# Patient Record
Sex: Female | Born: 1960
Health system: Southern US, Community
[De-identification: ages and names within clinical notes are randomized; demographics above are authoritative.]

## PROBLEM LIST (undated history)

## (undated) ENCOUNTER — Emergency Department (HOSPITAL_COMMUNITY): Payer: Medicaid Other | Source: Home / Self Care

## (undated) DIAGNOSIS — E0590 Thyrotoxicosis, unspecified without thyrotoxic crisis or storm: Secondary | ICD-10-CM

## (undated) DIAGNOSIS — D219 Benign neoplasm of connective and other soft tissue, unspecified: Secondary | ICD-10-CM

## (undated) DIAGNOSIS — E7800 Pure hypercholesterolemia, unspecified: Secondary | ICD-10-CM

## (undated) DIAGNOSIS — I10 Essential (primary) hypertension: Secondary | ICD-10-CM

## (undated) DIAGNOSIS — Z9289 Personal history of other medical treatment: Secondary | ICD-10-CM

## (undated) DIAGNOSIS — Z9109 Other allergy status, other than to drugs and biological substances: Secondary | ICD-10-CM

## (undated) HISTORY — DX: Personal history of other medical treatment: Z92.89

---

## 1999-08-25 ENCOUNTER — Encounter: Payer: Self-pay | Admitting: Obstetrics & Gynecology

## 1999-08-25 ENCOUNTER — Inpatient Hospital Stay (HOSPITAL_COMMUNITY): Admission: AD | Admit: 1999-08-25 | Discharge: 1999-08-25 | Payer: Self-pay | Admitting: *Deleted

## 1999-08-30 ENCOUNTER — Inpatient Hospital Stay (HOSPITAL_COMMUNITY): Admission: AD | Admit: 1999-08-30 | Discharge: 1999-08-30 | Payer: Self-pay | Admitting: *Deleted

## 1999-09-25 ENCOUNTER — Ambulatory Visit (HOSPITAL_COMMUNITY): Admission: RE | Admit: 1999-09-25 | Discharge: 1999-09-25 | Payer: Self-pay | Admitting: *Deleted

## 1999-12-06 ENCOUNTER — Inpatient Hospital Stay (HOSPITAL_COMMUNITY): Admission: AD | Admit: 1999-12-06 | Discharge: 1999-12-06 | Payer: Self-pay | Admitting: *Deleted

## 2000-01-16 ENCOUNTER — Inpatient Hospital Stay (HOSPITAL_COMMUNITY): Admission: AD | Admit: 2000-01-16 | Discharge: 2000-01-16 | Payer: Self-pay | Admitting: *Deleted

## 2000-02-20 ENCOUNTER — Inpatient Hospital Stay (HOSPITAL_COMMUNITY): Admission: AD | Admit: 2000-02-20 | Discharge: 2000-02-23 | Payer: Self-pay | Admitting: *Deleted

## 2000-07-21 ENCOUNTER — Inpatient Hospital Stay (HOSPITAL_COMMUNITY): Admission: AD | Admit: 2000-07-21 | Discharge: 2000-07-21 | Payer: Self-pay | Admitting: *Deleted

## 2001-11-26 ENCOUNTER — Observation Stay (HOSPITAL_COMMUNITY): Admission: AD | Admit: 2001-11-26 | Discharge: 2001-11-26 | Payer: Self-pay | Admitting: Obstetrics

## 2001-11-26 ENCOUNTER — Encounter (INDEPENDENT_AMBULATORY_CARE_PROVIDER_SITE_OTHER): Payer: Self-pay

## 2002-09-07 ENCOUNTER — Encounter: Payer: Self-pay | Admitting: General Practice

## 2002-09-07 ENCOUNTER — Encounter: Admission: RE | Admit: 2002-09-07 | Discharge: 2002-09-07 | Payer: Self-pay | Admitting: General Practice

## 2003-01-10 ENCOUNTER — Emergency Department (HOSPITAL_COMMUNITY): Admission: EM | Admit: 2003-01-10 | Discharge: 2003-01-10 | Payer: Self-pay | Admitting: Emergency Medicine

## 2003-01-10 ENCOUNTER — Encounter: Payer: Self-pay | Admitting: Emergency Medicine

## 2003-04-07 ENCOUNTER — Emergency Department (HOSPITAL_COMMUNITY): Admission: EM | Admit: 2003-04-07 | Discharge: 2003-04-07 | Payer: Self-pay | Admitting: Emergency Medicine

## 2003-04-19 ENCOUNTER — Encounter: Payer: Self-pay | Admitting: Obstetrics and Gynecology

## 2003-04-19 ENCOUNTER — Ambulatory Visit (HOSPITAL_COMMUNITY): Admission: RE | Admit: 2003-04-19 | Discharge: 2003-04-19 | Payer: Self-pay | Admitting: Obstetrics and Gynecology

## 2003-05-04 ENCOUNTER — Other Ambulatory Visit: Admission: RE | Admit: 2003-05-04 | Discharge: 2003-05-04 | Payer: Self-pay | Admitting: Obstetrics and Gynecology

## 2004-05-04 ENCOUNTER — Other Ambulatory Visit: Admission: RE | Admit: 2004-05-04 | Discharge: 2004-05-04 | Payer: Self-pay | Admitting: Obstetrics and Gynecology

## 2004-05-16 ENCOUNTER — Ambulatory Visit (HOSPITAL_COMMUNITY): Admission: RE | Admit: 2004-05-16 | Discharge: 2004-05-16 | Payer: Self-pay | Admitting: Obstetrics and Gynecology

## 2004-08-05 ENCOUNTER — Emergency Department (HOSPITAL_COMMUNITY): Admission: EM | Admit: 2004-08-05 | Discharge: 2004-08-05 | Payer: Self-pay | Admitting: Emergency Medicine

## 2005-03-15 ENCOUNTER — Emergency Department (HOSPITAL_COMMUNITY): Admission: EM | Admit: 2005-03-15 | Discharge: 2005-03-15 | Payer: Self-pay | Admitting: Family Medicine

## 2005-05-25 ENCOUNTER — Ambulatory Visit (HOSPITAL_COMMUNITY): Admission: RE | Admit: 2005-05-25 | Discharge: 2005-05-25 | Payer: Self-pay | Admitting: Internal Medicine

## 2005-07-04 ENCOUNTER — Emergency Department (HOSPITAL_COMMUNITY): Admission: EM | Admit: 2005-07-04 | Discharge: 2005-07-04 | Payer: Self-pay | Admitting: Emergency Medicine

## 2006-03-02 ENCOUNTER — Inpatient Hospital Stay (HOSPITAL_COMMUNITY): Admission: AD | Admit: 2006-03-02 | Discharge: 2006-03-03 | Payer: Self-pay | Admitting: Obstetrics and Gynecology

## 2006-06-25 ENCOUNTER — Ambulatory Visit: Payer: Self-pay | Admitting: Family Medicine

## 2006-06-26 ENCOUNTER — Ambulatory Visit: Payer: Self-pay | Admitting: *Deleted

## 2006-07-26 ENCOUNTER — Ambulatory Visit: Payer: Self-pay | Admitting: Family Medicine

## 2006-09-13 ENCOUNTER — Ambulatory Visit: Payer: Self-pay | Admitting: Family Medicine

## 2007-04-01 ENCOUNTER — Ambulatory Visit: Payer: Self-pay | Admitting: Family Medicine

## 2007-04-04 ENCOUNTER — Ambulatory Visit (HOSPITAL_COMMUNITY): Admission: RE | Admit: 2007-04-04 | Discharge: 2007-04-04 | Payer: Self-pay | Admitting: Family Medicine

## 2007-04-08 ENCOUNTER — Encounter (INDEPENDENT_AMBULATORY_CARE_PROVIDER_SITE_OTHER): Payer: Self-pay | Admitting: Family Medicine

## 2007-07-23 ENCOUNTER — Encounter (INDEPENDENT_AMBULATORY_CARE_PROVIDER_SITE_OTHER): Payer: Self-pay | Admitting: *Deleted

## 2007-09-18 DIAGNOSIS — E0590 Thyrotoxicosis, unspecified without thyrotoxic crisis or storm: Secondary | ICD-10-CM | POA: Insufficient documentation

## 2007-09-18 DIAGNOSIS — E785 Hyperlipidemia, unspecified: Secondary | ICD-10-CM | POA: Insufficient documentation

## 2007-09-18 DIAGNOSIS — Z794 Long term (current) use of insulin: Secondary | ICD-10-CM

## 2007-09-18 DIAGNOSIS — I10 Essential (primary) hypertension: Secondary | ICD-10-CM

## 2007-09-18 DIAGNOSIS — E119 Type 2 diabetes mellitus without complications: Secondary | ICD-10-CM

## 2008-12-29 ENCOUNTER — Ambulatory Visit: Payer: Self-pay | Admitting: Family Medicine

## 2008-12-29 DIAGNOSIS — I10 Essential (primary) hypertension: Secondary | ICD-10-CM | POA: Insufficient documentation

## 2008-12-29 LAB — CONVERTED CEMR LAB
Blood Glucose, Fingerstick: 147
Hgb A1c MFr Bld: 7.7 %

## 2009-01-03 ENCOUNTER — Encounter (INDEPENDENT_AMBULATORY_CARE_PROVIDER_SITE_OTHER): Payer: Self-pay | Admitting: Family Medicine

## 2009-01-03 ENCOUNTER — Ambulatory Visit: Payer: Self-pay | Admitting: Internal Medicine

## 2009-01-03 LAB — CONVERTED CEMR LAB
Basophils Absolute: 0 10*3/uL (ref 0.0–0.1)
Basophils Relative: 0 % (ref 0–1)
CO2: 22 meq/L (ref 19–32)
Calcium: 10.2 mg/dL (ref 8.4–10.5)
Chloride: 105 meq/L (ref 96–112)
Eosinophils Absolute: 0.1 10*3/uL (ref 0.0–0.7)
Eosinophils Relative: 1 % (ref 0–5)
Glucose, Bld: 139 mg/dL — ABNORMAL HIGH (ref 70–99)
HCT: 41.4 % (ref 36.0–46.0)
Lymphocytes Relative: 47 % — ABNORMAL HIGH (ref 12–46)
MCV: 77.5 fL — ABNORMAL LOW (ref 78.0–100.0)
Monocytes Absolute: 0.5 10*3/uL (ref 0.1–1.0)
Monocytes Relative: 8 % (ref 3–12)
Neutro Abs: 2.8 10*3/uL (ref 1.7–7.7)
Sodium: 141 meq/L (ref 135–145)
TSH: <0.004 microintl units/mL — ABNORMAL LOW (ref 0.350–4.50)
WBC: 6.4 10*3/uL (ref 4.0–10.5)

## 2009-01-06 ENCOUNTER — Ambulatory Visit: Payer: Self-pay | Admitting: Family Medicine

## 2009-01-20 ENCOUNTER — Ambulatory Visit: Payer: Self-pay | Admitting: Nurse Practitioner

## 2009-02-01 ENCOUNTER — Encounter (INDEPENDENT_AMBULATORY_CARE_PROVIDER_SITE_OTHER): Payer: Self-pay | Admitting: Family Medicine

## 2009-02-11 ENCOUNTER — Encounter (INDEPENDENT_AMBULATORY_CARE_PROVIDER_SITE_OTHER): Payer: Self-pay | Admitting: Family Medicine

## 2009-03-08 ENCOUNTER — Emergency Department (HOSPITAL_COMMUNITY): Admission: EM | Admit: 2009-03-08 | Discharge: 2009-03-08 | Payer: Self-pay | Admitting: Emergency Medicine

## 2009-03-08 ENCOUNTER — Telehealth (INDEPENDENT_AMBULATORY_CARE_PROVIDER_SITE_OTHER): Payer: Self-pay | Admitting: Family Medicine

## 2009-12-29 ENCOUNTER — Telehealth: Payer: Self-pay | Admitting: Physician Assistant

## 2010-01-16 ENCOUNTER — Ambulatory Visit: Payer: Self-pay | Admitting: Physician Assistant

## 2010-01-16 ENCOUNTER — Ambulatory Visit (HOSPITAL_COMMUNITY): Admission: RE | Admit: 2010-01-16 | Discharge: 2010-01-16 | Payer: Self-pay | Admitting: Physician Assistant

## 2010-01-16 DIAGNOSIS — F329 Major depressive disorder, single episode, unspecified: Secondary | ICD-10-CM

## 2010-01-16 DIAGNOSIS — E049 Nontoxic goiter, unspecified: Secondary | ICD-10-CM | POA: Insufficient documentation

## 2010-01-16 LAB — CONVERTED CEMR LAB: Blood Glucose, Fingerstick: 159

## 2010-01-17 ENCOUNTER — Encounter: Payer: Self-pay | Admitting: Physician Assistant

## 2010-01-17 DIAGNOSIS — E041 Nontoxic single thyroid nodule: Secondary | ICD-10-CM | POA: Insufficient documentation

## 2010-01-17 LAB — CONVERTED CEMR LAB
Albumin: 4.2 g/dL (ref 3.5–5.2)
Alkaline Phosphatase: 96 units/L (ref 39–117)
BUN: 13 mg/dL (ref 6–23)
Basophils Absolute: 0 10*3/uL (ref 0.0–0.1)
Basophils Relative: 0 % (ref 0–1)
CO2: 22 meq/L (ref 19–32)
Calcium: 10.4 mg/dL (ref 8.4–10.5)
Chloride: 104 meq/L (ref 96–112)
Eosinophils Absolute: 0.1 10*3/uL (ref 0.0–0.7)
Hemoglobin: 13.8 g/dL (ref 12.0–15.0)
Hgb A1c MFr Bld: 8.1 % — ABNORMAL HIGH (ref 4.6–6.1)
Lymphocytes Relative: 38 % (ref 12–46)
Lymphs Abs: 2.7 10*3/uL (ref 0.7–4.0)
MCHC: 31.3 g/dL (ref 30.0–36.0)
MCV: 78.5 fL (ref 78.0–100.0)
Microalb, Ur: 7.02 mg/dL — ABNORMAL HIGH (ref 0.00–1.89)
Monocytes Relative: 8 % (ref 3–12)
Neutro Abs: 3.7 10*3/uL (ref 1.7–7.7)
Potassium: 4.6 meq/L (ref 3.5–5.3)
RBC: 5.62 M/uL — ABNORMAL HIGH (ref 3.87–5.11)
T3, Free: 7.6 pg/mL — ABNORMAL HIGH (ref 2.3–4.2)
WBC: 7.1 10*3/uL (ref 4.0–10.5)

## 2010-01-23 ENCOUNTER — Ambulatory Visit: Payer: Self-pay | Admitting: Physician Assistant

## 2010-01-26 ENCOUNTER — Encounter (INDEPENDENT_AMBULATORY_CARE_PROVIDER_SITE_OTHER): Payer: Self-pay | Admitting: *Deleted

## 2010-01-26 ENCOUNTER — Ambulatory Visit: Payer: Self-pay | Admitting: Physician Assistant

## 2010-01-30 ENCOUNTER — Ambulatory Visit: Payer: Self-pay | Admitting: Physician Assistant

## 2010-01-30 DIAGNOSIS — K219 Gastro-esophageal reflux disease without esophagitis: Secondary | ICD-10-CM | POA: Insufficient documentation

## 2010-01-30 LAB — CONVERTED CEMR LAB: Blood Glucose, Fingerstick: 127

## 2010-01-31 ENCOUNTER — Encounter (HOSPITAL_COMMUNITY): Admission: RE | Admit: 2010-01-31 | Discharge: 2010-04-05 | Payer: Self-pay | Admitting: Internal Medicine

## 2010-02-09 ENCOUNTER — Encounter: Payer: Self-pay | Admitting: Physician Assistant

## 2010-02-20 ENCOUNTER — Ambulatory Visit: Payer: Self-pay | Admitting: Physician Assistant

## 2010-02-20 DIAGNOSIS — F411 Generalized anxiety disorder: Secondary | ICD-10-CM | POA: Insufficient documentation

## 2010-03-17 ENCOUNTER — Encounter: Payer: Self-pay | Admitting: Physician Assistant

## 2010-03-27 ENCOUNTER — Encounter: Payer: Self-pay | Admitting: Physician Assistant

## 2010-03-27 ENCOUNTER — Telehealth: Payer: Self-pay | Admitting: Physician Assistant

## 2010-04-06 ENCOUNTER — Encounter (INDEPENDENT_AMBULATORY_CARE_PROVIDER_SITE_OTHER): Payer: Self-pay | Admitting: *Deleted

## 2010-04-10 ENCOUNTER — Ambulatory Visit: Payer: Self-pay | Admitting: Physician Assistant

## 2010-04-10 LAB — CONVERTED CEMR LAB: Blood Glucose, Fingerstick: 161

## 2010-04-12 LAB — CONVERTED CEMR LAB: Free T4: 1.94 ng/dL — ABNORMAL HIGH (ref 0.80–1.80)

## 2010-04-26 ENCOUNTER — Telehealth: Payer: Self-pay | Admitting: Physician Assistant

## 2010-04-28 ENCOUNTER — Encounter: Payer: Self-pay | Admitting: Physician Assistant

## 2010-05-10 ENCOUNTER — Telehealth (INDEPENDENT_AMBULATORY_CARE_PROVIDER_SITE_OTHER): Payer: Self-pay | Admitting: *Deleted

## 2010-05-10 ENCOUNTER — Ambulatory Visit: Payer: Self-pay | Admitting: Physician Assistant

## 2010-05-10 DIAGNOSIS — R002 Palpitations: Secondary | ICD-10-CM

## 2010-05-11 LAB — CONVERTED CEMR LAB
CO2: 22 meq/L (ref 19–32)
Calcium: 10.6 mg/dL — ABNORMAL HIGH (ref 8.4–10.5)
Cholesterol: 229 mg/dL — ABNORMAL HIGH (ref 0–200)
Free T4: 1.83 ng/dL — ABNORMAL HIGH (ref 0.80–1.80)
LDL Cholesterol: 161 mg/dL — ABNORMAL HIGH (ref 0–99)
Potassium: 4.1 meq/L (ref 3.5–5.3)
TSH: <0.004 microintl units/mL — ABNORMAL LOW (ref 0.350–4.500)
VLDL: 20 mg/dL (ref 0–40)

## 2010-05-15 ENCOUNTER — Telehealth: Payer: Self-pay | Admitting: Physician Assistant

## 2010-05-15 ENCOUNTER — Ambulatory Visit: Payer: Self-pay | Admitting: Physician Assistant

## 2010-05-15 DIAGNOSIS — R079 Chest pain, unspecified: Secondary | ICD-10-CM

## 2010-05-15 LAB — CONVERTED CEMR LAB: Blood Glucose, Fingerstick: 170

## 2010-05-17 ENCOUNTER — Telehealth: Payer: Self-pay | Admitting: Physician Assistant

## 2010-05-27 ENCOUNTER — Encounter: Payer: Self-pay | Admitting: Physician Assistant

## 2010-05-29 ENCOUNTER — Ambulatory Visit: Payer: Self-pay | Admitting: Physician Assistant

## 2010-06-09 ENCOUNTER — Encounter: Payer: Self-pay | Admitting: Physician Assistant

## 2010-06-09 LAB — CONVERTED CEMR LAB
ALT: 17 units/L
Alkaline Phosphatase: 87 units/L
BUN: 8 mg/dL
CO2: 26 meq/L
Chloride: 106 meq/L
Free T4: 1.4 ng/dL
GFR calc Af Amer: >60 mL/min
GFR calc non Af Amer: >60 mL/min
Glucose, Bld: 157 mg/dL
Potassium: 4.3 meq/L
Sodium: 141 meq/L
Total Bilirubin: 0.7 mg/dL

## 2010-06-14 ENCOUNTER — Ambulatory Visit: Payer: Self-pay | Admitting: Physician Assistant

## 2010-06-15 ENCOUNTER — Telehealth: Payer: Self-pay | Admitting: Physician Assistant

## 2010-06-15 LAB — CONVERTED CEMR LAB
Free T4: 1.82 ng/dL — ABNORMAL HIGH (ref 0.80–1.80)
Hemoglobin: 13.3 g/dL (ref 12.0–15.0)
Lymphocytes Relative: 44 % (ref 12–46)
Monocytes Relative: 9 % (ref 3–12)
Neutro Abs: 3.5 10*3/uL (ref 1.7–7.7)
Platelets: 333 10*3/uL (ref 150–400)
RDW: 14.3 % (ref 11.5–15.5)

## 2010-06-28 ENCOUNTER — Telehealth: Payer: Self-pay | Admitting: Physician Assistant

## 2010-07-04 ENCOUNTER — Encounter (INDEPENDENT_AMBULATORY_CARE_PROVIDER_SITE_OTHER): Payer: Self-pay | Admitting: *Deleted

## 2010-07-05 ENCOUNTER — Ambulatory Visit: Payer: Self-pay | Admitting: Physician Assistant

## 2010-07-05 ENCOUNTER — Telehealth: Payer: Self-pay | Admitting: Physician Assistant

## 2010-07-05 LAB — CONVERTED CEMR LAB: Blood Glucose, Fingerstick: 152

## 2010-07-07 ENCOUNTER — Encounter: Payer: Self-pay | Admitting: Physician Assistant

## 2010-10-18 ENCOUNTER — Telehealth (INDEPENDENT_AMBULATORY_CARE_PROVIDER_SITE_OTHER): Payer: Self-pay | Admitting: Internal Medicine

## 2010-10-31 ENCOUNTER — Encounter (INDEPENDENT_AMBULATORY_CARE_PROVIDER_SITE_OTHER): Payer: Self-pay | Admitting: Internal Medicine

## 2010-12-01 ENCOUNTER — Encounter: Payer: Self-pay | Admitting: Physician Assistant

## 2010-12-05 ENCOUNTER — Encounter (INDEPENDENT_AMBULATORY_CARE_PROVIDER_SITE_OTHER): Payer: Self-pay | Admitting: Internal Medicine

## 2010-12-05 ENCOUNTER — Ambulatory Visit: Admit: 2010-12-05 | Payer: Self-pay | Admitting: Internal Medicine

## 2010-12-05 ENCOUNTER — Encounter: Payer: Self-pay | Admitting: Internal Medicine

## 2010-12-05 ENCOUNTER — Telehealth (INDEPENDENT_AMBULATORY_CARE_PROVIDER_SITE_OTHER): Payer: Self-pay | Admitting: Internal Medicine

## 2010-12-05 DIAGNOSIS — E0520 Thyrotoxicosis with toxic multinodular goiter without thyrotoxic crisis or storm: Secondary | ICD-10-CM | POA: Insufficient documentation

## 2010-12-05 DIAGNOSIS — K089 Disorder of teeth and supporting structures, unspecified: Secondary | ICD-10-CM | POA: Insufficient documentation

## 2010-12-05 LAB — CONVERTED CEMR LAB: T3, Free: 8.8 pg/mL — ABNORMAL HIGH (ref 2.3–4.2)

## 2010-12-05 NOTE — Progress Notes (Signed)
Summary: Office Visit//DEPRESSION SCREENNING  Office Visit//DEPRESSION SCREENNING   Imported By: Arta Bruce 03/20/2010 16:28:46  _____________________________________________________________________  External Attachment:    Type:   Image     Comment:   External Document

## 2010-12-05 NOTE — Miscellaneous (Signed)
Summary: [facility]:  L eye partially assessable  Clinical Lists Changes  Observations: Added new observation of DIAB EYE EX: Left Eye Partially Assessable on [facility] (01/23/2010 23:02)

## 2010-12-05 NOTE — Letter (Signed)
Summary: REFERRAL//PSYCHOLOGY//APPT DATE & TIME  REFERRAL//PSYCHOLOGY//APPT DATE & TIME   Imported By: Arta Bruce 03/15/2010 14:47:08  _____________________________________________________________________  External Attachment:    Type:   Image     Comment:   External Document

## 2010-12-05 NOTE — Miscellaneous (Signed)
Summary: labs from [facility]  Clinical Lists Changes  Observations: Added new observation of TSH: 0.016 microintl units/mL (06/09/2010 15:31) Added new observation of T4, FREE: 1.4 ng/dL (47/82/9562 13:08) Added new observation of SGPT (ALT): 17 units/L (06/09/2010 15:31) Added new observation of SGOT (AST): 18 units/L (06/09/2010 15:31) Added new observation of ALK PHOS: 87 units/L (06/09/2010 15:31) Added new observation of BILI TOTAL: 0.7 mg/dL (65/78/4696 29:52) Added new observation of GFRAA: >60 mL/min (06/09/2010 15:31) Added new observation of GFR: >60 mL/min (06/09/2010 15:31) Added new observation of CREATININE: 0.6 mg/dL (84/13/2440 10:27) Added new observation of BUN: 8 mg/dL (25/36/6440 34:74) Added new observation of BG RANDOM: 157 mg/dL (25/95/6387 56:43) Added new observation of ANION GAP: 9  (06/09/2010 15:31) Added new observation of CO2 PLSM/SER: 26 meq/L (06/09/2010 15:31) Added new observation of CL SERUM: 106 meq/L (06/09/2010 15:31) Added new observation of K SERUM: 4.3 meq/L (06/09/2010 15:31) Added new observation of NA: 141 meq/L (06/09/2010 15:31)

## 2010-12-05 NOTE — Progress Notes (Signed)
  Phone Note Call from Patient   Summary of Call: pt says she needs a glucose meter, strips and lacets sent to [facility] pharmacy  Initial call taken by: [name],  July 05, 2010 3:21 PM  Follow-up for Phone Call        pt is aware Follow-up by: [name],  July 06, 2010 4:16 PM    New/Updated Medications: * GLUCOSE METER check sugars once daily * GLUCOSE METER STRIPS check sugar once daily * LANCETS check sugar once daily Prescriptions: LANCETS check sugar once daily  #30 x 11   Entered and Authorized by:   Tereso Newcomer PA-C   Signed by:   Tereso Newcomer PA-C on 07/05/2010   Method used:   Faxed to ...       [facility] (retail)       [address] [place], [place]  [address]       Ph: [phone]       Fax: [phone]   RxID:   [id number] GLUCOSE METER STRIPS check sugar once daily  #30 x 11   Entered and Authorized by:   Tereso Newcomer PA-C   Signed by:   Tereso Newcomer PA-C on 07/05/2010   Method used:   Faxed to ...       [facility] - [facility] (retail)       [address] [place], [place]  [address]       Ph: [phone]       Fax: [phone]   RxID:   [id number] GLUCOSE METER check sugars once daily  #1 x 0   Entered and Authorized by:   Tereso Newcomer PA-C   Signed by:   Tereso Newcomer PA-C on 07/05/2010   Method used:   Faxed to ...       [facility] - [facility] (retail)       [address] [place], [place]  [address]       Ph: [phone]       Fax: [phone]   RxID:   [id number]

## 2010-12-05 NOTE — Letter (Signed)
[facility]   Imported By: Arta Bruce 06/29/2010 11:42:17  _____________________________________________________________________  External Attachment:    Type:   Image     Comment:   External Document

## 2010-12-05 NOTE — Assessment & Plan Note (Signed)
Summary: Chest Wall Pain  Nurse Visit   Vital Signs:  Patient profile:   50 year old female Pulse rate:   66 / minute Pulse rhythm:   regular Resp:     24 per minute BP sitting:   154 / 96  (left arm)  Review of Systems       States has edema when she is on her feet a lot. General:  Complains of fatigue, malaise, sleep disorder, and sweats; Feels "like I've been running hot.". CV:  Complains of chest pain or discomfort and fatigue.  CC: Recurring chest pain Is Patient Diabetic? Yes Did you bring your meter with you today? No, is broken Pain Assessment Patient in pain? yes     Location: chest Intensity: 5 Type: aching Onset of pain  when she takes in a deep breath, started Friday night CBG Result 170 CBG Device ID Presto  Does patient need assistance? Functional Status Self care Ambulation Normal   Primary Care Provider:  Tereso Newcomer PA-C  CC:  Recurring chest pain.  History of Present Illness: Started Friday night, was in bed and felt an aching across right chest to left to mid-chest, did not radiate further.  Denies dizziness, can't remember if she was SOB.  Pain eased up, then she went to sleep.  Later that night, pain started again, same type of pain, same place.  Saturday morning, it "was a doozy", same type of pain, but stronger and in the same area.  Unsure of SOB due to intensity of pain -- 9 1/2 on pain scale.  Moving right arm increased pain. Did not seek treatment, thinking she might have strained a muscle.  As above per RN. She dev. right chest pain Sat night at rest.  Exertion actually makes better.  Notes pleuritic symptoms.  No syncope.  No dyspnea.  No assoc nausea or diaph.  NO radiating pain.  No cough.  Pain worse with arm movements and changes in positioning.  Better today.  Has not taken anything.  NO palps.  No indigestion.  No dysphagia.   Physical Exam  General:  alert, well-developed, well-nourished, and overweight-appearing.   Head:   normocephalic.   Mouth:  pharynx pink and moist.   Neck:  thyromegaly.  no jvd Lungs:  normal breath sounds, no crackles, and no wheezes.   Heart:  normal rate, regular rhythm, and no murmur.   Abdomen:  soft, non-tender, and no hepatomegaly.   Extremities:  no edema Neurologic:  alert & oriented X3 and cranial nerves II-XII intact.   Psych:  normally interactive.     Impression & Recommendations:  Problem # 1:  CHEST PAIN UNSPECIFIED (ICD-786.50) Assessment New  she seems to be describing chest wall pain however, with her risk factors, will check some labs today ECG without change check Trop I and DDimer if DDimer abnl:  send to ED for chest CT if Trop abnormal:  send to ED for serial markers and admxn if warranted if above normal; treast chest wall pain and follow up if no better or go to ED if worse  Orders: EKG w/ Interpretation (93000) T-Troponin I ([id number]) D-Dimer- [facility] ([id number])  Complete Medication List: 1)  Allegra 180 Mg Tabs (Fexofenadine hcl) .... Take one (1) tablet daily as needed for allergies 2)  Aspir-low 81 Mg Tbec (Aspirin) .... Take one (1) tablet each day 3)  Crestor 10 Mg Tabs (Rosuvastatin calcium) .... Take one (1) tablet by mouth every day 4)  Diovan 320  Mg Tabs (Valsartan) .... Take one (1) tablet each day 5)  Hydrochlorothiazide 25 Mg Tabs (Hydrochlorothiazide) .... Take one (1) tablet every morning 6)  Metformin Hcl 500 Mg Tb24 (Metformin hcl) .... Take 1 tablet by mouth two times a day 7)  Nasacort Aq 55 Mcg/act Aers (Triamcinolone acetonide(nasal)) .... Use one spray in each nostril once daily 8)  Norvasc 10 Mg Tabs (Amlodipine besylate) .... Take 1 tablet by mouth every morning 9)  Lopressor 50 Mg Tabs (Metoprolol tartrate) .... Take 2  tabs by mouth two times a day  for blood pressure (pharmacy note dose change) 10)  Flonase 50 Mcg/act Susp (Fluticasone propionate) .Marland Kitchen.. 1-2 sprays each nostril once daily (use for 3 weeks, then stop for  one week) 11)  Pepcid 20 Mg Tabs (Famotidine) .... Take 1 tablet by mouth two times a day 12)  Methimazole 10 Mg Tabs (Methimazole) .... Take 2 tablets by mouth three times a day 13)  Alprazolam 0.25 Mg Tabs (Alprazolam) .... Take 1 by mouth once daily as needed for anxiety 14)  Clonidine Hcl 0.2 Mg Tabs (Clonidine hcl) .... 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) 5)  Gerd  (ICD-530.81) 6)  Thyroid Nodule  (ICD-241.0) 7)  Depression  (ICD-311) 8)  Thyromegaly  (ICD-240.9) 9)  Family History Diabetes 1st Degree Relative  (ICD-V18.0) 10)  Hypertension  (ICD-401.9) 11)  Hyperthyroidism  (ICD-242.90) 12)  Allergic Rhinitis, Seasonal  (ICD-477.0) 13)  Hyperlipidemia  (ICD-272.4) 14)  Essential Hypertension, Benign  (ICD-401.1) 15)  Diabetes Mellitus, Type II  (ICD-250.00)  Allergies: No Known Drug Allergies  Past History:  Past Medical History: Last updated: 01/16/2010 Diabetes mellitus, type II Hypertension Hyperlipidemia left rotator cuff syndrome   Patient Instructions: 1)  We will call you about test results. 2)  Put heat on your chest two times a day for the next 2-3 days. 3)  Take Tylenol 500 mg 1-2 tabs every 6 hours as needed for pain. 4)  You may take Ibuprofen 200 mg 1-2 tabs every 12 hours as needed for the next 2-3 days if needed. 5)  Schedule follow up if pain not gone in the next 7-10 days. 6)  Go to the ED if worse.   Family History: Reviewed history from 01/16/2010 and no changes required. Family History Diabetes 1st degree relative - mom Heart rhythm issue (dad) Family History Hypertension - dad  Social History: Reviewed history from 01/16/2010 and no changes required. 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 Patient Level III [[address]] 4)  T-Troponin I [[id number]] 5)  D-Dimer- [facility] [[id number]]   EKG  Procedure date:  05/15/2010  Findings:      Normal sinus rhythm with rate of:  62 normal axis nonspecific STTW changes no change since prior tracing 05/10/2010   Appended Document: Chest Wall Pain Patient: Andrea [name] Note: All result statuses are Final unless otherwise noted.  Tests: (  1) Fibrin Derivatives D-Dimer (14782)  Fibrin Derivatives D-Dimer                        [H]  0.51 ug/mL-FEU              0.00-0.48     At the inhouse established cutoff value of 0.48 ug/mL FEU, this     methology has been documented in the literature to have a sensitivity     and negative predictive value of at least 98-99%.  The test result     should be correlated with an assessment of the clinical probability of     DVT/VTE.  Note: An exclamation mark (!) indicates a result that was not dispersed into the flowsheet. Document Creation Date: 05/16/2010 8:08 AM _______________________________________________________________________  (1) Order result status: Final Collection or observation date-time: 05/15/2010 21:54 Requested date-time: 05/15/2010 16:13 Receipt date-time: 05/15/2010 21:54 Reported date-time: 05/16/2010 08:10 Referring Physician:   Ordering Physician:  Alben Spittle [phone]) Specimen Source:  Source: Lajean Silvius Order Number: [id number] Lab site: [facility]     [address], [address]     [place]  [place]  [address]   -----------------  The following lab values were dispersed to the flowsheet with no units conversion:    Fibrin  Derivatives D-Dimer, 0.51 UG/ML-FEU, (F)  expected units: ug/mL FEU   Signed by Tereso Newcomer PA-C on 05/16/2010 at 9:10 AM  ________________________________________________________________________ low clinical suspicion for PE feel like this is essentially a negative result no further w/u necessary at this time Trop neg discussed lab results with patient Tereso Newcomer PA-C  May 16, 2010 9:10 AM    Clinical Lists Changes  Problems: Assessed CHEST PAIN UNSPECIFIED as comment only - DDimer 0.51 (cut off 0.48) low clinical suspicion feel this is essentially neg.  Trop neg  cont tx for chest wall pain         Impression & Recommendations:  Problem # 1:  CHEST PAIN UNSPECIFIED (ICD-786.50) DDimer 0.51 (cut off 0.48) low clinical suspicion feel this is essentially neg.  Trop neg  cont tx for chest wall pain  Complete Medication List: 1)  Allegra 180 Mg Tabs (Fexofenadine hcl) .... Take one (1) tablet daily as needed for allergies 2)  Aspir-low 81 Mg Tbec (Aspirin) .... Take one (1) tablet each day 3)  Crestor 10 Mg Tabs (Rosuvastatin calcium) .... Take one (1) tablet by mouth every day 4)  Diovan 320 Mg Tabs (Valsartan) .... Take one (1) tablet each day 5)  Hydrochlorothiazide 25 Mg Tabs (Hydrochlorothiazide) .... Take one (1) tablet every morning 6)  Metformin Hcl 500 Mg Tb24 (Metformin hcl) .... Take 1 tablet by mouth two times a day 7)  Nasacort Aq 55 Mcg/act Aers (Triamcinolone acetonide(nasal)) .... Use one spray in each nostril once daily 8)  Norvasc 10 Mg Tabs (Amlodipine besylate) .... Take 1 tablet by mouth every morning 9)  Lopressor 50 Mg Tabs (Metoprolol tartrate) .... Take 2  tabs by mouth two times a day  for blood pressure (pharmacy note dose change) 10)  Flonase 50 Mcg/act Susp (Fluticasone propionate) .Marland Kitchen.. 1-2 sprays each nostril once daily (use for 3 weeks, then stop for one week) 11)  Pepcid 20 Mg Tabs (Famotidine) .... Take 1 tablet by mouth two  times a day 12)  Methimazole 10 Mg Tabs (Methimazole) .... Take 2 tablets by mouth three times a day 13)  Alprazolam 0.25 Mg Tabs (Alprazolam) .... Take 1 by  mouth once daily as needed for anxiety 14)  Clonidine Hcl 0.2 Mg Tabs (Clonidine hcl) .... Take 1 tablet by mouth two times a day for blood pressure (pharmacy note dose increase)    Signed by Tereso Newcomer PA-C on 05/16/2010 at 9:13 AM

## 2010-12-05 NOTE — Letter (Signed)
Summary: *HSN Results Follow up  [facility]  [address] [place], [place] [address]   Phone: [phone]  Fax: [phone]      07/04/2010   AUDYN DIMERCURIO [address] [place], [place]  [address]   Dear  Ms. [name],                            ____S.Drinkard,FNP   ____D. Gore,FNP       ____B. McPherson,MD   ____V. Rankins,MD    ____E. Mulberry,MD    ____N. Daphine Deutscher, FNP  ____D. Reche Dixon, MD    ____K. Philipp Deputy, MD    ____Other     This letter is to inform you that your recent test(s):  _______Pap Smear    _______Lab Test     _______X-ray    _______ is within acceptable limits  _______ requires a medication change  _______ requires a follow-up lab visit  _______ requires a follow-up visit with your provider   Comments:  We have been trying to reach you.  Please give [place] a call at your earliest convenience.       _________________________________________________________ If you have any questions, please contact our office                     Sincerely,  [name] [facility]

## 2010-12-05 NOTE — Letter (Signed)
Summary: Work Excuse  [facility]  [address] [place], [place] [address]   Phone: [phone]  Fax: [phone]    Today's Date: January 26, 2010  Name of Patient: [name]  The above named patient had a medical visit today at: 10  am / pm.  Please take this into consideration when reviewing the time away from work/school.    Special Instructions:  [  ] None  [  ] To be off the remainder of today, returning to the normal work / school schedule tomorrow.  [  ] To be off until the next scheduled appointment on ______________________.  [ X ] Other __This patient is able to perform her job as usual and has no physcial limiations to her job____________________________________________________________ ________________________________________________________________________   Sincerely yours,   [name]

## 2010-12-05 NOTE — Assessment & Plan Note (Signed)
Summary: 6 WEEK FU ON DM/FASTING//KT   Vital Signs:  Patient profile:   50 year old female Height:      65 inches Weight:      284 pounds BMI:     47.43 Temp:     97.6 degrees F oral Pulse rate:   74 / minute Pulse rhythm:   regular Resp:     18 per minute BP sitting:   160 / 102  (left arm) Cuff size:   large  Vitals Entered By: [name] (April 10, 2010 10:23 AM)  Serial Vital Signs/Assessments:  Time      Position  BP       Pulse  Resp  Temp     By 11:11 AM            160/90                         Tereso Newcomer PA-C  CC: six week f/u..., Hypertension Management Is Patient Diabetic? Yes Pain Assessment Patient in pain? no      CBG Result 161  Does patient need assistance? Functional Status Self care Ambulation Normal   Primary Care Provider:  Tereso Newcomer PA-C  CC:  six week f/u... and Hypertension Management.  History of Present Illness: Here for f/u.  Unfortunately, daughter was murdered recently.  She had funeral yest.  Patient is doing ok.  Taking xanax still as needed for anxiety.  Still does not want to take Zoloft.  She has seen A. Vaughan in the past.  She would like to see her back.  She has missed some BP meds and other meds due to grieving.  Sugars have been running high and states BP today looks much better than it did last week.    DM:  A1C is now 7.5.  Looks much better.  Admits compliance with metformin.  Hyperthyroidism:  Sees endo. at [facility] 04/27/2010.  Now taking methimazole.  Denies palps, diarrhea, etc.  HTN:  As above.  Taking clonidine now.    Hypertension History:      She denies headache, chest pain, syncope, and side effects from treatment.        Positive major cardiovascular risk factors include diabetes, hyperlipidemia, and hypertension.  Negative major cardiovascular risk factors include female age less than 59 years old and non-tobacco-user status.     Current Medications (verified): 1)  Allegra 180 Mg Tabs (Fexofenadine Hcl)  .... Take One (1) Tablet Daily As Needed For Allergies 2)  Aspir-Low 81 Mg Tbec (Aspirin) .... Take One (1) Tablet Each Day 3)  Crestor 10 Mg Tabs (Rosuvastatin Calcium) .... Take One (1) Tablet By Mouth Every Day 4)  Diovan 320 Mg Tabs (Valsartan) .... Take One (1) Tablet Each Day 5)  Hydrochlorothiazide 25 Mg Tabs (Hydrochlorothiazide) .... Take One (1) Tablet Every Morning 6)  Metformin Hcl 500 Mg Tb24 (Metformin Hcl) .... Take Two (2) Tablets Each Morning 7)  Nasacort Aq 55 Mcg/act Aers (Triamcinolone Acetonide(Nasal)) .... Use One Spray in Each Nostril Once Daily 8)  Norvasc 10 Mg Tabs (Amlodipine Besylate) .... Take 1 Tablet By Mouth Every Morning 9)  Zoloft 50 Mg Tabs (Sertraline Hcl) .... Take 1 Tablet By Mouth Once A Day 10)  Lopressor 50 Mg Tabs (Metoprolol Tartrate) .... Take 2  Tabs By Mouth Two Times A Day  For Blood Pressure (Pharmacy Note Dose Change) 11)  Flonase 50 Mcg/act Susp (Fluticasone Propionate) .Marland Kitchen.. 1-2 Sprays Each  Nostril Once Daily (Use For 3 Weeks, Then Stop For One Week) 12)  Pepcid 20 Mg Tabs (Famotidine) .... Take 1 Tablet By Mouth Two Times A Day 13)  Methimazole 5 Mg Tabs (Methimazole) .... Take 1 Tablet By Mouth Once A Day For 3 Days, Then Increae To Take 1 Tablet By Mouth Two Times A Day 14)  Alprazolam 0.25 Mg Tabs (Alprazolam) .... Take 1 By Mouth Once Daily As Needed For Anxiety 15)  Clonidine Hcl 0.1 Mg Tabs (Clonidine Hcl) .... Take 1 Tablet By Mouth Two Times A Day For Blood Pressure  Allergies (verified): No Known Drug Allergies  Physical Exam  General:  alert, well-developed, and well-nourished.   Head:  normocephalic and atraumatic.   Lungs:  normal breath sounds.   Heart:  normal rate and regular rhythm.   Neurologic:  alert & oriented X3 and cranial nerves II-XII intact.   Psych:  normally interactive and good eye contact.     Impression & Recommendations:  Problem # 1:  HYPERTENSION (ICD-401.9) advised her to watch salt in diet missed  meds recently with death of daughter advised her to get back on track increase clonidine to 0.2 two times a day f/u in 1 month for blood pressure  Her updated medication list for this problem includes:    Diovan 320 Mg Tabs (Valsartan) .Marland Kitchen... Take one (1) tablet each day    Hydrochlorothiazide 25 Mg Tabs (Hydrochlorothiazide) .Marland Kitchen... Take one (1) tablet every morning    Norvasc 10 Mg Tabs (Amlodipine besylate) .Marland Kitchen... Take 1 tablet by mouth every morning    Lopressor 50 Mg Tabs (Metoprolol tartrate) .Marland Kitchen... Take 2  tabs by mouth two times a day  for blood pressure (pharmacy note dose change)    Clonidine Hcl 0.2 Mg Tabs (Clonidine hcl) .Marland Kitchen... Take 1 tablet by mouth two times a day for blood pressure (pharmacy note dose increase)  Problem # 2:  DIABETES MELLITUS, TYPE II (ICD-250.00)  A1C better try to change metformin to two times a day first could increase dose if stays above 7 f/u on DM in 3 mos  Her updated medication list for this problem includes:    Aspir-low 81 Mg Tbec (Aspirin) .Marland Kitchen... Take one (1) tablet each day    Diovan 320 Mg Tabs (Valsartan) .Marland Kitchen... Take one (1) tablet each day    Metformin Hcl 500 Mg Tb24 (Metformin hcl) .Marland Kitchen... 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 Take 2  tabs by mouth two times a day  for blood pressure (pharmacy note dose change)    Methimazole 5 Mg Tabs (Methimazole) .Marland Kitchen... Take 1 tablet by mouth once a day for 3 days, then increae to take 1 tablet by mouth two times a day  Orders: T-TSH (29528-41324) T-T4, Free [phone])  Problem # 4:  DEPRESSION (ICD-311) still not taking zoloft schedule f/u with LCSW with recent death of daughter if she  decides to take zoloft, she knows to schedule earlier f/u with me  Her updated medication list for this problem includes:    Zoloft 50 Mg Tabs (Sertraline hcl) .Marland Kitchen... Take 1 tablet by mouth once a day    Alprazolam 0.25 Mg Tabs (Alprazolam) .Marland Kitchen... Take  1 by mouth once daily as needed for anxiety  Complete Medication List: 1)  Allegra 180 Mg Tabs (Fexofenadine hcl) .... Take one (1) tablet daily as needed for allergies 2)  Aspir-low 81 Mg Tbec (Aspirin) .... Take one (1) tablet each day 3)  Crestor 10 Mg Tabs (Rosuvastatin calcium) .... Take one (1) tablet by mouth every day 4)  Diovan 320 Mg Tabs (Valsartan) .... Take one (1) tablet each day 5)  Hydrochlorothiazide 25 Mg Tabs (Hydrochlorothiazide) .... Take one (1) tablet every morning 6)  Metformin Hcl 500 Mg Tb24 (Metformin hcl) .... Take 1 tablet by mouth two times a day 7)  Nasacort Aq 55 Mcg/act Aers (Triamcinolone acetonide(nasal)) .... Use one spray in each nostril once daily 8)  Norvasc 10 Mg Tabs (Amlodipine besylate) .... Take 1 tablet by mouth every morning 9)  Zoloft 50 Mg Tabs (Sertraline hcl) .... Take 1 tablet by mouth once a day 10)  Lopressor 50 Mg Tabs (Metoprolol tartrate) .... Take 2  tabs by mouth two times a day  for blood pressure (pharmacy note dose change) 11)  Flonase 50 Mcg/act Susp (Fluticasone propionate) .Marland Kitchen.. 1-2 sprays each nostril once daily (use for 3 weeks, then stop for one week) 12)  Pepcid 20 Mg Tabs (Famotidine) .... Take 1 tablet by mouth two times a day 13)  Methimazole 5 Mg Tabs (Methimazole) .... Take 1 tablet by mouth once a day for 3 days, then increae to take 1 tablet by mouth two times a day 14)  Alprazolam 0.25 Mg Tabs (Alprazolam) .... Take 1 by mouth once daily as needed for anxiety 15)  Clonidine Hcl 0.2 Mg Tabs (Clonidine hcl) .... Take 1 tablet by mouth two times a day for blood pressure (pharmacy note dose increase)  Hypertension Assessment/Plan:      The patient's hypertensive risk group is  category C: Target organ damage and/or diabetes.  Today's blood pressure is 160/102.  Her blood pressure goal is < 130/80.  Patient Instructions: 1)  Schedule follow up with Ethelene Browns. 2)  Please schedule a follow-up appointment in 1 month with Shanna Un for blood pressure.  Come fasting so we can check cholesterol.  Do not eat or drink after midnight except water.  Go ahead and take your blood pressure medicines with water before coming to the appointment. 3)  Make a follow up appointment with me sooner if you decide to take the Zoloft. 4)  Make sure you go to the appointment at [facility] 04/27/2010. 5)  Change Metformin (Glucophage) to Take 1 tablet by mouth two times a day for diabetes. 6)  Increase Clonidine to 0.1 mg Take 2 tabs two times a day.  Your new prescription is for a higher dose . . . take that one as directed. 7)    Prescriptions: CLONIDINE HCL 0.2 MG TABS (CLONIDINE HCL) Take 1 tablet by mouth two times a day for blood pressure (pharmacy note dose increase)  #60 x 5   Entered and Authorized by:   Tereso Newcomer PA-C   Signed by:   Tereso Newcomer PA-C on 04/10/2010   Method used:   Faxed to ...       [facility] - Pharmac (retail)       [address] [place], [place]  [address]       Ph: [phone]       Fax: [phone]   RxID:   [id number]   Laboratory Results  Blood Tests     HGBA1C: 7.5%   (Normal Range: Non-Diabetic - 3-6%   Control Diabetic - 6-8%) CBG Random:: 161mg /dL

## 2010-12-05 NOTE — Letter (Signed)
Summary: TEST ORDER FORM/RADIOLOGY//APPT DATE & TIME  TEST ORDER FORM/RADIOLOGY//APPT DATE & TIME   Imported By: Arta Bruce 03/21/2010 11:01:14  _____________________________________________________________________  External Attachment:    Type:   Image     Comment:   External Document

## 2010-12-05 NOTE — Letter (Signed)
Summary: Out of Work  [facility]  [address] [place], [place] [address]   Phone: [phone]  Fax: [phone]    January 16, 2010   Employee:  DEVYNNE STURDIVANT    To Whom It May Concern:   For Medical reasons, please excuse the above named employee from work for the following dates:  Start:   01/16/2010  End:   01/18/2010  If you need additional information, please feel free to contact our office.         Sincerely,    Tereso Newcomer PA-C

## 2010-12-05 NOTE — Letter (Signed)
Summary: MAILED REQUESR=ED RECORDS TO [facility]//03/20/10  MAILED REQUESR=ED RECORDS TO [facility]//03/20/10   Imported By: Silvio Pate Stanislawscyk 03/17/2010 11:43:39  _____________________________________________________________________  External Attachment:    Type:   Image     Comment:   External Document

## 2010-12-05 NOTE — Progress Notes (Signed)
Summary: REFILLS  Phone Note Call from Patient Call back at [phone]   Reason for Call: Refill Medication Summary of Call: [facility] SAYS THAT SHE NEEDS HER HCTZ, NORVASC, METFORMIN,ALLEGRA, DIOVAN REFILLED. SHE IS IN THE PROCESS OF GETTING HER CARD RENEWED AT [facility]  Initial call taken by: Leodis Rains,  December 29, 2009 4:49 PM  Follow-up for Phone Call        forward to provider....  Follow-up by: [name],  December 29, 2009 5:05 PM  Additional Follow-up for Phone Call Additional follow up Details #1::        on your desk to fax needs f/u appt Additional Follow-up by: Brynda Rim,  January 03, 2010 5:31 PM    Additional Follow-up for Phone Call Additional follow up Details #2::    faxed...Marland KitchenMarland Kitchen pt is aware.Marland KitchenMarland KitchenMarland KitchenArmenia Shannon  January 04, 2010 10:57 AM   Prescriptions: NORVASC 10 MG TABS (AMLODIPINE BESYLATE) Take 1 tablet by mouth every morning  #30 x 0   Entered and Authorized by:   Tereso Newcomer PA-C   Signed by:   Tereso Newcomer PA-C on 01/03/2010   Method used:   Printed then faxed to ...       [facility] - Pharmac (retail)       [address] [place], [place]  [address]       Ph: [phone]       Fax: [phone]   RxID:   [id number] METFORMIN HCL 500 MG TB24 (METFORMIN HCL) TAKE TWO (2) TABLETS EACH MORNING  #60 x 0   Entered and Authorized by:   Tereso Newcomer PA-C   Signed by:   Tereso Newcomer PA-C on 01/03/2010   Method used:   Printed then faxed to ...       [facility] - Pharmac (retail)       [address] [place], [place]  [address]       Ph: [phone]       Fax: [phone]   RxID:   [id number] HYDROCHLOROTHIAZIDE 25 MG TABS (HYDROCHLOROTHIAZIDE) TAKE ONE (1) TABLET EVERY MORNING  #30 x 0   Entered and Authorized by:   Tereso Newcomer PA-C   Signed by:   Tereso Newcomer PA-C on 01/03/2010   Method used:   Printed then faxed to ...       [facility] - Pharmac (retail)       [address] [place], [place]  [address]       Ph: [phone]       Fax: [phone]   RxID:   [id number] DIOVAN 320 MG TABS (VALSARTAN) TAKE ONE (1) TABLET EACH DAY  #30 x 0   Entered and Authorized by:   Tereso Newcomer PA-C   Signed by:   Tereso Newcomer PA-C on 01/03/2010   Method used:   Printed then faxed to ...       [facility] - Pharmac (retail)       [address] [place], [place]  [address]       Ph: [phone]       Fax: [phone]   RxID:   [id number] ALLEGRA 180 MG TABS (FEXOFENADINE HCL) TAKE ONE (1) TABLET DAILY AS NEEDED FOR ALLERGIES  #30 x 0   Entered and Authorized by:  Tereso Newcomer PA-C   Signed by:   Tereso Newcomer PA-C on 01/03/2010   Method used:   Printed then faxed to ...       [facility] - Pharmac (retail)       [address] [place], [place]  [address]       Ph: [phone]       Fax: [phone]   RxID:   [id number]

## 2010-12-05 NOTE — Letter (Signed)
Summary: Handout Printed  Printed Handout:  - Diet - Sodium-Controlled 

## 2010-12-05 NOTE — Assessment & Plan Note (Signed)
Summary: Andrea [name] PT/ ESTAB W/Malaina Mortellaro///KT   Vital Signs:  Patient profile:   50 year old female Height:      65 inches Weight:      290 pounds BMI:     48.43 Temp:     97.9 degrees F oral Pulse rate:   105 / minute Pulse rhythm:   regular Resp:     18 per minute BP sitting:   209 / 112  (left arm) Cuff size:   large  Vitals Entered By: [name] (January 16, 2010 9:41 AM)  Serial Vital Signs/Assessments:  Time      Position  BP       Pulse  Resp  Temp     By 10:39 AM            200/102                        Tereso Newcomer PA-C  CC: establish with provider......, Hypertension Management Is Patient Diabetic? Yes Pain Assessment Patient in pain? no      CBG Result 159  Does patient need assistance? Functional Status Self care Ambulation Normal   Primary Care Provider:  Tereso Newcomer PA-C  CC:  establish with provider...... and Hypertension Management.  History of Present Illness: Here to est with me.  Previously saw Dr. Barbaraann [name] (Dr. Hal Hope before that).  BP:  Just started back on meds last week.  No meds in 2 mos.  She rushed in to get here today and just took meds before I walked into the room.    DM:  Takes metformin.  Sugars at home 130-140; highest 237.  Just started back on metformin last week.    High chol:  Out of crestor.  Depression:  Never on meds.  Feels depressed.  Also, feels angry.  Feels like crying a lot.  No suicidal ideations.  Does have a sister with depression.  Has a hard time sleeping.   Hypertension History:      She complains of headache, but denies chest pain, dyspnea with exertion, neurologic problems, and syncope.        Positive major cardiovascular risk factors include diabetes, hyperlipidemia, and hypertension.  Negative major cardiovascular risk factors include female age less than 59 years old and non-tobacco-user status.     Habits & Providers  Alcohol-Tobacco-Diet     Alcohol drinks/day: <1     Tobacco Status: quit     Pack  years: < 1  Exercise-Depression-Behavior     Drug Use: no  Problems Prior to Update: 1)  Depression  (ICD-311) 2)  Thyromegaly  (ICD-240.9) 3)  Family History Diabetes 1st Degree Relative  (ICD-V18.0) 4)  Hypertension  (ICD-401.9) 5)  Hyperthyroidism  (ICD-242.90) 6)  Allergic Rhinitis, Seasonal  (ICD-477.0) 7)  Hyperlipidemia  (ICD-272.4) 8)  Essential Hypertension, Benign  (ICD-401.1) 9)  Diabetes Mellitus, Type II  (ICD-250.00)  Current Medications (verified): 1)  Allegra 180 Mg Tabs (Fexofenadine Hcl) .... Take One (1) Tablet Daily As Needed For Allergies 2)  Aspir-Low 81 Mg Tbec (Aspirin) .... Take One (1) Tablet Each Day 3)  Crestor 10 Mg Tabs (Rosuvastatin Calcium) .... Take One (1) Tablet By Mouth Every Day 4)  Diovan 320 Mg Tabs (Valsartan) .... Take One (1) Tablet Each Day 5)  Hydrochlorothiazide 25 Mg Tabs (Hydrochlorothiazide) .... Take One (1) Tablet Every Morning 6)  Metformin Hcl 500 Mg Tb24 (Metformin Hcl) .... Take Two (2) Tablets Each Morning 7)  Nasacort  Aq 55 Mcg/act Aers (Triamcinolone Acetonide(Nasal)) .... Use One Spray in Each Nostril Once Daily 8)  Norvasc 10 Mg Tabs (Amlodipine Besylate) .... 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 GU:  Denies hematuria. Endo:  Denies cold intolerance and  heat intolerance.  Physical Exam  General:  alert, well-developed, and well-nourished.   Head:  normocephalic and atraumatic.   Eyes:  pupils equal, pupils round, and pupils reactive to light.   Neck:  supple, no carotid bruits, and thyromegaly.   carotid pulses bounding Lungs:  normal breath sounds, no crackles, and no wheezes.   Heart:  normal rate and regular rhythm.   Abdomen:  soft, non-tender, and normal bowel sounds.   Msk:  feet without callus or ulcers  Pulses:  DP/PT 2+ bilat Neurologic:  alert & oriented X3 and cranial nerves II-XII intact.   Psych:  good eye contact and slightly anxious.    Diabetes Management Exam:    Foot Exam (with socks and/or shoes not present):       Sensory-Monofilament:          Left foot: normal          Right foot: normal   Impression & Recommendations:  Problem # 1:  HYPERTENSION (ICD-401.9) out of control just started meds back last week but needs better control start lopressor 50 mg two times a day  Her updated medication list for this problem includes:    Diovan 320 Mg Tabs (Valsartan) .Marland Kitchen... Take one (1) tablet each day    Hydrochlorothiazide 25 Mg Tabs (Hydrochlorothiazide) .Marland Kitchen... Take one (1) tablet every morning    Norvasc 10 Mg Tabs (Amlodipine besylate) .Marland Kitchen... Take 1 tablet by mouth every morning    Lopressor 50 Mg Tabs (Metoprolol tartrate) .Marland Kitchen... Take 1 tablet by mouth two times a day for blood pressure  Problem # 2:  DIABETES MELLITUS, TYPE II (ICD-250.00)  check labs doubt A1C will be controlled as just started meds last week  Her updated medication list for this problem includes:    Aspir-low 81 Mg Tbec (Aspirin) .Marland Kitchen... Take one (1) tablet each day    Diovan 320 Mg Tabs (Valsartan) .Marland Kitchen... Take one (1) tablet each day    Metformin Hcl 500 Mg Tb24 (Metformin hcl) .Marland Kitchen... Take two (2) tablets each morning  Orders: T-Comprehensive Metabolic Panel ([address]) T-Urine Microalbumin w/creat. ratio ([phone]) T-  Hemoglobin A1C (21308-65784)  Problem # 3:  HYPERLIPIDEMIA (ICD-272.4) refill crestor check FLP in 3 mos  Her updated medication list for this problem includes:    Crestor 10 Mg Tabs (Rosuvastatin calcium) .Marland Kitchen... Take one (1) tablet by mouth every day  Problem # 4:  THYROMEGALY (ICD-240.9)  check ultrasound check TFTs  Orders: T-TSH ([phone]) T-T4, Free (69629-52841) T-T3 by RIA ([id number]) Ultrasound (Ultrasound)  Problem # 5:  DEPRESSION (ICD-311)  with anxiety component open  to seeing LCSW also would like to try med start zoloft  Orders: Psychology Referral (Psychology) T-CBC w/Diff [phone])  Her updated medication list for this problem includes:    Zoloft 50 Mg Tabs (Sertraline hcl) .Marland Kitchen... Take 1 tablet by mouth once a day  Complete Medication List: 1)  Allegra 180 Mg Tabs (Fexofenadine hcl) .... Take one (1) tablet daily as needed for allergies 2)  Aspir-low 81 Mg Tbec (Aspirin) .... Take one (1) tablet each day 3)  Crestor 10 Mg Tabs (Rosuvastatin calcium) .... Take one (1) tablet by mouth every day 4)  Diovan 320 Mg Tabs (Valsartan) .... Take one (1) tablet each day 5)  Hydrochlorothiazide 25 Mg Tabs (Hydrochlorothiazide) .... Take one (1) tablet every morning 6)  Metformin Hcl 500 Mg Tb24 (Metformin hcl) .... Take two (2) tablets each morning 7)  Nasacort Aq 55 Mcg/act Aers (Triamcinolone acetonide(nasal)) .... Use one spray in each nostril once daily 8)  Norvasc 10 Mg Tabs (Amlodipine besylate) .... Take 1 tablet by mouth every morning 9)  Zoloft 50 Mg Tabs (Sertraline hcl) .... Take 1 tablet by mouth once a day 10)  Lopressor 50 Mg Tabs (Metoprolol tartrate) .... Take 1 tablet by mouth two times a day for blood pressure  Other Orders: EKG w/ Interpretation (93000)  Hypertension Assessment/Plan:      The patient's hypertensive risk group is category C: Target organ damage and/or diabetes.  Today's blood pressure is 209/112.  Her blood pressure goal  is < 130/80.  Patient Instructions: 1)  Schedule retasure at [facility]. 2)  Return in one week for blood pressure and pulse check with the nurse. 3)  Follow up with Shantell Belongia in 2 weeks for 311 and blood pressure. 4)  Schedule appt with Ethelene Browns. 5)  Zoloft, metoprolol and Crestor have been faxed to the [facility] pharmacy. Prescriptions: LOPRESSOR 50 MG TABS (METOPROLOL TARTRATE) Take 1 tablet by mouth two times a day for blood pressure  #60 x 5   Entered and Authorized by:   Tereso Newcomer PA-C   Signed by:   Tereso Newcomer PA-C on 01/16/2010   Method used:   Faxed to ...       [facility] (retail)       [address] [place], [place]  [address]       Ph: [phone]       Fax: [phone]   RxID:   [id number] ZOLOFT 50 MG TABS (SERTRALINE HCL) Take 1 tablet by mouth once a day  #30 x 3   Entered and Authorized by:   Tereso Newcomer PA-C   Signed by:   Tereso Newcomer PA-C on 01/16/2010   Method used:   Faxed to ...       [facility] - [facility] (retail)       [address] [place], [place]  [address]       Ph: [phone]       Fax: [phone]   RxID:   [id number] CRESTOR 10 MG TABS (ROSUVASTATIN CALCIUM) TAKE ONE (1) TABLET BY MOUTH EVERY DAY  #30 x 5   Entered and Authorized by:   Tereso Newcomer PA-C   Signed by:   Tereso Newcomer PA-C on 01/16/2010   Method used:   Faxed to ...       [facility] - [facility] (retail)  [address] [place], [place]  [address]       Ph: [phone]       Fax: [phone]   RxID:   [id number]          Diabetic Foot Exam Last Podiatry Exam Date: 01/16/2010  Foot Inspection Is there a history of a foot ulcer?              No Is there a foot ulcer now?              No Are the shoes appropriate in style and fit?          Yes Is there swelling or an abnormal foot shape?          No Are the toenails  long?                No Are the toenails thick?                No Are the toenails ingrown?              No Is there heavy callous build-up?              No Is there a claw toe deformity?                          No Is there elevated skin temperature?            No Is there limited ankle dorsiflexion?            No Is there foot or ankle muscle weakness?            No Do you have pain in calf while walking?           No         10-g (5.07) Semmes-Weinstein Monofilament Test Performed by: [name]          Right Foot          Left Foot Visual Inspection               Test Control      normal         normal Site 1         normal         normal Site 2         normal         normal Site 3         normal         normal Site 4         normal         normal Site 5         normal         normal Site 6         normal         normal Site 7         normal         normal Site 8         normal         normal Site 9         normal         normal Site 10         normal         normal  Impression      normal  normal    EKG  Procedure date:  01/16/2010  Findings:      Normal sinus rhythm with rate of:  96 normal axis LVH ? LAE

## 2010-12-05 NOTE — Progress Notes (Signed)
Summary: Ophthalmology F/u  Phone Note Outgoing Call   Summary of Call: Patient was automatically referred to Ophthalmology after she had retasure. Please make sure she is getting an appointment. Initial call taken by: Brynda Rim,  Mar 27, 2010 11:04 PM  Follow-up for Phone Call        Left message on answering machine for pt to call back..     [name]  Mar 29, 2010 10:25 AM  Left message on answering machine for pt to call back.Marland KitchenMarland KitchenMarland KitchenArmenia Shannon   Mar 30, 2010 9:23 AM   Additional Follow-up for Phone Call Additional follow up Details #1::        Left message on answering machine for pt to call back.... will mail letter..[name]  April 06, 2010 9:23 AM

## 2010-12-05 NOTE — Assessment & Plan Note (Signed)
Summary: 2-3 WEEK FU ON BP///KT   Vital Signs:  Patient profile:   50 year old female Height:      65 inches Weight:      282 pounds BMI:     47.10 Temp:     97.6 degrees F oral Pulse rate:   67 / minute Pulse rhythm:   regular Resp:     20 per minute BP sitting:   172 / 107  (left arm) Cuff size:   large  Vitals Entered By: [name] (February 20, 2010 10:07 AM)  Serial Vital Signs/Assessments:  Time      Position  BP       Pulse  Resp  Temp     By 10:17 AM            160/100                        [name]  CC: Hypertension Management Is Patient Diabetic? No Pain Assessment Patient in pain? no       Does patient need assistance? Functional Status Self care Ambulation Normal   Primary Care Provider:  Tereso Newcomer PA-C  CC:  Hypertension Management.  History of Present Illness: Here for f/u.  HTN:  Thinks she missed her lopressor last night.  Walks to the clinic.  Was in an argument before leaving her house.  Thinks that caused it to be up.  Has had BP checked at work.  She is seeing readings of 140/96 to 160/90.    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  She picks up rx today.  No palps, irritability or diarrhea.  No throat pain.  Anxiety:  Under a lot of stress at work.  Never started zoloft.  Saw Marchelle Folks and was told she did not need to come back.  Had to use her sister's valium once and this helped.  GERD/Allergic Rhinitis:  Meds helped.  Cough much improved.  Hypertension History:      She denies headache, chest pain, and dyspnea with exertion.        Positive major cardiovascular risk factors include diabetes, hyperlipidemia, and hypertension.  Negative major cardiovascular risk factors include female age less than 76 years old and non-tobacco-user status.     Current Medications (verified): 1)  Allegra 180 Mg Tabs (Fexofenadine Hcl) .... Take One (1) Tablet Daily As Needed For Allergies 2)  Aspir-Low 81 Mg Tbec (Aspirin) ....  Take One (1) Tablet Each Day 3)  Crestor 10 Mg Tabs (Rosuvastatin Calcium) .... Take One (1) Tablet By Mouth Every Day 4)  Diovan 320 Mg Tabs (Valsartan) .... Take One (1) Tablet Each Day 5)  Hydrochlorothiazide 25 Mg Tabs (Hydrochlorothiazide) .... Take One (1) Tablet Every Morning 6)  Metformin Hcl 500 Mg Tb24 (Metformin Hcl) .... Take Two (2) Tablets Each Morning 7)  Nasacort Aq 55 Mcg/act Aers (Triamcinolone Acetonide(Nasal)) .... Use One Spray in Each Nostril Once Daily 8)  Norvasc 10 Mg Tabs (Amlodipine Besylate) .... Take 1 Tablet By Mouth Every Morning 9)  Zoloft 50 Mg Tabs (Sertraline Hcl) .... Take 1 Tablet By Mouth Once A Day 10)  Lopressor 50 Mg Tabs (Metoprolol Tartrate) .... Take 2  Tabs By Mouth Two Times A Day  For Blood Pressure (Pharmacy Note Dose Change) 11)  Flonase 50 Mcg/act Susp (Fluticasone Propionate) .Marland Kitchen.. 1-2 Sprays Each Nostril Once Daily (Use For 3 Weeks, Then Stop For One Week) 12)  Pepcid 20 Mg  Tabs (Famotidine) .... Take 1 Tablet By Mouth Two Times A Day 13)  Methimazole 5 Mg Tabs (Methimazole) .... Take 1 Tablet By Mouth Once A Day For 3 Days, Then Increae To Take 1 Tablet By Mouth Two Times A Day  Allergies (verified): No Known Drug Allergies  Physical Exam  General:  alert, well-developed, and well-nourished.   Head:  normocephalic and atraumatic.   Neck:  thyromegaly.   Lungs:  normal breath sounds, no crackles, and no wheezes.   Heart:  normal rate and regular rhythm.   Extremities:  no edema Neurologic:  alert & oriented X3 and cranial nerves II-XII intact.   Psych:  normally interactive, good eye contact, and not depressed appearing.     Impression & Recommendations:  Problem # 1:  ANXIETY (ICD-300.00) never took zoloft d/w patient how to take xanax and she understands encouraged her to take zoloft but notify me if she does.  Her updated medication list for this problem includes:    Zoloft 50 Mg Tabs (Sertraline hcl) .Marland Kitchen... Take 1 tablet by  mouth once a day    Alprazolam 0.25 Mg Tabs (Alprazolam) .Marland Kitchen... Take 1 by mouth once daily as needed for anxiety  Problem # 2:  HYPERTENSION (ICD-401.9) still uncontrolled add clonidine  Her updated medication list for this problem includes:    Diovan 320 Mg Tabs (Valsartan) .Marland Kitchen... Take one (1) tablet each day    Hydrochlorothiazide 25 Mg Tabs (Hydrochlorothiazide) .Marland Kitchen... Take one (1) tablet every morning    Norvasc 10 Mg Tabs (Amlodipine besylate) .Marland Kitchen... Take 1 tablet by mouth every morning    Lopressor 50 Mg Tabs (Metoprolol tartrate) .Marland Kitchen... Take 2  tabs by mouth two times a day  for blood pressure (pharmacy note dose change)    Clonidine Hcl 0.1 Mg Tabs (Clonidine hcl) .Marland Kitchen... Take 1 tablet by mouth two times a day for blood pressure  Problem # 3:  Preventive Health Care (ICD-V70.0) pneumovax and Td today  Problem # 4:  HYPERTHYROIDISM (ICD-242.90) to start on methimazole today repeat labs in 2 weeks  Her updated medication list for this problem includes:    Lopressor 50 Mg Tabs (Metoprolol tartrate) .Marland Kitchen... Take 2  tabs by mouth two times a day  for blood pressure (pharmacy note dose change)    Methimazole 5 Mg Tabs (Methimazole) .Marland Kitchen... Take 1 tablet by mouth once a day for 3 days, then increae to take 1 tablet by mouth two times a day  Complete Medication List: 1)  Allegra 180 Mg Tabs (Fexofenadine hcl) .... Take one (1) tablet daily as needed for allergies 2)  Aspir-low 81 Mg Tbec (Aspirin) .... Take one (1) tablet each day 3)  Crestor 10 Mg Tabs (Rosuvastatin calcium) .... Take one (1) tablet by mouth every day 4)  Diovan 320 Mg Tabs (Valsartan) .... Take one (1) tablet each day 5)  Hydrochlorothiazide 25 Mg Tabs (Hydrochlorothiazide) .... Take one (1) tablet every morning 6)  Metformin Hcl 500 Mg Tb24 (Metformin hcl) .... Take two (2) tablets each morning 7)  Nasacort Aq 55 Mcg/act Aers (Triamcinolone acetonide(nasal)) .... Use one spray in each nostril once daily 8)  Norvasc 10 Mg  Tabs (Amlodipine besylate) .... Take 1 tablet by mouth every morning 9)  Zoloft 50 Mg Tabs (Sertraline hcl) .... Take 1 tablet by mouth once a day 10)  Lopressor 50 Mg Tabs (Metoprolol tartrate) .... Take 2  tabs by mouth two times a day  for blood pressure (pharmacy note dose change)  11)  Flonase 50 Mcg/act Susp (Fluticasone propionate) .Marland Kitchen.. 1-2 sprays each nostril once daily (use for 3 weeks, then stop for one week) 12)  Pepcid 20 Mg Tabs (Famotidine) .... Take 1 tablet by mouth two times a day 13)  Methimazole 5 Mg Tabs (Methimazole) .... Take 1 tablet by mouth once a day for 3 days, then increae to take 1 tablet by mouth two times a day 14)  Alprazolam 0.25 Mg Tabs (Alprazolam) .... Take 1 by mouth once daily as needed for anxiety 15)  Clonidine Hcl 0.1 Mg Tabs (Clonidine hcl) .... Take 1 tablet by mouth two times a day for blood pressure  Other Orders: Tdap => 52yrs IM (66440) Pneumococcal Vaccine (34742) Admin 1st Vaccine (59563) Admin of Any Addtl Vaccine (87564) Admin 1st Vaccine (State) [phone]) Admin of Any Addtl Vaccine (State) (88416S)  Hypertension Assessment/Plan:      The patient's hypertensive risk group is category C: Target organ damage and/or diabetes.  Today's blood pressure is 172/107.  Her blood pressure goal is < 130/80.  Patient Instructions: 1)  Pneumovax and Td shot today. 2)  Take xanax only as needed. 3)  Let me know if you decide to start the Zoloft. 4)  Start the Methimazole today. 5)  Lab visit in 2 weeks: TSH and Free T4 (242.9) 6)  Schedule follow up with Suhailah Kwan in 6 weeks for blood pressure and diabetes (mid-June).  Come fasting for labs (nothing to eat or drink after midnight the night before except water). 7)  Call me if you have not heard about an appointment with Endocrinology in 2 weeks. Prescriptions: CLONIDINE HCL 0.1 MG TABS (CLONIDINE HCL) Take 1 tablet by mouth two times a day for blood pressure  #60 x 5   Entered and Authorized by:   Tereso Newcomer PA-C   Signed by:   Tereso Newcomer PA-C on 02/20/2010   Method used:   Print then Give to Patient   RxID:   [id number] ALPRAZOLAM 0.25 MG TABS (ALPRAZOLAM) Take 1 by mouth once daily as needed for anxiety  #30 x 0   Entered and Authorized by:   Tereso Newcomer PA-C   Signed by:   Tereso Newcomer PA-C on 02/20/2010   Method used:   Print then Give to Patient   RxID:   [id number]    Tetanus/Td Vaccine    Vaccine Type: Tdap    Site: left deltoid    Mfr: Sanofi Pasteur    Dose: 0.5 ml    Route: IM    Given by: [name]    Exp. Date: 06/02/2012    Lot #: [id number]    VIS given: 09/23/07 version given February 20, 2010.  Pneumovax Vaccine    Vaccine Type: Pneumovax    Site: right deltoid    Mfr: Merck    Dose: 0.5 ml    Route: IM    Given by: [name]    Exp. Date: 09/04/2011    Lot #: [id number]    VIS given: 06/02/96 version given February 20, 2010.   Orders Added: 1)  Est. Patient Level III [10626] 2)  Tdap => 54yrs IM [90715] 3)  Pneumococcal Vaccine [90732] 4)  Admin 1st Vaccine [90471] 5)  Admin of Any Addtl Vaccine [90472] 6)  Admin 1st Vaccine [facility]) [94854O] 7)  Admin of Any Addtl Vaccine [facility]) [phone]

## 2010-12-05 NOTE — Letter (Signed)
Summary: PSYCHIATRIC ELVALUATION  PSYCHIATRIC ELVALUATION   Imported By: Arta Bruce 07/03/2010 10:15:06  _____________________________________________________________________  External Attachment:    Type:   Image     Comment:   External Document

## 2010-12-05 NOTE — Progress Notes (Signed)
Summary: Possible D/C  Phone Note Outgoing Call   Summary of Call: [place], Please contact patient regarding no show policy. Any additional no shows pt will be discharged from the practice. Initial call taken by: Hassell Halim CMA,  June 28, 2010 9:55 AM  Follow-up for Phone Call        Left message on answering machine for pt to call back.Marland KitchenMarland KitchenMarland KitchenArmenia Shannon  June 29, 2010 2:29 PM   Left message on answering machine for pt to call back.Marland KitchenMarland KitchenMarland KitchenArmenia Shannon  June 30, 2010 2:39 PM   Left message on answering machine for pt to call back.Marland Kitchen...will mail letter.... [name]  July 04, 2010 2:39 PM

## 2010-12-05 NOTE — Letter (Signed)
Summary: *HSN Results Follow up  [facility]  [address] [place], [place] [address]   Phone: [phone]  Fax: [phone]      04/06/2010   Andrea [name] [address] [place], [place]  [address]   Dear  Ms. [name],                            ____S.Drinkard,FNP   ____D. Gore,FNP       ____B. McPherson,MD   ____V. Rankins,MD    ____E. Mulberry,MD    ____N. Daphine Deutscher, FNP  ____D. Reche Dixon, MD    ____K. Philipp Deputy, MD    ____Other     This letter is to inform you that your recent test(s):  _______Pap Smear    _______Lab Test     _______X-ray    _______ is within acceptable limits  _______ requires a medication change  _______ requires a follow-up lab visit  _______ requires a follow-up visit with your provider   Comments:  We have been trying to reach you.  Please give the office a call at your earliest convenience.       _________________________________________________________ If you have any questions, please contact our office                     Sincerely,  [name] [facility]

## 2010-12-05 NOTE — Letter (Signed)
Summary: *Referral Letter  [facility]  [address] [place], [place] [address]   Phone: [phone]  Fax: [phone]    02/09/2010  Thank you in advance for agreeing to see my patient:  Andrea [name] [address] [place], [place]  [address]  Phone: [phone]  Reason for Referral: 50 yo female with suppressed TSH and elevated Free T4 and T3.  She had multiple large nodules noted on ultrasound.  Thyroid scan consistent with multiple hyperfunctioning nodules.  She has been placed on a beta blocker and I am starting methimazole.  Please evaluate.  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) 2)  GERD (ICD-530.81) 3)  THYROID NODULE (ICD-241.0) 4)  DEPRESSION (ICD-311) 5)  THYROMEGALY (ICD-240.9) 6)  FAMILY HISTORY DIABETES 1ST DEGREE RELATIVE (ICD-V18.0) 7)  HYPERTENSION (ICD-401.9) 8)  HYPERTHYROIDISM (ICD-242.90) 9)  ALLERGIC RHINITIS, SEASONAL (ICD-477.0) 10)  HYPERLIPIDEMIA (ICD-272.4) 11)  ESSENTIAL HYPERTENSION, BENIGN (ICD-401.1) 12)  DIABETES MELLITUS, TYPE II (ICD-250.00)   Current Medications: 1)  ALLEGRA 180 MG TABS (FEXOFENADINE HCL) TAKE ONE (1) TABLET DAILY AS NEEDED FOR ALLERGIES 2)  ASPIR-LOW 81 MG TBEC (ASPIRIN) TAKE ONE (1) TABLET EACH DAY 3)  CRESTOR 10 MG TABS (ROSUVASTATIN CALCIUM) TAKE ONE (1) TABLET BY MOUTH EVERY DAY 4)  DIOVAN 320 MG TABS (VALSARTAN) TAKE ONE (1) TABLET EACH DAY 5)  HYDROCHLOROTHIAZIDE 25 MG TABS (HYDROCHLOROTHIAZIDE) TAKE ONE (1) TABLET EVERY MORNING 6)  METFORMIN HCL 500 MG TB24 (METFORMIN HCL) TAKE TWO (2) TABLETS EACH MORNING 7)  NASACORT AQ 55 MCG/ACT AERS (TRIAMCINOLONE ACETONIDE(NASAL)) USE ONE SPRAY IN EACH NOSTRIL ONCE DAILY 8)  NORVASC 10 MG TABS (AMLODIPINE BESYLATE) Take 1 tablet by mouth every morning 9)  ZOLOFT 50 MG TABS (SERTRALINE HCL) Take 1 tablet by mouth once a day 10)  LOPRESSOR 50 MG TABS (METOPROLOL TARTRATE) Take 2  tabs by mouth two  times a day  for blood pressure (pharmacy note dose change) 11)  FLONASE 50 MCG/ACT SUSP (FLUTICASONE PROPIONATE) 1-2 sprays each nostril once daily (use for 3 weeks, then stop for one week) 12)  PEPCID 20 MG TABS (FAMOTIDINE) Take 1 tablet by mouth two times a day 13)  METHIMAZOLE 5 MG TABS (METHIMAZOLE) Take 1 tablet by mouth once a day for 3 days, then increae to Take 1 tablet by mouth two times a day   Past Medical History: 1)  Diabetes mellitus, type II 2)  Hypertension 3)  Hyperlipidemia 4)  left rotator cuff syndrome   Prior History of Blood Transfusions:   Pertinent Labs:    Thank you again for agreeing to see our patient; please contact us if you have any further questions or need additional information.  Sincerely,  Tereso Newcomer PA-C

## 2010-12-05 NOTE — Progress Notes (Signed)
Summary: DROPPED OFF FMLA PAPERS  Phone Note Call from Patient Call back at Home Phone [phone]   Summary of Call: Andrea [name] PT. Andrea [name] DROPPED OFF HER FAMILY MEDICAL LEAVE FORM FROM HER JOB TO BE FILLED OUT AND THEY TOLD HER THAT SHE WILL NEED IT BACK IN  ABOUT 4 DAYS. Initial call taken by: Andrea [name],  April 26, 2010 2:40 PM  Follow-up for Phone Call        Left message on answering machine for pt to call back.Marland KitchenMarland KitchenMarland KitchenArmenia [name]  April 28, 2010 12:02 PM   pt aware that papers are ready..... Andrea [name]  April 28, 2010 2:41 PM

## 2010-12-05 NOTE — Assessment & Plan Note (Signed)
Summary: 1 MONTH FU/////KT   Vital Signs:  Patient profile:   50 year old female Height:      65 inches Weight:      281 pounds BMI:     46.93 Temp:     97.4 degrees F oral Pulse rate:   76 / minute Pulse rhythm:   regular BP sitting:   187 / 120  (left arm) Cuff size:   large  Vitals Entered By: [name] (May 10, 2010 10:43 AM)  Serial Vital Signs/Assessments:  Time      Position  BP       Pulse  Resp  Temp     By 12:01 PM            132/78                         Tereso Newcomer PA-C 12:01 PM            138/80                         Tereso Newcomer PA-C  Comments: 12:01 PM left arm  By: Tereso Newcomer PA-C  12:01 PM right arm  By: Tereso Newcomer PA-C   CC: one month f/u..., Hypertension Management Is Patient Diabetic? No Pain Assessment Patient in pain? no      CBG Result 151  Does patient need assistance? Functional Status Self care Ambulation Normal   Primary Care Provider:  Tereso Newcomer PA-C  CC:  one month f/u... and Hypertension Management.  History of Present Illness: Here for followup.  Anxiety:  Has rough time now with recent murder of daughter.  Now in custody of her 2 children (ages 77 and 5).  Takes xanax infrequently.  Still does not want to take Zoloft.  Has appt with Marchelle Folks next week.  Encouraged her to go as I think she needs counseling with the current situation she is in.  If she decides to take Zoloft, I need to see her back closely.  Hyperthyroidism:  Missed appt at [facility].  Has appt rescheduled for next month.  Notes palps for about 1 week.  HR seems fast and lasts just a few seconds.  Makes her feel a little weak.  No syncope.  Feels lightheadedness.  Question if she has near syncope.  She does note some chest tightness.  Does feel shorth of breath.  No chest pain with exertion.  Does note DOE.  Maybe worse over 1 month.  NYHA class 2 (has to stop due to knee problems).  Sleeps on 2 pillows and has done so her whole life.  No PND.  No swelling  in legs noticed.  No diarrhea.  Is upset over her current social situation.  Hypertension History:      She complains of chest pain, palpitations, and dyspnea with exertion, but denies syncope.  Further comments include: see above.        Positive major cardiovascular risk factors include diabetes, hyperlipidemia, and hypertension.  Negative major cardiovascular risk factors include female age less than 64 years old and non-tobacco-user status.     Problems Prior to Update: 1)  Chest Pain Unspecified  (ICD-786.50) 2)  Palpitations  (ICD-785.1) 3)  Anxiety  (ICD-300.00) 4)  Goiter, Toxic, Multinodular  (ICD-242.20) 5)  Gerd  (ICD-530.81) 6)  Thyroid Nodule  (ICD-241.0) 7)  Depression  (ICD-311) 8)  Thyromegaly  (ICD-240.9) 9)  Family History  Diabetes 1st Degree Relative  (ICD-V18.0) 10)  Hypertension  (ICD-401.9) 11)  Hyperthyroidism  (ICD-242.90) 12)  Allergic Rhinitis, Seasonal  (ICD-477.0) 13)  Hyperlipidemia  (ICD-272.4) 14)  Essential Hypertension, Benign  (ICD-401.1) 15)  Diabetes Mellitus, Type II  (ICD-250.00)  Current Medications (verified): 1)  Allegra 180 Mg Tabs (Fexofenadine Hcl) .... Take One (1) Tablet Daily As Needed For Allergies 2)  Aspir-Low 81 Mg Tbec (Aspirin) .... Take One (1) Tablet Each Day 3)  Crestor 10 Mg Tabs (Rosuvastatin Calcium) .... Take One (1) Tablet By Mouth Every Day 4)  Diovan 320 Mg Tabs (Valsartan) .... Take One (1) Tablet Each Day 5)  Hydrochlorothiazide 25 Mg Tabs (Hydrochlorothiazide) .... Take One (1) Tablet Every Morning 6)  Metformin Hcl 500 Mg Tb24 (Metformin Hcl) .... Take 1 Tablet By Mouth Two Times A Day 7)  Nasacort Aq 55 Mcg/act Aers (Triamcinolone Acetonide(Nasal)) .... Use One Spray in Each Nostril Once Daily 8)  Norvasc 10 Mg Tabs (Amlodipine Besylate) .... Take 1 Tablet By Mouth Every Morning 9)  Zoloft 50 Mg Tabs (Sertraline Hcl) .... Take 1 Tablet By Mouth Once A Day 10)  Lopressor 50 Mg Tabs (Metoprolol Tartrate) .... Take 2   Tabs By Mouth Two Times A Day  For Blood Pressure (Pharmacy Note Dose Change) 11)  Flonase 50 Mcg/act Susp (Fluticasone Propionate) .Marland Kitchen.. 1-2 Sprays Each Nostril Once Daily (Use For 3 Weeks, Then Stop For One Week) 12)  Pepcid 20 Mg Tabs (Famotidine) .... Take 1 Tablet By Mouth Two Times A Day 13)  Methimazole 10 Mg Tabs (Methimazole) .... Take 1 Tablet By Mouth Two Times A Day 14)  Alprazolam 0.25 Mg Tabs (Alprazolam) .... Take 1 By Mouth Once Daily As Needed For Anxiety 15)  Clonidine Hcl 0.2 Mg Tabs (Clonidine Hcl) .... 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  Physical Exam  General:  alert, well-developed, and well-nourished.   Head:  normocephalic and atraumatic.   Neck:  thyromegaly.  no JVD.   Lungs:  normal breath sounds, no crackles, and no wheezes.   Heart:  normal rate and regular rhythm.   Abdomen:  soft.   Extremities:  no edema  Neurologic:  alert & oriented X3 and cranial nerves II-XII intact.   Skin:  warm and dry  Psych:  Oriented X3 and normally interactive.     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 Take 2  tabs by mouth two times a day  for blood pressure  (pharmacy note dose change)    Methimazole 10 Mg Tabs (Methimazole) .Marland Kitchen... Take 1 tablet by mouth two times a day  Orders: T-TSH (60454-09811) T-T4, Free [phone]) T-T3, Free [phone])  Problem # 2:  PALPITATIONS (ICD-785.1)  could be related to anxiety but, at  risk for AFib with hyperthyroidism contine beta blocker check ECG check Echo check 48 hour holter close f/u with me if continues to have symptoms with neg. workup by me, will need to see cardiology will make sure she continues ASA 81 mg once daily   Her updated medication list for this problem includes:    Lopressor 50 Mg Tabs (Metoprolol tartrate) .Marland Kitchen... Take 2  tabs by mouth two times a day  for blood pressure (pharmacy note dose change)  Orders: EKG w/ Interpretation (93000) 24 Hr Holter (24 Hr Holter) T-Basic Metabolic Panel (16109-60454) 2 D Echo (2 D Echo)  Problem # 3:  HYPERTHYROIDISM (ICD-242.90)  as above  Her updated medication list for this problem includes:    Lopressor 50 Mg Tabs (Metoprolol tartrate) .Marland Kitchen... Take 2  tabs by mouth two times a day  for blood pressure (pharmacy note dose change)    Methimazole 10 Mg Tabs (Methimazole) .Marland Kitchen... Take 1 tablet by mouth two times a day  Orders: T-TSH (09811-91478) T-T4, Free [phone]) T-T3, Free [phone])  Problem # 4:  HYPERLIPIDEMIA (ICD-272.4)  Her updated medication list for this problem includes:    Crestor 10 Mg Tabs (Rosuvastatin calcium) .Marland Kitchen... Take one (1) tablet by mouth every day  Orders: T-Lipid Profile (28413-24401)  Problem # 5:  ESSENTIAL HYPERTENSION, BENIGN (ICD-401.1) Assessment: Improved repeat by me close to goal does admit to some salt indiscretion   Her updated medication list for this problem includes:    Diovan 320 Mg Tabs (Valsartan) .Marland Kitchen... Take one (1) tablet each day    Hydrochlorothiazide 25 Mg Tabs (Hydrochlorothiazide) .Marland Kitchen... Take one (1) tablet every morning    Norvasc 10 Mg Tabs (Amlodipine besylate) .Marland Kitchen...  Take 1 tablet by mouth every morning    Lopressor 50 Mg Tabs (Metoprolol tartrate) .Marland Kitchen... Take 2  tabs by mouth two times a day  for blood pressure (pharmacy note dose change)    Clonidine Hcl 0.2 Mg Tabs (Clonidine hcl) .Marland Kitchen... Take 1 tablet by mouth two times a day for blood pressure (pharmacy note dose increase)  Problem # 6:  DIABETES MELLITUS, TYPE II (ICD-250.00) retasure was incomplete f/u on referral to eye doctor at follow up appt  Her updated medication list for this problem includes:    Aspir-low 81 Mg Tbec (Aspirin) .Marland Kitchen... Take one (1) tablet each day    Diovan 320 Mg Tabs (Valsartan) .Marland Kitchen... Take one (1) tablet each day    Metformin Hcl 500 Mg Tb24 (Metformin hcl) .Marland Kitchen... Take 1 tablet by mouth two times a day  Problem # 7:  Preventive Health Care (ICD-V70.0) eventually get CPP done  Problem # 8:  ANXIETY (ICD-300.00) encouraged her to see AAnanias Pilgrim  The following medications were removed from the medication list:    Zoloft 50 Mg Tabs (Sertraline hcl) .Marland Kitchen... Take 1 tablet by mouth once a day Her updated medication list for this problem includes:    Alprazolam 0.25 Mg Tabs (Alprazolam) .Marland Kitchen... Take 1 by mouth once daily as needed for anxiety  Complete Medication List: 1)  Allegra 180 Mg Tabs (Fexofenadine hcl) .... Take one (1) tablet daily as needed for allergies 2)  Aspir-low 81 Mg Tbec (Aspirin) .... Take one (1) tablet each day 3)  Crestor 10 Mg Tabs (Rosuvastatin calcium) .... Take one (1) tablet by mouth every day 4)  Diovan 320 Mg Tabs (Valsartan) .... Take one (1) tablet each day 5)  Hydrochlorothiazide 25 Mg Tabs (Hydrochlorothiazide) .... Take one (1) tablet every morning 6)  Metformin Hcl 500 Mg Tb24 (Metformin hcl) .... Take 1 tablet by mouth two times a day 7)  Nasacort Aq 55 Mcg/act Aers (Triamcinolone acetonide(nasal)) .... Use one spray in each nostril once daily 8)  Norvasc 10 Mg Tabs (Amlodipine besylate) .... Take 1 tablet by mouth every morning 9)  Lopressor 50  Mg Tabs (Metoprolol tartrate) .... Take 2  tabs by mouth two times a day  for blood pressure (pharmacy note dose change) 10)  Flonase 50 Mcg/act Susp (Fluticasone propionate) .Marland Kitchen.. 1-2 sprays each nostril once daily (use for 3 weeks, then stop for one week) 11)  Pepcid 20 Mg Tabs (Famotidine) .... Take 1 tablet by mouth two times a day 12)  Methimazole 10 Mg Tabs (Methimazole) .... Take 1 tablet by mouth two times a day 13)  Alprazolam 0.25 Mg Tabs (Alprazolam) .... Take 1 by mouth once daily as needed for anxiety 14)  Clonidine Hcl 0.2 Mg Tabs (Clonidine hcl) .... Take 1 tablet by mouth two times a day for blood pressure (pharmacy note dose increase)  Hypertension Assessment/Plan:      The patient's hypertensive risk group is category C: Target organ damage and/or diabetes.  Today's blood pressure is 187/120.  Her blood pressure goal is < 130/80.  Patient Instructions: 1)  Keep taking Aspirin once daily  2)  Please schedule a follow-up appointment in 1 month with Scott for palpitations and blood pressure. 3)  Make sure you take your medicines every day.  Make sure you take your medicines when you come in to see me next. 4)  Return sooner if palpitations get worse or you feel worse.   EKG  Procedure date:  05/10/2010  Findings:      Normal sinus rhythm with rate of:  67 normal axis no ischemic changes

## 2010-12-05 NOTE — Letter (Signed)
Summary: [facility]/CERTIFICATION OF HEALTH CARE PROVIDER  [facility]/CERTIFICATION OF HEALTH CARE PROVIDER   Imported By: Arta Bruce 04/28/2010 09:43:44  _____________________________________________________________________  External Attachment:    Type:   Image     Comment:   External Document

## 2010-12-05 NOTE — Assessment & Plan Note (Signed)
Summary: 1 MONTH FU////KT   Vital Signs:  Patient profile:   50 year old [name] Height:      65 inches Weight:      281 pounds BMI:     46.93 Temp:     99.2 degrees F oral Pulse rate:   74 / minute Pulse rhythm:   regular Resp:     22 per minute BP sitting:   178 / 102  (left arm)  Vitals Entered By: CMA Student Linzie Collin CC: Follow-up visit, BP and DM, Hypertension Management Is Patient Diabetic? Yes Pain Assessment Patient in pain? no      CBG Result 152  Does patient need assistance? Functional Status Self care Ambulation Normal   Primary Care Provider:  Tereso Newcomer PA-C  CC:  Follow-up visit, BP and DM, and Hypertension Management.  History of Present Illness: Here for f/u. To get RAI at [facility] for hyperthyroidism some time in next 2 weeks.  Off methimazole now.  [name] to schedule appt.  Lost all meds.  No BP meds in 3 days.    Palps:  Denies having any further.  Never got echo or holter.  WIll hold off for now.  She has a lot of things she is taking care of and having a hard enough time taking her meds like she is supposed to.  Anxiety:  DOing ok.  Remember daugher murdered recently.  Taking xanax occ.   Hypertension History:      She denies chest pain, dyspnea with exertion, and syncope.  She notes no problems with any antihypertensive medication side effects.        Positive major cardiovascular risk factors include diabetes, hyperlipidemia, and hypertension.  Negative major cardiovascular risk factors include [name] age less than 36 years old and non-tobacco-user status.    Current Medications (verified): 1)  Allegra 180 Mg Tabs (Fexofenadine Hcl) .... Take One (1) Tablet Daily As Needed For Allergies 2)  Aspir-Low 81 Mg Tbec (Aspirin) .... Take One (1) Tablet Each Day 3)  Diovan 320 Mg Tabs (Valsartan) .... Take One (1) Tablet Each Day 4)  Hydrochlorothiazide 25 Mg Tabs (Hydrochlorothiazide) .... Take One (1) Tablet Every Morning 5)   Metformin Hcl 500 Mg Tb24 (Metformin Hcl) .... Take 1 Tablet By Mouth Two Times A Day 6)  Nasacort Aq 55 Mcg/act Aers (Triamcinolone Acetonide(Nasal)) .... Use One Spray in Each Nostril Once Daily 7)  Norvasc 10 Mg Tabs (Amlodipine Besylate) .... Take 1 Tablet By Mouth Every Morning 8)  Lopressor 50 Mg Tabs (Metoprolol Tartrate) .... Take 2  Tabs By Mouth Two Times A Day  For Blood Pressure (Pharmacy Note Dose Change) 9)  Flonase 50 Mcg/act Susp (Fluticasone Propionate) .Marland Kitchen.. 1-2 Sprays Each Nostril Once Daily (Use For 3 Weeks, Then Stop For One Week) 10)  Pepcid 20 Mg Tabs (Famotidine) .... Take 1 Tablet By Mouth Two Times A Day 11)  Methimazole 10 Mg Tabs (Methimazole) .... Take 2 Tablets By Mouth Three Times A Day 12)  Alprazolam 0.25 Mg Tabs (Alprazolam) .... Take 1 By Mouth Once Daily As Needed For Anxiety 13)  Clonidine Hcl 0.2 Mg Tabs (Clonidine Hcl) .... Take 1 Tablet By Mouth Two Times A Day For Blood Pressure (Pharmacy Note Dose Increase) 14)  Pravastatin Sodium 40 Mg Tabs (Pravastatin Sodium) .... Take 1 Tab By Mouth At Bedtime  Allergies (verified): No Known Drug Allergies  Physical Exam  General:  alert, well-developed, well-nourished, and overweight-appearing.   Head:  normocephalic.  Neck:  thyromegaly.  no jvd Lungs:  normal breath sounds, no crackles, and no wheezes.   Heart:  normal rate, regular rhythm, and no murmur.   Neurologic:  alert & oriented X3 and cranial nerves II-XII intact.    Diabetes Management Exam:    Foot Exam (with socks and/or shoes not present):       Sensory-Monofilament:          Left foot: normal          Right foot: normal       Inspection:          Left foot: normal          Right foot: normal       Nails:          Left foot: normal          Right foot: normal   Impression & Recommendations:  Problem # 1:  HYPERTENSION (ICD-401.9) restart meds f/u in one month Her updated medication list for this problem includes:    Diovan 320 Mg  Tabs (Valsartan) .Marland Kitchen... Take one (1) tablet each day    Hydrochlorothiazide 25 Mg Tabs (Hydrochlorothiazide) .Marland Kitchen... Take one (1) tablet every morning    Norvasc 10 Mg Tabs (Amlodipine besylate) .Marland Kitchen... Take 1 tablet by mouth every morning    Lopressor 50 Mg Tabs (Metoprolol tartrate) .Marland Kitchen... Take 2  tabs by mouth two times a day  for blood pressure (pharmacy note dose change)    Clonidine Hcl 0.2 Mg Tabs (Clonidine hcl) .Marland Kitchen... Take 1 tablet by mouth two times a day for blood pressure (pharmacy note dose increase)  Problem # 2:  PALPITATIONS (ICD-785.1) Assessment: Improved no further palps never got echo or holter hold off for now  Her updated medication list for this problem includes:    Lopressor 50 Mg Tabs (Metoprolol tartrate) .Marland Kitchen... Take 2  tabs by mouth two times a day  for blood pressure (pharmacy note dose change)  Problem # 3:  HYPERTHYROIDISM (ICD-242.90) RAI at [facility]  Her updated medication list for this problem includes:    Lopressor 50 Mg Tabs (Metoprolol tartrate) .Marland Kitchen... Take 2  tabs by mouth two times a day  for blood pressure (pharmacy note dose change)    Methimazole 10 Mg Tabs (Methimazole) ..... Stop taking - per [facility] endocrinologist  Problem # 4:  ANXIETY (ICD-300.00) stable  Her updated medication list for this problem includes:    Alprazolam 0.25 Mg Tabs (Alprazolam) .Marland Kitchen... Take 1 by mouth once daily as needed for anxiety  Problem # 5:  DIABETES MELLITUS, TYPE II (ICD-250.00)  Her updated medication list for this problem includes:    Aspir-low 81 Mg Tbec (Aspirin) .Marland Kitchen... Take one (1) tablet each day    Diovan 320 Mg Tabs (Valsartan) .Marland Kitchen... Take one (1) tablet each day    Metformin Hcl 500 Mg Tb24 (Metformin hcl) .Marland Kitchen... Take 1 tablet by mouth two times a day  Orders: Capillary Blood Glucose/CBG (16109) T- Hemoglobin A1C (60454-09811)  Complete Medication List: 1)  Allegra 180 Mg Tabs (Fexofenadine hcl) .... Take one (1) tablet daily as needed for allergies 2)  Aspir-low  81 Mg Tbec (Aspirin) .... Take one (1) tablet each day 3)  Diovan 320 Mg Tabs (Valsartan) .... Take one (1) tablet each day 4)  Hydrochlorothiazide 25 Mg Tabs (Hydrochlorothiazide) .... Take one (1) tablet every morning 5)  Metformin Hcl 500 Mg Tb24 (Metformin hcl) .... Take 1 tablet by mouth two times a day 6)  Nasacort Aq 55 Mcg/act Aers (Triamcinolone acetonide(nasal)) .... Use one spray in each nostril once daily 7)  Norvasc 10 Mg Tabs (Amlodipine besylate) .... Take 1 tablet by mouth every morning 8)  Lopressor 50 Mg Tabs (Metoprolol tartrate) .... Take 2  tabs by mouth two times a day  for blood pressure (pharmacy note dose change) 9)  Flonase 50 Mcg/act Susp (Fluticasone propionate) .Marland Kitchen.. 1-2 sprays each nostril once daily (use for 3 weeks, then stop for one week) 10)  Methimazole 10 Mg Tabs (Methimazole) .... Stop taking - per [facility] endocrinologist 11)  Alprazolam 0.25 Mg Tabs (Alprazolam) .... Take 1 by mouth once daily as needed for anxiety 12)  Clonidine Hcl 0.2 Mg Tabs (Clonidine hcl) .... Take 1 tablet by mouth two times a day for blood pressure (pharmacy note dose increase) 13)  Pravastatin Sodium 40 Mg Tabs (Pravastatin sodium) .... Take 1 tab by mouth at bedtime  Hypertension Assessment/Plan:      The patient's hypertensive risk group is category C: Target organ damage and/or diabetes.  Her calculated 10 year risk of coronary heart disease is 20 %.  Today's blood pressure is 178/102.  Her blood pressure goal is < 130/80.   Patient Instructions: 1)  Get medicines tomorrow at [facility]. pharmacy and start taking. 2)  Return in 1 month with Cleone Hulick for blood pressure. 3)  Take medicines every day.  Do not miss any. 4)  Take blood pressure medicines on the day you return to see [facility]. 5)  Follow up with the doctor at [facility] for your thyroid. Prescriptions: FLONASE 50 MCG/ACT SUSP (FLUTICASONE PROPIONATE) 1-2 sprays each nostril once daily (use for 3 weeks, then stop for one week)   #1 x 5   Entered and Authorized by:   Tereso Newcomer PA-C   Signed by:   Tereso Newcomer PA-C on 07/05/2010   Method used:   Faxed to ...       [facility] - [facility] (retail)       [address] [place], [place]  [address]       Ph: [phone]       Fax: [phone]   RxID:   [id number] PRAVASTATIN SODIUM 40 MG TABS (PRAVASTATIN SODIUM) Take 1 tab by mouth at bedtime  #30 x 5   Entered and Authorized by:   Tereso Newcomer PA-C   Signed by:   Tereso Newcomer PA-C on 07/05/2010   Method used:   Faxed to ...       [facility] - [facility] (retail)       [address] [place], [place]  [address]       Ph: [phone]       Fax: [phone]   RxID:   [id number] CLONIDINE HCL 0.2 MG TABS (CLONIDINE HCL) Take 1 tablet by mouth two times a day for blood pressure (pharmacy note dose increase)  #60 x 5   Entered and Authorized by:   Tereso Newcomer PA-C   Signed by:   Tereso Newcomer PA-C on 07/05/2010   Method used:   Faxed to ...       [facility] - [facility] (retail)       [address] [place], [place]  [address]       Ph: [phone]       Fax: [phone]   RxID:  [id number] LOPRESSOR 50 MG TABS (METOPROLOL TARTRATE) Take 2  tabs by mouth two times a day  for blood pressure (pharmacy note dose change)  #120 x 5   Entered and Authorized by:   Tereso Newcomer PA-C   Signed by:   Tereso Newcomer PA-C on 07/05/2010   Method used:   Faxed to ...       [facility] - Pharmac (retail)       [address] [place], [place]  [address]       Ph: [phone]       Fax: [phone]   RxID:   [id number] NORVASC 10 MG TABS (AMLODIPINE BESYLATE) Take 1 tablet by mouth every morning  #30 x 5   Entered and Authorized by:   Tereso Newcomer PA-C   Signed by:   Tereso Newcomer PA-C on 07/05/2010   Method used:   Faxed to ...       [facility] - [facility] (retail)       [address] [place], [place]  [address]       Ph: [phone]       Fax: [phone]   RxID:   [id number] HYDROCHLOROTHIAZIDE 25 MG TABS (HYDROCHLOROTHIAZIDE) TAKE ONE (1) TABLET EVERY MORNING  #30 x 5   Entered and Authorized by:   Tereso Newcomer PA-C   Signed by:   Tereso Newcomer PA-C on 07/05/2010   Method used:   Faxed to ...       [facility] - [facility] (retail)       [address] [place], [place]  [address]       Ph: [phone]       Fax: [phone]   RxID:   [id number] DIOVAN 320 MG TABS (VALSARTAN) TAKE ONE (1) TABLET EACH DAY  #30 x 5   Entered and Authorized by:   Tereso Newcomer PA-C   Signed by:   Tereso Newcomer PA-C on 07/05/2010   Method used:   Faxed to ...       [facility] - [facility] (retail)       [address] [place], [place]  [address]       Ph: [phone]       Fax: [phone]   RxID:   [id number]   Last LDL:                                                 161 (05/10/2010 10:00:00 PM)        Diabetic Foot Exam Last Podiatry Exam Date: 01/16/2010    10-g (5.07) Semmes-Weinstein Monofilament Test Performed by: CMA Student Kenyatta Jeffries          Right Foot          Left Foot Visual Inspection               Test Control      normal         normal Site 1         normal         normal Site 2         normal  normal Site 3         normal         normal Site 4         normal         normal Site 5         normal         normal Site 6         normal         normal Site 7         normal         normal Site 8         normal         normal Site 9         normal         normal Site 10         normal         normal  Impression      normal         normal

## 2010-12-05 NOTE — Assessment & Plan Note (Signed)
Summary: 2 week fu on 311/bp///kt   Vital Signs:  Patient profile:   50 year old female Height:      65 inches Weight:      291 pounds BMI:     48.60 Temp:     97.5 degrees F oral Pulse rate:   83 / minute Pulse rhythm:   regular Resp:     18 per minute BP sitting:   169 / 103  (left arm) Cuff size:   large  Vitals Entered By: [name] (January 30, 2010 11:06 AM)  Serial Vital Signs/Assessments:  Time      Position  BP       Pulse  Resp  Temp     By 11:38 AM            160/98                         Tereso Newcomer PA-C  Comments: 11:38 AM right arm By: Tereso Newcomer PA-C   CC: two week f/u... pt says she has either a cold or sinus infection and would like something for it..., Hypertension Management Is Patient Diabetic? Yes CBG Result 127  Does patient need assistance? Functional Status Self care Ambulation Normal   Primary Care Provider:  Tereso Newcomer PA-C  CC:  two week f/u... pt says she has either a cold or sinus infection and would like something for it... and Hypertension Management.  History of Present Illness: Here for f/u.  Depression:  Met with LCSW.  Feels better.  Never took zoloft.  Does not want to take now.  She thinks she may take in the future. Wants to meet with Marchelle Folks again.  Was told that she did not need counseling.  HTN:  Forgets to take meds sometimes.  Did take meds about 15 mins before coming in today.    Hyperthyroidism:  Gets scan on thyroid tomorrow.  Does have a h/o low TSH in paper chart.  Patient lost to folloow up after that.  Has had some palps.  Does have a h/o heat intolerance but this is not unusual.  She does feel tired.  Hair does feel somewhat thin.  Cold symptoms: Started last week. + sinus pressure + drainage + cough; clear sputum no fevers had URI symptoms 3 weeks before this episode no dental pain + lightheadedness no taste disturbance no chest pain or sob no wheezing + sore throat + otalgia Does note water  brash and indigestion and symptoms worse with lying down.   Hypertension History:      She denies headache, chest pain, dyspnea with exertion, and syncope.        Positive major cardiovascular risk factors include diabetes, hyperlipidemia, and hypertension.  Negative major cardiovascular risk factors include female age less than 42 years old and non-tobacco-user status.     Current Medications (verified): 1)  Allegra 180 Mg Tabs (Fexofenadine Hcl) .... Take One (1) Tablet Daily As Needed For Allergies 2)  Aspir-Low 81 Mg Tbec (Aspirin) .... Take One (1) Tablet Each Day 3)  Crestor 10 Mg Tabs (Rosuvastatin Calcium) .... Take One (1) Tablet By Mouth Every Day 4)  Diovan 320 Mg Tabs (Valsartan) .... Take One (1) Tablet Each Day 5)  Hydrochlorothiazide 25 Mg Tabs (Hydrochlorothiazide) .... Take One (1) Tablet Every Morning 6)  Metformin Hcl 500 Mg Tb24 (Metformin Hcl) .... Take Two (2) Tablets Each Morning 7)  Nasacort Aq 55 Mcg/act Aers (Triamcinolone Acetonide(Nasal)) .Marland KitchenMarland KitchenMarland Kitchen  Use One Spray in Each Nostril Once Daily 8)  Norvasc 10 Mg Tabs (Amlodipine Besylate) .... Take 1 Tablet By Mouth Every Morning 9)  Zoloft 50 Mg Tabs (Sertraline Hcl) .... Take 1 Tablet By Mouth Once A Day 10)  Lopressor 50 Mg Tabs (Metoprolol Tartrate) .... Take 1 Tablet By Mouth Two Times A Day For Blood Pressure  Allergies (verified): No Known Drug Allergies  Physical Exam  General:  alert, well-developed, and well-nourished.   Head:  normocephalic and atraumatic.   Neck:  thyromegaly.   Lungs:  normal breath sounds, no crackles, and no wheezes.   Heart:  normal rate and regular rhythm.   Abdomen:  soft.   Neurologic:  alert & oriented X3 and cranial nerves II-XII intact.   Psych:  normally interactive and good eye contact.     Impression & Recommendations:  Problem # 1:  HYPERTENSION (ICD-401.9) uncontrolled change lopressor to 100 mg two times a day if no better at f/u, add clonidine  Her updated  medication list for this problem includes:    Diovan 320 Mg Tabs (Valsartan) .Marland Kitchen... Take one (1) tablet each day    Hydrochlorothiazide 25 Mg Tabs (Hydrochlorothiazide) .Marland Kitchen... Take one (1) tablet every morning    Norvasc 10 Mg Tabs (Amlodipine besylate) .Marland Kitchen... Take 1 tablet by mouth every morning    Lopressor 50 Mg Tabs (Metoprolol tartrate) .Marland Kitchen... Take 2  tabs by mouth two times a day  for blood pressure (pharmacy note dose change)  Problem # 2:  ALLERGIC RHINITIS, SEASONAL (ICD-477.0) ? cause of cough symptoms cont allegra add flonase  Problem # 3:  GERD (ICD-530.81)  suspect she has some GERD causing her cough will add pepcid to her allegra and flonase  Her updated medication list for this problem includes:    Pepcid 20 Mg Tabs (Famotidine) .Marland Kitchen... Take 1 tablet by mouth two times a day  Problem # 4:  THYROID NODULE (ICD-241.0) hyperthyroid and 2 large nodules scan pending will bring her back for close f/u  Complete Medication List: 1)  Allegra 180 Mg Tabs (Fexofenadine hcl) .... Take one (1) tablet daily as needed for allergies 2)  Aspir-low 81 Mg Tbec (Aspirin) .... Take one (1) tablet each day 3)  Crestor 10 Mg Tabs (Rosuvastatin calcium) .... Take one (1) tablet by mouth every day 4)  Diovan 320 Mg Tabs (Valsartan) .... Take one (1) tablet each day 5)  Hydrochlorothiazide 25 Mg Tabs (Hydrochlorothiazide) .... Take one (1) tablet every morning 6)  Metformin Hcl 500 Mg Tb24 (Metformin hcl) .... Take two (2) tablets each morning 7)  Nasacort Aq 55 Mcg/act Aers (Triamcinolone acetonide(nasal)) .... Use one spray in each nostril once daily 8)  Norvasc 10 Mg Tabs (Amlodipine besylate) .... Take 1 tablet by mouth every morning 9)  Zoloft 50 Mg Tabs (Sertraline hcl) .... Take 1 tablet by mouth once a day 10)  Lopressor 50 Mg Tabs (Metoprolol tartrate) .... Take 2  tabs by mouth two times a day  for blood pressure (pharmacy note dose change) 11)  Flonase 50 Mcg/act Susp (Fluticasone  propionate) .Marland Kitchen.. 1-2 sprays each nostril once daily (use for 3 weeks, then stop for one week) 12)  Pepcid 20 Mg Tabs (Famotidine) .... Take 1 tablet by mouth two times a day  Hypertension Assessment/Plan:      The patient's hypertensive risk group is category C: Target organ damage and/or diabetes.  Today's blood pressure is 169/103.  Her blood pressure goal is < 130/80.  Patient Instructions: 1)  Change Lopressor (metoprolol) to 50 mg take 2 tablets two times a day.  I gave you a new prescription. 2)  Try to cut back on salt.  See handout. 3)  Please schedule a follow-up appointment in 2-3  weeks with Kimmberly Wisser for thyroid and blood pressure.  Prescriptions: PEPCID 20 MG TABS (FAMOTIDINE) Take 1 tablet by mouth two times a day  #60 x 5   Entered and Authorized by:   Tereso Newcomer PA-C   Signed by:   Tereso Newcomer PA-C on 01/30/2010   Method used:   Print then Give to Patient   RxID:   [id number] LOPRESSOR 50 MG TABS (METOPROLOL TARTRATE) Take 2  tabs by mouth two times a day  for blood pressure (pharmacy note dose change)  #120 x 5   Entered and Authorized by:   Tereso Newcomer PA-C   Signed by:   Tereso Newcomer PA-C on 01/30/2010   Method used:   Print then Give to Patient   RxID:   [id number] FLONASE 50 MCG/ACT SUSP (FLUTICASONE PROPIONATE) 1-2 sprays each nostril once daily (use for 3 weeks, then stop for one week)  #1 x 5   Entered and Authorized by:   Tereso Newcomer PA-C   Signed by:   Tereso Newcomer PA-C on 01/30/2010   Method used:   Print then Give to Patient   RxID:   [id number]

## 2010-12-05 NOTE — Letter (Signed)
Summary: RETASURE  RETASURE   Imported By: Arta Bruce 03/28/2010 10:14:59  _____________________________________________________________________  External Attachment:    Type:   Image     Comment:   External Document

## 2010-12-05 NOTE — Progress Notes (Signed)
Summary: Needs CBC with next blood draw  Phone Note Outgoing Call   Summary of Call: Please have her do a CBC with her next blood draw in a few weeks.  Initial call taken by: Tereso Newcomer PA-C,  May 17, 2010 1:35 PM  Follow-up for Phone Call        Lab added to next appt. 06/14/10.  Dutch Quint RN  May 18, 2010 12:46 PM

## 2010-12-05 NOTE — Progress Notes (Signed)
Summary: Chest Pain . . . Needs to see Triage RN today  Phone Note Call from Patient Call back at [facility] Phone [phone]   Summary of Call: Patient called with chest pain.  Started over weekend.  Over right breast.  Pain is constant.   Worse with position changes or movement of her right arm.  Exertion makes better.  No assoc dyspnea or diaph or nausea.  No arm or jaw pain.  Pain better today than yesterday or day before.   No syncope or near syncope.  No palps.  Did not know if she should take tylenol, etc.    Will have her come in to see the triage RN.  Needs ECG.   Initial call taken by: Brynda Rim,  May 15, 2010 9:38 AM  Follow-up for Phone Call        In for triage visit this morning.  Follow-up by: Dutch Quint RN,  May 15, 2010 11:19 AM

## 2010-12-05 NOTE — Progress Notes (Signed)
Summary: Thyroid f/u  Phone Note Outgoing Call   Summary of Call: CBC ok.  Thyroid levels minimally better.  Ask if she is taking methimazole 10 mg 2 tabs three times a day. Has she seen endocrinology at [facility] yet?  If so, ask [facility] to fax me an office note from her visit.   Initial call taken by: Brynda Rim,  June 15, 2010 2:26 PM  Follow-up for Phone Call        Left message on answering machine for pt to call back.Marland KitchenMarland KitchenMarland KitchenArmenia [name]  June 16, 2010 4:33 PM   Left message on answering machine for pt to call back.Marland KitchenMarland KitchenMarland KitchenArmenia [name]  June 19, 2010 9:21 AM   Additional Follow-up for Phone Call Additional follow up Details #1::        spoke with pt and she said she is taking med three times a day and she has seen [facility] Rudell [name] at [facility] on the 5th of this month and goes back on 22nd.... Andrea [name]  June 19, 2010 9:32 AM        Past History:  Past Medical History: Diabetes mellitus, type II Hypertension Hyperlipidemia left rotator cuff syndrome Toxic Multinodular Goiter   a.  placed on Methimazole in April 2011   b.  eval by Dr. Frances [name] (attending) at [facility] 06/2010 . . . RAI treatment planned (f/u 06/26/2010)   c.  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 Take 2  tabs by mouth two times a day  for blood pressure (pharmacy note dose change)    Methimazole 10 Mg Tabs (Methimazole) .Marland Kitchen... Take 2 tablets by mouth three times a day  Complete Medication List: 1)  Allegra 180 Mg Tabs (Fexofenadine hcl) .... Take one (1) tablet daily as needed for allergies 2)  Aspir-low 81 Mg Tbec (Aspirin) .... Take one (1) tablet each day 3)  Diovan 320 Mg Tabs (Valsartan) .... Take one (1) tablet each day 4)  Hydrochlorothiazide 25 Mg Tabs (Hydrochlorothiazide) .... Take one (1)  tablet every morning 5)  Metformin Hcl 500 Mg Tb24 (Metformin hcl) .... Take 1 tablet by mouth two times a day 6)  Nasacort Aq 55 Mcg/act Aers (Triamcinolone acetonide(nasal)) .... Use one spray in each nostril once daily 7)  Norvasc 10 Mg Tabs (Amlodipine besylate) .... Take 1 tablet by mouth every morning 8)  Lopressor 50 Mg Tabs (Metoprolol tartrate) .... Take 2  tabs by mouth two times a day  for blood pressure (pharmacy note dose change) 9)  Flonase 50 Mcg/act Susp (Fluticasone propionate) .Marland Kitchen.. 1-2 sprays each nostril once daily (use for 3 weeks, then stop for one week) 10)  Pepcid 20 Mg Tabs (Famotidine) .... Take 1 tablet by mouth two times a day 11)  Methimazole 10 Mg Tabs (Methimazole) .... Take 2 tablets by mouth three times a day 12)  Alprazolam 0.25 Mg Tabs (Alprazolam) .... Take 1 by mouth once daily as needed for anxiety 13)  Clonidine Hcl 0.2 Mg Tabs (Clonidine hcl) .... Take 1 tablet by mouth two times a day for blood pressure (pharmacy note dose increase) 14)  Pravastatin Sodium 40 Mg Tabs (Pravastatin sodium) .... Take 1 tab by mouth at bedtime

## 2010-12-05 NOTE — Progress Notes (Signed)
  Phone Note Other Incoming   Request: Send information Summary of Call: Request for records received from [facility]. Request forwarded to [facility].     

## 2010-12-05 NOTE — Assessment & Plan Note (Signed)
Summary: BP check///rjp  Nurse Visit   Impression & Recommendations:  Problem # 1:  HYPERTENSION (ICD-401.9) Just started back on meds 2/26 or so.  Missed all of meds yesterday.  Systolic definitely improved, but still high. Pt. to return back in 2 weeks after taking meds daily without missing for bp recheck. Her updated medication list for this problem includes:    Diovan 320 Mg Tabs (Valsartan) .Marland Kitchen... Take one (1) tablet each day    Hydrochlorothiazide 25 Mg Tabs (Hydrochlorothiazide) .Marland Kitchen... Take one (1) tablet every morning    Norvasc 5 Mg Tabs (Amlodipine besylate) .Marland Kitchen... Take one (1) tablet each day for blood pressure   Complete Medication List: 1)  Allegra 180 Mg Tabs (Fexofenadine hcl) .... Take one (1) tablet daily as needed for allergies 2)  Aspir-low 81 Mg Tbec (Aspirin) .... Take one (1) tablet each day 3)  Crestor 10 Mg Tabs (Rosuvastatin calcium) .... Take one (1) tablet by mouth every day 4)  Diovan 320 Mg Tabs (Valsartan) .... Take one (1) tablet each day 5)  Hydrochlorothiazide 25 Mg Tabs (Hydrochlorothiazide) .... Take one (1) tablet every morning 6)  Metformin Hcl 500 Mg Tb24 (Metformin hcl) .... Take two (2) tablets each morning 7)  Nasacort Aq 55 Mcg/act Aers (Triamcinolone acetonide(nasal)) .... Use one spray in each nostril once daily 8)  Norvasc 5 Mg Tabs (Amlodipine besylate) .... Take one (1) tablet each day for blood pressure   Patient Instructions: 1)  Return for BP check in 2 weeks--nurse visit.   Vital Signs:  Patient Profile:   50 Years Old Female BP sitting:   160 / 100  (left arm) Cuff size:   large                 Prior Medications: ALLEGRA 180 MG TABS (FEXOFENADINE HCL) TAKE ONE (1) TABLET DAILY AS NEEDED FOR ALLERGIES ASPIR-LOW 81 MG TBEC (ASPIRIN) TAKE ONE (1) TABLET EACH DAY CRESTOR 10 MG TABS (ROSUVASTATIN CALCIUM) TAKE ONE (1) TABLET BY MOUTH EVERY DAY DIOVAN 320 MG TABS (VALSARTAN) TAKE ONE (1) TABLET EACH DAY HYDROCHLOROTHIAZIDE 25  MG TABS (HYDROCHLOROTHIAZIDE) TAKE ONE (1) TABLET EVERY MORNING METFORMIN HCL 500 MG TB24 (METFORMIN HCL) TAKE TWO (2) TABLETS EACH MORNING NASACORT AQ 55 MCG/ACT AERS (TRIAMCINOLONE ACETONIDE(NASAL)) USE ONE SPRAY IN EACH NOSTRIL ONCE DAILY NORVASC 5 MG TABS (AMLODIPINE BESYLATE) TAKE ONE (1) TABLET EACH DAY FOR BLOOD PRESSURE       ]

## 2010-12-07 ENCOUNTER — Encounter (INDEPENDENT_AMBULATORY_CARE_PROVIDER_SITE_OTHER): Payer: Self-pay | Admitting: Internal Medicine

## 2010-12-07 NOTE — Progress Notes (Signed)
Summary: [facility] NEEDS NEW REF  Phone Note Call from Patient Call back at Home Phone [phone]   Reason for Call: Referral Summary of Call: [facility] pt. [place] WHO HAS PROBLEMS WITH HER THYROID, WOKE UP WITH A MOUTH FULL OF BLOOD, SHE CALLED [facility] TO SEE IF THEY WOULD SCHEDULE HER TO COME BACK IN, AND THEY TOLD HER THAT SHE WOULD HAVE TO CALL BACK HERE TO GET ANOTHER REFERRAL, BECAUSE SHE WAS ALSO SUPOSE TO HAVE HAD SURGERY BACK IN JULY THIS YEAR BUT SHE HAD TO POSTPONE IT.  SHE IS RUNNING A FEVER AND SHE SAYS HER NECK REALLY HURTS. Initial call taken by: Leodis Rains,  October 18, 2010 11:55 AM  Follow-up for Phone Call        Please call pt. and get more info--?she awakened with a mouthful of blood?  Has this happened before?  Could she see where it was coming from? Did she ever go for the radioactive thyroid ablation?  If not, did she restart her Methimazole? Follow-up by: Julieanne Manson MD,  October 20, 2010 6:01 AM  Additional Follow-up for Phone Call Additional follow up Details #1::        Left message on answering machine for pt to call back.Marland KitchenMarland KitchenMarland KitchenArmenia Shannon  October 20, 2010 11:40 AM   pt says she has blood in her mouth but she doesnt know where its coming from.[name] says she thinks its her gums thats bleeding.... pt says she order the methimazole six days ago..... [facility] told pt to stop med but she was still getting a refill ... pt says she did not get her other xray done so she was not taking anything...Marland KitchenMarland Kitchen pt says she was awakened with a mouth full of blood and this was the first time... pt says she really thinks gums and wants a referral to denist and updated endocrininlolgy referral... Additional Follow-up by: [name],  October 23, 2010 9:10 AM    Additional Follow-up for Phone Call Additional follow up Details #2::    She needs to restart the Tapazole until we can get her back in--will not set that up until she is seen again and can discuss why not  following up. She needs to be seen before can send her to dentist  Julieanne Manson MD  October 25, 2010 1:18 PM    tried calling pt but no answer .Marland KitchenMarland KitchenArmenia Shannon  October 26, 2010 11:14 AM   Left message on answering machine for pt to call back...Marland KitchenMarland KitchenArmenia Shannon  October 27, 2010 8:46 AM   Left message on answering machine for pt to call back..will mail letter.Marland KitchenMarland KitchenMarland KitchenArmenia Shannon  October 31, 2010 11:15 AM

## 2010-12-07 NOTE — Letter (Signed)
Summary: MAILED REQUESTED RECORDS TO [facility]  MAILED REQUESTED RECORDS TO [facility]   Imported By: Arta Bruce 12/01/2010 09:28:43  _____________________________________________________________________  External Attachment:    Type:   Image     Comment:   External Document

## 2010-12-07 NOTE — Letter (Signed)
Summary: *HSN Results Follow up  [facility]  [address] [place], [place] [address]   Phone: [phone]  Fax: [phone]      10/31/2010   CODA FILLER [address] [place], [place]  [address]   Dear  Ms. [name],                            ____S.Drinkard,FNP   ____D. Gore,FNP       ____B. McPherson,MD   ____V. Rankins,MD    ____E. Avantika Shere,MD    ____N. Daphine Deutscher, FNP  ____D. Reche Dixon, MD    ____K. Philipp Deputy, MD    ____Other     This letter is to inform you that your recent test(s):  _______Pap Smear    _______Lab Test     _______X-ray    _______ is within acceptable limits  _______ requires a medication change  _______ requires a follow-up lab visit  ___X____ requires a follow-up visit with your provider   Comments:  We have been trying to reach you.  Please give the office a call.       _________________________________________________________ If you have any questions, please contact our office                     Sincerely,  [name] [facility]

## 2010-12-13 NOTE — Progress Notes (Signed)
Summary: dental and endocrine referral  Phone Note Outgoing Call   Summary of Call: Nora--can you check with Endocrine at [facility] they want to see her without starting the Tapazole?  Can they get her in soon for the radioactive iodine ablation?   Or do they want [place] to get her started back on meds again and schedule her for a later date--after they see her again?  Need to know soon as to whether to tell pt. to restart medication or not. Needs dental referral as well--on abx Initial call taken by: Julieanne Manson MD,  December 05, 2010 1:36 PM  Follow-up for Phone Call        *SEND A REFERRAL TO [facility]  THEY WILL CONTACT THE PT TO MADE AN APPT.Marland KitchenCheryll Dessert  December 05, 2010 4:08 PM  I call [facility] Endocrinologist and they transfer me to Nuclear medicine Radioactive and they want the ptto stay off a week of tsh medicine and I look the notes and she is not taking tapazole since december so is ok to schedule and her appt will be 12-14-10 @ 8:45 am npo after midnight . I  spoke to the pt and she is aware of her appt and the npo and the medicine.  Follow-up by: Cheryll Dessert,  December 06, 2010 10:45 AM

## 2010-12-13 NOTE — Miscellaneous (Signed)
Summary: Office Visit ([facility])    Vital Signs:  Patient profile:   50 year old female Weight:      275.56 pounds Temp:     97.5 degrees F oral Pulse rate:   72 / minute Pulse rhythm:   regular Resp:     26 per minute BP sitting:   158 / 102  (left arm) Cuff size:   large  Vitals Entered By: Hale Drone CMA (December 05, 2010 12:11 PM) CC: Having thyroid concerns. Right side of neck has been swollen x1 month. No pain associated. Just has a sore throat. Also spitting up blood but doesn't know where its coming from. Denies fever, vomiting, naseaus, and diarrhea.  Is Patient Diabetic? Yes Pain Assessment Patient in pain? no      CBG Result 154 CBG Device ID B non fasting  Does patient need assistance? Functional Status Self care Ambulation Normal   Primary Care Provider:  Tereso Newcomer PA-C  CC:  Having thyroid concerns. Right side of neck has been swollen x1 month. No pain associated. Just has a sore throat. Also spitting up blood but doesn't know where its coming from. Denies fever, vomiting, naseaus, and and diarrhea. .  History of Present Illness: 1.  Toxic multinodular goiter:  pt. was referred to [facility] endocrinology.  Pt. was to undergo what sounds like radioactive iodine ablation of her thyroid, but lost her transportation and she states, cancelled the procedure and follow up visit.  She was off her Tapazole when discussed by phone message in December.  Pt. still has not filled.  Pt. states she is having swelling and discomfort in thryoid area.  Swallowing okay- no choking or dysphagia.  No difficulties getting air.   Was on Tapazole 20 mg three times a day prior to stopping in August.   No definite weight loss, jitteriness, palpitations, diarrhea. Does have dependable transportation now.  2.  Intermittent blood mouth:  Only notes when awakens in morning.  No bleeding during day.  Has a mouthful of bad teeth and gum disease--she feels it is coming from that.  Never  coughs up any blood.  No bloody noses.  Current Medications (verified): 1)  Allegra 180 Mg Tabs (Fexofenadine Hcl) .... Take One (1) Tablet Daily As Needed For Allergies 2)  Aspir-Low 81 Mg Tbec (Aspirin) .... Take One (1) Tablet Each Day 3)  Diovan 320 Mg Tabs (Valsartan) .... Take One (1) Tablet Each Day 4)  Hydrochlorothiazide 25 Mg Tabs (Hydrochlorothiazide) .... Take One (1) Tablet Every Morning 5)  Metformin Hcl 1000 Mg Tabs (Metformin Hcl) .... Take 1 Tablet By Mouth Two Times A Day (Dose Increased) 6)  Nasacort Aq 55 Mcg/act Aers (Triamcinolone Acetonide(Nasal)) .... Use One Spray in Each Nostril Once Daily 7)  Norvasc 10 Mg Tabs (Amlodipine Besylate) .... Take 1 Tablet By Mouth Every Morning 8)  Lopressor 50 Mg Tabs (Metoprolol Tartrate) .... Take 2  Tabs By Mouth Two Times A Day  For Blood Pressure (Pharmacy Note Dose Change) 9)  Flonase 50 Mcg/act Susp (Fluticasone Propionate) .Marland Kitchen.. 1-2 Sprays Each Nostril Once Daily (Use For 3 Weeks, Then Stop For One Week) 10)  Methimazole 10 Mg Tabs (Methimazole) .... Stop Taking - Per [facility] Endocrinologist 11)  Alprazolam 0.25 Mg Tabs (Alprazolam) .... Take 1 By Mouth Once Daily As Needed For Anxiety 12)  Clonidine Hcl 0.2 Mg Tabs (Clonidine Hcl) .... Take 1 Tablet By Mouth Two Times A Day For Blood Pressure (Pharmacy Note Dose Increase) 13)  Pravastatin Sodium 40 Mg Tabs (Pravastatin Sodium) .... Take 1 Tab By Mouth At Bedtime 14)  Glucose Meter .... Check Sugars Once Daily 15)  Glucose Meter Strips .... Check Sugar Once Daily 16)  Lancets .... Check Sugar Once Daily  Allergies (verified): No Known Drug Allergies    Physical Exam  Mouth:  Pt. unable to remove partial--one broken tooth show through in upper gum with surrounding gingival inflammation. Neck:  Large, nodular goiter--more prounounce to right.  No bruit Lungs:  Normal respiratory effort, chest expands symmetrically. Lungs are clear to auscultation, no crackles or wheezes. Heart:   Normal rate and regular rhythm. S1 and S2 normal without gallop, murmur, click, rub or other extra sounds.  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 Take 2  tabs by mouth two times a day  for blood pressure (pharmacy note dose change)    Methimazole 10 Mg Tabs (Methimazole) .Marland Kitchen... 2 tabs by mouth three times a day  Orders: Endocrinology Referral (Endocrine) T-TSH [phone]) T-T4, Free [phone]) T- * Misc. Laboratory test [phone])  Complete Medication List: 1)  Allegra 180 Mg Tabs (Fexofenadine hcl) .... Take one (1) tablet daily as needed for allergies 2)  Aspir-low 81 Mg Tbec (Aspirin) .... Take one (1) tablet each day 3)  Diovan 320 Mg Tabs (Valsartan) .... Take one (1) tablet each day 4)  Hydrochlorothiazide 25 Mg Tabs (Hydrochlorothiazide) .... Take one (1) tablet every morning 5)  Metformin Hcl 1000 Mg Tabs (Metformin hcl) .... Take 1 tablet by mouth two times a day (dose increased) 6)  Nasacort Aq 55 Mcg/act Aers (Triamcinolone acetonide(nasal)) .... Use one spray in each nostril once daily 7)  Norvasc 10 Mg Tabs (Amlodipine besylate) .... Take 1 tablet by mouth every morning 8)  Lopressor 50 Mg Tabs (Metoprolol tartrate) .... Take 2  tabs by mouth two times a day  for blood pressure (pharmacy note dose change) 9)  Flonase 50 Mcg/act Susp (Fluticasone propionate) .Marland Kitchen.. 1-2 sprays each nostril once daily (use for 3 weeks, then stop for one week) 10)  Methimazole 10 Mg Tabs (Methimazole) .... 2 tabs by mouth three times  a day 11)  Alprazolam 0.25 Mg Tabs (Alprazolam) .... Take 1 by mouth once daily as needed for anxiety 12)  Clonidine Hcl 0.2 Mg Tabs (Clonidine hcl) .... Take 1 tablet by mouth two times a day for blood pressure (pharmacy note dose increase) 13)  Pravastatin Sodium 40 Mg Tabs (Pravastatin sodium) .... Take 1 tab by mouth at bedtime 14)  Glucose Meter  .... Check sugars once daily 15)  Glucose Meter Strips  .... Check sugar once daily 16)  Lancets  .... Check sugar once daily 17)  Penicillin V Potassium 250 Mg Tabs (Penicillin v potassium) .Marland Kitchen.. 1 tab by mouth 4 times daily for 10 days  Other Orders: Capillary Blood Glucose/CBG (82948) Hgb A1C (13086VH)   Patient Instructions: 1)  Lab visit in 1 month for TSH,  Free T4 and Free T3 2)  Follow up with Dr. Delrae Alfred in 2 months --thyroid Prescriptions: PENICILLIN V POTASSIUM 250 MG TABS (PENICILLIN V POTASSIUM) 1 tab by mouth 4 times daily for 10 days  #40 x 0   Entered and Authorized by:   Julieanne Manson MD   Signed by:   Julieanne Manson MD on 12/05/2010   Method used:   Faxed to ...       [facility] (retail)       [address] [place], [place]  [address]       Ph: [phone]       Fax: [phone]   RxID:   [id number] METHIMAZOLE 10 MG TABS (METHIMAZOLE) 2 tabs by mouth three times a day  #180 x 2   Entered and Authorized by:   Julieanne Manson MD   Signed by:   Julieanne Manson MD on 12/05/2010   Method used:   Faxed to ...       [facility] (retail)       [address] [place], [place]  [address]       Ph: [phone]       Fax: [phone]   RxID:   [id number]  ]  Not Administered:    Influenza Vaccine not given due to: declined    Diabetic Foot Exam Last Podiatry Exam Date: 01/16/2010    10-g (5.07) Semmes-Weinstein Monofilament Test Performed by: Hale Drone CMA          Right Foot          Left  Foot Visual Inspection     normal         normal Test Control      normal         normal Site 1         normal         normal Site 2         normal         normal Site 3         normal         normal Site 4         normal         normal Site 5         normal         normal Site 6         normal         normal Site 7         normal         normal Site 8         normal         normal Site 9         normal         normal Site 10         normal         normal  Impression      normal         normal  Laboratory Results   Blood Tests   Date/Time Received: December 05, 2010 12:39 PM   HGBA1C: 7.6%   (Normal Range: Non-Diabetic - 3-6%   Control Diabetic - 6-8%) CBG Random:: 154mg /dL

## 2010-12-13 NOTE — Letter (Signed)
Summary: IMMUNIZATION RECORD  IMMUNIZATION RECORD   Imported By: Arta Bruce 12/07/2010 10:38:00  _____________________________________________________________________  External Attachment:    Type:   Image     Comment:   External Document

## 2010-12-18 ENCOUNTER — Telehealth (INDEPENDENT_AMBULATORY_CARE_PROVIDER_SITE_OTHER): Payer: Self-pay | Admitting: Internal Medicine

## 2010-12-23 ENCOUNTER — Emergency Department (HOSPITAL_COMMUNITY)
Admission: EM | Admit: 2010-12-23 | Discharge: 2010-12-24 | Disposition: A | Discharge status: Home / Self Care | Payer: Medicaid Other | Attending: Emergency Medicine | Admitting: Emergency Medicine

## 2010-12-23 DIAGNOSIS — E119 Type 2 diabetes mellitus without complications: Secondary | ICD-10-CM | POA: Insufficient documentation

## 2010-12-23 DIAGNOSIS — J4489 Other specified chronic obstructive pulmonary disease: Secondary | ICD-10-CM | POA: Insufficient documentation

## 2010-12-23 DIAGNOSIS — Z79899 Other long term (current) drug therapy: Secondary | ICD-10-CM | POA: Insufficient documentation

## 2010-12-23 DIAGNOSIS — R109 Unspecified abdominal pain: Secondary | ICD-10-CM | POA: Insufficient documentation

## 2010-12-23 DIAGNOSIS — I10 Essential (primary) hypertension: Secondary | ICD-10-CM | POA: Insufficient documentation

## 2010-12-23 DIAGNOSIS — R000 Tachycardia, unspecified: Secondary | ICD-10-CM | POA: Insufficient documentation

## 2010-12-23 DIAGNOSIS — J449 Chronic obstructive pulmonary disease, unspecified: Secondary | ICD-10-CM | POA: Insufficient documentation

## 2010-12-24 LAB — URINALYSIS, ROUTINE W REFLEX MICROSCOPIC
Bilirubin Urine: NEGATIVE
Ketones, ur: NEGATIVE mg/dL
Specific Gravity, Urine: 1.016 (ref 1.005–1.030)
Urine Glucose, Fasting: NEGATIVE mg/dL
Urobilinogen, UA: 1 mg/dL (ref 0.0–1.0)

## 2010-12-24 LAB — CBC
HCT: 39.1 % (ref 36.0–46.0)
MCH: 26.5 pg (ref 26.0–34.0)
MCHC: 34.5 g/dL (ref 30.0–36.0)
MCV: 76.8 fL — ABNORMAL LOW (ref 78.0–100.0)
Platelets: 289 10*3/uL (ref 150–400)
RDW: 13.2 % (ref 11.5–15.5)

## 2010-12-24 LAB — DIFFERENTIAL
Lymphs Abs: 3.3 10*3/uL (ref 0.7–4.0)
Neutrophils Relative %: 54 % (ref 43–77)

## 2010-12-24 LAB — URINE MICROSCOPIC-ADD ON

## 2010-12-24 LAB — POCT I-STAT, CHEM 8
BUN: 9 mg/dL (ref 6–23)
Chloride: 108 mEq/L (ref 96–112)
Glucose, Bld: 192 mg/dL — ABNORMAL HIGH (ref 70–99)

## 2010-12-27 NOTE — Progress Notes (Signed)
Summary: radioactive iodine rx  Phone Note Outgoing Call   Summary of Call: Ramon--please call pt. and see if she received the radioactive iodine treatment on the 9th and if she has follow up with endocrinology at [facility] in near future. Initial call taken by: Julieanne Manson MD,  December 18, 2010 2:46 PM  Follow-up for Phone Call        [phone] Left message on answer machine for pt. to return call. [phone] no longer works there. Gaylyn Cheers RN  December 19, 2010 1:52 PM   Additional Follow-up for Phone Call Additional follow up Details #1::        pt says she did radioactive treatment today and goes back tomorrow for follow up and more test  Additional Follow-up by: [name],  December 19, 2010 4:54 PM

## 2011-01-16 NOTE — Letter (Signed)
Summary: RECORDS FROM OCCUMED WALK IN CLINIC  RECORDS FROM OCCUMED WALK IN CLINIC   Imported By: Arta Bruce 01/08/2011 12:54:45  _____________________________________________________________________  External Attachment:    Type:   Image     Comment:   External Document

## 2011-02-13 LAB — POCT I-STAT, CHEM 8
Chloride: 106 mEq/L (ref 96–112)
Creatinine, Ser: 0.7 mg/dL (ref 0.4–1.2)
Potassium: 3.7 mEq/L (ref 3.5–5.1)
Sodium: 141 mEq/L (ref 135–145)

## 2011-02-13 LAB — GLUCOSE, CAPILLARY: Glucose-Capillary: 131 mg/dL — ABNORMAL HIGH (ref 70–99)

## 2011-03-23 NOTE — Op Note (Signed)
[facility]  Patient:    [name], Andrea [name]                      MRN: [id number] Proc. Date: 02/20/00 Adm. Date:  [id number] Attending:  Deniece Ree                           Operative Report  PREOPERATIVE DIAGNOSIS:       Intrauterine pregnancy at term.  Previous cesarean section.  Rupture of membranes.  In active labor.  POSTOPERATIVE DIAGNOSIS:      Intrauterine pregnancy at term.  Previous cesarean section.  Rupture of membranes.  In active labor.  Plus a viable female infant with Apgars of 9 and 9.  OPERATION:                    Repeat cesarean section.  SURGEON:                      Deniece Ree, M.D.  ASSISTANT:  ANESTHESIA:                   Spinal.  PEDIATRICS:                   Teaching Service.  ESTIMATED BLOOD LOSS:         300 cc.  DRAINS:                       Foley was left to straight drainage.  CONDITION:                    The patient tolerated the procedure well and returned to the recovery room in satisfactory condition.  DESCRIPTION OF PROCEDURE:     The patient was taken to the operating room and prepped and draped in the usual fashion for a repeat cesarean section.  A low Pfannenstiel incision was made following the course of the previous incision. his was carried down to the fascia at which time the fascia was incised the extent f the incision.  The midline was identified and the rectus muscles separated. The abdominal peritoneum was then entered in a vertical fashion using Metzenbaum scissors.  The visceroperitoneum was then excised bilaterally toward the round ligaments following which the lower uterine segment was scored, entered in the midline, and bluntly dissected open.  Due to some difficulty in delivering the infants head, the Mity-Vac was then utilized for assistance which the infants head was delivered without any problems.  There was a nuchal cord present and a  cord around the upper part of the  body.  The nasal and pharynx was then sucked ut. However, prior to that, a DeLee was utilized removing approximately 3 to 4 cc of fluid.  Then the nasal passages were sucked out with the suction bulb. Complete delivery was then carried out without any problems.  The cord was clamped and the infant turned over to the pediatrician, J. Alphonsa Gin, M.D., who was in attendance.  Cord pH was obtained which returned 7.27.  The placenta as well as all products of conception were then manually removed from the uterine cavity.  IV antibiotics and Pitocin was begun.  The myometrium was then closed using #1 chromic in a running locking stitch followed by an imbricating stitch again using #1 chromic.  Reperitonealization was then carried out using 2-0 chromic in  a running stitch.  Sponge, needle, and instrument counts were correct x 2.  Hemostasis was present.  Tubes and ovaries bilaterally appeared to be within normal limits. The abdominal peritoneum was then closed using 2-0 chromic in a running stitch followed by closure of the fascia using #1 Dexon in a running stitch.  The skin was closed with skin staples.  The procedure was terminated.  The patient tolerated the procedure well and returned to the recovery room in satisfactory condition. DD:  02/20/00 TD:  02/20/00 Job: [id number]

## 2011-03-23 NOTE — Discharge Summary (Signed)
[facility]  Patient:    NAVIKA, HOOPES                      MRN: [id number] Adm. Date:  [id number] Disc. Date: [id number] Attending:  Deniece Ree                           Discharge Summary  DISCHARGE DIAGNOSES:          1. Intrauterine pregnancy at term.  PROCEDURE:                    Repeat cesarean section.  HISTORY OF PRESENT ILLNESS:   The patient is a 50 year old, gravida 3, para 2, ho had an estimated date of confinement of February 17, 2000.  The patient was admitted in adequate labor at which time she demanded a repeat cesarean section.  HOSPITAL COURSE:              This was carried out on the day of admission. She had a female infant with Apgars of 9 and 9, cord pH of 7.27.  Postoperatively, the patient did very well without any complications and was discharged on the third  postoperative day.  She was instructed again on the possible complications and are following this type of surgery.  She was told to return to my office in four weeks for follow-up evaluation or to call me prior to that time should any problems arise. DD:  03/13/00 TD:  03/14/00 Job: [id number]

## 2011-03-23 NOTE — H&P (Signed)
[facility]  Patient:    Andrea [name], Andrea [name]                      MRN: [id number] Adm. Date:  [id number] Attending:  Deniece Ree                         History and Physical  HISTORY OF PRESENT ILLNESS:   The patient is a 50 year old gravida 3, para 2, with an estimated date of confinement of February 17, 2000.  The patient came to triage  complaining of leaking fluid and having contractions.  The patient has had a previous cesarean section and, at this time, is desirous of a repeat cesarean section.  On evaluation it was noted that the patient was having uterine contractions every three to four minutes of adequate quality.  Moderate amount f meconium was present, and the patient was very adamant about having a repeat cesarean section.  PHYSICAL EXAMINATION:  GENERAL:                      Well-developed, well-nourished gravid female in labor-type distress.  HEENT:                        Within normal limits.  NECK:                         Supple.  LUNGS:                        Clear to percussion and auscultation.  HEART:                        Normal sinus rhythm, without murmur, rub, or gallop.  ABDOMEN:                      Term, gravid.  Fetal heartbeat is 136 ______ in the right lower quadrant.  EXTREMITIES:                  Within normal limits.  NEUROLOGIC:                   Within normal limits.  PELVIC:                       External genitalia, BUS to be within normal limits. The vaginal had meconium-stained fluid present.  The cervix was approximately 5 cm dilated, vertex presentation, at a -1 station.  DIAGNOSES:                    1. Intrauterine pregnancy at term.                               2. Previous cesarean section, requesting a repeat                                  cesarean section.  PLAN:                         Repeat cesarean section.  ADDENDUM:  The patient is taking Zestoretic 1 tablet  q.d. for hypertension. DD:  02/21/00 TD:  02/21/00 Job: [id number]

## 2011-09-24 ENCOUNTER — Emergency Department (INDEPENDENT_AMBULATORY_CARE_PROVIDER_SITE_OTHER)
Admission: EM | Admit: 2011-09-24 | Discharge: 2011-09-24 | Disposition: A | Discharge status: Home / Self Care | Payer: 59 | Source: Home / Self Care

## 2011-09-24 DIAGNOSIS — I10 Essential (primary) hypertension: Secondary | ICD-10-CM

## 2011-09-24 DIAGNOSIS — J019 Acute sinusitis, unspecified: Secondary | ICD-10-CM

## 2011-09-24 DIAGNOSIS — E7800 Pure hypercholesterolemia, unspecified: Secondary | ICD-10-CM | POA: Insufficient documentation

## 2011-09-24 DIAGNOSIS — E119 Type 2 diabetes mellitus without complications: Secondary | ICD-10-CM

## 2011-09-24 HISTORY — DX: Pure hypercholesterolemia, unspecified: E78.00

## 2011-09-24 HISTORY — DX: Essential (primary) hypertension: I10

## 2011-09-24 LAB — POCT I-STAT, CHEM 8
BUN: 13 mg/dL (ref 6–23)
Calcium, Ion: 1.31 mmol/L (ref 1.12–1.32)
Creatinine, Ser: 0.9 mg/dL (ref 0.50–1.10)
Glucose, Bld: 173 mg/dL — ABNORMAL HIGH (ref 70–99)
Hemoglobin: 15.3 g/dL — ABNORMAL HIGH (ref 12.0–15.0)
Sodium: 138 mEq/L (ref 135–145)
TCO2: 28 mmol/L (ref 0–100)

## 2011-09-24 MED ORDER — VALSARTAN 320 MG PO TABS: 320.0000 mg | ORAL_TABLET | Freq: Every day | ORAL | Status: DC

## 2011-09-24 MED ORDER — AMLODIPINE BESYLATE 10 MG PO TABS: 10.0000 mg | ORAL_TABLET | Freq: Every day | ORAL | Status: DC

## 2011-09-24 MED ORDER — AMOXICILLIN 875 MG PO TABS: 875.0000 mg | ORAL_TABLET | Freq: Two times a day (BID) | ORAL | Status: AC

## 2011-09-24 MED ORDER — METOPROLOL TARTRATE 100 MG PO TABS: 50.0000 mg | ORAL_TABLET | Freq: Two times a day (BID) | ORAL | Status: DC

## 2011-09-24 MED ORDER — CLONIDINE HCL 0.2 MG PO TABS: 0.1000 mg | ORAL_TABLET | Freq: Two times a day (BID) | ORAL | Status: DC

## 2011-09-24 MED ORDER — METFORMIN HCL 1000 MG PO TABS: 1000.0000 mg | ORAL_TABLET | Freq: Two times a day (BID) | ORAL | Status: DC

## 2011-09-24 NOTE — ED Notes (Signed)
Patient given discharge instructions by Esperanza Sheets PA, expressed understanding and had all questions answered.  Pt given RX x 1 and 5 were e-prescribed

## 2011-09-24 NOTE — ED Notes (Signed)
C/o 2 week duration of URI type syx; minimal relief w OTC medications; also c/o she is nearly out of DM medications, and is out of all her BP meds ; cannot be seen in [facility]

## 2011-09-24 NOTE — ED Provider Notes (Signed)
History     CSN: [id number] Arrival date & time: 09/24/2011  1:28 PM   None     Chief Complaint  Patient presents with  . URI    (Consider location/radiation/quality/duration/timing/severity/associated sxs/prior treatment) HPI Comments: Pt has a hx of DM and HTN. She ran out of some of her HTN meds approx 3 mos ago, and the others in the last few days, and is nearly out of her DM meds. She was previously a pt at [facility] but states she was released from the practice when she obtained Medicaid insurance. She has been unable to find a Medicaid PCP.   Patient is a 50 y.o. female presenting with URI. The history is provided by the patient.  URI The primary symptoms include ear pain, sore throat and cough. Primary symptoms do not include fever, headaches, swollen glands or wheezing. Primary symptoms comment: nasal congstion & yellow nasal mucus Episode onset: 2 weeks. This is a new problem. The problem has been gradually worsening.  The ear pain began more than 2 days ago. Ear pain is a new problem. Both ears are affected. The pain is mild.    The sore throat began more than 2 days ago. The sore throat has been unchanged since its onset. The sore throat is mild in intensity. The sore throat is not accompanied by trouble swallowing or hoarse voice.  The cough began more than 1 week ago. The cough is new. The cough is productive. The sputum is yellow and brown.  Symptoms associated with the illness include congestion and rhinorrhea. The illness is not associated with chills or sinus pressure. Risk factors for severe complications from URI include diabetes mellitus.  Pt has tried otc cold and cough medication without improvement.   Past Medical History  Diagnosis Date  . Hypertension   . Diabetes mellitus   . High cholesterol     History reviewed. No pertinent past surgical history.  History reviewed. No pertinent family history.  History  Substance Use Topics  . Smoking status:  Never Smoker   . Smokeless tobacco: Not on file  . Alcohol Use: No    OB History    Grav Para Term Preterm Abortions TAB SAB Ect Mult Living                  Review of Systems  Constitutional: Negative for fever and chills.  HENT: Positive for ear pain, congestion, sore throat and rhinorrhea. Negative for hoarse voice, trouble swallowing, sinus pressure and ear discharge.   Respiratory: Positive for cough. Negative for wheezing.   Cardiovascular: Negative for chest pain.  Neurological: Negative for headaches.    Allergies  Review of patient's allergies indicates no known allergies.  Home Medications   Current Outpatient Rx  Name Route Sig Dispense Refill  . AMLODIPINE BESYLATE 10 MG PO TABS Oral Take 10 mg by mouth daily.      . ATORVASTATIN CALCIUM 10 MG PO TABS Oral Take 10 mg by mouth daily.      Marland Kitchen CLONIDINE HCL 0.1 MG PO TABS Oral Take 0.1 mg by mouth 2 (two) times daily.      Marland Kitchen METFORMIN HCL ER (OSM) 1000 MG PO TB24 Oral Take 1,000 mg by mouth daily with breakfast.      . METOPROLOL SUCCINATE 50 MG PO TB24 Oral Take 50 mg by mouth daily.      Marland Kitchen VALSARTAN 320 MG PO TABS Oral Take 320 mg by mouth daily. Ran out  BP 187/110  Pulse 70  Temp(Src) 98.8 F (37.1 C) (Oral)  Resp 20  SpO2 97%  Physical Exam  Nursing note and vitals reviewed. Constitutional: She appears well-developed and well-nourished. No distress.  HENT:  Head: Normocephalic and atraumatic.  Right Ear: Tympanic membrane, external ear and ear canal normal.  Left Ear: Tympanic membrane, external ear and ear canal normal.  Nose: Nose normal.  Mouth/Throat: Uvula is midline, oropharynx is clear and moist and mucous membranes are normal. No oropharyngeal exudate, posterior oropharyngeal edema or posterior oropharyngeal erythema.  Neck: Neck supple.  Cardiovascular: Normal rate, regular rhythm and normal heart sounds.   Pulmonary/Chest: Effort normal and breath sounds normal. No respiratory distress.    Lymphadenopathy:    She has no cervical adenopathy.  Neurological: She is alert.  Skin: Skin is warm and dry.  Psychiatric: She has a normal mood and affect.    ED Course  Procedures (including critical care time)   Labs Reviewed  I-STAT, CHEM 8   No results found.   No diagnosis found.    MDM  Labs reviewed.         Melody Comas, [place] 09/24/11 [phone]

## 2011-09-24 NOTE — ED Provider Notes (Signed)
Medical screening examination/treatment/procedure(s) were performed by non-physician practitioner and as supervising physician I was immediately available for consultation/collaboration.   Barkley Bruns MD.    Barkley Bruns, MD 09/24/11 2045

## 2011-12-29 ENCOUNTER — Other Ambulatory Visit: Payer: Self-pay

## 2011-12-29 ENCOUNTER — Inpatient Hospital Stay (HOSPITAL_COMMUNITY)
Admission: EM | Admit: 2011-12-29 | Discharge: 2011-12-31 | DRG: 305 | Disposition: A | Discharge status: Home / Self Care | Payer: Medicaid Other | Source: Ambulatory Visit | Attending: Internal Medicine | Admitting: Internal Medicine

## 2011-12-29 ENCOUNTER — Encounter (HOSPITAL_COMMUNITY): Payer: Self-pay | Admitting: Emergency Medicine

## 2011-12-29 ENCOUNTER — Emergency Department (HOSPITAL_COMMUNITY): Payer: Medicaid Other

## 2011-12-29 DIAGNOSIS — Z9119 Patient's noncompliance with other medical treatment and regimen: Secondary | ICD-10-CM

## 2011-12-29 DIAGNOSIS — I10 Essential (primary) hypertension: Principal | ICD-10-CM | POA: Diagnosis present

## 2011-12-29 DIAGNOSIS — Z6841 Body Mass Index (BMI) 40.0 and over, adult: Secondary | ICD-10-CM

## 2011-12-29 DIAGNOSIS — E871 Hypo-osmolality and hyponatremia: Secondary | ICD-10-CM | POA: Diagnosis present

## 2011-12-29 DIAGNOSIS — Z23 Encounter for immunization: Secondary | ICD-10-CM

## 2011-12-29 DIAGNOSIS — R0602 Shortness of breath: Secondary | ICD-10-CM

## 2011-12-29 DIAGNOSIS — E119 Type 2 diabetes mellitus without complications: Secondary | ICD-10-CM | POA: Diagnosis present

## 2011-12-29 DIAGNOSIS — I5083 High output heart failure: Secondary | ICD-10-CM | POA: Diagnosis present

## 2011-12-29 DIAGNOSIS — Z91199 Patient's noncompliance with other medical treatment and regimen due to unspecified reason: Secondary | ICD-10-CM

## 2011-12-29 DIAGNOSIS — E0590 Thyrotoxicosis, unspecified without thyrotoxic crisis or storm: Secondary | ICD-10-CM | POA: Diagnosis present

## 2011-12-29 DIAGNOSIS — R000 Tachycardia, unspecified: Secondary | ICD-10-CM | POA: Diagnosis present

## 2011-12-29 DIAGNOSIS — R002 Palpitations: Secondary | ICD-10-CM | POA: Diagnosis present

## 2011-12-29 DIAGNOSIS — E876 Hypokalemia: Secondary | ICD-10-CM | POA: Diagnosis present

## 2011-12-29 DIAGNOSIS — I161 Hypertensive emergency: Secondary | ICD-10-CM

## 2011-12-29 DIAGNOSIS — I501 Left ventricular failure: Secondary | ICD-10-CM | POA: Diagnosis present

## 2011-12-29 DIAGNOSIS — R079 Chest pain, unspecified: Secondary | ICD-10-CM

## 2011-12-29 DIAGNOSIS — IMO0001 Reserved for inherently not codable concepts without codable children: Secondary | ICD-10-CM | POA: Diagnosis present

## 2011-12-29 HISTORY — DX: Thyrotoxicosis, unspecified without thyrotoxic crisis or storm: E05.90

## 2011-12-29 LAB — GLUCOSE, CAPILLARY
Glucose-Capillary: 273 mg/dL — ABNORMAL HIGH (ref 70–99)
Glucose-Capillary: 228 mg/dL — ABNORMAL HIGH (ref 70–99)
Glucose-Capillary: 143 mg/dL — ABNORMAL HIGH (ref 70–99)
Glucose-Capillary: 167 mg/dL — ABNORMAL HIGH (ref 70–99)

## 2011-12-29 LAB — BASIC METABOLIC PANEL
BUN: 16 mg/dL (ref 6–23)
Calcium: 10.5 mg/dL (ref 8.4–10.5)
Creatinine, Ser: 0.9 mg/dL (ref 0.50–1.10)
GFR calc Af Amer: 84 mL/min — ABNORMAL LOW (ref >90)
GFR calc non Af Amer: 73 mL/min — ABNORMAL LOW (ref >90)
Glucose, Bld: 264 mg/dL — ABNORMAL HIGH (ref 70–99)
Potassium: 3.3 mEq/L — ABNORMAL LOW (ref 3.5–5.1)

## 2011-12-29 LAB — CREATININE, SERUM
GFR calc Af Amer: 77 mL/min — ABNORMAL LOW (ref >90)
GFR calc non Af Amer: 66 mL/min — ABNORMAL LOW (ref >90)

## 2011-12-29 LAB — TSH: TSH: 1.128 u[IU]/mL (ref 0.350–4.500)

## 2011-12-29 LAB — POCT I-STAT, CHEM 8
Chloride: 103 mEq/L (ref 96–112)
Glucose, Bld: 276 mg/dL — ABNORMAL HIGH (ref 70–99)
TCO2: 26 mmol/L (ref 0–100)

## 2011-12-29 LAB — PROTIME-INR: Prothrombin Time: 13.6 seconds (ref 11.6–15.2)

## 2011-12-29 LAB — MRSA PCR SCREENING: MRSA by PCR: NEGATIVE

## 2011-12-29 LAB — CBC
HCT: 39.9 % (ref 36.0–46.0)
Hemoglobin: 14.1 g/dL (ref 12.0–15.0)
MCH: 28.3 pg (ref 26.0–34.0)
MCHC: 34.1 g/dL (ref 30.0–36.0)
MCV: 83 fL (ref 78.0–100.0)
RBC: 4.81 MIL/uL (ref 3.87–5.11)
RDW: 13.9 % (ref 11.5–15.5)
WBC: 10.1 10*3/uL (ref 4.0–10.5)

## 2011-12-29 LAB — DIFFERENTIAL
Basophils Absolute: 0 10*3/uL (ref 0.0–0.1)
Basophils Relative: 0 % (ref 0–1)
Eosinophils Absolute: 0.1 10*3/uL (ref 0.0–0.7)
Monocytes Absolute: 0.7 10*3/uL (ref 0.1–1.0)
Monocytes Relative: 6 % (ref 3–12)
Neutro Abs: 7.8 10*3/uL — ABNORMAL HIGH (ref 1.7–7.7)
Neutrophils Relative %: 61 % (ref 43–77)

## 2011-12-29 LAB — POCT I-STAT TROPONIN I: Troponin i, poc: 0.03 ng/mL (ref 0.00–0.08)

## 2011-12-29 LAB — HEMOGLOBIN A1C: Mean Plasma Glucose: 200 mg/dL — ABNORMAL HIGH (ref <117)

## 2011-12-29 MED ORDER — PROPRANOLOL HCL 1 MG/ML IV SOLN
1.0000 mg | INTRAVENOUS | Status: DC
  Administered 2011-12-29: 1 mg via INTRAVENOUS
  Filled 2011-12-29 (×7): qty 1

## 2011-12-29 MED ORDER — ZOLPIDEM TARTRATE 5 MG PO TABS: 5.0000 mg | ORAL_TABLET | Freq: Every evening | ORAL | Status: DC | PRN

## 2011-12-29 MED ORDER — ACETAMINOPHEN 325 MG PO TABS: 650.0000 mg | ORAL_TABLET | Freq: Four times a day (QID) | ORAL | Status: DC | PRN

## 2011-12-29 MED ORDER — METOPROLOL TARTRATE 1 MG/ML IV SOLN
5.0000 mg | Freq: Four times a day (QID) | INTRAVENOUS | Status: DC
  Filled 2011-12-29: qty 5

## 2011-12-29 MED ORDER — SODIUM CHLORIDE 0.9 % IV SOLN
INTRAVENOUS | Status: DC
  Administered 2011-12-29: 10 mL via INTRAVENOUS

## 2011-12-29 MED ORDER — PROPYLTHIOURACIL 50 MG PO TABS
100.0000 mg | ORAL_TABLET | Freq: Three times a day (TID) | ORAL | Status: DC
  Administered 2011-12-29 (×2): 100 mg via ORAL
  Filled 2011-12-29 (×4): qty 2

## 2011-12-29 MED ORDER — ONDANSETRON HCL 4 MG/2ML IJ SOLN: 4.0000 mg | Freq: Four times a day (QID) | INTRAMUSCULAR | Status: DC | PRN

## 2011-12-29 MED ORDER — NITROGLYCERIN IN D5W 200-5 MCG/ML-% IV SOLN
5.0000 ug/min | Freq: Once | INTRAVENOUS | Status: AC
  Administered 2011-12-29: 10 ug/min via INTRAVENOUS
  Filled 2011-12-29: qty 250

## 2011-12-29 MED ORDER — NITROGLYCERIN IN D5W 200-5 MCG/ML-% IV SOLN: 2.0000 ug/min | INTRAVENOUS | Status: DC

## 2011-12-29 MED ORDER — INSULIN GLARGINE 100 UNIT/ML ~~LOC~~ SOLN
18.0000 [IU] | Freq: Every day | SUBCUTANEOUS | Status: DC
  Administered 2011-12-29 – 2011-12-30 (×2): 18 [IU] via SUBCUTANEOUS
  Filled 2011-12-29: qty 3

## 2011-12-29 MED ORDER — SODIUM CHLORIDE 0.9 % IV SOLN
Freq: Once | INTRAVENOUS | Status: AC
  Administered 2011-12-29: 04:00:00 via INTRAVENOUS

## 2011-12-29 MED ORDER — INFLUENZA VIRUS VACC SPLIT PF IM SUSP
0.5000 mL | INTRAMUSCULAR | Status: AC
  Filled 2011-12-29 (×2): qty 0.5

## 2011-12-29 MED ORDER — PROPYLTHIOURACIL 50 MG PO TABS
50.0000 mg | ORAL_TABLET | ORAL | Status: AC
  Administered 2011-12-29: 50 mg via ORAL
  Filled 2011-12-29: qty 1

## 2011-12-29 MED ORDER — METOPROLOL TARTRATE 1 MG/ML IV SOLN
5.0000 mg | Freq: Four times a day (QID) | INTRAVENOUS | Status: DC
  Administered 2011-12-29: 5 mg via INTRAVENOUS
  Filled 2011-12-29 (×5): qty 5

## 2011-12-29 MED ORDER — ALUM & MAG HYDROXIDE-SIMETH 200-200-20 MG/5ML PO SUSP: 30.0000 mL | Freq: Four times a day (QID) | ORAL | Status: DC | PRN

## 2011-12-29 MED ORDER — CLONIDINE HCL 0.2 MG PO TABS
0.2000 mg | ORAL_TABLET | Freq: Three times a day (TID) | ORAL | Status: DC
  Administered 2011-12-29 – 2011-12-31 (×7): 0.2 mg via ORAL
  Filled 2011-12-29 (×9): qty 1

## 2011-12-29 MED ORDER — ENOXAPARIN SODIUM 40 MG/0.4ML ~~LOC~~ SOLN
40.0000 mg | Freq: Every day | SUBCUTANEOUS | Status: DC
  Administered 2011-12-29 – 2011-12-31 (×3): 40 mg via SUBCUTANEOUS
  Filled 2011-12-29 (×3): qty 0.4

## 2011-12-29 MED ORDER — ACETAMINOPHEN 650 MG RE SUPP: 650.0000 mg | Freq: Four times a day (QID) | RECTAL | Status: DC | PRN

## 2011-12-29 MED ORDER — ASPIRIN EC 81 MG PO TBEC
81.0000 mg | DELAYED_RELEASE_TABLET | Freq: Every day | ORAL | Status: DC
  Administered 2011-12-29 – 2011-12-31 (×3): 81 mg via ORAL
  Filled 2011-12-29 (×3): qty 1

## 2011-12-29 MED ORDER — ONDANSETRON HCL 4 MG PO TABS: 4.0000 mg | ORAL_TABLET | Freq: Four times a day (QID) | ORAL | Status: DC | PRN

## 2011-12-29 MED ORDER — SODIUM CHLORIDE 0.9 % IJ SOLN
3.0000 mL | Freq: Two times a day (BID) | INTRAMUSCULAR | Status: DC
  Administered 2011-12-29 – 2011-12-30 (×3): 3 mL via INTRAVENOUS

## 2011-12-29 MED ORDER — AMLODIPINE BESYLATE 10 MG PO TABS
10.0000 mg | ORAL_TABLET | Freq: Every day | ORAL | Status: DC
  Administered 2011-12-29 – 2011-12-31 (×3): 10 mg via ORAL
  Filled 2011-12-29 (×3): qty 1

## 2011-12-29 MED ORDER — PNEUMOCOCCAL VAC POLYVALENT 25 MCG/0.5ML IJ INJ
0.5000 mL | INJECTION | INTRAMUSCULAR | Status: AC
  Filled 2011-12-29 (×2): qty 0.5

## 2011-12-29 MED ORDER — IODINE STRONG (LUGOLS) 5 % PO SOLN
0.2000 mL | ORAL | Status: AC
  Administered 2011-12-29: 05:00:00 via ORAL
  Filled 2011-12-29: qty 0.2

## 2011-12-29 MED ORDER — PROPYLTHIOURACIL 50 MG PO TABS
50.0000 mg | ORAL_TABLET | Freq: Three times a day (TID) | ORAL | Status: DC
  Filled 2011-12-29 (×2): qty 1

## 2011-12-29 MED ORDER — OXYCODONE HCL 5 MG PO TABS: 5.0000 mg | ORAL_TABLET | ORAL | Status: DC | PRN

## 2011-12-29 MED ORDER — PROPRANOLOL HCL 1 MG/ML IV SOLN
1.0000 mg | INTRAVENOUS | Status: AC
  Administered 2011-12-29: 1 mg via INTRAVENOUS
  Filled 2011-12-29: qty 1

## 2011-12-29 MED ORDER — INSULIN ASPART 100 UNIT/ML ~~LOC~~ SOLN
0.0000 [IU] | Freq: Three times a day (TID) | SUBCUTANEOUS | Status: DC
  Administered 2011-12-29: 7 [IU] via SUBCUTANEOUS
  Administered 2011-12-29: 11 [IU] via SUBCUTANEOUS
  Administered 2011-12-29: 3 [IU] via SUBCUTANEOUS
  Administered 2011-12-30 (×3): 4 [IU] via SUBCUTANEOUS
  Administered 2011-12-31: 3 [IU] via SUBCUTANEOUS
  Administered 2011-12-31: 4 [IU] via SUBCUTANEOUS
  Filled 2011-12-29: qty 3

## 2011-12-29 MED ORDER — HYDROCORTISONE SOD SUCCINATE 100 MG IJ SOLR
100.0000 mg | Freq: Once | INTRAMUSCULAR | Status: AC
  Administered 2011-12-29: 100 mg via INTRAVENOUS
  Filled 2011-12-29: qty 2

## 2011-12-29 MED ORDER — HYDRALAZINE HCL 20 MG/ML IJ SOLN
10.0000 mg | INTRAMUSCULAR | Status: DC | PRN
  Filled 2011-12-29: qty 0.5

## 2011-12-29 MED ORDER — INSULIN ASPART 100 UNIT/ML ~~LOC~~ SOLN: 0.0000 [IU] | Freq: Every day | SUBCUTANEOUS | Status: DC

## 2011-12-29 MED ORDER — HYDROMORPHONE HCL PF 1 MG/ML IJ SOLN: 0.5000 mg | INTRAMUSCULAR | Status: DC | PRN

## 2011-12-29 MED ORDER — POTASSIUM CHLORIDE CRYS ER 20 MEQ PO TBCR
40.0000 meq | EXTENDED_RELEASE_TABLET | ORAL | Status: AC
Start: 2011-12-29 — End: 2011-12-29
  Administered 2011-12-29 (×2): 40 meq via ORAL
  Filled 2011-12-29 (×2): qty 2

## 2011-12-29 MED ORDER — MORPHINE SULFATE 4 MG/ML IJ SOLN
2.0000 mg | Freq: Once | INTRAMUSCULAR | Status: AC
  Administered 2011-12-29: 2 mg via INTRAVENOUS
  Filled 2011-12-29: qty 1

## 2011-12-29 MED ORDER — FUROSEMIDE 10 MG/ML IJ SOLN
40.0000 mg | INTRAMUSCULAR | Status: AC
Start: 2011-12-29 — End: 2011-12-29
  Administered 2011-12-29: 40 mg via INTRAVENOUS
  Filled 2011-12-29: qty 4

## 2011-12-29 MED ORDER — IRBESARTAN 300 MG PO TABS
300.0000 mg | ORAL_TABLET | Freq: Every day | ORAL | Status: DC
  Administered 2011-12-29 – 2011-12-31 (×3): 300 mg via ORAL
  Filled 2011-12-29 (×3): qty 1

## 2011-12-29 NOTE — Progress Notes (Signed)
[facility] [place] TEAM 8  Subjective: Andrea [name] is an 51 y.o. female who presents to the ED with complaints of worsening SOB, and chest discomfort, a tightness in her chest and wheezing off and on. She reports having these symptoms for at least 1 month but the symptoms worsened overnight.  In the ED, she was found to have a blood pressure of 250/150 and was tachycardic.  At present she is resting comfortably, with c/o mild substernal and L sided chest pressure occurring at rest intermittently.    Objective: Weight change:   Intake/Output Summary (Last 24 hours) at 12/29/11 0853 Last data filed at 12/29/11 0800  Gross per 24 hour  Intake  50.15 ml  Output    300 ml  Net -249.85 ml   Blood pressure 159/96, pulse 83, temperature 97.5 F (36.4 C), temperature source Oral, resp. rate 27, height 5\' 5"  (1.651 m), weight 134.7 kg (296 lb 15.4 oz), SpO2 95.00%.  Physical Exam: F/u exam is completed  Lab Results:  [facility] 12/29/11 0340  NA 133*140  K 3.3*3.5  CL 96103  CO2 25  GLUCOSE 264*276*  BUN 1617  CREATININE 0.900.90  CALCIUM 10.5  MG --  PHOS --    Basename 12/29/11 0340  WBC 12.8*  NEUTROABS 7.8*  HGB 14.115.3*  HCT 41.445.0  MCV 83.0  PLT 317   Micro Results: Recent Results (from the past 240 hour(s))  MRSA PCR SCREENING     Status: Normal   Collection Time   12/29/11  6:38 AM      Component Value Range Status Comment   MRSA by PCR NEGATIVE  NEGATIVE  Final     Studies/Results: All recent x-ray/radiology reports have been reviewed in detail.   Medications: I have reviewed the patient's complete medication list.  Assessment/Plan:  HTN emergency BP has now been reduced to a reasonable goal, but the pt is still requiring a nitro gtt at - will titrate oral/IV meds and attempt to wean gtt to off  Pulmonary edema Likely due to extremely high afterload - not hypoxic - follow w/ tx of HTN  Tachycardia Possibly relate to  thyrotoxicosis - cont emperic tx - titrate BB as able - follow on tele - much improved at present  Hyperthyroidism/?thyrotoxicosis Pt had RAI tx at [facility] (Feb-2012) - TSH/FT4 are currently pending - will cont PTU and steroid until results are available, though I ? if her thyroid has been ablated fully and that her sx are simply malignant HTN  DM Uncontrolled w/ recent steroid dose - adjust tx and follow  Hypokalemia Being replaced  Hyponatremia Follow trend   Obesity  Elevated d-Dimer Low clinical suspicion for PE - check B le dopplers - if negative, no further w/u  Lonia Blood, MD [facility] Office  [phone] Pager [phone]  On-Call/Text Page:      [email or web address]      password [facility]

## 2011-12-29 NOTE — H&P (Signed)
DATE OF ADMISSION:  12/29/2011  PCP:  None   Chief Complaint: SOB  HPI: Andrea [name] is an 51 y.o. female who presents to the ED with complaints of worsening SOB, and chest discomfort, a tightness in her chest and wheezing off and on.  She reports having these symptoms for at least 1 month but the symptoms worsened overnight.      In the ED, she was found to have a blood pressure of 250/150 and was tachycardic.  Patient reports having heat intolerance and palpitations for years.  She reports having a diagnosis of Hyperthyroid and Goiter in 2011 and was treated with RAI X 1.    The EDP initiated treatment with IV Propranolol, IV Steroid, PTU, and an IV Nitroglycerin drip for the Hypertensive Urgency.  Her blood pressures improved, and she was referred to the Hospitalist Service for admission.     Past Medical History  Diagnosis Date  . Hypertension   . Diabetes mellitus   . High cholesterol   . Hyperthyroidism     History reviewed. No pertinent past surgical history.  Medications:  HOME MEDS: Prior to Admission medications   Medication Sig Start Date End Date Taking? Authorizing Provider  amLODipine (NORVASC) 10 MG tablet Take 1 tablet (10 mg total) by mouth daily. 09/24/11 09/23/12 Yes Dawn Vidal Schwalbe, PA  aspirin EC 81 MG tablet Take 81 mg by mouth daily.   Yes Historical Provider, MD  cloNIDine (CATAPRES) 0.2 MG tablet Take 0.5 tablets (0.1 mg total) by mouth 2 (two) times daily. 09/24/11 09/23/12 Yes Dawn Vidal Schwalbe, PA  fexofenadine (ALLEGRA) 180 MG tablet Take 180 mg by mouth daily as needed. For seasonal allergies   Yes Historical Provider, MD  metFORMIN (GLUCOPHAGE) 1000 MG tablet Take 1,000 mg by mouth 2 (two) times daily with a meal.     Yes Historical Provider, MD  metoprolol (LOPRESSOR) 100 MG tablet Take 50 mg by mouth 2 (two) times daily.   Yes Historical Provider, MD  sertraline (ZOLOFT) 50 MG tablet Take 50 mg by mouth daily.   Yes Historical Provider, MD    triamcinolone (NASACORT AQ) 55 MCG/ACT nasal inhaler Place 1-2 sprays into the nose daily.   Yes Historical Provider, MD  valsartan (DIOVAN) 320 MG tablet Take 320 mg by mouth daily.    Yes Historical Provider, MD    Allergies:  No Known Allergies  Social History:   reports that she has never smoked. She does not have any smokeless tobacco history on file. She reports that she does not drink alcohol or use illicit drugs.  Family History: History reviewed. No pertinent family history.  Review of Systems:  The patient denies anorexia, fever, weight loss, vision loss, decreased hearing, hoarseness, syncope, dyspnea on exertion, peripheral edema, balance deficits, hemoptysis, abdominal pain, melena, hematochezia, severe indigestion/heartburn, hematuria, incontinence, genital sores, muscle weakness, suspicious skin lesions, transient blindness, difficulty walking, depression, unusual weight change, abnormal bleeding, enlarged lymph nodes, angioedema, and breast masses.   Physical Exam:  GEN:  Pleasant  Morbidly Obese African American female examined  and in no acute distress currently;  cooperative with exam Filed Vitals:   12/29/11 0308 12/29/11 0330 12/29/11 0430 12/29/11 0515  BP: 257/150 230/145 188/110 156/96  Pulse: 114 114 105 86  Temp: 97.5 F (36.4 C)     TempSrc: Oral     Resp: 18 31 25 28   SpO2: 92% 96% 97% 98%   Blood pressure 156/96, pulse 86, temperature 97.5 F (36.4 C),  temperature source Oral, resp. rate 28, SpO2 98.00%. PSYCH: She is alert and oriented x4; does not appear anxious does not appear depressed; affect is normal HEENT: Normocephalic and Atraumatic, Mucous membranes pink; PERRLA; EOM intact; Fundi:  Benign;  No scleral icterus, Nares: Patent, Oropharynx: Clear, Edentulous or Fair Dentition, Neck:  FROM, no cervical lymphadenopathy nor thyromegaly or carotid bruit; no JVD; Breasts:: Not examined CHEST WALL: No tenderness CHEST: Normal respiration, clear to  auscultation bilaterally HEART:  Mild tachycardia,  Regular rate and rhythm; no murmurs rubs or gallops BACK: No kyphosis or scoliosis; no CVA tenderness ABDOMEN: Positive Bowel Sounds,  Obese, soft non-tender; no masses, no organomegaly, no pannus; no intertriginous candida. Rectal Exam: Not done EXTREMITIES: No cyanosis, clubbing or edema; no ulcerations. Genitalia: not examined PULSES: 2+ and symmetric SKIN: Normal hydration no rash or ulceration CNS: Cranial nerves 2-12 grossly intact no focal neurologic deficit   Labs & Imaging Results for orders placed during the hospital encounter of 12/29/11 (from the past 48 hour(s))  POCT I-STAT TROPONIN I     Status: Normal   Collection Time   12/29/11  3:38 AM      Component Value Range Comment   Troponin i, poc 0.03  0.00 - 0.08 (ng/mL)    Comment 3            BASIC METABOLIC PANEL     Status: Abnormal   Collection Time   12/29/11  3:40 AM      Component Value Range Comment   Sodium 133 (*) 135 - 145 (mEq/L) DELTA CHECK NOTED   Potassium 3.3 (*) 3.5 - 5.1 (mEq/L)    Chloride 96  96 - 112 (mEq/L)    CO2 25  19 - 32 (mEq/L)    Glucose, Bld 264 (*) 70 - 99 (mg/dL)    BUN 16  6 - 23 (mg/dL)    Creatinine, Ser 4.09  0.50 - 1.10 (mg/dL)    Calcium 81.1  8.4 - 10.5 (mg/dL)    GFR calc non Af Amer 73 (*) >90 (mL/min)    GFR calc Af Amer 84 (*) >90 (mL/min)   CBC     Status: Abnormal   Collection Time   12/29/11  3:40 AM      Component Value Range Comment   WBC 12.8 (*) 4.0 - 10.5 (K/uL)    RBC 4.99  3.87 - 5.11 (MIL/uL)    Hemoglobin 14.1  12.0 - 15.0 (g/dL)    HCT 91.4  78.2 - 95.6 (%)    MCV 83.0  78.0 - 100.0 (fL)    MCH 28.3  26.0 - 34.0 (pg)    MCHC 34.1  30.0 - 36.0 (g/dL)    RDW 21.3  08.6 - 57.8 (%)    Platelets 317  150 - 400 (K/uL)   DIFFERENTIAL     Status: Abnormal   Collection Time   12/29/11  3:40 AM      Component Value Range Comment   Neutrophils Relative 61  43 - 77 (%)    Neutro Abs 7.8 (*) 1.7 - 7.7 (K/uL)     Lymphocytes Relative 33  12 - 46 (%)    Lymphs Abs 4.2 (*) 0.7 - 4.0 (K/uL)    Monocytes Relative 6  3 - 12 (%)    Monocytes Absolute 0.7  0.1 - 1.0 (K/uL)    Eosinophils Relative 1  0 - 5 (%)    Eosinophils Absolute 0.1  0.0 - 0.7 (K/uL)  Basophils Relative 0  0 - 1 (%)    Basophils Absolute 0.0  0.0 - 0.1 (K/uL)   APTT     Status: Normal   Collection Time   12/29/11  3:40 AM      Component Value Range Comment   aPTT 27  24 - 37 (seconds)   PROTIME-INR     Status: Normal   Collection Time   12/29/11  3:40 AM      Component Value Range Comment   Prothrombin Time 13.6  11.6 - 15.2 (seconds)    INR 1.02  0.00 - 1.49    D-DIMER, QUANTITATIVE     Status: Abnormal   Collection Time   12/29/11  3:40 AM      Component Value Range Comment   D-Dimer, Quant 1.41 (*) 0.00 - 0.48 (ug/mL-FEU)   PRO B NATRIURETIC PEPTIDE     Status: Abnormal   Collection Time   12/29/11  3:40 AM      Component Value Range Comment   Pro B Natriuretic peptide (BNP) 181.6 (*) 0 - 125 (pg/mL)   POCT I-STAT, CHEM 8     Status: Abnormal   Collection Time   12/29/11  3:40 AM      Component Value Range Comment   Sodium 140  135 - 145 (mEq/L)    Potassium 3.5  3.5 - 5.1 (mEq/L)    Chloride 103  96 - 112 (mEq/L)    BUN 17  6 - 23 (mg/dL)    Creatinine, Ser 1.61  0.50 - 1.10 (mg/dL)    Glucose, Bld 096 (*) 70 - 99 (mg/dL)    Calcium, Ion 0.45  1.12 - 1.32 (mmol/L)    TCO2 26  0 - 100 (mmol/L)    Hemoglobin 15.3 (*) 12.0 - 15.0 (g/dL)    HCT 40.9  81.1 - 91.4 (%)    Dg Chest Port 1 View  12/29/2011  *RADIOLOGY REPORT*  Clinical Data: Shortness of breath, chest pain.  PORTABLE CHEST - 1 VIEW  Comparison: None  Findings: There is cardiomegaly with vascular congestion and diffuse interstitial prominence.  Bilateral airspace opacities. Findings most compatible with CHF.  No definite effusions.  No acute bony abnormality.  IMPRESSION: Mild to moderate CHF.  Original Report Authenticated By: Cyndie Chime, M.D.   EKG:   Sinus Tachycardia rate 113, No acute ST segment changes   Assessment/Plan: Present on Admission:  .Hypertensive emergency .Pulmonary edema with congestive heart failure .Tachycardia .HYPERTHYROIDISM .DIABETES MELLITUS, TYPE II .Palpitations .Morbid obesity  Plan:    Admitted to Stepdown Bed Team 8 Her Symptoms and Conditions are  consistent with Thyrotoxicosis IV NTG drip for Hypertensive Crisis IV Propranol q 4 hours for tachycardic symptoms PTU Rx to continue Thyroid Function Panel Pending sent by EDP Regular Medications reconciled SSI coverage PRN DVT prophylaxis Other plans as per orders.    CODE STATUS:      FULL CODE       Critical care time: 60 minutes.   Laiklynn Raczynski C 12/29/2011, 6:33 AM

## 2011-12-29 NOTE — ED Notes (Signed)
Patient states she has been having chest tightness on and off for one week.  The shortness of breath has been increasing the last few days.

## 2011-12-29 NOTE — ED Provider Notes (Signed)
History     CSN: [id number]  Arrival date & time 12/29/11  0303   First MD Initiated Contact with Patient 12/29/11 0315      Chief Complaint  Patient presents with  . Shortness of Breath  . Chest Pain    (Consider location/radiation/quality/duration/timing/severity/associated sxs/prior treatment) HPI Comments: 51 year old female with a history of diabetes, hyperthyroidism, hypertension, who presents with a complaint of chest pain and shortness of breath. She was diagnosed with sinusitis several months ago, but feels that she never fully gopher symptoms and has had a persistent mild cough. Over the last week she has developed a substernal heaviness that is intermittent, comes and goes, is not related to exertion. She does have associated shortness of breath. Her symptoms are severe this evening, persistent, not associated with fevers chills nausea vomiting swelling. She denies any radiation of the chest pain, denies any swelling of the legs, travel, trauma, recent surgery, hormone use, history of cancer or prior DVT.  Primary care Dr.:  [facility]  Patient denies having cardiologist. She states that she does not take her anti-hypertensive medication regularly. She states that her blood sugars have been in the 150 range  The history is provided by the patient and a friend.    Past Medical History  Diagnosis Date  . Hypertension   . Diabetes mellitus   . High cholesterol     History reviewed. No pertinent past surgical history.  History reviewed. No pertinent family history.  History  Substance Use Topics  . Smoking status: Never Smoker   . Smokeless tobacco: Not on file  . Alcohol Use: No    OB History    Grav Para Term Preterm Abortions TAB SAB Ect Mult Living                  Review of Systems  All other systems reviewed and are negative.    Allergies  Review of patient's allergies indicates no known allergies.  Home Medications   Current Outpatient Rx    Name Route Sig Dispense Refill  . AMLODIPINE BESYLATE 10 MG PO TABS Oral Take 1 tablet (10 mg total) by mouth daily. 30 tablet 0  . ASPIRIN EC 81 MG PO TBEC Oral Take 81 mg by mouth daily.    Marland Kitchen CLONIDINE HCL 0.2 MG PO TABS Oral Take 0.5 tablets (0.1 mg total) by mouth 2 (two) times daily. 60 tablet 0  . FEXOFENADINE HCL 180 MG PO TABS Oral Take 180 mg by mouth daily as needed. For seasonal allergies    . METFORMIN HCL 1000 MG PO TABS Oral Take 1,000 mg by mouth 2 (two) times daily with a meal.      . METOPROLOL TARTRATE 100 MG PO TABS Oral Take 50 mg by mouth 2 (two) times daily.    . SERTRALINE HCL 50 MG PO TABS Oral Take 50 mg by mouth daily.    . TRIAMCINOLONE ACETONIDE 55 MCG/ACT NA INHA Nasal Place 1-2 sprays into the nose daily.    Marland Kitchen VALSARTAN 320 MG PO TABS Oral Take 320 mg by mouth daily.       BP 257/150  Pulse 114  Temp(Src) 97.5 F (36.4 C) (Oral)  Resp 18  SpO2 92%  Physical Exam  Nursing note and vitals reviewed. Constitutional: She appears well-developed and well-nourished. She appears distressed.  HENT:  Head: Normocephalic and atraumatic.  Mouth/Throat: Oropharynx is clear and moist. No oropharyngeal exudate.  Eyes: Conjunctivae and EOM are normal. Pupils are equal,  round, and reactive to light. Right eye exhibits no discharge. Left eye exhibits no discharge. No scleral icterus.  Neck: Normal range of motion. Neck supple. No JVD present. No thyromegaly present.  Cardiovascular: Regular rhythm, normal heart sounds and intact distal pulses.  Exam reveals no gallop and no friction rub.   No murmur heard.      Tachycardia  Pulmonary/Chest: She is in respiratory distress. She has wheezes. She has rales.       Increased respiratory rate of 26 breaths per minute, increased work of breathing, mild accessory muscle use, expiratory wheezes present, inspiratory rales present  Abdominal: Soft. Bowel sounds are normal. She exhibits no distension and no mass. There is no  tenderness.       Obese  Musculoskeletal: Normal range of motion. She exhibits no edema and no tenderness.  Lymphadenopathy:    She has no cervical adenopathy.  Neurological: She is alert. Coordination normal.  Skin: Skin is warm. No rash noted. She is diaphoretic. No erythema.  Psychiatric: She has a normal mood and affect. Her behavior is normal.    ED Course  Procedures (including critical care time)  Labs Reviewed - No data to display No results found.   No diagnosis found.    MDM  Patient appears very uncomfortable with respiratory distress, hypoxia to 92% though she does have a normal mental status. Her blood pressure is 257/150 on arrival. This severe hypertension in addition to her poor compliance with medications and history of hyperthyroidism is likely multifactorial and causing respiratory decompensation from either congestive heart failure, pulmonary embolism or other source. We'll proceed with chest x-ray, laboratory evaluation, nitroglycerin drip for severe hypertension.  Initial EKG does not show any signs of ischemia other than mild tachycardia   ED ECG REPORT   Date: 12/29/2011   Rate: 113  Rhythm: sinus tachycardia  QRS Axis: left  Intervals: normal  ST/T Wave abnormalities: nonspecific ST/T changes  Conduction Disutrbances:none  Narrative Interpretation:   Old EKG Reviewed: none available  ED Course:  Patient was placed on nitroglycerin drip with improvement of the blood pressure to 180 systolic. Oxygen levels improved with nasal cannula supportive oxygen. Pulse rate decreased to the low 100s, Lasix given for pulmonary edema. Suspected that patient likely had high output cardiac failure related to severe hypertension and possible hyperthyroidism or thyroid storm.  Nitroglycerin titrated for improved blood pressure   Labarotory results reviewed:  BNP 181, a sick metabolic panel with mild hypokalemia and hyperglycemia, white blood cell count 12.8, troponin  normal,     Radiology imaging reviewed:  I have personally reviewed the imaging - moderate CHF per my read, agree with radiologist   Previous Records reviewed: According to the medical records, the patient received radioactive iodine therapy [facility] in February of 2012, after that time she had been on some of them is all but no longer takes that medication.     Medications given in ED:   #1 propanolol IV #2 Solu-Cortef IV #3 lugol's solution by mouth #4 propylthiouracil #5 Lasix #6 nitroglycerin intravenous drip titrated to pain relief and blood pressure control  The pt and (or) family members were informed of results and need for close follow up  Consultation: [facility] hospitalist Dr. Lovell Sheehan  CRITICAL CARE Performed by: Vida Roller   Total critical care time: 35  Critical care time was exclusive of separately billable procedures and treating other patients.  Critical care was necessary to treat or prevent imminent or  life-threatening deterioration.  Critical care was time spent personally by me on the following activities: development of treatment plan with patient and/or surrogate as well as nursing, discussions with consultants, evaluation of patient's response to treatment, examination of patient, obtaining history from patient or surrogate, ordering and performing treatments and interventions, ordering and review of laboratory studies, ordering and review of radiographic studies, pulse oximetry and re-evaluation of patient's condition.   Disposition:  Admit to step down          Vida Roller, MD 12/29/11 [phone]

## 2011-12-29 NOTE — ED Notes (Signed)
C/o sob & chest tightness, sent to EKG room for EKG.

## 2011-12-29 NOTE — Progress Notes (Signed)
VASCULAR LAB PRELIMINARY  PRELIMINARY  PRELIMINARY  PRELIMINARY  Bilateral lower extremity venous duplex  completed.    Preliminary report:  Bilateral:  No evidence of DVT, superficial thrombosis, or Baker's Cyst.    Terance Hart, RVT 12/29/2011, 1:47 PM

## 2011-12-29 NOTE — ED Notes (Signed)
ecg shown to edp [employer]

## 2011-12-30 LAB — BASIC METABOLIC PANEL
BUN: 19 mg/dL (ref 6–23)
Creatinine, Ser: 1.02 mg/dL (ref 0.50–1.10)
GFR calc Af Amer: 73 mL/min — ABNORMAL LOW (ref >90)
GFR calc non Af Amer: 63 mL/min — ABNORMAL LOW (ref >90)
Potassium: 4 mEq/L (ref 3.5–5.1)

## 2011-12-30 LAB — GLUCOSE, CAPILLARY: Glucose-Capillary: 172 mg/dL — ABNORMAL HIGH (ref 70–99)

## 2011-12-30 LAB — CBC
Hemoglobin: 11.6 g/dL — ABNORMAL LOW (ref 12.0–15.0)
MCHC: 32.1 g/dL (ref 30.0–36.0)
RDW: 14.2 % (ref 11.5–15.5)

## 2011-12-30 MED ORDER — METOPROLOL TARTRATE 50 MG PO TABS
50.0000 mg | ORAL_TABLET | Freq: Two times a day (BID) | ORAL | Status: DC
  Filled 2011-12-30 (×2): qty 1

## 2011-12-30 MED ORDER — METFORMIN HCL 500 MG PO TABS
1000.0000 mg | ORAL_TABLET | Freq: Two times a day (BID) | ORAL | Status: DC
  Administered 2011-12-30 – 2011-12-31 (×2): 1000 mg via ORAL
  Filled 2011-12-30 (×4): qty 2

## 2011-12-30 MED ORDER — METOPROLOL TARTRATE 50 MG PO TABS
50.0000 mg | ORAL_TABLET | Freq: Two times a day (BID) | ORAL | Status: DC
  Administered 2011-12-30 – 2011-12-31 (×2): 50 mg via ORAL
  Filled 2011-12-30 (×3): qty 1

## 2011-12-30 NOTE — Progress Notes (Signed)
[facility] [place] TEAM 8  Subjective: Andrea [name] is an 51 y.o. female who presents to the ED with complaints of worsening SOB, and chest discomfort, a tightness in her chest and wheezing off and on. She reports having these symptoms for at least 1 month but the symptoms worsened overnight.  In the ED, she was found to have a blood pressure of 250/150 and was tachycardic.  Today the patient is resting comfortably in the bedside chair.  She denies fevers chills nausea vomiting shortness of breath or chest pain.  Objective: Weight change:   Intake/Output Summary (Last 24 hours) at 12/30/11 1600 Last data filed at 12/30/11 0800  Gross per 24 hour  Intake    410 ml  Output    300 ml  Net    110 ml   Blood pressure 149/80, pulse 73, temperature 97.6 F (36.4 C), temperature source Oral, resp. rate 18, height 5\' 5"  (1.651 m), weight 134.7 kg (296 lb 15.4 oz), SpO2 92.00%.  Physical Exam: General: No acute respiratory distress Lungs: Clear to auscultation bilaterally without wheezes or crackles Cardiovascular: Regular rate and rhythm without murmur gallop or rub normal S1 and S2 Abdomen: Nontender, nondistended, soft, bowel sounds positive, no rebound, no ascites, no appreciable mass - overweight Extremities: No significant cyanosis, clubbing or edema bilateral lower extremities   Lab Results:  Basename 12/30/11 0620 12/29/11 0821 12/29/11 0340  NA 139 -- 133*140  K 4.0 -- 3.3*3.5  CL 103 -- 96103  CO2 25 -- 25  GLUCOSE 175* -- 264*276*  BUN 19 -- 1617  CREATININE 1.02 0.97 0.900.90  CALCIUM 10.1 -- 10.5  MG -- -- --  PHOS -- -- --    Basename 12/30/11 0620 12/29/11 0821 12/29/11 0340  WBC 8.4 10.1 12.8*  NEUTROABS -- -- 7.8*  HGB 11.6* 13.5 14.115.3*  HCT 36.1 39.9 41.445.0  MCV 84.1 83.0 83.0  PLT 263 300 317   Micro Results: Recent Results (from the past 240 hour(s))  MRSA PCR SCREENING     Status: Normal   Collection Time   12/29/11  6:38 AM       Component Value Range Status Comment   MRSA by PCR NEGATIVE  NEGATIVE  Final     Studies/Results: All recent x-ray/radiology reports have been reviewed in detail.   Medications: I have reviewed the patient's complete medication list.  Assessment/Plan:  HTN emergency BP is now much better controlled-the patient's current oral regimen is doing a very good job-we will monitor 24 hours additional with plan to discharge home on her current regimen  Pulmonary edema Likely due to extremely high afterload - not hypoxic - clinically resolved with treatment of her malignant hypertension  Tachycardia Likely related to malignant hypertension and pulmonary edema-resolved  Hyperthyroidism/?thyrotoxicosis Pt had RAI tx at [facility] (Feb-2012) - TSH/FT4 are not consistent with thyrotoxicosis - no further evaluation needed  DM Uncontrolled w/ recent steroid dose - resuming her oral medications and will also continue insulin  Hypokalemia Corrected  Hyponatremia Resolved  Obesity  Elevated d-Dimer Low clinical suspicion for PE - B le dopplers negative - no indication for further evaluation  Disposition Transfer to telemetry-monitor 24 hours additionally-probable discharge home tomorrow-will need social work to assist in connecting to a local physician  Lonia Blood, MD [facility] Office  [phone] Pager [phone]  On-Call/Text Page:      [email or web address]      password [facility]

## 2011-12-31 LAB — CBC
MCH: 27.4 pg (ref 26.0–34.0)
MCV: 83.8 fL (ref 78.0–100.0)
Platelets: 287 10*3/uL (ref 150–400)
RDW: 14 % (ref 11.5–15.5)

## 2011-12-31 LAB — GLUCOSE, CAPILLARY

## 2011-12-31 MED ORDER — METFORMIN HCL 1000 MG PO TABS: 1000.0000 mg | ORAL_TABLET | Freq: Two times a day (BID) | ORAL | Status: DC

## 2011-12-31 MED ORDER — CLONIDINE HCL 0.2 MG PO TABS: 0.2000 mg | ORAL_TABLET | Freq: Three times a day (TID) | ORAL | Status: DC

## 2011-12-31 MED ORDER — METOPROLOL TARTRATE 100 MG PO TABS: 50.0000 mg | ORAL_TABLET | Freq: Two times a day (BID) | ORAL | Status: DC

## 2011-12-31 MED ORDER — GLIPIZIDE 10 MG PO TABS: 5.0000 mg | ORAL_TABLET | Freq: Two times a day (BID) | ORAL | Status: DC

## 2011-12-31 MED ORDER — AMLODIPINE BESYLATE 10 MG PO TABS: 10.0000 mg | ORAL_TABLET | Freq: Every day | ORAL | Status: DC

## 2011-12-31 MED ORDER — IRBESARTAN 300 MG PO TABS: 300.0000 mg | ORAL_TABLET | Freq: Every day | ORAL | Status: DC

## 2011-12-31 NOTE — Progress Notes (Signed)
   CARE MANAGEMENT NOTE 12/31/2011  Patient:  Andrea [name], Andrea [name]   Account Number:  [id number]  Date Initiated:  12/31/2011  Documentation initiated by:  Onnie Boer  Subjective/Objective Assessment:   PT WAS ADMITTED WITH [facility]RGENC[facility]     Action/Plan:   PROGRESSION OF CARE AND DISCHARGE PLANNING   Anticipated DC Date:  12/31/2011   Anticipated DC Plan:  HOME/SELF CARE      DC Planning Services  CM consult      Choice offered to / List presented to:             Status of service:  In process, will continue to follow Medicare Important Message given?   (If response is "NO", the following Medicare IM given date fields will be blank) Date Medicare IM given:   Date Additional Medicare IM given:    Discharge Disposition:    Per UR Regulation:  Reviewed for med. necessity/level of care/duration of stay  Comments:  UR COMPLETED.  Zakaree Mcclenahan, RN,BSN 12/31/11 1559 PT WAS ADMITTED WITH [facility].  PT NOW HAS MEDICAID AND WILL BE DC'D TO HOME TODAY.  PT STATES THAT SHE CAN HANDLE GETTING ALL OF HER MEDS AND THAT SHE WAS SEEN AT [facility] UNTIL SHE GOT MEDICAID.  I MADE A F/U APPT WITH [facility] FOR APRIL 8TH AT 215 PM WITH DR. VOLLMER.

## 2011-12-31 NOTE — Progress Notes (Signed)
Inpatient Diabetes Program Recommendations  AACE/ADA: New Consensus Statement on Inpatient Glycemic Control (2009)  Target Ranges:  Prepandial:   less than 140 mg/dL      Peak postprandial:   less than 180 mg/dL (1-2 hours)      Critically ill patients:  140 - 180 mg/dL   Reason for Visit: Elevated HgBA1C  Inpatient Diabetes Program Recommendations Insulin - Basal: HgBA1C at 8.6%.  Pt would benefit from Lantus at home.  If to be discharged on insulin, please order insulin starter kit and education per bedside RN  Diet: Please change to carbohydrate modified diet as it includes heart healthy; however, the heart healthy diet does not include provisions for carbohydrate modified.  Note: Thank you, Lenor Coffin, RN, CNS, Diabetes Coordinator [phone])

## 2011-12-31 NOTE — Discharge Summary (Signed)
DISCHARGE SUMMARY  [name]  MR#: [id number]  DOB:[birth year]  Date of Admission: 12/29/2011 Date of Discharge: 12/31/2011  Attending Physician:Kehaulani Fruin T  Patient's PCP:No Pcp Per Pt  Consults: None necessary  Discharge Diagnoses: Present on Admission:  .Hypertensive emergency .Pulmonary edema with congestive heart failure .Tachycardia .hx of HYPERTHYROIDISM s/p ablation .DIABETES MELLITUS, TYPE II .Palpitations .Morbid obesity .High output heart failure  Medication List  As of 12/31/2011  1:25 PM   STOP taking these medications         valsartan 320 MG tablet         TAKE these medications         amLODipine 10 MG tablet   Commonly known as: NORVASC   Take 1 tablet (10 mg total) by mouth daily.      aspirin EC 81 MG tablet   Take 81 mg by mouth daily.      cloNIDine 0.2 MG tablet   Commonly known as: CATAPRES   Take 1 tablet (0.2 mg total) by mouth 3 (three) times daily.      fexofenadine 180 MG tablet   Commonly known as: ALLEGRA   Take 180 mg by mouth daily as needed. For seasonal allergies      glipiZIDE 10 MG tablet   Commonly known as: GLUCOTROL   Take 0.5 tablets (5 mg total) by mouth 2 (two) times daily before a meal.      irbesartan 300 MG tablet   Commonly known as: AVAPRO   Take 1 tablet (300 mg total) by mouth daily.      metFORMIN 1000 MG tablet   Commonly known as: GLUCOPHAGE   Take 1 tablet (1,000 mg total) by mouth 2 (two) times daily with a meal.      metoprolol 100 MG tablet   Commonly known as: LOPRESSOR   Take 0.5 tablets (50 mg total) by mouth 2 (two) times daily.      NASACORT AQ 55 MCG/ACT nasal inhaler   Generic drug: triamcinolone   Place 1-2 sprays into the nose daily.      sertraline 50 MG tablet   Commonly known as: ZOLOFT   Take 50 mg by mouth daily.           Hospital Course: Present on Admission:  CAROLINE [name] is an 51 y.o. female who presents to the ED with complaints of worsening SOB, and  chest discomfort, a tightness in her chest and wheezing off and on. She reports having these symptoms for at least 1 month but the symptoms worsened overnight. In the ED, she was found to have a blood pressure of 250/150 and was tachycardic.  HTN emergency  BP is now much better controlled-the patient's current oral regimen is doing a very good job-it is suspected the patient's hypertensive emergency was mediated primarily by medication noncompliance-the patient was educated extensively as to the absolute importance of compliance with her medication regimen as well as importance of routine outpatient followup to assure that her blood pressure remains well controlled  Pulmonary edema  Likely due to extremely high afterload - not hypoxic - clinically resolved with treatment of her malignant hypertension   Tachycardia  Likely related to malignant hypertension and pulmonary edema-resolved   Hyperthyroidism/?thyrotoxicosis  Pt had RAI tx at [facility] (Feb-2012) - TSH/FT4 are not consistent with thyrotoxicosis - no further evaluation needed   DM  The patient's diabetes proved difficult to control initially due to a high dose of steroids she was given in  the emergency room-prior to her admission she was on Glucophage only-the patient was educated on the fact that she would likely require insulin-she was very resistant to initiating insulin therapy-at her request for now we have decided to add a second oral agent-I have advised the patient that she will need to follow her CBGs very closely and at persistent elevation in the 180-200 range will need to severe medical consequences-I. have advised her that close followup with her outpatient physician for further regulation will be required- I have also advised her to follow a strict diabetic diet  Hypokalemia  Corrected   Hyponatremia  Resolved   Obesity  The patient has been educated on the health benefits of weight loss and advised by her physician  to accomplish significant weight loss  Elevated d-Dimer  Low clinical suspicion for PE - B le dopplers negative - no indication for further evaluation   Day of Discharge BP 151/109  Pulse 77  Temp(Src) 97.6 F (36.4 C) (Oral)  Resp 19  Ht 5\' 5"  (1.651 m)  Wt 134.7 kg (296 lb 15.4 oz)  BMI 49.42 kg/m2  SpO2 97%  Physical Exam: General: No acute respiratory distress Lungs: Clear to auscultation bilaterally without wheezes or crackles Cardiovascular: Regular rate and rhythm without murmur gallop or rub normal S1 and S2 Abdomen: Nontender, nondistended, soft, bowel sounds positive, no rebound, no ascites, no appreciable mass-obese Extremities: No significant cyanosis, clubbing, or edema bilateral lower extremities   Results for orders placed during the hospital encounter of 12/29/11 (from the past 24 hour(s))  GLUCOSE, CAPILLARY     Status: Abnormal   Collection Time   12/30/11  4:07 PM      Component Value Range   Glucose-Capillary 200 (*) 70 - 99 (mg/dL)  GLUCOSE, CAPILLARY     Status: Abnormal   Collection Time   12/30/11  9:17 PM      Component Value Range   Glucose-Capillary 194 (*) 70 - 99 (mg/dL)  GLUCOSE, CAPILLARY     Status: Abnormal   Collection Time   12/31/11  6:13 AM      Component Value Range   Glucose-Capillary 185 (*) 70 - 99 (mg/dL)  CBC     Status: Normal   Collection Time   12/31/11  6:20 AM      Component Value Range   WBC 8.7  4.0 - 10.5 (K/uL)   RBC 4.38  3.87 - 5.11 (MIL/uL)   Hemoglobin 12.0  12.0 - 15.0 (g/dL)   HCT 10.9  60.4 - 54.0 (%)   MCV 83.8  78.0 - 100.0 (fL)   MCH 27.4  26.0 - 34.0 (pg)   MCHC 32.7  30.0 - 36.0 (g/dL)   RDW 98.1  19.1 - 47.8 (%)   Platelets 287  150 - 400 (K/uL)  GLUCOSE, CAPILLARY     Status: Abnormal   Collection Time   12/31/11 11:28 AM      Component Value Range   Glucose-Capillary 121 (*) 70 - 99 (mg/dL)   Comment 1 Notify RN     Comment 2 Documented in Chart     Disposition: Discharge home  Follow-up  Appts: Discharge Orders    Future Orders Please Complete By Expires   Diet - low sodium heart healthy      Diet Carb Modified      Increase activity slowly         Follow-up Information    Follow up with as directed by Case Manager. (schedule  an appointment as soon as possible - within 7-10 days if able )         Tests Needing Follow-up: The patient will need close followup of her blood pressure and CBGs  Time spent in discharge (includes decision making & examination of pt): >30 minutes  Signed: Bobie Kistler T 12/31/2011, 1:25 PM

## 2011-12-31 NOTE — Discharge Instructions (Signed)
Arterial Hypertension Arterial hypertension (high blood pressure) is a condition of elevated pressure in your blood vessels. Hypertension over a long period of time is a risk factor for strokes, heart attacks, and heart failure. It is also the leading cause of kidney (renal) failure.  CAUSES   In Adults -- Over 90% of all hypertension has no known cause. This is called essential or primary hypertension. In the other 10% of people with hypertension, the increase in blood pressure is caused by another disorder. This is called secondary hypertension. Important causes of secondary hypertension are:   Heavy alcohol use.   Obstructive sleep apnea.   Hyperaldosterosim (Conn's syndrome).   Steroid use.   Chronic kidney failure.   Hyperparathyroidism.   Medications.   Renal artery stenosis.   Pheochromocytoma.   Cushing's disease.   Coarctation of the aorta.   Scleroderma renal crisis.   Licorice (in excessive amounts).   Drugs (cocaine, methamphetamine).  Your caregiver can explain any items above that apply to you.  In Children -- Secondary hypertension is more common and should always be considered.   Pregnancy -- Few women of childbearing age have high blood pressure. However, up to 10% of them develop hypertension of pregnancy. Generally, this will not harm the woman. It Even be a sign of 3 complications of pregnancy: preeclampsia, HELLP syndrome, and eclampsia. Follow up and control with medication is necessary.  SYMPTOMS   This condition normally does not produce any noticeable symptoms. It is usually found during a routine exam.   Malignant hypertension is a late problem of high blood pressure. It Monger have the following symptoms:   Headaches.   Blurred vision.   End-organ damage (this means your kidneys, heart, lungs, and other organs are being damaged).   Stressful situations can increase the blood pressure. If a person with normal blood pressure has their blood  pressure go up while being seen by their caregiver, this is often termed "white coat hypertension." Its importance is not known. It Alkire be related with eventually developing hypertension or complications of hypertension.   Hypertension is often confused with mental tension, stress, and anxiety.  DIAGNOSIS  The diagnosis is made by 3 separate blood pressure measurements. They are taken at least 1 week apart from each other. If there is organ damage from hypertension, the diagnosis Lemire be made without repeat measurements. Hypertension is usually identified by having blood pressure readings:  Above 140/90 mmHg measured in both arms, at 3 separate times, over a couple weeks.   Over 130/80 mmHg should be considered a risk factor and Grisanti require treatment in patients with diabetes.  Blood pressure readings over 120/80 mmHg are called "pre-hypertension" even in non-diabetic patients. To get a true blood pressure measurement, use the following guidelines. Be aware of the factors that can alter blood pressure readings.  Take measurements at least 1 hour after caffeine.   Take measurements 30 minutes after smoking and without any stress. This is another reason to quit smoking - it raises your blood pressure.   Use a proper cuff size. Ask your caregiver if you are not sure about your cuff size.   Most home blood pressure cuffs are automatic. They will measure systolic and diastolic pressures. The systolic pressure is the pressure reading at the start of sounds. Diastolic pressure is the pressure at which the sounds disappear. If you are elderly, measure pressures in multiple postures. Try sitting, lying or standing.   Sit at rest for a minimum of   5 minutes before taking measurements.   You should not be on any medications like decongestants. These are found in many cold medications.   Record your blood pressure readings and review them with your caregiver.  If you have hypertension:  Your caregiver  may do tests to be sure you do not have secondary hypertension (see "causes" above).   Your caregiver may also look for signs of metabolic syndrome. This is also called Syndrome X or Insulin Resistance Syndrome. You may have this syndrome if you have type 2 diabetes, abdominal obesity, and abnormal blood lipids in addition to hypertension.   Your caregiver will take your medical and family history and perform a physical exam.   Diagnostic tests may include blood tests (for glucose, cholesterol, potassium, and kidney function), a urinalysis, or an EKG. Other tests may also be necessary depending on your condition.  PREVENTION  There are important lifestyle issues that you can adopt to reduce your chance of developing hypertension:  Maintain a normal weight.   Limit the amount of salt (sodium) in your diet.   Exercise often.   Limit alcohol intake.   Get enough potassium in your diet. Discuss specific advice with your caregiver.   Follow a DASH diet (dietary approaches to stop hypertension). This diet is rich in fruits, vegetables, and low-fat dairy products, and avoids certain fats.  PROGNOSIS  Essential hypertension cannot be cured. Lifestyle changes and medical treatment can lower blood pressure and reduce complications. The prognosis of secondary hypertension depends on the underlying cause. Many people whose hypertension is controlled with medicine or lifestyle changes can live a normal, healthy life.  RISKS AND COMPLICATIONS  While high blood pressure alone is not an illness, it often requires treatment due to its short- and long-term effects on many organs. Hypertension increases your risk for:  CVAs or strokes (cerebrovascular accident).   Heart failure due to chronically high blood pressure (hypertensive cardiomyopathy).   Heart attack (myocardial infarction).   Damage to the retina (hypertensive retinopathy).   Kidney failure (hypertensive nephropathy).  Your caregiver can  explain list items above that apply to you. Treatment of hypertension can significantly reduce the risk of complications. TREATMENT   For overweight patients, weight loss and regular exercise are recommended. Physical fitness lowers blood pressure.   Mild hypertension is usually treated with diet and exercise. A diet rich in fruits and vegetables, fat-free dairy products, and foods low in fat and salt (sodium) can help lower blood pressure. Decreasing salt intake decreases blood pressure in a 1/3 of people.   Stop smoking if you are a smoker.  The steps above are highly effective in reducing blood pressure. While these actions are easy to suggest, they are difficult to achieve. Most patients with moderate or severe hypertension end up requiring medications to bring their blood pressure down to a normal level. There are several classes of medications for treatment. Blood pressure pills (antihypertensives) will lower blood pressure by their different actions. Lowering the blood pressure by 10 mmHg may decrease the risk of complications by as much as 25%. The goal of treatment is effective blood pressure control. This will reduce your risk for complications. Your caregiver will help you determine the best treatment for you according to your lifestyle. What is excellent treatment for one person, may not be for you. HOME CARE INSTRUCTIONS   Do not smoke.   Follow the lifestyle changes outlined in the "Prevention" section.   If you are on medications, follow the directions   carefully. Blood pressure medications must be taken as prescribed. Skipping doses reduces their benefit. It also puts you at risk for problems.   Follow up with your caregiver, as directed.   If you are asked to monitor your blood pressure at home, follow the guidelines in the "Diagnosis" section above.  SEEK MEDICAL CARE IF:   You think you are having medication side effects.   You have recurrent headaches or lightheadedness.     You have swelling in your ankles.   You have trouble with your vision.  SEEK IMMEDIATE MEDICAL CARE IF:   You have sudden onset of chest pain or pressure, difficulty breathing, or other symptoms of a heart attack.   You have a severe headache.   You have symptoms of a stroke (such as sudden weakness, difficulty speaking, difficulty walking).  MAKE SURE YOU:   Understand these instructions.   Will watch your condition.   Will get help right away if you are not doing well or get worse.  Document Released: 10/22/2005 Document Revised: 07/04/2011 Document Reviewed: 05/22/2007 [facility] Patient Information 2012 [place], [place].  Diabetes and Exercise Regular exercise is important and can help:   Control blood glucose (sugar).   Decrease blood pressure.    Control blood lipids (cholesterol, triglycerides).   Improve overall health.  BENEFITS FROM EXERCISE  Improved fitness.   Improved flexibility.   Improved endurance.   Increased bone density.   Weight control.   Increased muscle strength.   Decreased body fat.   Improvement of the body's use of insulin, a hormone.   Increased insulin sensitivity.   Reduction of insulin needs.   Reduced stress and tension.   Helps you feel better.  People with diabetes who add exercise to their lifestyle gain additional benefits, including:  Weight loss.   Reduced appetite.   Improvement of the body's use of blood glucose.   Decreased risk factors for heart disease:   Lowering of cholesterol and triglycerides.   Raising the level of good cholesterol (high-density lipoproteins, HDL).   Lowering blood sugar.   Decreased blood pressure.  TYPE 1 DIABETES AND EXERCISE  Exercise will usually lower your blood glucose.   If blood glucose is greater than 240 mg/dl, check urine ketones. If ketones are present, do not exercise.   Location of the insulin injection sites may need to be adjusted with exercise. Avoid  injecting insulin into areas of the body that will be exercised. For example, avoid injecting insulin into:   The arms when playing tennis.   The legs when jogging. For more information, discuss this with your caregiver.   Keep a record of:   Food intake.   Type and amount of exercise.   Expected peak times of insulin action.   Blood glucose levels.  Do this before, during, and after exercise. Review your records with your caregiver. This will help you to develop guidelines for adjusting food intake and insulin amounts.  TYPE 2 DIABETES AND EXERCISE  Regular physical activity can help control blood glucose.   Exercise is important because it may:   Increase the body's sensitivity to insulin.   Improve blood glucose control.   Exercise reduces the risk of heart disease. It decreases serum cholesterol and triglycerides. It also lowers blood pressure.   Those who take insulin or oral hypoglycemic agents should watch for signs of hypoglycemia. These signs include dizziness, shaking, sweating, chills, and confusion.   Body water is lost during exercise. It must be replaced.  This will help to avoid loss of body fluids (dehydration) or heat stroke.  Be sure to talk to your caregiver before starting an exercise program to make sure it is safe for you. Remember, any activity is better than none.  Document Released: 01/12/2004 Document Revised: 07/04/2011 Document Reviewed: 04/28/2009 [facility] Patient Information 2012 [place], [place].

## 2011-12-31 NOTE — Progress Notes (Signed)
PT NOW HAS MEDICAID AND WILL BE SEEN AT [facility] FOR A F/U APPT ON April 8TH AT 215 PM WITH DR. VOLLMER. Willa Rough [phone] OR [phone] 12/31/2011

## 2012-01-01 NOTE — Progress Notes (Signed)
Received a call from [facility] in ref to an appt date change for Friday March 1st at 1130am with Dr. Philipp Deputy. Tried to call all numbers for the pt and all are non working.  Will inform [facility]. Willa Rough 01/01/2012 [phone] OR [phone]

## 2012-01-19 ENCOUNTER — Encounter (HOSPITAL_COMMUNITY): Payer: Self-pay | Admitting: Emergency Medicine

## 2012-01-19 ENCOUNTER — Emergency Department (HOSPITAL_COMMUNITY)
Admission: EM | Admit: 2012-01-19 | Discharge: 2012-01-20 | Disposition: A | Discharge status: Home / Self Care | Payer: Medicaid Other | Attending: Emergency Medicine | Admitting: Emergency Medicine

## 2012-01-19 DIAGNOSIS — I10 Essential (primary) hypertension: Secondary | ICD-10-CM | POA: Insufficient documentation

## 2012-01-19 DIAGNOSIS — I2510 Atherosclerotic heart disease of native coronary artery without angina pectoris: Secondary | ICD-10-CM | POA: Insufficient documentation

## 2012-01-19 DIAGNOSIS — L02412 Cutaneous abscess of left axilla: Secondary | ICD-10-CM

## 2012-01-19 DIAGNOSIS — IMO0002 Reserved for concepts with insufficient information to code with codable children: Secondary | ICD-10-CM | POA: Insufficient documentation

## 2012-01-19 DIAGNOSIS — E119 Type 2 diabetes mellitus without complications: Secondary | ICD-10-CM | POA: Insufficient documentation

## 2012-01-19 DIAGNOSIS — Z79899 Other long term (current) drug therapy: Secondary | ICD-10-CM | POA: Insufficient documentation

## 2012-01-19 MED ORDER — LIDOCAINE-EPINEPHRINE (PF) 2 %-1:200000 IJ SOLN
5.0000 mL | Freq: Once | INTRAMUSCULAR | Status: AC
  Administered 2012-01-20: 5 mL via INTRADERMAL

## 2012-01-19 NOTE — ED Notes (Addendum)
EDP made aware that I/D at bedside. Will continue to monitor.

## 2012-01-19 NOTE — ED Notes (Signed)
Pt reports painful abscess Left axcillary area onset x 4 days no drainage noted

## 2012-01-19 NOTE — ED Notes (Signed)
Pt stated that she has had a boil L underarm x 5 days. No redness or odor. Pain and clear white drainage present. Pt states that underarm boils are a chronic issue for the pt. Her last I/D was in 1980s. Will continue to monitor.

## 2012-01-19 NOTE — ED Provider Notes (Signed)
History     CSN: [id number]  Arrival date & time 01/19/12  2122   First MD Initiated Contact with Patient 01/19/12 2256      Chief Complaint  Patient presents with  . Abscess     Left axcillary    (Consider location/radiation/quality/duration/timing/severity/associated sxs/prior treatment) HPI Comments: Patient with a history of diabetes, hypertension, and CAD presents emergency Department with a chief complaint of left axilla abscess.  Patient states she thinks there may be more than 1 and that they are currently draining.  Patient rates pain as a 10 of 10 in severity.  Patient denies fevers, night sweats, chills.   Patient is a 51 y.o. female presenting with abscess. The history is provided by the patient.  Abscess  This is a new problem. Episode onset: 4 days ago. The onset was sudden. The problem occurs occasionally. The problem has been gradually worsening. Affected Location: left axilla. The problem is moderate. The abscess is characterized by draining. It is unknown what she was exposed to. The abscess first occurred at home. Pertinent negatives include no anorexia, no decrease in physical activity, not sleeping less, not drinking less, no fever, no fussiness, not sleeping more, no diarrhea, no vomiting, no congestion, no rhinorrhea, no sore throat, no decreased responsiveness and no cough. Her past medical history is significant for skin abscesses in family. There were no sick contacts.    Past Medical History  Diagnosis Date  . Hypertension   . Diabetes mellitus   . High cholesterol   . Hyperthyroidism   . Coronary artery disease     History reviewed. No pertinent past surgical history.  History reviewed. No pertinent family history.  History  Substance Use Topics  . Smoking status: Never Smoker   . Smokeless tobacco: Not on file  . Alcohol Use: No    OB History    Grav Para Term Preterm Abortions TAB SAB Ect Mult Living                  Review of Systems    Constitutional: Negative for fever, chills, appetite change and decreased responsiveness.  HENT: Negative for congestion, sore throat and rhinorrhea.   Eyes: Negative for visual disturbance.  Respiratory: Negative for cough and shortness of breath.   Cardiovascular: Negative for chest pain and leg swelling.  Gastrointestinal: Negative for vomiting, abdominal pain, diarrhea and anorexia.  Genitourinary: Negative for dysuria, urgency and frequency.  Skin:       Abscess   Neurological: Negative for dizziness, syncope, weakness, light-headedness, numbness and headaches.  Psychiatric/Behavioral: Negative for confusion.  All other systems reviewed and are negative.    Allergies  Review of patient's allergies indicates no known allergies.  Home Medications   Current Outpatient Rx  Name Route Sig Dispense Refill  . AMLODIPINE BESYLATE 10 MG PO TABS Oral Take 10 mg by mouth daily.    . ASPIRIN EC 81 MG PO TBEC Oral Take 81 mg by mouth daily.    Marland Kitchen CLONIDINE HCL 0.2 MG PO TABS Oral Take 0.2 mg by mouth 3 (three) times daily.    Marland Kitchen FEXOFENADINE HCL 180 MG PO TABS Oral Take 180 mg by mouth daily as needed. For seasonal allergies    . GLIPIZIDE 10 MG PO TABS Oral Take 5 mg by mouth 2 (two) times daily before a meal.    . IRBESARTAN 300 MG PO TABS Oral Take 300 mg by mouth daily.    Marland Kitchen METFORMIN HCL 1000 MG PO TABS  Oral Take 1,000 mg by mouth 2 (two) times daily with a meal.    . METOPROLOL TARTRATE 100 MG PO TABS Oral Take 50 mg by mouth 2 (two) times daily.    . TRIAMCINOLONE ACETONIDE 55 MCG/ACT NA INHA Nasal Place 1-2 sprays into the nose daily.      BP 153/84  Pulse 76  Temp(Src) 99.5 F (37.5 C) (Oral)  Resp 18  SpO2 95%  Physical Exam  Nursing note and vitals reviewed. Constitutional: She is oriented to person, place, and time. She appears well-developed and well-nourished. She does not have a sickly appearance. She does not appear ill. No distress.  HENT:  Head: Normocephalic  and atraumatic.  Eyes: Conjunctivae and EOM are normal.  Neck: Normal range of motion. Neck supple.  Cardiovascular: Normal rate and regular rhythm.   Pulmonary/Chest: Effort normal and breath sounds normal.  Musculoskeletal: She exhibits no edema.  Lymphadenopathy:       Head (right side): No submental, no preauricular and no posterior auricular adenopathy present.       Head (left side): No submental, no submandibular, no preauricular and no posterior auricular adenopathy present.    She has no axillary adenopathy.  Neurological: She is alert and oriented to person, place, and time.  Skin: Skin is warm and dry. No rash noted. She is not diaphoretic.       4 abscesses located in left axilla. The largest being 3cm x 2 cm. Extreme tenderness to palpation. 2 abscesses currently draining. Abscesses are fluctuant without warmth, surrounding erythema, or induration.    ED Course  Procedures (including critical care time)  Labs Reviewed - No data to display No results found.   No diagnosis found.  INCISION AND DRAINAGE x3 (all in same location) Performed by: Jaci Carrel Consent: Verbal consent obtained. Risks and benefits: risks, benefits and alternatives were discussed Type: abscess  Body area: Left axilla  Anesthesia: local infiltration  Local anesthetic: lidocaine 2% w epinephrine  Anesthetic total: 3 ml  Complexity: complex Blunt dissection to break up loculations  Drainage: purulent  Drainage amount: large   Packing material: 1/4 in iodoform gauze (only 2 of the 3 abscesses drained were packed)   Patient tolerance: Patient tolerated the procedure well with no immediate complications.     MDM  Abscess drainage  Patient with skin abscess amenable to incision and drainage. 2 of the Abscesses were large enough to warrant packing with removal and wound recheck in 2 days. No signs of cellulitis is surrounding skin.  Will d/c to home. Antibiotic therapy given d/t amt  of abscesses and pt hx of DM. Pt advised to follow up with general surgery as well for likely Hidradenitis suppurativa.     Medical screening examination/treatment/procedure(s) were performed by non-physician practitioner and as supervising physician I was immediately available for consultation/collaboration. Osvaldo Human, M.D.    Jaci Carrel, PA-C 01/20/12 0014  Carleene Cooper III, MD 01/20/12 [phone]

## 2012-01-19 NOTE — ED Notes (Signed)
EDP at bedside  

## 2012-01-20 MED ORDER — SULFAMETHOXAZOLE-TRIMETHOPRIM 800-160 MG PO TABS: 2.0000 | ORAL_TABLET | Freq: Two times a day (BID) | ORAL | Status: AC

## 2012-01-20 MED ORDER — HYDROCODONE-ACETAMINOPHEN 5-325 MG PO TABS: 1.0000 | ORAL_TABLET | Freq: Four times a day (QID) | ORAL | Status: AC | PRN

## 2012-01-20 NOTE — Discharge Instructions (Signed)
Followup with your doctor or an urgent care in order to remove your packing in 48-72 hours. Call Dr. Magnus Ivan to discuss possible surgery. Take your full coarse of antibiotics as prescribed. You may return to the emergency department if you have  a fever that persists greater than 101 or your abscess appears to become infected (growing surrounding redness and warmth). Do not operate any heavy machinery while on pain medications. Do not consume alcohol on these medications either.  Abscess An abscess (boil or furuncle) is an infected area that contains a collection of pus.  SYMPTOMS Signs and symptoms of an abscess include pain, tenderness, redness, or hardness. You may feel a moveable soft area under your skin. An abscess can occur anywhere in the body.  TREATMENT  A surgical cut (incision) may be made over your abscess to drain the pus. Gauze may be packed into the space or a drain may be looped through the abscess cavity (pocket). This provides a drain that will allow the cavity to heal from the inside outwards. The abscess may be painful for a few days, but should feel much better if it was drained.  Your abscess, if seen early, may not have localized and may not have been drained. If not, another appointment may be required if it does not get better on its own or with medications. HOME CARE INSTRUCTIONS   Only take over-the-counter or prescription medicines for pain, discomfort, or fever as directed by your caregiver.   Take your antibiotics as directed if they were prescribed. Finish them even if you start to feel better.   Keep the skin and clothes clean around your abscess.   If the abscess was drained, you will need to use gauze dressing to collect any draining pus. Dressings will typically need to be changed 3 or more times a day.   The infection may spread by skin contact with others. Avoid skin contact as much as possible.   Practice good hygiene. This includes regular hand washing,  cover any draining skin lesions, and do not share personal care items.   If you participate in sports, do not share athletic equipment, towels, whirlpools, or personal care items. Shower after every practice or tournament.   If a draining area cannot be adequately covered:   Do not participate in sports.   Children should not participate in day care until the wound has healed or drainage stops.   If your caregiver has given you a follow-up appointment, it is very important to keep that appointment. Not keeping the appointment could result in a much worse infection, chronic or permanent injury, pain, and disability. If there is any problem keeping the appointment, you must call back to this facility for assistance.  SEEK MEDICAL CARE IF:   You develop increased pain, swelling, redness, drainage, or bleeding in the wound site.   You develop signs of generalized infection including muscle aches, chills, fever, or a general ill feeling.   You have an oral temperature above 102 F (38.9 C).  MAKE SURE YOU:   Understand these instructions.   Will watch your condition.   Will get help right away if you are not doing well or get worse.  Document Released: 08/01/2005 Document Revised: 07/04/2011 Document Reviewed: 05/25/2008 [facility] Patient Information 2012 [place], [place].  RESOURCE GUIDE  Dental Problems  Patients with Medicaid: [facility]                     [place]  [facility] [address]                                           [phone] Phone:  [phone]                                                  Phone:  [phone]  If unable to pay or uninsured, contact:  [facility] or [facility] to become qualified for the adult dental clinic.  Chronic Pain Problems Contact [facility]  [phone] Patients need to be referred by their primary care doctor.  Insufficient Money for Medicine Contact [employer] Way:  call  "211" or [facility] 2525645083.  No Primary Care Doctor Call [facility]  [phone] Other agencies that provide inexpensive medical care    [facility]  [phone]    [facility] Internal Medicine  [phone]    [facility]  [phone]    [facility]  [phone]    [facility]  234-136-3022    [facility]  [phone]  Psychological Services [facility] Behavioral Health  [phone] [facility] Services  [phone] [facility] Mental Health   [phone] (emergency services (763)[phone])  Substance Abuse Resources [facility]  4063029381 [facility] [phone] [place] 501-300-7963 Floydene Flock (484) [phone] Residential & Outpatient Substance Abuse Program  774-118-1572  Abuse/Neglect [facility] Child Abuse Hotline 901-620-8572 [facility] Child Abuse Hotline 917-118-9654 (After Hours)  Emergency Shelter [facility] 773-689-8916  Maternity Homes [facility] [place] [facility] [phone] [facility] Services [phone]  MRSA Hotline #:   (440) 838-2780    [facility] Resources  [facility]     [employer]                          [facility] Dept. [address] [place]                       [address]      [address]  Blondell Reveal Phone:  [phone]                                   Phone:  [phone]                 Phone:  [phone]  [facility] Mental Health Phone:  [phone]  [facility] Child Abuse Hotline (313)042-3450 (234)[phone] (After Hours)

## 2012-01-21 ENCOUNTER — Emergency Department (HOSPITAL_COMMUNITY)
Admission: EM | Admit: 2012-01-21 | Discharge: 2012-01-21 | Disposition: A | Discharge status: Home / Self Care | Payer: Medicaid Other | Source: Home / Self Care | Attending: Emergency Medicine | Admitting: Emergency Medicine

## 2012-01-21 ENCOUNTER — Encounter (HOSPITAL_COMMUNITY): Payer: Self-pay | Admitting: *Deleted

## 2012-01-21 DIAGNOSIS — L0390 Cellulitis, unspecified: Secondary | ICD-10-CM

## 2012-01-21 DIAGNOSIS — L0291 Cutaneous abscess, unspecified: Secondary | ICD-10-CM

## 2012-01-21 HISTORY — DX: Other allergy status, other than to drugs and biological substances: Z91.09

## 2012-01-21 NOTE — ED Provider Notes (Signed)
Chief Complaint  Patient presents with  . Wound Check    History of Present Illness:   Mrs. Gatt is a 51 year old female who last week developed a large abscess in her left axilla. She went to the emergency room on March 16, 2 days ago and had multiple abscesses I&D. She was placed on Septra and hydrocodone for pain. The abscesses were packed. She returns today for removal of packing. The abscesses feel better, but they're still draining. She has had no fever or chills. She was referred to a surgeon for possible treatment of hidradenitis suppurativa.  Review of Systems:  Other than noted above, the patient denies any of the following symptoms: Systemic:  No fever, chills, sweats, weight loss, or fatigue. ENT:  No nasal congestion, rhinorrhea, sore throat, swelling of lips, tongue or throat. Resp:  No cough, wheezing, or shortness of breath. Skin:  No rash, itching, nodules, or suspicious lesions.  PMFSH:  Past medical history, family history, social history, meds, and allergies were reviewed.  Physical Exam:   Vital signs:  BP 161/94  Pulse 66  Temp(Src) 97.9 F (36.6 C) (Oral)  Resp 20  SpO2 97%  LMP 01/14/2012 Gen:  Alert, oriented, in no distress. Skin:  The bandage was removed and she has 2 I&D sites. The more inferior of these 2 was still very large and draining yellow pus. The superior one was not draining a pus and did not appear to be inflamed.  Course in Urgent Care Center:   The packing was removed from both of these lesions. A large amount of pus was expressed from the lower abscess which was very large. This was then repacked and further dressing was applied. She was instructed to leave this dressing in place, continue with her antibiotics, and return in 48 hours for recheck. The packing was left out over the more superior of the 2 lesions.  Assessment:   Diagnoses that have been ruled out:  None  Diagnoses that are still under consideration:  None  Final diagnoses:    Abscess    Plan:   1.  The following meds were prescribed:   New Prescriptions   No medications on file   2.  The patient was instructed in symptomatic care and handouts were given. 3.  The patient was told to return if becoming worse in any way, if no better in 3 or 4 days, and given some red flag symptoms that would indicate earlier return.     Reuben Likes, MD 01/21/12 2123

## 2012-01-21 NOTE — Discharge Instructions (Signed)

## 2012-01-21 NOTE — ED Notes (Signed)
Had I&D left axillary abscess 3/16 with packing.  Moist, purulent drainage noted around area - states area is feeling a little better, but she did not start the Bactrim DS until today "because I forgot about it".  Denies any fevers.

## 2012-01-22 ENCOUNTER — Encounter (HOSPITAL_COMMUNITY): Payer: Self-pay | Admitting: Emergency Medicine

## 2012-01-22 ENCOUNTER — Emergency Department (INDEPENDENT_AMBULATORY_CARE_PROVIDER_SITE_OTHER)
Admission: EM | Admit: 2012-01-22 | Discharge: 2012-01-22 | Disposition: A | Discharge status: Home / Self Care | Payer: Medicaid Other | Source: Home / Self Care | Attending: Family Medicine | Admitting: Family Medicine

## 2012-01-22 DIAGNOSIS — L02412 Cutaneous abscess of left axilla: Secondary | ICD-10-CM

## 2012-01-22 DIAGNOSIS — IMO0002 Reserved for concepts with insufficient information to code with codable children: Secondary | ICD-10-CM

## 2012-01-22 NOTE — Discharge Instructions (Signed)
Your infection looks to be improving. Continue taking your antibiotic as prescribed until it is gone. Change your dressings at least twice a day until the incision sites have healed. Schedule an appt with the surgeon for follow up as planned.

## 2012-01-22 NOTE — ED Provider Notes (Signed)
Medical screening examination/treatment/procedure(s) were performed by non-physician practitioner and as supervising physician I was immediately available for consultation/collaboration.  LANEY,RONNIE   Deeric Cruise B Laney, MD 01/22/12 2245 

## 2012-01-22 NOTE — ED Notes (Signed)
PT HERE FOR WOUND RECHECK LEFT AXILLA S/P I/D WITH PACKING Saturday AND REPACKING YESTERDAY.PT STATES THIS AM PACKING FELL OUT.SORENESS NOTED WITH MIN BROWNISH DRAINAGE.PT TAKING PRESCRIBED ATB'S.USING WARM COMPRESS AND TENDERNESS SEEN

## 2012-01-22 NOTE — ED Notes (Signed)
4X4 DRESSING APPLIED AND COVERED WITH MEDIPORE TAPE TO LEFT AXILLA

## 2012-01-22 NOTE — ED Provider Notes (Signed)
History     CSN: [id number]  Arrival date & time 01/22/12  4098   First MD Initiated Contact with Patient 01/22/12 2000      Chief Complaint  Patient presents with  . Wound Check    (Consider location/radiation/quality/duration/timing/severity/associated sxs/prior treatment) HPI Comments: Patient presents today for recheck of her abscesses in her left axilla. She was initially seen in the ED and had I&D is performed. She was then seen here in urgent care 2 days ago for recheck and had one of the area is repacked. She reports a significant decrease in the amount of drainage that she was having. She also states that the discomfort and swelling that she had is also improved. She is taking her antibiotics as prescribed without side effects. She was referred to a surgeon and admits that she has not call for an appointment yet but plans to do so tomorrow.   Past Medical History  Diagnosis Date  . Hypertension   . Diabetes mellitus   . High cholesterol   . Hyperthyroidism   . Coronary artery disease   . Environmental allergies     History reviewed. No pertinent past surgical history.  History reviewed. No pertinent family history.  History  Substance Use Topics  . Smoking status: Never Smoker   . Smokeless tobacco: Not on file  . Alcohol Use: No    OB History    Grav Para Term Preterm Abortions TAB SAB Ect Mult Living                  Review of Systems  Constitutional: Negative for fever and chills.  Gastrointestinal: Negative for vomiting, abdominal pain and diarrhea.    Allergies  Review of patient's allergies indicates no known allergies.  Home Medications   Current Outpatient Rx  Name Route Sig Dispense Refill  . SULFAMETHOXAZOLE-TRIMETHOPRIM 800-160 MG PO TABS Oral Take 2 tablets by mouth 2 (two) times daily. 40 tablet 0  . AMLODIPINE BESYLATE 10 MG PO TABS Oral Take 10 mg by mouth daily.    . ASPIRIN EC 81 MG PO TBEC Oral Take 81 mg by mouth daily.    Marland Kitchen  CLONIDINE HCL 0.2 MG PO TABS Oral Take 0.2 mg by mouth 2 (two) times daily.     Marland Kitchen FEXOFENADINE HCL 180 MG PO TABS Oral Take 180 mg by mouth daily as needed. For seasonal allergies    . GLIPIZIDE 10 MG PO TABS Oral Take 10 mg by mouth 2 (two) times daily before a meal.     . HYDROCODONE-ACETAMINOPHEN 5-325 MG PO TABS Oral Take 1 tablet by mouth every 6 (six) hours as needed for pain. 15 tablet 0  . IRBESARTAN 300 MG PO TABS Oral Take 300 mg by mouth daily.    Marland Kitchen METFORMIN HCL 1000 MG PO TABS Oral Take 1,000 mg by mouth 2 (two) times daily with a meal.    . METOPROLOL TARTRATE 100 MG PO TABS Oral Take 100 mg by mouth 2 (two) times daily.     . TRIAMCINOLONE ACETONIDE 55 MCG/ACT NA INHA Nasal Place 1-2 sprays into the nose daily.      BP 153/88  Pulse 70  Temp(Src) 98.4 F (36.9 C) (Oral)  Resp 20  SpO2 98%  LMP 01/14/2012  Physical Exam  Nursing note and vitals reviewed. Constitutional: She appears well-developed and well-nourished. No distress.  HENT:  Head: Normocephalic and atraumatic.  Neurological: She is alert.  Skin: Skin is warm and dry.  Lt axilla - incision sites noted without drainage, unable to express any pus. Induration and scarring without erythema in axilla. No palpable abscess and is nontender.   Psychiatric: She has a normal mood and affect.    ED Course  Procedures (including critical care time)  Labs Reviewed - No data to display No results found.   1. Abscess of axilla, left       MDM  Significantly improved abscesses Lt axilla.         Melody Comas, [place] 01/22/12 2157

## 2012-04-22 ENCOUNTER — Encounter: Payer: Self-pay | Admitting: Cardiovascular Disease

## 2012-04-22 ENCOUNTER — Ambulatory Visit (INDEPENDENT_AMBULATORY_CARE_PROVIDER_SITE_OTHER): Payer: Medicaid Other | Admitting: Cardiovascular Disease

## 2012-04-22 VITALS — BP 177/91 | HR 62 | Ht 66.0 in | Wt 304.0 lb

## 2012-04-22 DIAGNOSIS — E7800 Pure hypercholesterolemia, unspecified: Secondary | ICD-10-CM

## 2012-04-22 DIAGNOSIS — I10 Essential (primary) hypertension: Secondary | ICD-10-CM

## 2012-04-22 DIAGNOSIS — E119 Type 2 diabetes mellitus without complications: Secondary | ICD-10-CM

## 2012-04-22 MED ORDER — ATORVASTATIN CALCIUM 10 MG PO TABS: 10.0000 mg | ORAL_TABLET | Freq: Every day | ORAL | Status: DC

## 2012-04-22 MED ORDER — GLIPIZIDE 10 MG PO TABS: 5.0000 mg | ORAL_TABLET | Freq: Two times a day (BID) | ORAL | Status: DC

## 2012-04-22 MED ORDER — METOPROLOL TARTRATE 100 MG PO TABS: 50.0000 mg | ORAL_TABLET | Freq: Two times a day (BID) | ORAL | Status: DC

## 2012-04-22 MED ORDER — METFORMIN HCL 1000 MG PO TABS: 1000.0000 mg | ORAL_TABLET | Freq: Two times a day (BID) | ORAL | Status: DC

## 2012-04-22 MED ORDER — LOSARTAN POTASSIUM 100 MG PO TABS: 100.0000 mg | ORAL_TABLET | Freq: Every day | ORAL | Status: DC

## 2012-04-22 MED ORDER — HYDROCHLOROTHIAZIDE 25 MG PO TABS: 25.0000 mg | ORAL_TABLET | Freq: Every day | ORAL | Status: DC

## 2012-04-22 MED ORDER — AMLODIPINE BESYLATE 10 MG PO TABS: 10.0000 mg | ORAL_TABLET | Freq: Every day | ORAL | Status: DC

## 2012-04-22 MED ORDER — CLONIDINE HCL 0.2 MG PO TABS: 0.1000 mg | ORAL_TABLET | Freq: Two times a day (BID) | ORAL | Status: DC

## 2012-04-22 NOTE — Patient Instructions (Signed)
Your physician recommends that you schedule a follow-up appointment as needed with Dr. Lisette Grinder have been referred to the [facility] at [facility]. Their phone number is [phone]

## 2012-04-22 NOTE — Progress Notes (Signed)
History of Present Illness: 51 yo AAF with history of HTN, DM, HLD, hyperthyroidism admitted to [facility] 12/29/11 with SOB, chest discomfort and found to have hypertensive emergency (250/150 in ED). She developed pulmonary edema that was felt to be due to her HTN crisis. Her BP was treated and pulmonary edema resolved. EKG in the hospital showed non-specific changes. No echo was performed. LE dopplers were negative.   She tells me that she has been feeling well. She denies any chest pain or  SOB, LE edema, Headaches, orthopnea. She has been lost to f/u in primary care. She came in to see [place] today to get meds refilled. She has not been seen in our office before. She has no cardiac issues. She has been out of her Cozaar. She has been taking other BP meds. She has been out of Lipitor for months. She has been out of methimazole for 6 months. Last TSH 2/13 was 1.1.   Primary Care Physician: None  Past Medical History  Diagnosis Date  . Hypertension   . Diabetes mellitus   . High cholesterol   . Hyperthyroidism   .    Marland Kitchen Environmental allergies     Past Surgical History  Procedure Date  . Cesarean section     x2    Current Outpatient Prescriptions  Medication Sig Dispense Refill  . amLODipine (NORVASC) 10 MG tablet Take 10 mg by mouth daily.      Marland Kitchen aspirin EC 81 MG tablet Take 81 mg by mouth daily.      . cloNIDine (CATAPRES) 0.2 MG tablet Take 1/2 tab twice a day      . fexofenadine (ALLEGRA) 180 MG tablet Take 180 mg by mouth daily as needed. For seasonal allergies      . glipiZIDE (GLUCOTROL) 10 MG tablet Take 10 mg by mouth 2 (two) times daily before a meal.       . hydrochlorothiazide (HYDRODIURIL) 25 MG tablet Take 25 mg by mouth daily.      . metFORMIN (GLUCOPHAGE) 1000 MG tablet Take 1,000 mg by mouth 2 (two) times daily with a meal.      . triamcinolone (NASACORT AQ) 55 MCG/ACT nasal inhaler Place 1-2 sprays into the nose daily.      Marland Kitchen DISCONTD: sertraline (ZOLOFT) 50  MG tablet Take 50 mg by mouth daily.        No Known Allergies  History   Social History  . Marital Status: Single    Spouse Name: N/A    Number of Children: 3  . Years of Education: N/A   Occupational History  . unemployed    Social History Main Topics  . Smoking status: Never Smoker   . Smokeless tobacco: Not on file  . Alcohol Use: No  . Drug Use: No  . Sexually Active:    Other Topics Concern  . Not on file   Social History Narrative  . No narrative on file    Family History  Problem Relation Age of Onset  . Pneumonia Mother     Review of Systems:  As stated in the HPI and otherwise negative.   BP 177/91  Pulse 62  Ht 5\' 6"  (1.676 m)  Wt 304 lb (137.893 kg)  BMI 49.07 kg/m2  Physical Examination: General: Well developed, well nourished, NAD HEENT: OP clear, mucus membranes moist SKIN: warm, dry. No rashes. Neuro: No focal deficits Musculoskeletal: Muscle strength 5/5 all ext Psychiatric: Mood and affect normal Neck: No  JVD, no carotid bruits, no thyromegaly, no lymphadenopathy. Lungs:Clear bilaterally, no wheezes, rhonci, crackles Cardiovascular: Regular rate and rhythm. No murmurs, gallops or rubs. Abdomen:Soft. Bowel sounds present. Non-tender.  Extremities: No lower extremity edema. Pulses are 2 + in the bilateral DP/PT.

## 2012-04-22 NOTE — Assessment & Plan Note (Signed)
Will restart Lipitor. Follow up lipids and LFTs in 12 weeks in primary care.

## 2012-04-22 NOTE — Assessment & Plan Note (Signed)
She needs to establish in primary care for management of all of her chronic conditions including DM. Will refill glipizide and metformin for her. We have given her the name and number of the [facility] Teaching [facility] at [facility].

## 2012-04-22 NOTE — Assessment & Plan Note (Signed)
BP is elevated but she is out of her Cozaar. Will refill all anti-hypertensives today and restart Cozaar. She will follow up in primary care for titration of meds.

## 2012-05-30 ENCOUNTER — Encounter: Payer: Self-pay | Admitting: Emergency Medicine

## 2012-05-30 ENCOUNTER — Ambulatory Visit (INDEPENDENT_AMBULATORY_CARE_PROVIDER_SITE_OTHER): Payer: Medicaid Other | Admitting: Emergency Medicine

## 2012-05-30 VITALS — BP 191/117 | HR 61 | Ht 65.5 in | Wt 304.0 lb

## 2012-05-30 DIAGNOSIS — E119 Type 2 diabetes mellitus without complications: Secondary | ICD-10-CM

## 2012-05-30 DIAGNOSIS — I10 Essential (primary) hypertension: Secondary | ICD-10-CM

## 2012-05-30 DIAGNOSIS — E049 Nontoxic goiter, unspecified: Secondary | ICD-10-CM

## 2012-05-30 DIAGNOSIS — E7800 Pure hypercholesterolemia, unspecified: Secondary | ICD-10-CM

## 2012-05-30 MED ORDER — TERBINAFINE HCL 1 % EX CREA: TOPICAL_CREAM | Freq: Two times a day (BID) | CUTANEOUS | Status: DC

## 2012-05-30 NOTE — Assessment & Plan Note (Signed)
Quite elevated in clinic today.  Has not been taking Cozaar.  Patient to restart cozaar and return in 2 weeks for RN BP check.

## 2012-05-30 NOTE — Progress Notes (Signed)
  Subjective:    Patient ID: Andrea [name], female    DOB: 02/20/1961, 51 y.o.   MRN: [id number]  HPI Andrea [name] is here for new patient appointment.  I have reviewed and updated the following as appropriate: allergies, current medications, past family history, past medical history, past social history, past surgical history and problem list SHx: never smoker  Hypertension Well controlled: no Compliant with medication: no - has not been taking Cozaar Side effects from medication: no Check BP at home: no; does report feeling "off" when her BP is high  Chest pain: no Palpitations: no Vision changes: no Leg edema: no Dizziness: no  Diabetes Well controlled: no Compliant with medications: yes Side effects from medications: no Check sugars at home: yes - once a week  Sugar ranges: 140s  Polyuria: no Polydipsia: no Vision changes: no Hypoglycemic symptoms: no  Foot exam: done today Eye exam: due for eye exam Microalbumin: n/a  Hyperlipidemia Well controlled: unknown Compliant with medications: just restarted taking Lipitor last month Side effects from medications: no   Review of Systems See HPI    Objective:   Physical Exam BP 191/117  Pulse 61  Ht 5' 5.5" (1.664 m)  Wt 304 lb (137.893 kg)  BMI 49.82 kg/m2  LMP 03/30/2012 Gen: alert, cooperative, NAD HEENT: AT/[place], PERRL, EOMI, sclera white, MMM Neck: no LAD, mildly enlarged thyroid CV: RRR, no murmurs Pulm: CTAB, no wheezes or rales Abd: +BS, obese Ext: no edema Foot: 5/5 monofilament sensation bilaterally, no lesions, does have dry flaky skin over both soles     Assessment & Plan:

## 2012-05-30 NOTE — Assessment & Plan Note (Addendum)
LDL of 161 in 2011.  Restarted Lipitor last month.  Will recheck lipids at follow up in 3 months.

## 2012-05-30 NOTE — Assessment & Plan Note (Signed)
Thyroid studies normal in 12/2011.

## 2012-05-30 NOTE — Assessment & Plan Note (Signed)
A1c 7.7 today.  Reports only rare missed doses.  Discussed increasing exercise to 20 minutes brisk walking daily, she was agreeable to this.  Will recheck in 3 months.  May need to consider adding Januvia in still not controlled.

## 2012-05-30 NOTE — Patient Instructions (Addendum)
It was nice to meet you!  Please start taking the Cozaar and return to clinic in 2 weeks for a blood pressure check.  We will try an anti-fungal cream for your feet.  You should see some improvement in the next week, but you MUST use it for 2 whole weeks.  Please work towards a BRISK 20 minute walk every day.  Your heart rate has to go up!!  Follow up in 3 months to check on your diabetes.

## 2012-06-16 ENCOUNTER — Ambulatory Visit (INDEPENDENT_AMBULATORY_CARE_PROVIDER_SITE_OTHER): Payer: Medicaid Other | Admitting: *Deleted

## 2012-06-16 VITALS — BP 172/96 | HR 58

## 2012-06-16 DIAGNOSIS — I10 Essential (primary) hypertension: Secondary | ICD-10-CM

## 2012-06-16 NOTE — Progress Notes (Signed)
Patient in for BP check today. Reviewed meds and she reports she started cozaar as directed and then she felt it caused nausea so  after a few days she has been taking 1/2 tab in the AM and when she remembers she takes the other half at night.  Taking other meds as directed . However sometimes she forgets nightime meds  Last night  she did not take it or other night time meds .   BP today checked manually using large adult cuff. BP RA 170/98 and LA 172/96 pulse 58.  Will send message to MD to please advise.

## 2012-06-17 ENCOUNTER — Telehealth: Payer: Self-pay | Admitting: Emergency Medicine

## 2012-06-17 DIAGNOSIS — I10 Essential (primary) hypertension: Secondary | ICD-10-CM

## 2012-06-17 MED ORDER — CLONIDINE HCL 0.2 MG PO TABS: 0.2000 mg | ORAL_TABLET | Freq: Two times a day (BID) | ORAL | Status: DC

## 2012-06-17 NOTE — Progress Notes (Signed)
See note from Dr. Elwyn Reach from today 08/13. Increase clonidine to 0.2 mg twice daily. Patient notified. Marland Kitchen

## 2012-06-17 NOTE — Telephone Encounter (Signed)
Message from MD given.  Appointment scheduled for 08/26 for BP follow up.

## 2012-06-17 NOTE — Telephone Encounter (Signed)
Reviewed medications for hypertension.   Okay for patient to take cozaar 1/2 tab BID, but it is important for her to take both doses.  At max dose of HCTZ and amlodipine.  Unable to increase metoprolol given pulse in the 50s to 60s.  Will increase clonidine to 0.2mg  BID and have her come in for an appointment in 2 weeks. May need to consider work up for secondary causes of hypertension such as hyperaldosteronism, renal artery disease and OSA.

## 2012-06-30 ENCOUNTER — Ambulatory Visit: Payer: Medicaid Other | Admitting: Emergency Medicine

## 2012-12-13 ENCOUNTER — Encounter (HOSPITAL_COMMUNITY): Payer: Self-pay | Admitting: Adult Health

## 2012-12-13 ENCOUNTER — Emergency Department (HOSPITAL_COMMUNITY): Payer: Medicaid Other

## 2012-12-13 ENCOUNTER — Observation Stay (HOSPITAL_COMMUNITY)
Admission: EM | Admit: 2012-12-13 | Discharge: 2012-12-15 | Disposition: A | Discharge status: Home / Self Care | Payer: Medicaid Other | Attending: Family Medicine | Admitting: Family Medicine

## 2012-12-13 DIAGNOSIS — J301 Allergic rhinitis due to pollen: Secondary | ICD-10-CM

## 2012-12-13 DIAGNOSIS — E041 Nontoxic single thyroid nodule: Secondary | ICD-10-CM

## 2012-12-13 DIAGNOSIS — I10 Essential (primary) hypertension: Secondary | ICD-10-CM | POA: Insufficient documentation

## 2012-12-13 DIAGNOSIS — F329 Major depressive disorder, single episode, unspecified: Secondary | ICD-10-CM

## 2012-12-13 DIAGNOSIS — Z7902 Long term (current) use of antithrombotics/antiplatelets: Secondary | ICD-10-CM | POA: Insufficient documentation

## 2012-12-13 DIAGNOSIS — K219 Gastro-esophageal reflux disease without esophagitis: Secondary | ICD-10-CM

## 2012-12-13 DIAGNOSIS — N939 Abnormal uterine and vaginal bleeding, unspecified: Secondary | ICD-10-CM

## 2012-12-13 DIAGNOSIS — N949 Unspecified condition associated with female genital organs and menstrual cycle: Secondary | ICD-10-CM | POA: Insufficient documentation

## 2012-12-13 DIAGNOSIS — F411 Generalized anxiety disorder: Secondary | ICD-10-CM

## 2012-12-13 DIAGNOSIS — R42 Dizziness and giddiness: Secondary | ICD-10-CM | POA: Insufficient documentation

## 2012-12-13 DIAGNOSIS — E7800 Pure hypercholesterolemia, unspecified: Secondary | ICD-10-CM | POA: Insufficient documentation

## 2012-12-13 DIAGNOSIS — R079 Chest pain, unspecified: Principal | ICD-10-CM | POA: Insufficient documentation

## 2012-12-13 DIAGNOSIS — R110 Nausea: Secondary | ICD-10-CM | POA: Insufficient documentation

## 2012-12-13 DIAGNOSIS — K089 Disorder of teeth and supporting structures, unspecified: Secondary | ICD-10-CM

## 2012-12-13 DIAGNOSIS — E0590 Thyrotoxicosis, unspecified without thyrotoxic crisis or storm: Secondary | ICD-10-CM | POA: Insufficient documentation

## 2012-12-13 DIAGNOSIS — I161 Hypertensive emergency: Secondary | ICD-10-CM | POA: Diagnosis present

## 2012-12-13 DIAGNOSIS — Z79899 Other long term (current) drug therapy: Secondary | ICD-10-CM | POA: Insufficient documentation

## 2012-12-13 DIAGNOSIS — E119 Type 2 diabetes mellitus without complications: Secondary | ICD-10-CM | POA: Insufficient documentation

## 2012-12-13 DIAGNOSIS — N938 Other specified abnormal uterine and vaginal bleeding: Secondary | ICD-10-CM | POA: Insufficient documentation

## 2012-12-13 DIAGNOSIS — E785 Hyperlipidemia, unspecified: Secondary | ICD-10-CM | POA: Diagnosis present

## 2012-12-13 DIAGNOSIS — E0520 Thyrotoxicosis with toxic multinodular goiter without thyrotoxic crisis or storm: Secondary | ICD-10-CM

## 2012-12-13 DIAGNOSIS — E049 Nontoxic goiter, unspecified: Secondary | ICD-10-CM

## 2012-12-13 LAB — CBC
MCH: 28.6 pg (ref 26.0–34.0)
MCHC: 35.1 g/dL (ref 30.0–36.0)
Platelets: 346 10*3/uL (ref 150–400)
RBC: 4.86 MIL/uL (ref 3.87–5.11)

## 2012-12-13 LAB — BASIC METABOLIC PANEL
Calcium: 10.5 mg/dL (ref 8.4–10.5)
GFR calc non Af Amer: >90 mL/min (ref >90)
Sodium: 135 mEq/L (ref 135–145)

## 2012-12-13 LAB — POCT I-STAT TROPONIN I

## 2012-12-13 MED ORDER — ASPIRIN 325 MG PO TABS
325.0000 mg | ORAL_TABLET | Freq: Every day | ORAL | Status: DC
  Administered 2012-12-14 – 2012-12-15 (×3): 325 mg via ORAL
  Filled 2012-12-13 (×3): qty 1

## 2012-12-13 NOTE — ED Provider Notes (Signed)
History     CSN: [id number]  Arrival date & time 12/13/12  2049   First MD Initiated Contact with Patient 12/13/12 2215      Chief Complaint  Patient presents with  . Chest Pain  . Vaginal Bleeding    (Consider location/radiation/quality/duration/timing/severity/associated sxs/prior treatment) Patient is a 52 y.o. female presenting with chest pain. The history is provided by the patient.  Chest Pain Pain location:  Substernal area Pain quality: pressure   Pain radiates to:  L shoulder Pain radiates to the back: no   Pain severity:  Moderate Onset quality:  Gradual Timing:  Intermittent Progression:  Waxing and waning Chronicity:  Recurrent Relieved by:  Nothing Worsened by:  Nothing tried Ineffective treatments:  None tried Associated symptoms: dizziness and nausea   Associated symptoms: no abdominal pain, no back pain, no fatigue, no fever, no headache, no palpitations, no shortness of breath, not vomiting and no weakness   Risk factors: diabetes mellitus, high cholesterol and hypertension     Past Medical History  Diagnosis Date  . Hypertension   . Diabetes mellitus   . High cholesterol   . Hyperthyroidism     Radioactive iodine ablation  . Environmental allergies     Past Surgical History  Procedure Laterality Date  . Cesarean section      x2    Family History  Problem Relation Age of Onset  . Pneumonia Mother   . Diabetes Mother   . Hypertension Father   . Hypertension Sister   . Hyperlipidemia Sister   . Hypertension Brother   . Hypertension Maternal Aunt   . Hypertension Paternal Aunt   . Cancer Paternal Aunt     throat cancer  . Diabetes Maternal Grandmother   . Diabetes Paternal Grandmother   . Cancer Paternal Grandmother     pancreatic cancer    History  Substance Use Topics  . Smoking status: Never Smoker   . Smokeless tobacco: Never Used  . Alcohol Use: No     Comment: occasional for special occasions    OB History   Grav Para  Term Preterm Abortions TAB SAB Ect Mult Living                  Review of Systems  Constitutional: Negative for fever and fatigue.  HENT: Negative for congestion, rhinorrhea and postnasal drip.   Eyes: Negative for photophobia and visual disturbance.  Respiratory: Negative for chest tightness, shortness of breath and wheezing.   Cardiovascular: Positive for chest pain. Negative for palpitations and leg swelling.  Gastrointestinal: Positive for nausea. Negative for vomiting, abdominal pain and diarrhea.  Genitourinary: Negative for urgency, frequency and difficulty urinating.  Musculoskeletal: Negative for back pain and arthralgias.  Skin: Negative for rash and wound.  Neurological: Positive for dizziness. Negative for weakness and headaches.  Psychiatric/Behavioral: Negative for confusion and agitation.    Allergies  Review of patient's allergies indicates no known allergies.  Home Medications   Current Outpatient Rx  Name  Route  Sig  Dispense  Refill  . amLODipine (NORVASC) 10 MG tablet   Oral   Take 1 tablet (10 mg total) by mouth daily.   30 tablet   3   . aspirin EC 81 MG tablet   Oral   Take 81 mg by mouth daily.         Marland Kitchen atorvastatin (LIPITOR) 10 MG tablet   Oral   Take 1 tablet (10 mg total) by mouth daily.   30  tablet   3   . cloNIDine (CATAPRES) 0.2 MG tablet   Oral   Take 1 tablet (0.2 mg total) by mouth 2 (two) times daily.   60 tablet   3   . glipiZIDE (GLUCOTROL) 10 MG tablet   Oral   Take 0.5 tablets (5 mg total) by mouth 2 (two) times daily before a meal.   30 tablet   3   . hydrochlorothiazide (HYDRODIURIL) 25 MG tablet   Oral   Take 1 tablet (25 mg total) by mouth daily.   30 tablet   3   . losartan (COZAAR) 100 MG tablet   Oral   Take 50 mg by mouth 2 (two) times daily.         . metFORMIN (GLUCOPHAGE) 1000 MG tablet   Oral   Take 1 tablet (1,000 mg total) by mouth 2 (two) times daily with a meal.   60 tablet   3   .  metoprolol (LOPRESSOR) 100 MG tablet   Oral   Take 0.5 tablets (50 mg total) by mouth 2 (two) times daily.   30 tablet   3   . fexofenadine (ALLEGRA) 180 MG tablet   Oral   Take 180 mg by mouth daily as needed. For seasonal allergies         . triamcinolone (NASACORT AQ) 55 MCG/ACT nasal inhaler   Nasal   Place 1-2 sprays into the nose daily as needed (nasal congestion).            BP 148/84  Pulse 90  Temp(Src) 97.9 F (36.6 C) (Oral)  Resp 20  SpO2 100%  LMP 11/12/2012  Physical Exam  Nursing note and vitals reviewed. Constitutional: She is oriented to person, place, and time. She appears well-developed and well-nourished. No distress.  HENT:  Head: Normocephalic and atraumatic.  Mouth/Throat: Oropharynx is clear and moist.  Eyes: EOM are normal. Pupils are equal, round, and reactive to light.  Neck: Normal range of motion. Neck supple.  Cardiovascular: Normal rate, regular rhythm, normal heart sounds and intact distal pulses.   Pulmonary/Chest: Effort normal and breath sounds normal. She has no wheezes. She has no rales.  Abdominal: Soft. Bowel sounds are normal. She exhibits no distension. There is no tenderness. There is no rebound and no guarding.  Musculoskeletal: Normal range of motion. She exhibits no edema and no tenderness.  Lymphadenopathy:    She has no cervical adenopathy.  Neurological: She is alert and oriented to person, place, and time. She displays normal reflexes. No cranial nerve deficit. She exhibits normal muscle tone. Coordination normal.  Skin: Skin is warm and dry. No rash noted.  Psychiatric: She has a normal mood and affect. Her behavior is normal.    ED Course  Procedures (including critical care time)   Date: 12/14/2012  Rate: 83  Rhythm: normal sinus rhythm  QRS Axis: normal  Intervals: normal  ST/T Wave abnormalities: nonspecific ST/T changes  Conduction Disutrbances:none  Narrative Interpretation:   Old EKG Reviewed: changes  noted TWI inferolateral leads which is new compared to 12/29/11    Labs Reviewed  BASIC METABOLIC PANEL - Abnormal; Notable for the following:    Glucose, Bld 342 (*)    All other components within normal limits  CBC  POCT I-STAT TROPONIN I   Dg Chest 2 View  12/13/2012  *RADIOLOGY REPORT*  Clinical Data: Chest pain and shortness of breath for 3 days. Vaginal bleeding.  History of hypertension.  CHEST - 2  VIEW  Comparison: 12/29/2011  Findings: Borderline heart size with normal pulmonary vascularity. This is likely due to shallow inspiration.  No focal consolidation or airspace disease.  No blunting of costophrenic angles.  No pneumothorax.  Mediastinal contours appear intact.  Tortuous aorta. Degenerative changes in the spine.  IMPRESSION: No evidence of active pulmonary disease.  Mild cardiac enlargement.   Original Report Authenticated By: Burman Nieves, M.D.      1. Hypertension, uncontrolled   2. Chest pain   3. Vaginal bleeding       MDM  72F with pmhx of HTN, DM, HLD, and obesity here with uncontrolled hypertension and intermittent chest pain for the last couple days. She also has some intermittent dizziness and presyncope within the last 2 days. No syncope, seizures, or focal neuro deficits. She also reports 3 weeks of vaginal bleeding. She has had irregular menses for the last year but never with bleeding this long. Exam as noted above. Initial BP 219/110 and improved some to 170s without intervention. EKG with some new TWI. Initial troponin negative. Consulted cardiology due to hypertensive urgency vs ACS.   Cardiology felt EKG changes and symptoms are related to uncontrolled HTN and does not think cardiac cath or stress test is currently warranted. Will admit to family medicine for ACS r/o and BP control.   Pelvic exam revealed dark vaginal bleeding without CMT or uterine/adnexal tenderness. Likely related to perimenopause. Hgb stable.        Johnnette Gourd, MD 12/14/12  [phone]

## 2012-12-13 NOTE — ED Notes (Addendum)
Presents with hypertension, chest pain and 2 weeks of heavy vaginal bleeding. Chest pain started 2-3 days ago rated 7/10 located in epigastric and radiates up into chest and left arm pain is described as heavy and is intermittent, denies pain at this time, denies SOB with pain, denies nausea with pain, C/o Dizziness and feeling "Like my body wants to go limp" reports heavy vaginal bleeding for 2 weeks going through 1/2 pack of pads and tampons per day associated with lower abdominal pain described as cramping, denies large clots, reports small clots.  C/o headache and blurred vision BP 219/110. "My bp usually runs in the 170s, not this high. She has missed three days of her bP medication, but restarted them 2 days ago. Taking clonidine, amlodipine, metoprolol, HCTZ.

## 2012-12-14 ENCOUNTER — Encounter (HOSPITAL_COMMUNITY): Payer: Self-pay | Admitting: *Deleted

## 2012-12-14 DIAGNOSIS — E7800 Pure hypercholesterolemia, unspecified: Secondary | ICD-10-CM

## 2012-12-14 DIAGNOSIS — I10 Essential (primary) hypertension: Secondary | ICD-10-CM

## 2012-12-14 DIAGNOSIS — R079 Chest pain, unspecified: Principal | ICD-10-CM

## 2012-12-14 DIAGNOSIS — E119 Type 2 diabetes mellitus without complications: Secondary | ICD-10-CM

## 2012-12-14 LAB — BASIC METABOLIC PANEL
CO2: 24 mEq/L (ref 19–32)
Calcium: 9.8 mg/dL (ref 8.4–10.5)
Chloride: 100 mEq/L (ref 96–112)
Creatinine, Ser: 0.64 mg/dL (ref 0.50–1.10)
GFR calc non Af Amer: >90 mL/min (ref >90)
Potassium: 3.3 mEq/L — ABNORMAL LOW (ref 3.5–5.1)
Sodium: 135 mEq/L (ref 135–145)

## 2012-12-14 LAB — CBC
MCH: 27.2 pg (ref 26.0–34.0)
MCHC: 33.5 g/dL (ref 30.0–36.0)
Platelets: 300 10*3/uL (ref 150–400)
RDW: 13.8 % (ref 11.5–15.5)

## 2012-12-14 LAB — GLUCOSE, CAPILLARY
Glucose-Capillary: 232 mg/dL — ABNORMAL HIGH (ref 70–99)
Glucose-Capillary: 184 mg/dL — ABNORMAL HIGH (ref 70–99)

## 2012-12-14 MED ORDER — POTASSIUM CHLORIDE CRYS ER 20 MEQ PO TBCR
40.0000 meq | EXTENDED_RELEASE_TABLET | Freq: Once | ORAL | Status: AC
  Administered 2012-12-14: 40 meq via ORAL
  Filled 2012-12-14: qty 2

## 2012-12-14 MED ORDER — HYDROMORPHONE HCL PF 1 MG/ML IJ SOLN: 0.5000 mg | INTRAMUSCULAR | Status: DC | PRN

## 2012-12-14 MED ORDER — CLONIDINE HCL 0.2 MG PO TABS
0.2000 mg | ORAL_TABLET | Freq: Two times a day (BID) | ORAL | Status: DC
  Administered 2012-12-14: 0.2 mg via ORAL
  Filled 2012-12-14 (×2): qty 1

## 2012-12-14 MED ORDER — SODIUM CHLORIDE 0.9 % IJ SOLN
3.0000 mL | Freq: Two times a day (BID) | INTRAMUSCULAR | Status: DC
  Administered 2012-12-14 (×2): 3 mL via INTRAVENOUS

## 2012-12-14 MED ORDER — LOSARTAN POTASSIUM 50 MG PO TABS
100.0000 mg | ORAL_TABLET | Freq: Every day | ORAL | Status: DC
  Administered 2012-12-15: 100 mg via ORAL
  Filled 2012-12-14: qty 2

## 2012-12-14 MED ORDER — ACETAMINOPHEN 325 MG PO TABS: 650.0000 mg | ORAL_TABLET | Freq: Four times a day (QID) | ORAL | Status: DC | PRN

## 2012-12-14 MED ORDER — GLIPIZIDE 5 MG PO TABS
5.0000 mg | ORAL_TABLET | Freq: Two times a day (BID) | ORAL | Status: DC
  Administered 2012-12-14 – 2012-12-15 (×3): 5 mg via ORAL
  Filled 2012-12-14 (×5): qty 1

## 2012-12-14 MED ORDER — AMLODIPINE BESYLATE 10 MG PO TABS
10.0000 mg | ORAL_TABLET | Freq: Every day | ORAL | Status: DC
  Administered 2012-12-14 – 2012-12-15 (×2): 10 mg via ORAL
  Filled 2012-12-14 (×2): qty 1

## 2012-12-14 MED ORDER — CLONIDINE HCL 0.3 MG PO TABS
0.3000 mg | ORAL_TABLET | Freq: Two times a day (BID) | ORAL | Status: DC
  Administered 2012-12-14 – 2012-12-15 (×2): 0.3 mg via ORAL
  Filled 2012-12-14 (×4): qty 1

## 2012-12-14 MED ORDER — LOSARTAN POTASSIUM 50 MG PO TABS
50.0000 mg | ORAL_TABLET | Freq: Two times a day (BID) | ORAL | Status: DC
  Administered 2012-12-14: 50 mg via ORAL
  Filled 2012-12-14 (×2): qty 1

## 2012-12-14 MED ORDER — ZOLPIDEM TARTRATE 5 MG PO TABS: 5.0000 mg | ORAL_TABLET | Freq: Every evening | ORAL | Status: DC | PRN

## 2012-12-14 MED ORDER — ACETAMINOPHEN 650 MG RE SUPP: 650.0000 mg | Freq: Four times a day (QID) | RECTAL | Status: DC | PRN

## 2012-12-14 MED ORDER — OXYCODONE HCL 5 MG PO TABS: 5.0000 mg | ORAL_TABLET | ORAL | Status: DC | PRN

## 2012-12-14 MED ORDER — SODIUM CHLORIDE 0.9 % IV SOLN: INTRAVENOUS | Status: DC

## 2012-12-14 MED ORDER — METOPROLOL TARTRATE 50 MG PO TABS
50.0000 mg | ORAL_TABLET | Freq: Two times a day (BID) | ORAL | Status: DC
  Administered 2012-12-14 – 2012-12-15 (×3): 50 mg via ORAL
  Filled 2012-12-14 (×4): qty 1

## 2012-12-14 MED ORDER — INSULIN ASPART 100 UNIT/ML ~~LOC~~ SOLN
0.0000 [IU] | Freq: Three times a day (TID) | SUBCUTANEOUS | Status: DC
  Administered 2012-12-14: 3 [IU] via SUBCUTANEOUS
  Administered 2012-12-14: 11 [IU] via SUBCUTANEOUS
  Administered 2012-12-15: 8 [IU] via SUBCUTANEOUS
  Administered 2012-12-15: 11 [IU] via SUBCUTANEOUS

## 2012-12-14 MED ORDER — ONDANSETRON HCL 4 MG PO TABS: 4.0000 mg | ORAL_TABLET | Freq: Four times a day (QID) | ORAL | Status: DC | PRN

## 2012-12-14 MED ORDER — ALUM & MAG HYDROXIDE-SIMETH 200-200-20 MG/5ML PO SUSP: 30.0000 mL | Freq: Four times a day (QID) | ORAL | Status: DC | PRN

## 2012-12-14 MED ORDER — SODIUM CHLORIDE 0.9 % IJ SOLN: 3.0000 mL | INTRAMUSCULAR | Status: DC | PRN

## 2012-12-14 MED ORDER — ENOXAPARIN SODIUM 40 MG/0.4ML ~~LOC~~ SOLN
40.0000 mg | SUBCUTANEOUS | Status: DC
  Administered 2012-12-14 – 2012-12-15 (×2): 40 mg via SUBCUTANEOUS
  Filled 2012-12-14 (×2): qty 0.4

## 2012-12-14 MED ORDER — SODIUM CHLORIDE 0.9 % IJ SOLN: 3.0000 mL | Freq: Two times a day (BID) | INTRAMUSCULAR | Status: DC

## 2012-12-14 MED ORDER — SODIUM CHLORIDE 0.9 % IV SOLN: 250.0000 mL | INTRAVENOUS | Status: DC | PRN

## 2012-12-14 MED ORDER — ATORVASTATIN CALCIUM 10 MG PO TABS
10.0000 mg | ORAL_TABLET | Freq: Every day | ORAL | Status: DC
  Administered 2012-12-14 – 2012-12-15 (×2): 10 mg via ORAL
  Filled 2012-12-14 (×2): qty 1

## 2012-12-14 MED ORDER — ONDANSETRON HCL 4 MG/2ML IJ SOLN: 4.0000 mg | Freq: Four times a day (QID) | INTRAMUSCULAR | Status: DC | PRN

## 2012-12-14 NOTE — H&P (Signed)
[facility] History and Physical  Andrea [name] WRU:[id number] DOB: 16-May-1961 DOA: 12/13/2012  Referring physician: EDP PCP:  None Specialists:   Chief Complaint: Chest Pain  HPI: Andrea [name] is a 52 y.o. female with a history of HTN, and DM2, who presents to the ED with complaints of intermittent substernal chest pain radiating into her left arm X 2 days.  She describes the pain as heaviness, and rates the pain at a 6/10, and states that the pain last for 20 minutes at a time.  The pain resolves on its own.  She denies SOB, nausea, vomiting, diaphoresis.  She reports being evaluated once before for similar symptoms and was seen by [facility].   In the ED her initial workup has been negative.     Review of Systems: The patient denies anorexia, fever, weight loss, vision loss, decreased hearing, hoarseness,  syncope, dyspnea on exertion, peripheral edema, balance deficits, hemoptysis, abdominal pain, melena, hematochezia, severe indigestion/heartburn, hematuria, incontinence, muscle weakness, suspicious skin lesions, transient blindness, difficulty walking, depression, unusual weight change, abnormal bleeding, enlarged lymph nodes, angioedema, and breast masses.    Past Medical History  Diagnosis Date  . Hypertension   . Diabetes mellitus   . High cholesterol   . Hyperthyroidism     Radioactive iodine ablation  . Environmental allergies    Past Surgical History  Procedure Laterality Date  . Cesarean section      x2    Medications:  HOME MEDS: Prior to Admission medications   Medication Sig Start Date End Date Taking? Authorizing Provider  amLODipine (NORVASC) 10 MG tablet Take 1 tablet (10 mg total) by mouth daily. 04/22/12  Yes Kathleene Hazel, MD  aspirin EC 81 MG tablet Take 81 mg by mouth daily.   Yes Historical Provider, MD  atorvastatin (LIPITOR) 10 MG tablet Take 1 tablet (10 mg total) by mouth daily. 04/22/12 04/22/13 Yes Kathleene Hazel, MD   cloNIDine (CATAPRES) 0.2 MG tablet Take 1 tablet (0.2 mg total) by mouth 2 (two) times daily. 06/17/12  Yes Phebe Colla, MD  glipiZIDE (GLUCOTROL) 10 MG tablet Take 0.5 tablets (5 mg total) by mouth 2 (two) times daily before a meal. 04/22/12  Yes Kathleene Hazel, MD  hydrochlorothiazide (HYDRODIURIL) 25 MG tablet Take 1 tablet (25 mg total) by mouth daily. 04/22/12  Yes Kathleene Hazel, MD  losartan (COZAAR) 100 MG tablet Take 50 mg by mouth 2 (two) times daily.   Yes Historical Provider, MD  metFORMIN (GLUCOPHAGE) 1000 MG tablet Take 1 tablet (1,000 mg total) by mouth 2 (two) times daily with a meal. 04/22/12  Yes Kathleene Hazel, MD  metoprolol (LOPRESSOR) 100 MG tablet Take 0.5 tablets (50 mg total) by mouth 2 (two) times daily. 04/22/12 04/22/13 Yes Kathleene Hazel, MD  fexofenadine (ALLEGRA) 180 MG tablet Take 180 mg by mouth daily as needed. For seasonal allergies    Historical Provider, MD  triamcinolone (NASACORT AQ) 55 MCG/ACT nasal inhaler Place 1-2 sprays into the nose daily as needed (nasal congestion).     Historical Provider, MD    Allergies:  No Known Allergies  Social History:   reports that she has never smoked. She has never used smokeless tobacco. She reports that she does not drink alcohol or use illicit drugs.  Family History: Family History  Problem Relation Age of Onset  . Pneumonia Mother   . Diabetes Mother   . Hypertension Father   . Hypertension Sister   .  Hyperlipidemia Sister   . Hypertension Brother   . Hypertension Maternal Aunt   . Hypertension Paternal Aunt   . Cancer Paternal Aunt     throat cancer  . Diabetes Maternal Grandmother   . Diabetes Paternal Grandmother   . Cancer Paternal Grandmother     pancreatic cancer     Physical Exam:  GEN:  Pleasant 52 year old Obese African American Female examined  and in no acute distress; cooperative with exam Filed Vitals:   12/13/12 2213 12/13/12 2324 12/14/12 0023 12/14/12  0108  BP: 178/99  148/84 135/76  Pulse:      Temp:      TempSrc:      Resp: 16 20 20 20   SpO2: 97% 100% 100% 98%   Blood pressure 135/76, pulse 90, temperature 97.9 F (36.6 C), temperature source Oral, resp. rate 20, last menstrual period 11/12/2012, SpO2 98.00%. PSYCH: SHe is alert and oriented x4; does not appear anxious does not appear depressed; affect is normal HEENT: Normocephalic and Atraumatic, Mucous membranes pink; PERRLA; EOM intact; Fundi:  Benign;  No scleral icterus, Nares: Patent, Oropharynx: Clear, Fair Dentition, Neck:  FROM, no cervical lymphadenopathy nor thyromegaly or carotid bruit; no JVD; Breasts:: Not examined CHEST WALL: No tenderness CHEST: Normal respiration, clear to auscultation bilaterally HEART: Regular rate and rhythm; no murmurs rubs or gallops BACK: No kyphosis or scoliosis; no CVA tenderness ABDOMEN: Positive Bowel Sounds, Obese, soft non-tender; no masses, no organomegaly, no pannus; no intertriginous candida. Rectal Exam: Not done EXTREMITIES: No bone or joint deformity; age-appropriate arthropathy of the hands and knees; no cyanosis, clubbing or edema; no ulcerations. Genitalia: not examined PULSES: 2+ and symmetric SKIN: Normal hydration no rash or ulceration CNS: Cranial nerves 2-12 grossly intact no focal neurologic deficit    Labs on Admission:  Basic Metabolic Panel:  Recent Labs Lab 12/13/12 2121  NA 135  K 3.5  CL 99  CO2 25  GLUCOSE 342*  BUN 9  CREATININE 0.67  CALCIUM 10.5   Liver Function Tests: No results found for this basename: AST, ALT, ALKPHOS, BILITOT, PROT, ALBUMIN,  in the last 168 hours No results found for this basename: LIPASE, AMYLASE,  in the last 168 hours No results found for this basename: AMMONIA,  in the last 168 hours CBC:  Recent Labs Lab 12/13/12 2121  WBC 7.6  HGB 13.9  HCT 39.6  MCV 81.5  PLT 346   Cardiac Enzymes: No results found for this basename: CKTOTAL, CKMB, CKMBINDEX, TROPONINI,   in the last 168 hours  BNP (last 3 results)  Recent Labs  12/29/11 0340  PROBNP 181.6*   CBG: No results found for this basename: GLUCAP,  in the last 168 hours  Radiological Exams on Admission: Dg Chest 2 View  12/13/2012  *RADIOLOGY REPORT*  Clinical Data: Chest pain and shortness of breath for 3 days. Vaginal bleeding.  History of hypertension.  CHEST - 2 VIEW  Comparison: 12/29/2011  Findings: Borderline heart size with normal pulmonary vascularity. This is likely due to shallow inspiration.  No focal consolidation or airspace disease.  No blunting of costophrenic angles.  No pneumothorax.  Mediastinal contours appear intact.  Tortuous aorta. Degenerative changes in the spine.  IMPRESSION: No evidence of active pulmonary disease.  Mild cardiac enlargement.   Original Report Authenticated By: Burman Nieves, M.D.     EKG: Independently reviewed.  NSR, with Nonspecific changes   Assessment/Plan Principal Problem:  Chest Pain     Active Problems:  DIABETES MELLITUS, TYPE II   Morbid obesity   Essential hypertension, benign   Chest pain   Hypertensive Urgency   Hyperlidemia   1.   Chest Pain-  Admitted to Telemetry Bed for Monitoring, Cycle troponinis q 6 hours X 3.  Nitropaste, O2, and ASA started.  Check Fasting Lipids.    2.  Hypertensive Urgency- transient , resolved in Ed without Medication, initially 220/110 then decreased to 135/76.   PRN Iv Hydralazine.    3.  DM2- SSI coverage fro Elevated Glucose levels, check HbA1c.     4.  Hyperlipdemia- Cehck fasting lipids in AM.    5.  Thyroid disease-  Cehck TSH level  6.  Morbid Obesity- Stable  7.  Other-DVT prophylaxis with Lovenox.      Code Status:   FULL CODE Family Communication:    Husband at Bedside  Disposition Plan:   Return to Home on discharge  Time spent: 62 Minutes  Ron Parker [facility] Pager [phone]  If 7PM-7AM, please contact night-coverage [email or web address] Password  [email or web address] 12/14/2012, 2:10 AM

## 2012-12-14 NOTE — Progress Notes (Signed)
Seen and examined.  We will be picking her up as primary from [facility].  She feels better and has no chest pain.  Admits to poor compliance with diet and meds.  Agree with inpatient stress test as suggested by [facility] cards.  Ruled out for acute MI.  BP control improved.  Patient understands plan.

## 2012-12-14 NOTE — ED Provider Notes (Signed)
I saw and evaluated the patient, reviewed the resident's note and I agree with the findings and plan. I have reviewed EKG and agree with the resident interpretation.  you Patient with 2 issues. She complains of intermittent chest pain concerning for possible ACS however she also has uncontrolled hypertension. Cardiology came and saw the patient and felt that her chest pain was related to uncontrolled hypertension and been told her blood pressure is controlled they would not seek to do intervention. Secondly patient was complaining of vaginal bleeding. There is some dark vaginal blood and some mild adnexal tenderness on exam however no other acute findings. Patient was admitted for cardiac rule out and blood pressure  Gwyneth Sprout, MD 12/14/12 1906

## 2012-12-14 NOTE — Progress Notes (Signed)
Utilization Review Completed.Andrea [name] T2/07/2013  

## 2012-12-14 NOTE — Progress Notes (Addendum)
Patient seen and examined this morning. Admission H&P reviewed. She denies any chest pain at this time. One set of troponin negative on admission. For the troponin has been sent so far. We'll cycle her cardiac enzymes. EKG on admission shows T wave inversion in III, aVF V5 and V6. Noted for hypertensive urgency on presentation patient informs missing out on her blood pressure medications. She does inform seeing her PCP every few months. -She was admitted one year ago with uncontrolled hypertension with some pulmonary edema. She saw [facility] cardiology in June 2013. -Continue telemetry monitoring. We will cycle cardiac enzymes. She is on 4 different blood pressure medications at home. I have started her back on most of her blood pressure medications. -Patient is on metformin and glipizide at home. Holding metformin at this time. Check hemoglobin A1c and lipid panel. -Discussed with [place] cardiology who agree on getting inpatient stress test likely tomorrow if she is ruled out for ACS. -KCl ordered for her hypokalemia. -Counseled on medication compliance, diet and weight control.

## 2012-12-14 NOTE — Progress Notes (Addendum)
Patient ID: TREVIA NOP, female   DOB: 1961/01/14, 52 y.o.   MRN: [id number]   CARDIOLOGY CONSULT NOTE  Patient ID: BRALEE FELDT MRN: [id number], DOB/AGE: Apr 20, 1961   Admit date: 12/13/2012 Date of Consult: 12/14/2012   Primary Physician: Despina Hick, MD Primary Cardiologist: Doylene Bode MD  Pt. Profile  52 year old black female admitted last evening with uncontrolled blood pressure and chest discomfort. We've been asked by Dr. Ermalinda Memos and Dr. Leveda Anna to evaluate for possible underlying coronary disease. Problem List  Past Medical History  Diagnosis Date  . Hypertension   . Diabetes mellitus   . High cholesterol   . Hyperthyroidism     Radioactive iodine ablation  . Environmental allergies     Past Surgical History  Procedure Laterality Date  . Cesarean section      x2     Allergies  No Known Allergies  HPI  Patient is a delightful 52 year old black female admits to repeated noncompliance with medications. She was admitted last year with a hypertensive crisis and pulmonary edema. Since then, she is established in the family medicine clinic with the help of cardiology. She has Medicaid and eventually $3 apiece. She states she gets her meds but forgets to take them. She has 5 members in the household 3 which are children which she is responsible for. 2 of them are grandchildren under the age of 43.  She had chest tightness and pressure for 3 days off and on prior to come the ED last evening. EKG showed new T-wave inversion inferior laterally. Troponin has been negative. Repeat EKG has not been done.  This discomfort occurs in the mid sternum usually at rest. She has not had exertional chest discomfort. She's been very emotionally upset over sister who's been sick and this may have triggered it. She says that when she is upset is when she most likely will have the chest discomfort. She did notice a little radiating into the shoulder and arm.  She had up to 30 minutes  of consistent continuous pain before coming to the ED.  She says her blood pressures never under good control. By this I mean 140/90 despite taking her medications.  She has other risk factors including type 2 diabetes which is not well controlled as well as obesity and hyperlipidemia.    Inpatient Medications  . amLODipine  10 mg Oral Daily  . aspirin  325 mg Oral Daily  . atorvastatin  10 mg Oral Daily  . cloNIDine  0.2 mg Oral BID  . enoxaparin (LOVENOX) injection  40 mg Subcutaneous Q24H  . glipiZIDE  5 mg Oral BID AC  . insulin aspart  0-15 Units Subcutaneous TID WC  . losartan  50 mg Oral BID  . metoprolol  50 mg Oral BID  . sodium chloride  3 mL Intravenous Q12H  . sodium chloride  3 mL Intravenous Q12H    Family History Family History  Problem Relation Age of Onset  . Pneumonia Mother   . Diabetes Mother   . Hypertension Father   . Hypertension Sister   . Hyperlipidemia Sister   . Hypertension Brother   . Hypertension Maternal Aunt   . Hypertension Paternal Aunt   . Cancer Paternal Aunt     throat cancer  . Diabetes Maternal Grandmother   . Diabetes Paternal Grandmother   . Cancer Paternal Grandmother     pancreatic cancer     Social History History   Social History  . Marital Status: Single  Spouse Name: N/A    Number of Children: 3  . Years of Education: N/A   Occupational History  . unemployed    Social History Main Topics  . Smoking status: Never Smoker   . Smokeless tobacco: Never Used  . Alcohol Use: No     Comment: occasional for special occasions  . Drug Use: No  . Sexually Active: Yes    Birth Control/ Protection: Condom   Other Topics Concern  . Not on file   Social History Narrative  . No narrative on file     Review of Systems  General:  No chills, fever, night sweats or weight changes.  Cardiovascular:  dyspnea on exertion, edema, orthopnea, palpitations, paroxysmal nocturnal dyspnea. Dermatological: No rash,  lesions/masses Respiratory: No cough, dyspnea Urologic: No hematuria, dysuria Abdominal:   No nausea, vomiting, diarrhea, bright red blood per rectum, melena, or hematemesis Neurologic:  No visual changes, wkns, changes in mental status. All other systems reviewed and are otherwise negative except as noted above.  Physical Exam  Blood pressure 170/95, pulse 71, temperature 98.1 F (36.7 C), temperature source Oral, resp. rate 18, height 5\' 5"  (1.651 m), weight 283 lb 4.8 oz (128.504 kg), last menstrual period 11/12/2012, SpO2 97.00%.  General: Pleasant, NAD, markedly overweight Psych: Normal affect. Neuro: Alert and oriented X 3. Moves all extremities spontaneously. HEENT: Normal  Neck: Supple without bruits or JVD. Lungs:  Resp regular and unlabored, CTA. Heart: RRR no s3, s4, or murmurs. Abdomen: Soft, non-tender, non-distended, BS + x 4.  Extremities: No clubbing, cyanosis or edema. DP/PT/Radials 2+ and equal bilaterally.  Labs   Recent Labs  12/14/12 0915  TROPONINI <0.30   Lab Results  Component Value Date   WBC 6.6 12/14/2012   HGB 11.9* 12/14/2012   HCT 35.5* 12/14/2012   MCV 81.2 12/14/2012   PLT 300 12/14/2012    Recent Labs Lab 12/14/12 0510  NA 135  K 3.3*  CL 100  CO2 24  BUN 10  CREATININE 0.64  CALCIUM 9.8  GLUCOSE 284*   Lab Results  Component Value Date   CHOL 229* 05/10/2010   HDL 48 05/10/2010   LDLCALC 161* 05/10/2010   TRIG 102 05/10/2010   Lab Results  Component Value Date   DDIMER 1.41* 12/29/2011    Radiology/Studies  Dg Chest 2 View  12/13/2012  *RADIOLOGY REPORT*  Clinical Data: Chest pain and shortness of breath for 3 days. Vaginal bleeding.  History of hypertension.  CHEST - 2 VIEW  Comparison: 12/29/2011  Findings: Borderline heart size with normal pulmonary vascularity. This is likely due to shallow inspiration.  No focal consolidation or airspace disease.  No blunting of costophrenic angles.  No pneumothorax.  Mediastinal contours appear  intact.  Tortuous aorta. Degenerative changes in the spine.  IMPRESSION: No evidence of active pulmonary disease.  Mild cardiac enlargement.   Original Report Authenticated By: Burman Nieves, M.D.     ECG Normal sinus rhythm with ST-T wave changes inferiorly and laterally during her hypertensive crisis.  ASSESSMENT AND PLAN  Mrs. Wickham most likely has some underlying coronary disease considering her age, poorly controlled diabetes and hypertension, not to mention hyperlipidemia as well as obesity. I suspect her chest discomfort on this admission was to call some demand ischemia from poorly controlled blood pressure and hypertensive crisis.   Before proceeding with any further evaluation of her heart, I would recommend full compliance with medications and diet. I've had a long talk with her about this  today. I've also made some adjustments in her blood pressure meds increasing her clonidine to 0.3 twice a day and also increasing losartan to 100 mg daily. She is to follow a strict low-sodium diet. As the long time talking to her about stress management. It would  be good she began a walking program. She can be discharged once her blood pressures under good control. Would not perform a stress test at this point. We'll repeat EKG in the morning once blood pressure under better control to see if her EKG changes have improved. I'll arrange a followup in the cardiology office post discharge.   Signed, Valera Castle, MD 12/14/2012, 11:54 AM

## 2012-12-14 NOTE — Progress Notes (Signed)
Patient's PCP is Dr Elwyn Reach with family medicine residency program and follows with her . i have spoken to the resident Dr Ermalinda Memos and patient will be transferred to their service.

## 2012-12-14 NOTE — Progress Notes (Signed)
Family Medicine Teaching [facility] Transfer Note Service Pager: [phone]  Patient name: Andrea [name] Medical record number: [id number] Date of birth: [birth year] Age: 52 y.o. Gender: female  Primary Care Provider: BOOTH, Denny Peon, MD  Chief Complaint: Chest pain, hypertension  History of Present Illness: Andrea [name] is a 52 y.o. year old female presenting with chest pain for 2 days. Describes chest pain as intermittent substernal heaviness coming on while sitting with no apparent exacerbating factors and spontaneously resolving after 20 minutes. She states that since admitted she has not had anymore chest pain and still denies dyspnea. She has not been compliant with her meds because she is busy taking care of everyone else and wants to follow closer with her PCP. On presentation to the ED, her BP was 219/110 and improved on recheck without meds. She denies nausea, vomiting, diaphoresis, and SOB.   She has been evaluated once before by [facility] cardiology for similar symptoms.   She was admitted by [facility] hospitalists after midnight today 12/14/12, and is being transferred to our care as she has been seen in our clinic. To this point she has had a negative work up including troponin and CXR, and is now being followed also by cardiology.   Patient Active Problem List  Diagnosis  . THYROMEGALY  . THYROID NODULE  . DIABETES MELLITUS, TYPE II  . ANXIETY  . DEPRESSION  . Essential hypertension, benign  . ALLERGIC RHINITIS, SEASONAL  . GERD  . CHEST PAIN UNSPECIFIED  . GOITER, TOXIC, MULTINODULAR  . DENTAL PAIN  . High cholesterol  . Morbid obesity  . Chest pain   Past Medical History: Past Medical History  Diagnosis Date  . Hypertension   . Diabetes mellitus   . High cholesterol   . Hyperthyroidism     Radioactive iodine ablation  . Environmental allergies    Past Surgical History: Past Surgical History  Procedure Laterality Date  . Cesarean section      x2   Social  History: History  Substance Use Topics  . Smoking status: Never Smoker   . Smokeless tobacco: Never Used  . Alcohol Use: No     Comment: occasional for special occasions   For any additional social history documentation, please refer to relevant sections of EMR.  Family History: Family History  Problem Relation Age of Onset  . Pneumonia Mother   . Diabetes Mother   . Hypertension Father   . Hypertension Sister   . Hyperlipidemia Sister   . Hypertension Brother   . Hypertension Maternal Aunt   . Hypertension Paternal Aunt   . Cancer Paternal Aunt     throat cancer  . Diabetes Maternal Grandmother   . Diabetes Paternal Grandmother   . Cancer Paternal Grandmother     pancreatic cancer   Allergies: No Known Allergies No current facility-administered medications on file prior to encounter.   Current Outpatient Prescriptions on File Prior to Encounter  Medication Sig Dispense Refill  . amLODipine (NORVASC) 10 MG tablet Take 1 tablet (10 mg total) by mouth daily.  30 tablet  3  . aspirin EC 81 MG tablet Take 81 mg by mouth daily.      Marland Kitchen atorvastatin (LIPITOR) 10 MG tablet Take 1 tablet (10 mg total) by mouth daily.  30 tablet  3  . cloNIDine (CATAPRES) 0.2 MG tablet Take 1 tablet (0.2 mg total) by mouth 2 (two) times daily.  60 tablet  3  . glipiZIDE (GLUCOTROL) 10 MG tablet Take  0.5 tablets (5 mg total) by mouth 2 (two) times daily before a meal.  30 tablet  3  . hydrochlorothiazide (HYDRODIURIL) 25 MG tablet Take 1 tablet (25 mg total) by mouth daily.  30 tablet  3  . metFORMIN (GLUCOPHAGE) 1000 MG tablet Take 1 tablet (1,000 mg total) by mouth 2 (two) times daily with a meal.  60 tablet  3  . metoprolol (LOPRESSOR) 100 MG tablet Take 0.5 tablets (50 mg total) by mouth 2 (two) times daily.  30 tablet  3  . fexofenadine (ALLEGRA) 180 MG tablet Take 180 mg by mouth daily as needed. For seasonal allergies      . triamcinolone (NASACORT AQ) 55 MCG/ACT nasal inhaler Place 1-2 sprays  into the nose daily as needed (nasal congestion).       . [DISCONTINUED] sertraline (ZOLOFT) 50 MG tablet Take 50 mg by mouth daily.       Review Of Systems: Per HPI, otherwise 12 point review of systems was performed and was unremarkable.  Physical Exam: BP 170/95  Pulse 71  Temp(Src) 98.1 F (36.7 C) (Oral)  Resp 18  Ht 5\' 5"  (1.651 m)  Wt 283 lb 4.8 oz (128.504 kg)  BMI 47.14 kg/m2  SpO2 97%  LMP 11/12/2012 Exam: Gen: NAD, alert, cooperative with exam HEENT: NCAT, EOMI, PERRL CV: RRR, good S1/S2, no murmur Resp: very soft crackles at BL bases otherwise clear and unlabored, no wheezes Abd: SNTND, BS present, no guarding or organomegaly Ext: No edema, warm Neuro: Alert and oriented, No gross deficits   Labs and Imaging: CBC BMET   Recent Labs Lab 12/14/12 0510  WBC 6.6  HGB 11.9*  HCT 35.5*  PLT 300    Recent Labs Lab 12/14/12 0510  NA 135  K 3.3*  CL 100  CO2 24  BUN 10  CREATININE 0.64  GLUCOSE 284*  CALCIUM 9.8       Recent Labs Lab 12/14/12 0915  TROPONINI <0.30   EKG: Regular rate, rhythm, and axis. Incomplete ventricular conduction delay, inverted T wave in inferior leads.   CXR 12/13/2012 IMPRESSION:  No evidence of active pulmonary disease. Mild cardiac enlargement   Assessment and Plan: Andrea [name] is a 52 y.o. year old female with PMH of HTN, HLD, and DM2 presenting with chest pain for 2 days, and hypertensive urgency. The pain has  typical and atypical features, she has risk factors for ACS and she has new changes on her EKG. She was initially admitted by [facility] and is being transferred to our care as she has been seen in our clinic before.   Chest pain-currently pain free - Typical and atypical features, Hemodynamically stable, T wave inversions on EKG, Troponin negative X 1.  - [place] cards contacted by [facility] prior to transfer  - Appreciate their reccs- optimize HTN meds, f/u EKG in the am and OP f/u. - Risk  stratification labs  - Lipids and TSH tomorrow at 0500  - A1C - pending - Nitropaste, O2, ASA, PRN hydrocodone  HTN - 170/95 now, prior 219/110 - Continue home meds: Amlodipine, clonidine, HCTZ, losartan, metoprolol - will monitor closely and consider increasing dosage/adding meds if continued. Looks like all of her meds were given at 10 am this morning.   DM2 - CBGs 232-303 - A1C - pending - Continue glipizide 5 PO BID and SSI  HLD - Lipids tomorrow am - Continue lipitor 10 mg daily  FEN/GI: carb modified diet Prophylaxis: Lovenox Disposition: Tele, home  when ruled out and has a cardiac evaluation Code Status: Full    Kevin Fenton, MD 12/14/2012, 11:22 AM  I have seen and examined patient with PGY-1 Dr. Ermalinda Memos and I agree with above, changes made as noted.  Lloyd Huger, MD [facility] Resident - PGY-3 12/14/2012 8:10 PM

## 2012-12-15 LAB — BASIC METABOLIC PANEL
Chloride: 100 mEq/L (ref 96–112)
GFR calc Af Amer: >90 mL/min (ref >90)
Potassium: 3.8 mEq/L (ref 3.5–5.1)
Sodium: 134 mEq/L — ABNORMAL LOW (ref 135–145)

## 2012-12-15 LAB — GLUCOSE, CAPILLARY: Glucose-Capillary: 306 mg/dL — ABNORMAL HIGH (ref 70–99)

## 2012-12-15 LAB — LIPID PANEL
Cholesterol: 173 mg/dL (ref 0–200)
HDL: 49 mg/dL (ref >39)
Total CHOL/HDL Ratio: 3.5 RATIO
Triglycerides: 163 mg/dL — ABNORMAL HIGH (ref <150)

## 2012-12-15 LAB — CBC
HCT: 35.1 % — ABNORMAL LOW (ref 36.0–46.0)
Hemoglobin: 11.9 g/dL — ABNORMAL LOW (ref 12.0–15.0)
RBC: 4.33 MIL/uL (ref 3.87–5.11)
WBC: 5.5 10*3/uL (ref 4.0–10.5)

## 2012-12-15 MED ORDER — ATORVASTATIN CALCIUM 40 MG PO TABS: 40.0000 mg | ORAL_TABLET | Freq: Every day | ORAL | Status: DC

## 2012-12-15 MED ORDER — CLONIDINE HCL 0.3 MG PO TABS: 0.3000 mg | ORAL_TABLET | Freq: Two times a day (BID) | ORAL | Status: DC

## 2012-12-15 MED ORDER — GLIPIZIDE 10 MG PO TABS
10.0000 mg | ORAL_TABLET | Freq: Two times a day (BID) | ORAL | Status: DC
  Filled 2012-12-15 (×2): qty 1

## 2012-12-15 MED ORDER — GLIPIZIDE 10 MG PO TABS: 10.0000 mg | ORAL_TABLET | Freq: Two times a day (BID) | ORAL | Status: DC

## 2012-12-15 NOTE — Progress Notes (Signed)
Patient Name: Andrea [name] Date of Encounter: 12/15/2012  Active Problems:   DIABETES MELLITUS, TYPE II   Essential hypertension, benign   Morbid obesity   Chest pain    SUBJECTIVE: No chest pain, no SOB. Aware of need to control DM/HTN. Agrees to f/u in office.  OBJECTIVE Filed Vitals:   12/14/12 1013 12/14/12 1019 12/14/12 1650 12/15/12 0600  BP: 170/95 170/95 137/78 145/90  Pulse: 71 71 58 61  Temp:  98.1 F (36.7 C) 97.3 F (36.3 C) 97.9 F (36.6 C)  TempSrc:  Oral Oral   Resp:  18 20 16   Height:      Weight:      SpO2:  97% 100% 97%   No intake or output data in the 24 hours ending 12/15/12 0815 Filed Weights   12/14/12 0330  Weight: 283 lb 4.8 oz (128.504 kg)     PHYSICAL EXAM General: Well developed, well nourished, female in no acute distress. Head: Normocephalic, atraumatic.  Neck: Supple without bruits, JVD not elevated. Lungs:  Resp regular and unlabored, CTA bilaterally.  Heart: RRR, S1, S2, no S3, S4, or murmur. Abdomen: Soft, non-tender, non-distended, BS + x 4.  Extremities: No clubbing, cyanosis, no edema.  Neuro: Alert and oriented X 3. Moves all extremities spontaneously. Psych: Normal affect.  LABS: CBC: Recent Labs  12/13/12 2121 12/14/12 0510  WBC 7.6 6.6  HGB 13.9 11.9*  HCT 39.6 35.5*  MCV 81.5 81.2  PLT 346 300   Basic Metabolic Panel: Recent Labs  12/13/12 2121 12/14/12 0510  NA 135 135  K 3.5 3.3*  CL 99 100  CO2 25 24  GLUCOSE 342* 284*  BUN 9 10  CREATININE 0.67 0.64  CALCIUM 10.5 9.8   Cardiac Enzymes: Recent Labs  12/14/12 0915 12/14/12 1410 12/14/12 1949  TROPONINI <0.30 <0.30 <0.30    Recent Labs  12/13/12 2131  TROPIPOC 0.01    Hemoglobin A1C: Recent Labs  12/14/12 0915  HGBA1C 15.1*   Fasting Lipid Panel: Recent Labs  12/15/12 0420  CHOL 173  HDL 49  LDLCALC 91  TRIG 163*  CHOLHDL 3.5   Thyroid Function Tests: TSH pending Lab Results  Component Value Date   TSH 1.128  12/29/2011   Lab Results  Component Value Date   HGBA1C 15.1* 12/14/2012   TELE:  Mena Pauls mostly, SR, rare PVCs    ECG: Repeat ECG pending 13-Dec-2012 20:53:00  Normal sinus rhythm Left ventricular hypertrophy with repolarization abnormality Cannot rule out Inferior infarct , age undetermined Abnormal ECG Vent. rate 83 BPM PR interval 150 ms QRS duration 106 ms QT/QTc 382/448 ms P-R-T axes 58 34 -6  Radiology/Studies: Dg Chest 2 View 12/13/2012  *RADIOLOGY REPORT*  Clinical Data: Chest pain and shortness of breath for 3 days. Vaginal bleeding.  History of hypertension.  CHEST - 2 VIEW  Comparison: 12/29/2011  Findings: Borderline heart size with normal pulmonary vascularity. This is likely due to shallow inspiration.  No focal consolidation or airspace disease.  No blunting of costophrenic angles.  No pneumothorax.  Mediastinal contours appear intact.  Tortuous aorta. Degenerative changes in the spine.  IMPRESSION: No evidence of active pulmonary disease.  Mild cardiac enlargement.   Original Report Authenticated By: Burman Nieves, M.D.    Current Medications:  . amLODipine  10 mg Oral Daily  . aspirin  325 mg Oral Daily  . atorvastatin  10 mg Oral Daily  . cloNIDine  0.3 mg Oral BID  . enoxaparin (LOVENOX)  injection  40 mg Subcutaneous Q24H  . glipiZIDE  5 mg Oral BID AC  . insulin aspart  0-15 Units Subcutaneous TID WC  . losartan  100 mg Oral Daily  . metoprolol  50 mg Oral BID  . sodium chloride  3 mL Intravenous Q12H  . sodium chloride  3 mL Intravenous Q12H   . sodium chloride      ASSESSMENT AND PLAN:   Chest pain -  per TW note 2-09: Mrs. Haegele most likely has some underlying coronary disease considering her age, poorly controlled diabetes and hypertension, not to mention hyperlipidemia as well as obesity. I suspect her chest discomfort on this admission was due to some demand ischemia from poorly controlled blood pressure and hypertensive crisis.   Before  proceeding with any further evaluation of her heart, I would recommend full compliance with medications and diet. I've had a long talk with her about this today. I've also made some adjustments in her blood pressure meds, increasing her clonidine to 0.3 twice a day and also increasing losartan to 100 mg daily. She is to follow a strict low-sodium diabetic diet. I spent a long time talking to her about stress management. It would be good if she began a walking program. She can be discharged once her blood pressure is under better control. Would not perform a stress test at this point. We'll repeat EKG in the morning once blood pressure under better control to see if her EKG changes have improved. I'll arrange a followup in the cardiology office post discharge. _________________________________________________  ECG was initially abnl but all enzymes were negative and symptoms improved with better BP/blood sugar control. No echo ordered or in system, MD advise on echo as in-pt or as out-pt. Have made f/u appt per TW note. Repeat ECG pending.  Otherwise, per primary MD. Active Problems:   DIABETES MELLITUS, TYPE II   Essential hypertension, benign   Morbid obesity    Signed, Theodore Demark , PA-C 8:15 AM 12/15/2012

## 2012-12-15 NOTE — Care Management Note (Signed)
    Page 1 of 1   12/15/2012     3:16:54 PM   CARE MANAGEMENT NOTE 12/15/2012  Patient:  Andrea [name], Andrea [name]   Account Number:  [id number]  Date Initiated:  12/15/2012  Documentation initiated by:  GRAVES-BIGELOW,Elloise Roark  Subjective/Objective Assessment:   Pt admitted with cp. Plan for d/c today.     Action/Plan:   CM did discuss with pt about medications and noncompliance. CM did call Pt's pharmacy to see if they can waive the cost and they are unable to do so. Pt has a f/u appointment at the clinic on Fri. to get set up with insulin.   Anticipated DC Date:  12/15/2012   Anticipated DC Plan:  HOME/SELF CARE      DC Planning Services  CM consult      Choice offered to / List presented to:             Status of service:  Completed, signed off Medicare Important Message given?   (If response is "NO", the following Medicare IM given date fields will be blank) Date Medicare IM given:   Date Additional Medicare IM given:    Discharge Disposition:    Per UR Regulation:  Reviewed for med. necessity/level of care/duration of stay  If discussed at Long Length of Stay Meetings, dates discussed:    Comments:  No further needs from CM at this time.

## 2012-12-15 NOTE — Progress Notes (Signed)
Family Medicine Teaching Service Daily Progress Note Service Page: [phone]  Subjective:  Feels well this am, did not sleep well in the hospital. Denies chest pain and dyspnea. Confirms that she has been non-compliant and that she understands she needs to start taking her meds.   Objective: Temp:  [97.3 F (36.3 C)-98.1 F (36.7 C)] 97.9 F (36.6 C) (02/10 0600) Pulse Rate:  [58-71] 61 (02/10 0600) Resp:  [16-20] 16 (02/10 0600) BP: (137-170)/(78-95) 145/90 mmHg (02/10 0600) SpO2:  [97 %-100 %] 97 % (02/10 0600) Exam: Gen: NAD, alert, cooperative with exam , lying in bed HEENT: NCAT  CV: RRR, good S1/S2, no murmur  Resp: CTAB, no wheezes, non labored Abd: SNTND, BS present, no guarding or organomegaly  Ext: No edema, warm  Neuro: Alert and oriented, No gross deficits   I have reviewed the patient's medications, labs, imaging, and diagnostic testing.  Notable results are summarized below.  CBC BMET   Recent Labs Lab 12/13/12 2121 12/14/12 0510  WBC 7.6 6.6  HGB 13.9 11.9*  HCT 39.6 35.5*  PLT 346 300    Recent Labs Lab 12/13/12 2121 12/14/12 0510  NA 135 135  K 3.5 3.3*  CL 99 100  CO2 25 24  BUN 9 10  CREATININE 0.67 0.64  GLUCOSE 342* 284*  CALCIUM 10.5 9.8       Recent Labs Lab 12/14/12 0915 12/14/12 1410 12/14/12 1949  TROPONINI <0.30 <0.30 <0.30   Scheduled Meds: . amLODipine  10 mg Oral Daily  . aspirin  325 mg Oral Daily  . atorvastatin  10 mg Oral Daily  . cloNIDine  0.3 mg Oral BID  . enoxaparin (LOVENOX) injection  40 mg Subcutaneous Q24H  . glipiZIDE  5 mg Oral BID AC  . insulin aspart  0-15 Units Subcutaneous TID WC  . losartan  100 mg Oral Daily  . metoprolol  50 mg Oral BID  . sodium chloride  3 mL Intravenous Q12H  . sodium chloride  3 mL Intravenous Q12H   Continuous Infusions: . sodium chloride     PRN Meds:.sodium chloride, acetaminophen, acetaminophen, alum & mag hydroxide-simeth, HYDROmorphone (DILAUDID) injection,  ondansetron (ZOFRAN) IV, ondansetron, oxyCODONE, sodium chloride, zolpidem  Imaging/Diagnostic Tests:  EKG: Regular rate, rhythm, and axis. Incomplete ventricular conduction delay, inverted T wave in inferior leads.   CXR 12/13/2012  IMPRESSION:  No evidence of active pulmonary disease. Mild cardiac enlargement   Plan: Andrea [name] is a 52 y.o. year old female with PMH of HTN, HLD, and DM2 presenting with chest pain for 2 days, and hypertensive urgency.    Chest pain - Resolved  - Typical and atypical features, Hemodynamically stable, T wave inversions on EKG, Troponin negative X 1.  - [place] cards consulting- appreciate reccs  - optimize HTN meds,   - f/u EKG this am - Risk stratification labs   - Lipids : TChol 173, Trg 163, HDL 49, LDL 91  - TSH pending  - A1C 15.1 - Nitropaste, O2, ASA, PRN hydrocodone  - Repeat EKG pending  Hypertensive urgency - 145/90 now,  219/110  At pres to ED - Meds optimized per cardiology: Amlodipine, clonidine increased to 0.3 BID, HCTZ, losartan increased to 100 daily, metoprolol  - Non compliant at home  DM2  - CBGs 184-303 - A1C 15.1 - increase  Glipizide to 10 PO BID and increase metformin to 1000 BID - SSI and glipizide currently  HLD  - LDL 91, TChol 173, Trg 163 -  Consider increasing statin to 40 daily based on risk factors for CVD and stroke  FEN/GI: carb modified diet  Prophylaxis: Lovenox  Disposition: Tele, home when ruled out and has a cardiac evaluation  Code Status: Full    Kevin Fenton, MD 12/15/2012, 7:11 AM

## 2012-12-15 NOTE — Progress Notes (Signed)
[facility] Attending Daily Note: Andrea Kiraly MD [phone] pager office [phone] I have discussed this patient with the resident and reviewed the assessment and plan as documented above. I agree wit the resident's findings and plan.  

## 2012-12-15 NOTE — Progress Notes (Signed)
Inpatient Diabetes Program Recommendations  AACE/ADA: New Consensus Statement on Inpatient Glycemic Control (2013)  Target Ranges:  Prepandial:   less than 140 mg/dL      Peak postprandial:   less than 180 mg/dL (1-2 hours)      Critically ill patients:  140 - 180 mg/dL   Reason for Visit: Sub-optimal glycemic control and elevated Hgb A1C  Inpatient Diabetes Program Recommendations Insulin - Basal: Request MD consider starting basal insulin, Lantus 30 units either daily or at HS. (Approx. 0.25 units/kg) Oral Agents: Glucotrol 10 mg bid-- Increased today HgbA1C: 15.1-- indicating a mean glucose of 387 mg/dl Outpatient Referral: Followed in the [facility].  Recommend outpatient diabetes education follow-up in the clinic   Note:  Results for Andrea [name], Andrea [name] (MRN [id number]) as of 12/15/2012 11:07  Ref. Range 12/14/2012 04:02 12/14/2012 11:31 12/14/2012 16:58 12/14/2012 21:18 12/15/2012 07:17  Glucose-Capillary Latest Range: 70-99 mg/dL 272 (H) 536 (H) 644 (H) 281 (H) 306 (H)   Patient has been non-compliant with medication regimen per MD notes.  However, with Hgb A1C so high, doubt patient can be controlled adequately with oral agents.  Thank you. Illyana Schorsch S. Elsie Lincoln, RN, CNS, CDE Inpatient Diabetes Program, team pager [phone]

## 2012-12-15 NOTE — Progress Notes (Signed)
Family Medicine Teaching [facility] Discharge Summary  Patient name: FLOELLA ENSZ Medical record number: [id number] Date of birth: 12-Jan-1961 Age: 52 y.o. Gender: female Date of Admission: 12/13/2012  Date of Discharge: 12/15/2012 Admitting Physician: Ron Parker, MD  Primary Care Provider: BOOTH, Denny Peon, MD  Indication for Hospitalization: Chest pain, hypertensive urgency Discharge Diagnoses:  1. Chest pain 2. Hypertensive Urgency 3. DM2 4. HLD  Consultations: [facility] (Dr. Daleen Squibb)  Significant Labs and Imaging:    Recent Labs Lab 12/14/12 0915 12/14/12 1410 12/14/12 1949  TROPONINI <0.30 <0.30 <0.30    Recent Labs Lab 12/13/12 2121 12/14/12 0510 12/15/12 1025  HGB 13.9 11.9* 11.9*  HCT 39.6 35.5* 35.1*  WBC 7.6 6.6 5.5  PLT 346 300 282    Recent Labs Lab 12/13/12 2121 12/14/12 0510 12/15/12 1025  NA 135 135 134*  K 3.5 3.3* 3.8  CL 99 100 100  CO2 25 24 24   GLUCOSE 342* 284* 324*  BUN 9 10 11   CREATININE 0.67 0.64 0.66  CALCIUM 10.5 9.8 10.1     12/15/2012 04:20  Cholesterol 173  Triglycerides 163 (H)  HDL 49  LDL (calc) 91  VLDL 33  Total CHOL/HDL Ratio 3.5   TSH: 0.776 A1C 15.1  CXR 12/13/2012  IMPRESSION:  No evidence of active pulmonary disease. Mild cardiac enlargement  Procedures: None  Brief Hospital Course:  ANNETA ROUNDS is a 52 y.o. year old female with PMH of HTN, HLD, and DM2 presenting with chest pain for 2 days, and hypertensive urgency. She described her pain as intermittent substernal heaviness coming on while sitting with no apparent exacerbating factors and spontaneously resolving after 20 minutes.  Chest pain  Her chest pain had typical and atypical features and resolved after admission with better control of her blood pressure. [facility] was consulted and optimized her BP meds as described below and requested OP follow up. Her follow up EKG showed some improvement in her T wave inversions and  her troponins were negative times three. Risk stratification labs were collected and are listed above.    Hypertensive urgency  Her initial BP upon presentation to the ED was 219/110 at presentation to the ED. Her BP was 145/90 at discharge. She was by history very non-compliant. Her home BP meds were restarted and adjusted per cardiology. Increased Clonidine and losartan and continued home dose of amlodipine, HCTZ, and metoprolol.   DM2  Her A1C ws checked and found to be 15.1. She was counseled on diet, exercise, and medication comliance. We felt she'd benefit the most by starting lantus with the assistance of our family medicine pharmacist, Paulino Rily, and scheduled her a close follow up with him. Her glipizide was increased to 10 BID and metformin continued at dc. We held metformin whil IP and addeda a SSI.   HLD  Her fasting lipid panel was collected and we increased her to atorvastatin 40 mg daily just according to her risk factors of uncontrolled DM2 and HTN.   Dispo: Home with f/u with Cardiology, her PCP and Family med pharmacy clinic  Discharge Medications:    Medication List    TAKE these medications       amLODipine 10 MG tablet  Commonly known as:  NORVASC  Take 1 tablet (10 mg total) by mouth daily.     aspirin EC 81 MG tablet  Take 81 mg by mouth daily.     atorvastatin 40 MG tablet  Commonly known as:  LIPITOR  Take 1 tablet (40 mg total) by mouth daily.  Start taking on:  12/16/2012     cloNIDine 0.3 MG tablet  Commonly known as:  CATAPRES  Take 1 tablet (0.3 mg total) by mouth 2 (two) times daily.     fexofenadine 180 MG tablet  Commonly known as:  ALLEGRA  Take 180 mg by mouth daily as needed. For seasonal allergies     glipiZIDE 10 MG tablet  Commonly known as:  GLUCOTROL  Take 1 tablet (10 mg total) by mouth 2 (two) times daily before a meal.     hydrochlorothiazide 25 MG tablet  Commonly known as:  HYDRODIURIL  Take 1 tablet (25 mg total) by mouth  daily.     losartan 100 MG tablet  Commonly known as:  COZAAR  Take 50 mg by mouth 2 (two) times daily.     metFORMIN 1000 MG tablet  Commonly known as:  GLUCOPHAGE  Take 1 tablet (1,000 mg total) by mouth 2 (two) times daily with a meal.     metoprolol 100 MG tablet  Commonly known as:  LOPRESSOR  Take 0.5 tablets (50 mg total) by mouth 2 (two) times daily.     NASACORT AQ 55 MCG/ACT nasal inhaler  Generic drug:  triamcinolone  Place 1-2 sprays into the nose daily as needed (nasal congestion).       Issues for Follow Up:  - Compliance- patient was counseled extensively on the importance of taking her meds, we suggested a pill box to help her keep track of her meds - Pharmacy clinic- She is scheduled for counseling to start Lantus as her A1C is 15 - Monitor HTN- if she is compliant thia may be well managed with current meds  Outstanding Results: None  Discharge Instructions: Please refer to Patient Instructions section of EMR for full details.  Patient was counseled important signs and symptoms that should prompt return to medical care, changes in medications, dietary instructions, activity restrictions, and follow up appointments.       Follow-up Information   Follow up with Tereso Newcomer, PA On 12/23/2012. (See for Dr Clifton James at 10:10 am.)    Contact information:   [address] [place] [address] [phone]       Follow up with Despina Hick, MD On 01/01/2013. (at 2:15)    Contact information:   [address] [place] [address] [phone]       Follow up with Kathrin Ruddy, PHARMD On 12/19/2012. (11:15)    Contact information:   [address] [place] [address] [phone]       Discharge Condition: Carlena Sax, MD 12/15/2012, 1:54 PM

## 2012-12-15 NOTE — Progress Notes (Signed)
The patient was not seen by me as she was discharged.

## 2012-12-16 NOTE — Progress Notes (Signed)
Family Medicine Teaching Service  Discharge Note : Attending Danial Hlavac MD Pager [phone] Office [phone] I have seen and examined this patient, reviewed their chart and discussed discharge planning wit the resident at the time of discharge. I agree with the discharge plan as above.  

## 2012-12-17 ENCOUNTER — Ambulatory Visit (INDEPENDENT_AMBULATORY_CARE_PROVIDER_SITE_OTHER): Payer: Self-pay | Admitting: Family Medicine

## 2012-12-17 ENCOUNTER — Encounter: Payer: Self-pay | Admitting: Family Medicine

## 2012-12-17 VITALS — BP 172/92 | HR 68 | Temp 97.8°F | Ht 65.0 in | Wt 285.0 lb

## 2012-12-17 DIAGNOSIS — R42 Dizziness and giddiness: Secondary | ICD-10-CM

## 2012-12-17 DIAGNOSIS — E119 Type 2 diabetes mellitus without complications: Secondary | ICD-10-CM

## 2012-12-17 LAB — GLUCOSE, CAPILLARY: Glucose-Capillary: 238 mg/dL — ABNORMAL HIGH (ref 70–99)

## 2012-12-17 NOTE — Assessment & Plan Note (Signed)
Likely secondary to uncontrolled HTN. Improved in clinic from home measurement, and improved from highest in hospital was >200/110. No sign of urgency or end organ damage (neuro or cardiac) currently. Discussed importance of med compliance again, especially with clonidine, which she endorses perfect compliance. She missed HCTZ, but I have advised her to take this in the am to avoid nocturia, and to take it immediately upon returning home. She has all her medications at home, and no other barriers are identified. She will f/u in pharmacy clinic in 2 days for recheck. Discussed red flags such as stroke symptoms, chest pain or visual loss to seek emergency care if develops. Hospital f/u with PCP next week scheduled. Would consider increase in lopressor or addition of hydralazine if still above goal. May also be a candidate for sleep apnea testing in the future.

## 2012-12-17 NOTE — Patient Instructions (Addendum)
NIce to see you. I think your symptoms are from high blood pressure. Take your Hydrochlorothiazide when you get home. See Dr. Raymondo Band on Friday for diabetes. If your blood pressure remains over 190/100, then call the doctor because you may need another medication. Possible you have sleep apnea which could make blood pressure worse. If you have one sided weakness, chest pain, lose vision, severe headache then go to the ER.  Hypertension Hypertension is another name for high blood pressure. High blood pressure may mean that your heart needs to work harder to pump blood. Blood pressure consists of two numbers, which includes a higher number over a lower number (example: 110/72). HOME CARE   Make lifestyle changes as told by your doctor. This may include weight loss and exercise.  Take your blood pressure medicine every day.  Limit how much salt you use.  Stop smoking if you smoke.  Do not use drugs.  Talk to your doctor if you are using decongestants or birth control pills. These medicines might make blood pressure higher.  Females should not drink more than 1 alcoholic drink per day. Males should not drink more than 2 alcoholic drinks per day.  See your doctor as told. GET HELP RIGHT AWAY IF:   You have a blood pressure reading with a top number of 180 or higher.  You get a very bad headache.  You get blurred or changing vision.  You feel confused.  You feel weak, numb, or faint.  You get chest or belly (abdominal) pain.  You throw up (vomit).  You cannot breathe very well. MAKE SURE YOU:   Understand these instructions.  Will watch your condition.  Will get help right away if you are not doing well or get worse. Document Released: 04/09/2008 Document Revised: 01/14/2012 Document Reviewed: 04/09/2008 [facility] Patient Information 2013 [place], [place].

## 2012-12-17 NOTE — Progress Notes (Signed)
  Subjective:    Patient ID: Andrea [name], female    DOB: [birth year], 52 y.o.   MRN: [id number]  Hypertension    1. Lightheaded. 52 yo female with uncontrolled DM2, uncontrolled HTN who was just discharged from [facility] on 2/10 for hypertension urgency and chest pain. She ruled out with enzymes and had 2 agents increased doses: cozaar and clonidine. Patient felt well yesterday, but states she woke up early this morning with general feeling of weakness, lightheadedness and some blurred/tunnel vision. She has felt this way most of the morning. She checked BP at home upon awakening and was 204/109 per her home cuff. She has taken all meds as prescribed, except she did skip the HCTZ dose last pm, because she didn't want to urinate at night. Before her admission she was noncompliant and somewhat in denial, but currently she is very concerned about keeping her BP controlled, and states she felt nervous so wanted to get checked out.   Review of Systems She denies any chest pain since DC, dyspnea, severe headache, loss of vision, eye pain, one-sided weakness, difficulty with speech or swallowing, abdomina pain, nausea, emesis, neck stiffness, fever.     Objective:   Physical Exam  Vitals reviewed. Constitutional: She is oriented to person, place, and time. She appears well-developed and well-nourished. No distress.  HENT:  Head: Normocephalic and atraumatic.  Mouth/Throat: Oropharynx is clear and moist. No oropharyngeal exudate.  Eyes: EOM are normal. Pupils are equal, round, and reactive to light.  Fundic exam-no abnormalities observed.  Neck: Normal range of motion. Neck supple. No thyromegaly present.  Cardiovascular: Normal rate, regular rhythm and normal heart sounds.   No murmur heard. Pulmonary/Chest: Effort normal and breath sounds normal. No respiratory distress. She has no wheezes. She has no rales.  Musculoskeletal: She exhibits no edema and no tenderness.  Neurological: She is alert and  oriented to person, place, and time. No cranial nerve deficit. She exhibits normal muscle tone.  Normal romberg, normal coordination-finger/nose testing. Normal strength in B grip and sensation intact. Normal gait and speech.  Skin: No rash noted.  Psychiatric: She has a normal mood and affect.          Assessment & Plan:

## 2012-12-17 NOTE — Assessment & Plan Note (Signed)
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

## 2012-12-17 NOTE — Assessment & Plan Note (Signed)
CBG 238 today, improved from hospital. Not likely cause of her symptoms today. She will f/u in clinic Friday to initiate lantus at low dose.

## 2012-12-19 ENCOUNTER — Encounter (HOSPITAL_COMMUNITY): Payer: Self-pay | Admitting: *Deleted

## 2012-12-19 ENCOUNTER — Emergency Department (HOSPITAL_COMMUNITY)
Admission: EM | Admit: 2012-12-19 | Discharge: 2012-12-20 | Disposition: A | Discharge status: Home / Self Care | Payer: Medicaid Other | Attending: Emergency Medicine | Admitting: Emergency Medicine

## 2012-12-19 ENCOUNTER — Encounter (HOSPITAL_COMMUNITY): Payer: Self-pay

## 2012-12-19 ENCOUNTER — Emergency Department (HOSPITAL_COMMUNITY)
Admission: EM | Admit: 2012-12-19 | Discharge: 2012-12-19 | Disposition: A | Discharge status: Nursing Home (Includes ICF) | Payer: Medicaid Other | Source: Home / Self Care | Attending: Family Medicine | Admitting: Family Medicine

## 2012-12-19 ENCOUNTER — Ambulatory Visit: Payer: Medicaid Other | Admitting: Pharmacist

## 2012-12-19 DIAGNOSIS — R51 Headache: Secondary | ICD-10-CM

## 2012-12-19 DIAGNOSIS — E119 Type 2 diabetes mellitus without complications: Secondary | ICD-10-CM | POA: Insufficient documentation

## 2012-12-19 DIAGNOSIS — Z862 Personal history of diseases of the blood and blood-forming organs and certain disorders involving the immune mechanism: Secondary | ICD-10-CM | POA: Insufficient documentation

## 2012-12-19 DIAGNOSIS — F430 Acute stress reaction: Secondary | ICD-10-CM | POA: Insufficient documentation

## 2012-12-19 DIAGNOSIS — F39 Unspecified mood [affective] disorder: Secondary | ICD-10-CM | POA: Insufficient documentation

## 2012-12-19 DIAGNOSIS — I169 Hypertensive crisis, unspecified: Secondary | ICD-10-CM

## 2012-12-19 DIAGNOSIS — Z79899 Other long term (current) drug therapy: Secondary | ICD-10-CM | POA: Insufficient documentation

## 2012-12-19 DIAGNOSIS — Z8639 Personal history of other endocrine, nutritional and metabolic disease: Secondary | ICD-10-CM | POA: Insufficient documentation

## 2012-12-19 DIAGNOSIS — Z7982 Long term (current) use of aspirin: Secondary | ICD-10-CM | POA: Insufficient documentation

## 2012-12-19 DIAGNOSIS — I10 Essential (primary) hypertension: Secondary | ICD-10-CM

## 2012-12-19 DIAGNOSIS — E7800 Pure hypercholesterolemia, unspecified: Secondary | ICD-10-CM | POA: Insufficient documentation

## 2012-12-19 DIAGNOSIS — G479 Sleep disorder, unspecified: Secondary | ICD-10-CM | POA: Insufficient documentation

## 2012-12-19 DIAGNOSIS — F411 Generalized anxiety disorder: Secondary | ICD-10-CM | POA: Insufficient documentation

## 2012-12-19 LAB — BASIC METABOLIC PANEL
BUN: 13 mg/dL (ref 6–23)
Chloride: 98 mEq/L (ref 96–112)
GFR calc Af Amer: >90 mL/min (ref >90)
Potassium: 4 mEq/L (ref 3.5–5.1)
Sodium: 137 mEq/L (ref 135–145)

## 2012-12-19 LAB — CBC WITH DIFFERENTIAL/PLATELET
Basophils Absolute: 0 K/uL (ref 0.0–0.1)
Basophils Relative: 0 % (ref 0–1)
Eosinophils Absolute: 0.1 K/uL (ref 0.0–0.7)
Eosinophils Relative: 1 % (ref 0–5)
HCT: 41.1 % (ref 36.0–46.0)
Hemoglobin: 14.2 g/dL (ref 12.0–15.0)
Lymphocytes Relative: 35 % (ref 12–46)
Lymphs Abs: 2.5 10*3/uL (ref 0.7–4.0)
MCH: 28.2 pg (ref 26.0–34.0)
MCHC: 34.5 g/dL (ref 30.0–36.0)
MCV: 81.7 fL (ref 78.0–100.0)
Monocytes Absolute: 0.5 K/uL (ref 0.1–1.0)
Monocytes Relative: 7 % (ref 3–12)
Neutro Abs: 4.1 10*3/uL (ref 1.7–7.7)
Neutrophils Relative %: 57 % (ref 43–77)
Platelets: 414 K/uL — ABNORMAL HIGH (ref 150–400)
RBC: 5.03 MIL/uL (ref 3.87–5.11)
RDW: 13.7 % (ref 11.5–15.5)
WBC: 7.2 K/uL (ref 4.0–10.5)

## 2012-12-19 LAB — BASIC METABOLIC PANEL WITH GFR
CO2: 26 meq/L (ref 19–32)
Calcium: 11.4 mg/dL — ABNORMAL HIGH (ref 8.4–10.5)
Creatinine, Ser: 0.78 mg/dL (ref 0.50–1.10)
GFR calc non Af Amer: >90 mL/min (ref >90)
Glucose, Bld: 153 mg/dL — ABNORMAL HIGH (ref 70–99)

## 2012-12-19 MED ORDER — ACETAMINOPHEN-CODEINE #3 300-30 MG PO TABS
2.0000 | ORAL_TABLET | Freq: Once | ORAL | Status: AC
  Administered 2012-12-19: 2 via ORAL
  Filled 2012-12-19: qty 2

## 2012-12-19 NOTE — ED Notes (Signed)
States she was seen in ED on 2/10 and was started on BP medicine.  F/u at [facility] on 2/12 and no change in meds.  Was supposed to be seen today but appt. Cancelled due to weather.  She has to schedule another appointment.  She took her BP medicine @ 0900.  C/o blurred vision, dizziness,  occasional arm and leg pain and headache.

## 2012-12-19 NOTE — ED Notes (Signed)
Patient states she has not been feeling well lately.  Noticed her sugars have been elevated (283-315) and her head has been hurting due to her BP being elevated.  Patient in no distress now.

## 2012-12-19 NOTE — Discharge Instructions (Signed)
Arterial Hypertension °Arterial hypertension (high blood pressure) is a condition of elevated pressure in your blood vessels. Hypertension over a long period of time is a risk factor for strokes, heart attacks, and heart failure. It is also the leading cause of kidney (renal) failure.  °CAUSES  °· In Adults -- Over 90% of all hypertension has no known cause. This is called essential or primary hypertension. In the other 10% of people with hypertension, the increase in blood pressure is caused by another disorder. This is called secondary hypertension. Important causes of secondary hypertension are: °· Heavy alcohol use. °· Obstructive sleep apnea. °· Hyperaldosterosim (Conn's syndrome). °· Steroid use. °· Chronic kidney failure. °· Hyperparathyroidism. °· Medications. °· Renal artery stenosis. °· Pheochromocytoma. °· Cushing's disease. °· Coarctation of the aorta. °· Scleroderma renal crisis. °· Licorice (in excessive amounts). °· Drugs (cocaine, methamphetamine). °Your caregiver can explain any items above that apply to you. °· In Children -- Secondary hypertension is more common and should always be considered. °· Pregnancy -- Few women of childbearing age have high blood pressure. However, up to 10% of them develop hypertension of pregnancy. Generally, this will not harm the woman. It may be a sign of 3 complications of pregnancy: preeclampsia, HELLP syndrome, and eclampsia. Follow up and control with medication is necessary. °SYMPTOMS  °· This condition normally does not produce any noticeable symptoms. It is usually found during a routine exam. °· Malignant hypertension is a late problem of high blood pressure. It may have the following symptoms: °· Headaches. °· Blurred vision. °· End-organ damage (this means your kidneys, heart, lungs, and other organs are being damaged). °· Stressful situations can increase the blood pressure. If a person with normal blood pressure has their blood pressure go up while being  seen by their caregiver, this is often termed "white coat hypertension." Its importance is not known. It may be related with eventually developing hypertension or complications of hypertension. °· Hypertension is often confused with mental tension, stress, and anxiety. °DIAGNOSIS  °The diagnosis is made by 3 separate blood pressure measurements. They are taken at least 1 week apart from each other. If there is organ damage from hypertension, the diagnosis may be made without repeat measurements. °Hypertension is usually identified by having blood pressure readings: °· Above 140/90 mmHg measured in both arms, at 3 separate times, over a couple weeks. °· Over 130/80 mmHg should be considered a risk factor and may require treatment in patients with diabetes. °Blood pressure readings over 120/80 mmHg are called "pre-hypertension" even in non-diabetic patients. °To get a true blood pressure measurement, use the following guidelines. Be aware of the factors that can alter blood pressure readings. °· Take measurements at least 1 hour after caffeine. °· Take measurements 30 minutes after smoking and without any stress. This is another reason to quit smoking  it raises your blood pressure. °· Use a proper cuff size. Ask your caregiver if you are not sure about your cuff size. °· Most home blood pressure cuffs are automatic. They will measure systolic and diastolic pressures. The systolic pressure is the pressure reading at the start of sounds. Diastolic pressure is the pressure at which the sounds disappear. If you are elderly, measure pressures in multiple postures. Try sitting, lying or standing. °· Sit at rest for a minimum of 5 minutes before taking measurements. °· You should not be on any medications like decongestants. These are found in many cold medications. °· Record your blood pressure readings and review   them with your caregiver. °If you have hypertension: °· Your caregiver may do tests to be sure you do not have  secondary hypertension (see "causes" above). °· Your caregiver may also look for signs of metabolic syndrome. This is also called Syndrome X or Insulin Resistance Syndrome. You may have this syndrome if you have type 2 diabetes, abdominal obesity, and abnormal blood lipids in addition to hypertension. °· Your caregiver will take your medical and family history and perform a physical exam. °· Diagnostic tests may include blood tests (for glucose, cholesterol, potassium, and kidney function), a urinalysis, or an EKG. Other tests may also be necessary depending on your condition. °PREVENTION  °There are important lifestyle issues that you can adopt to reduce your chance of developing hypertension: °· Maintain a normal weight. °· Limit the amount of salt (sodium) in your diet. °· Exercise often. °· Limit alcohol intake. °· Get enough potassium in your diet. Discuss specific advice with your caregiver. °· Follow a DASH diet (dietary approaches to stop hypertension). This diet is rich in fruits, vegetables, and low-fat dairy products, and avoids certain fats. °PROGNOSIS  °Essential hypertension cannot be cured. Lifestyle changes and medical treatment can lower blood pressure and reduce complications. The prognosis of secondary hypertension depends on the underlying cause. Many people whose hypertension is controlled with medicine or lifestyle changes can live a normal, healthy life.  °RISKS AND COMPLICATIONS  °While high blood pressure alone is not an illness, it often requires treatment due to its short- and long-term effects on many organs. Hypertension increases your risk for: °· CVAs or strokes (cerebrovascular accident). °· Heart failure due to chronically high blood pressure (hypertensive cardiomyopathy). °· Heart attack (myocardial infarction). °· Damage to the retina (hypertensive retinopathy). °· Kidney failure (hypertensive nephropathy). °Your caregiver can explain list items above that apply to you. Treatment  of hypertension can significantly reduce the risk of complications. °TREATMENT  °· For overweight patients, weight loss and regular exercise are recommended. Physical fitness lowers blood pressure. °· Mild hypertension is usually treated with diet and exercise. A diet rich in fruits and vegetables, fat-free dairy products, and foods low in fat and salt (sodium) can help lower blood pressure. Decreasing salt intake decreases blood pressure in a 1/3 of people. °· Stop smoking if you are a smoker. °The steps above are highly effective in reducing blood pressure. While these actions are easy to suggest, they are difficult to achieve. Most patients with moderate or severe hypertension end up requiring medications to bring their blood pressure down to a normal level. There are several classes of medications for treatment. Blood pressure pills (antihypertensives) will lower blood pressure by their different actions. Lowering the blood pressure by 10 mmHg may decrease the risk of complications by as much as 25%. °The goal of treatment is effective blood pressure control. This will reduce your risk for complications. Your caregiver will help you determine the best treatment for you according to your lifestyle. What is excellent treatment for one person, may not be for you. °HOME CARE INSTRUCTIONS  °· Do not smoke. °· Follow the lifestyle changes outlined in the "Prevention" section. °· If you are on medications, follow the directions carefully. Blood pressure medications must be taken as prescribed. Skipping doses reduces their benefit. It also puts you at risk for problems. °· Follow up with your caregiver, as directed. °· If you are asked to monitor your blood pressure at home, follow the guidelines in the "Diagnosis" section above. °SEEK MEDICAL CARE   HOME CARE INSTRUCTIONS     Do not smoke.   Follow the lifestyle changes outlined in the "Prevention" section.   If you are on medications, follow the directions carefully. Blood pressure medications must be taken as prescribed. Skipping doses reduces their benefit. It also puts you at risk for problems.   Follow up with your caregiver, as directed.   If you are asked to monitor your blood pressure at home, follow the guidelines in the "Diagnosis" section above.  SEEK MEDICAL CARE IF:     You think you are having medication side effects.   You have recurrent headaches or lightheadedness.   You have swelling in your ankles.   You  have trouble with your vision.  SEEK IMMEDIATE MEDICAL CARE IF:     You have sudden onset of chest pain or pressure, difficulty breathing, or other symptoms of a heart attack.   You have a severe headache.   You have symptoms of a stroke (such as sudden weakness, difficulty speaking, difficulty walking).  MAKE SURE YOU:     Understand these instructions.   Will watch your condition.   Will get help right away if you are not doing well or get worse.  Document Released: 10/22/2005 Document Revised: 01/14/2012 Document Reviewed: 05/22/2007  [employer] Patient Information 2013 [employer].

## 2012-12-19 NOTE — ED Provider Notes (Signed)
History     CSN: [id number]  Arrival date & time 12/19/12  2440   First MD Initiated Contact with Patient 12/19/12 2048      Chief Complaint  Patient presents with  . Headache  . Hypertension    (Consider location/radiation/quality/duration/timing/severity/associated sxs/prior treatment) HPI Comments: Patient with history of poorly controlled hypertension, was recently admitted for difficulties with blood pressure and diabetes approximately one month ago reports that her sister passed away recently and earlier today was at the funeral home try and make arrangements. She reports that she has been under a lot of stress due to this, has been complaining of an intermittent headache across the frontal portion of her head that lasts about 10 minutes over the last 2 days. She reports that she has not been able to sleep well due to the increased stress. She reports she has been compliant with her medications. She thinks that her blood pressure spiked and she had a bad headache after she got very emotionally upset today and presented to the [facility] urgent care. She was seen there and had very elevated blood pressure and the patient was referred down to the emergency department for possible hypertensive crisis. Patient admits that while here she did calm down quite a bit and subsequently her blood pressures here in emergency Department has steadily decreased and her most recent blood pressure was 160/85. She endorses a mild headache. She denies any focal numbness or weakness, slurred speech, facial droop. She denies chest pain or shortness of breath, sweats or nausea. She also reports that she was supposed to be seen by her primary care office today to help check on her medications to ensure that they were properly dosed. D2 the recent weather, her clinic was closed.  Patient is a 52 y.o. female presenting with headaches and hypertension. The history is provided by the patient and medical records.   Headache Hypertension Associated symptoms include headaches. Pertinent negatives include no chest pain.    Past Medical History  Diagnosis Date  . Hypertension   . Diabetes mellitus   . High cholesterol   . Hyperthyroidism     Radioactive iodine ablation  . Environmental allergies     Past Surgical History  Procedure Laterality Date  . Cesarean section      x2    Family History  Problem Relation Age of Onset  . Pneumonia Mother   . Diabetes Mother   . Hypertension Father   . Hypertension Sister   . Hyperlipidemia Sister   . Hypertension Brother   . Hypertension Maternal Aunt   . Hypertension Paternal Aunt   . Cancer Paternal Aunt     throat cancer  . Diabetes Maternal Grandmother   . Diabetes Paternal Grandmother   . Cancer Paternal Grandmother     pancreatic cancer    History  Substance Use Topics  . Smoking status: Never Smoker   . Smokeless tobacco: Never Used  . Alcohol Use: No     Comment: occasional for special occasions    OB History   Grav Para Term Preterm Abortions TAB SAB Ect Mult Living                  Review of Systems  Respiratory: Negative for chest tightness.   Cardiovascular: Negative for chest pain.  Neurological: Positive for headaches. Negative for syncope, speech difficulty, weakness and light-headedness.  Psychiatric/Behavioral: Positive for sleep disturbance and dysphoric mood. The patient is nervous/anxious.   All other systems reviewed  and are negative.    Allergies  Review of patient's allergies indicates no known allergies.  Home Medications   Current Outpatient Rx  Name  Route  Sig  Dispense  Refill  . amLODipine (NORVASC) 10 MG tablet   Oral   Take 1 tablet (10 mg total) by mouth daily.   30 tablet   3   . aspirin EC 81 MG tablet   Oral   Take 81 mg by mouth daily.         Marland Kitchen atorvastatin (LIPITOR) 40 MG tablet   Oral   Take 1 tablet (40 mg total) by mouth daily.   30 tablet   5   . cloNIDine  (CATAPRES) 0.3 MG tablet   Oral   Take 1 tablet (0.3 mg total) by mouth 2 (two) times daily.   60 tablet   1   . fexofenadine (ALLEGRA) 180 MG tablet   Oral   Take 180 mg by mouth daily as needed. For seasonal allergies         . glipiZIDE (GLUCOTROL) 10 MG tablet   Oral   Take 1 tablet (10 mg total) by mouth 2 (two) times daily before a meal.   60 tablet   1   . hydrochlorothiazide (HYDRODIURIL) 25 MG tablet   Oral   Take 1 tablet (25 mg total) by mouth daily.   30 tablet   3   . losartan (COZAAR) 100 MG tablet   Oral   Take 50 mg by mouth 2 (two) times daily.         . metFORMIN (GLUCOPHAGE) 1000 MG tablet   Oral   Take 1 tablet (1,000 mg total) by mouth 2 (two) times daily with a meal.   60 tablet   3   . metoprolol (LOPRESSOR) 100 MG tablet   Oral   Take 0.5 tablets (50 mg total) by mouth 2 (two) times daily.   30 tablet   3   . triamcinolone (NASACORT AQ) 55 MCG/ACT nasal inhaler   Nasal   Place 1-2 sprays into the nose daily as needed (nasal congestion).            BP 161/90  Pulse 60  Temp(Src) 98.1 F (36.7 C) (Oral)  Resp 20  SpO2 97%  LMP 11/12/2012  Physical Exam  Nursing note and vitals reviewed. Constitutional: She is oriented to person, place, and time. She appears well-developed and well-nourished.  HENT:  Head: Normocephalic and atraumatic.  Eyes: EOM are normal. Pupils are equal, round, and reactive to light.  Neck: Normal range of motion. Neck supple. No JVD present.  Cardiovascular: Normal rate and regular rhythm.   Pulmonary/Chest: Effort normal. No respiratory distress. She has no wheezes.  Abdominal: Soft. She exhibits no distension. There is no tenderness.  Musculoskeletal: She exhibits no edema.  Neurological: She is alert and oriented to person, place, and time. No cranial nerve deficit. She exhibits normal muscle tone. Coordination normal.  Skin: Skin is warm and dry.  Psychiatric: Her speech is normal and behavior is  normal. Judgment and thought content normal. Cognition and memory are normal. She exhibits a depressed mood.  Patient with minimal depression secondary to loss of patient's sister.    ED Course  Procedures (including critical care time)  Labs Reviewed  CBC WITH DIFFERENTIAL - Abnormal; Notable for the following:    Platelets 414 (*)    All other components within normal limits  BASIC METABOLIC PANEL - Abnormal; Notable for  the following:    Glucose, Bld 153 (*)    Calcium 11.4 (*)    All other components within normal limits   No results found.   1. Hypertension   2. Headache     Room air saturation is 97% I interpret this to be normal.   10:21 PM Pt feels somewhat improved, no focal deficits, no HTN crisis evident.    MDM   Patient's blood pressure has improved after she was calmed down from her emotional upset earlier today. I do not think the patient is having a hypertensive crisis. I think her episode of hypotension was related to her emotional state. I also think that her headache is mild and generalized and secondary to increased stress, decrease in sleeping coupled with her difficulty controlling her health issues. I spoke to family practice who has instructed me to inform the patient to call the clinic on Monday to obtain a quick followup on Monday. Patient has been given some pain medication here which I suspect will improve her headache and she will feel improved afterwards. Patient is reassured. No focal deficits were found on examination.        Gavin Pound. Oletta Lamas, MD 12/19/12 2221

## 2012-12-19 NOTE — ED Provider Notes (Signed)
History     CSN: [id number]  Arrival date & time 12/19/12  1642   First MD Initiated Contact with Patient 12/19/12 1723      Chief Complaint  Patient presents with  . Hypertension    (Consider location/radiation/quality/duration/timing/severity/associated sxs/prior treatment) HPI Comments: 52 year old female with history of poorly controlled diabetes and poorly controlled hypertension. Here complaining of intermittent headache and blurred vision. Patient was seen in the emergency department 5 days ago for chest pain and hypertensive crisis. She had been off her medications for over a month. She had her medications restarted and patient states that she has taken all her medications today in the morning (amlodipine 10 mg, clonidine 0.2, hydrochlorothiazide 25 mg and Lopressor 50 mg) despite taking her blood pressure medications he has continued to feel intermittent headaches flushing sensation in her face and blurred vision since her last hospital discharge. Symptoms worse today reason she decided to come here. Patient denies current chest pain or shortness of breath. Denies leg swelling or PND. Her hemoglobin A1c was 15 earlier this month.   Past Medical History  Diagnosis Date  . Hypertension   . Diabetes mellitus   . High cholesterol   . Hyperthyroidism     Radioactive iodine ablation  . Environmental allergies     Past Surgical History  Procedure Laterality Date  . Cesarean section      x2    Family History  Problem Relation Age of Onset  . Pneumonia Mother   . Diabetes Mother   . Hypertension Father   . Hypertension Sister   . Hyperlipidemia Sister   . Hypertension Brother   . Hypertension Maternal Aunt   . Hypertension Paternal Aunt   . Cancer Paternal Aunt     throat cancer  . Diabetes Maternal Grandmother   . Diabetes Paternal Grandmother   . Cancer Paternal Grandmother     pancreatic cancer    History  Substance Use Topics  . Smoking status: Never Smoker    . Smokeless tobacco: Never Used  . Alcohol Use: No     Comment: occasional for special occasions    OB History   Grav Para Term Preterm Abortions TAB SAB Ect Mult Living                  Review of Systems  Constitutional: Negative for diaphoresis.  Eyes: Positive for visual disturbance.  Respiratory: Negative for shortness of breath.   Cardiovascular: Negative for chest pain and leg swelling.  Gastrointestinal: Negative for nausea, vomiting and abdominal pain.  Neurological: Positive for dizziness and headaches. Negative for seizures, syncope, speech difficulty, weakness and numbness.  All other systems reviewed and are negative.    Allergies  Review of patient's allergies indicates no known allergies.  Home Medications   Current Outpatient Rx  Name  Route  Sig  Dispense  Refill  . amLODipine (NORVASC) 10 MG tablet   Oral   Take 1 tablet (10 mg total) by mouth daily.   30 tablet   3   . aspirin EC 81 MG tablet   Oral   Take 81 mg by mouth daily.         Marland Kitchen atorvastatin (LIPITOR) 40 MG tablet   Oral   Take 1 tablet (40 mg total) by mouth daily.   30 tablet   5   . cloNIDine (CATAPRES) 0.3 MG tablet   Oral   Take 1 tablet (0.3 mg total) by mouth 2 (two) times daily.  60 tablet   1   . glipiZIDE (GLUCOTROL) 10 MG tablet   Oral   Take 1 tablet (10 mg total) by mouth 2 (two) times daily before a meal.   60 tablet   1   . hydrochlorothiazide (HYDRODIURIL) 25 MG tablet   Oral   Take 1 tablet (25 mg total) by mouth daily.   30 tablet   3   . losartan (COZAAR) 100 MG tablet   Oral   Take 50 mg by mouth 2 (two) times daily.         . metFORMIN (GLUCOPHAGE) 1000 MG tablet   Oral   Take 1 tablet (1,000 mg total) by mouth 2 (two) times daily with a meal.   60 tablet   3   . metoprolol (LOPRESSOR) 100 MG tablet   Oral   Take 0.5 tablets (50 mg total) by mouth 2 (two) times daily.   30 tablet   3   . fexofenadine (ALLEGRA) 180 MG tablet   Oral    Take 180 mg by mouth daily as needed. For seasonal allergies         . triamcinolone (NASACORT AQ) 55 MCG/ACT nasal inhaler   Nasal   Place 1-2 sprays into the nose daily as needed (nasal congestion).            BP 236/122  Pulse 76  Temp(Src) 98.2 F (36.8 C) (Oral)  Resp 20  SpO2 97%  LMP 11/12/2012  Physical Exam  Nursing note and vitals reviewed. Constitutional: She is oriented to person, place, and time. No distress.  Obese  HENT:  Head: Normocephalic and atraumatic.  Mouth/Throat: Oropharynx is clear and moist.  Eyes: EOM are normal. Pupils are equal, round, and reactive to light. Right eye exhibits no discharge. Left eye exhibits no discharge. No scleral icterus.  Bilateral conjunctival injection  Neck: Normal range of motion. Neck supple. No JVD present.  Cardiovascular: Normal rate and regular rhythm.  Exam reveals no gallop and no friction rub.   Trace bilateral low extremity edema  Pulmonary/Chest: Effort normal and breath sounds normal. No respiratory distress. She has no wheezes. She has no rales. She exhibits no tenderness.  Neurological: She is alert and oriented to person, place, and time.  Skin: She is not diaphoretic.  Psychiatric: She has a normal mood and affect. Her behavior is normal. Judgment and thought content normal.    ED Course  Procedures (including critical care time)  Labs Reviewed - No data to display No results found.   1. Hypertensive crisis     EKG:  sinus rhythm with a ventricular rate at 70 beats per minutes flattening of the T waves and depressed ST in inferior and lateral leads similar/unchanged from EKG on 12/15/2012.   MDM  52 year old female with history of poorly controlled diabetes and poorly controlled hypertension. Here complaining of intermittent headache and blurred vision. On exam: Patient looks comfortable. Blood pressure 230's/120's, pulse in the 70s. No focal neurologic findings on exam. Denies chest pain. Patient  was transferred to the emergency department for further evaluation and management.         Sharin Grave, MD 12/19/12 1759

## 2012-12-19 NOTE — ED Notes (Signed)
Pt was seen here on Monday for HTN and then at [facility] today. Had an appt with PCP but was cancelled d/t weather. Continues to report headaches, blurry vision, odd sensations in bilateral legs and left arm. Also reports some nausea at times. No facial droop or slurred speech.

## 2012-12-23 ENCOUNTER — Encounter: Payer: Self-pay | Admitting: Physician Assistant

## 2012-12-23 ENCOUNTER — Ambulatory Visit (INDEPENDENT_AMBULATORY_CARE_PROVIDER_SITE_OTHER): Payer: Medicaid Other | Admitting: Physician Assistant

## 2012-12-23 ENCOUNTER — Ambulatory Visit (INDEPENDENT_AMBULATORY_CARE_PROVIDER_SITE_OTHER): Payer: Medicaid Other | Admitting: Pharmacist

## 2012-12-23 VITALS — BP 113/69 | HR 65 | Ht 65.25 in | Wt 277.0 lb

## 2012-12-23 VITALS — BP 130/88 | HR 63 | Ht 65.0 in | Wt 276.0 lb

## 2012-12-23 DIAGNOSIS — E119 Type 2 diabetes mellitus without complications: Secondary | ICD-10-CM

## 2012-12-23 DIAGNOSIS — R9431 Abnormal electrocardiogram [ECG] [EKG]: Secondary | ICD-10-CM

## 2012-12-23 DIAGNOSIS — R079 Chest pain, unspecified: Secondary | ICD-10-CM

## 2012-12-23 DIAGNOSIS — I10 Essential (primary) hypertension: Secondary | ICD-10-CM

## 2012-12-23 NOTE — Assessment & Plan Note (Addendum)
Diabetes of ~5 yr duration currently under poor control of blood glucose based on  . Lab Results  Component Value Date   HGBA1C 15.1* 12/14/2012  ,though past A1c in July was 7.7. However, since her recent restarting meds and diet home AM fasting CBG readings are 170-190 and CBG readings before bed  Of 160-170. Control was suboptimal when A1c was drawn due to non-compliance and poor diet. Denies hypoglycemic events.  Able to verbalize appropriate hypoglycemia management plan. Will continue metformin and glipizide with a focus on improved diet and exercise. Spoke with her about good diet options and provided Transport planner. Written patient instructions provided.  Follow up in  Pharmacist Clinic Visit in 1 month.   Total time in face to face counseling 30 minutes.  Patient seen with Drue Stager PharmD, pharmacy resident.

## 2012-12-23 NOTE — Patient Instructions (Addendum)
It was nice seeing you today, work on these goals and we'll see you again in a month to see how you're doing  1. Work on [employer] and exercise and try to lose 10 lbs by the next visit 2. Continue your current diabetes medications 3. Continue checking your blood sugar and pressure at home

## 2012-12-23 NOTE — Progress Notes (Signed)
[address], [address] [place], [place]  [address] Phone: [phone], Fax:  [phone]  Date:  12/23/2012   ID:  CLARYSSA [name], DOB 1961-01-26, MRN [id number]  PCP:  Despina Hick, MD  Primary Cardiologist:  Dr. Verne Carrow     History of Present Illness: Andrea [name] is a 52 y.o. female who returns for follow up after a recent admission to the hospital for chest pain in the setting of uncontrolled BP (HTN urgency).    She is a prior patient of mine at [facility]. She has a history of DM2, HTN, HL, hyperthyroidism, s/p RAI Rx, anxiety.  Established with Dr. Verne Carrow in 6/13 after admission to the hospital with pulmonary edema in the setting of HTN emergency.  She was admitted 2/8-2/10 with chest pain in the setting of hypertensive urgency. Chest pain improved with control of her blood pressure. She was noted to be noncompliant with medications at home. A1c was significantly elevated. Medications were resumed and she was set up with follow up at [facility] outpatient [facility] clinic. She was seen by Dr. Daleen Squibb in consultation. It was felt that her chest pain was likely related to demand ischemia from poorly controlled blood pressure and hypertensive crisis. Initially, good blood pressure control was recommended prior to pursuing any further cardiac evaluation.  Since d/c, she is doing well. No further chest pain or dyspnea.  She is NYHA class II.  No orthopnea, PND, edema.  No syncope.  Biggest complaint is blurry vision and headaches.    Labs (2/14):  K 4, creatinine 0.78, LDL 91, Hgb 14.2, A1c 15.1, TSH 0.776  Wt Readings from Last 3 Encounters:  12/23/12 276 lb (125.193 kg)  12/17/12 285 lb (129.275 kg)  12/14/12 283 lb 4.8 oz (128.504 kg)     Past Medical History  Diagnosis Date  . Hypertension   . Diabetes mellitus   . High cholesterol   . Hyperthyroidism     Radioactive iodine ablation  . Environmental allergies     Current  Outpatient Prescriptions  Medication Sig Dispense Refill  . amLODipine (NORVASC) 10 MG tablet Take 1 tablet (10 mg total) by mouth daily.  30 tablet  3  . aspirin EC 81 MG tablet Take 81 mg by mouth daily.      Marland Kitchen atorvastatin (LIPITOR) 40 MG tablet Take 1 tablet (40 mg total) by mouth daily.  30 tablet  5  . cloNIDine (CATAPRES) 0.3 MG tablet Take 1 tablet (0.3 mg total) by mouth 2 (two) times daily.  60 tablet  1  . fexofenadine (ALLEGRA) 180 MG tablet Take 180 mg by mouth daily as needed. For seasonal allergies      . glipiZIDE (GLUCOTROL) 10 MG tablet Take 1 tablet (10 mg total) by mouth 2 (two) times daily before a meal.  60 tablet  1  . hydrochlorothiazide (HYDRODIURIL) 25 MG tablet Take 1 tablet (25 mg total) by mouth daily.  30 tablet  3  . losartan (COZAAR) 100 MG tablet Take 50 mg by mouth 2 (two) times daily.      . metoprolol (LOPRESSOR) 100 MG tablet Take 0.5 tablets (50 mg total) by mouth 2 (two) times daily.  30 tablet  3  . triamcinolone (NASACORT AQ) 55 MCG/ACT nasal inhaler Place 1-2 sprays into the nose daily as needed (nasal congestion).       . metFORMIN (GLUCOPHAGE) 1000 MG tablet Take 1 tablet (1,000 mg total) by mouth 2 (two) times  daily with a meal.  60 tablet  3  . [DISCONTINUED] sertraline (ZOLOFT) 50 MG tablet Take 50 mg by mouth daily.       No current facility-administered medications for this visit.    Allergies:   No Known Allergies  Social History:  The patient  reports that she has never smoked. She has never used smokeless tobacco. She reports that she does not drink alcohol or use illicit drugs.   ROS:  Please see the history of present illness.   She has a chronic cough.   All other systems reviewed and negative.   PHYSICAL EXAM: VS:  BP 130/88  Pulse 63  Ht 5\' 5"  (1.651 m)  Wt 276 lb (125.193 kg)  BMI 45.93 kg/m2  LMP 11/12/2012 Well nourished, well developed, in no acute distress HEENT: normal Neck: no JVD Cardiac:  normal S1, S2; RRR; no  murmur Lungs:  clear to auscultation bilaterally, no wheezing, rhonchi or rales Abd: soft, nontender, no hepatomegaly Ext: no edema Skin: warm and dry Neuro:  CNs 2-12 intact, no focal abnormalities noted  EKG:  NSR, HR 63, normal axis, possible inf Q waves, inf-lat TW inversions     ASSESSMENT AND PLAN:  1. Chest Pain:  Agree that her CP was most likely related to uncontrolled BP.  However, she has an abnormal ECG and is at risk for CAD with CAD risk equivalent (DM2).  I compared her current ECG to one done in 2011.  These changes are definitely new.  BP is much better now.  I will arrange an ETT-Myoview for risk stratification and an Echo to assess LV wall thickness and LV wall motion.  If these are largely unremarkable, she will not require further cardiac testing.  Continue ASA. 2. Hypertension:  Better controlled.  Continue current Rx. 3. Diabetes Mellitus:  Suspect blurry vision and headaches related to uncontrolled glucose.  She has follow up with PCP arranged. 4. Hyperlipidemia:  Continue statin. 5. Hyperthyroidism:  S/p RAI treatment.  Recent TSH normal. 6. Disposition:  Follow up with me or Dr. Verne Carrow in 6 mos or sooner if [facility] or echo abnormal.    Signed, Tereso Newcomer, PA-C  11:06 AM 12/23/2012

## 2012-12-23 NOTE — Patient Instructions (Addendum)
Your physician recommends that you continue on your current medications as directed. Please refer to the Current Medication list given to you today.   Your physician has requested that you have en exercise stress myoview DX 786.50, 401.1, . For further information please visit [email or web address] Please follow instruction sheet, as given.  Your physician has requested that you have an echocardiogram DX ABNORMAL EKG, CHEST PAIN. Echocardiography is a painless test that uses sound waves to create images of your heart. It provides your doctor with information about the size and shape of your heart and how well your heart's chambers and valves are working. This procedure takes approximately one hour. There are no restrictions for this procedure.  Your physician wants you to follow-up in: 6 MONTHS WITH DR. Clifton James. You will receive a reminder letter in the mail two months in advance. If you don't receive a letter, please call our office to schedule the follow-up appointment.

## 2012-12-23 NOTE — Progress Notes (Signed)
HPI: Patient presents in good spirits with her mother. She presented for insulin initation with her recently concerning A1c of 15.1. Pt had been decently controlled previously on only metformin and glipizide, but said she had stopped taking all of her medications and stopped dieting prior to last visit. At that time she was experiencing nocturia 10+ times/night. Her vision was blurry and Patient has since restarted her medications and improved her diet. She has been checking her BG 2x a day without any BG >200 and her nocturia has improved to only needing to go to the bathroom 1-2x per evening.  A/P: Diabetes of ~5 yr duration currently under poor control of blood glucose based on  . Lab Results  Component Value Date   HGBA1C 15.1* 12/14/2012  ,though past A1c in July was 7.7. However, since her recent restarting meds and diet home AM fasting CBG readings are 170-190 and CBG readings before bed  Of 160-170. Control was suboptimal when A1c was drawn due to non-compliance and poor diet. Denies hypoglycemic events.  Able to verbalize appropriate hypoglycemia management plan. Will continue metformin and glipizide with a focus on improved diet and exercise. Spoke with her about good diet options and provided Transport planner. Written patient instructions provided.  Follow up in  Pharmacist Clinic Visit in 1 month.   Total time in face to face counseling 30 minutes.  Patient seen with Drue Stager PharmD, pharmacy resident.

## 2012-12-24 ENCOUNTER — Encounter: Payer: Self-pay | Admitting: Pharmacist

## 2012-12-24 NOTE — Progress Notes (Signed)
Patient ID: [name], female   DOB: December 11, 1960, 52 y.o.   MRN: [id number] Reviewed: Agree with Dr. Macky Lower documentation and management.

## 2012-12-30 ENCOUNTER — Ambulatory Visit (HOSPITAL_COMMUNITY): Payer: Medicaid Other | Attending: Cardiology | Admitting: Radiology

## 2012-12-30 ENCOUNTER — Encounter: Payer: Self-pay | Admitting: Physician Assistant

## 2012-12-30 DIAGNOSIS — I10 Essential (primary) hypertension: Secondary | ICD-10-CM | POA: Insufficient documentation

## 2012-12-30 DIAGNOSIS — Z6841 Body Mass Index (BMI) 40.0 and over, adult: Secondary | ICD-10-CM | POA: Insufficient documentation

## 2012-12-30 DIAGNOSIS — R072 Precordial pain: Secondary | ICD-10-CM | POA: Insufficient documentation

## 2012-12-30 DIAGNOSIS — E785 Hyperlipidemia, unspecified: Secondary | ICD-10-CM | POA: Insufficient documentation

## 2012-12-30 DIAGNOSIS — R9431 Abnormal electrocardiogram [ECG] [EKG]: Secondary | ICD-10-CM | POA: Insufficient documentation

## 2012-12-30 DIAGNOSIS — R079 Chest pain, unspecified: Secondary | ICD-10-CM

## 2012-12-30 DIAGNOSIS — E119 Type 2 diabetes mellitus without complications: Secondary | ICD-10-CM | POA: Insufficient documentation

## 2012-12-30 NOTE — Progress Notes (Signed)
Echocardiogram performed.  

## 2012-12-31 ENCOUNTER — Ambulatory Visit (HOSPITAL_COMMUNITY): Payer: Medicaid Other | Attending: Cardiology | Admitting: Radiology

## 2012-12-31 VITALS — BP 129/84 | Ht 65.0 in | Wt 273.0 lb

## 2012-12-31 DIAGNOSIS — E785 Hyperlipidemia, unspecified: Secondary | ICD-10-CM | POA: Insufficient documentation

## 2012-12-31 DIAGNOSIS — R0609 Other forms of dyspnea: Secondary | ICD-10-CM | POA: Insufficient documentation

## 2012-12-31 DIAGNOSIS — R0789 Other chest pain: Secondary | ICD-10-CM | POA: Insufficient documentation

## 2012-12-31 DIAGNOSIS — E669 Obesity, unspecified: Secondary | ICD-10-CM | POA: Insufficient documentation

## 2012-12-31 DIAGNOSIS — E119 Type 2 diabetes mellitus without complications: Secondary | ICD-10-CM | POA: Insufficient documentation

## 2012-12-31 DIAGNOSIS — R002 Palpitations: Secondary | ICD-10-CM | POA: Insufficient documentation

## 2012-12-31 DIAGNOSIS — R42 Dizziness and giddiness: Secondary | ICD-10-CM | POA: Insufficient documentation

## 2012-12-31 DIAGNOSIS — R000 Tachycardia, unspecified: Secondary | ICD-10-CM | POA: Insufficient documentation

## 2012-12-31 DIAGNOSIS — I10 Essential (primary) hypertension: Secondary | ICD-10-CM | POA: Insufficient documentation

## 2012-12-31 DIAGNOSIS — R9431 Abnormal electrocardiogram [ECG] [EKG]: Secondary | ICD-10-CM | POA: Insufficient documentation

## 2012-12-31 DIAGNOSIS — R0989 Other specified symptoms and signs involving the circulatory and respiratory systems: Secondary | ICD-10-CM | POA: Insufficient documentation

## 2012-12-31 MED ORDER — TECHNETIUM TC 99M SESTAMIBI GENERIC - CARDIOLITE
33.0000 | Freq: Once | INTRAVENOUS | Status: AC | PRN
  Administered 2012-12-31: 33 via INTRAVENOUS

## 2012-12-31 NOTE — Progress Notes (Signed)
[facility] SITE 3 NUCLEAR MED [address] [place], [place] [address] [phone]    Cardiology Nuclear Med Study  Andrea [name] is a 52 y.o. female     MRN : [id number]     DOB: 1961-01-22  Procedure Date: 12/31/2012  Nuclear Med Background Indication for Stress Test:  Evaluation for Ischemia, Post Hospital- 12/15/12 CP with HTN urgency and Abnormal EKG History:  No prior Cardiac History Cardiac Risk Factors: Hypertension, IDDM Type 2, Lipids and Obesity  Symptoms:  Chest Pain, Dizziness, Palpitations and Rapid HR   Nuclear Pre-Procedure Caffeine/Decaff Intake:  None NPO After: 10:30am   Lungs:  clear O2 Sat: 98% on room air. IV 0.9% NS with Angio Cath:  22g  IV Site: R Hand  IV Started by:  Doyne Keel, CNMT  Chest Size (in):  44 Cup Size: DD  Height: 5\' 5"  (1.651 m)  Weight:  273 lb (123.832 kg)  BMI:  Body mass index is 45.43 kg/(m^2). Tech Comments:  Held metoprolol/HCTZ; CBG 148 at 9:00am    Nuclear Med Study 1 or 2 day study: 2 day  Stress Test Type:  Stress  Reading MD: Cassell Clement, MD  Order Authorizing Provider:  C. Clifton James, MD; Wende Mott, PA-C  Resting Radionuclide: Technetium 74m Sestamibi  Resting Radionuclide Dose: 33.0 mCi on 01/01/13   Stress Radionuclide:  Technetium 12m Sestamibi  Stress Radionuclide Dose: 33.0 mCi on 12/31/12           Stress Protocol Rest HR: 62 Stress HR: 153  Rest BP: 129/84 Stress BP: 148/110  Exercise Time (min): 4:03 METS: 5.4   Predicted Max HR: 168 bpm % Max HR: 91.07 bpm Rate Pressure Product: 21308   Dose of Adenosine (mg):  n/a Dose of Lexiscan: n/a mg  Dose of Atropine (mg): n/a Dose of Dobutamine: n/a mcg/kg/min (at max HR)  Stress Test Technologist: Bonnita Levan, RN  Nuclear Technologist:  Domenic Polite, CNMT     Rest Procedure:  Myocardial perfusion imaging was performed at rest 45 minutes following the intravenous administration of Technetium 85m Sestamibi. Rest ECG: NSR, IVCD, baseline ST  depression inferiorly and anterolaterally.   Stress Procedure:  The patient exercised on the treadmill utilizing the Bruce Protocol for 4:03 minutes. The patient stopped due to dyspnea and fatigue and denied any chest pain.  Technetium 58m Sestamibi was injected at peak exercise and myocardial perfusion imaging was performed after a brief delay. Stress ECG: No significant change from baseline ECG  QPS Raw Data Images:  Normal; no motion artifact; normal heart/lung ratio. Stress Images:  Normal homogeneous uptake in all areas of the myocardium. Rest Images:  Normal homogeneous uptake in all areas of the myocardium. Subtraction (SDS):  There is no evidence of scar or ischemia. Transient Ischemic Dilatation (Normal <1.22):  0.97 Lung/Heart Ratio (Normal <0.45):  0.21  Quantitative Gated Spect Images QGS EDV:  156 ml QGS ESV:  84 ml  Impression Exercise Capacity:  Poor exercise capacity. BP Response:  Hypertensive blood pressure response. Clinical Symptoms:  Dyspnea.  ECG Impression:  NSR with IVCD at baseline.  Patient had baseline anterolateral and inferior ST depression, probably no significant changes with exercise.  Comparison with Prior Nuclear Study: No images to compare  Overall Impression:  Low risk stress nuclear study.  Prominent breast shadow.  No significant perfusion defect so no evidence for ischemia or infarction.  Mild global hypokinesis with EF 46%.    LV Ejection Fraction: 46%.  LV Wall Motion:  Mild global hypokinesis.   Marca Ancona 01/01/2013

## 2013-01-01 ENCOUNTER — Telehealth: Payer: Self-pay | Admitting: Cardiovascular Disease

## 2013-01-01 ENCOUNTER — Ambulatory Visit (INDEPENDENT_AMBULATORY_CARE_PROVIDER_SITE_OTHER): Payer: Medicaid Other | Admitting: Emergency Medicine

## 2013-01-01 ENCOUNTER — Ambulatory Visit (HOSPITAL_COMMUNITY): Payer: Medicaid Other | Attending: Cardiology | Admitting: Radiology

## 2013-01-01 ENCOUNTER — Telehealth: Payer: Self-pay | Admitting: *Deleted

## 2013-01-01 ENCOUNTER — Encounter: Payer: Self-pay | Admitting: Emergency Medicine

## 2013-01-01 VITALS — BP 124/76 | HR 66 | Ht 65.0 in | Wt 272.0 lb

## 2013-01-01 DIAGNOSIS — R42 Dizziness and giddiness: Secondary | ICD-10-CM

## 2013-01-01 DIAGNOSIS — I10 Essential (primary) hypertension: Secondary | ICD-10-CM

## 2013-01-01 DIAGNOSIS — E119 Type 2 diabetes mellitus without complications: Secondary | ICD-10-CM

## 2013-01-01 MED ORDER — FLUTICASONE PROPIONATE 50 MCG/ACT NA SUSP: 2.0000 | Freq: Every day | NASAL | Status: DC

## 2013-01-01 MED ORDER — TECHNETIUM TC 99M SESTAMIBI GENERIC - CARDIOLITE
33.0000 | Freq: Once | INTRAVENOUS | Status: AC | PRN
  Administered 2013-01-01: 33 via INTRAVENOUS

## 2013-01-01 MED ORDER — TRIAMCINOLONE ACETONIDE(NASAL) 55 MCG/ACT NA INHA: 1.0000 | Freq: Every day | NASAL | Status: DC | PRN

## 2013-01-01 NOTE — Assessment & Plan Note (Signed)
Likely related to BP.  Will have her check BP during episode.  Parameters given to call clinic.  Will return if worsening or changing.

## 2013-01-01 NOTE — Assessment & Plan Note (Signed)
Well controlled today.  Continue current medications.  Will have her check her BP during a dizzy spell.  Parameters given to call the office.

## 2013-01-01 NOTE — Patient Instructions (Addendum)
It was nice to see you! Your blood pressure is great today! Please keep taking your medicines - we are not making any changes today. The next time you get dizzy, take your blood pressure.  If it is less than 100/60, please call our office. Follow up in 2 months.

## 2013-01-01 NOTE — Telephone Encounter (Signed)
Will change prescription to fluticasone nasal spray as it is on the preferred list and I do not see where she has been tried on it before.

## 2013-01-01 NOTE — Telephone Encounter (Signed)
Spoke with pt and reviewed echo results with her.  

## 2013-01-01 NOTE — Telephone Encounter (Signed)
PA required for triamcinolone   (Nasacort ) nasal spray. Form given to Dr. Elwyn Reach.

## 2013-01-01 NOTE — Assessment & Plan Note (Signed)
Sugars from home indicate better control than A1c of 15.  Continue current medications.  Follow up in 2 months for A1c and follow up.

## 2013-01-01 NOTE — Telephone Encounter (Signed)
Pt rtn call re test results °

## 2013-01-01 NOTE — Progress Notes (Signed)
  Subjective:    Patient ID: Andrea [name], female    DOB: May 02, 1961, 52 y.o.   MRN: [id number]  HPI Andrea [name] is here for hospital follow up.  Andrea [name] was hospitalized 2/8 to 2/10 for hypertensive urgency.  Since discharge she has been compliant with all her medications.    Hypertension Well controlled: yes  Compliant with medication: yes Side effects from medication: no Check BP at home: yes  BP ranges: some as high as 170s/90s  Chest pain: no Palpitations: no Vision changes: yes - has some trouble reading small print. This is better since discharge Leg edema: no Dizziness: yes  Dizziness Report random episodes of feeling dizzy.  Occur 1-2x per day, but not every day.  Last 5 minutes.  Not associated with standing or head movements.  Has not check BP during an episode.    Diabetes Doing well with metformin and glipizide.  Sugars at home 130-170s.    I have reviewed and updated the following as appropriate: allergies and current medications SHx: never smokers  Review of Systems See hPI    Objective:   Physical Exam BP 124/76  Pulse 66  Ht 5\' 5"  (1.651 m)  Wt 272 lb (123.378 kg)  BMI 45.26 kg/m2  LMP 12/11/2012 Gen: alert, cooperative, NAD HEENT: AT/, sclera white, MMM Neck: supple, no LAD CV: RRR, no murmurs Pulm: CTAB, no wheezes or rales Abd: +BS, soft, NTND Ext: trace edema bilaterally     Assessment & Plan:

## 2013-01-02 ENCOUNTER — Encounter: Payer: Self-pay | Admitting: Physician Assistant

## 2013-01-02 ENCOUNTER — Telehealth: Payer: Self-pay | Admitting: Cardiovascular Disease

## 2013-01-02 ENCOUNTER — Telehealth: Payer: Self-pay | Admitting: *Deleted

## 2013-01-02 NOTE — Telephone Encounter (Signed)
pt notified about stress test results and also EF low but normal, pt verbalized understanding today

## 2013-01-02 NOTE — Telephone Encounter (Signed)
Message copied by Tarri Fuller on Fri Jan 02, 2013 12:51 PM ------      Message from: [place], [place] T      Created: Fri Jan 02, 2013  8:41 AM       Stress test is ok.      EF low but normal by echo, so EF is ok.      Continue with current treatment plan.      Tereso Newcomer, PA-C  8:41 AM 01/02/2013 ------

## 2013-01-02 NOTE — Telephone Encounter (Signed)
Message left on voicemail that medication has been switched.

## 2013-01-02 NOTE — Telephone Encounter (Signed)
pt notified about stress test results and also EF low but normal, pt verbalized understanding today 

## 2013-01-02 NOTE — Telephone Encounter (Signed)
lmom stress ok, EF low but normal per echo per [employer]. PA

## 2013-01-02 NOTE — Telephone Encounter (Signed)
Follow Up    Returning phone call from earlier from [facility].

## 2013-01-22 ENCOUNTER — Ambulatory Visit: Payer: Medicaid Other | Admitting: Pharmacist

## 2013-01-26 ENCOUNTER — Ambulatory Visit (INDEPENDENT_AMBULATORY_CARE_PROVIDER_SITE_OTHER): Payer: Medicaid Other | Admitting: Pharmacist

## 2013-01-26 ENCOUNTER — Encounter: Payer: Self-pay | Admitting: Pharmacist

## 2013-01-26 VITALS — BP 122/79 | HR 59 | Ht 65.5 in | Wt 271.0 lb

## 2013-01-26 DIAGNOSIS — I10 Essential (primary) hypertension: Secondary | ICD-10-CM

## 2013-01-26 NOTE — Progress Notes (Signed)
  Subjective:    Patient ID: Andrea [name], female    DOB: April 22, 1961, 52 y.o.   MRN: [id number]  HPI Patient presents in good spirits for a hypertension follow-up. Was admitted to the ED for hypertensive urgency in Feb 2014. Reports noncompliance with antihypertensive regimen prior to hospital admission, but endorses adherence to current regimen of metoprolol, losartan, HCTZ, amlodipine, and clonidine. Has had dizzy spells in the past, but is stable at presentation.  Walks 30 minutes, 2-3 times a week and maintains a healthy diet. She has lost a considerable amount of weight and is motivated to keep going (ideal weight: 176 lb).   Review of Systems     Objective:   Physical Exam        Assessment & Plan:  Hypertension is currently controlled on therapy based on:  Filed Vitals:   01/26/13 1049  BP: 122/79  Pulse: 59    Continue taking metoprolol 50 mg BID, losartan 50 mg BID, HCTZ 25 mg daily, clonidine 0.3 mg BID, and amlodipine 10 mg daily. Advised patient to keep using home BP monitoring device and to record readings.   Weight loss goal for next visit is 5 pounds. Discussed with patient that if she continues to lose weight and eat right, there is potential to drop one (or more) of her HTN medications.   Written patient instructions provided. Follow-up in the pharmacy clinic in 4-6 weeks. Time seen face-to-face: 30 minutes. Patient seen with Tomi Bamberger PharmD, Pharmacy Resident and Beatrix Shipper, PharmD candidate.

## 2013-01-26 NOTE — Assessment & Plan Note (Signed)
Hypertension is currently controlled on therapy based on:  Filed Vitals:   01/26/13 1049  BP: 122/79  Pulse: 59    Continue taking metoprolol 50 mg BID, losartan 50 mg BID, HCTZ 25 mg daily, clonidine 0.3 mg BID, and amlodipine 10 mg daily. Advised patient to keep using home BP monitoring device and to record readings.   Weight loss goal for next visit is 5 pounds. Discussed with patient that if she continues to lose weight and eat right, there is potential to drop one (or more) of her HTN medications.   Written patient instructions provided. Follow-up in the pharmacy clinic in 4-6 weeks. Time seen face-to-face: 30 minutes. Patient seen with Tomi Bamberger PharmD, Pharmacy Resident and Beatrix Shipper, PharmD candidate.

## 2013-01-26 NOTE — Patient Instructions (Addendum)
It was good to see you today. Thank you for bringing your medication list in with you to clinic.  You have done a great job with your weight loss! Continue to walk 30 minutes 2 to 3 times per week and try and increase the frequency as the weather becomes nicer. Your weight loss goal is 5 pounds for the next visit.   Your blood sugars look great! Continue to check your blood sugars regularly and take all of your medications. If you see blood sugar readings that are less than 100 at any time, please contact the clinic.   Continue to check your blood pressure regularly with your home monitor and record those values. Take all of your medications as directed.  Maintain a healthy diet and limit your intake of candy, chocolate chip cookies, brownies and sweet tea. Make sure to stay well hydrated.   Call next week to set up an appointment with Dr Raymondo Band in May  so we can see how your blood pressure and weight loss has progressed.      Follow-up in the pharmacy clinic in 4-6 weeks.

## 2013-01-27 NOTE — Progress Notes (Signed)
Patient ID: Andrea [name], female   DOB: 1961/09/17, 52 y.o.   MRN: [id number] Reviewed:  Agree with Dr. Macky Lower documentation and management.

## 2013-02-02 ENCOUNTER — Ambulatory Visit: Payer: Medicaid Other | Admitting: Pharmacist

## 2013-03-10 ENCOUNTER — Other Ambulatory Visit: Payer: Self-pay | Admitting: Cardiovascular Disease

## 2013-03-16 ENCOUNTER — Ambulatory Visit: Payer: Self-pay | Admitting: Emergency Medicine

## 2013-03-23 ENCOUNTER — Other Ambulatory Visit: Payer: Self-pay | Admitting: Family Medicine

## 2013-03-23 ENCOUNTER — Other Ambulatory Visit: Payer: Self-pay | Admitting: Cardiovascular Disease

## 2013-03-23 ENCOUNTER — Other Ambulatory Visit: Payer: Self-pay | Admitting: Emergency Medicine

## 2013-04-15 ENCOUNTER — Ambulatory Visit (INDEPENDENT_AMBULATORY_CARE_PROVIDER_SITE_OTHER): Payer: Medicaid Other | Admitting: Family Medicine

## 2013-04-15 VITALS — BP 182/117 | HR 73 | Temp 97.4°F | Wt 262.0 lb

## 2013-04-15 DIAGNOSIS — M25559 Pain in unspecified hip: Secondary | ICD-10-CM

## 2013-04-15 DIAGNOSIS — E119 Type 2 diabetes mellitus without complications: Secondary | ICD-10-CM

## 2013-04-15 DIAGNOSIS — M2170 Unequal limb length (acquired), unspecified site: Secondary | ICD-10-CM

## 2013-04-15 DIAGNOSIS — R079 Chest pain, unspecified: Secondary | ICD-10-CM

## 2013-04-15 DIAGNOSIS — M25552 Pain in left hip: Secondary | ICD-10-CM

## 2013-04-15 MED ORDER — METFORMIN HCL 1000 MG PO TABS: 1000.0000 mg | ORAL_TABLET | Freq: Two times a day (BID) | ORAL | Status: DC

## 2013-04-15 MED ORDER — ATORVASTATIN CALCIUM 40 MG PO TABS: 40.0000 mg | ORAL_TABLET | Freq: Every day | ORAL | Status: DC

## 2013-04-15 NOTE — Progress Notes (Signed)
Andrea [name] is a 52 y.o. female who presents to [facility] today for SD appt for CP and leg pain   CP: started 3 days ago. Stabbing w/ movement from R shoulder to R chest. Improves w/ laying down or sitting in certain positions. Distant h/o heart burn.  Recent hospitalization for CP w/ negative workup in February. Similar occurrence abt 2 years ago. Denies SOB, palpitations, syncope, diaphoresis. Takes Lipitor 40. Walks w/o CP.   L Leg pain: onset last month. Comes and goes. starte in deep anterior thigh and radiates to groin. Makes it difficult to walk. Tylenol w/o relief. Worse w/ ambulation. Denies any loss of sensation or strength in Leg. Denies any trauma to hip. Wt down 40lb since last year. Started walking daily about 2 months ago.   The following portions of the patient's history were reviewed and updated as appropriate: allergies, current medications, past medical history, family and social history, and problem list.  Patient is a nonsmoker.   Past Medical History  Diagnosis Date  . Hypertension   . Diabetes mellitus   . High cholesterol   . Hyperthyroidism     Radioactive iodine ablation  . Environmental allergies   . Hx of echocardiogram     Echo 2/14: mild LVH, EF 55-60%, Gr 1 diast dysfn, mild LAE  . Hx of cardiovascular stress test     a. ETT-MV 2/14:  EF 46% (normal by echo), low risk, no ischemia or scar    ROS as above otherwise neg.    Medications reviewed. Current Outpatient Prescriptions  Medication Sig Dispense Refill  . amLODipine (NORVASC) 10 MG tablet TAKE ONE (1) TABLET EACH DAY  30 tablet  3  . aspirin EC 81 MG tablet Take 81 mg by mouth daily.      Marland Kitchen atorvastatin (LIPITOR) 40 MG tablet Take 1 tablet (40 mg total) by mouth daily.  30 tablet  5  . cloNIDine (CATAPRES) 0.3 MG tablet Take 1 tablet (0.3 mg total) by mouth 2 (two) times daily.  60 tablet  1  . fexofenadine (ALLEGRA) 180 MG tablet Take 180 mg by mouth daily as needed. For seasonal  allergies      . fluticasone (FLONASE) 50 MCG/ACT nasal spray Place 2 sprays into the nose daily.  16 g  6  . glipiZIDE (GLUCOTROL) 10 MG tablet TAKE ONE TABLET TWICE DAILY BEFORE A MEAL.  60 tablet  1  . hydrochlorothiazide (HYDRODIURIL) 25 MG tablet TAKE ONE (1) TABLET EACH DAY  30 tablet  3  . losartan (COZAAR) 100 MG tablet TAKE ONE (1) TABLET EACH DAY  30 tablet  3  . metFORMIN (GLUCOPHAGE) 1000 MG tablet Take 1 tablet (1,000 mg total) by mouth 2 (two) times daily with a meal.  60 tablet  3  . metoprolol (LOPRESSOR) 100 MG tablet Take 0.5 tablets (50 mg total) by mouth 2 (two) times daily.  30 tablet  3  . [DISCONTINUED] sertraline (ZOLOFT) 50 MG tablet Take 50 mg by mouth daily.       No current facility-administered medications for this visit.    Exam:  BP 182/117  Pulse 73  Temp(Src) 97.4 F (36.3 C) (Oral)  Wt 262 lb (118.842 kg)  BMI 42.92 kg/m2 Gen: Well NAD HEENT: EOMI,  MMM MSK: ROM of L leg limited secondary to pain, especially w/ internal and external rotation and flexion at the hip. ttp w/ deep palpation a tthe hip. L leg longer by >1cm than R  leg.   No results found for this or any previous visit (from the past 72 hour(s)).

## 2013-04-15 NOTE — Assessment & Plan Note (Signed)
Likely osteo arthritis vs hip flexor strain Will order xrays NSAIDs Reduce walking Try non wt bearing Try ice though not likely to help due to body habitus Hip exercises given for flexor strain

## 2013-04-15 NOTE — Patient Instructions (Addendum)
Thank you for comingi n today Your chest pain is what we call musculoskeletal and not related to your heart.  I would still like for you to be seen by a cardiologist at some point in the near future Please start taking Motrin 600mg  every 6 hours for the next 2 weeks I think your hip pain is from osteoarthritis or from a muscle strain Please decrease your walking and try to incorporate more non-weight bearing exercise Pelase start doing your exercises as outlined Please come back in 4 weeks if not improving.   Degenerative Arthritis You have osteoarthritis. This is the wear and tear arthritis that comes with aging. It is also called degenerative arthritis. This is common in people past middle age. It is caused by stress on the joints. The large weight bearing joints of the lower extremities are most often affected. The knees, hips, back, neck, and hands can become painful, swollen, and stiff. This is the most common type of arthritis. It comes on with age, carrying too much weight, or from an injury. Treatment includes resting the sore joint until the pain and swelling improve. Crutches or a walker may be needed for severe flares. Only take over-the-counter or prescription medicines for pain, discomfort, or fever as directed by your caregiver. Local heat therapy may improve motion. Cortisone shots into the joint are sometimes used to reduce pain and swelling during flares. Osteoarthritis is usually not crippling and progresses slowly. There are things you can do to decrease pain:  Avoid high impact activities.  Exercise regularly.  Low impact exercises such as walking, biking and swimming help to keep the muscles strong and keep normal joint function.  Stretching helps to keep your range of motion.  Lose weight if you are overweight. This reduces joint stress. In severe cases when you have pain at rest or increasing disability, joint surgery may be helpful. See your caregiver for follow-up  treatment as recommended.  SEEK IMMEDIATE MEDICAL CARE IF:   You have severe joint pain.  Marked swelling and redness in your joint develops.  You develop a high fever. Document Released: 10/22/2005 Document Revised: 01/14/2012 Document Reviewed: 03/24/2007 [facility] [employer] Patient Information 2014 [place], [place].

## 2013-04-15 NOTE — Assessment & Plan Note (Addendum)
Likely MSK pain but w/ risk factors (DM, age, HTN, HLD) Significant recent workup that was negative in February (including Echo, EKG, Trop).  Pt on statin Pt already seeing [facility]

## 2013-04-16 DIAGNOSIS — M2170 Unequal limb length (acquired), unspecified site: Secondary | ICD-10-CM | POA: Insufficient documentation

## 2013-04-16 NOTE — Assessment & Plan Note (Signed)
Wt improving significantly w/ diet and exercise Wt is down >40lbs in past year Cont current regimen

## 2013-04-16 NOTE — Assessment & Plan Note (Signed)
Unequal leg length w/ L >1cm than R Recommended heal lift for R Pt to purchase

## 2013-05-21 ENCOUNTER — Other Ambulatory Visit: Payer: Self-pay | Admitting: Cardiovascular Disease

## 2013-05-27 ENCOUNTER — Ambulatory Visit (INDEPENDENT_AMBULATORY_CARE_PROVIDER_SITE_OTHER): Payer: Medicaid Other | Admitting: Emergency Medicine

## 2013-05-27 ENCOUNTER — Encounter: Payer: Self-pay | Admitting: Emergency Medicine

## 2013-05-27 VITALS — BP 170/98 | HR 71 | Ht 65.5 in | Wt 268.0 lb

## 2013-05-27 DIAGNOSIS — R300 Dysuria: Secondary | ICD-10-CM

## 2013-05-27 DIAGNOSIS — E119 Type 2 diabetes mellitus without complications: Secondary | ICD-10-CM

## 2013-05-27 DIAGNOSIS — I10 Essential (primary) hypertension: Secondary | ICD-10-CM

## 2013-05-27 DIAGNOSIS — R319 Hematuria, unspecified: Secondary | ICD-10-CM | POA: Insufficient documentation

## 2013-05-27 DIAGNOSIS — M25559 Pain in unspecified hip: Secondary | ICD-10-CM

## 2013-05-27 DIAGNOSIS — M25552 Pain in left hip: Secondary | ICD-10-CM

## 2013-05-27 LAB — POCT URINALYSIS DIPSTICK
Bilirubin, UA: NEGATIVE
Ketones, UA: NEGATIVE
Protein, UA: NEGATIVE
Spec Grav, UA: 1.02

## 2013-05-27 LAB — POCT UA - MICROSCOPIC ONLY

## 2013-05-27 MED ORDER — GLIPIZIDE 10 MG PO TABS: 5.0000 mg | ORAL_TABLET | Freq: Two times a day (BID) | ORAL | Status: DC

## 2013-05-27 MED ORDER — CEPHALEXIN 500 MG PO CAPS: 500.0000 mg | ORAL_CAPSULE | Freq: Three times a day (TID) | ORAL | Status: DC

## 2013-05-27 NOTE — Assessment & Plan Note (Signed)
History concerning for infection. UA has blood only. Will send for culture. Treat presumptively with keflex 500mg  TID x3 days. Will repeat UA at f/u to make sure hematuria resolves.

## 2013-05-27 NOTE — Progress Notes (Signed)
  Subjective:    Patient ID: Andrea [name], female    DOB: 08-16-1961, 52 y.o.   MRN: [id number]  HPI Andrea [name] is here for f/u of chronic issues and dysuria.  Diabetes Well controlled: yes Compliant with medications: yes Side effects from medications: no Check sugars at home: yes  Sugar ranges: 100-120 with rare in 200s  Polyuria: no Polydipsia: no Vision changes: yes - due to see her eye doctor Hypoglycemic symptoms: yes shaky and feeling off  Foot exam: done today Eye exam: due; will call her eye doctor for an appt Microalbumin: n/a on ARB  Hypertension Well controlled: no Compliant with medication: yes - typically takes her medication, but does miss a few doses; did not take meds this morning due to rushing. Side effects from medication: no Check BP at home: yes  BP ranges: 130s-190s; she has noticed that it is better when she takes her medications  Chest pain: no Palpitations: no Vision changes: yes - see above Leg edema: no Dizziness: no  Obesity She has been working hard on diet and exercise and has lost a lot of weight over the last year.  Did gain some back in the last month.  This is due to decreased activity secondary to left hip pain.  She was seen by another doctor who ordered x-rays.  She has not gotten these done yet, but will get them in the next week.  Dysuria Reports some burning in her pelvis with urination.  Also foul odor to urine.  Present for about 1 week.  No fevers or chills.  No vaginal bleeding or discharge.  No new sexual partners.  I have reviewed and updated the following as appropriate: allergies and current medications SHx: never smoker  Review of Systems See HPI    Objective:   Physical Exam BP 170/98  Pulse 71  Ht 5' 5.5" (1.664 m)  Wt 268 lb (121.564 kg)  BMI 43.9 kg/m2 Gen: alert, cooperative, NAD HEENT: AT/[place], sclera white, MMM Neck: supple CV: RRR, no murmurs Pulm: CTAB, no wheezes or rales Ext: no edema; 2+ DP  pulses Foot: hyperpigment macules on the bottoms of her feet, no changes; no rashes or lesions; 5/5 bilaterally on monofilament     Assessment & Plan:

## 2013-05-27 NOTE — Patient Instructions (Addendum)
It was nice to see you!  Your blood pressure is elevated today, but it sounds like it is better at home when you take your medications.  We will not make any changes today.  Your diabetes is excellent!  Your A1c is 6.0 today.  We are going to decrease the Glipizide to 1/2 tablet twice a day.  You may have a bladder infection.  Take Keflex 1 tablet 3 times a day for 3 days.   Get the x-rays of your hip and follow up with me next month so we can get you more active again.  We will do a pap smear at this appointment.  Follow up in 3 months for your diabetes and blood pressure.

## 2013-05-27 NOTE — Assessment & Plan Note (Signed)
Overall doing very well with weight loss. Weight is up today due to decreased activity from hip pain. Will work on hip pain to get her active again.

## 2013-05-27 NOTE — Assessment & Plan Note (Signed)
Will get x-rays and f/u with me in August.

## 2013-05-27 NOTE — Assessment & Plan Note (Signed)
A1c excellent at 6.0 today. Given hypoglycemic symptoms will decrease glipizide to 5mg  BID. Continue metformin. Follow up in 3 months. May be able to d/c glipizide if continues to work on diet and exercise.

## 2013-05-27 NOTE — Assessment & Plan Note (Signed)
Elevated today. Home numbers are better per pt report. Missed meds this morning. Continue current medications and will reassess at f/u in 3 months.

## 2013-05-28 ENCOUNTER — Other Ambulatory Visit: Payer: Self-pay

## 2013-05-28 DIAGNOSIS — Z1231 Encounter for screening mammogram for malignant neoplasm of breast: Secondary | ICD-10-CM

## 2013-05-28 LAB — URINE CULTURE
Colony Count: NO GROWTH
Organism ID, Bacteria: NO GROWTH

## 2013-06-01 ENCOUNTER — Telehealth: Payer: Self-pay | Admitting: Emergency Medicine

## 2013-06-01 NOTE — Telephone Encounter (Signed)
The urine culture was negative.  We will repeat the UA at her f/u appt on 8/11 for hematuria.

## 2013-06-01 NOTE — Telephone Encounter (Signed)
Patient is calling for the result from her urinalysis.

## 2013-06-09 ENCOUNTER — Ambulatory Visit
Admission: RE | Admit: 2013-06-09 | Discharge: 2013-06-09 | Disposition: A | Discharge status: Home / Self Care | Payer: Medicaid Other | Source: Ambulatory Visit

## 2013-06-09 DIAGNOSIS — Z1231 Encounter for screening mammogram for malignant neoplasm of breast: Secondary | ICD-10-CM

## 2013-06-15 ENCOUNTER — Ambulatory Visit: Payer: Medicaid Other | Admitting: Emergency Medicine

## 2013-07-06 ENCOUNTER — Other Ambulatory Visit: Payer: Self-pay | Admitting: Family Medicine

## 2013-07-22 ENCOUNTER — Encounter (HOSPITAL_COMMUNITY): Payer: Self-pay | Admitting: Emergency Medicine

## 2013-07-22 ENCOUNTER — Emergency Department (HOSPITAL_COMMUNITY)
Admission: EM | Admit: 2013-07-22 | Discharge: 2013-07-22 | Disposition: A | Discharge status: Home / Self Care | Payer: Medicaid Other | Source: Home / Self Care | Attending: Family Medicine | Admitting: Family Medicine

## 2013-07-22 ENCOUNTER — Other Ambulatory Visit (HOSPITAL_COMMUNITY)
Admission: RE | Admit: 2013-07-22 | Discharge: 2013-07-22 | Disposition: A | Discharge status: Home / Self Care | Payer: Medicaid Other | Source: Ambulatory Visit | Attending: Family Medicine | Admitting: Family Medicine

## 2013-07-22 DIAGNOSIS — Z113 Encounter for screening for infections with a predominantly sexual mode of transmission: Secondary | ICD-10-CM | POA: Insufficient documentation

## 2013-07-22 DIAGNOSIS — R300 Dysuria: Secondary | ICD-10-CM

## 2013-07-22 DIAGNOSIS — R109 Unspecified abdominal pain: Secondary | ICD-10-CM

## 2013-07-22 DIAGNOSIS — N760 Acute vaginitis: Secondary | ICD-10-CM | POA: Insufficient documentation

## 2013-07-22 LAB — POCT URINALYSIS DIP (DEVICE)
Bilirubin Urine: NEGATIVE
Ketones, ur: NEGATIVE mg/dL
pH: 5.5 (ref 5.0–8.0)

## 2013-07-22 MED ORDER — TRAMADOL HCL 50 MG PO TABS: 50.0000 mg | ORAL_TABLET | Freq: Four times a day (QID) | ORAL | Status: DC | PRN

## 2013-07-22 MED ORDER — CEPHALEXIN 500 MG PO CAPS: 500.0000 mg | ORAL_CAPSULE | Freq: Three times a day (TID) | ORAL | Status: DC

## 2013-07-22 NOTE — ED Notes (Signed)
Reports onset of symptoms last night, no itching, no burning, does complain of right lower abdominal pain and pain into right back.

## 2013-07-22 NOTE — ED Provider Notes (Signed)
Andrea [name] is a 52 y.o. female who presents to Urgent Care today for right lower corner and abdominal pain with urinary frequency and urgency. The symptoms are consistent with prior episode of urinary tract infection. Patient denies any vaginal itching or discharge nausea vomiting diarrhea fevers or chills. Her symptoms started yesterday. She feels well otherwise. She's tried Tylenol which helped only a little.   Past Medical History  Diagnosis Date  . Hypertension   . Diabetes mellitus   . High cholesterol   . Hyperthyroidism     Radioactive iodine ablation  . Environmental allergies   . Hx of echocardiogram     Echo 2/14: mild LVH, EF 55-60%, Gr 1 diast dysfn, mild LAE  . Hx of cardiovascular stress test     a. ETT-MV 2/14:  EF 46% (normal by echo), low risk, no ischemia or scar   History  Substance Use Topics  . Smoking status: Never Smoker   . Smokeless tobacco: Never Used  . Alcohol Use: No     Comment: occasional for special occasions   ROS as above Medications reviewed. No current facility-administered medications for this encounter.   Current Outpatient Prescriptions  Medication Sig Dispense Refill  . amLODipine (NORVASC) 10 MG tablet TAKE ONE (1) TABLET EACH DAY  30 tablet  3  . aspirin EC 81 MG tablet Take 81 mg by mouth daily.      Marland Kitchen atorvastatin (LIPITOR) 40 MG tablet Take 1 tablet (40 mg total) by mouth daily.  30 tablet  5  . cephALEXin (KEFLEX) 500 MG capsule Take 1 capsule (500 mg total) by mouth 3 (three) times daily.  21 capsule  0  . cloNIDine (CATAPRES) 0.3 MG tablet TAKE ONE TABLET TWICE DAILY  60 tablet  1  . fexofenadine (ALLEGRA) 180 MG tablet Take 180 mg by mouth daily as needed. For seasonal allergies      . fluticasone (FLONASE) 50 MCG/ACT nasal spray Place 2 sprays into the nose daily.  16 g  6  . glipiZIDE (GLUCOTROL) 10 MG tablet Take 0.5 tablets (5 mg total) by mouth 2 (two) times daily before a meal.  30 tablet  3  . hydrochlorothiazide  (HYDRODIURIL) 25 MG tablet TAKE ONE (1) TABLET EACH DAY  30 tablet  3  . losartan (COZAAR) 100 MG tablet TAKE ONE (1) TABLET EACH DAY  30 tablet  3  . metFORMIN (GLUCOPHAGE) 1000 MG tablet Take 1 tablet (1,000 mg total) by mouth 2 (two) times daily with a meal.  60 tablet  3  . metoprolol (LOPRESSOR) 100 MG tablet TAKE 1/2 TABLET TWICE DAILY  30 tablet  6  . traMADol (ULTRAM) 50 MG tablet Take 1 tablet (50 mg total) by mouth every 6 (six) hours as needed for pain.  15 tablet  0  . [DISCONTINUED] sertraline (ZOLOFT) 50 MG tablet Take 50 mg by mouth daily.        Exam:  BP 137/91  Pulse 61  Temp(Src) 98.2 F (36.8 C) (Oral)  Resp 16  SpO2 100% Gen: Well NAD, obese HEENT: EOMI,  MMM Lungs: CTABL Nl WOB Heart: RRR no MRG Abd: NABS, nondistended. Mildly tender right lower quadrant no rebound or guarding. No CV angle tenderness to percussion Exts: Non edematous BL  LE, warm and well perfused.  GYN: Normal external genitalia. Vaginal canal with thin white discharge. Cervix difficult to visualize due to body habitus.  No cervical motion tenderness no significant adnexal masses  Results for orders  placed during the hospital encounter of 07/22/13 (from the past 24 hour(s))  POCT URINALYSIS DIP (DEVICE)     Status: Abnormal   Collection Time    07/22/13 12:32 PM      Result Value Range   Glucose, UA NEGATIVE  NEGATIVE mg/dL   Bilirubin Urine NEGATIVE  NEGATIVE   Ketones, ur NEGATIVE  NEGATIVE mg/dL   Specific Gravity, Urine 1.020  1.005 - 1.030   Hgb urine dipstick MODERATE (*) NEGATIVE   pH 5.5  5.0 - 8.0   Protein, ur NEGATIVE  NEGATIVE mg/dL   Urobilinogen, UA 0.2  0.0 - 1.0 mg/dL   Nitrite NEGATIVE  NEGATIVE   Leukocytes, UA NEGATIVE  NEGATIVE   No results found.  Assessment and Plan: 52 y.o. female with right lower corner and abdominal pain with hematuria. Possible kidney stone. Possible urinary tract infection with negative dipstick urine. Plan for urine culture. Plan the shoe  with Keflex and tramadol. Followup with primary care provider if not improving Discussed warning signs or symptoms. Please see discharge instructions. Patient expresses understanding.      Rodolph Bong, MD 07/22/13 1300

## 2013-07-23 ENCOUNTER — Telehealth (HOSPITAL_COMMUNITY): Payer: Self-pay | Admitting: Family Medicine

## 2013-07-23 LAB — URINE CULTURE: Culture: NO GROWTH

## 2013-07-23 MED ORDER — METRONIDAZOLE 500 MG PO TABS: 500.0000 mg | ORAL_TABLET | Freq: Two times a day (BID) | ORAL | Status: DC

## 2013-07-23 NOTE — ED Notes (Signed)
Called about BV results.  Called in flagyl  Rodolph Bong, MD 07/23/13 [phone]

## 2013-07-23 NOTE — ED Notes (Addendum)
Affirm: Candida and Trich neg., Gardnerella pos., Urine culture: No growth.  Dr. Denyse Amass has already e-prescribed Flagyl and notified pt.  Vassie Moselle 07/23/2013 GC/Chlamydia neg.

## 2013-07-29 NOTE — ED Notes (Signed)
Lab review

## 2013-08-06 ENCOUNTER — Other Ambulatory Visit: Payer: Self-pay | Admitting: Cardiovascular Disease

## 2013-08-06 ENCOUNTER — Other Ambulatory Visit: Payer: Self-pay | Admitting: Emergency Medicine

## 2013-09-16 ENCOUNTER — Other Ambulatory Visit: Payer: Self-pay | Admitting: Cardiovascular Disease

## 2013-10-20 ENCOUNTER — Other Ambulatory Visit: Payer: Self-pay | Admitting: Cardiovascular Disease

## 2013-11-09 ENCOUNTER — Ambulatory Visit: Payer: Medicaid Other | Admitting: Emergency Medicine

## 2013-11-25 ENCOUNTER — Other Ambulatory Visit: Payer: Self-pay | Admitting: Cardiovascular Disease

## 2013-11-25 ENCOUNTER — Other Ambulatory Visit: Payer: Self-pay | Admitting: Emergency Medicine

## 2013-12-09 ENCOUNTER — Ambulatory Visit: Payer: Medicaid Other | Admitting: Family Medicine

## 2013-12-15 ENCOUNTER — Other Ambulatory Visit: Payer: Self-pay | Admitting: Cardiovascular Disease

## 2013-12-24 ENCOUNTER — Other Ambulatory Visit: Payer: Self-pay | Admitting: Emergency Medicine

## 2013-12-24 ENCOUNTER — Other Ambulatory Visit: Payer: Self-pay | Admitting: Cardiology

## 2014-01-12 ENCOUNTER — Other Ambulatory Visit: Payer: Self-pay | Admitting: Cardiovascular Disease

## 2014-01-26 ENCOUNTER — Other Ambulatory Visit: Payer: Self-pay | Admitting: Emergency Medicine

## 2014-01-28 ENCOUNTER — Telehealth: Payer: Self-pay | Admitting: Emergency Medicine

## 2014-01-28 DIAGNOSIS — E119 Type 2 diabetes mellitus without complications: Secondary | ICD-10-CM

## 2014-01-28 MED ORDER — LOSARTAN POTASSIUM 100 MG PO TABS: 100.0000 mg | ORAL_TABLET | Freq: Every day | ORAL | Status: DC

## 2014-01-28 MED ORDER — METFORMIN HCL 1000 MG PO TABS: 1000.0000 mg | ORAL_TABLET | Freq: Two times a day (BID) | ORAL | Status: DC

## 2014-01-28 MED ORDER — ATORVASTATIN CALCIUM 40 MG PO TABS: 40.0000 mg | ORAL_TABLET | Freq: Every day | ORAL | Status: DC

## 2014-01-28 MED ORDER — AMLODIPINE BESYLATE 10 MG PO TABS: 10.0000 mg | ORAL_TABLET | Freq: Every day | ORAL | Status: DC

## 2014-01-28 MED ORDER — CLONIDINE HCL 0.3 MG PO TABS: 0.3000 mg | ORAL_TABLET | Freq: Two times a day (BID) | ORAL | Status: DC

## 2014-01-28 MED ORDER — GLIPIZIDE 10 MG PO TABS: 10.0000 mg | ORAL_TABLET | Freq: Every day | ORAL | Status: DC

## 2014-01-28 MED ORDER — METOPROLOL TARTRATE 100 MG PO TABS: 50.0000 mg | ORAL_TABLET | Freq: Two times a day (BID) | ORAL | Status: DC

## 2014-01-28 NOTE — Telephone Encounter (Signed)
I have sent in 1 month supplies of her medications.  She needs to schedule an appointment for additional refills.

## 2014-01-28 NOTE — Telephone Encounter (Signed)
Pt notified.  Andrea [name] L, CMA  

## 2014-01-28 NOTE — Telephone Encounter (Signed)
Need refills on all her meds.  Aware she may have to be seen before this can take place.

## 2014-01-28 NOTE — Telephone Encounter (Signed)
Called pt. LMVM to call back. Please see message below. Thanks. Javier Glazier, Gerrit Heck

## 2014-02-10 ENCOUNTER — Ambulatory Visit (INDEPENDENT_AMBULATORY_CARE_PROVIDER_SITE_OTHER): Payer: Medicaid Other | Admitting: Emergency Medicine

## 2014-02-10 ENCOUNTER — Encounter: Payer: Self-pay | Admitting: Emergency Medicine

## 2014-02-10 VITALS — BP 159/90 | HR 62 | Ht 65.5 in | Wt 288.0 lb

## 2014-02-10 DIAGNOSIS — E119 Type 2 diabetes mellitus without complications: Secondary | ICD-10-CM

## 2014-02-10 DIAGNOSIS — R319 Hematuria, unspecified: Secondary | ICD-10-CM

## 2014-02-10 DIAGNOSIS — I10 Essential (primary) hypertension: Secondary | ICD-10-CM

## 2014-02-10 LAB — POCT URINALYSIS DIPSTICK
BILIRUBIN UA: NEGATIVE
GLUCOSE UA: NEGATIVE
KETONES UA: NEGATIVE
Nitrite, UA: POSITIVE
PROTEIN UA: NEGATIVE
SPEC GRAV UA: 1.02
Urobilinogen, UA: 1
pH, UA: 6.5

## 2014-02-10 LAB — POCT UA - MICROSCOPIC ONLY

## 2014-02-10 LAB — COMPLETE METABOLIC PANEL WITH GFR
ALBUMIN: 4.3 g/dL (ref 3.5–5.2)
ALK PHOS: 60 U/L (ref 39–117)
ALT: 15 U/L (ref 0–35)
AST: 13 U/L (ref 0–37)
BILIRUBIN TOTAL: 0.5 mg/dL (ref 0.2–1.2)
BUN: 18 mg/dL (ref 6–23)
CO2: 28 meq/L (ref 19–32)
Calcium: 10.3 mg/dL (ref 8.4–10.5)
Chloride: 103 mEq/L (ref 96–112)
Creat: 1.16 mg/dL — ABNORMAL HIGH (ref 0.50–1.10)
GFR, EST AFRICAN AMERICAN: 62 mL/min
GFR, EST NON AFRICAN AMERICAN: 54 mL/min — AB
GLUCOSE: 159 mg/dL — AB (ref 70–99)
Potassium: 4 mEq/L (ref 3.5–5.3)
SODIUM: 139 meq/L (ref 135–145)
TOTAL PROTEIN: 7.5 g/dL (ref 6.0–8.3)

## 2014-02-10 LAB — POCT GLYCOSYLATED HEMOGLOBIN (HGB A1C): Hemoglobin A1C: 6.8

## 2014-02-10 MED ORDER — CLONIDINE HCL 0.3 MG PO TABS: 0.3000 mg | ORAL_TABLET | Freq: Two times a day (BID) | ORAL | Status: DC

## 2014-02-10 MED ORDER — HYDROCHLOROTHIAZIDE 25 MG PO TABS: 25.0000 mg | ORAL_TABLET | Freq: Every day | ORAL | Status: DC

## 2014-02-10 MED ORDER — SENNOSIDES-DOCUSATE SODIUM 8.6-50 MG PO TABS: 2.0000 | ORAL_TABLET | Freq: Every evening | ORAL | Status: DC | PRN

## 2014-02-10 MED ORDER — METFORMIN HCL 1000 MG PO TABS: 1000.0000 mg | ORAL_TABLET | Freq: Two times a day (BID) | ORAL | Status: DC

## 2014-02-10 MED ORDER — FLUTICASONE PROPIONATE 50 MCG/ACT NA SUSP: 2.0000 | Freq: Every day | NASAL | Status: DC

## 2014-02-10 MED ORDER — MONTELUKAST SODIUM 10 MG PO TABS: 10.0000 mg | ORAL_TABLET | Freq: Every day | ORAL | Status: DC

## 2014-02-10 MED ORDER — AMLODIPINE BESYLATE 10 MG PO TABS: 10.0000 mg | ORAL_TABLET | Freq: Every day | ORAL | Status: DC

## 2014-02-10 MED ORDER — GLIPIZIDE 10 MG PO TABS: 10.0000 mg | ORAL_TABLET | Freq: Every day | ORAL | Status: DC

## 2014-02-10 MED ORDER — METOPROLOL TARTRATE 100 MG PO TABS: 50.0000 mg | ORAL_TABLET | Freq: Two times a day (BID) | ORAL | Status: DC

## 2014-02-10 MED ORDER — ATORVASTATIN CALCIUM 40 MG PO TABS: 40.0000 mg | ORAL_TABLET | Freq: Every day | ORAL | Status: DC

## 2014-02-10 MED ORDER — LOSARTAN POTASSIUM 100 MG PO TABS: 100.0000 mg | ORAL_TABLET | Freq: Every day | ORAL | Status: DC

## 2014-02-10 MED ORDER — FEXOFENADINE HCL 180 MG PO TABS: 180.0000 mg | ORAL_TABLET | Freq: Every day | ORAL | Status: DC | PRN

## 2014-02-10 NOTE — Assessment & Plan Note (Signed)
A1c 6.8%. Continue glipizide and metformin. Eye appt scheduled for next week. Discussed weight loss via diet and exercise.

## 2014-02-10 NOTE — Assessment & Plan Note (Addendum)
Elevated today.  Home numbers are up and down. Will continue current medications and recheck in 2 weeks.

## 2014-02-10 NOTE — Patient Instructions (Signed)
It was nice to see you!  Your A1c is good at 6.8%.  Your blood pressure is a little high today, but your home numbers are better. We are not going to change any medicines today.  Your urine continues to have some blood in it.   I am going to culture your urine.  Depending on the results, I will call you to let you know the next step.  Follow up in 2 weeks to recheck your blood pressure.

## 2014-02-10 NOTE — Progress Notes (Signed)
   Subjective:    Patient ID: Andrea [name], female    DOB: [birth year], 53 y.o.   MRN: [id number]  HPI Andrea [name] is here for f/u dm and htn.  Diabetes Compliant with medications: yes Side effects from medications: no Check sugars at home: yes  Sugar ranges: 110 - 220  Polyuria: no Polydipsia: no Vision changes: yes - reports blurry vision and floaters intermittently Hypoglycemic symptoms: no  Eye exam: appt next week Microalbumin: n/a on arb  Hypertension Compliant with medication: yes Side effects from medication: no Check BP at home: yes  BP ranges: 110s/60s - 170s/100s  Low salt diet: yes Exercise: no - was doing very well with walking, but her knees are starting to bother her  Chest pain: no Palpitations: no Vision changes: no Leg edema: no Dizziness: no  Hematuria Patient denies any urinary complaints today, including hematuria, dysuria, suprapubic pain, urgency, frequency.   Current Outpatient Prescriptions on File Prior to Visit  Medication Sig Dispense Refill  . aspirin EC 81 MG tablet Take 81 mg by mouth daily.      . [DISCONTINUED] sertraline (ZOLOFT) 50 MG tablet Take 50 mg by mouth daily.       No current facility-administered medications on file prior to visit.    I have reviewed and updated the following as appropriate: allergies and current medications SHx: non smoker   Review of Systems See HPI    Objective:   Physical Exam BP 159/90  Pulse 62  Ht 5' 5.5" (1.664 m)  Wt 288 lb (130.636 kg)  BMI 47.18 kg/m2 Gen: alert, cooperative, NAD HEENT: AT/[place], sclera white, MMM Neck: supple, no LAD CV: RRR, no murmurs Pulm: CTAB, no wheezes or rales Ext: no edema     Assessment & Plan:

## 2014-02-10 NOTE — Assessment & Plan Note (Signed)
Multiple UAs in last year positive for blood.  She has been treated for infection twice, once culture was negative. Urine today with blood, nitrites and LE but asymptomatic. Will send for culture.  If culture positive, will treat and recheck urine.  If culture negative, will refer to Urology for evaluation of hematuria.

## 2014-02-12 ENCOUNTER — Telehealth: Payer: Self-pay | Admitting: Emergency Medicine

## 2014-02-12 DIAGNOSIS — N390 Urinary tract infection, site not specified: Secondary | ICD-10-CM

## 2014-02-12 LAB — URINE CULTURE: Colony Count: >=100000

## 2014-02-12 MED ORDER — CEPHALEXIN 500 MG PO CAPS: 500.0000 mg | ORAL_CAPSULE | Freq: Four times a day (QID) | ORAL | Status: DC

## 2014-02-12 NOTE — Telephone Encounter (Signed)
Called and informed patient of positive culture.  Keflex QID x3 days sent to pharmacy.  Patient has f/u in 2 weeks.  Will need to recheck urine to make sure hematuria has resolved.

## 2014-02-22 ENCOUNTER — Inpatient Hospital Stay (HOSPITAL_COMMUNITY)
Admission: AD | Admit: 2014-02-22 | Discharge: 2014-02-23 | Disposition: A | Discharge status: Home / Self Care | Payer: Medicaid Other | Source: Ambulatory Visit | Attending: Obstetrics & Gynecology | Admitting: Obstetrics & Gynecology

## 2014-02-22 ENCOUNTER — Inpatient Hospital Stay (HOSPITAL_COMMUNITY): Payer: Medicaid Other

## 2014-02-22 ENCOUNTER — Encounter (HOSPITAL_COMMUNITY): Payer: Self-pay | Admitting: *Deleted

## 2014-02-22 DIAGNOSIS — E7800 Pure hypercholesterolemia, unspecified: Secondary | ICD-10-CM | POA: Insufficient documentation

## 2014-02-22 DIAGNOSIS — E119 Type 2 diabetes mellitus without complications: Secondary | ICD-10-CM | POA: Insufficient documentation

## 2014-02-22 DIAGNOSIS — N920 Excessive and frequent menstruation with regular cycle: Secondary | ICD-10-CM | POA: Insufficient documentation

## 2014-02-22 DIAGNOSIS — N938 Other specified abnormal uterine and vaginal bleeding: Secondary | ICD-10-CM | POA: Insufficient documentation

## 2014-02-22 DIAGNOSIS — N949 Unspecified condition associated with female genital organs and menstrual cycle: Secondary | ICD-10-CM | POA: Insufficient documentation

## 2014-02-22 DIAGNOSIS — D259 Leiomyoma of uterus, unspecified: Secondary | ICD-10-CM | POA: Insufficient documentation

## 2014-02-22 DIAGNOSIS — I10 Essential (primary) hypertension: Secondary | ICD-10-CM | POA: Insufficient documentation

## 2014-02-22 DIAGNOSIS — E0590 Thyrotoxicosis, unspecified without thyrotoxic crisis or storm: Secondary | ICD-10-CM | POA: Insufficient documentation

## 2014-02-22 DIAGNOSIS — D219 Benign neoplasm of connective and other soft tissue, unspecified: Secondary | ICD-10-CM

## 2014-02-22 LAB — CBC
HEMATOCRIT: 34.9 % — AB (ref 36.0–46.0)
HEMOGLOBIN: 11.9 g/dL — AB (ref 12.0–15.0)
MCH: 28.3 pg (ref 26.0–34.0)
MCHC: 34.1 g/dL (ref 30.0–36.0)
MCV: 82.9 fL (ref 78.0–100.0)
Platelets: 311 10*3/uL (ref 150–400)
RBC: 4.21 MIL/uL (ref 3.87–5.11)
RDW: 14.1 % (ref 11.5–15.5)
WBC: 8.5 10*3/uL (ref 4.0–10.5)

## 2014-02-22 LAB — WET PREP, GENITAL
Clue Cells Wet Prep HPF POC: NONE SEEN
Trich, Wet Prep: NONE SEEN
Yeast Wet Prep HPF POC: NONE SEEN

## 2014-02-22 NOTE — MAU Note (Addendum)
PT SAYS SHE STARTED VAG BLEEDING ON 4-11  AND IS STILL BLEEDING-  LIGHT  THEN HEAVY-  ALSO    BLOOD CLOTS-  GOLF BALL SIZE   . NO CYCLE IN [facility].    IN FEB- BLEEDING LAST ED 14 DAYS.    DR NEWELL DEL [facility]IES.   SO NO DR.    IN TRIAGE - PAD-   NONE.Marland Kitchen    FEW CRAMPS- STARTED    4-6-   SOMETIMES TAKES MORTIN.Marland Kitchen  LAST SEX-   MARCH.  BIRTH CONTROL-   CONDOMS    WAS TOLD  SHE HAS 3 FIBROIDS-   2002.

## 2014-02-22 NOTE — MAU Provider Note (Signed)
History     CSN: [id number]  Arrival date and time: 02/22/14 2230   First Provider Initiated Contact with Patient 02/22/14 2321      No chief complaint on file.  HPI  Andrea [name] is a 53 y.o. [phone] who presents today with heavy bleeding. She states that her period started nine days ago, and has continued. She has been having normal periods up until now. She denies any pain or vaginal discharge. She states that she does not have a GYN, but goes to the [facility] clinic for her care. She states that she last had a pap in 2010, and as far as she knows they have all been normal.   Past Medical History  Diagnosis Date  . Hypertension   . Diabetes mellitus   . High cholesterol   . Hyperthyroidism     Radioactive iodine ablation  . Environmental allergies   . Hx of echocardiogram     Echo 2/14: mild LVH, EF 55-60%, Gr 1 diast dysfn, mild LAE  . Hx of cardiovascular stress test     a. ETT-MV 2/14:  EF 46% (normal by echo), low risk, no ischemia or scar    Past Surgical History  Procedure Laterality Date  . Cesarean section      x2    Family History  Problem Relation Age of Onset  . Pneumonia Mother   . Diabetes Mother   . Hypertension Father   . Hypertension Sister   . Hyperlipidemia Sister   . Hypertension Brother   . Hypertension Maternal Aunt   . Hypertension Paternal Aunt   . Cancer Paternal Aunt     throat cancer  . Diabetes Maternal Grandmother   . Diabetes Paternal Grandmother   . Cancer Paternal Grandmother     pancreatic cancer    History  Substance Use Topics  . Smoking status: Never Smoker   . Smokeless tobacco: Never Used  . Alcohol Use: No     Comment: occasional for special occasions    Allergies: No Known Allergies  Prescriptions prior to admission  Medication Sig Dispense Refill  . amLODipine (NORVASC) 10 MG tablet Take 1 tablet (10 mg total) by mouth daily.  90 tablet  3  . aspirin EC 81 MG tablet Take 81 mg by mouth daily.      Marland Kitchen  atorvastatin (LIPITOR) 40 MG tablet Take 1 tablet (40 mg total) by mouth daily.  90 tablet  3  . cephALEXin (KEFLEX) 500 MG capsule Take 1 capsule (500 mg total) by mouth 4 (four) times daily.  12 capsule  0  . cloNIDine (CATAPRES) 0.3 MG tablet Take 1 tablet (0.3 mg total) by mouth 2 (two) times daily.  180 tablet  3  . fexofenadine (ALLEGRA) 180 MG tablet Take 1 tablet (180 mg total) by mouth daily as needed. For seasonal allergies  90 tablet  3  . fluticasone (FLONASE) 50 MCG/ACT nasal spray Place 2 sprays into both nostrils daily.  16 g  6  . glipiZIDE (GLUCOTROL) 10 MG tablet Take 1 tablet (10 mg total) by mouth daily before breakfast.  90 tablet  3  . hydrochlorothiazide (HYDRODIURIL) 25 MG tablet Take 1 tablet (25 mg total) by mouth daily.  90 tablet  3  . losartan (COZAAR) 100 MG tablet Take 1 tablet (100 mg total) by mouth daily.  90 tablet  3  . metFORMIN (GLUCOPHAGE) 1000 MG tablet Take 1 tablet (1,000 mg total) by mouth 2 (two) times daily with  a meal.  180 tablet  3  . metoprolol (LOPRESSOR) 100 MG tablet Take 0.5 tablets (50 mg total) by mouth 2 (two) times daily.  90 tablet  3  . montelukast (SINGULAIR) 10 MG tablet Take 1 tablet (10 mg total) by mouth at bedtime.  30 tablet  6  . senna-docusate (SENOKOT-S) 8.6-50 MG per tablet Take 2 tablets by mouth at bedtime as needed for mild constipation.  60 tablet  1    ROS Physical Exam   Blood pressure 152/88, pulse 77, temperature 97.5 F (36.4 C), temperature source Oral, resp. rate 20, height 5\' 4"  (1.626 m), weight 134.038 kg (295 lb 8 oz).  Physical Exam  Nursing note and vitals reviewed. Constitutional: She is oriented to person, place, and time. She appears well-developed and well-nourished. No distress.  Cardiovascular: Normal rate.   Respiratory: Effort normal.  GI: Soft. There is no tenderness. There is no rebound.  Genitourinary:   External: no lesion Vagina: small amount of blood seen  Cervix: pink, smooth, no  CMT Uterus: NSSC Adnexa: difficult to assess 2/2 body habitus    Neurological: She is alert and oriented to person, place, and time.  Skin: Skin is warm and dry.  Psychiatric: She has a normal mood and affect.    MAU Course  Procedures  Results for orders placed during the hospital encounter of 02/22/14 (from the past 24 hour(s))  CBC     Status: Abnormal   Collection Time    02/22/14 11:02 PM      Result Value Ref Range   WBC 8.5  4.0 - 10.5 K/uL   RBC 4.21  3.87 - 5.11 MIL/uL   Hemoglobin 11.9 (*) 12.0 - 15.0 g/dL   HCT 34.9 (*) 36.0 - 46.0 %   MCV 82.9  78.0 - 100.0 fL   MCH 28.3  26.0 - 34.0 pg   MCHC 34.1  30.0 - 36.0 g/dL   RDW 14.1  11.5 - 15.5 %   Platelets 311  150 - 400 K/uL  WET PREP, GENITAL     Status: Abnormal   Collection Time    02/22/14 11:30 PM      Result Value Ref Range   Yeast Wet Prep HPF POC NONE SEEN  NONE SEEN   Trich, Wet Prep NONE SEEN  NONE SEEN   Clue Cells Wet Prep HPF POC NONE SEEN  NONE SEEN   WBC, Wet Prep HPF POC FEW (*) NONE SEEN   US Transvaginal Non-ob  02/23/2014   CLINICAL DATA:  Abnormal uterine bleeding  EXAM: TRANSABDOMINAL AND TRANSVAGINAL ULTRASOUND OF PELVIS  TECHNIQUE: Both transabdominal and transvaginal ultrasound examinations of the pelvis were performed. Transabdominal technique was performed for global imaging of the pelvis including uterus, ovaries, adnexal regions, and pelvic cul-de-sac. It was necessary to proceed with endovaginal exam following the transabdominal exam to visualize the endometrium.  COMPARISON:  None  FINDINGS: Uterus  Measurements: 10.6 x 7.2 x 7.6 cm. 0.3 x 4.0 x 4.5 cm posterior fundal hyperechoic fibroid. 2.2 x 1.9 x 1.7 cm lower uterine fibroid.  Endometrium  Thickness: 8 mm and uniform.  No focal abnormality visualized.  Right ovary  Measurements: Not visualized.  Left ovary  Measurements: 3.1 x 2.1 x 2.7 cm. Normal appearance/no adnexal mass.  Other findings  No free fluid.  IMPRESSION: Right ovary was  not visualized.  Uterine fibroids.   Electronically Signed   By: Maryclare Bean M.D.   On: 02/23/2014 00:16   US  Pelvis Complete  02/23/2014   CLINICAL DATA:  Abnormal uterine bleeding  EXAM: TRANSABDOMINAL AND TRANSVAGINAL ULTRASOUND OF PELVIS  TECHNIQUE: Both transabdominal and transvaginal ultrasound examinations of the pelvis were performed. Transabdominal technique was performed for global imaging of the pelvis including uterus, ovaries, adnexal regions, and pelvic cul-de-sac. It was necessary to proceed with endovaginal exam following the transabdominal exam to visualize the endometrium.  COMPARISON:  None  FINDINGS: Uterus  Measurements: 10.6 x 7.2 x 7.6 cm. 0.3 x 4.0 x 4.5 cm posterior fundal hyperechoic fibroid. 2.2 x 1.9 x 1.7 cm lower uterine fibroid.  Endometrium  Thickness: 8 mm and uniform.  No focal abnormality visualized.  Right ovary  Measurements: Not visualized.  Left ovary  Measurements: 3.1 x 2.1 x 2.7 cm. Normal appearance/no adnexal mass.  Other findings  No free fluid.  IMPRESSION: Right ovary was not visualized.  Uterine fibroids.   Electronically Signed   By: Maryclare Bean M.D.   On: 02/23/2014 00:16     Assessment and Plan   1. Fibroids   2. Menorrhagia    Likely perimenopausal FU in GYN clinic for routine care   Mathis Bud 02/22/2014, 11:29 PM

## 2014-02-23 DIAGNOSIS — D259 Leiomyoma of uterus, unspecified: Secondary | ICD-10-CM

## 2014-02-23 LAB — GC/CHLAMYDIA PROBE AMP
CT Probe RNA: NEGATIVE
GC Probe RNA: NEGATIVE

## 2014-02-23 NOTE — Discharge Instructions (Signed)
Fibroids Fibroids are lumps (tumors) that can occur any place in a woman's body. These lumps are not cancerous. Fibroids vary in size, weight, and where they grow. HOME CARE  Do not take aspirin.  Write down the number of pads or tampons you use during your period. Tell your doctor. This can help determine the best treatment for you. GET HELP RIGHT AWAY IF:  You have pain in your lower belly (abdomen) that is not helped with medicine.  You have cramps that are not helped with medicine.  You have more bleeding between or during your period.  You feel lightheaded or pass out (faint).  Your lower belly pain gets worse. MAKE SURE YOU:  Understand these instructions.  Will watch your condition.  Will get help right away if you are not doing well or get worse. Document Released: 11/24/2010 Document Revised: 01/14/2012 Document Reviewed: 11/24/2010 [facility] Patient Information 2014 [place], [place].  Perimenopause Perimenopause is the time when your body begins to move into the menopause (no menstrual period for 12 straight months). It is a natural process. Perimenopause can begin 2 8 years before the menopause and usually lasts for 1 year after the menopause. During this time, your ovaries may or may not produce an egg. The ovaries vary in their production of estrogen and progesterone hormones each month. This can cause irregular menstrual periods, difficulty getting pregnant, vaginal bleeding between periods, and uncomfortable symptoms. CAUSES  Irregular production of the ovarian hormones, estrogen and progesterone, and not ovulating every month.  Other causes include:  Tumor of the pituitary gland in the brain.  Medical disease that affects the ovaries.  Radiation treatment.  Chemotherapy.  Unknown causes.  Heavy smoking and excessive alcohol intake can bring on perimenopause sooner. SIGNS AND SYMPTOMS   Hot flashes.  Night sweats.  Irregular menstrual  periods.  Decreased sex drive.  Vaginal dryness.  Headaches.  Mood swings.  Depression.  Memory problems.  Irritability.  Tiredness.  Weight gain.  Trouble getting pregnant.  The beginning of losing bone cells (osteoporosis).  The beginning of hardening of the arteries (atherosclerosis). DIAGNOSIS  Your health care provider will make a diagnosis by analyzing your age, menstrual history, and symptoms. He or she will do a physical exam and note any changes in your body, especially your female organs. Female hormone tests may or may not be helpful depending on the amount of female hormones you produce and when you produce them. However, other hormone tests may be helpful to rule out other problems. TREATMENT  In some cases, no treatment is needed. The decision on whether treatment is necessary during the perimenopause should be made by you and your health care provider based on how the symptoms are affecting you and your lifestyle. Various treatments are available, such as:  Treating individual symptoms with a specific medicine for that symptom.  Herbal medicines that can help specific symptoms.  Counseling.  Group therapy. HOME CARE INSTRUCTIONS   Keep track of your menstrual periods (when they occur, how heavy they are, how long between periods, and how long they last) as well as your symptoms and when they started.  Only take over-the-counter or prescription medicines as directed by your health care provider.  Sleep and rest.  Exercise.  Eat a diet that contains calcium (good for your bones) and soy (acts like the estrogen hormone).  Do not smoke.  Avoid alcoholic beverages.  Take vitamin supplements as recommended by your health care provider. Taking vitamin E may help  in certain cases.  Take calcium and vitamin D supplements to help prevent bone loss.  Group therapy is sometimes helpful.  Acupuncture may help in some cases. SEEK MEDICAL CARE IF:   You  have questions about any symptoms you are having.  You need a referral to a specialist (gynecologist, psychiatrist, or psychologist). SEEK IMMEDIATE MEDICAL CARE IF:   You have vaginal bleeding.  Your period lasts longer than 8 days.  Your periods are recurring sooner than 21 days.  You have bleeding after intercourse.  You have severe depression.  You have pain when you urinate.  You have severe headaches.  You have vision problems. Document Released: 11/29/2004 Document Revised: 08/12/2013 Document Reviewed: 05/21/2013 [facility] Patient Information 2014 [place], [place].

## 2014-02-23 NOTE — MAU Provider Note (Signed)
   Attestation of Attending Supervision of Advanced Practitioner (CNM/NP): Evaluation and management procedures were performed by the Advanced Practitioner under my supervision and collaboration. I have reviewed the Advanced Practitioner's note and chart, and I agree with the management and plan.  Fredderick Phenix Wandy Bossler 8:03 AM

## 2014-02-25 ENCOUNTER — Ambulatory Visit: Payer: Medicaid Other | Admitting: Emergency Medicine

## 2014-02-25 ENCOUNTER — Other Ambulatory Visit: Payer: Self-pay | Admitting: Emergency Medicine

## 2014-03-23 ENCOUNTER — Other Ambulatory Visit: Payer: Self-pay | Admitting: Emergency Medicine

## 2014-04-12 ENCOUNTER — Other Ambulatory Visit: Payer: Self-pay | Admitting: *Deleted

## 2014-04-12 MED ORDER — SENNOSIDES-DOCUSATE SODIUM 8.6-50 MG PO TABS: 2.0000 | ORAL_TABLET | Freq: Every evening | ORAL | Status: DC | PRN

## 2014-04-14 ENCOUNTER — Other Ambulatory Visit (HOSPITAL_COMMUNITY)
Admission: RE | Admit: 2014-04-14 | Discharge: 2014-04-14 | Disposition: A | Discharge status: Home / Self Care | Payer: Medicaid Other | Source: Ambulatory Visit | Attending: Obstetrics & Gynecology | Admitting: Obstetrics & Gynecology

## 2014-04-14 ENCOUNTER — Ambulatory Visit (INDEPENDENT_AMBULATORY_CARE_PROVIDER_SITE_OTHER): Payer: Medicaid Other | Admitting: Obstetrics & Gynecology

## 2014-04-14 ENCOUNTER — Encounter: Payer: Self-pay | Admitting: Obstetrics & Gynecology

## 2014-04-14 VITALS — BP 142/83 | HR 62 | Temp 98.1°F | Ht 65.0 in | Wt 292.5 lb

## 2014-04-14 DIAGNOSIS — Z0000 Encounter for general adult medical examination without abnormal findings: Secondary | ICD-10-CM | POA: Diagnosis not present

## 2014-04-14 DIAGNOSIS — D219 Benign neoplasm of connective and other soft tissue, unspecified: Secondary | ICD-10-CM

## 2014-04-14 DIAGNOSIS — D259 Leiomyoma of uterus, unspecified: Secondary | ICD-10-CM

## 2014-04-14 DIAGNOSIS — Z113 Encounter for screening for infections with a predominantly sexual mode of transmission: Secondary | ICD-10-CM

## 2014-04-14 DIAGNOSIS — N939 Abnormal uterine and vaginal bleeding, unspecified: Secondary | ICD-10-CM | POA: Diagnosis not present

## 2014-04-14 DIAGNOSIS — Z01419 Encounter for gynecological examination (general) (routine) without abnormal findings: Secondary | ICD-10-CM | POA: Insufficient documentation

## 2014-04-14 DIAGNOSIS — N926 Irregular menstruation, unspecified: Secondary | ICD-10-CM

## 2014-04-14 DIAGNOSIS — N760 Acute vaginitis: Secondary | ICD-10-CM | POA: Insufficient documentation

## 2014-04-14 DIAGNOSIS — Z1151 Encounter for screening for human papillomavirus (HPV): Secondary | ICD-10-CM | POA: Insufficient documentation

## 2014-04-14 DIAGNOSIS — Z1231 Encounter for screening mammogram for malignant neoplasm of breast: Secondary | ICD-10-CM

## 2014-04-14 DIAGNOSIS — E119 Type 2 diabetes mellitus without complications: Secondary | ICD-10-CM

## 2014-04-14 NOTE — Progress Notes (Signed)
GYNECOLOGY CLINIC ANNUAL PREVENTATIVE CARE ENCOUNTER NOTE  Subjective:     Andrea [name] is a 53 y.o. [phone] female here for a routine annual gynecologic exam.  Current complaints: had heavy bleeding episode in 02/2014 for which she was seen in [facility].  It was thought to be perimenopausal bleeding given the irregular nature of the bleeding reported. She denies any further bleeding since this episode.  Just reports occasional cramping.  She desires STI testing.   02/23/2014  TRANSABDOMINAL AND TRANSVAGINAL ULTRASOUND OF PELVIS CLINICAL DATA: Abnormal uterine bleeding  TECHNIQUE: Both transabdominal and transvaginal ultrasound examinations of the pelvis were performed. Transabdominal technique was performed for global imaging of the pelvis including uterus, ovaries, adnexal regions, and pelvic cul-de-sac. It was necessary to proceed with endovaginal exam following the transabdominal exam to visualize the endometrium. COMPARISON: None FINDINGS: Uterus Measurements: 10.6 x 7.2 x 7.6 cm. 0.3 x 4.0 x 4.5 cm posterior fundal hyperechoic fibroid. 2.2 x 1.9 x 1.7 cm lower uterine fibroid. Endometrium Thickness: 8 mm and uniform. No focal abnormality visualized. Right ovary Measurements: Not visualized. Left ovary Measurements: 3.1 x 2.1 x 2.7 cm. Normal appearance/no adnexal mass. Other findings No free fluid. IMPRESSION: Right ovary was not visualized. Uterine fibroids. Electronically Signed By: Maryclare Bean M.D. On: 02/23/2014 00:16   Gynecologic History No LMP recorded. Patient is not currently having periods (Reason: Perimenopausal). Contraception: none Last Pap: many years ago. Results were: normal Last mammogram: 06/09/2013. Results were: normal  Obstetric History OB History  Gravida Para Term Preterm AB SAB TAB Ectopic Multiple Living  4 3 3  1 1    3     # Outcome Date GA Lbr Len/2nd Weight Sex Delivery Anes PTL Lv  4 SAB           3 TRM     M LTCS   Y  2 TRM     M LTCS   Y  1 TRM     F SVD   Y     The following portions of the patient's history were reviewed and updated as appropriate: allergies, current medications, past family history, past medical history, past social history, past surgical history and problem list.  Review of Systems Pertinent items are noted in HPI.    Objective:   BP 142/83  Pulse 62  Temp(Src) 98.1 F (36.7 C) (Oral)  Ht 5\' 5"  (1.651 m)  Wt 292 lb 8 oz (132.677 kg)  BMI 48.67 kg/m2 GENERAL: Well-developed, obese female in no acute distress.  HEENT: Normocephalic, atraumatic. Sclerae anicteric.  NECK: Supple. Normal thyroid.  LUNGS: Clear to auscultation bilaterally.  HEART: Regular rate and rhythm.  BREASTS: Large, symmetric in size. No masses, skin changes, nipple drainage, or lymphadenopathy.  ABDOMEN: Soft, obese, nontender, nondistended. No organomegaly palpated. PELVIC: Normal external female genitalia. Vagina is pink and rugated. Normal discharge. Normal cervix contour. Pap smear obtained. Unable to palpate uterus or adnexa secondary to habitus  EXTREMITIES: No cyanosis, clubbing, or edema, 2+ distal pulses.  Assessment:   Annual gynecologic examination Fibroids Perimenopausal bleeding   Plan:   Pap, ancillary testing and STI testing done, will follow up results and manage accordingly. Mammogram scheduled No intervention needed for her bleeding or fibroids now; will continue to monitor CMET checked today as her Cr was 1.16 on 02/10/14. If normal, patient may take NSAIDS as needed for cramping. Routine preventative health maintenance measures emphasized    Verita Schneiders, MD, [place] Attending [place],  [place]

## 2014-04-14 NOTE — Patient Instructions (Addendum)
Thank you for enrolling in [facility]. Please follow the instructions below to securely access your online medical record. MyChart allows you to send messages to your doctor, view your test results, manage appointments, and more.   How Do I Sign Up? 1. In your Internet browser, go to [employer] and enter [email or web address]. 2. Click on the Sign Up Now link in the Sign In box. You will see the New Member Sign Up page. 3. Enter your MyChart Access Code exactly as it appears below. You will not need to use this code after you've completed the sign-up process. If you do not sign up before the expiration date, you must request a new code.  MyChart Access Code: [email or web address] Expires: 06/13/2014  1:44 PM  4. Enter your Social Security Number ([id number]) and Date of Birth (mm/dd/yyyy) as indicated and click Submit. You will be taken to the next sign-up page. 5. Create a MyChart ID. This will be your MyChart login ID and cannot be changed, so think of one that is secure and easy to remember. 6. Create a MyChart password. You can change your password at any time. 7. Enter your Password Reset Question and Answer. This can be used at a later time if you forget your password.  8. Enter your e-mail address. You will receive e-mail notification when new information is available in [place]. 9. Click Sign Up. You can now view your medical record.   Additional Information Remember, MyChart is NOT to be used for urgent needs. For medical emergencies, dial 911.    Return to clinic for any scheduled appointments or for any gynecologic concerns as needed.     Preventive Care for Adults, Female A healthy lifestyle and preventive care can promote health and wellness. Preventive health guidelines for women include the following key practices.  A routine yearly physical is a good way to check with your health care provider about your health and preventive screening. It is a chance to share any  concerns and updates on your health and to receive a thorough exam.  Visit your dentist for a routine exam and preventive care every 6 months. Brush your teeth twice a day and floss once a day. Good oral hygiene prevents tooth decay and gum disease.  The frequency of eye exams is based on your age, health, family medical history, use of contact lenses, and other factors. Follow your health care provider's recommendations for frequency of eye exams.  Eat a healthy diet. Foods like vegetables, fruits, whole grains, low-fat dairy products, and lean protein foods contain the nutrients you need without too many calories. Decrease your intake of foods high in solid fats, added sugars, and salt. Eat the right amount of calories for you.Get information about a proper diet from your health care provider, if necessary.  Regular physical exercise is one of the most important things you can do for your health. Most adults should get at least 150 minutes of moderate-intensity exercise (any activity that increases your heart rate and causes you to sweat) each week. In addition, most adults need muscle-strengthening exercises on 2 or more days a week.  Maintain a healthy weight. The body mass index (BMI) is a screening tool to identify possible weight problems. It provides an estimate of body fat based on height and weight. Your health care provider can find your BMI, and can help you achieve or maintain a healthy weight.For adults 20 years and older:  A BMI below 18.5 is considered underweight.  A  BMI of 18.5 to 24.9 is normal.  A BMI of 25 to 29.9 is considered overweight.  A BMI of 30 and above is considered obese.  Maintain normal blood lipids and cholesterol levels by exercising and minimizing your intake of saturated fat. Eat a balanced diet with plenty of fruit and vegetables. Blood tests for lipids and cholesterol should begin at age 33 and be repeated every 5 years. If your lipid or cholesterol  levels are high, you are over 50, or you are at high risk for heart disease, you may need your cholesterol levels checked more frequently.Ongoing high lipid and cholesterol levels should be treated with medicines if diet and exercise are not working.  If you smoke, find out from your health care provider how to quit. If you do not use tobacco, do not start.  Lung cancer screening is recommended for adults aged 40 80 years who are at high risk for developing lung cancer because of a history of smoking. A yearly low-dose CT scan of the lungs is recommended for people who have at least a 30-pack-year history of smoking and are a current smoker or have quit within the past 15 years. A pack year of smoking is smoking an average of 1 pack of cigarettes a day for 1 year (for example: 1 pack a day for 30 years or 2 packs a day for 15 years). Yearly screening should continue until the smoker has stopped smoking for at least 15 years. Yearly screening should be stopped for people who develop a health problem that would prevent them from having lung cancer treatment.  If you are pregnant, do not drink alcohol. If you are breastfeeding, be very cautious about drinking alcohol. If you are not pregnant and choose to drink alcohol, do not have more than 1 drink per day. One drink is considered to be 12 ounces (355 mL) of beer, 5 ounces (148 mL) of wine, or 1.5 ounces (44 mL) of liquor.  Avoid use of street drugs. Do not share needles with anyone. Ask for help if you need support or instructions about stopping the use of drugs.  High blood pressure causes heart disease and increases the risk of stroke. Your blood pressure should be checked at least every 1 to 2 years. Ongoing high blood pressure should be treated with medicines if weight loss and exercise do not work.  If you are 17 54 years old, ask your health care provider if you should take aspirin to prevent strokes.  Diabetes screening involves taking a blood  sample to check your fasting blood sugar level. This should be done once every 3 years, after age 10, if you are within normal weight and without risk factors for diabetes. Testing should be considered at a younger age or be carried out more frequently if you are overweight and have at least 1 risk factor for diabetes.  Breast cancer screening is essential preventive care for women. You should practice "breast self-awareness." This means understanding the normal appearance and feel of your breasts and may include breast self-examination. Any changes detected, no matter how small, should be reported to a health care provider. Women in their 7s and 30s should have a clinical breast exam (CBE) by a health care provider as part of a regular health exam every 1 to 3 years. After age 38, women should have a CBE every year. Starting at age 71, women should consider having a mammogram (breast X-ray test) every year. Women who have a  family history of breast cancer should talk to their health care provider about genetic screening. Women at a high risk of breast cancer should talk to their health care providers about having an MRI and a mammogram every year.  Breast cancer gene (BRCA)-related cancer risk assessment is recommended for women who have family members with BRCA-related cancers. BRCA-related cancers include breast, ovarian, tubal, and peritoneal cancers. Having family members with these cancers may be associated with an increased risk for harmful changes (mutations) in the breast cancer genes BRCA1 and BRCA2. Results of the assessment will determine the need for genetic counseling and BRCA1 and BRCA2 testing.  The Pap test is a screening test for cervical cancer. A Pap test can show cell changes on the cervix that might become cervical cancer if left untreated. A Pap test is a procedure in which cells are obtained and examined from the lower end of the uterus (cervix).  Women should have a Pap test  starting at age 58.  Between ages 14 and 80, Pap tests should be repeated every 2 years.  Beginning at age 64, you should have a Pap test every 3 years as long as the past 3 Pap tests have been normal.  Some women have medical problems that increase the chance of getting cervical cancer. Talk to your health care provider about these problems. It is especially important to talk to your health care provider if a new problem develops soon after your last Pap test. In these cases, your health care provider may recommend more frequent screening and Pap tests.  The above recommendations are the same for women who have or have not gotten the vaccine for human papillomavirus (HPV).  If you had a hysterectomy for a problem that was not cancer or a condition that could lead to cancer, then you no longer need Pap tests. Even if you no longer need a Pap test, a regular exam is a good idea to make sure no other problems are starting.  If you are between ages 8 and 20 years, and you have had normal Pap tests going back 10 years, you no longer need Pap tests. Even if you no longer need a Pap test, a regular exam is a good idea to make sure no other problems are starting.  If you have had past treatment for cervical cancer or a condition that could lead to cancer, you need Pap tests and screening for cancer for at least 20 years after your treatment.  If Pap tests have been discontinued, risk factors (such as a new sexual partner) need to be reassessed to determine if screening should be resumed.  The HPV test is an additional test that may be used for cervical cancer screening. The HPV test looks for the virus that can cause the cell changes on the cervix. The cells collected during the Pap test can be tested for HPV. The HPV test could be used to screen women aged 32 years and older, and should be used in women of any age who have unclear Pap test results. After the age of 9, women should have HPV testing at  the same frequency as a Pap test.  Colorectal cancer can be detected and often prevented. Most routine colorectal cancer screening begins at the age of 1 years and continues through age 24 years. However, your health care provider may recommend screening at an earlier age if you have risk factors for colon cancer. On a yearly basis, your health care provider  may provide home test kits to check for hidden blood in the stool. Use of a small camera at the end of a tube, to directly examine the colon (sigmoidoscopy or colonoscopy), can detect the earliest forms of colorectal cancer. Talk to your health care provider about this at age 69, when routine screening begins. Direct exam of the colon should be repeated every 5 10 years through age 34 years, unless early forms of pre-cancerous polyps or small growths are found.  People who are at an increased risk for hepatitis B should be screened for this virus. You are considered at high risk for hepatitis B if:  You were born in a country where hepatitis B occurs often. Talk with your health care provider about which countries are considered high risk.  Your parents were born in a high-risk country and you have not received a shot to protect against hepatitis B (hepatitis B vaccine).  You have HIV or AIDS.  You use needles to inject street drugs.  You live with, or have sex with, someone who has Hepatitis B.  You get hemodialysis treatment.  You take certain medicines for conditions like cancer, organ transplantation, and autoimmune conditions.  Hepatitis C blood testing is recommended for all people born from [birth year] through 1965 and any individual with known risks for hepatitis C.  Practice safe sex. Use condoms and avoid high-risk sexual practices to reduce the spread of sexually transmitted infections (STIs). STIs include gonorrhea, chlamydia, syphilis, trichomonas, herpes, HPV, and human immunodeficiency virus (HIV). Herpes, HIV, and HPV are viral  illnesses that have no cure. They can result in disability, cancer, and death. Sexually active women aged 42 years and younger should be checked for chlamydia. Older women with new or multiple partners should also be tested for chlamydia. Testing for other STIs is recommended if you are sexually active and at increased risk.  Osteoporosis is a disease in which the bones lose minerals and strength with aging. This can result in serious bone fractures or breaks. The risk of osteoporosis can be identified using a bone density scan. Women ages 29 years and over and women at risk for fractures or osteoporosis should discuss screening with their health care providers. Ask your health care provider whether you should take a calcium supplement or vitamin D to reduce the rate of osteoporosis.  Menopause can be associated with physical symptoms and risks. Hormone replacement therapy is available to decrease symptoms and risks. You should talk to your health care provider about whether hormone replacement therapy is right for you.  Use sunscreen. Apply sunscreen liberally and repeatedly throughout the day. You should seek shade when your shadow is shorter than you. Protect yourself by wearing long sleeves, pants, a wide-brimmed hat, and sunglasses year round, whenever you are outdoors.  Once a month, do a whole body skin exam, using a mirror to look at the skin on your back. Tell your health care provider of new moles, moles that have irregular borders, moles that are larger than a pencil eraser, or moles that have changed in shape or color.  Stay current with required vaccines (immunizations).  Influenza vaccine. All adults should be immunized every year.  Tetanus, diphtheria, and acellular pertussis (Td, Tdap) vaccine. Pregnant women should receive 1 dose of Tdap vaccine during each pregnancy. The dose should be obtained regardless of the length of time since the last dose. Immunization is preferred during the  27th 36th week of gestation. An adult who has not previously received  Tdap or who does not know her vaccine status should receive 1 dose of Tdap. This initial dose should be followed by tetanus and diphtheria toxoids (Td) booster doses every 10 years. Adults with an unknown or incomplete history of completing a 3-dose immunization series with Td-containing vaccines should begin or complete a primary immunization series including a Tdap dose. Adults should receive a Td booster every 10 years.  Varicella vaccine. An adult without evidence of immunity to varicella should receive 2 doses or a second dose if she has previously received 1 dose. Pregnant females who do not have evidence of immunity should receive the first dose after pregnancy. This first dose should be obtained before leaving the health care facility. The second dose should be obtained 4 8 weeks after the first dose.  Human papillomavirus (HPV) vaccine. Females aged 64 26 years who have not received the vaccine previously should obtain the 3-dose series. The vaccine is not recommended for use in pregnant females. However, pregnancy testing is not needed before receiving a dose. If a female is found to be pregnant after receiving a dose, no treatment is needed. In that case, the remaining doses should be delayed until after the pregnancy. Immunization is recommended for any person with an immunocompromised condition through the age of 36 years if she did not get any or all doses earlier. During the 3-dose series, the second dose should be obtained 4 8 weeks after the first dose. The third dose should be obtained 24 weeks after the first dose and 16 weeks after the second dose.  Zoster vaccine. One dose is recommended for adults aged 85 years or older unless certain conditions are present.  Measles, mumps, and rubella (MMR) vaccine. Adults born before [birth year] generally are considered immune to measles and mumps. Adults born in [birth year] or later should have  1 or more doses of MMR vaccine unless there is a contraindication to the vaccine or there is laboratory evidence of immunity to each of the three diseases. A routine second dose of MMR vaccine should be obtained at least 28 days after the first dose for students attending postsecondary schools, health care workers, or international travelers. People who received inactivated measles vaccine or an unknown type of measles vaccine during 1963 1967 should receive 2 doses of MMR vaccine. People who received inactivated mumps vaccine or an unknown type of mumps vaccine before 1979 and are at high risk for mumps infection should consider immunization with 2 doses of MMR vaccine. For females of childbearing age, rubella immunity should be determined. If there is no evidence of immunity, females who are not pregnant should be vaccinated. If there is no evidence of immunity, females who are pregnant should delay immunization until after pregnancy. Unvaccinated health care workers born before [birth year] who lack laboratory evidence of measles, mumps, or rubella immunity or laboratory confirmation of disease should consider measles and mumps immunization with 2 doses of MMR vaccine or rubella immunization with 1 dose of MMR vaccine.  Pneumococcal 13-valent conjugate (PCV13) vaccine. When indicated, a person who is uncertain of her immunization history and has no record of immunization should receive the PCV13 vaccine. An adult aged 61 years or older who has certain medical conditions and has not been previously immunized should receive 1 dose of PCV13 vaccine. This PCV13 should be followed with a dose of pneumococcal polysaccharide (PPSV23) vaccine. The PPSV23 vaccine dose should be obtained at least 8 weeks after the dose of PCV13 vaccine. An adult aged 98  years or older who has certain medical conditions and previously received 1 or more doses of PPSV23 vaccine should receive 1 dose of PCV13. The PCV13 vaccine dose should be  obtained 1 or more years after the last PPSV23 vaccine dose.  Pneumococcal polysaccharide (PPSV23) vaccine. When PCV13 is also indicated, PCV13 should be obtained first. All adults aged 10 years and older should be immunized. An adult younger than age 80 years who has certain medical conditions should be immunized. Any person who resides in a nursing home or long-term care facility should be immunized. An adult smoker should be immunized. People with an immunocompromised condition and certain other conditions should receive both PCV13 and PPSV23 vaccines. People with human immunodeficiency virus (HIV) infection should be immunized as soon as possible after diagnosis. Immunization during chemotherapy or radiation therapy should be avoided. Routine use of PPSV23 vaccine is not recommended for American Indians, [place] Natives, or people younger than 65 years unless there are medical conditions that require PPSV23 vaccine. When indicated, people who have unknown immunization and have no record of immunization should receive PPSV23 vaccine. One-time revaccination 5 years after the first dose of PPSV23 is recommended for people aged 79 64 years who have chronic kidney failure, nephrotic syndrome, asplenia, or immunocompromised conditions. People who received 1 2 doses of PPSV23 before age 56 years should receive another dose of PPSV23 vaccine at age 53 years or later if at least 5 years have passed since the previous dose. Doses of PPSV23 are not needed for people immunized with PPSV23 at or after age 31 years.  Meningococcal vaccine. Adults with asplenia or persistent complement component deficiencies should receive 2 doses of quadrivalent meningococcal conjugate (MenACWY-D) vaccine. The doses should be obtained at least 2 months apart. Microbiologists working with certain meningococcal bacteria, [place] recruits, people at risk during an outbreak, and people who travel to or live in countries with a high rate of  meningitis should be immunized. A first-year college student up through age 64 years who is living in a residence hall should receive a dose if she did not receive a dose on or after her 16th birthday. Adults who have certain high-risk conditions should receive one or more doses of vaccine.  Hepatitis A vaccine. Adults who wish to be protected from this disease, have certain high-risk conditions, work with hepatitis A-infected animals, work in hepatitis A research labs, or travel to or work in countries with a high rate of hepatitis A should be immunized. Adults who were previously unvaccinated and who anticipate close contact with an international adoptee during the first 60 days after arrival in the [place] from a country with a high rate of hepatitis A should be immunized.  Hepatitis B vaccine. Adults who wish to be protected from this disease, have certain high-risk conditions, may be exposed to blood or other infectious body fluids, are household contacts or sex partners of hepatitis B positive people, are clients or workers in certain care facilities, or travel to or work in countries with a high rate of hepatitis B should be immunized.  Haemophilus influenzae type b (Hib) vaccine. A previously unvaccinated person with asplenia or sickle cell disease or having a scheduled splenectomy should receive 1 dose of Hib vaccine. Regardless of previous immunization, a recipient of a hematopoietic stem cell transplant should receive a 3-dose series 6 12 months after her successful transplant. Hib vaccine is not recommended for adults with HIV infection. Preventive Services / Frequency Ages 11 to 5years  Blood pressure check.** / Every 1 to 2 years.  Lipid and cholesterol check.** / Every 5 years beginning at age 59.  Clinical breast exam.** / Every 3 years for women in their 7s and 83s.  BRCA-related cancer risk assessment.** / For women who have family members with a BRCA-related cancer (breast,  ovarian, tubal, or peritoneal cancers).  Pap test.** / Every 2 years from ages 23 through 71. Every 3 years starting at age 60 through age 43 or 85 with a history of 3 consecutive normal Pap tests.  HPV screening.** / Every 3 years from ages 43 through ages 49 to 20 with a history of 3 consecutive normal Pap tests.  Hepatitis C blood test.** / For any individual with known risks for hepatitis C.  Skin self-exam. / Monthly.  Influenza vaccine. / Every year.  Tetanus, diphtheria, and acellular pertussis (Tdap, Td) vaccine.** / Consult your health care provider. Pregnant women should receive 1 dose of Tdap vaccine during each pregnancy. 1 dose of Td every 10 years.  Varicella vaccine.** / Consult your health care provider. Pregnant females who do not have evidence of immunity should receive the first dose after pregnancy.  HPV vaccine. / 3 doses over 6 months, if 14 and younger. The vaccine is not recommended for use in pregnant females. However, pregnancy testing is not needed before receiving a dose.  Measles, mumps, rubella (MMR) vaccine.** / You need at least 1 dose of MMR if you were born in 1957 or later. You may also need a 2nd dose. For females of childbearing age, rubella immunity should be determined. If there is no evidence of immunity, females who are not pregnant should be vaccinated. If there is no evidence of immunity, females who are pregnant should delay immunization until after pregnancy.  Pneumococcal 13-valent conjugate (PCV13) vaccine.** / Consult your health care provider.  Pneumococcal polysaccharide (PPSV23) vaccine.** / 1 to 2 doses if you smoke cigarettes or if you have certain conditions.  Meningococcal vaccine.** / 1 dose if you are age 55 to 75 years and a Market researcher living in a residence hall, or have one of several medical conditions, you need to get vaccinated against meningococcal disease. You may also need additional booster doses.  Hepatitis A  vaccine.** / Consult your health care provider.  Hepatitis B vaccine.** / Consult your health care provider.  Haemophilus influenzae type b (Hib) vaccine.** / Consult your health care provider. Ages 23 to 64years  Blood pressure check.** / Every 1 to 2 years.  Lipid and cholesterol check.** / Every 5 years beginning at age 11 years.  Lung cancer screening. / Every year if you are aged 53 80 years and have a 30-pack-year history of smoking and currently smoke or have quit within the past 15 years. Yearly screening is stopped once you have quit smoking for at least 15 years or develop a health problem that would prevent you from having lung cancer treatment.  Clinical breast exam.** / Every year after age 21 years.  BRCA-related cancer risk assessment.** / For women who have family members with a BRCA-related cancer (breast, ovarian, tubal, or peritoneal cancers).  Mammogram.** / Every year beginning at age 16 years and continuing for as long as you are in good health. Consult with your health care provider.  Pap test.** / Every 3 years starting at age 49 years through age 28 or 30 years with a history of 3 consecutive normal Pap tests.  HPV screening.** / Every 3  years from ages 16 years through ages 31 to 72 years with a history of 3 consecutive normal Pap tests.  Fecal occult blood test (FOBT) of stool. / Every year beginning at age 49 years and continuing until age 43 years. You may not need to do this test if you get a colonoscopy every 10 years.  Flexible sigmoidoscopy or colonoscopy.** / Every 5 years for a flexible sigmoidoscopy or every 10 years for a colonoscopy beginning at age 18 years and continuing until age 76 years.  Hepatitis C blood test.** / For all people born from [birth year] through 1966 and any individual with known risks for hepatitis C.  Skin self-exam. / Monthly.  Influenza vaccine. / Every year.  Tetanus, diphtheria, and acellular pertussis (Tdap/Td) vaccine.** /  Consult your health care provider. Pregnant women should receive 1 dose of Tdap vaccine during each pregnancy. 1 dose of Td every 10 years.  Varicella vaccine.** / Consult your health care provider. Pregnant females who do not have evidence of immunity should receive the first dose after pregnancy.  Zoster vaccine.** / 1 dose for adults aged 17 years or older.  Measles, mumps, rubella (MMR) vaccine.** / You need at least 1 dose of MMR if you were born in 1957 or later. You may also need a 2nd dose. For females of childbearing age, rubella immunity should be determined. If there is no evidence of immunity, females who are not pregnant should be vaccinated. If there is no evidence of immunity, females who are pregnant should delay immunization until after pregnancy.  Pneumococcal 13-valent conjugate (PCV13) vaccine.** / Consult your health care provider.  Pneumococcal polysaccharide (PPSV23) vaccine.** / 1 to 2 doses if you smoke cigarettes or if you have certain conditions.  Meningococcal vaccine.** / Consult your health care provider.  Hepatitis A vaccine.** / Consult your health care provider.  Hepatitis B vaccine.** / Consult your health care provider.  Haemophilus influenzae type b (Hib) vaccine.** / Consult your health care provider. Ages 15 years and over  Blood pressure check.** / Every 1 to 2 years.  Lipid and cholesterol check.** / Every 5 years beginning at age 61 years.  Lung cancer screening. / Every year if you are aged 95 80 years and have a 30-pack-year history of smoking and currently smoke or have quit within the past 15 years. Yearly screening is stopped once you have quit smoking for at least 15 years or develop a health problem that would prevent you from having lung cancer treatment.  Clinical breast exam.** / Every year after age 84 years.  BRCA-related cancer risk assessment.** / For women who have family members with a BRCA-related cancer (breast, ovarian, tubal,  or peritoneal cancers).  Mammogram.** / Every year beginning at age 28 years and continuing for as long as you are in good health. Consult with your health care provider.  Pap test.** / Every 3 years starting at age 19 years through age 25 or 75 years with 3 consecutive normal Pap tests. Testing can be stopped between 65 and 70 years with 3 consecutive normal Pap tests and no abnormal Pap or HPV tests in the past 10 years.  HPV screening.** / Every 3 years from ages 57 years through ages 76 or 21 years with a history of 3 consecutive normal Pap tests. Testing can be stopped between 65 and 70 years with 3 consecutive normal Pap tests and no abnormal Pap or HPV tests in the past 10 years.  Fecal occult blood test (  FOBT) of stool. / Every year beginning at age 59 years and continuing until age 62 years. You may not need to do this test if you get a colonoscopy every 10 years.  Flexible sigmoidoscopy or colonoscopy.** / Every 5 years for a flexible sigmoidoscopy or every 10 years for a colonoscopy beginning at age 60 years and continuing until age 9 years.  Hepatitis C blood test.** / For all people born from [birth year] through 1965 and any individual with known risks for hepatitis C.  Osteoporosis screening.** / A one-time screening for women ages 55 years and over and women at risk for fractures or osteoporosis.  Skin self-exam. / Monthly.  Influenza vaccine. / Every year.  Tetanus, diphtheria, and acellular pertussis (Tdap/Td) vaccine.** / 1 dose of Td every 10 years.  Varicella vaccine.** / Consult your health care provider.  Zoster vaccine.** / 1 dose for adults aged 78 years or older.  Pneumococcal 13-valent conjugate (PCV13) vaccine.** / Consult your health care provider.  Pneumococcal polysaccharide (PPSV23) vaccine.** / 1 dose for all adults aged 65 years and older.  Meningococcal vaccine.** / Consult your health care provider.  Hepatitis A vaccine.** / Consult your health care  provider.  Hepatitis B vaccine.** / Consult your health care provider.  Haemophilus influenzae type b (Hib) vaccine.** / Consult your health care provider. ** Family history and personal history of risk and conditions may change your health care provider's recommendations. Document Released: 12/18/2001 Document Revised: 08/12/2013 Document Reviewed: 03/19/2011 [facility] Patient Information 2014 [place], [place].

## 2014-04-15 LAB — COMPREHENSIVE METABOLIC PANEL
ALBUMIN: 4.3 g/dL (ref 3.5–5.2)
ALT: 13 U/L (ref 0–35)
AST: 12 U/L (ref 0–37)
Alkaline Phosphatase: 59 U/L (ref 39–117)
BUN: 17 mg/dL (ref 6–23)
CALCIUM: 10.3 mg/dL (ref 8.4–10.5)
CO2: 26 mEq/L (ref 19–32)
Chloride: 102 mEq/L (ref 96–112)
Creat: 1.16 mg/dL — ABNORMAL HIGH (ref 0.50–1.10)
Glucose, Bld: 160 mg/dL — ABNORMAL HIGH (ref 70–99)
POTASSIUM: 3.8 meq/L (ref 3.5–5.3)
SODIUM: 139 meq/L (ref 135–145)
TOTAL PROTEIN: 7.5 g/dL (ref 6.0–8.3)
Total Bilirubin: 0.6 mg/dL (ref 0.2–1.2)

## 2014-04-15 LAB — HIV ANTIBODY (ROUTINE TESTING W REFLEX): HIV 1&2 Ab, 4th Generation: NONREACTIVE

## 2014-04-15 LAB — RPR

## 2014-04-15 LAB — CYTOLOGY - PAP

## 2014-04-15 LAB — HEPATITIS C ANTIBODY: HCV Ab: NEGATIVE

## 2014-04-16 ENCOUNTER — Telehealth: Payer: Self-pay

## 2014-04-16 MED ORDER — IBUPROFEN 800 MG PO TABS: 800.0000 mg | ORAL_TABLET | Freq: Three times a day (TID) | ORAL | Status: DC | PRN

## 2014-04-16 NOTE — Telephone Encounter (Signed)
Called patient and informed her of results. Patient verbalized understanding. Patient requested ibuprofen 800mg  prescription. Message sent to Dr. Harolyn Rutherford.

## 2014-04-16 NOTE — Telephone Encounter (Signed)
Ibuprofen prescribed. Patient called and advised to pick up prescription.

## 2014-04-16 NOTE — Telephone Encounter (Signed)
Message copied by Geanie Logan on Fri Apr 16, 2014 11:38 AM ------      Message from: Verita Schneiders A      Created: Fri Apr 16, 2014 10:11 AM       Normal pap, negative HRHPV, Trich, GC and Chlam. Negative HIV, Hep  B and RPR. Continue cervical cancer screening as recommended. Please call to inform patient of results.      Cr is stable, can take NSAIDs as instructed for pain (ibuprofen, naproxen etc).       ------

## 2014-05-11 ENCOUNTER — Encounter: Payer: Self-pay | Admitting: Family Medicine

## 2014-05-11 ENCOUNTER — Ambulatory Visit (INDEPENDENT_AMBULATORY_CARE_PROVIDER_SITE_OTHER): Payer: Medicaid Other | Admitting: Family Medicine

## 2014-05-11 VITALS — BP 159/90 | HR 71 | Temp 98.8°F | Ht 65.5 in | Wt 299.0 lb

## 2014-05-11 DIAGNOSIS — E119 Type 2 diabetes mellitus without complications: Secondary | ICD-10-CM

## 2014-05-11 DIAGNOSIS — I10 Essential (primary) hypertension: Secondary | ICD-10-CM

## 2014-05-11 DIAGNOSIS — R252 Cramp and spasm: Secondary | ICD-10-CM | POA: Insufficient documentation

## 2014-05-11 LAB — CK: CK TOTAL: 165 U/L (ref 7–177)

## 2014-05-11 LAB — POCT GLYCOSYLATED HEMOGLOBIN (HGB A1C): Hemoglobin A1C: 8.1

## 2014-05-11 LAB — MAGNESIUM: MAGNESIUM: 1.4 mg/dL — AB (ref 1.5–2.5)

## 2014-05-11 MED ORDER — ATORVASTATIN CALCIUM 40 MG PO TABS: 40.0000 mg | ORAL_TABLET | Freq: Every day | ORAL | Status: DC

## 2014-05-11 MED ORDER — METFORMIN HCL 1000 MG PO TABS: 1000.0000 mg | ORAL_TABLET | Freq: Two times a day (BID) | ORAL | Status: DC

## 2014-05-11 MED ORDER — LOSARTAN POTASSIUM 100 MG PO TABS: 100.0000 mg | ORAL_TABLET | Freq: Every day | ORAL | Status: DC

## 2014-05-11 MED ORDER — GLIPIZIDE 10 MG PO TABS: 10.0000 mg | ORAL_TABLET | Freq: Every day | ORAL | Status: DC

## 2014-05-11 MED ORDER — HYDROCHLOROTHIAZIDE 25 MG PO TABS: 25.0000 mg | ORAL_TABLET | Freq: Every day | ORAL | Status: DC

## 2014-05-11 MED ORDER — CLONIDINE HCL 0.3 MG PO TABS: 0.3000 mg | ORAL_TABLET | Freq: Two times a day (BID) | ORAL | Status: DC

## 2014-05-11 MED ORDER — AMLODIPINE BESYLATE 10 MG PO TABS: 10.0000 mg | ORAL_TABLET | Freq: Every day | ORAL | Status: DC

## 2014-05-11 MED ORDER — METOPROLOL TARTRATE 100 MG PO TABS: 50.0000 mg | ORAL_TABLET | Freq: Two times a day (BID) | ORAL | Status: DC

## 2014-05-11 NOTE — Patient Instructions (Signed)
It was nice to meet you today.  I refilled all of your blood pressure and diabetes medications.  We are checking your A1C to monitor your diabetes today.  For your leg cramps, I am checking some labs to figure out the cause.  Be sure to drink lots of water, especially after exercising.  Let us know if you develop leg cramping while active.  Best, Dr. B  Leg Cramps Leg cramps that occur during exercise can be caused by poor circulation or dehydration. However, muscle cramps that occur at rest or during the night are usually not due to any serious medical problem. Heat cramps may cause muscle spasms during hot weather.  CAUSES There is no clear cause for muscle cramps. However, dehydration may be a factor for those who do not drink enough fluids and those who exercise in the heat. Imbalances in the level of sodium, potassium, calcium or magnesium in the muscle tissue may also be a factor. Some medications, such as water pills (diuretics), may cause loss of chemicals that the body needs (like sodium and potassium) and cause muscle cramps. TREATMENT   Make sure your diet has enough fluids and essential minerals for the muscle to work normally.  Avoid strenuous exercise for several days if you have been having frequent leg cramps.  Stretch and massage the cramped muscle for several minutes.  Some medicines may be helpful in some patients with night cramps. Only take over-the-counter or prescription medicines as directed by your caregiver. SEEK IMMEDIATE MEDICAL CARE IF:   Your leg cramps become worse.  Your foot becomes cold, numb, or blue. Document Released: 11/29/2004 Document Revised: 01/14/2012 Document Reviewed: 11/16/2008 [facility] Patient Information 2015 [place], [place]. This information is not intended to replace advice given to you by your health care provider. Make sure you discuss any questions you have with your health care provider.

## 2014-05-11 NOTE — Progress Notes (Signed)
Subjective:   CC: establish care with new PCP  HPI:   1. HTN:  Patient only took medications 1 hour before visit today.  She reports home BPs of 130/75-150/97.  She will bring home BP log with her to next appt.  CMET in 04/2014 normal, except Cr 1.16.  She is followed by Dr. Kathlen Mody (Cardiology).  She denies CP or SOB, except with Panic attacks (never exertional).  2. T2DM: A1C 6.8 (4/15).  Home CBGs 140s-180s. She will bring home BP log with her to next appt. She denies any hypoglycemia.  She is trying to eat well and exercise more.  3. Leg cramps: Patient reports intermittent b/l leg cramping x2 months.  Cramping occurs ~30 min after activity when she is sitting down, never during activity. K and Na normal in 6/15.  Review of Systems - Per HPI. Additionally, feet itch intermittently.  Reviewed PMH, FH, and SH Smoking status: never smoker    Objective:  Physical Exam BP 159/90  Pulse 71  Temp(Src) 98.8 F (37.1 C) (Oral)  Ht 5' 5.5" (1.664 m)  Wt 299 lb (135.626 kg)  BMI 48.98 kg/m2 GEN: Pleasant AAF, sitting in NAD Neck: No LAD or JVD CV: RRR, no m/r/g. DP and Posterior tib pulses 2+ bilaterally. Pulm: CTAB, no w/r/c Ext: Trace edema bilaterally Neuro: CN 2-12 grossly intact.       Lavon Paganini, MD [place]

## 2014-05-11 NOTE — Assessment & Plan Note (Signed)
Uncontrolled today with BP 159/90, but pt only took medication 1h before visit.  Encouraged patient to take medication before visits and bring log of home BPs to next appt.  Will review at that time.  Refiled medications.  Patient to make f/u appt with Cardiology.

## 2014-05-11 NOTE — Assessment & Plan Note (Addendum)
Well controlled.  Recheck Hgb A1c today.  Refilled medications, continue current regimen.  Will review home log of CBGs at next appt.  Encouraged patient to continue to improve diet and exercise more.  Future fasting lipid panel ordered.

## 2014-05-11 NOTE — Assessment & Plan Note (Addendum)
History is not consistent with claudication, b/l distal pulses 2+.  Can consider ABIs in future.    Encouraged patient to drink a lot of fluids, especially when active as dehydration could be contributing.     K and Na normal in 6/15 (while pt already having symptoms).  Check Magnesium today.   Must consider statin-induced myopathy, as well, as patient takes Lipitor 40mg  qday.  Check CK and myoglobin today.  Will continue to follow.

## 2014-05-13 ENCOUNTER — Telehealth: Payer: Self-pay | Admitting: Family Medicine

## 2014-05-13 ENCOUNTER — Encounter: Payer: Self-pay | Admitting: Family Medicine

## 2014-05-13 DIAGNOSIS — R252 Cramp and spasm: Secondary | ICD-10-CM

## 2014-05-13 LAB — MYOGLOBIN, SERUM: Myoglobin: 43 mcg/L — ABNORMAL HIGH (ref <=30)

## 2014-05-13 MED ORDER — MAGNESIUM CHLORIDE 64 MG PO TBEC: 1.0000 | DELAYED_RELEASE_TABLET | Freq: Two times a day (BID) | ORAL | Status: DC

## 2014-05-14 NOTE — Telephone Encounter (Signed)
Called Ms. Bucklew this AM to discuss lab results.  Magnesium was 1.4, prescription for Mg Chloride sent to pharmacy and encouraged patient to eat green leafy vegetables.  Will recheck at f/u.  Myoglobin also slightly elevated.  Explained the importance of statin for diabetics.  Encouraged pt to stay well hydrated.  Will recheck in f/u.  HgbA1c increased from 6.8 to 8.1 in last 3 months.  Will not change medications now, encouraged diet and exercise.  Will discuss further in f/u.  RTC in 3 months.   Lavon Paganini , MD

## 2014-05-25 ENCOUNTER — Other Ambulatory Visit: Payer: Medicaid Other

## 2014-05-25 DIAGNOSIS — E785 Hyperlipidemia, unspecified: Secondary | ICD-10-CM

## 2014-05-25 DIAGNOSIS — I10 Essential (primary) hypertension: Secondary | ICD-10-CM

## 2014-05-25 NOTE — Progress Notes (Signed)
FLP DONE TODAY Andrea [name] 

## 2014-05-26 LAB — LIPID PANEL
CHOL/HDL RATIO: 4.2 ratio
Cholesterol: 230 mg/dL — ABNORMAL HIGH (ref 0–200)
HDL: 55 mg/dL (ref >39)
LDL Cholesterol: 143 mg/dL — ABNORMAL HIGH (ref 0–99)
TRIGLYCERIDES: 158 mg/dL — AB (ref <150)
VLDL: 32 mg/dL (ref 0–40)

## 2014-05-27 ENCOUNTER — Encounter: Payer: Self-pay | Admitting: Family Medicine

## 2014-05-27 MED ORDER — ATORVASTATIN CALCIUM 80 MG PO TABS: 80.0000 mg | ORAL_TABLET | Freq: Every day | ORAL | Status: DC

## 2014-05-27 NOTE — Progress Notes (Addendum)
Patient ID: Andrea [name], female   DOB: 25-Oct-1961, 53 y.o.   MRN: [id number]   I reviewed Andrea [name]'s lipid panel from 7/21.  Calculated 10 yr ASCVD risk of 11.9% with patient reported systolic BP of 191.     Lipid Panel     Component Value Date/Time   CHOL 230* 05/25/2014 1105   TRIG 158* 05/25/2014 1105   HDL 55 05/25/2014 1105   CHOLHDL 4.2 05/25/2014 1105   VLDL 32 05/25/2014 1105   LDLCALC 143* 05/25/2014 1105    A/P: Patient currently taking Lipitor 40mg  qday.  Will increase dose to 80mg  daily, as she warrants a high-intensity statin.  Prescription sent to pharmacy.   Lavon Paganini, MD [place]

## 2014-05-27 NOTE — Addendum Note (Signed)
Addended by: Virginia Crews on: 05/27/2014 03:57 PM   Modules accepted: Orders

## 2014-05-28 ENCOUNTER — Telehealth: Payer: Self-pay | Admitting: *Deleted

## 2014-05-28 NOTE — Telephone Encounter (Signed)
LMOVM for pt to call back Andrea [name] CMA

## 2014-05-28 NOTE — Telephone Encounter (Signed)
Message copied by Junious Dresser on Fri May 28, 2014  3:54 PM ------      Message from: Virginia Crews      Created: Thu May 27, 2014  3:58 PM      Regarding: Lab results.       Please call Andrea [name] and let her know about her lipid panel results.  I have sent a prescription for Lipitor 80mg  to her pharmacy.  She should stop taking her Lipitor 40mg  and start taking the 80mg  daily.            Thanks!            Lavon Paganini, MD                  ----- Message -----         From: Lab in [facility] Interface         Sent: 05/26/2014  12:30 AM           To: Lavon Paganini, MD                   ------

## 2014-06-14 ENCOUNTER — Ambulatory Visit (HOSPITAL_COMMUNITY)
Admission: RE | Admit: 2014-06-14 | Discharge: 2014-06-14 | Disposition: A | Discharge status: Home / Self Care | Payer: Medicaid Other | Source: Ambulatory Visit | Attending: Obstetrics & Gynecology | Admitting: Obstetrics & Gynecology

## 2014-06-14 DIAGNOSIS — Z1231 Encounter for screening mammogram for malignant neoplasm of breast: Secondary | ICD-10-CM | POA: Diagnosis present

## 2014-09-06 ENCOUNTER — Encounter: Payer: Self-pay | Admitting: Family Medicine

## 2014-09-20 ENCOUNTER — Other Ambulatory Visit: Payer: Self-pay | Admitting: *Deleted

## 2014-09-20 MED ORDER — SENNOSIDES-DOCUSATE SODIUM 8.6-50 MG PO TABS: 2.0000 | ORAL_TABLET | Freq: Every evening | ORAL | Status: DC | PRN

## 2014-09-21 ENCOUNTER — Other Ambulatory Visit: Payer: Self-pay | Admitting: *Deleted

## 2014-09-21 NOTE — Telephone Encounter (Signed)
Refilled this prescription yesterday.  It should be at the pharmacy.   Lavon Paganini, MD, MPH PGY-1,  [facility] 09/21/2014 12:36 PM

## 2014-10-14 ENCOUNTER — Other Ambulatory Visit: Payer: Self-pay | Admitting: Family Medicine

## 2014-10-16 NOTE — Telephone Encounter (Signed)
Refilled Glipizide.  Patient needs a f/u visit for DM.  Lavon Paganini, MD, MPH PGY-1,  [facility] 10/16/2014 8:55 PM

## 2014-11-17 ENCOUNTER — Other Ambulatory Visit: Payer: Self-pay | Admitting: Family Medicine

## 2014-11-20 ENCOUNTER — Emergency Department (HOSPITAL_COMMUNITY)
Admission: EM | Admit: 2014-11-20 | Discharge: 2014-11-20 | Disposition: A | Discharge status: Home / Self Care | Payer: Medicaid Other | Source: Home / Self Care | Attending: Emergency Medicine | Admitting: Emergency Medicine

## 2014-11-20 ENCOUNTER — Encounter (HOSPITAL_COMMUNITY): Payer: Self-pay | Admitting: *Deleted

## 2014-11-20 DIAGNOSIS — J069 Acute upper respiratory infection, unspecified: Secondary | ICD-10-CM

## 2014-11-20 MED ORDER — DOXYCYCLINE HYCLATE 100 MG PO CAPS: 100.0000 mg | ORAL_CAPSULE | Freq: Two times a day (BID) | ORAL | Status: DC

## 2014-11-20 NOTE — ED Provider Notes (Signed)
CSN: [id number]     Arrival date & time 11/20/14  1214 History   None    Chief Complaint  Patient presents with  . URI   (Consider location/radiation/quality/duration/timing/severity/associated sxs/prior Treatment) HPI            54 year old female presents complaining of being sick for a month. She has cough, congestion, rhinorrhea, sneezing, sinus pressure, ear pain. Symptoms are intermittently present for the past month. She has no chest pain or shortness of breath. She is taking over-the-counter medications with some relief in her symptoms. No recent travel or sick contacts.   Past Medical History  Diagnosis Date  . Hypertension   . Diabetes mellitus   . High cholesterol   . Hyperthyroidism     Radioactive iodine ablation  . Environmental allergies   . Hx of echocardiogram     Echo 2/14: mild LVH, EF 55-60%, Gr 1 diast dysfn, mild LAE  . Hx of cardiovascular stress test     a. ETT-MV 2/14:  EF 46% (normal by echo), low risk, no ischemia or scar   Past Surgical History  Procedure Laterality Date  . Cesarean section      x2   Family History  Problem Relation Age of Onset  . Pneumonia Mother   . Diabetes Mother   . Hypertension Father   . Hypertension Sister   . Hyperlipidemia Sister   . Hypertension Brother   . Hypertension Maternal Aunt   . Hypertension Paternal Aunt   . Cancer Paternal Aunt     throat cancer  . Diabetes Maternal Grandmother   . Diabetes Paternal Grandmother   . Cancer Paternal Grandmother     pancreatic cancer   History  Substance Use Topics  . Smoking status: Never Smoker   . Smokeless tobacco: Never Used  . Alcohol Use: No     Comment: occasional for special occasions   OB History    Gravida Para Term Preterm AB TAB SAB Ectopic Multiple Living   4 3 3  1  1   3      Review of Systems  Constitutional: Positive for chills. Negative for fever and unexpected weight change.  HENT: Positive for congestion, ear pain, postnasal drip,  rhinorrhea, sinus pressure, sneezing and sore throat.   Eyes: Negative for visual disturbance.  Respiratory: Positive for cough and shortness of breath.   Cardiovascular: Negative for chest pain.  Gastrointestinal: Negative for nausea, vomiting and diarrhea.  Musculoskeletal: Positive for myalgias.  Skin: Negative for rash.  All other systems reviewed and are negative.   Allergies  Review of patient's allergies indicates no known allergies.  Home Medications   Prior to Admission medications   Medication Sig Start Date End Date Taking? Authorizing Provider  amLODipine (NORVASC) 10 MG tablet Take 1 tablet (10 mg total) by mouth daily. 05/11/14   Lavon Paganini, MD  aspirin EC 81 MG tablet Take 81 mg by mouth daily.    Historical Provider, MD  atorvastatin (LIPITOR) 80 MG tablet Take 1 tablet (80 mg total) by mouth daily. 05/27/14   Lavon Paganini, MD  cloNIDine (CATAPRES) 0.3 MG tablet Take 1 tablet (0.3 mg total) by mouth 2 (two) times daily. 05/11/14   Lavon Paganini, MD  doxycycline (VIBRAMYCIN) 100 MG capsule Take 1 capsule (100 mg total) by mouth 2 (two) times daily. 11/20/14   Freeman Caldron Manoah Deckard, PA-C  fexofenadine (ALLEGRA) 180 MG tablet Take 1 tablet (180 mg total) by mouth daily as needed. For seasonal allergies 02/10/14  Melony Overly, MD  fluticasone (FLONASE) 50 MCG/ACT nasal spray Place 2 sprays into both nostrils daily. 02/10/14   Melony Overly, MD  glipiZIDE (GLUCOTROL) 10 MG tablet TAKE 1 TABLET (10 MG TOTAL) BY MOUTH DAILY BEFORE BREAKFAST. 10/16/14   Lavon Paganini, MD  hydrochlorothiazide (HYDRODIURIL) 25 MG tablet TAKE 1 TABLET (25 MG TOTAL) BY MOUTH DAILY. 11/17/14   Lavon Paganini, MD  ibuprofen (ADVIL,MOTRIN) 800 MG tablet Take 1 tablet (800 mg total) by mouth every 8 (eight) hours as needed. 04/16/14   Osborne Oman, MD  losartan (COZAAR) 100 MG tablet Take 1 tablet (100 mg total) by mouth daily. 05/11/14   Lavon Paganini, MD  magnesium chloride (SLOW-MAG) 64 MG  TBEC SR tablet Take 1 tablet (64 mg total) by mouth 2 (two) times daily. 05/13/14   Lavon Paganini, MD  metFORMIN (GLUCOPHAGE) 1000 MG tablet Take 1 tablet (1,000 mg total) by mouth 2 (two) times daily with a meal. 05/11/14   Lavon Paganini, MD  metoprolol (LOPRESSOR) 100 MG tablet Take 0.5 tablets (50 mg total) by mouth 2 (two) times daily. 05/11/14   Lavon Paganini, MD  montelukast (SINGULAIR) 10 MG tablet Take 1 tablet (10 mg total) by mouth at bedtime. 02/10/14   Melony Overly, MD  senna-docusate (SENOKOT-S) 8.6-50 MG per tablet Take 2 tablets by mouth at bedtime as needed for mild constipation. 09/20/14   Lavon Paganini, MD   BP 153/99 mmHg  Pulse 98  Temp(Src) 98.6 F (37 C) (Oral)  Resp 12  SpO2 96% Physical Exam  Constitutional: She is oriented to person, place, and time. Vital signs are normal. She appears well-developed and well-nourished. No distress.  HENT:  Head: Normocephalic and atraumatic.  Right Ear: External ear normal.  Left Ear: External ear normal.  Nose: Nose normal.  Mouth/Throat: Oropharynx is clear and moist. No oropharyngeal exudate.  Eyes: Conjunctivae are normal. Right eye exhibits no discharge. Left eye exhibits no discharge.  Neck: Normal range of motion. Neck supple.  Cardiovascular: Normal rate, regular rhythm and normal heart sounds.   Pulmonary/Chest: Effort normal and breath sounds normal. No respiratory distress.  Lymphadenopathy:    She has no cervical adenopathy.  Neurological: She is alert and oriented to person, place, and time. She has normal strength. Coordination normal.  Skin: Skin is warm and dry. No rash noted. She is not diaphoretic.  Psychiatric: She has a normal mood and affect. Judgment normal.  Nursing note and vitals reviewed.   ED Course  Procedures (including critical care time) Labs Review Labs Reviewed - No data to display  Imaging Review No results found.   MDM   1. URI (upper respiratory infection)    Into any  over-the-counter medications, add doxycycline for 1 week. Follow-up when necessary   Meds ordered this encounter  Medications  . doxycycline (VIBRAMYCIN) 100 MG capsule    Sig: Take 1 capsule (100 mg total) by mouth 2 (two) times daily.    Dispense:  14 capsule    Refill:  0    Order Specific Question:  Supervising Provider    Answer:  Jake Michaelis, DAVID C [[id number]]      Liam Graham, PA-C 11/20/14 1336

## 2014-11-20 NOTE — ED Notes (Signed)
Cold symptoms for 1 month.  Chest congestion, headaches, sore throat, sinus pain, chest pain and cough- non-prod.

## 2014-11-20 NOTE — Discharge Instructions (Signed)
Upper Respiratory Infection, Adult An upper respiratory infection (URI) is also sometimes known as the common cold. The upper respiratory tract includes the nose, sinuses, throat, trachea, and bronchi. Bronchi are the airways leading to the lungs. Most people improve within 1 week, but symptoms can last up to 2 weeks. A residual cough may last even longer.  CAUSES Many different viruses can infect the tissues lining the upper respiratory tract. The tissues become irritated and inflamed and often become very moist. Mucus production is also common. A cold is contagious. You can easily spread the virus to others by oral contact. This includes kissing, sharing a glass, coughing, or sneezing. Touching your mouth or nose and then touching a surface, which is then touched by another person, can also spread the virus. SYMPTOMS  Symptoms typically develop 1 to 3 days after you come in contact with a cold virus. Symptoms vary from person to person. They may include:  Runny nose.  Sneezing.  Nasal congestion.  Sinus irritation.  Sore throat.  Loss of voice (laryngitis).  Cough.  Fatigue.  Muscle aches.  Loss of appetite.  Headache.  Low-grade fever. DIAGNOSIS  You might diagnose your own cold based on familiar symptoms, since most people get a cold 2 to 3 times a year. Your caregiver can confirm this based on your exam. Most importantly, your caregiver can check that your symptoms are not due to another disease such as strep throat, sinusitis, pneumonia, asthma, or epiglottitis. Blood tests, throat tests, and X-rays are not necessary to diagnose a common cold, but they may sometimes be helpful in excluding other more serious diseases. Your caregiver will decide if any further tests are required. RISKS AND COMPLICATIONS  You may be at risk for a more severe case of the common cold if you smoke cigarettes, have chronic heart disease (such as heart failure) or lung disease (such as asthma), or if  you have a weakened immune system. The very young and very old are also at risk for more serious infections. Bacterial sinusitis, middle ear infections, and bacterial pneumonia can complicate the common cold. The common cold can worsen asthma and chronic obstructive pulmonary disease (COPD). Sometimes, these complications can require emergency medical care and may be life-threatening. PREVENTION  The best way to protect against getting a cold is to practice good hygiene. Avoid oral or hand contact with people with cold symptoms. Wash your hands often if contact occurs. There is no clear evidence that vitamin C, vitamin E, echinacea, or exercise reduces the chance of developing a cold. However, it is always recommended to get plenty of rest and practice good nutrition. TREATMENT  Treatment is directed at relieving symptoms. There is no cure. Antibiotics are not effective, because the infection is caused by a virus, not by bacteria. Treatment may include:  Increased fluid intake. Sports drinks offer valuable electrolytes, sugars, and fluids.  Breathing heated mist or steam (vaporizer or shower).  Eating chicken soup or other clear broths, and maintaining good nutrition.  Getting plenty of rest.  Using gargles or lozenges for comfort.  Controlling fevers with ibuprofen or acetaminophen as directed by your caregiver.  Increasing usage of your inhaler if you have asthma. Zinc gel and zinc lozenges, taken in the first 24 hours of the common cold, can shorten the duration and lessen the severity of symptoms. Pain medicines may help with fever, muscle aches, and throat pain. A variety of non-prescription medicines are available to treat congestion and runny nose. Your caregiver   can make recommendations and may suggest nasal or lung inhalers for other symptoms.  HOME CARE INSTRUCTIONS   Only take over-the-counter or prescription medicines for pain, discomfort, or fever as directed by your  caregiver.  Use a warm mist humidifier or inhale steam from a shower to increase air moisture. This may keep secretions moist and make it easier to breathe.  Drink enough water and fluids to keep your urine clear or pale yellow.  Rest as needed.  Return to work when your temperature has returned to normal or as your caregiver advises. You may need to stay home longer to avoid infecting others. You can also use a face mask and careful hand washing to prevent spread of the virus. SEEK MEDICAL CARE IF:   After the first few days, you feel you are getting worse rather than better.  You need your caregiver's advice about medicines to control symptoms.  You develop chills, worsening shortness of breath, or brown or red sputum. These may be signs of pneumonia.  You develop yellow or brown nasal discharge or pain in the face, especially when you bend forward. These may be signs of sinusitis.  You develop a fever, swollen neck glands, pain with swallowing, or white areas in the back of your throat. These may be signs of strep throat. SEEK IMMEDIATE MEDICAL CARE IF:   You have a fever.  You develop severe or persistent headache, ear pain, sinus pain, or chest pain.  You develop wheezing, a prolonged cough, cough up blood, or have a change in your usual mucus (if you have chronic lung disease).  You develop sore muscles or a stiff neck. Document Released: 04/17/2001 Document Revised: 01/14/2012 Document Reviewed: 01/27/2014 [employer] Patient Information 2015 [employer]. This information is not intended to replace advice given to you by your health care provider. Make sure you discuss any questions you have with your health care provider.  

## 2014-12-24 ENCOUNTER — Emergency Department (HOSPITAL_COMMUNITY)
Admission: EM | Admit: 2014-12-24 | Discharge: 2014-12-24 | Disposition: A | Discharge status: Home / Self Care | Payer: Medicaid Other | Attending: Emergency Medicine | Admitting: Emergency Medicine

## 2014-12-24 ENCOUNTER — Emergency Department (HOSPITAL_COMMUNITY): Payer: Medicaid Other

## 2014-12-24 ENCOUNTER — Encounter (HOSPITAL_COMMUNITY): Payer: Self-pay | Admitting: *Deleted

## 2014-12-24 DIAGNOSIS — S8991XA Unspecified injury of right lower leg, initial encounter: Secondary | ICD-10-CM | POA: Diagnosis not present

## 2014-12-24 DIAGNOSIS — M25561 Pain in right knee: Secondary | ICD-10-CM

## 2014-12-24 DIAGNOSIS — Z7951 Long term (current) use of inhaled steroids: Secondary | ICD-10-CM | POA: Insufficient documentation

## 2014-12-24 DIAGNOSIS — S8992XA Unspecified injury of left lower leg, initial encounter: Secondary | ICD-10-CM | POA: Insufficient documentation

## 2014-12-24 DIAGNOSIS — E119 Type 2 diabetes mellitus without complications: Secondary | ICD-10-CM | POA: Diagnosis not present

## 2014-12-24 DIAGNOSIS — E780 Pure hypercholesterolemia: Secondary | ICD-10-CM | POA: Insufficient documentation

## 2014-12-24 DIAGNOSIS — Z7982 Long term (current) use of aspirin: Secondary | ICD-10-CM | POA: Diagnosis not present

## 2014-12-24 DIAGNOSIS — Y9389 Activity, other specified: Secondary | ICD-10-CM | POA: Diagnosis not present

## 2014-12-24 DIAGNOSIS — Y998 Other external cause status: Secondary | ICD-10-CM | POA: Insufficient documentation

## 2014-12-24 DIAGNOSIS — W19XXXA Unspecified fall, initial encounter: Secondary | ICD-10-CM

## 2014-12-24 DIAGNOSIS — Y9289 Other specified places as the place of occurrence of the external cause: Secondary | ICD-10-CM | POA: Diagnosis not present

## 2014-12-24 DIAGNOSIS — I10 Essential (primary) hypertension: Secondary | ICD-10-CM | POA: Insufficient documentation

## 2014-12-24 DIAGNOSIS — M25562 Pain in left knee: Secondary | ICD-10-CM

## 2014-12-24 DIAGNOSIS — S79911A Unspecified injury of right hip, initial encounter: Secondary | ICD-10-CM | POA: Insufficient documentation

## 2014-12-24 DIAGNOSIS — S79912A Unspecified injury of left hip, initial encounter: Secondary | ICD-10-CM | POA: Diagnosis not present

## 2014-12-24 DIAGNOSIS — M25552 Pain in left hip: Secondary | ICD-10-CM

## 2014-12-24 DIAGNOSIS — Z79899 Other long term (current) drug therapy: Secondary | ICD-10-CM | POA: Insufficient documentation

## 2014-12-24 DIAGNOSIS — M25551 Pain in right hip: Secondary | ICD-10-CM

## 2014-12-24 MED ORDER — TRAMADOL-ACETAMINOPHEN 37.5-325 MG PO TABS: 1.0000 | ORAL_TABLET | Freq: Four times a day (QID) | ORAL | Status: DC | PRN

## 2014-12-24 MED ORDER — OXYCODONE-ACETAMINOPHEN 5-325 MG PO TABS
1.0000 | ORAL_TABLET | Freq: Once | ORAL | Status: AC
  Administered 2014-12-24: 1 via ORAL
  Filled 2014-12-24: qty 1
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
## 2014-12-24 NOTE — Discharge Instructions (Signed)
Take the prescribed medication as directed. °Follow-up with your primary care physician. °Return to the ED for new or worsening symptoms. ° °

## 2015-01-18 ENCOUNTER — Other Ambulatory Visit: Payer: Self-pay | Admitting: Family Medicine

## 2015-03-16 ENCOUNTER — Other Ambulatory Visit: Payer: Self-pay | Admitting: *Deleted

## 2015-03-16 DIAGNOSIS — I10 Essential (primary) hypertension: Secondary | ICD-10-CM

## 2015-03-16 MED ORDER — AMLODIPINE BESYLATE 10 MG PO TABS: 10.0000 mg | ORAL_TABLET | Freq: Every day | ORAL | Status: DC

## 2015-05-02 ENCOUNTER — Encounter (HOSPITAL_COMMUNITY): Payer: Self-pay | Admitting: Emergency Medicine

## 2015-05-02 DIAGNOSIS — Z79899 Other long term (current) drug therapy: Secondary | ICD-10-CM | POA: Insufficient documentation

## 2015-05-02 DIAGNOSIS — Y929 Unspecified place or not applicable: Secondary | ICD-10-CM | POA: Insufficient documentation

## 2015-05-02 DIAGNOSIS — E1165 Type 2 diabetes mellitus with hyperglycemia: Secondary | ICD-10-CM | POA: Insufficient documentation

## 2015-05-02 DIAGNOSIS — Y939 Activity, unspecified: Secondary | ICD-10-CM | POA: Insufficient documentation

## 2015-05-02 DIAGNOSIS — Y999 Unspecified external cause status: Secondary | ICD-10-CM | POA: Insufficient documentation

## 2015-05-02 DIAGNOSIS — R109 Unspecified abdominal pain: Secondary | ICD-10-CM | POA: Insufficient documentation

## 2015-05-02 DIAGNOSIS — Z7982 Long term (current) use of aspirin: Secondary | ICD-10-CM | POA: Insufficient documentation

## 2015-05-02 DIAGNOSIS — I10 Essential (primary) hypertension: Secondary | ICD-10-CM | POA: Diagnosis not present

## 2015-05-02 DIAGNOSIS — S39012A Strain of muscle, fascia and tendon of lower back, initial encounter: Secondary | ICD-10-CM | POA: Diagnosis not present

## 2015-05-02 DIAGNOSIS — X58XXXA Exposure to other specified factors, initial encounter: Secondary | ICD-10-CM | POA: Diagnosis not present

## 2015-05-02 DIAGNOSIS — E780 Pure hypercholesterolemia: Secondary | ICD-10-CM | POA: Insufficient documentation

## 2015-05-02 DIAGNOSIS — Z3202 Encounter for pregnancy test, result negative: Secondary | ICD-10-CM | POA: Diagnosis not present

## 2015-05-02 DIAGNOSIS — Z7951 Long term (current) use of inhaled steroids: Secondary | ICD-10-CM | POA: Insufficient documentation

## 2015-05-02 DIAGNOSIS — S3992XA Unspecified injury of lower back, initial encounter: Secondary | ICD-10-CM | POA: Diagnosis present

## 2015-05-02 LAB — CBC
HCT: 33.8 % — ABNORMAL LOW (ref 36.0–46.0)
Hemoglobin: 11.1 g/dL — ABNORMAL LOW (ref 12.0–15.0)
MCH: 25.9 pg — ABNORMAL LOW (ref 26.0–34.0)
MCHC: 32.8 g/dL (ref 30.0–36.0)
MCV: 78.8 fL (ref 78.0–100.0)
Platelets: 406 10*3/uL — ABNORMAL HIGH (ref 150–400)
RBC: 4.29 MIL/uL (ref 3.87–5.11)
RDW: 13.9 % (ref 11.5–15.5)
WBC: 8.2 10*3/uL (ref 4.0–10.5)

## 2015-05-02 LAB — BASIC METABOLIC PANEL
Anion gap: 12 (ref 5–15)
BUN: 14 mg/dL (ref 6–20)
CALCIUM: 10.3 mg/dL (ref 8.9–10.3)
CO2: 23 mmol/L (ref 22–32)
CREATININE: 1.14 mg/dL — AB (ref 0.44–1.00)
Chloride: 101 mmol/L (ref 101–111)
GFR calc Af Amer: >60 mL/min (ref >60)
GFR, EST NON AFRICAN AMERICAN: 54 mL/min — AB (ref >60)
GLUCOSE: 447 mg/dL — AB (ref 65–99)
Potassium: 3.6 mmol/L (ref 3.5–5.1)
Sodium: 136 mmol/L (ref 135–145)

## 2015-05-02 LAB — POC URINE PREG, ED: Preg Test, Ur: NEGATIVE

## 2015-05-02 NOTE — ED Notes (Signed)
Pt. reports left flank pain onset last week and right distal index finger pain/swelling onset this week . Denies hematuria or dysuria .

## 2015-05-03 ENCOUNTER — Other Ambulatory Visit: Payer: Self-pay | Admitting: Family Medicine

## 2015-05-03 ENCOUNTER — Emergency Department (HOSPITAL_COMMUNITY)
Admission: EM | Admit: 2015-05-03 | Discharge: 2015-05-03 | Disposition: A | Discharge status: Home / Self Care | Payer: Medicaid Other | Attending: Emergency Medicine | Admitting: Emergency Medicine

## 2015-05-03 DIAGNOSIS — S39012A Strain of muscle, fascia and tendon of lower back, initial encounter: Secondary | ICD-10-CM

## 2015-05-03 DIAGNOSIS — R739 Hyperglycemia, unspecified: Secondary | ICD-10-CM

## 2015-05-03 LAB — URINALYSIS, ROUTINE W REFLEX MICROSCOPIC
Bilirubin Urine: NEGATIVE
Glucose, UA: >1000 mg/dL — AB
KETONES UR: NEGATIVE mg/dL
LEUKOCYTES UA: NEGATIVE
Nitrite: NEGATIVE
PROTEIN: NEGATIVE mg/dL
Specific Gravity, Urine: 1.024 (ref 1.005–1.030)
UROBILINOGEN UA: 1 mg/dL (ref 0.0–1.0)
pH: 5 (ref 5.0–8.0)

## 2015-05-03 LAB — URINE MICROSCOPIC-ADD ON

## 2015-05-03 LAB — CBG MONITORING, ED: Glucose-Capillary: 234 mg/dL — ABNORMAL HIGH (ref 65–99)

## 2015-05-03 MED ORDER — INSULIN ASPART 100 UNIT/ML ~~LOC~~ SOLN
10.0000 [IU] | Freq: Once | SUBCUTANEOUS | Status: AC
  Administered 2015-05-03: 10 [IU] via INTRAVENOUS
  Filled 2015-05-03: qty 1

## 2015-05-03 MED ORDER — METHOCARBAMOL 500 MG PO TABS: 1000.0000 mg | ORAL_TABLET | Freq: Three times a day (TID) | ORAL | Status: DC | PRN

## 2015-05-03 MED ORDER — SODIUM CHLORIDE 0.9 % IV BOLUS (SEPSIS)
1000.0000 mL | Freq: Once | INTRAVENOUS | Status: AC
  Administered 2015-05-03: 1000 mL via INTRAVENOUS

## 2015-05-03 MED ORDER — IBUPROFEN 600 MG PO TABS: 600.0000 mg | ORAL_TABLET | Freq: Four times a day (QID) | ORAL | Status: DC | PRN

## 2015-05-03 MED ORDER — METHOCARBAMOL 500 MG PO TABS
1000.0000 mg | ORAL_TABLET | Freq: Once | ORAL | Status: AC
  Administered 2015-05-03: 1000 mg via ORAL
  Filled 2015-05-03: qty 2

## 2015-05-03 MED ORDER — KETOROLAC TROMETHAMINE 30 MG/ML IJ SOLN
30.0000 mg | Freq: Once | INTRAMUSCULAR | Status: AC
  Administered 2015-05-03: 30 mg via INTRAVENOUS
  Filled 2015-05-03: qty 1

## 2015-05-03 NOTE — ED Provider Notes (Signed)
CSN: [id number]     Arrival date & time 05/02/15  2221 History   None    This chart was scribed for Julianne Rice, MD by Forrestine Him, ED Scribe. This patient was seen in room D34C/D34C and the patient's care was started 3:09 AM.   Chief Complaint  Patient presents with  . Flank Pain   The history is provided by the patient. No language interpreter was used.    HPI Comments: Andrea [name] is a 54 y.o. female with a PMHx of HTN, DM, and hyperlipidemia who presents to the Emergency Department complaining of intermittent, ongoing, unchanged, non-radiating L flank pain x 1 week. No recent injury or trauma. Pain is exacerbated with certain movement and currently rated 8/10. No alleviating factors at this time. No OTC medications or home remedies attempted prior to arrival. She denies any urinary symptoms. No urinary or bowel incontinence. No recent fever, chills, hematuria, or dysuria. No known allergies to medications.  Past Medical History  Diagnosis Date  . Hypertension   . Diabetes mellitus   . High cholesterol   . Hyperthyroidism     Radioactive iodine ablation  . Environmental allergies   . Hx of echocardiogram     Echo 2/14: mild LVH, EF 55-60%, Gr 1 diast dysfn, mild LAE  . Hx of cardiovascular stress test     a. ETT-MV 2/14:  EF 46% (normal by echo), low risk, no ischemia or scar   Past Surgical History  Procedure Laterality Date  . Cesarean section  1983, 2001    x 2   Family History  Problem Relation Age of Onset  . Pneumonia Mother   . Diabetes Mother   . Hypertension Father   . Hypertension Sister   . Hyperlipidemia Sister   . Hypertension Brother   . Hypertension Maternal Aunt   . Hypertension Paternal Aunt   . Cancer Paternal Aunt     throat cancer  . Diabetes Maternal Grandmother   . Diabetes Paternal Grandmother   . Cancer Paternal Grandmother     pancreatic cancer   History  Substance Use Topics  . Smoking status: Never Smoker   . Smokeless  tobacco: Never Used  . Alcohol Use: No     Comment: occasional for special occasions   OB History    Gravida Para Term Preterm AB TAB SAB Ectopic Multiple Living   4 3 3  1  1   3      Review of Systems  Constitutional: Negative for fever and chills.  Respiratory: Negative for cough and shortness of breath.   Cardiovascular: Negative for chest pain.  Gastrointestinal: Negative for nausea, vomiting, abdominal pain, diarrhea and constipation.  Genitourinary: Negative for dysuria, frequency, hematuria, flank pain and difficulty urinating.  Musculoskeletal: Positive for myalgias and back pain. Negative for neck pain.  Skin: Negative for rash and wound.  Neurological: Negative for dizziness, weakness, light-headedness, numbness and headaches.  Psychiatric/Behavioral: Negative for confusion.  All other systems reviewed and are negative.     Allergies  Review of patient's allergies indicates no known allergies.  Home Medications   Prior to Admission medications   Medication Sig Start Date End Date Taking? Authorizing Provider  amLODipine (NORVASC) 10 MG tablet Take 1 tablet (10 mg total) by mouth daily. 03/16/15  Yes Virginia Crews, MD  aspirin EC 81 MG tablet Take 81 mg by mouth daily.   Yes Historical Provider, MD  atorvastatin (LIPITOR) 80 MG tablet Take 1 tablet (80 mg  total) by mouth daily. 05/27/14  Yes Virginia Crews, MD  cloNIDine (CATAPRES) 0.3 MG tablet Take 1 tablet (0.3 mg total) by mouth 2 (two) times daily. 05/11/14  Yes Virginia Crews, MD  cromolyn (OPTICROM) 4 % ophthalmic solution Place 1 drop into both eyes 2 (two) times daily.   Yes Historical Provider, MD  fexofenadine (ALLEGRA) 180 MG tablet Take 1 tablet (180 mg total) by mouth daily as needed. For seasonal allergies 02/10/14  Yes Melony Overly, MD  fluticasone [facility]) 50 MCG/ACT nasal spray Place 2 sprays into both nostrils daily. Patient taking differently: Place 2 sprays into both nostrils daily as  needed for allergies.  02/10/14  Yes Melony Overly, MD  glipiZIDE (GLUCOTROL) 10 MG tablet TAKE 1 TABLET (10 MG TOTAL) BY MOUTH DAILY BEFORE BREAKFAST. Patient taking differently: TAKE 1 TABLET BY MOUTH TWICE DAILY. 01/18/15  Yes Ashly M Gottschalk, DO  hydrochlorothiazide (HYDRODIURIL) 25 MG tablet TAKE 1 TABLET (25 MG TOTAL) BY MOUTH DAILY. 11/17/14  Yes Virginia Crews, MD  losartan (COZAAR) 100 MG tablet Take 1 tablet (100 mg total) by mouth daily. 05/11/14  Yes Virginia Crews, MD  metFORMIN (GLUCOPHAGE) 1000 MG tablet Take 1 tablet (1,000 mg total) by mouth 2 (two) times daily with a meal. 05/11/14  Yes Virginia Crews, MD  metoprolol (LOPRESSOR) 100 MG tablet Take 0.5 tablets (50 mg total) by mouth 2 (two) times daily. 05/11/14  Yes Virginia Crews, MD  montelukast (SINGULAIR) 10 MG tablet Take 1 tablet (10 mg total) by mouth at bedtime. Patient taking differently: Take 10 mg by mouth daily as needed (allergies).  02/10/14  Yes Melony Overly, MD  senna-docusate (SENOKOT-S) 8.6-50 MG per tablet Take 2 tablets by mouth at bedtime as needed for mild constipation. 09/20/14  Yes Virginia Crews, MD  triamcinolone (NASACORT) 55 MCG/ACT AERO nasal inhaler Place 2 sprays into the nose daily as needed (allergies).    Yes Historical Provider, MD  ibuprofen (ADVIL,MOTRIN) 600 MG tablet Take 1 tablet (600 mg total) by mouth every 6 (six) hours as needed for moderate pain. 05/03/15   Julianne Rice, MD  methocarbamol (ROBAXIN) 500 MG tablet Take 2 tablets (1,000 mg total) by mouth every 8 (eight) hours as needed for muscle spasms. 05/03/15   Julianne Rice, MD   Triage Vitals: BP 162/86 mmHg  Pulse 66  Temp(Src) 97.9 F (36.6 C) (Oral)  Resp 18  Ht 5' 5.5" (1.664 m)  Wt 306 lb (138.801 kg)  BMI 50.13 kg/m2  SpO2 99%   Physical Exam  Constitutional: She is oriented to person, place, and time. She appears well-developed and well-nourished. No distress.  HENT:  Head: Normocephalic and  atraumatic.  Mouth/Throat: Oropharynx is clear and moist.  Eyes: EOM are normal. Pupils are equal, round, and reactive to light.  Neck: Normal range of motion. Neck supple.  Cardiovascular: Normal rate and regular rhythm.   Pulmonary/Chest: Effort normal and breath sounds normal. No respiratory distress. She has no wheezes. She has no rales. She exhibits no tenderness.  Abdominal: Soft. Bowel sounds are normal. She exhibits no distension and no mass. There is no tenderness. There is no rebound and no guarding.  Musculoskeletal: Normal range of motion. She exhibits tenderness. She exhibits no edema.  No CVA tenderness to percussion. Patient does have left-sided paraspinal lumbar tenderness. Distal pulses intact. No calf swelling or tenderness.  Neurological: She is alert and oriented to person, place, and time.  Skin: Skin  is warm and dry. No rash noted. No erythema.  Psychiatric: She has a normal mood and affect. Her behavior is normal.  Nursing note and vitals reviewed.   ED Course  Procedures (including critical care time)  DIAGNOSTIC STUDIES: Oxygen Saturation is 97% on RA, adequate by my interpretation.    COORDINATION OF CARE: 3:10 AM- Will order BMP, urine pregnancy, and urinalysis. Discussed treatment plan with pt at bedside and pt agreed to plan.     Labs Review Labs Reviewed  BASIC METABOLIC PANEL - Abnormal; Notable for the following:    Glucose, Bld 447 (*)    Creatinine, Ser 1.14 (*)    GFR calc non Af Amer 54 (*)    All other components within normal limits  CBC - Abnormal; Notable for the following:    Hemoglobin 11.1 (*)    HCT 33.8 (*)    MCH 25.9 (*)    Platelets 406 (*)    All other components within normal limits  URINALYSIS, ROUTINE W REFLEX MICROSCOPIC (NOT AT [facility]) - Abnormal; Notable for the following:    Glucose, UA >1000 (*)    Hgb urine dipstick TRACE (*)    All other components within normal limits  URINE MICROSCOPIC-ADD ON  POC URINE PREG, ED     Imaging Review No results found.   EKG Interpretation None      MDM   Final diagnoses:  Lumbar strain, initial encounter  Hyperglycemia    I personally performed the services described in this documentation, which was scribed in my presence. The recorded information has been reviewed and is accurate.  Patient with hyperglycemia on her workup. Given IV fluids, insulin and pain medication. Patient states her pain is improved. She has no neurologic features with her back pain. No urinary symptoms. Suspect pain is muscular in nature.  Blood sugar is improved. No evidence of diabetic ketoacidosis. Patient is advised to follow-up with her primary physician to have blood sugar rechecked and medications adjusted as needed. Return precautions given.  Julianne Rice, MD 05/03/15 [phone]

## 2015-05-03 NOTE — ED Notes (Signed)
Discharge instructions and prescriptions reviewed - voiced understanding 

## 2015-05-03 NOTE — Discharge Instructions (Signed)
Muscle Strain A muscle strain is an injury that occurs when a muscle is stretched beyond its normal length. Usually a small number of muscle fibers are torn when this happens. Muscle strain is rated in degrees. First-degree strains have the least amount of muscle fiber tearing and pain. Second-degree and third-degree strains have increasingly more tearing and pain.  Usually, recovery from muscle strain takes 1-2 weeks. Complete healing takes 5-6 weeks.  CAUSES  Muscle strain happens when a sudden, violent force placed on a muscle stretches it too far. This may occur with lifting, sports, or a fall.  RISK FACTORS Muscle strain is especially common in athletes.  SIGNS AND SYMPTOMS At the site of the muscle strain, there may be:  Pain.  Bruising.  Swelling.  Difficulty using the muscle due to pain or lack of normal function. DIAGNOSIS  Your health care provider will perform a physical exam and ask about your medical history. TREATMENT  Often, the best treatment for a muscle strain is resting, icing, and applying cold compresses to the injured area.  HOME CARE INSTRUCTIONS   Use the PRICE method of treatment to promote muscle healing during the first 2-3 days after your injury. The PRICE method involves:  Protecting the muscle from being injured again.  Restricting your activity and resting the injured body part.  Icing your injury. To do this, put ice in a plastic bag. Place a towel between your skin and the bag. Then, apply the ice and leave it on from 15-20 minutes each hour. After the third day, switch to moist heat packs.  Apply compression to the injured area with a splint or elastic bandage. Be careful not to wrap it too tightly. This may interfere with blood circulation or increase swelling.  Elevate the injured body part above the level of your heart as often as you can.  Only take over-the-counter or prescription medicines for pain, discomfort, or fever as directed by your  health care provider.  Warming up prior to exercise helps to prevent future muscle strains. SEEK MEDICAL CARE IF:   You have increasing pain or swelling in the injured area.  You have numbness, tingling, or a significant loss of strength in the injured area. MAKE SURE YOU:   Understand these instructions.  Will watch your condition.  Will get help right away if you are not doing well or get worse. Document Released: 10/22/2005 Document Revised: 08/12/2013 Document Reviewed: 05/21/2013 [facility] Patient Information 2015 [place], [place]. This information is not intended to replace advice given to you by your health care provider. Make sure you discuss any questions you have with your health care provider.  Hyperglycemia Hyperglycemia occurs when the glucose (sugar) in your blood is too high. Hyperglycemia can happen for many reasons, but it most often happens to people who do not know they have diabetes or are not managing their diabetes properly.  CAUSES  Whether you have diabetes or not, there are other causes of hyperglycemia. Hyperglycemia can occur when you have diabetes, but it can also occur in other situations that you might not be as aware of, such as: Diabetes  If you have diabetes and are having problems controlling your blood glucose, hyperglycemia could occur because of some of the following reasons:  Not following your meal plan.  Not taking your diabetes medications or not taking it properly.  Exercising less or doing less activity than you normally do.  Being sick. Pre-diabetes  This cannot be ignored. Before people develop Type  2 diabetes, they almost always have "pre-diabetes." This is when your blood glucose levels are higher than normal, but not yet high enough to be diagnosed as diabetes. Research has shown that some long-term damage to the body, especially the heart and circulatory system, may already be occurring during pre-diabetes. If you take action to manage  your blood glucose when you have pre-diabetes, you may delay or prevent Type 2 diabetes from developing. Stress  If you have diabetes, you may be "diet" controlled or on oral medications or insulin to control your diabetes. However, you may find that your blood glucose is higher than usual in the hospital whether you have diabetes or not. This is often referred to as "stress hyperglycemia." Stress can elevate your blood glucose. This happens because of hormones put out by the body during times of stress. If stress has been the cause of your high blood glucose, it can be followed regularly by your caregiver. That way he/she can make sure your hyperglycemia does not continue to get worse or progress to diabetes. Steroids  Steroids are medications that act on the infection fighting system (immune system) to block inflammation or infection. One side effect can be a rise in blood glucose. Most people can produce enough extra insulin to allow for this rise, but for those who cannot, steroids make blood glucose levels go even higher. It is not unusual for steroid treatments to "uncover" diabetes that is developing. It is not always possible to determine if the hyperglycemia will go away after the steroids are stopped. A special blood test called an A1c is sometimes done to determine if your blood glucose was elevated before the steroids were started. SYMPTOMS  Thirsty.  Frequent urination.  Dry mouth.  Blurred vision.  Tired or fatigue.  Weakness.  Sleepy.  Tingling in feet or leg. DIAGNOSIS  Diagnosis is made by monitoring blood glucose in one or all of the following ways:  A1c test. This is a chemical found in your blood.  Fingerstick blood glucose monitoring.  Laboratory results. TREATMENT  First, knowing the cause of the hyperglycemia is important before the hyperglycemia can be treated. Treatment may include, but is not be limited to:  Education.  Change or adjustment in  medications.  Change or adjustment in meal plan.  Treatment for an illness, infection, etc.  More frequent blood glucose monitoring.  Change in exercise plan.  Decreasing or stopping steroids.  Lifestyle changes. HOME CARE INSTRUCTIONS   Test your blood glucose as directed.  Exercise regularly. Your caregiver will give you instructions about exercise. Pre-diabetes or diabetes which comes on with stress is helped by exercising.  Eat wholesome, balanced meals. Eat often and at regular, fixed times. Your caregiver or nutritionist will give you a meal plan to guide your sugar intake.  Being at an ideal weight is important. If needed, losing as little as 10 to 15 pounds may help improve blood glucose levels. SEEK MEDICAL CARE IF:   You have questions about medicine, activity, or diet.  You continue to have symptoms (problems such as increased thirst, urination, or weight gain). SEEK IMMEDIATE MEDICAL CARE IF:   You are vomiting or have diarrhea.  Your breath smells fruity.  You are breathing faster or slower.  You are very sleepy or incoherent.  You have numbness, tingling, or pain in your feet or hands.  You have chest pain.  Your symptoms get worse even though you have been following your caregiver's orders.  If you have  any other questions or concerns. Document Released: 04/17/2001 Document Revised: 01/14/2012 Document Reviewed: 02/18/2012 [facility] Patient Information 2015 [place], [place]. This information is not intended to replace advice given to you by your health care provider. Make sure you discuss any questions you have with your health care provider.

## 2015-07-12 ENCOUNTER — Other Ambulatory Visit: Payer: Self-pay | Admitting: *Deleted

## 2015-07-12 DIAGNOSIS — I10 Essential (primary) hypertension: Secondary | ICD-10-CM

## 2015-07-12 MED ORDER — LOSARTAN POTASSIUM 100 MG PO TABS: 100.0000 mg | ORAL_TABLET | Freq: Every day | ORAL | Status: DC

## 2015-08-04 ENCOUNTER — Other Ambulatory Visit: Payer: Self-pay

## 2015-08-04 DIAGNOSIS — Z1231 Encounter for screening mammogram for malignant neoplasm of breast: Secondary | ICD-10-CM

## 2015-08-12 ENCOUNTER — Ambulatory Visit
Admission: RE | Admit: 2015-08-12 | Discharge: 2015-08-12 | Disposition: A | Discharge status: Home / Self Care | Payer: Medicaid Other | Source: Ambulatory Visit

## 2015-08-12 DIAGNOSIS — Z1231 Encounter for screening mammogram for malignant neoplasm of breast: Secondary | ICD-10-CM

## 2015-09-15 ENCOUNTER — Other Ambulatory Visit: Payer: Self-pay | Admitting: *Deleted

## 2015-09-15 MED ORDER — HYDROCHLOROTHIAZIDE 25 MG PO TABS: ORAL_TABLET | ORAL | Status: DC

## 2015-09-22 ENCOUNTER — Emergency Department (HOSPITAL_COMMUNITY)
Admission: EM | Admit: 2015-09-22 | Discharge: 2015-09-22 | Disposition: A | Discharge status: Home / Self Care | Payer: Medicaid Other | Attending: Emergency Medicine | Admitting: Emergency Medicine

## 2015-09-22 ENCOUNTER — Encounter (HOSPITAL_COMMUNITY): Payer: Self-pay | Admitting: Cardiology

## 2015-09-22 DIAGNOSIS — I10 Essential (primary) hypertension: Secondary | ICD-10-CM | POA: Diagnosis not present

## 2015-09-22 DIAGNOSIS — R1013 Epigastric pain: Secondary | ICD-10-CM | POA: Insufficient documentation

## 2015-09-22 DIAGNOSIS — Z7982 Long term (current) use of aspirin: Secondary | ICD-10-CM | POA: Diagnosis not present

## 2015-09-22 DIAGNOSIS — R51 Headache: Secondary | ICD-10-CM | POA: Insufficient documentation

## 2015-09-22 DIAGNOSIS — E119 Type 2 diabetes mellitus without complications: Secondary | ICD-10-CM | POA: Diagnosis not present

## 2015-09-22 DIAGNOSIS — Z7951 Long term (current) use of inhaled steroids: Secondary | ICD-10-CM | POA: Insufficient documentation

## 2015-09-22 DIAGNOSIS — Z9889 Other specified postprocedural states: Secondary | ICD-10-CM | POA: Diagnosis not present

## 2015-09-22 DIAGNOSIS — E7800 Pure hypercholesterolemia, unspecified: Secondary | ICD-10-CM | POA: Diagnosis not present

## 2015-09-22 DIAGNOSIS — Z79899 Other long term (current) drug therapy: Secondary | ICD-10-CM | POA: Insufficient documentation

## 2015-09-22 DIAGNOSIS — Z7984 Long term (current) use of oral hypoglycemic drugs: Secondary | ICD-10-CM | POA: Insufficient documentation

## 2015-09-22 DIAGNOSIS — R197 Diarrhea, unspecified: Secondary | ICD-10-CM | POA: Diagnosis not present

## 2015-09-22 LAB — CBC
HCT: 37.7 % (ref 36.0–46.0)
HEMOGLOBIN: 12.2 g/dL (ref 12.0–15.0)
MCH: 25.4 pg — ABNORMAL LOW (ref 26.0–34.0)
MCHC: 32.4 g/dL (ref 30.0–36.0)
MCV: 78.4 fL (ref 78.0–100.0)
PLATELETS: 363 10*3/uL (ref 150–400)
RBC: 4.81 MIL/uL (ref 3.87–5.11)
RDW: 15.3 % (ref 11.5–15.5)
WBC: 6.9 10*3/uL (ref 4.0–10.5)

## 2015-09-22 LAB — COMPREHENSIVE METABOLIC PANEL
ALK PHOS: 64 U/L (ref 38–126)
ALT: 18 U/L (ref 14–54)
AST: 17 U/L (ref 15–41)
Albumin: 3.8 g/dL (ref 3.5–5.0)
Anion gap: 9 (ref 5–15)
BUN: 14 mg/dL (ref 6–20)
CALCIUM: 10.4 mg/dL — AB (ref 8.9–10.3)
CO2: 22 mmol/L (ref 22–32)
Chloride: 108 mmol/L (ref 101–111)
Creatinine, Ser: 1.07 mg/dL — ABNORMAL HIGH (ref 0.44–1.00)
GFR, EST NON AFRICAN AMERICAN: 57 mL/min — AB (ref >60)
Glucose, Bld: 122 mg/dL — ABNORMAL HIGH (ref 65–99)
Potassium: 3.3 mmol/L — ABNORMAL LOW (ref 3.5–5.1)
Sodium: 139 mmol/L (ref 135–145)
Total Bilirubin: 0.5 mg/dL (ref 0.3–1.2)
Total Protein: 8.3 g/dL — ABNORMAL HIGH (ref 6.5–8.1)

## 2015-09-22 LAB — URINALYSIS, ROUTINE W REFLEX MICROSCOPIC
Bilirubin Urine: NEGATIVE
Glucose, UA: NEGATIVE mg/dL
Ketones, ur: NEGATIVE mg/dL
LEUKOCYTES UA: NEGATIVE
NITRITE: NEGATIVE
PROTEIN: 100 mg/dL — AB
SPECIFIC GRAVITY, URINE: 1.021 (ref 1.005–1.030)
pH: 5 (ref 5.0–8.0)

## 2015-09-22 LAB — URINE MICROSCOPIC-ADD ON

## 2015-09-22 LAB — LIPASE, BLOOD: Lipase: 44 U/L (ref 11–51)

## 2015-09-22 MED ORDER — POTASSIUM CHLORIDE CRYS ER 20 MEQ PO TBCR
40.0000 meq | EXTENDED_RELEASE_TABLET | Freq: Once | ORAL | Status: AC
  Administered 2015-09-22: 40 meq via ORAL
  Filled 2015-09-22: qty 2

## 2015-09-22 NOTE — ED Provider Notes (Signed)
CSN: [id number]     Arrival date & time 09/22/15  1131 History   First MD Initiated Contact with Patient 09/22/15 1454     Chief Complaint  Patient presents with  . Abdominal Pain  . Headache  . Diarrhea     (Consider location/radiation/quality/duration/timing/severity/associated sxs/prior Treatment) HPI Comments: 54yo F w/ PMH including T2DM, HTN, HLD who p/w diarrhea and abd pain. Pt states that several days ago she began having non-bloody diarrhea associated w/ crampy upper abd pain which is improved after BMs. She states she has an episode of diarrhea any time she eats. She reports mild intermittent headaches but no sudden onset of severe headache. No visual changes or extremity weakness. No fevers, vomiting, cough/cold sx, recent abx use, recent travel, or sick contacts. No unusual foods. No urinary symptoms.  Patient is a 54 y.o. female presenting with abdominal pain, headaches, and diarrhea. The history is provided by the patient.  Abdominal Pain Associated symptoms: diarrhea   Headache Associated symptoms: abdominal pain and diarrhea   Diarrhea Associated symptoms: abdominal pain and headaches     Past Medical History  Diagnosis Date  . Hypertension   . Diabetes mellitus   . High cholesterol   . Hyperthyroidism     Radioactive iodine ablation  . Environmental allergies   . Hx of echocardiogram     Echo 2/14: mild LVH, EF 55-60%, Gr 1 diast dysfn, mild LAE  . Hx of cardiovascular stress test     a. ETT-MV 2/14:  EF 46% (normal by echo), low risk, no ischemia or scar   Past Surgical History  Procedure Laterality Date  . Cesarean section  1983, 2001    x 2   Family History  Problem Relation Age of Onset  . Pneumonia Mother   . Diabetes Mother   . Hypertension Father   . Hypertension Sister   . Hyperlipidemia Sister   . Hypertension Brother   . Hypertension Maternal Aunt   . Hypertension Paternal Aunt   . Cancer Paternal Aunt     throat cancer  . Diabetes  Maternal Grandmother   . Diabetes Paternal Grandmother   . Cancer Paternal Grandmother     pancreatic cancer   Social History  Substance Use Topics  . Smoking status: Never Smoker   . Smokeless tobacco: Never Used  . Alcohol Use: No     Comment: occasional for special occasions   OB History    Gravida Para Term Preterm AB TAB SAB Ectopic Multiple Living   4 3 3  1  1   3      Review of Systems  Gastrointestinal: Positive for abdominal pain and diarrhea.  Neurological: Positive for headaches.    10 Systems reviewed and are negative for acute change except as noted in the HPI.   Allergies  Review of patient's allergies indicates no known allergies.  Home Medications   Prior to Admission medications   Medication Sig Start Date End Date Taking? Authorizing Provider  amLODipine (NORVASC) 10 MG tablet Take 1 tablet (10 mg total) by mouth daily. 03/16/15  Yes Virginia Crews, MD  aspirin EC 81 MG tablet Take 81 mg by mouth daily.   Yes Historical Provider, MD  atorvastatin (LIPITOR) 80 MG tablet Take 1 tablet (80 mg total) by mouth daily. 05/27/14  Yes Virginia Crews, MD  cloNIDine (CATAPRES) 0.3 MG tablet Take 1 tablet (0.3 mg total) by mouth 2 (two) times daily. 05/11/14  Yes Virginia Crews, MD  cromolyn (OPTICROM) 4 % ophthalmic solution Place 1 drop into both eyes 2 (two) times daily as needed. EYE ALLERGIES   Yes Historical Provider, MD  fluticasone (FLONASE) 50 MCG/ACT nasal spray Place 2 sprays into both nostrils daily. Patient taking differently: Place 2 sprays into both nostrils daily as needed for allergies.  02/10/14  Yes Melony Overly, MD  glipiZIDE (GLUCOTROL) 10 MG tablet TAKE 1 TABLET (10 MG TOTAL) BY MOUTH DAILY BEFORE BREAKFAST. 05/04/15  Yes Virginia Crews, MD  hydrochlorothiazide (HYDRODIURIL) 25 MG tablet TAKE 1 TABLET (25 MG TOTAL) BY MOUTH DAILY. 09/15/15  Yes Virginia Crews, MD  losartan (COZAAR) 100 MG tablet Take 1 tablet (100 mg total) by  mouth daily. 07/12/15  Yes Virginia Crews, MD  metFORMIN (GLUCOPHAGE) 1000 MG tablet Take 1 tablet (1,000 mg total) by mouth 2 (two) times daily with a meal. 05/11/14  Yes Virginia Crews, MD  methocarbamol (ROBAXIN) 500 MG tablet Take 2 tablets (1,000 mg total) by mouth every 8 (eight) hours as needed for muscle spasms. 05/03/15  Yes Julianne Rice, MD  metoprolol (LOPRESSOR) 100 MG tablet Take 0.5 tablets (50 mg total) by mouth 2 (two) times daily. 05/11/14  Yes Virginia Crews, MD  senna-docusate (SENOKOT-S) 8.6-50 MG per tablet Take 2 tablets by mouth at bedtime as needed for mild constipation. 09/20/14   Virginia Crews, MD   BP 138/96 mmHg  Pulse 82  Temp(Src) 98.4 F (36.9 C) (Oral)  Resp 18  Ht 5\' 7"  (1.702 m)  Wt 306 lb (138.801 kg)  BMI 47.92 kg/m2  SpO2 95%  LMP 07/25/2015 Physical Exam  Constitutional: She is oriented to person, place, and time. She appears well-developed and well-nourished. No distress.  HENT:  Head: Normocephalic and atraumatic.  Moist mucous membranes  Eyes: Conjunctivae are normal. Pupils are equal, round, and reactive to light.  Neck: Neck supple.  Cardiovascular: Normal rate, regular rhythm and normal heart sounds.   No murmur heard. Pulmonary/Chest: Effort normal and breath sounds normal.  Abdominal: Soft. Bowel sounds are normal. She exhibits no distension.  Mild midepigastric TTP w/ no rebound or guarding  Musculoskeletal: She exhibits no edema.  Neurological: She is alert and oriented to person, place, and time. She exhibits normal muscle tone.  Fluent speech  Skin: Skin is warm and dry.  Psychiatric: She has a normal mood and affect. Judgment normal.  Nursing note and vitals reviewed.   ED Course  Procedures (including critical care time) Labs Review Labs Reviewed  COMPREHENSIVE METABOLIC PANEL - Abnormal; Notable for the following:    Potassium 3.3 (*)    Glucose, Bld 122 (*)    Creatinine, Ser 1.07 (*)    Calcium 10.4  (*)    Total Protein 8.3 (*)    GFR calc non Af Amer 57 (*)    All other components within normal limits  CBC - Abnormal; Notable for the following:    MCH 25.4 (*)    All other components within normal limits  URINALYSIS, ROUTINE W REFLEX MICROSCOPIC (NOT AT [facility]) - Abnormal; Notable for the following:    APPearance CLOUDY (*)    Hgb urine dipstick MODERATE (*)    Protein, ur 100 (*)    All other components within normal limits  URINE MICROSCOPIC-ADD ON - Abnormal; Notable for the following:    Squamous Epithelial / LPF 6-30 (*)    Bacteria, UA FEW (*)    All other components within normal limits  LIPASE,  BLOOD    Imaging Review No results found. I have personally reviewed and evaluated these lab results as part of my medical decision-making.   EKG Interpretation None     Medications  potassium chloride SA (K-DUR,KLOR-CON) CR tablet 40 mEq (40 mEq Oral Given 09/22/15 1553)    MDM   Final diagnoses:  Diarrhea, unspecified type    Pt p/w several days of non-bloody diarrhea associated w/ crampy abd pain. Pt well appearing w/ reassuring VS. No lower abd tenderness on exam. Labs reassuring, mild hypokalemia for which I gave oral replacement. No evidence of dehydration and pt tolerating PO at home. No sudden onset of severe headache or any red flag symptoms with headache to warrant any head imaging at this time. I have instructed on supportive care including hydration, monitoring glucose, and taking probiotics. Pt voiced understanding of plan as well as return precautions and discharged in satisfactory condition.    Sharlett Iles, MD 09/22/15 [phone]

## 2015-09-22 NOTE — Discharge Instructions (Signed)
You may start taking probiotics (lactobacillus) which is an over-the-counter supplement that may help shorten the time that you have diarrhea.

## 2015-09-22 NOTE — ED Notes (Signed)
Reports abd pain, diarrhea and headache for the past couple of days. States any time she eats she has diarrhea soon after.

## 2015-10-03 ENCOUNTER — Other Ambulatory Visit: Payer: Self-pay | Admitting: *Deleted

## 2015-10-03 DIAGNOSIS — I10 Essential (primary) hypertension: Secondary | ICD-10-CM

## 2015-10-03 MED ORDER — CLONIDINE HCL 0.3 MG PO TABS: 0.3000 mg | ORAL_TABLET | Freq: Two times a day (BID) | ORAL | Status: DC

## 2015-10-03 MED ORDER — METOPROLOL TARTRATE 100 MG PO TABS: 50.0000 mg | ORAL_TABLET | Freq: Two times a day (BID) | ORAL | Status: DC

## 2015-10-03 NOTE — Telephone Encounter (Signed)
Again, patient has not been seen in >41yr.  1 refill given, but no further refills until patient has f/u appt for HTN.  Will have staff schedule f/u appt with patient.  Virginia Crews, MD, MPH PGY-2,  [facility] 10/03/2015 1:15 PM

## 2015-10-03 NOTE — Telephone Encounter (Signed)
Patient has not been seen at [facility] in over 1 year to follow-up on hypertension.  1 refill given, but patient needs appt before further refills given.  Virginia Crews, MD, MPH PGY-2,  [facility] 10/03/2015 9:11 AM

## 2015-10-07 NOTE — Telephone Encounter (Signed)
LMOVM for pt to return call.  Please schedule appt when she returns call. Fleeger, Salome Spotted

## 2016-01-02 ENCOUNTER — Other Ambulatory Visit: Payer: Self-pay | Admitting: Family Medicine

## 2016-02-08 ENCOUNTER — Other Ambulatory Visit: Payer: Self-pay | Admitting: *Deleted

## 2016-02-08 DIAGNOSIS — E119 Type 2 diabetes mellitus without complications: Secondary | ICD-10-CM

## 2016-02-08 MED ORDER — METFORMIN HCL 1000 MG PO TABS: 1000.0000 mg | ORAL_TABLET | Freq: Two times a day (BID) | ORAL | Status: DC

## 2016-03-19 ENCOUNTER — Emergency Department (HOSPITAL_COMMUNITY): Payer: Medicaid Other

## 2016-03-19 ENCOUNTER — Encounter (HOSPITAL_COMMUNITY): Payer: Self-pay | Admitting: Emergency Medicine

## 2016-03-19 ENCOUNTER — Emergency Department (HOSPITAL_COMMUNITY)
Admission: EM | Admit: 2016-03-19 | Discharge: 2016-03-19 | Disposition: A | Discharge status: Home / Self Care | Payer: Medicaid Other | Attending: Emergency Medicine | Admitting: Emergency Medicine

## 2016-03-19 DIAGNOSIS — Z7982 Long term (current) use of aspirin: Secondary | ICD-10-CM | POA: Diagnosis not present

## 2016-03-19 DIAGNOSIS — Z79899 Other long term (current) drug therapy: Secondary | ICD-10-CM | POA: Diagnosis not present

## 2016-03-19 DIAGNOSIS — M545 Low back pain, unspecified: Secondary | ICD-10-CM

## 2016-03-19 DIAGNOSIS — E119 Type 2 diabetes mellitus without complications: Secondary | ICD-10-CM | POA: Diagnosis not present

## 2016-03-19 DIAGNOSIS — Z7984 Long term (current) use of oral hypoglycemic drugs: Secondary | ICD-10-CM | POA: Diagnosis not present

## 2016-03-19 DIAGNOSIS — R079 Chest pain, unspecified: Secondary | ICD-10-CM | POA: Diagnosis not present

## 2016-03-19 DIAGNOSIS — E7800 Pure hypercholesterolemia, unspecified: Secondary | ICD-10-CM | POA: Diagnosis not present

## 2016-03-19 DIAGNOSIS — Z7951 Long term (current) use of inhaled steroids: Secondary | ICD-10-CM | POA: Diagnosis not present

## 2016-03-19 DIAGNOSIS — M546 Pain in thoracic spine: Secondary | ICD-10-CM | POA: Diagnosis not present

## 2016-03-19 DIAGNOSIS — I10 Essential (primary) hypertension: Secondary | ICD-10-CM | POA: Diagnosis not present

## 2016-03-19 LAB — URINE MICROSCOPIC-ADD ON

## 2016-03-19 LAB — COMPREHENSIVE METABOLIC PANEL
ALT: 17 U/L (ref 14–54)
AST: 17 U/L (ref 15–41)
Albumin: 4.3 g/dL (ref 3.5–5.0)
Alkaline Phosphatase: 76 U/L (ref 38–126)
Anion gap: 9 (ref 5–15)
BILIRUBIN TOTAL: 0.8 mg/dL (ref 0.3–1.2)
BUN: 16 mg/dL (ref 6–20)
CHLORIDE: 109 mmol/L (ref 101–111)
CO2: 22 mmol/L (ref 22–32)
CREATININE: 1.1 mg/dL — AB (ref 0.44–1.00)
Calcium: 10.8 mg/dL — ABNORMAL HIGH (ref 8.9–10.3)
GFR, EST NON AFRICAN AMERICAN: 55 mL/min — AB (ref >60)
Glucose, Bld: 187 mg/dL — ABNORMAL HIGH (ref 65–99)
POTASSIUM: 3.4 mmol/L — AB (ref 3.5–5.1)
Sodium: 140 mmol/L (ref 135–145)
TOTAL PROTEIN: 8.9 g/dL — AB (ref 6.5–8.1)

## 2016-03-19 LAB — URINALYSIS, ROUTINE W REFLEX MICROSCOPIC
Bilirubin Urine: NEGATIVE
Glucose, UA: >1000 mg/dL — AB
Ketones, ur: NEGATIVE mg/dL
LEUKOCYTES UA: NEGATIVE
NITRITE: NEGATIVE
PROTEIN: NEGATIVE mg/dL
SPECIFIC GRAVITY, URINE: 1.019 (ref 1.005–1.030)
pH: 5 (ref 5.0–8.0)

## 2016-03-19 LAB — CBC WITH DIFFERENTIAL/PLATELET
BASOS ABS: 0 10*3/uL (ref 0.0–0.1)
Basophils Relative: 0 %
EOS ABS: 0.1 10*3/uL (ref 0.0–0.7)
EOS PCT: 1 %
HCT: 38.8 % (ref 36.0–46.0)
HEMOGLOBIN: 12.5 g/dL (ref 12.0–15.0)
LYMPHS ABS: 3.5 10*3/uL (ref 0.7–4.0)
Lymphocytes Relative: 39 %
MCH: 25.4 pg — ABNORMAL LOW (ref 26.0–34.0)
MCHC: 32.2 g/dL (ref 30.0–36.0)
MCV: 78.9 fL (ref 78.0–100.0)
Monocytes Absolute: 0.6 10*3/uL (ref 0.1–1.0)
Monocytes Relative: 7 %
NEUTROS PCT: 53 %
Neutro Abs: 4.9 10*3/uL (ref 1.7–7.7)
PLATELETS: 398 10*3/uL (ref 150–400)
RBC: 4.92 MIL/uL (ref 3.87–5.11)
RDW: 14.9 % (ref 11.5–15.5)
WBC: 9.1 10*3/uL (ref 4.0–10.5)

## 2016-03-19 LAB — LIPASE, BLOOD: LIPASE: 59 U/L — AB (ref 11–51)

## 2016-03-19 LAB — I-STAT TROPONIN, ED
TROPONIN I, POC: 0 ng/mL (ref 0.00–0.08)
Troponin i, poc: 0 ng/mL (ref 0.00–0.08)

## 2016-03-19 MED ORDER — TRAMADOL HCL 50 MG PO TABS: 50.0000 mg | ORAL_TABLET | Freq: Four times a day (QID) | ORAL | Status: DC | PRN

## 2016-03-19 MED ORDER — METHOCARBAMOL 500 MG PO TABS: 1000.0000 mg | ORAL_TABLET | Freq: Four times a day (QID) | ORAL | Status: DC

## 2016-03-19 NOTE — ED Notes (Signed)
Upon rounding pt denies CP reports 7/10 lower back pain.

## 2016-03-19 NOTE — ED Notes (Signed)
Di Kindle assist pt to nearby restroom to void prior to arrival to room. When pt arrives to room from triage with collect EKG and labs as ordered.

## 2016-03-19 NOTE — ED Notes (Signed)
[place], PA at bedside.  

## 2016-03-19 NOTE — ED Notes (Signed)
RN starting IV, drawing labs 

## 2016-03-19 NOTE — Discharge Instructions (Signed)
Please read and follow all provided instructions.  Your diagnoses today include:  1. Bilateral low back pain without sciatica   2. Chest pain, unspecified chest pain type     Tests performed today include:  An EKG of your heart  A chest x-ray  Cardiac enzymes - a blood test for heart muscle damage  Blood counts and electrolytes  Vital signs. See below for your results today.   Medications prescribed:   Tramadol - narcotic-like pain medication  DO NOT drive or perform any activities that require you to be awake and alert because this medicine can make you drowsy.    Robaxin (methocarbamol) - muscle relaxer medication  DO NOT drive or perform any activities that require you to be awake and alert because this medicine can make you drowsy.   Take any prescribed medications only as directed.  Follow-up instructions: Please follow-up with your primary care provider as soon as you can for further evaluation of your symptoms. Follow-up with your cardiologist in the next 1 week for further evaluation of your chest pain.   Return instructions:  SEEK IMMEDIATE MEDICAL ATTENTION IF:  You have severe chest pain, especially if the pain is crushing or pressure-like and spreads to the arms, back, neck, or jaw, or if you have sweating, nausea (feeling sick to your stomach), or shortness of breath. THIS IS AN EMERGENCY. Don't wait to see if the pain will go away. Get medical help at once. Call 911 or [phone] (operator). DO NOT drive yourself to the hospital.   Your chest pain gets worse and does not go away with rest.   You have an attack of chest pain lasting longer than usual, despite rest and treatment with the medications your caregiver has prescribed.   You wake from sleep with chest pain or shortness of breath.  You feel dizzy or faint.  You have chest pain not typical of your usual pain for which you originally saw your caregiver.   You have any other emergent concerns regarding your  health.  Additional Information: Chest pain comes from many different causes. Your caregiver has diagnosed you as having chest pain that is not specific for one problem, but does not require admission.  You are at low risk for an acute heart condition or other serious illness.   Your vital signs today were: BP 131/80 mmHg   Pulse 66   Temp(Src) 97.9 F (36.6 C) (Oral)   Resp 18   SpO2 98% If your blood pressure (BP) was elevated above 135/85 this visit, please have this repeated by your doctor within one month. --------------

## 2016-03-19 NOTE — ED Provider Notes (Signed)
MSE was initiated and I personally evaluated the patient and placed orders (if any) at  3:48 PM on Mar 19, 2016.  Andrea [name] is a 55 y.o. female DM, HTN, Hyperthyroidism s/p iodine ablation, HLD, who presents to the Emergency Department complaining of gradual onset, constant, 8/10, stabbing, lower back pain radiating to bilateral sides x 3 weeks. The pain is exacerbated with movement. No known injury to the back but she does mention doing a lot of bending and twisting. No recent heavy lifting. She has taken Tylenol and Ibuprofen with mild relief. She has not applied ice or heat to the area. Pt also complains of gradual onset, constant, left sided chest pain for the past 3 days, causing her concern. She did not mention the chest pain at time of triage but states she's worried about it. Denies fever, chills, shortness of breath, syncope, dizziness, lightheadedness, diaphoresis, abdominal pain, nausea, vomiting, diarrhea, constipation, urinary or bowel incontinence, saddle anesthesia, weakness, numbness, tingling, or any other associated symptoms. No hx cancer or IVDA. No recent prlonged travel, surgery, or immobilization. No hx DVT/PE.   BP 177/99 mmHg  Pulse 91  Temp(Src) 97.9 F (36.6 C) (Oral)  Resp 18  SpO2 100% Gen: afebrile, VSS although mildly hypertensive, NAD HEENT: EOMI, MMM Resp: CTAB in all lung fields, no w/r/r, no hypoxia or increased WOB, speaking in full sentences, SpO2 100% on RA  CV: RRR, nl s1/s2, no m/r/g, distal pulses intact, no pedal edema; Chest wall with mild L sided TTP without crepitus, deformities, or retractions  Abd: Soft, NTND, +BS throughout, no r/g/r, neg murphy's, neg mcburney's, no CVA TTP  MsK: moving all extremities with ease; low back with diffuse b/l paraspinous muscle TTP and spasm, no midline spinal TTP, no step offs or deformities Neuro: A&O x4  Given the pt's CP complaint, will start work up and move the the back.  The patient appears stable so that  the remainder of the MSE may be completed by another provider.  Dewitt Hoes Camprubi-Soms, PA-C 03/19/16 [place], MD 03/28/16 [phone]

## 2016-03-19 NOTE — ED Notes (Signed)
20 g removed from LAC, catheter intact, site unremarkable, gauze dressing applied.

## 2016-03-19 NOTE — ED Provider Notes (Signed)
CSN: [id number]     Arrival date & time 03/19/16  1441 History   First MD Initiated Contact with Patient 03/19/16 1540     Chief Complaint  Patient presents with  . Back Pain     (Consider location/radiation/quality/duration/timing/severity/associated sxs/prior Treatment) HPI Comments: Patient presents today with chief complaint of back pain in her middle to lower back which has been ongoing for approximately 3 weeks. Pain radiates into her bilateral flanks. This has been constant for the past 3 weeks and started without injury. It is worse with movement in certain positions. Patient presented to the emergency department today because it was causing her to have difficulty caring for her children. Patient has been taking ibuprofen and extra strength Tylenol which helps temporarily. No lower extremity pain. Patient denies warning symptoms of back pain including: fecal incontinence, urinary retention or overflow incontinence, night sweats, waking from sleep with back pain, unexplained fevers or weight loss, h/o cancer, IVDU, recent trauma.   In addition, patient complains of 4 days of middle chest pain. Pain is nonradiating. It occurs even while at rest. Not change with deep breathing or movement. Pain waxes and wanes. Patient states when she has pain occurs approximately 45 minutes at a time. No associated diaphoresis, shortness of breath, palpitations. Patient has not had any nausea or vomiting. No associated abdominal pains patient has a risk factor profile significant for strong family history, high cholesterol, hypertension, diabetes. She does not smoke. No previous stress testing. Patient denies risk factors for pulmonary embolism including: unilateral leg swelling, history of DVT/PE/other blood clots, use of exogenous hormones, recent immobilizations, recent surgery, recent travel (>4hr segment), malignancy, hemoptysis.      Patient is a 55 y.o. female presenting with back pain. The history is  provided by the patient.  Back Pain Associated symptoms: chest pain   Associated symptoms: no abdominal pain, no dysuria, no fever, no numbness, no pelvic pain and no weakness     Past Medical History  Diagnosis Date  . Hypertension   . Diabetes mellitus   . High cholesterol   . Hyperthyroidism     Radioactive iodine ablation  . Environmental allergies   . Hx of echocardiogram     Echo 2/14: mild LVH, EF 55-60%, Gr 1 diast dysfn, mild LAE  . Hx of cardiovascular stress test     a. ETT-MV 2/14:  EF 46% (normal by echo), low risk, no ischemia or scar   Past Surgical History  Procedure Laterality Date  . Cesarean section  1983, 2001    x 2   Family History  Problem Relation Age of Onset  . Pneumonia Mother   . Diabetes Mother   . Hypertension Father   . Hypertension Sister   . Hyperlipidemia Sister   . Hypertension Brother   . Hypertension Maternal Aunt   . Hypertension Paternal Aunt   . Cancer Paternal Aunt     throat cancer  . Diabetes Maternal Grandmother   . Diabetes Paternal Grandmother   . Cancer Paternal Grandmother     pancreatic cancer   Social History  Substance Use Topics  . Smoking status: Never Smoker   . Smokeless tobacco: Never Used  . Alcohol Use: No     Comment: occasional for special occasions   OB History    Gravida Para Term Preterm AB TAB SAB Ectopic Multiple Living   4 3 3  1  1   3      Review of Systems  Constitutional: Negative  for fever, diaphoresis and unexpected weight change.  Eyes: Negative for redness.  Respiratory: Negative for cough and shortness of breath.   Cardiovascular: Positive for chest pain. Negative for palpitations and leg swelling.  Gastrointestinal: Negative for nausea, vomiting, abdominal pain and constipation.       Negative for fecal incontinence.   Genitourinary: Negative for dysuria, hematuria, flank pain, vaginal bleeding, vaginal discharge and pelvic pain.       Negative for urinary incontinence or retention.   Musculoskeletal: Positive for back pain. Negative for neck pain.  Skin: Negative for rash.  Neurological: Negative for syncope, weakness, light-headedness and numbness.       Denies saddle paresthesias.  Psychiatric/Behavioral: The patient is not nervous/anxious.       Allergies  Review of patient's allergies indicates no known allergies.  Home Medications   Prior to Admission medications   Medication Sig Start Date End Date Taking? Authorizing Provider  amLODipine (NORVASC) 10 MG tablet Take 1 tablet (10 mg total) by mouth daily. 03/16/15   Virginia Crews, MD  aspirin EC 81 MG tablet Take 81 mg by mouth daily.    Historical Provider, MD  atorvastatin (LIPITOR) 80 MG tablet Take 1 tablet (80 mg total) by mouth daily. 05/27/14   Virginia Crews, MD  cloNIDine (CATAPRES) 0.3 MG tablet Take 1 tablet (0.3 mg total) by mouth 2 (two) times daily. 10/03/15   Virginia Crews, MD  cromolyn (OPTICROM) 4 % ophthalmic solution Place 1 drop into both eyes 2 (two) times daily as needed. EYE ALLERGIES    Historical Provider, MD  fluticasone (FLONASE) 50 MCG/ACT nasal spray Place 2 sprays into both nostrils daily. Patient taking differently: Place 2 sprays into both nostrils daily as needed for allergies.  02/10/14   Melony Overly, MD  glipiZIDE (GLUCOTROL) 10 MG tablet TAKE 1 TABLET (10 MG TOTAL) BY MOUTH DAILY BEFORE BREAKFAST. 01/02/16   Virginia Crews, MD  hydrochlorothiazide (HYDRODIURIL) 25 MG tablet TAKE 1 TABLET (25 MG TOTAL) BY MOUTH DAILY. 09/15/15   Virginia Crews, MD  losartan (COZAAR) 100 MG tablet Take 1 tablet (100 mg total) by mouth daily. 07/12/15   Virginia Crews, MD  metFORMIN (GLUCOPHAGE) 1000 MG tablet Take 1 tablet (1,000 mg total) by mouth 2 (two) times daily with a meal. 02/08/16   Virginia Crews, MD  methocarbamol (ROBAXIN) 500 MG tablet Take 2 tablets (1,000 mg total) by mouth every 8 (eight) hours as needed for muscle spasms. 05/03/15   Julianne Rice,  MD  metoprolol (LOPRESSOR) 100 MG tablet Take 0.5 tablets (50 mg total) by mouth 2 (two) times daily. 10/03/15   Virginia Crews, MD  senna-docusate (SENOKOT-S) 8.6-50 MG per tablet Take 2 tablets by mouth at bedtime as needed for mild constipation. 09/20/14   Virginia Crews, MD   BP 177/99 mmHg  Pulse 91  Temp(Src) 97.9 F (36.6 C) (Oral)  Resp 18  SpO2 100% Physical Exam  Constitutional: She appears well-developed and well-nourished.  HENT:  Head: Normocephalic and atraumatic.  Mouth/Throat: Oropharynx is clear and moist.  Eyes: Conjunctivae are normal. Right eye exhibits no discharge. Left eye exhibits no discharge.  Neck: Normal range of motion. Neck supple.  Cardiovascular: Normal rate, regular rhythm and normal heart sounds.   No murmur heard. Pulmonary/Chest: Breath sounds normal. No respiratory distress. She has no wheezes. She has no rales.  Abdominal: Soft. Bowel sounds are normal. There is no tenderness. There is no rebound, no guarding  and no CVA tenderness.  Musculoskeletal: Normal range of motion.       Cervical back: She exhibits normal range of motion, no tenderness and no bony tenderness.       Thoracic back: She exhibits tenderness (bilateral). She exhibits normal range of motion and no bony tenderness.       Lumbar back: She exhibits tenderness (bilateral). She exhibits normal range of motion and no bony tenderness.  No step-off noted with palpation of spine.   Neurological: She is alert. She has normal strength and normal reflexes. No sensory deficit.  5/5 strength in entire lower extremities bilaterally. No sensation deficit.   Skin: Skin is warm and dry. No rash noted.  Psychiatric: She has a normal mood and affect.  Nursing note and vitals reviewed.   ED Course  Procedures (including critical care time) Labs Review Labs Reviewed  COMPREHENSIVE METABOLIC PANEL - Abnormal; Notable for the following:    Potassium 3.4 (*)    Glucose, Bld 187 (*)     Creatinine, Ser 1.10 (*)    Calcium 10.8 (*)    Total Protein 8.9 (*)    GFR calc non Af Amer 55 (*)    All other components within normal limits  CBC WITH DIFFERENTIAL/PLATELET - Abnormal; Notable for the following:    MCH 25.4 (*)    All other components within normal limits  URINALYSIS, ROUTINE W REFLEX MICROSCOPIC (NOT AT [facility]) - Abnormal; Notable for the following:    Glucose, UA >1000 (*)    Hgb urine dipstick MODERATE (*)    All other components within normal limits  LIPASE, BLOOD - Abnormal; Notable for the following:    Lipase 59 (*)    All other components within normal limits  URINE MICROSCOPIC-ADD ON - Abnormal; Notable for the following:    Squamous Epithelial / LPF 0-5 (*)    Bacteria, UA RARE (*)    All other components within normal limits  I-STAT TROPOININ, ED  Randolm Idol, ED    Imaging Review Dg Chest 2 View  03/19/2016  CLINICAL DATA:  Left-sided chest pain. EXAM: CHEST  2 VIEW COMPARISON:  12/13/2012 FINDINGS: Stable top-normal heart size. Stable tortuosity of the thoracic aorta. There is no evidence of pulmonary edema, consolidation, pneumothorax, nodule or pleural fluid. The bony thorax is unremarkable. IMPRESSION: No acute findings. Stable top-normal heart size and tortuosity of the thoracic aorta. Electronically Signed   By: Aletta Edouard M.D.   On: 03/19/2016 16:22   I have personally reviewed and evaluated these images and lab results as part of my medical decision-making.   EKG Interpretation   Date/Time:  Monday Mar 19 2016 15:59:28 EDT Ventricular Rate:  86 PR Interval:  157 QRS Duration: 105 QT Interval:  407 QTC Calculation: 487 R Axis:   7 Text Interpretation:  Sinus rhythm Probable left atrial enlargement Left  ventricular hypertrophy Confirmed by Wilson Singer  MD, STEPHEN ([id number]) on  03/19/2016 4:07:28 PM       4:15 PM Patient seen and examined. Work-up initiated. EKG reviewed. Known LVH, grade 1 diastolic dysfunction as of 2014 on ECHO.  EF was normal.   Vital signs reviewed and are as follows: BP 177/99 mmHg  Pulse 91  Temp(Src) 97.9 F (36.6 C) (Oral)  Resp 18  SpO2 100%  7:20 PM Delta trop neg. EKG unchanged. D/c to home with tx for back pain.   Patient to follow-up with her cardiologist in the next 1 week.   Patient was counseled  to return with severe chest pain, especially if the pain is crushing or pressure-like and spreads to the arms, back, neck, or jaw, or if they have sweating, nausea, or shortness of breath with the pain. They were encouraged to call 911 with these symptoms.   They were also told to return if their chest pain gets worse and does not go away with rest, they have an attack of chest pain lasting longer than usual despite rest and treatment with the medications their caregiver has prescribed, if they wake from sleep with chest pain or shortness of breath, if they feel dizzy or faint, if they have chest pain not typical of their usual pain, or if they have any other emergent concerns regarding their health.  The patient verbalized understanding and agreed.   Patient was counseled on back pain precautions and told to do activity as tolerated but do not lift, push, or pull heavy objects more than 10 pounds for the next week.  Patient counseled to use ice or heat on back for no longer than 15 minutes every hour.   Patient prescribed muscle relaxer and counseled on proper use of muscle relaxant medication.    Urged patient not to drink alcohol, drive, or perform any other activities that requires focus while taking either of these medications.  Patient urged to follow-up with PCP if pain does not improve with treatment and rest or if pain becomes recurrent. Urged to return with worsening severe pain, loss of bowel or bladder control, trouble walking.   The patient verbalizes understanding and agrees with the plan.   MDM   Final diagnoses:  Bilateral low back pain without sciatica  Chest pain,  unspecified chest pain type   Back pain: This is patient's primary complaint today. It is reproducible And musculoskeletal in nature. No neurological deficits. Patient is ambulatory. No warning symptoms of back pain including: fecal incontinence, urinary retention or overflow incontinence, night sweats, waking from sleep with back pain, unexplained fevers or weight loss, h/o cancer, IVDU, recent trauma. No concern for cauda equina, epidural abscess, or other serious cause of back pain. Conservative measures such as rest, ice/heat and pain medicine indicated with PCP follow-up if no improvement with conservative management.   Chest pain: Patient with chest pain x 4 days. Feel patient is low risk for ACS given history (poor story for ACS/MI), negative troponin(s), normal/unchanged EKG. No concerning risk factors for DVT/PE. HEART = 3. Patient does have cardiology follow-up and she is encouraged to do so in the next week for evaluation of her chest pain. Return instructions discussed as above and patient seems reliable to return if symptoms persist.      Carlisle Cater, PA-C 03/19/16 [place], MD 03/20/16 [id number]

## 2016-03-19 NOTE — ED Notes (Signed)
Pt c/o lower back pain radiating to the abdomen x3 weeks. Denies urinary sx.  Pt has taken tylenol and ibuprofen with no relief.

## 2016-03-19 NOTE — ED Notes (Signed)
Pt stated "I have 2 more b/p pills to take tonight.  My b/p runs kinda high".

## 2016-04-09 ENCOUNTER — Encounter: Payer: Self-pay | Admitting: Internal Medicine

## 2016-04-09 ENCOUNTER — Ambulatory Visit (INDEPENDENT_AMBULATORY_CARE_PROVIDER_SITE_OTHER): Payer: Medicaid Other | Admitting: Internal Medicine

## 2016-04-09 ENCOUNTER — Ambulatory Visit: Payer: Medicaid Other | Admitting: Family Medicine

## 2016-04-09 VITALS — BP 129/77 | HR 62 | Temp 97.9°F | Ht 67.0 in | Wt 275.8 lb

## 2016-04-09 DIAGNOSIS — E119 Type 2 diabetes mellitus without complications: Secondary | ICD-10-CM

## 2016-04-09 DIAGNOSIS — R634 Abnormal weight loss: Secondary | ICD-10-CM | POA: Insufficient documentation

## 2016-04-09 DIAGNOSIS — G44229 Chronic tension-type headache, not intractable: Secondary | ICD-10-CM

## 2016-04-09 DIAGNOSIS — R51 Headache: Secondary | ICD-10-CM

## 2016-04-09 DIAGNOSIS — R519 Headache, unspecified: Secondary | ICD-10-CM | POA: Insufficient documentation

## 2016-04-09 DIAGNOSIS — I10 Essential (primary) hypertension: Secondary | ICD-10-CM

## 2016-04-09 LAB — CBC
HEMATOCRIT: 35.6 % (ref 35.0–45.0)
HEMOGLOBIN: 11.7 g/dL (ref 11.7–15.5)
MCH: 25.3 pg — ABNORMAL LOW (ref 27.0–33.0)
MCHC: 32.9 g/dL (ref 32.0–36.0)
MCV: 77.1 fL — ABNORMAL LOW (ref 80.0–100.0)
MPV: 9.4 fL (ref 7.5–12.5)
Platelets: 403 10*3/uL — ABNORMAL HIGH (ref 140–400)
RBC: 4.62 MIL/uL (ref 3.80–5.10)
RDW: 15.6 % — ABNORMAL HIGH (ref 11.0–15.0)
WBC: 6.7 10*3/uL (ref 3.8–10.8)

## 2016-04-09 LAB — GLUCOSE, CAPILLARY: Glucose-Capillary: 235 mg/dL — ABNORMAL HIGH (ref 65–99)

## 2016-04-09 LAB — POCT GLYCOSYLATED HEMOGLOBIN (HGB A1C): Hemoglobin A1C: 10.8

## 2016-04-09 MED ORDER — AMLODIPINE BESYLATE 10 MG PO TABS: 10.0000 mg | ORAL_TABLET | Freq: Every day | ORAL | Status: DC

## 2016-04-09 MED ORDER — CLONIDINE HCL 0.3 MG PO TABS: 0.3000 mg | ORAL_TABLET | Freq: Two times a day (BID) | ORAL | Status: DC

## 2016-04-09 MED ORDER — METFORMIN HCL 1000 MG PO TABS: 1000.0000 mg | ORAL_TABLET | Freq: Two times a day (BID) | ORAL | Status: DC

## 2016-04-09 MED ORDER — HYDROCHLOROTHIAZIDE 25 MG PO TABS: ORAL_TABLET | ORAL | Status: DC

## 2016-04-09 MED ORDER — LOSARTAN POTASSIUM 100 MG PO TABS: 50.0000 mg | ORAL_TABLET | Freq: Every day | ORAL | Status: DC

## 2016-04-09 MED ORDER — METOPROLOL SUCCINATE ER 50 MG PO TB24: 50.0000 mg | ORAL_TABLET | Freq: Every day | ORAL | Status: DC

## 2016-04-09 NOTE — Patient Instructions (Addendum)
Please follow up in week. Please keep track of headaches with a headache diary

## 2016-04-09 NOTE — Assessment & Plan Note (Signed)
BP, well controlled at this visit.  - Discussed blood pressure regiment - changes made discussed below  - Educated patient that clonidine needs to be taken as BID, she voiced understanding  - Patient was taking 50 mg metoprolol tartate once daily, however should be BID medication - changed to metoprolol XL  - Patient taking decreased dose of losartan due to side effect of fatigue, therefore decrease losartan to 50 mg once daily from 100 mg daily  - BMET today to check kidney function and potassium  - Discussed that patient likely needed closer follow up

## 2016-04-09 NOTE — Assessment & Plan Note (Addendum)
Subjective hx of weight loss. Per patient usually about 300 lbs, today at this visit 276 lbs. Last visit 8 months ago patient weight about 30 lbs more in the ED. However has not been seen in our clinic for about 2 years.  - Per discussion with Dr. Erin Hearing will continue to follow this for now - Will get TSH, given hx of thyroid dysfunction in the past.

## 2016-04-09 NOTE — Progress Notes (Signed)
[name] Kerrin Mo, MD Phone: [phone]  Reason For Visit: Blood pressure, has not seen in the clinic since 2015.  # HTN  - Compliance indicates compliance with medications - Overall forgets to take blood pressure medication about once a week - Patient was unaware that clonidine and metropolol were BID medications  - Check blood pressure at home, average is 150/90. This month check blood pressure once over the las month.  Max bp 170s - Taking Losartan at 50 mg daily as she feels that it makes her sleep   - Denies SOB, dizziness - Endorses palpation, was seen in the ED for chest pain at [facility] 2 weeks ago and plans on following up with outpatient cardiology    Headache  - 2 years of headache, 15 days out of a month. Has relief from headache after taking Ibuprofen. Headache is sometimes frontal vs occipital, sometimes radiates into the neck. Sometimes wakes patient up out of her sleep. Patient states feeling stressed at times, as her daughter died 6 years ago, and now she has to take care of her grandchildren full time. No progressive worsening of the headache. About the same over the past two years. No hx of headaches in the past. No hx of migraines. Denies every smoking. Aunt throat cancer, pancreatic cancer, breast cancer. No associated trauma   - no nausea/vomitting  - Mammogram about 6 months  - No hx of colonoscopy in the past   Endorses Weight loss  - Going on about six months per patient, decreased appetite, - Denies trying to intentionally loss weight.  Exercise but not significantly increased - About 8 months ago, patient was 304 lbs, now 276 lbs  - Hx of thyroid problems, indicates increased senstivity to cold, but not heat though initially endorsed sensitivity to heat  - Menstrual cycle changes, over the last 6 months or so has stopped having a menstrual cycle which she states is due to "metopause"    Past Medical History Reviewed problem list.    Medications- reviewed and updated No additions to family history Social history- patient is a non smoker  Objective: BP 129/77 mmHg  Pulse 62  Temp(Src) 97.9 F (36.6 C) (Oral)  Ht 5\' 7"  (1.702 m)  Wt 275 lb 12.8 oz (125.102 kg)  BMI 43.19 kg/m2 Gen: NAD, alert, cooperative with exam HEENT: NCAT, EOMI, PERRL, attempted to view optic disc, however was not able to see.  NecK: no thyromegaly noted or thyroid nodule  CV: RRR, good S1/S2, no murmur, radial pulses  Neuro: Cn 2-7 intact Strength equal & normal in upper & lower extremities Able to walk on heels and toes.   Balance normal , finger to nose   Assessment/Plan: See problem based a/p Essential hypertension, benign BP, well controlled at this visit.  - Discussed blood pressure regiment - changes made discussed below  - Educated patient that clonidine needs to be taken as BID, she voiced understanding  - Patient was taking 50 mg metoprolol tartate once daily, however should be BID medication - changed to metoprolol XL  - Patient taking decreased dose of losartan due to side effect of fatigue, therefore decrease losartan to 50 mg once daily from 100 mg daily  - BMET today to check kidney function and potassium  - Discussed that patient likely needed closer follow up  Weight loss Subjective hx of weight loss. Per patient usually about 300 lbs, today at this visit 276 lbs. Last visit 8 months ago  patient weight about 30 lbs more in the ED. However has not been seen in our clinic for about 2 years.  - Per discussion with Dr. Erin Hearing will continue to follow this for now - Will get TSH, given hx of thyroid dysfunction in the past.    Headache 2 year hx of headache, headache almost every other day. Sometimes frontal/occipatal. Radiates to the neck at times. May wake patient up out of there sleep once a month. No previous hx of Migraines. No trauma noted. Normal Neuro exam. Hx of subjective weight loss per patient. Consider  tension type headache vs. Secondary cause of headache  - Red flags from hx include awakening at times from sleep, onset after age 55 years, hx of weight loss (thought timeframe unclear) Vs. Normal neuro exam, 2 years of headache without progression, lack of nausea vomiting  - Will get patient to give complete a headache diary - onset, duration, symptoms  - Follow up in a week

## 2016-04-09 NOTE — Assessment & Plan Note (Addendum)
2 year hx of headache, headache almost every other day. Sometimes frontal/occipatal. Radiates to the neck at times. May wake patient up out of there sleep once a month. No previous hx of Migraines. No trauma noted. Normal Neuro exam. Hx of subjective weight loss per patient. Consider tension type headache vs. Secondary cause of headache  - Red flags from hx include awakening at times from sleep, onset after age 55 years, hx of weight loss (thought timeframe unclear) Vs. Normal neuro exam, 2 years of headache without progression, lack of nausea vomiting  - Will get patient to give complete a headache diary - onset, duration, symptoms  - Follow up in a week

## 2016-04-10 ENCOUNTER — Telehealth: Payer: Self-pay | Admitting: Internal Medicine

## 2016-04-10 LAB — BASIC METABOLIC PANEL WITH GFR
BUN: 12 mg/dL (ref 7–25)
CALCIUM: 10.4 mg/dL (ref 8.6–10.4)
CO2: 26 mmol/L (ref 20–31)
CREATININE: 0.91 mg/dL (ref 0.50–1.05)
Chloride: 103 mmol/L (ref 98–110)
GFR, Est African American: 82 mL/min (ref >=60)
GFR, Est Non African American: 71 mL/min (ref >=60)
GLUCOSE: 215 mg/dL — AB (ref 65–99)
Potassium: 3.9 mmol/L (ref 3.5–5.3)
SODIUM: 139 mmol/L (ref 135–146)

## 2016-04-10 LAB — TSH: TSH: 0.71 mIU/L

## 2016-04-10 NOTE — Telephone Encounter (Signed)
Called patient regarding results. Patient to follow up in week regarding her recent A1C 10.8. Has not been seen for diabetes in over  2 years.

## 2016-04-17 ENCOUNTER — Ambulatory Visit: Payer: Medicaid Other | Admitting: Internal Medicine

## 2016-04-20 ENCOUNTER — Telehealth: Payer: Self-pay | Admitting: *Deleted

## 2016-04-20 NOTE — Telephone Encounter (Signed)
Prior Authorization received from [facility] for Losartan. Formulary and PA form placed in provider box for completion. Derl Barrow, RN

## 2016-04-23 NOTE — Telephone Encounter (Signed)
Filled out  Prior Auth on 04/23/2016. Given to [facility] for review.

## 2016-04-24 ENCOUNTER — Telehealth: Payer: Self-pay | Admitting: Family Medicine

## 2016-04-24 NOTE — Telephone Encounter (Signed)
Called patient to discuss prior-auth for losartan.  Appears patient has been taking losartan for a long time, but insurance will not cover unless she has previously taken an ACE inhibitor like lisinopril or enalapril.  Patient not available.  VM left asking patient to return call to clinic.  Please assess if she thinks she previously took another medicine for BP when she returns call or let me know when to call back.  I will substitute lisinopril if she has not tried this before.  Virginia Crews, MD, MPH PGY-2,  [facility] 04/24/2016 5:12 PM

## 2016-04-25 NOTE — Telephone Encounter (Signed)
Called again.  Left VM asking to call back to clinic.  Virginia Crews, MD, MPH PGY-2,  [facility] 04/25/2016 2:12 PM

## 2016-04-27 ENCOUNTER — Ambulatory Visit: Payer: Medicaid Other | Admitting: Obstetrics & Gynecology

## 2016-04-28 MED ORDER — LISINOPRIL 10 MG PO TABS
10.0000 mg | ORAL_TABLET | Freq: Every day | ORAL | Status: DC
Start: 2016-04-28 — End: 2016-06-01

## 2016-04-28 NOTE — Telephone Encounter (Signed)
Called patient again.  She states she has never taken ACEi.  She has no allergies to medications.  Advised that we will need to switch to Lisinopril as Losartan will not be authorized without trying ACEi first.  Stop Losartan 50mg  daily and start Lisinopril 10mg  daily. Patient voices understanding.  Patient to make f/u appt in 1-2 weeks to f/u on BP.  Virginia Crews, MD, MPH PGY-2,  [facility] 04/28/2016 9:46 AM

## 2016-05-24 ENCOUNTER — Ambulatory Visit: Payer: Medicaid Other | Admitting: Family Medicine

## 2016-06-01 ENCOUNTER — Ambulatory Visit (INDEPENDENT_AMBULATORY_CARE_PROVIDER_SITE_OTHER): Payer: Medicaid Other | Admitting: Family Medicine

## 2016-06-01 ENCOUNTER — Encounter: Payer: Self-pay | Admitting: Family Medicine

## 2016-06-01 VITALS — BP 158/98 | HR 84 | Temp 98.0°F | Ht 67.0 in | Wt 279.0 lb

## 2016-06-01 DIAGNOSIS — I10 Essential (primary) hypertension: Secondary | ICD-10-CM

## 2016-06-01 DIAGNOSIS — M170 Bilateral primary osteoarthritis of knee: Secondary | ICD-10-CM

## 2016-06-01 DIAGNOSIS — E119 Type 2 diabetes mellitus without complications: Secondary | ICD-10-CM

## 2016-06-01 MED ORDER — DICLOFENAC SODIUM 1 % TD GEL: 2.0000 g | Freq: Four times a day (QID) | TRANSDERMAL | Status: DC

## 2016-06-01 MED ORDER — GLIPIZIDE 10 MG PO TABS: 10.0000 mg | ORAL_TABLET | Freq: Two times a day (BID) | ORAL | 2 refills | Status: DC

## 2016-06-01 MED ORDER — DICLOFENAC SODIUM 1 % TD GEL: 2.0000 g | Freq: Four times a day (QID) | TRANSDERMAL | 2 refills | Status: DC

## 2016-06-01 NOTE — Assessment & Plan Note (Signed)
Poorly controlled today Patient only took her medications about one hour ago She thinks that if she took her medications regularly in the morning her blood pressure be well controlled She refuses any increases in doses today Follow-up in nurse clinic in one week for blood pressure check and consider increasing losartan dose at that point Continue current medication regimen for now

## 2016-06-01 NOTE — Progress Notes (Signed)
   Subjective:   Andrea [name] is a 55 y.o. female with a history of HTN, T2 DM, morbid obesity, HLD, anxiety, depression here for follow-up of diabetes and hypertension  T2DM - Checking BG at home: yes - fasting 179-200 - Medications: metformin 1000mg  BID, glipizide 10mg  daily - Compliance: thinks she is 98% at taking it - Diet: eating less junk food and trying to switch to unsweet - Exercise: walking daily - limited by knees and hips - eye exam: needs - foot exam: needs - microalbumin: n/a ARB - denies symptoms of hypoglycemia, polyuria, polydipsia  HTN: - Medications: Toprol XL 50mg  daily, HCTZ 25mg  daily, Amlodipine 10mg  daily, clonidine 0.3mg  BID, losartan 50mg  daily - Compliance: about 6/7 days per week - Checking BP at home: Yes, 169-170/98-99 in the last few days - Denies any SOB, CP, vision changes, LE edema, medication SEs, or symptoms of hypotension   Review of Systems:  Per HPI.   Social History: never smoker  Objective:  BP (!) 158/98   Pulse 84   Temp 98 F (36.7 C) (Oral)   Ht 5\' 7"  (1.702 m)   Wt 279 lb (126.6 kg)   SpO2 98%   BMI 43.70 kg/m   Gen:  55 y.o. female in NAD  HEENT: NCAT, MMM, EOMI, PERRL, anicteric sclerae CV: RRR, no MRG Resp: Non-labored, CTAB, no wheezes noted Ext: WWP, no edema MSK: No obvious deformities, gait intact Neuro: Alert and oriented, speech normal      Chemistry      Component Value Date/Time   NA 139 04/09/2016 1213   K 3.9 04/09/2016 1213   CL 103 04/09/2016 1213   CO2 26 04/09/2016 1213   BUN 12 04/09/2016 1213   CREATININE 0.91 04/09/2016 1213      Component Value Date/Time   CALCIUM 10.4 04/09/2016 1213   ALKPHOS 76 03/19/2016 1606   AST 17 03/19/2016 1606   ALT 17 03/19/2016 1606   BILITOT 0.8 03/19/2016 1606      Lab Results  Component Value Date   WBC 6.7 04/09/2016   HGB 11.7 04/09/2016   HCT 35.6 04/09/2016   MCV 77.1 (L) 04/09/2016   PLT 403 (H) 04/09/2016   Lab Results  Component Value  Date   TSH 0.71 04/09/2016   Lab Results  Component Value Date   HGBA1C 10.8 04/09/2016   Assessment & Plan:     Andrea [name] is a 55 y.o. female here for   Type II diabetes mellitus ([place]) Poorly controlled, last A1c 10.8 Continue metformin 1000 g twice a day Increase glipizide to 10 mg twice a day Follow-up in 2 months for repeat A1c Advised patient to schedule eye exam Foot exam completed today  Essential hypertension, benign Poorly controlled today Patient only took her medications about one hour ago She thinks that if she took her medications regularly in the morning her blood pressure be well controlled She refuses any increases in doses today Follow-up in nurse clinic in one week for blood pressure check and consider increasing losartan dose at that point Continue current medication regimen for now  Morbid obesity Counseled on diet and exercise      Virginia Crews, MD MPH PGY-3,  [facility] 06/01/2016  5:24 PM

## 2016-06-01 NOTE — Assessment & Plan Note (Signed)
-  Counseled on diet and exercise.

## 2016-06-01 NOTE — Patient Instructions (Signed)
Basis you again today. Continue your current medications but do not take your lisinopril. Increase your glipizide to twice daily dosing for your diabetes. Continue to work on [employer] and exercise.  Come back in about 1 week for a nurse visit for blood pressure check.  Come back to see me in about 3 months for diabetes and blood pressure follow-up.  Take care, Dr. Jacinto Reap

## 2016-06-01 NOTE — Assessment & Plan Note (Signed)
Poorly controlled, last A1c 10.8 Continue metformin 1000 g twice a day Increase glipizide to 10 mg twice a day Follow-up in 2 months for repeat A1c Advised patient to schedule eye exam Foot exam completed today

## 2016-06-11 ENCOUNTER — Other Ambulatory Visit: Payer: Self-pay | Admitting: *Deleted

## 2016-06-11 DIAGNOSIS — I10 Essential (primary) hypertension: Secondary | ICD-10-CM

## 2016-06-11 MED ORDER — CLONIDINE HCL 0.3 MG PO TABS
0.3000 mg | ORAL_TABLET | Freq: Two times a day (BID) | ORAL | 0 refills | Status: DC
Start: 2016-06-11 — End: 2016-07-06

## 2016-06-18 ENCOUNTER — Other Ambulatory Visit: Payer: Self-pay | Admitting: *Deleted

## 2016-06-18 DIAGNOSIS — I10 Essential (primary) hypertension: Secondary | ICD-10-CM

## 2016-06-18 MED ORDER — AMLODIPINE BESYLATE 10 MG PO TABS: 10.0000 mg | ORAL_TABLET | Freq: Every day | ORAL | 3 refills | Status: DC

## 2016-06-18 NOTE — Telephone Encounter (Signed)
Medication refilled

## 2016-07-06 ENCOUNTER — Other Ambulatory Visit: Payer: Self-pay | Admitting: Family Medicine

## 2016-07-06 DIAGNOSIS — I10 Essential (primary) hypertension: Secondary | ICD-10-CM

## 2016-07-06 MED ORDER — LISINOPRIL 10 MG PO TABS: 10.0000 mg | ORAL_TABLET | Freq: Every day | ORAL | 1 refills | Status: DC

## 2016-09-13 ENCOUNTER — Other Ambulatory Visit: Payer: Self-pay | Admitting: Internal Medicine

## 2016-09-13 DIAGNOSIS — I10 Essential (primary) hypertension: Secondary | ICD-10-CM

## 2016-10-03 ENCOUNTER — Encounter (HOSPITAL_COMMUNITY): Payer: Self-pay

## 2016-10-03 ENCOUNTER — Inpatient Hospital Stay (HOSPITAL_COMMUNITY)
Admission: AD | Admit: 2016-10-03 | Discharge: 2016-10-03 | Disposition: A | Discharge status: Home / Self Care | Payer: Medicaid Other | Source: Ambulatory Visit | Attending: Obstetrics & Gynecology | Admitting: Obstetrics & Gynecology

## 2016-10-03 DIAGNOSIS — Z8249 Family history of ischemic heart disease and other diseases of the circulatory system: Secondary | ICD-10-CM | POA: Insufficient documentation

## 2016-10-03 DIAGNOSIS — Z7982 Long term (current) use of aspirin: Secondary | ICD-10-CM | POA: Insufficient documentation

## 2016-10-03 DIAGNOSIS — E119 Type 2 diabetes mellitus without complications: Secondary | ICD-10-CM | POA: Insufficient documentation

## 2016-10-03 DIAGNOSIS — Z79899 Other long term (current) drug therapy: Secondary | ICD-10-CM | POA: Insufficient documentation

## 2016-10-03 DIAGNOSIS — E7800 Pure hypercholesterolemia, unspecified: Secondary | ICD-10-CM | POA: Diagnosis not present

## 2016-10-03 DIAGNOSIS — Z7984 Long term (current) use of oral hypoglycemic drugs: Secondary | ICD-10-CM | POA: Insufficient documentation

## 2016-10-03 DIAGNOSIS — Z833 Family history of diabetes mellitus: Secondary | ICD-10-CM | POA: Diagnosis not present

## 2016-10-03 DIAGNOSIS — I10 Essential (primary) hypertension: Secondary | ICD-10-CM | POA: Diagnosis not present

## 2016-10-03 DIAGNOSIS — N939 Abnormal uterine and vaginal bleeding, unspecified: Secondary | ICD-10-CM

## 2016-10-03 HISTORY — DX: Benign neoplasm of connective and other soft tissue, unspecified: D21.9

## 2016-10-03 LAB — WET PREP, GENITAL
CLUE CELLS WET PREP: NONE SEEN
Sperm: NONE SEEN
Trich, Wet Prep: NONE SEEN
YEAST WET PREP: NONE SEEN

## 2016-10-03 LAB — CBC
HCT: 33.9 % — ABNORMAL LOW (ref 36.0–46.0)
HEMOGLOBIN: 11.2 g/dL — AB (ref 12.0–15.0)
MCH: 25.7 pg — ABNORMAL LOW (ref 26.0–34.0)
MCHC: 33 g/dL (ref 30.0–36.0)
MCV: 77.8 fL — ABNORMAL LOW (ref 78.0–100.0)
Platelets: 459 10*3/uL — ABNORMAL HIGH (ref 150–400)
RBC: 4.36 MIL/uL (ref 3.87–5.11)
RDW: 15 % (ref 11.5–15.5)
WBC: 16.5 10*3/uL — AB (ref 4.0–10.5)

## 2016-10-03 LAB — POCT PREGNANCY, URINE: PREG TEST UR: NEGATIVE

## 2016-10-03 MED ORDER — KETOROLAC TROMETHAMINE 60 MG/2ML IM SOLN
60.0000 mg | Freq: Once | INTRAMUSCULAR | Status: AC
  Administered 2016-10-03: 60 mg via INTRAMUSCULAR
  Filled 2016-10-03: qty 2

## 2016-10-03 MED ORDER — MEGESTROL ACETATE 40 MG PO TABS: 80.0000 mg | ORAL_TABLET | Freq: Two times a day (BID) | ORAL | 90 refills | Status: DC

## 2016-10-03 NOTE — MAU Note (Signed)
Pt back to lobby, B/P reported to Rasche,NP and info that pt has not taken her meds today.

## 2016-10-03 NOTE — MAU Note (Signed)
Pt reports lower abd pain x 3 days, started bleeding off/on x 3 weeks ago. Passes clots sometimes. Pt states she has always had abnormal periods but is still having periods.

## 2016-10-03 NOTE — MAU Provider Note (Signed)
History     CSN: [id number]  Arrival date and time: 10/03/16 1933   First Provider Initiated Contact with Patient 10/03/16 2218      Chief Complaint  Patient presents with  . Vaginal Bleeding   Vaginal Bleeding  The patient's primary symptoms include pelvic pain and vaginal bleeding. This is a new problem. Episode onset: about one month ago.  The problem occurs intermittently. The problem has been unchanged. Pain severity now: 8.5/10  The problem affects both sides. She is not pregnant. Associated symptoms include abdominal pain. Pertinent negatives include no chills, constipation, diarrhea, dysuria, fever, frequency, nausea, urgency or vomiting. The vaginal discharge was normal. The vaginal bleeding is typical of menses (will stop and start ). She has been passing clots (about the size of apple ). She has not been passing tissue. Nothing aggravates the symptoms. She has tried NSAIDs for the symptoms. The treatment provided mild relief. She is sexually active. She uses nothing for contraception. Menstrual history: LMP: around the end of October.  (Fibroids )   Past Medical History:  Diagnosis Date  . Diabetes mellitus   . Environmental allergies   . Fibroid   . High cholesterol   . Hx of cardiovascular stress test    a. ETT-MV 2/14:  EF 46% (normal by echo), low risk, no ischemia or scar  . Hx of echocardiogram    Echo 2/14: mild LVH, EF 55-60%, Gr 1 diast dysfn, mild LAE  . Hypertension   . Hyperthyroidism    Radioactive iodine ablation    Past Surgical History:  Procedure Laterality Date  . CESAREAN SECTION  1983, 2001   x 2    Family History  Problem Relation Age of Onset  . Pneumonia Mother   . Diabetes Mother   . Hypertension Father   . Hypertension Sister   . Hyperlipidemia Sister   . Hypertension Brother   . Hypertension Maternal Aunt   . Hypertension Paternal Aunt   . Cancer Paternal Aunt     throat cancer  . Diabetes Maternal Grandmother   . Diabetes  Paternal Grandmother   . Cancer Paternal Grandmother     pancreatic cancer    Social History  Substance Use Topics  . Smoking status: Never Smoker  . Smokeless tobacco: Never Used  . Alcohol use No     Comment: occasional for special occasions    Allergies: No Known Allergies  Prescriptions Prior to Admission  Medication Sig Dispense Refill Last Dose  . amLODipine (NORVASC) 10 MG tablet Take 1 tablet (10 mg total) by mouth daily. 90 tablet 3   . aspirin EC 81 MG tablet Take 81 mg by mouth daily.   03/19/2016 at Unknown time  . cloNIDine (CATAPRES) 0.3 MG tablet TAKE 1 TABLET BY MOUTH TWICE A DAY 60 tablet 0   . cromolyn (OPTICROM) 4 % ophthalmic solution Place 1 drop into both eyes 2 (two) times daily as needed. EYE ALLERGIES   Past Week at Unknown time  . diclofenac sodium (VOLTAREN) 1 % GEL Apply 2 g topically 4 (four) times daily. 100 g 2   . fluticasone (FLONASE) 50 MCG/ACT nasal spray Place 2 sprays into both nostrils daily. (Patient taking differently: Place 2 sprays into both nostrils daily as needed for allergies. ) 16 g 6 2 months  . glipiZIDE (GLUCOTROL) 10 MG tablet Take 1 tablet (10 mg total) by mouth 2 (two) times daily before a meal. 180 tablet 2   . hydrochlorothiazide (HYDRODIURIL) 25 MG  tablet TAKE 1 TABLET (25 MG TOTAL) BY MOUTH DAILY. 90 tablet 2   . lisinopril (PRINIVIL,ZESTRIL) 10 MG tablet Take 1 tablet (10 mg total) by mouth daily. Will need clinic appointment for future refills. 30 tablet 1   . metFORMIN (GLUCOPHAGE) 1000 MG tablet Take 1 tablet (1,000 mg total) by mouth 2 (two) times daily with a meal. 180 tablet 3   . metoprolol succinate (TOPROL XL) 50 MG 24 hr tablet Take 1 tablet (50 mg total) by mouth daily. Take with or immediately following a meal. 30 tablet 6     Review of Systems  Constitutional: Negative for chills and fever.  Gastrointestinal: Positive for abdominal pain. Negative for constipation, diarrhea, nausea and vomiting.  Genitourinary:  Positive for pelvic pain and vaginal bleeding. Negative for dysuria, frequency and urgency.   Physical Exam   Blood pressure 190/90, pulse 80, temperature 98.6 F (37 C), temperature source Oral, resp. rate 18, last menstrual period 09/12/2016, SpO2 99 %.  Physical Exam  Nursing note and vitals reviewed. Constitutional: She is oriented to person, place, and time. She appears well-developed and well-nourished. No distress.  HENT:  Head: Normocephalic.  Cardiovascular: Normal rate.   Respiratory: Effort normal.  GI: Soft. There is no tenderness. There is no rebound.  Genitourinary:  Genitourinary Comments:  External: no lesion Vagina: moderate amount of blood and clots in the vagina.  Cervix: pink, smooth, no CMT Uterus: NSSC Adnexa: NT   Neurological: She is alert and oriented to person, place, and time.  Skin: Skin is warm and dry.  Psychiatric: She has a normal mood and affect.    Results for orders placed or performed during the hospital encounter of 10/03/16 (from the past 24 hour(s))  Pregnancy, urine POC     Status: None   Collection Time: 10/03/16  8:27 PM  Result Value Ref Range   Preg Test, Ur NEGATIVE NEGATIVE  Wet prep, genital     Status: Abnormal   Collection Time: 10/03/16 10:25 PM  Result Value Ref Range   Yeast Wet Prep HPF POC NONE SEEN NONE SEEN   Trich, Wet Prep NONE SEEN NONE SEEN   Clue Cells Wet Prep HPF POC NONE SEEN NONE SEEN   WBC, Wet Prep HPF POC MANY (A) NONE SEEN   Sperm NONE SEEN   CBC     Status: Abnormal   Collection Time: 10/03/16 10:34 PM  Result Value Ref Range   WBC 16.5 (H) 4.0 - 10.5 K/uL   RBC 4.36 3.87 - 5.11 MIL/uL   Hemoglobin 11.2 (L) 12.0 - 15.0 g/dL   HCT 33.9 (L) 36.0 - 46.0 %   MCV 77.8 (L) 78.0 - 100.0 fL   MCH 25.7 (L) 26.0 - 34.0 pg   MCHC 33.0 30.0 - 36.0 g/dL   RDW 15.0 11.5 - 15.5 %   Platelets 459 (H) 150 - 400 K/uL    MAU Course  Procedures  MDM  Assessment and Plan   1. Abnormal uterine bleeding (AUB)     DC home Comfort measures reviewed  Bleeding precautions RX: megace 80mg  BID  Return to MAU as needed FU with OB as planned  [place] for [place] [facility] Follow up.   Specialty:  Obstetrics and Gynecology Contact information: [place], [place] 10/03/2016, 10:19 PM

## 2016-10-03 NOTE — Discharge Instructions (Signed)
Abnormal Uterine Bleeding Abnormal uterine bleeding means bleeding from the vagina that is not your normal menstrual period. This can be:  Bleeding or spotting between periods.  Bleeding after sex (sexual intercourse).  Bleeding that is heavier or more than normal.  Periods that last longer than usual.  Bleeding after menopause. There are many problems that may cause this. Treatment will depend on the cause of the bleeding. Any kind of bleeding that is not normal should be reviewed by your doctor. Follow these instructions at home: Watch your condition for any changes. These actions may lessen any discomfort you are having:  Do not use tampons or douches as told by your doctor.  Change your pads often. You should get regular pelvic exams and Pap tests. Keep all appointments for tests as told by your doctor. Contact a doctor if:  You are bleeding for more than 1 week.  You feel dizzy at times. Get help right away if:  You pass out.  You have to change pads every 15 to 30 minutes.  You have belly pain.  You have a fever.  You become sweaty or weak.  You are passing large blood clots from the vagina.  You feel sick to your stomach (nauseous) and throw up (vomit). This information is not intended to replace advice given to you by your health care provider. Make sure you discuss any questions you have with your health care provider. Document Released: 08/19/2009 Document Revised: 03/29/2016 Document Reviewed: 05/21/2013 Elsevier Interactive Patient Education  2017 [employer].

## 2016-10-04 LAB — GC/CHLAMYDIA PROBE AMP (~~LOC~~) NOT AT ARMC
CHLAMYDIA, DNA PROBE: NEGATIVE
NEISSERIA GONORRHEA: NEGATIVE

## 2016-10-17 ENCOUNTER — Ambulatory Visit (HOSPITAL_COMMUNITY)
Admission: RE | Admit: 2016-10-17 | Discharge: 2016-10-17 | Disposition: A | Discharge status: Home / Self Care | Payer: Medicaid Other | Source: Ambulatory Visit | Attending: Advanced Practice Midwife | Admitting: Advanced Practice Midwife

## 2016-10-17 ENCOUNTER — Telehealth: Payer: Self-pay | Admitting: Family Medicine

## 2016-10-17 DIAGNOSIS — N939 Abnormal uterine and vaginal bleeding, unspecified: Secondary | ICD-10-CM | POA: Diagnosis not present

## 2016-10-17 DIAGNOSIS — D259 Leiomyoma of uterus, unspecified: Secondary | ICD-10-CM | POA: Diagnosis not present

## 2016-10-17 NOTE — Telephone Encounter (Signed)
Called patient to discuss pelvic ultrasound results. This was obtained after MAU visit for AUB. She is feeling better currently. Ultrasound shows 6 cm fibroid in the posterior fundus. She has a follow-up appointment with her gynecologist on 10/22/16.  Advised patient on the fibroid. Recommended that she keep her follow-up appointment with gynecology to discuss this. Patient expresses understanding and has no questions.  Virginia Crews, MD, MPH PGY-3,  [facility] 10/17/2016 12:09 PM

## 2016-10-20 ENCOUNTER — Other Ambulatory Visit: Payer: Self-pay | Admitting: Family Medicine

## 2016-10-20 DIAGNOSIS — I10 Essential (primary) hypertension: Secondary | ICD-10-CM

## 2016-10-22 ENCOUNTER — Other Ambulatory Visit (HOSPITAL_COMMUNITY)
Admission: RE | Admit: 2016-10-22 | Discharge: 2016-10-22 | Disposition: A | Discharge status: Home / Self Care | Payer: Medicaid Other | Source: Ambulatory Visit | Attending: Obstetrics & Gynecology | Admitting: Obstetrics & Gynecology

## 2016-10-22 ENCOUNTER — Ambulatory Visit (INDEPENDENT_AMBULATORY_CARE_PROVIDER_SITE_OTHER): Payer: Medicaid Other | Admitting: Obstetrics & Gynecology

## 2016-10-22 ENCOUNTER — Encounter: Payer: Self-pay | Admitting: Obstetrics & Gynecology

## 2016-10-22 VITALS — Wt 279.8 lb

## 2016-10-22 DIAGNOSIS — Z01419 Encounter for gynecological examination (general) (routine) without abnormal findings: Secondary | ICD-10-CM | POA: Diagnosis present

## 2016-10-22 DIAGNOSIS — N939 Abnormal uterine and vaginal bleeding, unspecified: Secondary | ICD-10-CM | POA: Insufficient documentation

## 2016-10-22 DIAGNOSIS — Z1151 Encounter for screening for human papillomavirus (HPV): Secondary | ICD-10-CM | POA: Diagnosis not present

## 2016-10-22 MED ORDER — IBUPROFEN 800 MG PO TABS: 800.0000 mg | ORAL_TABLET | Freq: Three times a day (TID) | ORAL | 3 refills | Status: DC | PRN

## 2016-10-22 MED ORDER — MEGESTROL ACETATE 40 MG PO TABS: 80.0000 mg | ORAL_TABLET | Freq: Two times a day (BID) | ORAL | 5 refills | Status: DC

## 2016-10-22 NOTE — Progress Notes (Signed)
GYNECOLOGY OFFICE VISIT NOTE  History:  55 y.o. [id number] here today for evaluation of AUB for several months. Was seen in MAU for same complaint on 10/03/16, placed on Megace.  Reports improved bleeding profile on Megace, but restarted spotting yesterday. Has associated lower abdominal cramping.    Past Medical History:  Diagnosis Date  . Diabetes mellitus   . Environmental allergies   . Fibroid   . High cholesterol   . Hx of cardiovascular stress test    a. ETT-MV 2/14:  EF 46% (normal by echo), low risk, no ischemia or scar  . Hx of echocardiogram    Echo 2/14: mild LVH, EF 55-60%, Gr 1 diast dysfn, mild LAE  . Hypertension   . Hyperthyroidism    Radioactive iodine ablation    Past Surgical History:  Procedure Laterality Date  . [place], 2001   x 2    The following portions of the patient's history were reviewed and updated as appropriate: allergies, current medications, past family history, past medical history, past social history, past surgical history and problem list.   Health Maintenance:  Normal pap and negative HRHPV on 2015.  Normal mammogram on 08/2015  Review of Systems:  Pertinent items noted in HPI and remainder of comprehensive ROS otherwise negative.   Objective:  Physical Exam Wt 279 lb 12.8 oz (126.9 kg)   LMP 09/10/2016 (Exact Date)   BMI 43.82 kg/m  CONSTITUTIONAL: Well-developed, well-nourished female in no acute distress.  HENT:  Normocephalic, atraumatic. External right and left ear normal. Oropharynx is clear and moist EYES: Conjunctivae and EOM are normal. Pupils are equal, round, and reactive to light. No scleral icterus.  NECK: Normal range of motion, supple, no masses SKIN: Skin is warm and dry. No rash noted. Not diaphoretic. No erythema. No pallor. NEUROLOGIC: Alert and oriented to person, place, and time. Normal reflexes, muscle tone coordination. No cranial nerve deficit noted. PSYCHIATRIC: Normal mood and affect. Normal  behavior. Normal judgment and thought content. CARDIOVASCULAR: Normal heart rate noted RESPIRATORY: Effort and breath sounds normal, no problems with respiration noted ABDOMEN: Soft, no distention noted.   PELVIC: Normal appearing external genitalia; normal appearing vaginal mucosa and cervix. Scant red bloody discharge noted.  Pap done. MUSCULOSKELETAL: Normal range of motion. No edema noted.  ENDOMETRIAL BIOPSY     The indications for endometrial biopsy were reviewed.   Risks of the biopsy including cramping, bleeding, infection, uterine perforation, inadequate specimen and need for additional procedures  were discussed. The patient states she understands and agrees to undergo procedure today. Consent was signed. Time out was performed. Urine HCG was negative. During the pelvic exam, the cervix was prepped with Betadine. A single-toothed tenaculum was placed on the anterior lip of the cervix to stabilize it. The 3 mm pipelle was introduced into the endometrial cavity without difficulty to a depth of 10 cm, and a moderate amount of tissue was obtained and sent to pathology. The instruments were removed from the patient's vagina. Minimal bleeding from the cervix was noted. The patient tolerated the procedure well. Routine post-procedure instructions were given to the patient.     Labs and Imaging US Transvaginal Non-ob  Result Date: 10/17/2016 CLINICAL DATA:  Abnormal uterine bleeding, pelvic pain EXAM: TRANSABDOMINAL AND TRANSVAGINAL ULTRASOUND OF PELVIS TECHNIQUE: Both transabdominal and transvaginal ultrasound examinations of the pelvis were performed. Transabdominal technique was performed for global imaging of the pelvis including uterus, ovaries, adnexal regions, and pelvic cul-de-sac. It was necessary to  proceed with endovaginal exam following the transabdominal exam to visualize the endometrium. COMPARISON:  02/22/2014 FINDINGS: Uterus Measurements: 12.9 x 7.2 x 8.6 cm. At least 2 fibroids  noted, including a large posterior fundal fibroid measuring up to 6.1 cm. Posterior lower uterine body fibroid measures 2.7 cm. Endometrium Thickness: 7 mm in thickness, difficult to visualize due to fibroids. No focal abnormality visualized. Right ovary Measurements: If 10.0 x 2.3 x 1.9 cm. Normal appearance/no adnexal mass. Left ovary Measurements: 2.4 x 1.5 x 2.2 cm. Normal appearance/no adnexal mass. Other findings No abnormal free fluid. IMPRESSION: Enlarged fibroid uterus, with the largest fibroid measuring up to 6.1 cm in the posterior fundus. Electronically Signed   By: Rolm Baptise M.D.   On: 10/17/2016 10:07   US Pelvis Complete  Result Date: 10/17/2016 CLINICAL DATA:  Abnormal uterine bleeding, pelvic pain EXAM: TRANSABDOMINAL AND TRANSVAGINAL ULTRASOUND OF PELVIS TECHNIQUE: Both transabdominal and transvaginal ultrasound examinations of the pelvis were performed. Transabdominal technique was performed for global imaging of the pelvis including uterus, ovaries, adnexal regions, and pelvic cul-de-sac. It was necessary to proceed with endovaginal exam following the transabdominal exam to visualize the endometrium. COMPARISON:  02/22/2014 FINDINGS: Uterus Measurements: 12.9 x 7.2 x 8.6 cm. At least 2 fibroids noted, including a large posterior fundal fibroid measuring up to 6.1 cm. Posterior lower uterine body fibroid measures 2.7 cm. Endometrium Thickness: 7 mm in thickness, difficult to visualize due to fibroids. No focal abnormality visualized. Right ovary Measurements: If 10.0 x 2.3 x 1.9 cm. Normal appearance/no adnexal mass. Left ovary Measurements: 2.4 x 1.5 x 2.2 cm. Normal appearance/no adnexal mass. Other findings No abnormal free fluid. IMPRESSION: Enlarged fibroid uterus, with the largest fibroid measuring up to 6.1 cm in the posterior fundus. Electronically Signed   By: Rolm Baptise M.D.   On: 10/17/2016 10:07    Assessment & Plan:  1. Abnormal uterine bleeding (AUB) Patient is already  on Megace, will continue for now. Follow up results and manage accordingly.  Patient is not ideal surgical candidate given co-morbidities but will consider this if medical management fails.  - Cytology - PAP - Surgical pathology - megestrol (MEGACE) 40 MG tablet; Take 2 tablets (80 mg total) by mouth 2 (two) times daily. Can increase to 3 tablets twice a day for heavy bleeding  Dispense: 120 tablet; Refill: 5 - ibuprofen (ADVIL,MOTRIN) 800 MG tablet; Take 1 tablet (800 mg total) by mouth 3 (three) times daily with meals as needed for headache or moderate pain.  Dispense: 30 tablet; Refill: 3   Routine preventative health maintenance measures emphasized. Please refer to After Visit Summary for other counseling recommendations.   Return in about 2 weeks (around 11/05/2016) for Discuss results, further management.   Total face-to-face time with patient: 15 minutes. Over 50% of encounter was spent on counseling and coordination of care.   Verita Schneiders, MD, [place] Attending [place], [facility] for [employer], [place]

## 2016-10-22 NOTE — Patient Instructions (Signed)
Dysfunctional Uterine Bleeding Introduction Dysfunctional uterine bleeding is abnormal bleeding from the uterus. Dysfunctional uterine bleeding includes:  A period that comes earlier or later than usual.  A period that is lighter, heavier, or has blood clots.  Bleeding between periods.  Skipping one or more periods.  Bleeding after sexual intercourse.  Bleeding after menopause. Follow these instructions at home: Pay attention to any changes in your symptoms. Follow these instructions to help with your condition: Eating and drinking  Eat well-balanced meals. Include foods that are high in iron, such as liver, meat, shellfish, green leafy vegetables, and eggs.  If you become constipated:  Drink plenty of water.  Eat fruits and vegetables that are high in water and fiber, such as spinach, carrots, raspberries, apples, and mango. Medicines  Take over-the-counter and prescription medicines only as told by your health care provider.  Do not change medicines without talking with your health care provider.  Aspirin or medicines that contain aspirin may make the bleeding worse. Do not take those medicines:  During the week before your period.  During your period.  If you were prescribed iron pills, take them as told by your health care provider. Iron pills help to replace iron that your body loses because of this condition. Activity  If you need to change your sanitary pad or tampon more than one time every 2 hours:  Lie in bed with your feet raised (elevated).  Place a cold pack on your lower abdomen.  Rest as much as possible until the bleeding stops or slows down.  Do not try to lose weight until the bleeding has stopped and your blood iron level is back to normal. Other Instructions  For two months, write down:  When your period starts.  When your period ends.  When any abnormal bleeding occurs.  What problems you notice.  Keep all follow up visits as told by  your health care provider. This is important. Contact a health care provider if:  You get light-headed or weak.  You have nausea and vomiting.  You cannot eat or drink without vomiting.  You feel dizzy or have diarrhea while you are taking medicines.  You are taking birth control pills or hormones, and you want to change them or stop taking them. Get help right away if:  You develop a fever or chills.  You need to change your sanitary pad or tampon more than one time per hour.  Your bleeding becomes heavier, or your flow contains clots more often.  You develop pain in your abdomen.  You lose consciousness.  You develop a rash. This information is not intended to replace advice given to you by your health care provider. Make sure you discuss any questions you have with your health care provider. Document Released: 10/19/2000 Document Revised: 03/29/2016 Document Reviewed: 01/17/2015  2017 Elsevier

## 2016-10-24 LAB — CYTOLOGY - PAP
Diagnosis: NEGATIVE
HPV (WINDOPATH): NOT DETECTED

## 2016-11-02 ENCOUNTER — Inpatient Hospital Stay (HOSPITAL_COMMUNITY)
Admission: AD | Admit: 2016-11-02 | Discharge: 2016-11-02 | Disposition: A | Discharge status: Home / Self Care | Payer: Medicaid Other | Source: Ambulatory Visit | Attending: Obstetrics and Gynecology | Admitting: Obstetrics and Gynecology

## 2016-11-02 DIAGNOSIS — Z79899 Other long term (current) drug therapy: Secondary | ICD-10-CM | POA: Insufficient documentation

## 2016-11-02 DIAGNOSIS — N3001 Acute cystitis with hematuria: Secondary | ICD-10-CM | POA: Insufficient documentation

## 2016-11-02 DIAGNOSIS — E119 Type 2 diabetes mellitus without complications: Secondary | ICD-10-CM | POA: Diagnosis not present

## 2016-11-02 DIAGNOSIS — I10 Essential (primary) hypertension: Secondary | ICD-10-CM | POA: Diagnosis not present

## 2016-11-02 DIAGNOSIS — Z7982 Long term (current) use of aspirin: Secondary | ICD-10-CM | POA: Insufficient documentation

## 2016-11-02 DIAGNOSIS — Z8249 Family history of ischemic heart disease and other diseases of the circulatory system: Secondary | ICD-10-CM | POA: Diagnosis not present

## 2016-11-02 DIAGNOSIS — Z833 Family history of diabetes mellitus: Secondary | ICD-10-CM | POA: Diagnosis not present

## 2016-11-02 DIAGNOSIS — E7800 Pure hypercholesterolemia, unspecified: Secondary | ICD-10-CM | POA: Diagnosis not present

## 2016-11-02 DIAGNOSIS — Z7984 Long term (current) use of oral hypoglycemic drugs: Secondary | ICD-10-CM | POA: Insufficient documentation

## 2016-11-02 DIAGNOSIS — R300 Dysuria: Secondary | ICD-10-CM | POA: Diagnosis present

## 2016-11-02 LAB — CBC
HCT: 33.9 % — ABNORMAL LOW (ref 36.0–46.0)
HEMOGLOBIN: 11.1 g/dL — AB (ref 12.0–15.0)
MCH: 25.1 pg — AB (ref 26.0–34.0)
MCHC: 32.7 g/dL (ref 30.0–36.0)
MCV: 76.5 fL — AB (ref 78.0–100.0)
PLATELETS: 441 10*3/uL — AB (ref 150–400)
RBC: 4.43 MIL/uL (ref 3.87–5.11)
RDW: 14.9 % (ref 11.5–15.5)
WBC: 8.1 10*3/uL (ref 4.0–10.5)

## 2016-11-02 LAB — COMPREHENSIVE METABOLIC PANEL
ALT: 19 U/L (ref 14–54)
ANION GAP: 8 (ref 5–15)
AST: 13 U/L — ABNORMAL LOW (ref 15–41)
Albumin: 3.5 g/dL (ref 3.5–5.0)
Alkaline Phosphatase: 70 U/L (ref 38–126)
BUN: 16 mg/dL (ref 6–20)
CHLORIDE: 106 mmol/L (ref 101–111)
CO2: 23 mmol/L (ref 22–32)
CREATININE: 1.01 mg/dL — AB (ref 0.44–1.00)
Calcium: 10.6 mg/dL — ABNORMAL HIGH (ref 8.9–10.3)
Glucose, Bld: 216 mg/dL — ABNORMAL HIGH (ref 65–99)
Potassium: 3.7 mmol/L (ref 3.5–5.1)
SODIUM: 137 mmol/L (ref 135–145)
Total Bilirubin: 0.4 mg/dL (ref 0.3–1.2)
Total Protein: 8.4 g/dL — ABNORMAL HIGH (ref 6.5–8.1)

## 2016-11-02 LAB — URINALYSIS, ROUTINE W REFLEX MICROSCOPIC
BACTERIA UA: NONE SEEN
Bilirubin Urine: NEGATIVE
Glucose, UA: >=500 mg/dL — AB
Ketones, ur: NEGATIVE mg/dL
LEUKOCYTES UA: NEGATIVE
Nitrite: NEGATIVE
PH: 5 (ref 5.0–8.0)
Protein, ur: 30 mg/dL — AB
SPECIFIC GRAVITY, URINE: 1.02 (ref 1.005–1.030)

## 2016-11-02 LAB — WET PREP, GENITAL
CLUE CELLS WET PREP: NONE SEEN
SPERM: NONE SEEN
TRICH WET PREP: NONE SEEN
YEAST WET PREP: NONE SEEN

## 2016-11-02 MED ORDER — NITROFURANTOIN MONOHYD MACRO 100 MG PO CAPS: 100.0000 mg | ORAL_CAPSULE | Freq: Two times a day (BID) | ORAL | 0 refills | Status: DC

## 2016-11-02 NOTE — Discharge Instructions (Signed)
Dysuria Introduction Dysuria is pain or discomfort while urinating. The pain or discomfort may be felt in the tube that carries urine out of the bladder (urethra) or in the surrounding tissue of the genitals. The pain may also be felt in the groin area, lower abdomen, and lower back. You may have to urinate frequently or have the sudden feeling that you have to urinate (urgency). Dysuria can affect both men and women, but is more common in women. Dysuria can be caused by many different things, including:  Urinary tract infection in women.  Infection of the kidney or bladder.  Kidney stones or bladder stones.  Certain sexually transmitted infections (STIs), such as chlamydia.  Dehydration.  Inflammation of the vagina.  Use of certain medicines.  Use of certain soaps or scented products that cause irritation. Follow these instructions at home: Watch your dysuria for any changes. The following actions may help to reduce any discomfort you are feeling:  Drink enough fluid to keep your urine clear or pale yellow.  Empty your bladder often. Avoid holding urine for long periods of time.  After a bowel movement or urination, women should cleanse from front to back, using each tissue only once.  Empty your bladder after sexual intercourse.  Take medicines only as directed by your health care provider.  If you were prescribed an antibiotic medicine, finish it all even if you start to feel better.  Avoid caffeine, tea, and alcohol. They can irritate the bladder and make dysuria worse. In men, alcohol may irritate the prostate.  Keep all follow-up visits as directed by your health care provider. This is important.  If you had any tests done to find the cause of dysuria, it is your responsibility to obtain your test results. Ask the lab or department performing the test when and how you will get your results. Talk with your health care provider if you have any questions about your  results. Contact a health care provider if:  You develop pain in your back or sides.  You have a fever.  You have nausea or vomiting.  You have blood in your urine.  You are not urinating as often as you usually do. Get help right away if:  You pain is severe and not relieved with medicines.  You are unable to hold down any fluids.  You or someone else notices a change in your mental function.  You have a rapid heartbeat at rest.  You have shaking or chills.  You feel extremely weak. This information is not intended to replace advice given to you by your health care provider. Make sure you discuss any questions you have with your health care provider. Document Released: 07/20/2004 Document Revised: 03/29/2016 Document Reviewed: 06/17/2014  2017 Elsevier    Urinary Tract Infection, Adult Introduction A urinary tract infection (UTI) is an infection of any part of the urinary tract. The urinary tract includes the:  Kidneys.  Ureters.  Bladder.  Urethra. These organs make, store, and get rid of pee (urine) in the body. Follow these instructions at home:  Take over-the-counter and prescription medicines only as told by your doctor.  If you were prescribed an antibiotic medicine, take it as told by your doctor. Do not stop taking the antibiotic even if you start to feel better.  Avoid the following drinks:  Alcohol.  Caffeine.  Tea.  Carbonated drinks.  Drink enough fluid to keep your pee clear or pale yellow.  Keep all follow-up visits as told  by your doctor. This is important.  Make sure to:  Empty your bladder often and completely. Do not to hold pee for long periods of time.  Empty your bladder before and after sex.  Wipe from front to back after a bowel movement if you are female. Use each tissue one time when you wipe. Contact a doctor if:  You have back pain.  You have a fever.  You feel sick to your stomach (nauseous).  You throw up  (vomit).  Your symptoms do not get better after 3 days.  Your symptoms go away and then come back. Get help right away if:  You have very bad back pain.  You have very bad lower belly (abdominal) pain.  You are throwing up and cannot keep down any medicines or water. This information is not intended to replace advice given to you by your health care provider. Make sure you discuss any questions you have with your health care provider. Document Released: 04/09/2008 Document Revised: 03/29/2016 Document Reviewed: 09/12/2015  2017 Elsevier

## 2016-11-02 NOTE — MAU Provider Note (Signed)
History     CSN: [id number]  Arrival date and time: 11/02/16 1057   None     Chief Complaint  Patient presents with  . Dysuria  . Urinary Frequency   HPI PHENICIA BENNETTE is a 55 y.o. female who presents for dysuria & urinary frequency. Symptoms began yesterday. Denies n/v, flank pain, hematuria, or fever/chills.  PMH significant for hypertension & DM. Pt on multiple antihypertensives which she reports taking as prescribed. Denies headache, chest pain, or SOB.  Goes to [facility] for routine care.   Past Medical History:  Diagnosis Date  . Diabetes mellitus   . Environmental allergies   . Fibroid   . High cholesterol   . Hx of cardiovascular stress test    a. ETT-MV 2/14:  EF 46% (normal by echo), low risk, no ischemia or scar  . Hx of echocardiogram    Echo 2/14: mild LVH, EF 55-60%, Gr 1 diast dysfn, mild LAE  . Hypertension   . Hyperthyroidism    Radioactive iodine ablation    Past Surgical History:  Procedure Laterality Date  . CESAREAN SECTION  1983, 2001   x 2    Family History  Problem Relation Age of Onset  . Pneumonia Mother   . Diabetes Mother   . Hypertension Father   . Hypertension Sister   . Hyperlipidemia Sister   . Hypertension Brother   . Hypertension Maternal Aunt   . Hypertension Paternal Aunt   . Cancer Paternal Aunt     throat cancer  . Diabetes Maternal Grandmother   . Diabetes Paternal Grandmother   . Cancer Paternal Grandmother     pancreatic cancer    Social History  Substance Use Topics  . Smoking status: Never Smoker  . Smokeless tobacco: Never Used  . Alcohol use No     Comment: occasional for special occasions    Allergies: No Known Allergies  Prescriptions Prior to Admission  Medication Sig Dispense Refill Last Dose  . amLODipine (NORVASC) 10 MG tablet Take 1 tablet (10 mg total) by mouth daily. 90 tablet 3 11/02/2016 at Unknown time  . aspirin EC 81 MG tablet Take 81 mg by mouth daily.   11/02/2016 at Unknown time  .  cloNIDine (CATAPRES) 0.3 MG tablet TAKE 1 TABLET BY MOUTH TWICE A DAY 60 tablet 0 11/02/2016 at Unknown time  . cromolyn (OPTICROM) 4 % ophthalmic solution Place 1 drop into both eyes 2 (two) times daily as needed. EYE ALLERGIES   11/01/2016 at Unknown time  . diclofenac sodium (VOLTAREN) 1 % GEL Apply 2 g topically 4 (four) times daily. 100 g 2 11/02/2016 at Unknown time  . glipiZIDE (GLUCOTROL) 10 MG tablet Take 1 tablet (10 mg total) by mouth 2 (two) times daily before a meal. 180 tablet 2 11/02/2016 at Unknown time  . hydrochlorothiazide (HYDRODIURIL) 25 MG tablet TAKE 1 TABLET (25 MG TOTAL) BY MOUTH DAILY. 90 tablet 2 11/02/2016 at Unknown time  . ibuprofen (ADVIL,MOTRIN) 800 MG tablet Take 1 tablet (800 mg total) by mouth 3 (three) times daily with meals as needed for headache or moderate pain. 30 tablet 3 Past Week at Unknown time  . lisinopril (PRINIVIL,ZESTRIL) 10 MG tablet Take 1 tablet (10 mg total) by mouth daily. Will need clinic appointment for future refills. 30 tablet 1 11/02/2016 at Unknown time  . megestrol (MEGACE) 40 MG tablet Take 2 tablets (80 mg total) by mouth 2 (two) times daily. Can increase to 3 tablets twice a day  for heavy bleeding 120 tablet 5 Past Week at Unknown time  . metFORMIN (GLUCOPHAGE) 1000 MG tablet Take 1 tablet (1,000 mg total) by mouth 2 (two) times daily with a meal. 180 tablet 3 11/02/2016 at Unknown time  . metoprolol succinate (TOPROL XL) 50 MG 24 hr tablet Take 1 tablet (50 mg total) by mouth daily. Take with or immediately following a meal. 30 tablet 6 11/02/2016 at Unknown time    Review of Systems  Constitutional: Negative for chills and fever.  Respiratory: Negative for shortness of breath.   Cardiovascular: Negative for chest pain.  Gastrointestinal: Negative.   Genitourinary: Positive for dysuria and frequency. Negative for flank pain, hematuria and urgency.  Neurological: Negative for headaches.   Physical Exam   Blood pressure 159/86,  pulse 65, temperature 98.3 F (36.8 C), resp. rate (!) 65, height 5\' 5"  (1.651 m), weight 277 lb (125.6 kg), last menstrual period 09/10/2016.  Physical Exam  Nursing note and vitals reviewed. Constitutional: She is oriented to person, place, and time. She appears well-developed and well-nourished. No distress.  HENT:  Head: Normocephalic and atraumatic.  Eyes: Conjunctivae are normal. Right eye exhibits no discharge. Left eye exhibits no discharge. No scleral icterus.  Neck: Normal range of motion.  Cardiovascular: Normal rate, regular rhythm and normal heart sounds.   No murmur heard. Respiratory: Effort normal and breath sounds normal. No respiratory distress. She has no wheezes.  GI: Soft. There is no tenderness. There is no CVA tenderness.  Neurological: She is alert and oriented to person, place, and time.  Skin: Skin is warm and dry. She is not diaphoretic.  Psychiatric: She has a normal mood and affect. Her behavior is normal. Judgment and thought content normal.    MAU Course  Procedures Results for orders placed or performed during the hospital encounter of 11/02/16 (from the past 24 hour(s))  Urinalysis, Routine w reflex microscopic     Status: Abnormal   Collection Time: 11/02/16 11:50 AM  Result Value Ref Range   Color, Urine YELLOW YELLOW   APPearance CLEAR CLEAR   Specific Gravity, Urine 1.020 1.005 - 1.030   pH 5.0 5.0 - 8.0   Glucose, UA >=500 (A) NEGATIVE mg/dL   Hgb urine dipstick MODERATE (A) NEGATIVE   Bilirubin Urine NEGATIVE NEGATIVE   Ketones, ur NEGATIVE NEGATIVE mg/dL   Protein, ur 30 (A) NEGATIVE mg/dL   Nitrite NEGATIVE NEGATIVE   Leukocytes, UA NEGATIVE NEGATIVE   RBC / HPF 0-5 0 - 5 RBC/hpf   WBC, UA 0-5 0 - 5 WBC/hpf   Bacteria, UA NONE SEEN NONE SEEN   Squamous Epithelial / LPF 0-5 (A) NONE SEEN   Mucous PRESENT   Comprehensive metabolic panel     Status: Abnormal   Collection Time: 11/02/16  1:23 PM  Result Value Ref Range   Sodium 137 135  - 145 mmol/L   Potassium 3.7 3.5 - 5.1 mmol/L   Chloride 106 101 - 111 mmol/L   CO2 23 22 - 32 mmol/L   Glucose, Bld 216 (H) 65 - 99 mg/dL   BUN 16 6 - 20 mg/dL   Creatinine, Ser 1.01 (H) 0.44 - 1.00 mg/dL   Calcium 10.6 (H) 8.9 - 10.3 mg/dL   Total Protein 8.4 (H) 6.5 - 8.1 g/dL   Albumin 3.5 3.5 - 5.0 g/dL   AST 13 (L) 15 - 41 U/L   ALT 19 14 - 54 U/L   Alkaline Phosphatase 70 38 - 126 U/L  Total Bilirubin 0.4 0.3 - 1.2 mg/dL   GFR calc non Af Amer >60 >60 mL/min   GFR calc Af Amer >60 >60 mL/min   Anion gap 8 5 - 15  CBC     Status: Abnormal   Collection Time: 11/02/16  1:23 PM  Result Value Ref Range   WBC 8.1 4.0 - 10.5 K/uL   RBC 4.43 3.87 - 5.11 MIL/uL   Hemoglobin 11.1 (L) 12.0 - 15.0 g/dL   HCT 33.9 (L) 36.0 - 46.0 %   MCV 76.5 (L) 78.0 - 100.0 fL   MCH 25.1 (L) 26.0 - 34.0 pg   MCHC 32.7 30.0 - 36.0 g/dL   RDW 14.9 11.5 - 15.5 %   Platelets 441 (H) 150 - 400 K/uL  Wet prep, genital     Status: Abnormal   Collection Time: 11/02/16  2:15 PM  Result Value Ref Range   Yeast Wet Prep HPF POC NONE SEEN NONE SEEN   Trich, Wet Prep NONE SEEN NONE SEEN   Clue Cells Wet Prep HPF POC NONE SEEN NONE SEEN   WBC, Wet Prep HPF POC MANY (A) NONE SEEN   Sperm NONE SEEN     MDM Initial BP 180s/90s. Pt states this is her norm at home. Took BP meds this morning & gets routine care. Denies headache, chest pain, or SOB.  Urine culture pending. Additional labs ordered by other provider while pt waiting in lobby; elevated creatinine is consistent with previous evaluations.   Assessment and Plan  A: 1. Acute cystitis with hematuria    P; Discharge home Rx macrobid Urine culture pending Stressed improtance of taking meds as prescribed & keeping appt with PCP Go to ED for s/s of worsening HTN, stroke, or MI Discussed reasons to seek further treatment for worsening urinary symptoms  Jorje Guild 11/02/2016, 4:27 PM

## 2016-11-02 NOTE — MAU Note (Signed)
Pt presents to MAU with complaints of vaginal burning with difficulty urinating that started yesterday. Denies any vaginal bleeding or abnormal discharge

## 2016-11-03 LAB — URINE CULTURE: Culture: NO GROWTH

## 2016-11-03 LAB — HIV ANTIBODY (ROUTINE TESTING W REFLEX): HIV SCREEN 4TH GENERATION: NONREACTIVE

## 2016-11-06 LAB — GC/CHLAMYDIA PROBE AMP (~~LOC~~) NOT AT ARMC
Chlamydia: NEGATIVE
NEISSERIA GONORRHEA: NEGATIVE

## 2016-11-16 ENCOUNTER — Ambulatory Visit
Admission: RE | Admit: 2016-11-16 | Discharge: 2016-11-16 | Disposition: A | Discharge status: Home / Self Care | Payer: Medicaid Other | Source: Ambulatory Visit | Attending: Family Medicine | Admitting: Family Medicine

## 2016-11-16 ENCOUNTER — Encounter: Payer: Self-pay | Admitting: Internal Medicine

## 2016-11-16 ENCOUNTER — Ambulatory Visit (INDEPENDENT_AMBULATORY_CARE_PROVIDER_SITE_OTHER): Payer: Medicaid Other | Admitting: Internal Medicine

## 2016-11-16 VITALS — BP 152/82 | HR 66 | Temp 98.0°F | Wt 276.0 lb

## 2016-11-16 DIAGNOSIS — R053 Chronic cough: Secondary | ICD-10-CM

## 2016-11-16 DIAGNOSIS — R05 Cough: Secondary | ICD-10-CM

## 2016-11-16 MED ORDER — LOSARTAN POTASSIUM 25 MG PO TABS: 25.0000 mg | ORAL_TABLET | Freq: Every day | ORAL | 2 refills | Status: DC

## 2016-11-16 MED ORDER — BENZONATATE 100 MG PO CAPS: 100.0000 mg | ORAL_CAPSULE | Freq: Two times a day (BID) | ORAL | 0 refills | Status: DC | PRN

## 2016-11-16 NOTE — Progress Notes (Signed)
   Subjective:   Patient: Andrea [name]       Birthdate: [birth year]       MRN: [id number]      HPI  Andrea [name] is a 56 y.o. female presenting for same day visit for cough.   Cough Reports cough with congestion congestion intermittently for the past three months. The prior two times the patient developed these symptoms, they resolved with OTC medications, including Robitussin and Nyquil. This time her symptoms have not been relieved by these meds. Patient also reports what she calls "wheezing," however she describes this as more of a crackling sound when she coughs. This sound is more prominent when she is laying down but does not happen every time she coughs. Denies any difficulty breathing. Denies rhinorrhea or sore throat. Cough is occasionally productive although she is unsure of the color of the sputum. Denies sick contacts but she does take care of four children whoa re in school. No flu shot this year. Denies fevers, chills. Patient is a never smoker though is exposed to smoke as her partner is a smoker. No history of asthma. Remote history of seasonal allergies. Does not use any inhalers.   Smoking status reviewed. Patient is never smoker. Positive passive smoke exposure.   Review of Systems See HPI.     Objective:  Physical Exam  Constitutional: She is oriented to person, place, and time.  Pleasant obese female in NAD  HENT:  Head: Normocephalic and atraumatic.  Eyes: Conjunctivae and EOM are normal. Right eye exhibits no discharge. Left eye exhibits no discharge.  Cardiovascular: Normal rate, regular rhythm and normal heart sounds.   No murmur heard. Pulmonary/Chest:  CTAB. No wheezes, rales, rhonchi. Normal WOB on RA. Speaking in full sentences with no difficulty. Did not cough at all during encounter.   Neurological: She is alert and oriented to person, place, and time.  Psychiatric: Affect and judgment normal.      Assessment & Plan:  Persistent cough As symptoms  are persistent and worsening over the past three months, concern for etiology other than viral. Although patient says she is wheezing, description of wheeze is more consistent with sound of sputum production when coughing, so doubt asthma as etiology. Lungs CTAB with normal WOB on RA and no coughing throughout encounter. Patient is never smoker, but is exposed to secondhand smoke. Patient's med list initially included lisinopril, however patient reports this was discontinued and she is now on losartan instead (med list updated to reflect this). Will obtain CXR to rule out concerning underlying etiology.  - CXR today at [place] [facility] TID PRN cough - Mucinex - Continue losartan - Not given inhaler as patient not wheezing on exam and description of symptoms not consistent with wheezing    Adin Hector, MD, MPH PGY-2 [facility] Pager [phone]

## 2016-11-16 NOTE — Patient Instructions (Addendum)
It was nice meeting you today Andrea [name]!  For your cough, please go to [place] today at [facility] on [address] in [place] to have your chest xray performed. We will let you know if there are any abnormalities.   You can also take one Tessalon perle up to every 8 hours as needed for cough. Eating a spoonful of honey, either alone or mixed into a warm beverage, 3-4 times a day can also help with cough. Using a humidifier or standing in a bathroom with a warm shower running can also help with cough and congestion.   For your congestion, you can take Mucinex.   Since you already have losartan at home, you do not need to pick this up from the pharmacy.   If you have any questions or concerns, please feel free to call the clinic.   Be well,  Dr. Avon Gully

## 2016-11-16 NOTE — Assessment & Plan Note (Signed)
As symptoms are persistent and worsening over the past three months, concern for etiology other than viral. Although patient says she is wheezing, description of wheeze is more consistent with sound of sputum production when coughing, so doubt asthma as etiology. Lungs CTAB with normal WOB on RA and no coughing throughout encounter. Patient is never smoker, but is exposed to secondhand smoke. Patient's med list initially included lisinopril, however patient reports this was discontinued and she is now on losartan instead (med list updated to reflect this). Will obtain CXR to rule out concerning underlying etiology.  - CXR today at [place] [facility] TID PRN cough - Mucinex - Continue losartan - Not given inhaler as patient not wheezing on exam and description of symptoms not consistent with wheezing

## 2016-11-26 ENCOUNTER — Encounter: Payer: Self-pay | Admitting: Obstetrics & Gynecology

## 2016-11-26 ENCOUNTER — Ambulatory Visit (INDEPENDENT_AMBULATORY_CARE_PROVIDER_SITE_OTHER): Payer: Medicaid Other | Admitting: Obstetrics & Gynecology

## 2016-11-26 VITALS — BP 167/96 | HR 89 | Wt 273.8 lb

## 2016-11-26 DIAGNOSIS — N939 Abnormal uterine and vaginal bleeding, unspecified: Secondary | ICD-10-CM | POA: Diagnosis not present

## 2016-11-26 DIAGNOSIS — Z1231 Encounter for screening mammogram for malignant neoplasm of breast: Secondary | ICD-10-CM

## 2016-11-26 NOTE — Progress Notes (Signed)
GYNECOLOGY OFFICE VISIT NOTE  History:  56 y.o. [id number] here today for follow up for AUB.  Was placed on Megace, this has helped with bleeding. Occasional pain alleviated by Ibuprofen.  Had benign pap and endometrial biopsy.  She denies any abnormal vaginal discharge, bleeding, pelvic pain or other concerns.   Past Medical History:  Diagnosis Date  . Diabetes mellitus   . Environmental allergies   . Fibroid   . High cholesterol   . Hx of cardiovascular stress test    a. ETT-MV 2/14:  EF 46% (normal by echo), low risk, no ischemia or scar  . Hx of echocardiogram    Echo 2/14: mild LVH, EF 55-60%, Gr 1 diast dysfn, mild LAE  . Hypertension   . Hyperthyroidism    Radioactive iodine ablation    Past Surgical History:  Procedure Laterality Date  . [place], 2001   x 2    The following portions of the patient's history were reviewed and updated as appropriate: allergies, current medications, past family history, past medical history, past social history, past surgical history and problem list.   Health Maintenance:  Normal pap and negative HRHPV on 10/22/2016.  Normal mammogram on 08/12/2015.  Review of Systems:  Pertinent items noted in HPI and remainder of comprehensive ROS otherwise negative.   Objective:  Physical Exam BP (!) 167/96   Pulse 89   Wt 273 lb 12.8 oz (124.2 kg)   BMI 45.56 kg/m  CONSTITUTIONAL: Well-developed, well-nourished female in no acute distress.  HENT:  Normocephalic, atraumatic. External right and left ear normal. Oropharynx is clear and moist EYES: Conjunctivae and EOM are normal. Pupils are equal, round, and reactive to light. No scleral icterus.  NECK: Normal range of motion, supple, no masses SKIN: Skin is warm and dry. No rash noted. Not diaphoretic. No erythema. No pallor. NEUROLOGIC: Alert and oriented to person, place, and time. Normal reflexes, muscle tone coordination. No cranial nerve deficit noted. PSYCHIATRIC: Normal mood  and affect. Normal behavior. Normal judgment and thought content. CARDIOVASCULAR: Normal heart rate noted RESPIRATORY: Effort and breath sounds normal, no problems with respiration noted ABDOMEN: Soft, no distention noted.   PELVIC: Deferred MUSCULOSKELETAL: Normal range of motion. No edema noted.  Labs and Imaging Dg Chest 2 View  Result Date: 11/16/2016 CLINICAL DATA:  Cough.  Chest pain . EXAM: CHEST  2 VIEW COMPARISON:  03/19/2016. FINDINGS: Mediastinum hilar structures normal. Cardiomegaly with normal pulmonary vascularity. Low lung volumes. No pleural effusion or pneumothorax. No acute bony abnormality. IMPRESSION: 1. Mild cardiomegaly. No evidence of overt congestive heart failure. 2. Low lung volumes. Electronically Signed   By: Marcello Moores  Register   On: 11/16/2016 16:08    10/17/2016 TRANSABDOMINAL AND TRANSVAGINAL ULTRASOUND OF PELVIS CLINICAL DATA:  Abnormal uterine bleeding, pelvic pain  TECHNIQUE: Both transabdominal and transvaginal ultrasound examinations of the pelvis were performed. Transabdominal technique was performed for global imaging of the pelvis including uterus, ovaries, adnexal regions, and pelvic cul-de-sac. It was necessary to proceed with endovaginal exam following the transabdominal exam to visualize the endometrium.  COMPARISON:  02/22/2014  FINDINGS: Uterus  Measurements: 12.9 x 7.2 x 8.6 cm. At least 2 fibroids noted, including a large posterior fundal fibroid measuring up to 6.1 cm. Posterior lower uterine body fibroid measures 2.7 cm.  Endometrium  Thickness: 7 mm in thickness, difficult to visualize due to fibroids. No focal abnormality visualized.  Right ovary  Measurements: If 10.0 x 2.3 x 1.9 cm. Normal appearance/no adnexal  mass.  Left ovary  Measurements: 2.4 x 1.5 x 2.2 cm. Normal appearance/no adnexal mass.  Other findings  No abnormal free fluid.  IMPRESSION: Enlarged fibroid uterus, with the largest fibroid  measuring up to 6.1 cm in the posterior fundus.   Electronically Signed   By: Rolm Baptise M.D.   On: 10/17/2016 10:07    Assessment & Plan:  1. Abnormal uterine bleeding (AUB) Pelvic ultrasound results reviewed. Also reviewed pathology results. Continue Megace for now. Bleeding precautions reviewed. Routine preventative health maintenance measures emphasized; mammogram scheduled. Please refer to After Visit Summary for other counseling recommendations.  Return if symptoms worsen or fail to improve.   Total face-to-face time with patient: 15 minutes. Over 50% of encounter was spent on counseling and coordination of care.   Verita Schneiders, MD, [place] Attending [place], [facility] for [employer], [place]

## 2016-11-26 NOTE — Progress Notes (Signed)
BP elevated today . States hasn't taken meds yet today because can't find her med bag because packing to move.

## 2016-11-26 NOTE — Patient Instructions (Signed)
Return to clinic for any scheduled appointments or for any gynecologic concerns as needed.   Levonorgestrel intrauterine device (IUD) What is this medicine? LEVONORGESTREL IUD (LEE voe nor jes trel) is a contraceptive (birth control) device. The device is placed inside the uterus by a healthcare professional. It is used to prevent pregnancy. This device can also be used to treat heavy bleeding that occurs during your period. This medicine may be used for other purposes; ask your health care provider or pharmacist if you have questions. COMMON BRAND NAME(S): Minette Headland What should I tell my health care provider before I take this medicine? They need to know if you have any of these conditions: -abnormal Pap smear -cancer of the breast, uterus, or cervix -diabetes -endometritis -genital or pelvic infection now or in the past -have more than one sexual partner or your partner has more than one partner -heart disease -history of an ectopic or tubal pregnancy -immune system problems -IUD in place -liver disease or tumor -problems with blood clots or take blood-thinners -seizures -use intravenous drugs -uterus of unusual shape -vaginal bleeding that has not been explained -an unusual or allergic reaction to levonorgestrel, other hormones, silicone, or polyethylene, medicines, foods, dyes, or preservatives -pregnant or trying to get pregnant -breast-feeding How should I use this medicine? This device is placed inside the uterus by a health care professional. Talk to your pediatrician regarding the use of this medicine in children. Special care may be needed. Overdosage: If you think you have taken too much of this medicine contact a poison control center or emergency room at once. NOTE: This medicine is only for you. Do not share this medicine with others. What if I miss a dose? This does not apply. Depending on the brand of device you have inserted, the device will  need to be replaced every 3 to 5 years if you wish to continue using this type of birth control. What may interact with this medicine? Do not take this medicine with any of the following medications: -amprenavir -bosentan -fosamprenavir This medicine may also interact with the following medications: -aprepitant -barbiturate medicines for inducing sleep or treating seizures -bexarotene -griseofulvin -medicines to treat seizures like carbamazepine, ethotoin, felbamate, oxcarbazepine, phenytoin, topiramate -modafinil -pioglitazone -rifabutin -rifampin -rifapentine -some medicines to treat HIV infection like atazanavir, indinavir, lopinavir, nelfinavir, tipranavir, ritonavir -St. John's wort -warfarin This list may not describe all possible interactions. Give your health care provider a list of all the medicines, herbs, non-prescription drugs, or dietary supplements you use. Also tell them if you smoke, drink alcohol, or use illegal drugs. Some items may interact with your medicine. What should I watch for while using this medicine? Visit your doctor or health care professional for regular check ups. See your doctor if you or your partner has sexual contact with others, becomes HIV positive, or gets a sexual transmitted disease. This product does not protect you against HIV infection (AIDS) or other sexually transmitted diseases. You can check the placement of the IUD yourself by reaching up to the top of your vagina with clean fingers to feel the threads. Do not pull on the threads. It is a good habit to check placement after each menstrual period. Call your doctor right away if you feel more of the IUD than just the threads or if you cannot feel the threads at all. The IUD may come out by itself. You may become pregnant if the device comes out. If you notice that the IUD has  come out use a backup birth control method like condoms and call your health care provider. Using tampons will not  change the position of the IUD and are okay to use during your period. This IUD can be safely scanned with magnetic resonance imaging (MRI) only under specific conditions. Before you have an MRI, tell your healthcare provider that you have an IUD in place, and which type of IUD you have in place. What side effects may I notice from receiving this medicine? Side effects that you should report to your doctor or health care professional as soon as possible: -allergic reactions like skin rash, itching or hives, swelling of the face, lips, or tongue -fever, flu-like symptoms -genital sores -high blood pressure -no menstrual period for 6 weeks during use -pain, swelling, warmth in the leg -pelvic pain or tenderness -severe or sudden headache -signs of pregnancy -stomach cramping -sudden shortness of breath -trouble with balance, talking, or walking -unusual vaginal bleeding, discharge -yellowing of the eyes or skin Side effects that usually do not require medical attention (report to your doctor or health care professional if they continue or are bothersome): -acne -breast pain -change in sex drive or performance -changes in weight -cramping, dizziness, or faintness while the device is being inserted -headache -irregular menstrual bleeding within first 3 to 6 months of use -nausea This list may not describe all possible side effects. Call your doctor for medical advice about side effects. You may report side effects to FDA at 1-800-FDA-1088. Where should I keep my medicine? This does not apply. NOTE: This sheet is a summary. It may not cover all possible information. If you have questions about this medicine, talk to your doctor, pharmacist, or health care provider.  2017 Elsevier/Gold Standard (2016-04-13 13:46:37)  Intrauterine Device Insertion Most often, an intrauterine device (IUD) is inserted into the uterus to prevent pregnancy. There are 2 types of IUDs available:  Copper  IUD-This type of IUD creates an environment that is not favorable to sperm survival. The mechanism of action of the copper IUD is not known for certain. It can stay in place for 10 years.  Hormone IUD-This type of IUD contains the hormone progestin (synthetic progesterone). The progestin thickens the cervical mucus and prevents sperm from entering the uterus, and it also thins the uterine lining. There is no evidence that the hormone IUD prevents implantation. One hormone IUD can stay in place for up to 5 years, and a different hormone IUD can stay in place for up to 3 years. An IUD is the most cost-effective birth control if left in place for the full duration. It may be removed at any time. LET [facility] CARE PROVIDER KNOW ABOUT:  Any allergies you have.  All medicines you are taking, including vitamins, herbs, eye drops, creams, and over-the-counter medicines.  Previous problems you or members of your family have had with the use of anesthetics.  Any blood disorders you have.  Previous surgeries you have had.  Possibility of pregnancy.  Medical conditions you have. RISKS AND COMPLICATIONS  Generally, intrauterine device insertion is a safe procedure. However, as with any procedure, complications can occur. Possible complications include:  Accidental puncture (perforation) of the uterus.  Accidental placement of the IUD either in the muscle layer of the uterus (myometrium) or outside the uterus. If this happens, the IUD can be found essentially floating around the bowels and must be taken out surgically.  The IUD may fall out of the uterus (expulsion). This is  more common in women who have recently had a child.   Pregnancy in the fallopian tube (ectopic).  Pelvic inflammatory disease (PID), which is infection of the uterus and fallopian tubes. The risk of PID is slightly increased in the first 20 days after the IUD is placed, but the overall risk is still very low. BEFORE THE  PROCEDURE  Schedule the IUD insertion for when you will have your menstrual period or right after, to make sure you are not pregnant. Placement of the IUD is better tolerated shortly after a menstrual cycle.  You may need to take tests or be examined to make sure you are not pregnant.  You may be required to take a pregnancy test.  You may be required to get checked for sexually transmitted infections (STIs) prior to placement. Placing an IUD in someone who has an infection can make the infection worse.  You may be given a pain reliever to take 1 or 2 hours before the procedure.  An exam will be performed to determine the size and position of your uterus.  Ask your health care provider about changing or stopping your regular medicines. PROCEDURE   A tool (speculum) is placed in the vagina. This allows your health care provider to see the lower part of the uterus (cervix).  The cervix is prepped with a medicine that lowers the risk of infection.  You may be given a medicine to numb each side of the cervix (intracervical or paracervical block). This is used to block and control any discomfort with insertion.  A tool (uterine sound) is inserted into the uterus to determine the length of the uterine cavity and the direction the uterus may be tilted.  A slim instrument (IUD inserter) is inserted through the cervical canal and into your uterus.  The IUD is placed in the uterine cavity and the insertion device is removed.  The nylon string that is attached to the IUD and used for eventual IUD removal is trimmed. It is trimmed so that it lays high in the vagina, just outside the cervix. AFTER THE PROCEDURE  You may have bleeding after the procedure. This is normal. It varies from light spotting for a few days to menstrual-like bleeding.  You may have mild cramping. This information is not intended to replace advice given to you by your health care provider. Make sure you discuss any  questions you have with your health care provider. Document Released: 06/20/2011 Document Revised: 08/12/2013 Document Reviewed: 04/12/2013 Elsevier Interactive Patient Education  2017 [employer].

## 2016-11-27 ENCOUNTER — Ambulatory Visit
Admission: RE | Admit: 2016-11-27 | Discharge: 2016-11-27 | Disposition: A | Discharge status: Home / Self Care | Payer: Medicaid Other | Source: Ambulatory Visit | Attending: Obstetrics & Gynecology | Admitting: Obstetrics & Gynecology

## 2016-11-27 DIAGNOSIS — Z1231 Encounter for screening mammogram for malignant neoplasm of breast: Secondary | ICD-10-CM

## 2016-12-22 ENCOUNTER — Other Ambulatory Visit: Payer: Self-pay | Admitting: Family Medicine

## 2016-12-22 DIAGNOSIS — I10 Essential (primary) hypertension: Secondary | ICD-10-CM

## 2016-12-26 ENCOUNTER — Encounter (HOSPITAL_COMMUNITY): Payer: Self-pay | Admitting: Emergency Medicine

## 2016-12-26 ENCOUNTER — Ambulatory Visit (HOSPITAL_COMMUNITY)
Admission: EM | Admit: 2016-12-26 | Discharge: 2016-12-26 | Disposition: A | Discharge status: Home / Self Care | Payer: Medicaid Other | Attending: Family Medicine | Admitting: Family Medicine

## 2016-12-26 DIAGNOSIS — E1343 Other specified diabetes mellitus with diabetic autonomic (poly)neuropathy: Secondary | ICD-10-CM

## 2016-12-26 MED ORDER — GABAPENTIN 300 MG PO CAPS: 300.0000 mg | ORAL_CAPSULE | Freq: Three times a day (TID) | ORAL | 0 refills | Status: DC

## 2016-12-26 NOTE — ED Provider Notes (Signed)
CSN: [id number]     Arrival date & time 12/26/16  1003 History   None    Chief Complaint  Patient presents with  . Foot Pain   (Consider location/radiation/quality/duration/timing/severity/associated sxs/prior Treatment) Patient c/o bilateral foot numbness and discomfort for 2 months.   The history is provided by the patient.  Foot Pain  This is a chronic problem. The current episode started more than 1 week ago. The problem occurs constantly. The problem has not changed since onset.Nothing aggravates the symptoms.    Past Medical History:  Diagnosis Date  . Diabetes mellitus   . Environmental allergies   . Fibroid   . High cholesterol   . Hx of cardiovascular stress test    a. ETT-MV 2/14:  EF 46% (normal by echo), low risk, no ischemia or scar  . Hx of echocardiogram    Echo 2/14: mild LVH, EF 55-60%, Gr 1 diast dysfn, mild LAE  . Hypertension   . Hyperthyroidism    Radioactive iodine ablation   Past Surgical History:  Procedure Laterality Date  . CESAREAN SECTION  1983, 2001   x 2   Family History  Problem Relation Age of Onset  . Pneumonia Mother   . Diabetes Mother   . Hypertension Father   . Hypertension Sister   . Hyperlipidemia Sister   . Hypertension Brother   . Hypertension Maternal Aunt   . Hypertension Paternal Aunt   . Cancer Paternal Aunt     throat cancer  . Diabetes Maternal Grandmother   . Diabetes Paternal Grandmother   . Cancer Paternal Grandmother     pancreatic cancer   Social History  Substance Use Topics  . Smoking status: Never Smoker  . Smokeless tobacco: Never Used  . Alcohol use No     Comment: occasional for special occasions   OB History    Gravida Para Term Preterm AB Living   4 3 3   1 3    SAB TAB Ectopic Multiple Live Births   1       3     Review of Systems  Constitutional: Negative.   HENT: Negative.   Eyes: Negative.   Respiratory: Negative.   Cardiovascular: Negative.   Gastrointestinal: Negative.    Endocrine: Negative.   Genitourinary: Negative.   Musculoskeletal: Negative.   Allergic/Immunologic: Negative.   Neurological: Positive for numbness.  Hematological: Negative.   Psychiatric/Behavioral: Negative.     Allergies  Patient has no known allergies.  Home Medications   Prior to Admission medications   Medication Sig Start Date End Date Taking? Authorizing Provider  amLODipine (NORVASC) 10 MG tablet Take 1 tablet (10 mg total) by mouth daily. 06/18/16  Yes Sela Hua, MD  aspirin EC 81 MG tablet Take 81 mg by mouth daily.   Yes Historical Provider, MD  benzonatate (TESSALON) 100 MG capsule Take 1 capsule (100 mg total) by mouth 2 (two) times daily as needed for cough. 11/16/16  Yes Verner Mould, MD  cloNIDine (CATAPRES) 0.3 MG tablet TAKE 1 TABLET BY MOUTH TWICE A DAY 12/26/16  Yes Donnamae Jude, MD  cromolyn (OPTICROM) 4 % ophthalmic solution Place 1 drop into both eyes 2 (two) times daily as needed. EYE ALLERGIES   Yes Historical Provider, MD  diclofenac sodium (VOLTAREN) 1 % GEL Apply 2 g topically 4 (four) times daily. 06/01/16  Yes Virginia Crews, MD  glipiZIDE (GLUCOTROL) 10 MG tablet Take 1 tablet (10 mg total) by mouth 2 (two) times  daily before a meal. 06/01/16  Yes Virginia Crews, MD  hydrochlorothiazide (HYDRODIURIL) 25 MG tablet TAKE 1 TABLET (25 MG TOTAL) BY MOUTH DAILY. 04/09/16  Yes Asiyah Cletis Media, MD  ibuprofen (ADVIL,MOTRIN) 800 MG tablet Take 1 tablet (800 mg total) by mouth 3 (three) times daily with meals as needed for headache or moderate pain. 10/22/16  Yes Osborne Oman, MD  losartan (COZAAR) 25 MG tablet Take 1 tablet (25 mg total) by mouth daily. 11/16/16  Yes Verner Mould, MD  megestrol (MEGACE) 40 MG tablet Take 2 tablets (80 mg total) by mouth 2 (two) times daily. Can increase to 3 tablets twice a day for heavy bleeding 10/22/16  Yes Osborne Oman, MD  metFORMIN (GLUCOPHAGE) 1000 MG tablet Take 1 tablet (1,000  mg total) by mouth 2 (two) times daily with a meal. 04/09/16  Yes Asiyah Cletis Media, MD  metoprolol succinate (TOPROL XL) 50 MG 24 hr tablet Take 1 tablet (50 mg total) by mouth daily. Take with or immediately following a meal. 04/09/16  Yes Asiyah Cletis Media, MD  gabapentin (NEURONTIN) 300 MG capsule Take 1 capsule (300 mg total) by mouth 3 (three) times daily. 12/26/16   Lysbeth Penner, FNP   Meds Ordered and Administered this Visit  Medications - No data to display  BP 117/76 (BP Location: Right Arm)   Pulse 70   Temp 98 F (36.7 C) (Oral)   SpO2 100%  No data found.   Physical Exam  Constitutional: She is oriented to person, place, and time. She appears well-developed and well-nourished.  HENT:  Head: Normocephalic and atraumatic.  Eyes: Conjunctivae and EOM are normal. Pupils are equal, round, and reactive to light.  Neck: Normal range of motion. Neck supple.  Cardiovascular: Normal rate, regular rhythm and normal heart sounds.   Pulmonary/Chest: Effort normal and breath sounds normal.  Musculoskeletal:  Bilateral feet with decreased sensation to palpation consistent with neuropathy   Neurological: She is alert and oriented to person, place, and time.  Nursing note and vitals reviewed.   Urgent Care Course     Procedures (including critical care time)  Labs Review Labs Reviewed - No data to display  Imaging Review No results found.   Visual Acuity Review  Right Eye Distance:   Left Eye Distance:   Bilateral Distance:    Right Eye Near:   Left Eye Near:    Bilateral Near:         MDM   1. 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

## 2016-12-26 NOTE — ED Triage Notes (Signed)
Pt reports pain in the bottom of her feet bilaterally for two months.  She describes the pain as walking on glass.

## 2017-01-10 ENCOUNTER — Ambulatory Visit: Payer: Medicaid Other | Admitting: Family Medicine

## 2017-01-11 ENCOUNTER — Emergency Department (HOSPITAL_COMMUNITY): Payer: Medicaid Other

## 2017-01-11 ENCOUNTER — Encounter (HOSPITAL_COMMUNITY): Payer: Self-pay

## 2017-01-11 ENCOUNTER — Inpatient Hospital Stay (HOSPITAL_COMMUNITY)
Admission: EM | Admit: 2017-01-11 | Discharge: 2017-01-15 | DRG: 638 | Disposition: A | Discharge status: Home / Self Care | Payer: Medicaid Other | Attending: Family Medicine | Admitting: Family Medicine

## 2017-01-11 DIAGNOSIS — K8020 Calculus of gallbladder without cholecystitis without obstruction: Secondary | ICD-10-CM | POA: Diagnosis present

## 2017-01-11 DIAGNOSIS — Z79899 Other long term (current) drug therapy: Secondary | ICD-10-CM

## 2017-01-11 DIAGNOSIS — R739 Hyperglycemia, unspecified: Secondary | ICD-10-CM

## 2017-01-11 DIAGNOSIS — E1110 Type 2 diabetes mellitus with ketoacidosis without coma: Principal | ICD-10-CM | POA: Diagnosis present

## 2017-01-11 DIAGNOSIS — E119 Type 2 diabetes mellitus without complications: Secondary | ICD-10-CM

## 2017-01-11 DIAGNOSIS — Z6841 Body Mass Index (BMI) 40.0 and over, adult: Secondary | ICD-10-CM | POA: Diagnosis not present

## 2017-01-11 DIAGNOSIS — Z7984 Long term (current) use of oral hypoglycemic drugs: Secondary | ICD-10-CM | POA: Diagnosis not present

## 2017-01-11 DIAGNOSIS — D500 Iron deficiency anemia secondary to blood loss (chronic): Secondary | ICD-10-CM | POA: Diagnosis present

## 2017-01-11 DIAGNOSIS — E1310 Other specified diabetes mellitus with ketoacidosis without coma: Secondary | ICD-10-CM | POA: Diagnosis not present

## 2017-01-11 DIAGNOSIS — Z7982 Long term (current) use of aspirin: Secondary | ICD-10-CM | POA: Diagnosis not present

## 2017-01-11 DIAGNOSIS — E860 Dehydration: Secondary | ICD-10-CM

## 2017-01-11 DIAGNOSIS — R1084 Generalized abdominal pain: Secondary | ICD-10-CM | POA: Diagnosis not present

## 2017-01-11 DIAGNOSIS — E872 Acidosis: Secondary | ICD-10-CM

## 2017-01-11 DIAGNOSIS — I10 Essential (primary) hypertension: Secondary | ICD-10-CM | POA: Diagnosis present

## 2017-01-11 DIAGNOSIS — D219 Benign neoplasm of connective and other soft tissue, unspecified: Secondary | ICD-10-CM | POA: Diagnosis present

## 2017-01-11 DIAGNOSIS — Z794 Long term (current) use of insulin: Secondary | ICD-10-CM

## 2017-01-11 DIAGNOSIS — Z833 Family history of diabetes mellitus: Secondary | ICD-10-CM | POA: Diagnosis not present

## 2017-01-11 DIAGNOSIS — R1030 Lower abdominal pain, unspecified: Secondary | ICD-10-CM | POA: Diagnosis present

## 2017-01-11 DIAGNOSIS — E1142 Type 2 diabetes mellitus with diabetic polyneuropathy: Secondary | ICD-10-CM | POA: Diagnosis present

## 2017-01-11 DIAGNOSIS — D259 Leiomyoma of uterus, unspecified: Secondary | ICD-10-CM | POA: Diagnosis present

## 2017-01-11 DIAGNOSIS — R109 Unspecified abdominal pain: Secondary | ICD-10-CM

## 2017-01-11 DIAGNOSIS — Z9114 Patient's other noncompliance with medication regimen: Secondary | ICD-10-CM | POA: Diagnosis not present

## 2017-01-11 DIAGNOSIS — R319 Hematuria, unspecified: Secondary | ICD-10-CM | POA: Diagnosis present

## 2017-01-11 DIAGNOSIS — R809 Proteinuria, unspecified: Secondary | ICD-10-CM | POA: Diagnosis present

## 2017-01-11 DIAGNOSIS — K5790 Diverticulosis of intestine, part unspecified, without perforation or abscess without bleeding: Secondary | ICD-10-CM | POA: Diagnosis present

## 2017-01-11 DIAGNOSIS — N939 Abnormal uterine and vaginal bleeding, unspecified: Secondary | ICD-10-CM | POA: Diagnosis present

## 2017-01-11 DIAGNOSIS — T383X6A Underdosing of insulin and oral hypoglycemic [antidiabetic] drugs, initial encounter: Secondary | ICD-10-CM | POA: Diagnosis present

## 2017-01-11 DIAGNOSIS — E8729 Other acidosis: Secondary | ICD-10-CM

## 2017-01-11 DIAGNOSIS — R634 Abnormal weight loss: Secondary | ICD-10-CM | POA: Diagnosis present

## 2017-01-11 DIAGNOSIS — E7800 Pure hypercholesterolemia, unspecified: Secondary | ICD-10-CM | POA: Diagnosis present

## 2017-01-11 LAB — URINALYSIS, ROUTINE W REFLEX MICROSCOPIC
Bilirubin Urine: NEGATIVE
Glucose, UA: >=500 mg/dL — AB
Ketones, ur: 20 mg/dL — AB
Leukocytes, UA: NEGATIVE
NITRITE: NEGATIVE
PROTEIN: 30 mg/dL — AB
Specific Gravity, Urine: 1.026 (ref 1.005–1.030)
Squamous Epithelial / HPF: NONE SEEN
pH: 5 (ref 5.0–8.0)

## 2017-01-11 LAB — BASIC METABOLIC PANEL
ANION GAP: 9 (ref 5–15)
ANION GAP: 8 (ref 5–15)
Anion gap: 16 — ABNORMAL HIGH (ref 5–15)
Anion gap: 9 (ref 5–15)
BUN: 15 mg/dL (ref 6–20)
BUN: 13 mg/dL (ref 6–20)
BUN: 11 mg/dL (ref 6–20)
BUN: 11 mg/dL (ref 6–20)
CALCIUM: 10.4 mg/dL — AB (ref 8.9–10.3)
CALCIUM: 10.5 mg/dL — AB (ref 8.9–10.3)
CALCIUM: 10.5 mg/dL — AB (ref 8.9–10.3)
CHLORIDE: 101 mmol/L (ref 101–111)
CO2: 15 mmol/L — AB (ref 22–32)
CO2: 23 mmol/L (ref 22–32)
CO2: 23 mmol/L (ref 22–32)
CO2: 23 mmol/L (ref 22–32)
CREATININE: 1.08 mg/dL — AB (ref 0.44–1.00)
CREATININE: 1.04 mg/dL — AB (ref 0.44–1.00)
Calcium: 11.1 mg/dL — ABNORMAL HIGH (ref 8.9–10.3)
Chloride: 104 mmol/L (ref 101–111)
Chloride: 106 mmol/L (ref 101–111)
Chloride: 106 mmol/L (ref 101–111)
Creatinine, Ser: 1.19 mg/dL — ABNORMAL HIGH (ref 0.44–1.00)
Creatinine, Ser: 1.02 mg/dL — ABNORMAL HIGH (ref 0.44–1.00)
GFR calc Af Amer: 58 mL/min — ABNORMAL LOW (ref >60)
GFR calc non Af Amer: 50 mL/min — ABNORMAL LOW (ref >60)
GFR calc non Af Amer: 56 mL/min — ABNORMAL LOW (ref >60)
GFR, EST NON AFRICAN AMERICAN: 59 mL/min — AB (ref >60)
Glucose, Bld: 470 mg/dL — ABNORMAL HIGH (ref 65–99)
Glucose, Bld: 299 mg/dL — ABNORMAL HIGH (ref 65–99)
Glucose, Bld: 140 mg/dL — ABNORMAL HIGH (ref 65–99)
Glucose, Bld: 142 mg/dL — ABNORMAL HIGH (ref 65–99)
POTASSIUM: 3 mmol/L — AB (ref 3.5–5.1)
Potassium: 3.7 mmol/L (ref 3.5–5.1)
Potassium: 3.2 mmol/L — ABNORMAL LOW (ref 3.5–5.1)
Potassium: 3.6 mmol/L (ref 3.5–5.1)
SODIUM: 138 mmol/L (ref 135–145)
Sodium: 132 mmol/L — ABNORMAL LOW (ref 135–145)
Sodium: 136 mmol/L (ref 135–145)
Sodium: 137 mmol/L (ref 135–145)

## 2017-01-11 LAB — T4, FREE: FREE T4: 1.16 ng/dL — AB (ref 0.61–1.12)

## 2017-01-11 LAB — CBG MONITORING, ED
GLUCOSE-CAPILLARY: 356 mg/dL — AB (ref 65–99)
GLUCOSE-CAPILLARY: 236 mg/dL — AB (ref 65–99)
Glucose-Capillary: 327 mg/dL — ABNORMAL HIGH (ref 65–99)
Glucose-Capillary: 284 mg/dL — ABNORMAL HIGH (ref 65–99)

## 2017-01-11 LAB — HEPATIC FUNCTION PANEL
ALT: 12 U/L — AB (ref 14–54)
AST: 11 U/L — ABNORMAL LOW (ref 15–41)
Albumin: 2.6 g/dL — ABNORMAL LOW (ref 3.5–5.0)
Alkaline Phosphatase: 72 U/L (ref 38–126)
TOTAL PROTEIN: 7.4 g/dL (ref 6.5–8.1)
Total Bilirubin: 0.6 mg/dL (ref 0.3–1.2)

## 2017-01-11 LAB — GLUCOSE, CAPILLARY
GLUCOSE-CAPILLARY: 155 mg/dL — AB (ref 65–99)
Glucose-Capillary: 147 mg/dL — ABNORMAL HIGH (ref 65–99)
Glucose-Capillary: 157 mg/dL — ABNORMAL HIGH (ref 65–99)

## 2017-01-11 LAB — CBC
HCT: 33.2 % — ABNORMAL LOW (ref 36.0–46.0)
HCT: 29.6 % — ABNORMAL LOW (ref 36.0–46.0)
HEMOGLOBIN: 11.5 g/dL — AB (ref 12.0–15.0)
Hemoglobin: 9.5 g/dL — ABNORMAL LOW (ref 12.0–15.0)
MCH: 26.7 pg (ref 26.0–34.0)
MCH: 23.9 pg — ABNORMAL LOW (ref 26.0–34.0)
MCHC: 34.6 g/dL (ref 30.0–36.0)
MCHC: 32.1 g/dL (ref 30.0–36.0)
MCV: 77.2 fL — AB (ref 78.0–100.0)
MCV: 74.4 fL — AB (ref 78.0–100.0)
PLATELETS: 329 10*3/uL (ref 150–400)
Platelets: 357 10*3/uL (ref 150–400)
RBC: 4.3 MIL/uL (ref 3.87–5.11)
RBC: 3.98 MIL/uL (ref 3.87–5.11)
RDW: 15.3 % (ref 11.5–15.5)
RDW: 15.6 % — AB (ref 11.5–15.5)
WBC: 13.3 10*3/uL — ABNORMAL HIGH (ref 4.0–10.5)
WBC: 12 10*3/uL — AB (ref 4.0–10.5)

## 2017-01-11 LAB — TSH: TSH: 0.827 u[IU]/mL (ref 0.350–4.500)

## 2017-01-11 MED ORDER — SODIUM CHLORIDE 0.9 % IV SOLN
INTRAVENOUS | Status: DC
  Administered 2017-01-11: 17:00:00 via INTRAVENOUS

## 2017-01-11 MED ORDER — INSULIN ASPART 100 UNIT/ML ~~LOC~~ SOLN
0.0000 [IU] | Freq: Every day | SUBCUTANEOUS | Status: DC
  Administered 2017-01-13: 3 [IU] via SUBCUTANEOUS
  Administered 2017-01-13: 4 [IU] via SUBCUTANEOUS
  Administered 2017-01-14: 3 [IU] via SUBCUTANEOUS

## 2017-01-11 MED ORDER — SODIUM CHLORIDE 0.9 % IV BOLUS (SEPSIS)
1000.0000 mL | Freq: Once | INTRAVENOUS | Status: AC
  Administered 2017-01-11: 1000 mL via INTRAVENOUS

## 2017-01-11 MED ORDER — IBUPROFEN 400 MG PO TABS
400.0000 mg | ORAL_TABLET | Freq: Once | ORAL | Status: AC
  Administered 2017-01-11: 400 mg via ORAL
  Filled 2017-01-11: qty 1

## 2017-01-11 MED ORDER — SODIUM CHLORIDE 0.9 % IV SOLN
INTRAVENOUS | Status: DC
  Administered 2017-01-11: 2.7 [IU]/h via INTRAVENOUS
  Filled 2017-01-11: qty 2.5

## 2017-01-11 MED ORDER — ACETAMINOPHEN 325 MG PO TABS
650.0000 mg | ORAL_TABLET | Freq: Four times a day (QID) | ORAL | Status: DC | PRN
  Administered 2017-01-11 – 2017-01-15 (×5): 650 mg via ORAL
  Filled 2017-01-11 (×5): qty 2

## 2017-01-11 MED ORDER — DEXTROSE-NACL 5-0.45 % IV SOLN
INTRAVENOUS | Status: DC
  Administered 2017-01-11: 20:00:00 via INTRAVENOUS

## 2017-01-11 MED ORDER — CLONIDINE HCL 0.3 MG PO TABS
0.3000 mg | ORAL_TABLET | Freq: Two times a day (BID) | ORAL | Status: DC
  Administered 2017-01-11 – 2017-01-15 (×8): 0.3 mg via ORAL
  Filled 2017-01-11 (×9): qty 1

## 2017-01-11 MED ORDER — DEXTROSE-NACL 5-0.45 % IV SOLN
INTRAVENOUS | Status: DC
  Administered 2017-01-11: 1000 mL via INTRAVENOUS

## 2017-01-11 MED ORDER — ACETAMINOPHEN 500 MG PO TABS
1000.0000 mg | ORAL_TABLET | Freq: Once | ORAL | Status: AC
  Administered 2017-01-11: 1000 mg via ORAL
  Filled 2017-01-11: qty 2

## 2017-01-11 MED ORDER — IOPAMIDOL (ISOVUE-300) INJECTION 61%
INTRAVENOUS | Status: AC
  Administered 2017-01-11: 100 mL
  Filled 2017-01-11: qty 100

## 2017-01-11 MED ORDER — INSULIN ASPART 100 UNIT/ML ~~LOC~~ SOLN
4.0000 [IU] | Freq: Three times a day (TID) | SUBCUTANEOUS | Status: DC
  Administered 2017-01-12 – 2017-01-15 (×9): 4 [IU] via SUBCUTANEOUS

## 2017-01-11 MED ORDER — INSULIN ASPART 100 UNIT/ML ~~LOC~~ SOLN
0.0000 [IU] | Freq: Three times a day (TID) | SUBCUTANEOUS | Status: DC
  Administered 2017-01-12 – 2017-01-13 (×4): 3 [IU] via SUBCUTANEOUS
  Administered 2017-01-13: 5 [IU] via SUBCUTANEOUS
  Administered 2017-01-13 – 2017-01-14 (×2): 3 [IU] via SUBCUTANEOUS
  Administered 2017-01-14: 5 [IU] via SUBCUTANEOUS
  Administered 2017-01-14: 7 [IU] via SUBCUTANEOUS
  Administered 2017-01-15 (×2): 3 [IU] via SUBCUTANEOUS

## 2017-01-11 MED ORDER — ENOXAPARIN SODIUM 40 MG/0.4ML ~~LOC~~ SOLN
40.0000 mg | SUBCUTANEOUS | Status: DC
  Administered 2017-01-11: 40 mg via SUBCUTANEOUS
  Filled 2017-01-11: qty 0.4

## 2017-01-11 MED ORDER — SODIUM CHLORIDE 0.9 % IV SOLN
30.0000 meq | Freq: Once | INTRAVENOUS | Status: AC
  Administered 2017-01-11: 30 meq via INTRAVENOUS
  Filled 2017-01-11: qty 15

## 2017-01-11 MED ORDER — INSULIN ASPART 100 UNIT/ML ~~LOC~~ SOLN
12.0000 [IU] | Freq: Once | SUBCUTANEOUS | Status: AC
  Administered 2017-01-11: 12 [IU] via INTRAVENOUS
  Filled 2017-01-11: qty 1

## 2017-01-11 MED ORDER — SODIUM CHLORIDE 0.9 % IV SOLN
INTRAVENOUS | Status: DC
  Filled 2017-01-11: qty 2.5

## 2017-01-11 MED ORDER — INSULIN GLARGINE 100 UNIT/ML ~~LOC~~ SOLN
15.0000 [IU] | Freq: Every day | SUBCUTANEOUS | Status: DC
  Administered 2017-01-12 – 2017-01-13 (×2): 15 [IU] via SUBCUTANEOUS
  Filled 2017-01-11 (×4): qty 0.15

## 2017-01-11 MED ORDER — SODIUM CHLORIDE 0.9 % IV SOLN
INTRAVENOUS | Status: DC
  Administered 2017-01-11: 1000 mL via INTRAVENOUS
  Administered 2017-01-13: 09:00:00 via INTRAVENOUS

## 2017-01-11 NOTE — ED Notes (Signed)
Attempted to give report however nurse was unavailable.

## 2017-01-11 NOTE — ED Notes (Signed)
Sent add on label to main lab ([facility])

## 2017-01-11 NOTE — ED Triage Notes (Signed)
Pt. Has fibroids and has been bleeding excessively. She has been using at least 15-20 pads per day since January. Say Gynecologist has known about it, but it has progressively gotten worse. Cramping all over stomach, but especially in the LLQ.

## 2017-01-11 NOTE — ED Provider Notes (Addendum)
[facility] DEPT Provider Note   CSN: [id number] Arrival date & time: 01/11/17  1287     History   Chief Complaint Chief Complaint  Patient presents with  . Abdominal Pain  . Vaginal Bleeding    HPI Andrea [name] is a 56 y.o. female.  Patient with hx niddm, c/o bilateral lower abdominal pain, worse on left, generalized weakness, nausea, poor po intake, for the past couple days. Also states vaginal bleeding, c/w prior bleeding she has from uterine fibroids. Denies hx diverticula. No abn vaginal discharge. Denies fever or chills. No dysuria.  Symptoms gradual in onset, moderate, persistent worse today. +polyuria.    The history is provided by the patient.    Past Medical History:  Diagnosis Date  . Diabetes mellitus   . Environmental allergies   . Fibroid   . High cholesterol   . Hx of cardiovascular stress test    a. ETT-MV 2/14:  EF 46% (normal by echo), low risk, no ischemia or scar  . Hx of echocardiogram    Echo 2/14: mild LVH, EF 55-60%, Gr 1 diast dysfn, mild LAE  . Hypertension   . Hyperthyroidism    Radioactive iodine ablation    Patient Active Problem List   Diagnosis Date Noted  . Persistent cough 11/16/2016  . Weight loss 04/09/2016  . Headache 04/09/2016  . Fibroids 04/14/2014  . Abnormal uterine bleeding (AUB) 04/14/2014  . Unequal leg length 04/16/2013  . Lightheadedness 12/17/2012  . Chest pain 12/14/2012  . Morbid obesity ([place]) 12/29/2011  . High cholesterol   . 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 ANXIETY 02/20/2010  . GERD 01/30/2010  . THYROID NODULE 01/17/2010  . THYROMEGALY 01/16/2010  . DEPRESSION 01/16/2010  . Type II diabetes mellitus ([place]) 09/18/2007  . Essential hypertension, benign 09/18/2007    Past Surgical History:  Procedure Laterality Date  . [place], 2001   x 2    OB History    Gravida Para Term Preterm AB Living   4 3 3   1 3    SAB TAB Ectopic Multiple Live Births   1       3        Home Medications    Prior to Admission medications   Medication Sig Start Date End Date Taking? Authorizing Provider  amLODipine (NORVASC) 10 MG tablet Take 1 tablet (10 mg total) by mouth daily. 06/18/16  Yes Sela Hua, MD  aspirin EC 81 MG tablet Take 81 mg by mouth daily.   Yes Historical Provider, MD  cloNIDine (CATAPRES) 0.3 MG tablet TAKE 1 TABLET BY MOUTH TWICE A DAY 12/26/16  Yes Donnamae Jude, MD  cromolyn (OPTICROM) 4 % ophthalmic solution Place 1 drop into both eyes 2 (two) times daily as needed. EYE ALLERGIES   Yes Historical Provider, MD  diclofenac sodium (VOLTAREN) 1 % GEL Apply 2 g topically 4 (four) times daily. Patient taking differently: Apply 2 g topically daily as needed (pain).  06/01/16  Yes Virginia Crews, MD  gabapentin (NEURONTIN) 300 MG capsule Take 1 capsule (300 mg total) by mouth 3 (three) times daily. Patient taking differently: Take 300 mg by mouth 3 (three) times daily as needed (nerve pain).  12/26/16  Yes Lysbeth Penner, FNP  glipiZIDE (GLUCOTROL) 10 MG tablet Take 1 tablet (10 mg total) by mouth 2 (two) times daily before a meal. 06/01/16  Yes Virginia Crews, MD  hydrochlorothiazide (HYDRODIURIL) 25 MG tablet TAKE 1 TABLET (25  MG TOTAL) BY MOUTH DAILY. 04/09/16  Yes Asiyah Cletis Media, MD  ibuprofen (ADVIL,MOTRIN) 800 MG tablet Take 1 tablet (800 mg total) by mouth 3 (three) times daily with meals as needed for headache or moderate pain. 10/22/16  Yes Osborne Oman, MD  losartan (COZAAR) 25 MG tablet Take 1 tablet (25 mg total) by mouth daily. 11/16/16  Yes Verner Mould, MD  megestrol (MEGACE) 40 MG tablet Take 2 tablets (80 mg total) by mouth 2 (two) times daily. Can increase to 3 tablets twice a day for heavy bleeding 10/22/16  Yes Osborne Oman, MD  metFORMIN (GLUCOPHAGE) 1000 MG tablet Take 1 tablet (1,000 mg total) by mouth 2 (two) times daily with a meal. 04/09/16  Yes Asiyah Cletis Media, MD  metoprolol succinate (TOPROL  XL) 50 MG 24 hr tablet Take 1 tablet (50 mg total) by mouth daily. Take with or immediately following a meal. 04/09/16  Yes Asiyah Cletis Media, MD  benzonatate (TESSALON) 100 MG capsule Take 1 capsule (100 mg total) by mouth 2 (two) times daily as needed for cough. Patient not taking: Reported on 01/11/2017 11/16/16   Verner Mould, MD    Family History Family History  Problem Relation Age of Onset  . Pneumonia Mother   . Diabetes Mother   . Hypertension Father   . Hypertension Sister   . Hyperlipidemia Sister   . Hypertension Brother   . Hypertension Maternal Aunt   . Hypertension Paternal Aunt   . Cancer Paternal Aunt     throat cancer  . Diabetes Maternal Grandmother   . Diabetes Paternal Grandmother   . Cancer Paternal Grandmother     pancreatic cancer    Social History Social History  Substance Use Topics  . Smoking status: Never Smoker  . Smokeless tobacco: Never Used  . Alcohol use No     Comment: occasional for special occasions     Allergies   Patient has no known allergies.   Review of Systems Review of Systems  Constitutional: Negative for chills and fever.  HENT: Negative for sore throat.   Eyes: Negative for redness.  Respiratory: Negative for shortness of breath.   Cardiovascular: Negative for chest pain.  Gastrointestinal: Positive for nausea. Negative for abdominal pain.  Endocrine: Positive for polyuria.  Genitourinary: Negative for flank pain.  Musculoskeletal: Negative for back pain and neck pain.  Skin: Negative for rash.  Neurological: Negative for headaches.  Hematological: Does not bruise/bleed easily.  Psychiatric/Behavioral: Negative for confusion.     Physical Exam Updated Vital Signs BP 139/77   Pulse 61   SpO2 96%   Physical Exam  Constitutional: She appears well-developed and well-nourished. No distress.  HENT:  Mouth/Throat: Oropharynx is clear and moist.  Eyes: Conjunctivae are normal. No scleral icterus.  Neck:  Neck supple. No tracheal deviation present.  Cardiovascular: Normal rate, regular rhythm, normal heart sounds and intact distal pulses.  Exam reveals no gallop and no friction rub.   No murmur heard. Pulmonary/Chest: Effort normal and breath sounds normal. No respiratory distress.  Abdominal: Soft. Normal appearance and bowel sounds are normal. She exhibits no distension and no mass. There is tenderness. There is no rebound and no guarding. No hernia.  Suprapubic and left abd tenderness, no rebound or guarding.   Genitourinary:  Genitourinary Comments: No cva tenderness  Musculoskeletal: She exhibits no edema.  Neurological: She is alert.  Skin: Skin is warm and dry. No rash noted. She is not diaphoretic.  Psychiatric:  She has a normal mood and affect.  Nursing note and vitals reviewed.    ED Treatments / Results  Labs (all labs ordered are listed, but only abnormal results are displayed) Results for orders placed or performed during the hospital encounter of 01/11/17  CBC  Result Value Ref Range   WBC 13.3 (H) 4.0 - 10.5 K/uL   RBC 4.30 3.87 - 5.11 MIL/uL   Hemoglobin 11.5 (L) 12.0 - 15.0 g/dL   HCT 33.2 (L) 36.0 - 46.0 %   MCV 77.2 (L) 78.0 - 100.0 fL   MCH 26.7 26.0 - 34.0 pg   MCHC 34.6 30.0 - 36.0 g/dL   RDW 15.3 11.5 - 15.5 %   Platelets 357 150 - 400 K/uL  Basic metabolic panel  Result Value Ref Range   Sodium 132 (L) 135 - 145 mmol/L   Potassium 3.7 3.5 - 5.1 mmol/L   Chloride 101 101 - 111 mmol/L   CO2 15 (L) 22 - 32 mmol/L   Glucose, Bld 470 (H) 65 - 99 mg/dL   BUN 15 6 - 20 mg/dL   Creatinine, Ser 1.19 (H) 0.44 - 1.00 mg/dL   Calcium 11.1 (H) 8.9 - 10.3 mg/dL   GFR calc non Af Amer 50 (L) >60 mL/min   GFR calc Af Amer 58 (L) >60 mL/min   Anion gap 16 (H) 5 - 15  Urinalysis, Routine w reflex microscopic  Result Value Ref Range   Color, Urine YELLOW YELLOW   APPearance CLEAR CLEAR   Specific Gravity, Urine 1.026 1.005 - 1.030   pH 5.0 5.0 - 8.0   Glucose, UA  >=500 (A) NEGATIVE mg/dL   Hgb urine dipstick MODERATE (A) NEGATIVE   Bilirubin Urine NEGATIVE NEGATIVE   Ketones, ur 20 (A) NEGATIVE mg/dL   Protein, ur 30 (A) NEGATIVE mg/dL   Nitrite NEGATIVE NEGATIVE   Leukocytes, UA NEGATIVE NEGATIVE   RBC / HPF 6-30 0 - 5 RBC/hpf   WBC, UA 0-5 0 - 5 WBC/hpf   Bacteria, UA RARE (A) NONE SEEN   Squamous Epithelial / LPF NONE SEEN NONE SEEN   Mucous PRESENT   POC CBG, ED  Result Value Ref Range   Glucose-Capillary 356 (H) 65 - 99 mg/dL   Ct Abdomen Pelvis W Contrast  Result Date: 01/11/2017 CLINICAL DATA:  Left lower quadrant abdominal pain. EXAM: CT ABDOMEN AND PELVIS WITH CONTRAST TECHNIQUE: Multidetector CT imaging of the abdomen and pelvis was performed using the standard protocol following bolus administration of intravenous contrast. CONTRAST:  100 mL of Isovue-300 intravenously. COMPARISON:  None. FINDINGS: Lower chest: No acute abnormality. Hepatobiliary: Mild cholelithiasis is noted. No focal abnormality is noted in the liver. Pancreas: Unremarkable. No pancreatic ductal dilatation or surrounding inflammatory changes. Spleen: Normal in size without focal abnormality. Adrenals/Urinary Tract: Adrenal glands appear normal. Bilateral simple renal cysts are noted. No hydronephrosis or renal obstruction is noted. No renal or ureteral calculi are noted. Urinary bladder is unremarkable. Stomach/Bowel: The appendix appears normal. There is no evidence of bowel obstruction. Diverticulosis of descending and sigmoid colon is noted without inflammation. Vascular/Lymphatic: No significant vascular findings are present. No enlarged abdominal or pelvic lymph nodes. Reproductive: Enlarged fibroid uterus is noted. Ovaries are unremarkable. Other: No abdominal wall hernia or abnormality. No abdominopelvic ascites. Musculoskeletal: No acute or significant osseous findings. IMPRESSION: Mild cholelithiasis without evidence of inflammation. Diverticulosis of descending and  sigmoid colon without inflammation. Enlarged fibroid uterus. No other abnormality seen in the abdomen or pelvis. Electronically  Signed   By: Marijo Conception, M.D.   On: 01/11/2017 12:40    EKG  EKG Interpretation None       Radiology Ct Abdomen Pelvis W Contrast  Result Date: 01/11/2017 CLINICAL DATA:  Left lower quadrant abdominal pain. EXAM: CT ABDOMEN AND PELVIS WITH CONTRAST TECHNIQUE: Multidetector CT imaging of the abdomen and pelvis was performed using the standard protocol following bolus administration of intravenous contrast. CONTRAST:  100 mL of Isovue-300 intravenously. COMPARISON:  None. FINDINGS: Lower chest: No acute abnormality. Hepatobiliary: Mild cholelithiasis is noted. No focal abnormality is noted in the liver. Pancreas: Unremarkable. No pancreatic ductal dilatation or surrounding inflammatory changes. Spleen: Normal in size without focal abnormality. Adrenals/Urinary Tract: Adrenal glands appear normal. Bilateral simple renal cysts are noted. No hydronephrosis or renal obstruction is noted. No renal or ureteral calculi are noted. Urinary bladder is unremarkable. Stomach/Bowel: The appendix appears normal. There is no evidence of bowel obstruction. Diverticulosis of descending and sigmoid colon is noted without inflammation. Vascular/Lymphatic: No significant vascular findings are present. No enlarged abdominal or pelvic lymph nodes. Reproductive: Enlarged fibroid uterus is noted. Ovaries are unremarkable. Other: No abdominal wall hernia or abnormality. No abdominopelvic ascites. Musculoskeletal: No acute or significant osseous findings. IMPRESSION: Mild cholelithiasis without evidence of inflammation. Diverticulosis of descending and sigmoid colon without inflammation. Enlarged fibroid uterus. No other abnormality seen in the abdomen or pelvis. Electronically Signed   By: Marijo Conception, M.D.   On: 01/11/2017 12:40    Procedures Procedures (including critical care  time)  Medications Ordered in ED Medications  insulin regular (NOVOLIN R,HUMULIN R) 250 Units in sodium chloride 0.9 % 250 mL (1 Units/mL) infusion (not administered)  dextrose 5 %-0.45 % sodium chloride infusion (not administered)  0.9 %  sodium chloride infusion (not administered)  acetaminophen (TYLENOL) tablet 1,000 mg (1,000 mg Oral Given 01/11/17 0932)  ibuprofen (ADVIL,MOTRIN) tablet 400 mg (400 mg Oral Given 01/11/17 0932)  sodium chloride 0.9 % bolus 1,000 mL (1,000 mLs Intravenous New Bag/Given 01/11/17 1320)  insulin aspart (novoLOG) injection 12 Units (12 Units Intravenous Given 01/11/17 1320)  iopamidol (ISOVUE-300) 61 % injection (100 mLs  Contrast Given 01/11/17 1203)  sodium chloride 0.9 % bolus 1,000 mL (1,000 mLs Intravenous New Bag/Given 01/11/17 1422)     Initial Impression / Assessment and Plan / ED Course  I have reviewed the triage vital signs and the nursing notes.  Pertinent labs & imaging results that were available during my care of the patient were reviewed by me and considered in my medical decision making (see chart for details).  Iv ns bolus. Labs.   Glucose returns very high, 470.   Pt given initial dose subcut insulin, ivf.  On recheck, glucose is still high, hco3 from labs low at 15, elev anion gap 16 - additional ivf and insulin via gluco stabilizer.  Patient indicates she goes to [facility] clinic - resident paged for admission re dehydration, hyperglycemia, mild acidosis.   Discussed pt with FP resident - they will admit.   Final Clinical Impressions(s) / ED Diagnoses   Final diagnoses:  None    New Prescriptions New Prescriptions   No medications on file         Lajean Saver, MD 01/11/17 [phone]

## 2017-01-11 NOTE — ED Notes (Addendum)
Attempted to call report and phone was busy.

## 2017-01-11 NOTE — ED Notes (Signed)
Patient at CT

## 2017-01-11 NOTE — H&P (Signed)
[facility] Admission History and Physical Service Pager: [phone]  Patient name: Andrea [name] Medical record number: [id number] Date of birth: [birth year] Age: 56 y.o. Gender: female  Primary Care Provider: Lavon Paganini, MD Consultants: none Code Status: FULL  Chief Complaint: abdominal pain   Assessment and Plan: Andrea [name] is a 56 y.o. female presenting with abdominal pain and lethargy. PMH is significant for T2DM (last HgbA1c 10.8 04/2016), morbid obesity, anxiety, HTN, GERD, uterine fibroids, and hyperthyroidism s/p radioactive ablation.   DKA/DM-2: last A1c 10.8 about 6 months ago. Patient has not taken metformin or glipizide for several days because she didn't feel like it. She knew when she arrived that her CBG would be high. She thought her A1C was 7. Denies N/V, but does endorse abdominal pain which has improved with fluids. Does endorse polyuria and polydipsia. Also endorses unintentional weight loss. Mild leukocytosis to 13.3 likely due to hemoconcentration. She has no signs or symptoms of infection. UA with glucosuria, hemoglobinuria, mild proteinuria and mild ketones but no nitrites or leukocyte esterase. Likely cause of DKA is poorly controlled DM and medication non adherence. S/p 12U novolog in ED and 2L NS bolus. No signs and symptoms of ACS.  - Admit to stepdown, Dr. Mingo Amber attending  - Glucose stabilizer with 2 bag method: NS with insulin drip to transition to D5 1/2NS with 97meQ K once CBG <250.  - NPO while on insulin drip  - BMPs q4 hour until gap closed x 2, then transition to subq insulin  - hold home DM meds. - A1c  Abdominal pain: patient with known uterine fibroids followed by GYN.  Perimenopausal. CT abdomen with uterine fibroid and diverticula. Patient reports pain in RUQ and is decreased with hydration. Etiology includes DKA, constipation, diverticulosis, hepatitis. On exam, diffuse tenderness to palpation, worse over LLQ,  no guarding or rebound.   - Continue to monitor - Add LFTs to previous collection  HTN: Normotensive. Home meds are norvasc 10mg , clonidine 0.3 BID, HCTZ 25mg , cozaar 25mg , toprol XL 50mg . Patient says she hasn't been taking meds for several days, but did take troprol this am.  - hold PO meds except clonidine while on insulin drip - We'll resume other home meds as tolerated -  Monitor BP  Abnormal Uterine bleeding: Likely due to fibroid. TVUS on 10/17/2016 with 2 fibroid masses measuring 6.1 cm and 2.7 cm. Followed by gynecology outpatient. On megace. Patient reports no change in her bleeding, although it is irregular. CBC stable. Patient had SVE by ED provider and noted to have large vaginal bleed and many clots. She is hemodynamically stable. Hemoglobin at baseline. -resume megace once taking PO - Serial CBC -Consider GYN consult in the morning  Unintentional weight loss: over several months patient has lost greater than 20 pounds without trying. S/p radioactive thyroid ablation, last TSH 0.7. Possible etiologies include thyroid dysfunction, infectious etiologies (consider TB, HIV), malignancy, or IBD.  - adding thyroid function studies to 2200 labs - discuss hx of breast cancer screening  - adding HIV  Peripheral neuropathy: Patient recently started on gabapentin by EDP on 2/21.  - hold gabapentin while NPO   FEN/GI: NPO, 2 bag method with insulin drip  Prophylaxis: lovenox  Disposition: admit to stepdown  History of Present Illness:  Andrea [name] is a 56 y.o. female presenting with abdominal pain that has been present for 2 days. She has not taken any of her PO meds for 2 days because she didn't  feel like it. She reports that she has stopped taking medications for this reason in the past as well. Her boyfriend and her children encourage her to take PO meds. She has had DM for 6-8 years and has been admitted twice before for DKA per her report. Home meds are metformin and glyburide,  and she denies ever being on insulin. When asked what she thought her HgbA1C was, she reports "7," while suggesting that the goal HgbA1C is 5-6. Actual HgbA1c is 10.8. She also became dizzy starting 2 days ago. Thinks dizziness was due to abdominal pain. Decreased energy for two months. No vomiting, no nausea. No viral or flu symptoms. No fevers. Polyuria. Thirsty. No dysuria. No vaginal complaints.    Belly pain - starts in LLQ and moves to other quadrant. Last BM last night. Normally BM BID to 1 per week. Feels much better on my exam that when she came in. Tried some ibuprofen for this at home.   Abnormal uterine Bleeding - no different, just irregular. Thinks she is supposed to take megace only when bleeding.   Recent weight loss (greater than 20 pounds). She has not been trying to lose weight. Notes that her dress size has gone from 24 to 20. Denies every having a colonoscopy because she is afraid of them.  In the ED, CT abd/pelvis performed due to pain > stable uterine fibroid. Glucose stabilizer started due to hyperglycemia persistent after 12U novolog. CBG initially 473. Given 2L NS bolus.   Review Of Systems: Per HPI with the following additions: as below.   Review of Systems  Constitutional: Positive for malaise/fatigue and weight loss.  HENT: Negative for nosebleeds.   Eyes: Negative for blurred vision.  Respiratory: Negative for cough and shortness of breath.   Cardiovascular: Positive for leg swelling. Negative for chest pain and palpitations.  Gastrointestinal: Positive for abdominal pain. Negative for constipation, diarrhea, nausea and vomiting.  Genitourinary: Positive for frequency. Negative for dysuria.  Skin: Negative for rash.  Neurological: Positive for dizziness.    Patient Active Problem List   Diagnosis Date Noted  . DKA (diabetic ketoacidoses) ([place]) 01/11/2017  . Persistent cough 11/16/2016  . Weight loss 04/09/2016  . Headache 04/09/2016  . Fibroids 04/14/2014   . Abnormal uterine bleeding (AUB) 04/14/2014  . Unequal leg length 04/16/2013  . Lightheadedness 12/17/2012  . Chest pain 12/14/2012  . Morbid obesity ([place]) 12/29/2011  . High cholesterol   . 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 ANXIETY 02/20/2010  . GERD 01/30/2010  . THYROID NODULE 01/17/2010  . THYROMEGALY 01/16/2010  . DEPRESSION 01/16/2010  . Type II diabetes mellitus ([place]) 09/18/2007  . Essential hypertension, benign 09/18/2007    Past Medical History: Past Medical History:  Diagnosis Date  . Diabetes mellitus   . Environmental allergies   . Fibroid   . High cholesterol   . Hx of cardiovascular stress test    a. ETT-MV 2/14:  EF 46% (normal by echo), low risk, no ischemia or scar  . Hx of echocardiogram    Echo 2/14: mild LVH, EF 55-60%, Gr 1 diast dysfn, mild LAE  . Hypertension   . Hyperthyroidism    Radioactive iodine ablation    Past Surgical History: Past Surgical History:  Procedure Laterality Date  . [place], 2001   x 2    Social History: Social History  Substance Use Topics  . Smoking status: Never Smoker  . Smokeless tobacco: Never Used  . Alcohol use No  Comment: occasional for special occasions   Additional social history: lives with her children (4). No alcohol. No drugs.   Please also refer to relevant sections of EMR.  Family History: Family History  Problem Relation Age of Onset  . Pneumonia Mother   . Diabetes Mother   . Hypertension Father   . Hypertension Sister   . Hyperlipidemia Sister   . Hypertension Brother   . Hypertension Maternal Aunt   . Hypertension Paternal Aunt   . Cancer Paternal Aunt     throat cancer  . Diabetes Maternal Grandmother   . Diabetes Paternal Grandmother   . Cancer Paternal Grandmother     pancreatic cancer    Allergies and Medications: No Known Allergies No current facility-administered medications on file prior to encounter.    Current Outpatient Prescriptions on File  Prior to Encounter  Medication Sig Dispense Refill  . amLODipine (NORVASC) 10 MG tablet Take 1 tablet (10 mg total) by mouth daily. 90 tablet 3  . aspirin EC 81 MG tablet Take 81 mg by mouth daily.    . cloNIDine (CATAPRES) 0.3 MG tablet TAKE 1 TABLET BY MOUTH TWICE A DAY 60 tablet 0  . cromolyn (OPTICROM) 4 % ophthalmic solution Place 1 drop into both eyes 2 (two) times daily as needed. EYE ALLERGIES    . diclofenac sodium (VOLTAREN) 1 % GEL Apply 2 g topically 4 (four) times daily. (Patient taking differently: Apply 2 g topically daily as needed (pain). ) 100 g 2  . gabapentin (NEURONTIN) 300 MG capsule Take 1 capsule (300 mg total) by mouth 3 (three) times daily. (Patient taking differently: Take 300 mg by mouth 3 (three) times daily as needed (nerve pain). ) 90 capsule 0  . glipiZIDE (GLUCOTROL) 10 MG tablet Take 1 tablet (10 mg total) by mouth 2 (two) times daily before a meal. 180 tablet 2  . hydrochlorothiazide (HYDRODIURIL) 25 MG tablet TAKE 1 TABLET (25 MG TOTAL) BY MOUTH DAILY. 90 tablet 2  . ibuprofen (ADVIL,MOTRIN) 800 MG tablet Take 1 tablet (800 mg total) by mouth 3 (three) times daily with meals as needed for headache or moderate pain. 30 tablet 3  . losartan (COZAAR) 25 MG tablet Take 1 tablet (25 mg total) by mouth daily. 30 tablet 2  . megestrol (MEGACE) 40 MG tablet Take 2 tablets (80 mg total) by mouth 2 (two) times daily. Can increase to 3 tablets twice a day for heavy bleeding 120 tablet 5  . metFORMIN (GLUCOPHAGE) 1000 MG tablet Take 1 tablet (1,000 mg total) by mouth 2 (two) times daily with a meal. 180 tablet 3  . metoprolol succinate (TOPROL XL) 50 MG 24 hr tablet Take 1 tablet (50 mg total) by mouth daily. Take with or immediately following a meal. 30 tablet 6  . benzonatate (TESSALON) 100 MG capsule Take 1 capsule (100 mg total) by mouth 2 (two) times daily as needed for cough. (Patient not taking: Reported on 01/11/2017) 20 capsule 0  . [DISCONTINUED] sertraline (ZOLOFT) 50  MG tablet Take 50 mg by mouth daily.      Objective: BP 142/79   Pulse 67   SpO2 99%  Exam: General: Obese female lying in bed in NAD.  Eyes: EMM, PEERLA ENTM: MMM, o/p normal Neck: supple, no JVD Cardiovascular: RRR, no murmur, no edema. Respiratory: CTAB, easy WOB, no wheezes Gastrointestinal: +BS, abdomen soft and nondistended, Obese, diffusely tender to palpation mostly over LLQ, no rebound or guarding.  MSK: moves all extremities spontaneously,  warm and well perfused Derm: no rashes on exposed skin Neuro: CN II-XII grossly intact, strength and sensation appropriate in all limbs Psych: mood and affect appropriate  Labs and Imaging: CBC BMET   Recent Labs Lab 01/11/17 0847  WBC 13.3*  HGB 11.5*  HCT 33.2*  PLT 357    Recent Labs Lab 01/11/17 1806  NA 136  K 3.2*  CL 104  CO2 23  BUN 13  CREATININE 1.08*  GLUCOSE 299*  CALCIUM 10.4*      Sela Hilding, MD 01/11/2017, 7:46 PM PGY-1, [place] Intern pager: [phone], text pages welcome  I have seen and evaluated the patient with Dr. Lindell Noe. I am in agreement with the note above in its revised form. My additions are in red.  Wendee Beavers, MD, PGY-2 01/11/2017 10:51 PM

## 2017-01-12 DIAGNOSIS — E860 Dehydration: Secondary | ICD-10-CM

## 2017-01-12 LAB — GLUCOSE, CAPILLARY
GLUCOSE-CAPILLARY: 229 mg/dL — AB (ref 65–99)
GLUCOSE-CAPILLARY: 235 mg/dL — AB (ref 65–99)
Glucose-Capillary: 225 mg/dL — ABNORMAL HIGH (ref 65–99)
Glucose-Capillary: 257 mg/dL — ABNORMAL HIGH (ref 65–99)

## 2017-01-12 LAB — CBC
HEMATOCRIT: 30.7 % — AB (ref 36.0–46.0)
HEMOGLOBIN: 9.9 g/dL — AB (ref 12.0–15.0)
MCH: 24 pg — ABNORMAL LOW (ref 26.0–34.0)
MCHC: 32.2 g/dL (ref 30.0–36.0)
MCV: 74.5 fL — AB (ref 78.0–100.0)
Platelets: 361 10*3/uL (ref 150–400)
RBC: 4.12 MIL/uL (ref 3.87–5.11)
RDW: 15.5 % (ref 11.5–15.5)
WBC: 11.4 10*3/uL — ABNORMAL HIGH (ref 4.0–10.5)

## 2017-01-12 LAB — FERRITIN: FERRITIN: 67 ng/mL (ref 11–307)

## 2017-01-12 LAB — IRON AND TIBC
IRON: 8 ug/dL — AB (ref 28–170)
SATURATION RATIOS: 2 % — AB (ref 10.4–31.8)
TIBC: 399 ug/dL (ref 250–450)
UIBC: 391 ug/dL

## 2017-01-12 LAB — BASIC METABOLIC PANEL
ANION GAP: 14 (ref 5–15)
Anion gap: 8 (ref 5–15)
BUN: 10 mg/dL (ref 6–20)
BUN: 9 mg/dL (ref 6–20)
CALCIUM: 10.5 mg/dL — AB (ref 8.9–10.3)
CHLORIDE: 106 mmol/L (ref 101–111)
CO2: 20 mmol/L — ABNORMAL LOW (ref 22–32)
CO2: 21 mmol/L — AB (ref 22–32)
CREATININE: 1.01 mg/dL — AB (ref 0.44–1.00)
Calcium: 10.6 mg/dL — ABNORMAL HIGH (ref 8.9–10.3)
Chloride: 103 mmol/L (ref 101–111)
Creatinine, Ser: 0.97 mg/dL (ref 0.44–1.00)
GFR calc Af Amer: >60 mL/min (ref >60)
GFR calc non Af Amer: >60 mL/min (ref >60)
Glucose, Bld: 201 mg/dL — ABNORMAL HIGH (ref 65–99)
Glucose, Bld: 246 mg/dL — ABNORMAL HIGH (ref 65–99)
POTASSIUM: 3.5 mmol/L (ref 3.5–5.1)
Potassium: 3.7 mmol/L (ref 3.5–5.1)
SODIUM: 135 mmol/L (ref 135–145)
Sodium: 137 mmol/L (ref 135–145)

## 2017-01-12 LAB — MRSA PCR SCREENING: MRSA by PCR: NEGATIVE

## 2017-01-12 LAB — RETICULOCYTES
RBC.: 4.1 MIL/uL (ref 3.87–5.11)
RETIC CT PCT: 1.2 % (ref 0.4–3.1)
Retic Count, Absolute: 49.2 10*3/uL (ref 19.0–186.0)

## 2017-01-12 LAB — FOLATE: Folate: 13.3 ng/mL (ref >5.9)

## 2017-01-12 LAB — VITAMIN B12: VITAMIN B 12: 403 pg/mL (ref 180–914)

## 2017-01-12 LAB — HIV ANTIBODY (ROUTINE TESTING W REFLEX): HIV SCREEN 4TH GENERATION: NONREACTIVE

## 2017-01-12 MED ORDER — DOCUSATE SODIUM 100 MG PO CAPS
100.0000 mg | ORAL_CAPSULE | Freq: Two times a day (BID) | ORAL | Status: DC
  Administered 2017-01-12 – 2017-01-15 (×7): 100 mg via ORAL
  Filled 2017-01-12 (×7): qty 1

## 2017-01-12 MED ORDER — MEGESTROL ACETATE 40 MG PO TABS
80.0000 mg | ORAL_TABLET | Freq: Two times a day (BID) | ORAL | Status: DC
  Administered 2017-01-12 – 2017-01-15 (×7): 80 mg via ORAL
  Filled 2017-01-12 (×9): qty 2

## 2017-01-12 MED ORDER — LIVING WELL WITH DIABETES BOOK
Freq: Once | Status: AC
  Administered 2017-01-12: 1
  Filled 2017-01-12: qty 1

## 2017-01-12 MED ORDER — INSULIN STARTER KIT- PEN NEEDLES (ENGLISH)
1.0000 | Freq: Once | Status: AC
  Administered 2017-01-12: 1
  Filled 2017-01-12: qty 1

## 2017-01-12 MED ORDER — METOPROLOL TARTRATE 25 MG PO TABS
25.0000 mg | ORAL_TABLET | Freq: Two times a day (BID) | ORAL | Status: DC
  Administered 2017-01-12 – 2017-01-15 (×7): 25 mg via ORAL
  Filled 2017-01-12 (×7): qty 1

## 2017-01-12 MED ORDER — METFORMIN HCL 500 MG PO TABS
1000.0000 mg | ORAL_TABLET | Freq: Two times a day (BID) | ORAL | Status: DC
  Administered 2017-01-12: 1000 mg via ORAL
  Filled 2017-01-12 (×2): qty 2

## 2017-01-12 MED ORDER — POLYETHYLENE GLYCOL 3350 17 G PO PACK
17.0000 g | PACK | Freq: Two times a day (BID) | ORAL | Status: DC
  Administered 2017-01-12 – 2017-01-15 (×6): 17 g via ORAL
  Filled 2017-01-12 (×7): qty 1

## 2017-01-12 MED ORDER — GABAPENTIN 300 MG PO CAPS
300.0000 mg | ORAL_CAPSULE | Freq: Three times a day (TID) | ORAL | Status: DC
  Administered 2017-01-12 – 2017-01-15 (×10): 300 mg via ORAL
  Filled 2017-01-12 (×11): qty 1

## 2017-01-12 MED ORDER — FERUMOXYTOL INJECTION 510 MG/17 ML
510.0000 mg | INTRAVENOUS | Status: DC
  Administered 2017-01-12: 510 mg via INTRAVENOUS
  Filled 2017-01-12: qty 17

## 2017-01-12 NOTE — Progress Notes (Signed)
Family Medicine Teaching Service Daily Progress Note Intern Pager: [phone]  Patient name: Andrea [name] Medical record number: [id number] Date of birth: 30-Nov-1960 Age: 56 y.o. Gender: female  Primary Care Provider: Lavon Paganini, MD Consultants: none Code Status: full  Pt Overview and Major Events to Date:  3/9 admitted with DKA.  Assessment and Plan:  Andrea [name] is a 56 y.o. female presenting with abdominal pain and lethargy. PMH is significant for T2DM (last HgbA1c 10.8 04/2016), morbid obesity, anxiety, HTN, GERD, uterine fibroids, and hyperthyroidism s/p radioactive ablation.   DKA/DM-2: DKA resolved. Transition to subcutaneous insulin. Her AG widened up a little bit to 14 on last BMP likely because she wasn't fed when she was transitioned. Last A1c 10.8 about 6 months ago.  DKA likely due to poor compliance with medication.  - Lantus 15 units nightly - NovoLog 4 units with meals + SSI - Carb modified diet - BMP this am then daily - Resume metformin this morning - Follow up A1c - Consult for diabetic coordinator  Abdominal pain: Improved. Partly due to DKA. She also have uterine fibroid. CT abdomen with uterine fibroid, diverticula and small cholelithiasis. Doubt the later is contributing to her pain. LFT within normal limit.  On exam, diffuse tenderness to palpation, worse over LLQ, no guarding or rebound. No signs and symptoms of infection - Continue to monitor - Tylenol when necessary -  K pad - Will avoid NSAID while bleeding  HTN: Normotensive. Supposedly on norvasc 10mg , clonidine 0.3 BID, HCTZ 25mg , cozaar 25mg , toprol XL 50mg .  - Continue clonidine and metoprolol - We'll resume other home meds as tolerated -  Monitor BP  AUB: Likely due to fibroid. TVUS on 10/17/2016 with 2 fibroid masses measuring 6.1 cm and 2.7 cm. Bleeding at baseline per patient. Followed by gynecology outpatient. On megace. She is hemodynamically stable. Hemoglobin trended down  from 11.5 to 9.5.  -Increased Megace to 80 mg twice a day. -Daily CBC -Anemia panel  Unintentional weight loss: Lost about 11 lbs over the last 9 months. S/p radioactive thyroid ablation. TSH 0.827 but free T4 1.16. Patient has no constitutional symptoms except the weight loss to think of malignancy or HIV infection. - Endocrine referral outpatient for mildy elevated free T4 - Recommend age-appropriate screening as outpatient - Follow up on HIV  Peripheral neuropathy: Patient recently started on gabapentin by EDP on 2/21.  - Resume home gabapentin  Moderate hematuria and mild proteinuria -Outpatient follow-up  Depression/anxiety: Stable. Not on any medication. Denies suicidal or homicidal ideation. - continue to monitor  FEN/GI:  -KVO -Carb modified diets  Prophylaxis: lovenox  Disposition:  transfer to regular floor if no worsening acidosis/anion gap this morning   Subjective:  No major complaints this morning except abdominal pain. She was not given a Kpad yet. Tylenol helped. Denies chest pain, shortness of breath or dizziness. Vaginal bleeding at baseline.   Objective: Temp:  [97.8 F (36.6 C)-98.8 F (37.1 C)] 97.8 F (36.6 C) (03/10 0719) Pulse Rate:  [61-85] 68 (03/10 0320) Resp:  [18-29] 29 (03/10 0719) BP: (122-154)/(63-94) 148/84 (03/10 0719) SpO2:  [95 %-100 %] 98 % (03/10 0320) Weight:  [264 lb (119.7 kg)] 264 lb (119.7 kg) (03/09 2117) Physical Exam: General: Obese female lying in bed in NAD.  Eyes: Conjunctivae mildly pale. Sclerae anicteric Cardiovascular: RRR, no murmur, no edema. Respiratory: CTAB, easy WOB, no wheezes Gastrointestinal: Obese, bowel sounds present and normal. Diffusely tender to palpation mostly over LLQ, no rebound or  guarding.  MSK: moves all extremities spontaneously, warm and well perfused Derm: no rashes on exposed skin Neuro: Alert and oriented appropriately. No gross neuro deficits. Psych: mood and affect appropriate.  Denies SI/HI Wt Readings from Last 10 Encounters:  01/11/17 264 lb (119.7 kg)  11/26/16 273 lb 12.8 oz (124.2 kg)  11/16/16 276 lb (125.2 kg)  11/02/16 277 lb (125.6 kg)  10/22/16 279 lb 12.8 oz (126.9 kg)  06/01/16 279 lb (126.6 kg)  04/09/16 275 lb 12.8 oz (125.1 kg)  09/22/15 (!) 306 lb (138.8 kg)  05/02/15 (!) 306 lb (138.8 kg)  05/11/14 299 lb (135.6 kg)   Laboratory:  Recent Labs Lab 01/11/17 0847 01/11/17 2140  WBC 13.3* 12.0*  HGB 11.5* 9.5*  HCT 33.2* 29.6*  PLT 357 329    Recent Labs Lab 01/11/17 1809 01/11/17 2140 01/11/17 2219 01/12/17 0243  NA  --  138 137 137  K  --  3.0* 3.6 3.7  CL  --  106 106 103  CO2  --  23 23 20*  BUN  --  11 11 10   CREATININE  --  1.02* 1.04* 1.01*  CALCIUM  --  10.5* 10.5* 10.5*  PROT 7.4  --   --   --   BILITOT 0.6  --   --   --   ALKPHOS 72  --   --   --   ALT 12*  --   --   --   AST 11*  --   --   --   GLUCOSE  --  140* 142* 201*    Imaging/Diagnostic Tests: Ct Abdomen Pelvis W Contrast  Result Date: 01/11/2017 CLINICAL DATA:  Left lower quadrant abdominal pain. EXAM: CT ABDOMEN AND PELVIS WITH CONTRAST TECHNIQUE: Multidetector CT imaging of the abdomen and pelvis was performed using the standard protocol following bolus administration of intravenous contrast. CONTRAST:  100 mL of Isovue-300 intravenously. COMPARISON:  None. FINDINGS: Lower chest: No acute abnormality. Hepatobiliary: Mild cholelithiasis is noted. No focal abnormality is noted in the liver. Pancreas: Unremarkable. No pancreatic ductal dilatation or surrounding inflammatory changes. Spleen: Normal in size without focal abnormality. Adrenals/Urinary Tract: Adrenal glands appear normal. Bilateral simple renal cysts are noted. No hydronephrosis or renal obstruction is noted. No renal or ureteral calculi are noted. Urinary bladder is unremarkable. Stomach/Bowel: The appendix appears normal. There is no evidence of bowel obstruction. Diverticulosis of descending and  sigmoid colon is noted without inflammation. Vascular/Lymphatic: No significant vascular findings are present. No enlarged abdominal or pelvic lymph nodes. Reproductive: Enlarged fibroid uterus is noted. Ovaries are unremarkable. Other: No abdominal wall hernia or abnormality. No abdominopelvic ascites. Musculoskeletal: No acute or significant osseous findings. IMPRESSION: Mild cholelithiasis without evidence of inflammation. Diverticulosis of descending and sigmoid colon without inflammation. Enlarged fibroid uterus. No other abnormality seen in the abdomen or pelvis. Electronically Signed   By: Marijo Conception, M.D.   On: 01/11/2017 12:40    Mercy Riding, MD 01/12/2017, 8:47 AM PGY-2, [place] Intern pager: [phone], text pages welcome

## 2017-01-12 NOTE — Progress Notes (Addendum)
Inpatient Diabetes Program Recommendations  AACE/ADA: New Consensus Statement on Inpatient Glycemic Control (2015)  Target Ranges:  Prepandial:   less than 140 mg/dL      Peak postprandial:   less than 180 mg/dL (1-2 hours)      Critically ill patients:  140 - 180 mg/dL   Lab Results  Component Value Date   GLUCAP 225 (H) 01/12/2017   HGBA1C 10.8 04/09/2016    Review of Glycemic Control:  Results for ANNORA, GUDERIAN (MRN [id number]) as of 01/12/2017 08:51  Ref. Range 01/11/2017 14:27 01/11/2017 16:12 01/11/2017 17:41 01/11/2017 18:55 01/11/2017 21:14  Glucose-Capillary Latest Ref Range: 65 - 99 mg/dL 356 (H) 327 (H) 284 (H) 236 (H) 155 (H)    Diabetes history: Type 2 diabetes Outpatient Diabetes medications: Glucotrol 10 mg bid, Metformin 1000 mg bid-pt. Not taking for past 2 days Current orders for Inpatient glycemic control:  Novolog sensitive tid with meals and HS, Novolog 4 units tid with meals, Lantus 15 units q HS  Inpatient Diabetes Program Recommendations:    A1C pending however last A1C>10%.  Patient will likely need insulin at discharge.  Will order Living Well with diabetes booklet and insulin starter kit for patient.  May be able to do once daily basal insulin with oral medications.  Will call RN to discuss. Spoke with patient by phone to discuss.  She states that she was told in the past that she might need insulin but that she lost weight and her blood sugars improved.  Told patient that I have ordered insulin starter kit, videos, and booklet.  Will have bedside RN review insulin administration with patient in case it is needed at d/c.  Will follow up 01/13/17.  Thanks, Adah Perl, RN, BC-ADM Inpatient Diabetes Coordinator Pager [phone] (8a-5p)

## 2017-01-12 NOTE — Progress Notes (Signed)
Patient arrived on unit via bed from [address].  No family at bedside.  Telemetry placed per MD order and CMT notified.

## 2017-01-12 NOTE — Progress Notes (Signed)
Pt transferred to 4E24. Pt alert and oriented, vital signs stable. Pt transferred via bed with nursing staff. Temp: 97.8 F (36.6 C) (03/10 1207) Temp Source: Oral (03/10 1207) BP: 139/81 (03/10 1207) Pulse Rate: 72 (03/10 1207)  Wray Kearns 01/12/2017 4:04 PM

## 2017-01-12 NOTE — Progress Notes (Addendum)
FPTS Interim Progress Note  Went into patient's room to follow up on how her abdominal pain was doing. Patient notes her pain is improved. She was eating lunch without difficulty while Dr. Rosalyn Gess and myself were in the room. Abdominal exam showing some mild tenderness to palpation in left upper and lower quadrant, normal bowel sounds.  Abdominal pain: Assuming multifactorial:constipation, DKA, fibroids - Continue scheduled Miralax and colace - Will continue to follow up on abdominal pain and ensure she has a BM - If no BM by today, may consider enema   Abnormal Uterine Bleeding:  -Megace increased today - SCDs  Microcytic Anemia: Blood loss anemia from uterine bleeding and iron deficiency anemia based on anemia panel. Iron very low at 8 with a 2% saturation ratio - Will give IV iron- feraheme x2 - Will need oral ferrous sulfate TID after IV iron  - Recheck iron likely in outpatient follow up  Carlyle Dolly, MD 01/12/2017, 1:26 PM PGY-2, [place] Medicine Service pager [phone]

## 2017-01-12 NOTE — Progress Notes (Signed)
Initial pt education on insulin pen completed. Pt demonstrated correct use of insulin pen. Pt also was able to successfully give noon dose of insulin via syringe by herself. Pt verbalized feeling anxious about giving self shot. Once pt completed task pt stated she felt better about giving herself injections but would need more practice. Will continue to give education and allow pt to administer her insulin injections under nursing supervision.

## 2017-01-13 DIAGNOSIS — E872 Acidosis: Secondary | ICD-10-CM

## 2017-01-13 DIAGNOSIS — R739 Hyperglycemia, unspecified: Secondary | ICD-10-CM

## 2017-01-13 DIAGNOSIS — E8729 Other acidosis: Secondary | ICD-10-CM

## 2017-01-13 LAB — HEMOGLOBIN A1C
Hgb A1c MFr Bld: 12.8 % — ABNORMAL HIGH (ref 4.8–5.6)
MEAN PLASMA GLUCOSE: 321 mg/dL

## 2017-01-13 LAB — CBC
HCT: 28.5 % — ABNORMAL LOW (ref 36.0–46.0)
Hemoglobin: 9 g/dL — ABNORMAL LOW (ref 12.0–15.0)
MCH: 23.5 pg — ABNORMAL LOW (ref 26.0–34.0)
MCHC: 31.6 g/dL (ref 30.0–36.0)
MCV: 74.4 fL — AB (ref 78.0–100.0)
PLATELETS: 332 10*3/uL (ref 150–400)
RBC: 3.83 MIL/uL — AB (ref 3.87–5.11)
RDW: 15.4 % (ref 11.5–15.5)
WBC: 9.3 10*3/uL (ref 4.0–10.5)

## 2017-01-13 LAB — GLUCOSE, CAPILLARY
GLUCOSE-CAPILLARY: 221 mg/dL — AB (ref 65–99)
GLUCOSE-CAPILLARY: 256 mg/dL — AB (ref 65–99)
GLUCOSE-CAPILLARY: 247 mg/dL — AB (ref 65–99)
GLUCOSE-CAPILLARY: 306 mg/dL — AB (ref 65–99)

## 2017-01-13 MED ORDER — TRAMADOL HCL 50 MG PO TABS
50.0000 mg | ORAL_TABLET | Freq: Two times a day (BID) | ORAL | Status: DC | PRN
  Filled 2017-01-13: qty 1

## 2017-01-13 MED ORDER — LOSARTAN POTASSIUM 25 MG PO TABS
25.0000 mg | ORAL_TABLET | Freq: Every day | ORAL | Status: DC
  Administered 2017-01-13 – 2017-01-15 (×3): 25 mg via ORAL
  Filled 2017-01-13 (×3): qty 1

## 2017-01-13 MED ORDER — SODIUM CHLORIDE 0.9 % IV SOLN: INTRAVENOUS | Status: DC

## 2017-01-13 MED ORDER — INSULIN GLARGINE 100 UNIT/ML ~~LOC~~ SOLN
18.0000 [IU] | Freq: Every day | SUBCUTANEOUS | Status: DC
  Administered 2017-01-13 – 2017-01-14 (×2): 18 [IU] via SUBCUTANEOUS
  Filled 2017-01-13 (×2): qty 0.18

## 2017-01-13 NOTE — Progress Notes (Signed)
Family Medicine Teaching Service Daily Progress Note Intern Pager: [phone]  Patient name: Andrea [name] Medical record number: [id number] Date of birth: 1960/12/20 Age: 56 y.o. Gender: female  Primary Care Provider: Lavon Paganini, MD Consultants: none Code Status: full  Pt Overview and Major Events to Date:  3/9 Admitted with DKA. 3/9 Transitioned off insulin gtt  Assessment and Plan: Andrea [name] is a 56 y.o. female presenting with abdominal pain and lethargy. PMH is significant for T2DM (last HgbA1c 10.8 04/2016), morbid obesity, anxiety, HTN, GERD, uterine fibroids, and hyperthyroidism s/p radioactive ablation.   DKA in the setting of T2DM: DKA resolved. Likely 2/2 poor compliance with meds. Last A1c 10.8 on 04/09/16. On Metformin and Glipizide at home. CBGs remain elevated in the 200s. - Increase Lantus from 15 units to 18 units qhs - NovoLog 4 units with meals + sSSI - Carb modified diet - Follow up A1c - Continue diabetic education - d/c cardiac monitoring  Abdominal pain: Improved. May have been 2/2 DKA vs uterine fibroid. - Continue to monitor - Tylenol prn - K pad - Will avoid NSAID due to active vaginal bleeding  HTN: Mildly hypertensive this morning. Supposedly on norvasc 10mg , clonidine 0.3 BID, HCTZ 25mg , cozaar 25mg , toprol XL 50mg . Cr back to baseline. - Continue clonidine and metoprolol - Restart Cozaar today. - Restart other home meds as tolerated - Monitor BP  AUB: Likely due to fibroids seen on TVUS 10/17/2016. Followed by gyn as outpt and on megace at home. Hgb 11.5 on admission, down to 9.0 today. Had bright red spotting overnight. Dilution with IVFs may be contributing.  - Continue Megace to 80 mg twice a day. - s/p Feraheme 3/10 - Daily CBC  Unintentional weight loss: Lost about 11 lbs over the last 9 months. S/p radioactive thyroid ablation. TSH 0.827 but free T4 1.16. Patient has no constitutional symptoms except the weight loss to think  of malignancy or HIV infection. HIV neg. - Endocrine referral outpatient for mildy elevated free T4 - Recommend age-appropriate screening as outpatient  Peripheral neuropathy: Patient recently started on gabapentin by EDP on 2/21.  - Resume home gabapentin  Moderate hematuria and mild proteinuria -Outpatient follow-up -Restart Cozaar today for renal protection  Depression/anxiety: Stable. Not on any medication. Denies suicidal or homicidal ideation. - continue to monitor  FEN/GI:  -Stop IVFs -Carb modified diet  Prophylaxis: SCDs with active vaginal bleeding.  Disposition:  Will need to monitor Hgb. Also needs continued education on insulin administration. Possible discharge home 3/12.  Subjective:  Pt states she is still having abdominal pain this morning, but it is a little better. No vomiting.  Objective: Temp:  [97.8 F (36.6 C)-99.4 F (37.4 C)] 99.4 F (37.4 C) (03/11 0501) Pulse Rate:  [72-81] 74 (03/11 0501) Resp:  [18-20] 18 (03/11 0501) BP: (137-164)/(77-90) 158/86 (03/11 0501) SpO2:  [99 %-100 %] 100 % (03/11 0501) Weight:  [267 lb 8 oz (121.3 kg)-268 lb 8 oz (121.8 kg)] 267 lb 8 oz (121.3 kg) (03/10 2115) Physical Exam: General: Laying in bed, in NAD HEENT: [place]/AT, EOMI, MMM Cardiovascular: RRR, no murmurs Respiratory: Normal work of breathing, CTAB Gastrointestinal: Moderate diffuse tenderness to palpation, +BS, soft, no rebound/guarding, no peritoneal signs. MSK: No edema  Wt Readings from Last 10 Encounters:  01/12/17 267 lb 8 oz (121.3 kg)  11/26/16 273 lb 12.8 oz (124.2 kg)  11/16/16 276 lb (125.2 kg)  11/02/16 277 lb (125.6 kg)  10/22/16 279 lb 12.8 oz (126.9 kg)  06/01/16 279 lb (126.6 kg)  04/09/16 275 lb 12.8 oz (125.1 kg)  09/22/15 (!) 306 lb (138.8 kg)  05/02/15 (!) 306 lb (138.8 kg)  05/11/14 299 lb (135.6 kg)   Laboratory:  Recent Labs Lab 01/11/17 2140 01/12/17 0916 01/13/17 0635  WBC 12.0* 11.4* 9.3  HGB 9.5* 9.9* 9.0*   HCT 29.6* 30.7* 28.5*  PLT 329 361 332    Recent Labs Lab 01/11/17 1809  01/11/17 2219 01/12/17 0243 01/12/17 0916  NA  --   < > 137 137 135  K  --   < > 3.6 3.7 3.5  CL  --   < > 106 103 106  CO2  --   < > 23 20* 21*  BUN  --   < > 11 10 9   CREATININE  --   < > 1.04* 1.01* 0.97  CALCIUM  --   < > 10.5* 10.5* 10.6*  PROT 7.4  --   --   --   --   BILITOT 0.6  --   --   --   --   ALKPHOS 72  --   --   --   --   ALT 12*  --   --   --   --   AST 11*  --   --   --   --   GLUCOSE  --   < > 142* 201* 246*  < > = values in this interval not displayed.  Imaging/Diagnostic Tests: No results found.  Sela Hua, MD 01/13/2017, 9:21 AM PGY-2, [place] Intern pager: [phone], text pages welcome

## 2017-01-13 NOTE — Progress Notes (Signed)
Still complaining of mild, 3/10, abdominal discomfort with movement. Last bowel movement 3/09.  Stool softeners administered.  Continues to have red vaginal spotting without clots.  3 pads used through the night with 25% of pad saturated.

## 2017-01-14 ENCOUNTER — Inpatient Hospital Stay (HOSPITAL_COMMUNITY): Payer: Medicaid Other

## 2017-01-14 DIAGNOSIS — D259 Leiomyoma of uterus, unspecified: Secondary | ICD-10-CM

## 2017-01-14 DIAGNOSIS — R109 Unspecified abdominal pain: Secondary | ICD-10-CM

## 2017-01-14 DIAGNOSIS — R1084 Generalized abdominal pain: Secondary | ICD-10-CM

## 2017-01-14 LAB — GLUCOSE, CAPILLARY
GLUCOSE-CAPILLARY: 226 mg/dL — AB (ref 65–99)
GLUCOSE-CAPILLARY: 310 mg/dL — AB (ref 65–99)
Glucose-Capillary: 286 mg/dL — ABNORMAL HIGH (ref 65–99)
Glucose-Capillary: 259 mg/dL — ABNORMAL HIGH (ref 65–99)

## 2017-01-14 LAB — CBC
HEMATOCRIT: 29 % — AB (ref 36.0–46.0)
Hemoglobin: 9.4 g/dL — ABNORMAL LOW (ref 12.0–15.0)
MCH: 24.1 pg — ABNORMAL LOW (ref 26.0–34.0)
MCHC: 32.4 g/dL (ref 30.0–36.0)
MCV: 74.4 fL — ABNORMAL LOW (ref 78.0–100.0)
Platelets: 373 10*3/uL (ref 150–400)
RBC: 3.9 MIL/uL (ref 3.87–5.11)
RDW: 15.8 % — AB (ref 11.5–15.5)
WBC: 9 10*3/uL (ref 4.0–10.5)

## 2017-01-14 LAB — BASIC METABOLIC PANEL
Anion gap: 8 (ref 5–15)
BUN: 9 mg/dL (ref 6–20)
CALCIUM: 10.9 mg/dL — AB (ref 8.9–10.3)
CHLORIDE: 108 mmol/L (ref 101–111)
CO2: 20 mmol/L — AB (ref 22–32)
Creatinine, Ser: 0.94 mg/dL (ref 0.44–1.00)
GFR calc non Af Amer: >60 mL/min (ref >60)
Glucose, Bld: 222 mg/dL — ABNORMAL HIGH (ref 65–99)
Potassium: 3.5 mmol/L (ref 3.5–5.1)
SODIUM: 136 mmol/L (ref 135–145)

## 2017-01-14 LAB — HEPATIC FUNCTION PANEL
ALBUMIN: 2.5 g/dL — AB (ref 3.5–5.0)
ALK PHOS: 87 U/L (ref 38–126)
ALT: 11 U/L — AB (ref 14–54)
AST: 12 U/L — ABNORMAL LOW (ref 15–41)
Bilirubin, Direct: <0.1 mg/dL — ABNORMAL LOW (ref 0.1–0.5)
TOTAL PROTEIN: 7.5 g/dL (ref 6.5–8.1)
Total Bilirubin: 0.6 mg/dL (ref 0.3–1.2)

## 2017-01-14 LAB — T3, FREE: T3, Free: 1.7 pg/mL — ABNORMAL LOW (ref 2.0–4.4)

## 2017-01-14 LAB — LIPASE, BLOOD: LIPASE: 17 U/L (ref 11–51)

## 2017-01-14 MED ORDER — INSULIN STARTER KIT- SYRINGES (ENGLISH)
1.0000 | Freq: Once | Status: AC
  Administered 2017-01-14: 1
  Filled 2017-01-14: qty 1

## 2017-01-14 NOTE — Progress Notes (Signed)
Inpatient Diabetes Program Recommendations  AACE/ADA: New Consensus Statement on Inpatient Glycemic Control (2015)  Target Ranges:  Prepandial:   less than 140 mg/dL      Peak postprandial:   less than 180 mg/dL (1-2 hours)      Critically ill patients:  140 - 180 mg/dL   Spoke with patient about diet and diet selection for snacks (patient reports being a "big snacker") patient also reported liking sweet tea to drink. Spoke with patient about sugar substitutes and exercise. Carb counting is too complex for patient. Discussed the plate method.  Patient is interested in our DM educational videos  501-Basic skills for controlling diabetes 421-[id number] Long Term Complications 773-[id number] Your Diabetes 504-Introduction to Carb Counting 505-Diabetes: Foot Care 506-Diabetes: Insulin Injection 507-Diabetes & Nutrition: Eating for Health 508-Diabetes: Reducing Fears About Injections 509-Diabetes and Heart Disease  510-Monitoring Blood Glucose  511-How to use an insulin pen  Spoke with patient about checking her glucose at home to keep track of her trends while she is changing what she eats and moving more for possible medication adjustment needs.  Thanks,  Tama Headings RN, MSN, [facility] Inpatient Diabetes Coordinator Team Pager [phone] (8a-5p)

## 2017-01-14 NOTE — Progress Notes (Signed)
Family Medicine Teaching Service Daily Progress Note Intern Pager: [phone]  Patient name: Andrea [name] Medical record number: [id number] Date of birth: [birth year] Age: 56 y.o. Gender: female  Primary Care Provider: Lavon Paganini, MD Consultants: none Code Status: full  Pt Overview and Major Events to Date:  3/9 Admitted with DKA. 3/9 Transitioned off insulin gtt  Assessment and Plan: Andrea [name] is a 56 y.o. female presenting with abdominal pain and lethargy. PMH is significant for T2DM (last HgbA1c 10.8 04/2016), morbid obesity, anxiety, HTN, GERD, uterine fibroids, and hyperthyroidism s/p radioactive ablation.   DKA in the setting of T2DM:  DKA resolved. Likely 2/2 poor compliance with meds. Last A1c 10.8 on 04/09/16. On Metformin 1000 mg BID and Glipizide at home. CBGs remain elevated in the 200s. A1c 12.8%.   -Lantus was increased from 15 units to 18 units qhs .  CBGs have been elevated overnight to 200s.  Will continue to monitor and if needed will increase Lantus dose.  - NovoLog 4 units with meals + sSSI - Carb modified diet - Continue diabetic education.  Patient has been instructed how to inject her own insulin.    Abdominal pain:  May have been 2/2 DKA vs uterine fibroid. In setting of continued RUQ pain and CT abdomen with cholelithiasis, will evaluate with RUQ U/S and LFTs. Will also check lipase.    - Tylenol prn - K pad - Will avoid NSAID due to active vaginal bleeding  HTN: Mildly hypertensive to 145/88 this morning. Supposedly on norvasc 10mg , clonidine 0.3 BID, HCTZ 25mg , cozaar 25mg , toprol XL 50mg . Cr back to baseline. - Continue clonidine and metoprolol - Restart Cozaar today. - Restart other home meds as tolerated - Monitor BP  AUB: Likely due to fibroids seen on TVUS 10/17/2016. Followed by gyn as outpt and on megace at home. Hgb 11.5 on admission, down to 9.0 today. Had bright red spotting overnight. Dilution with IVFs may be contributing.  -  Continue Megace to 80 mg twice a day. - s/p Feraheme 3/10 - Daily CBC.  Hgb this AM stable at 9.4.   Unintentional weight loss: Lost about 11 lbs over the last 9 months. S/p radioactive thyroid ablation. TSH 0.827 but free T4 1.16. Patient has no constitutional symptoms except the weight loss to think of malignancy or HIV infection. HIV neg. - Endocrine referral outpatient for mildy elevated free T4 - Recommend age-appropriate screening as outpatient  Peripheral neuropathy: Patient recently started on gabapentin by EDP on 2/21.  - Resume home gabapentin  Moderate hematuria and mild proteinuria -Outpatient follow-up  -Restart Cozaar today for renal protection  Depression/anxiety: Stable. Not on any medication. Denies suicidal or homicidal ideation. - continue to monitor  FEN/GI:  -Stop IVFs -Carb modified diet  Prophylaxis: SCDs with active vaginal bleeding.  Disposition:  Will continue to monitor CBGs and determine insulin regimen for patient.  Also monitoring Hgb in setting of active bleed. Dispo pending clinical improvement.   Subjective:  Patient continues to have cramping abdominal pain in RUQ.  No other complaints at this time.    Objective: Temp:  [98.3 F (36.8 C)-98.7 F (37.1 C)] 98.3 F (36.8 C) (03/12 0910) Pulse Rate:  [64-70] 70 (03/12 0910) Resp:  [18] 18 (03/12 0910) BP: (145-152)/(85-88) 145/88 (03/12 0910) SpO2:  [97 %-98 %] 98 % (03/12 0910)   Physical Exam: General: 56 yo F laying in bed, in NAD  HEENT: NCAT, EOMI, MMM Cardiovascular: RRR, no murmurs Respiratory: Normal work of  breathing, CTAB Gastrointestinal: soft, RUQ tenderness on palpation, no guarding or rebound tenderness, +BS MSK: No edema Psych: normal mood and affect   Wt Readings from Last 10 Encounters:  01/12/17 267 lb 8 oz (121.3 kg)  11/26/16 273 lb 12.8 oz (124.2 kg)  11/16/16 276 lb (125.2 kg)  11/02/16 277 lb (125.6 kg)  10/22/16 279 lb 12.8 oz (126.9 kg)  06/01/16 279  lb (126.6 kg)  04/09/16 275 lb 12.8 oz (125.1 kg)  09/22/15 (!) 306 lb (138.8 kg)  05/02/15 (!) 306 lb (138.8 kg)  05/11/14 299 lb (135.6 kg)   Laboratory:  Recent Labs Lab 01/12/17 0916 01/13/17 0635 01/14/17 0653  WBC 11.4* 9.3 9.0  HGB 9.9* 9.0* 9.4*  HCT 30.7* 28.5* 29.0*  PLT 361 332 373    Recent Labs Lab 01/11/17 1809  01/12/17 0243 01/12/17 0916 01/14/17 0653 01/14/17 1238  NA  --   < > 137 135 136  --   K  --   < > 3.7 3.5 3.5  --   CL  --   < > 103 106 108  --   CO2  --   < > 20* 21* 20*  --   BUN  --   < > 10 9 9   --   CREATININE  --   < > 1.01* 0.97 0.94  --   CALCIUM  --   < > 10.5* 10.6* 10.9*  --   PROT 7.4  --   --   --   --  7.5  BILITOT 0.6  --   --   --   --  0.6  ALKPHOS 72  --   --   --   --  87  ALT 12*  --   --   --   --  11*  AST 11*  --   --   --   --  12*  GLUCOSE  --   < > 201* 246* 222*  --   < > = values in this interval not displayed.  Imaging/Diagnostic Tests: No results found.   Lovenia Kim, MD 01/14/2017, 3:37 PM PGY-1, [place] Intern pager: [phone], text pages welcome

## 2017-01-14 NOTE — Progress Notes (Signed)
Inpatient Diabetes Program Recommendations  AACE/ADA: New Consensus Statement on Inpatient Glycemic Control (2015)  Target Ranges:  Prepandial:   less than 140 mg/dL      Peak postprandial:   less than 180 mg/dL (1-2 hours)      Critically ill patients:  140 - 180 mg/dL   Results for Andrea [name], Andrea [name] (MRN [id number]) as of 01/14/2017 10:37  Ref. Range 01/13/2017 07:48 01/13/2017 12:24 01/13/2017 17:32 01/13/2017 20:53 01/14/2017 07:49  Glucose-Capillary Latest Ref Range: 65 - 99 mg/dL 221 (H) 256 (H) 247 (H) 306 (H) 226 (H)   Review of Glycemic Control  A1c 12.8 % this admission  Inpatient Diabetes Program Recommendations:   Glucose still elevated this am after Lantus 18 units. Considering how much Novolog patient had yesterday, please consider increasing Lantus to 24 units.  Thanks,  Tama Headings RN, MSN, [facility] Inpatient Diabetes Coordinator Team Pager [phone] (8a-5p)

## 2017-01-15 ENCOUNTER — Inpatient Hospital Stay: Payer: Medicaid Other | Admitting: Internal Medicine

## 2017-01-15 ENCOUNTER — Other Ambulatory Visit: Payer: Self-pay | Admitting: Family Medicine

## 2017-01-15 DIAGNOSIS — R109 Unspecified abdominal pain: Secondary | ICD-10-CM

## 2017-01-15 LAB — CBC
HEMATOCRIT: 27.1 % — AB (ref 36.0–46.0)
Hemoglobin: 8.6 g/dL — ABNORMAL LOW (ref 12.0–15.0)
MCH: 23.8 pg — ABNORMAL LOW (ref 26.0–34.0)
MCHC: 31.7 g/dL (ref 30.0–36.0)
MCV: 74.9 fL — ABNORMAL LOW (ref 78.0–100.0)
Platelets: 350 10*3/uL (ref 150–400)
RBC: 3.62 MIL/uL — ABNORMAL LOW (ref 3.87–5.11)
RDW: 15.8 % — AB (ref 11.5–15.5)
WBC: 7.9 10*3/uL (ref 4.0–10.5)

## 2017-01-15 LAB — BASIC METABOLIC PANEL
ANION GAP: 7 (ref 5–15)
BUN: 7 mg/dL (ref 6–20)
CALCIUM: 10.6 mg/dL — AB (ref 8.9–10.3)
CO2: 21 mmol/L — AB (ref 22–32)
Chloride: 109 mmol/L (ref 101–111)
Creatinine, Ser: 0.89 mg/dL (ref 0.44–1.00)
Glucose, Bld: 229 mg/dL — ABNORMAL HIGH (ref 65–99)
Potassium: 4 mmol/L (ref 3.5–5.1)
Sodium: 137 mmol/L (ref 135–145)

## 2017-01-15 LAB — GLUCOSE, CAPILLARY
GLUCOSE-CAPILLARY: 237 mg/dL — AB (ref 65–99)
Glucose-Capillary: 222 mg/dL — ABNORMAL HIGH (ref 65–99)

## 2017-01-15 MED ORDER — INSULIN GLARGINE 100 UNIT/ML ~~LOC~~ SOLN
25.0000 [IU] | Freq: Every day | SUBCUTANEOUS | Status: DC
  Filled 2017-01-15: qty 0.25

## 2017-01-15 MED ORDER — INSULIN GLARGINE 100 UNIT/ML ~~LOC~~ SOLN
25.0000 [IU] | Freq: Every day | SUBCUTANEOUS | 4 refills | Status: DC
Start: 2017-01-15 — End: 2017-01-29

## 2017-01-15 MED ORDER — AMLODIPINE BESYLATE 10 MG PO TABS
10.0000 mg | ORAL_TABLET | Freq: Every day | ORAL | Status: DC
  Administered 2017-01-15: 10 mg via ORAL
  Filled 2017-01-15: qty 1

## 2017-01-15 NOTE — Discharge Instructions (Signed)
You were admitted to the hospital for uncontrolled blood sugars.  You were put on an insulin drip to help bring your sugars down and once stable were transitioned to injectable insulin.  You were taught how to administer this insulin yourself and at discharge we have sent you home on Lantus 25 units daily as well as your regular Metformin 1000 mg twice a day.    Please STOP taking your Glipizide as this medication is no longer necessary.  Make sure to follow up with your PCP upon discharge.

## 2017-01-15 NOTE — Progress Notes (Signed)
PCP Social Note  Spoke to patient during hospitalization.  Reports that she is improving.  Appreciate excellent care provided by inpatient team, FPTS.  Look forward to seeing patient in follow-up.  Virginia Crews, MD, MPH PGY-3,  [facility] 01/15/2017 12:04 PM

## 2017-01-15 NOTE — Progress Notes (Signed)
Family Medicine Teaching Service Daily Progress Note Intern Pager: [phone]  Patient name: Andrea [name] Medical record number: [id number] Date of birth: 07/27/1961 Age: 56 y.o. Gender: female  Primary Care Provider: Lavon Paganini, MD Consultants: none Code Status: full  Pt Overview and Major Events to Date:  3/9 Admitted with DKA. 3/9 Transitioned off insulin gtt   Assessment and Plan: Andrea [name] is a 56 y.o. female presenting with abdominal pain and lethargy. PMH is significant for T2DM (last HgbA1c 10.8 04/2016), morbid obesity, anxiety, HTN, GERD, uterine fibroids, and hyperthyroidism s/p radioactive ablation.   DKA in the setting of T2DM:  DKA resolved. Likely 2/2 poor compliance with meds. Last A1c 10.8 on 04/09/16. On Metformin 1000 mg BID and Glipizide at home. CBGs remain elevated in the 200s. A1c 12.8%.   -Will discharge home with 25 U Lantus daily and Metformin 1000 mg BID.  Can discontinue Glipizide.   - Patient has received diabetic education   Abdominal pain, improved. :  May have been 2/2 DKA vs uterine fibroid. RUQ U/S with mildly enlarged CBD.  Also with concern of rectal bleeding but is likely vaginal bleeding.  Did not perform FOBT here as will likely be + given she has concurrent vaginal bleeding.  However can follow up outpatient with GI and perform colonoscopy as she is due for one.  Will make note in discharge summary for patient to obtain a GI referral.   HTN: Mildly hypertensive to 145/88 this morning. Supposedly on norvasc 10mg , clonidine 0.3 BID, HCTZ 25mg , cozaar 25mg , toprol XL 50mg . Cr back to baseline. - Continue clonidine and metoprolol, Cozaar  - Restart home Amlodipine  - Monitor BP  DUB: Likely due to fibroids seen on TVUS 10/17/2016. Followed by gyn as outpt and on megace at home. Hgb 11.5 on admission, down to 9.0 today. Dilution with IVFs may be contributing.   - Continue Megace to 80 mg twice a day. - s/p Feraheme 3/10 - Daily CBC.   Hgb this AM stable at 8.6.   Unintentional weight loss: Lost about 11 lbs over the last 9 months. S/p radioactive thyroid ablation. TSH 0.827 but free T4 1.16. Patient has no constitutional symptoms except the weight loss to think of malignancy or HIV infection. HIV neg. - Endocrine referral outpatient for mildy elevated free T4  - Recommend age-appropriate screening as outpatient   Peripheral neuropathy: Patient recently started on gabapentin by EDP on 2/21.  - Resume home gabapentin   Moderate hematuria and mild proteinuria -Outpatient follow-up  -Continue Cozaar   Depression/anxiety: Stable. Not on any medication. Denies suicidal or homicidal ideation. - continue to monitor  FEN/GI:  -Stop IVFs -Carb modified diet  Prophylaxis: SCDs with active vaginal bleeding.  Disposition:  Discharge home today.    Subjective:  Abdominal pain improved.  No complaints this morning.  She is eating and has not been vomiting or nauseous.    Objective: Temp:  [97.5 F (36.4 C)-98.7 F (37.1 C)] 97.6 F (36.4 C) (03/13 0807) Pulse Rate:  [60-75] 75 (03/13 0807) Resp:  [16-18] 16 (03/13 0807) BP: (129-161)/(64-94) 154/82 (03/13 1250) SpO2:  [97 %-98 %] 98 % (03/13 0807) Weight:  [268 lb 1.6 oz (121.6 kg)] 268 lb 1.6 oz (121.6 kg) (03/12 2252)   Physical Exam: General: 56 yo F laying in bed, in NAD  HEENT: NCAT, EOMI, MMM Cardiovascular: RRR, no murmurs Respiratory: Normal work of breathing, CTAB Gastrointestinal: soft, no guarding or rebound tenderness, +BS MSK: No edema Psych:  normal mood and affect   Wt Readings from Last 10 Encounters:  01/14/17 268 lb 1.6 oz (121.6 kg)  11/26/16 273 lb 12.8 oz (124.2 kg)  11/16/16 276 lb (125.2 kg)  11/02/16 277 lb (125.6 kg)  10/22/16 279 lb 12.8 oz (126.9 kg)  06/01/16 279 lb (126.6 kg)  04/09/16 275 lb 12.8 oz (125.1 kg)  09/22/15 (!) 306 lb (138.8 kg)  05/02/15 (!) 306 lb (138.8 kg)  05/11/14 299 lb (135.6 kg)    Laboratory:  Recent Labs Lab 01/13/17 0635 01/14/17 0653 01/15/17 0648  WBC 9.3 9.0 7.9  HGB 9.0* 9.4* 8.6*  HCT 28.5* 29.0* 27.1*  PLT 332 373 350    Recent Labs Lab 01/11/17 1809  01/12/17 0916 01/14/17 0653 01/14/17 1238 01/15/17 0648  NA  --   < > 135 136  --  137  K  --   < > 3.5 3.5  --  4.0  CL  --   < > 106 108  --  109  CO2  --   < > 21* 20*  --  21*  BUN  --   < > 9 9  --  7  CREATININE  --   < > 0.97 0.94  --  0.89  CALCIUM  --   < > 10.6* 10.9*  --  10.6*  PROT 7.4  --   --   --  7.5  --   BILITOT 0.6  --   --   --  0.6  --   ALKPHOS 72  --   --   --  87  --   ALT 12*  --   --   --  11*  --   AST 11*  --   --   --  12*  --   GLUCOSE  --   < > 246* 222*  --  229*  < > = values in this interval not displayed.  Imaging/Diagnostic Tests: US Abdomen Limited Ruq  Result Date: 01/14/2017 CLINICAL DATA:  Right upper quadrant pain EXAM: US ABDOMEN LIMITED - RIGHT UPPER QUADRANT COMPARISON:  CT 01/11/2017 FINDINGS: Gallbladder: Multiple calcified stones within the gallbladder lumen measuring up to 1 cm. Slight wall thickening at 4.4 mm. Sonographer reports negative sonographic Murphy's. Common bile duct: Diameter: Borderline to slightly enlarged at 6.9 mm. Liver: No focal lesion identified. Within normal limits in parenchymal echogenicity. Incidentally noted are cysts in the right kidney upper pole measuring 1.9 x 1.2 x 1.9 cm and 1.4 x 1.5 x 1.6 cm. No ascites. IMPRESSION: 1. Cholelithiasis with mild wall thickening but negative sonographic Murphy's. Common bile duct is borderline to slightly enlarged at 6.9 mm, correlate with laboratory values. 2. Right kidney upper pole cysts Electronically Signed   By: Donavan Foil M.D.   On: 01/14/2017 19:14    Andrea Kim, MD 01/15/2017, 3:21 PM PGY-1, [place] Intern pager: [phone], text pages welcome

## 2017-01-15 NOTE — Progress Notes (Signed)
Roda Shutters to be D/C'd Home per MD order.  Discussed prescriptions and follow up appointments with the patient. Prescriptions given to patient, medication list explained in detail. Pt verbalized understanding.  Allergies as of 01/15/2017   No Known Allergies     Medication List    STOP taking these medications   glipiZIDE 10 MG tablet Commonly known as:  GLUCOTROL     TAKE these medications   amLODipine 10 MG tablet Commonly known as:  NORVASC Take 1 tablet (10 mg total) by mouth daily.   aspirin EC 81 MG tablet Take 81 mg by mouth daily. Notes to patient:  1 dose   benzonatate 100 MG capsule Commonly known as:  TESSALON Take 1 capsule (100 mg total) by mouth 2 (two) times daily as needed for cough.   cloNIDine 0.3 MG tablet Commonly known as:  CATAPRES TAKE 1 TABLET BY MOUTH TWICE A DAY Notes to patient:  1 dose this evening.    cromolyn 4 % ophthalmic solution Commonly known as:  OPTICROM Place 1 drop into both eyes 2 (two) times daily as needed. EYE ALLERGIES   diclofenac sodium 1 % Gel Commonly known as:  VOLTAREN Apply 2 g topically 4 (four) times daily. What changed:  when to take this  reasons to take this   gabapentin 300 MG capsule Commonly known as:  NEURONTIN Take 1 capsule (300 mg total) by mouth 3 (three) times daily. What changed:  when to take this  reasons to take this Notes to patient:  1 dose this evening.    hydrochlorothiazide 25 MG tablet Commonly known as:  HYDRODIURIL TAKE 1 TABLET (25 MG TOTAL) BY MOUTH DAILY. Notes to patient:  1 dose.    ibuprofen 800 MG tablet Commonly known as:  ADVIL,MOTRIN Take 1 tablet (800 mg total) by mouth 3 (three) times daily with meals as needed for headache or moderate pain.   insulin glargine 100 UNIT/ML injection Commonly known as:  LANTUS Inject 0.25 mLs (25 Units total) into the skin at bedtime. Notes to patient:  1 dose.    losartan 25 MG tablet Commonly known as:  COZAAR Take 1  tablet (25 mg total) by mouth daily.   megestrol 40 MG tablet Commonly known as:  MEGACE Take 2 tablets (80 mg total) by mouth 2 (two) times daily. Can increase to 3 tablets twice a day for heavy bleeding Notes to patient:  1 dose.    metFORMIN 1000 MG tablet Commonly known as:  GLUCOPHAGE Take 1 tablet (1,000 mg total) by mouth 2 (two) times daily with a meal. Notes to patient:  1 dose.    metoprolol succinate 50 MG 24 hr tablet Commonly known as:  TOPROL XL Take 1 tablet (50 mg total) by mouth daily. Take with or immediately following a meal.       Vitals:   01/15/17 0807 01/15/17 1250  BP: (!) 145/85 (!) 154/82  Pulse: 75   Resp: 16   Temp: 97.6 F (36.4 C)     Skin clean, dry and intact without evidence of skin break down, no evidence of skin tears noted. IV catheter discontinued intact. Site without signs and symptoms of complications. Dressing and pressure applied. Pt denies pain at this time. No complaints noted.  An After Visit Summary was printed and given to the patient. Patient escorted via [place], and D/C home via private auto.  Dixie Dials RN, BSN

## 2017-01-16 ENCOUNTER — Telehealth: Payer: Self-pay | Admitting: *Deleted

## 2017-01-16 NOTE — Telephone Encounter (Signed)
[facility] Post-discharge Follow-Up Phone Call:  Date of Discharge: 01/15/2017 Principal Discharge Diagnosis(es): DKA Post-discharge Communication: Attempt #1 to reach patient and complete post-discharge follow-up phone call. Call placed to [phone]; unable to reach patient. VM is full, unable to leave message. Will make second attempt later. Call Completed: No                     Hubbard Hartshorn, RN, BSN

## 2017-01-16 NOTE — Discharge Summary (Signed)
[facility] Discharge Summary  Patient name: Andrea [name] Medical record number: [id number] Date of birth: 10/22/1961 Age: 56 y.o. Gender: female Date of Admission: 01/11/2017  Date of Discharge: 01/15/2017 Admitting Physician: Alveda Reasons, MD  Primary Care Provider: Lavon Paganini, MD Consultants: None  Indication for Hospitalization: Abdominal pain, DKA  Discharge Diagnoses/Problem List:  DKA/T2DM HTN Abnormal uterine bleeding Unintentional weight loss  Disposition: Home  Discharge Condition: Stable, improved  Discharge Exam:  General: 56 yo F laying in bed, in NAD  HEENT: NCAT, EOMI, MMM Cardiovascular: RRR, no murmurs Respiratory: Normal work of breathing, CTAB Gastrointestinal:soft, no guarding or rebound tenderness, +BS MSK: No edema Psych: normal mood and affect   Brief Hospital Course:   Andrea [name]  presented to the ED with abdominal pain and lethargy.  Prior to admission, last A1c was 10.8% about 6 months ago.  Had not been taking Metformin or glipizide for several days and is noncompliant.  She also endorsed unintentional weight loss.  On admission, white count 13.3 likely due to hemoconcentration and no signs or symptoms of infection.  UA with glucosuria, hemoglobinuria, mild proteinuria and mild ketones but no nitrites or leuks.  Likely cause of her DKA is poor controlled DM and medication non-adherence.  She received insulin and NS bolus in ED and was admitted to stepdown unit.  She was started on insulin drip and transitioned to D5 1/2 NS with K once sugars came down.   She was put on Lantus 15 U with Novolog sliding scale.    Patient also with known uterine fibroids which could also be contributing to abdominal pain.  She is followed outpatient by Gynecology.  She is on Megace and reports no changes in her bleeding.  Megace was continued on admission and dose was increase in setting of her hemoglobin drop from baseline of  11-12 to 9.  Also given Feraheme.   Hemoglobin at time of discharge was stable at 9.4.   Patient received diabetic teaching and instructed how to manage her own insulin.   She was discharged home on 25 units Lantus daily, Novolog SSI, and home Metformin 1000 mg BID.  Glipizide was discontinued on discharge.     Issues for Follow Up:  1. Stopped Glipizide on discharge. Please make sure patient is compliant with her new regimen. Her A1c on this admission was 12.8%.   2. Patient reported unintentional weight loss.  HIV negative. TSH normal but free T4 mildly elevated, consider endocrine referral.  3. Please follow up BP.  Patient was hypertensive on admission and higher doses of antihypertensives were administered.  4. Follow up moderate hematuria and mild proteinuria outpatient.  5. Patient will need a colonoscopy.   Significant Procedures: None  Significant Labs and Imaging:   Recent Labs Lab 01/13/17 0635 01/14/17 0653 01/15/17 0648  WBC 9.3 9.0 7.9  HGB 9.0* 9.4* 8.6*  HCT 28.5* 29.0* 27.1*  PLT 332 373 350    Recent Labs Lab 01/11/17 1809  01/11/17 2219 01/12/17 0243 01/12/17 0916 01/14/17 0653 01/14/17 1238 01/15/17 0648  NA  --   < > 137 137 135 136  --  137  K  --   < > 3.6 3.7 3.5 3.5  --  4.0  CL  --   < > 106 103 106 108  --  109  CO2  --   < > 23 20* 21* 20*  --  21*  GLUCOSE  --   < >  142* 201* 246* 222*  --  229*  BUN  --   < > 11 10 9 9   --  7  CREATININE  --   < > 1.04* 1.01* 0.97 0.94  --  0.89  CALCIUM  --   < > 10.5* 10.5* 10.6* 10.9*  --  10.6*  ALKPHOS 72  --   --   --   --   --  87  --   AST 11*  --   --   --   --   --  12*  --   ALT 12*  --   --   --   --   --  11*  --   ALBUMIN 2.6*  --   --   --   --   --  2.5*  --   < > = values in this interval not displayed.  Results/Tests Pending at Time of Discharge: None  Discharge Medications:  Allergies as of 01/15/2017   No Known Allergies     Medication List    STOP taking these medications    glipiZIDE 10 MG tablet Commonly known as:  GLUCOTROL     TAKE these medications   amLODipine 10 MG tablet Commonly known as:  NORVASC Take 1 tablet (10 mg total) by mouth daily.   aspirin EC 81 MG tablet Take 81 mg by mouth daily. Notes to patient:  1 dose   benzonatate 100 MG capsule Commonly known as:  TESSALON Take 1 capsule (100 mg total) by mouth 2 (two) times daily as needed for cough.   cloNIDine 0.3 MG tablet Commonly known as:  CATAPRES TAKE 1 TABLET BY MOUTH TWICE A DAY Notes to patient:  1 dose this evening.    cromolyn 4 % ophthalmic solution Commonly known as:  OPTICROM Place 1 drop into both eyes 2 (two) times daily as needed. EYE ALLERGIES   gabapentin 300 MG capsule Commonly known as:  NEURONTIN Take 1 capsule (300 mg total) by mouth 3 (three) times daily. What changed:  when to take this  reasons to take this Notes to patient:  1 dose this evening.    hydrochlorothiazide 25 MG tablet Commonly known as:  HYDRODIURIL TAKE 1 TABLET (25 MG TOTAL) BY MOUTH DAILY. Notes to patient:  1 dose.    ibuprofen 800 MG tablet Commonly known as:  ADVIL,MOTRIN Take 1 tablet (800 mg total) by mouth 3 (three) times daily with meals as needed for headache or moderate pain.   insulin glargine 100 UNIT/ML injection Commonly known as:  LANTUS Inject 0.25 mLs (25 Units total) into the skin at bedtime. Notes to patient:  1 dose.    losartan 25 MG tablet Commonly known as:  COZAAR Take 1 tablet (25 mg total) by mouth daily.   megestrol 40 MG tablet Commonly known as:  MEGACE Take 2 tablets (80 mg total) by mouth 2 (two) times daily. Can increase to 3 tablets twice a day for heavy bleeding Notes to patient:  1 dose.    metFORMIN 1000 MG tablet Commonly known as:  GLUCOPHAGE Take 1 tablet (1,000 mg total) by mouth 2 (two) times daily with a meal. Notes to patient:  1 dose.    metoprolol succinate 50 MG 24 hr tablet Commonly known as:  TOPROL XL Take 1 tablet  (50 mg total) by mouth daily. Take with or immediately following a meal.      Discharge Instructions: Please refer to Patient Instructions section of  EMR for full details.  Patient was counseled important signs and symptoms that should prompt return to medical care, changes in medications, dietary instructions, activity restrictions, and follow up appointments.   Follow-Up Appointments: Follow-up Information    Zigmund Gottron, MD. Go on 01/17/2017.   Specialty:  Family Medicine Why:  Go at 8:45 AM.  Please arrive 15 minutes early.  Contact information: [place] [address] [phone]          Lovenia Kim, MD 01/16/2017, 11:52 PM PGY-1, [place]

## 2017-01-17 ENCOUNTER — Ambulatory Visit (INDEPENDENT_AMBULATORY_CARE_PROVIDER_SITE_OTHER): Payer: Medicaid Other | Admitting: Family Medicine

## 2017-01-17 ENCOUNTER — Encounter: Payer: Self-pay | Admitting: Family Medicine

## 2017-01-17 DIAGNOSIS — D509 Iron deficiency anemia, unspecified: Secondary | ICD-10-CM | POA: Insufficient documentation

## 2017-01-17 DIAGNOSIS — K8020 Calculus of gallbladder without cholecystitis without obstruction: Secondary | ICD-10-CM | POA: Diagnosis not present

## 2017-01-17 DIAGNOSIS — D500 Iron deficiency anemia secondary to blood loss (chronic): Secondary | ICD-10-CM

## 2017-01-17 DIAGNOSIS — E1165 Type 2 diabetes mellitus with hyperglycemia: Secondary | ICD-10-CM | POA: Diagnosis not present

## 2017-01-17 DIAGNOSIS — Z794 Long term (current) use of insulin: Secondary | ICD-10-CM

## 2017-01-17 DIAGNOSIS — I10 Essential (primary) hypertension: Secondary | ICD-10-CM

## 2017-01-17 MED ORDER — FERROUS SULFATE 325 (65 FE) MG PO TBEC: 325.0000 mg | DELAYED_RELEASE_TABLET | Freq: Three times a day (TID) | ORAL | 3 refills | Status: DC

## 2017-01-17 NOTE — Patient Instructions (Signed)
Please keep your appointment with Dr. Alvera Novel. Double check your medicines that you are taking with my list.   I think you are over most of the short term problems Long term, the most important things for you are weight loss and working with your doctors to control all your problems like the diabetes. Someone from diabetes management should call to get you in classes.   I will give you a script for diabetes meter and supplies. Check you blood sugar three times per day for now.  Bring that list to Dr. Alvera Novel.   Also, take your meter when you go to the diabetes classes. I started you on iron because you are low and anemic.   You may need a test called an endometrial biopsy because of the bleeding.  I will let you and Dr. B decide. Because fibroids get smaller after menopause, you probably won't need surgery on the fibroid. You have gall stones.  You will probably need surgery eventually to remove your gall bladder.  We will want to get you in better shape before that surgery.

## 2017-01-18 DIAGNOSIS — K8020 Calculus of gallbladder without cholecystitis without obstruction: Secondary | ICD-10-CM | POA: Insufficient documentation

## 2017-01-18 NOTE — Assessment & Plan Note (Signed)
Better control on current meds.  No changes for now.

## 2017-01-18 NOTE — Assessment & Plan Note (Signed)
Asymptomatic as best I can tell.  Observe for now.

## 2017-01-18 NOTE — Assessment & Plan Note (Signed)
Start iron.

## 2017-01-18 NOTE — Progress Notes (Signed)
   Subjective:    Patient ID: Andrea [name], female    DOB: Jun 28, 1961, 56 y.o.   MRN: [id number]  HPI Hospital follow up with lots of issues.  Hospitalization was for DKA, HBP, uterine bleeding.  Issues: 1. DM  Back on insulin and metformin.  Not checking blood sugars because has no meter.  States never had diabetic classes.  States she is motivated to start caring for herself.  Has appointment with PCP, Dr. B, in one week.   2. BBP  On losartin, HCTZ and metoprolol (I think)  Did not bring in meds today. 3. Abnormal uterine bleeding.  She is 55, sounds perimenopausal  And has known fibroids.  Not bleeding now.  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  Does have mild lower abd discomfort that she attributes to fibroids. 4. News to her: has multiple gallstones on ultrasound.  Apparently asymptomatic since no epigastric or RUQ pain. 5. No flu vaccine this year.  Refuses.  Willing to take pneumovax today.    Review of Systems     Objective:   Physical Exam  VS noted Palms and conjunctiva mildly pale.  Lungs clear Cardiac RRR without m or g Abd mild lower abd tenderness. Ext no edema.       Assessment & Plan:

## 2017-01-18 NOTE — Assessment & Plan Note (Signed)
Now insulin requiring.  Continue meds.  Check BS tid - Rx for diabetic testing supplies given. Diabetic education classes.

## 2017-01-19 ENCOUNTER — Emergency Department (HOSPITAL_COMMUNITY)
Admission: EM | Admit: 2017-01-19 | Discharge: 2017-01-19 | Disposition: A | Discharge status: Home / Self Care | Payer: Medicaid Other | Attending: Emergency Medicine | Admitting: Emergency Medicine

## 2017-01-19 ENCOUNTER — Encounter (HOSPITAL_COMMUNITY): Payer: Self-pay | Admitting: Nurse Practitioner

## 2017-01-19 DIAGNOSIS — M545 Low back pain, unspecified: Secondary | ICD-10-CM

## 2017-01-19 DIAGNOSIS — Z79899 Other long term (current) drug therapy: Secondary | ICD-10-CM | POA: Diagnosis not present

## 2017-01-19 DIAGNOSIS — I10 Essential (primary) hypertension: Secondary | ICD-10-CM | POA: Insufficient documentation

## 2017-01-19 DIAGNOSIS — E119 Type 2 diabetes mellitus without complications: Secondary | ICD-10-CM | POA: Diagnosis not present

## 2017-01-19 DIAGNOSIS — R102 Pelvic and perineal pain: Secondary | ICD-10-CM | POA: Insufficient documentation

## 2017-01-19 DIAGNOSIS — Z7982 Long term (current) use of aspirin: Secondary | ICD-10-CM | POA: Insufficient documentation

## 2017-01-19 DIAGNOSIS — Z794 Long term (current) use of insulin: Secondary | ICD-10-CM | POA: Diagnosis not present

## 2017-01-19 LAB — CBC WITH DIFFERENTIAL/PLATELET
Basophils Absolute: 0 10*3/uL (ref 0.0–0.1)
Basophils Relative: 0 %
Eosinophils Absolute: 0.1 10*3/uL (ref 0.0–0.7)
Eosinophils Relative: 1 %
HCT: 32 % — ABNORMAL LOW (ref 36.0–46.0)
Hemoglobin: 10.2 g/dL — ABNORMAL LOW (ref 12.0–15.0)
Lymphocytes Relative: 22 %
Lymphs Abs: 2.1 10*3/uL (ref 0.7–4.0)
MCH: 24.5 pg — ABNORMAL LOW (ref 26.0–34.0)
MCHC: 31.9 g/dL (ref 30.0–36.0)
MCV: 76.9 fL — ABNORMAL LOW (ref 78.0–100.0)
Monocytes Absolute: 0.8 10*3/uL (ref 0.1–1.0)
Monocytes Relative: 8 %
Neutro Abs: 6.9 10*3/uL (ref 1.7–7.7)
Neutrophils Relative %: 69 %
Platelets: 463 10*3/uL — ABNORMAL HIGH (ref 150–400)
RBC: 4.16 MIL/uL (ref 3.87–5.11)
RDW: 17 % — ABNORMAL HIGH (ref 11.5–15.5)
WBC: 9.9 10*3/uL (ref 4.0–10.5)

## 2017-01-19 LAB — COMPREHENSIVE METABOLIC PANEL
ALT: 17 U/L (ref 14–54)
AST: 15 U/L (ref 15–41)
Albumin: 2.7 g/dL — ABNORMAL LOW (ref 3.5–5.0)
Alkaline Phosphatase: 100 U/L (ref 38–126)
Anion gap: 12 (ref 5–15)
BUN: 16 mg/dL (ref 6–20)
CO2: 21 mmol/L — ABNORMAL LOW (ref 22–32)
Calcium: 11.1 mg/dL — ABNORMAL HIGH (ref 8.9–10.3)
Chloride: 103 mmol/L (ref 101–111)
Creatinine, Ser: 1.13 mg/dL — ABNORMAL HIGH (ref 0.44–1.00)
GFR calc Af Amer: >60 mL/min (ref >60)
GFR calc non Af Amer: 53 mL/min — ABNORMAL LOW (ref >60)
Glucose, Bld: 150 mg/dL — ABNORMAL HIGH (ref 65–99)
Potassium: 4 mmol/L (ref 3.5–5.1)
Sodium: 136 mmol/L (ref 135–145)
Total Bilirubin: 0.7 mg/dL (ref 0.3–1.2)
Total Protein: 7.7 g/dL (ref 6.5–8.1)

## 2017-01-19 LAB — URINALYSIS, ROUTINE W REFLEX MICROSCOPIC
Bilirubin Urine: NEGATIVE
Glucose, UA: NEGATIVE mg/dL
Ketones, ur: 5 mg/dL — AB
Nitrite: NEGATIVE
Protein, ur: 30 mg/dL — AB
Specific Gravity, Urine: 1.019 (ref 1.005–1.030)
pH: 5 (ref 5.0–8.0)

## 2017-01-19 MED ORDER — IBUPROFEN 800 MG PO TABS: 800.0000 mg | ORAL_TABLET | Freq: Three times a day (TID) | ORAL | 0 refills | Status: DC | PRN

## 2017-01-19 MED ORDER — MORPHINE SULFATE (PF) 4 MG/ML IV SOLN
4.0000 mg | Freq: Once | INTRAVENOUS | Status: AC
  Administered 2017-01-19: 4 mg via INTRAVENOUS
  Filled 2017-01-19: qty 1

## 2017-01-19 MED ORDER — SODIUM CHLORIDE 0.9 % IV BOLUS (SEPSIS)
1000.0000 mL | Freq: Once | INTRAVENOUS | Status: AC
  Administered 2017-01-19: 1000 mL via INTRAVENOUS

## 2017-01-19 MED ORDER — TRAMADOL HCL 50 MG PO TABS: 50.0000 mg | ORAL_TABLET | Freq: Four times a day (QID) | ORAL | 0 refills | Status: DC | PRN

## 2017-01-19 NOTE — ED Provider Notes (Signed)
[facility] DEPT Provider Note   CSN: [id number] Arrival date & time: 01/19/17  1113     History   Chief Complaint Chief Complaint  Patient presents with  . Back Pain  . Pelvic Pain    HPI Andrea [name] is a 56 y.o. female.  HPI Patient presents to the emergency department with lower abdominal discomfort with lower back pain on the left.  The patient states that she has had similar symptoms recently and was seen and admitted to the hospital for this and states that he was given follow-up with GYN for fibroids of her uterus.  Patient states that she has not had any upper abdominal pain on the right and did have some gallstones and some gallbladder findings on her ultrasound.  Patient states that she did not take any medications prior to arrival for her symptoms.  She states that palpation and movement make the pain worse.  She states that the only new feature to the abdominal discomfort is some left lower back pain. The patient denies chest pain, shortness of breath, headache,blurred vision, neck pain, fever, cough, weakness, numbness, dizziness, anorexia, edema,  rash, dysuria, hematemesis, bloody stool, near syncope, or syncope. Past Medical History:  Diagnosis Date  . Diabetes mellitus   . Environmental allergies   . Fibroid   . High cholesterol   . Hx of cardiovascular stress test    a. ETT-MV 2/14:  EF 46% (normal by echo), low risk, no ischemia or scar  . Hx of echocardiogram    Echo 2/14: mild LVH, EF 55-60%, Gr 1 diast dysfn, mild LAE  . Hypertension   . Hyperthyroidism    Radioactive iodine ablation    Patient Active Problem List   Diagnosis Date Noted  . Cholelithiasis without cholecystitis 01/18/2017  . Anemia, iron deficiency 01/17/2017  . Abdominal pain   . Hyperglycemia   . Increased anion gap metabolic acidosis   . DKA (diabetic ketoacidoses) ([place]) 01/11/2017  . Weight loss 04/09/2016  . Headache 04/09/2016  . Fibroids 04/14/2014  . Abnormal uterine  bleeding (AUB) 04/14/2014  . Unequal leg length 04/16/2013  . Lightheadedness 12/17/2012  . Chest pain 12/14/2012  . Morbid obesity ([place]) 12/29/2011  . High cholesterol   . 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 ANXIETY 02/20/2010  . GERD 01/30/2010  . THYROID NODULE 01/17/2010  . THYROMEGALY 01/16/2010  . DEPRESSION 01/16/2010  . Type II diabetes mellitus ([place]) 09/18/2007  . Essential hypertension, benign 09/18/2007    Past Surgical History:  Procedure Laterality Date  . [place], 2001   x 2    OB History    Gravida Para Term Preterm AB Living   4 3 3   1 3    SAB TAB Ectopic Multiple Live Births   1       3       Home Medications    Prior to Admission medications   Medication Sig Start Date End Date Taking? Authorizing Provider  amLODipine (NORVASC) 10 MG tablet Take 1 tablet (10 mg total) by mouth daily. 06/18/16   Sela Hua, MD  aspirin EC 81 MG tablet Take 81 mg by mouth daily.    Historical Provider, MD  benzonatate (TESSALON) 100 MG capsule Take 1 capsule (100 mg total) by mouth 2 (two) times daily as needed for cough. Patient not taking: Reported on 01/11/2017 11/16/16   Verner Mould, MD  cloNIDine (CATAPRES) 0.3 MG tablet TAKE 1 TABLET BY MOUTH TWICE A  DAY 12/26/16   Donnamae Jude, MD  cromolyn (OPTICROM) 4 % ophthalmic solution Place 1 drop into both eyes 2 (two) times daily as needed. EYE ALLERGIES    Historical Provider, MD  ferrous sulfate 325 (65 FE) MG EC tablet Take 1 tablet (325 mg total) by mouth 3 (three) times daily with meals. 01/17/17   Zenia Resides, MD  gabapentin (NEURONTIN) 300 MG capsule Take 1 capsule (300 mg total) by mouth 3 (three) times daily. Patient taking differently: Take 300 mg by mouth 3 (three) times daily as needed (nerve pain).  12/26/16   Lysbeth Penner, FNP  hydrochlorothiazide (HYDRODIURIL) 25 MG tablet TAKE 1 TABLET (25 MG TOTAL) BY MOUTH DAILY. 04/09/16   Asiyah Cletis Media, MD  ibuprofen  (ADVIL,MOTRIN) 800 MG tablet Take 1 tablet (800 mg total) by mouth 3 (three) times daily with meals as needed for headache or moderate pain. 10/22/16   Osborne Oman, MD  insulin glargine (LANTUS) 100 UNIT/ML injection Inject 0.25 mLs (25 Units total) into the skin at bedtime. 01/15/17   Lovenia Kim, MD  losartan (COZAAR) 25 MG tablet Take 1 tablet (25 mg total) by mouth daily. 11/16/16   Verner Mould, MD  megestrol (MEGACE) 40 MG tablet Take 2 tablets (80 mg total) by mouth 2 (two) times daily. Can increase to 3 tablets twice a day for heavy bleeding 10/22/16   Osborne Oman, MD  metFORMIN (GLUCOPHAGE) 1000 MG tablet Take 1 tablet (1,000 mg total) by mouth 2 (two) times daily with a meal. 04/09/16   Asiyah Cletis Media, MD  metoprolol succinate (TOPROL XL) 50 MG 24 hr tablet Take 1 tablet (50 mg total) by mouth daily. Take with or immediately following a meal. 04/09/16   Asiyah Cletis Media, MD  VOLTAREN 1 % GEL APPLY 2 G TOPICALLY 4 (FOUR) TIMES DAILY. 01/16/17   Virginia Crews, MD    Family History Family History  Problem Relation Age of Onset  . Pneumonia Mother   . Diabetes Mother   . Hypertension Father   . Hypertension Sister   . Hyperlipidemia Sister   . Hypertension Brother   . Hypertension Maternal Aunt   . Hypertension Paternal Aunt   . Cancer Paternal Aunt     throat cancer  . Diabetes Maternal Grandmother   . Diabetes Paternal Grandmother   . Cancer Paternal Grandmother     pancreatic cancer    Social History Social History  Substance Use Topics  . Smoking status: Never Smoker  . Smokeless tobacco: Never Used  . Alcohol use No     Comment: occasional for special occasions     Allergies   Patient has no known allergies.   Review of Systems Review of Systems All other systems negative except as documented in the HPI. All pertinent positives and negatives as reviewed in the HPI.  Physical Exam Updated Vital Signs BP 133/78   Pulse 62   Temp  97.8 F (36.6 C) (Oral)   Resp (!) 25   SpO2 98%   Physical Exam  Constitutional: She is oriented to person, place, and time. She appears well-developed and well-nourished. No distress.  HENT:  Head: Normocephalic and atraumatic.  Mouth/Throat: Oropharynx is clear and moist.  Eyes: Pupils are equal, round, and reactive to light.  Neck: Normal range of motion. Neck supple.  Cardiovascular: Normal rate, regular rhythm and normal heart sounds.  Exam reveals no gallop and no friction rub.   No murmur heard.  Pulmonary/Chest: Effort normal and breath sounds normal. No respiratory distress. She has no wheezes.  Abdominal: Soft. Bowel sounds are normal. She exhibits no distension and no mass. There is tenderness. There is no rebound and no guarding.  Neurological: She is alert and oriented to person, place, and time. She exhibits normal muscle tone. Coordination normal.  Skin: Skin is warm and dry. Capillary refill takes less than 2 seconds. No rash noted. No erythema.  Psychiatric: She has a normal mood and affect. Her behavior is normal.  Nursing note and vitals reviewed.    ED Treatments / Results  Labs (all labs ordered are listed, but only abnormal results are displayed) Labs Reviewed  COMPREHENSIVE METABOLIC PANEL - Abnormal; Notable for the following:       Result Value   CO2 21 (*)    Glucose, Bld 150 (*)    Creatinine, Ser 1.13 (*)    Calcium 11.1 (*)    Albumin 2.7 (*)    GFR calc non Af Amer 53 (*)    All other components within normal limits  CBC WITH DIFFERENTIAL/PLATELET - Abnormal; Notable for the following:    Hemoglobin 10.2 (*)    HCT 32.0 (*)    MCV 76.9 (*)    MCH 24.5 (*)    RDW 17.0 (*)    Platelets 463 (*)    All other components within normal limits  URINALYSIS, ROUTINE W REFLEX MICROSCOPIC - Abnormal; Notable for the following:    APPearance HAZY (*)    Hgb urine dipstick LARGE (*)    Ketones, ur 5 (*)    Protein, ur 30 (*)    Leukocytes, UA TRACE (*)     Bacteria, UA RARE (*)    Squamous Epithelial / LPF 0-5 (*)    All other components within normal limits    EKG  EKG Interpretation None       Radiology No results found.  Procedures Procedures (including critical care time)  Medications Ordered in ED Medications  sodium chloride 0.9 % bolus 1,000 mL (1,000 mLs Intravenous New Bag/Given 01/19/17 1335)  morphine 4 MG/ML injection 4 mg (4 mg Intravenous Given 01/19/17 1335)     Initial Impression / Assessment and Plan / ED Course  I have reviewed the triage vital signs and the nursing notes.  Pertinent labs & imaging results that were available during my care of the patient were reviewed by me and considered in my medical decision making (see chart for details).     Patient is given IV fluids and pain control.  She is advised she will need to follow-up with the GYN doctor for her pelvic pain along with fibroids of your uterus.  The patient agrees the plan and all questions were answered.  I did advise her to follow with her primary care doctors well.  The testing here today did not show any significant abnormalities.  The patient had imaging of her abdomen within the last week.  Therefore no imaging was ordered today as the pain has not changed in quality  Final Clinical Impressions(s) / ED Diagnoses   Final diagnoses:  None    New Prescriptions New Prescriptions   No medications on file     Dalia Heading, PA-C 01/19/17 [place], MD 01/20/17 [phone]

## 2017-01-19 NOTE — Discharge Instructions (Signed)
Return here as needed.  Follow-up with your GYN doctor.  Return here as needed.  Follow-up with your PCP as well

## 2017-01-19 NOTE — ED Triage Notes (Signed)
Pt presents with c/o lower back and pelvic pain. The back pain began about 5 days ago, the pelvic pain started about 10 days ago. She was seen here for the pelvic pain on 3/9 and was diagnosed with fibroids and admitted to the hospital. The pelvic pain has persisted since but her bleeding has slowed down to one pad/day. The lower back pain developed earlier this week and feels like a cramp/tightness and is similar in nature to the pelvic pain shes been having. She denies any weakness,  numbness, nausea, vomiting, urinary changes, constipation, diarrhea.

## 2017-01-22 ENCOUNTER — Inpatient Hospital Stay: Payer: Medicaid Other | Admitting: Family Medicine

## 2017-01-22 ENCOUNTER — Other Ambulatory Visit: Payer: Self-pay | Admitting: Family Medicine

## 2017-01-22 DIAGNOSIS — Z794 Long term (current) use of insulin: Principal | ICD-10-CM

## 2017-01-22 DIAGNOSIS — E1165 Type 2 diabetes mellitus with hyperglycemia: Secondary | ICD-10-CM

## 2017-01-22 DIAGNOSIS — Z23 Encounter for immunization: Secondary | ICD-10-CM | POA: Diagnosis not present

## 2017-01-22 NOTE — Addendum Note (Signed)
Addended by: Londell Moh T on: 01/22/2017 09:28 AM   Modules accepted: Orders, SmartSet

## 2017-01-22 NOTE — Telephone Encounter (Signed)
Pt is calling because she needs a refill on her needles to be called in to the [facility] on [address]. She also had potassium called in but we called it in to the wrong pharmacy. Can we send this to [facility]

## 2017-01-23 MED ORDER — "INSULIN SYRINGE 30G X 1/2"" 0.5 ML MISC": 3 refills | Status: DC

## 2017-01-23 NOTE — Telephone Encounter (Signed)
From chart review, does not appear patient has been prescribed potassium previously (only received a few doses in hospital).  She is taking medications that can make potassium go high and last K was normal on labs.  Needles sent to pharmacy.  No potassium will be RX'd at this time.  Virginia Crews, MD, MPH PGY-3,  [facility] 01/23/2017 9:41 AM

## 2017-01-24 NOTE — Telephone Encounter (Signed)
Left message on voicemail for patient to return call. 

## 2017-01-29 ENCOUNTER — Encounter: Payer: Self-pay | Admitting: Family Medicine

## 2017-01-29 ENCOUNTER — Ambulatory Visit (INDEPENDENT_AMBULATORY_CARE_PROVIDER_SITE_OTHER): Payer: Medicaid Other | Admitting: Family Medicine

## 2017-01-29 VITALS — BP 124/78 | HR 67 | Temp 98.1°F | Ht 65.0 in | Wt 255.0 lb

## 2017-01-29 DIAGNOSIS — R1030 Lower abdominal pain, unspecified: Secondary | ICD-10-CM

## 2017-01-29 DIAGNOSIS — Z794 Long term (current) use of insulin: Secondary | ICD-10-CM | POA: Diagnosis not present

## 2017-01-29 DIAGNOSIS — N939 Abnormal uterine and vaginal bleeding, unspecified: Secondary | ICD-10-CM | POA: Diagnosis not present

## 2017-01-29 DIAGNOSIS — E1165 Type 2 diabetes mellitus with hyperglycemia: Secondary | ICD-10-CM

## 2017-01-29 DIAGNOSIS — I10 Essential (primary) hypertension: Secondary | ICD-10-CM

## 2017-01-29 DIAGNOSIS — M545 Low back pain, unspecified: Secondary | ICD-10-CM

## 2017-01-29 DIAGNOSIS — R634 Abnormal weight loss: Secondary | ICD-10-CM

## 2017-01-29 MED ORDER — INSULIN GLARGINE 100 UNIT/ML SOLOSTAR PEN: 27.0000 [IU] | PEN_INJECTOR | Freq: Every day | SUBCUTANEOUS | 3 refills | Status: DC

## 2017-01-29 MED ORDER — INSULIN PEN NEEDLE 31G X 8 MM MISC: 2 refills | Status: DC

## 2017-01-29 NOTE — Assessment & Plan Note (Signed)
Continues to be problematic and painful Advised follow-up with GYN as patient thinks she would like a hysterectomy Did advise her that fibroids often shrink with menopause

## 2017-01-29 NOTE — Assessment & Plan Note (Signed)
Based on physical exam findings, no evidence of radicular pathology Seems likely related to lumbar paraspinal muscle strain Advised on heat, Tylenol, staying active, and self limited nature Return precautions discussed

## 2017-01-29 NOTE — Assessment & Plan Note (Signed)
Related to fibroids and abnormal uterine bleeding as above

## 2017-01-29 NOTE — Assessment & Plan Note (Signed)
Control improving based on fasting blood glucoses Increase Lantus to 27 units daily at bedtime Patient prefers insulin pens Continue metformin Continue to log fasting blood glucoses Follow-up in one month Advised patient to make eye exam appointment

## 2017-01-29 NOTE — Assessment & Plan Note (Signed)
Seems likely related to new exercise and diet changes Patient is concerned that this weight loss is too quick We will continue to monitor

## 2017-01-29 NOTE — Patient Instructions (Signed)
Nice to see you again today. Your blood pressure is well controlled today. Continue current blood pressure medicines. Continue your metformin. Increase your Lantus to 27 units daily at bedtime.  Continue to keep a log of your fasting first thing in the morning before you eat blood sugars and bring it to next visit in one month.  Make an appointment with your eye doctor as well as her gynecologist. Your back pain is related to muscle strain. Stay active, use heat, and use Tylenol as needed.  Take care, Dr. B   Back Exercises If you have pain in your back, do these exercises 2-3 times each day or as told by your doctor. When the pain goes away, do the exercises once each day, but repeat the steps more times for each exercise (do more repetitions). If you do not have pain in your back, do these exercises once each day or as told by your doctor. Exercises Single Knee to Chest   Do these steps 3-5 times in a row for each leg: 1. Lie on your back on a firm bed or the floor with your legs stretched out. 2. Bring one knee to your chest. 3. Hold your knee to your chest by grabbing your knee or thigh. 4. Pull on your knee until you feel a gentle stretch in your lower back. 5. Keep doing the stretch for 10-30 seconds. 6. Slowly let go of your leg and straighten it. Pelvic Tilt   Do these steps 5-10 times in a row: 1. Lie on your back on a firm bed or the floor with your legs stretched out. 2. Bend your knees so they point up to the ceiling. Your feet should be flat on the floor. 3. Tighten your lower belly (abdomen) muscles to press your lower back against the floor. This will make your tailbone point up to the ceiling instead of pointing down to your feet or the floor. 4. Stay in this position for 5-10 seconds while you gently tighten your muscles and breathe evenly. Cat-Cow   Do these steps until your lower back bends more easily: 1. Get on your hands and knees on a firm surface. Keep your hands  under your shoulders, and keep your knees under your hips. You may put padding under your knees. 2. Let your head hang down, and make your tailbone point down to the floor so your lower back is round like the back of a cat. 3. Stay in this position for 5 seconds. 4. Slowly lift your head and make your tailbone point up to the ceiling so your back hangs low (sags) like the back of a cow. 5. Stay in this position for 5 seconds. Press-Ups   Do these steps 5-10 times in a row: 1. Lie on your belly (face-down) on the floor. 2. Place your hands near your head, about shoulder-width apart. 3. While you keep your back relaxed and keep your hips on the floor, slowly straighten your arms to raise the top half of your body and lift your shoulders. Do not use your back muscles. To make yourself more comfortable, you may change where you place your hands. 4. Stay in this position for 5 seconds. 5. Slowly return to lying flat on the floor. Bridges   Do these steps 10 times in a row: 1. Lie on your back on a firm surface. 2. Bend your knees so they point up to the ceiling. Your feet should be flat on the floor. 3. Tighten  your butt muscles and lift your butt off of the floor until your waist is almost as high as your knees. If you do not feel the muscles working in your butt and the back of your thighs, slide your feet 1-2 inches farther away from your butt. 4. Stay in this position for 3-5 seconds. 5. Slowly lower your butt to the floor, and let your butt muscles relax. If this exercise is too easy, try doing it with your arms crossed over your chest. Belly Crunches   Do these steps 5-10 times in a row: 1. Lie on your back on a firm bed or the floor with your legs stretched out. 2. Bend your knees so they point up to the ceiling. Your feet should be flat on the floor. 3. Cross your arms over your chest. 4. Tip your chin a little bit toward your chest but do not bend your neck. 5. Tighten your belly  muscles and slowly raise your chest just enough to lift your shoulder blades a tiny bit off of the floor. 6. Slowly lower your chest and your head to the floor. Back Lifts  Do these steps 5-10 times in a row: 1. Lie on your belly (face-down) with your arms at your sides, and rest your forehead on the floor. 2. Tighten the muscles in your legs and your butt. 3. Slowly lift your chest off of the floor while you keep your hips on the floor. Keep the back of your head in line with the curve in your back. Look at the floor while you do this. 4. Stay in this position for 3-5 seconds. 5. Slowly lower your chest and your face to the floor. Contact a doctor if:  Your back pain gets a lot worse when you do an exercise.  Your back pain does not lessen 2 hours after you exercise. If you have any of these problems, stop doing the exercises. Do not do them again unless your doctor says it is okay. Get help right away if:  You have sudden, very bad back pain. If this happens, stop doing the exercises. Do not do them again unless your doctor says it is okay. This information is not intended to replace advice given to you by your health care provider. Make sure you discuss any questions you have with your health care provider. Document Released: 11/24/2010 Document Revised: 03/29/2016 Document Reviewed: 12/16/2014 Elsevier Interactive Patient Education  2017 [employer].

## 2017-01-29 NOTE — Assessment & Plan Note (Signed)
Well controlled. Continue current medications  

## 2017-01-29 NOTE — Progress Notes (Signed)
Subjective:   Andrea [name] is a 56 y.o. female with a history of HTN, T2 DM, GERD, abnormal uterine bleeding, morbid obesity here for chronic disease management  HTN: - Medications: amlodipine 10mg  daily, clonidine 0.3mg  BID, HCTZ 25mg  daily,losartan 25mg  daily, toprol 50mg  daily - Compliance: good  - Checking BP at home: no - Denies any SOB, CP, medication SEs, or symptoms of hypotension - some intermittent LE edema  T2DM - Checking BG at home: fastings 100-135 - Medications: Metformin 1000mg  BID, Lantus 25 units qhs - Compliance: good - having trouble giving insulin (others have to give it) - Diet: cut back on sodas, portion size - Exercise: walking 2x/wk for 15-20 min - trying to build up - eye exam: trying to schedule an appt with Dr Frederico Hamman - foot exam: completed 05/2016 - microalbumin: n/a ARB - denies symptoms of hypoglycemia, polyuria, polydipsia, numbness extremities, foot ulcers/trauma  AUB/pelvic pain/fibroid uterus - LBP - previously saw GYN 11/26/16 - had benign pap and endometrial biopsy - placed on Megace - continues to have intermittent bleeding x months - now with pain in low back - not flank pain x1 month - stabbing pain - worse with trying to stand up from sitting - no radiation, weakness, numbness  Weight loss - no night sweats, fevers - trying to cut back on sodas, portion sizes - down 8 lbs in last 2 weeks per chart review  Review of Systems:  Per HPI.   Social History: never smoker  Objective:  BP 124/78   Pulse 67   Temp 98.1 F (36.7 C) (Oral)   Ht 5\' 5"  (1.651 m)   Wt 255 lb (115.7 kg)   SpO2 98%   BMI 42.43 kg/m   Gen:  56 y.o. female in NAD HEENT: NCAT, MMM, EOMI, PERRL, anicteric sclerae CV: RRR, no MRG Resp: Non-labored, CTAB, no wheezes noted Abd: Soft, Mildly tender to palpation in right and left lower quadrants, BS present, no guarding or organomegaly Ext: WWP, no edema MSK: No midline back tenderness, mild tenderness to  palpation over lumbar paraspinal muscles bilaterally, negative straight leg raise, strength and sensation intact in lower extremities Neuro: Alert and oriented, speech normal       Chemistry      Component Value Date/Time   NA 136 01/19/2017 1300   K 4.0 01/19/2017 1300   CL 103 01/19/2017 1300   CO2 21 (L) 01/19/2017 1300   BUN 16 01/19/2017 1300   CREATININE 1.13 (H) 01/19/2017 1300   CREATININE 0.91 04/09/2016 1213      Component Value Date/Time   CALCIUM 11.1 (H) 01/19/2017 1300   ALKPHOS 100 01/19/2017 1300   AST 15 01/19/2017 1300   ALT 17 01/19/2017 1300   BILITOT 0.7 01/19/2017 1300      Lab Results  Component Value Date   WBC 9.9 01/19/2017   HGB 10.2 (L) 01/19/2017   HCT 32.0 (L) 01/19/2017   MCV 76.9 (L) 01/19/2017   PLT 463 (H) 01/19/2017   Lab Results  Component Value Date   TSH 0.827 01/11/2017   Lab Results  Component Value Date   HGBA1C 12.8 (H) 01/12/2017   Assessment & Plan:     Andrea [name] is a 56 y.o. female here for   Essential hypertension, benign Well-controlled Continue current medications  Type II diabetes mellitus ([place]) Control improving based on fasting blood glucoses Increase Lantus to 27 units daily at bedtime Patient prefers insulin pens Continue metformin Continue to log  fasting blood glucoses Follow-up in one month Advised patient to make eye exam appointment  Abnormal uterine bleeding (AUB) Continues to be problematic and painful Advised follow-up with GYN as patient thinks she would like a hysterectomy Did advise her that fibroids often shrink with menopause  Weight loss Seems likely related to new exercise and diet changes Patient is concerned that this weight loss is too quick We will continue to monitor  Low back pain Based on physical exam findings, no evidence of radicular pathology Seems likely related to lumbar paraspinal muscle strain Advised on heat, Tylenol, staying active, and self limited  nature Return precautions discussed  Abdominal pain Related to fibroids and abnormal uterine bleeding as above   Virginia Crews, MD MPH PGY-3,  [facility] 01/29/2017  12:18 PM

## 2017-02-06 ENCOUNTER — Other Ambulatory Visit: Payer: Self-pay | Admitting: Family Medicine

## 2017-02-06 DIAGNOSIS — I10 Essential (primary) hypertension: Secondary | ICD-10-CM

## 2017-02-15 ENCOUNTER — Ambulatory Visit: Payer: Medicaid Other | Admitting: Registered"

## 2017-02-18 ENCOUNTER — Encounter (HOSPITAL_COMMUNITY): Payer: Self-pay | Admitting: Emergency Medicine

## 2017-02-18 ENCOUNTER — Emergency Department (HOSPITAL_COMMUNITY): Payer: Medicaid Other

## 2017-02-18 DIAGNOSIS — R079 Chest pain, unspecified: Secondary | ICD-10-CM | POA: Diagnosis present

## 2017-02-18 DIAGNOSIS — Z794 Long term (current) use of insulin: Secondary | ICD-10-CM | POA: Diagnosis not present

## 2017-02-18 DIAGNOSIS — Z7982 Long term (current) use of aspirin: Secondary | ICD-10-CM | POA: Insufficient documentation

## 2017-02-18 DIAGNOSIS — Z79899 Other long term (current) drug therapy: Secondary | ICD-10-CM | POA: Insufficient documentation

## 2017-02-18 DIAGNOSIS — E119 Type 2 diabetes mellitus without complications: Secondary | ICD-10-CM | POA: Insufficient documentation

## 2017-02-18 DIAGNOSIS — R0789 Other chest pain: Secondary | ICD-10-CM | POA: Insufficient documentation

## 2017-02-18 DIAGNOSIS — I10 Essential (primary) hypertension: Secondary | ICD-10-CM | POA: Insufficient documentation

## 2017-02-18 LAB — BASIC METABOLIC PANEL
ANION GAP: 8 (ref 5–15)
BUN: 16 mg/dL (ref 6–20)
CO2: 24 mmol/L (ref 22–32)
Calcium: 10.8 mg/dL — ABNORMAL HIGH (ref 8.9–10.3)
Chloride: 106 mmol/L (ref 101–111)
Creatinine, Ser: 1.15 mg/dL — ABNORMAL HIGH (ref 0.44–1.00)
GFR calc Af Amer: >60 mL/min (ref >60)
GFR, EST NON AFRICAN AMERICAN: 52 mL/min — AB (ref >60)
GLUCOSE: 149 mg/dL — AB (ref 65–99)
Potassium: 3.4 mmol/L — ABNORMAL LOW (ref 3.5–5.1)
Sodium: 138 mmol/L (ref 135–145)

## 2017-02-18 LAB — CBC
HEMATOCRIT: 30 % — AB (ref 36.0–46.0)
HEMOGLOBIN: 9.6 g/dL — AB (ref 12.0–15.0)
MCH: 25.1 pg — AB (ref 26.0–34.0)
MCHC: 32 g/dL (ref 30.0–36.0)
MCV: 78.5 fL (ref 78.0–100.0)
Platelets: 368 10*3/uL (ref 150–400)
RBC: 3.82 MIL/uL — ABNORMAL LOW (ref 3.87–5.11)
RDW: 17.2 % — ABNORMAL HIGH (ref 11.5–15.5)
WBC: 8 10*3/uL (ref 4.0–10.5)

## 2017-02-18 LAB — I-STAT TROPONIN, ED: Troponin i, poc: 0 ng/mL (ref 0.00–0.08)

## 2017-02-18 NOTE — ED Triage Notes (Signed)
Pt c/o 6/10 right chest pain that started 30 min ago and numbness on her left arm. Denies any SOB, nausea or vomiting.

## 2017-02-19 ENCOUNTER — Emergency Department (HOSPITAL_COMMUNITY)
Admission: EM | Admit: 2017-02-19 | Discharge: 2017-02-19 | Disposition: A | Discharge status: Home / Self Care | Payer: Medicaid Other | Attending: Emergency Medicine | Admitting: Emergency Medicine

## 2017-02-19 DIAGNOSIS — R0789 Other chest pain: Secondary | ICD-10-CM

## 2017-02-19 LAB — I-STAT TROPONIN, ED: Troponin i, poc: 0.01 ng/mL (ref 0.00–0.08)

## 2017-02-19 MED ORDER — KETOROLAC TROMETHAMINE 30 MG/ML IJ SOLN
60.0000 mg | Freq: Once | INTRAMUSCULAR | Status: AC
  Administered 2017-02-19: 60 mg via INTRAMUSCULAR
  Filled 2017-02-19: qty 2

## 2017-02-19 NOTE — ED Provider Notes (Signed)
TIME SEEN: 2:30 AM  02/19/2017  CHIEF COMPLAINT: Chest pain  HPI: This is a 56 year old female with with history of hypertension and diabetes who presents to the ED for evaluation of chest pain. This patient states that 3 hours ago she was watching television when she developed "stabbing" R sided chest pain which radiated into her back and neck. No dyspnea. Worse when changing position or palpating. Not exertional. No nausea, vomiting, lightheadedness, or diaphoresis. No fever. Admits to a mild dry cough. Also a brief period of L forearm paresthesias that resolved. No peripheral edema. Not a tobacco user.  Father with history of "heart trouble"; but she denies CAD, stent, MI, CHF. No family history of CAD. Stress test in 2014 which was negative. No prior heart catheterizations. No history of PE, DVT, exogenous estrogen use, recent fractures, surgery, trauma, hospitalization or prolonged travel. No lower extremity swelling or pain. No calf tenderness.  ROS: See HPI Constitutional: no fever  Eyes: no drainage  ENT: no runny nose   Cardiovascular: no peripheral edema  + chest pain  Resp: no SOB  GI: no vomiting GU: no dysuria Integumentary: no rash  Allergy: no hives  Musculoskeletal: no leg swelling  Neurological: no slurred speech + brief L forearm paresthesias ROS otherwise negative  PAST MEDICAL HISTORY/PAST SURGICAL HISTORY:  Past Medical History:  Diagnosis Date  . Diabetes mellitus   . Environmental allergies   . Fibroid   . High cholesterol   . Hx of cardiovascular stress test    a. ETT-MV 2/14:  EF 46% (normal by echo), low risk, no ischemia or scar  . Hx of echocardiogram    Echo 2/14: mild LVH, EF 55-60%, Gr 1 diast dysfn, mild LAE  . Hypertension   . Hyperthyroidism    Radioactive iodine ablation    MEDICATIONS:  Prior to Admission medications   Medication Sig Start Date End Date Taking? Authorizing Provider  amLODipine (NORVASC) 10 MG tablet Take 1 tablet (10 mg total)  by mouth daily. 06/18/16   Sela Hua, MD  aspirin EC 81 MG tablet Take 81 mg by mouth daily.    Historical Provider, MD  cloNIDine (CATAPRES) 0.3 MG tablet TAKE 1 TABLET BY MOUTH TWICE A DAY 02/08/17   Tanna Savoy Stinson, DO  cromolyn (OPTICROM) 4 % ophthalmic solution Place 1 drop into both eyes 2 (two) times daily as needed. EYE ALLERGIES    Historical Provider, MD  ferrous sulfate 325 (65 FE) MG EC tablet Take 1 tablet (325 mg total) by mouth 3 (three) times daily with meals. 01/17/17   Zenia Resides, MD  gabapentin (NEURONTIN) 300 MG capsule Take 1 capsule (300 mg total) by mouth 3 (three) times daily. Patient taking differently: Take 300 mg by mouth 3 (three) times daily as needed (nerve pain).  12/26/16   Lysbeth Penner, FNP  hydrochlorothiazide (HYDRODIURIL) 25 MG tablet TAKE 1 TABLET (25 MG TOTAL) BY MOUTH DAILY. 04/09/16   Asiyah Cletis Media, MD  ibuprofen (ADVIL,MOTRIN) 800 MG tablet Take 1 tablet (800 mg total) by mouth every 8 (eight) hours as needed. 01/19/17   Dalia Heading, PA-C  Insulin Glargine (LANTUS SOLOSTAR) 100 UNIT/ML Solostar Pen Inject 27 Units into the skin at bedtime. 01/29/17   Virginia Crews, MD  Insulin Pen Needle (B-D ULTRAFINE III SHORT PEN) 31G X 8 MM MISC Use as directed to inject insulin qhs 01/29/17   Virginia Crews, MD  Insulin Syringe-Needle U-100 (INSULIN SYRINGE .5CC/30GX1/2") 30G  X 1/2" 0.5 ML MISC Use to inject insulin daily as prescribed 01/23/17   Virginia Crews, MD  losartan (COZAAR) 25 MG tablet Take 1 tablet (25 mg total) by mouth daily. 11/16/16   Verner Mould, MD  megestrol (MEGACE) 40 MG tablet Take 2 tablets (80 mg total) by mouth 2 (two) times daily. Can increase to 3 tablets twice a day for heavy bleeding 10/22/16   Osborne Oman, MD  metFORMIN (GLUCOPHAGE) 1000 MG tablet Take 1 tablet (1,000 mg total) by mouth 2 (two) times daily with a meal. 04/09/16   Asiyah Cletis Media, MD  metoprolol succinate (TOPROL XL) 50 MG 24  hr tablet Take 1 tablet (50 mg total) by mouth daily. Take with or immediately following a meal. 04/09/16   Asiyah Cletis Media, MD  traMADol (ULTRAM) 50 MG tablet Take 1 tablet (50 mg total) by mouth every 6 (six) hours as needed for severe pain. 01/19/17   Christopher Lawyer, PA-C  VOLTAREN 1 % GEL APPLY 2 G TOPICALLY 4 (FOUR) TIMES DAILY. 01/16/17   Virginia Crews, MD    ALLERGIES:  No Known Allergies  SOCIAL HISTORY:  Social History  Substance Use Topics  . Smoking status: Never Smoker  . Smokeless tobacco: Never Used  . Alcohol use No     Comment: occasional for special occasions    FAMILY HISTORY: Family History  Problem Relation Age of Onset  . Pneumonia Mother   . Diabetes Mother   . Hypertension Father   . Hypertension Sister   . Hyperlipidemia Sister   . Hypertension Brother   . Hypertension Maternal Aunt   . Hypertension Paternal Aunt   . Cancer Paternal Aunt     throat cancer  . Diabetes Maternal Grandmother   . Diabetes Paternal Grandmother   . Cancer Paternal Grandmother     pancreatic cancer    EXAM: BP (!) 196/87   Pulse (!) 55   Temp 97.7 F (36.5 C) (Oral)   Resp 18   Ht 5\' 6"  (1.676 m)   Wt 255 lb (115.7 kg)   SpO2 96%   BMI 41.16 kg/m  CONSTITUTIONAL: Alert and oriented and responds appropriately to questions. Well-appearing; well-nourished HEAD: Normocephalic EYES: Conjunctivae clear, pupils appear equal, EOMI ENT: normal nose; moist mucous membranes NECK: Supple, no meningismus, no nuchal rigidity, no LAD  CARD: RRR; S1 and S2 appreciated; no murmurs, no clicks, no rubs, no gallops CHEST:  Chest wall is tender to palpation Over the right chest wall which reproduces her pain.  No crepitus, ecchymosis, erythema, warmth, rash or other lesions present.   RESP: Normal chest excursion without splinting or tachypnea; breath sounds clear and equal bilaterally; no wheezes, no rhonchi, no rales, no hypoxia or respiratory distress, speaking full  sentences ABD/GI: Normal bowel sounds; non-distended; soft, non-tender, no rebound, no guarding, no peritoneal signs, no hepatosplenomegaly BACK:  The back appears normal and is non-tender to palpation, there is no CVA tenderness EXT: Normal ROM in all joints; non-tender to palpation; no edema; normal capillary refill; no cyanosis, no calf tenderness or swelling    SKIN: Normal color for age and race; warm; no rash NEURO: Moves all extremities equally PSYCH: The patient's mood and manner are appropriate. Grooming and personal hygiene are appropriate.  MEDICAL DECISION MAKING: Patient here with complaints of chest pain. Seems to be very atypical, likely musculoskeletal.  HEART score is 2. Doubt ACS, dissection, PE. First troponin negative. Given she does have some risk factors  will obtain a second troponin. We'll give Toradol for pain control and reassess. Chest x-ray clear.  ED PROGRESS: 4:10 AM  Pt's second troponin is negative. I feel she is safe to be discharged home in alternate Tylenol and Motrin as needed for pain. Recommend close follow-up with her primary care provider. Again I suspect this is musculoskeletal pain. She reports feeling better after Toradol. She is comfortable with plan for discharge home.   At this time, I do not feel there is any life-threatening condition present. I have reviewed and discussed all results (EKG, imaging, lab, urine as appropriate) and exam findings with patient/family. I have reviewed nursing notes and appropriate previous records.  I feel the patient is safe to be discharged home without further emergent workup and can continue workup as an outpatient as needed. Discussed usual and customary return precautions. Patient/family verbalize understanding and are comfortable with this plan.  Outpatient follow-up has been provided if needed. All questions have been answered.    EKG Interpretation  Date/Time:  Monday February 18 2017 23:03:59 EDT Ventricular Rate:   65 PR Interval:  160 QRS Duration: 92 QT Interval:  396 QTC Calculation: 411 R Axis:   -2 Text Interpretation:  Normal sinus rhythm Low voltage QRS Cannot rule out Anterior infarct , age undetermined Abnormal ECG No significant change since last tracing Confirmed by WARD,  DO, KRISTEN [phone]) on 02/19/2017 2:00:40 AM        I personally performed the services described in this documentation, which was scribed in my presence. The recorded information has been reviewed and is accurate.    [place], DO 02/19/17 [phone]

## 2017-02-19 NOTE — ED Notes (Signed)
ED Provider at bedside. 

## 2017-02-19 NOTE — Discharge Instructions (Signed)
You may alternate Tylenol 1000 mg every 6 hours as needed for pain and Ibuprofen 800 mg every 8 hours as needed for pain.  Please take Ibuprofen with food.   Please make an appointment to follow-up with your primary care physician this week.

## 2017-03-18 ENCOUNTER — Ambulatory Visit: Payer: Medicaid Other | Admitting: Registered"

## 2017-04-06 ENCOUNTER — Other Ambulatory Visit: Payer: Self-pay | Admitting: Family Medicine

## 2017-04-06 DIAGNOSIS — I10 Essential (primary) hypertension: Secondary | ICD-10-CM

## 2017-04-30 ENCOUNTER — Other Ambulatory Visit: Payer: Self-pay | Admitting: *Deleted

## 2017-04-30 DIAGNOSIS — I10 Essential (primary) hypertension: Secondary | ICD-10-CM

## 2017-04-30 MED ORDER — METOPROLOL SUCCINATE ER 50 MG PO TB24: 50.0000 mg | ORAL_TABLET | Freq: Every day | ORAL | 6 refills | Status: DC

## 2017-04-30 MED ORDER — LOSARTAN POTASSIUM 25 MG PO TABS: 25.0000 mg | ORAL_TABLET | Freq: Every day | ORAL | 2 refills | Status: DC

## 2017-06-03 ENCOUNTER — Other Ambulatory Visit: Payer: Self-pay | Admitting: *Deleted

## 2017-06-03 DIAGNOSIS — I10 Essential (primary) hypertension: Secondary | ICD-10-CM

## 2017-06-03 MED ORDER — HYDROCHLOROTHIAZIDE 25 MG PO TABS: ORAL_TABLET | ORAL | 2 refills | Status: DC

## 2017-09-20 ENCOUNTER — Other Ambulatory Visit: Payer: Self-pay | Admitting: *Deleted

## 2017-09-20 DIAGNOSIS — Z794 Long term (current) use of insulin: Principal | ICD-10-CM

## 2017-09-20 DIAGNOSIS — E1165 Type 2 diabetes mellitus with hyperglycemia: Secondary | ICD-10-CM

## 2017-09-23 MED ORDER — INSULIN PEN NEEDLE 31G X 8 MM MISC: 2 refills | Status: DC

## 2017-10-03 ENCOUNTER — Emergency Department (HOSPITAL_COMMUNITY): Payer: Medicaid Other

## 2017-10-03 ENCOUNTER — Encounter (HOSPITAL_COMMUNITY): Payer: Self-pay | Admitting: Emergency Medicine

## 2017-10-03 ENCOUNTER — Emergency Department (HOSPITAL_COMMUNITY)
Admission: EM | Admit: 2017-10-03 | Discharge: 2017-10-03 | Disposition: A | Discharge status: Home / Self Care | Payer: Medicaid Other | Attending: Emergency Medicine | Admitting: Emergency Medicine

## 2017-10-03 DIAGNOSIS — R0789 Other chest pain: Secondary | ICD-10-CM | POA: Diagnosis present

## 2017-10-03 DIAGNOSIS — E119 Type 2 diabetes mellitus without complications: Secondary | ICD-10-CM | POA: Diagnosis not present

## 2017-10-03 DIAGNOSIS — Z79899 Other long term (current) drug therapy: Secondary | ICD-10-CM | POA: Insufficient documentation

## 2017-10-03 DIAGNOSIS — Z7982 Long term (current) use of aspirin: Secondary | ICD-10-CM | POA: Diagnosis not present

## 2017-10-03 DIAGNOSIS — I10 Essential (primary) hypertension: Secondary | ICD-10-CM | POA: Diagnosis not present

## 2017-10-03 DIAGNOSIS — Z794 Long term (current) use of insulin: Secondary | ICD-10-CM | POA: Diagnosis not present

## 2017-10-03 DIAGNOSIS — Z8679 Personal history of other diseases of the circulatory system: Secondary | ICD-10-CM | POA: Insufficient documentation

## 2017-10-03 LAB — CBC
HEMATOCRIT: 38.3 % (ref 36.0–46.0)
HEMOGLOBIN: 12 g/dL (ref 12.0–15.0)
MCH: 23.7 pg — AB (ref 26.0–34.0)
MCHC: 31.3 g/dL (ref 30.0–36.0)
MCV: 75.5 fL — ABNORMAL LOW (ref 78.0–100.0)
Platelets: 439 10*3/uL — ABNORMAL HIGH (ref 150–400)
RBC: 5.07 MIL/uL (ref 3.87–5.11)
RDW: 15.5 % (ref 11.5–15.5)
WBC: 7.9 10*3/uL (ref 4.0–10.5)

## 2017-10-03 LAB — BASIC METABOLIC PANEL
ANION GAP: 9 (ref 5–15)
BUN: 21 mg/dL — ABNORMAL HIGH (ref 6–20)
CALCIUM: 10.8 mg/dL — AB (ref 8.9–10.3)
CO2: 23 mmol/L (ref 22–32)
Chloride: 105 mmol/L (ref 101–111)
Creatinine, Ser: 1.21 mg/dL — ABNORMAL HIGH (ref 0.44–1.00)
GFR calc Af Amer: 56 mL/min — ABNORMAL LOW (ref >60)
GFR calc non Af Amer: 49 mL/min — ABNORMAL LOW (ref >60)
GLUCOSE: 187 mg/dL — AB (ref 65–99)
Potassium: 3.4 mmol/L — ABNORMAL LOW (ref 3.5–5.1)
Sodium: 137 mmol/L (ref 135–145)

## 2017-10-03 LAB — I-STAT TROPONIN, ED
TROPONIN I, POC: 0.01 ng/mL (ref 0.00–0.08)
Troponin i, poc: 0 ng/mL (ref 0.00–0.08)

## 2017-10-03 MED ORDER — ASPIRIN 81 MG PO CHEW
324.0000 mg | CHEWABLE_TABLET | Freq: Once | ORAL | Status: AC
  Administered 2017-10-03: 324 mg via ORAL
  Filled 2017-10-03: qty 4

## 2017-10-03 NOTE — ED Triage Notes (Signed)
Patient reports intermittent central chest pain with SOB and nausea onset last week , mild nausea /no diaphoresis , Hypertensive at triage BP = 212/88 .

## 2017-10-03 NOTE — ED Provider Notes (Signed)
  Physical Exam  BP (!) 162/99   Pulse (!) 57   Temp (!) 97.2 F (36.2 C) (Oral)   Resp 19   Ht 5\' 6"  (1.676 m)   Wt 116.1 kg (256 lb)   SpO2 100%   BMI 41.32 kg/m   Physical Exam  Constitutional: She is oriented to person, place, and time. She appears well-developed and well-nourished. No distress.  HENT:  Head: Normocephalic and atraumatic.  Right Ear: External ear normal.  Left Ear: External ear normal.  Nose: Nose normal.  Eyes: Conjunctivae and EOM are normal. No scleral icterus.  Neck: Normal range of motion and phonation normal.  Cardiovascular: Normal rate and regular rhythm.  Pulmonary/Chest: Effort normal. No stridor. No respiratory distress.  Abdominal: She exhibits no distension.  Musculoskeletal: Normal range of motion. She exhibits no edema.  Neurological: She is alert and oriented to person, place, and time.  Skin: She is not diaphoretic.  Psychiatric: She has a normal mood and affect. Her behavior is normal.  Vitals reviewed.   ED Course  Procedures  MDM I assumed care of this patient from Dr. Christy Gentles at 0730.  Please see their note for further details of Hx, PE.  Briefly patient is a 56 y.o. female who presented with atypical chest pain.  Heart score of 3.  EKG unchanged.  Initial troponin negative.  Plan is to follow-up on a delta troponin.  There is low suspicion for pulmonary embolism, aortic dissection or esophageal perforation.  Rest of the workup was reassuring..   Delta troponin negative.   The patient is safe for discharge with strict return precautions.   Disposition: Discharge  Condition: Good  I have discussed the results, Dx and Tx plan with the patient who expressed understanding and agree(s) with the plan. Discharge instructions discussed at great length. The patient was given strict return precautions who verbalized understanding of the instructions. No further questions at time of discharge.    ED Discharge Orders    None        Follow Up: Primary care provider  Schedule an appointment as soon as possible for a visit  As needed         Nathifa Ritthaler, Grayce Sessions, MD 10/03/17 [phone]

## 2017-10-03 NOTE — ED Provider Notes (Signed)
[facility] DEPARTMENT Provider Note   CSN: [id number] Arrival date & time: 10/03/17  0349     History   Chief Complaint Chief Complaint  Patient presents with  . Chest Pain  . Hypertension    BP =212/88    HPI Andrea [name] is a 56 y.o. female.  The history is provided by the patient.  Chest Pain   This is a recurrent problem. The current episode started 1 to 2 hours ago. The problem has been gradually improving. The pain is present in the substernal region. The pain is mild. The quality of the pain is described as sharp. Radiates to: Whole body. Pertinent negatives include no fever, no shortness of breath, no syncope and no vomiting. She has tried nothing for the symptoms. Risk factors include obesity.  Her past medical history is significant for diabetes, hyperlipidemia and hypertension.  Pertinent negatives for past medical history include no CAD and no PE.  Her family medical history is significant for CAD.  Hypertension  Associated symptoms include chest pain. Pertinent negatives include no shortness of breath.  Patient reports onset of chest pain tonight prior to arrival She reports it feels like a small stabbing pain into her chest She denies any ripping tearing sensation to her back Reports after the pain starts in her chest it goes throughout her whole body She reports generalized weakness No syncope, no focal weakness She reports her blood pressure is elevated because she ate Fritos chips before she came in  Past Medical History:  Diagnosis Date  . Diabetes mellitus   . Environmental allergies   . Fibroid   . High cholesterol   . Hx of cardiovascular stress test    a. ETT-MV 2/14:  EF 46% (normal by echo), low risk, no ischemia or scar  . Hx of echocardiogram    Echo 2/14: mild LVH, EF 55-60%, Gr 1 diast dysfn, mild LAE  . Hypertension   . Hyperthyroidism    Radioactive iodine ablation    Patient Active Problem List   Diagnosis Date Noted  . Low back pain 01/29/2017  . Cholelithiasis without cholecystitis 01/18/2017  . Anemia, iron deficiency 01/17/2017  . Abdominal pain   . Hyperglycemia   . Increased anion gap metabolic acidosis   . Weight loss 04/09/2016  . Headache 04/09/2016  . Fibroids 04/14/2014  . Abnormal uterine bleeding (AUB) 04/14/2014  . Unequal leg length 04/16/2013  . Lightheadedness 12/17/2012  . Chest pain 12/14/2012  . Morbid obesity ([place]) 12/29/2011  . High cholesterol   . 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 ANXIETY 02/20/2010  . GERD 01/30/2010  . THYROID NODULE 01/17/2010  . THYROMEGALY 01/16/2010  . DEPRESSION 01/16/2010  . Type II diabetes mellitus ([place]) 09/18/2007  . Essential hypertension, benign 09/18/2007    Past Surgical History:  Procedure Laterality Date  . [place], 2001   x 2    OB History    Gravida Para Term Preterm AB Living   4 3 3   1 3    SAB TAB Ectopic Multiple Live Births   1       3       Home Medications    Prior to Admission medications   Medication Sig Start Date End Date Taking? Authorizing Provider  amLODipine (NORVASC) 10 MG tablet Take 1 tablet (10 mg total) by mouth daily. 06/18/16  Yes Mayo, Pete Pelt, MD  aspirin EC 81 MG tablet Take 81 mg by mouth  daily.   Yes [provider]  cloNIDine (CATAPRES) 0.3 MG tablet TAKE 1 TABLET BY MOUTH TWICE A DAY 02/08/17  Yes Truett Mainland, DO  gabapentin (NEURONTIN) 300 MG capsule Take 1 capsule (300 mg total) by mouth 3 (three) times daily. Patient taking differently: Take 300 mg by mouth 3 (three) times daily as needed (nerve pain).  12/26/16  Yes Lysbeth Penner, FNP  hydrochlorothiazide (HYDRODIURIL) 25 MG tablet TAKE 1 TABLET (25 MG TOTAL) BY MOUTH DAILY. 06/03/17  Yes Verner Mould, MD  ibuprofen (ADVIL,MOTRIN) 800 MG tablet Take 1 tablet (800 mg total) by mouth every 8 (eight) hours as needed. Patient taking differently: Take 800 mg by mouth  every 8 (eight) hours as needed for headache or mild pain.  01/19/17  Yes Lawyer, Harrell Gave, PA-C  Insulin Glargine (LANTUS SOLOSTAR) 100 UNIT/ML Solostar Pen Inject 27 Units into the skin at bedtime. 01/29/17  Yes Bacigalupo, Dionne Bucy, MD  losartan (COZAAR) 25 MG tablet Take 1 tablet (25 mg total) by mouth daily. 04/30/17  Yes Bacigalupo, Dionne Bucy, MD  megestrol (MEGACE) 40 MG tablet Take 2 tablets (80 mg total) by mouth 2 (two) times daily. Can increase to 3 tablets twice a day for heavy bleeding 10/22/16  Yes Anyanwu, Sallyanne Havers, MD  metFORMIN (GLUCOPHAGE) 1000 MG tablet Take 1 tablet (1,000 mg total) by mouth 2 (two) times daily with a meal. 04/09/16  Yes Mikell, Jeani Sow, MD  metoprolol succinate (TOPROL XL) 50 MG 24 hr tablet Take 1 tablet (50 mg total) by mouth daily. Take with or immediately following a meal. 04/30/17  Yes Bacigalupo, Dionne Bucy, MD  VOLTAREN 1 % GEL APPLY 2 G TOPICALLY 4 (FOUR) TIMES DAILY. Patient taking differently: Apply 2 g topically 4 (four) times daily as needed (pain).  01/16/17  Yes Bacigalupo, Dionne Bucy, MD  ferrous sulfate 325 (65 FE) MG EC tablet Take 1 tablet (325 mg total) by mouth 3 (three) times daily with meals. Patient not taking: Reported on 02/19/2017 01/17/17   Zenia Resides, MD  Insulin Pen Needle (B-D ULTRAFINE III SHORT PEN) 31G X 8 MM MISC Use as directed to inject insulin qhs 09/23/17   Verner Mould, MD  Insulin Syringe-Needle U-100 (INSULIN SYRINGE .5CC/30GX1/2") 30G X 1/2" 0.5 ML MISC Use to inject insulin daily as prescribed 01/23/17   Virginia Crews, MD  traMADol (ULTRAM) 50 MG tablet Take 1 tablet (50 mg total) by mouth every 6 (six) hours as needed for severe pain. Patient not taking: Reported on 10/03/2017 01/19/17   Dalia Heading, PA-C    Family History Family History  Problem Relation Age of Onset  . Pneumonia Mother   . Diabetes Mother   . Hypertension Father   . Hypertension Sister   . Hyperlipidemia Sister   .  Hypertension Brother   . Hypertension Maternal Aunt   . Hypertension Paternal Aunt   . Cancer Paternal Aunt        throat cancer  . Diabetes Maternal Grandmother   . Diabetes Paternal Grandmother   . Cancer Paternal Grandmother        pancreatic cancer    Social History Social History   Tobacco Use  . Smoking status: Never Smoker  . Smokeless tobacco: Never Used  Substance Use Topics  . Alcohol use: No    Comment: occasional for special occasions  . Drug use: No     Allergies   Tramadol   Review of Systems Review of Systems  Constitutional: Positive for fatigue. Negative for fever.  Respiratory: Negative for shortness of breath.   Cardiovascular: Positive for chest pain. Negative for syncope.  Gastrointestinal: Negative for vomiting.  Neurological: Negative for syncope.  All other systems reviewed and are negative.    Physical Exam Updated Vital Signs BP (!) 186/95   Pulse (!) 56   Temp (!) 97.2 F (36.2 C) (Oral)   Resp 18   Ht 1.676 m (5\' 6" )   Wt 116.1 kg (256 lb)   SpO2 100%   BMI 41.32 kg/m   Physical Exam CONSTITUTIONAL: Well developed/well nourished HEAD: Normocephalic/atraumatic EYES: EOMI/PERRL ENMT: Mucous membranes moist NECK: supple no meningeal signs SPINE/BACK:entire spine nontender CV: S1/S2 noted, no murmurs/rubs/gallops noted LUNGS: Lungs are clear to auscultation bilaterally, no apparent distress ABDOMEN: soft, nontender, no rebound or guarding, bowel sounds noted throughout abdomen GU:no cva tenderness NEURO: Pt is awake/alert/appropriate, moves all extremitiesx4.  No facial droop.  No arm or leg drift EXTREMITIES: pulses normal/equal, full ROM, no calf tenderness or edema SKIN: warm, color normal PSYCH: no abnormalities of mood noted, alert and oriented to situation   ED Treatments / Results  Labs (all labs ordered are listed, but only abnormal results are displayed) Labs Reviewed  BASIC METABOLIC PANEL - Abnormal; Notable  for the following components:      Result Value   Potassium 3.4 (*)    Glucose, Bld 187 (*)    BUN 21 (*)    Creatinine, Ser 1.21 (*)    Calcium 10.8 (*)    GFR calc non Af Amer 49 (*)    GFR calc Af Amer 56 (*)    All other components within normal limits  CBC - Abnormal; Notable for the following components:   MCV 75.5 (*)    MCH 23.7 (*)    Platelets 439 (*)    All other components within normal limits  I-STAT TROPONIN, ED    EKG  EKG Interpretation  Date/Time:  Thursday October 03 2017 04:00:05 EST Ventricular Rate:  61 PR Interval:  148 QRS Duration: 102 QT Interval:  424 QTC Calculation: 426 R Axis:   3 Text Interpretation:  Normal sinus rhythm Cannot rule out Anterior infarct , age undetermined Abnormal ECG No significant change since last tracing Confirmed by Ripley Fraise [phone]) on 10/03/2017 4:21:47 AM       Radiology Dg Chest 2 View  Result Date: 10/03/2017 CLINICAL DATA:  Chest pain and shortness of breath, intermittent symptoms for 1 week. Nausea. Elevated blood pressure. EXAM: CHEST  2 VIEW COMPARISON:  Radiographs 02/18/2017 FINDINGS: Mild cardiomegaly with tortuous thoracic aorta, unchanged. No pulmonary edema. Subsegmental left basilar atelectasis. No consolidation, pleural effusion or pneumothorax. No acute osseous abnormalities. IMPRESSION: Stable cardiomegaly. No congestive failure or acute pulmonary process. Electronically Signed   By: Jeb Levering M.D.   On: 10/03/2017 04:45    Procedures Procedures (including critical care time)  Medications Ordered in ED Medications  aspirin chewable tablet 324 mg (324 mg Oral Given 10/03/17 0534)     Initial Impression / Assessment and Plan / ED Course  I have reviewed the triage vital signs and the nursing notes.  Pertinent labs & imaging results that were available during my care of the patient were reviewed by me and considered in my medical decision making (see chart for details).     5:14  AM Patient is very well-appearing She admits that her chest pain episodes are very frequent sometimes lasting only 45 seconds Tonight the  episode lasted longer than normal so she went to be evaluated Currently her heart score equals 3 5:35 AM Patient resting comfortably, watching television no distress Plan to monitor and repeat troponin at 730am Suspicion for acute aortic dissection and suspicion for acute PE is very low She was updated on plan and is agreeable 6:56 AM Pt resting comfortably No distress Vitals improved Plan at signout to dr Leonette Monarch, f/u on repeat troponin, which should be drawn at 730am Final Clinical Impressions(s) / ED Diagnoses   Final diagnoses:  None    ED Discharge Orders    None       Ripley Fraise, MD 10/03/17 [phone]

## 2017-10-03 NOTE — Discharge Instructions (Signed)

## 2017-10-07 ENCOUNTER — Ambulatory Visit: Payer: Medicaid Other | Admitting: Internal Medicine

## 2017-11-18 ENCOUNTER — Other Ambulatory Visit: Payer: Self-pay | Admitting: *Deleted

## 2017-11-18 MED ORDER — LOSARTAN POTASSIUM 25 MG PO TABS: 25.0000 mg | ORAL_TABLET | Freq: Every day | ORAL | 3 refills | Status: DC

## 2017-11-25 ENCOUNTER — Other Ambulatory Visit: Payer: Self-pay | Admitting: Obstetrics & Gynecology

## 2017-11-25 DIAGNOSIS — N939 Abnormal uterine and vaginal bleeding, unspecified: Secondary | ICD-10-CM

## 2017-12-16 ENCOUNTER — Emergency Department (HOSPITAL_COMMUNITY)
Admission: EM | Admit: 2017-12-16 | Discharge: 2017-12-16 | Disposition: A | Discharge status: Home / Self Care | Payer: Medicaid Other | Attending: Emergency Medicine | Admitting: Emergency Medicine

## 2017-12-16 ENCOUNTER — Other Ambulatory Visit: Payer: Self-pay

## 2017-12-16 ENCOUNTER — Encounter (HOSPITAL_COMMUNITY): Payer: Self-pay

## 2017-12-16 ENCOUNTER — Emergency Department (HOSPITAL_COMMUNITY): Payer: Medicaid Other

## 2017-12-16 DIAGNOSIS — B9789 Other viral agents as the cause of diseases classified elsewhere: Secondary | ICD-10-CM | POA: Insufficient documentation

## 2017-12-16 DIAGNOSIS — J069 Acute upper respiratory infection, unspecified: Secondary | ICD-10-CM | POA: Insufficient documentation

## 2017-12-16 DIAGNOSIS — I10 Essential (primary) hypertension: Secondary | ICD-10-CM

## 2017-12-16 DIAGNOSIS — Z794 Long term (current) use of insulin: Secondary | ICD-10-CM | POA: Insufficient documentation

## 2017-12-16 DIAGNOSIS — Z7982 Long term (current) use of aspirin: Secondary | ICD-10-CM | POA: Diagnosis not present

## 2017-12-16 DIAGNOSIS — Z79899 Other long term (current) drug therapy: Secondary | ICD-10-CM | POA: Diagnosis not present

## 2017-12-16 DIAGNOSIS — R05 Cough: Secondary | ICD-10-CM | POA: Diagnosis present

## 2017-12-16 DIAGNOSIS — E119 Type 2 diabetes mellitus without complications: Secondary | ICD-10-CM | POA: Insufficient documentation

## 2017-12-16 LAB — RAPID STREP SCREEN (MED CTR MEBANE ONLY): STREPTOCOCCUS, GROUP A SCREEN (DIRECT): NEGATIVE

## 2017-12-16 MED ORDER — GUAIFENESIN ER 1200 MG PO TB12: 1.0000 | ORAL_TABLET | Freq: Two times a day (BID) | ORAL | 1 refills | Status: DC | PRN

## 2017-12-16 MED ORDER — CLONIDINE HCL 0.2 MG PO TABS
0.3000 mg | ORAL_TABLET | Freq: Once | ORAL | Status: AC
  Administered 2017-12-16: 14:00:00 0.3 mg via ORAL
  Filled 2017-12-16: qty 1

## 2017-12-16 MED ORDER — CLONIDINE HCL 0.2 MG PO TABS: 0.3000 mg | ORAL_TABLET | Freq: Two times a day (BID) | ORAL | 0 refills | Status: DC

## 2017-12-16 MED ORDER — BENZONATATE 100 MG PO CAPS: 100.0000 mg | ORAL_CAPSULE | Freq: Three times a day (TID) | ORAL | 0 refills | Status: DC

## 2017-12-16 NOTE — Discharge Instructions (Signed)
Please read and follow all provided instructions.  You were seen here today for your symptoms. Your diagnosis today is Influenza (Flu).   Take medications as prescribed. Followup with your doctor in regards to your hospital visit. If you do not have a doctor use the resource guide listed below to help he find one. You may return to the emergency department if symptoms worsen, become progressive, or become more concerning. Read below to learn more about your diagnosis & reasons to return. Use Tylenol to treat your fever. Use tessalon as needed for cough. Use mucinex for congestion.   Influenza, Adult  Influenza (flu) starts suddenly, usually with a fever. It causes chills, a dry-hacking cough, headache, body aches, and sore throat. Influenza spreads easily from one person to another. Risk of transmission is lowered by getting the flu shot each year.  HOME CARE  Only take medicines as told by your doctor.  Rest.  Drink enough fluids to keep your pee (urine) clear or pale yellow.  Wash your hands often. Do this after you blow your nose, after you go to the bathroom, and before you touch food.  GET HELP RIGHT AWAY IF (return to the ED) You have shortness of breath while resting.  You have pain or pressure in the chest or belly (abdomen).  You suddenly feel dizzy.  You feel confused.  You have a hard time breathing.  Your skin or nails turn bluish in color.  You get a bad neck pain or stiffness.  You get a bad headache, face pain, or earache.  You throw up (vomit) a lot and often.  You have a fever > 101 that persists  Additional Information:  You are also noted to have increased blood pressure during today's visit.  I gave you your home blood pressure medication with improvement of your blood pressure readings in the department.  I will refill this as well.  Please take as prescribed.  Please follow with your PCP this week to discuss your medication regimen.  If you develop any chest pain,  shortness of breath, visual changes, difficulty with speech, weakness on one side of your body please return to the emergency department for evaluation.  Your vital signs today were: BP (!) 147/92    Pulse 83    Temp 98.3 F (36.8 C) (Oral)    Resp (!) 29    Ht 5\' 6"  (1.676 m)    Wt 129.3 kg (285 lb)    SpO2 98%    BMI 46.00 kg/m  If your blood pressure (BP) was elevated above 135/85 this visit, please have this repeated by your doctor within one month. ---------------

## 2017-12-16 NOTE — ED Notes (Signed)
Pt taken to lobby via wheelchair. Pt had no questions upon d/c.

## 2017-12-16 NOTE — ED Provider Notes (Signed)
[facility] EMERGENCY DEPARTMENT Provider Note   CSN: [id number] Arrival date & time: 12/16/17  1148     History   Chief Complaint Chief Complaint  Patient presents with  . Cough    HPI Andrea [name] is a 57 y.o. female with a history of diabetes, hyperlipidemia, hypertension who presents ED today for cough.  Patient notes that over the last 3 days she has had subjective fever, chills, body aches, nasal congestion, sinus pressure, ear fullness, sore throat with dysphasia, and nonproductive cough.  Patient notes that she is worried she may have pneumonia.  She has not taken anything for symptoms.  Nothing makes her symptoms better or worse.  Patient notes that several of her children have been sick without illnesses at home.  She denies any associated chest pain, shortness of breath, nausea, diaphoresis, hemoptysis, abdominal pain, emesis, diarrhea, urinary frequency, urinary urgency, dysuria or lower leg swelling.  Patient did not receive her flu vaccine this year.  HPI  Past Medical History:  Diagnosis Date  . Diabetes mellitus   . Environmental allergies   . Fibroid   . High cholesterol   . Hx of cardiovascular stress test    a. ETT-MV 2/14:  EF 46% (normal by echo), low risk, no ischemia or scar  . Hx of echocardiogram    Echo 2/14: mild LVH, EF 55-60%, Gr 1 diast dysfn, mild LAE  . Hypertension   . Hyperthyroidism    Radioactive iodine ablation    Patient Active Problem List   Diagnosis Date Noted  . Low back pain 01/29/2017  . Cholelithiasis without cholecystitis 01/18/2017  . Anemia, iron deficiency 01/17/2017  . Abdominal pain   . Hyperglycemia   . Increased anion gap metabolic acidosis   . Weight loss 04/09/2016  . Headache 04/09/2016  . Fibroids 04/14/2014  . Abnormal uterine bleeding (AUB) 04/14/2014  . Unequal leg length 04/16/2013  . Lightheadedness 12/17/2012  . Chest pain 12/14/2012  . Morbid obesity ([place]) 12/29/2011  . High  cholesterol   . 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 ANXIETY 02/20/2010  . GERD 01/30/2010  . THYROID NODULE 01/17/2010  . THYROMEGALY 01/16/2010  . DEPRESSION 01/16/2010  . Type II diabetes mellitus ([place]) 09/18/2007  . Essential hypertension, benign 09/18/2007    Past Surgical History:  Procedure Laterality Date  . [place], 2001   x 2    OB History    Gravida Para Term Preterm AB Living   4 3 3   1 3    SAB TAB Ectopic Multiple Live Births   1       3       Home Medications    Prior to Admission medications   Medication Sig Start Date End Date Taking? Authorizing Provider  amLODipine (NORVASC) 10 MG tablet Take 1 tablet (10 mg total) by mouth daily. 06/18/16   Mayo, Pete Pelt, MD  aspirin EC 81 MG tablet Take 81 mg by mouth daily.    [provider]  cloNIDine (CATAPRES) 0.3 MG tablet TAKE 1 TABLET BY MOUTH TWICE A DAY 02/08/17   Truett Mainland, DO  ferrous sulfate 325 (65 FE) MG EC tablet Take 1 tablet (325 mg total) by mouth 3 (three) times daily with meals. Patient not taking: Reported on 02/19/2017 01/17/17   Zenia Resides, MD  gabapentin (NEURONTIN) 300 MG capsule Take 1 capsule (300 mg total) by mouth 3 (three) times daily. Patient taking differently: Take 300 mg  by mouth 3 (three) times daily as needed (nerve pain).  12/26/16   Lysbeth Penner, FNP  hydrochlorothiazide (HYDRODIURIL) 25 MG tablet TAKE 1 TABLET (25 MG TOTAL) BY MOUTH DAILY. 06/03/17   Verner Mould, MD  ibuprofen (ADVIL,MOTRIN) 800 MG tablet Take 1 tablet (800 mg total) by mouth every 8 (eight) hours as needed. Patient taking differently: Take 800 mg by mouth every 8 (eight) hours as needed for headache or mild pain.  01/19/17   Lawyer, Harrell Gave, PA-C  Insulin Glargine (LANTUS SOLOSTAR) 100 UNIT/ML Solostar Pen Inject 27 Units into the skin at bedtime. 01/29/17   Bacigalupo, Dionne Bucy, MD  Insulin Pen Needle (B-D ULTRAFINE III SHORT PEN) 31G X 8 MM MISC Use as  directed to inject insulin qhs 09/23/17   Verner Mould, MD  Insulin Syringe-Needle U-100 (INSULIN SYRINGE .5CC/30GX1/2") 30G X 1/2" 0.5 ML MISC Use to inject insulin daily as prescribed 01/23/17   Virginia Crews, MD  losartan (COZAAR) 25 MG tablet Take 1 tablet (25 mg total) by mouth daily. 11/18/17   Verner Mould, MD  megestrol (MEGACE) 40 MG tablet TAKE 2 TABLETS BY MOUTH TWICE A DAY , MAY INCREASE TO 3 TABLETS TWICE DAILY FOR HEAVY BLEEDING 11/26/17   Anyanwu, Sallyanne Havers, MD  metFORMIN (GLUCOPHAGE) 1000 MG tablet Take 1 tablet (1,000 mg total) by mouth 2 (two) times daily with a meal. 04/09/16   Mikell, Jeani Sow, MD  metoprolol succinate (TOPROL XL) 50 MG 24 hr tablet Take 1 tablet (50 mg total) by mouth daily. Take with or immediately following a meal. 04/30/17   Bacigalupo, Dionne Bucy, MD  traMADol (ULTRAM) 50 MG tablet Take 1 tablet (50 mg total) by mouth every 6 (six) hours as needed for severe pain. Patient not taking: Reported on 10/03/2017 01/19/17   Lawyer, Harrell Gave, PA-C  VOLTAREN 1 % GEL APPLY 2 G TOPICALLY 4 (FOUR) TIMES DAILY. Patient taking differently: Apply 2 g topically 4 (four) times daily as needed (pain).  01/16/17   Virginia Crews, MD  sertraline (ZOLOFT) 50 MG tablet Take 50 mg by mouth daily.  01/19/12  [provider]    Family History Family History  Problem Relation Age of Onset  . Pneumonia Mother   . Diabetes Mother   . Hypertension Father   . Hypertension Sister   . Hyperlipidemia Sister   . Hypertension Brother   . Hypertension Maternal Aunt   . Hypertension Paternal Aunt   . Cancer Paternal Aunt        throat cancer  . Diabetes Maternal Grandmother   . Diabetes Paternal Grandmother   . Cancer Paternal Grandmother        pancreatic cancer    Social History Social History   Tobacco Use  . Smoking status: Never Smoker  . Smokeless tobacco: Never Used  Substance Use Topics  . Alcohol use: No    Comment:  occasional for special occasions  . Drug use: No     Allergies   Tramadol   Review of Systems Review of Systems  All other systems reviewed and are negative.    Physical Exam Updated Vital Signs BP (!) 180/100 (BP Location: Right Arm)   Pulse 83   Temp 98.3 F (36.8 C) (Oral)   Resp (!) 30   Ht 5\' 6"  (1.676 m)   Wt 129.3 kg (285 lb)   SpO2 98%   BMI 46.00 kg/m   Physical Exam  Constitutional: She appears well-developed and well-nourished.  HENT:  Head: Normocephalic and atraumatic.  Right Ear: Tympanic membrane and external ear normal.  Left Ear: Tympanic membrane and external ear normal.  Nose: Mucosal edema present. Right sinus exhibits maxillary sinus tenderness. Right sinus exhibits no frontal sinus tenderness. Left sinus exhibits maxillary sinus tenderness. Left sinus exhibits no frontal sinus tenderness.  Mouth/Throat: Uvula is midline, oropharynx is clear and moist and mucous membranes are normal. No tonsillar exudate.  The patient has normal phonation and is in control of secretions. No stridor.  Midline uvula without edema. Soft palate rises symmetrically.  No tonsillar erythema or exudates. No PTA. Tongue protrusion is normal. No trismus. No creptius on neck palpation and patient has good dentition. No gingival erythema or fluctuance noted. Mucus membranes moist.   Eyes: Pupils are equal, round, and reactive to light. Right eye exhibits no discharge. Left eye exhibits no discharge. No scleral icterus.  Neck: Trachea normal. Neck supple. No spinous process tenderness present. No neck rigidity. Normal range of motion present.  No nuchal rigidity or meningismus  Cardiovascular: Normal rate, regular rhythm and intact distal pulses.  No murmur heard. Pulses:      Radial pulses are 2+ on the right side, and 2+ on the left side.       Dorsalis pedis pulses are 2+ on the right side, and 2+ on the left side.       Posterior tibial pulses are 2+ on the right side, and 2+  on the left side.  No lower extremity swelling or edema. Calves symmetric in size bilaterally.  Pulmonary/Chest: Effort normal and breath sounds normal. She exhibits no tenderness.  No increased work of breathing. No accessory muscle use. Patient is sitting upright, speaking in full sentences without difficulty   Abdominal: Soft. Bowel sounds are normal. There is no tenderness. There is no rebound and no guarding.  Musculoskeletal: She exhibits no edema.  Lymphadenopathy:    She has no cervical adenopathy.  Neurological: She is alert.  Speech clear. Follows commands. No facial droop. PERRLA. EOM grossly intact. CN III-XII grossly intact. Grossly moves all extremities 4 without ataxia. Able and appropriate strength for age to upper and lower extremities bilaterally. Normal finger to nose bilaterally.   Skin: Skin is warm and dry. Capillary refill takes less than 2 seconds. No rash noted. She is not diaphoretic.  No petechiae or purpura rash  Psychiatric: She has a normal mood and affect.  Nursing note and vitals reviewed.    ED Treatments / Results  Labs (all labs ordered are listed, but only abnormal results are displayed) Labs Reviewed - No data to display  EKG  EKG Interpretation None       Radiology Dg Chest 2 View  Result Date: 12/16/2017 CLINICAL DATA:  Productive cough and fever since 12/12/2017. EXAM: CHEST  2 VIEW COMPARISON:  10/03/2017 FINDINGS: There is chronic cardiomegaly with tortuosity of the thoracic aorta. Pulmonary vascularity is normal and the lungs are clear. No effusions. No significant bone abnormality. IMPRESSION: No acute abnormality.  Chronic cardiomegaly. Electronically Signed   By: Lorriane Shire M.D.   On: 12/16/2017 13:04    Procedures Procedures (including critical care time)  Medications Ordered in ED Medications  cloNIDine (CATAPRES) tablet 0.3 mg (not administered)     Initial Impression / Assessment and Plan / ED Course  I have reviewed  the triage vital signs and the nursing notes.  Pertinent labs & imaging results that were available during my care of the patient were reviewed  by me and considered in my medical decision making (see chart for details).     57 y.o. with 3 days she has had subjective fever, chills, body aches, nasal congestion, sinus pressure, ear fullness, sore throat with dysphasia, and nonproductive cough. No SOB, CP, lower leg swelling or hemoptysis. Sick contacts at home. Did not receive flu vaccine.  On presentation she is without fever.  Pt CXR negative for acute infiltrate, increasing cardiomegaly, pulmonary edema or pleural effusion.  Chronic cardiomegaly noted.  Strep test negative.  Patients symptoms are consistent with URI versus influenza-like illness, likely viral etiology. Discussed that antibiotics are not indicated for viral infections.  [place] versus benefits of Tamiflu prescription.  Patient is outside of the 24-48-hour window.  She elected to not receive Tamiflu treatment.  Pt will be discharged with symptomatic treatment.  Verbalizes understanding and is agreeable with plan. Pt is hemodynamically stable & in NAD prior to dc.  Patient was noted to have an elevated blood pressure during their stay in the emergency department. The patient has a history of high blood pressure.  No neurologic symptoms. No chest pain or shortness of breath. Patient notes she has been out of her home clonidine for several weeks. Has been taking all other BP medications as directed. This was given in the department with improvement of BP. Will refill medication at time of discharge. I advised the patient to discuss this with their PCP during her follow up visit to decide if medication management is needed for this.   Vitals:   12/16/17 1230 12/16/17 1315 12/16/17 1331 12/16/17 1430  BP: (!) 195/90 (!) 190/118 (!) 180/100 (!) 147/92  Pulse: 83     Resp: (!) 32 (!) 30  (!) 29  Temp:      TempSrc:      SpO2: 98%       Weight:      Height:         Final Clinical Impressions(s) / ED Diagnoses   Final diagnoses:  Viral URI with cough  Hypertension, unspecified type    ED Discharge Orders        Ordered    cloNIDine (CATAPRES) 0.2 MG tablet  2 times daily     12/16/17 1523    benzonatate (TESSALON) 100 MG capsule  Every 8 hours     12/16/17 1523    Guaifenesin (MUCINEX MAXIMUM STRENGTH) 1200 MG TB12  Every 12 hours PRN     12/16/17 1523       Lorelle Gibbs 12/16/17 1524    Blanchie Dessert, MD 12/17/17 1610

## 2017-12-16 NOTE — ED Notes (Signed)
Hooked patient up to the monitor into a gown patient is resting with call bell in reach 

## 2017-12-16 NOTE — ED Triage Notes (Signed)
Per Pt, Pt is reporting cough, congestion, sore throat x 3 days. Hx of the same in the past couple months that is recurring. Reports some fevers and bilateral ear pain.

## 2017-12-19 LAB — CULTURE, GROUP A STREP (THRC)

## 2018-01-15 ENCOUNTER — Other Ambulatory Visit: Payer: Self-pay | Admitting: Internal Medicine

## 2018-01-15 DIAGNOSIS — E1165 Type 2 diabetes mellitus with hyperglycemia: Secondary | ICD-10-CM

## 2018-01-15 DIAGNOSIS — Z794 Long term (current) use of insulin: Principal | ICD-10-CM

## 2018-01-23 ENCOUNTER — Emergency Department (HOSPITAL_COMMUNITY)
Admission: EM | Admit: 2018-01-23 | Discharge: 2018-01-23 | Disposition: A | Discharge status: Home / Self Care | Payer: Medicaid Other | Attending: Emergency Medicine | Admitting: Emergency Medicine

## 2018-01-23 ENCOUNTER — Other Ambulatory Visit: Payer: Self-pay

## 2018-01-23 ENCOUNTER — Encounter (HOSPITAL_COMMUNITY): Payer: Self-pay

## 2018-01-23 DIAGNOSIS — N390 Urinary tract infection, site not specified: Secondary | ICD-10-CM | POA: Insufficient documentation

## 2018-01-23 DIAGNOSIS — E1165 Type 2 diabetes mellitus with hyperglycemia: Secondary | ICD-10-CM | POA: Diagnosis not present

## 2018-01-23 DIAGNOSIS — Z79899 Other long term (current) drug therapy: Secondary | ICD-10-CM | POA: Diagnosis not present

## 2018-01-23 DIAGNOSIS — D649 Anemia, unspecified: Secondary | ICD-10-CM

## 2018-01-23 DIAGNOSIS — R319 Hematuria, unspecified: Secondary | ICD-10-CM | POA: Diagnosis not present

## 2018-01-23 DIAGNOSIS — R1032 Left lower quadrant pain: Secondary | ICD-10-CM | POA: Diagnosis present

## 2018-01-23 DIAGNOSIS — Z7982 Long term (current) use of aspirin: Secondary | ICD-10-CM | POA: Diagnosis not present

## 2018-01-23 DIAGNOSIS — I10 Essential (primary) hypertension: Secondary | ICD-10-CM | POA: Insufficient documentation

## 2018-01-23 DIAGNOSIS — R109 Unspecified abdominal pain: Secondary | ICD-10-CM

## 2018-01-23 DIAGNOSIS — Z794 Long term (current) use of insulin: Secondary | ICD-10-CM | POA: Diagnosis not present

## 2018-01-23 DIAGNOSIS — N12 Tubulo-interstitial nephritis, not specified as acute or chronic: Secondary | ICD-10-CM | POA: Diagnosis not present

## 2018-01-23 LAB — URINALYSIS, ROUTINE W REFLEX MICROSCOPIC
Bilirubin Urine: NEGATIVE
Ketones, ur: NEGATIVE mg/dL
NITRITE: NEGATIVE
PH: 5 (ref 5.0–8.0)
Protein, ur: 100 mg/dL — AB
SPECIFIC GRAVITY, URINE: 1.02 (ref 1.005–1.030)

## 2018-01-23 LAB — COMPREHENSIVE METABOLIC PANEL
ALK PHOS: 92 U/L (ref 38–126)
ALT: 11 U/L — AB (ref 14–54)
AST: 14 U/L — ABNORMAL LOW (ref 15–41)
Albumin: 3 g/dL — ABNORMAL LOW (ref 3.5–5.0)
Anion gap: 11 (ref 5–15)
BUN: 19 mg/dL (ref 6–20)
CALCIUM: 10.3 mg/dL (ref 8.9–10.3)
CO2: 21 mmol/L — ABNORMAL LOW (ref 22–32)
CREATININE: 1.69 mg/dL — AB (ref 0.44–1.00)
Chloride: 97 mmol/L — ABNORMAL LOW (ref 101–111)
GFR, EST AFRICAN AMERICAN: 38 mL/min — AB (ref >60)
GFR, EST NON AFRICAN AMERICAN: 33 mL/min — AB (ref >60)
Glucose, Bld: 460 mg/dL — ABNORMAL HIGH (ref 65–99)
Potassium: 4.1 mmol/L (ref 3.5–5.1)
Sodium: 129 mmol/L — ABNORMAL LOW (ref 135–145)
Total Bilirubin: 0.8 mg/dL (ref 0.3–1.2)
Total Protein: 7.8 g/dL (ref 6.5–8.1)

## 2018-01-23 LAB — CBC WITH DIFFERENTIAL/PLATELET
Basophils Absolute: 0 10*3/uL (ref 0.0–0.1)
Basophils Relative: 0 %
EOS PCT: 0 %
Eosinophils Absolute: 0 10*3/uL (ref 0.0–0.7)
HCT: 33.6 % — ABNORMAL LOW (ref 36.0–46.0)
Hemoglobin: 11 g/dL — ABNORMAL LOW (ref 12.0–15.0)
LYMPHS ABS: 1.8 10*3/uL (ref 0.7–4.0)
LYMPHS PCT: 19 %
MCH: 24.6 pg — AB (ref 26.0–34.0)
MCHC: 32.7 g/dL (ref 30.0–36.0)
MCV: 75 fL — AB (ref 78.0–100.0)
MONOS PCT: 11 %
Monocytes Absolute: 1 10*3/uL (ref 0.1–1.0)
Neutro Abs: 6.5 10*3/uL (ref 1.7–7.7)
Neutrophils Relative %: 70 %
PLATELETS: 319 10*3/uL (ref 150–400)
RBC: 4.48 MIL/uL (ref 3.87–5.11)
RDW: 15.5 % (ref 11.5–15.5)
WBC: 9.3 10*3/uL (ref 4.0–10.5)

## 2018-01-23 LAB — LIPASE, BLOOD: LIPASE: 41 U/L (ref 11–51)

## 2018-01-23 MED ORDER — KETOROLAC TROMETHAMINE 30 MG/ML IJ SOLN
15.0000 mg | Freq: Once | INTRAMUSCULAR | Status: AC
  Administered 2018-01-23: 15 mg via INTRAVENOUS
  Filled 2018-01-23: qty 1

## 2018-01-23 MED ORDER — SODIUM CHLORIDE 0.9 % IV SOLN
1.0000 g | Freq: Once | INTRAVENOUS | Status: AC
  Administered 2018-01-23: 1 g via INTRAVENOUS
  Filled 2018-01-23: qty 10

## 2018-01-23 MED ORDER — SODIUM CHLORIDE 0.9 % IV BOLUS (SEPSIS)
2000.0000 mL | Freq: Once | INTRAVENOUS | Status: AC
  Administered 2018-01-23: 2000 mL via INTRAVENOUS

## 2018-01-23 MED ORDER — CEPHALEXIN 500 MG PO CAPS: 500.0000 mg | ORAL_CAPSULE | Freq: Three times a day (TID) | ORAL | 0 refills | Status: AC

## 2018-01-23 MED ORDER — SODIUM CHLORIDE 0.9 % IV BOLUS (SEPSIS): 1000.0000 mL | Freq: Once | INTRAVENOUS | Status: DC

## 2018-01-23 NOTE — Discharge Instructions (Signed)
Stay very well hydrated with plenty of water throughout the day. Take antibiotic until completed. Alternate between tylenol and motrin as needed for pain. Follow up with your primary care physician in 5-7 days for recheck of ongoing symptoms but return to ER for emergent changing or worsening of symptoms. Please seek immediate care if you develop the following: You develop worsening back pain.  Your symptoms are no better, or worse in 3 days. There is severe back pain or lower abdominal pain.  You develop chills.  You have a fever.  There is nausea or vomiting.  There is continued burning or discomfort with urination.

## 2018-01-23 NOTE — ED Notes (Signed)
ED Provider at bedside. 

## 2018-01-23 NOTE — ED Triage Notes (Signed)
Pt reports left side flank pain X4 days. States she has had frequent urination. Denies burning with urination but does report odor. Pt afebrile. Denies nausea or vomiting.

## 2018-01-23 NOTE — ED Provider Notes (Signed)
[facility]ER[facility]ENC[facility] DEPARTMENT Provider Note   CSN: [id number] Arrival date & time: 01/23/18  1033     History   Chief Complaint Chief Complaint  Patient presents with  . Flank Pain    HPI Andrea [name] is a 57 y.o. female with a PMHx of DM2, HLD, HTN, hyperthyroidism, and other conditions listed below, who presents to the ED with complaints of left flank pain that began 4 days ago.  She describes the pain as 8/10 constant aching and sharp left flank pain that has now started radiating into the right flank and suprapubic abdominal area, worse with activity and movement, and unrelieved with ibuprofen.  She reports associated increased urinary frequency and urgency as well as malodorous urine.  She has had a UTI in the past and she thinks this feels somewhat similar but she cannot remember.  She denies any history of kidney stones that she is aware of.  She denies fevers, chills, CP, SOB, nausea/vomiting, diarrhea/constipation, melena, hematochezia, hematuria, dysuria, vaginal bleeding/discharge, myalgias, arthralgias, numbness, tingling, focal weakness, or any other complaints at this time. Denies recent travel, sick contacts, suspicious food intake, EtOH use, NSAID use, or prior abd surgeries.  She is perimenopausal and denies possibility of pregnancy at this time.    The history is provided by the patient and medical records. No language interpreter was used.  Flank Pain  This is a new problem. The current episode started more than 2 days ago. The problem occurs constantly. The problem has not changed since onset.Associated symptoms include abdominal pain (from flanks). Pertinent negatives include no chest pain and no shortness of breath. Exacerbated by: activity and movement. Nothing relieves the symptoms. Treatments tried: ibuprofen. The treatment provided no relief.    Past Medical History:  Diagnosis Date  . Diabetes mellitus   . Environmental allergies   .  Fibroid   . High cholesterol   . Hx of cardiovascular stress test    a. ETT-MV 2/14:  EF 46% (normal by echo), low risk, no ischemia or scar  . Hx of echocardiogram    Echo 2/14: mild LVH, EF 55-60%, Gr 1 diast dysfn, mild LAE  . Hypertension   . Hyperthyroidism    Radioactive iodine ablation    Patient Active Problem List   Diagnosis Date Noted  . Low back pain 01/29/2017  . Cholelithiasis without cholecystitis 01/18/2017  . Anemia, iron deficiency 01/17/2017  . Abdominal pain   . Hyperglycemia   . Increased anion gap metabolic acidosis   . Weight loss 04/09/2016  . Headache 04/09/2016  . Fibroids 04/14/2014  . Abnormal uterine bleeding (AUB) 04/14/2014  . Unequal leg length 04/16/2013  . Lightheadedness 12/17/2012  . Chest pain 12/14/2012  . Morbid obesity ([place]) 12/29/2011  . High cholesterol   . 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 ANXIETY 02/20/2010  . GERD 01/30/2010  . THYROID NODULE 01/17/2010  . THYROMEGALY 01/16/2010  . DEPRESSION 01/16/2010  . Type II diabetes mellitus ([place]) 09/18/2007  . Essential hypertension, benign 09/18/2007    Past Surgical History:  Procedure Laterality Date  . [place]   x 2    OB History    Gravida  4   Para  3   Term  3   Preterm      AB  1   Living  3     SAB  1   TAB      Ectopic      Multiple  Live Births  3            Home Medications    Prior to Admission medications   Medication Sig Start Date End Date Taking? Authorizing Provider  amLODipine (NORVASC) 10 MG tablet Take 1 tablet (10 mg total) by mouth daily. 06/18/16   Mayo, Pete Pelt, MD  aspirin EC 81 MG tablet Take 81 mg by mouth daily.    [provider]  benzonatate (TESSALON) 100 MG capsule Take 1 capsule (100 mg total) by mouth every 8 (eight) hours. 12/16/17   Maczis, Barth Kirks, PA-C  cloNIDine (CATAPRES) 0.2 MG tablet Take 1.5 tablets (0.3 mg total) by mouth 2 (two) times daily. 12/16/17   Maczis,  Barth Kirks, PA-C  ferrous sulfate 325 (65 FE) MG EC tablet Take 1 tablet (325 mg total) by mouth 3 (three) times daily with meals. 01/17/17   Zenia Resides, MD  gabapentin (NEURONTIN) 300 MG capsule Take 1 capsule (300 mg total) by mouth 3 (three) times daily. Patient taking differently: Take 300 mg by mouth 3 (three) times daily as needed (nerve pain).  12/26/16   Lysbeth Penner, FNP  Guaifenesin (MUCINEX MAXIMUM STRENGTH) 1200 MG TB12 Take 1 tablet (1,200 mg total) by mouth every 12 (twelve) hours as needed. 12/16/17   Maczis, Barth Kirks, PA-C  hydrochlorothiazide (HYDRODIURIL) 25 MG tablet TAKE 1 TABLET (25 MG TOTAL) BY MOUTH DAILY. 06/03/17   Verner Mould, MD  ibuprofen (ADVIL,MOTRIN) 800 MG tablet Take 1 tablet (800 mg total) by mouth every 8 (eight) hours as needed. Patient taking differently: Take 800 mg by mouth every 8 (eight) hours as needed for headache or mild pain.  01/19/17   Lawyer, Harrell Gave, PA-C  Insulin Glargine (LANTUS SOLOSTAR) 100 UNIT/ML Solostar Pen Inject 27 Units into the skin at bedtime. 01/29/17   Virginia Crews, MD  Insulin Pen Needle (B-D ULTRAFINE III SHORT PEN) 31G X 8 MM MISC USE AS DIRECTED TO INJECT INSULIN AT BEDTIME 01/15/18   Verner Mould, MD  Insulin Syringe-Needle U-100 (INSULIN SYRINGE .5CC/30GX1/2") 30G X 1/2" 0.5 ML MISC Use to inject insulin daily as prescribed 01/23/17   Virginia Crews, MD  losartan (COZAAR) 25 MG tablet Take 1 tablet (25 mg total) by mouth daily. 11/18/17   Verner Mould, MD  megestrol (MEGACE) 40 MG tablet TAKE 2 TABLETS BY MOUTH TWICE A DAY , MAY INCREASE TO 3 TABLETS TWICE DAILY FOR HEAVY BLEEDING 11/26/17   Anyanwu, Sallyanne Havers, MD  metFORMIN (GLUCOPHAGE) 1000 MG tablet Take 1 tablet (1,000 mg total) by mouth 2 (two) times daily with a meal. 04/09/16   Mikell, Jeani Sow, MD  metoprolol succinate (TOPROL XL) 50 MG 24 hr tablet Take 1 tablet (50 mg total) by mouth daily. Take with or  immediately following a meal. 04/30/17   Bacigalupo, Dionne Bucy, MD  VOLTAREN 1 % GEL APPLY 2 G TOPICALLY 4 (FOUR) TIMES DAILY. Patient taking differently: Apply 2 g topically 4 (four) times daily as needed (pain).  01/16/17   Virginia Crews, MD  sertraline (ZOLOFT) 50 MG tablet Take 50 mg by mouth daily.  01/19/12  [provider]    Family History Family History  Problem Relation Age of Onset  . Pneumonia Mother   . Diabetes Mother   . Hypertension Father   . Hypertension Sister   . Hyperlipidemia Sister   . Hypertension Brother   . Hypertension Maternal Aunt   . Hypertension Paternal Aunt   .  Cancer Paternal Aunt        throat cancer  . Diabetes Maternal Grandmother   . Diabetes Paternal Grandmother   . Cancer Paternal Grandmother        pancreatic cancer    Social History Social History   Tobacco Use  . Smoking status: Never Smoker  . Smokeless tobacco: Never Used  Substance Use Topics  . Alcohol use: No    Comment: occasional for special occasions  . Drug use: No     Allergies   Tramadol   Review of Systems Review of Systems  Constitutional: Negative for chills and fever.  Respiratory: Negative for shortness of breath.   Cardiovascular: Negative for chest pain.  Gastrointestinal: Positive for abdominal pain (from flanks). Negative for blood in stool, constipation, diarrhea, nausea and vomiting.  Genitourinary: Positive for flank pain, frequency and urgency. Negative for dysuria, hematuria, vaginal bleeding and vaginal discharge.       +malodorous urine  Musculoskeletal: Negative for arthralgias and myalgias.  Skin: Negative for color change.  Allergic/Immunologic: Positive for immunocompromised state (DM2).  Neurological: Negative for weakness and numbness.  Psychiatric/Behavioral: Negative for confusion.   All other systems reviewed and are negative for acute change except as noted in the HPI.    Physical Exam Updated Vital Signs BP 119/84  (BP Location: Right Arm)   Pulse 94   Temp 98.2 F (36.8 C) (Oral)   Resp 16   Ht 5\' 6"  (1.676 m)   Wt 119.3 kg (263 lb)   SpO2 100%   BMI 42.45 kg/m   Physical Exam  Constitutional: She is oriented to person, place, and time. Vital signs are normal. She appears well-developed and well-nourished.  Non-toxic appearance. No distress.  Afebrile, nontoxic, NAD  HENT:  Head: Normocephalic and atraumatic.  Mouth/Throat: Oropharynx is clear and moist and mucous membranes are normal.  Eyes: Conjunctivae and EOM are normal. Right eye exhibits no discharge. Left eye exhibits no discharge.  Neck: Normal range of motion. Neck supple.  Cardiovascular: Normal rate, regular rhythm, normal heart sounds and intact distal pulses. Exam reveals no gallop and no friction rub.  No murmur heard. Pulmonary/Chest: Effort normal and breath sounds normal. No respiratory distress. She has no decreased breath sounds. She has no wheezes. She has no rhonchi. She has no rales.  Abdominal: Soft. Normal appearance and bowel sounds are normal. She exhibits no distension. There is tenderness in the right lower quadrant, suprapubic area and left lower quadrant. There is CVA tenderness. There is no rigidity, no rebound, no guarding, no tenderness at McBurney's point and negative Murphy's sign.    Soft, obese but nondistended, +BS throughout, with mild suprapubic TTP which tracks along the lower abdomen and into the flanks bilaterally, no r/g/r, neg murphy's, neg mcburney's, with mild b/l CVA TTP   Musculoskeletal: Normal range of motion.  Neurological: She is alert and oriented to person, place, and time. She has normal strength. No sensory deficit.  Skin: Skin is warm, dry and intact. No rash noted.  Psychiatric: She has a normal mood and affect.  Nursing note and vitals reviewed.    ED Treatments / Results  Labs (all labs ordered are listed, but only abnormal results are displayed) Labs Reviewed  URINALYSIS,  ROUTINE W REFLEX MICROSCOPIC - Abnormal; Notable for the following components:      Result Value   Color, Urine AMBER (*)    APPearance CLOUDY (*)    Glucose, UA >=500 (*)    Hgb urine  dipstick LARGE (*)    Protein, ur 100 (*)    Leukocytes, UA LARGE (*)    Bacteria, UA MANY (*)    Squamous Epithelial / LPF 6-30 (*)    All other components within normal limits  CBC WITH DIFFERENTIAL/PLATELET - Abnormal; Notable for the following components:   Hemoglobin 11.0 (*)    HCT 33.6 (*)    MCV 75.0 (*)    MCH 24.6 (*)    All other components within normal limits  COMPREHENSIVE METABOLIC PANEL - Abnormal; Notable for the following components:   Sodium 129 (*)    Chloride 97 (*)    CO2 21 (*)    Glucose, Bld 460 (*)    Creatinine, Ser 1.69 (*)    Albumin 3.0 (*)    AST 14 (*)    ALT 11 (*)    GFR calc non Af Amer 33 (*)    GFR calc Af Amer 38 (*)    All other components within normal limits  URINE CULTURE  LIPASE, BLOOD    EKG  EKG Interpretation None       Radiology No results found.  Procedures Procedures (including critical care time)  Medications Ordered in ED Medications  ketorolac (TORADOL) 30 MG/ML injection 15 mg (15 mg Intravenous Given 01/23/18 1342)  cefTRIAXone (ROCEPHIN) 1 g in sodium chloride 0.9 % 100 mL IVPB (0 g Intravenous Stopped 01/23/18 1454)  sodium chloride 0.9 % bolus 2,000 mL (2,000 mLs Intravenous New Bag/Given 01/23/18 1459)     Initial Impression / Assessment and Plan / ED Course  I have reviewed the triage vital signs and the nursing notes.  Pertinent labs & imaging results that were available during my care of the patient were reviewed by me and considered in my medical decision making (see chart for details).     57 y.o. female here with L flank pain x4 days that has now become b/l flank pain and traveling into the lower abdomen. On exam, mild suprapubic TTP which tracks into the lower abdomen and towards the flanks bilaterally, mild CVA  TTP bilaterally, nonperitoneal otherwise and afebrile/nontoxic. Work up thus far reveals: U/A with large hgb, large leuks, TNTC RBCs and WBCs, many bacteria, does appear contaminated with 6-30 squamous however WBC clumps present as well. Seems most consistent with UTI, likely early pyelo. Will check basic labs to ensure preserved kidney function and no significant leukocytosis; doubt kidney stone at this point, but if kidney function significantly altered then may need to consider stone as a cause, however felt unlikely at this point; will hold off on imaging for now. Will give low-dose toradol for now until we know her kidney function, and treat with rocephin; will send for UCx as well. Will reassess shortly.   2:51 PM CBC w/diff with no leukocytosis, mild chronic stable microcytic anemia. CMP with mildly bumped Cr 1.69 up from recent baseline of 1.1-1.2; also with glucose 460 and pseudohyponatremia from the hyperglycemia; will give fluids, but doubt kidney stone given this minimal bump in Cr. Lipase WNL. Pt feeling better. Will give fluids and then likely d/c. Symptoms most consistent with UTI/early pyelo, will treat for this. Advised tylenol/motrin for pain, adequate hydration, and will send home with abx. Discussed f/up with PCP in 5-7 days for recheck of symptoms. Will recheck after fluids.  4:30 PM Pt continues to feel improved. Will d/c home with previously outlined plan. I explained the diagnosis and have given explicit precautions to return to the ER  including for any other new or worsening symptoms. The patient understands and accepts the medical plan as it's been dictated and I have answered their questions. Discharge instructions concerning home care and prescriptions have been given. The patient is STABLE and is discharged to home in good condition.    Final Clinical Impressions(s) / ED Diagnoses   Final diagnoses:  Left flank pain  Pyelonephritis  Urinary tract infection with hematuria,  site unspecified  Chronic anemia  Type 2 diabetes mellitus with hyperglycemia, with long-term current use of insulin [facility])    ED Discharge Orders        Ordered    cephALEXin (KEFLEX) 500 MG capsule  3 times daily     01/23/18 [address] [place], [place] 01/23/18 1630    Mabe, Forbes Cellar, MD 01/23/18 [phone]

## 2018-01-23 NOTE — ED Notes (Signed)
Pt ambulatory to room with slow, steady gait. Changing into gown at this time

## 2018-01-29 ENCOUNTER — Other Ambulatory Visit: Payer: Self-pay | Admitting: Internal Medicine

## 2018-01-29 MED ORDER — ATORVASTATIN CALCIUM 20 MG PO TABS: 20.0000 mg | ORAL_TABLET | Freq: Every day | ORAL | 3 refills | Status: DC

## 2018-04-21 ENCOUNTER — Other Ambulatory Visit: Payer: Self-pay | Admitting: Family Medicine

## 2018-04-21 DIAGNOSIS — Z794 Long term (current) use of insulin: Principal | ICD-10-CM

## 2018-04-21 DIAGNOSIS — E1165 Type 2 diabetes mellitus with hyperglycemia: Secondary | ICD-10-CM

## 2018-04-21 NOTE — Telephone Encounter (Signed)
Will forward to PCP  Bacigalupo, Dionne Bucy, MD, MPH [place] 04/21/2018 9:46 AM

## 2018-04-26 ENCOUNTER — Other Ambulatory Visit: Payer: Self-pay | Admitting: Family Medicine

## 2018-04-26 DIAGNOSIS — I10 Essential (primary) hypertension: Secondary | ICD-10-CM

## 2018-04-26 DIAGNOSIS — E1165 Type 2 diabetes mellitus with hyperglycemia: Secondary | ICD-10-CM

## 2018-04-26 DIAGNOSIS — Z794 Long term (current) use of insulin: Principal | ICD-10-CM

## 2018-04-28 MED ORDER — METOPROLOL SUCCINATE ER 50 MG PO TB24: 50.0000 mg | ORAL_TABLET | Freq: Every day | ORAL | 3 refills | Status: DC

## 2018-04-28 NOTE — Telephone Encounter (Signed)
Will forward to PCP 

## 2018-05-16 ENCOUNTER — Other Ambulatory Visit: Payer: Self-pay | Admitting: Critical Care Medicine

## 2018-05-16 DIAGNOSIS — E119 Type 2 diabetes mellitus without complications: Secondary | ICD-10-CM

## 2018-05-16 DIAGNOSIS — I10 Essential (primary) hypertension: Secondary | ICD-10-CM

## 2018-05-16 DIAGNOSIS — E1165 Type 2 diabetes mellitus with hyperglycemia: Secondary | ICD-10-CM

## 2018-05-16 DIAGNOSIS — Z794 Long term (current) use of insulin: Secondary | ICD-10-CM

## 2018-05-16 LAB — GLUCOSE, POCT (MANUAL RESULT ENTRY): POC GLUCOSE: 329 mg/dL — AB (ref 70–99)

## 2018-05-16 MED ORDER — HYDROCHLOROTHIAZIDE 25 MG PO TABS: ORAL_TABLET | ORAL | 2 refills | Status: DC

## 2018-05-16 MED ORDER — METFORMIN HCL 1000 MG PO TABS: 1000.0000 mg | ORAL_TABLET | Freq: Two times a day (BID) | ORAL | 3 refills | Status: DC

## 2018-05-16 MED ORDER — INSULIN GLARGINE 100 UNIT/ML SOLOSTAR PEN: PEN_INJECTOR | SUBCUTANEOUS | 3 refills | Status: DC

## 2018-05-16 MED ORDER — LOSARTAN POTASSIUM 25 MG PO TABS: 25.0000 mg | ORAL_TABLET | Freq: Every day | ORAL | 3 refills | Status: DC

## 2018-05-16 MED ORDER — METOPROLOL SUCCINATE ER 50 MG PO TB24: 50.0000 mg | ORAL_TABLET | Freq: Every day | ORAL | 3 refills | Status: DC

## 2018-05-16 MED ORDER — AMLODIPINE BESYLATE 10 MG PO TABS: 10.0000 mg | ORAL_TABLET | Freq: Every day | ORAL | 3 refills | Status: DC

## 2018-05-16 MED ORDER — CLONIDINE HCL 0.2 MG PO TABS: 0.3000 mg | ORAL_TABLET | Freq: Two times a day (BID) | ORAL | 0 refills | Status: DC

## 2018-05-16 MED FILL — metFORMIN HCL 1000 MG TABS: 1000 | 30 days supply | Qty: 60 | Fill #0

## 2018-05-16 MED FILL — HYDROCHLOROTHIAZIDE 25 MG T: 25 | 30 days supply | Qty: 30 | Fill #0

## 2018-05-16 MED FILL — METOPROLOL SUCCINATE ER 50: 50 | 30 days supply | Qty: 30 | Fill #0

## 2018-05-16 MED FILL — LANTUS SOLOSTAR 100 UNITS/M: 100 | 22 days supply | Qty: 6 | Fill #0

## 2018-05-16 MED FILL — AMLODIPINE BESYLATE 10 MG T: 10 | 30 days supply | Qty: 30 | Fill #0

## 2018-05-16 MED FILL — LOSARTAN POTASSIUM 25 MG TA: 25 | 30 days supply | Qty: 30 | Fill #0

## 2018-05-16 NOTE — Progress Notes (Unsigned)
Pt seen at [facility].   Needs refills.    Plan   Refill all bp meds and DM meds  Andrea [name]

## 2018-05-22 ENCOUNTER — Other Ambulatory Visit: Payer: Self-pay | Admitting: Internal Medicine

## 2018-05-22 DIAGNOSIS — I10 Essential (primary) hypertension: Secondary | ICD-10-CM

## 2018-05-29 NOTE — Telephone Encounter (Signed)
Prescription filled by Dr. Asencion Noble, MD on 05/16/18. Will not refill at this time.

## 2018-06-04 ENCOUNTER — Ambulatory Visit: Payer: Medicaid Other | Admitting: Critical Care Medicine

## 2018-06-04 NOTE — Progress Notes (Signed)
Spoke with patient concerning her elevated blood sugar at her last clinic visit at [facility]. At that time, patient had been out of her medications for 10 days. Per patient, Dr. Joya Gaskins refilled your prescriptions of Metformin and Lantus at that time.  Her most recent morning BS with 27U Lantus qHS = 179. She notes that she still has pretty elevated blood sugars even on her mediations. I recommended she come see me in 1-2 weeks before her prescriptions run out so we can do updated blood work and discuss adjustments to her diabetes treatment regimen. Patient agreed.

## 2018-06-17 MED FILL — LANTUS SOLOSTAR 100 UNITS/M: 100 | 22 days supply | Qty: 6 | Fill #1

## 2018-07-14 ENCOUNTER — Other Ambulatory Visit (HOSPITAL_COMMUNITY): Payer: Self-pay | Admitting: *Deleted

## 2018-07-14 DIAGNOSIS — N644 Mastodynia: Secondary | ICD-10-CM

## 2018-07-16 ENCOUNTER — Ambulatory Visit: Payer: Self-pay | Admitting: Critical Care Medicine

## 2018-08-13 ENCOUNTER — Emergency Department (HOSPITAL_COMMUNITY): Payer: Medicaid Other

## 2018-08-13 ENCOUNTER — Inpatient Hospital Stay (HOSPITAL_COMMUNITY)
Admission: EM | Admit: 2018-08-13 | Discharge: 2018-12-06 | DRG: 003 | Disposition: E | Payer: Medicaid Other | Attending: Neurology | Admitting: Neurology

## 2018-08-13 ENCOUNTER — Other Ambulatory Visit: Payer: Self-pay

## 2018-08-13 ENCOUNTER — Encounter (HOSPITAL_COMMUNITY): Payer: Self-pay | Admitting: Emergency Medicine

## 2018-08-13 DIAGNOSIS — J9621 Acute and chronic respiratory failure with hypoxia: Secondary | ICD-10-CM | POA: Diagnosis not present

## 2018-08-13 DIAGNOSIS — J9690 Respiratory failure, unspecified, unspecified whether with hypoxia or hypercapnia: Secondary | ICD-10-CM

## 2018-08-13 DIAGNOSIS — I82612 Acute embolism and thrombosis of superficial veins of left upper extremity: Secondary | ICD-10-CM | POA: Diagnosis not present

## 2018-08-13 DIAGNOSIS — F419 Anxiety disorder, unspecified: Secondary | ICD-10-CM | POA: Diagnosis present

## 2018-08-13 DIAGNOSIS — Z930 Tracheostomy status: Secondary | ICD-10-CM

## 2018-08-13 DIAGNOSIS — N179 Acute kidney failure, unspecified: Secondary | ICD-10-CM | POA: Diagnosis not present

## 2018-08-13 DIAGNOSIS — Z01818 Encounter for other preprocedural examination: Secondary | ICD-10-CM

## 2018-08-13 DIAGNOSIS — D259 Leiomyoma of uterus, unspecified: Secondary | ICD-10-CM | POA: Diagnosis present

## 2018-08-13 DIAGNOSIS — K9422 Gastrostomy infection: Secondary | ICD-10-CM | POA: Diagnosis not present

## 2018-08-13 DIAGNOSIS — F5102 Adjustment insomnia: Secondary | ICD-10-CM

## 2018-08-13 DIAGNOSIS — Z794 Long term (current) use of insulin: Secondary | ICD-10-CM

## 2018-08-13 DIAGNOSIS — K626 Ulcer of anus and rectum: Secondary | ICD-10-CM | POA: Diagnosis not present

## 2018-08-13 DIAGNOSIS — R0902 Hypoxemia: Secondary | ICD-10-CM

## 2018-08-13 DIAGNOSIS — K9423 Gastrostomy malfunction: Secondary | ICD-10-CM | POA: Diagnosis not present

## 2018-08-13 DIAGNOSIS — R042 Hemoptysis: Secondary | ICD-10-CM | POA: Diagnosis not present

## 2018-08-13 DIAGNOSIS — G8191 Hemiplegia, unspecified affecting right dominant side: Secondary | ICD-10-CM | POA: Diagnosis present

## 2018-08-13 DIAGNOSIS — G4700 Insomnia, unspecified: Secondary | ICD-10-CM | POA: Diagnosis not present

## 2018-08-13 DIAGNOSIS — I6322 Cerebral infarction due to unspecified occlusion or stenosis of basilar arteries: Principal | ICD-10-CM

## 2018-08-13 DIAGNOSIS — L02211 Cutaneous abscess of abdominal wall: Secondary | ICD-10-CM | POA: Diagnosis not present

## 2018-08-13 DIAGNOSIS — G588 Other specified mononeuropathies: Secondary | ICD-10-CM | POA: Diagnosis not present

## 2018-08-13 DIAGNOSIS — E1122 Type 2 diabetes mellitus with diabetic chronic kidney disease: Secondary | ICD-10-CM | POA: Diagnosis present

## 2018-08-13 DIAGNOSIS — E210 Primary hyperparathyroidism: Secondary | ICD-10-CM | POA: Diagnosis present

## 2018-08-13 DIAGNOSIS — N183 Chronic kidney disease, stage 3 unspecified: Secondary | ICD-10-CM | POA: Diagnosis present

## 2018-08-13 DIAGNOSIS — Z79899 Other long term (current) drug therapy: Secondary | ICD-10-CM

## 2018-08-13 DIAGNOSIS — R059 Cough, unspecified: Secondary | ICD-10-CM

## 2018-08-13 DIAGNOSIS — I5032 Chronic diastolic (congestive) heart failure: Secondary | ICD-10-CM | POA: Diagnosis present

## 2018-08-13 DIAGNOSIS — Z91138 Patient's unintentional underdosing of medication regimen for other reason: Secondary | ICD-10-CM

## 2018-08-13 DIAGNOSIS — Z8249 Family history of ischemic heart disease and other diseases of the circulatory system: Secondary | ICD-10-CM

## 2018-08-13 DIAGNOSIS — M25511 Pain in right shoulder: Secondary | ICD-10-CM | POA: Diagnosis not present

## 2018-08-13 DIAGNOSIS — R0789 Other chest pain: Secondary | ICD-10-CM | POA: Diagnosis not present

## 2018-08-13 DIAGNOSIS — L89892 Pressure ulcer of other site, stage 2: Secondary | ICD-10-CM | POA: Diagnosis not present

## 2018-08-13 DIAGNOSIS — D473 Essential (hemorrhagic) thrombocythemia: Secondary | ICD-10-CM | POA: Diagnosis present

## 2018-08-13 DIAGNOSIS — R509 Fever, unspecified: Secondary | ICD-10-CM

## 2018-08-13 DIAGNOSIS — Z6841 Body Mass Index (BMI) 40.0 and over, adult: Secondary | ICD-10-CM

## 2018-08-13 DIAGNOSIS — Z833 Family history of diabetes mellitus: Secondary | ICD-10-CM

## 2018-08-13 DIAGNOSIS — Z7189 Other specified counseling: Secondary | ICD-10-CM

## 2018-08-13 DIAGNOSIS — J9600 Acute respiratory failure, unspecified whether with hypoxia or hypercapnia: Secondary | ICD-10-CM

## 2018-08-13 DIAGNOSIS — Z91048 Other nonmedicinal substance allergy status: Secondary | ICD-10-CM

## 2018-08-13 DIAGNOSIS — G8250 Quadriplegia, unspecified: Secondary | ICD-10-CM | POA: Diagnosis present

## 2018-08-13 DIAGNOSIS — R110 Nausea: Secondary | ICD-10-CM

## 2018-08-13 DIAGNOSIS — K591 Functional diarrhea: Secondary | ICD-10-CM | POA: Diagnosis not present

## 2018-08-13 DIAGNOSIS — E1165 Type 2 diabetes mellitus with hyperglycemia: Secondary | ICD-10-CM | POA: Diagnosis present

## 2018-08-13 DIAGNOSIS — Z7982 Long term (current) use of aspirin: Secondary | ICD-10-CM

## 2018-08-13 DIAGNOSIS — Z4659 Encounter for fitting and adjustment of other gastrointestinal appliance and device: Secondary | ICD-10-CM

## 2018-08-13 DIAGNOSIS — Z515 Encounter for palliative care: Secondary | ICD-10-CM

## 2018-08-13 DIAGNOSIS — E870 Hyperosmolality and hypernatremia: Secondary | ICD-10-CM | POA: Diagnosis not present

## 2018-08-13 DIAGNOSIS — R471 Dysarthria and anarthria: Secondary | ICD-10-CM | POA: Diagnosis present

## 2018-08-13 DIAGNOSIS — D219 Benign neoplasm of connective and other soft tissue, unspecified: Secondary | ICD-10-CM

## 2018-08-13 DIAGNOSIS — F329 Major depressive disorder, single episode, unspecified: Secondary | ICD-10-CM | POA: Diagnosis present

## 2018-08-13 DIAGNOSIS — Z8349 Family history of other endocrine, nutritional and metabolic diseases: Secondary | ICD-10-CM

## 2018-08-13 DIAGNOSIS — R29704 NIHSS score 4: Secondary | ICD-10-CM | POA: Diagnosis not present

## 2018-08-13 DIAGNOSIS — R4702 Dysphasia: Secondary | ICD-10-CM | POA: Diagnosis present

## 2018-08-13 DIAGNOSIS — E876 Hypokalemia: Secondary | ICD-10-CM

## 2018-08-13 DIAGNOSIS — R29810 Facial weakness: Secondary | ICD-10-CM | POA: Diagnosis present

## 2018-08-13 DIAGNOSIS — I69398 Other sequelae of cerebral infarction: Secondary | ICD-10-CM

## 2018-08-13 DIAGNOSIS — K219 Gastro-esophageal reflux disease without esophagitis: Secondary | ICD-10-CM | POA: Diagnosis present

## 2018-08-13 DIAGNOSIS — M25512 Pain in left shoulder: Secondary | ICD-10-CM | POA: Diagnosis not present

## 2018-08-13 DIAGNOSIS — R29706 NIHSS score 6: Secondary | ICD-10-CM | POA: Diagnosis not present

## 2018-08-13 DIAGNOSIS — Z808 Family history of malignant neoplasm of other organs or systems: Secondary | ICD-10-CM

## 2018-08-13 DIAGNOSIS — R6520 Severe sepsis without septic shock: Secondary | ICD-10-CM

## 2018-08-13 DIAGNOSIS — B370 Candidal stomatitis: Secondary | ICD-10-CM | POA: Diagnosis not present

## 2018-08-13 DIAGNOSIS — R4781 Slurred speech: Secondary | ICD-10-CM | POA: Diagnosis present

## 2018-08-13 DIAGNOSIS — E7800 Pure hypercholesterolemia, unspecified: Secondary | ICD-10-CM | POA: Diagnosis present

## 2018-08-13 DIAGNOSIS — E890 Postprocedural hypothyroidism: Secondary | ICD-10-CM | POA: Diagnosis present

## 2018-08-13 DIAGNOSIS — I639 Cerebral infarction, unspecified: Secondary | ICD-10-CM

## 2018-08-13 DIAGNOSIS — I160 Hypertensive urgency: Secondary | ICD-10-CM | POA: Diagnosis present

## 2018-08-13 DIAGNOSIS — Z885 Allergy status to narcotic agent status: Secondary | ICD-10-CM

## 2018-08-13 DIAGNOSIS — A419 Sepsis, unspecified organism: Secondary | ICD-10-CM

## 2018-08-13 DIAGNOSIS — R29705 NIHSS score 5: Secondary | ICD-10-CM | POA: Diagnosis present

## 2018-08-13 DIAGNOSIS — D62 Acute posthemorrhagic anemia: Secondary | ICD-10-CM | POA: Diagnosis not present

## 2018-08-13 DIAGNOSIS — I10 Essential (primary) hypertension: Secondary | ICD-10-CM

## 2018-08-13 DIAGNOSIS — N939 Abnormal uterine and vaginal bleeding, unspecified: Secondary | ICD-10-CM

## 2018-08-13 DIAGNOSIS — L8990 Pressure ulcer of unspecified site, unspecified stage: Secondary | ICD-10-CM

## 2018-08-13 DIAGNOSIS — R079 Chest pain, unspecified: Secondary | ICD-10-CM | POA: Diagnosis present

## 2018-08-13 DIAGNOSIS — R05 Cough: Secondary | ICD-10-CM

## 2018-08-13 DIAGNOSIS — R252 Cramp and spasm: Secondary | ICD-10-CM

## 2018-08-13 DIAGNOSIS — D509 Iron deficiency anemia, unspecified: Secondary | ICD-10-CM | POA: Diagnosis present

## 2018-08-13 DIAGNOSIS — E119 Type 2 diabetes mellitus without complications: Secondary | ICD-10-CM

## 2018-08-13 DIAGNOSIS — R001 Bradycardia, unspecified: Secondary | ICD-10-CM | POA: Diagnosis not present

## 2018-08-13 DIAGNOSIS — K8020 Calculus of gallbladder without cholecystitis without obstruction: Secondary | ICD-10-CM | POA: Diagnosis present

## 2018-08-13 DIAGNOSIS — E785 Hyperlipidemia, unspecified: Secondary | ICD-10-CM

## 2018-08-13 DIAGNOSIS — I130 Hypertensive heart and chronic kidney disease with heart failure and stage 1 through stage 4 chronic kidney disease, or unspecified chronic kidney disease: Secondary | ICD-10-CM | POA: Diagnosis present

## 2018-08-13 DIAGNOSIS — K5909 Other constipation: Secondary | ICD-10-CM | POA: Diagnosis not present

## 2018-08-13 DIAGNOSIS — Z791 Long term (current) use of non-steroidal anti-inflammatories (NSAID): Secondary | ICD-10-CM

## 2018-08-13 DIAGNOSIS — R498 Other voice and resonance disorders: Secondary | ICD-10-CM | POA: Diagnosis not present

## 2018-08-13 DIAGNOSIS — I469 Cardiac arrest, cause unspecified: Secondary | ICD-10-CM | POA: Diagnosis not present

## 2018-08-13 DIAGNOSIS — H5509 Other forms of nystagmus: Secondary | ICD-10-CM | POA: Diagnosis not present

## 2018-08-13 DIAGNOSIS — M25522 Pain in left elbow: Secondary | ICD-10-CM

## 2018-08-13 DIAGNOSIS — A4159 Other Gram-negative sepsis: Secondary | ICD-10-CM | POA: Diagnosis not present

## 2018-08-13 DIAGNOSIS — R1312 Dysphagia, oropharyngeal phase: Secondary | ICD-10-CM | POA: Diagnosis present

## 2018-08-13 DIAGNOSIS — N938 Other specified abnormal uterine and vaginal bleeding: Secondary | ICD-10-CM | POA: Diagnosis present

## 2018-08-13 DIAGNOSIS — Z800 Family history of malignant neoplasm of digestive organs: Secondary | ICD-10-CM

## 2018-08-13 DIAGNOSIS — E871 Hypo-osmolality and hyponatremia: Secondary | ICD-10-CM | POA: Diagnosis not present

## 2018-08-13 DIAGNOSIS — R0689 Other abnormalities of breathing: Secondary | ICD-10-CM

## 2018-08-13 DIAGNOSIS — J9509 Other tracheostomy complication: Secondary | ICD-10-CM | POA: Diagnosis not present

## 2018-08-13 DIAGNOSIS — Z825 Family history of asthma and other chronic lower respiratory diseases: Secondary | ICD-10-CM

## 2018-08-13 DIAGNOSIS — Z1612 Extended spectrum beta lactamase (ESBL) resistance: Secondary | ICD-10-CM | POA: Diagnosis not present

## 2018-08-13 LAB — CBC
HEMATOCRIT: 35.7 % — AB (ref 36.0–46.0)
Hemoglobin: 10.8 g/dL — ABNORMAL LOW (ref 12.0–15.0)
MCH: 23.7 pg — ABNORMAL LOW (ref 26.0–34.0)
MCHC: 30.3 g/dL (ref 30.0–36.0)
MCV: 78.5 fL — AB (ref 80.0–100.0)
NRBC: 0 % (ref 0.0–0.2)
Platelets: 409 10*3/uL — ABNORMAL HIGH (ref 150–400)
RBC: 4.55 MIL/uL (ref 3.87–5.11)
RDW: 15.3 % (ref 11.5–15.5)
WBC: 8.3 10*3/uL (ref 4.0–10.5)

## 2018-08-13 LAB — BASIC METABOLIC PANEL
ANION GAP: 10 (ref 5–15)
BUN: 17 mg/dL (ref 6–20)
CHLORIDE: 107 mmol/L (ref 98–111)
CO2: 24 mmol/L (ref 22–32)
Calcium: 10.8 mg/dL — ABNORMAL HIGH (ref 8.9–10.3)
Creatinine, Ser: 1.28 mg/dL — ABNORMAL HIGH (ref 0.44–1.00)
GFR calc non Af Amer: 45 mL/min — ABNORMAL LOW (ref >60)
GFR, EST AFRICAN AMERICAN: 53 mL/min — AB (ref >60)
Glucose, Bld: 251 mg/dL — ABNORMAL HIGH (ref 70–99)
POTASSIUM: 3.7 mmol/L (ref 3.5–5.1)
SODIUM: 141 mmol/L (ref 135–145)

## 2018-08-13 LAB — I-STAT BETA HCG BLOOD, ED (NOT ORDERABLE): I-stat hCG, quantitative: <5 m[IU]/mL (ref <5)

## 2018-08-13 LAB — POCT I-STAT TROPONIN I: TROPONIN I, POC: 0.02 ng/mL (ref 0.00–0.08)

## 2018-08-13 MED ORDER — MORPHINE SULFATE (PF) 4 MG/ML IV SOLN
4.0000 mg | Freq: Once | INTRAVENOUS | Status: AC
  Administered 2018-08-14: 4 mg via INTRAVENOUS
  Filled 2018-08-13: qty 1

## 2018-08-13 MED ORDER — CLONIDINE HCL 0.1 MG PO TABS
0.2000 mg | ORAL_TABLET | Freq: Once | ORAL | Status: AC
  Administered 2018-08-14: 0.2 mg via ORAL
  Filled 2018-08-13: qty 2

## 2018-08-13 MED ORDER — AMLODIPINE BESYLATE 5 MG PO TABS
10.0000 mg | ORAL_TABLET | Freq: Once | ORAL | Status: AC
  Administered 2018-08-14: 10 mg via ORAL
  Filled 2018-08-13: qty 2

## 2018-08-13 MED ORDER — ONDANSETRON HCL 4 MG/2ML IJ SOLN
4.0000 mg | Freq: Once | INTRAMUSCULAR | Status: AC
  Administered 2018-08-14: 4 mg via INTRAVENOUS
  Filled 2018-08-13: qty 2

## 2018-08-13 MED ORDER — LOSARTAN POTASSIUM 25 MG PO TABS
25.0000 mg | ORAL_TABLET | Freq: Once | ORAL | Status: AC
  Administered 2018-08-14: 25 mg via ORAL
  Filled 2018-08-13: qty 1

## 2018-08-13 MED ORDER — HYDROCHLOROTHIAZIDE 12.5 MG PO CAPS
25.0000 mg | ORAL_CAPSULE | Freq: Once | ORAL | Status: AC
  Administered 2018-08-14: 25 mg via ORAL
  Filled 2018-08-13: qty 2

## 2018-08-13 NOTE — ED Provider Notes (Addendum)
[facility] DEPT Provider Note   CSN: [id number] Arrival date & time: 08/06/2018  2142     History   Chief Complaint Chief Complaint  Patient presents with  . Chest Pain    HPI Andrea [name] is a 57 y.o. female.  HPI   Andrea [name] is a 57yo female with a history of IDDM, HTN, HLD, obesity, hypertension (s/p radioactive iodine ablation), abnormal uterine bleeding, anxiety, depression, GERD who presents to the Emergency Department for evaluation of central chest pressure and shortness of breath. She states that she lost all of her medications three days ago and has not taken any of her antihypertensives (she's on five of them.) Reports that yesterday she developed gradually worsening non-radiating central chest pressure and heaviness. Pain comes and goes without trigger. She tried taking ibuprofen yesterday which helped somewhat. She also reports her right arm feels "funny" and somewhat painful when she moves it. No recent injury. She has associated shortness of breath. Denies lightheadedness, diaphoresis, nausea, vomiting, cough, fever, abdominal pain, headache, numbness, weakness, visual disturbance or changes in urination.  She denies tobacco use, alcohol or recreational drug use.  No immediate family members with MI that she is aware of.  She denies history of DVT/PE, unilateral leg swelling or calf tenderness, recent surgery or immobilization, hemoptysis, active cancer, exogenous estrogen.  She reports some bilateral leg swelling, but attributes that to being out of her diuretic.  Per chart review she had a nuclear stress test 12/2012 which was low risk and showed no significant perfusion defect or evidence of ischemia or infarction.  EF 46%.  Past Medical History:  Diagnosis Date  . Diabetes mellitus   . Environmental allergies   . Fibroid   . High cholesterol   . Hx of cardiovascular stress test    a. ETT-MV 2/14:  EF 46% (normal by echo), low risk,  no ischemia or scar  . Hx of echocardiogram    Echo 2/14: mild LVH, EF 55-60%, Gr 1 diast dysfn, mild LAE  . Hypertension   . Hyperthyroidism    Radioactive iodine ablation    Patient Active Problem List   Diagnosis Date Noted  . Low back pain 01/29/2017  . Cholelithiasis without cholecystitis 01/18/2017  . Anemia, iron deficiency 01/17/2017  . Abdominal pain   . Hyperglycemia   . Increased anion gap metabolic acidosis   . Weight loss 04/09/2016  . Headache 04/09/2016  . Fibroids 04/14/2014  . Abnormal uterine bleeding (AUB) 04/14/2014  . Unequal leg length 04/16/2013  . Lightheadedness 12/17/2012  . Chest pain 12/14/2012  . Morbid obesity ([place]) 12/29/2011  . High cholesterol   . 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 ANXIETY 02/20/2010  . GERD 01/30/2010  . THYROID NODULE 01/17/2010  . THYROMEGALY 01/16/2010  . DEPRESSION 01/16/2010  . Type II diabetes mellitus ([place]) 09/18/2007  . Essential hypertension, benign 09/18/2007    Past Surgical History:  Procedure Laterality Date  . [place]   x 2     OB History    Gravida  4   Para  3   Term  3   Preterm      AB  1   Living  3     SAB  1   TAB      Ectopic      Multiple      Live Births  3            Home Medications  Prior to Admission medications   Medication Sig Start Date End Date Taking? Authorizing Provider  amLODipine (NORVASC) 10 MG tablet Take 1 tablet (10 mg total) by mouth daily. 05/16/18   Elsie Stain, MD  aspirin EC 81 MG tablet Take 81 mg by mouth daily.    [provider]  atorvastatin (LIPITOR) 20 MG tablet Take 1 tablet (20 mg total) by mouth daily. 01/29/18   Verner Mould, MD  benzonatate (TESSALON) 100 MG capsule Take 1 capsule (100 mg total) by mouth every 8 (eight) hours. 12/16/17   Maczis, Barth Kirks, PA-C  cloNIDine (CATAPRES) 0.2 MG tablet Take 1.5 tablets (0.3 mg total) by mouth 2 (two) times daily. 05/16/18   Elsie Stain, MD  ferrous sulfate 325 (65 FE) MG EC tablet Take 1 tablet (325 mg total) by mouth 3 (three) times daily with meals. 01/17/17   Zenia Resides, MD  gabapentin (NEURONTIN) 300 MG capsule Take 1 capsule (300 mg total) by mouth 3 (three) times daily. Patient taking differently: Take 300 mg by mouth 3 (three) times daily as needed (nerve pain).  12/26/16   Lysbeth Penner, FNP  Guaifenesin (MUCINEX MAXIMUM STRENGTH) 1200 MG TB12 Take 1 tablet (1,200 mg total) by mouth every 12 (twelve) hours as needed. 12/16/17   Maczis, Barth Kirks, PA-C  hydrochlorothiazide (HYDRODIURIL) 25 MG tablet TAKE 1 TABLET (25 MG TOTAL) BY MOUTH DAILY. 05/16/18   Elsie Stain, MD  ibuprofen (ADVIL,MOTRIN) 800 MG tablet Take 1 tablet (800 mg total) by mouth every 8 (eight) hours as needed. Patient taking differently: Take 800 mg by mouth every 8 (eight) hours as needed for headache or mild pain.  01/19/17   Lawyer, Harrell Gave, PA-C  Insulin Glargine (LANTUS SOLOSTAR) 100 UNIT/ML Solostar Pen INJECT 27 UNITS INTO THE SKIN AT BEDTIME. 05/16/18   Elsie Stain, MD  Insulin Pen Needle (B-D ULTRAFINE III SHORT PEN) 31G X 8 MM MISC USE AS DIRECTED TO INJECT INSULIN AT BEDTIME 01/15/18   Verner Mould, MD  Insulin Syringe-Needle U-100 (INSULIN SYRINGE .5CC/30GX1/2") 30G X 1/2" 0.5 ML MISC Use to inject insulin daily as prescribed 01/23/17   Virginia Crews, MD  losartan (COZAAR) 25 MG tablet Take 1 tablet (25 mg total) by mouth daily. 05/16/18   Elsie Stain, MD  megestrol (MEGACE) 40 MG tablet TAKE 2 TABLETS BY MOUTH TWICE A DAY , MAY INCREASE TO 3 TABLETS TWICE DAILY FOR HEAVY BLEEDING 11/26/17   Anyanwu, Sallyanne Havers, MD  metFORMIN (GLUCOPHAGE) 1000 MG tablet Take 1 tablet (1,000 mg total) by mouth 2 (two) times daily with a meal. 05/16/18   Elsie Stain, MD  metoprolol succinate (TOPROL XL) 50 MG 24 hr tablet Take 1 tablet (50 mg total) by mouth daily. Take with or immediately following a meal.  05/16/18   Elsie Stain, MD  VOLTAREN 1 % GEL APPLY 2 G TOPICALLY 4 (FOUR) TIMES DAILY. Patient taking differently: Apply 2 g topically 4 (four) times daily as needed (pain).  01/16/17   Virginia Crews, MD  sertraline (ZOLOFT) 50 MG tablet Take 50 mg by mouth daily.  01/19/12  [provider]    Family History Family History  Problem Relation Age of Onset  . Pneumonia Mother   . Diabetes Mother   . Hypertension Father   . Hypertension Sister   . Hyperlipidemia Sister   . Hypertension Brother   . Hypertension Maternal Aunt   . Hypertension Paternal Aunt   .  Cancer Paternal Aunt        throat cancer  . Diabetes Maternal Grandmother   . Diabetes Paternal Grandmother   . Cancer Paternal Grandmother        pancreatic cancer    Social History Social History   Tobacco Use  . Smoking status: Never Smoker  . Smokeless tobacco: Never Used  Substance Use Topics  . Alcohol use: No    Comment: occasional for special occasions  . Drug use: No     Allergies   Tramadol   Review of Systems Review of Systems  Constitutional: Negative for chills, diaphoresis and fever.  Respiratory: Positive for shortness of breath. Negative for cough.   Cardiovascular: Positive for chest pain and leg swelling.  Gastrointestinal: Negative for abdominal pain, nausea and vomiting.  Genitourinary: Negative for difficulty urinating.  Musculoskeletal: Negative for back pain and gait problem.  Skin: Negative for color change.  Neurological: Negative for syncope, weakness, light-headedness and numbness.  Psychiatric/Behavioral: Negative for agitation.  All other systems reviewed and are negative.    Physical Exam Updated Vital Signs BP (!) 145/91 (BP Location: Left Arm)   Pulse 77   Temp 98.7 F (37.1 C) (Oral)   Resp 18   Ht 5' 6.5" (1.689 m)   Wt 122.5 kg   LMP 07/26/2018   SpO2 100%   BMI 42.93 kg/m   Physical Exam  Constitutional: She is oriented to person, place,  and time. She appears well-developed and well-nourished. No distress.  No acute distress, nontoxic-appearing.  HENT:  Head: Normocephalic and atraumatic.  Mouth/Throat: Oropharynx is clear and moist.  Eyes: Pupils are equal, round, and reactive to light. Conjunctivae are normal. Right eye exhibits no discharge. Left eye exhibits no discharge.  Neck: Normal range of motion. Neck supple. No JVD present. No tracheal deviation present.  Cardiovascular: Normal rate, regular rhythm and intact distal pulses.  No murmur heard. Pulmonary/Chest: Effort normal and breath sounds normal. No stridor. No respiratory distress. She has no wheezes. She has no rales.  Anterior chest wall nontender to palpation.  Abdominal: Soft. Bowel sounds are normal. There is no tenderness.  Musculoskeletal:  1+ pitting edema in bilateral legs to the midshin.  No erythema or calf tenderness.  DP pulses 2+ and symmetric bilaterally.  Neurological: She is alert and oriented to person, place, and time. Coordination normal.  Skin: Skin is warm and dry. She is not diaphoretic.  Psychiatric: She has a normal mood and affect. Her behavior is normal.  Nursing note and vitals reviewed.   ED Treatments / Results  Labs (all labs ordered are listed, but only abnormal results are displayed) Labs Reviewed  BASIC METABOLIC PANEL - Abnormal; Notable for the following components:      Result Value   Glucose, Bld 251 (*)    Creatinine, Ser 1.28 (*)    Calcium 10.8 (*)    GFR calc non Af Amer 45 (*)    GFR calc Af Amer 53 (*)    All other components within normal limits  CBC - Abnormal; Notable for the following components:   Hemoglobin 10.8 (*)    HCT 35.7 (*)    MCV 78.5 (*)    MCH 23.7 (*)    Platelets 409 (*)    All other components within normal limits  BRAIN NATRIURETIC PEPTIDE  TSH  I-STAT TROPONIN, ED  I-STAT BETA HCG BLOOD, ED ([facility], [facility], [facility] ONLY)  I-STAT BETA HCG BLOOD, ED (NOT ORDERABLE)  POCT I-STAT TROPONIN  I     EKG EKG Interpretation  Date/Time:  Wednesday August 13 2018 22:35:25 EDT Ventricular Rate:  69 PR Interval:    QRS Duration: 110 QT Interval:  459 QTC Calculation: 492 R Axis:   16 Text Interpretation:  Sinus rhythm Probable left atrial enlargement Abnormal R-wave progression, early transition Left ventricular hypertrophy Borderline prolonged QT interval repolarization changes on prior ECG resolved Reconfirmed by Addison Lank [phone]) on 08/17/2018 12:36:55 AM   Radiology Dg Chest 2 View  Result Date: 08/18/2018 CLINICAL DATA:  Chest pain starting yesterday. EXAM: CHEST - 2 VIEW COMPARISON:  12/16/2017 FINDINGS: Stable cardiomegaly with atherosclerotic, slightly ectatic thoracic aorta. Central pulmonary vascular congestion is noted without pulmonary consolidation. No effusion or pneumothorax. Thoracic spondylosis is noted. No acute osseous abnormality is seen. IMPRESSION: Cardiomegaly with mild central vascular congestion. Electronically Signed   By: Ashley Royalty M.D.   On: 08/13/2018 22:49    Procedures Procedures (including critical care time)  CRITICAL CARE Performed by: Glyn Ade   Total critical care time: 35 minutes  Critical care time was exclusive of separately billable procedures and treating other patients.  Critical care was necessary to treat or prevent imminent or life-threatening deterioration.  Critical care was time spent personally by me on the following activities: development of treatment plan with patient and/or surrogate as well as nursing, discussions with consultants, evaluation of patient's response to treatment, examination of patient, obtaining history from patient or surrogate, ordering and performing treatments and interventions, ordering and review of laboratory studies, ordering and review of radiographic studies, pulse oximetry and re-evaluation of patient's condition.   Medications Ordered in ED Medications  nitroGLYCERIN  (NITROSTAT) SL tablet 0.4 mg (0.4 mg Sublingual Given 08/21/2018 0109)  amLODipine (NORVASC) tablet 10 mg (10 mg Oral Given 08/31/2018 0011)  cloNIDine (CATAPRES) tablet 0.2 mg (0.2 mg Oral Given 08/10/2018 0011)  hydrochlorothiazide (MICROZIDE) capsule 25 mg (25 mg Oral Given 08/12/2018 0012)  losartan (COZAAR) tablet 25 mg (25 mg Oral Given 08/30/2018 0015)  morphine 4 MG/ML injection 4 mg (4 mg Intravenous Given 08/21/2018 0014)  ondansetron (ZOFRAN) injection 4 mg (4 mg Intravenous Given 08/22/2018 0014)  aspirin chewable tablet 324 mg (324 mg Oral Given 08/11/2018 0100)     Initial Impression / Assessment and Plan / ED Course  I have reviewed the triage vital signs and the nursing notes.  Pertinent labs & imaging results that were available during my care of the patient were reviewed by me and considered in my medical decision making (see chart for details).    57yo patient with a hx of hypertension, hyperlipidemia, IDDM, obesity presents with waxing and waning central chest pressure and associated shortness of breath. She is hypertensive 220/117 on arrival, of note has not taken any of her five anti-hypertensives in the past three days due to losing her medication.   Initial EKG nonischemic and troponin negative. She has a HEART score of 4. Pain treated with SL Nitro, 325 chewable aspirin and morphine. She reports pain is improved, but not resolved on recheck. Home anti-hypertensive medication ordered and her blood pressure improved to 145/91.   Plan to admit to the hospitalist service for further evaluation of patient's chest pain and ACS r/o. I do not suspect PE given no tachycardia, tachypnea, recent surgery or immobilization, unilateral leg swelling or calf tenderness, exogenous estrogen. CXR without acute abnormality, no pneumonia, pneumothorax. No widening of the mediastinum on CXR, pulse deficits, new murmur, neurological symptoms to suggest acute aortic dissection.  She is afebrile and non-toxic  without leukocytosis and I doubt esophageal perforation. BNP 116 and only faint signs of fluid overload on exam, doubt CHF exacerbation. Discussed this patient with Dr. Myna Hidalgo who will admit to medicine. This was a shared visit with Dr. Leonette Monarch who also saw the patient and agrees with plan to admit.   Final Clinical Impressions(s) / ED Diagnoses   Final diagnoses:  Chest pain, unspecified type    ED Discharge Orders    None       Glyn Ade, PA-C 08/13/2018 [id number]    Glyn Ade, PA-C 08/22/2018 [id number]    Fatima Blank, MD 08/05/2018 [phone]

## 2018-08-13 NOTE — ED Triage Notes (Signed)
Patient is complaining of mid chest pain that started yesterday. Patient states left arm is feeling funny. Patient states she has had slurred speech. Patient is talking complete sentences while getting info from patient. Patient states she is not breathing normal.

## 2018-08-13 NOTE — ED Notes (Signed)
Tried to get EKG multiple times and monitor in triage 1 is not working. Reported to Network engineer.

## 2018-08-14 ENCOUNTER — Other Ambulatory Visit: Payer: Self-pay

## 2018-08-14 ENCOUNTER — Observation Stay (HOSPITAL_COMMUNITY): Payer: Medicaid Other

## 2018-08-14 ENCOUNTER — Inpatient Hospital Stay (HOSPITAL_COMMUNITY)
Admit: 2018-08-14 | Discharge: 2018-08-14 | Disposition: A | Discharge status: Home / Self Care | Payer: Medicaid Other | Attending: Student in an Organized Health Care Education/Training Program | Admitting: Student in an Organized Health Care Education/Training Program

## 2018-08-14 ENCOUNTER — Inpatient Hospital Stay (HOSPITAL_COMMUNITY): Payer: Medicaid Other

## 2018-08-14 ENCOUNTER — Encounter (HOSPITAL_COMMUNITY): Admission: EM | Disposition: E | Payer: Self-pay | Source: Home / Self Care | Attending: Neurology

## 2018-08-14 ENCOUNTER — Encounter (HOSPITAL_COMMUNITY): Payer: Self-pay | Admitting: Family Medicine

## 2018-08-14 ENCOUNTER — Observation Stay (HOSPITAL_COMMUNITY): Payer: Medicaid Other | Admitting: Certified Registered"

## 2018-08-14 DIAGNOSIS — D62 Acute posthemorrhagic anemia: Secondary | ICD-10-CM | POA: Diagnosis not present

## 2018-08-14 DIAGNOSIS — I82612 Acute embolism and thrombosis of superficial veins of left upper extremity: Secondary | ICD-10-CM | POA: Diagnosis not present

## 2018-08-14 DIAGNOSIS — K9423 Gastrostomy malfunction: Secondary | ICD-10-CM | POA: Diagnosis not present

## 2018-08-14 DIAGNOSIS — J9601 Acute respiratory failure with hypoxia: Secondary | ICD-10-CM

## 2018-08-14 DIAGNOSIS — I469 Cardiac arrest, cause unspecified: Secondary | ICD-10-CM | POA: Diagnosis not present

## 2018-08-14 DIAGNOSIS — N183 Chronic kidney disease, stage 3 unspecified: Secondary | ICD-10-CM | POA: Diagnosis present

## 2018-08-14 DIAGNOSIS — R079 Chest pain, unspecified: Secondary | ICD-10-CM | POA: Diagnosis present

## 2018-08-14 DIAGNOSIS — A4159 Other Gram-negative sepsis: Secondary | ICD-10-CM | POA: Diagnosis not present

## 2018-08-14 DIAGNOSIS — G8250 Quadriplegia, unspecified: Secondary | ICD-10-CM | POA: Diagnosis present

## 2018-08-14 DIAGNOSIS — G8191 Hemiplegia, unspecified affecting right dominant side: Secondary | ICD-10-CM | POA: Diagnosis present

## 2018-08-14 DIAGNOSIS — I6322 Cerebral infarction due to unspecified occlusion or stenosis of basilar arteries: Secondary | ICD-10-CM | POA: Diagnosis present

## 2018-08-14 DIAGNOSIS — Z1612 Extended spectrum beta lactamase (ESBL) resistance: Secondary | ICD-10-CM | POA: Diagnosis not present

## 2018-08-14 DIAGNOSIS — N179 Acute kidney failure, unspecified: Secondary | ICD-10-CM | POA: Diagnosis not present

## 2018-08-14 DIAGNOSIS — I130 Hypertensive heart and chronic kidney disease with heart failure and stage 1 through stage 4 chronic kidney disease, or unspecified chronic kidney disease: Secondary | ICD-10-CM | POA: Diagnosis present

## 2018-08-14 DIAGNOSIS — L02211 Cutaneous abscess of abdominal wall: Secondary | ICD-10-CM | POA: Diagnosis not present

## 2018-08-14 DIAGNOSIS — R0689 Other abnormalities of breathing: Secondary | ICD-10-CM

## 2018-08-14 DIAGNOSIS — Z6841 Body Mass Index (BMI) 40.0 and over, adult: Secondary | ICD-10-CM | POA: Diagnosis not present

## 2018-08-14 DIAGNOSIS — I160 Hypertensive urgency: Secondary | ICD-10-CM | POA: Diagnosis present

## 2018-08-14 DIAGNOSIS — E870 Hyperosmolality and hypernatremia: Secondary | ICD-10-CM | POA: Diagnosis not present

## 2018-08-14 DIAGNOSIS — E871 Hypo-osmolality and hyponatremia: Secondary | ICD-10-CM | POA: Diagnosis not present

## 2018-08-14 DIAGNOSIS — K9422 Gastrostomy infection: Secondary | ICD-10-CM | POA: Diagnosis not present

## 2018-08-14 DIAGNOSIS — L89892 Pressure ulcer of other site, stage 2: Secondary | ICD-10-CM | POA: Diagnosis not present

## 2018-08-14 DIAGNOSIS — K626 Ulcer of anus and rectum: Secondary | ICD-10-CM | POA: Diagnosis not present

## 2018-08-14 DIAGNOSIS — I639 Cerebral infarction, unspecified: Secondary | ICD-10-CM

## 2018-08-14 DIAGNOSIS — R042 Hemoptysis: Secondary | ICD-10-CM | POA: Diagnosis not present

## 2018-08-14 DIAGNOSIS — J9509 Other tracheostomy complication: Secondary | ICD-10-CM | POA: Diagnosis not present

## 2018-08-14 DIAGNOSIS — I5032 Chronic diastolic (congestive) heart failure: Secondary | ICD-10-CM | POA: Diagnosis not present

## 2018-08-14 DIAGNOSIS — J9621 Acute and chronic respiratory failure with hypoxia: Secondary | ICD-10-CM | POA: Diagnosis not present

## 2018-08-14 DIAGNOSIS — B370 Candidal stomatitis: Secondary | ICD-10-CM | POA: Diagnosis not present

## 2018-08-14 DIAGNOSIS — R1312 Dysphagia, oropharyngeal phase: Secondary | ICD-10-CM | POA: Diagnosis not present

## 2018-08-14 HISTORY — PX: IR ANGIO INTRA EXTRACRAN SEL COM CAROTID INNOMINATE BILAT MOD SED: IMG5360

## 2018-08-14 HISTORY — PX: IR INTRA CRAN STENT: IMG2345

## 2018-08-14 HISTORY — PX: IR CT HEAD LTD: IMG2386

## 2018-08-14 HISTORY — PX: IR ANGIO VERTEBRAL SEL SUBCLAVIAN INNOMINATE UNI R MOD SED: IMG5365

## 2018-08-14 HISTORY — PX: RADIOLOGY WITH ANESTHESIA: SHX6223

## 2018-08-14 LAB — URINALYSIS, ROUTINE W REFLEX MICROSCOPIC
Bacteria, UA: NONE SEEN
Bilirubin Urine: NEGATIVE
GLUCOSE, UA: 150 mg/dL — AB
Ketones, ur: NEGATIVE mg/dL
Leukocytes, UA: NEGATIVE
NITRITE: NEGATIVE
PH: 5 (ref 5.0–8.0)
Protein, ur: 30 mg/dL — AB
SPECIFIC GRAVITY, URINE: 1.014 (ref 1.005–1.030)

## 2018-08-14 LAB — POCT ACTIVATED CLOTTING TIME
ACTIVATED CLOTTING TIME: 158 s
ACTIVATED CLOTTING TIME: 169 s

## 2018-08-14 LAB — RAPID URINE DRUG SCREEN, HOSP PERFORMED
AMPHETAMINES: NOT DETECTED
BARBITURATES: NOT DETECTED
BENZODIAZEPINES: NOT DETECTED
COCAINE: NOT DETECTED
Opiates: NOT DETECTED
Tetrahydrocannabinol: NOT DETECTED

## 2018-08-14 LAB — GLUCOSE, CAPILLARY
GLUCOSE-CAPILLARY: 228 mg/dL — AB (ref 70–99)
GLUCOSE-CAPILLARY: 262 mg/dL — AB (ref 70–99)
GLUCOSE-CAPILLARY: 249 mg/dL — AB (ref 70–99)
Glucose-Capillary: 244 mg/dL — ABNORMAL HIGH (ref 70–99)
Glucose-Capillary: 273 mg/dL — ABNORMAL HIGH (ref 70–99)

## 2018-08-14 LAB — BASIC METABOLIC PANEL
ANION GAP: 12 (ref 5–15)
ANION GAP: 10 (ref 5–15)
BUN: 17 mg/dL (ref 6–20)
BUN: 12 mg/dL (ref 6–20)
CALCIUM: 10.3 mg/dL (ref 8.9–10.3)
CHLORIDE: 106 mmol/L (ref 98–111)
CO2: 22 mmol/L (ref 22–32)
CO2: 21 mmol/L — AB (ref 22–32)
Calcium: 10.6 mg/dL — ABNORMAL HIGH (ref 8.9–10.3)
Chloride: 105 mmol/L (ref 98–111)
Creatinine, Ser: 1.15 mg/dL — ABNORMAL HIGH (ref 0.44–1.00)
Creatinine, Ser: 1.03 mg/dL — ABNORMAL HIGH (ref 0.44–1.00)
GFR calc Af Amer: >60 mL/min (ref >60)
GFR calc Af Amer: >60 mL/min (ref >60)
GFR calc non Af Amer: 52 mL/min — ABNORMAL LOW (ref >60)
GFR calc non Af Amer: 59 mL/min — ABNORMAL LOW (ref >60)
GLUCOSE: 252 mg/dL — AB (ref 70–99)
GLUCOSE: 255 mg/dL — AB (ref 70–99)
POTASSIUM: 3.4 mmol/L — AB (ref 3.5–5.1)
Potassium: 3.4 mmol/L — ABNORMAL LOW (ref 3.5–5.1)
Sodium: 140 mmol/L (ref 135–145)
Sodium: 136 mmol/L (ref 135–145)

## 2018-08-14 LAB — CBC WITH DIFFERENTIAL/PLATELET
ABS IMMATURE GRANULOCYTES: 0.03 10*3/uL (ref 0.00–0.07)
BASOS PCT: 0 %
Basophils Absolute: 0 10*3/uL (ref 0.0–0.1)
Eosinophils Absolute: 0.1 10*3/uL (ref 0.0–0.5)
Eosinophils Relative: 1 %
HEMATOCRIT: 36 % (ref 36.0–46.0)
Hemoglobin: 11.1 g/dL — ABNORMAL LOW (ref 12.0–15.0)
IMMATURE GRANULOCYTES: 0 %
LYMPHS PCT: 37 %
Lymphs Abs: 3.5 10*3/uL (ref 0.7–4.0)
MCH: 23.7 pg — AB (ref 26.0–34.0)
MCHC: 30.8 g/dL (ref 30.0–36.0)
MCV: 76.9 fL — AB (ref 80.0–100.0)
MONOS PCT: 6 %
Monocytes Absolute: 0.6 10*3/uL (ref 0.1–1.0)
NEUTROS ABS: 5.2 10*3/uL (ref 1.7–7.7)
NEUTROS PCT: 56 %
PLATELETS: 418 10*3/uL — AB (ref 150–400)
RBC: 4.68 MIL/uL (ref 3.87–5.11)
RDW: 15 % (ref 11.5–15.5)
WBC: 9.4 10*3/uL (ref 4.0–10.5)
nRBC: 0 % (ref 0.0–0.2)

## 2018-08-14 LAB — POCT I-STAT 3, ART BLOOD GAS (G3+)
ACID-BASE DEFICIT: 3 mmol/L — AB (ref 0.0–2.0)
Bicarbonate: 22.3 mmol/L (ref 20.0–28.0)
O2 Saturation: 100 %
PCO2 ART: 41.9 mmHg (ref 32.0–48.0)
PO2 ART: 275 mmHg — AB (ref 83.0–108.0)
TCO2: 24 mmol/L (ref 22–32)
pH, Arterial: 7.334 — ABNORMAL LOW (ref 7.350–7.450)

## 2018-08-14 LAB — MRSA PCR SCREENING: MRSA by PCR: NEGATIVE

## 2018-08-14 LAB — TROPONIN I
Troponin I: 0.03 ng/mL (ref <0.03)
Troponin I: <0.03 ng/mL (ref <0.03)

## 2018-08-14 LAB — TSH: TSH: 1.338 u[IU]/mL (ref 0.350–4.500)

## 2018-08-14 LAB — HIV ANTIBODY (ROUTINE TESTING W REFLEX): HIV Screen 4th Generation wRfx: NONREACTIVE

## 2018-08-14 LAB — BRAIN NATRIURETIC PEPTIDE: B Natriuretic Peptide: 116 pg/mL — ABNORMAL HIGH (ref 0.0–100.0)

## 2018-08-14 LAB — MAGNESIUM: Magnesium: 1.4 mg/dL — ABNORMAL LOW (ref 1.7–2.4)

## 2018-08-14 LAB — PROTIME-INR
INR: 1.02
Prothrombin Time: 13.3 seconds (ref 11.4–15.2)

## 2018-08-14 LAB — PHOSPHORUS: Phosphorus: 3.3 mg/dL (ref 2.5–4.6)

## 2018-08-14 SURGERY — IR WITH ANESTHESIA
Anesthesia: General

## 2018-08-14 MED ORDER — IOPAMIDOL (ISOVUE-370) INJECTION 76%
50.0000 mL | Freq: Once | INTRAVENOUS | Status: AC | PRN
  Administered 2018-08-14: 50 mL via INTRAVENOUS

## 2018-08-14 MED ORDER — ESMOLOL HCL 100 MG/10ML IV SOLN
INTRAVENOUS | Status: DC | PRN
  Administered 2018-08-14: 20 mg via INTRAVENOUS
  Administered 2018-08-14: 10 mg via INTRAVENOUS
  Administered 2018-08-14: 20 mg via INTRAVENOUS
  Administered 2018-08-14: 10 mg via INTRAVENOUS
  Administered 2018-08-14: 20 mg via INTRAVENOUS

## 2018-08-14 MED ORDER — HYDROCHLOROTHIAZIDE 25 MG PO TABS: 25.0000 mg | ORAL_TABLET | Freq: Every day | ORAL | Status: DC

## 2018-08-14 MED ORDER — FENTANYL CITRATE (PF) 100 MCG/2ML IJ SOLN
INTRAMUSCULAR | Status: AC
  Administered 2018-08-14: 50 ug via INTRAVENOUS
  Filled 2018-08-14: qty 2

## 2018-08-14 MED ORDER — MIDAZOLAM HCL 2 MG/2ML IJ SOLN
INTRAMUSCULAR | Status: AC
  Administered 2018-08-14: 4 mg via INTRAVENOUS
  Filled 2018-08-14: qty 4

## 2018-08-14 MED ORDER — IOHEXOL 300 MG/ML  SOLN
150.0000 mL | Freq: Once | INTRAMUSCULAR | Status: AC | PRN
  Administered 2018-08-14: 60 mL via INTRAVENOUS

## 2018-08-14 MED ORDER — TICAGRELOR 90 MG PO TABS
ORAL_TABLET | ORAL | Status: AC
  Filled 2018-08-14: qty 2

## 2018-08-14 MED ORDER — GABAPENTIN 300 MG PO CAPS: 300.0000 mg | ORAL_CAPSULE | Freq: Three times a day (TID) | ORAL | Status: DC | PRN

## 2018-08-14 MED ORDER — ACETAMINOPHEN 160 MG/5ML PO SOLN: 650.0000 mg | ORAL | Status: DC | PRN

## 2018-08-14 MED ORDER — STROKE: EARLY STAGES OF RECOVERY BOOK
Freq: Once | Status: AC
  Administered 2018-08-26: 04:00:00
  Filled 2018-08-14 (×2): qty 1

## 2018-08-14 MED ORDER — BENZONATATE 100 MG PO CAPS
100.0000 mg | ORAL_CAPSULE | Freq: Three times a day (TID) | ORAL | Status: DC | PRN
  Filled 2018-08-14: qty 1

## 2018-08-14 MED ORDER — ROCURONIUM BROMIDE 50 MG/5ML IV SOSY
PREFILLED_SYRINGE | INTRAVENOUS | Status: DC | PRN
  Administered 2018-08-14: 50 mg via INTRAVENOUS
  Administered 2018-08-14: 15 mg via INTRAVENOUS
  Administered 2018-08-14: 50 mg via INTRAVENOUS

## 2018-08-14 MED ORDER — LOSARTAN POTASSIUM 50 MG PO TABS: 25.0000 mg | ORAL_TABLET | Freq: Every day | ORAL | Status: DC

## 2018-08-14 MED ORDER — HYDRALAZINE HCL 20 MG/ML IJ SOLN
20.0000 mg | Freq: Once | INTRAMUSCULAR | Status: AC
  Administered 2018-08-14: 20 mg via INTRAVENOUS

## 2018-08-14 MED ORDER — LIDOCAINE HCL 1 % IJ SOLN
INTRAMUSCULAR | Status: AC
  Filled 2018-08-14: qty 20

## 2018-08-14 MED ORDER — LABETALOL HCL 5 MG/ML IV SOLN
INTRAVENOUS | Status: DC | PRN
  Administered 2018-08-14: 5 mg via INTRAVENOUS

## 2018-08-14 MED ORDER — HEPARIN SODIUM (PORCINE) 1000 UNIT/ML IJ SOLN
INTRAMUSCULAR | Status: DC | PRN
  Administered 2018-08-14: 500 [IU] via INTRAVENOUS
  Administered 2018-08-14: 1000 [IU] via INTRAVENOUS
  Administered 2018-08-14: 500 [IU] via INTRAVENOUS
  Administered 2018-08-14: 2000 [IU] via INTRAVENOUS

## 2018-08-14 MED ORDER — ETOMIDATE 2 MG/ML IV SOLN
30.0000 mg | Freq: Once | INTRAVENOUS | Status: AC
  Administered 2018-08-14: 30 mg via INTRAVENOUS

## 2018-08-14 MED ORDER — MIDAZOLAM HCL 2 MG/2ML IJ SOLN
4.0000 mg | Freq: Once | INTRAMUSCULAR | Status: AC
  Administered 2018-08-14: 4 mg via INTRAVENOUS

## 2018-08-14 MED ORDER — TICAGRELOR 90 MG PO TABS
90.0000 mg | ORAL_TABLET | Freq: Two times a day (BID) | ORAL | Status: DC
  Filled 2018-08-14: qty 1

## 2018-08-14 MED ORDER — PHENYLEPHRINE HCL 10 MG/ML IJ SOLN
INTRAMUSCULAR | Status: DC | PRN
  Administered 2018-08-14 (×2): 40 ug via INTRAVENOUS

## 2018-08-14 MED ORDER — SODIUM CHLORIDE 0.9 % IV SOLN
INTRAVENOUS | Status: DC | PRN
  Administered 2018-08-14: 10:00:00 via INTRAVENOUS

## 2018-08-14 MED ORDER — ONDANSETRON HCL 4 MG/2ML IJ SOLN
INTRAMUSCULAR | Status: DC | PRN
  Administered 2018-08-14: 4 mg via INTRAVENOUS

## 2018-08-14 MED ORDER — FENTANYL CITRATE (PF) 100 MCG/2ML IJ SOLN
50.0000 ug | Freq: Once | INTRAMUSCULAR | Status: AC
  Administered 2018-08-14: 50 ug via INTRAVENOUS

## 2018-08-14 MED ORDER — CLONIDINE HCL 0.3 MG PO TABS: 0.3000 mg | ORAL_TABLET | Freq: Two times a day (BID) | ORAL | Status: DC

## 2018-08-14 MED ORDER — HYDRALAZINE HCL 20 MG/ML IJ SOLN
10.0000 mg | INTRAMUSCULAR | Status: DC | PRN
  Administered 2018-08-15: 30 mg via INTRAVENOUS
  Administered 2018-08-16 – 2018-08-17 (×4): 20 mg via INTRAVENOUS
  Administered 2018-08-18 – 2018-08-19 (×5): 40 mg via INTRAVENOUS
  Administered 2018-08-19 (×2): 20 mg via INTRAVENOUS
  Administered 2018-08-19: 40 mg via INTRAVENOUS
  Administered 2018-08-20 (×2): 20 mg via INTRAVENOUS
  Administered 2018-08-29: 40 mg via INTRAVENOUS
  Filled 2018-08-14: qty 2
  Filled 2018-08-14: qty 1
  Filled 2018-08-14: qty 2
  Filled 2018-08-14: qty 1
  Filled 2018-08-14 (×6): qty 2
  Filled 2018-08-14 (×3): qty 1
  Filled 2018-08-14: qty 2
  Filled 2018-08-14: qty 1
  Filled 2018-08-14: qty 2
  Filled 2018-08-14: qty 1

## 2018-08-14 MED ORDER — ONDANSETRON HCL 4 MG/2ML IJ SOLN
INTRAMUSCULAR | Status: AC
  Filled 2018-08-14: qty 2

## 2018-08-14 MED ORDER — CHLORHEXIDINE GLUCONATE 0.12% ORAL RINSE (MEDLINE KIT)
15.0000 mL | Freq: Two times a day (BID) | OROMUCOSAL | Status: DC
  Administered 2018-08-14 – 2018-08-27 (×26): 15 mL via OROMUCOSAL

## 2018-08-14 MED ORDER — ACETAMINOPHEN 650 MG RE SUPP
650.0000 mg | RECTAL | Status: DC | PRN
  Administered 2018-08-16: 650 mg via RECTAL
  Filled 2018-08-14 (×3): qty 1

## 2018-08-14 MED ORDER — LIDOCAINE 2% (20 MG/ML) 5 ML SYRINGE
INTRAMUSCULAR | Status: DC | PRN
  Administered 2018-08-14: 100 mg via INTRAVENOUS

## 2018-08-14 MED ORDER — FENTANYL CITRATE (PF) 100 MCG/2ML IJ SOLN
INTRAMUSCULAR | Status: DC | PRN
  Administered 2018-08-14: 250 ug via INTRAVENOUS

## 2018-08-14 MED ORDER — INSULIN ASPART 100 UNIT/ML ~~LOC~~ SOLN
0.0000 [IU] | SUBCUTANEOUS | Status: DC
  Administered 2018-08-14: 7 [IU] via SUBCUTANEOUS
  Administered 2018-08-15: 4 [IU] via SUBCUTANEOUS
  Administered 2018-08-15: 11 [IU] via SUBCUTANEOUS
  Administered 2018-08-15: 7 [IU] via SUBCUTANEOUS
  Administered 2018-08-15 (×3): 4 [IU] via SUBCUTANEOUS
  Administered 2018-08-16 (×2): 7 [IU] via SUBCUTANEOUS
  Administered 2018-08-16 – 2018-08-17 (×6): 4 [IU] via SUBCUTANEOUS
  Administered 2018-08-17: 7 [IU] via SUBCUTANEOUS
  Administered 2018-08-17 (×2): 4 [IU] via SUBCUTANEOUS
  Administered 2018-08-18: 7 [IU] via SUBCUTANEOUS
  Administered 2018-08-18: 11 [IU] via SUBCUTANEOUS
  Administered 2018-08-18: 7 [IU] via SUBCUTANEOUS
  Administered 2018-08-18 (×2): 4 [IU] via SUBCUTANEOUS
  Administered 2018-08-18 – 2018-08-19 (×3): 11 [IU] via SUBCUTANEOUS
  Administered 2018-08-19: 7 [IU] via SUBCUTANEOUS
  Administered 2018-08-19: 11 [IU] via SUBCUTANEOUS
  Administered 2018-08-19: 7 [IU] via SUBCUTANEOUS
  Administered 2018-08-19 (×2): 11 [IU] via SUBCUTANEOUS
  Administered 2018-08-20: 4 [IU] via SUBCUTANEOUS
  Administered 2018-08-20: 7 [IU] via SUBCUTANEOUS
  Administered 2018-08-20: 4 [IU] via SUBCUTANEOUS
  Administered 2018-08-20 – 2018-08-21 (×3): 7 [IU] via SUBCUTANEOUS
  Administered 2018-08-21: 4 [IU] via SUBCUTANEOUS
  Administered 2018-08-21: 7 [IU] via SUBCUTANEOUS
  Administered 2018-08-21 – 2018-08-22 (×4): 4 [IU] via SUBCUTANEOUS
  Administered 2018-08-22: 3 [IU] via SUBCUTANEOUS
  Administered 2018-08-22: 4 [IU] via SUBCUTANEOUS
  Administered 2018-08-22: 3 [IU] via SUBCUTANEOUS
  Administered 2018-08-22: 4 [IU] via SUBCUTANEOUS
  Administered 2018-08-23: 7 [IU] via SUBCUTANEOUS
  Administered 2018-08-23 (×4): 4 [IU] via SUBCUTANEOUS
  Administered 2018-08-24 (×2): 7 [IU] via SUBCUTANEOUS
  Administered 2018-08-24: 3 [IU] via SUBCUTANEOUS
  Administered 2018-08-24: 4 [IU] via SUBCUTANEOUS
  Administered 2018-08-24: 7 [IU] via SUBCUTANEOUS
  Administered 2018-08-24 – 2018-08-25 (×4): 4 [IU] via SUBCUTANEOUS
  Administered 2018-08-25 (×2): 3 [IU] via SUBCUTANEOUS
  Administered 2018-08-25 – 2018-08-26 (×2): 4 [IU] via SUBCUTANEOUS
  Administered 2018-08-26 (×4): 7 [IU] via SUBCUTANEOUS
  Administered 2018-08-26: 11 [IU] via SUBCUTANEOUS
  Administered 2018-08-26: 4 [IU] via SUBCUTANEOUS
  Administered 2018-08-27: 11 [IU] via SUBCUTANEOUS
  Administered 2018-08-27: 7 [IU] via SUBCUTANEOUS
  Administered 2018-08-27: 4 [IU] via SUBCUTANEOUS
  Administered 2018-08-27 – 2018-08-28 (×4): 7 [IU] via SUBCUTANEOUS
  Administered 2018-08-28: 4 [IU] via SUBCUTANEOUS
  Administered 2018-08-28 – 2018-08-29 (×4): 7 [IU] via SUBCUTANEOUS
  Administered 2018-08-29 (×2): 4 [IU] via SUBCUTANEOUS
  Administered 2018-08-29 (×3): 7 [IU] via SUBCUTANEOUS
  Administered 2018-08-30: 11 [IU] via SUBCUTANEOUS
  Administered 2018-08-30: 15 [IU] via SUBCUTANEOUS
  Administered 2018-08-30: 7 [IU] via SUBCUTANEOUS
  Administered 2018-08-30: 15 [IU] via SUBCUTANEOUS
  Administered 2018-08-30: 11 [IU] via SUBCUTANEOUS
  Administered 2018-08-30 (×2): 7 [IU] via SUBCUTANEOUS
  Administered 2018-08-31: 4 [IU] via SUBCUTANEOUS
  Administered 2018-08-31: 3 [IU] via SUBCUTANEOUS
  Administered 2018-08-31 (×2): 7 [IU] via SUBCUTANEOUS
  Administered 2018-08-31: 4 [IU] via SUBCUTANEOUS
  Administered 2018-08-31 – 2018-09-01 (×2): 3 [IU] via SUBCUTANEOUS
  Administered 2018-09-02 (×3): 4 [IU] via SUBCUTANEOUS
  Administered 2018-09-02: 3 [IU] via SUBCUTANEOUS
  Administered 2018-09-03: 4 [IU] via SUBCUTANEOUS
  Administered 2018-09-03 – 2018-09-06 (×11): 3 [IU] via SUBCUTANEOUS
  Administered 2018-09-06 – 2018-09-07 (×2): 4 [IU] via SUBCUTANEOUS
  Administered 2018-09-07: 3 [IU] via SUBCUTANEOUS
  Administered 2018-09-07: 4 [IU] via SUBCUTANEOUS
  Administered 2018-09-08 – 2018-09-11 (×6): 3 [IU] via SUBCUTANEOUS
  Administered 2018-09-11: 4 [IU] via SUBCUTANEOUS
  Administered 2018-09-13 – 2018-09-18 (×13): 3 [IU] via SUBCUTANEOUS
  Administered 2018-09-19: 4 [IU] via SUBCUTANEOUS
  Administered 2018-09-19 – 2018-09-20 (×3): 3 [IU] via SUBCUTANEOUS
  Administered 2018-09-20: 4 [IU] via SUBCUTANEOUS
  Administered 2018-09-20 – 2018-09-22 (×4): 3 [IU] via SUBCUTANEOUS
  Administered 2018-09-23: 4 [IU] via SUBCUTANEOUS
  Administered 2018-09-23 – 2018-09-25 (×5): 3 [IU] via SUBCUTANEOUS
  Administered 2018-09-26 (×2): 4 [IU] via SUBCUTANEOUS
  Administered 2018-09-26 – 2018-09-28 (×6): 3 [IU] via SUBCUTANEOUS
  Administered 2018-09-29: 4 [IU] via SUBCUTANEOUS
  Administered 2018-09-29: 3 [IU] via SUBCUTANEOUS
  Administered 2018-09-29 (×2): 4 [IU] via SUBCUTANEOUS
  Administered 2018-09-29: 3 [IU] via SUBCUTANEOUS
  Administered 2018-09-30: 4 [IU] via SUBCUTANEOUS
  Administered 2018-09-30: 0 [IU] via SUBCUTANEOUS
  Administered 2018-09-30: 3 [IU] via SUBCUTANEOUS
  Administered 2018-09-30: 6 [IU] via SUBCUTANEOUS
  Administered 2018-09-30 – 2018-10-01 (×4): 3 [IU] via SUBCUTANEOUS
  Administered 2018-10-01 (×3): 4 [IU] via SUBCUTANEOUS
  Administered 2018-10-02 – 2018-10-05 (×17): 3 [IU] via SUBCUTANEOUS
  Administered 2018-10-05: 4 [IU] via SUBCUTANEOUS
  Administered 2018-10-05: 3 [IU] via SUBCUTANEOUS
  Administered 2018-10-06: 4 [IU] via SUBCUTANEOUS
  Administered 2018-10-06 (×2): 3 [IU] via SUBCUTANEOUS
  Administered 2018-10-06 – 2018-10-07 (×2): 4 [IU] via SUBCUTANEOUS
  Administered 2018-10-07 – 2018-10-11 (×18): 3 [IU] via SUBCUTANEOUS
  Administered 2018-10-11: 4 [IU] via SUBCUTANEOUS
  Administered 2018-10-11 – 2018-10-12 (×6): 3 [IU] via SUBCUTANEOUS
  Administered 2018-10-13 (×2): 4 [IU] via SUBCUTANEOUS
  Administered 2018-10-13: 3 [IU] via SUBCUTANEOUS
  Administered 2018-10-13: 4 [IU] via SUBCUTANEOUS
  Administered 2018-10-14 – 2018-10-16 (×10): 3 [IU] via SUBCUTANEOUS
  Administered 2018-10-16: 7 [IU] via SUBCUTANEOUS
  Administered 2018-10-17: 3 [IU] via SUBCUTANEOUS
  Administered 2018-10-17: 7 [IU] via SUBCUTANEOUS
  Administered 2018-10-17: 3 [IU] via SUBCUTANEOUS
  Administered 2018-10-17: 4 [IU] via SUBCUTANEOUS
  Administered 2018-10-18: 3 [IU] via SUBCUTANEOUS
  Administered 2018-10-18: 4 [IU] via SUBCUTANEOUS

## 2018-08-14 MED ORDER — FENTANYL CITRATE (PF) 100 MCG/2ML IJ SOLN
100.0000 ug | INTRAMUSCULAR | Status: DC | PRN
  Administered 2018-08-14: 100 ug via INTRAVENOUS
  Filled 2018-08-14: qty 2

## 2018-08-14 MED ORDER — SODIUM CHLORIDE 0.9 % IV SOLN
INTRAVENOUS | Status: DC
  Administered 2018-08-14: 17:00:00 via INTRAVENOUS

## 2018-08-14 MED ORDER — ASPIRIN 81 MG PO CHEW
324.0000 mg | CHEWABLE_TABLET | Freq: Once | ORAL | Status: AC
  Administered 2018-08-14: 324 mg via ORAL
  Filled 2018-08-14: qty 4

## 2018-08-14 MED ORDER — ONDANSETRON HCL 4 MG/2ML IJ SOLN
4.0000 mg | Freq: Four times a day (QID) | INTRAMUSCULAR | Status: DC | PRN
  Administered 2018-09-17 – 2018-10-22 (×3): 4 mg via INTRAVENOUS
  Filled 2018-08-14 (×3): qty 2

## 2018-08-14 MED ORDER — SENNOSIDES-DOCUSATE SODIUM 8.6-50 MG PO TABS: 1.0000 | ORAL_TABLET | Freq: Every evening | ORAL | Status: DC | PRN

## 2018-08-14 MED ORDER — HYDRALAZINE HCL 20 MG/ML IJ SOLN
INTRAMUSCULAR | Status: AC
  Administered 2018-08-14: 20 mg via INTRAVENOUS
  Filled 2018-08-14: qty 1

## 2018-08-14 MED ORDER — MORPHINE SULFATE (PF) 2 MG/ML IV SOLN: 2.0000 mg | INTRAVENOUS | Status: DC | PRN

## 2018-08-14 MED ORDER — PROPOFOL 10 MG/ML IV BOLUS
INTRAVENOUS | Status: DC | PRN
  Administered 2018-08-14: 200 mg via INTRAVENOUS

## 2018-08-14 MED ORDER — TICAGRELOR 90 MG PO TABS
90.0000 mg | ORAL_TABLET | Freq: Two times a day (BID) | ORAL | Status: DC
  Administered 2018-08-15 – 2018-08-19 (×10): 90 mg
  Filled 2018-08-14 (×9): qty 1

## 2018-08-14 MED ORDER — NITROGLYCERIN 0.4 MG SL SUBL: 0.4000 mg | SUBLINGUAL_TABLET | SUBLINGUAL | Status: DC | PRN

## 2018-08-14 MED ORDER — ASPIRIN 81 MG PO CHEW
81.0000 mg | CHEWABLE_TABLET | Freq: Every day | ORAL | Status: DC
  Administered 2018-08-16 – 2018-11-13 (×89): 81 mg
  Filled 2018-08-14 (×88): qty 1

## 2018-08-14 MED ORDER — CLOPIDOGREL BISULFATE 300 MG PO TABS
ORAL_TABLET | ORAL | Status: AC
  Filled 2018-08-14: qty 1

## 2018-08-14 MED ORDER — ORAL CARE MOUTH RINSE
15.0000 mL | OROMUCOSAL | Status: DC
  Administered 2018-08-15 – 2018-08-27 (×120): 15 mL via OROMUCOSAL

## 2018-08-14 MED ORDER — CEFAZOLIN SODIUM-DEXTROSE 2-3 GM-%(50ML) IV SOLR
INTRAVENOUS | Status: DC | PRN
  Administered 2018-08-14: 2 g via INTRAVENOUS

## 2018-08-14 MED ORDER — SODIUM CHLORIDE 0.9 % IV SOLN
INTRAVENOUS | Status: DC | PRN
  Administered 2018-08-14: 10 ug/min via INTRAVENOUS

## 2018-08-14 MED ORDER — NITROGLYCERIN 1 MG/10 ML FOR IR/CATH LAB
INTRA_ARTERIAL | Status: AC
  Filled 2018-08-14: qty 10

## 2018-08-14 MED ORDER — ENOXAPARIN SODIUM 60 MG/0.6ML ~~LOC~~ SOLN
60.0000 mg | SUBCUTANEOUS | Status: DC
  Administered 2018-08-14: 60 mg via SUBCUTANEOUS
  Filled 2018-08-14: qty 0.6

## 2018-08-14 MED ORDER — ATORVASTATIN CALCIUM 20 MG PO TABS: 20.0000 mg | ORAL_TABLET | Freq: Every day | ORAL | Status: DC

## 2018-08-14 MED ORDER — SUGAMMADEX SODIUM 200 MG/2ML IV SOLN
INTRAVENOUS | Status: DC | PRN
  Administered 2018-08-14: 300 mg via INTRAVENOUS

## 2018-08-14 MED ORDER — ACETAMINOPHEN 325 MG PO TABS: 650.0000 mg | ORAL_TABLET | ORAL | Status: DC | PRN

## 2018-08-14 MED ORDER — ASPIRIN EC 81 MG PO TBEC: 81.0000 mg | DELAYED_RELEASE_TABLET | Freq: Every day | ORAL | Status: DC

## 2018-08-14 MED ORDER — REMIFENTANIL HCL 2 MG IV SOLR
INTRAVENOUS | Status: AC
  Filled 2018-08-14: qty 2000

## 2018-08-14 MED ORDER — PROPOFOL 10 MG/ML IV BOLUS
INTRAVENOUS | Status: AC
  Filled 2018-08-14: qty 20

## 2018-08-14 MED ORDER — PHENYLEPHRINE 40 MCG/ML (10ML) SYRINGE FOR IV PUSH (FOR BLOOD PRESSURE SUPPORT)
PREFILLED_SYRINGE | INTRAVENOUS | Status: AC
  Filled 2018-08-14: qty 10

## 2018-08-14 MED ORDER — INSULIN ASPART 100 UNIT/ML ~~LOC~~ SOLN: 0.0000 [IU] | Freq: Every day | SUBCUTANEOUS | Status: DC

## 2018-08-14 MED ORDER — INSULIN ASPART 100 UNIT/ML ~~LOC~~ SOLN: 0.0000 [IU] | Freq: Three times a day (TID) | SUBCUTANEOUS | Status: DC

## 2018-08-14 MED ORDER — FAMOTIDINE IN NACL 20-0.9 MG/50ML-% IV SOLN
20.0000 mg | Freq: Two times a day (BID) | INTRAVENOUS | Status: DC
  Administered 2018-08-15 – 2018-08-19 (×9): 20 mg via INTRAVENOUS
  Filled 2018-08-14 (×10): qty 50

## 2018-08-14 MED ORDER — PROPOFOL 1000 MG/100ML IV EMUL
0.0000 ug/kg/min | INTRAVENOUS | Status: DC
  Administered 2018-08-14: 50 ug/kg/min via INTRAVENOUS
  Administered 2018-08-15 (×2): 40 ug/kg/min via INTRAVENOUS
  Filled 2018-08-14 (×5): qty 100

## 2018-08-14 MED ORDER — FENTANYL CITRATE (PF) 100 MCG/2ML IJ SOLN
100.0000 ug | INTRAMUSCULAR | Status: DC | PRN
  Administered 2018-08-15: 100 ug via INTRAVENOUS
  Filled 2018-08-14: qty 2

## 2018-08-14 MED ORDER — SODIUM CHLORIDE 0.9 % IV SOLN
INTRAVENOUS | Status: DC | PRN
  Administered 2018-08-14 (×2): via INTRAVENOUS

## 2018-08-14 MED ORDER — IOPAMIDOL (ISOVUE-300) INJECTION 61%
INTRAVENOUS | Status: AC
  Administered 2018-08-14: 5 mL
  Filled 2018-08-14: qty 50

## 2018-08-14 MED ORDER — CLEVIDIPINE BUTYRATE 0.5 MG/ML IV EMUL
0.0000 mg/h | INTRAVENOUS | Status: DC
  Administered 2018-08-14 (×3): 21 mg/h via INTRAVENOUS
  Administered 2018-08-14: 20 mg/h via INTRAVENOUS
  Administered 2018-08-15: 8 mg/h via INTRAVENOUS
  Administered 2018-08-15 (×2): 21 mg/h via INTRAVENOUS
  Administered 2018-08-15: 15 mg/h via INTRAVENOUS
  Filled 2018-08-14: qty 100
  Filled 2018-08-14 (×9): qty 50

## 2018-08-14 MED ORDER — CLEVIDIPINE BUTYRATE 0.5 MG/ML IV EMUL
INTRAVENOUS | Status: DC | PRN
  Administered 2018-08-14: 2 mg/h via INTRAVENOUS

## 2018-08-14 MED ORDER — ASPIRIN 325 MG PO TABS
ORAL_TABLET | ORAL | Status: AC
  Filled 2018-08-14: qty 1

## 2018-08-14 MED ORDER — LABETALOL HCL 5 MG/ML IV SOLN
10.0000 mg | INTRAVENOUS | Status: AC | PRN
  Administered 2018-08-17 – 2018-08-19 (×10): 20 mg via INTRAVENOUS
  Filled 2018-08-14 (×11): qty 4

## 2018-08-14 MED ORDER — METOPROLOL TARTRATE 5 MG/5ML IV SOLN
INTRAVENOUS | Status: AC
  Administered 2018-08-14: 5 mg via INTRAVENOUS
  Filled 2018-08-14: qty 5

## 2018-08-14 MED ORDER — ASPIRIN 81 MG PO CHEW
81.0000 mg | CHEWABLE_TABLET | Freq: Every day | ORAL | Status: DC
  Administered 2018-08-15 – 2018-08-24 (×2): 81 mg via ORAL
  Filled 2018-08-14 (×5): qty 1

## 2018-08-14 MED ORDER — CLEVIDIPINE BUTYRATE 0.5 MG/ML IV EMUL
INTRAVENOUS | Status: AC
  Filled 2018-08-14: qty 50

## 2018-08-14 MED ORDER — ALBUTEROL SULFATE (2.5 MG/3ML) 0.083% IN NEBU
2.5000 mg | INHALATION_SOLUTION | RESPIRATORY_TRACT | Status: DC | PRN
  Administered 2018-08-18 – 2018-11-02 (×5): 2.5 mg via RESPIRATORY_TRACT
  Filled 2018-08-14 (×5): qty 3

## 2018-08-14 MED ORDER — METOPROLOL TARTRATE 5 MG/5ML IV SOLN
2.5000 mg | INTRAVENOUS | Status: DC | PRN
  Administered 2018-08-14 (×2): 5 mg via INTRAVENOUS
  Filled 2018-08-14 (×2): qty 5

## 2018-08-14 MED ORDER — AMLODIPINE BESYLATE 10 MG PO TABS: 10.0000 mg | ORAL_TABLET | Freq: Every day | ORAL | Status: DC

## 2018-08-14 MED ORDER — ACETAMINOPHEN 650 MG RE SUPP: 650.0000 mg | RECTAL | Status: DC | PRN

## 2018-08-14 MED ORDER — ROCURONIUM BROMIDE 50 MG/5ML IV SOLN
100.0000 mg | Freq: Once | INTRAVENOUS | Status: AC
  Administered 2018-08-14: 100 mg via INTRAVENOUS
  Filled 2018-08-14: qty 10

## 2018-08-14 MED ORDER — SODIUM CHLORIDE 0.9 % IV SOLN
INTRAVENOUS | Status: AC
  Filled 2018-08-14: qty 100

## 2018-08-14 MED ORDER — NITROGLYCERIN 1 MG/10 ML FOR IR/CATH LAB
INTRA_ARTERIAL | Status: DC | PRN
  Administered 2018-08-14: 25 ug via INTRA_ARTERIAL

## 2018-08-14 MED ORDER — INSULIN GLARGINE 100 UNIT/ML ~~LOC~~ SOLN: 13.0000 [IU] | Freq: Every day | SUBCUTANEOUS | Status: DC

## 2018-08-14 MED ORDER — TIROFIBAN HCL IN NACL 5-0.9 MG/100ML-% IV SOLN
INTRAVENOUS | Status: AC
  Filled 2018-08-14: qty 100

## 2018-08-14 MED ORDER — METOPROLOL SUCCINATE ER 50 MG PO TB24: 50.0000 mg | ORAL_TABLET | Freq: Every day | ORAL | Status: DC

## 2018-08-14 MED ORDER — NITROGLYCERIN 0.4 MG SL SUBL
0.4000 mg | SUBLINGUAL_TABLET | SUBLINGUAL | Status: DC | PRN
  Administered 2018-08-14 (×3): 0.4 mg via SUBLINGUAL
  Filled 2018-08-14: qty 1

## 2018-08-14 MED ORDER — EPTIFIBATIDE 20 MG/10ML IV SOLN
INTRAVENOUS | Status: AC
  Filled 2018-08-14: qty 10

## 2018-08-14 MED ORDER — CEFAZOLIN SODIUM-DEXTROSE 2-4 GM/100ML-% IV SOLN
INTRAVENOUS | Status: AC
  Filled 2018-08-14: qty 100

## 2018-08-14 MED ORDER — SODIUM CHLORIDE 0.9 % IV SOLN
INTRAVENOUS | Status: DC
  Administered 2018-08-15 – 2018-08-22 (×6): via INTRAVENOUS

## 2018-08-14 MED ORDER — TICAGRELOR 60 MG PO TABS
ORAL_TABLET | ORAL | Status: DC | PRN
  Administered 2018-08-14: 180 mg via ORAL

## 2018-08-14 MED ORDER — INSULIN ASPART 100 UNIT/ML ~~LOC~~ SOLN
0.0000 [IU] | Freq: Three times a day (TID) | SUBCUTANEOUS | Status: DC
  Administered 2018-08-14: 11 [IU] via SUBCUTANEOUS

## 2018-08-14 MED ORDER — FENTANYL CITRATE (PF) 250 MCG/5ML IJ SOLN
INTRAMUSCULAR | Status: AC
  Filled 2018-08-14: qty 5

## 2018-08-14 NOTE — Progress Notes (Signed)
Continued hypertension despite max dose of of cleviprex gtt.    P:  Add prn IV lopressor, consider labetalol gtt Check  UDS     Kennieth Rad, AGACNP-BC [facility] Pgr: [phone] or if no answer [phone] 08/07/2018, 5:54 PM

## 2018-08-14 NOTE — Progress Notes (Signed)
RT assisted CCM NP with pt intubation. Pt intubated with a 7.0 ETT, 23 @ lip, CP 26, commercial tube holder in place and to the center. RT also assisted CCM MD with bronch after intubation. Pt on ordered 8cc/kg. Measured pt to be 64" with a VT of 430 (8cc/kg/IBW).   VT 430, rate 15, 100% FiO2, 5 PEEP.  Pt currently satting at 99%. RT will continue to monitor.

## 2018-08-14 NOTE — Progress Notes (Signed)
PT Cancellation Note  Patient Details Name: Andrea [name] MRN: [id number] DOB: 09-04-1961   Cancelled Treatment:    Reason Eval/Treat Not Completed: Patient at procedure or test/unavailable. Pt in IR following a code stroke. PT to re-attempt eval as time allows and when pt medically ready.   Lorriane Shire 08/24/2018, 1:09 PM   Lorrin Goodell, PT  Office # [phone] Pager [phone]

## 2018-08-14 NOTE — Progress Notes (Signed)
I inquired about doing the echo but the patient was in interventional radiology for several hours. We will check again later or in the AM.

## 2018-08-14 NOTE — ED Notes (Signed)
Patient transported to CT 

## 2018-08-14 NOTE — ED Notes (Signed)
ED TO INPATIENT HANDOFF REPORT  Name/Age/Gender Andrea [name] 57 y.o. female  Code Status    Code Status Orders  (From admission, onward)         Start     Ordered   08/27/2018 0251  Full code  Continuous     08/09/2018 0252        Code Status History    Date Active Date Inactive Code Status Order ID Comments User Context   01/11/2017 2150 01/15/2017 1950 Full Code [id number]  Sela Hilding, MD Inpatient   12/14/2012 0206 12/15/2012 1930 Full Code [id number]  Theressa Millard, MD ED   12/29/2011 [phone] 12/31/2011 1934 Full Code [id number]  Despina Arias, RN Inpatient      Home/SNF/Other Home  Chief Complaint chest pain numbness in left arm   Level of Care/Admitting Diagnosis ED Disposition    ED Disposition Condition [place]: [place] [[id number]]  Level of Care: Telemetry [5]  Diagnosis: Chest pain [678938]  Admitting Physician: Vianne Bulls [[id number]]  Attending Physician: Vianne Bulls [[id number]]  PT Class (Do Not Modify): Observation [104]  PT Acc Code (Do Not Modify): Observation [10022]       Medical History Past Medical History:  Diagnosis Date  . Diabetes mellitus   . Environmental allergies   . Fibroid   . High cholesterol   . Hx of cardiovascular stress test    a. ETT-MV 2/14:  EF 46% (normal by echo), low risk, no ischemia or scar  . Hx of echocardiogram    Echo 2/14: mild LVH, EF 55-60%, Gr 1 diast dysfn, mild LAE  . Hypertension   . Hyperthyroidism    Radioactive iodine ablation    Allergies Allergies  Allergen Reactions  . Tramadol     "Makes me sees things and I just dont act right"    IV Location/Drains/Wounds Patient Lines/Drains/Airways Status   Active Line/Drains/Airways    Name:   Placement date:   Placement time:   Site:   Days:   Peripheral IV 08/12/2018 Left Antecubital   08/12/2018    0005    Antecubital   less than 1   Incision   -    1930        Incision   -    1930               Labs/Imaging Results for orders placed or performed during the hospital encounter of 08/29/2018 (from the past 48 hour(s))  Basic metabolic panel     Status: Abnormal   Collection Time: 08/16/2018 10:08 PM  Result Value Ref Range   Sodium 141 135 - 145 mmol/L   Potassium 3.7 3.5 - 5.1 mmol/L   Chloride 107 98 - 111 mmol/L   CO2 24 22 - 32 mmol/L   Glucose, Bld 251 (H) 70 - 99 mg/dL   BUN 17 6 - 20 mg/dL   Creatinine, Ser 1.28 (H) 0.44 - 1.00 mg/dL   Calcium 10.8 (H) 8.9 - 10.3 mg/dL   GFR calc non Af Amer 45 (L) >60 mL/min   GFR calc Af Amer 53 (L) >60 mL/min    Comment: (NOTE) The eGFR has been calculated using the CKD EPI equation. This calculation has not been validated in all clinical situations. eGFR's persistently <60 mL/min signify possible Chronic Kidney Disease.    Anion gap 10 5 - 15    Comment: Performed at [facility], [place]  [address], [place], [place] [address]  CBC     Status: Abnormal   Collection Time: 08/19/2018 10:08 PM  Result Value Ref Range   WBC 8.3 4.0 - 10.5 K/uL   RBC 4.55 3.87 - 5.11 MIL/uL   Hemoglobin 10.8 (L) 12.0 - 15.0 g/dL   HCT 35.7 (L) 36.0 - 46.0 %   MCV 78.5 (L) 80.0 - 100.0 fL   MCH 23.7 (L) 26.0 - 34.0 pg   MCHC 30.3 30.0 - 36.0 g/dL   RDW 15.3 11.5 - 15.5 %   Platelets 409 (H) 150 - 400 K/uL   nRBC 0.0 0.0 - 0.2 %    Comment: Performed at [facility], [place] [address] [place], [place] [address]  I-Stat beta hCG blood, ED     Status: None   Collection Time: 09/04/2018 10:16 PM  Result Value Ref Range   I-stat hCG, quantitative <5.0 <5 mIU/mL   Comment 3            Comment:   GEST. AGE      CONC.  (mIU/mL)   <=1 WEEK        5 - 50     2 WEEKS       50 - 500     3 WEEKS       100 - 10,000     4 WEEKS     1,000 - 30,000        FEMALE AND NON-PREGNANT FEMALE:     LESS THAN 5 mIU/mL   POCT i-Stat troponin I     Status: None   Collection Time: 08/24/2018 10:20 PM  Result Value Ref Range   Troponin i,  poc 0.02 0.00 - 0.08 ng/mL   Comment 3            Comment: Due to the release kinetics of cTnI, a negative result within the first hours of the onset of symptoms does not rule out myocardial infarction with certainty. If myocardial infarction is still suspected, repeat the test at appropriate intervals.   Brain natriuretic peptide     Status: Abnormal   Collection Time: 08/27/2018 12:08 AM  Result Value Ref Range   B Natriuretic Peptide 116.0 (H) 0.0 - 100.0 pg/mL    Comment: Performed at [facility], [place] [address], [place], [place] [address]  TSH     Status: None   Collection Time: 08/17/2018 12:08 AM  Result Value Ref Range   TSH 1.338 0.350 - 4.500 uIU/mL    Comment: Performed by a 3rd Generation assay with a functional sensitivity of <=0.01 uIU/mL. Performed at [facility], [place] [address] [place],  [address]   Urinalysis, Routine w reflex microscopic     Status: Abnormal   Collection Time: 08/20/2018  3:11 AM  Result Value Ref Range   Color, Urine YELLOW YELLOW   APPearance CLEAR CLEAR   Specific Gravity, Urine 1.014 1.005 - 1.030   pH 5.0 5.0 - 8.0   Glucose, UA 150 (A) NEGATIVE mg/dL   Hgb urine dipstick SMALL (A) NEGATIVE   Bilirubin Urine NEGATIVE NEGATIVE   Ketones, ur NEGATIVE NEGATIVE mg/dL   Protein, ur 30 (A) NEGATIVE mg/dL   Nitrite NEGATIVE NEGATIVE   Leukocytes, UA NEGATIVE NEGATIVE   RBC / HPF 0-5 0 - 5 RBC/hpf   WBC, UA 6-10 0 - 5 WBC/hpf   Bacteria, UA NONE SEEN NONE SEEN   Squamous Epithelial / LPF 0-5 0 - 5  Mucus PRESENT    Hyaline Casts, UA PRESENT     Comment: Performed at [facility], [place] [address] [place], [place] [address]  Troponin I (q 6hr x 3)     Status: Abnormal   Collection Time: 08/28/2018  3:17 AM  Result Value Ref Range   Troponin I 0.03 (HH) <0.03 ng/mL    Comment: CRITICAL RESULT CALLED TO, READ BACK BY AND VERIFIED WITHLysle Dingwall RN [id number] 08/05/2018 A NAVARRO Performed  at [facility], [place] [address] [place], [place] [address]    Dg Chest 2 View  Result Date: 08/30/2018 CLINICAL DATA:  Chest pain starting yesterday. EXAM: CHEST - 2 VIEW COMPARISON:  12/16/2017 FINDINGS: Stable cardiomegaly with atherosclerotic, slightly ectatic thoracic aorta. Central pulmonary vascular congestion is noted without pulmonary consolidation. No effusion or pneumothorax. Thoracic spondylosis is noted. No acute osseous abnormality is seen. IMPRESSION: Cardiomegaly with mild central vascular congestion. Electronically Signed   By: Ashley Royalty M.D.   On: 08/05/2018 22:49   Ct Head Wo Contrast  Result Date: 08/19/2018 CLINICAL DATA:  Funny feeling in the left arm. Slurred speech. History of hypertension and diabetes. EXAM: CT HEAD WITHOUT CONTRAST TECHNIQUE: Contiguous axial images were obtained from the base of the skull through the vertex without intravenous contrast. COMPARISON:  None. FINDINGS: Brain: No evidence of acute infarction, hemorrhage, hydrocephalus, extra-axial collection or mass lesion/mass effect. Patchy white matter low attenuation changes likely represent early small vessel ischemic change. Vascular: No hyperdense vessel or unexpected calcification. Skull: Normal. Negative for fracture or focal lesion. Sinuses/Orbits: Paranasal sinuses and mastoid air cells are clear. Other: None. IMPRESSION: No acute intracranial abnormalities. Mild white matter changes likely due to small vessel ischemia. Electronically Signed   By: Lucienne Capers M.D.   On: 08/17/2018 03:33   EKG Interpretation  Date/Time:  Wednesday August 13 2018 22:35:25 EDT Ventricular Rate:  69 PR Interval:    QRS Duration: 110 QT Interval:  459 QTC Calculation: 492 R Axis:   16 Text Interpretation:  Sinus rhythm Probable left atrial enlargement Abnormal R-wave progression, early transition Left ventricular hypertrophy Borderline prolonged QT interval repolarization changes on prior  ECG resolved Reconfirmed by Addison Lank [phone]) on 08/05/2018 12:36:55 AM   Pending Labs Unresulted Labs (From admission, onward)    Start     Ordered   08/21/18 0500  Creatinine, serum  (enoxaparin (LOVENOX)    CrCl >/= 30 ml/min)  Weekly,   R    Comments:  while on enoxaparin therapy    08/15/2018 0252   08/23/2018 0500  HIV antibody (Routine Testing)  Tomorrow morning,   R     08/12/2018 0252   08/10/2018 5701  Basic metabolic panel  Tomorrow morning,   R     08/15/2018 0252   08/20/2018 0237  Troponin I (q 6hr x 3)  Now then every 6 hours,   R     09/02/2018 0236          Vitals/Pain Today's Vitals   08/24/2018 0230 09/02/2018 0314 08/12/2018 0330 09/02/2018 0400  BP: (!) 145/98  (!) 151/91 (!) 153/78  Pulse:      Resp: 13  19 19   Temp:      TempSrc:      SpO2:   100% 95%  Weight:      Height:      PainSc:  2       Isolation Precautions No active isolations  Medications Medications  aspirin EC tablet  81 mg (has no administration in time range)  amLODipine (NORVASC) tablet 10 mg (has no administration in time range)  atorvastatin (LIPITOR) tablet 20 mg (has no administration in time range)  cloNIDine (CATAPRES) tablet 0.3 mg (has no administration in time range)  hydrochlorothiazide (HYDRODIURIL) tablet 25 mg (has no administration in time range)  losartan (COZAAR) tablet 25 mg (has no administration in time range)  metoprolol succinate (TOPROL-XL) 24 hr tablet 50 mg (has no administration in time range)  gabapentin (NEURONTIN) capsule 300 mg (has no administration in time range)  benzonatate (TESSALON) capsule 100 mg (has no administration in time range)  nitroGLYCERIN (NITROSTAT) SL tablet 0.4 mg (has no administration in time range)  acetaminophen (TYLENOL) tablet 650 mg (has no administration in time range)  ondansetron (ZOFRAN) injection 4 mg (has no administration in time range)  enoxaparin (LOVENOX) injection 40 mg (has no administration in time range)  insulin glargine  (LANTUS) injection 13 Units (has no administration in time range)  insulin aspart (novoLOG) injection 0-9 Units (has no administration in time range)  insulin aspart (novoLOG) injection 0-5 Units (has no administration in time range)  morphine 2 MG/ML injection 2-4 mg (has no administration in time range)  amLODipine (NORVASC) tablet 10 mg (10 mg Oral Given 08/17/2018 0011)  cloNIDine (CATAPRES) tablet 0.2 mg (0.2 mg Oral Given 08/07/2018 0011)  hydrochlorothiazide (MICROZIDE) capsule 25 mg (25 mg Oral Given 08/31/2018 0012)  losartan (COZAAR) tablet 25 mg (25 mg Oral Given 08/05/2018 0015)  morphine 4 MG/ML injection 4 mg (4 mg Intravenous Given 08/12/2018 0014)  ondansetron (ZOFRAN) injection 4 mg (4 mg Intravenous Given 08/17/2018 0014)  aspirin chewable tablet 324 mg (324 mg Oral Given 08/11/2018 0100)

## 2018-08-14 NOTE — Progress Notes (Signed)
PCCM Interval Progress Note  Persistent hypertension despite max cleviprex and 30mcg/kg/min.  Also gave 20mg  hydralazine which did result in drop for 5 - 10 minutes; however, SBP now back to 200.  Have placed PRN hydralazine and labetalol orders.  RN instructed to continue propofol gtt as we anticipate BP to improve as better sedation is achieved.  OGT to be placed for 10pm Brilinta and ASA.   Montey Hora, [place] [facility] Pager: [phone]  or [phone] 08/25/2018, 9:55 PM

## 2018-08-14 NOTE — Procedures (Addendum)
Bronchoscopy Procedure Note DANNYA PITKIN [id number] [birth year]  Procedure: Bronchoscopy Indications: Diagnostic evaluation of the airways and respiratory insufficiency for assessment of endotracheal tube position.  Procedure Details Consent: Unable to obtain consent because of emergent medical necessity. Time Out: Verified patient identification, verified procedure, site/side was marked, verified correct patient position, special equipment/implants available, medications/allergies/relevent history reviewed, required imaging and test results available.  Performed  In preparation for procedure, patient was given 100% FiO2. Sedation: Benzodiazepines  Airway entered and the following bronchi were examined:  Right main bronchus and left main bronchus which showed bronchomalacia there was a lot of edema in her trachea also and some bleeding coming from above the end of the endotracheal tube with some edema of her airway.  Endotracheal tube was above the carina.   Evaluation Hemodynamic Status: BP stable throughout; O2 sats: stable throughout Patient's Current Condition: stable Specimens:  None Complications: No apparent complications Patient did tolerate procedure well.   Estill Batten Z Aljishi 08/13/2018

## 2018-08-14 NOTE — Procedures (Signed)
Intubation Procedure Note Andrea [name] [id number] May 04, 1961  Procedure: Intubation Indications: Respiratory insufficiency  Procedure Details Consent: Unable to obtain consent because of emergent medical necessity. Time Out: Verified patient identification, verified procedure, site/side was marked, verified correct patient position, special equipment/implants available, medications/allergies/relevent history reviewed, required imaging and test results available.  Performed  Drugs:  100 mcg Fentanyl, 8 mg Versed, 30 mg Etomidate, 100 mg Rocuronium. VL x 2 with # 3 blade.  Initially attempted 7.5 ETT but met resistance at vocal cords due to edema.  Changed to 7.0 tube and still had resistance; however, ETT successfully passed beyond vocal cords. Grade 1 view. Following intubation:  positive color change on ETCO2, condensation seen in endotracheal tube, equal breath sounds bilaterally.  Of note: during video laryngoscopy, blood noted in posterior oropharynx and vocal cords.  Suctioned but continued to bleed.  ETT passed and plan is to perform bronchoscopy for further evaluation.  Evaluation Hemodynamic Status: BP stable throughout; O2 sats: stable throughout Patient's Current Condition: stable Complications: No apparent complications Patient did tolerate procedure well. Chest X-ray ordered to verify placement.  CXR: pending.   Montey Hora, [place] [facility] Pager: [phone]  or [phone] 08/08/2018, 9:33 PM

## 2018-08-14 NOTE — Consult Note (Addendum)
Neurology Consultation  Reason for Consult: Acute code stroke on the floor Referring Physician: Dr. Eliseo Squires  CC: Slurred speech, right-sided weakness  History is obtained from: Chart review and patient  HPI: Andrea [name] is a 57 y.o. female past medical history for difficult to control hypertension, insulin-dependent diabetes, obesity, hypothyroidism status post ablation presented to the ED for evaluation of chest pain at [facility] last night and was sent over to [facility] for further work-up and evaluation this morning.  Upon assessment by the nursing staff and the medical staff this morning, she was noted to have slurred speech and right-sided weakness, for which a code stroke was called.  During the initial evaluation in the code stroke paged out, last known normal was unknown. Upon obtaining history from patient, she reports that she could not sleep last night and around 3 AM noticed that she has some chest discomfort and right-sided weakness and also noted that her speech was slurred.  She was taken to [facility] where she underwent evaluation-CT head, labs EKG.  She was noted to have an elevated blood pressure of 220/117 with an EKG revealing sinus rhythm and left ventricular hypertrophy.  Her chest x-ray was notable for cardiomegaly and vascular congestion but nothing acute that would explain chest pain.  Her troponin was negative.  She was admitted as a ACS rule out/work-up. On the floor this morning, the nursing staff around the time the code stroke was called, noted that she was having more difficulty talking and her speech had become extremely thick and slurred.  She also exhibited right facial droop.  She was weak on her right arm and leg as well. On initial assessment in her room, the patient was extremely dysarthric and had right hemiparesis with right facial droop.  No sensory changes, no cortical signs. She was taken for a noncontrast CT of the head which  is unremarkable and unchanged from the one obtained at [facility]. 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  Her symptoms during this course faxed and vein.  Discussions were made with interventional radiology, Dr. Estanislado Pandy and patient was wheeled to the IR suite for a possible emergent cerebral angiogram followed by angioplasty/stenting of the basilar by in the IR suite, prior to being on the table, patient symptoms started to improve and her facial droop had disappeared and her leg strength started to improve.  Then the decision was made to pursue an MRI prior to taking her for a cerebral angiogram to ascertain whether this is a brainstem stroke or a subcortical lacunar infarct that was fluctuating. She was taken in for the MRI stat and it revealed an evolving pontine stroke.  She was brought back to the interventional radiology suite and consented for diagnostic cerebral angiogram and basilar artery revascularization.  Currently the patient is on the table at the time of this dictation  LKW: 0300 08/08/2018 tpa given?: no, outside the window Premorbid modified Rankin scale (mRS): 1  ROS: ROS was performed and is negative except as noted in the HPI.   Past Medical History:  Diagnosis Date  . Diabetes mellitus   . Environmental allergies   . Fibroid   . High cholesterol   . Hx of cardiovascular stress test    a. ETT-MV 2/14:  EF 46% (normal by echo), low risk, no ischemia or scar  . Hx of  echocardiogram    Echo 2/14: mild LVH, EF 55-60%, Gr 1 diast dysfn, mild LAE  . Hypertension   . Hyperthyroidism    Radioactive iodine ablation    Family History  Problem Relation Age of Onset  . Pneumonia Mother   . Diabetes Mother   . Hypertension Father   . Hypertension Sister   . Hyperlipidemia Sister   . Hypertension Brother   . Hypertension Maternal Aunt   . Hypertension Paternal  Aunt   . Cancer Paternal Aunt        throat cancer  . Diabetes Maternal Grandmother   . Diabetes Paternal Grandmother   . Cancer Paternal Grandmother        pancreatic cancer   Social History:   reports that she has never smoked. She has never used smokeless tobacco. She reports that she does not drink alcohol or use drugs.  Medications  Current Facility-Administered Medications:  .  acetaminophen (TYLENOL) tablet 650 mg, 650 mg, Oral, Q4H PRN, Opyd, Timothy S, MD .  amLODipine (NORVASC) tablet 10 mg, 10 mg, Oral, Daily, Opyd, Timothy S, MD .  aspirin 325 MG tablet, , , ,  .  aspirin EC tablet 81 mg, 81 mg, Oral, Daily, Opyd, Ilene Qua, MD .  atorvastatin (LIPITOR) tablet 20 mg, 20 mg, Oral, q1800, Opyd, Ilene Qua, MD .  benzonatate (TESSALON) capsule 100 mg, 100 mg, Oral, TID PRN, Opyd, Ilene Qua, MD .  cloNIDine (CATAPRES) tablet 0.3 mg, 0.3 mg, Oral, BID, Opyd, Timothy S, MD .  clopidogrel (PLAVIX) 300 MG tablet, , , ,  .  enoxaparin (LOVENOX) injection 60 mg, 60 mg, Subcutaneous, Q24H, Opyd, Ilene Qua, MD, 60 mg at 08/19/2018 0559 .  eptifibatide (INTEGRILIN) 20 MG/10ML injection, , , ,  .  gabapentin (NEURONTIN) capsule 300 mg, 300 mg, Oral, TID PRN, Opyd, Ilene Qua, MD .  hydrochlorothiazide (HYDRODIURIL) tablet 25 mg, 25 mg, Oral, Daily, Opyd, Timothy S, MD .  insulin aspart (novoLOG) injection 0-5 Units, 0-5 Units, Subcutaneous, QHS, Opyd, Timothy S, MD .  insulin aspart (novoLOG) injection 0-9 Units, 0-9 Units, Subcutaneous, TID WC, Opyd, Timothy S, MD .  insulin glargine (LANTUS) injection 13 Units, 13 Units, Subcutaneous, QHS, Opyd, Timothy S, MD .  lidocaine (XYLOCAINE) 1 % (with pres) injection, , , ,  .  losartan (COZAAR) tablet 25 mg, 25 mg, Oral, Daily, Opyd, Timothy S, MD .  metoprolol succinate (TOPROL-XL) 24 hr tablet 50 mg, 50 mg, Oral, Daily, Opyd, Timothy S, MD .  morphine 2 MG/ML injection 2-4 mg, 2-4 mg, Intravenous, Q4H PRN, Opyd, Ilene Qua, MD .  nitroGLYCERIN  (NITROSTAT) SL tablet 0.4 mg, 0.4 mg, Sublingual, Q5 Min x 3 PRN, Opyd, Timothy S, MD .  nitroGLYCERIN 100 mcg/mL intra-arterial injection, , , ,  .  ondansetron (ZOFRAN) injection 4 mg, 4 mg, Intravenous, Q6H PRN, Opyd, Ilene Qua, MD .  ticagrelor (BRILINTA) 90 MG tablet, , , ,  .  tirofiban (AGGRASTAT) 5-0.9 MG/100ML-% injection, , , ,   Exam: Current vital signs: BP (!) 178/89   Pulse 64   Temp 98.5 F (36.9 C) (Oral)   Resp 19   Ht 5' 6.5" (1.689 m)   Wt 124.6 kg   LMP 07/26/2018   SpO2 100%   BMI 43.67 kg/m  Vital signs in last 24 hours: Temp:  [98.5 F (36.9 C)-98.7 F (37.1 C)] 98.5 F (36.9 C) (10/10 0504) Pulse Rate:  [64-77] 64 (10/10 0504) Resp:  [13-27] 19 (10/10 0400)  BP: (145-220)/(78-119) 178/89 (10/10 0900) SpO2:  [95 %-100 %] 100 % (10/10 0504) Weight:  [122.5 kg-124.6 kg] 124.6 kg (10/10 0452)  GENERAL: Awake, alert in NAD HEENT: - Normocephalic and atraumatic, dry mm, no LN++, no Thyromegally LUNGS - Clear to auscultation bilaterally with no wheezes CV - S1S2 RRR, no m/r/g, equal pulses bilaterally. ABDOMEN - Soft, nontender, nondistended with normoactive BS Ext: warm, well perfused, intact peripheral pulses, trace edema bilaterally  NEURO:  Mental Status: AA&Ox3  Language: speech is severely dysarthric.  Naming, repetition, fluency, and comprehension intact. Cranial Nerves: PERRL EOMI, visual fields full, positive for facial asymmetry with large right lower facial weakness, facial sensation intact, hearing intact, tongue/uvula/soft palate midline, normal sternocleidomastoid and trapezius muscle strength. No evidence of tongue atrophy or fibrillations Motor: Barely 3/5 in the right upper extremity, 2/5 in the right lower extremity, left upper and lower extremity are full strength. Tone: is normal and bulk is normal Sensation- Intact to light touch bilaterally Coordination: FTN intact bilaterally, no ataxia in BLE. Gait- deferred  Initial NIH-5 Repeat  NIH in IR suite prior to obtaining MRI-4 Repeat NIH after MRI 6  Labs I have reviewed labs in epic and the results pertinent to this consultation are:  CBC    Component Value Date/Time   WBC 8.3 09/02/2018 2208   RBC 4.55 08/19/2018 2208   HGB 10.8 (L) 09/01/2018 2208   HCT 35.7 (L) 08/09/2018 2208   PLT 409 (H) 08/21/2018 2208   MCV 78.5 (L) 08/07/2018 2208   MCH 23.7 (L) 08/06/2018 2208   MCHC 30.3 09/01/2018 2208   RDW 15.3 09/01/2018 2208   LYMPHSABS 1.8 01/23/2018 1352   MONOABS 1.0 01/23/2018 1352   EOSABS 0.0 01/23/2018 1352   BASOSABS 0.0 01/23/2018 1352    CMP     Component Value Date/Time   NA 140 08/21/2018 0317   K 3.4 (L) 08/15/2018 0317   CL 106 08/05/2018 0317   CO2 22 08/26/2018 0317   GLUCOSE 252 (H) 08/06/2018 0317   BUN 17 08/16/2018 0317   CREATININE 1.15 (H) 08/29/2018 0317   CREATININE 0.91 04/09/2016 1213   CALCIUM 10.3 08/23/2018 0317   PROT 7.8 01/23/2018 1352   ALBUMIN 3.0 (L) 01/23/2018 1352   AST 14 (L) 01/23/2018 1352   ALT 11 (L) 01/23/2018 1352   ALKPHOS 92 01/23/2018 1352   BILITOT 0.8 01/23/2018 1352   GFRNONAA 52 (L) 09/03/2018 0317   GFRNONAA 71 04/09/2016 1213   GFRAA >60 08/09/2018 0317   GFRAA 82 04/09/2016 1213    Lipid Panel     Component Value Date/Time   CHOL 230 (H) 05/25/2014 1105   TRIG 158 (H) 05/25/2014 1105   HDL 55 05/25/2014 1105   CHOLHDL 4.2 05/25/2014 1105   VLDL 32 05/25/2014 1105   LDLCALC 143 (H) 05/25/2014 1105   Imaging I have reviewed the images obtained:  CT-scan of the brain-unremarkable CTA head and neck-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  Mild stenosis of left MCA and moderate stenosis of left MCA bifurcation.  MRI examination of the brain-official read pending.  Small area of restricted diffusion in the pons and pontomedullary junction.  Assessment:  57 year old woman with a past medical history as above  presenting initially for evaluation of chest pain also complained of right-sided weakness and slurred speech which gradually worsened and also waxed and waned over the course of the night.  Last known  normal was 3 AM and she is outside the window for IV TPA because she never returned back to baseline and her last known normal was 3 AM and a code stroke was called past 4-1/2 hours at that time. 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 Case was discussed with interventional radiologist Dr. Estanislado Pandy and the patient was taken in for an emergent diagnostic cerebral angiogram followed by possible angioplasty and basilar stenting for the basilar revascularization. Most likely etiology is atherosclerotic disease due to the multiple risk factors that she has.  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  If revascularized, goal blood pressure systolic less than 449 otherwise allow for permissive hypertension. -MRI/ECHO/A1C/Lipid panel. -Hyperglycemia management per SSI to maintain glucose 140-180mg /dL. -PT/OT/ST therapies and recommendations when able  CNS -Close neuro monitoring  Dysarthria Dysphagia following cerebral infarction  -NPO until cleared by speech -ST -Advance diet as tolerated  Hemiplegia and hemiparesis following cerebral infarction affecting right  dominant side -PT/OT -PM&R consult  RESP Acute Respiratory Failure  -vent management per ICU -wean when able  CV Essential (primary) hypertension Hypertensive Emergency Hypertensive Urgency -Pressure goals as above -Transthoracic echo   Chest pain -Cards following.  Appreciate consult.  Follow current recommendations  Hyperlipidemia, unspecified  - Statin for goal LDL < 70   HEME Iron Deficiency Anemia -Monitor -transfuse for hgb < 7  ENDO Type 2 diabetes mellitus with hyperglycemia  -SSI -Start oral meds -goal HgbA1c < 7  GI/GU CKD -Gentle hydration  Fluid/Electrolyte Disorders -Repeat labs -Replete as necessary   Nutrition E66.9 Obesity  -diet consult  Prophylaxis DVT: SCDs for now.  Unless there is a concern for bleed after procedure, can start heparin subcu. GI: Per vent bundle if remains intubated otherwise not indicated Bowel: Docusate/senna  Diet: NPO until cleared by speech or bedside swallow  Code Status: Full Code -- Amie Portland, MD [facility] Pager: [phone] If 7pm to 7am, please call on call as listed on AMION.   CRITICAL CARE ATTESTATION This patient is critically ill and at significant risk of neurological worsening, death and care requires constant monitoring of vital signs, hemodynamics, respiratory, and cardiac monitoring. I spent 60  minutes of neurocritical care time performing neurological assessment, discussion with family, other specialists and medical decision making of high complexity in the care of  this patient.

## 2018-08-14 NOTE — Anesthesia Preprocedure Evaluation (Signed)
Anesthesia Evaluation  Preop documentation limited or incomplete due to emergent nature of procedure.  Airway        Dental   Pulmonary           Cardiovascular hypertension, Pt. on medications and Pt. on home beta blockers   ECG: SR, rate 65   Neuro/Psych    GI/Hepatic   Endo/Other  diabetes, Insulin Dependent, Oral Hypoglycemic AgentsHyperthyroidism   Renal/GU      Musculoskeletal   Abdominal   Peds  Hematology  (+) anemia , HLD   Anesthesia Other Findings code stroke  Reproductive/Obstetrics                             Anesthesia Physical Anesthesia Plan  ASA: III and emergent  Anesthesia Plan: General   Post-op Pain Management:    Induction: Intravenous  PONV Risk Score and Plan: 3 and Treatment may vary due to age or medical condition  Airway Management Planned: Oral ETT  Additional Equipment:   Intra-op Plan:   Post-operative Plan: Extubation in OR and Possible Post-op intubation/ventilation  Informed Consent: I have reviewed the patients History and Physical, chart, labs and discussed the procedure including the risks, benefits and alternatives for the proposed anesthesia with the patient or authorized representative who has indicated his/her understanding and acceptance.   Dental advisory given  Plan Discussed with: CRNA  Anesthesia Plan Comments:         Anesthesia Quick Evaluation

## 2018-08-14 NOTE — Anesthesia Postprocedure Evaluation (Signed)
Anesthesia Post Note  Patient: Andrea [name]  Procedure(s) Performed: IR WITH ANESTHESIA (N/A )     Patient location during evaluation: PACU Anesthesia Type: General Level of consciousness: responds to stimulation Pain management: pain level controlled Vital Signs Assessment: post-procedure vital signs reviewed and stable Respiratory status: spontaneous breathing and respiratory function stable (BiPAP) Cardiovascular status: blood pressure returned to baseline and stable Postop Assessment: no apparent nausea or vomiting Anesthetic complications: no Comments: Patient maintaining saturation in PACU, but demonstrating increased work of breathing. Patient placed on BiPAP for additional ventilatory support in PACU prior to transport to ICU.    Last Vitals:  Vitals:   08/06/2018 1900 08/26/2018 2000  BP: (!) 155/82 (!) 158/84  Pulse: (!) 102 77  Resp: (!) 26 (!) 32  Temp:  37.3 C  SpO2: 98% 98%    Last Pain:  Vitals:   08/13/2018 2000  TempSrc: Oral  PainSc:                  [name] [name]

## 2018-08-14 NOTE — Sedation Documentation (Signed)
Sheath removed, 19fr angioseal closure device used. Manual pressure being held @ right groin site.

## 2018-08-14 NOTE — Progress Notes (Signed)
SLP Cancellation Note  Patient Details Name: Andrea [name] MRN: [id number] DOB: 1961/09/23   Cancelled treatment:       Reason Eval/Treat Not Completed: Patient not medically ready. In IR today after code stroke called. Will follow for needs.    Raymel Cull, [name] 08/08/2018, 11:37 AM

## 2018-08-14 NOTE — Code Documentation (Signed)
Pt arrived to MRI to verify Basilar occlusion

## 2018-08-14 NOTE — Progress Notes (Signed)
Late entry: Received report from night shift RN at bedside, Tonny Branch NP at bedside and noted slurred speech and change in right sided strength; noted to have worsened since arrival.  Neuro exam completed and NIHSS completed with score of 6 and pronator drift right side.  PA S. Wertner to see patient during neuro assessment and requested code stroke be called.  Charge nurse notified and Code stroke called.  Dr Weldon Picking arrived at bedside, updated on situation. Patient taken to CT emergently.  Notifed patient went directly to IR and will be transferred to 4N.  Report called to 4N by S. Advertising account planner.  Patient belongings sent with patient's mother.

## 2018-08-14 NOTE — Code Documentation (Signed)
Pt waiting for MRI and Stroke Team decided to take patient directly to IR. Pt arrived to IR and IR MD requested MRI be completed due to mild symptoms and verify the basilar occlusion. Pt continue to have right sided weakness, facial droop, and slurred speech. Arrived back in MRI at [phone].

## 2018-08-14 NOTE — Progress Notes (Signed)
Patient ID: Andrea [name], female   DOB: 1961/10/09, 57 y.o.   MRN: [id number] INR. 38 Y RT H F Premorbid mRSS 0 LSW 3 am ?. New onset dysarthria , rt sided weakness RT facial droop. CT brain NO ICH ASPECTS 10 CTA severe distal basilar artery stenosis. MRI Brain Lt parapontine ischemia DWI restriction.  Option of endovascular revascularization of preocclusive basilar artery stenosis D/W patient and son. Procedure,risks,alternatives reviewed.Risks of ICH of 10 %,worsening neuro function,vent dependency,death inability to revascularize vascular injury all reviewed.Questions answered to their satifaction.Informed witnessed consent obtained . Arlean Hopping MD

## 2018-08-14 NOTE — Anesthesia Procedure Notes (Signed)
Arterial Line Insertion Start/End10/23/2019 10:00 AM, 08/14/2018 10:13 AM Performed by: Josephine Igo, CRNA, CRNA  Patient location: OOR procedure area. Preanesthetic checklist: patient identified, IV checked, risks and benefits discussed and surgical consent Lidocaine 1% used for infiltration Left, radial was placed Catheter size: 20 G Hand hygiene performed , maximum sterile barriers used  and Seldinger technique used  Attempts: 1 Procedure performed without using ultrasound guided technique. Following insertion, dressing applied and Biopatch. Post procedure assessment: normal  Patient tolerated the procedure well with no immediate complications.

## 2018-08-14 NOTE — Consult Note (Addendum)
Cardiology Consultation:   Patient ID: Andrea [name]; [id number]; 09/05/1961   Admit date: 09/04/2018 Date of Consult: 08/12/2018  Primary Care Provider: Danna Hefty, DO Primary Cardiologist: Dr. Gwen Pounds   Patient Profile:   Andrea [name] is a 57 y.o. female with a hx of resistant hypertension, insulin-dependent DM 2, obesity, iron deficiency anemia and hyperthyroidism s/p ablation who is being seen today for the evaluation of chest pain at the request of Dr. Myna Hidalgo.  History of Present Illness:   Andrea [name] is a 57 year old female with a history as stated above who initially presented to [facility] on 08/13/2017 with complaints of mid-sternal chest and left arm pain. She reports running out of her medications approximately 3 days ago and was doing well however, developed chest pain on Tuesday evening. She states that the chest pain subsided on its own and she proceeded to go to bed. She woke on Wednesday morning and "didn't feel well" and once again had chest discomfort described as a "pressure" that waxed and waned throughtout the morning. She had no associated shortness of breath, no diaphoresis, nausea or vomiting. Given her ongoing symptoms, she proceeded to the ED for further evaluation.  She denies palpitations, orthopnea symptoms, LE swelling, recent fevers, chills, URI, dizziness, presyncopal or syncopal episodes.   In the ED, BP was markedly elevated at 220/117.  EKG performed revealed NSR with evidence of LVH.  CXR with cardiomegaly and mild central vascular congestion.  Creatinine was slightly elevated at 1.28 however, improved from prior.  I-STAT troponin, 0.02 with subsequent draw at 0.03.  BNP mildly elevated at 116.  She was given 324 mg of ASA, nitroglycerin, Norvasc, Klonopin, HCTZ, losartan, morphine and Zofran in the emergency department.  Since then, her chest discomfort resolved and her blood pressure improved.  She was last seen by our service  12/31/2012 for a nuclear stress test performed secondary to chest pain, dizziness and palpitations. Her study showed poor exercise capacity with hypertensive blood pressure response she had based on anterior lateral and inferior ST depression with no significant change with exercise, consider low risk stress nuclear study.  She had no significant perfusion defect and no evidence of ischemia or infarction with global hypokinesis with an LVEF estimated at 46%.  Prior to that she was evaluated in 2013 for similar complaints of chest discomfort, found to be in hypertensive emergency with BP of 250/150 in the ED. At that time, she once again reported being out of her medications for several months. She was noted to have been lost to follow-up in primary care and was looking to establish with Cardiology.   Today upon our interview, she appears to be having difficulty with her speech and has right arm weakness, noted to be different from her baseline per patient report. She cannot fully recall when her speech or weakness became abnormal and it is fairly difficult to understand her today. She had a CT scan at approximately 0300 while at [facility] which was found to be normal, without signs of acute abnormalities. I have paged  primary team for further assistance. There is no mention of acute neurological abnormalities while at [facility]. In speaking with the night RN, she was reported to have had right arm weakness, however states that this, along with her speech, has become worse since initial assessment. Stat MRI ordered per primary team.   Past Medical History:  Diagnosis Date  . Diabetes mellitus   . Environmental allergies   . Fibroid   .  High cholesterol   . Hx of cardiovascular stress test    a. ETT-MV 2/14:  EF 46% (normal by echo), low risk, no ischemia or scar  . Hx of echocardiogram    Echo 2/14: mild LVH, EF 55-60%, Gr 1 diast dysfn, mild LAE  . Hypertension   . Hyperthyroidism    Radioactive iodine ablation      Past Surgical History:  Procedure Laterality Date  . [place], 2001   x 2     Prior to Admission medications   Medication Sig Start Date End Date Taking? Authorizing Provider  amLODipine (NORVASC) 10 MG tablet Take 1 tablet (10 mg total) by mouth daily. 05/16/18  Yes Elsie Stain, MD  aspirin EC 81 MG tablet Take 81 mg by mouth daily.   Yes [provider]  atorvastatin (LIPITOR) 20 MG tablet Take 1 tablet (20 mg total) by mouth daily. 01/29/18  Yes Verner Mould, MD  benzonatate (TESSALON) 100 MG capsule Take 1 capsule (100 mg total) by mouth every 8 (eight) hours. 12/16/17  Yes Maczis, Barth Kirks, PA-C  cloNIDine (CATAPRES) 0.2 MG tablet Take 1.5 tablets (0.3 mg total) by mouth 2 (two) times daily. 05/16/18  Yes Elsie Stain, MD  ferrous sulfate 325 (65 FE) MG EC tablet Take 1 tablet (325 mg total) by mouth 3 (three) times daily with meals. 01/17/17  Yes Hensel, Jamal Collin, MD  gabapentin (NEURONTIN) 300 MG capsule Take 1 capsule (300 mg total) by mouth 3 (three) times daily. Patient taking differently: Take 300 mg by mouth 3 (three) times daily as needed (nerve pain).  12/26/16  Yes Lysbeth Penner, FNP  hydrochlorothiazide (HYDRODIURIL) 25 MG tablet TAKE 1 TABLET (25 MG TOTAL) BY MOUTH DAILY. 05/16/18  Yes Elsie Stain, MD  ibuprofen (ADVIL,MOTRIN) 800 MG tablet Take 1 tablet (800 mg total) by mouth every 8 (eight) hours as needed. Patient taking differently: Take 800 mg by mouth every 8 (eight) hours as needed for headache or mild pain.  01/19/17  Yes Lawyer, Harrell Gave, PA-C  Insulin Glargine (LANTUS SOLOSTAR) 100 UNIT/ML Solostar Pen INJECT 27 UNITS INTO THE SKIN AT BEDTIME. 05/16/18  Yes Elsie Stain, MD  losartan (COZAAR) 25 MG tablet Take 1 tablet (25 mg total) by mouth daily. 05/16/18  Yes Elsie Stain, MD  megestrol (MEGACE) 40 MG tablet TAKE 2 TABLETS BY MOUTH TWICE A DAY , MAY INCREASE TO 3 TABLETS TWICE DAILY FOR HEAVY  BLEEDING Patient taking differently: Take 80 mg by mouth 2 (two) times daily. May increase to 3 tablets twice daily for heavy bleeding 11/26/17  Yes Anyanwu, Sallyanne Havers, MD  metFORMIN (GLUCOPHAGE) 1000 MG tablet Take 1 tablet (1,000 mg total) by mouth 2 (two) times daily with a meal. 05/16/18  Yes Elsie Stain, MD  metoprolol succinate (TOPROL XL) 50 MG 24 hr tablet Take 1 tablet (50 mg total) by mouth daily. Take with or immediately following a meal. 05/16/18  Yes Elsie Stain, MD  VOLTAREN 1 % GEL APPLY 2 G TOPICALLY 4 (FOUR) TIMES DAILY. Patient taking differently: Apply 2 g topically 4 (four) times daily as needed (pain).  01/16/17  Yes Bacigalupo, Dionne Bucy, MD  Insulin Pen Needle (B-D ULTRAFINE III SHORT PEN) 31G X 8 MM MISC USE AS DIRECTED TO INJECT INSULIN AT BEDTIME 01/15/18   Verner Mould, MD  Insulin Syringe-Needle U-100 (INSULIN SYRINGE .5CC/30GX1/2") 30G X 1/2" 0.5 ML MISC Use to inject insulin daily as  prescribed 01/23/17   Virginia Crews, MD  sertraline (ZOLOFT) 50 MG tablet Take 50 mg by mouth daily.  01/19/12  [provider]    Inpatient Medications: Scheduled Meds: . amLODipine  10 mg Oral Daily  . aspirin EC  81 mg Oral Daily  . atorvastatin  20 mg Oral q1800  . cloNIDine  0.3 mg Oral BID  . enoxaparin (LOVENOX) injection  60 mg Subcutaneous Q24H  . hydrochlorothiazide  25 mg Oral Daily  . insulin aspart  0-5 Units Subcutaneous QHS  . insulin aspart  0-9 Units Subcutaneous TID WC  . insulin glargine  13 Units Subcutaneous QHS  . losartan  25 mg Oral Daily  . metoprolol succinate  50 mg Oral Daily   Continuous Infusions:  PRN Meds: acetaminophen, benzonatate, gabapentin, morphine injection, nitroGLYCERIN, ondansetron (ZOFRAN) IV  Allergies:    Allergies  Allergen Reactions  . Tramadol     "Makes me sees things and I just dont act right"    Social History:   Social History   Socioeconomic History  . Marital status: Single     Spouse name: Not on file  . Number of children: 3  . Years of education: Not on file  . Highest education level: Not on file  Occupational History  . Occupation: unemployed  Social Needs  . Financial resource strain: Not on file  . Food insecurity:    Worry: Not on file    Inability: Not on file  . Transportation needs:    Medical: Not on file    Non-medical: Not on file  Tobacco Use  . Smoking status: Never Smoker  . Smokeless tobacco: Never Used  Substance and Sexual Activity  . Alcohol use: No    Comment: occasional for special occasions  . Drug use: No  . Sexual activity: Yes  Lifestyle  . Physical activity:    Days per week: Not on file    Minutes per session: Not on file  . Stress: Not on file  Relationships  . Social connections:    Talks on phone: Not on file    Gets together: Not on file    Attends religious service: Not on file    Active member of club or organization: Not on file    Attends meetings of clubs or organizations: Not on file    Relationship status: Not on file  . Intimate partner violence:    Fear of current or ex partner: Not on file    Emotionally abused: Not on file    Physically abused: Not on file    Forced sexual activity: Not on file  Other Topics Concern  . Not on file  Social History Narrative  . Not on file    Family History:   Family History  Problem Relation Age of Onset  . Pneumonia Mother   . Diabetes Mother   . Hypertension Father   . Hypertension Sister   . Hyperlipidemia Sister   . Hypertension Brother   . Hypertension Maternal Aunt   . Hypertension Paternal Aunt   . Cancer Paternal Aunt        throat cancer  . Diabetes Maternal Grandmother   . Diabetes Paternal Grandmother   . Cancer Paternal Grandmother        pancreatic cancer   Family Status:  Family Status  Relation Name Status  . Mother  Deceased  . Father  Deceased  . Sister  (Not Specified)  . Brother  (Not Specified)  .  Mat Aunt  (Not Specified)    . Ethlyn Daniels  (Not Specified)  . MGM  (Not Specified)  . PGM  (Not Specified)    ROS:  Please see the history of present illness.  All other ROS reviewed and negative.     Physical Exam/Data:   Vitals:   08/08/2018 0400 08/30/2018 0452 08/19/2018 0501 08/05/2018 0504  BP: (!) 153/78  (!) 174/84 (!) 174/84  Pulse:   64 64  Resp: 19     Temp:   98.5 F (36.9 C) 98.5 F (36.9 C)  TempSrc:   Oral Oral  SpO2: 95%  100% 100%  Weight:  124.6 kg    Height:  5' 6.5" (1.689 m)     No intake or output data in the 24 hours ending 08/06/2018 0626 Filed Weights   08/31/2018 2150 09/02/2018 0452  Weight: 122.5 kg 124.6 kg   Body mass index is 43.67 kg/m.   General: Well developed, well nourished, NAD Skin: Warm, dry, intact  Head: Normocephalic, atraumatic, clear, moist mucus membranes. Neck: Negative for carotid bruits. No JVD Lungs:Clear to ausculation bilaterally. No wheezes, rales, or rhonchi. Breathing is unlabored. Cardiovascular: RRR with S1 S2. No murmurs, rubs, gallops, or LV heave appreciated. Abdomen: Soft, non-tender, non-distended with normoactive bowel sounds. No obvious abdominal masses. MSK: Strength and tone appear normal for age. 5/5 in all extremities Extremities: Mild BLE 1+ edema. No clubbing or cyanosis. DP/PT pulses 2+ bilaterally Neuro: Alert and oriented. Focal deficits to right upper extremity. There is facial asymmetry. MAE spontaneously, however with right upper and lower extremity weakness.  Psych: Responds to questions appropriately with normal affect.     EKG:  The EKG was personally reviewed and demonstrates: 08/07/2018 NSR with no acute abnormalitites Telemetry:  Telemetry was personally reviewed and demonstrates:  08/28/2018 NSR   Relevant CV Studies:  ECHO: 02/25/12014: LV EF: 55% -  60%  ------------------------------------------------------------ Indications:   Abnormal EKG 794.31. Chest pain  786.51.  ------------------------------------------------------------ History:  PMH: Acquired from the patient and from the patient's chart. Chest pain. Risk factors: Hypertension. Diabetes mellitus. Morbidly obese. Dyslipidemia.  ------------------------------------------------------------ Study Conclusions  - Left ventricle: The cavity size was mildly dilated. There was mild concentric hypertrophy. Systolic function was normal. The estimated ejection fraction was in the range of 55% to 60%. Wall motion was normal; there were no regional wall motion abnormalities. Doppler parameters are consistent with abnormal left ventricular relaxation (grade 1 diastolic dysfunction). - Left atrium: The atrium was mildly dilated.  Nuclear Stress Test 12/31/12: Impression Exercise Capacity:  Poor exercise capacity. BP Response:  Hypertensive blood pressure response. Clinical Symptoms:  Dyspnea.  ECG Impression:  NSR with IVCD at baseline.  Patient had baseline anterolateral and inferior ST depression, probably no significant changes with exercise.  Comparison with Prior Nuclear Study: No images to compare  CATH: None   Laboratory Data:  Chemistry Recent Labs  Lab 08/19/2018 2208  NA 141  K 3.7  CL 107  CO2 24  GLUCOSE 251*  BUN 17  CREATININE 1.28*  CALCIUM 10.8*  GFRNONAA 45*  GFRAA 53*  ANIONGAP 10    Total Protein  Date Value Ref Range Status  01/23/2018 7.8 6.5 - 8.1 g/dL Final   Albumin  Date Value Ref Range Status  01/23/2018 3.0 (L) 3.5 - 5.0 g/dL Final   AST  Date Value Ref Range Status  01/23/2018 14 (L) 15 - 41 U/L Final   ALT  Date Value Ref Range Status  01/23/2018 11 (L) 14 - 54 U/L Final   Alkaline Phosphatase  Date Value Ref Range Status  01/23/2018 92 38 - 126 U/L Final   Total Bilirubin  Date Value Ref Range Status  01/23/2018 0.8 0.3 - 1.2 mg/dL Final   Hematology Recent Labs  Lab 08/16/2018 2208  WBC 8.3  RBC 4.55  HGB  10.8*  HCT 35.7*  MCV 78.5*  MCH 23.7*  MCHC 30.3  RDW 15.3  PLT 409*   Cardiac Enzymes Recent Labs  Lab 09/03/2018 0317  TROPONINI 0.03*    Recent Labs  Lab 08/20/2018 2220  TROPIPOC 0.02    BNP Recent Labs  Lab 08/13/2018 0008  BNP 116.0*    DDimer No results for input(s): DDIMER in the last 168 hours. TSH:  Lab Results  Component Value Date   TSH 1.338 08/06/2018   Lipids: Lab Results  Component Value Date   CHOL 230 (H) 05/25/2014   HDL 55 05/25/2014   LDLCALC 143 (H) 05/25/2014   TRIG 158 (H) 05/25/2014   CHOLHDL 4.2 05/25/2014   HgbA1c: Lab Results  Component Value Date   HGBA1C 12.8 (H) 01/12/2017   Radiology/Studies:  Dg Chest 2 View  Result Date: 08/17/2018 CLINICAL DATA:  Chest pain starting yesterday. EXAM: CHEST - 2 VIEW COMPARISON:  12/16/2017 FINDINGS: Stable cardiomegaly with atherosclerotic, slightly ectatic thoracic aorta. Central pulmonary vascular congestion is noted without pulmonary consolidation. No effusion or pneumothorax. Thoracic spondylosis is noted. No acute osseous abnormality is seen. IMPRESSION: Cardiomegaly with mild central vascular congestion. Electronically Signed   By: Ashley Royalty M.D.   On: 08/05/2018 22:49   Ct Head Wo Contrast  Result Date: 08/07/2018 CLINICAL DATA:  Funny feeling in the left arm. Slurred speech. History of hypertension and diabetes. EXAM: CT HEAD WITHOUT CONTRAST TECHNIQUE: Contiguous axial images were obtained from the base of the skull through the vertex without intravenous contrast. COMPARISON:  None. FINDINGS: Brain: No evidence of acute infarction, hemorrhage, hydrocephalus, extra-axial collection or mass lesion/mass effect. Patchy white matter low attenuation changes likely represent early small vessel ischemic change. Vascular: No hyperdense vessel or unexpected calcification. Skull: Normal. Negative for fracture or focal lesion. Sinuses/Orbits: Paranasal sinuses and mastoid air cells are clear. Other:  None. IMPRESSION: No acute intracranial abnormalities. Mild white matter changes likely due to small vessel ischemia. Electronically Signed   By: Lucienne Capers M.D.   On: 08/31/2018 03:33   Assessment and Plan:   1. Chest pain: -Patient presented to [facility] ED on 08/29/2018 with complaints of chest and left arm discomfort, found to be in hypertensive crisis/urgency with a BP of 220/117 on presentation. She was given her home antihypertensives and NTG with CP resolution>>no recurrent symptoms since [facility] presentation. Unfortunately, upon interview today, the patient has right upper and lower extremity weakness with slurred speech. CT at [facility] at approximately 0300 was reviewed as normal>>>primiary team notified and stat MRI ordered.  -Initial i-STAT troponin negative at 0.02 with subsequent reading of 0.03>>denies chest pain today  -Patient given ASA 324 mg, NTG and morphine with chest pain resolution -Last stress test 2014 with no ischemia or infarction, low risk study -Last echocardiogram 12/30/2012 with LV EF of 55 to 60% with no wall motion abnormality G1 DD -High-grade risk factors include poorly controlled HTN and DM -EKG, NSR with no acute ischemic changes with mild evidence of LVH -Continue ASA, statin, beta-blocker -Given her new neurological changes, concerning for acute stroke, would not recommend further ischemic evaluation at this time.  Chest discomfort likely ion the setting of hypertensive crisis and less likely in the setting of ACS. If recurrent chest pain or elevation in troponin or EKG change occurs, could consider further workup at that time.  2. Neurological changes: -Pt presented to [facility] for chest discomfort found to be in hypertensive crisis with initial resenting BP of 220/117.  Patient was transported to [facility] for further cardiac evaluation however, upon interview today patient with slurred speech and right upper and lower extremity weakness which is noted to be a change from baseline per  patient report. -CT head [facility] at approximately 3 AM with no acute changes -Discussed plan of care with primary team who will come to see the patient in order stat MRI for further evaluation  3. Hypertension with hypertensive urgency: -BP 220/117 on presentation noted to be resistant, 174/84, 153/78, 151/91 -Reports not taking her medications for the last 3 days -BP improved with initiation of her medications and NTG gtt while in ED -Continue Norvasc, clonidine, losartan, HCTZ, Toprol-XL  3. Insulin dependent DM: -Last hemoglobin A1c 12.8 on 01/12/2017, uncontrolled -SSI for glucose control while inpatient status, per primary team -Would benefit from DM coordinator -Recheck more current hemoglobin A1c  4. CKD Stage III: -Creatinine, 1.28 today -Baseline appears to be in the 1.0-1.1 range -Continue to avoid nephrotoxic medications -Goal for optimal BP control  For questions or updates, please contact [facility] Please consult [email or web address] for contact info under Cardiology/STEMI.   SignedKathyrn Drown NP-C [facility] Pager: [phone] 09/04/2018 6:26 AM

## 2018-08-14 NOTE — Consult Note (Addendum)
NAME:  Andrea [name], MRN:  [id number], DOB:  07-Apr-1961, LOS: 0 ADMISSION DATE:  08/15/2018, CONSULTATION DATE:  08/11/2018 REFERRING MD:  Dr. Rory Percy, CHIEF COMPLAINT:  CP/ Acute CVA  Brief History   4 yoF w/poorly controlled HTN and IDDM presenting with CP and left arm pain w/BP 220/117. Initial CTH neg.  Cardiac workup negative for acute event thus far, cardiology following.  Code stroke initiated for R hemiplegia, dysarthria and right facial droop.  Out of window for TPA.  Requiring cleviprex for BP control.  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  Evolving pontine stroke on MRI. Intubated for neuro IR s/p stenting and angioplasty with 80-90% patency.  Extubated post procedure but due to insufficency placed on BiPAP and returns to ICU.    Past Medical History  HTN, IDDM, hypothyroidism s/p ablation  Significant Hospital Events   10/9 present to [facility] ED -transferred to [facility] 10/10 Neurology/ Neuro IR/ PCCM consult  Consults: date of consult/date signed off & final recs:  Cards 10/10 Neurology 10/10 PCCM 10/10  Procedures (surgical and bedside):  10/10 Neuro IR >> s/p stent and angioplasty of distal basilar artery  10/10 ETT for procedure 10/10 L R art line >>  Significant Diagnostic Tests:  08/25/2018  CTH >>  No acute intracranial abnormalities. Mild white matter changes likely due to small vessel ischemia  08/1018 CTH >> 1. No acute abnormality and no change from earlier today 2. ASPECTS is 10  08/07/2018  CTA head and neck >>  1. 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 No definite acute thrombus identified. 2. Severe stenosis left posterior cerebral artery and moderate stenosis right posterior cerebral artery. 3. Mild stenosis left MCA and moderate stenosis left MCA bifurcation. 4. No significant carotid or vertebral artery stenosis in the neck.   Micro Data:  08/22/2018 MRSA PCR  >>  Antimicrobials:  10/10 cefazolin preop  Subjective:  Patient denies current pain or SOB while on BiPAP   Objective   Blood pressure (!) 150/71, pulse 82, temperature 97.7 F (36.5 C), resp. rate 19, height 5\' 6"  (1.676 m), weight 128.3 kg, last menstrual period 07/26/2018, SpO2 100 %.    FiO2 (%):  [35 %] 35 %   Intake/Output Summary (Last 24 hours) at 08/25/2018 1606 Last data filed at 08/07/2018 1415 Gross per 24 hour  Intake 1300 ml  Output 1235 ml  Net 65 ml   Filed Weights   08/13/18 2150 09/02/2018 0452 08/19/2018 1430  Weight: 122.5 kg 124.6 kg 128.3 kg    Examination: General:  Adult AAF lying in reverse trendelenburg/ flat in bed in NAD HEENT: pupils 3/reactive, lateral nystagmus bilateral, full fitting bipap mask, large neck - off bipap patient has slurred speech and poor cough, insp/exp stridor off bipap Neuro: Awakens to voice, f/c, 5/5 strength left, minimal movement in RLE, RUE flaccid CV: SR, no murmur, R groin soft, dressing c/d/i, +2 pulses/ warm PULM: even/non-labored on Bipap, becomes tachypneic and labored off, lungs bilaterally coarse GI: obese, soft, non-tender, bs active  Extremities: warm/dry, trace BLE edema  Skin: no rashes  Resolved Hospital Problem list    Assessment & Plan:  Acute Ischemic 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 s/p stenting and angioplasty in neuro IR with 80-90% patency - right hemiplegia/ hemiparesis and facial droop with dysarthia P:  cleviprex for goal SBP < 150-160 Frequent neuro checks, at risk for decompensation Further  imaging per stroke team Pending lipid panel, TTE, carotid dopplers Asa and plavix per stroke SLP when able given dysarthia  Respiratory Insufficiency High risk for decompensation - extubated post IR - exp/insp stridor off bipap, otherwise on BiPAP is getting adequate TVs on 14/6 and oxygenating well on 35%, mental status- still able to f/c P:  Continue BiPAP Wean FiO2 for sats >  94% CXR now Monitor clinically for now for possible aspiration Strict NPO SLP to eval for dysphagia when appropriate HOB elevated as able  duoneb prn    Hypertensive Crisis - poorly controlled resistant hypertension P:  cleviprex as above for strict control Hold home meds given concern for aspiration/ SLP   Microcytic Anemia P:  Trend CBC Transfuse per protocol   Chest pain - no pain at present P:  Cardiology following  Troponin trend stable TTE as above   IDDM P:  CBG q 4 SSI  Hypothyroidism  - TSH 1.338 P:  Synthroid IV for now  Disposition / Summary of Today's Plan 08/30/2018   High risk for further respiratory and neurological decline given location/brainstem stroke.  Close monitoring and strict BP control.     Diet: NPO Pain/Anxiety/Delirium protocol (if indicated): N/a VAP protocol (if indicated): no DVT prophylaxis: SCDs  GI prophylaxis: n/a Hyperglycemia protocol: SSI Mobility: bedrest Code Status: Full  Family Communication: Patient updated.   Labs   CBC: Recent Labs  Lab 08/15/2018 2208 08/29/2018 1028  WBC 8.3 9.4  NEUTROABS  --  5.2  HGB 10.8* 11.1*  HCT 35.7* 36.0  MCV 78.5* 76.9*  PLT 409* 418*    Basic Metabolic Panel: Recent Labs  Lab 08/17/2018 2208 08/13/2018 0317 08/20/2018 1028  NA 141 140 136  K 3.7 3.4* 3.4*  CL 107 106 105  CO2 24 22 21*  GLUCOSE 251* 252* 255*  BUN 17 17 12   CREATININE 1.28* 1.15* 1.03*  CALCIUM 10.8* 10.3 10.6*   GFR: Estimated Creatinine Clearance: 82.7 mL/min (A) (by C-G formula based on SCr of 1.03 mg/dL (H)). Recent Labs  Lab 08/24/2018 2208 08/30/2018 1028  WBC 8.3 9.4    Liver Function Tests: No results for input(s): AST, ALT, ALKPHOS, BILITOT, PROT, ALBUMIN in the last 168 hours. No results for input(s): LIPASE, AMYLASE in the last 168 hours. No results for input(s): AMMONIA in the last 168 hours.  ABG    Component Value Date/Time   TCO2 26 12/29/2011 0340     Coagulation  Profile: Recent Labs  Lab 08/23/2018 1028  INR 1.02    Cardiac Enzymes: Recent Labs  Lab 09/02/2018 0317  TROPONINI 0.03*    HbA1C: Hemoglobin A1C  Date/Time Value Ref Range Status  04/09/2016 10:59 AM 10.8  Final  05/11/2014 02:41 PM 8.1  Final   Hgb A1c MFr Bld  Date/Time Value Ref Range Status  01/12/2017 02:43 AM 12.8 (H) 4.8 - 5.6 % Final    Comment:    (NOTE)         Pre-diabetes: 5.7 - 6.4         Diabetes: >6.4         Glycemic control for adults with diabetes: <7.0   12/14/2012 09:15 AM 15.1 (H) <5.7 % Final    Comment:    (NOTE)  According to the ADA Clinical Practice Recommendations for 2011, when HbA1c is used as a screening test:  >=6.5%   Diagnostic of Diabetes Mellitus           (if abnormal result is confirmed) 5.7-6.4%   Increased risk of developing Diabetes Mellitus References:Diagnosis and Classification of Diabetes Mellitus,Diabetes JHER,7408,14(GYJEH 1):S62-S69 and Standards of Medical Care in         Diabetes - 2011,Diabetes UDJS,9702,63 (Suppl 1):S11-S61.    CBG: Recent Labs  Lab 08/29/2018 0739 08/06/2018 1315  GLUCAP 244* 228*    Admitting History of Present Illness.   57 year old female with poorly controlled HTN and IDDM who presented to [facility] on 10/9 with chest pain and left arm discomfort found to have hypertensive crisis with BP 220/117.  Troponin negative and EKG with LVH pattern.  Treated with home HTN meds, NTG with resolution of chest pain. Additionally, patient noted to have new right upper and lower extremity weakness, right facial droop with slurred speech upon transfer to [facility].  LNW at 3am.  Code stroke initiated.  Initial CTH negative at [facility].  Patient not out of window for TPA.  Cardiology consulted, and given prior low risk studies, felt chest pain was related to hypertensive crisis and due to concern of acute stroke, deferred workup for ACS for now.  Repeat CTH  negative.  CTA head and neck revealed critical stenosis in the mid basilar region.  MRI performed showed evolving pontine stroke.  She was taken to neuro IR and intubated for procedure for emergent cerebral angiogram with stenting and angioplasty with 80-90% patency.  She was extubated post procedure however required BiPAP for diminished respiratory drive.  She continues to require cleviprex for ongoing hypertension.  PCCM consulted for further medical management in ICU.    Review of Systems:   Unable to fully obtain as patient is on BiPAP and dysarthric.   Past Medical History  She,  has a past medical history of Diabetes mellitus, Environmental allergies, Fibroid, High cholesterol, cardiovascular stress test, echocardiogram, Hypertension, and Hyperthyroidism.   Surgical History    Past Surgical History:  Procedure Laterality Date  . [place], 2001   x 2     Social History   Social History   Socioeconomic History  . Marital status: Single    Spouse name: Not on file  . Number of children: 3  . Years of education: Not on file  . Highest education level: Not on file  Occupational History  . Occupation: unemployed  Social Needs  . Financial resource strain: Not on file  . Food insecurity:    Worry: Not on file    Inability: Not on file  . Transportation needs:    Medical: Not on file    Non-medical: Not on file  Tobacco Use  . Smoking status: Never Smoker  . Smokeless tobacco: Never Used  Substance and Sexual Activity  . Alcohol use: No    Comment: occasional for special occasions  . Drug use: No  . Sexual activity: Yes  Lifestyle  . Physical activity:    Days per week: Not on file    Minutes per session: Not on file  . Stress: Not on file  Relationships  . Social connections:    Talks on phone: Not on file    Gets together: Not on file    Attends religious service: Not on file    Active member of club or organization: Not on file  Attends meetings  of clubs or organizations: Not on file    Relationship status: Not on file  . Intimate partner violence:    Fear of current or ex partner: Not on file    Emotionally abused: Not on file    Physically abused: Not on file    Forced sexual activity: Not on file  Other Topics Concern  . Not on file  Social History Narrative  . Not on file  ,  reports that she has never smoked. She has never used smokeless tobacco. She reports that she does not drink alcohol or use drugs.   Family History   Her family history includes Cancer in her paternal aunt and paternal grandmother; Diabetes in her maternal grandmother, mother, and paternal grandmother; Hyperlipidemia in her sister; Hypertension in her brother, father, maternal aunt, paternal aunt, and sister; Pneumonia in her mother.   Allergies Allergies  Allergen Reactions  . Tramadol     "Makes me sees things and I just dont act right"     Home Medications  Prior to Admission medications   Medication Sig Start Date End Date Taking? Authorizing Provider  amLODipine (NORVASC) 10 MG tablet Take 1 tablet (10 mg total) by mouth daily. 05/16/18  Yes Elsie Stain, MD  aspirin EC 81 MG tablet Take 81 mg by mouth daily.   Yes [provider]  atorvastatin (LIPITOR) 20 MG tablet Take 1 tablet (20 mg total) by mouth daily. 01/29/18  Yes Verner Mould, MD  benzonatate (TESSALON) 100 MG capsule Take 1 capsule (100 mg total) by mouth every 8 (eight) hours. 12/16/17  Yes Maczis, Barth Kirks, PA-C  cloNIDine (CATAPRES) 0.2 MG tablet Take 1.5 tablets (0.3 mg total) by mouth 2 (two) times daily. 05/16/18  Yes Elsie Stain, MD  ferrous sulfate 325 (65 FE) MG EC tablet Take 1 tablet (325 mg total) by mouth 3 (three) times daily with meals. 01/17/17  Yes Hensel, Jamal Collin, MD  gabapentin (NEURONTIN) 300 MG capsule Take 1 capsule (300 mg total) by mouth 3 (three) times daily. Patient taking differently: Take 300 mg by mouth 3 (three) times  daily as needed (nerve pain).  12/26/16  Yes Lysbeth Penner, FNP  hydrochlorothiazide (HYDRODIURIL) 25 MG tablet TAKE 1 TABLET (25 MG TOTAL) BY MOUTH DAILY. 05/16/18  Yes Elsie Stain, MD  ibuprofen (ADVIL,MOTRIN) 800 MG tablet Take 1 tablet (800 mg total) by mouth every 8 (eight) hours as needed. Patient taking differently: Take 800 mg by mouth every 8 (eight) hours as needed for headache or mild pain.  01/19/17  Yes Lawyer, Harrell Gave, PA-C  Insulin Glargine (LANTUS SOLOSTAR) 100 UNIT/ML Solostar Pen INJECT 27 UNITS INTO THE SKIN AT BEDTIME. 05/16/18  Yes Elsie Stain, MD  losartan (COZAAR) 25 MG tablet Take 1 tablet (25 mg total) by mouth daily. 05/16/18  Yes Elsie Stain, MD  megestrol (MEGACE) 40 MG tablet TAKE 2 TABLETS BY MOUTH TWICE A DAY , MAY INCREASE TO 3 TABLETS TWICE DAILY FOR HEAVY BLEEDING Patient taking differently: Take 80 mg by mouth 2 (two) times daily. May increase to 3 tablets twice daily for heavy bleeding 11/26/17  Yes Anyanwu, Sallyanne Havers, MD  metFORMIN (GLUCOPHAGE) 1000 MG tablet Take 1 tablet (1,000 mg total) by mouth 2 (two) times daily with a meal. 05/16/18  Yes Elsie Stain, MD  metoprolol succinate (TOPROL XL) 50 MG 24 hr tablet Take 1 tablet (50 mg total) by mouth daily. Take with or immediately  following a meal. 05/16/18  Yes Elsie Stain, MD  VOLTAREN 1 % GEL APPLY 2 G TOPICALLY 4 (FOUR) TIMES DAILY. Patient taking differently: Apply 2 g topically 4 (four) times daily as needed (pain).  01/16/17  Yes Bacigalupo, Dionne Bucy, MD  Insulin Pen Needle (B-D ULTRAFINE III SHORT PEN) 31G X 8 MM MISC USE AS DIRECTED TO INJECT INSULIN AT BEDTIME 01/15/18   Verner Mould, MD  Insulin Syringe-Needle U-100 (INSULIN SYRINGE .5CC/30GX1/2") 30G X 1/2" 0.5 ML MISC Use to inject insulin daily as prescribed 01/23/17   Virginia Crews, MD  sertraline (ZOLOFT) 50 MG tablet Take 50 mg by mouth daily.  01/19/12  [provider]     Critical care time:   28 mins    Kennieth Rad, AGACNP-BC [place] Pgr: [phone] or if no answer [phone] 08/12/2018, 5:12 PM

## 2018-08-14 NOTE — Progress Notes (Signed)
Assisted in transport of patient from PACU to 4N32 on BIPAP. No noted respiratory issues at this time.

## 2018-08-14 NOTE — Progress Notes (Signed)
Received call to place pt on BIPAP D/T diminished respiratory drive post op. No noted respiratory issues at this time.

## 2018-08-14 NOTE — Progress Notes (Signed)
Inpatient Diabetes Program Recommendations  AACE/ADA: New Consensus Statement on Inpatient Glycemic Control (2015)  Target Ranges:  Prepandial:   less than 140 mg/dL      Peak postprandial:   less than 180 mg/dL (1-2 hours)      Critically ill patients:  140 - 180 mg/dL   Review of Glycemic Control  Diabetes history: DM 2 Outpatient Diabetes medications: Lantus 27 units, Metformin 1000 mg BID Current orders for Inpatient glycemic control: Novolog 0-20 units tid  Current A1c to be drawn  Inpatient Diabetes Program Recommendations:    Patient with orders just released. Current glucose in the mid 200 range. Correction scale to be started. Could also consider half of home dose of Lantus while here, Lantus 15 units, if glucose trends remain elevated after correction scale is given.  Thanks,  Tama Headings RN, MSN, BC-ADM Inpatient Diabetes Coordinator Team Pager [phone] (8a-5p)

## 2018-08-14 NOTE — Transfer of Care (Signed)
Immediate Anesthesia Transfer of Care Note  Patient: Andrea [name]  Procedure(s) Performed: IR WITH ANESTHESIA (N/A )  Patient Location: PACU  Anesthesia Type:General  Level of Consciousness: awake and lethargic  Airway & Oxygen Therapy: Patient Spontanous Breathing and Patient connected to face mask oxygen  Post-op Assessment: Report given to RN, Post -op Vital signs reviewed and stable and moving L greater than right. Following some commands. Overall lethargic  Post vital signs: Reviewed and stable  Last Vitals:  Vitals Value Taken Time  BP 143/108 08/16/2018  1:13 PM  Temp 36.6 C 08/18/2018  1:15 PM  Pulse 88 08/07/2018  1:20 PM  Resp 16 08/28/2018  1:20 PM  SpO2 100 % 08/13/2018  1:20 PM  Vitals shown include unvalidated device data.  Last Pain:  Vitals:   08/29/2018 0504  TempSrc: Oral  PainSc: 3       Patients Stated Pain Goal: 0 (41/58/30 9407)  Complications: No apparent anesthesia complications

## 2018-08-14 NOTE — Procedures (Signed)
S/P 4 vessel cerebral arteriogram RT CFA approach. 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 S/P stent assisted angioplasty of distal basilar artery with patency of 80 to 90 %.  2.Approx 70 % stenosis of LT ICA supraclinoid seg

## 2018-08-14 NOTE — Code Documentation (Signed)
57 yo female coming from [facility] where she was noted to have some starting of right side weakness this morning at 0400. Pt went to the [facility] Emergency department last night due to midcenter chest pain. Pt was admitted and transferred to [place] at [place]. Pt reports being at her baseline at 0300 this morning. She was not able to fall asleep. The staff noted that she had some mild right side weakness at 0500 when she arrived to [facility] from [facility]. At 0700, the patient was reported to have increased right sided weakness, slurred speech, and right sided facial droop. Admitting Physician came to evaluate the patient around 0800, and reported to call a Code Stroke Staff called a Code Stroke. Stroke Team met patient at her room. Initial NIHSS 5 due to right arm drift, right leg weakness, right facial droop, and slurred speech. Pt was taken down for CT/CTA. CT showed no signs of hemorrhage. CTA showed some blockage with unknown stenosis or occlusion. Pt taken to MRI to decide for treatment. No tPA given due to patient being outside the window. While being prepared for MRI, pt was asked to be brought to IR. Pt was evaluated by Dr. Estanislado Pandy and asked to have MRI completed due to mild symptoms. Pt brought back to MRI and MRI completed. Showed a basilar occlusion. Pt taken to IR and arrived at 0950. Pt placed on the table. Anesthesia and RN given report. Blood drawn for patient and sent down to the lab. Pt to be admitted to [place].

## 2018-08-14 NOTE — Progress Notes (Signed)
Pt seen with Dr. Estanislado Pandy post procedure in Neuro ICU. Has been extubated, currently on Bipap. Family at bedside. BP (!) 147/77   Pulse 98   Temp 97.7 F (36.5 C)   Resp 19   Ht 5' 6.5" (1.689 m)   Wt 124.6 kg   LMP 07/26/2018   SpO2 100%   BMI 43.67 kg/m  Pt attempting to follow some commands. Aphasic but nodding appropriately. Currently unable to move (R)UE or LE. Sensation intact. (R)groin dressing dry.  S/P stent assisted angioplasty of distal basilar artery with patency of 80 to 90 %. Brilinta and ASA as ordered per Dr. Loni Beckwith PA-C Interventional Radiology 08/24/2018 3:37 PM

## 2018-08-14 NOTE — Sedation Documentation (Signed)
Code Stroke called @ [phone]. Patient in Interventional Radiology (IR) department at [phone]. Patient departed IR to get MRI of brain at 0915. Patient arrived back to IR 2 @ 0950.

## 2018-08-14 NOTE — H&P (Signed)
History and Physical    IVAH GIRARDOT LFY:[id number] DOB: 09-28-1961 DOA: 08/14/2018  PCP: Danna Hefty, DO   Patient coming from: Home   Chief Complaint: Chest pain, left arm discomfort   HPI: Andrea [name] is a 57 y.o. female with medical history significant for resistant hypertension, insulin-dependent diabetes mellitus, obesity, iron deficiency anemia, and hyperthyroidism status post ablation, now presenting to the emergency department for evaluation of chest pain and left arm discomfort.  Patient reports that she ran out of her medications approximately 3 days ago, but continued to do well until yesterday when she developed pain in her chest.  She reports a constant pressure sensation in the mid chest that began yesterday, has waxed and waned, associated with mild dyspnea and left arm discomfort, but no diaphoresis or nausea.  She is unable to identify any alleviating or exacerbating factors for this.  She has had similar chest discomfort previously, but not recently.  She denies any fevers, chills, increase in her chronic leg swelling, or leg tenderness.  ED Course: Upon arrival to the ED, patient is found to be afebrile, saturating well on room air, and with blood pressure 220/117.  EKG features a sinus rhythm and LVH.  Chest x-ray is notable for cardiomegaly and mild central vascular congestion.  Chemistry panel features a glucose of 251 and creatinine 1.28, improved from prior.  CBC features a stable microcytic anemia and mild thrombocytosis.  Troponin is normal and BNP slightly elevated.  Patient was given 324 mg of aspirin, nitroglycerin, Norvasc, clonidine, HCTZ, losartan, morphine, and Zofran in the ED.  She reports that the chest discomfort has resolved.  Blood pressure has improved.  She will be observed on the telemetry unit for ongoing evaluation and management of chest pain.  Review of Systems:  All other systems reviewed and apart from HPI, are negative.  Past Medical  History:  Diagnosis Date  . Diabetes mellitus   . Environmental allergies   . Fibroid   . High cholesterol   . Hx of cardiovascular stress test    a. ETT-MV 2/14:  EF 46% (normal by echo), low risk, no ischemia or scar  . Hx of echocardiogram    Echo 2/14: mild LVH, EF 55-60%, Gr 1 diast dysfn, mild LAE  . Hypertension   . Hyperthyroidism    Radioactive iodine ablation    Past Surgical History:  Procedure Laterality Date  . [place], 2001   x 2     reports that she has never smoked. She has never used smokeless tobacco. She reports that she does not drink alcohol or use drugs.  Allergies  Allergen Reactions  . Tramadol     "Makes me sees things and I just dont act right"    Family History  Problem Relation Age of Onset  . Pneumonia Mother   . Diabetes Mother   . Hypertension Father   . Hypertension Sister   . Hyperlipidemia Sister   . Hypertension Brother   . Hypertension Maternal Aunt   . Hypertension Paternal Aunt   . Cancer Paternal Aunt        throat cancer  . Diabetes Maternal Grandmother   . Diabetes Paternal Grandmother   . Cancer Paternal Grandmother        pancreatic cancer     Prior to Admission medications   Medication Sig Start Date End Date Taking? Authorizing Provider  amLODipine (NORVASC) 10 MG tablet Take 1 tablet (10 mg total) by mouth  daily. 05/16/18  Yes Elsie Stain, MD  aspirin EC 81 MG tablet Take 81 mg by mouth daily.   Yes [provider]  atorvastatin (LIPITOR) 20 MG tablet Take 1 tablet (20 mg total) by mouth daily. 01/29/18  Yes Verner Mould, MD  benzonatate (TESSALON) 100 MG capsule Take 1 capsule (100 mg total) by mouth every 8 (eight) hours. 12/16/17  Yes Maczis, Barth Kirks, PA-C  cloNIDine (CATAPRES) 0.2 MG tablet Take 1.5 tablets (0.3 mg total) by mouth 2 (two) times daily. 05/16/18  Yes Elsie Stain, MD  ferrous sulfate 325 (65 FE) MG EC tablet Take 1 tablet (325 mg total) by mouth 3  (three) times daily with meals. 01/17/17  Yes Hensel, Jamal Collin, MD  gabapentin (NEURONTIN) 300 MG capsule Take 1 capsule (300 mg total) by mouth 3 (three) times daily. Patient taking differently: Take 300 mg by mouth 3 (three) times daily as needed (nerve pain).  12/26/16  Yes Lysbeth Penner, FNP  hydrochlorothiazide (HYDRODIURIL) 25 MG tablet TAKE 1 TABLET (25 MG TOTAL) BY MOUTH DAILY. 05/16/18  Yes Elsie Stain, MD  ibuprofen (ADVIL,MOTRIN) 800 MG tablet Take 1 tablet (800 mg total) by mouth every 8 (eight) hours as needed. Patient taking differently: Take 800 mg by mouth every 8 (eight) hours as needed for headache or mild pain.  01/19/17  Yes Lawyer, Harrell Gave, PA-C  Insulin Glargine (LANTUS SOLOSTAR) 100 UNIT/ML Solostar Pen INJECT 27 UNITS INTO THE SKIN AT BEDTIME. 05/16/18  Yes Elsie Stain, MD  losartan (COZAAR) 25 MG tablet Take 1 tablet (25 mg total) by mouth daily. 05/16/18  Yes Elsie Stain, MD  megestrol (MEGACE) 40 MG tablet TAKE 2 TABLETS BY MOUTH TWICE A DAY , MAY INCREASE TO 3 TABLETS TWICE DAILY FOR HEAVY BLEEDING Patient taking differently: Take 80 mg by mouth 2 (two) times daily. May increase to 3 tablets twice daily for heavy bleeding 11/26/17  Yes Anyanwu, Sallyanne Havers, MD  metFORMIN (GLUCOPHAGE) 1000 MG tablet Take 1 tablet (1,000 mg total) by mouth 2 (two) times daily with a meal. 05/16/18  Yes Elsie Stain, MD  metoprolol succinate (TOPROL XL) 50 MG 24 hr tablet Take 1 tablet (50 mg total) by mouth daily. Take with or immediately following a meal. 05/16/18  Yes Elsie Stain, MD  VOLTAREN 1 % GEL APPLY 2 G TOPICALLY 4 (FOUR) TIMES DAILY. Patient taking differently: Apply 2 g topically 4 (four) times daily as needed (pain).  01/16/17  Yes Bacigalupo, Dionne Bucy, MD  Insulin Pen Needle (B-D ULTRAFINE III SHORT PEN) 31G X 8 MM MISC USE AS DIRECTED TO INJECT INSULIN AT BEDTIME 01/15/18   Verner Mould, MD  Insulin Syringe-Needle U-100 (INSULIN SYRINGE  .5CC/30GX1/2") 30G X 1/2" 0.5 ML MISC Use to inject insulin daily as prescribed 01/23/17   Virginia Crews, MD  sertraline (ZOLOFT) 50 MG tablet Take 50 mg by mouth daily.  01/19/12  [provider]    Physical Exam: Vitals:   08/31/2018 0100 09/01/2018 0130 08/29/2018 0221 08/05/2018 0230  BP: (!) 200/112 (!) 165/106 (!) 145/91 (!) 145/98  Pulse:   77   Resp: 20 (!) 22 18 13   Temp:      TempSrc:      SpO2: 99% 97% 100%   Weight:      Height:         Constitutional: NAD, calm  Eyes: PERTLA, lids and conjunctivae normal ENMT: Mucous membranes are moist. Posterior pharynx  clear of any exudate or lesions.   Neck: normal, supple, no masses, no thyromegaly Respiratory: clear to auscultation bilaterally, no wheezing, no crackles. Normal respiratory effort.    Cardiovascular: S1 & S2 heard, regular rate and rhythm. Trace pretibial edema bilaterally. Abdomen: No distension, no tenderness, soft. Bowel sounds normal.  Musculoskeletal: no clubbing / cyanosis. No joint deformity upper and lower extremities.    Skin: no significant rashes, lesions, ulcers. Warm, dry, well-perfused. Neurologic: CN 2-12 grossly intact. Sensation intact. Strength 5/5 in all 4 limbs.  Psychiatric: Alert and oriented x 3. Calm, cooperative.    Labs on Admission: I have personally reviewed following labs and imaging studies  CBC: Recent Labs  Lab 08/15/2018 2208  WBC 8.3  HGB 10.8*  HCT 35.7*  MCV 78.5*  PLT 426*   Basic Metabolic Panel: Recent Labs  Lab 08/26/2018 2208  NA 141  K 3.7  CL 107  CO2 24  GLUCOSE 251*  BUN 17  CREATININE 1.28*  CALCIUM 10.8*   GFR: Estimated Creatinine Clearance: 65.3 mL/min (A) (by C-G formula based on SCr of 1.28 mg/dL (H)). Liver Function Tests: No results for input(s): AST, ALT, ALKPHOS, BILITOT, PROT, ALBUMIN in the last 168 hours. No results for input(s): LIPASE, AMYLASE in the last 168 hours. No results for input(s): AMMONIA in the last 168  hours. Coagulation Profile: No results for input(s): INR, PROTIME in the last 168 hours. Cardiac Enzymes: No results for input(s): CKTOTAL, CKMB, CKMBINDEX, TROPONINI in the last 168 hours. BNP (last 3 results) No results for input(s): PROBNP in the last 8760 hours. HbA1C: No results for input(s): HGBA1C in the last 72 hours. CBG: No results for input(s): GLUCAP in the last 168 hours. Lipid Profile: No results for input(s): CHOL, HDL, LDLCALC, TRIG, CHOLHDL, LDLDIRECT in the last 72 hours. Thyroid Function Tests: Recent Labs    08/07/2018 0008  TSH 1.338   Anemia Panel: No results for input(s): VITAMINB12, FOLATE, FERRITIN, TIBC, IRON, RETICCTPCT in the last 72 hours. Urine analysis:    Component Value Date/Time   COLORURINE AMBER (A) 01/23/2018 1134   APPEARANCEUR CLOUDY (A) 01/23/2018 1134   LABSPEC 1.020 01/23/2018 1134   PHURINE 5.0 01/23/2018 1134   GLUCOSEU >=500 (A) 01/23/2018 1134   HGBUR LARGE (A) 01/23/2018 1134   BILIRUBINUR NEGATIVE 01/23/2018 1134   BILIRUBINUR NEGATIVE 02/10/2014 1023   KETONESUR NEGATIVE 01/23/2018 1134   PROTEINUR 100 (A) 01/23/2018 1134   UROBILINOGEN 1.0 05/02/2015 2230   NITRITE NEGATIVE 01/23/2018 1134   LEUKOCYTESUR LARGE (A) 01/23/2018 1134   Sepsis Labs: @LABRCNTIP (procalcitonin:4,lacticidven:4) )No results found for this or any previous visit (from the past 240 hour(s)).   Radiological Exams on Admission: Dg Chest 2 View  Result Date: 09/03/2018 CLINICAL DATA:  Chest pain starting yesterday. EXAM: CHEST - 2 VIEW COMPARISON:  12/16/2017 FINDINGS: Stable cardiomegaly with atherosclerotic, slightly ectatic thoracic aorta. Central pulmonary vascular congestion is noted without pulmonary consolidation. No effusion or pneumothorax. Thoracic spondylosis is noted. No acute osseous abnormality is seen. IMPRESSION: Cardiomegaly with mild central vascular congestion. Electronically Signed   By: Ashley Royalty M.D.   On: 08/15/2018 22:49    EKG:  Independently reviewed. Sinus rhythm, LVH.   Assessment/Plan   1. Chest pain  - Presents with chest pain and left arm discomfort  - Found to be hypertensive to 220/117 in ED  - Initial troponin negative, no acute ischemic features noted on EKG, and CXR with mild vascular congestion  - Treated in ED with ASA  324 mg, NTG, and morphine  - Pain resolved with ED treatments  - She had reassuring stress test in 2014 - Risk-factors include poorly-controlled HTN and DM  - Continue cardiac monitoring, obtain serial troponin measurements, repeat EKG, continue ASA, statin, and beta-blocker   2. Hypertension with hypertensive crisis  - BP 220/117 in ED; pt has hx of resistant HTN and reports not taking her medications for the past 3 days  - BP improved with her home meds and NTG in ED  - Patient reports chest pain and left arm discomfort but no evidence for ischemia on EKG or troponin  - Check head CT and UA for signs of end-organ damage   - Continue Norvasc, clonidine, losartan, HCTZ, Toprol   3. Insulin-dependent DM  - A1c was 12.8% last year  - Managed at home with Lantus 27 units qHS and metformin  - Check CBG's, continue Lantus with sliding-scale correctional insulin while in hospital   4. CKD stage III  - SCr is 1.28 on admission, similar to priors  - Renally-dose medications    5. Microcytic anemia  - Hgb is stable at 10.8 on admission  - No active bleeding  - She will continue her iron supplement    DVT prophylaxis: Lovenox Code Status: Full  Family Communication: Family updated at bedside Consults called: None Admission status: Observation     Vianne Bulls, MD [facility] Pager [phone]  If 7PM-7AM, please contact night-coverage [email or web address] Password [email or web address]  08/28/2018, 2:52 AM

## 2018-08-14 NOTE — Anesthesia Procedure Notes (Signed)
Procedure Name: Intubation Date/Time: 09/02/2018 10:04 AM Performed by: Inda Coke, CRNA Pre-anesthesia Checklist: Patient identified, Emergency Drugs available, Suction available and Patient being monitored Patient Re-evaluated:Patient Re-evaluated prior to induction Oxygen Delivery Method: Circle System Utilized Preoxygenation: Pre-oxygenation with 100% oxygen Induction Type: IV induction Ventilation: Mask ventilation without difficulty and Oral airway inserted - appropriate to patient size Laryngoscope Size: Mac and 3 Grade View: Grade I Tube type: Oral Number of attempts: 1 Airway Equipment and Method: Stylet and Oral airway Placement Confirmation: ETT inserted through vocal cords under direct vision,  positive ETCO2 and breath sounds checked- equal and bilateral Secured at: 22 cm Tube secured with: Tape Dental Injury: Teeth and Oropharynx as per pre-operative assessment

## 2018-08-15 ENCOUNTER — Inpatient Hospital Stay (HOSPITAL_COMMUNITY): Payer: Medicaid Other

## 2018-08-15 ENCOUNTER — Encounter (HOSPITAL_COMMUNITY): Payer: Self-pay | Admitting: Interventional Radiology

## 2018-08-15 DIAGNOSIS — Z4659 Encounter for fitting and adjustment of other gastrointestinal appliance and device: Secondary | ICD-10-CM

## 2018-08-15 DIAGNOSIS — Z01818 Encounter for other preprocedural examination: Secondary | ICD-10-CM

## 2018-08-15 DIAGNOSIS — I5030 Unspecified diastolic (congestive) heart failure: Secondary | ICD-10-CM

## 2018-08-15 DIAGNOSIS — E785 Hyperlipidemia, unspecified: Secondary | ICD-10-CM

## 2018-08-15 LAB — BASIC METABOLIC PANEL
ANION GAP: 9 (ref 5–15)
Anion gap: 11 (ref 5–15)
BUN: 13 mg/dL (ref 6–20)
BUN: 12 mg/dL (ref 6–20)
CALCIUM: 9.5 mg/dL (ref 8.9–10.3)
CALCIUM: 9.7 mg/dL (ref 8.9–10.3)
CHLORIDE: 108 mmol/L (ref 98–111)
CO2: 18 mmol/L — ABNORMAL LOW (ref 22–32)
CO2: 17 mmol/L — ABNORMAL LOW (ref 22–32)
CREATININE: 1.13 mg/dL — AB (ref 0.44–1.00)
Chloride: 111 mmol/L (ref 98–111)
Creatinine, Ser: 1.04 mg/dL — ABNORMAL HIGH (ref 0.44–1.00)
GFR calc Af Amer: >60 mL/min (ref >60)
GFR calc non Af Amer: 53 mL/min — ABNORMAL LOW (ref >60)
GFR calc non Af Amer: 58 mL/min — ABNORMAL LOW (ref >60)
GLUCOSE: 199 mg/dL — AB (ref 70–99)
Glucose, Bld: 204 mg/dL — ABNORMAL HIGH (ref 70–99)
Potassium: 3.1 mmol/L — ABNORMAL LOW (ref 3.5–5.1)
Potassium: 3.4 mmol/L — ABNORMAL LOW (ref 3.5–5.1)
SODIUM: 137 mmol/L (ref 135–145)
Sodium: 137 mmol/L (ref 135–145)

## 2018-08-15 LAB — GLUCOSE, CAPILLARY
GLUCOSE-CAPILLARY: 179 mg/dL — AB (ref 70–99)
Glucose-Capillary: 216 mg/dL — ABNORMAL HIGH (ref 70–99)
Glucose-Capillary: 154 mg/dL — ABNORMAL HIGH (ref 70–99)
Glucose-Capillary: 165 mg/dL — ABNORMAL HIGH (ref 70–99)
Glucose-Capillary: 168 mg/dL — ABNORMAL HIGH (ref 70–99)
Glucose-Capillary: 195 mg/dL — ABNORMAL HIGH (ref 70–99)

## 2018-08-15 LAB — CBC WITH DIFFERENTIAL/PLATELET
Abs Immature Granulocytes: 0.05 10*3/uL (ref 0.00–0.07)
BASOS ABS: 0 10*3/uL (ref 0.0–0.1)
BASOS PCT: 0 %
EOS ABS: 0 10*3/uL (ref 0.0–0.5)
Eosinophils Relative: 0 %
HCT: 32.4 % — ABNORMAL LOW (ref 36.0–46.0)
Hemoglobin: 9.7 g/dL — ABNORMAL LOW (ref 12.0–15.0)
Immature Granulocytes: 1 %
LYMPHS PCT: 11 %
Lymphs Abs: 1.2 10*3/uL (ref 0.7–4.0)
MCH: 23.1 pg — AB (ref 26.0–34.0)
MCHC: 29.9 g/dL — ABNORMAL LOW (ref 30.0–36.0)
MCV: 77.1 fL — ABNORMAL LOW (ref 80.0–100.0)
MONO ABS: 0.7 10*3/uL (ref 0.1–1.0)
Monocytes Relative: 6 %
NRBC: 0 % (ref 0.0–0.2)
Neutro Abs: 9.1 10*3/uL — ABNORMAL HIGH (ref 1.7–7.7)
Neutrophils Relative %: 82 %
PLATELETS: 409 10*3/uL — AB (ref 150–400)
RBC: 4.2 MIL/uL (ref 3.87–5.11)
RDW: 15.4 % (ref 11.5–15.5)
WBC: 11.1 10*3/uL — AB (ref 4.0–10.5)

## 2018-08-15 LAB — ECHOCARDIOGRAM COMPLETE
HEIGHTINCHES: 64 in
Weight: 4345.71 oz

## 2018-08-15 LAB — PLATELET INHIBITION P2Y12: PLATELET FUNCTION P2Y12: 35 [PRU] — AB (ref 194–418)

## 2018-08-15 LAB — MAGNESIUM: Magnesium: 1.7 mg/dL (ref 1.7–2.4)

## 2018-08-15 LAB — HEMOGLOBIN A1C
HEMOGLOBIN A1C: 11.4 % — AB (ref 4.8–5.6)
MEAN PLASMA GLUCOSE: 280.48 mg/dL

## 2018-08-15 LAB — PHOSPHORUS: Phosphorus: 3.6 mg/dL (ref 2.5–4.6)

## 2018-08-15 LAB — LIPID PANEL
CHOLESTEROL: 256 mg/dL — AB (ref 0–200)
HDL: 35 mg/dL — AB (ref >40)
LDL CALC: 171 mg/dL — AB (ref 0–99)
TRIGLYCERIDES: 248 mg/dL — AB (ref <150)
Total CHOL/HDL Ratio: 7.3 RATIO
VLDL: 50 mg/dL — ABNORMAL HIGH (ref 0–40)

## 2018-08-15 LAB — TRIGLYCERIDES: Triglycerides: 501 mg/dL — ABNORMAL HIGH (ref <150)

## 2018-08-15 MED ORDER — VITAL HIGH PROTEIN PO LIQD
1000.0000 mL | ORAL | Status: DC
  Administered 2018-08-15: 1000 mL

## 2018-08-15 MED ORDER — PRO-STAT SUGAR FREE PO LIQD: 30.0000 mL | Freq: Two times a day (BID) | ORAL | Status: DC

## 2018-08-15 MED ORDER — FENTANYL BOLUS VIA INFUSION
25.0000 ug | INTRAVENOUS | Status: DC | PRN
  Filled 2018-08-15: qty 100

## 2018-08-15 MED ORDER — VITAL HIGH PROTEIN PO LIQD: 1000.0000 mL | ORAL | Status: DC

## 2018-08-15 MED ORDER — HEPARIN SODIUM (PORCINE) 5000 UNIT/ML IJ SOLN
5000.0000 [IU] | Freq: Three times a day (TID) | INTRAMUSCULAR | Status: DC
  Administered 2018-08-15 – 2018-08-19 (×12): 5000 [IU] via SUBCUTANEOUS
  Filled 2018-08-15 (×12): qty 1

## 2018-08-15 MED ORDER — PRO-STAT SUGAR FREE PO LIQD
60.0000 mL | Freq: Four times a day (QID) | ORAL | Status: DC
  Administered 2018-08-15 – 2018-08-18 (×11): 60 mL
  Filled 2018-08-15 (×11): qty 60

## 2018-08-15 MED ORDER — IOPAMIDOL (ISOVUE-370) INJECTION 76%
50.0000 mL | Freq: Once | INTRAVENOUS | Status: AC | PRN
  Administered 2018-08-15: 50 mL via INTRAVENOUS

## 2018-08-15 MED ORDER — POTASSIUM CHLORIDE 20 MEQ PO PACK
40.0000 meq | PACK | Freq: Two times a day (BID) | ORAL | Status: AC
  Administered 2018-08-15: 40 meq
  Filled 2018-08-15 (×2): qty 2

## 2018-08-15 MED ORDER — FENTANYL 2500MCG IN NS 250ML (10MCG/ML) PREMIX INFUSION
0.0000 ug/h | INTRAVENOUS | Status: DC
  Administered 2018-08-15: 50 ug/h via INTRAVENOUS
  Administered 2018-08-16: 100 ug/h via INTRAVENOUS
  Administered 2018-08-18: 50 ug/h via INTRAVENOUS
  Filled 2018-08-15 (×2): qty 250

## 2018-08-15 MED ORDER — METOPROLOL TARTRATE 25 MG/10 ML ORAL SUSPENSION
50.0000 mg | Freq: Two times a day (BID) | ORAL | Status: DC
  Administered 2018-08-15 – 2018-08-19 (×9): 50 mg
  Filled 2018-08-15 (×10): qty 20

## 2018-08-15 MED ORDER — ATORVASTATIN CALCIUM 80 MG PO TABS
80.0000 mg | ORAL_TABLET | Freq: Every day | ORAL | Status: DC
  Administered 2018-08-15 – 2018-08-24 (×10): 80 mg via ORAL
  Filled 2018-08-15 (×11): qty 1

## 2018-08-15 MED ORDER — CLEVIDIPINE BUTYRATE 0.5 MG/ML IV EMUL
0.0000 mg/h | INTRAVENOUS | Status: DC
  Administered 2018-08-15: 21 mg/h via INTRAVENOUS
  Administered 2018-08-15: 15 mg/h via INTRAVENOUS
  Administered 2018-08-15: 21 mg/h via INTRAVENOUS
  Administered 2018-08-15: 15 mg/h via INTRAVENOUS
  Administered 2018-08-15: 21 mg/h via INTRAVENOUS
  Administered 2018-08-16: 17 mg/h via INTRAVENOUS
  Administered 2018-08-16 (×3): 21 mg/h via INTRAVENOUS
  Filled 2018-08-15 (×8): qty 50

## 2018-08-15 MED ORDER — AMLODIPINE BESYLATE 10 MG PO TABS
10.0000 mg | ORAL_TABLET | Freq: Every day | ORAL | Status: DC
  Administered 2018-08-16 – 2018-09-20 (×35): 10 mg
  Filled 2018-08-15 (×30): qty 1
  Filled 2018-08-15: qty 2
  Filled 2018-08-15 (×4): qty 1

## 2018-08-15 MED ORDER — LOSARTAN POTASSIUM 50 MG PO TABS
100.0000 mg | ORAL_TABLET | Freq: Every day | ORAL | Status: DC
  Administered 2018-08-15 – 2018-08-20 (×6): 100 mg
  Filled 2018-08-15 (×6): qty 2

## 2018-08-15 MED ORDER — ADULT MULTIVITAMIN LIQUID CH
15.0000 mL | Freq: Every day | ORAL | Status: DC
  Administered 2018-08-15 – 2018-11-13 (×90): 15 mL
  Filled 2018-08-15 (×92): qty 15

## 2018-08-15 MED ORDER — AMLODIPINE BESYLATE 5 MG PO TABS
5.0000 mg | ORAL_TABLET | Freq: Every day | ORAL | Status: DC
  Administered 2018-08-15: 5 mg
  Filled 2018-08-15: qty 1

## 2018-08-15 MED ORDER — POTASSIUM CHLORIDE 20 MEQ/15ML (10%) PO SOLN
20.0000 meq | Freq: Three times a day (TID) | ORAL | Status: DC
  Administered 2018-08-15: 20 meq
  Filled 2018-08-15: qty 15

## 2018-08-15 NOTE — Progress Notes (Addendum)
Per Dr. Jimmy Footman w/ [facility] and after review of the x-ray, OG tube is okay to be used for medications.

## 2018-08-15 NOTE — Progress Notes (Signed)
PT Cancellation Note  Patient Details Name: Andrea [name] MRN: [id number] DOB: 06/02/1961   Cancelled Treatment:    Reason Eval/Treat Not Completed: Medical issues which prohibited therapy.  Please page PT at the number listed below if pt stabilizes today and would benefit from our assessment.  Thanks,   Barbarann Ehlers. Samira Acero, PT, DPT  Acute Rehabilitation [phone] pager [phone]) [phone] office     Wells Guiles B Keshon Markovitz 08/15/2018, 9:41 AM

## 2018-08-15 NOTE — Progress Notes (Signed)
Progress Note  Patient Name: Andrea [name] Date of Encounter: 08/15/2018  Primary Cardiologist: Dr. Gwen Pounds   Subjective   Pt currently intubated and sedated however will track and nod appropriately to questioning.   Inpatient Medications    Scheduled Meds: .  stroke: mapping our early stages of recovery book   Does not apply Once  . amLODipine  5 mg Per Tube Daily  . aspirin  81 mg Oral Daily   Or  . aspirin  81 mg Per Tube Daily  . atorvastatin  80 mg Oral q1800  . chlorhexidine gluconate (MEDLINE KIT)  15 mL Mouth Rinse BID  . heparin injection (subcutaneous)  5,000 Units Subcutaneous Q8H  . insulin aspart  0-20 Units Subcutaneous Q4H  . losartan  100 mg Per Tube Daily  . mouth rinse  15 mL Mouth Rinse 10 times per day  . metoprolol tartrate  50 mg Per Tube BID  . potassium chloride  40 mEq Per Tube BID  . ticagrelor  90 mg Oral BID   Or  . ticagrelor  90 mg Per Tube BID   Continuous Infusions: . sodium chloride 75 mL/hr at 08/15/18 1258  . clevidipine 21 mg/hr (08/15/18 1337)  . famotidine (PEPCID) IV 20 mg (08/15/18 1054)  . fentaNYL infusion INTRAVENOUS 50 mcg/hr (08/15/18 1259)   PRN Meds: [DISCONTINUED] acetaminophen **OR** [DISCONTINUED] acetaminophen (TYLENOL) oral liquid 160 mg/5 mL **OR** acetaminophen, albuterol, fentaNYL, hydrALAZINE, labetalol, ondansetron (ZOFRAN) IV, ticagrelor   Vital Signs    Vitals:   08/15/18 0939 08/15/18 0943 08/15/18 1100 08/15/18 1232  BP: (!) 151/74 135/73 (!) 144/71 (!) 141/68  Pulse: 75  66 76  Resp:   (!) 23 (!) 28  Temp:    98.3 F (36.8 C)  TempSrc:    Axillary  SpO2: 100%  99% 99%  Weight:      Height:        Intake/Output Summary (Last 24 hours) at 08/15/2018 1341 Last data filed at 08/15/2018 1100 Gross per 24 hour  Intake 2116.01 ml  Output 2500 ml  Net -383.99 ml   Filed Weights   08/08/2018 0452 08/05/2018 1430 08/15/18 0515  Weight: 124.6 kg 128.3 kg 123.2 kg    Physical  Exam   General: Intubated, NAD Skin: Warm, dry, intact  Head: Normocephalic, atraumatic, clear, moist mucus membranes. Neck: Negative for carotid bruits. No JVD Lungs: Rhonchus throughout all lung fields. ETT in place  Cardiovascular: RRR with S1 S2. No murmurs, rubs, gallops, or LV heave appreciated. Abdomen: Soft, non-tender, non-distended with normoactive bowel sounds.  No obvious abdominal masses. MSK: Unable to move bilateral upper extremities. Moves left lower extremity, not right  Extremities: Mild 1+ BLE edema. No clubbing or cyanosis. DP/PT pulses 1+ bilaterally Neuro: Intubated and with mild sedation.  Psych: Responds to questions appropriately with ETT in place   Labs    Chemistry Recent Labs  Lab 08/08/2018 0317 08/27/2018 1028 08/15/18 0512  NA 140 136 137  K 3.4* 3.4* 3.1*  CL 106 105 108  CO2 22 21* 18*  GLUCOSE 252* 255* 204*  BUN 17 12 13   CREATININE 1.15* 1.03* 1.13*  CALCIUM 10.3 10.6* 9.5  GFRNONAA 52* 59* 53*  GFRAA >60 >60 >60  ANIONGAP 12 10 11      Hematology Recent Labs  Lab 08/06/2018 2208 08/26/2018 1028 08/15/18 0512  WBC 8.3 9.4 11.1*  RBC 4.55 4.68 4.20  HGB 10.8* 11.1* 9.7*  HCT 35.7* 36.0 32.4*  MCV 78.5* 76.9* 77.1*  MCH 23.7* 23.7* 23.1*  MCHC 30.3 30.8 29.9*  RDW 15.3 15.0 15.4  PLT 409* 418* 409*   Cardiac Enzymes Recent Labs  Lab 08/18/2018 0317 08/16/2018 2015  TROPONINI 0.03* <0.03    Recent Labs  Lab 08/17/2018 2220  TROPIPOC 0.02    BNP Recent Labs  Lab 08/24/2018 0008  BNP 116.0*     Radiology    Ct Angio Head W Or Wo Contrast  Result Date: 08/15/2018 CLINICAL DATA:  Stroke follow-up EXAM: CT ANGIOGRAPHY HEAD AND NECK TECHNIQUE: Multidetector CT imaging of the head and neck was performed using the standard protocol during bolus administration of intravenous contrast. Multiplanar CT image reconstructions and MIPs were obtained to evaluate the vascular anatomy. Carotid stenosis measurements (when applicable) are obtained  utilizing NASCET criteria, using the distal internal carotid diameter as the denominator. CONTRAST:  51m ISOVUE-370 IOPAMIDOL (ISOVUE-370) INJECTION 76% COMPARISON:  CTA of the head neck from yesterday FINDINGS: CT HEAD FINDINGS Brain: Known infarct in the lower pons. There is patchy right more than left superior cerebellar infarctions not seen on prior brain MRI. Chronic small vessel ischemic change in the cerebral white matter. No hemorrhage, hydrocephalus, or mass effect. Vascular: See below. Skull: No acute finding Sinuses: Negative Orbits: Negative Review of the MIP images confirms the above findings CTA NECK FINDINGS Aortic arch: Partial coverage is negative.  Three vessel branching. Right carotid system: Mild atherosclerotic calcification. No stenosis or ulceration. Left carotid system: Mild atherosclerotic calcification. No stenosis or ulceration. Vertebral arteries: No proximal subclavian stenosis. Left dominant vertebral artery. Both vertebral arteries are widely patent to the dura when allowing for artifact related to bolus density and patient size. Skeleton: No acute or aggressive finding. Other neck: Tracheal and esophageal intubation.  Thyromegaly. Upper chest: Negative Review of the MIP images confirms the above findings CTA HEAD FINDINGS Anterior circulation: Atheromatous irregularity of the left MCA with mild proximal M1 and more moderate bifurcation stenosis. Due to technical limitations there is limited evaluation of branches, with no major branch occlusion seen. Posterior circulation: Left vertebral artery dominance. Most of right vertebral flow is into the right PICA. There is been distal basilar to left proximal PCA stenting. The intrinsic density of the stent limits assessment of in stent patency. There is likely a degree of waisting best seen on precontrast imaging, seen at the level of the mid basilar segment. The stent is patent based on flow seen in the proximal left PCA. There is a  unchanged advanced left P2 segment stenosis. Posterior communicating arteries, larger on the right. Venous sinuses: Patent Anatomic variants: None significant Delayed phase: No abnormal intracranial enhancement. These results were called by telephone at the time of interpretation on 08/15/2018 at 9:38 am to Dr. XErlinda Hong who verbally acknowledged these results. Review of the MIP images confirms the above findings IMPRESSION: 1. Interval basilar to left proximal PCA stenting. The density of the stent walls precludes detection of in stent stenosis; there is a degree of wasting at the mid basilar segment. There is flow in the left PCA beyond the stent such that there is presumed stent patency. 2. Known lower pontine infarct. There are new small cerebellar infarcts since brain MRI yesterday. 3. Severe left P2 segment stenosis, also seen previously. 4. Moderate atheromatous narrowing in the proximal left MCA. Electronically Signed   By: JMonte FantasiaM.D.   On: 08/15/2018 09:41   Ct Angio Head W Or Wo Contrast  Result Date: 08/18/2018 CLINICAL DATA:  Code stroke EXAM: CT ANGIOGRAPHY HEAD AND NECK TECHNIQUE: Multidetector CT imaging of the head and neck was performed using the standard protocol during bolus administration of intravenous contrast. Multiplanar CT image reconstructions and MIPs were obtained to evaluate the vascular anatomy. Carotid stenosis measurements (when applicable) are obtained utilizing NASCET criteria, using the distal internal carotid diameter as the denominator. CONTRAST:  37m ISOVUE-370 IOPAMIDOL (ISOVUE-370) INJECTION 76% COMPARISON:  CT head 08/30/2018 FINDINGS: CTA NECK FINDINGS Aortic arch: Standard branching. Imaged portion shows no evidence of aneurysm or dissection. No significant stenosis of the major arch vessel origins. Right carotid system: Right carotid widely patent without stenosis. Minimal atherosclerotic disease right carotid bifurcation with small calcification carotid bulb  Left carotid system: Left carotid widely patent without stenosis. Minimal atherosclerotic calcification left carotid bulb. Vertebral arteries: Left vertebral dominant. Left vertebral artery patent to the basilar without stenosis. Right vertebral is small and ends in PICA most likely a congenital variant. Skeleton: Cervical spondylosis without acute skeletal abnormality. Other neck: 3 cm right thyroid mass with heterogeneous enhancement. No adenopathy in the neck. Upper chest: Lung apices clear bilaterally. Review of the MIP images confirms the above findings CTA HEAD FINDINGS Anterior circulation: Cavernous carotid widely patent bilaterally without significant stenosis or atherosclerotic disease. Anterior and middle cerebral arteries patent bilaterally. Mild atherosclerotic irregularity left M1 segment. Mild to moderate stenosis left MCA bifurcation. Negative for aneurysm. Posterior circulation: Right vertebral artery ends in PICA. Left vertebral artery supplies the basilar. PICA patent bilaterally. Irregular basilar due to diffuse atherosclerotic disease. Severe focal stenosis in the mid to distal basilar. Superior cerebellar and posterior cerebral arteries patent bilaterally. Atherosclerotic irregularity in the posterior cerebral artery with moderate stenosis right posterior cerebral artery and severe focal stenosis left posterior cerebral artery. Venous sinuses: Patent Anatomic variants: None Delayed phase: Not performed Review of the MIP images confirms the above findings IMPRESSION: 1. 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 No definite acute thrombus identified. 2. Severe stenosis left posterior cerebral artery and moderate stenosis right posterior cerebral artery. 3. Mild stenosis left MCA and moderate stenosis left MCA bifurcation. 4. No significant carotid or vertebral artery stenosis in the neck. 5. These results were called by telephone  at the time of interpretation on 08/21/2018 at 08:55 Am to Dr. ARory Percy, who verbally acknowledged these results. Electronically Signed   By: CFranchot GalloM.D.   On: 08/27/2018 09:31   Dg Chest 2 View  Result Date: 09/04/2018 CLINICAL DATA:  Chest pain starting yesterday. EXAM: CHEST - 2 VIEW COMPARISON:  12/16/2017 FINDINGS: Stable cardiomegaly with atherosclerotic, slightly ectatic thoracic aorta. Central pulmonary vascular congestion is noted without pulmonary consolidation. No effusion or pneumothorax. Thoracic spondylosis is noted. No acute osseous abnormality is seen. IMPRESSION: Cardiomegaly with mild central vascular congestion. Electronically Signed   By: DAshley RoyaltyM.D.   On: 08/07/2018 22:49   Ct Head Wo Contrast  Result Date: 08/16/2018 CLINICAL DATA:  Funny feeling in the left arm. Slurred speech. History of hypertension and diabetes. EXAM: CT HEAD WITHOUT CONTRAST TECHNIQUE: Contiguous axial images were obtained from the base of the skull through the vertex without intravenous contrast. COMPARISON:  None. FINDINGS: Brain: No evidence of acute infarction, hemorrhage, hydrocephalus, extra-axial collection or mass lesion/mass effect. Patchy white matter low attenuation changes likely represent early small vessel ischemic change. Vascular: No hyperdense vessel or unexpected calcification. Skull: Normal. Negative for fracture or focal lesion. Sinuses/Orbits: Paranasal sinuses and mastoid air  cells are clear. Other: None. IMPRESSION: No acute intracranial abnormalities. Mild white matter changes likely due to small vessel ischemia. Electronically Signed   By: Lucienne Capers M.D.   On: 08/20/2018 03:33   Ct Angio Neck W Or Wo Contrast  Result Date: 08/15/2018 CLINICAL DATA:  Stroke follow-up EXAM: CT ANGIOGRAPHY HEAD AND NECK TECHNIQUE: Multidetector CT imaging of the head and neck was performed using the standard protocol during bolus administration of intravenous contrast. Multiplanar CT  image reconstructions and MIPs were obtained to evaluate the vascular anatomy. Carotid stenosis measurements (when applicable) are obtained utilizing NASCET criteria, using the distal internal carotid diameter as the denominator. CONTRAST:  51m ISOVUE-370 IOPAMIDOL (ISOVUE-370) INJECTION 76% COMPARISON:  CTA of the head neck from yesterday FINDINGS: CT HEAD FINDINGS Brain: Known infarct in the lower pons. There is patchy right more than left superior cerebellar infarctions not seen on prior brain MRI. Chronic small vessel ischemic change in the cerebral white matter. No hemorrhage, hydrocephalus, or mass effect. Vascular: See below. Skull: No acute finding Sinuses: Negative Orbits: Negative Review of the MIP images confirms the above findings CTA NECK FINDINGS Aortic arch: Partial coverage is negative.  Three vessel branching. Right carotid system: Mild atherosclerotic calcification. No stenosis or ulceration. Left carotid system: Mild atherosclerotic calcification. No stenosis or ulceration. Vertebral arteries: No proximal subclavian stenosis. Left dominant vertebral artery. Both vertebral arteries are widely patent to the dura when allowing for artifact related to bolus density and patient size. Skeleton: No acute or aggressive finding. Other neck: Tracheal and esophageal intubation.  Thyromegaly. Upper chest: Negative Review of the MIP images confirms the above findings CTA HEAD FINDINGS Anterior circulation: Atheromatous irregularity of the left MCA with mild proximal M1 and more moderate bifurcation stenosis. Due to technical limitations there is limited evaluation of branches, with no major branch occlusion seen. Posterior circulation: Left vertebral artery dominance. Most of right vertebral flow is into the right PICA. There is been distal basilar to left proximal PCA stenting. The intrinsic density of the stent limits assessment of in stent patency. There is likely a degree of waisting best seen on  precontrast imaging, seen at the level of the mid basilar segment. The stent is patent based on flow seen in the proximal left PCA. There is a unchanged advanced left P2 segment stenosis. Posterior communicating arteries, larger on the right. Venous sinuses: Patent Anatomic variants: None significant Delayed phase: No abnormal intracranial enhancement. These results were called by telephone at the time of interpretation on 08/15/2018 at 9:38 am to Dr. XErlinda Hong who verbally acknowledged these results. Review of the MIP images confirms the above findings IMPRESSION: 1. Interval basilar to left proximal PCA stenting. The density of the stent walls precludes detection of in stent stenosis; there is a degree of wasting at the mid basilar segment. There is flow in the left PCA beyond the stent such that there is presumed stent patency. 2. Known lower pontine infarct. There are new small cerebellar infarcts since brain MRI yesterday. 3. Severe left P2 segment stenosis, also seen previously. 4. Moderate atheromatous narrowing in the proximal left MCA. Electronically Signed   By: JMonte FantasiaM.D.   On: 08/15/2018 09:41   Ct Angio Neck W Or Wo Contrast  Result Date: 08/28/2018 CLINICAL DATA:  Code stroke EXAM: CT ANGIOGRAPHY HEAD AND NECK TECHNIQUE: Multidetector CT imaging of the head and neck was performed using the standard protocol during bolus administration of intravenous contrast. Multiplanar CT image reconstructions and MIPs were  obtained to evaluate the vascular anatomy. Carotid stenosis measurements (when applicable) are obtained utilizing NASCET criteria, using the distal internal carotid diameter as the denominator. CONTRAST:  23m ISOVUE-370 IOPAMIDOL (ISOVUE-370) INJECTION 76% COMPARISON:  CT head 08/24/2018 FINDINGS: CTA NECK FINDINGS Aortic arch: Standard branching. Imaged portion shows no evidence of aneurysm or dissection. No significant stenosis of the major arch vessel origins. Right carotid system:  Right carotid widely patent without stenosis. Minimal atherosclerotic disease right carotid bifurcation with small calcification carotid bulb Left carotid system: Left carotid widely patent without stenosis. Minimal atherosclerotic calcification left carotid bulb. Vertebral arteries: Left vertebral dominant. Left vertebral artery patent to the basilar without stenosis. Right vertebral is small and ends in PICA most likely a congenital variant. Skeleton: Cervical spondylosis without acute skeletal abnormality. Other neck: 3 cm right thyroid mass with heterogeneous enhancement. No adenopathy in the neck. Upper chest: Lung apices clear bilaterally. Review of the MIP images confirms the above findings CTA HEAD FINDINGS Anterior circulation: Cavernous carotid widely patent bilaterally without significant stenosis or atherosclerotic disease. Anterior and middle cerebral arteries patent bilaterally. Mild atherosclerotic irregularity left M1 segment. Mild to moderate stenosis left MCA bifurcation. Negative for aneurysm. Posterior circulation: Right vertebral artery ends in PICA. Left vertebral artery supplies the basilar. PICA patent bilaterally. Irregular basilar due to diffuse atherosclerotic disease. Severe focal stenosis in the mid to distal basilar. Superior cerebellar and posterior cerebral arteries patent bilaterally. Atherosclerotic irregularity in the posterior cerebral artery with moderate stenosis right posterior cerebral artery and severe focal stenosis left posterior cerebral artery. Venous sinuses: Patent Anatomic variants: None Delayed phase: Not performed Review of the MIP images confirms the above findings IMPRESSION: 1. 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 No definite acute thrombus identified. 2. Severe stenosis left posterior cerebral artery and moderate stenosis right posterior cerebral artery. 3. Mild stenosis left MCA and moderate  stenosis left MCA bifurcation. 4. No significant carotid or vertebral artery stenosis in the neck. 5. These results were called by telephone at the time of interpretation on 08/23/2018 at 08:55 Am to Dr. ARory Percy, who verbally acknowledged these results. Electronically Signed   By: CFranchot GalloM.D.   On: 08/24/2018 09:31   Mr Brain Wo Contrast  Addendum Date: 08/15/2018   ADDENDUM REPORT: 08/15/2018 10:34 ADDENDUM: Omitted impression of left facial/submandibular edema, please correlate with neck exam. Electronically Signed   By: JMonte FantasiaM.D.   On: 08/15/2018 10:34   Result Date: 08/15/2018 CLINICAL DATA:  New onset dysarthria with right-sided weakness and facial droop. EXAM: MRI HEAD WITHOUT CONTRAST TECHNIQUE: Multiplanar, multiecho pulse sequences of the brain and surrounding structures were obtained without intravenous contrast. COMPARISON:  Yesterday FINDINGS: Brain: Larger bilateral pontine acute infarct, approximately doubled in size. New patchy bilateral cerebellar infarction, greater on the right. Tiny acute infarcts in the left thalamus. Negative for hemorrhage. No hydrocephalus or collection. Chronic small vessel ischemia in the cerebral white matter. Vascular: Major flow voids are preserved, including the basilar which has been stented. Skull and upper cervical spine: Negative for marrow lesion. Sinuses/Orbits: Nasopharyngeal and nasal cavity fluid in the setting of intubation. Nonspecific soft tissue swelling in the left face. IMPRESSION: 1. Progressive acute pontine infarct. New patchy bilateral cerebellar infarction. New tiny left thalamic infarcts. 2. Preserved flow void in the stented basilar. Electronically Signed: By: JMonte FantasiaM.D. On: 08/15/2018 10:26   Mr Brain Wo Contrast  Result Date: 08/10/2018 CLINICAL DATA:  Acute  onset of slurred speech and right-sided weakness. Vertebrobasilar disease. Abnormal CTA. EXAM: MRI HEAD WITHOUT CONTRAST TECHNIQUE: Multiplanar,  multiecho pulse sequences of the brain and surrounding structures were obtained without intravenous contrast. COMPARISON:  None. FINDINGS: Brain: An area acute infarction is present in the left paramidline pons. This does appear to cross the midline, somewhat atypical. The area measures 16 x 11 x 5 mm. Scattered periventricular and subcortical T2 changes bilaterally are moderately advanced for age. Brainstem and cerebellum are otherwise within normal limits. The internal auditory canals are normal. Vascular: Flow is present in the major intracranial arteries. Basilar disease is better visualized on the CTA. Skull and upper cervical spine: Skull base is within normal limits. Soft tissue prominence is noted at the dens. There is partial erosion of the dens. Question rheumatoid arthritis. Sinuses/Orbits: The paranasal sinuses and mastoid air cells are clear. Globes and orbits are within normal limits. IMPRESSION: 1. Acute nonhemorrhagic infarct involving the left paramedian brainstem. The infarct crosses midline. 2. Other periventricular and subcortical white matter disease is moderately advanced for age. This likely reflects the sequela of chronic microvascular ischemia. 3. Tapering of the dens with prominent soft tissue pannus. 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 Electronically Signed   By: San Morelle M.D.   On: 08/21/2018 11:52   Portable Chest Xray  Result Date: 08/15/2018 CLINICAL DATA:  Follow-up endotracheal tube EXAM: PORTABLE CHEST 1 VIEW COMPARISON:  08/19/2018 FINDINGS: Endotracheal tube and nasogastric catheter are noted in satisfactory position. Cardiac shadow remains enlarged. The lungs are well aerated bilaterally. Mild bibasilar atelectasis is noted. No sizable effusion is seen. No bony abnormality is noted. IMPRESSION: Tubes and lines as described above. Mild bibasilar atelectasis. Electronically Signed   By: Inez Catalina M.D.   On: 08/15/2018 10:10   Portable Chest  X-ray  Result Date: 08/22/2018 CLINICAL DATA:  Encounter for intubation EXAM: PORTABLE CHEST 1 VIEW COMPARISON:  08/20/2018 FINDINGS: Endotracheal tube has been placed, tip approximately 3.4 centimeters above the carina. The heart is enlarged. There is mild pulmonary vascular congestion. Question of developing mild perihilar edema versus perihilar infiltrate. The stomach is distended by air. IMPRESSION: Interval placement of endotracheal tube. Question developing perihilar edema or infectious process. Gaseous distension of the stomach. Electronically Signed   By: Nolon Nations M.D.   On: 08/30/2018 22:00   Dg Chest Port 1 View  Result Date: 08/21/2018 CLINICAL DATA:  Respiratory insufficiency, history of hypertension and diabetes EXAM: PORTABLE CHEST 1 VIEW COMPARISON:  Chest x-ray of 08/13/2016 FINDINGS: The pulmonary vascular congestion appears to have resolved. No pneumonia or effusion is seen. Mediastinal and hilar contours are unchanged and cardiomegaly is stable. No bony abnormality is seen. IMPRESSION: 1. Resolution of previously noted pulmonary vascular congestion. 2. Stable cardiomegaly. Electronically Signed   By: Ivar Drape M.D.   On: 08/29/2018 17:26   Dg Abd Portable 1v  Result Date: 08/21/2018 CLINICAL DATA:  OG tube placement EXAM: PORTABLE ABDOMEN - 1 VIEW COMPARISON:  CT abdomen and pelvis 01/11/2017 FINDINGS: Enteric tube is present with multiple coils in the left upper quadrant and tip at the midline. Location of the tip is consistent with the distal stomach. Paucity of gas in the abdomen. IMPRESSION: Enteric tube coils in the left upper quadrant with tip in the distal stomach. Electronically Signed   By: Lucienne Capers M.D.   On: 08/06/2018 23:31   Ct Head Code Stroke Wo Contrast  Result Date: 08/29/2018 CLINICAL DATA:  Code stroke. EXAM: CT HEAD WITHOUT CONTRAST  TECHNIQUE: Contiguous axial images were obtained from the base of the skull through the vertex without  intravenous contrast. COMPARISON:  CT head 08/06/2018 earlier today FINDINGS: Brain: Patchy white matter hypodensity unchanged. No new areas of hypodensity. No acute infarct, hemorrhage, mass. Ventricle size normal. Vascular: Negative for hyperdense vessel Skull: Negative Sinuses/Orbits: Negative Other: None ASPECTS ([place] Stroke Program Early CT Score) - Ganglionic level infarction (caudate, lentiform nuclei, internal capsule, insula, M1-M3 cortex): 7 - Supraganglionic infarction (M4-M6 cortex): 3 Total score (0-10 with 10 being normal): 10 IMPRESSION: 1. No acute abnormality and no change from earlier today 2. ASPECTS is 10 3. These results were called by telephone at the time of interpretation on 09/04/2018 at 8:55 am to Dr. Rory Percy, who verbally acknowledged these results. Electronically Signed   By: Franchot Gallo M.D.   On: 08/30/2018 08:56   Telemetry    08/15/18 NSR- Personally Reviewed  ECG    No new tracings as of 08/15/18 - Personally Reviewed  Cardiac Studies   ECHO: 02/25/12014: LV EF: 55% -  60%  ------------------------------------------------------------ Indications:   Abnormal EKG 794.31. Chest pain 786.51.  ------------------------------------------------------------ History:  PMH: Acquired from the patient and from the patient's chart. Chest pain. Risk factors: Hypertension. Diabetes mellitus. Morbidly obese. Dyslipidemia.  ------------------------------------------------------------ Study Conclusions  - Left ventricle: The cavity size was mildly dilated. There was mild concentric hypertrophy. Systolic function was normal. The estimated ejection fraction was in the range of 55% to 60%. Wall motion was normal; there were no regional wall motion abnormalities. Doppler parameters are consistent with abnormal left ventricular relaxation (grade 1 diastolic dysfunction). - Left atrium: The atrium was mildly dilated.   Nuclear Stress Test  12/31/12: Impression Exercise Capacity: Poor exercise capacity. BP Response: Hypertensive blood pressure response. Clinical Symptoms: Dyspnea.  ECG Impression: NSR with IVCD at baseline. Patient had baseline anterolateral and inferior ST depression, probably no significant changes with exercise.  Comparison with Prior Nuclear Study: No images to compare  Patient Profile     57 y.o. female with a hx of resistant hypertension, insulin-dependent DM 2, obesity, iron deficiency anemia and hyperthyroidism s/p ablation who was initially seen in consultation for the evaluation of chest pain at the request of Dr. Myna Hidalgo found to have acute stroke.   Assessment & Plan    1. Chest pain: -Patient initially presented to [facility] ED on 08/31/2018 with complaints of chest and left arm discomfort, found to be in hypertensive crisis/urgency with a BP of 220/117 on presentation. She was given her home antihypertensives and NTG with CP resolution>>no recurrent symptoms since [facility] presentation. Unfortunately, upon interview today, the patient has right upper and lower extremity weakness with slurred speech found to have an acute nonhemorrhagic infarct involving the left paramedian brainstem. -Last stress test 2014 with no ischemia or infarction, low risk study -Last echocardiogram 12/30/2012 with LV EF of 55 to 60% with no wall motion abnormality G1 DD -EKG, NSR with no acute ischemic changes with mild evidence of LVH -Continue ASA, statin, beta-blocker -On Brilinta for basilar artery stent placement  -Given her acute stroke, would not consider further chest pain workup at this time.  2. 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 Acute respiratory failure: -Pt intubated for interventional procedure,  extubated post-op however placed on Bipap secondary to acute respiratory failure in the setting of acute infarct and unable to clear secretions -Pt re-intubated yesterday evening and is doing well this afternoon on minimal sedation  -Per primary team and PCCM    4. Hypertension with hypertensive urgency: -Continues to have elevated BP's, a-line in place  -BP 220/117 on presentation noted to be resistant, 141/68>144/71>135/73>151/74 -Continue Norvasc, losartan, lopressor 29m per tube   3. Insulin dependent DM: -Last hemoglobin A1c 12.8 on 01/12/2017, uncontrolled -SSI for glucose control while inpatient status, per primary team  4. CKD Stage III: -Creatinine, 1.28 on presentation>>>down to 1.04 today  -Baseline appears to be in the 1.0-1.1 range -Continue to avoid nephrotoxic medications  Signed, JKathyrn DrownNP-C HTalmagePager: [phone]/09/2018, 1:41 PM   For questions or updates, please contact   Please consult [email or web address] for contact info under Cardiology/STEMI.

## 2018-08-15 NOTE — Progress Notes (Signed)
Pt transported from 4N32, to CT, MRI and back without incident.

## 2018-08-15 NOTE — Progress Notes (Signed)
Initial Nutrition Assessment  DOCUMENTATION CODES:   Morbid obesity  INTERVENTION:  Vital High Protein @ 10 mL/hr + 2 Pro-Stat QID   This regimen provides 1040 kcal, 141 grams protein, and 201 mL free water   MVI daily  Cleviprex drip at 42 mL/hr currently providing 84 kcal/hr and 2016 kcal/day. Will monitor rate and adjust TF accordingly.  Provides 3056 kcal in total.   NUTRITION DIAGNOSIS:   Inadequate oral intake related to inability to eat as evidenced by NPO status.  GOAL:   Patient will meet greater than or equal to 90% of their needs  MONITOR:   Vent status, TF tolerance, I & O's, Labs  REASON FOR ASSESSMENT:   Malnutrition Screening Tool, Ventilator   ASSESSMENT:   Andrea [name] is a 57 yo female with PMH of type 2 diabetes, HTN, CKD III, obesity, and anemia admitted for chest pain. Code Stroke called 10/10 in AM. 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 Intubated 10/10 in ICU.   Visited Andrea [name] in room today with sister and mother present. Patient intubated; hx provided by sister and mother. They report that Andrea [name] has had trouble affording and obtaining her medications for hypertension and diabetes due to not having insurance. PTA she was living with her son and grandchildren. Family says that grandchildren's mother was killed so she takes a lot of the responsibility for the children including taking them to school and activities. They say that her meds make her tired and take away from energy she needs to take care of the grandchildren. PTA her appetite, intake, and weight were normal for her. She eats a lot of fast food and drinks soda. Per her labs she has poor control of diabetes (A1C = 11.4). Per MD goal is to maintain glycemic control to avoid cerebral swelling.  Per RN Pt currently off Propofol drip. Still on Cleviprex drip @ 42 mL/hr providing 84 kcal/hr and 2016 kcal/day. To avoid overfeeding will start TF at a low rate and adjust according to  Cleviprex rate.   Medications reviewed and include: Novolog, Cleviprex Labs reviewed: CBG 179-249, potassium 3.4, triglycerides 248, A1C 11.4    NUTRITION - FOCUSED PHYSICAL EXAM:    Most Recent Value  Orbital Region  No depletion  Upper Arm Region  No depletion  Thoracic and Lumbar Region  No depletion  Buccal Region  No depletion  Temple Region  No depletion  Clavicle Bone Region  No depletion  Clavicle and Acromion Bone Region  No depletion  Scapular Bone Region  No depletion  Dorsal Hand  No depletion  Patellar Region  No depletion  Anterior Thigh Region  No depletion  Posterior Calf Region  No depletion  Edema (RD Assessment)  Mild  Hair  Reviewed  Eyes  Reviewed  Mouth  Reviewed  Skin  Reviewed  Nails  Reviewed      Diet Order:   Diet Order            Diet NPO time specified  Diet effective now              EDUCATION NEEDS:   Not appropriate for education at this time  Skin:  Skin Assessment: Reviewed RN Assessment  Last BM:  10/9  Height:   Ht Readings from Last 1 Encounters:  08/15/18 5\' 4"  (1.626 m)    Weight:   Wt Readings from Last 1 Encounters:  08/15/18 123.2 kg    Ideal Body Weight:  54.5 kg  BMI:  Body mass index is 46.62 kg/m.  Estimated Nutritional Needs:   Kcal:  1355-1725 (11-14 kcal/kg BW)  Protein:  136 grams (2.5 g/kg IBW)   Fluid:  >1.5 L/day    Andrea [name], Dietetic Intern [phone]

## 2018-08-15 NOTE — Progress Notes (Signed)
  Echocardiogram 2D Echocardiogram has been performed.  Bobbye Charleston 08/15/2018, 1:18 PM

## 2018-08-15 NOTE — CV Procedure (Signed)
2D echo attempted, RN requested I come back later.

## 2018-08-15 NOTE — Consult Note (Signed)
NAME:  Andrea [name], MRN:  [id number], DOB:  October 09, 1961, LOS: 1 ADMISSION DATE:  08/10/2018, CONSULTATION DATE:  09/01/2018 REFERRING MD:  Dr. Rory Percy, CHIEF COMPLAINT:  CP/ Acute CVA  Brief History   28 yoF w/poorly controlled HTN and IDDM presenting with CP and left arm pain w/BP 220/117. Initial CTH neg.  Cardiac workup negative for acute event thus far, cardiology following.  Code stroke initiated for R hemiplegia, dysarthria and right facial droop.  Out of window for TPA.  Requiring cleviprex for BP control.  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  Evolving pontine stroke on MRI. Intubated for neuro IR s/p stenting and angioplasty with 80-90% patency.  Extubated post procedure but due to insufficency placed on BiPAP and returns to ICU.    Past Medical History  HTN, IDDM, hypothyroidism s/p ablation  Significant Hospital Events   10/9 present to [facility] ED -transferred to [facility] 10/9 basilar artery stenting 10/10 CTA for declining exam - evolving pontine infarct, stent deemed to be patent  Consults: date of consult/date signed off & final recs:  Cards 10/10 Neurology 10/10 PCCM 10/10  Procedures (surgical and bedside):  10/10 Neuro IR >> s/p stent and angioplasty of distal basilar artery  10/10 ETT for procedure 10/10 L R art line >>  Significant Diagnostic Tests:  08/12/2018  CTH >>  No acute intracranial abnormalities. Mild white matter changes likely due to small vessel ischemia  08/1018 CTH >> 1. No acute abnormality and no change from earlier today 2. ASPECTS is 10  08/16/2018  CTA head and neck >>  1. 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 No definite acute thrombus identified. 2. Severe stenosis left posterior cerebral artery and moderate stenosis right posterior cerebral artery. 3. Mild stenosis left MCA and moderate stenosis left MCA bifurcation. 4. No significant carotid or  vertebral artery stenosis in the neck.  08/18/2018 MRI - evolving pontine infarct.   Micro Data:  08/30/2018 MRSA PCR >>  Antimicrobials:  10/10 cefazolin preop  Subjective:  Patient required intubation last night for inability to protect airway.  No longer following commands this morning.  Went for urgent CT scan of head which showed evolving infarct but stent patency.  Objective   Blood pressure (!) 144/71, pulse 66, temperature 99.5 F (37.5 C), temperature source Axillary, resp. rate (!) 23, height 5\' 4"  (1.626 m), weight 123.2 kg, last menstrual period 07/26/2018, SpO2 99 %.    Vent Mode: PRVC FiO2 (%):  [30 %-100 %] 30 % Set Rate:  [15 bmp] 15 bmp Vt Set:  [430 mL] 430 mL PEEP:  [5 cmH20] 5 cmH20 Plateau Pressure:  [10 cmH20-16 cmH20] 10 cmH20   Intake/Output Summary (Last 24 hours) at 08/15/2018 1135 Last data filed at 08/15/2018 1100 Gross per 24 hour  Intake 3416.01 ml  Output 2560 ml  Net 856.01 ml   Filed Weights   08/17/2018 0452 09/04/2018 1430 08/15/18 0515  Weight: 124.6 kg 128.3 kg 123.2 kg    Examination: General: Well-nourished adult woman. HEENT: Tracheal and orogastric tubes in place with no skin breakdown. Neuro: Opens eyes to command and tracks to voice.  Left gaze nystagmus.  Not following commands in the limbs.  Flexor response to painful stimuli symmetrically  PULM: Mechanically ventilated with mild tachypnea.  No ventilator asynchrony CV: Remains warm and well-perfused heart sounds are unremarkable.  Patient remains hypertensive on a Cleviprex drip GI: obese,  soft, non-tender, bs active.  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  Has lost ability to follow commands in her limbs.  At risk for further edema over next 24 to 48 hours. Continue strict blood pressure control to limit risk of hemorrhagic  conversion. Aspirin and Brilinta for stent patency Maintain normal glycemia. Keep sodium greater than 145.  Respiratory Insufficiency due to inability to protect airway from dysarthria.  Expected with location of stroke. Continue full mechanical ventilatory support. Will consider early tracheostomy once patient is through the acute phase of stroke as swallowing dysfunction likely to persist.   Hypertensive Crisis - poorly controlled resistant hypertension Titrate Cleviprex to keep this [place] blood pressure less than 140. Will introduce home medications.   Microcytic Anemia Trend CBC Transfuse per protocol   Chest pain of unclear etiology.  Likely on noncardiac given negative troponin - no pain at present TTE as part of stroke work-up  Type 2 diabetes now on insulin Tight blood pressure control to prevent cerebral swelling  Hypothyroidism  - TSH 1.338 Resume oral Synthroid  Disposition / Summary of Today's Plan 08/15/18   Currently entering peak swelling window at risk for significant decompensation given location of the stroke.  Avoid hyponatremia and hyperglycemia.  Maintain adequate cerebral perfusion pressure.    Diet: Tube feeds initiated Pain/Anxiety/Delirium protocol (if indicated): N/a VAP protocol (if indicated): no DVT prophylaxis: SCDs only -we will reassess chemical prophylaxis after 48 hours  GI prophylaxis: Famotidine Hyperglycemia protocol: SSI Mobility: bedrest Code Status: Full  Family Communication: Patient updated.   Labs ( personally  Reviewed)  CBC: Recent Labs  Lab 09/02/2018 2208 08/21/2018 1028 08/15/18 0512  WBC 8.3 9.4 11.1*  NEUTROABS  --  5.2 9.1*  HGB 10.8* 11.1* 9.7*  HCT 35.7* 36.0 32.4*  MCV 78.5* 76.9* 77.1*  PLT 409* 418* 409*    Basic Metabolic Panel: Recent Labs  Lab 09/04/2018 2208 08/08/2018 0317 09/03/2018 1028 08/20/2018 2015 08/15/18 0512  NA 141 140 136  --  137  K 3.7 3.4* 3.4*  --  3.1*  CL 107 106 105  --  108  CO2  24 22 21*  --  18*  GLUCOSE 251* 252* 255*  --  204*  BUN 17 17 12   --  13  CREATININE 1.28* 1.15* 1.03*  --  1.13*  CALCIUM 10.8* 10.3 10.6*  --  9.5  MG  --   --   --  1.4*  --   PHOS  --   --   --  3.3  --    GFR: Estimated Creatinine Clearance: 71.2 mL/min (A) (by C-G formula based on SCr of 1.13 mg/dL (H)). Recent Labs  Lab 08/27/2018 2208 08/28/2018 1028 08/15/18 0512  WBC 8.3 9.4 11.1*    Liver Function Tests: No results for input(s): AST, ALT, ALKPHOS, BILITOT, PROT, ALBUMIN in the last 168 hours. No results for input(s): LIPASE, AMYLASE in the last 168 hours. No results for input(s): AMMONIA in the last 168 hours.  ABG    Component Value Date/Time   PHART 7.334 (L) 08/15/2018 2259   PCO2ART 41.9 08/28/2018 2259   PO2ART 275.0 (H) 08/10/2018 2259   HCO3 22.3 08/30/2018 2259   TCO2 24 09/03/2018 2259   ACIDBASEDEF 3.0 (H) 08/10/2018 2259   O2SAT 100.0 08/17/2018 2259     Coagulation Profile: Recent  Labs  Lab 09/03/2018 1028  INR 1.02    Cardiac Enzymes: Recent Labs  Lab 08/24/2018 0317 09/02/2018 2015  TROPONINI 0.03* <0.03    HbA1C: Hgb A1c MFr Bld  Date/Time Value Ref Range Status  08/15/2018 05:12 AM 11.4 (H) 4.8 - 5.6 % Final    Comment:    (NOTE) Pre diabetes:          5.7%-6.4% Diabetes:              >6.4% Glycemic control for   <7.0% adults with diabetes   01/12/2017 02:43 AM 12.8 (H) 4.8 - 5.6 % Final    Comment:    (NOTE)         Pre-diabetes: 5.7 - 6.4         Diabetes: >6.4         Glycemic control for adults with diabetes: <7.0     CBG: Recent Labs  Lab 08/16/2018 1959 08/13/2018 2335 08/15/18 0349 08/15/18 0814 08/15/18 1124  GLUCAP 249* 273* 216* 154* 179*   CRITICAL CARE Performed by: Kipp Brood   Total critical care time: 35 minutes  Critical care time was exclusive of separately billable procedures and treating other patients.  Critical care was necessary to treat or prevent imminent or life-threatening  deterioration.  Critical care was time spent personally by me on the following activities: development of treatment plan with patient and/or surrogate as well as nursing, discussions with consultants, evaluation of patient's response to treatment, examination of patient, obtaining history from patient or surrogate, ordering and performing treatments and interventions, ordering and review of laboratory studies, ordering and review of radiographic studies, pulse oximetry and re-evaluation of patient's condition.  Kipp Brood, MD [facility] ICU Physician [place]  Pager: [phone] Mobile: [phone] After hours: [phone].   08/15/2018, 11:35 AM

## 2018-08-15 NOTE — Progress Notes (Signed)
Drugs used during RSI are as follows 2115: 30 mg etomidate, 4 mg versed, & 50 mcg fentanyl 2130: 100 mg rocuronium, 4 mg versed  Total amount used: 30 mg etomidate 8 mg versed 50 mcg fentanyl 100 mg rocuronium

## 2018-08-15 NOTE — Progress Notes (Signed)
OT Cancellation Note  Patient Details Name: Andrea [name] MRN: [id number] DOB: 10-19-1961   Cancelled Treatment:    Reason Eval/Treat Not Completed: Patient not medically ready.  Pt on vent and sedated   Lucille Passy, OTR/L [place] Pager [phone] Office [phone]  Lucille Passy M 08/15/2018, 5:46 AM

## 2018-08-15 NOTE — Progress Notes (Addendum)
STROKE TEAM PROGRESS NOTE   SUBJECTIVE (INTERVAL HISTORY) Her mom is at the bedside.  Pt has worsening neuro symptoms with now b/l involvement. BUE 0/5 and BLE 2+/5. More lethargic but still able to follow simple commands. CTA head and neck and MRI repeated showed extension of brain stem infarcts as well new infarcts b/l cerebellum    OBJECTIVE Vitals:   08/15/18 0845 08/15/18 0900 08/15/18 0939 08/15/18 0943  BP: 138/78 (!) 161/83 (!) 151/74 135/73  Pulse: 80 66 75   Resp: (!) 21 (!) 29    Temp:      TempSrc:      SpO2: 100% 100% 100%   Weight:      Height:        CBC:  Recent Labs  Lab 08/19/2018 1028 08/15/18 0512  WBC 9.4 11.1*  NEUTROABS 5.2 9.1*  HGB 11.1* 9.7*  HCT 36.0 32.4*  MCV 76.9* 77.1*  PLT 418* 409*    Basic Metabolic Panel:  Recent Labs  Lab 08/08/2018 1028 08/23/2018 2015 08/15/18 0512  NA 136  --  137  K 3.4*  --  3.1*  CL 105  --  108  CO2 21*  --  18*  GLUCOSE 255*  --  204*  BUN 12  --  13  CREATININE 1.03*  --  1.13*  CALCIUM 10.6*  --  9.5  MG  --  1.4*  --   PHOS  --  3.3  --     Lipid Panel:     Component Value Date/Time   CHOL 256 (H) 08/15/2018 0512   TRIG 248 (H) 08/15/2018 0512   HDL 35 (L) 08/15/2018 0512   CHOLHDL 7.3 08/15/2018 0512   VLDL 50 (H) 08/15/2018 0512   LDLCALC 171 (H) 08/15/2018 0512   HgbA1c:  Lab Results  Component Value Date   HGBA1C 11.4 (H) 08/15/2018   Urine Drug Screen:     Component Value Date/Time   LABOPIA NONE DETECTED 09/01/2018 1836   COCAINSCRNUR NONE DETECTED 08/30/2018 1836   LABBENZ NONE DETECTED 08/22/2018 1836   AMPHETMU NONE DETECTED 08/12/2018 1836   THCU NONE DETECTED 08/25/2018 1836   LABBARB NONE DETECTED 08/30/2018 1836    Alcohol Level No results found for: ETH  IMAGING  Ct Angio Head W Or Wo Contrast Ct Angio Neck W Or Wo Contrast 08/15/2018 IMPRESSION:  1. Interval basilar to left proximal PCA stenting. The density of the stent walls precludes detection of in stent  stenosis; there is a degree of wasting at the mid basilar segment. There is flow in the left PCA beyond the stent such that there is presumed stent patency.  2. Known lower pontine infarct. There are new small cerebellar infarcts since brain MRI yesterday.  3. Severe left P2 segment stenosis, also seen previously.  4. Moderate atheromatous narrowing in the proximal left MCA.    Ct Angio Head W Or Wo Contrast Ct Angio Neck W Or Wo Contrast 08/13/2018 IMPRESSION:  1. 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 No definite acute thrombus identified.  2. Severe stenosis left posterior cerebral artery and moderate stenosis right posterior cerebral artery.  3. Mild stenosis left MCA and moderate stenosis left MCA bifurcation.  4. No significant carotid or vertebral artery stenosis in the neck.    Ct Head Wo Contrast 08/13/2018 IMPRESSION:  No acute intracranial abnormalities. Mild white matter changes likely due to small vessel ischemia.    Mr  Brain Wo Contrast 08/29/2018 IMPRESSION:  1. Acute nonhemorrhagic infarct involving the left paramedian brainstem. The infarct crosses midline.  2. Other periventricular and subcortical white matter disease is moderately advanced for age. This likely reflects the sequela of chronic microvascular ischemia.  3. Tapering of the dens with prominent soft tissue pannus. 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    Ct Head Code Stroke Wo Contrast 08/31/2018  IMPRESSION:  1. No acute abnormality and no change from earlier today  2. ASPECTS is 10 3.    Portable Chest X-ray 08/23/2018 IMPRESSION:  Interval placement of endotracheal tube. Question developing perihilar edema or infectious process. Gaseous distension of the stomach.   Cerebral Angiogram 08/06/2018 S/P 4 vessel cerebral arteriogram RT CFA approach. Findings  1.Severe stenosis of distal basilar artery 90 % ,associated  with mod to severe ASVD of the mid basilar artery and Lt ANt cerebellar A. S/P stent assisted angioplasty of distal basilar artery with patency of 80 to 90 %. 2.Approx 70 % stenosis of LT ICA supraclinoid seg   MRI 08/15/18 1. Progressive acute pontine infarct. New patchy bilateral cerebellar infarction. New tiny left thalamic infarcts. 2. Preserved flow void in the stented basilar. 3. left facial/submandibular edema, please correlate with neck exam.   Transthoracic Echocardiogram - pending    PHYSICAL EXAM  Temp:  [97.7 F (36.5 C)-99.6 F (37.6 C)] 99.5 F (37.5 C) (10/11 0819) Pulse Rate:  [65-111] 75 (10/11 0939) Resp:  [16-32] 29 (10/11 0900) BP: (124-235)/(66-116) 135/73 (10/11 0943) SpO2:  [96 %-100 %] 100 % (10/11 0939) Arterial Line BP: (133-249)/(58-103) 170/67 (10/11 0815) FiO2 (%):  [30 %-100 %] 30 % (10/11 0819) Weight:  [123.2 kg-128.3 kg] 123.2 kg (10/11 0515)  General - Well nourished, well developed, intubated.  Ophthalmologic - fundi not visualized due to noncooperation.  Cardiovascular - Regular rate and rhythm.  Neuro - awake, eyes open, very lethargic, intubated, on low dose propofol. Left mild ptosis, disconjugate eyes with right eye upward deviation. Right gaze incomplete with rotational nystagmus. PERRL, not consistent blinking to visual threat bilaterally. Facial symmetry not able to test due to ET tube. Tongue in mouth deviated to the right. BUE no spontaneous movement, mild withdraw to pain. BLE proximal 2+/5 on pain, 0/5 distally, b/l positive babinski. Sensation, coordination and gait not tested.     ASSESSMENT/PLAN Ms. MALIAKA BRASINGTON is a 57 y.o. female with history of difficult to control hypertension, insulin-dependent diabetes, obesity, hypothyroidism status post ablation presenting with chest discomfort, right-sided weakness, slurred speech and right facial droop. She did not receive IV t-PA due to late presentation. S/P stent assisted  angioplasty of distal basilar artery with patency of 80 to 90 %.  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 Worsening symptoms with extension of pontine/medullary infarcts with new small bilateral cerebellar infarcts without evidence of stent re-stenosis or occlusion  Resultant b// UE weakness, eye disconjugate, lethargy  CT head - No acute intracranial abnormalities.  CTA H&N 08/18/2018 - Extensive atherosclerotic disease in the basilar with focal critical stenosis in the mid to distal basilar. No definite acute thrombus identified. Severe stenosis left posterior cerebral artery  MRI head - Acute nonhemorrhagic infarct involving the left paramedian brainstem.   CTA H&N 08/15/2018 - stent to dense to see BA lumen, but distal flow preserved. New small cerebellar infarcts since brain MRI yesterday.  Severe left P2 segment stenosis, also seen previously.   Repeat MRI - extension of b/l pontine and  upper medullary infarcts with new b/l cerebellar infarcts.  2D Echo - pending  LDL - 174  HgbA1c - 11.1  P2Y12 = 35  VTE prophylaxis - heparin subq  Diet - NPO  aspirin 81 mg daily prior to admission, now on Brilinta 90 mg daaily and ASA 81 mg daily  Patient counseled to be compliant with her antithrombotic medications  Ongoing aggressive stroke risk factor management  Therapy recommendations:  pending  Disposition:  Pending  Intracranial stenosis  BA mid to distal severe stenosis s/p BA stenting  Left PCA severe stenosis, R PCA moderate stenosis  Left MCA moderate stenosis  Uncontrolled stroke risk factors with HLD, HTN, DM  Non compliance with meds at home  Respiratory failure  Intubated  CCM on board  Continue vent support  Hypertension  Stable on the high end  On IV Cleviprex   BP goal 130-180   Avoid low BP  Hyperlipidemia  Lipid lowering medication PTA:  Lipitor 20 mg daily  LDL 174, goal <  70  increase to 80 mg daily  Continue statin at discharge  Diabetes  HgbA1c 11.4, goal < 7.0  Uncontrolled  SSI  CBG monitoring  Other Stroke Risk Factors  Obesity, Body mass index is 46.62 kg/m., recommend weight loss, diet and exercise as appropriate   Other Active Problems  Anemia due to acute blood loss - 11.1->9.7  Elevated creatinine - 1.03->1.13  Hypokalemia - 3.1 -> supplement  Leukocytosis - 9.4->11.1  Hospital day # 1  This patient is critically ill due to worsening brainstem infarct, BA stenosis s/p stenting,  and at significant risk of neurological worsening, death form recurrent stroke, hemorrhagic conversion, CHF, seizure, aspiration. This patient's care requires constant monitoring of vital signs, hemodynamics, respiratory and cardiac monitoring, review of multiple databases, neurological assessment, discussion with family, other specialists and medical decision making of high complexity. I spent 50 minutes of neurocritical care time in the care of this patient. I had long discussion with mom at bedside, updated pt current condition, treatment plan and potential prognosis. She expressed understanding and appreciation.   Rosalin Hawking, MD PhD Stroke Neurology 08/15/2018 11:57 AM   To contact Stroke Continuity provider, please refer to [email or web address] After hours, contact General Neurology

## 2018-08-15 NOTE — Progress Notes (Signed)
Pt continuously double stacking the vent and breathing over 30 times a minute, propofol increased to help with this, will go down on clev to keep BP within parameters

## 2018-08-15 NOTE — Plan of Care (Signed)
  Problem: Education: Goal: Knowledge of General Education information will improve Description Including pain rating scale, medication(s)/side effects and non-pharmacologic comfort measures Outcome: Progressing   Problem: Cardiac: Goal: Ability to achieve and maintain adequate cardiovascular perfusion will improve Outcome: Progressing Note:  No complaints of chest pain.

## 2018-08-15 NOTE — Progress Notes (Signed)
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 Estanislado Pandy. Patient awake and alert laying in bed intubated but not sedated. She is breathing over vent. Can wiggle left toes and bend left knee, no spontaneous movements of right leg or bilateral upper extremities. Sustained horizontal nystagmus with right gaze. Right groin incision c/d/i.   Allergies: Tramadol  Medications: Prior to Admission medications   Medication Sig Start Date End Date Taking? Authorizing Provider  amLODipine (NORVASC) 10 MG tablet Take 1 tablet (10 mg total) by mouth daily. 05/16/18  Yes Elsie Stain, MD  aspirin EC 81 MG tablet Take 81 mg by mouth daily.   Yes [provider]  atorvastatin (LIPITOR) 20 MG tablet Take 1 tablet (20 mg total) by mouth daily. 01/29/18  Yes Verner Mould, MD  benzonatate (TESSALON) 100 MG capsule Take 1 capsule (100 mg total) by mouth every 8 (eight) hours. 12/16/17  Yes Maczis, Barth Kirks, PA-C  cloNIDine (CATAPRES) 0.2 MG tablet Take 1.5 tablets (0.3 mg total) by mouth 2 (two) times daily. 05/16/18  Yes Elsie Stain, MD  ferrous sulfate 325 (65 FE) MG EC tablet Take 1 tablet (325 mg total) by mouth 3 (three) times daily with meals. 01/17/17  Yes Hensel, Jamal Collin, MD  gabapentin (NEURONTIN) 300 MG capsule Take 1 capsule (300 mg total) by mouth 3 (three) times daily. Patient taking differently: Take 300 mg by mouth 3 (three) times daily as needed (nerve pain).  12/26/16  Yes Lysbeth Penner, FNP  hydrochlorothiazide (HYDRODIURIL) 25 MG tablet TAKE 1 TABLET (25 MG TOTAL) BY MOUTH DAILY. 05/16/18  Yes Elsie Stain, MD  ibuprofen (ADVIL,MOTRIN) 800 MG tablet Take 1 tablet (800 mg total) by mouth every 8 (eight) hours as needed. Patient taking differently: Take  800 mg by mouth every 8 (eight) hours as needed for headache or mild pain.  01/19/17  Yes Lawyer, Harrell Gave, PA-C  Insulin Glargine (LANTUS SOLOSTAR) 100 UNIT/ML Solostar Pen INJECT 27 UNITS INTO THE SKIN AT BEDTIME. 05/16/18  Yes Elsie Stain, MD  losartan (COZAAR) 25 MG tablet Take 1 tablet (25 mg total) by mouth daily. 05/16/18  Yes Elsie Stain, MD  megestrol (MEGACE) 40 MG tablet TAKE 2 TABLETS BY MOUTH TWICE A DAY , MAY INCREASE TO 3 TABLETS TWICE DAILY FOR HEAVY BLEEDING Patient taking differently: Take 80 mg by mouth 2 (two) times daily. May increase to 3 tablets twice daily for heavy bleeding 11/26/17  Yes Anyanwu, Sallyanne Havers, MD  metFORMIN (GLUCOPHAGE) 1000 MG tablet Take 1 tablet (1,000 mg total) by mouth 2 (two) times daily with a meal. 05/16/18  Yes Elsie Stain, MD  metoprolol succinate (TOPROL XL) 50 MG 24 hr tablet Take 1 tablet (50 mg total) by mouth daily. Take with or immediately following a meal. 05/16/18  Yes Elsie Stain, MD  VOLTAREN 1 % GEL APPLY 2 G TOPICALLY 4 (FOUR) TIMES DAILY. Patient taking differently: Apply 2 g topically 4 (four) times daily as needed (pain).  01/16/17  Yes Bacigalupo, Dionne Bucy, MD  Insulin Pen Needle (B-D ULTRAFINE III SHORT PEN) 31G X 8 MM MISC USE AS DIRECTED TO INJECT INSULIN AT BEDTIME 01/15/18   Verner Mould, MD  Insulin Syringe-Needle U-100 (INSULIN SYRINGE .5CC/30GX1/2") 30G X 1/2" 0.5 ML MISC Use to inject insulin daily  as prescribed 01/23/17   Virginia Crews, MD  sertraline (ZOLOFT) 50 MG tablet Take 50 mg by mouth daily.  01/19/12  [provider]     Vital Signs: BP (!) 154/76   Pulse 98   Temp 98.3 F (36.8 C) (Axillary)   Resp (!) 28   Ht 5\' 4"  (1.626 m)   Wt 271 lb 9.7 oz (123.2 kg)   LMP 07/26/2018   SpO2 99%   BMI 46.62 kg/m   Physical Exam  Constitutional: She appears well-developed and well-nourished. No distress.  Intubated.  Pulmonary/Chest: Effort normal. No respiratory  distress.  Intubated.  Neurological:  Awake and alert, intubated but not sedated, breathing over vent. Speech not assessed, comprehension intact. PERRL bilaterally. EOMs intact bilaterally with horizontal nystagmus on right gaze, no subjective diplopia. Visual fields not assessed. No facial asymmetry. Tongue protrusion not assessed. Can wiggle left toes and bend left knee, no spontaneous movements of right leg or bilateral upper extremities. Pronator drift not assessed. Fine motor and coordination not assessed. Gait not assessed. Romberg not assessed. Heel to toe not assessed. Distal pulses 2+ bilaterally.  Skin: Skin is warm and dry.  Right groin incision soft without active bleeding or hematoma.  Psychiatric:  Intubated.    Imaging: Ct Angio Head W Or Wo Contrast  Result Date: 08/15/2018 CLINICAL DATA:  Stroke follow-up EXAM: CT ANGIOGRAPHY HEAD AND NECK TECHNIQUE: Multidetector CT imaging of the head and neck was performed using the standard protocol during bolus administration of intravenous contrast. Multiplanar CT image reconstructions and MIPs were obtained to evaluate the vascular anatomy. Carotid stenosis measurements (when applicable) are obtained utilizing NASCET criteria, using the distal internal carotid diameter as the denominator. CONTRAST:  76mL ISOVUE-370 IOPAMIDOL (ISOVUE-370) INJECTION 76% COMPARISON:  CTA of the head neck from yesterday FINDINGS: CT HEAD FINDINGS Brain: Known infarct in the lower pons. There is patchy right more than left superior cerebellar infarctions not seen on prior brain MRI. Chronic small vessel ischemic change in the cerebral white matter. No hemorrhage, hydrocephalus, or mass effect. Vascular: See below. Skull: No acute finding Sinuses: Negative Orbits: Negative Review of the MIP images confirms the above findings CTA NECK FINDINGS Aortic arch: Partial coverage is negative.  Three vessel branching. Right carotid system: Mild atherosclerotic  calcification. No stenosis or ulceration. Left carotid system: Mild atherosclerotic calcification. No stenosis or ulceration. Vertebral arteries: No proximal subclavian stenosis. Left dominant vertebral artery. Both vertebral arteries are widely patent to the dura when allowing for artifact related to bolus density and patient size. Skeleton: No acute or aggressive finding. Other neck: Tracheal and esophageal intubation.  Thyromegaly. Upper chest: Negative Review of the MIP images confirms the above findings CTA HEAD FINDINGS Anterior circulation: Atheromatous irregularity of the left MCA with mild proximal M1 and more moderate bifurcation stenosis. Due to technical limitations there is limited evaluation of branches, with no major branch occlusion seen. Posterior circulation: Left vertebral artery dominance. Most of right vertebral flow is into the right PICA. There is been distal basilar to left proximal PCA stenting. The intrinsic density of the stent limits assessment of in stent patency. There is likely a degree of waisting best seen on precontrast imaging, seen at the level of the mid basilar segment. The stent is patent based on flow seen in the proximal left PCA. There is a unchanged advanced left P2 segment stenosis. Posterior communicating arteries, larger on the right. Venous sinuses: Patent Anatomic variants: None significant Delayed phase: No abnormal intracranial enhancement.  These results were called by telephone at the time of interpretation on 08/15/2018 at 9:38 am to Dr. Erlinda Hong, who verbally acknowledged these results. Review of the MIP images confirms the above findings IMPRESSION: 1. Interval basilar to left proximal PCA stenting. The density of the stent walls precludes detection of in stent stenosis; there is a degree of wasting at the mid basilar segment. There is flow in the left PCA beyond the stent such that there is presumed stent patency. 2. Known lower pontine infarct. There are new small  cerebellar infarcts since brain MRI yesterday. 3. Severe left P2 segment stenosis, also seen previously. 4. Moderate atheromatous narrowing in the proximal left MCA. Electronically Signed   By: Monte Fantasia M.D.   On: 08/15/2018 09:41   Ct Angio Head W Or Wo Contrast  Result Date: 09/04/2018 CLINICAL DATA:  Code stroke EXAM: CT ANGIOGRAPHY HEAD AND NECK TECHNIQUE: Multidetector CT imaging of the head and neck was performed using the standard protocol during bolus administration of intravenous contrast. Multiplanar CT image reconstructions and MIPs were obtained to evaluate the vascular anatomy. Carotid stenosis measurements (when applicable) are obtained utilizing NASCET criteria, using the distal internal carotid diameter as the denominator. CONTRAST:  9mL ISOVUE-370 IOPAMIDOL (ISOVUE-370) INJECTION 76% COMPARISON:  CT head 08/06/2018 FINDINGS: CTA NECK FINDINGS Aortic arch: Standard branching. Imaged portion shows no evidence of aneurysm or dissection. No significant stenosis of the major arch vessel origins. Right carotid system: Right carotid widely patent without stenosis. Minimal atherosclerotic disease right carotid bifurcation with small calcification carotid bulb Left carotid system: Left carotid widely patent without stenosis. Minimal atherosclerotic calcification left carotid bulb. Vertebral arteries: Left vertebral dominant. Left vertebral artery patent to the basilar without stenosis. Right vertebral is small and ends in PICA most likely a congenital variant. Skeleton: Cervical spondylosis without acute skeletal abnormality. Other neck: 3 cm right thyroid mass with heterogeneous enhancement. No adenopathy in the neck. Upper chest: Lung apices clear bilaterally. Review of the MIP images confirms the above findings CTA HEAD FINDINGS Anterior circulation: Cavernous carotid widely patent bilaterally without significant stenosis or atherosclerotic disease. Anterior and middle cerebral arteries  patent bilaterally. Mild atherosclerotic irregularity left M1 segment. Mild to moderate stenosis left MCA bifurcation. Negative for aneurysm. Posterior circulation: Right vertebral artery ends in PICA. Left vertebral artery supplies the basilar. PICA patent bilaterally. Irregular basilar due to diffuse atherosclerotic disease. Severe focal stenosis in the mid to distal basilar. Superior cerebellar and posterior cerebral arteries patent bilaterally. Atherosclerotic irregularity in the posterior cerebral artery with moderate stenosis right posterior cerebral artery and severe focal stenosis left posterior cerebral artery. Venous sinuses: Patent Anatomic variants: None Delayed phase: Not performed Review of the MIP images confirms the above findings IMPRESSION: 1. 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 No definite acute thrombus identified. 2. Severe stenosis left posterior cerebral artery and moderate stenosis right posterior cerebral artery. 3. Mild stenosis left MCA and moderate stenosis left MCA bifurcation. 4. No significant carotid or vertebral artery stenosis in the neck. 5. These results were called by telephone at the time of interpretation on 08/22/2018 at 08:55 Am to Dr. Rory Percy , who verbally acknowledged these results. Electronically Signed   By: Franchot Gallo M.D.   On: 08/09/2018 09:31   Dg Chest 2 View  Result Date: 08/16/2018 CLINICAL DATA:  Chest pain starting yesterday. EXAM: CHEST - 2 VIEW COMPARISON:  12/16/2017 FINDINGS: Stable cardiomegaly with atherosclerotic, slightly ectatic thoracic aorta.  Central pulmonary vascular congestion is noted without pulmonary consolidation. No effusion or pneumothorax. Thoracic spondylosis is noted. No acute osseous abnormality is seen. IMPRESSION: Cardiomegaly with mild central vascular congestion. Electronically Signed   By: Ashley Royalty M.D.   On: 08/25/2018 22:49   Ct Head Wo  Contrast  Result Date: 08/18/2018 CLINICAL DATA:  Funny feeling in the left arm. Slurred speech. History of hypertension and diabetes. EXAM: CT HEAD WITHOUT CONTRAST TECHNIQUE: Contiguous axial images were obtained from the base of the skull through the vertex without intravenous contrast. COMPARISON:  None. FINDINGS: Brain: No evidence of acute infarction, hemorrhage, hydrocephalus, extra-axial collection or mass lesion/mass effect. Patchy white matter low attenuation changes likely represent early small vessel ischemic change. Vascular: No hyperdense vessel or unexpected calcification. Skull: Normal. Negative for fracture or focal lesion. Sinuses/Orbits: Paranasal sinuses and mastoid air cells are clear. Other: None. IMPRESSION: No acute intracranial abnormalities. Mild white matter changes likely due to small vessel ischemia. Electronically Signed   By: Lucienne Capers M.D.   On: 08/12/2018 03:33   Ct Angio Neck W Or Wo Contrast  Result Date: 08/15/2018 CLINICAL DATA:  Stroke follow-up EXAM: CT ANGIOGRAPHY HEAD AND NECK TECHNIQUE: Multidetector CT imaging of the head and neck was performed using the standard protocol during bolus administration of intravenous contrast. Multiplanar CT image reconstructions and MIPs were obtained to evaluate the vascular anatomy. Carotid stenosis measurements (when applicable) are obtained utilizing NASCET criteria, using the distal internal carotid diameter as the denominator. CONTRAST:  35mL ISOVUE-370 IOPAMIDOL (ISOVUE-370) INJECTION 76% COMPARISON:  CTA of the head neck from yesterday FINDINGS: CT HEAD FINDINGS Brain: Known infarct in the lower pons. There is patchy right more than left superior cerebellar infarctions not seen on prior brain MRI. Chronic small vessel ischemic change in the cerebral white matter. No hemorrhage, hydrocephalus, or mass effect. Vascular: See below. Skull: No acute finding Sinuses: Negative Orbits: Negative Review of the MIP images confirms  the above findings CTA NECK FINDINGS Aortic arch: Partial coverage is negative.  Three vessel branching. Right carotid system: Mild atherosclerotic calcification. No stenosis or ulceration. Left carotid system: Mild atherosclerotic calcification. No stenosis or ulceration. Vertebral arteries: No proximal subclavian stenosis. Left dominant vertebral artery. Both vertebral arteries are widely patent to the dura when allowing for artifact related to bolus density and patient size. Skeleton: No acute or aggressive finding. Other neck: Tracheal and esophageal intubation.  Thyromegaly. Upper chest: Negative Review of the MIP images confirms the above findings CTA HEAD FINDINGS Anterior circulation: Atheromatous irregularity of the left MCA with mild proximal M1 and more moderate bifurcation stenosis. Due to technical limitations there is limited evaluation of branches, with no major branch occlusion seen. Posterior circulation: Left vertebral artery dominance. Most of right vertebral flow is into the right PICA. There is been distal basilar to left proximal PCA stenting. The intrinsic density of the stent limits assessment of in stent patency. There is likely a degree of waisting best seen on precontrast imaging, seen at the level of the mid basilar segment. The stent is patent based on flow seen in the proximal left PCA. There is a unchanged advanced left P2 segment stenosis. Posterior communicating arteries, larger on the right. Venous sinuses: Patent Anatomic variants: None significant Delayed phase: No abnormal intracranial enhancement. These results were called by telephone at the time of interpretation on 08/15/2018 at 9:38 am to Dr. Erlinda Hong, who verbally acknowledged these results. Review of the MIP images confirms the above findings IMPRESSION: 1. Interval  basilar to left proximal PCA stenting. The density of the stent walls precludes detection of in stent stenosis; there is a degree of wasting at the mid basilar  segment. There is flow in the left PCA beyond the stent such that there is presumed stent patency. 2. Known lower pontine infarct. There are new small cerebellar infarcts since brain MRI yesterday. 3. Severe left P2 segment stenosis, also seen previously. 4. Moderate atheromatous narrowing in the proximal left MCA. Electronically Signed   By: Monte Fantasia M.D.   On: 08/15/2018 09:41   Ct Angio Neck W Or Wo Contrast  Result Date: 09/04/2018 CLINICAL DATA:  Code stroke EXAM: CT ANGIOGRAPHY HEAD AND NECK TECHNIQUE: Multidetector CT imaging of the head and neck was performed using the standard protocol during bolus administration of intravenous contrast. Multiplanar CT image reconstructions and MIPs were obtained to evaluate the vascular anatomy. Carotid stenosis measurements (when applicable) are obtained utilizing NASCET criteria, using the distal internal carotid diameter as the denominator. CONTRAST:  96mL ISOVUE-370 IOPAMIDOL (ISOVUE-370) INJECTION 76% COMPARISON:  CT head 09/01/2018 FINDINGS: CTA NECK FINDINGS Aortic arch: Standard branching. Imaged portion shows no evidence of aneurysm or dissection. No significant stenosis of the major arch vessel origins. Right carotid system: Right carotid widely patent without stenosis. Minimal atherosclerotic disease right carotid bifurcation with small calcification carotid bulb Left carotid system: Left carotid widely patent without stenosis. Minimal atherosclerotic calcification left carotid bulb. Vertebral arteries: Left vertebral dominant. Left vertebral artery patent to the basilar without stenosis. Right vertebral is small and ends in PICA most likely a congenital variant. Skeleton: Cervical spondylosis without acute skeletal abnormality. Other neck: 3 cm right thyroid mass with heterogeneous enhancement. No adenopathy in the neck. Upper chest: Lung apices clear bilaterally. Review of the MIP images confirms the above findings CTA HEAD FINDINGS Anterior  circulation: Cavernous carotid widely patent bilaterally without significant stenosis or atherosclerotic disease. Anterior and middle cerebral arteries patent bilaterally. Mild atherosclerotic irregularity left M1 segment. Mild to moderate stenosis left MCA bifurcation. Negative for aneurysm. Posterior circulation: Right vertebral artery ends in PICA. Left vertebral artery supplies the basilar. PICA patent bilaterally. Irregular basilar due to diffuse atherosclerotic disease. Severe focal stenosis in the mid to distal basilar. Superior cerebellar and posterior cerebral arteries patent bilaterally. Atherosclerotic irregularity in the posterior cerebral artery with moderate stenosis right posterior cerebral artery and severe focal stenosis left posterior cerebral artery. Venous sinuses: Patent Anatomic variants: None Delayed phase: Not performed Review of the MIP images confirms the above findings IMPRESSION: 1. 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 No definite acute thrombus identified. 2. Severe stenosis left posterior cerebral artery and moderate stenosis right posterior cerebral artery. 3. Mild stenosis left MCA and moderate stenosis left MCA bifurcation. 4. No significant carotid or vertebral artery stenosis in the neck. 5. These results were called by telephone at the time of interpretation on 08/31/2018 at 08:55 Am to Dr. Rory Percy , who verbally acknowledged these results. Electronically Signed   By: Franchot Gallo M.D.   On: 08/17/2018 09:31   Mr Brain Wo Contrast  Addendum Date: 08/15/2018   ADDENDUM REPORT: 08/15/2018 10:34 ADDENDUM: Omitted impression of left facial/submandibular edema, please correlate with neck exam. Electronically Signed   By: Monte Fantasia M.D.   On: 08/15/2018 10:34   Result Date: 08/15/2018 CLINICAL DATA:  New onset dysarthria with right-sided weakness and facial droop. EXAM: MRI HEAD WITHOUT CONTRAST  TECHNIQUE: Multiplanar, multiecho pulse sequences  of the brain and surrounding structures were obtained without intravenous contrast. COMPARISON:  Yesterday FINDINGS: Brain: Larger bilateral pontine acute infarct, approximately doubled in size. New patchy bilateral cerebellar infarction, greater on the right. Tiny acute infarcts in the left thalamus. Negative for hemorrhage. No hydrocephalus or collection. Chronic small vessel ischemia in the cerebral white matter. Vascular: Major flow voids are preserved, including the basilar which has been stented. Skull and upper cervical spine: Negative for marrow lesion. Sinuses/Orbits: Nasopharyngeal and nasal cavity fluid in the setting of intubation. Nonspecific soft tissue swelling in the left face. IMPRESSION: 1. Progressive acute pontine infarct. New patchy bilateral cerebellar infarction. New tiny left thalamic infarcts. 2. Preserved flow void in the stented basilar. Electronically Signed: By: Monte Fantasia M.D. On: 08/15/2018 10:26   Mr Brain Wo Contrast  Result Date: 08/10/2018 CLINICAL DATA:  Acute onset of slurred speech and right-sided weakness. Vertebrobasilar disease. Abnormal CTA. EXAM: MRI HEAD WITHOUT CONTRAST TECHNIQUE: Multiplanar, multiecho pulse sequences of the brain and surrounding structures were obtained without intravenous contrast. COMPARISON:  None. FINDINGS: Brain: An area acute infarction is present in the left paramidline pons. This does appear to cross the midline, somewhat atypical. The area measures 16 x 11 x 5 mm. Scattered periventricular and subcortical T2 changes bilaterally are moderately advanced for age. Brainstem and cerebellum are otherwise within normal limits. The internal auditory canals are normal. Vascular: Flow is present in the major intracranial arteries. Basilar disease is better visualized on the CTA. Skull and upper cervical spine: Skull base is within normal limits. Soft tissue prominence is noted at the dens. There  is partial erosion of the dens. Question rheumatoid arthritis. Sinuses/Orbits: The paranasal sinuses and mastoid air cells are clear. Globes and orbits are within normal limits. IMPRESSION: 1. Acute nonhemorrhagic infarct involving the left paramedian brainstem. The infarct crosses midline. 2. Other periventricular and subcortical white matter disease is moderately advanced for age. This likely reflects the sequela of chronic microvascular ischemia. 3. Tapering of the dens with prominent soft tissue pannus. 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 Electronically Signed   By: San Morelle M.D.   On: 08/31/2018 11:52   Portable Chest Xray  Result Date: 08/15/2018 CLINICAL DATA:  Follow-up endotracheal tube EXAM: PORTABLE CHEST 1 VIEW COMPARISON:  08/06/2018 FINDINGS: Endotracheal tube and nasogastric catheter are noted in satisfactory position. Cardiac shadow remains enlarged. The lungs are well aerated bilaterally. Mild bibasilar atelectasis is noted. No sizable effusion is seen. No bony abnormality is noted. IMPRESSION: Tubes and lines as described above. Mild bibasilar atelectasis. Electronically Signed   By: Inez Catalina M.D.   On: 08/15/2018 10:10   Portable Chest X-ray  Result Date: 08/13/2018 CLINICAL DATA:  Encounter for intubation EXAM: PORTABLE CHEST 1 VIEW COMPARISON:  09/02/2018 FINDINGS: Endotracheal tube has been placed, tip approximately 3.4 centimeters above the carina. The heart is enlarged. There is mild pulmonary vascular congestion. Question of developing mild perihilar edema versus perihilar infiltrate. The stomach is distended by air. IMPRESSION: Interval placement of endotracheal tube. Question developing perihilar edema or infectious process. Gaseous distension of the stomach. Electronically Signed   By: Nolon Nations M.D.   On: 08/09/2018 22:00   Dg Chest Port 1 View  Result Date: 08/13/2018 CLINICAL DATA:  Respiratory insufficiency, history of hypertension and  diabetes EXAM: PORTABLE CHEST 1 VIEW COMPARISON:  Chest x-ray of 08/13/2016 FINDINGS: The pulmonary vascular congestion appears to have resolved. No pneumonia or effusion is seen. Mediastinal and hilar contours are unchanged and cardiomegaly is  stable. No bony abnormality is seen. IMPRESSION: 1. Resolution of previously noted pulmonary vascular congestion. 2. Stable cardiomegaly. Electronically Signed   By: Ivar Drape M.D.   On: 08/12/2018 17:26   Dg Abd Portable 1v  Result Date: 08/05/2018 CLINICAL DATA:  OG tube placement EXAM: PORTABLE ABDOMEN - 1 VIEW COMPARISON:  CT abdomen and pelvis 01/11/2017 FINDINGS: Enteric tube is present with multiple coils in the left upper quadrant and tip at the midline. Location of the tip is consistent with the distal stomach. Paucity of gas in the abdomen. IMPRESSION: Enteric tube coils in the left upper quadrant with tip in the distal stomach. Electronically Signed   By: Lucienne Capers M.D.   On: 08/22/2018 23:31   Ct Head Code Stroke Wo Contrast  Result Date: 08/09/2018 CLINICAL DATA:  Code stroke. EXAM: CT HEAD WITHOUT CONTRAST TECHNIQUE: Contiguous axial images were obtained from the base of the skull through the vertex without intravenous contrast. COMPARISON:  CT head 08/24/2018 earlier today FINDINGS: Brain: Patchy white matter hypodensity unchanged. No new areas of hypodensity. No acute infarct, hemorrhage, mass. Ventricle size normal. Vascular: Negative for hyperdense vessel Skull: Negative Sinuses/Orbits: Negative Other: None ASPECTS ([place] Stroke Program Early CT Score) - Ganglionic level infarction (caudate, lentiform nuclei, internal capsule, insula, M1-M3 cortex): 7 - Supraganglionic infarction (M4-M6 cortex): 3 Total score (0-10 with 10 being normal): 10 IMPRESSION: 1. No acute abnormality and no change from earlier today 2. ASPECTS is 10 3. These results were called by telephone at the time of interpretation on 08/28/2018 at 8:55 am to Dr. Rory Percy, who  verbally acknowledged these results. Electronically Signed   By: Franchot Gallo M.D.   On: 09/03/2018 08:56    Labs:  CBC: Recent Labs    01/23/18 1352 08/27/2018 2208 08/26/2018 1028 08/15/18 0512  WBC 9.3 8.3 9.4 11.1*  HGB 11.0* 10.8* 11.1* 9.7*  HCT 33.6* 35.7* 36.0 32.4*  PLT 319 409* 418* 409*    COAGS: Recent Labs    08/13/2018 1028  INR 1.02    BMP: Recent Labs    08/24/2018 0317 08/26/2018 1028 08/15/18 0512 08/15/18 1220  NA 140 136 137 137  K 3.4* 3.4* 3.1* 3.4*  CL 106 105 108 111  CO2 22 21* 18* 17*  GLUCOSE 252* 255* 204* 199*  BUN 17 12 13 12   CALCIUM 10.3 10.6* 9.5 9.7  CREATININE 1.15* 1.03* 1.13* 1.04*  GFRNONAA 52* 59* 53* 58*  GFRAA >60 >60 >60 >60    LIVER FUNCTION TESTS: Recent Labs    01/23/18 1352  BILITOT 0.8  AST 14*  ALT 11*  ALKPHOS 92  PROT 7.8  ALBUMIN 3.0*    Assessment and Plan:  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 Estanislado Pandy. Patient's condition stable- can wiggle left toes and bend left knee, no spontaneous movements of right leg or bilateral upper extremities; sustained horizontal nystagmus with right gaze. Right groin incision stable. Continue taking Brilinta 90 mg twice daily and Aspirin 81 mg once daily. Appreciate and agree with neurology management. IR to follow.   Electronically Signed: Earley Abide, PA-C 08/15/2018, 2:34 PM   I spent a total of 15 Minutes at the the patient's bedside AND on the patient's hospital floor or unit, greater than 50% of which was counseling/coordinating care for distal basilar artery stenosis s/p stent assisted angioplasty.

## 2018-08-16 DIAGNOSIS — E876 Hypokalemia: Secondary | ICD-10-CM

## 2018-08-16 LAB — BASIC METABOLIC PANEL
Anion gap: 8 (ref 5–15)
Anion gap: 5 (ref 5–15)
BUN: 17 mg/dL (ref 6–20)
BUN: 20 mg/dL (ref 6–20)
CALCIUM: 9.7 mg/dL (ref 8.9–10.3)
CHLORIDE: 113 mmol/L — AB (ref 98–111)
CO2: 18 mmol/L — AB (ref 22–32)
CO2: 18 mmol/L — AB (ref 22–32)
CREATININE: 1.13 mg/dL — AB (ref 0.44–1.00)
CREATININE: 1.16 mg/dL — AB (ref 0.44–1.00)
Calcium: 9.5 mg/dL (ref 8.9–10.3)
Chloride: 111 mmol/L (ref 98–111)
GFR calc Af Amer: 59 mL/min — ABNORMAL LOW (ref >60)
GFR calc non Af Amer: 53 mL/min — ABNORMAL LOW (ref >60)
GFR calc non Af Amer: 51 mL/min — ABNORMAL LOW (ref >60)
Glucose, Bld: 205 mg/dL — ABNORMAL HIGH (ref 70–99)
Glucose, Bld: 209 mg/dL — ABNORMAL HIGH (ref 70–99)
Potassium: 3.7 mmol/L (ref 3.5–5.1)
Potassium: 3.9 mmol/L (ref 3.5–5.1)
SODIUM: 137 mmol/L (ref 135–145)
SODIUM: 136 mmol/L (ref 135–145)

## 2018-08-16 LAB — CBC
HCT: 31 % — ABNORMAL LOW (ref 36.0–46.0)
Hemoglobin: 9.4 g/dL — ABNORMAL LOW (ref 12.0–15.0)
MCH: 23.7 pg — ABNORMAL LOW (ref 26.0–34.0)
MCHC: 30.3 g/dL (ref 30.0–36.0)
MCV: 78.1 fL — AB (ref 80.0–100.0)
NRBC: 0 % (ref 0.0–0.2)
PLATELETS: 381 10*3/uL (ref 150–400)
RBC: 3.97 MIL/uL (ref 3.87–5.11)
RDW: 15.6 % — AB (ref 11.5–15.5)
WBC: 9.6 10*3/uL (ref 4.0–10.5)

## 2018-08-16 LAB — GLUCOSE, CAPILLARY
GLUCOSE-CAPILLARY: 188 mg/dL — AB (ref 70–99)
GLUCOSE-CAPILLARY: 180 mg/dL — AB (ref 70–99)
GLUCOSE-CAPILLARY: 242 mg/dL — AB (ref 70–99)
Glucose-Capillary: 180 mg/dL — ABNORMAL HIGH (ref 70–99)
Glucose-Capillary: 215 mg/dL — ABNORMAL HIGH (ref 70–99)
Glucose-Capillary: 190 mg/dL — ABNORMAL HIGH (ref 70–99)

## 2018-08-16 LAB — MAGNESIUM
Magnesium: 1.8 mg/dL (ref 1.7–2.4)
Magnesium: 1.6 mg/dL — ABNORMAL LOW (ref 1.7–2.4)

## 2018-08-16 LAB — PHOSPHORUS
PHOSPHORUS: 3.3 mg/dL (ref 2.5–4.6)
Phosphorus: 3.2 mg/dL (ref 2.5–4.6)

## 2018-08-16 MED ORDER — ACETAMINOPHEN 325 MG PO TABS
650.0000 mg | ORAL_TABLET | ORAL | Status: DC | PRN
  Administered 2018-08-16 – 2018-08-29 (×17): 650 mg
  Filled 2018-08-16 (×17): qty 2

## 2018-08-16 MED ORDER — FENTANYL CITRATE (PF) 100 MCG/2ML IJ SOLN
25.0000 ug | INTRAMUSCULAR | Status: DC | PRN
  Administered 2018-08-17 – 2018-08-18 (×5): 25 ug via INTRAVENOUS
  Filled 2018-08-16 (×5): qty 2

## 2018-08-16 NOTE — Progress Notes (Signed)
RT contact Elink Dr Deterding about pt increased WOB and apnea episodes in current ordered mode of CPAP/PS 5/5 30%. MD modified order to CPAP/PS 10/5 30% and to call her back if pt still having issues on the vent. RT will continue to monitor.

## 2018-08-16 NOTE — Progress Notes (Signed)
SW received consult for patient due to family requesting information for applying for SSI. SW obtained SSI resources and provided them to the family. No family was present at bedside, SW left information on counter. SW informed RN, RN agreeable to ensure the family receives information.  Madilyn Fireman, MSW Medical Social Worker [employer] [phone]

## 2018-08-16 NOTE — Progress Notes (Signed)
RT contacted Elink MD Deterding about pt continuing to go apneic on the vent in CPAP/PS mode. MD wants pt back on previous mode 8cc/kg Full Support. RT placed pt back on previous full support mode Per MD. Pt resting more comfortably. RT Will continue to monitor.

## 2018-08-16 NOTE — Evaluation (Addendum)
Physical Therapy Evaluation Patient Details Name: Andrea [name] MRN: [id number] DOB: 1961/07/23 Today's Date: 08/16/2018   History of Present Illness  57 yo presented to ED with chest pain noted Rt hemiparesis in ED with acute Left paramedian brainstem infarct. Intubated 10/10 for angioplasty, extubated post procedure and reintubated. Repeat MRI with brainstem infarct extension and bil cerebellar infarcts. PMHx: HTN, DM, CKD  Clinical Impression  After rounding Dr.Xu stated ok to proceed with P.T. eval at bed level/EOB on vent. Pt supine in bed with diaphoresis throughout session. Pt on vent with ETT, PRVC 30%. Pt with disconjugate gaze with Right eye in superior field throughout, pt with noted nystagmus and able to endorse diplopia with nodding. Pt with only noted movement of LLE and limited trace movement of right hip. Pt stating intact sensation to light touch but unable to fully assess at this time. In chair position in bed attempted to have pt engage core to lean forward but pt unable and required max +2 assist. Pt with above and below deficits with VDRF who will benefit from acute therapy to maximize mobility, safety, strength and function to decrease burden of care. Mom and sister present to provide home setup and PLOF, they were educated and encouraged to perform PROM acutely.   BP 140/72 HR 66 SpO2 100% on vent    Follow Up Recommendations Supervision/Assistance - 24 hour;LTACH    Equipment Recommendations  Hospital bed;Wheelchair (measurements PT)    Recommendations for Other Services       Precautions / Restrictions Precautions Precautions: Fall Precaution Comments: vent, panda Restrictions Weight Bearing Restrictions: No      Mobility  Bed Mobility               General bed mobility comments: utilized foot egress portion of bed to achieve chair position with pt having difficulty controlling neck and increased flexion with HOB lowered to extend  neck  Transfers                 General transfer comment: unable  Ambulation/Gait             General Gait Details: unable  Stairs            Wheelchair Mobility    Modified Rankin (Stroke Patients Only)       Balance                                             Pertinent Vitals/Pain Pain Assessment: No/denies pain    Home Living Family/patient expects to be discharged to:: Private residence Living Arrangements: Children Available Help at Discharge: Family;Available 24 hours/day Type of Home: House Home Access: Stairs to enter   [employer] of Steps: 6 Home Layout: One level Home Equipment: None      Prior Function Level of Independence: Independent               Hand Dominance        Extremity/Trunk Assessment   Upper Extremity Assessment Upper Extremity Assessment: RUE deficits/detail;LUE deficits/detail RUE Deficits / Details: no AROM noted throughout session even with noxious stimuli. Pt reports she can sense touch LUE Deficits / Details: no AROM noted throughout session even with noxious stimuli. Pt reports she can sense touch    Lower Extremity Assessment Lower Extremity Assessment: RLE deficits/detail;LLE deficits/detail RLE Deficits / Details: pt with trace movement of  right hip rotation no other AROM, pt shaking her head that she can sense touch LLE Deficits / Details: 2-/5 knee flexion and extension as well as hip abduction/ adduction. trace movement of toes and dorsiflexion       Communication   Communication: Other (comment)(vent, ETT)  Cognition Arousal/Alertness: Awake/alert Behavior During Therapy: Flat affect Overall Cognitive Status: Difficult to assess                                        General Comments      Exercises General Exercises - Upper Extremity Shoulder Flexion: PROM;Both;Seated(3 reps) Elbow Flexion: PROM;Both;Seated(3 reps) General Exercises -  Lower Extremity Short Arc Quad: AAROM;10 reps;Seated;Left;PROM(PROM x 5 on RLE) Heel Slides: AAROM;10 reps;Seated;Left Hip ABduction/ADduction: AAROM;10 reps;Seated;Left(PROM x 5 on RLE)   Assessment/Plan    PT Assessment Patient needs continued PT services  PT Problem List Decreased strength;Decreased mobility;Decreased safety awareness;Decreased activity tolerance;Decreased cognition;Cardiopulmonary status limiting activity;Decreased balance;Decreased knowledge of use of DME;Impaired sensation;Decreased knowledge of precautions;Decreased range of motion;Obesity       PT Treatment Interventions Functional mobility training;Balance training;Patient/family education;Therapeutic activities;Therapeutic exercise;Cognitive remediation;Neuromuscular re-education    PT Goals (Current goals can be found in the Care Plan section)  Acute Rehab PT Goals Patient Stated Goal: be able to move PT Goal Formulation: With family Time For Goal Achievement: 08/30/18 Potential to Achieve Goals: Fair    Frequency Min 2X/week   Barriers to discharge Decreased caregiver support      Co-evaluation               AM-PAC PT "6 Clicks" Daily Activity  Outcome Measure Difficulty turning over in bed (including adjusting bedclothes, sheets and blankets)?: Unable Difficulty moving from lying on back to sitting on the side of the bed? : Unable Difficulty sitting down on and standing up from a chair with arms (e.g., wheelchair, bedside commode, etc,.)?: Unable Help needed moving to and from a bed to chair (including a wheelchair)?: Total Help needed walking in hospital room?: Total Help needed climbing 3-5 steps with a railing? : Total 6 Click Score: 6    End of Session   Activity Tolerance: Patient tolerated treatment well Patient left: in bed;with call bell/phone within reach;with family/visitor present Nurse Communication: Mobility status;Need for lift equipment PT Visit Diagnosis: Other  abnormalities of gait and mobility (R26.89);Muscle weakness (generalized) (M62.81);Other symptoms and signs involving the nervous system (R29.898);Hemiplegia and hemiparesis Hemiplegia - Right/Left: Right Hemiplegia - dominant/non-dominant: Dominant Hemiplegia - caused by: Cerebral infarction    Time: 3888-2800 PT Time Calculation (min) (ACUTE ONLY): 20 min   Charges:   PT Evaluation $PT Eval High Complexity: 1 High          [place], PT Acute Rehabilitation Services Pager: [phone] Office: [phone]   Shabria Egley B Koty Anctil 08/16/2018, 11:25 AM

## 2018-08-16 NOTE — Progress Notes (Signed)
STROKE TEAM PROGRESS NOTE   SUBJECTIVE (INTERVAL HISTORY) Her RN is at the bedside.  Pt neuro stable overnight. MRI repeat yesterday showed extension of brainstem stroke and new infarcts of b/l cerebellum. This am she is more awake alert following commands moving eyes and closing eyes wiggle toes on the left. But not moving BUE or RLE.    OBJECTIVE Vitals:   08/16/18 0515 08/16/18 0530 08/16/18 0545 08/16/18 0600  BP: (!) 144/77 132/61 136/64 (!) 124/59  Pulse: 73 75 77 73  Resp: 18 19 12 13   Temp:      TempSrc:      SpO2: 100% 100% 100% 100%  Weight:      Height:        CBC:  Recent Labs  Lab 08/23/2018 1028 08/15/18 0512 08/16/18 0500  WBC 9.4 11.1* 9.6  NEUTROABS 5.2 9.1*  --   HGB 11.1* 9.7* 9.4*  HCT 36.0 32.4* 31.0*  MCV 76.9* 77.1* 78.1*  PLT 418* 409* 681    Basic Metabolic Panel:  Recent Labs  Lab 08/15/18 1220 08/15/18 1633 08/16/18 0500  NA 137  --  137  K 3.4*  --  3.7  CL 111  --  111  CO2 17*  --  18*  GLUCOSE 199*  --  205*  BUN 12  --  17  CREATININE 1.04*  --  1.13*  CALCIUM 9.7  --  9.7  MG  --  1.7 1.8  PHOS  --  3.6 3.2    Lipid Panel:     Component Value Date/Time   CHOL 256 (H) 08/15/2018 0512   TRIG 248 (H) 08/15/2018 0512   HDL 35 (L) 08/15/2018 0512   CHOLHDL 7.3 08/15/2018 0512   VLDL 50 (H) 08/15/2018 0512   LDLCALC 171 (H) 08/15/2018 0512   HgbA1c:  Lab Results  Component Value Date   HGBA1C 11.4 (H) 08/15/2018   Urine Drug Screen:     Component Value Date/Time   LABOPIA NONE DETECTED 08/29/2018 1836   COCAINSCRNUR NONE DETECTED 08/10/2018 1836   LABBENZ NONE DETECTED 08/29/2018 1836   AMPHETMU NONE DETECTED 08/31/2018 1836   THCU NONE DETECTED 08/13/2018 1836   LABBARB NONE DETECTED 08/08/2018 1836    Alcohol Level No results found for: ETH  IMAGING  Ct Angio Head W Or Wo Contrast Ct Angio Neck W Or Wo Contrast 08/15/2018 IMPRESSION:  1. Interval basilar to left proximal PCA stenting. The density of the  stent walls precludes detection of in stent stenosis; there is a degree of wasting at the mid basilar segment. There is flow in the left PCA beyond the stent such that there is presumed stent patency.  2. Known lower pontine infarct. There are new small cerebellar infarcts since brain MRI yesterday.  3. Severe left P2 segment stenosis, also seen previously.  4. Moderate atheromatous narrowing in the proximal left MCA.    Ct Angio Head W Or Wo Contrast Ct Angio Neck W Or Wo Contrast 08/30/2018 IMPRESSION:  1. 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 No definite acute thrombus identified.  2. Severe stenosis left posterior cerebral artery and moderate stenosis right posterior cerebral artery.  3. Mild stenosis left MCA and moderate stenosis left MCA bifurcation.  4. No significant carotid or vertebral artery stenosis in the neck.    Ct Head Wo Contrast 09/04/2018 IMPRESSION:  No acute intracranial abnormalities. Mild white matter changes likely due to small vessel  ischemia.    Mr Brain Wo Contrast 08/28/2018 IMPRESSION:  1. Acute nonhemorrhagic infarct involving the left paramedian brainstem. The infarct crosses midline.  2. Other periventricular and subcortical white matter disease is moderately advanced for age. This likely reflects the sequela of chronic microvascular ischemia.  3. Tapering of the dens with prominent soft tissue pannus. 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    Ct Head Code Stroke Wo Contrast 08/16/2018  IMPRESSION:  1. No acute abnormality and no change from earlier today  2. ASPECTS is 10 3.    Portable Chest X-ray 08/25/2018 IMPRESSION:  Interval placement of endotracheal tube. Question developing perihilar edema or infectious process. Gaseous distension of the stomach.   Cerebral Angiogram 08/05/2018 S/P 4 vessel cerebral arteriogram RT CFA approach. Findings  1.Severe  stenosis of distal basilar artery 90 % ,associated with mod to severe ASVD of the mid basilar artery and Lt ANt cerebellar A. S/P stent assisted angioplasty of distal basilar artery with patency of 80 to 90 %. 2.Approx 70 % stenosis of LT ICA supraclinoid seg   MRI 08/15/18 1. Progressive acute pontine infarct. New patchy bilateral cerebellar infarction. New tiny left thalamic infarcts. 2. Preserved flow void in the stented basilar. 3. left facial/submandibular edema, please correlate with neck exam.   TTE - Left ventricle: The cavity size was normal. There was severe   concentric hypertrophy. Systolic function was normal. The   estimated ejection fraction was in the range of 60% to 65%. Wall   motion was normal; there were no regional wall motion   abnormalities. Doppler parameters are consistent with abnormal   left ventricular relaxation (grade 1 diastolic dysfunction). The   E/e&' ratio is <8, suggesting normal LV filling pressure. - Aortic valve: Trileaflet; mildly calcified leaflets.   Transvalvular velocity was minimally increased. There was no   regurgitation. Mean gradient (S): 12 mm Hg. - Mitral valve: Mildly thickened leaflets . There was trivial   regurgitation. - Left atrium: The atrium was normal in size. - Inferior vena cava: The vessel was dilated. The respirophasic   diameter changes were blunted (< 50%), consistent with elevated   central venous pressure. Impressions: - LVEF 60-65%, severe LVH, normal wall motion, grade 1 DD, normal   LV filling pressure, minimally increased aortic velocity without   signficant stenosis, trivial MR, normal LA size, dilated IVC.   PHYSICAL EXAM  Temp:  [98.3 F (36.8 C)-101 F (38.3 C)] 100.5 F (38.1 C) (10/12 0400) Pulse Rate:  [65-98] 73 (10/12 0600) Resp:  [10-31] 13 (10/12 0600) BP: (112-189)/(59-84) 124/59 (10/12 0600) SpO2:  [97 %-100 %] 100 % (10/12 0600) Arterial Line BP: (135-203)/(57-77) 161/57 (10/12  0600) FiO2 (%):  [30 %] 30 % (10/12 0451) Weight:  [129.1 kg] 129.1 kg (10/12 0500)  General - Well nourished, well developed, intubated on low dose fentanyl.  Ophthalmologic - fundi not visualized due to noncooperation.  Cardiovascular - Regular rate and rhythm.  Neuro - awake, alert, eyes open, still intubated, on low dose fentanyl. No ptosis, disconjugate eyes with right eye upward deviation. Right gaze incomplete with rotational nystagmus. PERRL, not consistent blinking to visual threat bilaterally. Corneal, gag and cough present. Facial symmetry not able to test due to ET tube. Tongue in mouth deviated to the right. BUE no spontaneous movement, no withdraw to pain. BLE slight withdraw to pain, able to wiggle left big toe, left positive babinski, right negative babinski. DTR 1+. Sensation not cooperative, coordination and gait not tested.  ASSESSMENT/PLAN Andrea [name] is a 57 y.o. female with history of difficult to control hypertension, insulin-dependent diabetes, obesity, hypothyroidism status post ablation presenting with chest discomfort, right-sided weakness, slurred speech and right facial droop. She did not receive IV t-PA due to late presentation. S/P stent assisted angioplasty of distal basilar artery.  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 Worsening symptoms with extension of pontine/medullary infarcts with new small bilateral cerebellar infarcts without evidence of stent re-stenosis or occlusion  Resultant b// UE and LE weakness, eye disconjugate, nystagmus  CT head - No acute intracranial abnormalities.  CTA H&N 08/12/2018 - Extensive atherosclerotic disease in the basilar with focal critical stenosis in the mid to distal basilar. No definite acute thrombus identified. Severe stenosis left posterior cerebral artery  MRI head - Acute nonhemorrhagic infarct involving the left paramedian brainstem.   CTA H&N  08/15/2018 - stent to dense to see BA lumen, but distal flow preserved. New small cerebellar infarcts since brain MRI yesterday.  Severe left P2 segment stenosis, also seen previously.   Repeat MRI - extension of b/l pontine and upper medullary infarcts with new b/l cerebellar infarcts.  2D Echo - EF 60-65%  LDL - 174  HgbA1c - 11.1  P2Y12 = 35  VTE prophylaxis - heparin subq  Diet - NPO  aspirin 81 mg daily prior to admission, now on Brilinta 90 mg daaily and ASA 81 mg daily.   Patient counseled to be compliant with her antithrombotic medications  Ongoing aggressive stroke risk factor management  Therapy recommendations:  pending  Disposition:  Pending  Intracranial stenosis  BA mid to distal severe stenosis s/p BA stenting  Left PCA severe stenosis, R PCA moderate stenosis  Left MCA moderate stenosis  Uncontrolled stroke risk factors with HLD, HTN, DM  Non compliance with meds at home  Respiratory failure  Intubated  CCM on board  Continue vent support  Hypertension  Stable on cleviprex  Taper off Cleviprex as able  BP goal 130-180   On po norvasc, losartan and metoprolol  D/c A - line  BP go by cuff pressure  Avoid low BP  Hyperlipidemia  Lipid lowering medication PTA:  Lipitor 20 mg daily  LDL 174, goal < 70  increase to 80 mg daily  Continue statin at discharge  Diabetes  HgbA1c 11.4, goal < 7.0  Uncontrolled  Hyperglycemia improving  SSI  CBG monitoring  Other Stroke Risk Factors  Obesity, Body mass index is 48.85 kg/m., recommend weight loss, diet and exercise as appropriate   Other Active Problems  Anemia due to acute blood loss - 11.1->9.7->9.4  Elevated creatinine - 1.04->1.13  Hypokalemia - 3.1 -> supplement->3.7  Leukocytosis - 9.4->11.1->9.6  Hospital day # 2  This patient is critically ill due to worsening brainstem infarct, BA stenosis s/p stenting,  and at significant risk of neurological worsening,  death form recurrent stroke, hemorrhagic conversion, CHF, seizure, aspiration. This patient's care requires constant monitoring of vital signs, hemodynamics, respiratory and cardiac monitoring, review of multiple databases, neurological assessment, discussion with family, other specialists and medical decision making of high complexity. I spent 35 minutes of neurocritical care time in the care of this patient.    Andrea Hawking, MD PhD Stroke Neurology 08/16/2018 9:50 AM    To contact Stroke Continuity provider, please refer to [email or web address] After hours, contact General Neurology

## 2018-08-16 NOTE — Consult Note (Signed)
NAME:  Andrea [name], MRN:  [id number], DOB:  1961/03/06, LOS: 2 ADMISSION DATE:  09/04/2018, CONSULTATION DATE:  08/13/2018 REFERRING MD:  Dr. Rory Percy, CHIEF COMPLAINT:  CP/ Acute CVA  Brief History   63 yoF w/poorly controlled HTN and IDDM presenting with CP and left arm pain w/BP 220/117. Initial CTH neg.  Cardiac workup negative for acute event thus far, cardiology following.  Code stroke initiated for R hemiplegia, dysarthria and right facial droop.  Out of window for TPA.  Requiring cleviprex for BP control.  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  Evolving pontine stroke on MRI. Intubated for neuro IR s/p stenting and angioplasty with 80-90% patency.  Extubated post procedure but due to insufficency placed on BiPAP and returns to ICU.    Past Medical History  HTN, IDDM, hypothyroidism s/p ablation  Significant Hospital Events   10/9 present to [facility] ED -transferred to [facility] 10/9 basilar artery stenting 10/10 CTA for declining exam - evolving pontine infarct, stent deemed to be patent  Consults: date of consult/date signed off & final recs:  Cards 10/10 Neurology 10/10 PCCM 10/10  Procedures (surgical and bedside):  10/10 Neuro IR >> s/p stent and angioplasty of distal basilar artery  10/10 ETT for procedure 10/10 L R art line >>  Significant Diagnostic Tests:  09/04/2018  CTH >>  No acute intracranial abnormalities. Mild white matter changes likely due to small vessel ischemia  08/1018 CTH >> 1. No acute abnormality and no change from earlier today 2. ASPECTS is 10  08/15/2018  CTA head and neck >>  1. 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 No definite acute thrombus identified. 2. Severe stenosis left posterior cerebral artery and moderate stenosis right posterior cerebral artery. 3. Mild stenosis left MCA and moderate stenosis left MCA bifurcation. 4. No significant carotid or  vertebral artery stenosis in the neck.  08/18/2018 MRI - evolving pontine infarct.  08/15/18  Echocardiogram - normal LVSF with severe LVH.   Micro Data:  09/03/2018 MRSA PCR >>negative  Antimicrobials:  10/10 cefazolin preop  Subjective:  Worked with physiotherapy today. Following commands in LLE.  Objective   Blood pressure 123/69, pulse 66, temperature 99.2 F (37.3 C), temperature source Axillary, resp. rate 16, height 5\' 4"  (1.626 m), weight 129.1 kg, last menstrual period 07/26/2018, SpO2 99 %.    Vent Mode: PRVC FiO2 (%):  [30 %] 30 % Set Rate:  [15 bmp] 15 bmp Vt Set:  [430 mL] 430 mL PEEP:  [5 cmH20] 5 cmH20 Plateau Pressure:  [8 cmH20-12 cmH20] 12 cmH20   Intake/Output Summary (Last 24 hours) at 08/16/2018 1456 Last data filed at 08/16/2018 1200 Gross per 24 hour  Intake 2695.78 ml  Output 500 ml  Net 2195.78 ml   Filed Weights   08/09/2018 1430 08/15/18 0515 08/16/18 0500  Weight: 128.3 kg 123.2 kg 129.1 kg    Examination: General: Well-nourished adult woman. HEENT: Tracheal and orogastric tubes in place with no skin breakdown. Neuro: Opens eyes to command and tracks to voice.  Left gaze nystagmus resolved.  Now following commands in the LLE . On fentanyl infusion. PULM: Mechanically ventilated with occasional asynchrony. Tolerating PSV 5/5. CV: Remains warm and well-perfused heart sounds are unremarkable.  Cleveprex gtts now off. GI: obese, soft, non-tender, bs active.  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  Has lost ability to follow commands in her limbs.  At risk for further edema over next 24 to 48 hours. Continue strict blood pressure control to limit risk of hemorrhagic conversion. Aspirin and Brilinta for stent patency Maintain normal glycemia. Keep sodium greater than 145. Stop fentanyl infusion and use  prn only.  Respiratory Insufficiency due to inability to protect airway from dysarthria.  Expected with location of stroke. Tolerating PSV more comfortably. Will consider early tracheostomy once patient is through the acute phase of stroke as swallowing dysfunction likely to persist.  Hypertensive Crisis - poorly controlled resistant hypertension Now well controled on enteral agents. Cleveprex off.  Microcytic Anemia Trend CBC Transfuse per protocol   Chest pain of unclear etiology.  Likely on noncardiac given negative troponin - no pain at present Cardiology has signed off.  Type 2 diabetes now on insulin Tight blood pressure control to prevent cerebral swelling  Hypothyroidism  - TSH 1.338 Resume oral Synthroid  Disposition / Summary of Today's Plan 08/16/18   Currently entering peak swelling window at risk for significant decompensation given location of the stroke.  Avoid hyponatremia and hyperglycemia.  Maintain adequate cerebral perfusion pressure.    Diet: Tube feeds initiated Pain/Anxiety/Delirium protocol (if indicated): N/a VAP protocol (if indicated): no DVT prophylaxis: SCDs only -we will reassess chemical prophylaxis after 48 hours  GI prophylaxis: Famotidine Hyperglycemia protocol: SSI Mobility: bedrest Code Status: Full  Family Communication: Patient updated.   Labs ( personally  Reviewed)  CBC: Recent Labs  Lab 08/12/2018 2208 09/02/2018 1028 08/15/18 0512 08/16/18 0500  WBC 8.3 9.4 11.1* 9.6  NEUTROABS  --  5.2 9.1*  --   HGB 10.8* 11.1* 9.7* 9.4*  HCT 35.7* 36.0 32.4* 31.0*  MCV 78.5* 76.9* 77.1* 78.1*  PLT 409* 418* 409* 944    Basic Metabolic Panel: Recent Labs  Lab 09/03/2018 0317 08/21/2018 1028 08/10/2018 2015 08/15/18 0512 08/15/18 1220 08/15/18 1633 08/16/18 0500  NA 140 136  --  137 137  --  137  K 3.4* 3.4*  --  3.1* 3.4*  --  3.7  CL 106 105  --  108 111  --  111  CO2 22 21*  --  18* 17*  --  18*  GLUCOSE 252* 255*  --  204* 199*   --  205*  BUN 17 12  --  13 12  --  17  CREATININE 1.15* 1.03*  --  1.13* 1.04*  --  1.13*  CALCIUM 10.3 10.6*  --  9.5 9.7  --  9.7  MG  --   --  1.4*  --   --  1.7 1.8  PHOS  --   --  3.3  --   --  3.6 3.2   GFR: Estimated Creatinine Clearance: 73.3 mL/min (A) (by C-G formula based on SCr of 1.13 mg/dL (H)). Recent Labs  Lab 08/19/2018 2208 08/30/2018 1028 08/15/18 0512 08/16/18 0500  WBC 8.3 9.4 11.1* 9.6    Liver Function Tests: No results for input(s): AST, ALT, ALKPHOS, BILITOT, PROT, ALBUMIN in the last 168 hours. No results for input(s): LIPASE, AMYLASE in the last 168 hours. No results for input(s): AMMONIA in the last 168 hours.  ABG    Component Value Date/Time   PHART 7.334 (L) 08/12/2018 2259   PCO2ART 41.9 08/18/2018 2259   PO2ART 275.0 (H) 08/30/2018 2259   HCO3 22.3 08/05/2018 2259   TCO2 24  08/31/2018 2259   ACIDBASEDEF 3.0 (H) 08/13/2018 2259   O2SAT 100.0 08/21/2018 2259     Coagulation Profile: Recent Labs  Lab 08/26/2018 1028  INR 1.02    Cardiac Enzymes: Recent Labs  Lab 08/11/2018 0317 08/13/2018 2015  TROPONINI 0.03* <0.03    HbA1C: Hgb A1c MFr Bld  Date/Time Value Ref Range Status  08/15/2018 05:12 AM 11.4 (H) 4.8 - 5.6 % Final    Comment:    (NOTE) Pre diabetes:          5.7%-6.4% Diabetes:              >6.4% Glycemic control for   <7.0% adults with diabetes   01/12/2017 02:43 AM 12.8 (H) 4.8 - 5.6 % Final    Comment:    (NOTE)         Pre-diabetes: 5.7 - 6.4         Diabetes: >6.4         Glycemic control for adults with diabetes: <7.0     CBG: Recent Labs  Lab 08/15/18 1945 08/15/18 2309 08/16/18 0317 08/16/18 0821 08/16/18 1205  GLUCAP 168* 195* 188* 180* 215*   CRITICAL CARE Performed by: Kipp Brood   Total critical care time: 35 minutes  Critical care time was exclusive of separately billable procedures and treating other patients.  Critical care was necessary to treat or prevent imminent or  life-threatening deterioration.  Critical care was time spent personally by me on the following activities: development of treatment plan with patient and/or surrogate as well as nursing, discussions with consultants, evaluation of patient's response to treatment, examination of patient, obtaining history from patient or surrogate, ordering and performing treatments and interventions, ordering and review of laboratory studies, ordering and review of radiographic studies, pulse oximetry and re-evaluation of patient's condition.  Kipp Brood, MD [facility] ICU Physician [place]  Pager: [phone] Mobile: [phone] After hours: [phone].   08/16/2018, 2:56 PM

## 2018-08-16 NOTE — Progress Notes (Addendum)
Referring Physician(s): Dr. Rory Percy  Supervising Physician: Luanne Bras  Patient Status:  [facility] - In-pt  Chief Complaint: Paramedian left pontine infarct, basilar artery stenosis s/p stent-assisted angioplasty.   Subjective: Remains intubated, but alert and appears to be responsive.  Still with little movement in extremities.  Family at bedside.   Allergies: Tramadol  Medications: Prior to Admission medications   Medication Sig Start Date End Date Taking? Authorizing Provider  amLODipine (NORVASC) 10 MG tablet Take 1 tablet (10 mg total) by mouth daily. 05/16/18  Yes Elsie Stain, MD  aspirin EC 81 MG tablet Take 81 mg by mouth daily.   Yes [provider]  atorvastatin (LIPITOR) 20 MG tablet Take 1 tablet (20 mg total) by mouth daily. 01/29/18  Yes Verner Mould, MD  benzonatate (TESSALON) 100 MG capsule Take 1 capsule (100 mg total) by mouth every 8 (eight) hours. 12/16/17  Yes Maczis, Barth Kirks, PA-C  cloNIDine (CATAPRES) 0.2 MG tablet Take 1.5 tablets (0.3 mg total) by mouth 2 (two) times daily. 05/16/18  Yes Elsie Stain, MD  ferrous sulfate 325 (65 FE) MG EC tablet Take 1 tablet (325 mg total) by mouth 3 (three) times daily with meals. 01/17/17  Yes Hensel, Jamal Collin, MD  gabapentin (NEURONTIN) 300 MG capsule Take 1 capsule (300 mg total) by mouth 3 (three) times daily. Patient taking differently: Take 300 mg by mouth 3 (three) times daily as needed (nerve pain).  12/26/16  Yes Lysbeth Penner, FNP  hydrochlorothiazide (HYDRODIURIL) 25 MG tablet TAKE 1 TABLET (25 MG TOTAL) BY MOUTH DAILY. 05/16/18  Yes Elsie Stain, MD  ibuprofen (ADVIL,MOTRIN) 800 MG tablet Take 1 tablet (800 mg total) by mouth every 8 (eight) hours as needed. Patient taking differently: Take 800 mg by mouth every 8 (eight) hours as needed for headache or mild pain.  01/19/17  Yes Lawyer, Harrell Gave, PA-C  Insulin Glargine (LANTUS SOLOSTAR) 100 UNIT/ML Solostar Pen INJECT  27 UNITS INTO THE SKIN AT BEDTIME. 05/16/18  Yes Elsie Stain, MD  losartan (COZAAR) 25 MG tablet Take 1 tablet (25 mg total) by mouth daily. 05/16/18  Yes Elsie Stain, MD  megestrol (MEGACE) 40 MG tablet TAKE 2 TABLETS BY MOUTH TWICE A DAY , MAY INCREASE TO 3 TABLETS TWICE DAILY FOR HEAVY BLEEDING Patient taking differently: Take 80 mg by mouth 2 (two) times daily. May increase to 3 tablets twice daily for heavy bleeding 11/26/17  Yes Anyanwu, Sallyanne Havers, MD  metFORMIN (GLUCOPHAGE) 1000 MG tablet Take 1 tablet (1,000 mg total) by mouth 2 (two) times daily with a meal. 05/16/18  Yes Elsie Stain, MD  metoprolol succinate (TOPROL XL) 50 MG 24 hr tablet Take 1 tablet (50 mg total) by mouth daily. Take with or immediately following a meal. 05/16/18  Yes Elsie Stain, MD  VOLTAREN 1 % GEL APPLY 2 G TOPICALLY 4 (FOUR) TIMES DAILY. Patient taking differently: Apply 2 g topically 4 (four) times daily as needed (pain).  01/16/17  Yes Bacigalupo, Dionne Bucy, MD  Insulin Pen Needle (B-D ULTRAFINE III SHORT PEN) 31G X 8 MM MISC USE AS DIRECTED TO INJECT INSULIN AT BEDTIME 01/15/18   Verner Mould, MD  Insulin Syringe-Needle U-100 (INSULIN SYRINGE .5CC/30GX1/2") 30G X 1/2" 0.5 ML MISC Use to inject insulin daily as prescribed 01/23/17   Virginia Crews, MD  sertraline (ZOLOFT) 50 MG tablet Take 50 mg by mouth daily.  01/19/12  [provider]  Vital Signs: BP (!) 147/78   Pulse 69   Temp (!) 100.6 F (38.1 C) (Axillary)   Resp 19   Ht 5\' 4"  (1.626 m)   Wt 284 lb 9.8 oz (129.1 kg)   LMP 07/26/2018   SpO2 100%   BMI 48.85 kg/m   Physical Exam  Nursing note and vitals reviewed. Remains alert but intubated.   Able to respond on vent with prompts Neuro: Intubated but not sedated PERRL bilaterally. No facial asymmetry. No movement of bilateral upper or lower extremities. No movement in RLE.  PV: Distal pulses intact 2+ bilaterally Groin: Intact.  Imaging: Ct  Angio Head W Or Wo Contrast  Result Date: 08/15/2018 CLINICAL DATA:  Stroke follow-up EXAM: CT ANGIOGRAPHY HEAD AND NECK TECHNIQUE: Multidetector CT imaging of the head and neck was performed using the standard protocol during bolus administration of intravenous contrast. Multiplanar CT image reconstructions and MIPs were obtained to evaluate the vascular anatomy. Carotid stenosis measurements (when applicable) are obtained utilizing NASCET criteria, using the distal internal carotid diameter as the denominator. CONTRAST:  41mL ISOVUE-370 IOPAMIDOL (ISOVUE-370) INJECTION 76% COMPARISON:  CTA of the head neck from yesterday FINDINGS: CT HEAD FINDINGS Brain: Known infarct in the lower pons. There is patchy right more than left superior cerebellar infarctions not seen on prior brain MRI. Chronic small vessel ischemic change in the cerebral white matter. No hemorrhage, hydrocephalus, or mass effect. Vascular: See below. Skull: No acute finding Sinuses: Negative Orbits: Negative Review of the MIP images confirms the above findings CTA NECK FINDINGS Aortic arch: Partial coverage is negative.  Three vessel branching. Right carotid system: Mild atherosclerotic calcification. No stenosis or ulceration. Left carotid system: Mild atherosclerotic calcification. No stenosis or ulceration. Vertebral arteries: No proximal subclavian stenosis. Left dominant vertebral artery. Both vertebral arteries are widely patent to the dura when allowing for artifact related to bolus density and patient size. Skeleton: No acute or aggressive finding. Other neck: Tracheal and esophageal intubation.  Thyromegaly. Upper chest: Negative Review of the MIP images confirms the above findings CTA HEAD FINDINGS Anterior circulation: Atheromatous irregularity of the left MCA with mild proximal M1 and more moderate bifurcation stenosis. Due to technical limitations there is limited evaluation of branches, with no major branch occlusion seen. Posterior  circulation: Left vertebral artery dominance. Most of right vertebral flow is into the right PICA. There is been distal basilar to left proximal PCA stenting. The intrinsic density of the stent limits assessment of in stent patency. There is likely a degree of waisting best seen on precontrast imaging, seen at the level of the mid basilar segment. The stent is patent based on flow seen in the proximal left PCA. There is a unchanged advanced left P2 segment stenosis. Posterior communicating arteries, larger on the right. Venous sinuses: Patent Anatomic variants: None significant Delayed phase: No abnormal intracranial enhancement. These results were called by telephone at the time of interpretation on 08/15/2018 at 9:38 am to Dr. Erlinda Hong, who verbally acknowledged these results. Review of the MIP images confirms the above findings IMPRESSION: 1. Interval basilar to left proximal PCA stenting. The density of the stent walls precludes detection of in stent stenosis; there is a degree of wasting at the mid basilar segment. There is flow in the left PCA beyond the stent such that there is presumed stent patency. 2. Known lower pontine infarct. There are new small cerebellar infarcts since brain MRI yesterday. 3. Severe left P2 segment stenosis, also seen previously. 4. Moderate atheromatous narrowing  in the proximal left MCA. Electronically Signed   By: Monte Fantasia M.D.   On: 08/15/2018 09:41   Ct Angio Head W Or Wo Contrast  Result Date: 08/11/2018 CLINICAL DATA:  Code stroke EXAM: CT ANGIOGRAPHY HEAD AND NECK TECHNIQUE: Multidetector CT imaging of the head and neck was performed using the standard protocol during bolus administration of intravenous contrast. Multiplanar CT image reconstructions and MIPs were obtained to evaluate the vascular anatomy. Carotid stenosis measurements (when applicable) are obtained utilizing NASCET criteria, using the distal internal carotid diameter as the denominator. CONTRAST:  22mL  ISOVUE-370 IOPAMIDOL (ISOVUE-370) INJECTION 76% COMPARISON:  CT head 08/29/2018 FINDINGS: CTA NECK FINDINGS Aortic arch: Standard branching. Imaged portion shows no evidence of aneurysm or dissection. No significant stenosis of the major arch vessel origins. Right carotid system: Right carotid widely patent without stenosis. Minimal atherosclerotic disease right carotid bifurcation with small calcification carotid bulb Left carotid system: Left carotid widely patent without stenosis. Minimal atherosclerotic calcification left carotid bulb. Vertebral arteries: Left vertebral dominant. Left vertebral artery patent to the basilar without stenosis. Right vertebral is small and ends in PICA most likely a congenital variant. Skeleton: Cervical spondylosis without acute skeletal abnormality. Other neck: 3 cm right thyroid mass with heterogeneous enhancement. No adenopathy in the neck. Upper chest: Lung apices clear bilaterally. Review of the MIP images confirms the above findings CTA HEAD FINDINGS Anterior circulation: Cavernous carotid widely patent bilaterally without significant stenosis or atherosclerotic disease. Anterior and middle cerebral arteries patent bilaterally. Mild atherosclerotic irregularity left M1 segment. Mild to moderate stenosis left MCA bifurcation. Negative for aneurysm. Posterior circulation: Right vertebral artery ends in PICA. Left vertebral artery supplies the basilar. PICA patent bilaterally. Irregular basilar due to diffuse atherosclerotic disease. Severe focal stenosis in the mid to distal basilar. Superior cerebellar and posterior cerebral arteries patent bilaterally. Atherosclerotic irregularity in the posterior cerebral artery with moderate stenosis right posterior cerebral artery and severe focal stenosis left posterior cerebral artery. Venous sinuses: Patent Anatomic variants: None Delayed phase: Not performed Review of the MIP images confirms the above findings IMPRESSION: 1. Extensive  atherosclerotic disease in the basilar with focal critical stenosis in the mid to distal basilar. No definite acute thrombus identified. 2. Severe stenosis left posterior cerebral artery and moderate stenosis right posterior cerebral artery. 3. Mild stenosis left MCA and moderate stenosis left MCA bifurcation. 4. No significant carotid or vertebral artery stenosis in the neck. 5. These results were called by telephone at the time of interpretation on 08/25/2018 at 08:55 Am to Dr. Rory Percy , who verbally acknowledged these results. Electronically Signed   By: Franchot Gallo M.D.   On: 08/11/2018 09:31   Dg Chest 2 View  Result Date: 08/13/2018 CLINICAL DATA:  Chest pain starting yesterday. EXAM: CHEST - 2 VIEW COMPARISON:  12/16/2017 FINDINGS: Stable cardiomegaly with atherosclerotic, slightly ectatic thoracic aorta. Central pulmonary vascular congestion is noted without pulmonary consolidation. No effusion or pneumothorax. Thoracic spondylosis is noted. No acute osseous abnormality is seen. IMPRESSION: Cardiomegaly with mild central vascular congestion. Electronically Signed   By: Ashley Royalty M.D.   On: 08/13/2018 22:49   Ct Head Wo Contrast  Result Date: 08/10/2018 CLINICAL DATA:  Funny feeling in the left arm. Slurred speech. History of hypertension and diabetes. EXAM: CT HEAD WITHOUT CONTRAST TECHNIQUE: Contiguous axial images were obtained from the base of the skull through the vertex without intravenous contrast. COMPARISON:  None. FINDINGS: Brain: No evidence of acute infarction, hemorrhage, hydrocephalus, extra-axial collection or mass lesion/mass  effect. Patchy white matter low attenuation changes likely represent early small vessel ischemic change. Vascular: No hyperdense vessel or unexpected calcification. Skull: Normal. Negative for fracture or focal lesion. Sinuses/Orbits: Paranasal sinuses and mastoid air cells are clear. Other: None. IMPRESSION: No acute intracranial abnormalities. Mild white  matter changes likely due to small vessel ischemia. Electronically Signed   By: Lucienne Capers M.D.   On: 08/26/2018 03:33   Ct Angio Neck W Or Wo Contrast  Result Date: 08/15/2018 CLINICAL DATA:  Stroke follow-up EXAM: CT ANGIOGRAPHY HEAD AND NECK TECHNIQUE: Multidetector CT imaging of the head and neck was performed using the standard protocol during bolus administration of intravenous contrast. Multiplanar CT image reconstructions and MIPs were obtained to evaluate the vascular anatomy. Carotid stenosis measurements (when applicable) are obtained utilizing NASCET criteria, using the distal internal carotid diameter as the denominator. CONTRAST:  92mL ISOVUE-370 IOPAMIDOL (ISOVUE-370) INJECTION 76% COMPARISON:  CTA of the head neck from yesterday FINDINGS: CT HEAD FINDINGS Brain: Known infarct in the lower pons. There is patchy right more than left superior cerebellar infarctions not seen on prior brain MRI. Chronic small vessel ischemic change in the cerebral white matter. No hemorrhage, hydrocephalus, or mass effect. Vascular: See below. Skull: No acute finding Sinuses: Negative Orbits: Negative Review of the MIP images confirms the above findings CTA NECK FINDINGS Aortic arch: Partial coverage is negative.  Three vessel branching. Right carotid system: Mild atherosclerotic calcification. No stenosis or ulceration. Left carotid system: Mild atherosclerotic calcification. No stenosis or ulceration. Vertebral arteries: No proximal subclavian stenosis. Left dominant vertebral artery. Both vertebral arteries are widely patent to the dura when allowing for artifact related to bolus density and patient size. Skeleton: No acute or aggressive finding. Other neck: Tracheal and esophageal intubation.  Thyromegaly. Upper chest: Negative Review of the MIP images confirms the above findings CTA HEAD FINDINGS Anterior circulation: Atheromatous irregularity of the left MCA with mild proximal M1 and more moderate  bifurcation stenosis. Due to technical limitations there is limited evaluation of branches, with no major branch occlusion seen. Posterior circulation: Left vertebral artery dominance. Most of right vertebral flow is into the right PICA. There is been distal basilar to left proximal PCA stenting. The intrinsic density of the stent limits assessment of in stent patency. There is likely a degree of waisting best seen on precontrast imaging, seen at the level of the mid basilar segment. The stent is patent based on flow seen in the proximal left PCA. There is a unchanged advanced left P2 segment stenosis. Posterior communicating arteries, larger on the right. Venous sinuses: Patent Anatomic variants: None significant Delayed phase: No abnormal intracranial enhancement. These results were called by telephone at the time of interpretation on 08/15/2018 at 9:38 am to Dr. Erlinda Hong, who verbally acknowledged these results. Review of the MIP images confirms the above findings IMPRESSION: 1. Interval basilar to left proximal PCA stenting. The density of the stent walls precludes detection of in stent stenosis; there is a degree of wasting at the mid basilar segment. There is flow in the left PCA beyond the stent such that there is presumed stent patency. 2. Known lower pontine infarct. There are new small cerebellar infarcts since brain MRI yesterday. 3. Severe left P2 segment stenosis, also seen previously. 4. Moderate atheromatous narrowing in the proximal left MCA. Electronically Signed   By: Monte Fantasia M.D.   On: 08/15/2018 09:41   Ct Angio Neck W Or Wo Contrast  Result Date: 08/22/2018 CLINICAL DATA:  Code  stroke EXAM: CT ANGIOGRAPHY HEAD AND NECK TECHNIQUE: Multidetector CT imaging of the head and neck was performed using the standard protocol during bolus administration of intravenous contrast. Multiplanar CT image reconstructions and MIPs were obtained to evaluate the vascular anatomy. Carotid stenosis  measurements (when applicable) are obtained utilizing NASCET criteria, using the distal internal carotid diameter as the denominator. CONTRAST:  50mL ISOVUE-370 IOPAMIDOL (ISOVUE-370) INJECTION 76% COMPARISON:  CT head 08/30/2018 FINDINGS: CTA NECK FINDINGS Aortic arch: Standard branching. Imaged portion shows no evidence of aneurysm or dissection. No significant stenosis of the major arch vessel origins. Right carotid system: Right carotid widely patent without stenosis. Minimal atherosclerotic disease right carotid bifurcation with small calcification carotid bulb Left carotid system: Left carotid widely patent without stenosis. Minimal atherosclerotic calcification left carotid bulb. Vertebral arteries: Left vertebral dominant. Left vertebral artery patent to the basilar without stenosis. Right vertebral is small and ends in PICA most likely a congenital variant. Skeleton: Cervical spondylosis without acute skeletal abnormality. Other neck: 3 cm right thyroid mass with heterogeneous enhancement. No adenopathy in the neck. Upper chest: Lung apices clear bilaterally. Review of the MIP images confirms the above findings CTA HEAD FINDINGS Anterior circulation: Cavernous carotid widely patent bilaterally without significant stenosis or atherosclerotic disease. Anterior and middle cerebral arteries patent bilaterally. Mild atherosclerotic irregularity left M1 segment. Mild to moderate stenosis left MCA bifurcation. Negative for aneurysm. Posterior circulation: Right vertebral artery ends in PICA. Left vertebral artery supplies the basilar. PICA patent bilaterally. Irregular basilar due to diffuse atherosclerotic disease. Severe focal stenosis in the mid to distal basilar. Superior cerebellar and posterior cerebral arteries patent bilaterally. Atherosclerotic irregularity in the posterior cerebral artery with moderate stenosis right posterior cerebral artery and severe focal stenosis left posterior cerebral artery.  Venous sinuses: Patent Anatomic variants: None Delayed phase: Not performed Review of the MIP images confirms the above findings IMPRESSION: 1. 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 No definite acute thrombus identified. 2. Severe stenosis left posterior cerebral artery and moderate stenosis right posterior cerebral artery. 3. Mild stenosis left MCA and moderate stenosis left MCA bifurcation. 4. No significant carotid or vertebral artery stenosis in the neck. 5. These results were called by telephone at the time of interpretation on 08/24/2018 at 08:55 Am to Dr. Rory Percy , who verbally acknowledged these results. Electronically Signed   By: Franchot Gallo M.D.   On: 08/11/2018 09:31   Mr Brain Wo Contrast  Addendum Date: 08/15/2018   ADDENDUM REPORT: 08/15/2018 10:34 ADDENDUM: Omitted impression of left facial/submandibular edema, please correlate with neck exam. Electronically Signed   By: Monte Fantasia M.D.   On: 08/15/2018 10:34   Result Date: 08/15/2018 CLINICAL DATA:  New onset dysarthria with right-sided weakness and facial droop. EXAM: MRI HEAD WITHOUT CONTRAST TECHNIQUE: Multiplanar, multiecho pulse sequences of the brain and surrounding structures were obtained without intravenous contrast. COMPARISON:  Yesterday FINDINGS: Brain: Larger bilateral pontine acute infarct, approximately doubled in size. New patchy bilateral cerebellar infarction, greater on the right. Tiny acute infarcts in the left thalamus. Negative for hemorrhage. No hydrocephalus or collection. Chronic small vessel ischemia in the cerebral white matter. Vascular: Major flow voids are preserved, including the basilar which has been stented. Skull and upper cervical spine: Negative for marrow lesion. Sinuses/Orbits: Nasopharyngeal and nasal cavity fluid in the setting of intubation. Nonspecific soft tissue swelling in the left face. IMPRESSION: 1. Progressive acute  pontine infarct. New patchy bilateral cerebellar infarction. New  tiny left thalamic infarcts. 2. Preserved flow void in the stented basilar. Electronically Signed: By: Monte Fantasia M.D. On: 08/15/2018 10:26   Mr Brain Wo Contrast  Result Date: 08/09/2018 CLINICAL DATA:  Acute onset of slurred speech and right-sided weakness. Vertebrobasilar disease. Abnormal CTA. EXAM: MRI HEAD WITHOUT CONTRAST TECHNIQUE: Multiplanar, multiecho pulse sequences of the brain and surrounding structures were obtained without intravenous contrast. COMPARISON:  None. FINDINGS: Brain: An area acute infarction is present in the left paramidline pons. This does appear to cross the midline, somewhat atypical. The area measures 16 x 11 x 5 mm. Scattered periventricular and subcortical T2 changes bilaterally are moderately advanced for age. Brainstem and cerebellum are otherwise within normal limits. The internal auditory canals are normal. Vascular: Flow is present in the major intracranial arteries. Basilar disease is better visualized on the CTA. Skull and upper cervical spine: Skull base is within normal limits. Soft tissue prominence is noted at the dens. There is partial erosion of the dens. Question rheumatoid arthritis. Sinuses/Orbits: The paranasal sinuses and mastoid air cells are clear. Globes and orbits are within normal limits. IMPRESSION: 1. Acute nonhemorrhagic infarct involving the left paramedian brainstem. The infarct crosses midline. 2. Other periventricular and subcortical white matter disease is moderately advanced for age. This likely reflects the sequela of chronic microvascular ischemia. 3. Tapering of the dens with prominent soft tissue pannus. 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 Electronically Signed   By: San Morelle M.D.   On: 08/12/2018 11:52   Portable Chest Xray  Result Date: 08/15/2018 CLINICAL DATA:  Follow-up endotracheal tube EXAM: PORTABLE CHEST 1 VIEW COMPARISON:  08/19/2018  FINDINGS: Endotracheal tube and nasogastric catheter are noted in satisfactory position. Cardiac shadow remains enlarged. The lungs are well aerated bilaterally. Mild bibasilar atelectasis is noted. No sizable effusion is seen. No bony abnormality is noted. IMPRESSION: Tubes and lines as described above. Mild bibasilar atelectasis. Electronically Signed   By: Inez Catalina M.D.   On: 08/15/2018 10:10   Portable Chest X-ray  Result Date: 09/01/2018 CLINICAL DATA:  Encounter for intubation EXAM: PORTABLE CHEST 1 VIEW COMPARISON:  08/28/2018 FINDINGS: Endotracheal tube has been placed, tip approximately 3.4 centimeters above the carina. The heart is enlarged. There is mild pulmonary vascular congestion. Question of developing mild perihilar edema versus perihilar infiltrate. The stomach is distended by air. IMPRESSION: Interval placement of endotracheal tube. Question developing perihilar edema or infectious process. Gaseous distension of the stomach. Electronically Signed   By: Nolon Nations M.D.   On: 08/25/2018 22:00   Dg Chest Port 1 View  Result Date: 08/15/2018 CLINICAL DATA:  Respiratory insufficiency, history of hypertension and diabetes EXAM: PORTABLE CHEST 1 VIEW COMPARISON:  Chest x-ray of 08/13/2016 FINDINGS: The pulmonary vascular congestion appears to have resolved. No pneumonia or effusion is seen. Mediastinal and hilar contours are unchanged and cardiomegaly is stable. No bony abnormality is seen. IMPRESSION: 1. Resolution of previously noted pulmonary vascular congestion. 2. Stable cardiomegaly. Electronically Signed   By: Ivar Drape M.D.   On: 08/17/2018 17:26   Dg Abd Portable 1v  Result Date: 09/03/2018 CLINICAL DATA:  OG tube placement EXAM: PORTABLE ABDOMEN - 1 VIEW COMPARISON:  CT abdomen and pelvis 01/11/2017 FINDINGS: Enteric tube is present with multiple coils in the left upper quadrant and tip at the midline. Location of the tip is consistent with the distal stomach. Paucity  of gas in the abdomen. IMPRESSION: Enteric tube coils in the left upper quadrant with tip in the distal stomach.  Electronically Signed   By: Lucienne Capers M.D.   On: 08/22/2018 23:31   Ct Head Code Stroke Wo Contrast  Result Date: 08/11/2018 CLINICAL DATA:  Code stroke. EXAM: CT HEAD WITHOUT CONTRAST TECHNIQUE: Contiguous axial images were obtained from the base of the skull through the vertex without intravenous contrast. COMPARISON:  CT head 08/23/2018 earlier today FINDINGS: Brain: Patchy white matter hypodensity unchanged. No new areas of hypodensity. No acute infarct, hemorrhage, mass. Ventricle size normal. Vascular: Negative for hyperdense vessel Skull: Negative Sinuses/Orbits: Negative Other: None ASPECTS ([facility] Early CT Score) - Ganglionic level infarction (caudate, lentiform nuclei, internal capsule, insula, M1-M3 cortex): 7 - Supraganglionic infarction (M4-M6 cortex): 3 Total score (0-10 with 10 being normal): 10 IMPRESSION: 1. No acute abnormality and no change from earlier today 2. ASPECTS is 10 3. These results were called by telephone at the time of interpretation on 08/18/2018 at 8:55 am to Dr. Rory Percy, who verbally acknowledged these results. Electronically Signed   By: Franchot Gallo M.D.   On: 08/10/2018 08:56    Labs:  CBC: Recent Labs    08/12/2018 2208 08/22/2018 1028 08/15/18 0512 08/16/18 0500  WBC 8.3 9.4 11.1* 9.6  HGB 10.8* 11.1* 9.7* 9.4*  HCT 35.7* 36.0 32.4* 31.0*  PLT 409* 418* 409* 381    COAGS: Recent Labs    08/21/2018 1028  INR 1.02    BMP: Recent Labs    08/05/2018 1028 08/15/18 0512 08/15/18 1220 08/16/18 0500  NA 136 137 137 137  K 3.4* 3.1* 3.4* 3.7  CL 105 108 111 111  CO2 21* 18* 17* 18*  GLUCOSE 255* 204* 199* 205*  BUN 12 13 12 17   CALCIUM 10.6* 9.5 9.7 9.7  CREATININE 1.03* 1.13* 1.04* 1.13*  GFRNONAA 59* 53* 58* 53*  GFRAA >60 >60 >60 >60    LIVER FUNCTION TESTS: Recent Labs    01/23/18 1352  BILITOT 0.8  AST  14*  ALT 11*  ALKPHOS 92  PROT 7.8  ALBUMIN 3.0*    Assessment and Plan: 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 Estanislado Pandy. Still no return of movement in BUE or RLE extremities, although did just complete work with PT.   MR yesterday showed: 1. Progressive acute pontine infarct. New patchy bilateral cerebellar infarction. New tiny left thalamic infarcts. 2. Preserved flow void in the stented basilar.  Remains on Brilinta 90 mg BID and ASA 81 mg once daily.   Electronically Signed: Docia Barrier, PA 08/16/2018, 11:46 AM   I spent a total of 15 Minutes at the the patient's bedside AND on the patient's hospital floor or unit, greater than 50% of which was counseling/coordinating care for distal basilar artery stenosis s/p stent assisted angioplasty.

## 2018-08-16 NOTE — Progress Notes (Signed)
PT Cancellation Note  Patient Details Name: Andrea [name] MRN: [id number] DOB: Mar 04, 1961   Cancelled Treatment:    Reason Eval/Treat Not Completed: Medical issues which prohibited therapy(pt remains on vent, fentanyl, with extension )   Amory Zbikowski B Keyanni Whittinghill 08/16/2018, 8:00 AM Elwyn Reach, PT Acute Rehabilitation Services Pager: [phone] Office: [phone]

## 2018-08-17 LAB — GLUCOSE, CAPILLARY
GLUCOSE-CAPILLARY: 175 mg/dL — AB (ref 70–99)
GLUCOSE-CAPILLARY: 198 mg/dL — AB (ref 70–99)
GLUCOSE-CAPILLARY: 220 mg/dL — AB (ref 70–99)
Glucose-Capillary: 189 mg/dL — ABNORMAL HIGH (ref 70–99)
Glucose-Capillary: 180 mg/dL — ABNORMAL HIGH (ref 70–99)
Glucose-Capillary: 209 mg/dL — ABNORMAL HIGH (ref 70–99)

## 2018-08-17 LAB — CBC
HCT: 32 % — ABNORMAL LOW (ref 36.0–46.0)
Hemoglobin: 9.3 g/dL — ABNORMAL LOW (ref 12.0–15.0)
MCH: 22.9 pg — ABNORMAL LOW (ref 26.0–34.0)
MCHC: 29.1 g/dL — AB (ref 30.0–36.0)
MCV: 78.8 fL — AB (ref 80.0–100.0)
PLATELETS: 357 10*3/uL (ref 150–400)
RBC: 4.06 MIL/uL (ref 3.87–5.11)
RDW: 15.8 % — ABNORMAL HIGH (ref 11.5–15.5)
WBC: 11.3 10*3/uL — ABNORMAL HIGH (ref 4.0–10.5)
nRBC: 0 % (ref 0.0–0.2)

## 2018-08-17 LAB — BASIC METABOLIC PANEL
ANION GAP: 8 (ref 5–15)
Anion gap: 9 (ref 5–15)
BUN: 26 mg/dL — AB (ref 6–20)
BUN: 27 mg/dL — ABNORMAL HIGH (ref 6–20)
CALCIUM: 10.1 mg/dL (ref 8.9–10.3)
CALCIUM: 10.1 mg/dL (ref 8.9–10.3)
CHLORIDE: 116 mmol/L — AB (ref 98–111)
CO2: 18 mmol/L — AB (ref 22–32)
CO2: 15 mmol/L — AB (ref 22–32)
CREATININE: 1.01 mg/dL — AB (ref 0.44–1.00)
Chloride: 113 mmol/L — ABNORMAL HIGH (ref 98–111)
Creatinine, Ser: 1.03 mg/dL — ABNORMAL HIGH (ref 0.44–1.00)
GFR calc Af Amer: >60 mL/min (ref >60)
GFR calc non Af Amer: >60 mL/min (ref >60)
GFR, EST NON AFRICAN AMERICAN: 59 mL/min — AB (ref >60)
GLUCOSE: 196 mg/dL — AB (ref 70–99)
GLUCOSE: 215 mg/dL — AB (ref 70–99)
Potassium: 3.8 mmol/L (ref 3.5–5.1)
Potassium: 4 mmol/L (ref 3.5–5.1)
Sodium: 140 mmol/L (ref 135–145)
Sodium: 139 mmol/L (ref 135–145)

## 2018-08-17 LAB — MAGNESIUM: MAGNESIUM: 1.7 mg/dL (ref 1.7–2.4)

## 2018-08-17 LAB — PHOSPHORUS: Phosphorus: 3.3 mg/dL (ref 2.5–4.6)

## 2018-08-17 MED ORDER — INSULIN GLARGINE 100 UNIT/ML ~~LOC~~ SOLN
10.0000 [IU] | Freq: Every day | SUBCUTANEOUS | Status: DC
  Administered 2018-08-17 – 2018-08-18 (×2): 10 [IU] via SUBCUTANEOUS
  Filled 2018-08-17 (×2): qty 0.1

## 2018-08-17 NOTE — Progress Notes (Signed)
NAME:  Andrea [name], MRN:  [id number], DOB:  02-04-1961, LOS: 3 ADMISSION DATE:  08/12/2018, CONSULTATION DATE:  09/04/2018 REFERRING MD:  Dr. Rory Percy, CHIEF COMPLAINT:  CP/ Acute CVA  Brief History   77 yoF w/poorly controlled HTN and IDDM presenting with CP and left arm pain w/BP 220/117. Initial CTH neg.  Cardiac workup negative for acute event thus far, cardiology following.  Code stroke initiated for R hemiplegia, dysarthria and right facial droop.  Out of window for TPA.  Requiring cleviprex for BP control.  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  Evolving pontine stroke on MRI. Intubated for neuro IR s/p stenting and angioplasty with 80-90% patency.  Extubated post procedure but due to insufficency placed on BiPAP and returns to ICU.    Past Medical History  HTN, IDDM, hypothyroidism s/p ablation  Significant Hospital Events   10/9 present to [facility] ED -transferred to [facility] 10/9 basilar artery stenting 10/10 CTA for declining exam - evolving pontine infarct, stent deemed to be patent  Consults: date of consult/date signed off & final recs:  Cards 10/10 Neurology 10/10 PCCM 10/10  Procedures (surgical and bedside):  10/10 Neuro IR >> s/p stent and angioplasty of distal basilar artery  10/10 ETT for procedure 10/10 ETT (reintubated) >>  10/10 L R art line >>  Significant Diagnostic Tests:  08/09/2018  CTH >>  No acute intracranial abnormalities. Mild white matter changes likely due to small vessel ischemia  08/1018 CTH >> 1. No acute abnormality and no change from earlier today 2. ASPECTS is 10  08/20/2018  CTA head and neck >>  1. 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 No definite acute thrombus identified. 2. Severe stenosis left posterior cerebral artery and moderate stenosis right posterior cerebral artery. 3. Mild stenosis left MCA and moderate stenosis left MCA bifurcation. 4. No  significant carotid or vertebral artery stenosis in the neck.  08/28/2018 MRI - evolving pontine infarct.  08/15/18  Echocardiogram - normal LVSF with severe LVH.   Micro Data:  08/16/2018 MRSA PCR >>negative  Antimicrobials:  10/10 cefazolin preop  Subjective:  No significant events reported Tolerated several hours of PSV on 10/12, currently on PS 10/PEEP 5 Not currently requiring sedation Blood pressure at goal  Objective   Blood pressure (!) 158/95, pulse 66, temperature 98.6 F (37 C), temperature source Axillary, resp. rate (!) 22, height 5\' 4"  (1.626 m), weight 131.4 kg, last menstrual period 07/26/2018, SpO2 99 %.    Vent Mode: CPAP;PSV FiO2 (%):  [30 %] 30 % Set Rate:  [15 bmp] 15 bmp Vt Set:  [430 mL] 430 mL PEEP:  [5 cmH20] 5 cmH20 Pressure Support:  [5 cmH20-10 cmH20] 10 cmH20 Plateau Pressure:  [12 cmH20-13 cmH20] 13 cmH20   Intake/Output Summary (Last 24 hours) at 08/17/2018 1333 Last data filed at 08/17/2018 0900 Gross per 24 hour  Intake 1617.73 ml  Output 2175 ml  Net -557.27 ml   Filed Weights   08/15/18 0515 08/16/18 0500 08/17/18 0600  Weight: 123.2 kg 129.1 kg 131.4 kg    Examination: General: Obese woman, ventilated HEENT: Endotracheal tube and OG tube in place, no oral lesions Neuro: Awake, nods to questions and follows commands.  She is able to move her left upper and lower extremities with moderate strength.  Delayed response but some slight movement of her right leg.  She cannot move her right arm.  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  Resulting in inadequate airway protection, difficulty following commands in the  extremities.  The latter is starting to improve -Continue strict blood pressure control, SBP < 180 -ASA, Brilinta for stent patency -manage hyperglycemia as below -Minimize sedation as able  Respiratory Insufficiency due to inability to protect airway from dysarthria.  Expected with location of stroke. -Continue pressure support as she can tolerate, back to [facility] as needed -Expect that she will likely require tracheostomy given her dysarthria, compromised airway protection.  Hypertensive Crisis -Continue current regimen, Cleviprex is off  Microcytic Anemia -Follow CBC - transfusion threshold Hgb < 7.0  Chest pain of unclear etiology.  Likely on noncardiac given negative troponin -Follow  Type 2 diabetes now on insulin -Add Lantus 10/13  Hypothyroidism, TSH 1.34 - based on home med list, does not appear to be on rx  Nutrition -start TF 10/13  Disposition / Summary of Today's Plan 08/17/18   Currently entering peak swelling window at risk for significant decompensation given location of the stroke.  Avoid hyponatremia and hyperglycemia.  Maintain adequate cerebral perfusion pressure. Suspect will need trach for airway protection    Diet: TF Pain/Anxiety/Delirium protocol (if indicated): N/a VAP protocol (if indicated): no DVT prophylaxis: SCDs  GI prophylaxis: Famotidine Hyperglycemia protocol: SSI, lantus Mobility: bedrest Code Status: Full  Family Communication: no family present 10/13  Labs ( personally  Reviewed)  CBC: Recent Labs  Lab 09/02/2018 2208 08/23/2018 1028 08/15/18 0512 08/16/18 0500 08/17/18 0502  WBC 8.3 9.4 11.1* 9.6 11.3*  NEUTROABS  --  5.2 9.1*  --   --   HGB 10.8* 11.1* 9.7* 9.4* 9.3*  HCT 35.7* 36.0 32.4* 31.0* 32.0*  MCV 78.5* 76.9* 77.1* 78.1* 78.8*  PLT 409* 418* 409* 381 160    Basic Metabolic Panel: Recent Labs  Lab 09/01/2018 2015  08/15/18 1220 08/15/18 1633 08/16/18 0500 08/16/18 1423 08/17/18 0502 08/17/18 1243  NA  --    <  > 137  --  137 136 140 139  K  --    < > 3.4*  --  3.7 3.9 3.8 4.0  CL  --    < > 111  --  111 113* 113* 116*  CO2  --    < > 17*  --  18* 18* 18* 15*  GLUCOSE  --    < > 199*  --  205* 209* 196* 215*  BUN  --    < > 12  --  17 20 26* 27*  CREATININE  --    < > 1.04*  --  1.13* 1.16* 1.03* 1.01*  CALCIUM  --    < > 9.7  --  9.7 9.5 10.1 10.1  MG 1.4*  --   --  1.7 1.8 1.6* 1.7  --   PHOS 3.3  --   --  3.6 3.2 3.3 3.3  --    < > = values in this interval not displayed.   GFR: Estimated Creatinine Clearance: 82.9 mL/min (A) (by C-G formula based on SCr of 1.01 mg/dL (H)). Recent Labs  Lab 08/22/2018 1028 08/15/18 0512 08/16/18 0500 08/17/18 0502  WBC 9.4 11.1* 9.6 11.3*  Liver Function Tests: No results for input(s): AST, ALT, ALKPHOS, BILITOT, PROT, ALBUMIN in the last 168 hours. No results for input(s): LIPASE, AMYLASE in the last 168 hours. No results for input(s): AMMONIA in the last 168 hours.  ABG    Component Value Date/Time   PHART 7.334 (L) 09/01/2018 2259   PCO2ART 41.9 08/19/2018 2259   PO2ART 275.0 (H) 08/19/2018 2259   HCO3 22.3 08/15/2018 2259   TCO2 24 08/15/2018 2259   ACIDBASEDEF 3.0 (H) 08/06/2018 2259   O2SAT 100.0 08/18/2018 2259     Coagulation Profile: Recent Labs  Lab 08/28/2018 1028  INR 1.02    Cardiac Enzymes: Recent Labs  Lab 09/01/2018 0317 08/13/2018 2015  TROPONINI 0.03* <0.03    HbA1C: Hgb A1c MFr Bld  Date/Time Value Ref Range Status  08/15/2018 05:12 AM 11.4 (H) 4.8 - 5.6 % Final    Comment:    (NOTE) Pre diabetes:          5.7%-6.4% Diabetes:              >6.4% Glycemic control for   <7.0% adults with diabetes   01/12/2017 02:43 AM 12.8 (H) 4.8 - 5.6 % Final    Comment:    (NOTE)         Pre-diabetes: 5.7 - 6.4         Diabetes: >6.4         Glycemic control for adults with diabetes: <7.0     CBG: Recent Labs  Lab 08/16/18 1926 08/16/18 2314 08/17/18 0307 08/17/18 0829 08/17/18 1221  GLUCAP 190* 242* 189*  175* 198*   CRITICAL CARE Performed by: Collene Gobble  Independent CC time 21 minutes  Baltazar Apo, MD, PhD 08/17/2018, 1:50 PM [facility] [phone] or if no answer [phone]

## 2018-08-17 NOTE — Progress Notes (Signed)
STROKE TEAM PROGRESS NOTE   SUBJECTIVE (INTERVAL HISTORY) Her RN is at the bedside.  Pt neuro stable overnight. MRI repeat Friday showed extension of brainstem stroke and new infarcts of b/l cerebellum. But she is improving, she is off of the Fentanyl and Cleviprex. She is awake, alert, following commands and nodding appropriately and mving her BLE and RUE.   OBJECTIVE Vitals:   08/17/18 0800 08/17/18 0830 08/17/18 0900 08/17/18 0930  BP: (!) 154/75 (!) 175/100 (!) 159/80 (!) 166/83  Pulse: 66 80 63 67  Resp: (!) 22 (!) 24 (!) 22 20  Temp: 99.6 F (37.6 C)     TempSrc: Axillary     SpO2: 100% 100% 100% 100%  Weight:      Height:        CBC:  Recent Labs  Lab 08/28/2018 1028 08/15/18 0512 08/16/18 0500 08/17/18 0502  WBC 9.4 11.1* 9.6 11.3*  NEUTROABS 5.2 9.1*  --   --   HGB 11.1* 9.7* 9.4* 9.3*  HCT 36.0 32.4* 31.0* 32.0*  MCV 76.9* 77.1* 78.1* 78.8*  PLT 418* 409* 381 528    Basic Metabolic Panel:  Recent Labs  Lab 08/16/18 1423 08/17/18 0502  NA 136 140  K 3.9 3.8  CL 113* 113*  CO2 18* 18*  GLUCOSE 209* 196*  BUN 20 26*  CREATININE 1.16* 1.03*  CALCIUM 9.5 10.1  MG 1.6* 1.7  PHOS 3.3 3.3    Lipid Panel:     Component Value Date/Time   CHOL 256 (H) 08/15/2018 0512   TRIG 248 (H) 08/15/2018 0512   HDL 35 (L) 08/15/2018 0512   CHOLHDL 7.3 08/15/2018 0512   VLDL 50 (H) 08/15/2018 0512   LDLCALC 171 (H) 08/15/2018 0512   HgbA1c:  Lab Results  Component Value Date   HGBA1C 11.4 (H) 08/15/2018   Urine Drug Screen:     Component Value Date/Time   LABOPIA NONE DETECTED 08/20/2018 1836   COCAINSCRNUR NONE DETECTED 08/23/2018 1836   LABBENZ NONE DETECTED 08/24/2018 1836   AMPHETMU NONE DETECTED 08/13/2018 1836   THCU NONE DETECTED 08/28/2018 1836   LABBARB NONE DETECTED 08/12/2018 1836    Alcohol Level No results found for: ETH  IMAGING  Ct Angio Head W Or Wo Contrast Ct Angio Neck W Or Wo Contrast 08/15/2018 IMPRESSION:  1. Interval basilar  to left proximal PCA stenting. The density of the stent walls precludes detection of in stent stenosis; there is a degree of wasting at the mid basilar segment. There is flow in the left PCA beyond the stent such that there is presumed stent patency.  2. Known lower pontine infarct. There are new small cerebellar infarcts since brain MRI yesterday.  3. Severe left P2 segment stenosis, also seen previously.  4. Moderate atheromatous narrowing in the proximal left MCA.    Ct Angio Head W Or Wo Contrast Ct Angio Neck W Or Wo Contrast 08/12/2018 IMPRESSION:  1. 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 No definite acute thrombus identified.  2. Severe stenosis left posterior cerebral artery and moderate stenosis right posterior cerebral artery.  3. Mild stenosis left MCA and moderate stenosis left MCA bifurcation.  4. No significant carotid or vertebral artery stenosis in the neck.    Ct Head Wo Contrast 08/08/2018 IMPRESSION:  No acute intracranial abnormalities. Mild white matter changes likely due to small vessel ischemia.    Mr Brain Wo Contrast 08/20/2018 IMPRESSION:  1. Acute  nonhemorrhagic infarct involving the left paramedian brainstem. The infarct crosses midline.  2. Other periventricular and subcortical white matter disease is moderately advanced for age. This likely reflects the sequela of chronic microvascular ischemia.  3. Tapering of the dens with prominent soft tissue pannus. 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    Ct Head Code Stroke Wo Contrast 08/12/2018  IMPRESSION:  1. No acute abnormality and no change from earlier today  2. ASPECTS is 10 3.    Portable Chest X-ray 08/09/2018 IMPRESSION:  Interval placement of endotracheal tube. Question developing perihilar edema or infectious process. Gaseous distension of the stomach.   Cerebral Angiogram 08/20/2018 S/P 4 vessel cerebral  arteriogram RT CFA approach. 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 S/P stent assisted angioplasty of distal basilar artery with patency of 80 to 90 %. 2.Approx 70 % stenosis of LT ICA supraclinoid seg   MRI 08/15/18 1. Progressive acute pontine infarct. New patchy bilateral cerebellar infarction. New tiny left thalamic infarcts. 2. Preserved flow void in the stented basilar. 3. left facial/submandibular edema, please correlate with neck exam. Edited result: IMPRESSION: 1. Progressive acute pontine infarct. New patchy bilateral cerebellar infarction. New tiny left thalamic infarcts. 2. Preserved flow void in the stented basilar.  TTE - Left ventricle: The cavity size was normal. There was severe   concentric hypertrophy. Systolic function was normal. The   estimated ejection fraction was in the range of 60% to 65%. Wall   motion was normal; there were no regional wall motion   abnormalities. Doppler parameters are consistent with abnormal   left ventricular relaxation (grade 1 diastolic dysfunction). The   E/e&' ratio is <8, suggesting normal LV filling pressure. - Aortic valve: Trileaflet; mildly calcified leaflets.   Transvalvular velocity was minimally increased. There was no   regurgitation. Mean gradient (S): 12 mm Hg. - Mitral valve: Mildly thickened leaflets . There was trivial   regurgitation. - Left atrium: The atrium was normal in size. - Inferior vena cava: The vessel was dilated. The respirophasic   diameter changes were blunted (< 50%), consistent with elevated   central venous pressure. Impressions: - LVEF 60-65%, severe LVH, normal wall motion, grade 1 DD, normal   LV filling pressure, minimally increased aortic velocity without   signficant stenosis, trivial MR, normal LA size, dilated IVC.   PHYSICAL EXAM  Temp:  [98.5 F (36.9 C)-100 F (37.8 C)]  99.6 F (37.6 C) (10/13 0800) Pulse Rate:  [63-94] 67 (10/13 0930) Resp:  [13-28] 20 (10/13 0930) BP: (112-208)/(65-122) 166/83 (10/13 0930) SpO2:  [99 %-100 %] 100 % (10/13 0930) Arterial Line BP: (155-191)/(65-72) 155/65 (10/12 1215) FiO2 (%):  [30 %] 30 % (10/13 0835) Weight:  [131.4 kg] 131.4 kg (10/13 0600)  General - Well nourished, well developed, intubated on low dose fentanyl.  Ophthalmologic - fundi not visualized due to noncooperation.  Cardiovascular - Regular rate and rhythm.  Neuro - awake, alert, eyes open, still intubated, not on  fentanyl. No ptosis, minimal disconjugate eyes with right eye upward deviation . Right gaze incomplete with rotational nystagmus. Can cross midline and nods yes that she can see examiner both on the right and left sides. PERRL. Corneal, gag and cough present. Facial symmetry and tongue not able to test due to ET tube.. Lft arm moves slightly to command as do the bilateral LE l able to wiggle toes and move  back and forth, left positive babinski, right negative babinski. DTR 1+. Sensation not cooperative, coordination and gait not tested.     ASSESSMENT/PLAN Ms. KALILAH BARUA is a 57 y.o. female with history of difficult to control hypertension, insulin-dependent diabetes, obesity, hypothyroidism status post ablation presenting with chest discomfort, right-sided weakness, slurred speech and right facial droop. She did not receive IV t-PA due to late presentation. S/P stent assisted angioplasty of distal basilar artery.  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 Worsening symptoms with extension of pontine/medullary infarcts with new small bilateral cerebellar infarcts without evidence of stent re-stenosis or occlusion  Resultant b// UE and LE weakness, eyes minimally disconjugate but improving, nystagmus  CT head - No acute intracranial abnormalities.  CTA H&N 08/12/2018 - Extensive atherosclerotic  disease in the basilar with focal critical stenosis in the mid to distal basilar. No definite acute thrombus identified. Severe stenosis left posterior cerebral artery  MRI head - Acute nonhemorrhagic infarct involving the left paramedian brainstem.   CTA H&N 08/15/2018 - stent too dense to see BA lumen, but distal flow preserved. New small cerebellar infarcts since brain MRI yesterday.  Severe left P2 segment stenosis, also seen previously.   Repeat MRI - extension of b/l pontine and upper medullary infarcts with new b/l cerebellar infarcts.  2D Echo - EF 60-65%  LDL - 174  HgbA1c - 11.1  P2Y12 = 35  VTE prophylaxis - heparin subq  Diet - NPO  aspirin 81 mg daily prior to admission, now on Brilinta 90 mg daily and ASA 81 mg daily.   Patient counseled to be compliant with her antithrombotic medications  Ongoing aggressive stroke risk factor management  Therapy recommendations:  pending  Disposition:  Pending  Intracranial stenosis  BA mid to distal severe stenosis s/p BA stenting  Left PCA severe stenosis, R PCA moderate stenosis  Left MCA moderate stenosis  Uncontrolled stroke risk factors with HLD, HTN, DM  Non compliance with meds at home  Respiratory failure  Intubated  CCM on board  Continue vent support  Hypertension  Off of Cleviprex at this time, not on fentanyl  BP goal 130-180   On po norvasc, losartan and metoprolol  D/c A - line  BP go by cuff pressure  Avoid low BP  Hyperlipidemia  Lipid lowering medication PTA:  Lipitor 20 mg daily  LDL 174, goal < 70  Increase to 80 mg daily  Continue statin at discharge  Diabetes  HgbA1c 11.4, goal < 7.0  Uncontrolled  Hyperglycemia improving  SSI  CBG monitoring  Other Stroke Risk Factors  Obesity, Body mass index is 49.72 kg/m., recommend weight loss, diet and exercise as appropriate   Screen for sleep apnea when appropriate and may benefit from referral to [facility]  team  Other Active Problems  Anemia due to acute blood loss - 11.1->9.7->9.4->9.3  Elevated creatinine - 1.04->1.13->1.03  Hypokalemia - 3.1 -> supplement->3.7->3.8  Leukocytosis - 9.4->11.1->9.6->11.3  Hospital day # 3  This patient is critically ill and at significant risk of neurological worsening, death and care requires constant monitoring of vital signs, hemodynamics,respiratory and cardiac monitoring,review of multiple databases, neurological assessment, discussion with family, other specialists and medical decision making of high complexity.I  I spent 30 minutes of neurocritical care time in the care of this patient.  Sarina Ill, MD [facility]    To contact Stroke Continuity provider, please refer to [email or web address] After hours, contact General  Neurology

## 2018-08-18 ENCOUNTER — Inpatient Hospital Stay (HOSPITAL_COMMUNITY): Payer: Medicaid Other

## 2018-08-18 ENCOUNTER — Encounter (HOSPITAL_COMMUNITY): Payer: Self-pay | Admitting: Interventional Radiology

## 2018-08-18 DIAGNOSIS — R042 Hemoptysis: Secondary | ICD-10-CM

## 2018-08-18 LAB — GLUCOSE, CAPILLARY
GLUCOSE-CAPILLARY: 177 mg/dL — AB (ref 70–99)
GLUCOSE-CAPILLARY: 180 mg/dL — AB (ref 70–99)
Glucose-Capillary: 253 mg/dL — ABNORMAL HIGH (ref 70–99)
Glucose-Capillary: 284 mg/dL — ABNORMAL HIGH (ref 70–99)
Glucose-Capillary: 285 mg/dL — ABNORMAL HIGH (ref 70–99)
Glucose-Capillary: 285 mg/dL — ABNORMAL HIGH (ref 70–99)

## 2018-08-18 LAB — CBC
HEMATOCRIT: 31.8 % — AB (ref 36.0–46.0)
Hemoglobin: 9.3 g/dL — ABNORMAL LOW (ref 12.0–15.0)
MCH: 23 pg — AB (ref 26.0–34.0)
MCHC: 29.2 g/dL — AB (ref 30.0–36.0)
MCV: 78.7 fL — ABNORMAL LOW (ref 80.0–100.0)
Platelets: 393 10*3/uL (ref 150–400)
RBC: 4.04 MIL/uL (ref 3.87–5.11)
RDW: 15.8 % — ABNORMAL HIGH (ref 11.5–15.5)
WBC: 10.8 10*3/uL — ABNORMAL HIGH (ref 4.0–10.5)
nRBC: 0 % (ref 0.0–0.2)

## 2018-08-18 LAB — PROTIME-INR
INR: 1.12
PROTHROMBIN TIME: 14.3 s (ref 11.4–15.2)

## 2018-08-18 LAB — BASIC METABOLIC PANEL
Anion gap: 6 (ref 5–15)
BUN: 28 mg/dL — AB (ref 6–20)
CHLORIDE: 116 mmol/L — AB (ref 98–111)
CO2: 21 mmol/L — ABNORMAL LOW (ref 22–32)
Calcium: 10.4 mg/dL — ABNORMAL HIGH (ref 8.9–10.3)
Creatinine, Ser: 0.95 mg/dL (ref 0.44–1.00)
GFR calc Af Amer: >60 mL/min (ref >60)
GFR calc non Af Amer: >60 mL/min (ref >60)
GLUCOSE: 206 mg/dL — AB (ref 70–99)
POTASSIUM: 3.3 mmol/L — AB (ref 3.5–5.1)
Sodium: 143 mmol/L (ref 135–145)

## 2018-08-18 LAB — APTT: APTT: 29 s (ref 24–36)

## 2018-08-18 MED ORDER — INSULIN ASPART 100 UNIT/ML ~~LOC~~ SOLN
4.0000 [IU] | SUBCUTANEOUS | Status: DC
  Administered 2018-08-18 – 2018-08-27 (×53): 4 [IU] via SUBCUTANEOUS

## 2018-08-18 MED ORDER — VITAL HIGH PROTEIN PO LIQD
1000.0000 mL | ORAL | Status: DC
  Administered 2018-08-18 – 2018-08-24 (×4): 1000 mL

## 2018-08-18 MED ORDER — FENTANYL CITRATE (PF) 100 MCG/2ML IJ SOLN
25.0000 ug | INTRAMUSCULAR | Status: DC | PRN
  Administered 2018-08-19 (×3): 25 ug via INTRAVENOUS
  Filled 2018-08-18 (×3): qty 2

## 2018-08-18 MED ORDER — INSULIN GLARGINE 100 UNIT/ML ~~LOC~~ SOLN
12.0000 [IU] | Freq: Every day | SUBCUTANEOUS | Status: DC
  Administered 2018-08-19: 12 [IU] via SUBCUTANEOUS
  Filled 2018-08-18: qty 0.12

## 2018-08-18 MED ORDER — PRO-STAT SUGAR FREE PO LIQD
30.0000 mL | Freq: Three times a day (TID) | ORAL | Status: DC
  Administered 2018-08-18 – 2018-08-25 (×21): 30 mL
  Filled 2018-08-18 (×21): qty 30

## 2018-08-18 NOTE — Progress Notes (Signed)
RT contacted Elink Md about pts hemoptysis and auto PEEP issues. MD has ordered increase in PEEP from 5 to 8 and to saline lavage pt until hemoptysis clears. MD also ordered a CXR, and PRN treatment given after lavage. RT will continue to monitor.

## 2018-08-18 NOTE — Progress Notes (Signed)
STROKE TEAM PROGRESS NOTE   SUBJECTIVE (INTERVAL HISTORY) Her sister and husband are   at the bedside.  Pt neuro stable overnight.   She is awake, alert, following commands and nodding appropriately  .Few episodes of blood-tinged sputum, bright red; overnight last night and early this morning She had some respiratory distress, was dyssynchronous with PRVC at the time.  Chest x-ray was unrevealing.  PEEP was increased 8 transiently OBJECTIVE Vitals:   08/18/18 1400 08/18/18 1500 08/18/18 1512 08/18/18 1600  BP: (!) 160/71 (!) 171/78 (!) 171/78 (!) 166/73  Pulse: 79 84 88 85  Resp: (!) 29 (!) 29 19 (!) 32  Temp:    99.9 F (37.7 C)  TempSrc:    Axillary  SpO2: 97% 99% 100% 96%  Weight:      Height:        CBC:  Recent Labs  Lab 08/18/2018 1028 08/15/18 0512  08/17/18 0502 08/18/18 0626  WBC 9.4 11.1*   < > 11.3* 10.8*  NEUTROABS 5.2 9.1*  --   --   --   HGB 11.1* 9.7*   < > 9.3* 9.3*  HCT 36.0 32.4*   < > 32.0* 31.8*  MCV 76.9* 77.1*   < > 78.8* 78.7*  PLT 418* 409*   < > 357 393   < > = values in this interval not displayed.    Basic Metabolic Panel:  Recent Labs  Lab 08/16/18 1423 08/17/18 0502 08/17/18 1243 08/18/18 0626  NA 136 140 139 143  K 3.9 3.8 4.0 3.3*  CL 113* 113* 116* 116*  CO2 18* 18* 15* 21*  GLUCOSE 209* 196* 215* 206*  BUN 20 26* 27* 28*  CREATININE 1.16* 1.03* 1.01* 0.95  CALCIUM 9.5 10.1 10.1 10.4*  MG 1.6* 1.7  --   --   PHOS 3.3 3.3  --   --     Lipid Panel:     Component Value Date/Time   CHOL 256 (H) 08/15/2018 0512   TRIG 248 (H) 08/15/2018 0512   HDL 35 (L) 08/15/2018 0512   CHOLHDL 7.3 08/15/2018 0512   VLDL 50 (H) 08/15/2018 0512   LDLCALC 171 (H) 08/15/2018 0512   HgbA1c:  Lab Results  Component Value Date   HGBA1C 11.4 (H) 08/15/2018   Urine Drug Screen:     Component Value Date/Time   LABOPIA NONE DETECTED 08/11/2018 1836   COCAINSCRNUR NONE DETECTED 09/01/2018 1836   LABBENZ NONE DETECTED 08/16/2018 1836   AMPHETMU  NONE DETECTED 08/06/2018 1836   THCU NONE DETECTED 08/18/2018 1836   LABBARB NONE DETECTED 08/07/2018 1836    Alcohol Level No results found for: ETH  IMAGING  Ct Angio Head W Or Wo Contrast Ct Angio Neck W Or Wo Contrast 08/15/2018 IMPRESSION:  1. Interval basilar to left proximal PCA stenting. The density of the stent walls precludes detection of in stent stenosis; there is a degree of wasting at the mid basilar segment. There is flow in the left PCA beyond the stent such that there is presumed stent patency.  2. Known lower pontine infarct. There are new small cerebellar infarcts since brain MRI yesterday.  3. Severe left P2 segment stenosis, also seen previously.  4. Moderate atheromatous narrowing in the proximal left MCA.    Ct Angio Head W Or Wo Contrast Ct Angio Neck W Or Wo Contrast 09/02/2018 IMPRESSION:  1. 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 No definite acute  thrombus identified.  2. Severe stenosis left posterior cerebral artery and moderate stenosis right posterior cerebral artery.  3. Mild stenosis left MCA and moderate stenosis left MCA bifurcation.  4. No significant carotid or vertebral artery stenosis in the neck.    Ct Head Wo Contrast 09/02/2018 IMPRESSION:  No acute intracranial abnormalities. Mild white matter changes likely due to small vessel ischemia.    Mr Brain Wo Contrast 08/21/2018 IMPRESSION:  1. Acute nonhemorrhagic infarct involving the left paramedian brainstem. The infarct crosses midline.  2. Other periventricular and subcortical white matter disease is moderately advanced for age. This likely reflects the sequela of chronic microvascular ischemia.  3. Tapering of the dens with prominent soft tissue pannus. 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    Ct Head Code Stroke Wo Contrast 08/09/2018  IMPRESSION:  1. No acute abnormality and no change from earlier today   2. ASPECTS is 10 3.    Portable Chest X-ray 08/17/2018 IMPRESSION:  Interval placement of endotracheal tube. Question developing perihilar edema or infectious process. Gaseous distension of the stomach.   Cerebral Angiogram 08/10/2018 S/P 4 vessel cerebral arteriogram RT CFA approach. 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 S/P stent assisted angioplasty of distal basilar artery with patency of 80 to 90 %. 2.Approx 70 % stenosis of LT ICA supraclinoid seg   MRI 08/15/18 1. Progressive acute pontine infarct. New patchy bilateral cerebellar infarction. New tiny left thalamic infarcts. 2. Preserved flow void in the stented basilar. 3. left facial/submandibular edema, please correlate with neck exam. Edited result: IMPRESSION: 1. Progressive acute pontine infarct. New patchy bilateral cerebellar infarction. New tiny left thalamic infarcts. 2. Preserved flow void in the stented basilar.  TTE - Left ventricle: The cavity size was normal. There was severe   concentric hypertrophy. Systolic function was normal. The   estimated ejection fraction was in the range of 60% to 65%. Wall   motion was normal; there were no regional wall motion   abnormalities. Doppler parameters are consistent with abnormal   left ventricular relaxation (grade 1 diastolic dysfunction). The   E/e&' ratio is <8, suggesting normal LV filling pressure. - Aortic valve: Trileaflet; mildly calcified leaflets.   Transvalvular velocity was minimally increased. There was no   regurgitation. Mean gradient (S): 12 mm Hg. - Mitral valve: Mildly thickened leaflets . There was trivial   regurgitation. - Left atrium: The atrium was normal in size. - Inferior vena cava: The vessel was dilated. The respirophasic   diameter changes were blunted (< 50%), consistent with elevated   central venous  pressure. Impressions: - LVEF 60-65%, severe LVH, normal wall motion, grade 1 DD, normal   LV filling pressure, minimally increased aortic velocity without   signficant stenosis, trivial MR, normal LA size, dilated IVC.   PHYSICAL EXAM  Temp:  [98.3 F (36.8 C)-100.1 F (37.8 C)] 99.9 F (37.7 C) (10/14 1600) Pulse Rate:  [60-110] 85 (10/14 1600) Resp:  [13-39] 32 (10/14 1600) BP: (140-213)/(68-103) 166/73 (10/14 1600) SpO2:  [96 %-100 %] 96 % (10/14 1600) FiO2 (%):  [30 %] 30 % (10/14 1512) Weight:  [127.2 kg] 127.2 kg (10/14 0500)  General -  Obese middle-aged African-American lady, intubated    Ophthalmologic - fundi not visualized due to noncooperation.  Cardiovascular - Regular rate and rhythm.  Neuro - awake, alert, eyes open, still intubated, not on  sedation. No ptosis,  minimal disconjugate eyes with right eye upward deviation . Right gaze incomplete with rotational nystagmus. Can cross midline and nods yes that she can see examiner both on the right and left sides. PERRL. Corneal, gag and cough present. Facial symmetry and tongue not able to test due to ET tube.. Lft arm moves slightly to command as do the bilateral LE  able to wiggle toes and move back and forth, left positive babinski, right negative babinski. DTR 1+. Sensation not cooperative, coordination and gait not tested.     ASSESSMENT/PLAN Andrea [name] is a 57 y.o. female with history of difficult to control hypertension, insulin-dependent diabetes, obesity, hypothyroidism status post ablation presenting with chest discomfort, right-sided weakness, slurred speech and right facial droop. She did not receive IV t-PA due to late presentation. S/P stent assisted angioplasty of distal basilar artery.  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 Worsening symptoms with extension of pontine/medullary infarcts with new small bilateral cerebellar infarcts without evidence  of stent re-stenosis or occlusion  Resultant b// UE and LE weakness, eyes minimally disconjugate but improving, nystagmus  CT head - No acute intracranial abnormalities.  CTA H&N 08/16/2018 - Extensive atherosclerotic disease in the basilar with focal critical stenosis in the mid to distal basilar. No definite acute thrombus identified. Severe stenosis left posterior cerebral artery  MRI head - Acute nonhemorrhagic infarct involving the left paramedian brainstem.   CTA H&N 08/15/2018 - stent too dense to see BA lumen, but distal flow preserved. New small cerebellar infarcts since brain MRI yesterday.  Severe left P2 segment stenosis, also seen previously.   Repeat MRI - extension of b/l pontine and upper medullary infarcts with new b/l cerebellar infarcts.  2D Echo - EF 60-65%  LDL - 174  HgbA1c - 11.1  P2Y12 = 35  VTE prophylaxis - heparin subq  Diet - NPO  aspirin 81 mg daily prior to admission, now on Brilinta 90 mg daily and ASA 81 mg daily.   Patient counseled to be compliant with her antithrombotic medications  Ongoing aggressive stroke risk factor management  Therapy recommendations:  pending  Disposition:  Pending  Intracranial stenosis  BA mid to distal severe stenosis s/p BA stenting  Left PCA severe stenosis, R PCA moderate stenosis  Left MCA moderate stenosis  Uncontrolled stroke risk factors with HLD, HTN, DM  Non compliance with meds at home  Respiratory failure  Intubated  CCM on board  Continue vent support  Hypertension  Off of Cleviprex at this time, not on fentanyl  BP goal 130-180   On po norvasc, losartan and metoprolol  D/c A - line  BP go by cuff pressure  Avoid low BP  Hyperlipidemia  Lipid lowering medication PTA:  Lipitor 20 mg daily  LDL 174, goal < 70  Increase to 80 mg daily  Continue statin at discharge  Diabetes  HgbA1c 11.4, goal < 7.0  Uncontrolled  Hyperglycemia improving  SSI  CBG  monitoring  Other Stroke Risk Factors  Obesity, Body mass index is 48.13 kg/m., recommend weight loss, diet and exercise as appropriate   Screen for sleep apnea when appropriate and may benefit from referral to [facility] Sleep team  Other Active Problems  Anemia due to acute blood loss - 11.1->9.7->9.4->9.3  Elevated creatinine - 1.04->1.13->1.03  Hypokalemia - 3.1 -> supplement->3.7->3.8  Leukocytosis - 9.4->11.1->9.6->11.3  Hospital day # 4 Patient is unlikely to protect her airway and the benefit with consideration for  early tracheostomy and PEG tube. Long discussion with the patient and family members at the bedside as well as with Dr. Malvin Johns critical care medicine who agreed with plan of care. Sister to discuss with rest of family members and make final decision about trach and PEG tomorrow This patient is critically ill and at significant risk of neurological worsening, death and care requires constant monitoring of vital signs, hemodynamics,respiratory and cardiac monitoring,review of multiple databases, neurological assessment, discussion with family, other specialists and medical decision making of high complexity.I  I spent 35 minutes of neurocritical care time in the care of this patient.  Antony Contras, MD [facility]    To contact Stroke Continuity provider, please refer to [email or web address] After hours, contact General Neurology

## 2018-08-18 NOTE — Progress Notes (Signed)
Nutrition Follow-up  DOCUMENTATION CODES:   Morbid obesity  INTERVENTION:   Initiate Vital High Protein @ 55 ml/hr (1320 ml/day) 30 ml Prostat TID  Provides: 1520 kcal, 145 grams protein, and 1103 ml free water.    NUTRITION DIAGNOSIS:   Inadequate oral intake related to inability to eat as evidenced by NPO status. Ongoing  GOAL:   Patient will meet greater than or equal to 90% of their needs Not met.   MONITOR:   Vent status, TF tolerance, I & O's, Labs  REASON FOR ASSESSMENT:   Consult Enteral/tube feeding initiation and management  ASSESSMENT:   Andrea [name] is a 57 yo female with PMH of type 2 diabetes, HTN, CKD III, obesity, and anemia admitted for chest pain. Code Stroke called 10/10 in AM. 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 Intubated 10/10 in ICU.   10/11 started TF but held later that evening 10/14 restarted TF  Pt no longer requiring Propofol or Cleviprex  Patient is currently intubated on ventilator support Temp (24hrs), Avg:99.5 F (37.5 C), Min:99.2 F (37.3 C), Max:100.1 F (37.8 C)  Medications reviewed and include: SSI, lantus 10 units daily, MVI Labs reviewed: K+ 3.3 (L) CBG (last 3)  Recent Labs    08/18/18 0308 08/18/18 0817 08/18/18 1158  GLUCAP 177* 180* 253*   Lab Results  Component Value Date   HGBA1C 11.4 (H) 08/15/2018       Diet Order:   Diet Order            Diet NPO time specified  Diet effective now              EDUCATION NEEDS:   Not appropriate for education at this time  Skin:  Skin Assessment: Reviewed RN Assessment  Last BM:  10/9  Height:   Ht Readings from Last 1 Encounters:  08/15/18 5' 4" (1.626 m)    Weight:   Wt Readings from Last 1 Encounters:  08/18/18 127.2 kg    Ideal Body Weight:  54.5 kg  BMI:  Body mass index is 48.13 kg/m.  Estimated Nutritional Needs:   Kcal:  1355-1725 (11-14 kcal/kg BW)  Protein:  136 grams (2.5 g/kg IBW)   Fluid:  >1.5 L/day  Maylon Peppers RD, LDN, CNSC [phone] Pager [phone] After Hours Pager\

## 2018-08-18 NOTE — Progress Notes (Signed)
Inpatient Diabetes Program Recommendations  AACE/ADA: New Consensus Statement on Inpatient Glycemic Control (2015)  Target Ranges:  Prepandial:   less than 140 mg/dL      Peak postprandial:   less than 180 mg/dL (1-2 hours)      Critically ill patients:  140 - 180 mg/dL   Lab Results  Component Value Date   GLUCAP 180 (H) 08/18/2018   HGBA1C 11.4 (H) 08/15/2018    Review of Glycemic Control Results for FAE, BLOSSOM (MRN [id number]) as of 08/18/2018 11:04  Ref. Range 08/17/2018 19:56 08/17/2018 23:04 08/18/2018 03:08 08/18/2018 08:17  Glucose-Capillary Latest Ref Range: 70 - 99 mg/dL 220 (H) 209 (H) 177 (H) 180 (H)   Diabetes history: Type 2 DM Outpatient Diabetes medications: Lantus 27 units QHS, Metformin 1000 mg BID Current orders for Inpatient glycemic control: Lantus 10 units QD, Novolog 0-20 units Q4H  Inpatient Diabetes Program Recommendations:    Noted start of tube feeds, consider adding Novolog 4 units Q4H for tube feed coverage.   Additionally, consider increasing Lantus to 12 units QD.  Thanks, Bronson Curb, MSN, RNC-OB Diabetes Coordinator [phone] (8a-5p)

## 2018-08-18 NOTE — Progress Notes (Signed)
Occupational Therapy Evaluation Patient Details Name: Andrea [name] MRN: [id number] DOB: 1960/11/19 Today's Date: 08/18/2018    History of Present Illness 57 yo presented to ED with chest pain noted Rt hemiparesis in ED with acute Left paramedian brainstem infarct. Intubated 10/10 for angioplasty, extubated post procedure and reintubated. Repeat MRI with brainstem infarct extension and bil cerebellar infarcts. PMHx: HTN, DM, CKD   Clinical Impression   PTA, pt lived with her 2 grand children (5 yo and 20 yo), who she has been  raising since her daughter's death 6 yrs ago. Pt involved with the grandkid's activities. Pt currently requires total A +2 for mobility and total A for ADL.  Pt currently intubated on vent but appears to be following commands. Will follow acutely to address established goals and facilitate DC to next venue of care. Very supportive family.     Follow Up Recommendations  Other (comment);Supervision/Assistance - 24 hour(LTACH vs snf pending progression and vent status)    Equipment Recommendations       Recommendations for Other Services       Precautions / Restrictions Precautions Precautions: Fall Precaution Comments: vent, panda Restrictions Weight Bearing Restrictions: No      Mobility Bed Mobility Overal bed mobility: Needs Assistance             General bed mobility comments: total A +2; pt left in chair position in bed  Transfers                 General transfer comment: unable    Balance                                           ADL either performed or assessed with clinical judgement   ADL Overall ADL's : Needs assistance/impaired                                       General ADL Comments: total A at this time     Vision Baseline Vision/History: Wears glasses Wears Glasses: Reading only Vision Assessment?: Vision impaired- to be further tested in functional context Additional  Comments: appeasr to have dysconjugate gaze; nystagmus. Will further assess     Perception     Praxis      Pertinent Vitals/Pain Pain Assessment: Faces Faces Pain Scale: Hurts a little bit Pain Location: with neck movement Pain Descriptors / Indicators: Grimacing Pain Intervention(s): Limited activity within patient's tolerance     Hand Dominance Left   Extremity/Trunk Assessment Upper Extremity Assessment Upper Extremity Assessment: RUE deficits/detail;LUE deficits/detail RUE Deficits / Details: no AROM noted throughout session RUE Sensation: (will further assess) RUE Coordination: decreased fine motor;decreased gross motor LUE Deficits / Details: Pt with active shoulder/elbow movemetn into flexor pattern; no isolated joint movement; trace movemetn of L thumb; not using functionally; edematous L hand LUE Sensation: (will further assess) LUE Coordination: decreased fine motor;decreased gross motor   Lower Extremity Assessment RLE Deficits / Details: pt with trace movement of right hip rotation no other AROM, pt shaking her head that she can sense touch LLE Deficits / Details: 2-/5 knee flexion and extension as well as hip abduction/ adduction. trace movement of toes and dorsiflexion; kicking leg in chair position   Cervical / Trunk Assessment Cervical / Trunk Assessment: Other exceptions Cervical /  Trunk Exceptions: R lateral lean in bed   Communication Communication Communication: Other (comment)(vent, ETT)   Cognition Arousal/Alertness: Awake/alert Behavior During Therapy: Flat affect Overall Cognitive Status: Difficult to assess                                 General Comments: following 1 step commands; nodding head appropriately; blinking eyes for yes/no   General Comments       Exercises Exercises: Other exercises General Exercises - Upper Extremity Shoulder Flexion: (3 reps) Elbow Flexion: (3 reps) General Exercises - Lower Extremity Short Arc  Quad: (PROM x 5 on RLE) Hip ABduction/ADduction: (PROM x 5 on RLE) Other Exercises Other Exercises: RUE PROM Other Exercises: LUE AAROM (shoulder/elbow)  - Brunstorm stage 2  and PROM L hand Other Exercises: Education on need to keep BUE elevated to decease dependent edema   Shoulder Instructions      Home Living Family/patient expects to be discharged to:: Private residence Living Arrangements: Children Available Help at Discharge: Family;Available 24 hours/day Type of Home: House Home Access: Stairs to enter [employer] of Steps: 6   Home Layout: One level     Bathroom Shower/Tub: Teacher, early years/pre: Standard     Home Equipment: None          Prior Functioning/Environment Level of Independence: Independent        Comments: caregiver for her 2 grandchildren (79 yo son and 70 yo daughter). their mother (her daughter) was murdered 8 yrs ago; did not work; enjoyed doing activities with her grand children        OT Problem List: Decreased strength;Decreased range of motion;Decreased activity tolerance;Impaired balance (sitting and/or standing);Impaired vision/perception;Decreased coordination;Decreased cognition;Decreased safety awareness;Decreased knowledge of use of DME or AE;Decreased knowledge of precautions;Cardiopulmonary status limiting activity;Impaired sensation;Impaired tone;Obesity;Impaired UE functional use;Increased edema;Pain      OT Treatment/Interventions: Self-care/ADL training;Therapeutic exercise;Neuromuscular education;DME and/or AE instruction;Therapeutic activities;Splinting;Cognitive remediation/compensation;Visual/perceptual remediation/compensation;Patient/family education;Balance training    OT Goals(Current goals can be found in the care plan section) Acute Rehab OT Goals Patient Stated Goal: be able to move OT Goal Formulation: With patient/family Time For Goal Achievement: 08/05/2018 Potential to Achieve Goals: Fair   OT Frequency: Min 2X/week   Barriers to D/C:            Co-evaluation              AM-PAC PT "6 Clicks" Daily Activity     Outcome Measure Help from another person eating meals?: Total Help from another person taking care of personal grooming?: Total Help from another person toileting, which includes using toliet, bedpan, or urinal?: Total Help from another person bathing (including washing, rinsing, drying)?: Total Help from another person to put on and taking off regular upper body clothing?: Total Help from another person to put on and taking off regular lower body clothing?: Total 6 Click Score: 6   End of Session Equipment Utilized During Treatment: Other (comment)(vent) Nurse Communication: Mobility status  Activity Tolerance: Patient tolerated treatment well Patient left: in bed;with call bell/phone within reach;with SCD's reapplied;with family/visitor present(chair position)  OT Visit Diagnosis: Other abnormalities of gait and mobility (R26.89);Muscle weakness (generalized) (M62.81);Low vision, both eyes (H54.2);Feeding difficulties (R63.3);Other symptoms and signs involving cognitive function;Hemiplegia and hemiparesis;Pain Hemiplegia - Right/Left: Right Hemiplegia - dominant/non-dominant: Non-Dominant Hemiplegia - caused by: Nontraumatic intracerebral hemorrhage;Cerebral infarction Pain - part of body: (neck with movement)  Time: 2820-8138 OT Time Calculation (min): 32 min Charges:  OT General Charges $OT Visit: 1 Visit OT Evaluation $OT Eval High Complexity: 1 High OT Treatments $Therapeutic Activity: 8-22 mins  Maurie Boettcher, OT/L   Acute OT Clinical Specialist Acute Rehabilitation Services Pager [phone] Office [phone]   [facility] 08/18/2018, 3:33 PM

## 2018-08-18 NOTE — Progress Notes (Signed)
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 Estanislado Pandy. Patient awake and alert laying in bed intubated without sedation. Accompanied by niece and family friend at bedside. Can wiggle bilateral toes, minimal movements of right upper extremity, no spontaneous movements of left upper extremity. Right groin incision c/d/i.   Allergies: Tramadol  Medications: Prior to Admission medications   Medication Sig Start Date End Date Taking? Authorizing Provider  amLODipine (NORVASC) 10 MG tablet Take 1 tablet (10 mg total) by mouth daily. 05/16/18  Yes Elsie Stain, MD  aspirin EC 81 MG tablet Take 81 mg by mouth daily.   Yes [provider]  atorvastatin (LIPITOR) 20 MG tablet Take 1 tablet (20 mg total) by mouth daily. 01/29/18  Yes Verner Mould, MD  benzonatate (TESSALON) 100 MG capsule Take 1 capsule (100 mg total) by mouth every 8 (eight) hours. 12/16/17  Yes Maczis, Barth Kirks, PA-C  cloNIDine (CATAPRES) 0.2 MG tablet Take 1.5 tablets (0.3 mg total) by mouth 2 (two) times daily. 05/16/18  Yes Elsie Stain, MD  ferrous sulfate 325 (65 FE) MG EC tablet Take 1 tablet (325 mg total) by mouth 3 (three) times daily with meals. 01/17/17  Yes Hensel, Jamal Collin, MD  gabapentin (NEURONTIN) 300 MG capsule Take 1 capsule (300 mg total) by mouth 3 (three) times daily. Patient taking differently: Take 300 mg by mouth 3 (three) times daily as needed (nerve pain).  12/26/16  Yes Lysbeth Penner, FNP  hydrochlorothiazide (HYDRODIURIL) 25 MG tablet TAKE 1 TABLET (25 MG TOTAL) BY MOUTH DAILY. 05/16/18  Yes Elsie Stain, MD  ibuprofen (ADVIL,MOTRIN) 800 MG tablet Take 1 tablet (800 mg total) by mouth every 8 (eight) hours as needed. Patient taking differently: Take 800 mg by  mouth every 8 (eight) hours as needed for headache or mild pain.  01/19/17  Yes Lawyer, Harrell Gave, PA-C  Insulin Glargine (LANTUS SOLOSTAR) 100 UNIT/ML Solostar Pen INJECT 27 UNITS INTO THE SKIN AT BEDTIME. 05/16/18  Yes Elsie Stain, MD  losartan (COZAAR) 25 MG tablet Take 1 tablet (25 mg total) by mouth daily. 05/16/18  Yes Elsie Stain, MD  megestrol (MEGACE) 40 MG tablet TAKE 2 TABLETS BY MOUTH TWICE A DAY , MAY INCREASE TO 3 TABLETS TWICE DAILY FOR HEAVY BLEEDING Patient taking differently: Take 80 mg by mouth 2 (two) times daily. May increase to 3 tablets twice daily for heavy bleeding 11/26/17  Yes Anyanwu, Sallyanne Havers, MD  metFORMIN (GLUCOPHAGE) 1000 MG tablet Take 1 tablet (1,000 mg total) by mouth 2 (two) times daily with a meal. 05/16/18  Yes Elsie Stain, MD  metoprolol succinate (TOPROL XL) 50 MG 24 hr tablet Take 1 tablet (50 mg total) by mouth daily. Take with or immediately following a meal. 05/16/18  Yes Elsie Stain, MD  VOLTAREN 1 % GEL APPLY 2 G TOPICALLY 4 (FOUR) TIMES DAILY. Patient taking differently: Apply 2 g topically 4 (four) times daily as needed (pain).  01/16/17  Yes Bacigalupo, Dionne Bucy, MD  Insulin Pen Needle (B-D ULTRAFINE III SHORT PEN) 31G X 8 MM MISC USE AS DIRECTED TO INJECT INSULIN AT BEDTIME 01/15/18   Verner Mould, MD  Insulin Syringe-Needle U-100 (INSULIN SYRINGE .5CC/30GX1/2") 30G X 1/2" 0.5 ML MISC Use to inject insulin daily as prescribed 01/23/17  Virginia Crews, MD  sertraline (ZOLOFT) 50 MG tablet Take 50 mg by mouth daily.  01/19/12  [provider]     Vital Signs: BP (!) 147/68   Pulse 75   Temp 99.2 F (37.3 C) (Axillary)   Resp (!) 25   Ht 5\' 4"  (1.626 m)   Wt 280 lb 6.8 oz (127.2 kg)   LMP 07/26/2018   SpO2 99%   BMI 48.13 kg/m   Physical Exam  Constitutional: She appears well-developed and well-nourished. No distress.  Intubated.  Pulmonary/Chest: Effort normal. No respiratory distress.    Intubated.  Neurological:  Awake and alert, intubated but not sedated, breathing over vent. Speech and comprehension not assessed. PERRL bilaterally. EOMs intact bilaterally with horizontal nystagmus on right gaze, no subjective diplopia. Visual fields not assessed. No facial asymmetry. Tongue protrusion not assessed. Can wiggle bilateral toes, minimal movements of right upper extremity, no spontaneous movements of left upper extremity. Pronator drift not assessed. Fine motor and coordination not assessed. Gait not assessed. Romberg not assessed. Heel to toe not assessed. Distal pulses 2+ bilaterally.  Skin: Skin is warm and dry.  Right groin incision soft without active bleeding or hematoma.  Psychiatric:  Intubated.    Imaging: Ct Angio Head W Or [name] Contrast  Result Date: 08/15/2018 CLINICAL DATA:  Stroke follow-up EXAM: CT ANGIOGRAPHY HEAD AND NECK TECHNIQUE: Multidetector CT imaging of the head and neck was performed using the standard protocol during bolus administration of intravenous contrast. Multiplanar CT image reconstructions and MIPs were obtained to evaluate the vascular anatomy. Carotid stenosis measurements (when applicable) are obtained utilizing NASCET criteria, using the distal internal carotid diameter as the denominator. CONTRAST:  2mL ISOVUE-370 IOPAMIDOL (ISOVUE-370) INJECTION 76% COMPARISON:  CTA of the head neck from yesterday FINDINGS: CT HEAD FINDINGS Brain: Known infarct in the lower pons. There is patchy right more than left superior cerebellar infarctions not seen on prior brain MRI. Chronic small vessel ischemic change in the cerebral white matter. No hemorrhage, hydrocephalus, or mass effect. Vascular: See below. Skull: No acute finding Sinuses: Negative Orbits: Negative Review of the MIP images confirms the above findings CTA NECK FINDINGS Aortic arch: Partial coverage is negative.  Three vessel branching. Right carotid system: Mild atherosclerotic  calcification. No stenosis or ulceration. Left carotid system: Mild atherosclerotic calcification. No stenosis or ulceration. Vertebral arteries: No proximal subclavian stenosis. Left dominant vertebral artery. Both vertebral arteries are widely patent to the dura when allowing for artifact related to bolus density and patient size. Skeleton: No acute or aggressive finding. Other neck: Tracheal and esophageal intubation.  Thyromegaly. Upper chest: Negative Review of the MIP images confirms the above findings CTA HEAD FINDINGS Anterior circulation: Atheromatous irregularity of the left MCA with mild proximal M1 and more moderate bifurcation stenosis. Due to technical limitations there is limited evaluation of branches, with no major branch occlusion seen. Posterior circulation: Left vertebral artery dominance. Most of right vertebral flow is into the right PICA. There is been distal basilar to left proximal PCA stenting. The intrinsic density of the stent limits assessment of in stent patency. There is likely a degree of waisting best seen on precontrast imaging, seen at the level of the mid basilar segment. The stent is patent based on flow seen in the proximal left PCA. There is a unchanged advanced left P2 segment stenosis. Posterior communicating arteries, larger on the right. Venous sinuses: Patent Anatomic variants: None significant Delayed phase: No abnormal intracranial enhancement. These results were called by  telephone at the time of interpretation on 08/15/2018 at 9:38 am to Dr. Erlinda Hong, who verbally acknowledged these results. Review of the MIP images confirms the above findings IMPRESSION: 1. Interval basilar to left proximal PCA stenting. The density of the stent walls precludes detection of in stent stenosis; there is a degree of wasting at the mid basilar segment. There is flow in the left PCA beyond the stent such that there is presumed stent patency. 2. Known lower pontine infarct. There are new small  cerebellar infarcts since brain MRI yesterday. 3. Severe left P2 segment stenosis, also seen previously. 4. Moderate atheromatous narrowing in the proximal left MCA. Electronically Signed   By: Monte Fantasia M.D.   On: 08/15/2018 09:41   Ct Angio Neck W Or [name] Contrast  Result Date: 08/15/2018 CLINICAL DATA:  Stroke follow-up EXAM: CT ANGIOGRAPHY HEAD AND NECK TECHNIQUE: Multidetector CT imaging of the head and neck was performed using the standard protocol during bolus administration of intravenous contrast. Multiplanar CT image reconstructions and MIPs were obtained to evaluate the vascular anatomy. Carotid stenosis measurements (when applicable) are obtained utilizing NASCET criteria, using the distal internal carotid diameter as the denominator. CONTRAST:  73mL ISOVUE-370 IOPAMIDOL (ISOVUE-370) INJECTION 76% COMPARISON:  CTA of the head neck from yesterday FINDINGS: CT HEAD FINDINGS Brain: Known infarct in the lower pons. There is patchy right more than left superior cerebellar infarctions not seen on prior brain MRI. Chronic small vessel ischemic change in the cerebral white matter. No hemorrhage, hydrocephalus, or mass effect. Vascular: See below. Skull: No acute finding Sinuses: Negative Orbits: Negative Review of the MIP images confirms the above findings CTA NECK FINDINGS Aortic arch: Partial coverage is negative.  Three vessel branching. Right carotid system: Mild atherosclerotic calcification. No stenosis or ulceration. Left carotid system: Mild atherosclerotic calcification. No stenosis or ulceration. Vertebral arteries: No proximal subclavian stenosis. Left dominant vertebral artery. Both vertebral arteries are widely patent to the dura when allowing for artifact related to bolus density and patient size. Skeleton: No acute or aggressive finding. Other neck: Tracheal and esophageal intubation.  Thyromegaly. Upper chest: Negative Review of the MIP images confirms the above findings CTA HEAD  FINDINGS Anterior circulation: Atheromatous irregularity of the left MCA with mild proximal M1 and more moderate bifurcation stenosis. Due to technical limitations there is limited evaluation of branches, with no major branch occlusion seen. Posterior circulation: Left vertebral artery dominance. Most of right vertebral flow is into the right PICA. There is been distal basilar to left proximal PCA stenting. The intrinsic density of the stent limits assessment of in stent patency. There is likely a degree of waisting best seen on precontrast imaging, seen at the level of the mid basilar segment. The stent is patent based on flow seen in the proximal left PCA. There is a unchanged advanced left P2 segment stenosis. Posterior communicating arteries, larger on the right. Venous sinuses: Patent Anatomic variants: None significant Delayed phase: No abnormal intracranial enhancement. These results were called by telephone at the time of interpretation on 08/15/2018 at 9:38 am to Dr. Erlinda Hong, who verbally acknowledged these results. Review of the MIP images confirms the above findings IMPRESSION: 1. Interval basilar to left proximal PCA stenting. The density of the stent walls precludes detection of in stent stenosis; there is a degree of wasting at the mid basilar segment. There is flow in the left PCA beyond the stent such that there is presumed stent patency. 2. Known lower pontine infarct. There are new small  cerebellar infarcts since brain MRI yesterday. 3. Severe left P2 segment stenosis, also seen previously. 4. Moderate atheromatous narrowing in the proximal left MCA. Electronically Signed   By: Monte Fantasia M.D.   On: 08/15/2018 09:41   [name] Contrast  Addendum Date: 08/15/2018   ADDENDUM REPORT: 08/15/2018 10:34 ADDENDUM: Omitted impression of left facial/submandibular edema, please correlate with neck exam. Electronically Signed   By: Monte Fantasia M.D.   On: 08/15/2018 10:34   Result Date:  08/15/2018 CLINICAL DATA:  New onset dysarthria with right-sided weakness and facial droop. EXAM: MRI HEAD WITHOUT CONTRAST TECHNIQUE: Multiplanar, multiecho pulse sequences of the brain and surrounding structures were obtained without intravenous contrast. COMPARISON:  Yesterday FINDINGS: Brain: Larger bilateral pontine acute infarct, approximately doubled in size. New patchy bilateral cerebellar infarction, greater on the right. Tiny acute infarcts in the left thalamus. Negative for hemorrhage. No hydrocephalus or collection. Chronic small vessel ischemia in the cerebral white matter. Vascular: Major flow voids are preserved, including the basilar which has been stented. Skull and upper cervical spine: Negative for marrow lesion. Sinuses/Orbits: Nasopharyngeal and nasal cavity fluid in the setting of intubation. Nonspecific soft tissue swelling in the left face. IMPRESSION: 1. Progressive acute pontine infarct. New patchy bilateral cerebellar infarction. New tiny left thalamic infarcts. 2. Preserved flow void in the stented basilar. Electronically Signed: By: Monte Fantasia M.D. On: 08/15/2018 10:26   Dg Chest Port 1 View  Result Date: 08/18/2018 CLINICAL DATA:  Hypoxia EXAM: PORTABLE CHEST 1 VIEW COMPARISON:  08/15/2018 FINDINGS: Endotracheal tube with tip measuring 4 cm above the carina. Enteric tube tip is off the field of view but below the left hemidiaphragm. Shallow inspiration. Heart size and pulmonary vascularity are normal for technique. Lungs are clear. No blunting of costophrenic angles. No pneumothorax. IMPRESSION: Appliances appear in satisfactory location. No evidence of active pulmonary disease. Electronically Signed   By: Lucienne Capers M.D.   On: 08/18/2018 06:38   Portable Chest Xray  Result Date: 08/15/2018 CLINICAL DATA:  Follow-up endotracheal tube EXAM: PORTABLE CHEST 1 VIEW COMPARISON:  08/15/2018 FINDINGS: Endotracheal tube and nasogastric catheter are noted in satisfactory  position. Cardiac shadow remains enlarged. The lungs are well aerated bilaterally. Mild bibasilar atelectasis is noted. No sizable effusion is seen. No bony abnormality is noted. IMPRESSION: Tubes and lines as described above. Mild bibasilar atelectasis. Electronically Signed   By: Inez Catalina M.D.   On: 08/15/2018 10:10   Portable Chest X-ray  Result Date: 08/19/2018 CLINICAL DATA:  Encounter for intubation EXAM: PORTABLE CHEST 1 VIEW COMPARISON:  08/24/2018 FINDINGS: Endotracheal tube has been placed, tip approximately 3.4 centimeters above the carina. The heart is enlarged. There is mild pulmonary vascular congestion. Question of developing mild perihilar edema versus perihilar infiltrate. The stomach is distended by air. IMPRESSION: Interval placement of endotracheal tube. Question developing perihilar edema or infectious process. Gaseous distension of the stomach. Electronically Signed   By: Nolon Nations M.D.   On: 08/06/2018 22:00   Dg Chest Port 1 View  Result Date: 08/11/2018 CLINICAL DATA:  Respiratory insufficiency, history of hypertension and diabetes EXAM: PORTABLE CHEST 1 VIEW COMPARISON:  Chest x-ray of 08/13/2016 FINDINGS: The pulmonary vascular congestion appears to have resolved. No pneumonia or effusion is seen. Mediastinal and hilar contours are unchanged and cardiomegaly is stable. No bony abnormality is seen. IMPRESSION: 1. Resolution of previously noted pulmonary vascular congestion. 2. Stable cardiomegaly. Electronically Signed   By: Ivar Drape M.D.   On: 09/02/2018 17:26  Dg Abd Portable 1v  Result Date: 08/09/2018 CLINICAL DATA:  OG tube placement EXAM: PORTABLE ABDOMEN - 1 VIEW COMPARISON:  CT abdomen and pelvis 01/11/2017 FINDINGS: Enteric tube is present with multiple coils in the left upper quadrant and tip at the midline. Location of the tip is consistent with the distal stomach. Paucity of gas in the abdomen. IMPRESSION: Enteric tube coils in the left upper  quadrant with tip in the distal stomach. Electronically Signed   By: Lucienne Capers M.D.   On: 08/30/2018 23:31    Labs:  CBC: Recent Labs    08/15/18 0512 08/16/18 0500 08/17/18 0502 08/18/18 0626  WBC 11.1* 9.6 11.3* 10.8*  HGB 9.7* 9.4* 9.3* 9.3*  HCT 32.4* 31.0* 32.0* 31.8*  PLT 409* 381 357 393    COAGS: Recent Labs    08/13/2018 1028 08/18/18 0626  INR 1.02 1.12  APTT  --  29    BMP: Recent Labs    08/16/18 1423 08/17/18 0502 08/17/18 1243 08/18/18 0626  NA 136 140 139 143  K 3.9 3.8 4.0 3.3*  CL 113* 113* 116* 116*  CO2 18* 18* 15* 21*  GLUCOSE 209* 196* 215* 206*  BUN 20 26* 27* 28*  CALCIUM 9.5 10.1 10.1 10.4*  CREATININE 1.16* 1.03* 1.01* 0.95  GFRNONAA 51* 59* >60 >60  GFRAA 59* >60 >60 >60    LIVER FUNCTION TESTS: Recent Labs    01/23/18 1352  BILITOT 0.8  AST 14*  ALT 11*  ALKPHOS 92  PROT 7.8  ALBUMIN 3.0*    Assessment and Plan:  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 Estanislado Pandy. Patient's condition stable- remains intubated, can wiggle bilateral toes, minimal movements of right upper extremity, no spontaneous movements of left upper extremity. Right groin incision stable. Continue taking Brilinta 90 mg twice daily and Aspirin 81 mg once daily. Appreciate and agree with neurology management. Please call IR with questions/concerns.   Electronically Signed: Earley Abide, PA-C 08/18/2018, 12:00 PM   I spent a total of 15 Minutes at the the patient's bedside AND on the patient's hospital floor or unit, greater than 50% of which was counseling/coordinating care for distal basilar artery stenosis s/p stent assisted angioplasty.

## 2018-08-18 NOTE — Progress Notes (Signed)
eLink Physician-Brief Progress Note Patient Name: MILAGRO BELMARES DOB: [birth year] MRN: [id number]   Date of Service  08/18/2018  HPI/Events of Note  Hemoptysis and hypoxia - Patient now synchronous with ventilator.   eICU Interventions  Will order: 1. Albuterol Nebs as already ordered.  2. Saline lavage until hemoptysis clears.  4. PT/INR and PTT now.  5. Portable CXR now. 6. Increase PEEP to 8.      Intervention Category Major Interventions: Hypoxemia - evaluation and management  Sommer,Steven Eugene 08/18/2018, 5:20 AM

## 2018-08-18 NOTE — Progress Notes (Signed)
NAME:  Andrea [name], MRN:  [id number], DOB:  13-May-1961, LOS: 4 ADMISSION DATE:  08/12/2018, CONSULTATION DATE:  08/25/2018 REFERRING MD:  Dr. Rory Percy, CHIEF COMPLAINT:  CP/ Acute CVA  Brief History   65 yoF w/poorly controlled HTN and IDDM presenting with CP and left arm pain w/BP 220/117. Initial CTH neg.  Cardiac workup negative for acute event thus far, cardiology following.  Code stroke initiated for R hemiplegia, dysarthria and right facial droop.  Out of window for TPA.  Requiring cleviprex for BP control.  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  Evolving pontine stroke on MRI. Intubated for neuro IR s/p stenting and angioplasty with 80-90% patency.  Extubated post procedure but due to insufficency placed on BiPAP and returns to ICU.    Past Medical History  HTN, IDDM, hypothyroidism s/p ablation  Significant Hospital Events   10/9 present to [facility] ED -transferred to [facility] 10/9 basilar artery stenting 10/10 CTA for declining exam - evolving pontine infarct, stent deemed to be patent  Consults: date of consult/date signed off & final recs:  Cards 10/10 Neurology 10/10 PCCM 10/10  Procedures (surgical and bedside):  10/10 Neuro IR >> s/p stent and angioplasty of distal basilar artery  10/10 ETT for procedure 10/10 ETT (reintubated) >>  10/10 L R art line >>  Significant Diagnostic Tests:  08/25/2018  CTH >>  No acute intracranial abnormalities. Mild white matter changes likely due to small vessel ischemia  08/1018 CTH >> 1. No acute abnormality and no change from earlier today 2. ASPECTS is 10  08/13/2018  CTA head and neck >>  1. 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 No definite acute thrombus identified. 2. Severe stenosis left posterior cerebral artery and moderate stenosis right posterior cerebral artery. 3. Mild stenosis left MCA and moderate stenosis left MCA bifurcation. 4. No  significant carotid or vertebral artery stenosis in the neck.  08/24/2018 MRI - evolving pontine infarct.  08/15/18  Echocardiogram - normal LVSF with severe LVH.   Micro Data:  08/10/2018 MRSA PCR >>negative  Antimicrobials:  10/10 cefazolin preop  Subjective:  2 episodes of blood-tinged sputum, bright red; overnight last night and early this morning She had some respiratory distress, was dyssynchronous with PRVC at the time.  Chest x-ray was unrevealing.  PEEP was increased 8 transiently  Objective   Blood pressure (!) 147/68, pulse 75, temperature 99.2 F (37.3 C), temperature source Axillary, resp. rate (!) 25, height 5\' 4"  (1.626 m), weight 127.2 kg, last menstrual period 07/26/2018, SpO2 99 %.    Vent Mode: PSV FiO2 (%):  [30 %] 30 % Set Rate:  [15 bmp] 15 bmp Vt Set:  [430 mL] 430 mL PEEP:  [5 cmH20-8 cmH20] 5 cmH20 Pressure Support:  [8 cmH20-10 cmH20] 8 cmH20 Plateau Pressure:  [11 cmH20-14 cmH20] 14 cmH20   Intake/Output Summary (Last 24 hours) at 08/18/2018 1209 Last data filed at 08/18/2018 1000 Gross per 24 hour  Intake 1660.58 ml  Output 3725 ml  Net -2064.42 ml   Filed Weights   08/16/18 0500 08/17/18 0600 08/18/18 0500  Weight: 129.1 kg 131.4 kg 127.2 kg    Examination: General: Obese woman, ventilated HEENT: Endotracheal tube, OG tube in place.  No oral lesions.  She does have some pink to red thin secretions in her ventilator circuit Neuro: Awake but less so than 10/13.  She did not to questions.  She  did not move her extremities on command for me today. PULM: Distant, clear bilaterally.  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  Resulting in inadequate airway protection,  difficulty following commands in the extremities.  The latter is starting to improve -Strict blood pressure control, SBP < 180 -Continue aspirin and Brilinta for now, may need to reconsider if change in frequency, degree of hemoptysis -Managing hyperglycemia as below -Minimize sedation.  Stop fentanyl drip and convert back to fentanyl pushes as needed  Respiratory Insufficiency due to inability to protect airway from dysarthria.  Expected with location of stroke. -Continue pressure support ad lib. -She likely deserves an opportunity for extubation in order to better assess her airway protection before we would commit to tracheostomy.  I will discuss with neurology and determine whether they have any reservations about an extubation attempt either today or tomorrow.  Hypertensive Crisis -Cleviprex off, continue current regimen  Microcytic Anemia -Follow CBC - transfusion threshold Hgb < 7.0  Hemoptysis.  Self-limited, suspect this is due to suction trauma while on antiplatelet therapy. -Chest x-ray reassuring -Try to minimize suctioning as much as possible -If recurs then may merit bronchoscopy and airway inspection.  Type 2 diabetes -Per diabetes coronary recommendations we will increase Lantus to 12 units, and 4 units regular insulin every 4 hours for TF coverage.  Hypothyroidism, TSH 1.34 -Does not appear to be on home medication for this, unclear that its indicated at this time  Nutrition -Continue tube feeding  Disposition / Summary of Today's Plan 08/18/18   Continue current ventilation.  Airway protection will be the issue.  Likely attempt extubation again either 10/14 or 10/15.  If she fails then proceed with tracheostomy    Diet: TF Pain/Anxiety/Delirium protocol (if indicated): N/a VAP protocol (if indicated): no DVT prophylaxis: SCDs  GI prophylaxis: Famotidine Hyperglycemia protocol: SSI, lantus Mobility: bedrest Code Status: Full  Family Communication: Updated  the patient's sister and niece at bedside on 10/14  Labs ( personally  Reviewed)  CBC: Recent Labs  Lab 08/18/2018 1028 08/15/18 0512 08/16/18 0500 08/17/18 0502 08/18/18 0626  WBC 9.4 11.1* 9.6 11.3* 10.8*  NEUTROABS 5.2 9.1*  --   --   --   HGB 11.1* 9.7* 9.4* 9.3* 9.3*  HCT 36.0 32.4* 31.0* 32.0* 31.8*  MCV 76.9* 77.1* 78.1* 78.8* 78.7*  PLT 418* 409* 381 357 272    Basic Metabolic Panel: Recent Labs  Lab 09/01/2018 2015  08/15/18 1633 08/16/18 0500 08/16/18 1423 08/17/18 0502 08/17/18 1243 08/18/18 0626  NA  --    < >  --  137 136 140 139 143  K  --    < >  --  3.7 3.9 3.8 4.0 3.3*  CL  --    < >  --  111 113* 113* 116* 116*  CO2  --    < >  --  18* 18* 18* 15* 21*  GLUCOSE  --    < >  --  205* 209* 196* 215* 206*  BUN  --    < >  --  17 20 26* 27* 28*  CREATININE  --    < >  --  1.13* 1.16* 1.03* 1.01* 0.95  CALCIUM  --    < >  --  9.7 9.5 10.1 10.1 10.4*  MG 1.4*  --  1.7 1.8 1.6* 1.7  --   --   PHOS 3.3  --  3.6 3.2 3.3 3.3  --   --    < > = values in this interval not displayed.   GFR: Estimated Creatinine Clearance: 86.3 mL/min (by C-G formula based on SCr of 0.95 mg/dL). Recent Labs  Lab 08/15/18 0512 08/16/18 0500 08/17/18 0502 08/18/18 0626  WBC 11.1* 9.6 11.3* 10.8*    Liver Function Tests: No results for input(s): AST, ALT, ALKPHOS, BILITOT, PROT, ALBUMIN in the last 168 hours. No results for input(s): LIPASE, AMYLASE in the last 168 hours. No results for input(s): AMMONIA in the last 168 hours.  ABG    Component Value Date/Time   PHART 7.334 (L) 09/02/2018 2259   PCO2ART 41.9 08/31/2018 2259   PO2ART 275.0 (H) 09/03/2018 2259   HCO3 22.3 08/22/2018 2259   TCO2 24 09/04/2018 2259   ACIDBASEDEF 3.0 (H) 08/28/2018 2259   O2SAT 100.0 09/04/2018 2259     Coagulation Profile: Recent Labs  Lab 08/10/2018 1028 08/18/18 0626  INR 1.02 1.12    Cardiac Enzymes: Recent Labs  Lab 08/16/2018 0317 08/07/2018 2015  TROPONINI 0.03* <0.03     HbA1C: Hgb A1c MFr Bld  Date/Time Value Ref Range Status  08/15/2018 05:12 AM 11.4 (H) 4.8 - 5.6 % Final    Comment:    (NOTE) Pre diabetes:          5.7%-6.4% Diabetes:              >6.4% Glycemic control for   <7.0% adults with diabetes   01/12/2017 02:43 AM 12.8 (H) 4.8 - 5.6 % Final    Comment:    (NOTE)         Pre-diabetes: 5.7 - 6.4         Diabetes: >6.4         Glycemic control for adults with diabetes: <7.0     CBG: Recent Labs  Lab 08/17/18 1956 08/17/18 2304 08/18/18 0308 08/18/18 0817 08/18/18 1158  GLUCAP 220* 209* 177* 180* 253*   CRITICAL CARE Performed by: Collene Gobble  Independent CC time 43 minutes  Baltazar Apo, MD, PhD 08/18/2018, 12:09 PM [facility] [phone] or if no answer [phone]

## 2018-08-18 NOTE — Progress Notes (Signed)
FPTS Interim Progress Note  S: Went to see patient in the ICU as I am her PCP. Currently being followed by Critical care for respiratory support after suffering basilar stroke s/p stent placement on . Appears to be improving somewhat per chart review. Family medicine will follow at this time and resume care when she returns to the floor.   O: BP (!) 142/75   Pulse 77   Temp 99.3 F (37.4 C) (Axillary)   Resp (!) 32   Ht 5\' 4"  (1.626 m)   Wt 131.4 kg   LMP 07/26/2018   SpO2 100%   BMI 49.72 kg/m   Gen: Patient alert during exam but nonverbal due to trach Neuro: spontaneous movement of LE bilaterally observed during visit   A/P: Will continue to follow at this time  Danna Hefty, DO 08/18/2018, 1:02 AM PGY-1, [facility] pager [phone]

## 2018-08-19 ENCOUNTER — Inpatient Hospital Stay (HOSPITAL_COMMUNITY): Payer: Medicaid Other

## 2018-08-19 LAB — GLUCOSE, CAPILLARY
GLUCOSE-CAPILLARY: 241 mg/dL — AB (ref 70–99)
GLUCOSE-CAPILLARY: 265 mg/dL — AB (ref 70–99)
GLUCOSE-CAPILLARY: 271 mg/dL — AB (ref 70–99)
GLUCOSE-CAPILLARY: 278 mg/dL — AB (ref 70–99)
Glucose-Capillary: 241 mg/dL — ABNORMAL HIGH (ref 70–99)
Glucose-Capillary: 266 mg/dL — ABNORMAL HIGH (ref 70–99)

## 2018-08-19 LAB — BASIC METABOLIC PANEL
ANION GAP: 10 (ref 5–15)
BUN: 36 mg/dL — ABNORMAL HIGH (ref 6–20)
CALCIUM: 11.5 mg/dL — AB (ref 8.9–10.3)
CO2: 20 mmol/L — AB (ref 22–32)
CREATININE: 1.07 mg/dL — AB (ref 0.44–1.00)
Chloride: 116 mmol/L — ABNORMAL HIGH (ref 98–111)
GFR calc non Af Amer: 57 mL/min — ABNORMAL LOW (ref >60)
GLUCOSE: 276 mg/dL — AB (ref 70–99)
POTASSIUM: 3.4 mmol/L — AB (ref 3.5–5.1)
Sodium: 146 mmol/L — ABNORMAL HIGH (ref 135–145)

## 2018-08-19 LAB — MAGNESIUM: Magnesium: 2.1 mg/dL (ref 1.7–2.4)

## 2018-08-19 LAB — CBC
HEMATOCRIT: 33.8 % — AB (ref 36.0–46.0)
Hemoglobin: 9.6 g/dL — ABNORMAL LOW (ref 12.0–15.0)
MCH: 22.8 pg — ABNORMAL LOW (ref 26.0–34.0)
MCHC: 28.4 g/dL — ABNORMAL LOW (ref 30.0–36.0)
MCV: 80.3 fL (ref 80.0–100.0)
NRBC: 0 % (ref 0.0–0.2)
Platelets: 419 10*3/uL — ABNORMAL HIGH (ref 150–400)
RBC: 4.21 MIL/uL (ref 3.87–5.11)
RDW: 15.9 % — AB (ref 11.5–15.5)
WBC: 11 10*3/uL — ABNORMAL HIGH (ref 4.0–10.5)

## 2018-08-19 LAB — HEPARIN LEVEL (UNFRACTIONATED): HEPARIN UNFRACTIONATED: 0.12 [IU]/mL — AB (ref 0.30–0.70)

## 2018-08-19 MED ORDER — HEPARIN (PORCINE) IN NACL 100-0.45 UNIT/ML-% IJ SOLN
800.0000 [IU]/h | INTRAMUSCULAR | Status: AC
  Administered 2018-08-19 – 2018-08-20 (×2): 650 [IU]/h via INTRAVENOUS
  Administered 2018-08-22 – 2018-08-24 (×2): 850 [IU]/h via INTRAVENOUS
  Filled 2018-08-19 (×4): qty 250

## 2018-08-19 MED ORDER — DOCUSATE SODIUM 50 MG/5ML PO LIQD
100.0000 mg | Freq: Every day | ORAL | Status: DC
  Administered 2018-08-19 – 2018-09-25 (×30): 100 mg
  Filled 2018-08-19 (×32): qty 10

## 2018-08-19 MED ORDER — METOPROLOL TARTRATE 25 MG/10 ML ORAL SUSPENSION
75.0000 mg | Freq: Two times a day (BID) | ORAL | Status: DC
  Administered 2018-08-19: 75 mg
  Filled 2018-08-19 (×2): qty 30

## 2018-08-19 MED ORDER — PANTOPRAZOLE SODIUM 40 MG PO TBEC: 40.0000 mg | DELAYED_RELEASE_TABLET | Freq: Every day | ORAL | Status: DC

## 2018-08-19 MED ORDER — FENTANYL CITRATE (PF) 100 MCG/2ML IJ SOLN
50.0000 ug | INTRAMUSCULAR | Status: DC | PRN
  Administered 2018-08-19 – 2018-08-21 (×10): 50 ug via INTRAVENOUS
  Filled 2018-08-19 (×9): qty 2

## 2018-08-19 MED ORDER — BISACODYL 10 MG RE SUPP: 10.0000 mg | Freq: Every day | RECTAL | Status: DC | PRN

## 2018-08-19 MED ORDER — SENNOSIDES 8.8 MG/5ML PO SYRP
5.0000 mL | ORAL_SOLUTION | Freq: Every evening | ORAL | Status: DC | PRN
  Administered 2018-09-19: 5 mL
  Filled 2018-08-19: qty 5

## 2018-08-19 MED ORDER — INSULIN GLARGINE 100 UNIT/ML ~~LOC~~ SOLN
15.0000 [IU] | Freq: Two times a day (BID) | SUBCUTANEOUS | Status: DC
  Administered 2018-08-19: 15 [IU] via SUBCUTANEOUS
  Filled 2018-08-19 (×2): qty 0.15

## 2018-08-19 NOTE — Plan of Care (Signed)
Pt is tolerating TF at ordered rate, pain is controlled with PRN fentanyl

## 2018-08-19 NOTE — Progress Notes (Signed)
[place] for Heparin Indication: Intracerebral stent  Allergies  Allergen Reactions  . Tramadol     "Makes me sees things and I just dont act right"    Patient Measurements: Height: 5\' 4"  (162.6 cm) Weight: 280 lb 6.8 oz (127.2 kg) IBW/kg (Calculated) : 54.7 Heparin Dosing Weight: 84.8 kg  Vital Signs: Temp: 99.9 F (37.7 C) (10/15 1600) Temp Source: Axillary (10/15 1600) BP: 166/77 (10/15 1900) Pulse Rate: 97 (10/15 1900)  Labs: Recent Labs    08/17/18 0502 08/17/18 1243 08/18/18 0626 08/19/18 0718 08/19/18 1828  HGB 9.3*  --  9.3* 9.6*  --   HCT 32.0*  --  31.8* 33.8*  --   PLT 357  --  393 419*  --   APTT  --   --  29  --   --   LABPROT  --   --  14.3  --   --   INR  --   --  1.12  --   --   HEPARINUNFRC  --   --   --   --  0.12*  CREATININE 1.03* 1.01* 0.95 1.07*  --     Estimated Creatinine Clearance: 76.6 mL/min (A) (by C-G formula based on SCr of 1.07 mg/dL (H)).    Assessment: 57 yo female with recent intracerebral stent currently on Brilinta and Aspirin therapy.  Patient needs to be bridged off of Brilinta for a trach and PEG.  In discussion with Dr. Estanislado Pandy and Dr. Lamonte Sakai, we will start low-dose Heparin therapy, but continue aspirin therapy.  Initial heparin level = 0.12  Goal of Therapy:  Heparin level 0.1 - 0.25 units/ml Monitor platelets by anticoagulation protocol: Yes   Plan:  Continue heparin at 650 units / hr Follow up AM labs  Thank you Anette Guarneri, PharmD [phone]   08/19/2018, 7:39 PM

## 2018-08-19 NOTE — Progress Notes (Signed)
ANTICOAGULATION CONSULT NOTE - Initial Consult  Pharmacy Consult for Heparin Indication: Intracerebral stent  Allergies  Allergen Reactions  . Tramadol     "Makes me sees things and I just dont act right"    Patient Measurements: Height: 5\' 4"  (162.6 cm) Weight: 280 lb 6.8 oz (127.2 kg) IBW/kg (Calculated) : 54.7 Heparin Dosing Weight: 84.8 kg  Vital Signs: Temp: 99.4 F (37.4 C) (10/15 0800) Temp Source: Axillary (10/15 0800) BP: 197/78 (10/15 0950) Pulse Rate: 67 (10/15 0800)  Labs: Recent Labs    08/17/18 0502 08/17/18 1243 08/18/18 0626 08/19/18 0718  HGB 9.3*  --  9.3* 9.6*  HCT 32.0*  --  31.8* 33.8*  PLT 357  --  393 419*  APTT  --   --  29  --   LABPROT  --   --  14.3  --   INR  --   --  1.12  --   CREATININE 1.03* 1.01* 0.95 1.07*    Estimated Creatinine Clearance: 76.6 mL/min (A) (by C-G formula based on SCr of 1.07 mg/dL (H)).   Medical History: Past Medical History:  Diagnosis Date  . Diabetes mellitus   . Environmental allergies   . Fibroid   . High cholesterol   . Hx of cardiovascular stress test    a. ETT-MV 2/14:  EF 46% (normal by echo), low risk, no ischemia or scar  . Hx of echocardiogram    Echo 2/14: mild LVH, EF 55-60%, Gr 1 diast dysfn, mild LAE  . Hypertension   . Hyperthyroidism    Radioactive iodine ablation    Assessment: 57 yo female with recent intracerebral stent currently on Brilinta and Aspirin therapy.  Patient needs to be bridged off of Brilinta for a trach and PEG.  In discussion with Dr. Estanislado Pandy and Dr. Lamonte Sakai, we will start low-dose Heparin therapy, but continue aspirin therapy.  Goal of Therapy:  Heparin level 0.1 - 0.25 units/ml Monitor platelets by anticoagulation protocol: Yes   Plan:   Start heparin infusion at 650 units/hr Check anti-Xa level in 6 hours and daily while on heparin Continue to monitor H&H and platelets   Alanda Slim, PharmD, [facility] Clinical Pharmacist Please see AMION for all  Pharmacists' Contact Phone Numbers 08/19/2018, 10:26 AM

## 2018-08-19 NOTE — Progress Notes (Signed)
Inpatient Diabetes Program Recommendations  AACE/ADA: New Consensus Statement on Inpatient Glycemic Control (2015)  Target Ranges:  Prepandial:   less than 140 mg/dL      Peak postprandial:   less than 180 mg/dL (1-2 hours)      Critically ill patients:  140 - 180 mg/dL   Results for Andrea [name], Andrea [name] (MRN [id number]) as of 08/19/2018 11:47  Ref. Range 08/18/2018 08:17 08/18/2018 11:58 08/18/2018 15:45 08/18/2018 19:49 08/18/2018 23:51 08/19/2018 03:30 08/19/2018 08:16  Glucose-Capillary Latest Ref Range: 70 - 99 mg/dL 180 (H) Novolog 4 units given 253 (H) Novolog 11 units Given  284 (H) Novolog 15 units given 285 (H) Novolog 15 units given 285 (H) Novolog 15 units given 241 (H) Novolog 11 units given 265 (H) Novolog 15 units given    Diabetes history: Type 2 DM Outpatient Diabetes medications: Lantus 27 units QHS, Metformin 1000 mg BID Current orders for Inpatient glycemic control: Lantus 10 units QD, Novolog 0-20 units Q4H  Inpatient Diabetes Program Recommendations:    Lantus 12 units given today.   Tube feed coverage started yesterday. Consider increasing Tube Feed Coverage to 6-8 units Q 4 hours.  Thanks, Tama Headings RN, MSN, BC-ADM Inpatient Diabetes Coordinator Team Pager [phone] (8a-5p)

## 2018-08-19 NOTE — Progress Notes (Signed)
NAME:  Andrea [name], MRN:  [id number], DOB:  January 26, 1961, LOS: 5 ADMISSION DATE:  09/02/2018, CONSULTATION DATE:  08/21/2018 REFERRING MD:  Dr. Rory Percy, CHIEF COMPLAINT:  CP/ Acute CVA  Brief History   70 yoF w/poorly controlled HTN and IDDM presenting with CP and left arm pain w/BP 220/117. Initial CTH neg.  Cardiac workup negative for acute event thus far, cardiology following.  Code stroke initiated for R hemiplegia, dysarthria and right facial droop.  Out of window for TPA.  Requiring cleviprex for BP control.  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  Evolving pontine stroke on MRI. Intubated for neuro IR s/p stenting and angioplasty with 80-90% patency.  Extubated post procedure but due to insufficency placed on BiPAP and returns to ICU.    Past Medical History  HTN, IDDM, hypothyroidism s/p ablation  Significant Hospital Events   10/9 present to [facility] ED -transferred to [facility] 10/9 basilar artery stenting 10/10 CTA for declining exam - evolving pontine infarct, stent deemed to be patent  Consults: date of consult/date signed off & final recs:  Cards 10/10 Neurology 10/10 PCCM 10/10  Procedures (surgical and bedside):  10/10 Neuro IR >> s/p stent and angioplasty of distal basilar artery  10/10 ETT for procedure 10/10 ETT (reintubated) >>  10/10 L R art line >>  Significant Diagnostic Tests:  08/25/2018  CTH >>  No acute intracranial abnormalities. Mild white matter changes likely due to small vessel ischemia  08/1018 CTH >> 1. No acute abnormality and no change from earlier today 2. ASPECTS is 10  08/16/2018  CTA head and neck >>  1. 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 No definite acute thrombus identified. 2. Severe stenosis left posterior cerebral artery and moderate stenosis right posterior cerebral artery. 3. Mild stenosis left MCA and moderate stenosis left MCA bifurcation. 4. No  significant carotid or vertebral artery stenosis in the neck.  09/02/2018 MRI - evolving pontine infarct.  08/15/18  Echocardiogram - normal LVSF with severe LVH.   Micro Data:  08/24/2018 MRSA PCR >>negative  Antimicrobials:  10/10 cefazolin preop  Subjective:  No further hemoptysis reported Currently on PS 5  Objective   Blood pressure (!) 160/74, pulse 67, temperature 99.4 F (37.4 C), temperature source Axillary, resp. rate (!) 24, height 5\' 4"  (1.626 m), weight 127.2 kg, last menstrual period 07/26/2018, SpO2 100 %.    Vent Mode: CPAP;PSV FiO2 (%):  [30 %] 30 % Set Rate:  [15 bmp] 15 bmp Vt Set:  [430 mL] 430 mL PEEP:  [5 cmH20] 5 cmH20 Pressure Support:  [5 EHU31-4 cmH20] 5 cmH20 Plateau Pressure:  [9 cmH20] 9 cmH20   Intake/Output Summary (Last 24 hours) at 08/19/2018 0930 Last data filed at 08/19/2018 0600 Gross per 24 hour  Intake 1500.04 ml  Output 1500 ml  Net 0.04 ml   Filed Weights   08/17/18 0600 08/18/18 0500 08/19/18 0615  Weight: 131.4 kg 127.2 kg 127.2 kg    Examination: General: Obese woman, ventilated HEENT: Endotracheal tube in place, OG tube in place, no oral lesions Neuro: Wakes easily to voice, able to nod to questions.  She was unable to move either upper or lower extremity today. PULM: Distant, decreased at bilateral bases, otherwise clear CV: Distant, regular, no murmur GI: Obese, soft, nondistended, positive bowel sounds Extremities: 1+ upper extremity edema, trace bilateral pretibial edema Skin: No rash  Resolved Hospital Problem list  Assessment & Plan:   Acute ischemic pontine stroke.  Resulting in inadequate airway protection, now inability to follow commands in the extremities.   Stent placement interval basilar to left proximal PCA -Strict blood pressure control, goal SBP < 180 -Change anticoagulation from Brilinta to heparin plus aspirin to facilitate trach placement.  No further evidence hemoptysis noted -Managing  hyperglycemia as below -Minimize sedation as able  Respiratory Insufficiency due to inability to protect airway from dysarthria.  Expected with location of stroke. -Continue pressure support ventilation -Discussed her risks for upper airway protection, obstruction, with neurology.  Given her no neurological deficits chances for adequate airway protection are low.  In this setting should probably move straight to tracheostomy.  We will need to get her off Brilinta in order to arrange.  Likely synchronized with PEG placement since we have to work around her anticoagulation.  Hypertensive Crisis -Continue current regimen, Cleviprex is off  Microcytic Anemia -Follow CBC - transfusion threshold Hgb < 7.0  Hemoptysis.  Self-limited, suspect this is due to suction trauma while on antiplatelet therapy. -Intermittent chest x-ray -Try to minimize suctioning as much as possible -If recurs then we will arrange for bronchoscopy and airway inspection  Type 2 diabetes -Per diabetes coronary recommendations: Increase Lantus to 12 units, added to peak coverage 4 units regular every 4 hours  Hypothyroidism, TSH 1.34 -Carries this diagnosis but TSH is reassuring, not currently on medications  Nutrition -Tube feeding as ordered -Will likely require PEG tube placement, synchronized with trach placement since we have to coordinate anticoagulation  Disposition / Summary of Today's Plan 08/19/18   Continue current supportive care Change Brilinta to heparin Determine timing for tracheostomy and PEG placement    Diet: TF Pain/Anxiety/Delirium protocol (if indicated): N/a VAP protocol (if indicated): no DVT prophylaxis: SCDs  GI prophylaxis: Famotidine Hyperglycemia protocol: SSI, lantus Mobility: bedrest Code Status: Full  Family Communication: Updated the patient's sister and niece at bedside on 10/14  Labs ( personally  Reviewed)  CBC: Recent Labs  Lab 08/21/2018 1028 08/15/18 0512  08/16/18 0500 08/17/18 0502 08/18/18 0626 08/19/18 0718  WBC 9.4 11.1* 9.6 11.3* 10.8* 11.0*  NEUTROABS 5.2 9.1*  --   --   --   --   HGB 11.1* 9.7* 9.4* 9.3* 9.3* 9.6*  HCT 36.0 32.4* 31.0* 32.0* 31.8* 33.8*  MCV 76.9* 77.1* 78.1* 78.8* 78.7* 80.3  PLT 418* 409* 381 357 393 419*    Basic Metabolic Panel: Recent Labs  Lab 08/08/2018 2015  08/15/18 1633 08/16/18 0500 08/16/18 1423 08/17/18 0502 08/17/18 1243 08/18/18 0626 08/19/18 0718  NA  --    < >  --  137 136 140 139 143 146*  K  --    < >  --  3.7 3.9 3.8 4.0 3.3* 3.4*  CL  --    < >  --  111 113* 113* 116* 116* 116*  CO2  --    < >  --  18* 18* 18* 15* 21* 20*  GLUCOSE  --    < >  --  205* 209* 196* 215* 206* 276*  BUN  --    < >  --  17 20 26* 27* 28* 36*  CREATININE  --    < >  --  1.13* 1.16* 1.03* 1.01* 0.95 1.07*  CALCIUM  --    < >  --  9.7 9.5 10.1 10.1 10.4* 11.5*  MG 1.4*  --  1.7 1.8 1.6* 1.7  --   --  2.1  PHOS 3.3  --  3.6 3.2 3.3 3.3  --   --   --    < > = values in this interval not displayed.   GFR: Estimated Creatinine Clearance: 76.6 mL/min (A) (by C-G formula based on SCr of 1.07 mg/dL (H)). Recent Labs  Lab 08/16/18 0500 08/17/18 0502 08/18/18 0626 08/19/18 0718  WBC 9.6 11.3* 10.8* 11.0*    Liver Function Tests: No results for input(s): AST, ALT, ALKPHOS, BILITOT, PROT, ALBUMIN in the last 168 hours. No results for input(s): LIPASE, AMYLASE in the last 168 hours. No results for input(s): AMMONIA in the last 168 hours.  ABG    Component Value Date/Time   PHART 7.334 (L) 08/11/2018 2259   PCO2ART 41.9 08/11/2018 2259   PO2ART 275.0 (H) 08/13/2018 2259   HCO3 22.3 08/28/2018 2259   TCO2 24 08/25/2018 2259   ACIDBASEDEF 3.0 (H) 08/27/2018 2259   O2SAT 100.0 08/11/2018 2259     Coagulation Profile: Recent Labs  Lab 08/20/2018 1028 08/18/18 0626  INR 1.02 1.12    Cardiac Enzymes: Recent Labs  Lab 08/30/2018 0317 08/19/2018 2015  TROPONINI 0.03* <0.03    HbA1C: Hgb A1c MFr Bld   Date/Time Value Ref Range Status  08/15/2018 05:12 AM 11.4 (H) 4.8 - 5.6 % Final    Comment:    (NOTE) Pre diabetes:          5.7%-6.4% Diabetes:              >6.4% Glycemic control for   <7.0% adults with diabetes   01/12/2017 02:43 AM 12.8 (H) 4.8 - 5.6 % Final    Comment:    (NOTE)         Pre-diabetes: 5.7 - 6.4         Diabetes: >6.4         Glycemic control for adults with diabetes: <7.0     CBG: Recent Labs  Lab 08/18/18 1545 08/18/18 1949 08/18/18 2351 08/19/18 0330 08/19/18 0816  GLUCAP 284* 285* 285* 241* 265*    Independent CC time 31 minutes  Baltazar Apo, MD, PhD 08/19/2018, 9:38 AM [facility] [phone] or if no answer [phone]

## 2018-08-19 NOTE — Progress Notes (Signed)
Wasted 168ml of fentanyl gtt in sink with Lovenia Kim, RN.

## 2018-08-19 NOTE — Progress Notes (Signed)
STROKE TEAM PROGRESS NOTE   SUBJECTIVE (INTERVAL HISTORY) Her family is not  at the bedside.  Pt neuro stable overnight.   She is awake, alert, following commands and hemodynamically stable. Family seems agreeable to early trach and PEG. OBJECTIVE Vitals:   08/19/18 1000 08/19/18 1100 08/19/18 1200 08/19/18 1218  BP: (!) 163/77 (!) 148/78 (!) 181/84 (!) 155/77  Pulse: 85 76 86 76  Resp: (!) 29 (!) 25 (!) 31 (!) 21  Temp:   99.8 F (37.7 C)   TempSrc:   Axillary   SpO2: 97% 100% 100% 99%  Weight:      Height:        CBC:  Recent Labs  Lab 08/07/2018 1028 08/15/18 0512  08/18/18 0626 08/19/18 0718  WBC 9.4 11.1*   < > 10.8* 11.0*  NEUTROABS 5.2 9.1*  --   --   --   HGB 11.1* 9.7*   < > 9.3* 9.6*  HCT 36.0 32.4*   < > 31.8* 33.8*  MCV 76.9* 77.1*   < > 78.7* 80.3  PLT 418* 409*   < > 393 419*   < > = values in this interval not displayed.    Basic Metabolic Panel:  Recent Labs  Lab 08/16/18 1423 08/17/18 0502  08/18/18 0626 08/19/18 0718  NA 136 140   < > 143 146*  K 3.9 3.8   < > 3.3* 3.4*  CL 113* 113*   < > 116* 116*  CO2 18* 18*   < > 21* 20*  GLUCOSE 209* 196*   < > 206* 276*  BUN 20 26*   < > 28* 36*  CREATININE 1.16* 1.03*   < > 0.95 1.07*  CALCIUM 9.5 10.1   < > 10.4* 11.5*  MG 1.6* 1.7  --   --  2.1  PHOS 3.3 3.3  --   --   --    < > = values in this interval not displayed.    Lipid Panel:     Component Value Date/Time   CHOL 256 (H) 08/15/2018 0512   TRIG 248 (H) 08/15/2018 0512   HDL 35 (L) 08/15/2018 0512   CHOLHDL 7.3 08/15/2018 0512   VLDL 50 (H) 08/15/2018 0512   LDLCALC 171 (H) 08/15/2018 0512   HgbA1c:  Lab Results  Component Value Date   HGBA1C 11.4 (H) 08/15/2018   Urine Drug Screen:     Component Value Date/Time   LABOPIA NONE DETECTED 08/20/2018 1836   COCAINSCRNUR NONE DETECTED 08/19/2018 1836   LABBENZ NONE DETECTED 08/25/2018 1836   AMPHETMU NONE DETECTED 08/22/2018 1836   THCU NONE DETECTED 08/15/2018 1836   LABBARB NONE  DETECTED 08/15/2018 1836    Alcohol Level No results found for: ETH  IMAGING  Ct Angio Head W Or Wo Contrast Ct Angio Neck W Or Wo Contrast 08/15/2018 IMPRESSION:  1. Interval basilar to left proximal PCA stenting. The density of the stent walls precludes detection of in stent stenosis; there is a degree of wasting at the mid basilar segment. There is flow in the left PCA beyond the stent such that there is presumed stent patency.  2. Known lower pontine infarct. There are new small cerebellar infarcts since brain MRI yesterday.  3. Severe left P2 segment stenosis, also seen previously.  4. Moderate atheromatous narrowing in the proximal left MCA.    Ct Angio Head W Or Wo Contrast Ct Angio Neck W Or Wo Contrast 09/01/2018 IMPRESSION:  1. Extensive atherosclerotic  disease in the basilar with focal critical stenosis in the mid to distal basilar. No definite acute thrombus identified.  2. Severe stenosis left posterior cerebral artery and moderate stenosis right posterior cerebral artery.  3. Mild stenosis left MCA and moderate stenosis left MCA bifurcation.  4. No significant carotid or vertebral artery stenosis in the neck.    Ct Head Wo Contrast 08/20/2018 IMPRESSION:  No acute intracranial abnormalities. Mild white matter changes likely due to small vessel ischemia.    Mr Brain Wo Contrast 08/26/2018 IMPRESSION:  1. Acute nonhemorrhagic infarct involving the left paramedian brainstem. The infarct crosses midline.  2. Other periventricular and subcortical white matter disease is moderately advanced for age. This likely reflects the sequela of chronic microvascular ischemia.  3. Tapering of the dens with prominent soft tissue pannus. 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    Ct Head Code Stroke Wo Contrast 08/28/2018  IMPRESSION:  1. No acute abnormality and no change from earlier today  2. ASPECTS is 10 3.    Portable Chest X-ray 08/07/2018 IMPRESSION:   Interval placement of endotracheal tube. Question developing perihilar edema or infectious process. Gaseous distension of the stomach.   Cerebral Angiogram 08/06/2018 S/P 4 vessel cerebral arteriogram RT CFA approach. 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 S/P stent assisted angioplasty of distal basilar artery with patency of 80 to 90 %. 2.Approx 70 % stenosis of LT ICA supraclinoid seg   MRI 08/15/18 1. Progressive acute pontine infarct. New patchy bilateral cerebellar infarction. New tiny left thalamic infarcts. 2. Preserved flow void in the stented basilar. 3. left facial/submandibular edema, please correlate with neck exam. Edited result: IMPRESSION: 1. Progressive acute pontine infarct. New patchy bilateral cerebellar infarction. New tiny left thalamic infarcts. 2. Preserved flow void in the stented basilar.  TTE - Left ventricle: The cavity size was normal. There was severe   concentric hypertrophy. Systolic function was normal. The   estimated ejection fraction was in the range of 60% to 65%. Wall   motion was normal; there were no regional wall motion   abnormalities. Doppler parameters are consistent with abnormal   left ventricular relaxation (grade 1 diastolic dysfunction). The   E/e&' ratio is <8, suggesting normal LV filling pressure. - Aortic valve: Trileaflet; mildly calcified leaflets.   Transvalvular velocity was minimally increased. There was no   regurgitation. Mean gradient (S): 12 mm Hg. - Mitral valve: Mildly thickened leaflets . There was trivial   regurgitation. - Left atrium: The atrium was normal in size. - Inferior vena cava: The vessel was dilated. The respirophasic   diameter changes were blunted (< 50%), consistent with elevated   central venous pressure. Impressions: - LVEF 60-65%, severe LVH, normal wall motion, grade 1 DD, normal   LV  filling pressure, minimally increased aortic velocity without   signficant stenosis, trivial MR, normal LA size, dilated IVC.   PHYSICAL EXAM  Temp:  [98.6 F (37 C)-99.9 F (37.7 C)] 99.8 F (37.7 C) (10/15 1200) Pulse Rate:  [67-98] 76 (10/15 1218) Resp:  [16-33] 21 (10/15 1218) BP: (139-208)/(67-105) 155/77 (10/15 1218) SpO2:  [96 %-100 %] 99 % (10/15 1218) FiO2 (%):  [30 %] 30 % (10/15 1218) Weight:  [127.2 kg] 127.2 kg (10/15 0615)  General -  Obese middle-aged African-American lady, intubated    Ophthalmologic - fundi not visualized due to noncooperation.  Cardiovascular - Regular rate  and rhythm.  Neuro - awake, alert, eyes open, still intubated, not on  sedation. No ptosis, minimal disconjugate eyes with right eye upward deviation . Right gaze incomplete with rotational nystagmus. Can cross midline and nods yes that she can see examiner both on the right and left sides. PERRL. Corneal, gag and cough present. Facial symmetry and tongue not able to test due to ET tube.. Lft arm moves slightly to command as do the bilateral LE  able to wiggle toes and move back and forth, left positive babinski, right negative babinski. DTR 1+. Sensation not cooperative, coordination and gait not tested.     ASSESSMENT/PLAN [name] is a 57 y.o. female with history of difficult to control hypertension, insulin-dependent diabetes, obesity, hypothyroidism status post ablation presenting with chest discomfort, right-sided weakness, slurred speech and right facial droop. She did not receive IV t-PA due to late presentation. S/P stent assisted angioplasty of distal basilar artery.  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 Worsening symptoms with extension of pontine/medullary infarcts with new small bilateral cerebellar infarcts without evidence of stent re-stenosis or occlusion  Resultant b// UE and LE weakness, eyes minimally disconjugate  but improving, nystagmus  CT head - No acute intracranial abnormalities.  CTA H&N 08/18/2018 - Extensive atherosclerotic disease in the basilar with focal critical stenosis in the mid to distal basilar. No definite acute thrombus identified. Severe stenosis left posterior cerebral artery  MRI head - Acute nonhemorrhagic infarct involving the left paramedian brainstem.   CTA H&N 08/15/2018 - stent too dense to see BA lumen, but distal flow preserved. New small cerebellar infarcts since brain MRI yesterday.  Severe left P2 segment stenosis, also seen previously.   Repeat MRI - extension of b/l pontine and upper medullary infarcts with new b/l cerebellar infarcts.  2D Echo - EF 60-65%  LDL - 174  HgbA1c - 11.1  P2Y12 = 35  VTE prophylaxis - heparin subq  Diet - NPO  aspirin 81 mg daily prior to admission, now on Brilinta 90 mg daily and ASA 81 mg daily. -plan to hold 08/19/18 for elective PEG tube and tracheostomy later this week.switch to iv heparin till procedures  Patient counseled to be compliant with her antithrombotic medications  Ongoing aggressive stroke risk factor management  Therapy recommendations:  pending  Disposition:  Pending  Intracranial stenosis  BA mid to distal severe stenosis s/p BA stenting  Left PCA severe stenosis, R PCA moderate stenosis  Left MCA moderate stenosis  Uncontrolled stroke risk factors with HLD, HTN, DM  Non compliance with meds at home  Respiratory failure  Intubated  CCM on board  Continue vent support  Hypertension  Off of Cleviprex at this time, not on fentanyl  BP goal 130-180   On po norvasc, losartan and metoprolol  D/c A - line  BP go by cuff pressure  Avoid low BP  Hyperlipidemia  Lipid lowering medication PTA:  Lipitor 20 mg daily  LDL 174, goal < 70  Increase to 80 mg daily  Continue statin at discharge  Diabetes  HgbA1c 11.4, goal < 7.0  Uncontrolled  Hyperglycemia  improving  SSI  CBG monitoring  Other Stroke Risk Factors  Obesity, Body mass index is 48.13 kg/m., recommend weight loss, diet and exercise as appropriate   Screen for sleep apnea when appropriate and may benefit from referral to [facility] Sleep team  Other Active Problems  Anemia due to acute blood loss -  11.1->9.7->9.4->9.3  Elevated creatinine - 1.04->1.13->1.03  Hypokalemia - 3.1 -> supplement->3.7->3.8  Leukocytosis - 9.4->11.1->9.6->11.3  Hospital day # 5 Patient is unlikely to protect her airway and the benefit with consideration for early tracheostomy and PEG tube. Long discussion with the patient and family members at the bedside on 08/18/18 as well as with Dr. Malvin Johns critical care medicine who agreed with plan of care. Sister to discuss with rest of family members and make final decision about trach and PEG today This patient is critically ill and at significant risk of neurological worsening, death and care requires constant monitoring of vital signs, hemodynamics,respiratory and cardiac monitoring,review of multiple databases, neurological assessment, discussion with family, other specialists and medical decision making of high complexity.I  I spent 35 minutes of neurocritical care time in the care of this patient.  Antony Contras, MD [facility]    To contact Stroke Continuity provider, please refer to [email or web address] After hours, contact General Neurology

## 2018-08-19 NOTE — Progress Notes (Signed)
FPTS Social Progress Note  S: Patient appears to be doing well today, although no significant change overnight. She is sleeping peacefully at time of visit. Critical care team still managing. Appears extubation may be attempted today with possible tracheostomy if fails.  O: BP (!) 156/74   Pulse 81   Temp 99.5 F (37.5 C) (Axillary)   Resp (!) 28   Ht 5\' 4"  (1.626 m)   Wt 127.2 kg   LMP 07/26/2018   SpO2 100%   BMI 48.13 kg/m     A/P: Will continue to follow   Danna Hefty, DO 08/19/2018, 1:14 AM PGY-1, [place] [facility] pager [phone]

## 2018-08-20 LAB — CBC
HCT: 32.8 % — ABNORMAL LOW (ref 36.0–46.0)
HEMOGLOBIN: 9.4 g/dL — AB (ref 12.0–15.0)
MCH: 22.8 pg — ABNORMAL LOW (ref 26.0–34.0)
MCHC: 28.7 g/dL — AB (ref 30.0–36.0)
MCV: 79.6 fL — ABNORMAL LOW (ref 80.0–100.0)
PLATELETS: 489 10*3/uL — AB (ref 150–400)
RBC: 4.12 MIL/uL (ref 3.87–5.11)
RDW: 16.1 % — ABNORMAL HIGH (ref 11.5–15.5)
WBC: 12.7 10*3/uL — AB (ref 4.0–10.5)
nRBC: 0.2 % (ref 0.0–0.2)

## 2018-08-20 LAB — GLUCOSE, CAPILLARY
GLUCOSE-CAPILLARY: 233 mg/dL — AB (ref 70–99)
GLUCOSE-CAPILLARY: 244 mg/dL — AB (ref 70–99)
GLUCOSE-CAPILLARY: 215 mg/dL — AB (ref 70–99)
Glucose-Capillary: 188 mg/dL — ABNORMAL HIGH (ref 70–99)
Glucose-Capillary: 182 mg/dL — ABNORMAL HIGH (ref 70–99)

## 2018-08-20 LAB — HEPARIN LEVEL (UNFRACTIONATED): HEPARIN UNFRACTIONATED: 0.11 [IU]/mL — AB (ref 0.30–0.70)

## 2018-08-20 MED ORDER — PANTOPRAZOLE SODIUM 40 MG PO PACK
40.0000 mg | PACK | Freq: Every day | ORAL | Status: DC
  Administered 2018-08-20 – 2018-11-13 (×84): 40 mg
  Filled 2018-08-20 (×86): qty 20

## 2018-08-20 MED ORDER — FUROSEMIDE 10 MG/ML IJ SOLN
40.0000 mg | Freq: Once | INTRAMUSCULAR | Status: AC
  Administered 2018-08-20: 40 mg via INTRAVENOUS
  Filled 2018-08-20: qty 4

## 2018-08-20 MED ORDER — INSULIN GLARGINE 100 UNIT/ML ~~LOC~~ SOLN
25.0000 [IU] | Freq: Two times a day (BID) | SUBCUTANEOUS | Status: DC
  Administered 2018-08-20 – 2018-08-21 (×3): 25 [IU] via SUBCUTANEOUS
  Filled 2018-08-20 (×3): qty 0.25

## 2018-08-20 MED ORDER — POTASSIUM CHLORIDE 20 MEQ PO PACK
40.0000 meq | PACK | Freq: Two times a day (BID) | ORAL | Status: AC
  Administered 2018-08-20 (×2): 40 meq via ORAL
  Filled 2018-08-20 (×2): qty 2

## 2018-08-20 MED ORDER — CARVEDILOL 12.5 MG PO TABS
25.0000 mg | ORAL_TABLET | Freq: Two times a day (BID) | ORAL | Status: DC
  Administered 2018-08-20 – 2018-11-13 (×168): 25 mg
  Filled 2018-08-20: qty 1
  Filled 2018-08-20 (×3): qty 2
  Filled 2018-08-20: qty 1
  Filled 2018-08-20: qty 2
  Filled 2018-08-20 (×4): qty 1
  Filled 2018-08-20 (×4): qty 2
  Filled 2018-08-20 (×2): qty 1
  Filled 2018-08-20: qty 2
  Filled 2018-08-20 (×2): qty 1
  Filled 2018-08-20: qty 2
  Filled 2018-08-20 (×2): qty 1
  Filled 2018-08-20: qty 2
  Filled 2018-08-20: qty 1
  Filled 2018-08-20 (×6): qty 2
  Filled 2018-08-20: qty 1
  Filled 2018-08-20 (×5): qty 2
  Filled 2018-08-20 (×3): qty 1
  Filled 2018-08-20 (×7): qty 2
  Filled 2018-08-20 (×3): qty 1
  Filled 2018-08-20 (×3): qty 2
  Filled 2018-08-20: qty 1
  Filled 2018-08-20 (×4): qty 2
  Filled 2018-08-20 (×3): qty 1
  Filled 2018-08-20 (×2): qty 2
  Filled 2018-08-20 (×2): qty 1
  Filled 2018-08-20 (×4): qty 2
  Filled 2018-08-20: qty 1
  Filled 2018-08-20: qty 2
  Filled 2018-08-20: qty 1
  Filled 2018-08-20 (×4): qty 2
  Filled 2018-08-20: qty 1
  Filled 2018-08-20 (×4): qty 2
  Filled 2018-08-20: qty 1
  Filled 2018-08-20 (×5): qty 2
  Filled 2018-08-20: qty 1
  Filled 2018-08-20: qty 2
  Filled 2018-08-20: qty 1
  Filled 2018-08-20: qty 2
  Filled 2018-08-20: qty 1
  Filled 2018-08-20: qty 2
  Filled 2018-08-20: qty 1
  Filled 2018-08-20 (×2): qty 2
  Filled 2018-08-20 (×3): qty 1
  Filled 2018-08-20: qty 2
  Filled 2018-08-20: qty 1
  Filled 2018-08-20 (×3): qty 2
  Filled 2018-08-20 (×2): qty 1
  Filled 2018-08-20: qty 2
  Filled 2018-08-20: qty 1
  Filled 2018-08-20 (×7): qty 2
  Filled 2018-08-20: qty 1
  Filled 2018-08-20: qty 2
  Filled 2018-08-20: qty 1
  Filled 2018-08-20 (×2): qty 2
  Filled 2018-08-20: qty 1
  Filled 2018-08-20 (×6): qty 2
  Filled 2018-08-20: qty 1
  Filled 2018-08-20: qty 2
  Filled 2018-08-20 (×2): qty 1
  Filled 2018-08-20 (×2): qty 2
  Filled 2018-08-20: qty 1
  Filled 2018-08-20 (×3): qty 2
  Filled 2018-08-20 (×2): qty 1
  Filled 2018-08-20 (×3): qty 2
  Filled 2018-08-20: qty 1
  Filled 2018-08-20: qty 2
  Filled 2018-08-20 (×2): qty 1
  Filled 2018-08-20 (×2): qty 2
  Filled 2018-08-20: qty 1
  Filled 2018-08-20 (×4): qty 2
  Filled 2018-08-20 (×2): qty 1
  Filled 2018-08-20 (×4): qty 2
  Filled 2018-08-20: qty 1
  Filled 2018-08-20 (×2): qty 2
  Filled 2018-08-20 (×2): qty 1
  Filled 2018-08-20 (×2): qty 2
  Filled 2018-08-20 (×2): qty 1
  Filled 2018-08-20 (×2): qty 2
  Filled 2018-08-20: qty 1

## 2018-08-20 NOTE — Progress Notes (Signed)
FPTS Social Progress Note  S: Patient sleeping during exam. Not significant change ON. Family sleeping at bedside. Per chart review, prepping for trach and Peg tube placement.   O: BP (!) 152/76   Pulse 72   Temp 98.4 F (36.9 C) (Axillary)   Resp (!) 24   Ht 5\' 4"  (1.626 m)   Wt 127.2 kg   LMP 07/26/2018   SpO2 99%   BMI 48.13 kg/m     A/P: Will continue to follow   Danna Hefty, DO 08/20/2018, 6:00 AM PGY-1, [place] [facility] pager [phone]

## 2018-08-20 NOTE — Progress Notes (Signed)
Physical Therapy Treatment Patient Details Name: Andrea [name] MRN: [id number] DOB: 08-Dec-1960 Today's Date: 08/20/2018    History of Present Illness 57 yo presented to ED with chest pain noted Rt hemiparesis in ED with acute Left paramedian brainstem infarct. Intubated 10/10 for angioplasty, extubated post procedure and reintubated. Repeat MRI with brainstem infarct extension and bil cerebellar infarcts. PMHx: HTN, DM, CKD    PT Comments    Pt is progressing with mobility to EOB today.  She tolerated the vent well with mobility on CPAP.  RN did deep suction prior to mobility.  She tolerated EOB for greater than 10 mins and was able to consistently follow one step commands.     Follow Up Recommendations  Supervision/Assistance - 24 hour;LTACH     Equipment Recommendations  Hospital bed;Wheelchair (measurements PT);Other (comment)(hoyer lift)    Recommendations for Other Services   NA     Precautions / Restrictions Precautions Precautions: Fall Precaution Comments: vent, panda    Mobility  Bed Mobility Overal bed mobility: Needs Assistance Bed Mobility: Supine to Sit;Sit to Supine     Supine to sit: +2 for physical assistance;Total assist;HOB elevated Sit to supine: +2 for physical assistance;Total assist;HOB elevated   General bed mobility comments: Two person total assist in helping pt transition from supine to right side of bed (vent side).  Assist to move LEs and support trunk using bed pad to control hips.    Transfers                 General transfer comment: Unable at this time.  Maxi move most appropriate.          Balance Overall balance assessment: Needs assistance Sitting-balance support: Bilateral upper extremity supported Sitting balance-Leahy Scale: Zero Sitting balance - Comments: Max assist EOB due to posterior/R lateral lean, worked on cervical extension, command following, and sitting tolerance.  Pt sat EOB >10 mins VSS on ETT CPAP FiO2  30% PEEP5.                                    Cognition Arousal/Alertness: Awake/alert Behavior During Therapy: Flat affect Overall Cognitive Status: Difficult to assess                                 General Comments: following 1 step commands; nodding head appropriately; blinking eyes for yes/no      Exercises General Exercises - Upper Extremity Shoulder Flexion: PROM;Both;10 reps;Supine(scapular ROM) Elbow Flexion: PROM;Both;10 reps;Supine Other Exercises Other Exercises: BUE general PROM Other Exercises: general neck ROM        Pertinent Vitals/Pain Pain Assessment: Faces Pain Score: 4  Faces Pain Scale: Hurts little more Pain Location: with neck movement and B shoulder flexion above 80 degrees Pain Descriptors / Indicators: Grimacing Pain Intervention(s): Limited activity within patient's tolerance;Monitored during session;Repositioned           PT Goals (current goals can now be found in the care plan section) Acute Rehab PT Goals Patient Stated Goal: be able to move Progress towards PT goals: Progressing toward goals    Frequency    Min 2X/week      PT Plan Current plan remains appropriate    Co-evaluation PT/OT/SLP Co-Evaluation/Treatment: Yes Reason for Co-Treatment: Complexity of the patient's impairments (multi-system involvement);Necessary to address cognition/behavior during functional activity;For patient/therapist safety;To address functional/ADL transfers PT  goals addressed during session: Mobility/safety with mobility;Balance;Strengthening/ROM OT goals addressed during session: ADL's and self-care;Strengthening/ROM      AM-PAC PT "6 Clicks" Daily Activity  Outcome Measure  Difficulty turning over in bed (including adjusting bedclothes, sheets and blankets)?: Unable Difficulty moving from lying on back to sitting on the side of the bed? : Unable Difficulty sitting down on and standing up from a chair with arms  (e.g., wheelchair, bedside commode, etc,.)?: Unable Help needed moving to and from a bed to chair (including a wheelchair)?: Total Help needed walking in hospital room?: Total Help needed climbing 3-5 steps with a railing? : Total 6 Click Score: 6    End of Session Equipment Utilized During Treatment: Oxygen;Other (comment)(vent) Activity Tolerance: Patient tolerated treatment well Patient left: in bed;with call bell/phone within reach;Other (comment)(with OT in room continuing to work with pt) Nurse Communication: Mobility status;Need for lift equipment PT Visit Diagnosis: Other abnormalities of gait and mobility (R26.89);Muscle weakness (generalized) (M62.81);Other symptoms and signs involving the nervous system (R29.898);Hemiplegia and hemiparesis Hemiplegia - Right/Left: Right Hemiplegia - dominant/non-dominant: Dominant Hemiplegia - caused by: Cerebral infarction     Time: 3545-6256 PT Time Calculation (min) (ACUTE ONLY): 56 min  Charges:  $Therapeutic Activity: 23-37 mins          Shaqueta Casady B. Tyreece Gelles, PT, DPT  Acute Rehabilitation [phone] pager #[phone] office           08/20/2018, 2:12 PM

## 2018-08-20 NOTE — Progress Notes (Signed)
STROKE TEAM PROGRESS NOTE   SUBJECTIVE (INTERVAL HISTORY) PT/OT are working with patient at bedside.  Pt neuro stable overnight.   She is awake, alert, following commands.  Plan for early trach and PEG this Friday.  Patient still has right gaze nystagmus, quadriplegia with left lower extremity improvement.  Glucose still high, increase Lantus dose.  BP still high, metoprolol changed to Coreg.  OBJECTIVE Vitals:   08/20/18 1100 08/20/18 1130 08/20/18 1200 08/20/18 1236  BP: (!) 145/84 (!) 151/86  133/71  Pulse: 93   77  Resp: (!) 21   (!) 22  Temp:   99.5 F (37.5 C)   TempSrc:   Axillary   SpO2: 99%   100%  Weight:      Height:        CBC:  Recent Labs  Lab 08/12/2018 1028 08/15/18 0512  08/19/18 0718 08/20/18 0358  WBC 9.4 11.1*   < > 11.0* 12.7*  NEUTROABS 5.2 9.1*  --   --   --   HGB 11.1* 9.7*   < > 9.6* 9.4*  HCT 36.0 32.4*   < > 33.8* 32.8*  MCV 76.9* 77.1*   < > 80.3 79.6*  PLT 418* 409*   < > 419* 489*   < > = values in this interval not displayed.    Basic Metabolic Panel:  Recent Labs  Lab 08/16/18 1423 08/17/18 0502  08/18/18 0626 08/19/18 0718  NA 136 140   < > 143 146*  K 3.9 3.8   < > 3.3* 3.4*  CL 113* 113*   < > 116* 116*  CO2 18* 18*   < > 21* 20*  GLUCOSE 209* 196*   < > 206* 276*  BUN 20 26*   < > 28* 36*  CREATININE 1.16* 1.03*   < > 0.95 1.07*  CALCIUM 9.5 10.1   < > 10.4* 11.5*  MG 1.6* 1.7  --   --  2.1  PHOS 3.3 3.3  --   --   --    < > = values in this interval not displayed.    Lipid Panel:     Component Value Date/Time   CHOL 256 (H) 08/15/2018 0512   TRIG 248 (H) 08/15/2018 0512   HDL 35 (L) 08/15/2018 0512   CHOLHDL 7.3 08/15/2018 0512   VLDL 50 (H) 08/15/2018 0512   LDLCALC 171 (H) 08/15/2018 0512   HgbA1c:  Lab Results  Component Value Date   HGBA1C 11.4 (H) 08/15/2018   Urine Drug Screen:     Component Value Date/Time   LABOPIA NONE DETECTED 08/25/2018 1836   COCAINSCRNUR NONE DETECTED 08/23/2018 1836   LABBENZ  NONE DETECTED 08/15/2018 1836   AMPHETMU NONE DETECTED 08/30/2018 1836   THCU NONE DETECTED 08/09/2018 1836   LABBARB NONE DETECTED 08/23/2018 1836    Alcohol Level No results found for: ETH  IMAGING  Ct Angio Head W Or Wo Contrast Ct Angio Neck W Or Wo Contrast 08/15/2018 IMPRESSION:  1. Interval basilar to left proximal PCA stenting. The density of the stent walls precludes detection of in stent stenosis; there is a degree of wasting at the mid basilar segment. There is flow in the left PCA beyond the stent such that there is presumed stent patency.  2. Known lower pontine infarct. There are new small cerebellar infarcts since brain MRI yesterday.  3. Severe left P2 segment stenosis, also seen previously.  4. Moderate atheromatous narrowing in the proximal left MCA.  Ct Angio Head W Or Wo Contrast Ct Angio Neck W Or Wo Contrast 08/09/2018 IMPRESSION:  1. 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 No definite acute thrombus identified.  2. Severe stenosis left posterior cerebral artery and moderate stenosis right posterior cerebral artery.  3. Mild stenosis left MCA and moderate stenosis left MCA bifurcation.  4. No significant carotid or vertebral artery stenosis in the neck.    Ct Head Wo Contrast 08/20/2018 IMPRESSION:  No acute intracranial abnormalities. Mild white matter changes likely due to small vessel ischemia.    Mr Brain Wo Contrast 08/13/2018 IMPRESSION:  1. Acute nonhemorrhagic infarct involving the left paramedian brainstem. The infarct crosses midline.  2. Other periventricular and subcortical white matter disease is moderately advanced for age. This likely reflects the sequela of chronic microvascular ischemia.  3. Tapering of the dens with prominent soft tissue pannus. 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    Ct Head Code Stroke Wo Contrast 08/09/2018  IMPRESSION:  1. No acute  abnormality and no change from earlier today  2. ASPECTS is 10 3.    Portable Chest X-ray 09/03/2018 IMPRESSION:  Interval placement of endotracheal tube. Question developing perihilar edema or infectious process. Gaseous distension of the stomach.   Cerebral Angiogram 08/18/2018 S/P 4 vessel cerebral arteriogram RT CFA approach. 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 S/P stent assisted angioplasty of distal basilar artery with patency of 80 to 90 %. 2.Approx 70 % stenosis of LT ICA supraclinoid seg   MRI 08/15/18 1. Progressive acute pontine infarct. New patchy bilateral cerebellar infarction. New tiny left thalamic infarcts. 2. Preserved flow void in the stented basilar. 3. left facial/submandibular edema, please correlate with neck exam. Edited result: IMPRESSION: 1. Progressive acute pontine infarct. New patchy bilateral cerebellar infarction. New tiny left thalamic infarcts. 2. Preserved flow void in the stented basilar.  TTE - Left ventricle: The cavity size was normal. There was severe   concentric hypertrophy. Systolic function was normal. The   estimated ejection fraction was in the range of 60% to 65%. Wall   motion was normal; there were no regional wall motion   abnormalities. Doppler parameters are consistent with abnormal   left ventricular relaxation (grade 1 diastolic dysfunction). The   E/e&' ratio is <8, suggesting normal LV filling pressure. - Aortic valve: Trileaflet; mildly calcified leaflets.   Transvalvular velocity was minimally increased. There was no   regurgitation. Mean gradient (S): 12 mm Hg. - Mitral valve: Mildly thickened leaflets . There was trivial   regurgitation. - Left atrium: The atrium was normal in size. - Inferior vena cava: The vessel was dilated. The respirophasic   diameter changes were blunted (< 50%), consistent with  elevated   central venous pressure. Impressions: - LVEF 60-65%, severe LVH, normal wall motion, grade 1 DD, normal   LV filling pressure, minimally increased aortic velocity without   signficant stenosis, trivial MR, normal LA size, dilated IVC.   PHYSICAL EXAM  Temp:  [98.4 F (36.9 C)-99.9 F (37.7 C)] 99.5 F (37.5 C) (10/16 1200) Pulse Rate:  [70-107] 77 (10/16 1236) Resp:  [20-32] 22 (10/16 1236) BP: (133-199)/(70-107) 133/71 (10/16 1236) SpO2:  [97 %-100 %] 100 % (10/16 1236) FiO2 (%):  [30 %] 30 % (10/16 1236) Weight:  [125.3 kg] 125.3 kg (10/16 0500)  General -  Obese  middle-aged African-American lady, still intubated    Ophthalmologic - fundi not visualized due to noncooperation.  Cardiovascular - Regular rate and rhythm.  Neuro - awake, alert, eyes open, still intubated, not on sedation. No ptosis. Right gaze incomplete with rotational nystagmus. PERRL, EOMI. Corneal, gag and cough present. Facial symmetry and tongue not able to test due to ET tube.. Left arm moves slightly to command as do the bilateral LE  able to wiggle toes and move back and forth, left positive babinski, right negative babinski. DTR 1+. Sensation not cooperative, coordination and gait not tested.   ASSESSMENT/PLAN Andrea [name] is a 57 y.o. female with history of difficult to control hypertension, insulin-dependent diabetes, obesity, hypothyroidism status post ablation presenting with chest discomfort, right-sided weakness, slurred speech and right facial droop. She did not receive IV t-PA due to late presentation. S/P stent assisted angioplasty of distal basilar artery.  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 Worsening symptoms with extension of pontine/medullary infarcts with new small bilateral cerebellar infarcts without evidence of stent re-stenosis or occlusion  Resultant b// UE and LE weakness, eyes minimally disconjugate but improving,  nystagmus  CT head - No acute intracranial abnormalities.  CTA H&N 09/04/2018 - Extensive atherosclerotic disease in the basilar with focal critical stenosis in the mid to distal basilar. No definite acute thrombus identified. Severe stenosis left posterior cerebral artery  MRI head - Acute nonhemorrhagic infarct involving the left paramedian brainstem.   CTA H&N 08/15/2018 - stent too dense to see BA lumen, but distal flow preserved. New small cerebellar infarcts since brain MRI yesterday.  Severe left P2 segment stenosis, also seen previously.   Repeat MRI - extension of b/l pontine and upper medullary infarcts with new b/l cerebellar infarcts.  2D Echo - EF 60-65%  LDL - 174  HgbA1c - 11.1  P2Y12 = 35  VTE prophylaxis - heparin subq  Diet - NPO  aspirin 81 mg daily prior to admission, was on Brilinta 90 mg daily and ASA 81 mg daily.  Brilinta currently on hold since 08/19/18 for elective PEG tube and tracheostomy this Friday. Currently on iv heparin till procedures  Patient counseled to be compliant with her antithrombotic medications  Ongoing aggressive stroke risk factor management  Therapy recommendations:  pending  Disposition:  Pending  Intracranial stenosis  BA mid to distal severe stenosis s/p BA stenting  Left PCA severe stenosis, R PCA moderate stenosis  Left MCA moderate stenosis  Uncontrolled stroke risk factors with HLD, HTN, DM  Non compliance with meds at home  Respiratory failure  Intubated  CCM on board  Tolerated weaning female  Plan for trach Friday  Hypertension  Stable, on the high end  BP goal 130-160   On po norvasc, losartan  Metoprolol changed to Coreg 25 bid  Avoid low BP  Hyperlipidemia  Lipid lowering medication PTA:  Lipitor 20 mg daily  LDL 174, goal < 70  Increase to 80 mg daily  Continue statin at discharge  Diabetes  HgbA1c 11.4, goal < 7.0  Uncontrolled  Hyperglycemia   Lantus dose  increased  Continue basal NovoLog  SSI  CBG monitoring  DM coordinator on board  Dysphagia  On tube feeding 55 cc/h  IV fluids 30 cc/h  Plan for PEG tube Friday  Other Stroke Risk Factors  Obesity, Body mass index is 47.42 kg/m., recommend weight loss, diet and exercise as appropriate   Screen for sleep apnea when  appropriate and may benefit from referral to [facility] Sleep team  Other Active Problems  Anemia due to acute blood loss - 11.1->9.7->9.4->9.3->9.4  Elevated creatinine - 1.04->1.13->1.03->1.07  Hypokalemia - 3.1 -> supplement->3.7->3.8->3.4  Leukocytosis - 9.4->11.1->9.6->11.3->12.7  Hospital day # 6  This patient is critically ill due to worsening brainstem infarct, BA stenosis s/p stenting, hyperglycemia, hypertensive and at significant risk of neurological worsening, death form recurrent stroke, hemorrhagic conversion, CHF, seizure, aspiration. This patient's care requires constant monitoring of vital signs, hemodynamics, respiratory and cardiac monitoring, review of multiple databases, neurological assessment, discussion with family, other specialists and medical decision making of high complexity. I spent 35 minutes of neurocritical care time in the care of this patient.  Rosalin Hawking, MD PhD Stroke Neurology 08/20/2018 1:29 PM    To contact Stroke Continuity provider, please refer to [email or web address] After hours, contact General Neurology

## 2018-08-20 NOTE — Progress Notes (Signed)
NAME:  Andrea [name], MRN:  [id number], DOB:  04-Nov-1961, LOS: 6 ADMISSION DATE:  08/31/2018, CONSULTATION DATE:  08/07/2018 REFERRING MD:  Dr. Rory Percy, CHIEF COMPLAINT:  CP/ Acute CVA  Brief History   49 yoF w/poorly controlled HTN and IDDM presenting with CP and left arm pain w/BP 220/117. Initial CTH neg.  Cardiac workup negative for acute event thus far, cardiology following.  Code stroke initiated for R hemiplegia, dysarthria and right facial droop.  Out of window for TPA.  Requiring cleviprex for BP control.  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  Evolving pontine stroke on MRI. Intubated for neuro IR s/p stenting and angioplasty with 80-90% patency.  Extubated post procedure but due to insufficency placed on BiPAP and brought to ICU.    Past Medical History  HTN, IDDM, hypothyroidism s/p ablation  Significant Hospital Events   10/9 present to [facility] ED -transferred to [facility] 10/9 basilar artery stenting 10/9 intubated urgently 6h post procedure for inability to protect airway. 10/10 CTA for declining exam - evolving pontine infarct, stent deemed to be patent  Consults: date of consult/date signed off & final recs:  Cards 10/10 Neurology 10/10 PCCM 10/10  Procedures (surgical and bedside):  10/10 Neuro IR >> s/p stent and angioplasty of distal basilar artery  10/10 ETT for procedure 10/10 ETT (reintubated) >>  10/10 L R art line >>  Significant Diagnostic Tests:  08/21/2018  CTH >>  No acute intracranial abnormalities. Mild white matter changes likely due to small vessel ischemia  08/1018 CTH >> 1. No acute abnormality and no change from earlier today 2. ASPECTS is 10  09/02/2018  CTA head and neck >>  1. 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 No definite acute thrombus identified. 2. Severe stenosis left posterior cerebral artery and moderate stenosis right posterior cerebral  artery. 3. Mild stenosis left MCA and moderate stenosis left MCA bifurcation. 4. No significant carotid or vertebral artery stenosis in the neck.  08/26/2018 MRI - evolving pontine infarct.  08/15/18  Echocardiogram - normal LVSF with severe LVH.   Micro Data:  08/31/2018 MRSA PCR >>negative  Antimicrobials:  10/10 cefazolin preop  Subjective:  No further hemoptysis reported Currently on PS 5  Objective   Blood pressure (!) 180/83, pulse 82, temperature 98.4 F (36.9 C), temperature source Axillary, resp. rate (!) 27, height 5\' 4"  (1.626 m), weight 125.3 kg, last menstrual period 07/26/2018, SpO2 99 %.    Vent Mode: PRVC FiO2 (%):  [30 %] 30 % Set Rate:  [15 bmp] 15 bmp Vt Set:  [430 mL] 430 mL PEEP:  [5 cmH20] 5 cmH20 Pressure Support:  [5 cmH20] 5 cmH20 Plateau Pressure:  [15 cmH20-17 cmH20] 15 cmH20   Intake/Output Summary (Last 24 hours) at 08/20/2018 0756 Last data filed at 08/20/2018 0600 Gross per 24 hour  Intake 1222.67 ml  Output 2550 ml  Net -1327.33 ml   Filed Weights   08/18/18 0500 08/19/18 0615 08/20/18 0500  Weight: 127.2 kg 127.2 kg 125.3 kg    Examination: General: Obese woman, ventilated HEENT: Endotracheal tube in place, OG tube in place, no oral lesions Neuro: Wakes easily to voice, able to nod to questions. Moves left side 2/5 to commands. Remains plegic on the right side. PULM: Mild tachypnea with no dyssynchrony, symmetric chest rise, diffuse rhonchi. CV: Distant, regular, no murmur. Hypertensive today.  GI: Obese, soft, nondistended, positive bowel sounds  Extremities: 2+ generalized edema. Skin: No rash  Resolved Hospital Problem list    Assessment & Plan:   Acute ischemic pontine stroke.  Resulting in inadequate airway protection, now inability to follow commands in the extremities.   Stent placement interval basilar to left proximal PCA -Strict blood pressure control, goal SBP < 180 -Change anticoagulation from Brilinta to heparin plus  aspirin to facilitate trach placement.  Should be safe to proceed on 10/18. -Managing hyperglycemia as below -On no sedation.  Respiratory Insufficiency due to inability to protect airway from dysarthria.  Expected with location of stroke. -Continue pressure support ventilation - Chest physiotherapy and diuresis for rhonchi. -Discussed her risks for upper airway protection, obstruction, with neurology.  Given her no neurological deficits chances for adequate airway protection are low.  In this setting should probably move straight to tracheostomy.  Now on ASA and heparin.  Likely synchronized with PEG placement since we have to work around her anticoagulation.  Hypertensive Crisis -Will increase oral blood pressure control. Maintain acute blood pressure < 528 systolic, but long term systolic goal <413.  Control has been suboptimal throughout. - Currently appears volume overloaded, diuresis may help with blood pressure. - Change metoprolol to carvedilol for better antihypertensive effect.  Microcytic Anemia -Follow CBC - transfusion threshold Hgb < 7.0  Hemoptysis.  Self-limited, suspect this is due to suction trauma while on antiplatelet therapy. -Intermittent chest x-ray -Try to minimize suctioning as much as possible -If recurs then we will arrange for bronchoscopy and airway inspection  Type 2 diabetes -Control remains suboptimal with CBG in 200's throughout the day on continuous tube feeds.  - She is normally on 27 units at home and given stress, inactivity and high glycemic index of tube feeds will need at least that amount.  - Increase Glargine to 25 units.  Hypothyroidism, TSH 1.34 -Carries this diagnosis but TSH is reassuring, not currently on medications  Nutrition -Tube feeding as ordered -Will likely require PEG tube placement, synchronized with trach placement since we have to coordinate anticoagulation  Disposition / Summary of Today's Plan 08/20/18   Continue current  supportive care Plan for tracheostomy and PEG tube placement on 08/22/18    Diet: TF Pain/Anxiety/Delirium protocol (if indicated): N/a VAP protocol (if indicated): no DVT prophylaxis: SCDs  GI prophylaxis: Famotidine Hyperglycemia protocol: SSI, lantus Mobility: bedrest Code Status: Full  Family Communication: Updated the patient's sister and niece at bedside on 10/14  Labs ( personally  Reviewed)  CBC: Recent Labs  Lab 08/21/2018 1028 08/15/18 0512 08/16/18 0500 08/17/18 0502 08/18/18 0626 08/19/18 0718 08/20/18 0358  WBC 9.4 11.1* 9.6 11.3* 10.8* 11.0* 12.7*  NEUTROABS 5.2 9.1*  --   --   --   --   --   HGB 11.1* 9.7* 9.4* 9.3* 9.3* 9.6* 9.4*  HCT 36.0 32.4* 31.0* 32.0* 31.8* 33.8* 32.8*  MCV 76.9* 77.1* 78.1* 78.8* 78.7* 80.3 79.6*  PLT 418* 409* 381 357 393 419* 489*    Basic Metabolic Panel: Recent Labs  Lab 08/19/2018 2015  08/15/18 1633 08/16/18 0500 08/16/18 1423 08/17/18 0502 08/17/18 1243 08/18/18 0626 08/19/18 0718  NA  --    < >  --  137 136 140 139 143 146*  K  --    < >  --  3.7 3.9 3.8 4.0 3.3* 3.4*  CL  --    < >  --  111 113* 113* 116* 116* 116*  CO2  --    < >  --  18* 18* 18* 15* 21* 20*  GLUCOSE  --    < >  --  205* 209* 196* 215* 206* 276*  BUN  --    < >  --  17 20 26* 27* 28* 36*  CREATININE  --    < >  --  1.13* 1.16* 1.03* 1.01* 0.95 1.07*  CALCIUM  --    < >  --  9.7 9.5 10.1 10.1 10.4* 11.5*  MG 1.4*  --  1.7 1.8 1.6* 1.7  --   --  2.1  PHOS 3.3  --  3.6 3.2 3.3 3.3  --   --   --    < > = values in this interval not displayed.   GFR: Estimated Creatinine Clearance: 75.9 mL/min (A) (by C-G formula based on SCr of 1.07 mg/dL (H)). Recent Labs  Lab 08/17/18 0502 08/18/18 0626 08/19/18 0718 08/20/18 0358  WBC 11.3* 10.8* 11.0* 12.7*    Liver Function Tests: No results for input(s): AST, ALT, ALKPHOS, BILITOT, PROT, ALBUMIN in the last 168 hours. No results for input(s): LIPASE, AMYLASE in the last 168 hours. No results for  input(s): AMMONIA in the last 168 hours.  ABG    Component Value Date/Time   PHART 7.334 (L) 08/24/2018 2259   PCO2ART 41.9 09/04/2018 2259   PO2ART 275.0 (H) 08/16/2018 2259   HCO3 22.3 08/08/2018 2259   TCO2 24 08/19/2018 2259   ACIDBASEDEF 3.0 (H) 08/31/2018 2259   O2SAT 100.0 08/27/2018 2259     Coagulation Profile: Recent Labs  Lab 08/29/2018 1028 08/18/18 0626  INR 1.02 1.12    Cardiac Enzymes: Recent Labs  Lab 08/18/2018 0317 08/31/2018 2015  TROPONINI 0.03* <0.03    HbA1C: Hgb A1c MFr Bld  Date/Time Value Ref Range Status  08/15/2018 05:12 AM 11.4 (H) 4.8 - 5.6 % Final    Comment:    (NOTE) Pre diabetes:          5.7%-6.4% Diabetes:              >6.4% Glycemic control for   <7.0% adults with diabetes   01/12/2017 02:43 AM 12.8 (H) 4.8 - 5.6 % Final    Comment:    (NOTE)         Pre-diabetes: 5.7 - 6.4         Diabetes: >6.4         Glycemic control for adults with diabetes: <7.0     CBG: Recent Labs  Lab 08/19/18 1152 08/19/18 1559 08/19/18 1948 08/19/18 2348 08/20/18 0407  GLUCAP 271* 241* 266* 278* 233*    CRITICAL CARE Performed by: Kipp Brood   Total critical care time: 35 minutes  Critical care time was exclusive of separately billable procedures and treating other patients.  Critical care was necessary to treat or prevent imminent or life-threatening deterioration.  Critical care was time spent personally by me on the following activities: development of treatment plan with patient and/or surrogate as well as nursing, discussions with consultants, evaluation of patient's response to treatment, examination of patient, obtaining history from patient or surrogate, ordering and performing treatments and interventions, ordering and review of laboratory studies, ordering and review of radiographic studies, pulse oximetry and re-evaluation of patient's condition.  Kipp Brood, MD [facility] ICU Physician [place]  Pager:  [phone] Mobile: [phone] After hours: [phone].

## 2018-08-20 NOTE — Progress Notes (Signed)
[place] for Heparin Indication: Intracerebral stent  Allergies  Allergen Reactions  . Tramadol     "Makes me sees things and I just dont act right"    Patient Measurements: Height: 5\' 4"  (162.6 cm) Weight: 276 lb 3.8 oz (125.3 kg) IBW/kg (Calculated) : 54.7 Heparin Dosing Weight: 84.8 kg  Vital Signs: Temp: 99 F (37.2 C) (10/16 0700) Temp Source: Axillary (10/16 0700) BP: 180/88 (10/16 0800) Pulse Rate: 96 (10/16 0800)  Labs: Recent Labs    08/17/18 1243  08/18/18 0626 08/19/18 0718 08/19/18 1828 08/20/18 0358  HGB  --    < > 9.3* 9.6*  --  9.4*  HCT  --   --  31.8* 33.8*  --  32.8*  PLT  --   --  393 419*  --  489*  APTT  --   --  29  --   --   --   LABPROT  --   --  14.3  --   --   --   INR  --   --  1.12  --   --   --   HEPARINUNFRC  --   --   --   --  0.12* 0.11*  CREATININE 1.01*  --  0.95 1.07*  --   --    < > = values in this interval not displayed.    Estimated Creatinine Clearance: 75.9 mL/min (A) (by C-G formula based on SCr of 1.07 mg/dL (H)).    Assessment: 57 yo female with recent intracerebral stent currently on Brilinta and Aspirin therapy.  Patient needs to be bridged off of Brilinta for a trach and PEG.  In discussion with Dr. Estanislado Pandy and Dr. Lamonte Sakai, we will start low-dose Heparin therapy, but continue aspirin therapy.  Initial heparin level = 0.11  Goal of Therapy:  Heparin level 0.1 - 0.25 units/ml Monitor platelets by anticoagulation protocol: Yes   Plan:  Continue heparin at 650 units / hr Follow up AM labs  Alanda Slim, PharmD, [place] Pharmacist Please see AMION for all Pharmacists' Contact Phone Numbers 08/20/2018, 8:22 AM

## 2018-08-20 NOTE — Progress Notes (Signed)
Occupational Therapy Treatment Patient Details Name: Andrea [name] MRN: [id number] DOB: [birth year] Today's Date: 08/20/2018    History of present illness 57 yo presented to ED with chest pain noted Rt hemiparesis in ED with acute Left paramedian brainstem infarct. Intubated 10/10 for angioplasty, extubated post procedure and reintubated. Repeat MRI with brainstem infarct extension and bil cerebellar infarcts. PMHx: HTN, DM, CKD   OT comments  Pt mobilized to EOB on PRVC. VSS throughout session. Pt appears to be following commands appropriately and using 1 eye blink for "YES" and 2 eye blinks for "NO". Nodding head appropriately. Increased ability to actively extend head against gravity; able to actively move L leg in sitting and able to demonstrate LUE proximal movement in supine ( purposeful but not functional at this time).  No movement R side observed. Continue to recommend rehab at [facility]. Will continue to follow acutely.   Follow Up Recommendations  Other (comment)(LTACH)    Equipment Recommendations  Other (comment)(TBA)    Recommendations for Other Services      Precautions / Restrictions Precautions Precautions: Fall Precaution Comments: vent, panda       Mobility Bed Mobility Overal bed mobility: Needs Assistance Bed Mobility: Supine to Sit;Sit to Supine     Supine to sit: Total assist;+2 for physical assistance Sit to supine: Total assist;+2 for physical assistance      Transfers                 General transfer comment: unable    Balance Overall balance assessment: Needs assistance   Sitting balance-Leahy Scale: Zero Sitting balance - Comments: Able to actively extend head against gravity                                   ADL either performed or assessed with clinical judgement   ADL Overall ADL's : Needs assistance/impaired                                       General ADL Comments: total A at this time      Vision   Vision Assessment?: Vision impaired- to be further tested in functional context Additional Comments: nystagmus with lateral gaze; does not endorse diplopia with questioning; will further assess   Perception     Praxis      Cognition Arousal/Alertness: Awake/alert Behavior During Therapy: Flat affect(difficult to assess due to ET intubation) Overall Cognitive Status: Difficult to assess                                 General Comments: following 1 step commands; nodding head appropriately; blinking eyes for yes/no        Exercises Exercises: Other exercises General Exercises - Upper Extremity Shoulder Flexion: PROM;Both;10 reps;Supine(scapular ROM) Elbow Flexion: PROM;Both;10 reps;Supine Other Exercises Other Exercises: BUE general PROM Other Exercises: general neck ROM   Shoulder Instructions       General Comments      Pertinent Vitals/ Pain       Pain Assessment: Faces Faces Pain Scale: Hurts little more Pain Location: with neck movement and B shoulder flexion above 80 degrees Pain Descriptors / Indicators: Grimacing Pain Intervention(s): Limited activity within patient's tolerance;Repositioned  Home Living  Prior Functioning/Environment              Frequency  Min 2X/week        Progress Toward Goals  OT Goals(current goals can now be found in the care plan section)  Progress towards OT goals: Progressing toward goals  Acute Rehab OT Goals Patient Stated Goal: be able to move OT Goal Formulation: With patient/family Time For Goal Achievement: 08/05/2018 Potential to Achieve Goals: Fair ADL Goals Pt Will Perform Grooming: with max assist;bed level Pt Will Perform Upper Body Bathing: with max assist;bed level Pt/caregiver will Perform Home Exercise Program: Increased ROM;With written HEP provided;Both right and left upper extremity Additional ADL Goal #1: Pt will  sit EOB with +2 Max A x 10 min in preparation for functional activities.  Plan Discharge plan remains appropriate    Co-evaluation    PT/OT/SLP Co-Evaluation/Treatment: Yes Reason for Co-Treatment: Complexity of the patient's impairments (multi-system involvement);For patient/therapist safety   OT goals addressed during session: ADL's and self-care;Strengthening/ROM      AM-PAC PT "6 Clicks" Daily Activity     Outcome Measure   Help from another person eating meals?: Total Help from another person taking care of personal grooming?: Total Help from another person toileting, which includes using toliet, bedpan, or urinal?: Total Help from another person bathing (including washing, rinsing, drying)?: Total Help from another person to put on and taking off regular upper body clothing?: Total Help from another person to put on and taking off regular lower body clothing?: Total 6 Click Score: 6    End of Session Equipment Utilized During Treatment: Other (comment)(vent)  OT Visit Diagnosis: Other abnormalities of gait and mobility (R26.89);Muscle weakness (generalized) (M62.81);Low vision, both eyes (H54.2);Feeding difficulties (R63.3);Other symptoms and signs involving cognitive function;Hemiplegia and hemiparesis;Pain Hemiplegia - Right/Left: Right Hemiplegia - dominant/non-dominant: Non-Dominant Hemiplegia - caused by: Nontraumatic intracerebral hemorrhage;Cerebral infarction Pain - part of body: Shoulder(and neck with ROM)   Activity Tolerance Patient tolerated treatment well   Patient Left in bed;with call bell/phone within reach   Nurse Communication Mobility status;Other (comment)(need for B Prevalon boots)        Time: 1120-1200 OT Time Calculation (min): 40 min  Charges: OT General Charges $OT Visit: 1 Visit OT Treatments $Therapeutic Activity: 8-22 mins  Maurie Boettcher, OT/L   Acute OT Clinical Specialist [place] Pager [phone] Office  [phone]    [facility] 08/20/2018, 2:03 PM

## 2018-08-21 LAB — CBC
HEMATOCRIT: 30.7 % — AB (ref 36.0–46.0)
HEMOGLOBIN: 8.7 g/dL — AB (ref 12.0–15.0)
MCH: 22.9 pg — AB (ref 26.0–34.0)
MCHC: 28.3 g/dL — AB (ref 30.0–36.0)
MCV: 80.8 fL (ref 80.0–100.0)
Platelets: 491 10*3/uL — ABNORMAL HIGH (ref 150–400)
RBC: 3.8 MIL/uL — ABNORMAL LOW (ref 3.87–5.11)
RDW: 16.2 % — AB (ref 11.5–15.5)
WBC: 11.3 10*3/uL — ABNORMAL HIGH (ref 4.0–10.5)
nRBC: 0.3 % — ABNORMAL HIGH (ref 0.0–0.2)

## 2018-08-21 LAB — GLUCOSE, CAPILLARY
GLUCOSE-CAPILLARY: 149 mg/dL — AB (ref 70–99)
GLUCOSE-CAPILLARY: 190 mg/dL — AB (ref 70–99)
GLUCOSE-CAPILLARY: 190 mg/dL — AB (ref 70–99)
GLUCOSE-CAPILLARY: 207 mg/dL — AB (ref 70–99)
Glucose-Capillary: 156 mg/dL — ABNORMAL HIGH (ref 70–99)
Glucose-Capillary: 221 mg/dL — ABNORMAL HIGH (ref 70–99)
Glucose-Capillary: 165 mg/dL — ABNORMAL HIGH (ref 70–99)

## 2018-08-21 LAB — HEPARIN LEVEL (UNFRACTIONATED)
Heparin Unfractionated: <0.1 IU/mL — ABNORMAL LOW (ref 0.30–0.70)
Heparin Unfractionated: 0.15 IU/mL — ABNORMAL LOW (ref 0.30–0.70)

## 2018-08-21 MED ORDER — ETOMIDATE 2 MG/ML IV SOLN
40.0000 mg | Freq: Once | INTRAVENOUS | Status: AC
  Administered 2018-08-25: 20 mg via INTRAVENOUS
  Filled 2018-08-21: qty 20

## 2018-08-21 MED ORDER — MIDAZOLAM HCL 2 MG/2ML IJ SOLN: 4.0000 mg | Freq: Once | INTRAMUSCULAR | Status: DC

## 2018-08-21 MED ORDER — INSULIN GLARGINE 100 UNIT/ML ~~LOC~~ SOLN: 25.0000 [IU] | Freq: Two times a day (BID) | SUBCUTANEOUS | Status: DC

## 2018-08-21 MED ORDER — INSULIN GLARGINE 100 UNIT/ML ~~LOC~~ SOLN
25.0000 [IU] | Freq: Two times a day (BID) | SUBCUTANEOUS | Status: DC
  Administered 2018-08-21 – 2018-08-22 (×2): 25 [IU] via SUBCUTANEOUS
  Filled 2018-08-21 (×2): qty 0.25

## 2018-08-21 MED ORDER — FENTANYL CITRATE (PF) 100 MCG/2ML IJ SOLN
200.0000 ug | Freq: Once | INTRAMUSCULAR | Status: AC
  Administered 2018-08-25: 200 ug via INTRAVENOUS
  Filled 2018-08-21: qty 4

## 2018-08-21 MED ORDER — MIDAZOLAM HCL 2 MG/2ML IJ SOLN
4.0000 mg | Freq: Once | INTRAMUSCULAR | Status: AC
  Administered 2018-08-25: 4 mg via INTRAVENOUS
  Filled 2018-08-21: qty 4

## 2018-08-21 MED ORDER — INSULIN GLARGINE 100 UNIT/ML ~~LOC~~ SOLN: 15.0000 [IU] | Freq: Two times a day (BID) | SUBCUTANEOUS | Status: DC

## 2018-08-21 MED ORDER — VECURONIUM BROMIDE 10 MG IV SOLR
10.0000 mg | Freq: Once | INTRAVENOUS | Status: AC
  Administered 2018-08-25: 10 mg via INTRAVENOUS
  Filled 2018-08-21: qty 10

## 2018-08-21 MED ORDER — HYDROCODONE-ACETAMINOPHEN 5-325 MG PO TABS
1.0000 | ORAL_TABLET | ORAL | Status: DC | PRN
  Administered 2018-08-22: 1 via ORAL
  Filled 2018-08-21: qty 1

## 2018-08-21 MED ORDER — PROPOFOL 500 MG/50ML IV EMUL
5.0000 ug/kg/min | Freq: Once | INTRAVENOUS | Status: DC
  Filled 2018-08-21: qty 50

## 2018-08-21 NOTE — Progress Notes (Signed)
[facility] for Heparin Indication: Intracerebral stent  Allergies  Allergen Reactions  . Tramadol     "Makes me sees things and I just dont act right"    Patient Measurements: Height: 5\' 4"  (162.6 cm) Weight: 276 lb 3.8 oz (125.3 kg) IBW/kg (Calculated) : 54.7 Heparin Dosing Weight: 84.8 kg  Vital Signs: Temp: 98.9 F (37.2 C) (10/17 0400) Temp Source: Axillary (10/17 0400) BP: 142/79 (10/17 0200) Pulse Rate: 83 (10/17 0355)  Labs: Recent Labs    08/18/18 0626 08/19/18 0718 08/19/18 1828 08/20/18 0358 08/21/18 0306  HGB 9.3* 9.6*  --  9.4* 8.7*  HCT 31.8* 33.8*  --  32.8* 30.7*  PLT 393 419*  --  489* 491*  APTT 29  --   --   --   --   LABPROT 14.3  --   --   --   --   INR 1.12  --   --   --   --   HEPARINUNFRC  --   --  0.12* 0.11* <0.10*  CREATININE 0.95 1.07*  --   --   --     Estimated Creatinine Clearance: 75.9 mL/min (A) (by C-G formula based on SCr of 1.07 mg/dL (H)).  Assessment: 57 yo female with recent intracerebral stent, Brilinta on hold, for heparin bridge  Goal of Therapy:  Heparin level 0.1 - 0.25 units/ml Monitor platelets by anticoagulation protocol: Yes   Plan:  Increase Heparin 850 units/hr Check heparin level in 8 hours.   Phillis Knack, PharmD, BCPS  08/21/2018, 5:07 AM

## 2018-08-21 NOTE — Progress Notes (Addendum)
NAME:  Andrea [name], MRN:  [id number], DOB:  [birth year], LOS: 7 ADMISSION DATE:  08/24/2018, CONSULTATION DATE:  08/28/2018 REFERRING MD:  Dr. Rory Percy, CHIEF COMPLAINT:  CP/ Acute CVA  Brief History   56 yoF w/poorly controlled HTN and IDDM presenting with CP and left arm pain w/BP 220/117. Initial CTH neg.  Cardiac workup negative for acute event thus far, cardiology following.  Code stroke initiated for R hemiplegia, dysarthria and right facial droop.  Out of window for TPA.  Requiring cleviprex for BP control.  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  Evolving pontine stroke on MRI. Intubated for neuro IR s/p stenting and angioplasty with 80-90% patency.  Extubated post procedure but due to insufficency placed on BiPAP and brought to ICU.    Past Medical History  HTN, IDDM, hypothyroidism s/p ablation  Significant Hospital Events   10/9 present to [facility] ED -transferred to [facility] 10/9 basilar artery stenting 10/9 intubated urgently 6h post procedure for inability to protect airway. 10/10 CTA for declining exam - evolving pontine infarct, stent deemed to be patent  Consults: date of consult/date signed off & final recs:  Cards 10/10 Neurology 10/10 PCCM 10/10  Procedures (surgical and bedside):  10/10 Neuro IR >> s/p stent and angioplasty of distal basilar artery  10/10 ETT for procedure 10/10 ETT (reintubated) >>  10/10 L R art line >>  Significant Diagnostic Tests:  08/07/2018  CTH >>  No acute intracranial abnormalities. Mild white matter changes likely due to small vessel ischemia  08/1018 CTH >> 1. No acute abnormality and no change from earlier today 2. ASPECTS is 10  08/10/2018  CTA head and neck >>  1. 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 No definite acute thrombus identified. 2. Severe stenosis left posterior cerebral artery and moderate stenosis right posterior cerebral  artery. 3. Mild stenosis left MCA and moderate stenosis left MCA bifurcation. 4. No significant carotid or vertebral artery stenosis in the neck.  09/04/2018 MRI - evolving pontine infarct.  08/15/18  Echocardiogram - normal LVSF with severe LVH.   Micro Data:  08/13/2018 MRSA PCR >>negative  Antimicrobials:  10/10 cefazolin preop  Subjective:   Able to communicate by facial expressions but consistently weak in the extremities per RN.  Received fentanyl once for headache and more somnolent since.    Objective   Blood pressure 138/80, pulse 74, temperature 99.5 F (37.5 C), temperature source Axillary, resp. rate (!) 22, height 5\' 4"  (1.626 m), weight 125.3 kg, last menstrual period 07/26/2018, SpO2 96 %.    Vent Mode: PSV;CPAP FiO2 (%):  [30 %] 30 % Set Rate:  [15 bmp] 15 bmp Vt Set:  [430 mL] 430 mL PEEP:  [5 cmH20] 5 cmH20 Pressure Support:  [5 cmH20-10 cmH20] 10 cmH20 Plateau Pressure:  [12 cmH20-17 cmH20] 17 cmH20   Intake/Output Summary (Last 24 hours) at 08/21/2018 1059 Last data filed at 08/21/2018 1000 Gross per 24 hour  Intake 1347.65 ml  Output 2250 ml  Net -902.35 ml   Filed Weights   08/18/18 0500 08/19/18 0615 08/20/18 0500  Weight: 127.2 kg 127.2 kg 125.3 kg    Examination: General: Obese woman, ventilated HEENT: Endotracheal tube in place, OG tube in place, no oral lesions, but copious oral secretions. Neuro: Exam unchanged wakes easily to voice, able to nod to questions. Moves left side 2/5 to commands. Remains plegic on the right side. PULM:  Mild tachypnea with no dyssynchrony, symmetric chest rise, rhonchi have cleared and has tolerated wean to PSV CV: Distant, regular, no murmur. Hypertensive today.  GI: Obese, soft, nondistended, positive bowel sounds Extremities: 2+ generalized edema. Skin: IV sites intact. GU: improved urine output, clear urine.  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 Remains critically ill for this reason. Resulting in inadequate airway protection, now inability to follow commands in the extremities.   Swallowing dysfunction is not expected to improve sufficiently to permit extubation so tracheostomy and PEG tube is planned.  Have switched to oral pain medications to avoid oversedation. Anticoagulation changed from Brilinta to heparin plus aspirin to facilitate trach placement.  Not yet therapeutic on heparin. Continue close monitoring of neurological status as at high risk for stent occlusion while on bridging anticoagulation. Have discussed case with trauma surgery and have coordinated to perform tracheostomy and PEG sequentially on Monday. Have order pre-trach order set.  Respiratory Insufficiency due to inability to protect airway from dysarthria.  Expected with location of stroke. Remains critically ill for this reason. Continue full ventilatory support pending tracheostomy.  Hypertensive Crisis Improved blood pressure control. Have reintroduced oral anti-hypertensives. Continue to titrate to keep SBP < 180 acutely and achieve long term goal of <130.  Type 2 diabetes Improved control with current regimen. Will plan for reduced dose of insulin once NPO for tracheostomy.  Disposition / Summary of Today's Plan 08/21/18   Continue current supportive care Plan for tracheostomy and PEG tube placement on 08/24/2018    Diet: TF Pain/Anxiety/Delirium protocol (if indicated): N/a VAP protocol (if indicated): no DVT prophylaxis: SCDs  GI prophylaxis: Famotidine Hyperglycemia protocol: SSI, lantus Mobility: bedrest Code Status: Full  Family Communication: Updated the patient's sister and niece at bedside on 10/14  Labs ( personally  Reviewed)  CBC: Recent Labs  Lab 08/15/18 0512  08/17/18 0502 08/18/18 0626 08/19/18 0718 08/20/18 0358 08/21/18 0306  WBC 11.1*   < > 11.3* 10.8* 11.0* 12.7* 11.3*    NEUTROABS 9.1*  --   --   --   --   --   --   HGB 9.7*   < > 9.3* 9.3* 9.6* 9.4* 8.7*  HCT 32.4*   < > 32.0* 31.8* 33.8* 32.8* 30.7*  MCV 77.1*   < > 78.8* 78.7* 80.3 79.6* 80.8  PLT 409*   < > 357 393 419* 489* 491*   < > = values in this interval not displayed.    Basic Metabolic Panel: Recent Labs  Lab 08/07/2018 2015  08/15/18 1633 08/16/18 0500 08/16/18 1423 08/17/18 0502 08/17/18 1243 08/18/18 0626 08/19/18 0718  NA  --    < >  --  137 136 140 139 143 146*  K  --    < >  --  3.7 3.9 3.8 4.0 3.3* 3.4*  CL  --    < >  --  111 113* 113* 116* 116* 116*  CO2  --    < >  --  18* 18* 18* 15* 21* 20*  GLUCOSE  --    < >  --  205* 209* 196* 215* 206* 276*  BUN  --    < >  --  17 20 26* 27* 28* 36*  CREATININE  --    < >  --  1.13* 1.16* 1.03* 1.01* 0.95 1.07*  CALCIUM  --    < >  --  9.7 9.5 10.1 10.1 10.4* 11.5*  MG 1.4*  --  1.7 1.8 1.6* 1.7  --   --  2.1  PHOS 3.3  --  3.6 3.2 3.3 3.3  --   --   --    < > = values in this interval not displayed.   GFR: Estimated Creatinine Clearance: 75.9 mL/min (A) (by C-G formula based on SCr of 1.07 mg/dL (H)). Recent Labs  Lab 08/18/18 0626 08/19/18 0718 08/20/18 0358 08/21/18 0306  WBC 10.8* 11.0* 12.7* 11.3*    Liver Function Tests: No results for input(s): AST, ALT, ALKPHOS, BILITOT, PROT, ALBUMIN in the last 168 hours. No results for input(s): LIPASE, AMYLASE in the last 168 hours. No results for input(s): AMMONIA in the last 168 hours.  ABG    Component Value Date/Time   PHART 7.334 (L) 08/20/2018 2259   PCO2ART 41.9 08/17/2018 2259   PO2ART 275.0 (H) 08/13/2018 2259   HCO3 22.3 08/27/2018 2259   TCO2 24 09/01/2018 2259   ACIDBASEDEF 3.0 (H) 08/11/2018 2259   O2SAT 100.0 08/06/2018 2259     Coagulation Profile: Recent Labs  Lab 08/18/18 0626  INR 1.12    Cardiac Enzymes: Recent Labs  Lab 08/20/2018 2015  TROPONINI <0.03    HbA1C: Hgb A1c MFr Bld  Date/Time Value Ref Range Status  08/15/2018 05:12 AM  11.4 (H) 4.8 - 5.6 % Final    Comment:    (NOTE) Pre diabetes:          5.7%-6.4% Diabetes:              >6.4% Glycemic control for   <7.0% adults with diabetes   01/12/2017 02:43 AM 12.8 (H) 4.8 - 5.6 % Final    Comment:    (NOTE)         Pre-diabetes: 5.7 - 6.4         Diabetes: >6.4         Glycemic control for adults with diabetes: <7.0     CBG: Recent Labs  Lab 08/20/18 1548 08/20/18 2000 08/21/18 0009 08/21/18 0356 08/21/18 0841  GLUCAP 215* 182* 190* 190* 156*    CRITICAL CARE Performed by: Kipp Brood   Total critical care time: 40 minutes  Critical care time was exclusive of separately billable procedures and treating other patients.  Critical care was necessary to treat or prevent imminent or life-threatening deterioration.  Critical care was time spent personally by me on the following activities: development of treatment plan with patient and/or surrogate as well as nursing, discussions with consultants, evaluation of patient's response to treatment, examination of patient, obtaining history from patient or surrogate, ordering and performing treatments and interventions, ordering and review of laboratory studies, ordering and review of radiographic studies, pulse oximetry and re-evaluation of patient's condition.  Kipp Brood, MD [facility] ICU Physician [place]  Pager: [phone] Mobile: [phone] After hours: [phone].

## 2018-08-21 NOTE — Progress Notes (Signed)
FPTS Social Progress Note  S:Patient appears to be doing well this morning. She is awake during visit. Family at bedside. Spoke to them for the first time. They are in good spirits. Trach placement planned for tomorrow 10/17. Plan to place Peg as well. PT/OT consulted and following.   O: BP (!) 142/79   Pulse 83   Temp 98.9 F (37.2 C) (Axillary)   Resp (!) 26   Ht 5\' 4"  (1.626 m)   Wt 125.3 kg   LMP 07/26/2018   SpO2 98%   BMI 47.42 kg/m     A/P: Will continue to follow  Danna Hefty, DO 08/21/2018, 5:58 AM PGY-1, [place] Medicine Service pager [phone]

## 2018-08-21 NOTE — Progress Notes (Signed)
STROKE TEAM PROGRESS NOTE   SUBJECTIVE (INTERVAL HISTORY) Her  RN   is at bedside.  Pt neuro stable overnight.   She is awake, alert, following commands.  Plan for early trach and PEG this Friday.  Patient  Neuro exam is unchanged.  Glucose still high, increased Lantus dose.  BP better this morning after, metoprolol changed to Coreg.  OBJECTIVE Vitals:   08/21/18 0900 08/21/18 1000 08/21/18 1100 08/21/18 1121  BP: (!) 159/91 138/80 (!) 151/83 (!) 151/53  Pulse: 85 74 76 77  Resp: (!) 22 (!) 22 (!) 26 (!) 26  Temp:      TempSrc:      SpO2: 99% 96% 99%   Weight:      Height:        CBC:  Recent Labs  Lab 08/15/18 0512  08/20/18 0358 08/21/18 0306  WBC 11.1*   < > 12.7* 11.3*  NEUTROABS 9.1*  --   --   --   HGB 9.7*   < > 9.4* 8.7*  HCT 32.4*   < > 32.8* 30.7*  MCV 77.1*   < > 79.6* 80.8  PLT 409*   < > 489* 491*   < > = values in this interval not displayed.    Basic Metabolic Panel:  Recent Labs  Lab 08/16/18 1423 08/17/18 0502  08/18/18 0626 08/19/18 0718  NA 136 140   < > 143 146*  K 3.9 3.8   < > 3.3* 3.4*  CL 113* 113*   < > 116* 116*  CO2 18* 18*   < > 21* 20*  GLUCOSE 209* 196*   < > 206* 276*  BUN 20 26*   < > 28* 36*  CREATININE 1.16* 1.03*   < > 0.95 1.07*  CALCIUM 9.5 10.1   < > 10.4* 11.5*  MG 1.6* 1.7  --   --  2.1  PHOS 3.3 3.3  --   --   --    < > = values in this interval not displayed.    Lipid Panel:     Component Value Date/Time   CHOL 256 (H) 08/15/2018 0512   TRIG 248 (H) 08/15/2018 0512   HDL 35 (L) 08/15/2018 0512   CHOLHDL 7.3 08/15/2018 0512   VLDL 50 (H) 08/15/2018 0512   LDLCALC 171 (H) 08/15/2018 0512   HgbA1c:  Lab Results  Component Value Date   HGBA1C 11.4 (H) 08/15/2018   Urine Drug Screen:     Component Value Date/Time   LABOPIA NONE DETECTED 09/04/2018 1836   COCAINSCRNUR NONE DETECTED 08/28/2018 1836   LABBENZ NONE DETECTED 08/31/2018 1836   AMPHETMU NONE DETECTED 08/15/2018 1836   THCU NONE DETECTED  08/15/2018 1836   LABBARB NONE DETECTED 09/02/2018 1836    Alcohol Level No results found for: ETH  IMAGING  Ct Angio Head W Or Wo Contrast Ct Angio Neck W Or Wo Contrast 08/15/2018 IMPRESSION:  1. Interval basilar to left proximal PCA stenting. The density of the stent walls precludes detection of in stent stenosis; there is a degree of wasting at the mid basilar segment. There is flow in the left PCA beyond the stent such that there is presumed stent patency.  2. Known lower pontine infarct. There are new small cerebellar infarcts since brain MRI yesterday.  3. Severe left P2 segment stenosis, also seen previously.  4. Moderate atheromatous narrowing in the proximal left MCA.    Ct Angio Head W Or Wo Contrast Ct Angio  Neck W Or Wo Contrast 08/08/2018 IMPRESSION:  1. 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 No definite acute thrombus identified.  2. Severe stenosis left posterior cerebral artery and moderate stenosis right posterior cerebral artery.  3. Mild stenosis left MCA and moderate stenosis left MCA bifurcation.  4. No significant carotid or vertebral artery stenosis in the neck.    Ct Head Wo Contrast 08/05/2018 IMPRESSION:  No acute intracranial abnormalities. Mild white matter changes likely due to small vessel ischemia.    Mr Brain Wo Contrast 08/09/2018 IMPRESSION:  1. Acute nonhemorrhagic infarct involving the left paramedian brainstem. The infarct crosses midline.  2. Other periventricular and subcortical white matter disease is moderately advanced for age. This likely reflects the sequela of chronic microvascular ischemia.  3. Tapering of the dens with prominent soft tissue pannus. 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    Ct Head Code Stroke Wo Contrast 08/30/2018  IMPRESSION:  1. No acute abnormality and no change from earlier today  2. ASPECTS is 10 3.    Portable Chest  X-ray 08/12/2018 IMPRESSION:  Interval placement of endotracheal tube. Question developing perihilar edema or infectious process. Gaseous distension of the stomach.   Cerebral Angiogram 08/15/2018 S/P 4 vessel cerebral arteriogram RT CFA approach. 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 S/P stent assisted angioplasty of distal basilar artery with patency of 80 to 90 %. 2.Approx 70 % stenosis of LT ICA supraclinoid seg   MRI 08/15/18 1. Progressive acute pontine infarct. New patchy bilateral cerebellar infarction. New tiny left thalamic infarcts. 2. Preserved flow void in the stented basilar. 3. left facial/submandibular edema, please correlate with neck exam. Edited result: IMPRESSION: 1. Progressive acute pontine infarct. New patchy bilateral cerebellar infarction. New tiny left thalamic infarcts. 2. Preserved flow void in the stented basilar.  TTE - Left ventricle: The cavity size was normal. There was severe   concentric hypertrophy. Systolic function was normal. The   estimated ejection fraction was in the range of 60% to 65%. Wall   motion was normal; there were no regional wall motion   abnormalities. Doppler parameters are consistent with abnormal   left ventricular relaxation (grade 1 diastolic dysfunction). The   E/e&' ratio is <8, suggesting normal LV filling pressure. - Aortic valve: Trileaflet; mildly calcified leaflets.   Transvalvular velocity was minimally increased. There was no   regurgitation. Mean gradient (S): 12 mm Hg. - Mitral valve: Mildly thickened leaflets . There was trivial   regurgitation. - Left atrium: The atrium was normal in size. - Inferior vena cava: The vessel was dilated. The respirophasic   diameter changes were blunted (< 50%), consistent with elevated   central venous pressure. Impressions: - LVEF 60-65%, severe LVH, normal wall  motion, grade 1 DD, normal   LV filling pressure, minimally increased aortic velocity without   signficant stenosis, trivial MR, normal LA size, dilated IVC.   PHYSICAL EXAM  Temp:  [98.8 F (37.1 C)-99.7 F (37.6 C)] 99.5 F (37.5 C) (10/17 0856) Pulse Rate:  [74-99] 77 (10/17 1121) Resp:  [19-29] 26 (10/17 1121) BP: (110-185)/(53-98) 151/53 (10/17 1121) SpO2:  [96 %-100 %] 99 % (10/17 1100) FiO2 (%):  [30 %] 30 % (10/17 1121)  General -  Obese middle-aged African-American Andrea [name], still intubated    Ophthalmologic - fundi not visualized due to noncooperation.  Cardiovascular - Regular rate and rhythm.  Neuro - intubated. Not sedated.awake, alert, eyes open, still intubated, not on sedation. No ptosis. Right gaze incomplete with rotational nystagmus. PERRL, EOMI. Corneal, gag and cough present. Facial symmetry and tongue not able to test due to ET tube.. Left arm moves slightly to command as does the LLE  able to wiggle toes and move back and forth, left positive babinski, right negative babinski. DTR 1+. Sensation not cooperative, coordination and gait not tested.   ASSESSMENT/PLAN Andrea [name] is a 57 y.o. female with history of difficult to control hypertension, insulin-dependent diabetes, obesity, hypothyroidism status post ablation presenting with chest discomfort, right-sided weakness, slurred speech and right facial droop. She did not receive IV t-PA due to late presentation. S/P stent assisted angioplasty of distal basilar artery.  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 Worsening symptoms with extension of pontine/medullary infarcts with new small bilateral cerebellar infarcts without evidence of stent re-stenosis or occlusion  Resultant b// UE and LE weakness, eyes minimally disconjugate but improving, nystagmus  CT head - No acute intracranial abnormalities.  CTA H&N 09/03/2018 - Extensive atherosclerotic disease in  the basilar with focal critical stenosis in the mid to distal basilar. No definite acute thrombus identified. Severe stenosis left posterior cerebral artery  MRI head - Acute nonhemorrhagic infarct involving the left paramedian brainstem.   CTA H&N 08/15/2018 - stent too dense to see BA lumen, but distal flow preserved. New small cerebellar infarcts since brain MRI yesterday.  Severe left P2 segment stenosis, also seen previously.   Repeat MRI - extension of b/l pontine and upper medullary infarcts with new b/l cerebellar infarcts.  2D Echo - EF 60-65%  LDL - 174  HgbA1c - 11.1  P2Y12 = 35  VTE prophylaxis - heparin subq  Diet - NPO  aspirin 81 mg daily prior to admission, was on Brilinta 90 mg daily and ASA 81 mg daily.  Brilinta currently on hold since 08/19/18 for elective PEG tube and tracheostomy this Friday. Currently on iv heparin till procedures  Patient counseled to be compliant with her antithrombotic medications  Ongoing aggressive stroke risk factor management  Therapy recommendations:  pending  Disposition:  Pending  Intracranial stenosis  BA mid to distal severe stenosis s/p BA stenting  Left PCA severe stenosis, R PCA moderate stenosis  Left MCA moderate stenosis  Uncontrolled stroke risk factors with HLD, HTN, DM  Non compliance with meds at home  Respiratory failure  Intubated  CCM on board  Tolerated weaning female  Plan for trach Friday  Hypertension  Stable, on the high end  BP goal 130-160   On po norvasc, losartan  Metoprolol changed to Coreg 25 bid  Avoid low BP  Hyperlipidemia  Lipid lowering medication PTA:  Lipitor 20 mg daily  LDL 174, goal < 70  Increase to 80 mg daily  Continue statin at discharge  Diabetes  HgbA1c 11.4, goal < 7.0  Uncontrolled  Hyperglycemia   Lantus dose increased  Continue basal NovoLog  SSI  CBG monitoring  DM coordinator on board  Dysphagia  On tube feeding 55 cc/h  IV  fluids 30 cc/h  Plan for PEG tube Friday  Other Stroke Risk Factors  Obesity, Body mass index is 47.42 kg/m., recommend weight loss, diet and exercise as appropriate   Screen for sleep apnea when appropriate and may benefit from referral to [facility] team  Other Active Problems  Anemia  due to acute blood loss - 11.1->9.7->9.4->9.3->9.4  Elevated creatinine - 1.04->1.13->1.03->1.07  Hypokalemia - 3.1 -> supplement->3.7->3.8->3.4  Leukocytosis - 9.4->11.1->9.6->11.3->12.7  Hospital day # 7 Discussed with Dr. Kipp Brood , MD critical carel care medicine. Plan for tracheostomy tomorrow and PEG tube. No family available at the bedside for discussion. This patient is critically ill due to worsening brainstem infarct, BA stenosis s/p stenting, hyperglycemia, hypertensive and at significant risk of neurological worsening, death form recurrent stroke, hemorrhagic conversion, CHF, seizure, aspiration. This patient's care requires constant monitoring of vital signs, hemodynamics, respiratory and cardiac monitoring, review of multiple databases, neurological assessment, discussion with family, other specialists and medical decision making of high complexity. I spent 32 minutes of neurocritical care time in the care of this patient.  Antony Contras, MD Stroke Neurology 08/21/2018 11:24 AM    To contact Stroke Continuity provider, please refer to [email or web address] After hours, contact General Neurology

## 2018-08-21 NOTE — Progress Notes (Signed)
ANTICOAGULATION CONSULT NOTE  Pharmacy Consult:  Heparin Indication: Intracerebral stent  Allergies  Allergen Reactions  . Tramadol     "Makes me sees things and I just dont act right"    Patient Measurements: Height: 5\' 4"  (162.6 cm) Weight: 276 lb 3.8 oz (125.3 kg) IBW/kg (Calculated) : 54.7 Heparin Dosing Weight: 85 kg  Vital Signs: Temp: 98.9 F (37.2 C) (10/17 1200) Temp Source: Axillary (10/17 1200) BP: 144/89 (10/17 1200) Pulse Rate: 79 (10/17 1200)  Labs: Recent Labs    08/19/18 0718  08/20/18 0358 08/21/18 0306 08/21/18 1224  HGB 9.6*  --  9.4* 8.7*  --   HCT 33.8*  --  32.8* 30.7*  --   PLT 419*  --  489* 491*  --   HEPARINUNFRC  --    < > 0.11* <0.10* 0.15*  CREATININE 1.07*  --   --   --   --    < > = values in this interval not displayed.    Estimated Creatinine Clearance: 75.9 mL/min (A) (by C-G formula based on SCr of 1.07 mg/dL (H)).    Assessment: 77 YOF with recent intracerebral stent to continue IV heparin as bridge while Brilinta is on hold.  Heparin level is therapeutic; no bleeding reported.  Plan for trach/PEG on Monday.   Goal of Therapy:  Heparin level 0.1 - 0.25 units/ml Monitor platelets by anticoagulation protocol: Yes    Plan:  Continue heparin gtt at 850 units/hr Daily heparin level and CBC   Sherryann Frese D. Mina Marble, PharmD, BCPS, [place] 08/21/2018, 1:02 PM

## 2018-08-21 NOTE — Consult Note (Signed)
[facility] Surgery Consult Note  Andrea [name] [birth year]  [id number].    Requesting MD: Kipp Brood Chief Complaint/Reason for Consult: PEG  HPI:  Andrea [name] is a 57yo female PMH poorly controlled HTN and type 2 DM admitted to [facility] 10/10 with chest pain and left arm pain. Code stroke initiated for R hemiplegia, dysarthria and right facial droop. 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 Patient was intubated and taken to IR for stenting and angioplasty with 80-90% patency. She received Brilinta once on 10/10, and has only been on ASA 81mg  since that time. Trauma consulted for PEG placement. Prior h/o c section x2.   ROS: unable to assess ROS due to altered mental status    Family History  Problem Relation Age of Onset  . Pneumonia Mother   . Diabetes Mother   . Hypertension Father   . Hypertension Sister   . Hyperlipidemia Sister   . Hypertension Brother   . Hypertension Maternal Aunt   . Hypertension Paternal Aunt   . Cancer Paternal Aunt        throat cancer  . Diabetes Maternal Grandmother   . Diabetes Paternal Grandmother   . Cancer Paternal Grandmother        pancreatic cancer    Past Medical History:  Diagnosis Date  . Diabetes mellitus   . Environmental allergies   . Fibroid   . High cholesterol   . Hx of cardiovascular stress test    a. ETT-MV 2/14:  EF 46% (normal by echo), low risk, no ischemia or scar  . Hx of echocardiogram    Echo 2/14: mild LVH, EF 55-60%, Gr 1 diast dysfn, mild LAE  . Hypertension   . Hyperthyroidism    Radioactive iodine ablation    Past Surgical History:  Procedure Laterality Date  . [place], 2001   x 2  . IR ANGIO INTRA EXTRACRAN SEL COM CAROTID INNOMINATE BILAT MOD SED  08/22/2018  . IR ANGIO VERTEBRAL SEL SUBCLAVIAN INNOMINATE UNI R MOD SED  08/16/2018  . IR CT HEAD LTD  08/30/2018  . IR INTRA CRAN STENT  08/31/2018  . RADIOLOGY WITH  ANESTHESIA N/A 08/13/2018   Procedure: IR WITH ANESTHESIA;  Surgeon: Luanne Bras, MD;  Location: [place];  Service: Radiology;  Laterality: N/A;    Social History:  reports that she has never smoked. She has never used smokeless tobacco. She reports that she does not drink alcohol or use drugs.  Allergies:  Allergies  Allergen Reactions  . Tramadol     "Makes me sees things and I just dont act right"    Medications Prior to Admission  Medication Sig Dispense Refill  . amLODipine (NORVASC) 10 MG tablet Take 1 tablet (10 mg total) by mouth daily. 90 tablet 3  . aspirin EC 81 MG tablet Take 81 mg by mouth daily.    Marland Kitchen atorvastatin (LIPITOR) 20 MG tablet Take 1 tablet (20 mg total) by mouth daily. 90 tablet 3  . benzonatate (TESSALON) 100 MG capsule Take 1 capsule (100 mg total) by mouth every 8 (eight) hours. 21 capsule 0  . cloNIDine (CATAPRES) 0.2 MG tablet Take 1.5 tablets (0.3 mg total) by mouth 2 (two) times daily. 60 tablet 0  . ferrous sulfate 325 (65 FE) MG EC tablet Take 1 tablet (325 mg total) by mouth 3 (three) times daily with meals. 100 tablet 3  . gabapentin (NEURONTIN) 300 MG capsule  Take 1 capsule (300 mg total) by mouth 3 (three) times daily. (Patient taking differently: Take 300 mg by mouth 3 (three) times daily as needed (nerve pain). ) 90 capsule 0  . hydrochlorothiazide (HYDRODIURIL) 25 MG tablet TAKE 1 TABLET (25 MG TOTAL) BY MOUTH DAILY. 90 tablet 2  . ibuprofen (ADVIL,MOTRIN) 800 MG tablet Take 1 tablet (800 mg total) by mouth every 8 (eight) hours as needed. (Patient taking differently: Take 800 mg by mouth every 8 (eight) hours as needed for headache or mild pain. ) 21 tablet 0  . Insulin Glargine (LANTUS SOLOSTAR) 100 UNIT/ML Solostar Pen INJECT 27 UNITS INTO THE SKIN AT BEDTIME. 15 pen 3  . losartan (COZAAR) 25 MG tablet Take 1 tablet (25 mg total) by mouth daily. 90 tablet 3  . megestrol (MEGACE) 40 MG tablet TAKE 2 TABLETS BY MOUTH TWICE A DAY , MAY INCREASE  TO 3 TABLETS TWICE DAILY FOR HEAVY BLEEDING (Patient taking differently: Take 80 mg by mouth 2 (two) times daily. May increase to 3 tablets twice daily for heavy bleeding) 120 tablet 4  . metFORMIN (GLUCOPHAGE) 1000 MG tablet Take 1 tablet (1,000 mg total) by mouth 2 (two) times daily with a meal. 180 tablet 3  . metoprolol succinate (TOPROL XL) 50 MG 24 hr tablet Take 1 tablet (50 mg total) by mouth daily. Take with or immediately following a meal. 90 tablet 3  . VOLTAREN 1 % GEL APPLY 2 G TOPICALLY 4 (FOUR) TIMES DAILY. (Patient taking differently: Apply 2 g topically 4 (four) times daily as needed (pain). ) 100 g 2  . Insulin Pen Needle (B-D ULTRAFINE III SHORT PEN) 31G X 8 MM MISC USE AS DIRECTED TO INJECT INSULIN AT BEDTIME 100 each 2  . Insulin Syringe-Needle U-100 (INSULIN SYRINGE .5CC/30GX1/2") 30G X 1/2" 0.5 ML MISC Use to inject insulin daily as prescribed 100 each 3    Prior to Admission medications   Medication Sig Start Date End Date Taking? Authorizing Provider  amLODipine (NORVASC) 10 MG tablet Take 1 tablet (10 mg total) by mouth daily. 05/16/18  Yes Elsie Stain, MD  aspirin EC 81 MG tablet Take 81 mg by mouth daily.   Yes [provider]  atorvastatin (LIPITOR) 20 MG tablet Take 1 tablet (20 mg total) by mouth daily. 01/29/18  Yes Verner Mould, MD  benzonatate (TESSALON) 100 MG capsule Take 1 capsule (100 mg total) by mouth every 8 (eight) hours. 12/16/17  Yes Maczis, Barth Kirks, PA-C  cloNIDine (CATAPRES) 0.2 MG tablet Take 1.5 tablets (0.3 mg total) by mouth 2 (two) times daily. 05/16/18  Yes Elsie Stain, MD  ferrous sulfate 325 (65 FE) MG EC tablet Take 1 tablet (325 mg total) by mouth 3 (three) times daily with meals. 01/17/17  Yes Hensel, Jamal Collin, MD  gabapentin (NEURONTIN) 300 MG capsule Take 1 capsule (300 mg total) by mouth 3 (three) times daily. Patient taking differently: Take 300 mg by mouth 3 (three) times daily as needed (nerve pain).   12/26/16  Yes Lysbeth Penner, FNP  hydrochlorothiazide (HYDRODIURIL) 25 MG tablet TAKE 1 TABLET (25 MG TOTAL) BY MOUTH DAILY. 05/16/18  Yes Elsie Stain, MD  ibuprofen (ADVIL,MOTRIN) 800 MG tablet Take 1 tablet (800 mg total) by mouth every 8 (eight) hours as needed. Patient taking differently: Take 800 mg by mouth every 8 (eight) hours as needed for headache or mild pain.  01/19/17  Yes Lawyer, Harrell Gave, PA-C  Insulin Glargine (LANTUS SOLOSTAR)  100 UNIT/ML Solostar Pen INJECT 27 UNITS INTO THE SKIN AT BEDTIME. 05/16/18  Yes Elsie Stain, MD  losartan (COZAAR) 25 MG tablet Take 1 tablet (25 mg total) by mouth daily. 05/16/18  Yes Elsie Stain, MD  megestrol (MEGACE) 40 MG tablet TAKE 2 TABLETS BY MOUTH TWICE A DAY , MAY INCREASE TO 3 TABLETS TWICE DAILY FOR HEAVY BLEEDING Patient taking differently: Take 80 mg by mouth 2 (two) times daily. May increase to 3 tablets twice daily for heavy bleeding 11/26/17  Yes Anyanwu, Sallyanne Havers, MD  metFORMIN (GLUCOPHAGE) 1000 MG tablet Take 1 tablet (1,000 mg total) by mouth 2 (two) times daily with a meal. 05/16/18  Yes Elsie Stain, MD  metoprolol succinate (TOPROL XL) 50 MG 24 hr tablet Take 1 tablet (50 mg total) by mouth daily. Take with or immediately following a meal. 05/16/18  Yes Elsie Stain, MD  VOLTAREN 1 % GEL APPLY 2 G TOPICALLY 4 (FOUR) TIMES DAILY. Patient taking differently: Apply 2 g topically 4 (four) times daily as needed (pain).  01/16/17  Yes Bacigalupo, Dionne Bucy, MD  Insulin Pen Needle (B-D ULTRAFINE III SHORT PEN) 31G X 8 MM MISC USE AS DIRECTED TO INJECT INSULIN AT BEDTIME 01/15/18   Verner Mould, MD  Insulin Syringe-Needle U-100 (INSULIN SYRINGE .5CC/30GX1/2") 30G X 1/2" 0.5 ML MISC Use to inject insulin daily as prescribed 01/23/17   Virginia Crews, MD  sertraline (ZOLOFT) 50 MG tablet Take 50 mg by mouth daily.  01/19/12  [provider]    Blood pressure (!) 159/91, pulse 85, temperature  99.5 F (37.5 C), temperature source Axillary, resp. rate (!) 22, height 5\' 4"  (1.626 m), weight 125.3 kg, last menstrual period 07/26/2018, SpO2 99 %. Physical Exam: General: WD/WN AA female who is laying in bed in NAD, ventilated HEENT: head is normocephalic, atraumatic.  Sclera are noninjected.  PERRL.  Ears and nose without any masses or lesions.  ETT and OG in place. Mouth is dry. Dentition fair Heart: regular, rate, and rhythm.  2+ DP pulses bilatearlly Lungs: no wheezes, diffuse rhonchi bilaterally.  Mechanically ventilated Abd: obese, soft, NT/ND, +BS in all 4 quadrants, no masses, hernias, or organomegaly MS: all 4 extremities are symmetrical with no cyanosis or clubbing. 1+ generalized edema BUE/BLE Skin: warm and dry with no masses, lesions, or rashes Neuro: alert, nods appropriately to questions. Follows commands with facial expressions (opens eyes, closes eyes, EOMs). Does not move RUE/RLE. Will not move LUE/LLE for me, but per RN patient will sometimes follow some commands with left sided extremities  Results for orders placed or performed during the hospital encounter of 08/19/2018 (from the past 48 hour(s))  Glucose, capillary     Status: Abnormal   Collection Time: 08/19/18 11:52 AM  Result Value Ref Range   Glucose-Capillary 271 (H) 70 - 99 mg/dL   Comment 1 Notify RN    Comment 2 Document in Chart   Glucose, capillary     Status: Abnormal   Collection Time: 08/19/18  3:59 PM  Result Value Ref Range   Glucose-Capillary 241 (H) 70 - 99 mg/dL   Comment 1 Notify RN    Comment 2 Document in Chart   Heparin level (unfractionated)     Status: Abnormal   Collection Time: 08/19/18  6:28 PM  Result Value Ref Range   Heparin Unfractionated 0.12 (L) 0.30 - 0.70 IU/mL    Comment: (NOTE) If heparin results are below expected values, and patient  dosage has  been confirmed, suggest follow up testing of antithrombin III levels. Performed at [facility], [place] [address]  [place], [place] [address]   Glucose, capillary     Status: Abnormal   Collection Time: 08/19/18  7:48 PM  Result Value Ref Range   Glucose-Capillary 266 (H) 70 - 99 mg/dL  Glucose, capillary     Status: Abnormal   Collection Time: 08/19/18 11:48 PM  Result Value Ref Range   Glucose-Capillary 278 (H) 70 - 99 mg/dL  Heparin level (unfractionated)     Status: Abnormal   Collection Time: 08/20/18  3:58 AM  Result Value Ref Range   Heparin Unfractionated 0.11 (L) 0.30 - 0.70 IU/mL    Comment: (NOTE) If heparin results are below expected values, and patient dosage has  been confirmed, suggest follow up testing of antithrombin III levels. Performed at [facility], [place] [address] [place], [place] [address]   CBC     Status: Abnormal   Collection Time: 08/20/18  3:58 AM  Result Value Ref Range   WBC 12.7 (H) 4.0 - 10.5 K/uL   RBC 4.12 3.87 - 5.11 MIL/uL   Hemoglobin 9.4 (L) 12.0 - 15.0 g/dL   HCT 32.8 (L) 36.0 - 46.0 %   MCV 79.6 (L) 80.0 - 100.0 fL   MCH 22.8 (L) 26.0 - 34.0 pg   MCHC 28.7 (L) 30.0 - 36.0 g/dL   RDW 16.1 (H) 11.5 - 15.5 %   Platelets 489 (H) 150 - 400 K/uL   nRBC 0.2 0.0 - 0.2 %    Comment: Performed at [facility], [place] [address], [place], [place] [address]  Glucose, capillary     Status: Abnormal   Collection Time: 08/20/18  4:07 AM  Result Value Ref Range   Glucose-Capillary 233 (H) 70 - 99 mg/dL  Glucose, capillary     Status: Abnormal   Collection Time: 08/20/18  8:41 AM  Result Value Ref Range   Glucose-Capillary 188 (H) 70 - 99 mg/dL   Comment 1 Notify RN    Comment 2 Document in Chart   Glucose, capillary     Status: Abnormal   Collection Time: 08/20/18 12:10 PM  Result Value Ref Range   Glucose-Capillary 244 (H) 70 - 99 mg/dL   Comment 1 Notify RN    Comment 2 Document in Chart   Glucose, capillary     Status: Abnormal   Collection Time: 08/20/18  3:48 PM  Result Value Ref Range   Glucose-Capillary 215 (H) 70 - 99 mg/dL   Comment 1  Notify RN    Comment 2 Document in Chart   Glucose, capillary     Status: Abnormal   Collection Time: 08/20/18  8:00 PM  Result Value Ref Range   Glucose-Capillary 182 (H) 70 - 99 mg/dL  Glucose, capillary     Status: Abnormal   Collection Time: 08/21/18 12:09 AM  Result Value Ref Range   Glucose-Capillary 190 (H) 70 - 99 mg/dL  Heparin level (unfractionated)     Status: Abnormal   Collection Time: 08/21/18  3:06 AM  Result Value Ref Range   Heparin Unfractionated <0.10 (L) 0.30 - 0.70 IU/mL    Comment: (NOTE) If heparin results are below expected values, and patient dosage has  been confirmed, suggest follow up testing of antithrombin III levels. Performed at [facility], [place] [address] [place], [place] [address]   CBC     Status:  Abnormal   Collection Time: 08/21/18  3:06 AM  Result Value Ref Range   WBC 11.3 (H) 4.0 - 10.5 K/uL   RBC 3.80 (L) 3.87 - 5.11 MIL/uL   Hemoglobin 8.7 (L) 12.0 - 15.0 g/dL   HCT 30.7 (L) 36.0 - 46.0 %   MCV 80.8 80.0 - 100.0 fL   MCH 22.9 (L) 26.0 - 34.0 pg   MCHC 28.3 (L) 30.0 - 36.0 g/dL   RDW 16.2 (H) 11.5 - 15.5 %   Platelets 491 (H) 150 - 400 K/uL   nRBC 0.3 (H) 0.0 - 0.2 %    Comment: Performed at [place] [address], [place], [place] [address]  Glucose, capillary     Status: Abnormal   Collection Time: 08/21/18  3:56 AM  Result Value Ref Range   Glucose-Capillary 190 (H) 70 - 99 mg/dL  Glucose, capillary     Status: Abnormal   Collection Time: 08/21/18  8:41 AM  Result Value Ref Range   Glucose-Capillary 156 (H) 70 - 99 mg/dL   No results found.  Anti-infectives (From admission, onward)   Start     Dose/Rate Route Frequency Ordered Stop   08/10/2018 1014  ceFAZolin (ANCEF) 2-4 GM/100ML-% IVPB    Note to Pharmacy:  Roma Kayser  : cabinet override      08/28/2018 1014 08/18/2018 2229       Assessment/Plan Acute ischemic pontine stroke - last dose Brilinta 10/10, continue to hold Stent placement interval  basilar to left proximal PCA Vent dependent respiratory failure  HTN Type 2 DM Hypothyroidism  Dysphagia - Tolerating tube feeds well. Will plan for bedside PEG on 10/21 with Dr. Hulen Skains, and will coordinate with CCM who will perform bedside trach the same day. Please continue to hold Brilinta. Will plan to hold IV heparin on 10/21 at 0600.  ID - none currently VTE - SCDs, IV heparin FEN - IVF, NPO, TF Foley - in place  Wellington Hampshire, [facility] 08/21/2018, 9:55 AM Pager: [phone] Mon 7:00 am -11:30 AM Tues-Fri 7:00 am-4:30 pm Sat-Sun 7:00 am-11:30 am

## 2018-08-22 LAB — CBC WITH DIFFERENTIAL/PLATELET
ABS IMMATURE GRANULOCYTES: 0.09 10*3/uL — AB (ref 0.00–0.07)
Basophils Absolute: 0 10*3/uL (ref 0.0–0.1)
Basophils Relative: 0 %
Eosinophils Absolute: 0 10*3/uL (ref 0.0–0.5)
Eosinophils Relative: 0 %
HCT: 30.7 % — ABNORMAL LOW (ref 36.0–46.0)
Hemoglobin: 8.7 g/dL — ABNORMAL LOW (ref 12.0–15.0)
Immature Granulocytes: 1 %
Lymphocytes Relative: 19 %
Lymphs Abs: 2 10*3/uL (ref 0.7–4.0)
MCH: 23.5 pg — AB (ref 26.0–34.0)
MCHC: 28.3 g/dL — ABNORMAL LOW (ref 30.0–36.0)
MCV: 83 fL (ref 80.0–100.0)
MONO ABS: 1.3 10*3/uL — AB (ref 0.1–1.0)
Monocytes Relative: 12 %
NEUTROS ABS: 6.9 10*3/uL (ref 1.7–7.7)
NEUTROS PCT: 68 %
PLATELETS: 462 10*3/uL — AB (ref 150–400)
RBC: 3.7 MIL/uL — AB (ref 3.87–5.11)
RDW: 16.4 % — ABNORMAL HIGH (ref 11.5–15.5)
WBC: 10.3 10*3/uL (ref 4.0–10.5)
nRBC: 0.3 % — ABNORMAL HIGH (ref 0.0–0.2)

## 2018-08-22 LAB — BASIC METABOLIC PANEL
Anion gap: 10 (ref 5–15)
BUN: 52 mg/dL — AB (ref 6–20)
CO2: 22 mmol/L (ref 22–32)
CREATININE: 1.41 mg/dL — AB (ref 0.44–1.00)
Calcium: 12.5 mg/dL — ABNORMAL HIGH (ref 8.9–10.3)
Chloride: 123 mmol/L — ABNORMAL HIGH (ref 98–111)
GFR calc Af Amer: 47 mL/min — ABNORMAL LOW (ref >60)
GFR, EST NON AFRICAN AMERICAN: 40 mL/min — AB (ref >60)
Glucose, Bld: 137 mg/dL — ABNORMAL HIGH (ref 70–99)
POTASSIUM: 3.8 mmol/L (ref 3.5–5.1)
Sodium: 155 mmol/L — ABNORMAL HIGH (ref 135–145)

## 2018-08-22 LAB — GLUCOSE, CAPILLARY
GLUCOSE-CAPILLARY: 129 mg/dL — AB (ref 70–99)
Glucose-Capillary: 100 mg/dL — ABNORMAL HIGH (ref 70–99)
Glucose-Capillary: 187 mg/dL — ABNORMAL HIGH (ref 70–99)
Glucose-Capillary: 169 mg/dL — ABNORMAL HIGH (ref 70–99)
Glucose-Capillary: 176 mg/dL — ABNORMAL HIGH (ref 70–99)
Glucose-Capillary: 183 mg/dL — ABNORMAL HIGH (ref 70–99)

## 2018-08-22 LAB — HEPARIN LEVEL (UNFRACTIONATED): Heparin Unfractionated: 0.21 IU/mL — ABNORMAL LOW (ref 0.30–0.70)

## 2018-08-22 MED ORDER — INSULIN GLARGINE 100 UNIT/ML ~~LOC~~ SOLN: 25.0000 [IU] | Freq: Two times a day (BID) | SUBCUTANEOUS | Status: DC

## 2018-08-22 MED ORDER — FREE WATER
200.0000 mL | Freq: Three times a day (TID) | Status: DC
  Administered 2018-08-22 – 2018-08-23 (×3): 200 mL

## 2018-08-22 MED ORDER — INSULIN GLARGINE 100 UNIT/ML ~~LOC~~ SOLN
25.0000 [IU] | Freq: Two times a day (BID) | SUBCUTANEOUS | Status: AC
  Administered 2018-08-22 – 2018-08-24 (×4): 25 [IU] via SUBCUTANEOUS
  Filled 2018-08-22 (×4): qty 0.25

## 2018-08-22 MED ORDER — INSULIN GLARGINE 100 UNIT/ML ~~LOC~~ SOLN
15.0000 [IU] | Freq: Two times a day (BID) | SUBCUTANEOUS | Status: DC
  Administered 2018-08-24: 15 [IU] via SUBCUTANEOUS
  Filled 2018-08-22 (×2): qty 0.15

## 2018-08-22 NOTE — Progress Notes (Signed)
FPTS Social Progress Note  S: Patient has done well overnight. No significant change in movement ability. Has some movement of left side and right LE, however no movement of her right UE. Still unable to move against gravity in all extremities. Peg and trach planned for Monday. Per family, patient has history of fibroids and per nurse, patient is currently having menstrual bleeding.  O: Awake and alert lying comfortable in bed, family at bedside BP 134/71   Pulse 73   Temp (!) 101 F (38.3 C) (Axillary)   Resp (!) 24   Ht 5\' 4"  (1.626 m)   Wt 121.8 kg   LMP 07/26/2018   SpO2 98%   BMI 46.09 kg/m     A/P: Will follow labs and continue to monitor   Danna Hefty, DO 08/22/2018, 6:35 AM PGY-1, [facility] pager [phone]

## 2018-08-22 NOTE — Progress Notes (Signed)
NAME:  Andrea [name], MRN:  [id number], DOB:  06-21-1961, LOS: 8 ADMISSION DATE:  08/15/2018, CONSULTATION DATE:  08/25/2018 REFERRING MD:  Dr. Rory Percy, CHIEF COMPLAINT:  CP/ Acute CVA  Brief History   46 yoF w/poorly controlled HTN and IDDM presenting with CP and left arm pain w/BP 220/117. Initial CTH neg.  Cardiac workup negative for acute event thus far, cardiology following.  Code stroke initiated for R hemiplegia, dysarthria and right facial droop.  Out of window for TPA.  Requiring cleviprex for BP control.  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  Evolving pontine stroke on MRI. Intubated for neuro IR s/p stenting and angioplasty with 80-90% patency.  Extubated post procedure but due to insufficency placed on BiPAP and brought to ICU.    Past Medical History  HTN, IDDM, hypothyroidism s/p ablation  Significant Hospital Events   10/9 present to [facility] ED -transferred to [facility] 10/9 basilar artery stenting 10/9 intubated urgently 6h post procedure for inability to protect airway. 10/10 CTA for declining exam - evolving pontine infarct, stent deemed to be patent 10/14 Brillinta stopped and ASA/ heparin started to in anticipation of tracheostomy on Monday.  Consults: date of consult/date signed off & final recs:  Cards 10/10 Neurology 10/10 PCCM 10/10  Procedures (surgical and bedside):  10/10 Neuro IR >> s/p stent and angioplasty of distal basilar artery  10/10 ETT for procedure 10/10 ETT (reintubated) >> 62mm  Significant Diagnostic Tests:  08/12/2018  CTH >>  No acute intracranial abnormalities. Mild white matter changes likely due to small vessel ischemia  08/1018 CTH >> 1. No acute abnormality and no change from earlier today 2. ASPECTS is 10  08/23/2018  CTA head and neck >>  1. 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 No definite acute thrombus identified. 2. Severe stenosis left  posterior cerebral artery and moderate stenosis right posterior cerebral artery. 3. Mild stenosis left MCA and moderate stenosis left MCA bifurcation. 4. No significant carotid or vertebral artery stenosis in the neck.  09/04/2018 MRI - evolving pontine infarct.  08/15/18  Echocardiogram - normal LVSF with severe LVH.   Micro Data:  08/17/2018 MRSA PCR >>negative  Antimicrobials:  10/10 cefazolin preop  Subjective:   Able to communicate by facial expressions but consistently weak in the extremities per RN.  Received Norco once for headache with relief.    Objective   Blood pressure 123/71, pulse 69, temperature 99.9 F (37.7 C), temperature source Axillary, resp. rate (!) 25, height 5\' 4"  (1.626 m), weight 121.8 kg, last menstrual period 07/26/2018, SpO2 97 %.    Vent Mode: PSV;CPAP FiO2 (%):  [30 %] 30 % Set Rate:  [15 bmp] 15 bmp Vt Set:  [430 mL] 430 mL PEEP:  [5 cmH20] 5 cmH20 Pressure Support:  [10 cmH20] 10 cmH20 Plateau Pressure:  [12 cmH20-16 cmH20] 14 cmH20   Intake/Output Summary (Last 24 hours) at 08/22/2018 1040 Last data filed at 08/22/2018 0900 Gross per 24 hour  Intake 2780.58 ml  Output 1700 ml  Net 1080.58 ml   Filed Weights   08/19/18 0615 08/20/18 0500 08/22/18 0400  Weight: 127.2 kg 125.3 kg 121.8 kg    Examination: General: Obese woman, ventilated HEENT: Endotracheal tube in place, OG tube in place. Neuro: Exam unchanged wakes easily to voice, able to nod to questions. Did not move limbs for me. PULM:  no dyssynchrony, symmetric chest rise, chest clear  and on PSV 8/5 CV: Distant, regular, no murmur. Hypertensive now well controlled. GI: Obese, soft, nondistended, positive bowel sounds, tolerating tube feeds. Extremities: 2+ generalized edema. Skin: IV sites intact. GU: improved urine output, clear urine.  Resolved Hospital Problem list    Assessment & Plan:   Acute ischemic pontine stroke with stent placement in basilar to left proximal PCA.  Remains critically ill for this reason. Resulting in inadequate airway protection, now inability to follow commands in the extremities.   Swallowing dysfunction is not expected to improve sufficiently to permit extubation so tracheostomy and PEG tube are planned for Monday 10/21 Have switched to oral pain medications to avoid oversedation. Anticoagulation changed from Brilinta to heparin plus aspirin to facilitate trach placement.  Heparin infusion is in low-dose therapeutic goal. Continue close monitoring of neurological status as at high risk for stent occlusion while on bridging anticoagulation. Have discussed case with trauma surgery and have coordinated to perform tracheostomy and PEG sequentially on Monday. Have order pre-trach order set.  Respiratory Insufficiency due to inability to protect airway from dysarthria.  Expected with location of stroke. Remains critically ill for this reason. Continue current ventilatory support pending tracheostomy.  Hypertensive Crisis Improved blood pressure control. Have reintroduced oral anti-hypertensives. Continue to titrate to keep SBP < 180 acutely and achieve long term goal of <130.  Type 2 diabetes Improved control with current regimen. Will plan for reduced dose of insulin once NPO for tracheostomy.  Hypernatremia, with increased creatinine. Consistent with volume contraction. Increase free water flushes. Follow up electrolytes.  Disposition / Summary of Today's Plan 08/22/18   Continue current supportive care Plan for tracheostomy and PEG tube placement on 08/08/2018  Best Practice  Diet: TF Pain/Anxiety/Delirium protocol (if indicated): N/a VAP protocol (if indicated): no DVT prophylaxis: SCDs  GI prophylaxis: Famotidine Hyperglycemia protocol: SSI, lantus Mobility: bedrest Code Status: Full  Family Communication: Updated the patient's sister and niece at bedside on 10/14  Labs ( personally  Reviewed)  CBC: Recent Labs  Lab  08/18/18 0626 08/19/18 0718 08/20/18 0358 08/21/18 0306 08/22/18 0317  WBC 10.8* 11.0* 12.7* 11.3* 10.3  NEUTROABS  --   --   --   --  6.9  HGB 9.3* 9.6* 9.4* 8.7* 8.7*  HCT 31.8* 33.8* 32.8* 30.7* 30.7*  MCV 78.7* 80.3 79.6* 80.8 83.0  PLT 393 419* 489* 491* 462*    Basic Metabolic Panel: Recent Labs  Lab 08/15/18 1633 08/16/18 0500 08/16/18 1423 08/17/18 0502 08/17/18 1243 08/18/18 0626 08/19/18 0718 08/22/18 0317  NA  --  137 136 140 139 143 146* 155*  K  --  3.7 3.9 3.8 4.0 3.3* 3.4* 3.8  CL  --  111 113* 113* 116* 116* 116* 123*  CO2  --  18* 18* 18* 15* 21* 20* 22  GLUCOSE  --  205* 209* 196* 215* 206* 276* 137*  BUN  --  17 20 26* 27* 28* 36* 52*  CREATININE  --  1.13* 1.16* 1.03* 1.01* 0.95 1.07* 1.41*  CALCIUM  --  9.7 9.5 10.1 10.1 10.4* 11.5* 12.5*  MG 1.7 1.8 1.6* 1.7  --   --  2.1  --   PHOS 3.6 3.2 3.3 3.3  --   --   --   --    GFR: Estimated Creatinine Clearance: 56.6 mL/min (A) (by C-G formula based on SCr of 1.41 mg/dL (H)). Recent Labs  Lab 08/19/18 0718 08/20/18 0358 08/21/18 0306 08/22/18 0317  WBC 11.0* 12.7* 11.3* 10.3  Liver Function Tests: No results for input(s): AST, ALT, ALKPHOS, BILITOT, PROT, ALBUMIN in the last 168 hours. No results for input(s): LIPASE, AMYLASE in the last 168 hours. No results for input(s): AMMONIA in the last 168 hours.  ABG    Component Value Date/Time   PHART 7.334 (L) 08/15/2018 2259   PCO2ART 41.9 08/25/2018 2259   PO2ART 275.0 (H) 08/20/2018 2259   HCO3 22.3 08/11/2018 2259   TCO2 24 08/19/2018 2259   ACIDBASEDEF 3.0 (H) 09/02/2018 2259   O2SAT 100.0 08/11/2018 2259     Coagulation Profile: Recent Labs  Lab 08/18/18 0626  INR 1.12    Cardiac Enzymes: No results for input(s): CKTOTAL, CKMB, CKMBINDEX, TROPONINI in the last 168 hours.  HbA1C: Hgb A1c MFr Bld  Date/Time Value Ref Range Status  08/15/2018 05:12 AM 11.4 (H) 4.8 - 5.6 % Final    Comment:    (NOTE) Pre diabetes:           5.7%-6.4% Diabetes:              >6.4% Glycemic control for   <7.0% adults with diabetes   01/12/2017 02:43 AM 12.8 (H) 4.8 - 5.6 % Final    Comment:    (NOTE)         Pre-diabetes: 5.7 - 6.4         Diabetes: >6.4         Glycemic control for adults with diabetes: <7.0     CBG: Recent Labs  Lab 08/21/18 1600 08/21/18 2000 08/21/18 2307 08/22/18 0316 08/22/18 0724  GLUCAP 207* 165* 149* 129* 100*    CRITICAL CARE Performed by: Kipp Brood   Total critical care time: 35 minutes  Critical care time was exclusive of separately billable procedures and treating other patients.  Critical care was necessary to treat or prevent imminent or life-threatening deterioration.  Critical care was time spent personally by me on the following activities: development of treatment plan with patient and/or surrogate as well as nursing, discussions with consultants, evaluation of patient's response to treatment, examination of patient, obtaining history from patient or surrogate, ordering and performing treatments and interventions, ordering and review of laboratory studies, ordering and review of radiographic studies, pulse oximetry and re-evaluation of patient's condition.  Kipp Brood, MD [facility] ICU Physician [place]  Pager: [phone] Mobile: [phone] After hours: [phone].

## 2018-08-22 NOTE — Progress Notes (Signed)
STROKE TEAM PROGRESS NOTE   SUBJECTIVE (INTERVAL HISTORY) Her  RN   is at bedside.  Pt neuro stable    She is awake, alert, following commands.  Plan for early trach and PEG on Monday 08/18/2018.  Patient  Neuro exam is unchanged.  Glucose is better after  Lantus dose increase.  BP  Is good OBJECTIVE Vitals:   08/22/18 1000 08/22/18 1100 08/22/18 1125 08/22/18 1239  BP: 116/62 120/65 120/65   Pulse: 62 71 66   Resp: (!) 26 (!) 25 (!) 25   Temp:    99.2 F (37.3 C)  TempSrc:    Axillary  SpO2: 97% 96% 96%   Weight:      Height:        CBC:  Recent Labs  Lab 08/21/18 0306 08/22/18 0317  WBC 11.3* 10.3  NEUTROABS  --  6.9  HGB 8.7* 8.7*  HCT 30.7* 30.7*  MCV 80.8 83.0  PLT 491* 462*    Basic Metabolic Panel:  Recent Labs  Lab 08/16/18 1423 08/17/18 0502  08/19/18 0718 08/22/18 0317  NA 136 140   < > 146* 155*  K 3.9 3.8   < > 3.4* 3.8  CL 113* 113*   < > 116* 123*  CO2 18* 18*   < > 20* 22  GLUCOSE 209* 196*   < > 276* 137*  BUN 20 26*   < > 36* 52*  CREATININE 1.16* 1.03*   < > 1.07* 1.41*  CALCIUM 9.5 10.1   < > 11.5* 12.5*  MG 1.6* 1.7  --  2.1  --   PHOS 3.3 3.3  --   --   --    < > = values in this interval not displayed.    Lipid Panel:     Component Value Date/Time   CHOL 256 (H) 08/15/2018 0512   TRIG 248 (H) 08/15/2018 0512   HDL 35 (L) 08/15/2018 0512   CHOLHDL 7.3 08/15/2018 0512   VLDL 50 (H) 08/15/2018 0512   LDLCALC 171 (H) 08/15/2018 0512   HgbA1c:  Lab Results  Component Value Date   HGBA1C 11.4 (H) 08/15/2018   Urine Drug Screen:     Component Value Date/Time   LABOPIA NONE DETECTED 08/17/2018 1836   COCAINSCRNUR NONE DETECTED 08/29/2018 1836   LABBENZ NONE DETECTED 08/15/2018 1836   AMPHETMU NONE DETECTED 08/12/2018 1836   THCU NONE DETECTED 09/04/2018 1836   LABBARB NONE DETECTED 09/03/2018 1836    Alcohol Level No results found for: ETH  IMAGING  Ct Angio Head W Or Wo Contrast Ct Angio Neck W Or Wo  Contrast 08/15/2018 IMPRESSION:  1. Interval basilar to left proximal PCA stenting. The density of the stent walls precludes detection of in stent stenosis; there is a degree of wasting at the mid basilar segment. There is flow in the left PCA beyond the stent such that there is presumed stent patency.  2. Known lower pontine infarct. There are new small cerebellar infarcts since brain MRI yesterday.  3. Severe left P2 segment stenosis, also seen previously.  4. Moderate atheromatous narrowing in the proximal left MCA.    Ct Angio Head W Or Wo Contrast Ct Angio Neck W Or Wo Contrast 08/26/2018 IMPRESSION:  1. 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 No definite acute thrombus identified.  2. Severe stenosis left posterior cerebral artery and moderate stenosis right posterior cerebral artery.  3. Mild stenosis  left MCA and moderate stenosis left MCA bifurcation.  4. No significant carotid or vertebral artery stenosis in the neck.    Ct Head Wo Contrast 08/26/2018 IMPRESSION:  No acute intracranial abnormalities. Mild white matter changes likely due to small vessel ischemia.    Mr Brain Wo Contrast 08/11/2018 IMPRESSION:  1. Acute nonhemorrhagic infarct involving the left paramedian brainstem. The infarct crosses midline.  2. Other periventricular and subcortical white matter disease is moderately advanced for age. This likely reflects the sequela of chronic microvascular ischemia.  3. Tapering of the dens with prominent soft tissue pannus. 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    Ct Head Code Stroke Wo Contrast 08/23/2018  IMPRESSION:  1. No acute abnormality and no change from earlier today  2. ASPECTS is 10 3.    Portable Chest X-ray 08/19/2018 IMPRESSION:  Interval placement of endotracheal tube. Question developing perihilar edema or infectious process. Gaseous distension of the  stomach.   Cerebral Angiogram 08/26/2018 S/P 4 vessel cerebral arteriogram RT CFA approach. 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 S/P stent assisted angioplasty of distal basilar artery with patency of 80 to 90 %. 2.Approx 70 % stenosis of LT ICA supraclinoid seg   MRI 08/15/18 1. Progressive acute pontine infarct. New patchy bilateral cerebellar infarction. New tiny left thalamic infarcts. 2. Preserved flow void in the stented basilar. 3. left facial/submandibular edema, please correlate with neck exam. Edited result: IMPRESSION: 1. Progressive acute pontine infarct. New patchy bilateral cerebellar infarction. New tiny left thalamic infarcts. 2. Preserved flow void in the stented basilar.  TTE - Left ventricle: The cavity size was normal. There was severe   concentric hypertrophy. Systolic function was normal. The   estimated ejection fraction was in the range of 60% to 65%. Wall   motion was normal; there were no regional wall motion   abnormalities. Doppler parameters are consistent with abnormal   left ventricular relaxation (grade 1 diastolic dysfunction). The   E/e&' ratio is <8, suggesting normal LV filling pressure. - Aortic valve: Trileaflet; mildly calcified leaflets.   Transvalvular velocity was minimally increased. There was no   regurgitation. Mean gradient (S): 12 mm Hg. - Mitral valve: Mildly thickened leaflets . There was trivial   regurgitation. - Left atrium: The atrium was normal in size. - Inferior vena cava: The vessel was dilated. The respirophasic   diameter changes were blunted (< 50%), consistent with elevated   central venous pressure. Impressions: - LVEF 60-65%, severe LVH, normal wall motion, grade 1 DD, normal   LV filling pressure, minimally increased aortic velocity without   signficant stenosis, trivial MR, normal LA size, dilated  IVC.   PHYSICAL EXAM  Temp:  [99.2 F (37.3 C)-102 F (38.9 C)] 99.2 F (37.3 C) (10/18 1239) Pulse Rate:  [62-87] 66 (10/18 1125) Resp:  [19-28] 25 (10/18 1125) BP: (114-157)/(62-87) 120/65 (10/18 1125) SpO2:  [93 %-100 %] 96 % (10/18 1125) FiO2 (%):  [30 %] 30 % (10/18 1339) Weight:  [121.8 kg] 121.8 kg (10/18 0400)  General -  Obese middle-aged African-American lady, still intubated    Ophthalmologic - fundi not visualized due to noncooperation.  Cardiovascular - Regular rate and rhythm.  Neuro - intubated. Not sedated.awake, alert, eyes open, still intubated, not on sedation. No ptosis. Right gaze incomplete with rotational nystagmus. PERRL, EOMI. Corneal, gag and cough present. Facial symmetry and tongue not able  to test due to ET tube.. Left arm moves slightly to command as does the LLE  able to wiggle toes and move back and forth, left positive babinski, right negative babinski. DTR 1+. Sensation not cooperative, coordination and gait not tested.   ASSESSMENT/PLAN Andrea [name] is a 57 y.o. female with history of difficult to control hypertension, insulin-dependent diabetes, obesity, hypothyroidism status post ablation presenting with chest discomfort, right-sided weakness, slurred speech and right facial droop. She did not receive IV t-PA due to late presentation. S/P stent assisted angioplasty of distal basilar artery.  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 Worsening symptoms with extension of pontine/medullary infarcts with new small bilateral cerebellar infarcts without evidence of stent re-stenosis or occlusion  Resultant b// UE and LE weakness, eyes minimally disconjugate but improving, nystagmus  CT head - No acute intracranial abnormalities.  CTA H&N 08/25/2018 - Extensive atherosclerotic disease in the basilar with focal critical stenosis in the mid to distal basilar. No definite acute thrombus identified.  Severe stenosis left posterior cerebral artery  MRI head - Acute nonhemorrhagic infarct involving the left paramedian brainstem.   CTA H&N 08/15/2018 - stent too dense to see BA lumen, but distal flow preserved. New small cerebellar infarcts since brain MRI yesterday.  Severe left P2 segment stenosis, also seen previously.   Repeat MRI - extension of b/l pontine and upper medullary infarcts with new b/l cerebellar infarcts.  2D Echo - EF 60-65%  LDL - 174  HgbA1c - 11.1  P2Y12 = 35  VTE prophylaxis - heparin subq  Diet - NPO  aspirin 81 mg daily prior to admission, was on Brilinta 90 mg daily and ASA 81 mg daily.  Brilinta currently on hold since 08/19/18 for elective PEG tube and tracheostomy this Friday. Currently on iv heparin till procedures  Patient counseled to be compliant with her antithrombotic medications  Ongoing aggressive stroke risk factor management  Therapy recommendations:  pending  Disposition:  Pending  Intracranial stenosis  BA mid to distal severe stenosis s/p BA stenting  Left PCA severe stenosis, R PCA moderate stenosis  Left MCA moderate stenosis  Uncontrolled stroke risk factors with HLD, HTN, DM  Non compliance with meds at home  Respiratory failure  Intubated  CCM on board  Tolerated weaning female  Plan for trach Friday  Hypertension  Stable, on the high end  BP goal 130-160   On po norvasc, losartan  Metoprolol changed to Coreg 25 bid  Avoid low BP  Hyperlipidemia  Lipid lowering medication PTA:  Lipitor 20 mg daily  LDL 174, goal < 70  Increase to 80 mg daily  Continue statin at discharge  Diabetes  HgbA1c 11.4, goal < 7.0  Uncontrolled  Hyperglycemia   Lantus dose increased  Continue basal NovoLog  SSI  CBG monitoring  DM coordinator on board  Dysphagia  On tube feeding 55 cc/h  IV fluids 30 cc/h  Plan for PEG tube Friday  Other Stroke Risk Factors  Obesity, Body mass index is 46.09  kg/m., recommend weight loss, diet and exercise as appropriate   Screen for sleep apnea when appropriate and may benefit from referral to [facility]  Other Active Problems  Anemia due to acute blood loss - 11.1->9.7->9.4->9.3->9.4  Elevated creatinine - 1.04->1.13->1.03->1.07  Hypokalemia - 3.1 -> supplement->3.7->3.8->3.4  Leukocytosis - 9.4->11.1->9.6->11.3->12.7  Hospital day # 8 Discussed with Dr. Kipp Brood , MD critical carel care medicine. Plan for tracheostomy  and PEG tube on 08/07/2018. No family available at the bedside for discussion. This patient is critically ill due to worsening brainstem infarct, BA stenosis s/p stenting, hyperglycemia, hypertensive and at significant risk of neurological worsening, death form recurrent stroke, hemorrhagic conversion, CHF, seizure, aspiration. This patient's care requires constant monitoring of vital signs, hemodynamics, respiratory and cardiac monitoring, review of multiple databases, neurological assessment, discussion with family, other specialists and medical decision making of high complexity. I spent 35 minutes of neurocritical care time in the care of this patient.  Antony Contras, MD Stroke Neurology 08/22/2018 2:31 PM    To contact Stroke Continuity provider, please refer to [email or web address] After hours, contact General Neurology

## 2018-08-22 NOTE — Progress Notes (Signed)
ANTICOAGULATION CONSULT NOTE  Pharmacy Consult:  Heparin Indication: Intracerebral stent  Allergies  Allergen Reactions  . Tramadol     "Makes me sees things and I just dont act right"    Patient Measurements: Height: 5\' 4"  (162.6 cm) Weight: 268 lb 8.3 oz (121.8 kg) IBW/kg (Calculated) : 54.7 Heparin Dosing Weight: 85 kg  Vital Signs: Temp: 99.9 F (37.7 C) (10/18 0841) Temp Source: Axillary (10/18 0841) BP: 123/71 (10/18 0900) Pulse Rate: 69 (10/18 0900)  Labs: Recent Labs    08/20/18 0358 08/21/18 0306 08/21/18 1224 08/22/18 0317  HGB 9.4* 8.7*  --  8.7*  HCT 32.8* 30.7*  --  30.7*  PLT 489* 491*  --  462*  HEPARINUNFRC 0.11* <0.10* 0.15* 0.21*  CREATININE  --   --   --  1.41*    Estimated Creatinine Clearance: 56.6 mL/min (A) (by C-G formula based on SCr of 1.41 mg/dL (H)).    Assessment: 54 YOF with recent intracerebral stent to continue IV heparin as bridge while Brilinta is on hold.  Heparin level is therapeutic; no bleeding reported.  Plan for trach/PEG on Monday.   Goal of Therapy:  Heparin level 0.1 - 0.25 units/ml Monitor platelets by anticoagulation protocol: Yes    Plan:  Continue heparin gtt at 850 units/hr Daily heparin level and CBC   Andrea [name], PharmD, BCPS, [place] 08/22/2018, 10:36 AM

## 2018-08-22 NOTE — Progress Notes (Signed)
Pt has had increasingly red-tinged urine, and during peri care, clots were noted from peri area.  Pt has also seemed to bite her tongue, and there is new frank blood in the ETT tube as well as sub-glottic tubing.  Dr. Lynetta Mare from [place] notified, advised to hold IV heparin for 6 hours, starting now at 1730.  Will pass along to pm RN to resume at 2330 tonight.

## 2018-08-22 NOTE — Progress Notes (Signed)
Inpatient Diabetes Program Recommendations  AACE/ADA: New Consensus Statement on Inpatient Glycemic Control (2015)  Target Ranges:  Prepandial:   less than 140 mg/dL      Peak postprandial:   less than 180 mg/dL (1-2 hours)      Critically ill patients:  140 - 180 mg/dL   Lab Results  Component Value Date   GLUCAP 100 (H) 08/22/2018   HGBA1C 11.4 (H) 08/15/2018    Review of Glycemic Control Results for Andrea [name], Andrea [name] (MRN [id number]) as of 08/22/2018 09:49  Ref. Range 08/21/2018 23:07 08/22/2018 03:16 08/22/2018 07:24  Glucose-Capillary Latest Ref Range: 70 - 99 mg/dL 149 (H) 129 (H) 100 (H)   Diabetes history: Type 2 DM Outpatient Diabetes medications: Lantus 27 units QHS, Metformin 1000 mg BID Current orders for Inpatient glycemic control: Lantus 25 units BID, Lantus 15 units BID (to start on 10/20-10/22), Novolog 0-20 units Q4H, Novolog 4 units Q4H  Inpatient Diabetes Program Recommendations:    Noted placement of additional Lantus 15 units BID, to start prior to date of surgery for PEG and trach. However, Lantus 25 units BID still ordered for those dates, creating duplicates and room for error.  Patient's blood sugars have trended well with Lantus 25 units BID. Therefore, would discontinue Lantus 15 units BID? On day of surgery, would recommend holding the tube feed coverage and restarting once PEG is in use.  MD paged.   Thanks, Bronson Curb, MSN, RNC-OB Diabetes Coordinator [phone] (8a-5p)

## 2018-08-22 NOTE — Progress Notes (Signed)
Physical Therapy Treatment Patient Details Name: Andrea [name] MRN: [id number] DOB: [birth year] Today's Date: 08/22/2018    History of Present Illness 57 yo presented to ED with chest pain noted Rt hemiparesis in ED with acute Left paramedian brainstem infarct. Intubated 10/10 for angioplasty, extubated post procedure and reintubated. Repeat MRI with brainstem infarct extension and bil cerebellar infarcts. PMHx: HTN, DM, CKD    PT Comments    Pt was able to sit EOB today, but seemed more fatigued/lethargic than our last session.  She had a copious amount of secreation come up when we sat up, making her cough on the vent. RN came in and deep suctioned her during sitting.  VSS and vent on full support FiO2 30%, PEEP 5.  Pt continues to move more on her left side with no signs of movement today on her right.  Her husband and sister in law were in room observing session.    Follow Up Recommendations  Supervision/Assistance - 24 hour;LTACH     Equipment Recommendations  Hospital bed;Wheelchair (measurements PT);Other (comment)(hoyer lift)    Recommendations for Other Services   NA     Precautions / Restrictions Precautions Precautions: Fall Precaution Comments: vent, panda    Mobility  Bed Mobility Overal bed mobility: Needs Assistance Bed Mobility: Supine to Sit;Sit to Supine     Supine to sit: +2 for physical assistance;Total assist;HOB elevated Sit to supine: +2 for physical assistance;Total assist;HOB elevated   General bed mobility comments: Two person total assist with HOB elevated, assist to support trunk and move legs to EOB.  Pt not initiating any movement to help, but also not resistive or pushing.       Modified Rankin (Stroke Patients Only) Modified Rankin (Stroke Patients Only) Pre-Morbid Rankin Score: No symptoms Modified Rankin: Severe disability     Balance Overall balance assessment: Needs assistance Sitting-balance support: Bilateral upper extremity  supported Sitting balance-Leahy Scale: Zero Sitting balance - Comments: max assist EOB with support needed at trunk and to help extend cervical spine as well.  Pt doing less today to help hold her head up (she assisted  last session and today she needed total assist to keep her head upright).  Worked EOB x 10 mins on following basic commands, upright posture, RN called in to preform deep suctioning to her ETT as she has copious amounts of secreations coming out.                                     Cognition Arousal/Alertness: Lethargic Behavior During Therapy: Flat affect Overall Cognitive Status: Impaired/Different from baseline Area of Impairment: Attention;Memory;Following commands;Safety/judgement;Problem solving                   Current Attention Level: Sustained   Following Commands: Follows one step commands inconsistently;Follows one step commands with increased time Safety/Judgement: Decreased awareness of safety;Decreased awareness of deficits   Problem Solving: Slow processing;Decreased initiation;Difficulty sequencing;Requires verbal cues;Requires tactile cues General Comments: I tried to use blink once for yes and blink twice for no with pt, but she gave inconsistant blinks to questions that were wrong.  She did follow a few commands most consistantly on her left lower extremity.              Pertinent Vitals/Pain Pain Assessment: Faces Faces Pain Scale: No hurt           PT Goals (current goals can  now be found in the care plan section) Acute Rehab PT Goals Patient Stated Goal: unable to state as she has ETT Progress towards PT goals: Progressing toward goals    Frequency    Min 2X/week(increase to more times per week as pt improves)      PT Plan Current plan remains appropriate       AM-PAC PT "6 Clicks" Daily Activity  Outcome Measure  Difficulty turning over in bed (including adjusting bedclothes, sheets and blankets)?:  Unable Difficulty moving from lying on back to sitting on the side of the bed? : Unable Difficulty sitting down on and standing up from a chair with arms (e.g., wheelchair, bedside commode, etc,.)?: Unable Help needed moving to and from a bed to chair (including a wheelchair)?: Total Help needed walking in hospital room?: Total Help needed climbing 3-5 steps with a railing? : Total 6 Click Score: 6    End of Session Equipment Utilized During Treatment: Oxygen;Other (comment)(vent) Activity Tolerance: Patient limited by fatigue;Patient limited by lethargy Patient left: in bed;with call bell/phone within reach;with family/visitor present   PT Visit Diagnosis: Other abnormalities of gait and mobility (R26.89);Muscle weakness (generalized) (M62.81);Other symptoms and signs involving the nervous system (R29.898);Hemiplegia and hemiparesis Hemiplegia - Right/Left: Right Hemiplegia - dominant/non-dominant: Dominant Hemiplegia - caused by: Cerebral infarction     Time: 3846-6599 PT Time Calculation (min) (ACUTE ONLY): 37 min  Charges:  $Therapeutic Activity: 23-37 mins                    Bridgid Printz B. Bruce Churilla, PT, DPT  Acute Rehabilitation [phone] pager #[phone] office   08/22/2018, 5:29 PM

## 2018-08-23 ENCOUNTER — Inpatient Hospital Stay (HOSPITAL_COMMUNITY): Payer: Medicaid Other

## 2018-08-23 DIAGNOSIS — J9600 Acute respiratory failure, unspecified whether with hypoxia or hypercapnia: Secondary | ICD-10-CM

## 2018-08-23 DIAGNOSIS — D72829 Elevated white blood cell count, unspecified: Secondary | ICD-10-CM

## 2018-08-23 DIAGNOSIS — E870 Hyperosmolality and hypernatremia: Secondary | ICD-10-CM

## 2018-08-23 LAB — URINALYSIS, COMPLETE (UACMP) WITH MICROSCOPIC
Bilirubin Urine: NEGATIVE
Glucose, UA: NEGATIVE mg/dL
Ketones, ur: NEGATIVE mg/dL
Leukocytes, UA: NEGATIVE
NITRITE: NEGATIVE
PH: 5 (ref 5.0–8.0)
Protein, ur: 30 mg/dL — AB
Specific Gravity, Urine: 1.018 (ref 1.005–1.030)

## 2018-08-23 LAB — BASIC METABOLIC PANEL
Anion gap: 7 (ref 5–15)
BUN: 52 mg/dL — ABNORMAL HIGH (ref 6–20)
CALCIUM: 12.4 mg/dL — AB (ref 8.9–10.3)
CO2: 27 mmol/L (ref 22–32)
Chloride: 122 mmol/L — ABNORMAL HIGH (ref 98–111)
Creatinine, Ser: 1.4 mg/dL — ABNORMAL HIGH (ref 0.44–1.00)
GFR, EST AFRICAN AMERICAN: 47 mL/min — AB (ref >60)
GFR, EST NON AFRICAN AMERICAN: 41 mL/min — AB (ref >60)
Glucose, Bld: 146 mg/dL — ABNORMAL HIGH (ref 70–99)
Potassium: 3.6 mmol/L (ref 3.5–5.1)
Sodium: 156 mmol/L — ABNORMAL HIGH (ref 135–145)

## 2018-08-23 LAB — GLUCOSE, CAPILLARY
GLUCOSE-CAPILLARY: 178 mg/dL — AB (ref 70–99)
Glucose-Capillary: 153 mg/dL — ABNORMAL HIGH (ref 70–99)
Glucose-Capillary: 116 mg/dL — ABNORMAL HIGH (ref 70–99)
Glucose-Capillary: 161 mg/dL — ABNORMAL HIGH (ref 70–99)
Glucose-Capillary: 215 mg/dL — ABNORMAL HIGH (ref 70–99)

## 2018-08-23 LAB — CBC
HEMATOCRIT: 31.5 % — AB (ref 36.0–46.0)
Hemoglobin: 8.7 g/dL — ABNORMAL LOW (ref 12.0–15.0)
MCH: 22.8 pg — ABNORMAL LOW (ref 26.0–34.0)
MCHC: 27.6 g/dL — ABNORMAL LOW (ref 30.0–36.0)
MCV: 82.5 fL (ref 80.0–100.0)
NRBC: 0.3 % — AB (ref 0.0–0.2)
PLATELETS: 541 10*3/uL — AB (ref 150–400)
RBC: 3.82 MIL/uL — AB (ref 3.87–5.11)
RDW: 16.1 % — AB (ref 11.5–15.5)
WBC: 12.9 10*3/uL — ABNORMAL HIGH (ref 4.0–10.5)

## 2018-08-23 LAB — HEPARIN LEVEL (UNFRACTIONATED): Heparin Unfractionated: 0.18 IU/mL — ABNORMAL LOW (ref 0.30–0.70)

## 2018-08-23 MED ORDER — FREE WATER
300.0000 mL | Status: DC
  Administered 2018-08-23 – 2018-08-28 (×27): 300 mL

## 2018-08-23 MED ORDER — DEXTROSE 5 % IV SOLN
INTRAVENOUS | Status: DC
  Administered 2018-08-23 – 2018-08-28 (×5): via INTRAVENOUS

## 2018-08-23 MED ORDER — SODIUM CHLORIDE 0.45 % IV SOLN
INTRAVENOUS | Status: DC
  Administered 2018-08-23: 12:00:00 via INTRAVENOUS

## 2018-08-23 NOTE — Progress Notes (Signed)
NAME:  Andrea [name], MRN:  [id number], DOB:  [birth year], LOS: 9 ADMISSION DATE:  08/27/2018, CONSULTATION DATE:  08/30/2018 REFERRING MD:  Dr. Rory Percy, CHIEF COMPLAINT:  CP/ Acute CVA  Brief History   25 yoF w/poorly controlled HTN and IDDM presenting with CP and left arm pain w/BP 220/117. Initial CTH neg.  Cardiac workup negative for acute event thus far, cardiology following.  Code stroke initiated for R hemiplegia, dysarthria and right facial droop.  Out of window for TPA.  Requiring cleviprex for BP control.  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  Evolving pontine stroke on MRI. Intubated for neuro IR s/p stenting and angioplasty with 80-90% patency.  Extubated post procedure but due to insufficency placed on BiPAP and brought to ICU.    Past Medical History  HTN, IDDM, hypothyroidism s/p ablation  Significant Hospital Events   10/9 present to [facility] ED -transferred [facility] 10/9 basilar artery stenting 10/9 intubated urgently 6h post procedure for inability to protect airway. 10/10 CTA for declining exam - evolving pontine infarct, stent deemed to be patent 10/14 Brillinta stopped and ASA/ heparin started to in anticipation of tracheostomy on Monday. 10/14 through 10/19: Neuro exam has been stable sodium increasing in spite of free water via tube, providing supportive care awaiting trach  Consults: date of consult/date signed off & final recs:  Cards 10/10 Neurology 10/10 PCCM 10/10  Procedures (surgical and bedside):  10/10 Neuro IR >> s/p stent and angioplasty of distal basilar artery  10/10 ETT for procedure 10/10 ETT (reintubated) >> 70mm  Significant Diagnostic Tests:  08/28/2018  CTH >>  No acute intracranial abnormalities. Mild white matter changes likely due to small vessel ischemia  08/1018 CTH >> 1. No acute abnormality and no change from earlier today 2. ASPECTS is 10  08/13/2018  CTA head and neck >>  1. Extensive atherosclerotic disease in  the basilar with focal critical stenosis in the mid to distal basilar. No definite acute thrombus identified. 2. Severe stenosis left posterior cerebral artery and moderate stenosis right posterior cerebral artery. 3. Mild stenosis left MCA and moderate stenosis left MCA bifurcation. 4. No significant carotid or vertebral artery stenosis in the neck.  08/17/2018 MRI - evolving pontine infarct.  08/15/18  Echocardiogram - normal LVSF with severe LVH.   Micro Data:  08/10/2018 MRSA PCR >>negative  Antimicrobials:  10/10 cefazolin preop  Subjective:  Appears comfortable  Objective   Blood pressure (Abnormal) 164/84, pulse 69, temperature 98.2 F (36.8 C), temperature source Axillary, resp. rate (Abnormal) 26, height 5\' 4"  (1.626 m), weight 121.2 kg, last menstrual period 07/26/2018, SpO2 100 %.    Vent Mode: PSV;CPAP FiO2 (%):  [30 %] 30 % Set Rate:  [15 bmp] 15 bmp Vt Set:  [430 mL] 430 mL PEEP:  [4 cmH20-5 cmH20] 4 cmH20 Pressure Support:  [10 cmH20] 10 cmH20 Plateau Pressure:  [14 cmH20-17 cmH20] 14 cmH20   Intake/Output Summary (Last 24 hours) at 08/23/2018 1132 Last data filed at 08/23/2018 1000 Gross per 24 hour  Intake 1456.49 ml  Output 1100 ml  Net 356.49 ml   Filed Weights   08/20/18 0500 08/22/18 0400 08/23/18 0300  Weight: 125.3 kg 121.8 kg 121.2 kg    Examination: General: This is an obese 57 year old African-American female she is currently resting on the ventilator and in no acute distress HEENT normocephalic atraumatic mucous membranes are moist she is orally intubated Pulmonary: Coarse scattered rhonchi no accessory use Cardiac:  Regular rate and rhythm without murmur rub or gallop Abdomen: Soft nontender has just had a bowel movement positive bowel sounds GU: Clear yellow Neuro: Awake, follows commands. 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   Swallowing dysfunction is not expected to improve sufficiently to permit extubation so tracheostomy and PEG tube are planned for Monday 10/21 Plan Brilinta changed to heparin with aspirin given plan for trach and PEG placement High risk for stent occlusion so we will have to watch closely while bridging anticoagulation Continue supportive care  Respiratory Insufficiency due to inability to protect airway from dysarthria.  Expected with location of stroke.  Portable chest x-ray personally reviewed:Clear chest x-ray, decreased/low volume film endotracheal tube in satisfactory position Plan Continuing full ventilator support PAD protocol RASS goal -1 Plan for tracheostomy on 21st  Hypertensive Crisis Improved blood pressure control. Have reintroduced oral anti-hypertensives.  Plan  Long-term BP goal less than 130, acutely treat with PRN's for greater than 180 No change in Norvasc for now  Type 2 diabetes Plan Sliding scale insulin  Hypernatremia, with increased creatinine. Consistent with volume contraction.  Creatinine about the same.  Current free water deficit calculated at: 6.9 L Plan We will continue free water 300 mL's every 4 hours Discontinue half saline, add D5 water at 50 cc an hour Continue strict intake and output Follow-up a.m. chemistry   Disposition / Summary of Today's Plan 08/23/18     Continue current supportive care Plan for tracheostomy and PEG tube placement on 08/11/2018  Best Practice  Diet: TF Pain/Anxiety/Delirium protocol (if indicated): N/a VAP protocol (if indicated): no DVT prophylaxis: SCDs  GI prophylaxis: Famotidine Hyperglycemia protocol: SSI, lantus Mobility: bedrest Code Status: Full  Family Communication: Updated the patient's sister and niece at bedside on 10/14  Labs ( personally  Reviewed)   CBC: Recent Labs  Lab 08/19/18 0718 08/20/18 0358 08/21/18 0306 08/22/18 0317 08/23/18 0531  WBC 11.0* 12.7* 11.3* 10.3 12.9*  NEUTROABS  --   --   --  6.9  --   HGB 9.6* 9.4* 8.7* 8.7* 8.7*  HCT 33.8* 32.8* 30.7* 30.7* 31.5*  MCV 80.3 79.6* 80.8 83.0 82.5  PLT 419* 489* 491* 462* 541*    Basic Metabolic Panel: Recent Labs  Lab 08/16/18 1423 08/17/18 0502 08/17/18 1243 08/18/18 0626 08/19/18 0718 08/22/18 0317 08/23/18 0531  NA 136 140 139 143 146* 155* 156*  K 3.9 3.8 4.0 3.3* 3.4* 3.8 3.6  CL 113* 113* 116* 116* 116* 123* 122*  CO2 18* 18* 15* 21* 20* 22 27  GLUCOSE 209* 196* 215* 206* 276* 137* 146*  BUN 20 26* 27* 28* 36* 52* 52*  CREATININE 1.16* 1.03* 1.01* 0.95 1.07* 1.41* 1.40*  CALCIUM 9.5 10.1 10.1 10.4* 11.5* 12.5* 12.4*  MG 1.6* 1.7  --   --  2.1  --   --   PHOS 3.3 3.3  --   --   --   --   --    GFR: Estimated Creatinine Clearance: 56.9 mL/min (A) (by C-G formula based on SCr of 1.4 mg/dL (H)). Recent Labs  Lab 08/20/18 0358 08/21/18 0306 08/22/18 0317 08/23/18 0531  WBC 12.7* 11.3* 10.3 12.9*    Liver Function Tests: No results for input(s): AST, ALT, ALKPHOS, BILITOT,  PROT, ALBUMIN in the last 168 hours. No results for input(s): LIPASE, AMYLASE in the last 168 hours. No results for input(s): AMMONIA in the last 168 hours.  ABG    Component Value Date/Time   PHART 7.334 (L) 08/13/2018 2259   PCO2ART 41.9 08/11/2018 2259   PO2ART 275.0 (H) 08/17/2018 2259   HCO3 22.3 08/10/2018 2259   TCO2 24 08/07/2018 2259   ACIDBASEDEF 3.0 (H) 08/19/2018 2259   O2SAT 100.0 08/08/2018 2259     Coagulation Profile: Recent Labs  Lab 08/18/18 0626  INR 1.12    Cardiac Enzymes: No results for input(s): CKTOTAL, CKMB, CKMBINDEX, TROPONINI in the last 168 hours.  HbA1C: Hgb A1c MFr Bld  Date/Time Value Ref Range Status  08/15/2018 05:12 AM 11.4 (H) 4.8 - 5.6 % Final    Comment:    (NOTE) Pre diabetes:          5.7%-6.4% Diabetes:               >6.4% Glycemic control for   <7.0% adults with diabetes   01/12/2017 02:43 AM 12.8 (H) 4.8 - 5.6 % Final    Comment:    (NOTE)         Pre-diabetes: 5.7 - 6.4         Diabetes: >6.4         Glycemic control for adults with diabetes: <7.0     CBG: Recent Labs  Lab 08/22/18 2004 08/22/18 2327 08/23/18 0259 08/23/18 0730 08/23/18 1115  GLUCAP 176* 183* 153* 116* [place] ACNP-BC [place] Pager # [phone] OR # [phone] if no answer

## 2018-08-23 NOTE — Progress Notes (Signed)
STROKE TEAM PROGRESS NOTE   SUBJECTIVE (INTERVAL HISTORY) Her  RN   is at bedside.  Pt neuro stable.  She is awake, alert, following commands.  Plan for early trach and PEG on Monday 08/11/2018.  Patient  Neuro exam is unchanged.  Glucose is better after  Lantus dose increase.  BP still on the high end. Na 156 and will need more free water. Had low grade fever overnight, will check CXR and UA. Currently afebrile.   OBJECTIVE Vitals:   08/23/18 0400 08/23/18 0500 08/23/18 0600 08/23/18 0700  BP: (!) 154/88 (!) 145/88 (!) 150/83 (!) 159/85  Pulse: 67 67 65 63  Resp: (!) 25 (!) 22 20 20   Temp: 99.6 F (37.6 C)     TempSrc: Axillary     SpO2: 99% 99% 100% 99%  Weight:      Height:        CBC:  Recent Labs  Lab 08/22/18 0317 08/23/18 0531  WBC 10.3 12.9*  NEUTROABS 6.9  --   HGB 8.7* 8.7*  HCT 30.7* 31.5*  MCV 83.0 82.5  PLT 462* 541*    Basic Metabolic Panel:  Recent Labs  Lab 08/16/18 1423 08/17/18 0502  08/19/18 0718 08/22/18 0317 08/23/18 0531  NA 136 140   < > 146* 155* 156*  K 3.9 3.8   < > 3.4* 3.8 3.6  CL 113* 113*   < > 116* 123* 122*  CO2 18* 18*   < > 20* 22 27  GLUCOSE 209* 196*   < > 276* 137* 146*  BUN 20 26*   < > 36* 52* 52*  CREATININE 1.16* 1.03*   < > 1.07* 1.41* 1.40*  CALCIUM 9.5 10.1   < > 11.5* 12.5* 12.4*  MG 1.6* 1.7  --  2.1  --   --   PHOS 3.3 3.3  --   --   --   --    < > = values in this interval not displayed.    Lipid Panel:     Component Value Date/Time   CHOL 256 (H) 08/15/2018 0512   TRIG 248 (H) 08/15/2018 0512   HDL 35 (L) 08/15/2018 0512   CHOLHDL 7.3 08/15/2018 0512   VLDL 50 (H) 08/15/2018 0512   LDLCALC 171 (H) 08/15/2018 0512   HgbA1c:  Lab Results  Component Value Date   HGBA1C 11.4 (H) 08/15/2018   Urine Drug Screen:     Component Value Date/Time   LABOPIA NONE DETECTED 08/17/2018 1836   COCAINSCRNUR NONE DETECTED 08/08/2018 1836   LABBENZ NONE DETECTED 08/10/2018 1836   AMPHETMU NONE DETECTED 08/17/2018  1836   THCU NONE DETECTED 08/22/2018 1836   LABBARB NONE DETECTED 08/29/2018 1836    Alcohol Level No results found for: ETH  IMAGING  Ct Angio Head W Or Wo Contrast Ct Angio Neck W Or Wo Contrast 08/15/2018 IMPRESSION:  1. Interval basilar to left proximal PCA stenting. The density of the stent walls precludes detection of in stent stenosis; there is a degree of wasting at the mid basilar segment. There is flow in the left PCA beyond the stent such that there is presumed stent patency.  2. Known lower pontine infarct. There are new small cerebellar infarcts since brain MRI yesterday.  3. Severe left P2 segment stenosis, also seen previously.  4. Moderate atheromatous narrowing in the proximal left MCA.    Ct Angio Head W Or Wo Contrast Ct Angio Neck W Or Wo Contrast 08/05/2018 IMPRESSION:  1. 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 No definite acute thrombus identified.  2. Severe stenosis left posterior cerebral artery and moderate stenosis right posterior cerebral artery.  3. Mild stenosis left MCA and moderate stenosis left MCA bifurcation.  4. No significant carotid or vertebral artery stenosis in the neck.    Ct Head Wo Contrast 09/01/2018 IMPRESSION:  No acute intracranial abnormalities. Mild white matter changes likely due to small vessel ischemia.    Mr Brain Wo Contrast 08/07/2018 IMPRESSION:  1. Acute nonhemorrhagic infarct involving the left paramedian brainstem. The infarct crosses midline.  2. Other periventricular and subcortical white matter disease is moderately advanced for age. This likely reflects the sequela of chronic microvascular ischemia.  3. Tapering of the dens with prominent soft tissue pannus. 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    Ct Head Code Stroke Wo Contrast 08/21/2018  IMPRESSION:  1. No acute abnormality and no change from earlier today  2. ASPECTS is 10 3.     Cerebral Angiogram 08/06/2018 S/P 4 vessel cerebral arteriogram RT CFA approach. 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 S/P stent assisted angioplasty of distal basilar artery with patency of 80 to 90 %. 2.Approx 70 % stenosis of LT ICA supraclinoid seg   MRI 08/15/18 1. Progressive acute pontine infarct. New patchy bilateral cerebellar infarction. New tiny left thalamic infarcts. 2. Preserved flow void in the stented basilar. 3. left facial/submandibular edema, please correlate with neck exam. Edited result: IMPRESSION: 1. Progressive acute pontine infarct. New patchy bilateral cerebellar infarction. New tiny left thalamic infarcts. 2. Preserved flow void in the stented basilar.  TTE - Left ventricle: The cavity size was normal. There was severe   concentric hypertrophy. Systolic function was normal. The   estimated ejection fraction was in the range of 60% to 65%. Wall   motion was normal; there were no regional wall motion   abnormalities. Doppler parameters are consistent with abnormal   left ventricular relaxation (grade 1 diastolic dysfunction). The   E/e&' ratio is <8, suggesting normal LV filling pressure. - Aortic valve: Trileaflet; mildly calcified leaflets.   Transvalvular velocity was minimally increased. There was no   regurgitation. Mean gradient (S): 12 mm Hg. - Mitral valve: Mildly thickened leaflets . There was trivial   regurgitation. - Left atrium: The atrium was normal in size. - Inferior vena cava: The vessel was dilated. The respirophasic   diameter changes were blunted (< 50%), consistent with elevated   central venous pressure. Impressions: - LVEF 60-65%, severe LVH, normal wall motion, grade 1 DD, normal   LV filling pressure, minimally increased aortic velocity without   signficant stenosis, trivial MR, normal LA size, dilated  IVC.   PHYSICAL EXAM  Temp:  [99.2 F (37.3 C)-100.7 F (38.2 C)] 99.6 F (37.6 C) (10/19 0400) Pulse Rate:  [62-110] 63 (10/19 0700) Resp:  [16-30] 20 (10/19 0700) BP: (116-159)/(58-106) 159/85 (10/19 0700) SpO2:  [92 %-100 %] 99 % (10/19 0700) FiO2 (%):  [30 %] 30 % (10/19 0700) Weight:  [121.2 kg] 121.2 kg (10/19 0300)  General -  Obese middle-aged African-American lady, still intubated, not in distress.     Ophthalmologic - fundi not visualized due to noncooperation.  Cardiovascular - Regular rate and rhythm.  Neuro - intubated, not on sedation. Eyes open, awake, alert, eyes open. No ptosis  EOMI, denies diplopia. PERRL. Right gaze nystagmus is minimal now. Corneal, gag and cough present. Facial symmetry and tongue not able to test due to ET tube. BUEs 0/5 and RLE 0/5, but LLE 1/5. Left positive babinski, right negative babinski. DTR diminished. Sensation not cooperative, coordination and gait not tested.   ASSESSMENT/PLAN Andrea [name] is a 57 y.o. female with history of difficult to control hypertension, insulin-dependent diabetes, obesity, hypothyroidism status post ablation presenting with chest discomfort, right-sided weakness, slurred speech and right facial droop. She did not receive IV t-PA due to late presentation. S/P stent assisted angioplasty of distal basilar artery.  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 Worsening symptoms with extension of pontine/medullary infarcts with new small bilateral cerebellar infarcts without evidence of stent re-stenosis or occlusion  Resultant b// UE and LE weakness, eyes minimally disconjugate but improving, nystagmus  CT head - No acute intracranial abnormalities.  CTA H&N 08/30/2018 - Extensive atherosclerotic disease in the basilar with focal critical stenosis in the mid to distal basilar. No definite acute thrombus identified. Severe stenosis left posterior cerebral  artery  MRI head - Acute nonhemorrhagic infarct involving the left paramedian brainstem.   CTA H&N 08/15/2018 - stent too dense to see BA lumen, but distal flow preserved. New small cerebellar infarcts since brain MRI yesterday.  Severe left P2 segment stenosis, also seen previously.   Repeat MRI - extension of b/l pontine and upper medullary infarcts with new b/l cerebellar infarcts.  2D Echo - EF 60-65%  LDL - 174  HgbA1c - 11.1  P2Y12 = 35  VTE prophylaxis - heparin subq  Diet - NPO  aspirin 81 mg daily prior to admission, was on Brilinta 90 mg daily and ASA 81 mg daily.  Brilinta currently on hold since 08/19/18 for elective PEG tube and tracheostomy next Monday. Currently on iv heparin till procedures  Patient counseled to be compliant with Andrea [name] antithrombotic medications  Ongoing aggressive stroke risk factor management  Therapy recommendations:  LTACH  Disposition:  Pending  Intracranial stenosis  BA mid to distal severe stenosis s/p BA stenting  Left PCA severe stenosis, R PCA moderate stenosis  Left MCA moderate stenosis  Uncontrolled stroke risk factors with HLD, HTN, DM  Non compliance with meds at home  Respiratory failure  Intubated  CCM on board  Tolerated weaning well  Plan for trach Monday  Hypertension  Stable  BP goal 130-160   On po norvasc, losartan  Metoprolol changed to Coreg 25 bid  Avoid low BP  Hyperlipidemia  Lipid lowering medication PTA:  Lipitor 20 mg daily  LDL 174, goal < 70  Increase to 80 mg daily  Continue statin at discharge  Diabetes  HgbA1c 11.4, goal < 7.0  Uncontrolled  Hyperglycemia improved  Lantus 15U bid  Continue basal NovoLog 4U Q4h  SSI  CBG monitoring  DM coordinator on board  Low grade fever with leukocytosis  Tmax overnight 100.7  Leukocytosis - 9.4->11.1->9.6->11.3->12.7->12.9  Check UA  Check CXR  Hypernatremia and elevated Cre/BUN  Na 156->156  Cre  1.41->1.40  Water deficit 5L total  Change NS to 1/2 NS @ 30  Increase free water from 200 Q8 to 300 Q4  Continue BMP monitoring  Dysphagia  On tube feeding 55 cc/h  IV fluids 30 cc/h  PO fluid 300 Q4h  Plan for PEG tube Friday  Other Stroke Risk Factors  Obesity, Body mass index is 45.86 kg/m., recommend weight  loss, diet and exercise as appropriate   Screen for sleep apnea when appropriate and may benefit from referral to [facility] Sleep team  Other Active Problems  Anemia due to acute blood loss - 11.1->9.7->9.4->9.3->9.4->8.7  Hypokalemia - 3.1 -> supplement->3.7->3.8->3.4->3.6  Hospital day # 9  This patient is critically ill due to worsening brainstem infarct, BA stenosis s/p stenting, hyperglycemia, hypertensive, fever and hypernatremia and at significant risk of neurological worsening, death form recurrent stroke, hemorrhagic conversion, CHF, seizure, aspiration. This patient's care requires constant monitoring of vital signs, hemodynamics, respiratory and cardiac monitoring, review of multiple databases, neurological assessment, discussion with family, other specialists and medical decision making of high complexity. I spent 35 minutes of neurocritical care time in the care of this patient.  Rosalin Hawking, MD PhD Stroke Neurology 08/23/2018 11:08 AM    To contact Stroke Continuity provider, please refer to [email or web address] After hours, contact General Neurology

## 2018-08-23 NOTE — Progress Notes (Signed)
FPTS Social Progress Note  S:Patient has been well overnight. No significant change. Sister at bedside. She is awake and alert during exam. Peg and trach planned for Monday. Critical care team still managing.  O: Awake and Alert BP (!) 159/85   Pulse 63   Temp 99.6 F (37.6 C) (Axillary)   Resp 20   Ht 5\' 4"  (1.626 m)   Wt 121.2 kg   LMP 07/26/2018   SpO2 99%   BMI 45.86 kg/m     A/P: Will Continue to monitor.  Danna Hefty, DO 08/23/2018, 7:53 AM PGY-1, [place] [facility] Service pager [phone]

## 2018-08-23 NOTE — Progress Notes (Signed)
ANTICOAGULATION CONSULT NOTE  Pharmacy Consult:  Heparin Indication: Intracerebral stent  Allergies  Allergen Reactions  . Tramadol     "Makes me sees things and I just dont act right"    Patient Measurements: Height: 5\' 4"  (162.6 cm) Weight: 267 lb 3.2 oz (121.2 kg) IBW/kg (Calculated) : 54.7 Heparin Dosing Weight: 85 kg  Vital Signs: Temp: 98.2 F (36.8 C) (10/19 0800) Temp Source: Axillary (10/19 0800) BP: 164/84 (10/19 1000) Pulse Rate: 69 (10/19 1000)  Labs: Recent Labs    08/21/18 0306 08/21/18 1224 08/22/18 0317 08/23/18 0531  HGB 8.7*  --  8.7* 8.7*  HCT 30.7*  --  30.7* 31.5*  PLT 491*  --  462* 541*  HEPARINUNFRC <0.10* 0.15* 0.21* 0.18*  CREATININE  --   --  1.41* 1.40*    Estimated Creatinine Clearance: 56.9 mL/min (A) (by C-G formula based on SCr of 1.4 mg/dL (H)).    Assessment: 32 YOF with recent intracerebral stent to continue IV heparin as bridge while Brilinta is on hold.  Heparin level is therapeutic; no bleeding reported.  Plan for trach/PEG on Monday.  Goal of Therapy:  Heparin level 0.1 - 0.25 units/ml Monitor platelets by anticoagulation protocol: Yes   Plan:  Continue heparin gtt at 850 units/hr Daily heparin level and CBC  Levester Fresh, PharmD, BCPS, BCCCP Clinical Pharmacist [phone]  Please check AMION for all [place] numbers  08/23/2018 11:49 AM

## 2018-08-24 DIAGNOSIS — R1312 Dysphagia, oropharyngeal phase: Secondary | ICD-10-CM

## 2018-08-24 LAB — GLUCOSE, CAPILLARY
GLUCOSE-CAPILLARY: 223 mg/dL — AB (ref 70–99)
GLUCOSE-CAPILLARY: 198 mg/dL — AB (ref 70–99)
Glucose-Capillary: 148 mg/dL — ABNORMAL HIGH (ref 70–99)
Glucose-Capillary: 185 mg/dL — ABNORMAL HIGH (ref 70–99)
Glucose-Capillary: 196 mg/dL — ABNORMAL HIGH (ref 70–99)
Glucose-Capillary: 201 mg/dL — ABNORMAL HIGH (ref 70–99)
Glucose-Capillary: 238 mg/dL — ABNORMAL HIGH (ref 70–99)

## 2018-08-24 LAB — BASIC METABOLIC PANEL
ANION GAP: 7 (ref 5–15)
BUN: 44 mg/dL — ABNORMAL HIGH (ref 6–20)
CALCIUM: 12 mg/dL — AB (ref 8.9–10.3)
CHLORIDE: 118 mmol/L — AB (ref 98–111)
CO2: 26 mmol/L (ref 22–32)
CREATININE: 1.22 mg/dL — AB (ref 0.44–1.00)
GFR calc non Af Amer: 48 mL/min — ABNORMAL LOW (ref >60)
GFR, EST AFRICAN AMERICAN: 56 mL/min — AB (ref >60)
Glucose, Bld: 209 mg/dL — ABNORMAL HIGH (ref 70–99)
Potassium: 3.7 mmol/L (ref 3.5–5.1)
SODIUM: 151 mmol/L — AB (ref 135–145)

## 2018-08-24 LAB — CBC
HCT: 31.1 % — ABNORMAL LOW (ref 36.0–46.0)
HEMOGLOBIN: 8.8 g/dL — AB (ref 12.0–15.0)
MCH: 23.2 pg — ABNORMAL LOW (ref 26.0–34.0)
MCHC: 28.3 g/dL — ABNORMAL LOW (ref 30.0–36.0)
MCV: 81.8 fL (ref 80.0–100.0)
NRBC: 0.4 % — AB (ref 0.0–0.2)
Platelets: 536 10*3/uL — ABNORMAL HIGH (ref 150–400)
RBC: 3.8 MIL/uL — ABNORMAL LOW (ref 3.87–5.11)
RDW: 16 % — AB (ref 11.5–15.5)
WBC: 14.4 10*3/uL — AB (ref 4.0–10.5)

## 2018-08-24 LAB — HEPARIN LEVEL (UNFRACTIONATED): HEPARIN UNFRACTIONATED: 0.32 [IU]/mL (ref 0.30–0.70)

## 2018-08-24 MED ORDER — HYDRALAZINE HCL 25 MG PO TABS
25.0000 mg | ORAL_TABLET | Freq: Three times a day (TID) | ORAL | Status: DC
  Administered 2018-08-24 (×2): 25 mg via ORAL
  Filled 2018-08-24 (×3): qty 1

## 2018-08-24 NOTE — Progress Notes (Signed)
STROKE TEAM PROGRESS NOTE   SUBJECTIVE (INTERVAL HISTORY) Her RN is at bedside. No family at bedside. She is mildly lethargic today but easily arousable and following commands, but not able to move all limbs today. CXR and UA no infection. Afebrile since yesterday, however still leukocystosis. Pending PEG and trach tomorrow. Na getting better, on D5 and free water.   OBJECTIVE Vitals:   08/24/18 0436 08/24/18 0500 08/24/18 0600 08/24/18 0700  BP:  (!) 165/83 (!) 171/88 (!) 173/84  Pulse:  73 73 75  Resp:  (!) 29 (!) 26 (!) 25  Temp: 99.1 F (37.3 C)     TempSrc: Axillary     SpO2:  100% 99% 98%  Weight:  123.4 kg    Height:        CBC:  Recent Labs  Lab 08/22/18 0317 08/23/18 0531 08/24/18 0542  WBC 10.3 12.9* 14.4*  NEUTROABS 6.9  --   --   HGB 8.7* 8.7* 8.8*  HCT 30.7* 31.5* 31.1*  MCV 83.0 82.5 81.8  PLT 462* 541* 536*    Basic Metabolic Panel:  Recent Labs  Lab 08/19/18 0718  08/23/18 0531 08/24/18 0542  NA 146*   < > 156* 151*  K 3.4*   < > 3.6 3.7  CL 116*   < > 122* 118*  CO2 20*   < > 27 26  GLUCOSE 276*   < > 146* 209*  BUN 36*   < > 52* 44*  CREATININE 1.07*   < > 1.40* 1.22*  CALCIUM 11.5*   < > 12.4* 12.0*  MG 2.1  --   --   --    < > = values in this interval not displayed.    Lipid Panel:     Component Value Date/Time   CHOL 256 (H) 08/15/2018 0512   TRIG 248 (H) 08/15/2018 0512   HDL 35 (L) 08/15/2018 0512   CHOLHDL 7.3 08/15/2018 0512   VLDL 50 (H) 08/15/2018 0512   LDLCALC 171 (H) 08/15/2018 0512   HgbA1c:  Lab Results  Component Value Date   HGBA1C 11.4 (H) 08/15/2018   Urine Drug Screen:     Component Value Date/Time   LABOPIA NONE DETECTED 08/08/2018 1836   COCAINSCRNUR NONE DETECTED 08/18/2018 1836   LABBENZ NONE DETECTED 08/20/2018 1836   AMPHETMU NONE DETECTED 08/26/2018 1836   THCU NONE DETECTED 09/02/2018 1836   LABBARB NONE DETECTED 09/03/2018 1836    Alcohol Level No results found for: ETH  IMAGING  Ct Angio  Head W Or Wo Contrast Ct Angio Neck W Or Wo Contrast 08/15/2018 IMPRESSION:  1. Interval basilar to left proximal PCA stenting. The density of the stent walls precludes detection of in stent stenosis; there is a degree of wasting at the mid basilar segment. There is flow in the left PCA beyond the stent such that there is presumed stent patency.  2. Known lower pontine infarct. There are new small cerebellar infarcts since brain MRI yesterday.  3. Severe left P2 segment stenosis, also seen previously.  4. Moderate atheromatous narrowing in the proximal left MCA.    Ct Angio Head W Or Wo Contrast Ct Angio Neck W Or Wo Contrast 08/11/2018 IMPRESSION:  1. 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 No definite acute thrombus identified.  2. Severe stenosis left posterior cerebral artery and moderate stenosis right posterior cerebral artery.  3. Mild stenosis left MCA and moderate stenosis left  MCA bifurcation.  4. No significant carotid or vertebral artery stenosis in the neck.    Ct Head Wo Contrast 08/22/2018 IMPRESSION:  No acute intracranial abnormalities. Mild white matter changes likely due to small vessel ischemia.    Mr Brain Wo Contrast 08/30/2018 IMPRESSION:  1. Acute nonhemorrhagic infarct involving the left paramedian brainstem. The infarct crosses midline.  2. Other periventricular and subcortical white matter disease is moderately advanced for age. This likely reflects the sequela of chronic microvascular ischemia.  3. Tapering of the dens with prominent soft tissue pannus. 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    Ct Head Code Stroke Wo Contrast 09/02/2018  IMPRESSION:  1. No acute abnormality and no change from earlier today  2. ASPECTS is 10 3.    Cerebral Angiogram 08/09/2018 S/P 4 vessel cerebral arteriogram RT CFA approach. Findings  1.Severe stenosis of distal basilar artery 90 %  ,associated with mod to severe ASVD of the mid basilar artery and Lt ANt cerebellar A. S/P stent assisted angioplasty of distal basilar artery with patency of 80 to 90 %. 2.Approx 70 % stenosis of LT ICA supraclinoid seg   MRI 08/15/18 1. Progressive acute pontine infarct. New patchy bilateral cerebellar infarction. New tiny left thalamic infarcts. 2. Preserved flow void in the stented basilar. 3. left facial/submandibular edema, please correlate with neck exam. Edited result: IMPRESSION: 1. Progressive acute pontine infarct. New patchy bilateral cerebellar infarction. New tiny left thalamic infarcts. 2. Preserved flow void in the stented basilar.  TTE - Left ventricle: The cavity size was normal. There was severe   concentric hypertrophy. Systolic function was normal. The   estimated ejection fraction was in the range of 60% to 65%. Wall   motion was normal; there were no regional wall motion   abnormalities. Doppler parameters are consistent with abnormal   left ventricular relaxation (grade 1 diastolic dysfunction). The   E/e&' ratio is <8, suggesting normal LV filling pressure. - Aortic valve: Trileaflet; mildly calcified leaflets.   Transvalvular velocity was minimally increased. There was no   regurgitation. Mean gradient (S): 12 mm Hg. - Mitral valve: Mildly thickened leaflets . There was trivial   regurgitation. - Left atrium: The atrium was normal in size. - Inferior vena cava: The vessel was dilated. The respirophasic   diameter changes were blunted (< 50%), consistent with elevated   central venous pressure. Impressions: - LVEF 60-65%, severe LVH, normal wall motion, grade 1 DD, normal   LV filling pressure, minimally increased aortic velocity without   signficant stenosis, trivial MR, normal LA size, dilated IVC.  Dg Chest Port 1 View  Result Date: 08/23/2018 CLINICAL DATA:  Encounter for fever. EXAM: PORTABLE CHEST 1 VIEW COMPARISON:  08/19/2018 FINDINGS:  Endotracheal tube is 3.6 cm above the carina. Airspace densities in the right hilum and right lower lung have markedly decreased and possibly resolved. There may be a small amount of residual disease in the medial right lower lung. Vascular crowding in the hilar regions. Low lung volumes. Heart size is grossly stable and within normal limits. Nasogastric tube extends into the abdomen. Negative for pneumothorax. IMPRESSION: Airspace disease in the right lung has markedly decreased. Low lung volumes. Support apparatuses as described. Electronically Signed   By: Markus Daft M.D.   On: 08/23/2018 11:07    PHYSICAL EXAM  Temp:  [98.2 F (36.8 C)-99.1 F (37.3 C)] 99.1 F (37.3 C) (10/20 0436) Pulse Rate:  [62-85] 75 (10/20 0700) Resp:  [20-30] 25 (10/20 0700) BP: (138-173)/(73-94)  173/84 (10/20 0700) SpO2:  [98 %-100 %] 98 % (10/20 0700) FiO2 (%):  [30 %] 30 % (10/20 0345) Weight:  [123.4 kg] 123.4 kg (10/20 0500)  General -  Obese middle-aged African-American lady, still intubated, not in distress, mild lethargy.     Ophthalmologic - fundi not visualized due to noncooperation.  Cardiovascular - Regular rate and rhythm.  Neuro - intubated, not on sedation. Mild lethargy, but easily arousable, eyes open, awake, aler. No ptosis or EOMI, denies diplopia. PERRL. Right gaze nystagmus is minimal now. Corneal, gag and cough present. Facial symmetry and tongue not able to test due to ET tube. Quadriplegia. Bilateral negative babinski. DTR diminished. Sensation not cooperative, coordination and gait not tested.   ASSESSMENT/PLAN Ms. LILLIANNE EICK is a 57 y.o. female with history of difficult to control hypertension, insulin-dependent diabetes, obesity, hypothyroidism status post ablation presenting with chest discomfort, right-sided weakness, slurred speech and right facial droop. She did not receive IV t-PA due to late presentation. S/P stent assisted angioplasty of distal basilar artery.  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 Worsening symptoms with extension of pontine/medullary infarcts with new small bilateral cerebellar infarcts without evidence of stent re-stenosis or occlusion  Resultant b// UE and LE weakness, eyes minimally disconjugate but improving, nystagmus  CT head - No acute intracranial abnormalities.  CTA H&N 09/04/2018 - Extensive atherosclerotic disease in the basilar with focal critical stenosis in the mid to distal basilar. No definite acute thrombus identified. Severe stenosis left posterior cerebral artery  MRI head - Acute nonhemorrhagic infarct involving the left paramedian brainstem.   CTA H&N 08/15/2018 - stent too dense to see BA lumen, but distal flow preserved. New small cerebellar infarcts since brain MRI yesterday.  Severe left P2 segment stenosis, also seen previously.   Repeat MRI - extension of b/l pontine and upper medullary infarcts with new b/l cerebellar infarcts.  2D Echo - EF 60-65%  LDL - 174  HgbA1c - 11.1  P2Y12 = 35  VTE prophylaxis - heparin subq  Diet - NPO  aspirin 81 mg daily prior to admission, was on Brilinta 90 mg daily and ASA 81 mg daily.  Brilinta currently on hold since 08/19/18 for elective PEG tube and tracheostomy next Monday. Continue on iv heparin till procedures  Patient counseled to be compliant with her antithrombotic medications  Ongoing aggressive stroke risk factor management  Therapy recommendations:  LTACH  Disposition:  Pending  Intracranial stenosis  BA mid to distal severe stenosis s/p BA stenting  Left PCA severe stenosis, R PCA moderate stenosis  Left MCA moderate stenosis  Uncontrolled stroke risk factors with HLD, HTN, DM  Non compliance with meds at home  Respiratory failure  Intubated  CCM on board  Tolerated weaning well  Plan for trach Monday    Hypertension  Stable on the high end  BP goal 130-160   On po norvasc,  losartan  Metoprolol changed to Coreg 25 bid  Will add hydralazine 25mg  Q8h   Hyperlipidemia  Lipid lowering medication PTA:  Lipitor 20 mg daily  LDL 174, goal < 70  Increase to 80 mg daily  Continue statin at discharge  Diabetes  HgbA1c 11.4, goal < 7.0  Uncontrolled  Hyperglycemia fluctuate  Lantus 15U bid  Continue basal NovoLog 4U Q4h  SSI  CBG monitoring  DM coordinator on board  Low grade fever with leukocytosis  Tmax 100.7 -> afebrile  Leukocytosis - 9.4->11.1->9.6->11.3->12.7->12.9->14.4  UA neg for UTI  CXR unremarkable, improving from prior  Hypernatremia and elevated Cre/BUN  Na 156->156->151  Cre 1.41->1.40->1.22  On D5 @ 50  Increased free water from 200 Q8 to 300 Q4  Continue BMP monitoring  Dysphagia  On tube feeding 55 cc/h  IV fluids 50 cc/h  PO fluid 300 Q4h  Plan for PEG tube Monday  Other Stroke Risk Factors  Obesity, Body mass index is 46.7 kg/m., recommend weight loss, diet and exercise as appropriate   Other Active Problems  Anemia due to acute blood loss - 11.1->9.7->9.4->9.3->9.4->8.8  Hypokalemia - supplement  Hospital day # 10  This patient is critically ill due to worsening brainstem infarct, BA stenosis s/p stenting, hyperglycemia, hypertensive, fever and hypernatremia and at significant risk of neurological worsening, death form recurrent stroke, hemorrhagic conversion, CHF, seizure, aspiration. This patient's care requires constant monitoring of vital signs, hemodynamics, respiratory and cardiac monitoring, review of multiple databases, neurological assessment, discussion with family, other specialists and medical decision making of high complexity. I spent 35 minutes of neurocritical care time in the care of this patient.  Rosalin Hawking, MD PhD Stroke Neurology 08/24/2018 9:51 AM     To contact Stroke Continuity provider, please refer to [email or web address] After hours, contact General Neurology

## 2018-08-24 NOTE — Progress Notes (Signed)
NAME:  Andrea [name], MRN:  [id number], DOB:  [birth year], LOS: 12 ADMISSION DATE:  08/19/2018, CONSULTATION DATE:  08/17/2018 REFERRING MD:  Dr. Rory Percy, CHIEF COMPLAINT:  CP/ Acute CVA  Brief History   72 yoF w/poorly controlled HTN and IDDM presenting with CP and left arm pain w/BP 220/117. Initial CTH neg.  Cardiac workup negative for acute event thus far, cardiology following.  Code stroke initiated for R hemiplegia, dysarthria and right facial droop.  Out of window for TPA.  Requiring cleviprex for BP control.  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  Evolving pontine stroke on MRI. Intubated for neuro IR s/p stenting and angioplasty with 80-90% patency.  Extubated post procedure but due to insufficency placed on BiPAP and brought to ICU.    Past Medical History  HTN, IDDM, hypothyroidism s/p ablation  Significant Hospital Events   10/9 present to [facility] ED -transferred to [facility] 10/9 basilar artery stenting 10/9 intubated urgently 6h post procedure for inability to protect airway. 10/10 CTA for declining exam - evolving pontine infarct, stent deemed to be patent 10/14 Brillinta stopped and ASA/ heparin started to in anticipation of tracheostomy on Monday. 10/14 through 10/19: Neuro exam has been stable sodium increasing in spite of free water via tube, providing supportive care awaiting trach 10/20: Sodium down to 151 from 156 clinically looks the same  Consults: date of consult/date signed off & final recs:  Cards 10/10 Neurology 10/10 PCCM 10/10  Procedures (surgical and bedside):  10/10 Neuro IR >> s/p stent and angioplasty of distal basilar artery  10/10 ETT for procedure 10/10 ETT (reintubated) >> 24mm  Significant Diagnostic Tests:  08/31/2018  CTH >>  No acute intracranial abnormalities. Mild white matter changes likely due to small vessel ischemia  08/1018 CTH >> 1. No acute abnormality and no change from earlier today 2. ASPECTS is 10  08/08/2018   CTA head and neck >>  1. 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 No definite acute thrombus identified. 2. Severe stenosis left posterior cerebral artery and moderate stenosis right posterior cerebral artery. 3. Mild stenosis left MCA and moderate stenosis left MCA bifurcation. 4. No significant carotid or vertebral artery stenosis in the neck.  08/09/2018 MRI - evolving pontine infarct.  08/15/18  Echocardiogram - normal LVSF with severe LVH.   Micro Data:  08/18/2018 MRSA PCR >>negative  Antimicrobials:  10/10 cefazolin preop  Subjective:  Is comfortable  Objective   Blood pressure (Abnormal) 170/86, pulse 71, temperature 99.1 F (37.3 C), temperature source Axillary, resp. rate (Abnormal) 28, height 5\' 4"  (1.626 m), weight 123.4 kg, last menstrual period 07/26/2018, SpO2 99 %.    Vent Mode: CPAP;PSV FiO2 (%):  [30 %] 30 % Set Rate:  [15 bmp] 15 bmp Vt Set:  [430 mL] 430 mL PEEP:  [5 cmH20] 5 cmH20 Pressure Support:  [10 cmH20] 10 cmH20 Plateau Pressure:  [10 cmH20-15 cmH20] 12 cmH20   Intake/Output Summary (Last 24 hours) at 08/24/2018 1117 Last data filed at 08/24/2018 0600 Gross per 24 hour  Intake 4518.59 ml  Output 1350 ml  Net 3168.59 ml   Filed Weights   08/22/18 0400 08/23/18 0300 08/24/18 0500  Weight: 121.8 kg 121.2 kg 123.4 kg    Examination: General obese 57 year old African-American female currently resting on the ventilator HEENT normocephalic atraumatic no jugular venous distention orally intubated Pulmonary: Coarse scattered rhonchi no accessory use Cardiac: Regular rate  and rhythm without murmur rub or gallop Abdomen: Soft nontender GU: Clear yellow Neuro: Awake, interactive eyes cannot move extremities   Resolved Hospital Problem list    Assessment & Plan:   Acute ischemic pontine stroke with stent placement in basilar to left proximal PCA Resulting in inadequate airway  protection, now inability to follow commands in the extremities.   Swallowing dysfunction is not expected to improve sufficiently to permit extubation so tracheostomy and PEG tube are planned for Monday 10/21 Plan Plan to change to heparin and with aspirin as we await PEG and trach  High risk for stent occlusion, will watch her closely for neurological decline  Continue supportive care   Respiratory Insufficiency due to inability to protect airway from dysarthria.  Expected with location of stroke.  Portable chest x-ray personally reviewed:Clear chest x-ray, decreased/low volume film endotracheal tube in satisfactory position Plan Continuing full ventilator support PAD with RASS goal -1-0 Trach 10/21  Hypertensive Crisis Improved blood pressure control. Have reintroduced oral anti-hypertensives.  Plan  Long-term blood pressure goal less than 130 acutely we will treat for greater than 180  Continuing current Norvasc dosing  Hydralazine added 25 every 8  Type 2 diabetes Plan Sliding scale insulin  Hypernatremia, with increased creatinine. Consistent with volume contraction.  Creatinine about the same.  Sodium improved from 156 down to 151 over the last 24 hours Plan New current rate of free water at 300 every 4 hours, and D5 water at 50 cc an hour  Repeat a.m. chemistry  Strict intake output   Disposition / Summary of Today's Plan 08/24/18    No change in plan of care, plan for PEG tube and trach on 10/21   Best Practice  Diet: TF Pain/Anxiety/Delirium protocol (if indicated): N/a VAP protocol (if indicated): no DVT prophylaxis: SCDs  GI prophylaxis: Famotidine Hyperglycemia protocol: SSI, lantus Mobility: bedrest Code Status: Full  Family Communication: Updated the patient's sister and niece at bedside on 10/14  Labs ( personally  Reviewed)  CBC: Recent Labs  Lab 08/20/18 0358 08/21/18 0306 08/22/18 0317 08/23/18 0531 08/24/18 0542  WBC 12.7* 11.3* 10.3 12.9*  14.4*  NEUTROABS  --   --  6.9  --   --   HGB 9.4* 8.7* 8.7* 8.7* 8.8*  HCT 32.8* 30.7* 30.7* 31.5* 31.1*  MCV 79.6* 80.8 83.0 82.5 81.8  PLT 489* 491* 462* 541* 536*    Basic Metabolic Panel: Recent Labs  Lab 08/18/18 0626 08/19/18 0718 08/22/18 0317 08/23/18 0531 08/24/18 0542  NA 143 146* 155* 156* 151*  K 3.3* 3.4* 3.8 3.6 3.7  CL 116* 116* 123* 122* 118*  CO2 21* 20* 22 27 26   GLUCOSE 206* 276* 137* 146* 209*  BUN 28* 36* 52* 52* 44*  CREATININE 0.95 1.07* 1.41* 1.40* 1.22*  CALCIUM 10.4* 11.5* 12.5* 12.4* 12.0*  MG  --  2.1  --   --   --    GFR: Estimated Creatinine Clearance: 66 mL/min (A) (by C-G formula based on SCr of 1.22 mg/dL (H)). Recent Labs  Lab 08/21/18 0306 08/22/18 0317 08/23/18 0531 08/24/18 0542  WBC 11.3* 10.3 12.9* 14.4*    Liver Function Tests: No results for input(s): AST, ALT, ALKPHOS, BILITOT, PROT, ALBUMIN in the last 168 hours. No results for input(s): LIPASE, AMYLASE in the last 168 hours. No results for input(s): AMMONIA in the last 168 hours.  ABG    Component Value Date/Time   PHART 7.334 (L) 08/18/2018 2259   PCO2ART 41.9 08/21/2018  2259   PO2ART 275.0 (H) 08/12/2018 2259   HCO3 22.3 08/25/2018 2259   TCO2 24 09/03/2018 2259   ACIDBASEDEF 3.0 (H) 08/11/2018 2259   O2SAT 100.0 08/25/2018 2259     Coagulation Profile: Recent Labs  Lab 08/18/18 0626  INR 1.12    Cardiac Enzymes: No results for input(s): CKTOTAL, CKMB, CKMBINDEX, TROPONINI in the last 168 hours.  HbA1C: Hgb A1c MFr Bld  Date/Time Value Ref Range Status  08/15/2018 05:12 AM 11.4 (H) 4.8 - 5.6 % Final    Comment:    (NOTE) Pre diabetes:          5.7%-6.4% Diabetes:              >6.4% Glycemic control for   <7.0% adults with diabetes   01/12/2017 02:43 AM 12.8 (H) 4.8 - 5.6 % Final    Comment:    (NOTE)         Pre-diabetes: 5.7 - 6.4         Diabetes: >6.4         Glycemic control for adults with diabetes: <7.0     CBG: Recent Labs  Lab  08/23/18 1510 08/23/18 2048 08/24/18 0013 08/24/18 0426 08/24/18 0853  GLUCAP 178* 215* 196* 148* [place] ACNP-BC [place] Pager # [phone] OR # [phone] if no answer

## 2018-08-24 NOTE — Progress Notes (Signed)
Appreciate great care provided by primary team. Family medicine will continue to follow along.   [place] Shiana Rappleye, DO PGY-2, [place] [facility]

## 2018-08-24 NOTE — Progress Notes (Signed)
ANTICOAGULATION CONSULT NOTE  Pharmacy Consult:  Heparin Indication: Intracerebral stent  Allergies  Allergen Reactions  . Tramadol     "Makes me sees things and I just dont act right"    Patient Measurements: Height: 5\' 4"  (162.6 cm) Weight: 267 lb 3.2 oz (121.2 kg) IBW/kg (Calculated) : 54.7 Heparin Dosing Weight: 85 kg  Vital Signs: Temp: 99.1 F (37.3 C) (10/20 0436) Temp Source: Axillary (10/20 0436) BP: 156/82 (10/20 0345) Pulse Rate: 70 (10/20 0345)  Labs: Recent Labs    08/22/18 0317 08/23/18 0531 08/24/18 0542  HGB 8.7* 8.7* 8.8*  HCT 30.7* 31.5* 31.1*  PLT 462* 541* 536*  HEPARINUNFRC 0.21* 0.18* 0.32  CREATININE 1.41* 1.40* 1.22*    Estimated Creatinine Clearance: 65.3 mL/min (A) (by C-G formula based on SCr of 1.22 mg/dL (H)).    Assessment: 33 YOF with recent intracerebral stent to continue IV heparin as bridge while Brilinta is on hold.  Heparin level is therapeutic; no bleeding reported.  Plan for trach/PEG on Monday.  Goal of Therapy:  Heparin level 0.1 - 0.25 units/ml Monitor platelets by anticoagulation protocol: Yes   Plan:  Decrease IV heparin to 800 units/hr. Daily heparin level and CBC  [place] Crane, Roylene Reason, [facility] Clinical Pharmacist Phone [phone]  08/24/2018 7:08 AM   Please check AMION for all [place] numbers

## 2018-08-25 ENCOUNTER — Encounter (HOSPITAL_COMMUNITY): Admission: EM | Disposition: E | Payer: Self-pay | Source: Home / Self Care | Attending: Neurology

## 2018-08-25 ENCOUNTER — Inpatient Hospital Stay (HOSPITAL_COMMUNITY): Payer: Medicaid Other

## 2018-08-25 DIAGNOSIS — I10 Essential (primary) hypertension: Secondary | ICD-10-CM

## 2018-08-25 DIAGNOSIS — I6523 Occlusion and stenosis of bilateral carotid arteries: Secondary | ICD-10-CM

## 2018-08-25 DIAGNOSIS — J9600 Acute respiratory failure, unspecified whether with hypoxia or hypercapnia: Secondary | ICD-10-CM

## 2018-08-25 DIAGNOSIS — Z930 Tracheostomy status: Secondary | ICD-10-CM

## 2018-08-25 DIAGNOSIS — G9340 Encephalopathy, unspecified: Secondary | ICD-10-CM

## 2018-08-25 DIAGNOSIS — R509 Fever, unspecified: Secondary | ICD-10-CM

## 2018-08-25 HISTORY — PX: ESOPHAGOGASTRODUODENOSCOPY: SHX5428

## 2018-08-25 HISTORY — PX: PEG PLACEMENT: SHX5437

## 2018-08-25 LAB — PROTIME-INR
INR: 1.2
Prothrombin Time: 15.1 seconds (ref 11.4–15.2)

## 2018-08-25 LAB — CBC
HCT: 29.5 % — ABNORMAL LOW (ref 36.0–46.0)
HEMOGLOBIN: 8.2 g/dL — AB (ref 12.0–15.0)
MCH: 22.8 pg — AB (ref 26.0–34.0)
MCHC: 27.8 g/dL — AB (ref 30.0–36.0)
MCV: 81.9 fL (ref 80.0–100.0)
PLATELETS: 558 10*3/uL — AB (ref 150–400)
RBC: 3.6 MIL/uL — AB (ref 3.87–5.11)
RDW: 15.7 % — ABNORMAL HIGH (ref 11.5–15.5)
WBC: 14.3 10*3/uL — ABNORMAL HIGH (ref 4.0–10.5)
nRBC: 0.4 % — ABNORMAL HIGH (ref 0.0–0.2)

## 2018-08-25 LAB — BASIC METABOLIC PANEL
Anion gap: 7 (ref 5–15)
BUN: 38 mg/dL — AB (ref 6–20)
CO2: 26 mmol/L (ref 22–32)
CREATININE: 1.16 mg/dL — AB (ref 0.44–1.00)
Calcium: 11.7 mg/dL — ABNORMAL HIGH (ref 8.9–10.3)
Chloride: 115 mmol/L — ABNORMAL HIGH (ref 98–111)
GFR calc Af Amer: 59 mL/min — ABNORMAL LOW (ref >60)
GFR, EST NON AFRICAN AMERICAN: 51 mL/min — AB (ref >60)
GLUCOSE: 174 mg/dL — AB (ref 70–99)
POTASSIUM: 3.6 mmol/L (ref 3.5–5.1)
SODIUM: 148 mmol/L — AB (ref 135–145)

## 2018-08-25 LAB — GLUCOSE, CAPILLARY
GLUCOSE-CAPILLARY: 164 mg/dL — AB (ref 70–99)
Glucose-Capillary: 130 mg/dL — ABNORMAL HIGH (ref 70–99)
Glucose-Capillary: 134 mg/dL — ABNORMAL HIGH (ref 70–99)
Glucose-Capillary: 161 mg/dL — ABNORMAL HIGH (ref 70–99)
Glucose-Capillary: 165 mg/dL — ABNORMAL HIGH (ref 70–99)
Glucose-Capillary: 165 mg/dL — ABNORMAL HIGH (ref 70–99)

## 2018-08-25 LAB — HEPARIN LEVEL (UNFRACTIONATED): Heparin Unfractionated: <0.1 IU/mL — ABNORMAL LOW (ref 0.30–0.70)

## 2018-08-25 SURGERY — EGD (ESOPHAGOGASTRODUODENOSCOPY)
Anesthesia: Moderate Sedation

## 2018-08-25 MED ORDER — HYDRALAZINE HCL 25 MG PO TABS
25.0000 mg | ORAL_TABLET | Freq: Three times a day (TID) | ORAL | Status: DC
  Administered 2018-08-25 – 2018-08-26 (×2): 25 mg
  Filled 2018-08-25 (×2): qty 1

## 2018-08-25 MED ORDER — VITAL HIGH PROTEIN PO LIQD
1000.0000 mL | ORAL | Status: DC
  Administered 2018-08-25 – 2018-08-26 (×2): 1000 mL

## 2018-08-25 MED ORDER — INSULIN GLARGINE 100 UNIT/ML ~~LOC~~ SOLN
15.0000 [IU] | Freq: Two times a day (BID) | SUBCUTANEOUS | Status: DC
  Administered 2018-08-25: 15 [IU] via SUBCUTANEOUS
  Filled 2018-08-25 (×2): qty 0.15

## 2018-08-25 MED ORDER — HYDROCODONE-ACETAMINOPHEN 5-325 MG PO TABS
1.0000 | ORAL_TABLET | ORAL | Status: DC | PRN
  Administered 2018-08-25 – 2018-08-29 (×8): 1
  Filled 2018-08-25 (×8): qty 1

## 2018-08-25 MED ORDER — VECURONIUM BROMIDE 10 MG IV SOLR
INTRAVENOUS | Status: AC
  Administered 2018-08-25: 10 mg
  Filled 2018-08-25: qty 10

## 2018-08-25 MED ORDER — PRO-STAT SUGAR FREE PO LIQD
30.0000 mL | Freq: Three times a day (TID) | ORAL | Status: DC
  Administered 2018-08-25 – 2018-08-27 (×6): 30 mL
  Filled 2018-08-25 (×5): qty 30

## 2018-08-25 MED ORDER — MIDAZOLAM HCL 2 MG/2ML IJ SOLN
INTRAMUSCULAR | Status: AC
  Filled 2018-08-25: qty 4

## 2018-08-25 MED ORDER — FERROUS SULFATE 325 (65 FE) MG PO TABS
325.0000 mg | ORAL_TABLET | Freq: Three times a day (TID) | ORAL | Status: DC
  Administered 2018-08-25 – 2018-09-07 (×38): 325 mg via ORAL
  Filled 2018-08-25 (×38): qty 1

## 2018-08-25 MED ORDER — FENTANYL CITRATE (PF) 100 MCG/2ML IJ SOLN
INTRAMUSCULAR | Status: AC
  Administered 2018-08-25: 100 ug
  Filled 2018-08-25: qty 2

## 2018-08-25 MED ORDER — ATORVASTATIN CALCIUM 80 MG PO TABS
80.0000 mg | ORAL_TABLET | Freq: Every day | ORAL | Status: DC
  Administered 2018-08-25 – 2018-11-12 (×76): 80 mg
  Filled 2018-08-25 (×80): qty 1

## 2018-08-25 NOTE — Progress Notes (Addendum)
NAME:  Andrea [name], MRN:  [id number], DOB:  03/16/1961, LOS: 13 ADMISSION DATE:  08/23/2018, CONSULTATION DATE:  08/29/2018 REFERRING MD:  Dr. Rory Percy, CHIEF COMPLAINT:  CP/ Acute CVA  Brief History   57 yoF w/poorly controlled HTN and IDDM presenting with CP and left arm pain w/BP 220/117. Initial CTH neg.  Cardiac workup negative for acute event thus far, cardiology following.  Code stroke initiated for R hemiplegia, dysarthria and right facial droop.  Out of window for TPA.  Requiring cleviprex for BP control.  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  Evolving pontine stroke on MRI. Intubated for neuro IR s/p stenting and angioplasty with 80-90% patency.  Extubated post procedure but due to insufficency placed on BiPAP and brought to ICU.    Past Medical History  HTN, IDDM, hypothyroidism s/p ablation  Significant Hospital Events   10/9 present to [facility] ED -transferred to [facility] 10/9 basilar artery stenting 10/9 intubated urgently 6h post procedure for inability to protect airway. 10/10 CTA for declining exam - evolving pontine infarct, stent deemed to be patent 10/14 Brillinta stopped and ASA/ heparin started to in anticipation of tracheostomy on Monday. 10/14 through 10/19: Neuro exam has been stable sodium increasing in spite of free water via tube, providing supportive care awaiting trach 10/20: Sodium down to 151 from 156 clinically looks the same 10/21: Sodium down to 148, sputum culture sent for purulent tracheal secretions, awaiting trach and PEG Consults: date of consult/date signed off & final recs:  Cards 10/10 Neurology 10/10 PCCM 10/10  Procedures (surgical and bedside):  10/10 Neuro IR >> s/p stent and angioplasty of distal basilar artery  10/10 ETT for procedure 10/10 ETT (reintubated) >> 66mm  Significant Diagnostic Tests:  08/29/2018  CTH >>  No acute intracranial abnormalities. Mild white matter changes likely due to small vessel  ischemia  08/1018 CTH >> 1. No acute abnormality and no change from earlier today 2. ASPECTS is 10  08/24/2018  CTA head and neck >>  1. 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 No definite acute thrombus identified. 2. Severe stenosis left posterior cerebral artery and moderate stenosis right posterior cerebral artery. 3. Mild stenosis left MCA and moderate stenosis left MCA bifurcation. 4. No significant carotid or vertebral artery stenosis in the neck.  08/16/2018 MRI - evolving pontine infarct.  08/15/18  Echocardiogram - normal LVSF with severe LVH.   Micro Data:  08/12/2018 MRSA PCR >>negative 10/21: Sputum culture>>>  Antimicrobials:  10/10 cefazolin preop  Subjective:  Denies discomfort  Objective   Blood pressure (Abnormal) 160/76, pulse 70, temperature 99.5 F (37.5 C), temperature source Axillary, resp. rate 17, height 5\' 4"  (1.626 m), weight 122.8 kg, last menstrual period 07/26/2018, SpO2 99 %.    Vent Mode: PRVC FiO2 (%):  [30 %] 30 % Set Rate:  [15 bmp] 15 bmp Vt Set:  [430 mL] 430 mL PEEP:  [5 cmH20] 5 cmH20 Pressure Support:  [10 cmH20] 10 cmH20 Plateau Pressure:  [9 cmH20-14 cmH20] 9 cmH20   Intake/Output Summary (Last 24 hours) at 08/18/2018 0849 Last data filed at 08/07/2018 0600 Gross per 24 hour  Intake 2993.14 ml  Output 2750 ml  Net 243.14 ml   Filed Weights   08/23/18 0300 08/24/18 0500 08/26/2018 0000  Weight: 121.2 kg 123.4 kg 122.8 kg    Examination: 57 year old African-American female currently awake, interactive but unable to move extremities she  is on full ventilator support HEENT normocephalic atraumatic.  She has purulent discharge from her right nare, copious oral secretions, her tongue and lips are large and swollen pupils reactive orally intubated Pulmonary: Coarse scattered rhonchi throughout Cardiac: Regular rate and rhythm Abdomen: Soft, not tender, no appreciable  organomegaly Extremities: Warm, dry, brisk capillary refill Neuro: Nods appropriately tracks in the room, unable to follow commands otherwise   Resolved Hospital Problem list    Assessment & Plan:   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   Swallowing dysfunction is not expected to improve sufficiently to permit extubation so tracheostomy and PEG tube are planned for Monday 10/21 Plan Heparin with aspirin as we await PEG and trach eventually going back to Brilinta, given concern about risk for stent occlusion  Likely need skilled nursing long-term  Respiratory Insufficiency due to inability to protect airway from dysarthria.  Expected with location of stroke.  Portable chest x-ray personally reviewed:Clear chest x-ray, decreased/low volume film endotracheal tube in satisfactory position Plan Continuing full ventilator support with goal to initiate aerosol trach collar trials as soon is tracheostomy complete PAD protocol RASS goal 0 VAP bundle  Increased tracheal secretions, nasal discharge, and leukocytosis -Worrisome for sinusitis and for tracheobronchitis Plan Sputum culture Will have bronchoscopy today, if significant purulent secretions will start empiric antibiotics  Hypertensive Crisis Improved blood pressure control. Have reintroduced oral anti-hypertensives.  Plan  Continued titration of antihypertensives, currently on Norvasc 10 mg daily, hydralazine added on 20th, still running in the 160s.  We will hold her at her current dosing until after tracheostomy, if remains hypertensive we can titrate up to hydralazine  Type 2 diabetes Plan Sliding scale insulin And Lantus  Hypernatremia, with increased creatinine. Consistent with volume contraction.  Creatinine about the same.  Sodium improved from 156 down to 148 over the last 48  hours Plan Free water replacement with D5 water, will continue at 50 cc an hour  Disposition / Summary of Today's Plan 08/23/2018    No change in plan of care, plan for PEG tube and trach on 10/21   Best Practice  Diet: TF Pain/Anxiety/Delirium protocol (if indicated): N/a VAP protocol (if indicated): no DVT prophylaxis: SCDs  GI prophylaxis: Famotidine Hyperglycemia protocol: SSI, lantus Mobility: bedrest Code Status: Full  Family Communication: Updated the patient's sister and niece at bedside on 10/14  Labs ( personally  Reviewed)  CBC: Recent Labs  Lab 08/21/18 0306 08/22/18 0317 08/23/18 0531 08/24/18 0542 08/24/2018 0646  WBC 11.3* 10.3 12.9* 14.4* 14.3*  NEUTROABS  --  6.9  --   --   --   HGB 8.7* 8.7* 8.7* 8.8* 8.2*  HCT 30.7* 30.7* 31.5* 31.1* 29.5*  MCV 80.8 83.0 82.5 81.8 81.9  PLT 491* 462* 541* 536* 558*    Basic Metabolic Panel: Recent Labs  Lab 08/19/18 0718 08/22/18 0317 08/23/18 0531 08/24/18 0542 08/24/2018 0646  NA 146* 155* 156* 151* 148*  K 3.4* 3.8 3.6 3.7 3.6  CL 116* 123* 122* 118* 115*  CO2 20* 22 27 26 26   GLUCOSE 276* 137* 146* 209* 174*  BUN 36* 52* 52* 44* 38*  CREATININE 1.07* 1.41* 1.40* 1.22* 1.16*  CALCIUM 11.5* 12.5* 12.4* 12.0* 11.7*  MG 2.1  --   --   --   --    GFR: Estimated Creatinine Clearance: 69.2 mL/min (A) (by C-G formula based on  SCr of 1.16 mg/dL (H)). Recent Labs  Lab 08/22/18 0317 08/23/18 0531 08/24/18 0542 08/21/2018 0646  WBC 10.3 12.9* 14.4* 14.3*    Liver Function Tests: No results for input(s): AST, ALT, ALKPHOS, BILITOT, PROT, ALBUMIN in the last 168 hours. No results for input(s): LIPASE, AMYLASE in the last 168 hours. No results for input(s): AMMONIA in the last 168 hours.  ABG    Component Value Date/Time   PHART 7.334 (L) 08/19/2018 2259   PCO2ART 41.9 08/08/2018 2259   PO2ART 275.0 (H) 08/05/2018 2259   HCO3 22.3 08/30/2018 2259   TCO2 24 08/08/2018 2259   ACIDBASEDEF 3.0 (H) 08/24/2018  2259   O2SAT 100.0 08/11/2018 2259     Coagulation Profile: Recent Labs  Lab 08/21/2018 0646  INR 1.20    Cardiac Enzymes: No results for input(s): CKTOTAL, CKMB, CKMBINDEX, TROPONINI in the last 168 hours.  HbA1C: Hgb A1c MFr Bld  Date/Time Value Ref Range Status  08/15/2018 05:12 AM 11.4 (H) 4.8 - 5.6 % Final    Comment:    (NOTE) Pre diabetes:          5.7%-6.4% Diabetes:              >6.4% Glycemic control for   <7.0% adults with diabetes   01/12/2017 02:43 AM 12.8 (H) 4.8 - 5.6 % Final    Comment:    (NOTE)         Pre-diabetes: 5.7 - 6.4         Diabetes: >6.4         Glycemic control for adults with diabetes: <7.0     CBG: Recent Labs  Lab 08/24/18 1630 08/24/18 2021 08/24/18 2348 08/10/2018 0314 08/23/2018 0813  GLUCAP 201* 198* 185* 165* [place] ACNP-BC [place] Pager # [phone] OR # [phone] if no answer  Attending Note:  57 year old female with history of HTN and IDDM presenting with CVA outside the window of tPA.  Patient was intubated for airway protection and has done some but not significant improvement from a medical standpoint.  On exam, lungs with coarse BS diffusely.  I reviewed CXR myself, ETT is in a good position.  Discussed with PCCM-NP.  Will proceed with tracheostomy today.  Proceed with weaning as able.  Titrate O2 for sat of 88-92%.  PEG to be placed today.  PCCM will continue to follow.  The patient is critically ill with multiple organ systems failure and requires high complexity decision making for assessment and support, frequent evaluation and titration of therapies, application of advanced monitoring technologies and extensive interpretation of multiple databases.   Critical Care Time devoted to patient care services described in this note is  34  Minutes. This time reflects time of care of this signee Dr Jennet Maduro. This critical care time does not reflect procedure time, or teaching time or  supervisory time of PA/NP/Med student/Med Resident etc but could involve care discussion time.  Rush Farmer, M.D. [facility] Pulmonary/Critical Care Medicine. Pager: [phone]. After hours pager: [phone].

## 2018-08-25 NOTE — Op Note (Signed)
[facility] Patient Name: Andrea [name] Procedure Date : 08/10/2018 MRN: [id number] Attending MD: Rikki Spearing, MD Date of Birth: [birth year] CSN: [id number] Age: 57 Admit Type: Inpatient Procedure:                Upper GI endoscopy Indications:              Place PEG because patient is unable to eat, Place                            PEG due to neurological disorder causing impaired                            swallowing Providers:                Judeth Horn, MD, Elspeth Cho, Technician Referring MD:              Medicines:                Fentanyl 150 micrograms IV, Midazolam 4 mg IV,                            norcuron 10 mg IV Complications:            No immediate complications. Estimated Blood Loss:      Procedure:                Pre-Anesthesia Assessment:                           - This assessment was completed at 01:05 AM, prior                            to the administration of sedation.                           - After reviewing the risks and benefits, the                            patient was deemed in satisfactory condition to                            undergo the procedure.                           - ASA Grade Assessment: IV - A patient with severe                            systemic disease that is a constant threat to life.                           After obtaining informed consent, the endoscope was                            passed under direct vision. Throughout the                            procedure, the  patient's blood pressure, pulse, and                            oxygen saturations were monitored continuously. The                            [id number]-H190 ([id number]) Olympus Adult EGD was introduced                            through the mouth, and advanced to the second part                            of duodenum. The upper GI endoscopy was                            accomplished without difficulty. The patient   tolerated the procedure well. The total duration of                            the procedure was 10 minutes. Scope In: Scope Out: Findings:      The esophagus was normal.      The examined duodenum was normal.      The entire examined stomach was normal. The patient was placed in the       supine position for PEG placement. The stomach was insufflated to appose       gastric and abdominal walls. A site was located in the body of the       stomach with excellent manual external pressure for placement. The       abdominal wall was marked and prepped in a sterile manner. The area was       anesthetized with 4 mL of 0.5%/1:200,000 lidocaine/epinephrine. The       trocar needle was introduced through the abdominal wall and into the       stomach under direct endoscopic view. A snare was introduced through the       endoscope and opened in the gastric lumen. The guide wire was passed       through the trocar and into the open snare. The snare was closed around       the guide wire. The endoscope and snare were removed, pulling the wire       out through the mouth. A skin incision was made at the site of needle       insertion. The externally removable 24 Fr EndoVive Safety gastrostomy       tube was lubricated. The G-tube was tied to the guide wire and pulled       through the mouth and into the stomach. The trocar needle was removed,       and the gastrostomy tube was pulled out from the stomach through the       skin. The external bumper was attached to the gastrostomy tube, and the       tube was cut to remove the guide wire. The final position of the       gastrostomy tube was confirmed by relook endoscopy, and skin marking       noted to be 4 cm at the external bumper. The final tension and       compression of  the abdominal wall by the PEG tube and external bumper       were checked and revealed that the bumper was moderately tight and       mildly deforming the skin and that the PEG  balloon was moderately tight       and mildly compressing the stomach. The feeding tube was capped, and the       tube site cleaned and dressed. Impression:               - Normal esophagus.                           - Normal examined duodenum.                           - Normal stomach.                           - An externally removable PEG placement was                            successfully completed.                           - No specimens collected. Recommendation:           - Please follow the post-PEG recommendations                            including: NPO x4 hrs then water today, use PEG                            today after checked by physician and start using                            PEG today. Procedure Code(s):        --- Professional ---                           [phone], Esophagogastroduodenoscopy, flexible,                            transoral; with directed placement of percutaneous                            gastrostomy tube Diagnosis Code(s):        --- Professional ---                           R63.3, Feeding difficulties                           Z43.1, Encounter for attention to gastrostomy                           R29.818, Other symptoms and signs involving the                            nervous system  R13.10, Dysphagia, unspecified CPT copyright 2018 American Medical Association. All rights reserved. The codes documented in this report are preliminary and upon coder review may  be revised to meet current compliance requirements. Rikki Spearing, MD Judeth Horn III, MD 08/13/2018 3:44:39 PM This report has been signed electronically. Number of Addenda: 0

## 2018-08-25 NOTE — Progress Notes (Signed)
ANTICOAGULATION CONSULT NOTE  Pharmacy Consult:  Heparin Indication: Intracerebral stent  Allergies  Allergen Reactions  . Tramadol     "Makes me sees things and I just dont act right"    Patient Measurements: Height: 5\' 4"  (162.6 cm) Weight: 270 lb 11.6 oz (122.8 kg) IBW/kg (Calculated) : 54.7 Heparin Dosing Weight: 85 kg  Vital Signs: Temp: 99.5 F (37.5 C) (10/21 0800) Temp Source: Axillary (10/21 0800) BP: 162/81 (10/21 0900) Pulse Rate: 71 (10/21 0900)  Labs: Recent Labs    08/23/18 0531 08/24/18 0542 09/03/2018 0646  HGB 8.7* 8.8* 8.2*  HCT 31.5* 31.1* 29.5*  PLT 541* 536* 558*  LABPROT  --   --  15.1  INR  --   --  1.20  HEPARINUNFRC 0.18* 0.32 <0.10*  CREATININE 1.40* 1.22* 1.16*    Estimated Creatinine Clearance: 69.2 mL/min (A) (by C-G formula based on SCr of 1.16 mg/dL (H)).    Assessment: 14 YOF with recent intracerebral stent to continue IV heparin as bridge while Brilinta is on hold.  Patient is scheduled for trach/PEG today. IV heparin held this AM at ~0500. HL collected ~ 2 hours after was undetectable.   Goal of Therapy:  Heparin level 0.1 - 0.25 units/ml Monitor platelets by anticoagulation protocol: Yes   Plan:  Continue to hold IV heparin Likely can transition back to [place] after surgery   Albertina Parr, PharmD., BCPS Clinical Pharmacist Clinical phone for 09/02/2018 until 3:30pm: [phone] If after 3:30pm, please refer to [facility] for unit-specific pharmacist

## 2018-08-25 NOTE — Progress Notes (Signed)
Nutrition Follow-up  DOCUMENTATION CODES:   Morbid obesity  INTERVENTION:   Once PEG ready for use recommend resume:  Vital High Protein @ 55 ml/hr (1320 ml/day) 30 ml Prostat TID  Provides: 1520 kcal, 145 grams protein, and 1103 ml free water.  Total free water: 2903 ml   NUTRITION DIAGNOSIS:   Inadequate oral intake related to inability to eat as evidenced by NPO status. Ongoing  GOAL:   Patient will meet greater than or equal to 90% of their needs Not met.   MONITOR:   Vent status, TF tolerance, I & O's, Labs  ASSESSMENT:   Andrea [name] is a 57 yo female with PMH of type 2 diabetes, HTN, CKD III, obesity, and anemia admitted for chest pain. Code Stroke called 10/10 in AM. 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 Intubated 10/10 in ICU.   Pt discussed during ICU rounds and with RN.  Pt out of room for trach and PEG placement Weight stable  Patient is currently intubated on ventilator support Temp (24hrs), Avg:99.4 F (37.4 C), Min:98.7 F (37.1 C), Max:100 F (37.8 C)  Medications reviewed and include: colace, ferrous sulfate TID, SSI, 4 units novolog every 4 hours, lantus 15 units BID, MVI 300 ml free water every 4 hours Labs reviewed: Na 148 (H) CBG (last 3)  Recent Labs    08/08/2018 0314 08/31/2018 0813 08/16/2018 1213  GLUCAP 165* 165* 164*   Lab Results  Component Value Date   HGBA1C 11.4 (H) 08/15/2018       Diet Order:   Diet Order            Diet NPO time specified  Diet effective ____              EDUCATION NEEDS:   Not appropriate for education at this time  Skin:  Skin Assessment: Reviewed RN Assessment  Last BM:  10/20 - large  Height:   Ht Readings from Last 1 Encounters:  08/15/18 _0  (1.626 m)    Weight:   Wt Readings from Last 1 Encounters:  08/11/2018 122.8 kg    Ideal Body Weight:  54.5 kg  BMI:  Body mass index is 46.47 kg/m.  Estimated Nutritional Needs:   Kcal:  1355-1725 (11-14 kcal/kg  BW)  Protein:  136 grams (2.5 g/kg IBW)   Fluid:  >1.5 L/day  Maylon Peppers RD, LDN, CNSC [phone] Pager [phone] After Hours Pager\

## 2018-08-25 NOTE — Progress Notes (Signed)
Physical Therapy Treatment Patient Details Name: Andrea [name] MRN: [id number] DOB: 05/15/1961 Today's Date: 08/27/2018    History of Present Illness 57 yo presented to ED with chest pain noted Rt hemiparesis in ED with acute Left paramedian brainstem infarct. Intubated 10/10 for angioplasty, extubated post procedure and reintubated. Repeat MRI with brainstem infarct extension and bil cerebellar infarcts. PMHx: HTN, DM, CKD    PT Comments    Pt able to maintain eye opening during session however cont to have minimal active movement x4 extremities. Pt tolerated EOB sitting with total assistx7 minutes. Pt with constant coughing requiring freq suctioning. Acute PT to cont to follow.    Follow Up Recommendations  [place] [facility] bed;Wheelchair (measurements PT);Other (comment)    Recommendations for Other Services       Precautions / Restrictions Precautions Precautions: Fall Precaution Comments: vent Restrictions Weight Bearing Restrictions: No    Mobility  Bed Mobility Overal bed mobility: Needs Assistance Bed Mobility: Supine to Sit;Sit to Supine     Supine to sit: Total assist;+2 for physical assistance Sit to supine: Total assist;+2 for physical assistance   General bed mobility comments: Two person total assist with HOB elevated, assist to support trunk and move legs to EOB.  Pt not initiating any movement to help, but also not resistive or pushing.   Transfers                 General transfer comment: unable to complete  Ambulation/Gait             General Gait Details: unable   Stairs             Wheelchair Mobility    Modified Rankin (Stroke Patients Only) Modified Rankin (Stroke Patients Only) Pre-Morbid Rankin Score: No symptoms Modified Rankin: Severe disability     Balance Overall balance assessment: Needs assistance Sitting-balance support: Bilateral upper extremity supported Sitting  balance-Leahy Scale: Zero Sitting balance - Comments: pt unable to support self with UEs. Pt dependent on physical support  to maintain EOB balance                                    Cognition Arousal/Alertness: Lethargic Behavior During Therapy: Flat affect Overall Cognitive Status: Impaired/Different from baseline Area of Impairment: Attention;Memory;Following commands;Safety/judgement;Problem solving                   Current Attention Level: Sustained   Following Commands: Follows one step commands inconsistently Safety/Judgement: Decreased awareness of safety;Decreased awareness of deficits   Problem Solving: Decreased initiation;Requires tactile cues;Requires verbal cues;Difficulty sequencing;Slow processing General Comments: pt tried to shake head yes/no to answer questions      Exercises Other Exercises Other Exercises: bilat LE PROM to hip, knee, ankle. pt with trace activation on L LE, none on R    General Comments General comments (skin integrity, edema, etc.): pt with alot of secreations requring frequent suctioning, VSS      Pertinent Vitals/Pain Pain Assessment: Faces Faces Pain Scale: No hurt Pain Descriptors / Indicators: Grimacing Pain Intervention(s): Monitored during session    Home Living                      Prior Function            PT Goals (current goals can now be found in the care plan section) Progress towards  PT goals: Progressing toward goals    Frequency    Min 2X/week      PT Plan Current plan remains appropriate    Co-evaluation              AM-PAC PT "6 Clicks" Daily Activity  Outcome Measure  Difficulty turning over in bed (including adjusting bedclothes, sheets and blankets)?: Unable Difficulty moving from lying on back to sitting on the side of the bed? : Unable Difficulty sitting down on and standing up from a chair with arms (e.g., wheelchair, bedside commode, etc,.)?: Unable Help  needed moving to and from a bed to chair (including a wheelchair)?: Total Help needed walking in hospital room?: Total Help needed climbing 3-5 steps with a railing? : Total 6 Click Score: 6    End of Session Equipment Utilized During Treatment: (vent) Activity Tolerance: Patient tolerated treatment well Patient left: in bed;with call bell/phone within reach;with family/visitor present Nurse Communication: Mobility status;Need for lift equipment PT Visit Diagnosis: Other abnormalities of gait and mobility (R26.89);Muscle weakness (generalized) (M62.81);Other symptoms and signs involving the nervous system (R29.898);Hemiplegia and hemiparesis Hemiplegia - Right/Left: Right Hemiplegia - dominant/non-dominant: Dominant Hemiplegia - caused by: Cerebral infarction     Time: 6295-2841 PT Time Calculation (min) (ACUTE ONLY): 21 min  Charges:  $Therapeutic Activity: 8-22 mins                     Kittie Plater, PT, DPT Acute Rehabilitation Services Pager #: [phone] Office #: [phone]    Berline Lopes 08/30/2018, 12:18 PM

## 2018-08-25 NOTE — Progress Notes (Signed)
Chest PT not done last rounds. Patient had trach placed and broch performed. Will continue next round.

## 2018-08-25 NOTE — Procedures (Signed)
Percutaneous Tracheostomy Placement  Consent from family.  Patient sedated, paralyzed and position.  Placed on 100% FiO2 and RR matched.  Area cleaned and draped.  Lidocaine/epi injected.  Skin incision done followed by blunt dissection.  Trachea palpated then punctured, catheter passed and visualized bronchoscopically.  Wire placed and visualized.  Catheter removed.  Airway then entered and dilated.  Size 6 cuffed shiley trach placed and visualized bronchoscopically well above carina.  Good volume returns.  Patient tolerated the procedure well without complications.  Minimal blood loss.  CXR ordered and pending.  Eliab Closson G. Mollye Guinta, M.D. [facility]. Pager: [phone]. After hours pager: [phone].  

## 2018-08-25 NOTE — Procedures (Signed)
Bronchoscopy Procedure Note AUBRIEE SZETO [id number] [birth year]  Procedure: Bronchoscopy Indications: Obtain specimens for culture and/or other diagnostic studies and assistance w/ trach placement   Procedure Details Consent: Risks of procedure as well as the alternatives and risks of each were explained to the (patient/caregiver).  Consent for procedure obtained. Time Out: Verified patient identification, verified procedure, site/side was marked, verified correct patient position, special equipment/implants available, medications/allergies/relevent history reviewed, required imaging and test results available.  Performed  In preparation for procedure, patient was given 100% FiO2 and bronchoscope lubricated. Sedation: Muscle relaxants, Etomidate and fentanyl  Procedure done by P Judee Hennick ACNP-BC, under direct supervision of Dr Nelda Marseille. At first bronch was introduce through ET tube and structures of tracheal rings, carina identified for operator of tracheostomy who was Dr Nelda Marseille . Light of bronch passed through trachea and skin for indentification of tracheal rings for tracheostomy puncture. After this, under bronchoscopy guidance,  ET tube was pulled back sufficiently and very carefully. The ET tube was  pulled back enough to give room for tracheostomy operator and yet at same time to to ensure a secured airway. After this was accomplished, bronchoscope was withdrawn into the ET tube. After this,  Dr Nelda Marseille  then performed tracheostomy under video visual provided by flexible video bronchoscopy. Followng introduction of tracheostomy,  the bronchoscope was removed from ET tube and introduced through tracheostomy. Correct position of tracheostomy was ensured, with enough room between carina and distal tracheostomy and no evidence of bleeding. The bronchoscope was then withdrawn. Respiratory therapist was then instructed to remove the ET tube.  Dr Nelda Marseille  then proceeded to complete the tracheostomy with  stay sutures   No complications  Airway entered and the following bronchi were examined: RUL, RML, RLL, LUL and LLL.   Procedures performed: Brushings performed Bronchoscope removed.    Evaluation Hemodynamic Status: BP stable throughout; O2 sats: stable throughout Patient's Current Condition: stable Specimens:  Sent purulent fluid which was sampled from the RUL. All other airways were clear   Complications: No apparent complications Patient did tolerate procedure well.   Clementeen Graham 08/06/2018

## 2018-08-25 NOTE — Progress Notes (Signed)
STROKE TEAM PROGRESS NOTE   SUBJECTIVE (INTERVAL HISTORY) Her RN is at bedside. She is awake alert and eyes open. As per RN, pt moving both legs with PT this morning when sitting in bed. Plan for PEG and trach today. Heparin IV on hold now.    OBJECTIVE Vitals:   08/17/2018 0900 08/21/2018 1000 08/14/2018 1140 09/03/2018 1141  BP: (!) 162/81 138/74 125/71   Pulse: 71 64 66   Resp: (!) 23 (!) 23 (!) 24   Temp:      TempSrc:      SpO2: 99% 99% 98% 98%  Weight:      Height:        CBC:  Recent Labs  Lab 08/22/18 0317  08/24/18 0542 08/21/2018 0646  WBC 10.3   < > 14.4* 14.3*  NEUTROABS 6.9  --   --   --   HGB 8.7*   < > 8.8* 8.2*  HCT 30.7*   < > 31.1* 29.5*  MCV 83.0   < > 81.8 81.9  PLT 462*   < > 536* 558*   < > = values in this interval not displayed.    Basic Metabolic Panel:  Recent Labs  Lab 08/19/18 0718  08/24/18 0542 08/11/2018 0646  NA 146*   < > 151* 148*  K 3.4*   < > 3.7 3.6  CL 116*   < > 118* 115*  CO2 20*   < > 26 26  GLUCOSE 276*   < > 209* 174*  BUN 36*   < > 44* 38*  CREATININE 1.07*   < > 1.22* 1.16*  CALCIUM 11.5*   < > 12.0* 11.7*  MG 2.1  --   --   --    < > = values in this interval not displayed.    Lipid Panel:     Component Value Date/Time   CHOL 256 (H) 08/15/2018 0512   TRIG 248 (H) 08/15/2018 0512   HDL 35 (L) 08/15/2018 0512   CHOLHDL 7.3 08/15/2018 0512   VLDL 50 (H) 08/15/2018 0512   LDLCALC 171 (H) 08/15/2018 0512   HgbA1c:  Lab Results  Component Value Date   HGBA1C 11.4 (H) 08/15/2018   Urine Drug Screen:     Component Value Date/Time   LABOPIA NONE DETECTED 09/04/2018 1836   COCAINSCRNUR NONE DETECTED 08/29/2018 1836   LABBENZ NONE DETECTED 09/01/2018 1836   AMPHETMU NONE DETECTED 08/27/2018 1836   THCU NONE DETECTED 08/28/2018 1836   LABBARB NONE DETECTED 08/06/2018 1836    Alcohol Level No results found for: ETH  IMAGING  Ct Angio Head W Or Wo Contrast Ct Angio Neck W Or Wo Contrast 08/15/2018 IMPRESSION:  1.  Interval basilar to left proximal PCA stenting. The density of the stent walls precludes detection of in stent stenosis; there is a degree of wasting at the mid basilar segment. There is flow in the left PCA beyond the stent such that there is presumed stent patency.  2. Known lower pontine infarct. There are new small cerebellar infarcts since brain MRI yesterday.  3. Severe left P2 segment stenosis, also seen previously.  4. Moderate atheromatous narrowing in the proximal left MCA.    Ct Angio Head W Or Wo Contrast Ct Angio Neck W Or Wo Contrast 08/30/2018 IMPRESSION:  1. 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 No definite acute thrombus identified.  2. Severe stenosis left posterior cerebral artery and  moderate stenosis right posterior cerebral artery.  3. Mild stenosis left MCA and moderate stenosis left MCA bifurcation.  4. No significant carotid or vertebral artery stenosis in the neck.    Ct Head Wo Contrast 08/17/2018 IMPRESSION:  No acute intracranial abnormalities. Mild white matter changes likely due to small vessel ischemia.    Mr Brain Wo Contrast 08/12/2018 IMPRESSION:  1. Acute nonhemorrhagic infarct involving the left paramedian brainstem. The infarct crosses midline.  2. Other periventricular and subcortical white matter disease is moderately advanced for age. This likely reflects the sequela of chronic microvascular ischemia.  3. Tapering of the dens with prominent soft tissue pannus. 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    Ct Head Code Stroke Wo Contrast 08/29/2018  IMPRESSION:  1. No acute abnormality and no change from earlier today  2. ASPECTS is 10 3.    Cerebral Angiogram 09/01/2018 S/P 4 vessel cerebral arteriogram RT CFA approach. Findings  1.Severe stenosis of distal basilar artery 90 % ,associated with mod to severe ASVD of the mid basilar artery and Lt ANt cerebellar  A. S/P stent assisted angioplasty of distal basilar artery with patency of 80 to 90 %. 2.Approx 70 % stenosis of LT ICA supraclinoid seg   MRI 08/15/18 1. Progressive acute pontine infarct. New patchy bilateral cerebellar infarction. New tiny left thalamic infarcts. 2. Preserved flow void in the stented basilar. 3. left facial/submandibular edema, please correlate with neck exam. Edited result: IMPRESSION: 1. Progressive acute pontine infarct. New patchy bilateral cerebellar infarction. New tiny left thalamic infarcts. 2. Preserved flow void in the stented basilar.  TTE - Left ventricle: The cavity size was normal. There was severe   concentric hypertrophy. Systolic function was normal. The   estimated ejection fraction was in the range of 60% to 65%. Wall   motion was normal; there were no regional wall motion   abnormalities. Doppler parameters are consistent with abnormal   left ventricular relaxation (grade 1 diastolic dysfunction). The   E/e&' ratio is <8, suggesting normal LV filling pressure. - Aortic valve: Trileaflet; mildly calcified leaflets.   Transvalvular velocity was minimally increased. There was no   regurgitation. Mean gradient (S): 12 mm Hg. - Mitral valve: Mildly thickened leaflets . There was trivial   regurgitation. - Left atrium: The atrium was normal in size. - Inferior vena cava: The vessel was dilated. The respirophasic   diameter changes were blunted (< 50%), consistent with elevated   central venous pressure. Impressions: - LVEF 60-65%, severe LVH, normal wall motion, grade 1 DD, normal   LV filling pressure, minimally increased aortic velocity without   signficant stenosis, trivial MR, normal LA size, dilated IVC.  Dg Chest Port 1 View  Result Date: 08/23/2018 CLINICAL DATA:  Encounter for fever. EXAM: PORTABLE CHEST 1 VIEW COMPARISON:  08/19/2018 FINDINGS: Endotracheal tube is 3.6 cm above the carina. Airspace densities in the right hilum and  right lower lung have markedly decreased and possibly resolved. There may be a small amount of residual disease in the medial right lower lung. Vascular crowding in the hilar regions. Low lung volumes. Heart size is grossly stable and within normal limits. Nasogastric tube extends into the abdomen. Negative for pneumothorax. IMPRESSION: Airspace disease in the right lung has markedly decreased. Low lung volumes. Support apparatuses as described. Electronically Signed   By: Markus Daft M.D.   On: 08/23/2018 11:07    PHYSICAL EXAM  Temp:  [98.7 F (37.1 C)-99.7 F (37.6 C)] 99.5 F (37.5 C) (10/21  0800) Pulse Rate:  [64-87] 66 (10/21 1140) Resp:  [17-30] 24 (10/21 1140) BP: (119-171)/(67-81) 125/71 (10/21 1140) SpO2:  [93 %-100 %] 98 % (10/21 1141) FiO2 (%):  [30 %] 30 % (10/21 1141) Weight:  [122.8 kg] 122.8 kg (10/21 0000)  General -  Obese middle-aged African-American lady, still intubated, not in distress, mild lethargy.     Ophthalmologic - fundi not visualized due to noncooperation.  Cardiovascular - Regular rate and rhythm.  Neuro - intubated, not on sedation. Mild lethargy, but eyes open, awake, alert. No ptosis or EOMI, denies diplopia. PERRL. Right gaze nystagmus is improved but still persistent. Corneal, gag and cough present. Facial symmetry and tongue not able to test due to ET tube. Quadriplegia, with left LE 1/5 on pain stimulation but as per RN, pt moved both legs 3-/5 knee extension with PT this am sitting in bed. Bilateral negative babinski. DTR diminished. Sensation not cooperative, coordination and gait not tested.   ASSESSMENT/PLAN Andrea [name] is a 57 y.o. female with history of difficult to control hypertension, insulin-dependent diabetes, obesity, hypothyroidism status post ablation presenting with chest discomfort, right-sided weakness, slurred speech and right facial droop. She did not receive IV t-PA due to late presentation. S/P stent assisted angioplasty of  distal basilar artery.  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 Worsening symptoms with extension of pontine/medullary infarcts with new small bilateral cerebellar infarcts without evidence of stent re-stenosis or occlusion  Resultant b// UE and LE weakness, eyes minimally disconjugate but improving, nystagmus  CT head - No acute intracranial abnormalities.  CTA H&N 09/02/2018 - Extensive atherosclerotic disease in the basilar with focal critical stenosis in the mid to distal basilar. No definite acute thrombus identified. Severe stenosis left posterior cerebral artery  MRI head - Acute nonhemorrhagic infarct involving the left paramedian brainstem.   CTA H&N 08/15/2018 - stent too dense to see BA lumen, but distal flow preserved. New small cerebellar infarcts since brain MRI yesterday.  Severe left P2 segment stenosis, also seen previously.   Repeat MRI - extension of b/l pontine and upper medullary infarcts with new b/l cerebellar infarcts.  2D Echo - EF 60-65%  LDL - 174  HgbA1c - 11.1  P2Y12 = 35  VTE prophylaxis - heparin subq  Diet - NPO  aspirin 81 mg daily prior to admission, was on Brilinta 90 mg daily and ASA 81 mg daily.  Brilinta currently on hold since 08/19/18 for elective PEG tube and tracheostomy. IV heparin on hold now for today procedure  Patient counseled to be compliant with her antithrombotic medications  Ongoing aggressive stroke risk factor management  Therapy recommendations:  LTACH  Disposition:  Pending  Intracranial stenosis  BA mid to distal severe stenosis s/p BA stenting  Left PCA severe stenosis, R PCA moderate stenosis  Left MCA moderate stenosis  Uncontrolled stroke risk factors with HLD, HTN, DM  Non compliance with meds at home  Respiratory failure  Intubated  CCM on board  Tolerated weaning well  Plan for trach today  Heparin IV on hold now  Hypertension  Stable   BP  goal 130-160   On po norvasc, losartan  Metoprolol changed to Coreg 25 bid  On hydralazine 25mg  Q8h also  Hyperlipidemia  Lipid lowering medication PTA:  Lipitor 20 mg daily  LDL 174, goal < 70  Increase to 80 mg daily  Continue statin at discharge  Diabetes  HgbA1c 11.4, goal < 7.0  Uncontrolled  Hyperglycemia fluctuate  Lantus 15U bid  Continue basal NovoLog 4U Q4h  SSI  CBG monitoring  DM coordinator on board  Low grade fever with leukocytosis  Tmax 100.7 -> afebrile  Leukocytosis - 9.4->11.1->9.6->11.3->12.7->12.9->14.4->14.3  UA neg for UTI  CXR unremarkable, improving from prior  Hypernatremia and elevated Cre/BUN  Na 156->156->151->148  Cre 1.41->1.40->1.22->1.16  On D5 @ 50 - consider to d/c once Na normalized  on free water 300 Q4  Continue BMP monitoring  Dysphagia  On tube feeding 55 cc/h  IV fluids 50 cc/h  PO fluid 300 Q4h  Plan for PEG today, heparin on hold  Anemia   Hb 11.1->9.7->9.4->9.3->9.4->8.8->8.2  Likely due to iron deficiency and acute blood loss  Iron panel pending  Put on iron pills  Other Stroke Risk Factors  Obesity, Body mass index is 46.47 kg/m., recommend weight loss, diet and exercise as appropriate   Other Active Problems  Hypokalemia - supplement  Hospital day # 11  This patient is critically ill due to worsening brainstem infarct, BA stenosis s/p stenting, hyperglycemia, hypertensive, fever and hypernatremia and at significant risk of neurological worsening, death form recurrent stroke, hemorrhagic conversion, CHF, seizure, aspiration. This patient's care requires constant monitoring of vital signs, hemodynamics, respiratory and cardiac monitoring, review of multiple databases, neurological assessment, discussion with family, other specialists and medical decision making of high complexity. I spent 35 minutes of neurocritical care time in the care of this patient.  Rosalin Hawking, MD PhD Stroke  Neurology 08/09/2018 12:01 PM     To contact Stroke Continuity provider, please refer to [email or web address] After hours, contact General Neurology

## 2018-08-26 DIAGNOSIS — N183 Chronic kidney disease, stage 3 (moderate): Secondary | ICD-10-CM

## 2018-08-26 DIAGNOSIS — I160 Hypertensive urgency: Secondary | ICD-10-CM

## 2018-08-26 DIAGNOSIS — Z931 Gastrostomy status: Secondary | ICD-10-CM

## 2018-08-26 LAB — BASIC METABOLIC PANEL
Anion gap: 7 (ref 5–15)
BUN: 39 mg/dL — AB (ref 6–20)
CO2: 25 mmol/L (ref 22–32)
CREATININE: 1.22 mg/dL — AB (ref 0.44–1.00)
Calcium: 11.6 mg/dL — ABNORMAL HIGH (ref 8.9–10.3)
Chloride: 114 mmol/L — ABNORMAL HIGH (ref 98–111)
GFR calc Af Amer: 56 mL/min — ABNORMAL LOW (ref >60)
GFR, EST NON AFRICAN AMERICAN: 48 mL/min — AB (ref >60)
GLUCOSE: 217 mg/dL — AB (ref 70–99)
POTASSIUM: 3.7 mmol/L (ref 3.5–5.1)
SODIUM: 146 mmol/L — AB (ref 135–145)

## 2018-08-26 LAB — GLUCOSE, CAPILLARY
GLUCOSE-CAPILLARY: 281 mg/dL — AB (ref 70–99)
Glucose-Capillary: 190 mg/dL — ABNORMAL HIGH (ref 70–99)
Glucose-Capillary: 211 mg/dL — ABNORMAL HIGH (ref 70–99)
Glucose-Capillary: 223 mg/dL — ABNORMAL HIGH (ref 70–99)
Glucose-Capillary: 239 mg/dL — ABNORMAL HIGH (ref 70–99)
Glucose-Capillary: 249 mg/dL — ABNORMAL HIGH (ref 70–99)

## 2018-08-26 LAB — IRON AND TIBC
IRON: 21 ug/dL — AB (ref 28–170)
Saturation Ratios: 6 % — ABNORMAL LOW (ref 10.4–31.8)
TIBC: 340 ug/dL (ref 250–450)
UIBC: 319 ug/dL

## 2018-08-26 LAB — CBC
HCT: 28.7 % — ABNORMAL LOW (ref 36.0–46.0)
Hemoglobin: 8.2 g/dL — ABNORMAL LOW (ref 12.0–15.0)
MCH: 23.4 pg — AB (ref 26.0–34.0)
MCHC: 28.6 g/dL — ABNORMAL LOW (ref 30.0–36.0)
MCV: 81.8 fL (ref 80.0–100.0)
PLATELETS: 506 10*3/uL — AB (ref 150–400)
RBC: 3.51 MIL/uL — AB (ref 3.87–5.11)
RDW: 15.8 % — ABNORMAL HIGH (ref 11.5–15.5)
WBC: 16.1 10*3/uL — ABNORMAL HIGH (ref 4.0–10.5)
nRBC: 0.3 % — ABNORMAL HIGH (ref 0.0–0.2)

## 2018-08-26 LAB — HEPARIN LEVEL (UNFRACTIONATED)

## 2018-08-26 LAB — FERRITIN: Ferritin: 71 ng/mL (ref 11–307)

## 2018-08-26 MED ORDER — INSULIN GLARGINE 100 UNIT/ML ~~LOC~~ SOLN
18.0000 [IU] | Freq: Two times a day (BID) | SUBCUTANEOUS | Status: DC
  Administered 2018-08-26 – 2018-08-28 (×5): 18 [IU] via SUBCUTANEOUS
  Filled 2018-08-26 (×5): qty 0.18

## 2018-08-26 MED ORDER — VANCOMYCIN HCL 10 G IV SOLR
2500.0000 mg | Freq: Once | INTRAVENOUS | Status: AC
  Administered 2018-08-26: 2500 mg via INTRAVENOUS
  Filled 2018-08-26: qty 2500

## 2018-08-26 MED ORDER — CHLORHEXIDINE GLUCONATE 0.12 % MT SOLN
OROMUCOSAL | Status: AC
  Administered 2018-08-26: 15 mL via OROMUCOSAL
  Filled 2018-08-26: qty 15

## 2018-08-26 MED ORDER — VANCOMYCIN HCL IN DEXTROSE 1-5 GM/200ML-% IV SOLN
1000.0000 mg | Freq: Two times a day (BID) | INTRAVENOUS | Status: DC
  Administered 2018-08-26 – 2018-08-29 (×6): 1000 mg via INTRAVENOUS
  Filled 2018-08-26 (×6): qty 200

## 2018-08-26 MED ORDER — SODIUM CHLORIDE 0.9 % IV SOLN
1.0000 g | Freq: Three times a day (TID) | INTRAVENOUS | Status: DC
  Administered 2018-08-26 – 2018-08-29 (×10): 1 g via INTRAVENOUS
  Filled 2018-08-26 (×11): qty 1

## 2018-08-26 MED ORDER — TICAGRELOR 90 MG PO TABS
90.0000 mg | ORAL_TABLET | Freq: Every day | ORAL | Status: DC
  Administered 2018-08-26 – 2018-08-28 (×3): 90 mg
  Filled 2018-08-26 (×3): qty 1

## 2018-08-26 MED ORDER — HYDROCHLOROTHIAZIDE 10 MG/ML ORAL SUSPENSION
25.0000 mg | Freq: Every day | ORAL | Status: DC
  Administered 2018-08-26 – 2018-08-31 (×6): 25 mg
  Filled 2018-08-26 (×8): qty 2.5

## 2018-08-26 MED ORDER — SODIUM CHLORIDE 0.9 % IV SOLN
INTRAVENOUS | Status: DC | PRN
  Administered 2018-08-26 – 2018-09-12 (×5): 250 mL via INTRAVENOUS

## 2018-08-26 NOTE — Progress Notes (Signed)
Family Medicine Teaching Service Social Note  Saw patient this morning.  She had a tracheostomy and PEG placed on yesterday October 21.  She was initiating her own breaths and was responsive to my voice by nodding appropriately to questions.  Family Medicine Teaching Service appreciates the excellent care provided by the Neuro ICU team and will resume care when patient is transferred out of the ICU.

## 2018-08-26 NOTE — Progress Notes (Signed)
Chest PT was held last rounds. Occupational therapy moving patient around, sitting her up and trach collar trial were performed. Will start back next round.

## 2018-08-26 NOTE — Final Consult Note (Signed)
[facility] Progress Note  1 Day Post-Op  Subjective: CC: PEG Patient underwent PEG placement yesterday. Tolerating TF at 55cc/h.  Objective: Vital signs in last 24 hours: Temp:  [98.5 F (36.9 C)-100.7 F (38.2 C)] 100.7 F (38.2 C) (10/22 0800) Pulse Rate:  [64-83] 75 (10/22 0857) Resp:  [19-30] 24 (10/22 0857) BP: (105-145)/(63-88) 136/72 (10/22 0857) SpO2:  [97 %-100 %] 100 % (10/22 0857) FiO2 (%):  [30 %-60 %] 40 % (10/22 0857) Last BM Date: 08/24/18  Intake/Output from previous day: 10/21 0701 - 10/22 0700 In: 1968.9 [I.V.:1244.7; NG/GT:724.2] Out: 2010 [Urine:2010] Intake/Output this shift: Total I/O In: 105 [I.V.:50; NG/GT:55] Out: -   PE: Card:  Regular rate and rhythm Neck: trach site c/d/i Abd: Soft, non-distended, bowel sounds present, PEG in place and c/d/i, abdominal binder present Skin: warm and dry, no rashes    Lab Results:  Recent Labs    09/04/2018 0646 08/26/18 0406  WBC 14.3* 16.1*  HGB 8.2* 8.2*  HCT 29.5* 28.7*  PLT 558* 506*   BMET Recent Labs    08/10/2018 0646 08/26/18 0406  NA 148* 146*  K 3.6 3.7  CL 115* 114*  CO2 26 25  GLUCOSE 174* 217*  BUN 38* 39*  CREATININE 1.16* 1.22*  CALCIUM 11.7* 11.6*   PT/INR Recent Labs    08/24/2018 0646  LABPROT 15.1  INR 1.20   CMP     Component Value Date/Time   NA 146 (H) 08/26/2018 0406   K 3.7 08/26/2018 0406   CL 114 (H) 08/26/2018 0406   CO2 25 08/26/2018 0406   GLUCOSE 217 (H) 08/26/2018 0406   BUN 39 (H) 08/26/2018 0406   CREATININE 1.22 (H) 08/26/2018 0406   CREATININE 0.91 04/09/2016 1213   CALCIUM 11.6 (H) 08/26/2018 0406   PROT 7.8 01/23/2018 1352   ALBUMIN 3.0 (L) 01/23/2018 1352   AST 14 (L) 01/23/2018 1352   ALT 11 (L) 01/23/2018 1352   ALKPHOS 92 01/23/2018 1352   BILITOT 0.8 01/23/2018 1352   GFRNONAA 48 (L) 08/26/2018 0406   GFRNONAA 71 04/09/2016 1213   GFRAA 56 (L) 08/26/2018 0406   GFRAA 82 04/09/2016 1213   Lipase     Component Value  Date/Time   LIPASE 41 01/23/2018 1352       Studies/Results: Dg Chest Port 1 View  Result Date: 08/30/2018 CLINICAL DATA:  57 year old female with tracheostomy present. Subsequent encounter. EXAM: PORTABLE CHEST 1 VIEW COMPARISON:  08/23/2018 chest x-ray. FINDINGS: Endotracheal tube converted to tracheostomy tube which is located midline with tip 5 cm above the carina. No pneumomediastinum or pneumothorax detected. Accentuated mediastinal and cardiac silhouette by technique. Heart appears enlarged. Central pulmonary vascular prominence without pulmonary edema. Left base subsegmental atelectasis. IMPRESSION: Tracheostomy tube midline with tip 5 cm above the carina. Electronically Signed   By: Genia Del M.D.   On: 08/10/2018 15:33    Anti-infectives: Anti-infectives (From admission, onward)   Start     Dose/Rate Route Frequency Ordered Stop   08/26/18 2300  vancomycin (VANCOCIN) IVPB 1000 mg/200 mL premix     1,000 mg 200 mL/hr over 60 Minutes Intravenous Every 12 hours 08/26/18 0923     08/26/18 1000  vancomycin (VANCOCIN) 2,500 mg in sodium chloride 0.9 % 500 mL IVPB     2,500 mg 250 mL/hr over 120 Minutes Intravenous  Once 08/26/18 0923     08/26/18 1000  cefTAZidime (FORTAZ) 1 g in sodium chloride 0.9 % 100 mL IVPB  1 g 200 mL/hr over 30 Minutes Intravenous Every 8 hours 08/26/18 0923     08/30/2018 1014  ceFAZolin (ANCEF) 2-4 GM/100ML-% IVPB    Note to Pharmacy:  Roma Kayser  : cabinet override      09/03/2018 1014 08/18/2018 2229       Assessment/Plan Acute ischemic pontine stroke - last dose Brilinta 10/10, continue to hold Stent placement interval basilar to left proximal PCA Vent dependent respiratory failure - s/p trach yesterday, per CCM HTN Type 2 DM Hypothyroidism  Dysphagia - S/P bedside PEG placement yesterday Dr. Hulen Skains - Tolerating tube feeds well, site c/d/i - continue abd binder to protect tube  ID - fortaz/vanc 10/22 VTE - SCDs, IV  heparin FEN - IVF, NPO, TF Foley - in place  We will sign off. Please call with questions or concerns  LOS: 12 days    Brigid Re , [facility] 08/26/2018, 9:45 AM Pager: [phone] Mon-Fri 7:00 am-4:30 pm Sat-Sun 7:00 am-11:30 am

## 2018-08-26 NOTE — Progress Notes (Signed)
Pharmacy Antibiotic Note  Andrea [name] is a 57 y.o. female admitted on 08/26/2018 with an acute ischemic stroke. She currently trach-dependent and has a PEG. Patient has had increased trach secretions and a slow trend of leukocytosis.  Planning to start empiric antibiotics.  Scr 1.2, eCrCl 60-70 ml/min.   Plan: -Ceftazidime 1g/8h -Vancomycin 2500 mg IV x1 then 1g/12h -Monitor renal fx, cultures, VT at steady state  Height: 5\' 4"  (162.6 cm) Weight: 270 lb 11.6 oz (122.8 kg) IBW/kg (Calculated) : 54.7  Temp (24hrs), Avg:99.5 F (37.5 C), Min:98.5 F (36.9 C), Max:100.7 F (38.2 C)  Recent Labs  Lab 08/22/18 0317 08/23/18 0531 08/24/18 0542 08/15/2018 0646 08/26/18 0406  WBC 10.3 12.9* 14.4* 14.3* 16.1*  CREATININE 1.41* 1.40* 1.22* 1.16* 1.22*      Antimicrobials this admission: 10/22 vancomycin > 10/22 ceftazidime >  Dose adjustments this admission: N/A  Microbiology results: 10/10 mrsa pcr: neg 10/21 TA: reincubated   Harvel Quale 08/26/2018 9:20 AM

## 2018-08-26 NOTE — Progress Notes (Signed)
Patient on trach collar trial and tolerating well at this time. RN at bedside. Will continue to monitor.

## 2018-08-26 NOTE — Progress Notes (Addendum)
NAME:  Andrea [name], MRN:  [id number], DOB:  [birth year], LOS: 32 ADMISSION DATE:  08/07/2018, CONSULTATION DATE:  09/02/2018 REFERRING MD:  Dr. Rory Percy, CHIEF COMPLAINT:  CP/ Acute CVA  Brief History   57 yoF w/poorly controlled HTN and IDDM presenting with CP and left arm pain w/BP 220/117. Initial CTH neg.  Cardiac workup negative for acute event thus far, cardiology following.  Code stroke initiated for R hemiplegia, dysarthria and right facial droop.  Out of window for TPA.  Requiring cleviprex for BP control.  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  Evolving pontine stroke on MRI. Intubated for neuro IR s/p stenting and angioplasty with 80-90% patency.  Extubated post procedure but due to insufficency placed on BiPAP and brought to ICU.    Past Medical History  HTN, IDDM, hypothyroidism s/p ablation  Significant Hospital Events   10/9 present to [facility] ED -transferred to [facility] 10/9 basilar artery stenting 10/9 intubated urgently 6h post procedure for inability to protect airway. 10/10 CTA for declining exam - evolving pontine infarct, stent deemed to be patent 10/14 Brillinta stopped and ASA/ heparin started to in anticipation of tracheostomy on Monday. 10/14 through 10/19: Neuro exam has been stable sodium increasing in spite of free water via tube, providing supportive care awaiting trach 10/20: Sodium down to 151 from 156 clinically looks the same 10/21: Sodium down to 148, sputum culture sent for purulent tracheal secretions, awaiting trach and PEG.  Purulent bronchial secretions from right upper lobe sent for BAL 10/22: Tracheostomy and PEG completed on the 21st tolerated well.  Spiking low-grade fever white cells continued to climb starting empiric vancomycin and ceftaz.  Sodium improved down to 146.  Blood pressure improving but still not at goal added hydrochlorothiazide.  Resumed Brilinta.   Consults: date of consult/date signed off & final recs:  Cards  10/10 Neurology 10/10 PCCM 10/10  Procedures (surgical and bedside):  10/10 Neuro IR >> s/p stent and angioplasty of distal basilar artery  10/10 ETT for procedure 10/10 ETT (reintubated) >> removed 10/21: Size 6 tracheostomy placed by Dr. Nelda Marseille  Significant Diagnostic Tests:  08/12/2018  CTH >>  No acute intracranial abnormalities. Mild white matter changes likely due to small vessel ischemia  08/1018 CTH >> 1. No acute abnormality and no change from earlier today 2. ASPECTS is 10  08/23/2018  CTA head and neck >>  1. 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 No definite acute thrombus identified. 2. Severe stenosis left posterior cerebral artery and moderate stenosis right posterior cerebral artery. 3. Mild stenosis left MCA and moderate stenosis left MCA bifurcation. 4. No significant carotid or vertebral artery stenosis in the neck.  08/21/2018 MRI - evolving pontine infarct.  08/15/18  Echocardiogram - normal LVSF with severe LVH.   Micro Data:  08/31/2018 MRSA PCR >>negative 10/21: Sputum culture: Abundant white blood cells, moderate GPC moderate G PR rare GNR>>>  Antimicrobials:  10/10 cefazolin preop 10/22: Vancomycin>>> 10/22: Ceftaz>> Subjective:  Denies discomfort, feels better after having endotracheal tube removed  Objective   Blood pressure 137/69, pulse 80, temperature (Abnormal) 100.7 F (38.2 C), temperature source Axillary, resp. rate (Abnormal) 30, height _0  (1.626 m), weight 122.8 kg, last menstrual period 07/26/2018, SpO2 100 %.    Vent Mode: PRVC FiO2 (%):  [30 %-60 %] 40 % Set Rate:  [15 bmp] 15 bmp Vt Set:  [430 mL] 430 mL PEEP:  [  [place] Pressure:  [12 cmH20-15 cmH20] 15 cmH20   Intake/Output Summary (Last 24 hours) at 08/26/2018 0834 Last data filed at 08/26/2018 0700 Gross per 24 hour  Intake 1868.9 ml  Output 2010 ml  Net -141.1 ml   Filed Weights    08/23/18 0300 08/24/18 0500 08/22/2018 0000  Weight: 121.2 kg 123.4 kg 122.8 kg    Examination: 57 year old African-American female currently awake and interactive on full ventilator support HEENT size 6 cuffed Shiley tracheostomy unremarkable his membranes are moist equal and reactive Pulmonary: Coarse scattered rhonchi no accessory use Cardiac: Regular rate and rhythm Abdomen soft, nontender.  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   Swallowing dysfunction is not expected to improve sufficiently to permit extubation so tracheostomy and PEG tube are planned for Monday 10/21 Plan Cont asa, resume Brilinta 58m/d Anticipate will need SNF  Respiratory Insufficiency due to inability to protect airway from dysarthria.  Expected with location of stroke.  Portable chest x-ray status post tracheostomy personally reviewed  From 10/21:, Tracheostomy is in satisfactory position, cardiomegaly, left basilar airspace disease, Plan Pressure support and aerosol trach collar trials as tolerated with nocturnal mandatory rest mode VAP intervention Follow-up sputum cultures Antibiotics per below  Increased tracheal secretions, nasal discharge, and leukocytosis -Worrisome for sinusitis and for tracheobronchitis: BAL sent from right upper lobe on 10/21 Plan Start empiric vancomycin and ceftazidime  Hypertensive Crisis Blood pressure much improved following tracheostomy Plan  Continue Coreg, hydralazine and Norvasc. Add low-dose HCTZ  Type 2 diabetes Plan Sliding  scale insulin with scheduled Lantus: Change this to 18 units twice daily  Hypernatremia, with increased creatinine. Consistent with volume contraction.  Creatinine about the same.  Sodium 156, now down to 146 over the last 4 days  plan Continue free water at 50 cc an hour  Disposition / Summary of Today's Plan 08/26/18    Will work on pressure support and transitioning to aerosol trach collar today  Best Practice  Diet: TF Pain/Anxiety/Delirium protocol (if indicated): RASS goal 0 effective 10/22 VAP protocol (if indicated): Ordered DVT prophylaxis: SCDs  GI prophylaxis: Famotidine Hyperglycemia protocol: SSI, lantus Mobility: bedrest Code Status: Full  Family Communication: Sister updated at bedside 10/21  Labs ( personally  Reviewed)  CBC: Recent Labs  Lab 08/22/18 0317 08/23/18 0531 08/24/18 0542 08/11/2018 0646 08/26/18 0406  WBC 10.3 12.9* 14.4* 14.3* 16.1*  NEUTROABS 6.9  --   --   --   --   HGB 8.7* 8.7* 8.8* 8.2* 8.2*  HCT 30.7* 31.5* 31.1* 29.5* 28.7*  MCV 83.0 82.5 81.8 81.9 81.8  PLT 462* 541* 536* 558* 506*    Basic Metabolic Panel: Recent Labs  Lab 08/22/18 0317 08/23/18 0531 08/24/18 0542 08/11/2018 0646 08/26/18 0406  NA 155* 156* 151* 148* 146*  K 3.8 3.6 3.7 3.6 3.7  CL 123* 122* 118* 115* 114*  CO2 _0 GLUCOSE 137* 146* 209* 174* 217*  BUN 52* 52* 44* 38* 39*  CREATININE 1.41* 1.40* 1.22* 1.16* 1.22*  CALCIUM 12.5* 12.4* 12.0* 11.7* 11.6*  GFR: Estimated Creatinine Clearance: 65.8 mL/min (A) (by C-G formula based on SCr of 1.22 mg/dL (H)). Recent Labs  Lab 08/23/18 0531 08/24/18 0542 08/10/2018 0646 08/26/18 0406  WBC 12.9* 14.4* 14.3* 16.1*    Liver Function Tests: No results for input(s): AST, ALT, ALKPHOS, BILITOT, PROT, ALBUMIN in the last 168 hours. No results for input(s): LIPASE, AMYLASE in the last 168 hours. No results for input(s): AMMONIA in the last 168 hours.  ABG    Component Value Date/Time   PHART 7.334  (L) 08/16/2018 2259   PCO2ART 41.9 08/11/2018 2259   PO2ART 275.0 (H) 08/31/2018 2259   HCO3 22.3 08/12/2018 2259   TCO2 24 08/24/2018 2259   ACIDBASEDEF 3.0 (H) 08/21/2018 2259   O2SAT 100.0 08/24/2018 2259     Coagulation Profile: Recent Labs  Lab 08/19/2018 0646  INR 1.20    Cardiac Enzymes: No results for input(s): CKTOTAL, CKMB, CKMBINDEX, TROPONINI in the last 168 hours.  HbA1C: Hgb A1c MFr Bld  Date/Time Value Ref Range Status  08/15/2018 05:12 AM 11.4 (H) 4.8 - 5.6 % Final    Comment:    (NOTE) Pre diabetes:          5.7%-6.4% Diabetes:              >6.4% Glycemic control for   <7.0% adults with diabetes   01/12/2017 02:43 AM 12.8 (H) 4.8 - 5.6 % Final    Comment:    (NOTE)         Pre-diabetes: 5.7 - 6.4         Diabetes: >6.4         Glycemic control for adults with diabetes: <7.0     CBG: Recent Labs  Lab 08/05/2018 1645 08/29/2018 2007 08/27/2018 2336 08/26/18 0356 08/26/18 0816  GLUCAP 134* 130* 161* 190* [place] ACNP-BC [place] Pager # [phone] OR # [phone] if no answer  Attending Note:  57 year old female s/p acute CVA that was trached but uninteractive.  On exam, continues to not follow commands.  I reviewed CXR myself, trach is in a good position.  Discussed with PCCM-NP.  Will begin attempts for PS.  BP control as ordered.  Cancel SLP.  PT as able.  Titrate O2 for sat of 88-92%.  PCCM will continue to follow for trach/vent care.  The patient is critically ill with multiple organ systems failure and requires high complexity decision making for assessment and support, frequent evaluation and titration of therapies, application of advanced monitoring technologies and extensive interpretation of multiple databases.   Critical Care Time devoted to patient care services described in this note is  35  Minutes. This time reflects time of care of this signee Dr Jennet Maduro. This critical care time does not reflect  procedure time, or teaching time or supervisory time of PA/NP/Med student/Med Resident etc but could involve care discussion time.  Rush Farmer, M.D. [facility] Pulmonary/Critical Care Medicine. Pager: [phone]. After hours pager: [phone].

## 2018-08-26 NOTE — Progress Notes (Signed)
Occupational Therapy Treatment Patient Details Name: Andrea [name] MRN: [id number] DOB: June 23, 1961 Today's Date: 08/26/2018    History of present illness 57 yo presented to ED with chest pain noted Rt hemiparesis in ED with acute Left paramedian brainstem infarct. Intubated 10/10 for angioplasty, extubated post procedure and reintubated. Repeat MRI with brainstem infarct extension and bil cerebellar infarcts. Peg/trach 08/15/2018.PMHx: HTN, DM, CKD   OT comments  Respiratory transitioned pt from vent to TC at beginning of session. Session completed on  TC 40% FiO2 8L with VSS throughout. Pt moved into chair position using egress feature. Facilitated trunk and head extension and anterior/posterior weight shifts with total A. Hand over hand with LUE to complete oral care. Pt moving LUE in flexor synergy pattern with increased proximal movement. No active movement observed RUE. Increased ability to visually scan environment. Family present toward end of session. Pt following commands. Pt communicating appropriately with head nods. Continue to recommend rehab at [facility]. Pt appears very motivated to work with therapy. Will continue to follow acutely. Discussed  Recommendation of using Vital Go Total Lift bed with nursing - will discuss with MD.   Follow Up Recommendations  LTACH    Equipment Recommendations  Other (comment)(TBA)    Recommendations for Other Services      Precautions / Restrictions Precautions Precautions: Fall Precaution Comments: Peg/trach       Mobility Bed Mobility Overal bed mobility: Needs Assistance             General bed mobility comments: total A +2 for moving up in bed  Transfers                 General transfer comment: not attempted    Balance     Sitting balance-Leahy Scale: Zero Sitting balance - Comments: chiar position and Egress feature used to work on head and postural control. Pt able to actively extend head against gravity. Total  A to achieve upright midline positionoing. Appears to pull using LUe against therapist.   Pt with complaints of dizziness initially; able to communicate that dizziness improves after sitting  - unsure if due to cerebellar CVA; VSS                                   ADL either performed or assessed with clinical judgement   ADL       Grooming: Total assistance Grooming Details (indicate cue type and reason): hand over hand for oral care with mouth swab; pt appropriately opening mouth for swab; ablet to pucker lips around yonker to spit                               General ADL Comments: total A at this time     Vision   Vision Assessment?: Vision impaired- to be further tested in functional context Additional Comments: Increased visual scanning noted; improved visual attention; unsure if eye movemetn increases dizziness; wil further assess; eyes open entire session   Perception     Praxis      Cognition Arousal/Alertness: Awake/alert Behavior During Therapy: WFL for tasks assessed/performed Overall Cognitive Status: Difficult to assess                     Current Attention Level: Sustained   Following Commands: Follows one step commands consistently       General  Comments: Pt following all commands; smiling when family enterred room; will further assess        Exercises Exercises: Other exercises;General Upper Extremity General Exercises - Upper Extremity Shoulder Flexion: PROM;Both;10 reps;Supine Shoulder ABduction: PROM;Both;10 reps;Supine Shoulder ADduction: PROM;Both;10 reps;Supine Elbow Flexion: PROM;Both;10 reps;Supine Elbow Extension: PROM;Both;10 reps;Supine Wrist Flexion: PROM;Both;10 reps;Supine Wrist Extension: PROM;Both;10 reps;Supine Digit Composite Flexion: PROM;Both;5 reps;Supine Composite Extension: PROM;Both;5 reps;Supine Chair Push Up: PROM;Both;5 reps Other Exercises Other Exercises: L UE facilitation of  movement; delay in movemetn but demonstrating flexor synergy pattern   Shoulder Instructions       General Comments      Pertinent Vitals/ Pain       Pain Assessment: Faces Faces Pain Scale: Hurts little more Pain Location: with neck movement and B shoulder flexion above 80 degrees Pain Descriptors / Indicators: Discomfort;Grimacing Pain Intervention(s): Limited activity within patient's tolerance  Home Living                                          Prior Functioning/Environment              Frequency  Min 2X/week        Progress Toward Goals  OT Goals(current goals can now be found in the care plan section)  Progress towards OT goals: Progressing toward goals  Acute Rehab OT Goals Patient Stated Goal: per family for pt to get stronger OT Goal Formulation: With patient/family Time For Goal Achievement: 08/30/2018 Potential to Achieve Goals: Fair ADL Goals Pt Will Perform Grooming: with max assist;bed level Pt Will Perform Upper Body Bathing: with max assist;bed level Pt/caregiver will Perform Home Exercise Program: Increased ROM;With written HEP provided;Both right and left upper extremity Additional ADL Goal #1: Pt will sit EOB with +2 Max A x 10 min in preparation for functional activities.  Plan Discharge plan remains appropriate    Co-evaluation                 AM-PAC PT "6 Clicks" Daily Activity     Outcome Measure   Help from another person eating meals?: Total Help from another person taking care of personal grooming?: Total Help from another person toileting, which includes using toliet, bedpan, or urinal?: Total Help from another person bathing (including washing, rinsing, drying)?: Total Help from another person to put on and taking off regular upper body clothing?: Total Help from another person to put on and taking off regular lower body clothing?: Total 6 Click Score: 6    End of Session Equipment Utilized During  Treatment: Oxygen(40%FiO2 8L)  OT Visit Diagnosis: Other abnormalities of gait and mobility (R26.89);Muscle weakness (generalized) (M62.81);Low vision, both eyes (H54.2);Feeding difficulties (R63.3);Other symptoms and signs involving cognitive function;Hemiplegia and hemiparesis;Pain Hemiplegia - Right/Left: Right Hemiplegia - dominant/non-dominant: Non-Dominant Hemiplegia - caused by: Nontraumatic intracerebral hemorrhage;Cerebral infarction Pain - part of body: Shoulder(neck/trach site)   Activity Tolerance Patient tolerated treatment well   Patient Left in bed;with call bell/phone within reach;with family/visitor present;Other (comment)(chair position)   Nurse Communication Mobility status;Other (comment)(Vital Go TLB; positionoing BUE to reduce risk of subluxation)        Time: 1610-1650 OT Time Calculation (min): 40 min  Charges: OT General Charges $OT Visit: 1 Visit OT Treatments $Self Care/Home Management : 8-22 mins $Therapeutic Activity: 8-22 mins $Neuromuscular Re-education: 8-22 mins  Maurie Boettcher, OT/L   Acute OT Clinical Specialist Acute Rehabilitation Services Pager  [phone] Office [phone]    Jordi Kamm,HILLARY 08/26/2018, 6:10 PM

## 2018-08-26 NOTE — Progress Notes (Signed)
STROKE TEAM PROGRESS NOTE   SUBJECTIVE (INTERVAL HISTORY) Her RN and speech therapist are at bedside. She is awake alert and eyes open. Had trach and PEG yesterday, tolerating well. Still on vent, may consider weaning today. Na 146 today.     OBJECTIVE Vitals:   08/26/18 0600 08/26/18 0700 08/26/18 0800 08/26/18 0857  BP: 139/75 137/69 136/72 136/72  Pulse: 78 80 82 75  Resp: (!) 27 (!) 30 (!) 26 (!) 24  Temp:   (!) 100.7 F (38.2 C)   TempSrc:   Axillary   SpO2: 100% 100% 100% 100%  Weight:      Height:        CBC:  Recent Labs  Lab 08/22/18 0317  08/06/2018 0646 08/26/18 0406  WBC 10.3   < > 14.3* 16.1*  NEUTROABS 6.9  --   --   --   HGB 8.7*   < > 8.2* 8.2*  HCT 30.7*   < > 29.5* 28.7*  MCV 83.0   < > 81.9 81.8  PLT 462*   < > 558* 506*   < > = values in this interval not displayed.    Basic Metabolic Panel:  Recent Labs  Lab 08/21/2018 0646 08/26/18 0406  NA 148* 146*  K 3.6 3.7  CL 115* 114*  CO2 26 25  GLUCOSE 174* 217*  BUN 38* 39*  CREATININE 1.16* 1.22*  CALCIUM 11.7* 11.6*    Lipid Panel:     Component Value Date/Time   CHOL 256 (H) 08/15/2018 0512   TRIG 248 (H) 08/15/2018 0512   HDL 35 (L) 08/15/2018 0512   CHOLHDL 7.3 08/15/2018 0512   VLDL 50 (H) 08/15/2018 0512   LDLCALC 171 (H) 08/15/2018 0512   HgbA1c:  Lab Results  Component Value Date   HGBA1C 11.4 (H) 08/15/2018   Urine Drug Screen:     Component Value Date/Time   LABOPIA NONE DETECTED 08/20/2018 1836   COCAINSCRNUR NONE DETECTED 08/22/2018 1836   LABBENZ NONE DETECTED 08/10/2018 1836   AMPHETMU NONE DETECTED 08/23/2018 1836   THCU NONE DETECTED 09/01/2018 1836   LABBARB NONE DETECTED 08/26/2018 1836    Alcohol Level No results found for: ETH  IMAGING  Ct Angio Head W Or Wo Contrast Ct Angio Neck W Or Wo Contrast 08/15/2018 IMPRESSION:  1. Interval basilar to left proximal PCA stenting. The density of the stent walls precludes detection of in stent stenosis; there is a  degree of wasting at the mid basilar segment. There is flow in the left PCA beyond the stent such that there is presumed stent patency.  2. Known lower pontine infarct. There are new small cerebellar infarcts since brain MRI yesterday.  3. Severe left P2 segment stenosis, also seen previously.  4. Moderate atheromatous narrowing in the proximal left MCA.    Ct Angio Head W Or Wo Contrast Ct Angio Neck W Or Wo Contrast 09/01/2018 IMPRESSION:  1. 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 No definite acute thrombus identified.  2. Severe stenosis left posterior cerebral artery and moderate stenosis right posterior cerebral artery.  3. Mild stenosis left MCA and moderate stenosis left MCA bifurcation.  4. No significant carotid or vertebral artery stenosis in the neck.    Ct Head Wo Contrast 08/16/2018 IMPRESSION:  No acute intracranial abnormalities. Mild white matter changes likely due to small vessel ischemia.    Mr Brain Wo Contrast 08/31/2018 IMPRESSION:  1. Acute nonhemorrhagic  infarct involving the left paramedian brainstem. The infarct crosses midline.  2. Other periventricular and subcortical white matter disease is moderately advanced for age. This likely reflects the sequela of chronic microvascular ischemia.  3. Tapering of the dens with prominent soft tissue pannus. 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    Ct Head Code Stroke Wo Contrast 08/29/2018  IMPRESSION:  1. No acute abnormality and no change from earlier today  2. ASPECTS is 10 3.    Cerebral Angiogram 08/07/2018 S/P 4 vessel cerebral arteriogram RT CFA approach. 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 S/P stent assisted angioplasty of distal basilar artery with patency of 80 to 90 %. 2.Approx 70 % stenosis of LT ICA supraclinoid  seg   MRI 08/15/18 1. Progressive acute pontine infarct. New patchy bilateral cerebellar infarction. New tiny left thalamic infarcts. 2. Preserved flow void in the stented basilar. 3. left facial/submandibular edema, please correlate with neck exam. Edited result: IMPRESSION: 1. Progressive acute pontine infarct. New patchy bilateral cerebellar infarction. New tiny left thalamic infarcts. 2. Preserved flow void in the stented basilar.  TTE - Left ventricle: The cavity size was normal. There was severe   concentric hypertrophy. Systolic function was normal. The   estimated ejection fraction was in the range of 60% to 65%. Wall   motion was normal; there were no regional wall motion   abnormalities. Doppler parameters are consistent with abnormal   left ventricular relaxation (grade 1 diastolic dysfunction). The   E/e&' ratio is <8, suggesting normal LV filling pressure. - Aortic valve: Trileaflet; mildly calcified leaflets.   Transvalvular velocity was minimally increased. There was no   regurgitation. Mean gradient (S): 12 mm Hg. - Mitral valve: Mildly thickened leaflets . There was trivial   regurgitation. - Left atrium: The atrium was normal in size. - Inferior vena cava: The vessel was dilated. The respirophasic   diameter changes were blunted (< 50%), consistent with elevated   central venous pressure. Impressions: - LVEF 60-65%, severe LVH, normal wall motion, grade 1 DD, normal   LV filling pressure, minimally increased aortic velocity without   signficant stenosis, trivial MR, normal LA size, dilated IVC.  Dg Chest Port 1 View  Result Date: 08/23/2018 CLINICAL DATA:  Encounter for fever. EXAM: PORTABLE CHEST 1 VIEW COMPARISON:  08/19/2018 FINDINGS: Endotracheal tube is 3.6 cm above the carina. Airspace densities in the right hilum and right lower lung have markedly decreased and possibly resolved. There may be a small amount of residual disease in the medial right lower  lung. Vascular crowding in the hilar regions. Low lung volumes. Heart size is grossly stable and within normal limits. Nasogastric tube extends into the abdomen. Negative for pneumothorax. IMPRESSION: Airspace disease in the right lung has markedly decreased. Low lung volumes. Support apparatuses as described. Electronically Signed   By: Markus Daft M.D.   On: 08/23/2018 11:07    PHYSICAL EXAM  Temp:  [98.5 F (36.9 C)-100.7 F (38.2 C)] 100.7 F (38.2 C) (10/22 0800) Pulse Rate:  [66-83] 75 (10/22 0857) Resp:  [19-30] 24 (10/22 0857) BP: (105-145)/(63-88) 136/72 (10/22 0857) SpO2:  [97 %-100 %] 100 % (10/22 0857) FiO2 (%):  [30 %-60 %] 40 % (10/22 0857)  General -  Obese middle-aged African-American lady, not in distress.    Ophthalmologic - fundi not visualized due to noncooperation.  Cardiovascular - Regular rate and rhythm.  Neuro - post trach, still on vent, not on sedation. Eyes open, awake, alert. No ptosis or EOMI, denies diplopia. PERRL. Right gaze nystagmus is improved but still persistent. Bilateral facial weakness, R>L, corneal, gag and cough present. Blinking to visual threat bilaterally. Able to open mouth slightly but not able to protrude tongue. Quadriplegia, with left LE 1/5 on pain stimulation. DTR diminished. Sensation not cooperative, coordination and gait not tested.   ASSESSMENT/PLAN Andrea [name] is a 57 y.o. female with history of difficult to control hypertension, insulin-dependent diabetes, obesity, hypothyroidism status post ablation presenting with chest discomfort, right-sided weakness, slurred speech and right facial droop. She did not receive IV t-PA due to late presentation. S/P stent assisted angioplasty of distal basilar artery.  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 Worsening symptoms with extension of pontine/medullary infarcts with new small bilateral cerebellar infarcts without evidence of stent  re-stenosis or occlusion  Resultant b// UE and LE weakness, eyes minimally disconjugate but improving, nystagmus  CT head - No acute intracranial abnormalities.  CTA H&N 08/15/2018 - Extensive atherosclerotic disease in the basilar with focal critical stenosis in the mid to distal basilar. No definite acute thrombus identified. Severe stenosis left posterior cerebral artery  MRI head - Acute nonhemorrhagic infarct involving the left paramedian brainstem.   CTA H&N 08/15/2018 - stent too dense to see BA lumen, but distal flow preserved. New small cerebellar infarcts since brain MRI yesterday.  Severe left P2 segment stenosis, also seen previously.   Repeat MRI - extension of b/l pontine and upper medullary infarcts with new b/l cerebellar infarcts.  2D Echo - EF 60-65%  LDL - 174  HgbA1c - 11.1  P2Y12 = 35  VTE prophylaxis - heparin subq  Diet - NPO  aspirin 81 mg daily prior to admission, now resumed on Brilinta 90 mg bid and ASA 81 mg daily.   Patient counseled to be compliant with her antithrombotic medications  Ongoing aggressive stroke risk factor management  Therapy recommendations:  LTACH  Disposition:  Pending  Intracranial stenosis  BA mid to distal severe stenosis s/p BA stenting  Left PCA severe stenosis, R PCA moderate stenosis  Left MCA moderate stenosis  Uncontrolled stroke risk factors with HLD, HTN, DM  Non compliance with meds at home  Respiratory failure  S/p trach 08/23/2018  CCM on board  Still on vent  Weaning off vent as able   Hypertension  Stable   BP goal 130-150   On po norvasc, losartan  Metoprolol changed to Coreg 25 bid  On HCTZ 25mg  now  Hyperlipidemia  Lipid lowering medication PTA:  Lipitor 20 mg daily  LDL 174, goal < 70  Increase to 80 mg daily  Continue statin at discharge  Diabetes  HgbA1c 11.4, goal < 7.0  Uncontrolled  Hyperglycemia improved  Lantus 15U bid  Continue basal NovoLog 4U  Q4h  SSI  CBG monitoring  DM coordinator on board  Low grade fever with leukocytosis  Tmax 100.7 -> afebrile->100.7  Leukocytosis - 12.7->12.9->14.4->14.3->16.1  UA neg for UTI  CXR unremarkable, improving from prior  Sputum culture pending  On vanco and rocephin for empiric treatment  CCM on board  Hypernatremia and elevated Cre/BUN  Na 156->156->151->148->146  Cre 1.41->1.40->1.22->1.16->1.22  On D5 @ 50   on free water 300 Q4  On TF @ 55  Continue BMP monitoring  Dysphagia s/p PEG  Continue tube feeding 55 cc/h  IV fluids 50 cc/h  PO fluid 300 Q4h  PEG done 08/14/2018  Anemia   Hb 11.1->9.7->9.4->9.3->9.4->8.8->8.2  Likely due to iron deficiency and acute blood loss  Iron panel showed iron deficiency  Put on iron pills  Other Stroke Risk Factors  Obesity, Body mass index is 46.47 kg/m., recommend weight loss, diet and exercise as appropriate   Other Active Problems  Hypokalemia - supplement  Hospital day # 12  This patient is critically ill due to worsening brainstem infarct, BA stenosis s/p stenting, hyperglycemia, hypertensive, fever and hypernatremia and at significant risk of neurological worsening, death form recurrent stroke, hemorrhagic conversion, CHF, seizure, aspiration. This patient's care requires constant monitoring of vital signs, hemodynamics, respiratory and cardiac monitoring, review of multiple databases, neurological assessment, discussion with family, other specialists and medical decision making of high complexity. I spent 35 minutes of neurocritical care time in the care of this patient.  Andrea Hawking, MD PhD Stroke Neurology 08/26/2018 10:58 AM     To contact Stroke Continuity provider, please refer to [email or web address] After hours, contact General Neurology

## 2018-08-27 ENCOUNTER — Encounter (HOSPITAL_COMMUNITY): Payer: Self-pay | Admitting: General Surgery

## 2018-08-27 LAB — BASIC METABOLIC PANEL WITH GFR
Anion gap: 9 (ref 5–15)
BUN: 39 mg/dL — ABNORMAL HIGH (ref 6–20)
CO2: 23 mmol/L (ref 22–32)
Calcium: 11.2 mg/dL — ABNORMAL HIGH (ref 8.9–10.3)
Chloride: 109 mmol/L (ref 98–111)
Creatinine, Ser: 1.18 mg/dL — ABNORMAL HIGH (ref 0.44–1.00)
GFR calc Af Amer: 58 mL/min — ABNORMAL LOW
GFR calc non Af Amer: 50 mL/min — ABNORMAL LOW
Glucose, Bld: 253 mg/dL — ABNORMAL HIGH (ref 70–99)
Potassium: 3.7 mmol/L (ref 3.5–5.1)
Sodium: 141 mmol/L (ref 135–145)

## 2018-08-27 LAB — CULTURE, RESPIRATORY W GRAM STAIN: Culture: NORMAL

## 2018-08-27 LAB — GLUCOSE, CAPILLARY
GLUCOSE-CAPILLARY: 192 mg/dL — AB (ref 70–99)
Glucose-Capillary: 225 mg/dL — ABNORMAL HIGH (ref 70–99)
Glucose-Capillary: 234 mg/dL — ABNORMAL HIGH (ref 70–99)
Glucose-Capillary: 298 mg/dL — ABNORMAL HIGH (ref 70–99)
Glucose-Capillary: 241 mg/dL — ABNORMAL HIGH (ref 70–99)

## 2018-08-27 LAB — CULTURE, RESPIRATORY

## 2018-08-27 LAB — CBC
HEMATOCRIT: 27.9 % — AB (ref 36.0–46.0)
Hemoglobin: 7.9 g/dL — ABNORMAL LOW (ref 12.0–15.0)
MCH: 23.2 pg — ABNORMAL LOW (ref 26.0–34.0)
MCHC: 28.3 g/dL — AB (ref 30.0–36.0)
MCV: 82.1 fL (ref 80.0–100.0)
Platelets: 460 10*3/uL — ABNORMAL HIGH (ref 150–400)
RBC: 3.4 MIL/uL — AB (ref 3.87–5.11)
RDW: 15.4 % (ref 11.5–15.5)
WBC: 20.6 10*3/uL — ABNORMAL HIGH (ref 4.0–10.5)
nRBC: 0.2 % (ref 0.0–0.2)

## 2018-08-27 MED ORDER — PRO-STAT SUGAR FREE PO LIQD
60.0000 mL | Freq: Two times a day (BID) | ORAL | Status: DC
  Administered 2018-08-27 – 2018-09-25 (×57): 60 mL
  Filled 2018-08-27 (×57): qty 60

## 2018-08-27 MED ORDER — CHLORHEXIDINE GLUCONATE 0.12 % MT SOLN
15.0000 mL | Freq: Two times a day (BID) | OROMUCOSAL | Status: DC
  Administered 2018-08-27 – 2018-09-01 (×11): 15 mL via OROMUCOSAL
  Filled 2018-08-27 (×9): qty 15

## 2018-08-27 MED ORDER — ORAL CARE MOUTH RINSE
15.0000 mL | Freq: Two times a day (BID) | OROMUCOSAL | Status: DC
  Administered 2018-08-27 – 2018-09-01 (×11): 15 mL via OROMUCOSAL

## 2018-08-27 MED ORDER — INSULIN ASPART 100 UNIT/ML ~~LOC~~ SOLN
8.0000 [IU] | SUBCUTANEOUS | Status: DC
  Administered 2018-08-27 – 2018-08-28 (×6): 8 [IU] via SUBCUTANEOUS

## 2018-08-27 MED ORDER — ZINC OXIDE 40 % EX OINT
TOPICAL_OINTMENT | Freq: Four times a day (QID) | CUTANEOUS | Status: DC
  Administered 2018-08-27 – 2018-08-28 (×7): via TOPICAL
  Administered 2018-08-28 – 2018-08-29 (×2): 1 via TOPICAL
  Administered 2018-08-29 – 2018-09-01 (×12): via TOPICAL
  Administered 2018-09-01: 1 via TOPICAL
  Administered 2018-09-01: 15:00:00 via TOPICAL
  Administered 2018-09-02: 1 via TOPICAL
  Administered 2018-09-02: 14:00:00 via TOPICAL
  Administered 2018-09-03: 1 via TOPICAL
  Administered 2018-09-03 (×3): via TOPICAL
  Administered 2018-09-04: 1 via TOPICAL
  Administered 2018-09-04 – 2018-09-05 (×4): via TOPICAL
  Administered 2018-09-05 – 2018-09-06 (×4): 1 via TOPICAL
  Administered 2018-09-06 – 2018-09-07 (×6): via TOPICAL
  Administered 2018-09-08 (×2): 1 via TOPICAL
  Administered 2018-09-08 – 2018-09-09 (×4): via TOPICAL
  Administered 2018-09-09: 1 via TOPICAL
  Administered 2018-09-09 – 2018-09-10 (×2): via TOPICAL
  Administered 2018-09-10: 1 via TOPICAL
  Administered 2018-09-10: 11:00:00 via TOPICAL
  Administered 2018-09-10 – 2018-09-11 (×2): 1 via TOPICAL
  Administered 2018-09-11: 23:00:00 via TOPICAL
  Administered 2018-09-11 – 2018-09-13 (×8): 1 via TOPICAL
  Administered 2018-09-13: 22:00:00 via TOPICAL
  Administered 2018-09-13 – 2018-09-14 (×4): 1 via TOPICAL
  Administered 2018-09-14 – 2018-09-15 (×2): via TOPICAL
  Administered 2018-09-15: 1 via TOPICAL
  Administered 2018-09-15: 14:00:00 via TOPICAL
  Administered 2018-09-15: 1 via TOPICAL
  Administered 2018-09-16 – 2018-09-19 (×14): via TOPICAL
  Administered 2018-09-19: 1 via TOPICAL
  Administered 2018-09-20 – 2018-09-22 (×12): via TOPICAL
  Administered 2018-09-23: 1 via TOPICAL
  Administered 2018-09-23 – 2018-10-05 (×39): via TOPICAL
  Administered 2018-10-05: 1 via TOPICAL
  Administered 2018-10-05 – 2018-10-08 (×12): via TOPICAL
  Administered 2018-10-09: 1 via TOPICAL
  Administered 2018-10-09 – 2018-10-14 (×19): via TOPICAL
  Administered 2018-10-15 (×2): 1 via TOPICAL
  Administered 2018-10-15: 17:00:00 via TOPICAL
  Administered 2018-10-15: 1 via TOPICAL
  Administered 2018-10-16 – 2018-10-18 (×11): via TOPICAL
  Administered 2018-10-19: 1 via TOPICAL
  Administered 2018-10-19 – 2018-10-22 (×12): via TOPICAL
  Administered 2018-10-23: 1 via TOPICAL
  Administered 2018-10-23 – 2018-10-25 (×11): via TOPICAL
  Administered 2018-10-26 (×2): 1 via TOPICAL
  Administered 2018-10-26: 23:00:00 via TOPICAL
  Administered 2018-10-26: 1 via TOPICAL
  Administered 2018-10-27 – 2018-10-29 (×13): via TOPICAL
  Administered 2018-10-30: 1 via TOPICAL
  Administered 2018-10-30: 22:00:00 via TOPICAL
  Administered 2018-10-30: 1 via TOPICAL
  Administered 2018-10-31 – 2018-11-13 (×50): via TOPICAL
  Filled 2018-08-27 (×11): qty 57

## 2018-08-27 MED ORDER — GLUCERNA 1.2 CAL PO LIQD
1000.0000 mL | ORAL | Status: DC
  Administered 2018-08-27 – 2018-09-20 (×16): 1000 mL
  Filled 2018-08-27 (×38): qty 1000

## 2018-08-27 NOTE — Progress Notes (Signed)
Nutrition Follow-up  DOCUMENTATION CODES:   Morbid obesity  INTERVENTION:   D/C Vital High Protein  Glucerna 1.2 @ 45 ml/hr (1080 ml/ay) via PEG 60 ml Prostat BID  Provides: 1696 kcal, 124 grams protein, and 875 ml free water.  Total free water: 2675 ml  Total carbohydrate: 163 grams  NUTRITION DIAGNOSIS:   Inadequate oral intake related to inability to eat as evidenced by NPO status. Ongoing  GOAL:   Patient will meet greater than or equal to 90% of their needs Met.   MONITOR:   Vent status, TF tolerance, I & O's, Labs  ASSESSMENT:   Andrea [name] is a 57 yo female with PMH of type 2 diabetes, HTN, CKD III, obesity, and anemia admitted for chest pain. Code Stroke called 10/10 in AM. 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 Intubated 10/10 in ICU.   Pt discussed during ICU rounds and with RN.  Pt on trach collar since 10/22, cuff deflated this am with SLP.  Blood sugar control remains an issue  Medications reviewed and include: colace, ferrous sulfate TID, SSI, 8 units novolog every 4 hours, lantus 18 units BID, MVI 300 ml free water every 4 hours D5 @ 25 ml/hr Labs reviewed: Na 141 - per RN plan to d/c IVF tomorrow CBG (last 3)  Recent Labs    08/27/18 0331 08/27/18 0811 08/27/18 1157  GLUCAP 225* 234* 298*   Lab Results  Component Value Date   HGBA1C 11.4 (H) 08/15/2018   TF:  Vital High Protein @ 55 ml/hr (1320 ml/day) 30 ml Prostat TID Provides: 1520 kcal, 145 grams protein, and 1103 ml free water.  Total free water: 2903 ml    Diet Order:   Diet Order            Diet NPO time specified  Diet effective ____              EDUCATION NEEDS:   Not appropriate for education at this time  Skin:  Skin Assessment: Skin Integrity Issues: Skin Integrity Issues:: Stage II Stage II: vulva due to pure wick  Last BM:  10/21 small  Height:   Ht Readings from Last 1 Encounters:  08/15/18 5' 4"  (1.626 m)    Weight:   Wt Readings from Last 1  Encounters:  08/27/18 124.6 kg    Ideal Body Weight:  54.5 kg  BMI:  Body mass index is 47.15 kg/m.  Estimated Nutritional Needs:   Kcal:  1030-1314  Protein:  120-130 grams  Fluid:  >1.5 L/day  Maylon Peppers RD, LDN, CNSC [phone] Pager [phone] After Hours Pager\

## 2018-08-27 NOTE — Evaluation (Signed)
Speech Language Pathology Evaluation Patient Details Name: Andrea [name] MRN: [id number] DOB: 30-Apr-1961 Today's Date: 08/27/2018 Time: 1045-1100 SLP Time Calculation (min) (ACUTE ONLY): 15 min  Problem List:  Patient Active Problem List   Diagnosis Date Noted  . Acute respiratory failure ([place])   . Tracheostomy present ([place])   . Tracheostomy status ([place])   . Hypertensive urgency 08/27/2018  . CKD (chronic kidney disease), stage III ([place]) 09/02/2018  . Acute ischemic stroke ([place]) 08/24/2018  . Basilar artery stenosis with infarction ([place]) 09/04/2018  . Respiratory insufficiency   . Low back pain 01/29/2017  . Cholelithiasis without cholecystitis 01/18/2017  . Anemia, iron deficiency 01/17/2017  . Abdominal pain   . Hyperglycemia   . Increased anion gap metabolic acidosis   . Weight loss 04/09/2016  . Headache 04/09/2016  . Fibroids 04/14/2014  . Abnormal uterine bleeding (AUB) 04/14/2014  . Unequal leg length 04/16/2013  . Lightheadedness 12/17/2012  . Chest pain 12/14/2012  . Morbid obesity ([place]) 12/29/2011  . High cholesterol   . 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 ANXIETY 02/20/2010  . GERD 01/30/2010  . THYROID NODULE 01/17/2010  . THYROMEGALY 01/16/2010  . DEPRESSION 01/16/2010  . Insulin-requiring or dependent type II diabetes mellitus ([place]) 09/18/2007  . Essential hypertension, benign 09/18/2007   Past Medical History:  Past Medical History:  Diagnosis Date  . Diabetes mellitus   . Environmental allergies   . Fibroid   . High cholesterol   . Hx of cardiovascular stress test    a. ETT-MV 2/14:  EF 46% (normal by echo), low risk, no ischemia or scar  . Hx of echocardiogram    Echo 2/14: mild LVH, EF 55-60%, Gr 1 diast dysfn, mild LAE  . Hypertension   . Hyperthyroidism    Radioactive iodine ablation   Past Surgical History:  Past Surgical History:  Procedure Laterality Date  . [place], 2001   x 2  . ESOPHAGOGASTRODUODENOSCOPY  N/A 08/19/2018   Procedure: ESOPHAGOGASTRODUODENOSCOPY (EGD);  Surgeon: Judeth Horn, MD;  Location: [facility];  Service: General;  Laterality: N/A;  bedside  . IR ANGIO INTRA EXTRACRAN SEL COM CAROTID INNOMINATE BILAT MOD SED  08/05/2018  . IR ANGIO VERTEBRAL SEL SUBCLAVIAN INNOMINATE UNI R MOD SED  08/16/2018  . IR CT HEAD LTD  08/05/2018  . IR INTRA CRAN STENT  08/27/2018  . PEG PLACEMENT N/A 09/03/2018   Procedure: PERCUTANEOUS ENDOSCOPIC GASTROSTOMY (PEG) PLACEMENT;  Surgeon: Judeth Horn, MD;  Location: [facility];  Service: General;  Laterality: N/A;  . RADIOLOGY WITH ANESTHESIA N/A 08/28/2018   Procedure: IR WITH ANESTHESIA;  Surgeon: Luanne Bras, MD;  Location: [facility];  Service: Radiology;  Laterality: N/A;   HPI:  57 yo presented to ED with chest pain noted Rt hemiparesis in ED with acute Left paramedian brainstem infarct. Intubated 10/10 for angioplasty, extubated post procedure and reintubated. Repeat MRI with brainstem infarct extension and bil cerebellar infarcts. Peg/trach 08/11/2018.PMHx: HTN, DM, CKD   Assessment / Plan / Recommendation Clinical Impression  Pt presents with significant cognitive impairments in the setting of new trach with inability to tolerate PMV d/t no upperairway patency. However, pt is able to maintain eye contact with SLP and demonstrate attention to SLP for ~ 5 minute intervals. She was able to open mouth when cued to breath thru her mouth (during PMV placement). Even with Max A gesutral support, pt was not able to follow any other directions or answer yes/no questions (is your name Khali?).  Would recommend skilled ST services to facilitate cognitive ability. Of note, pt also held salvia likely d/t cognitive deficits.     SLP Assessment  SLP Recommendation/Assessment: Patient needs continued Speech Lanaguage Pathology Services SLP Visit Diagnosis: Cognitive communication deficit (R41.841)    Follow Up Recommendations  LTACH    Frequency and  Duration min 2x/week  2 weeks      SLP Evaluation Cognition  Overall Cognitive Status: Difficult to assess Arousal/Alertness: Awake/alert Orientation Level: (Trach with inability to tolerate PMV) Attention: Focused;Sustained Focused Attention: Appears intact Sustained Attention: Impaired Sustained Attention Impairment: Verbal basic;Functional basic       Comprehension  Auditory Comprehension Overall Auditory Comprehension: Impaired Yes/No Questions: Impaired Basic Biographical Questions: 0-25% accurate Commands: Impaired One Step Basic Commands: 0-24% accurate Visual Recognition/Discrimination Discrimination: Not tested Reading Comprehension Reading Status: Not tested    Expression Expression Primary Mode of Expression: (Trach with inability to tolerate PMV) Verbal Expression Overall Verbal Expression: Impaired   Oral / Motor  Oral Motor/Sensory Function Overall Oral Motor/Sensory Function: Severe impairment Motor Speech Overall Motor Speech: Impaired Intelligibility: Unable to assess (comment)(No upper airway patency)   GO                    Andrea [name] 08/27/2018, 1:09 PM

## 2018-08-27 NOTE — Evaluation (Addendum)
[facility] Speaking Valve - Evaluation Patient Details  Name: Andrea [name] MRN: [id number] Date of Birth: [birth year]  Today's Date: 08/27/2018 Time: 1030-1045  SLP Time Calculation (min) (ACUTE ONLY): 15 min  Past Medical History:  Past Medical History:  Diagnosis Date  . Diabetes mellitus   . Environmental allergies   . Fibroid   . High cholesterol   . Hx of cardiovascular stress test    a. ETT-MV 2/14:  EF 46% (normal by echo), low risk, no ischemia or scar  . Hx of echocardiogram    Echo 2/14: mild LVH, EF 55-60%, Gr 1 diast dysfn, mild LAE  . Hypertension   . Hyperthyroidism    Radioactive iodine ablation   Past Surgical History:  Past Surgical History:  Procedure Laterality Date  . [place], 2001   x 2  . ESOPHAGOGASTRODUODENOSCOPY N/A 09/03/2018   Procedure: ESOPHAGOGASTRODUODENOSCOPY (EGD);  Surgeon: Judeth Horn, MD;  Location: [facility];  Service: General;  Laterality: N/A;  bedside  . IR ANGIO INTRA EXTRACRAN SEL COM CAROTID INNOMINATE BILAT MOD SED  09/04/2018  . IR ANGIO VERTEBRAL SEL SUBCLAVIAN INNOMINATE UNI R MOD SED  08/31/2018  . IR CT HEAD LTD  08/28/2018  . IR INTRA CRAN STENT  08/07/2018  . PEG PLACEMENT N/A 08/15/2018   Procedure: PERCUTANEOUS ENDOSCOPIC GASTROSTOMY (PEG) PLACEMENT;  Surgeon: Judeth Horn, MD;  Location: [facility];  Service: General;  Laterality: N/A;  . RADIOLOGY WITH ANESTHESIA N/A 08/26/2018   Procedure: IR WITH ANESTHESIA;  Surgeon: Luanne Bras, MD;  Location: [facility];  Service: Radiology;  Laterality: N/A;   HPI:  57 yo presented to ED with chest pain noted Rt hemiparesis in ED with acute Left paramedian brainstem infarct. Intubated 10/10 for angioplasty, extubated post procedure and reintubated. Repeat MRI with brainstem infarct extension and bil cerebellar infarcts. Peg/trach 08/14/2018.PMHx: HTN, DM, CKD   Assessment / Plan / Recommendation Clinical Impression  Pt presents with inability to tolerate  PMV d/t no upper airway patency. Pt currently has a 54mm cuffed [place]. She tolerated deflation of cuff with no change in vitals and good cough with nursing present to suction tracheal secretions. Despite lots of opportunities and cues to direct air into upper airway, pt was not able to effectively. PMV only left in place for 3 to 4 respiratory cycles d/t inability to move air into upperairway. Would recommend continue trials of cuff deflation, changing to cuffless and/or downsizing trach as able and PMV with Speech Therapy only. Education provided to nursing. ST to follow for increased tolerance.  SLP Visit Diagnosis: Aphonia (R49.1)    SLP Assessment  Patient needs continued Speech Lanaguage Pathology Services    Follow Up Recommendations  LTACH    Frequency and Duration min 2x/week  2 weeks    PMSV Trial PMSV was placed for: 3 breath cycles Able to redirect subglottic air through upper airway: No Able to Attain Phonation: No Able to Expectorate Secretions: No Level of Secretion Expectoration with PMSV: Tracheal Intelligibility: Unable to assess (comment)(No upper airway patency) Respirations During Trial: 30(during trial of cuff deflation) SpO2 During Trial: 100 %(during cuff deflation trial) Pulse During Trial: 68(during cuff deflation trial) Behavior: Alert   Tracheostomy Tube       Vent Dependency  FiO2 (%): 28 %    Cuff Deflation Trial  GO Tolerated Cuff Deflation: Yes Length of Time for Cuff Deflation Trial: 30 Behavior: Alert        Chai Verdejo 08/27/2018, 1:01 PM

## 2018-08-27 NOTE — Progress Notes (Signed)
FPTS Social Note  Patient is resting comfortably in bed with pain again trach.  Appears that patient is doing well with suctioning per nursing staff.  Likely to go to [facility] we will continue following.  FPTS appreciate the great care critical care and neurology are providing for Andrea [name].  [place] Bing, DO 08/27/2018, 6:55 AM PGY-3, [facility] Service pager: [phone]

## 2018-08-27 NOTE — Progress Notes (Signed)
STROKE TEAM PROGRESS NOTE   SUBJECTIVE (INTERVAL HISTORY) Her RN is at bedside. She is awake alert and eyes open. Switched to trach collar yesterday afternoon and tolerating well so far. Still has copious secretions, Tmax 101 yesterday afternoon. On rocephin and vanco. Left UE and LE some improved movement.    OBJECTIVE Vitals:   08/27/18 0812 08/27/18 0902 08/27/18 1200 08/27/18 1222  BP:    130/66  Pulse: 81     Resp: (!) 22   20  Temp:   98.2 F (36.8 C)   TempSrc:   Axillary   SpO2:  98%    Weight:      Height:        CBC:  Recent Labs  Lab 08/22/18 0317  08/26/18 0406 08/27/18 0354  WBC 10.3   < > 16.1* 20.6*  NEUTROABS 6.9  --   --   --   HGB 8.7*   < > 8.2* 7.9*  HCT 30.7*   < > 28.7* 27.9*  MCV 83.0   < > 81.8 82.1  PLT 462*   < > 506* 460*   < > = values in this interval not displayed.    Basic Metabolic Panel:  Recent Labs  Lab 08/26/18 0406 08/27/18 0354  NA 146* 141  K 3.7 3.7  CL 114* 109  CO2 25 23  GLUCOSE 217* 253*  BUN 39* 39*  CREATININE 1.22* 1.18*  CALCIUM 11.6* 11.2*    Lipid Panel:     Component Value Date/Time   CHOL 256 (H) 08/15/2018 0512   TRIG 248 (H) 08/15/2018 0512   HDL 35 (L) 08/15/2018 0512   CHOLHDL 7.3 08/15/2018 0512   VLDL 50 (H) 08/15/2018 0512   LDLCALC 171 (H) 08/15/2018 0512   HgbA1c:  Lab Results  Component Value Date   HGBA1C 11.4 (H) 08/15/2018   Urine Drug Screen:     Component Value Date/Time   LABOPIA NONE DETECTED 08/30/2018 1836   COCAINSCRNUR NONE DETECTED 08/08/2018 1836   LABBENZ NONE DETECTED 08/23/2018 1836   AMPHETMU NONE DETECTED 08/24/2018 1836   THCU NONE DETECTED 08/17/2018 1836   LABBARB NONE DETECTED 09/03/2018 1836    Alcohol Level No results found for: ETH  IMAGING  Ct Angio Head W Or Wo Contrast Ct Angio Neck W Or Wo Contrast 08/15/2018 IMPRESSION:  1. Interval basilar to left proximal PCA stenting. The density of the stent walls precludes detection of in stent stenosis;  there is a degree of wasting at the mid basilar segment. There is flow in the left PCA beyond the stent such that there is presumed stent patency.  2. Known lower pontine infarct. There are new small cerebellar infarcts since brain MRI yesterday.  3. Severe left P2 segment stenosis, also seen previously.  4. Moderate atheromatous narrowing in the proximal left MCA.    Ct Angio Head W Or Wo Contrast Ct Angio Neck W Or Wo Contrast 09/04/2018 IMPRESSION:  1. 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 No definite acute thrombus identified.  2. Severe stenosis left posterior cerebral artery and moderate stenosis right posterior cerebral artery.  3. Mild stenosis left MCA and moderate stenosis left MCA bifurcation.  4. No significant carotid or vertebral artery stenosis in the neck.    Ct Head Wo Contrast 08/26/2018 IMPRESSION:  No acute intracranial abnormalities. Mild white matter changes likely due to small vessel ischemia.    Mr Brain Wo Contrast 08/06/2018  IMPRESSION:  1. Acute nonhemorrhagic infarct involving the left paramedian brainstem. The infarct crosses midline.  2. Other periventricular and subcortical white matter disease is moderately advanced for age. This likely reflects the sequela of chronic microvascular ischemia.  3. Tapering of the dens with prominent soft tissue pannus. 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    Ct Head Code Stroke Wo Contrast 08/30/2018  IMPRESSION:  1. No acute abnormality and no change from earlier today  2. ASPECTS is 10 3.    Cerebral Angiogram 08/06/2018 S/P 4 vessel cerebral arteriogram RT CFA approach. 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 S/P stent assisted angioplasty of distal basilar artery with patency of 80 to 90 %. 2.Approx 70 % stenosis of LT ICA  supraclinoid seg   MRI 08/15/18 1. Progressive acute pontine infarct. New patchy bilateral cerebellar infarction. New tiny left thalamic infarcts. 2. Preserved flow void in the stented basilar. 3. left facial/submandibular edema, please correlate with neck exam. Edited result: IMPRESSION: 1. Progressive acute pontine infarct. New patchy bilateral cerebellar infarction. New tiny left thalamic infarcts. 2. Preserved flow void in the stented basilar.  TTE - Left ventricle: The cavity size was normal. There was severe   concentric hypertrophy. Systolic function was normal. The   estimated ejection fraction was in the range of 60% to 65%. Wall   motion was normal; there were no regional wall motion   abnormalities. Doppler parameters are consistent with abnormal   left ventricular relaxation (grade 1 diastolic dysfunction). The   E/e&' ratio is <8, suggesting normal LV filling pressure. - Aortic valve: Trileaflet; mildly calcified leaflets.   Transvalvular velocity was minimally increased. There was no   regurgitation. Mean gradient (S): 12 mm Hg. - Mitral valve: Mildly thickened leaflets . There was trivial   regurgitation. - Left atrium: The atrium was normal in size. - Inferior vena cava: The vessel was dilated. The respirophasic   diameter changes were blunted (< 50%), consistent with elevated   central venous pressure. Impressions: - LVEF 60-65%, severe LVH, normal wall motion, grade 1 DD, normal   LV filling pressure, minimally increased aortic velocity without   signficant stenosis, trivial MR, normal LA size, dilated IVC.  Dg Chest Port 1 View  Result Date: 08/23/2018 CLINICAL DATA:  Encounter for fever. EXAM: PORTABLE CHEST 1 VIEW COMPARISON:  08/19/2018 FINDINGS: Endotracheal tube is 3.6 cm above the carina. Airspace densities in the right hilum and right lower lung have markedly decreased and possibly resolved. There may be a small amount of residual disease in the medial  right lower lung. Vascular crowding in the hilar regions. Low lung volumes. Heart size is grossly stable and within normal limits. Nasogastric tube extends into the abdomen. Negative for pneumothorax. IMPRESSION: Airspace disease in the right lung has markedly decreased. Low lung volumes. Support apparatuses as described. Electronically Signed   By: Markus Daft M.D.   On: 08/23/2018 11:07    PHYSICAL EXAM  Temp:  [98.2 F (36.8 C)-101 F (38.3 C)] 98.2 F (36.8 C) (10/23 1200) Pulse Rate:  [71-99] 81 (10/23 0812) Resp:  [20-39] 20 (10/23 1222) BP: (114-150)/(63-88) 130/66 (10/23 1222) SpO2:  [98 %-100 %] 98 % (10/23 0902) FiO2 (%):  [28 %-40 %] 28 % (10/23 1231) Weight:  [124.6 kg] 124.6 kg (10/23 0135)  General -  Obese middle-aged African-American lady, not in distress.    Ophthalmologic -  fundi not visualized due to noncooperation.  Cardiovascular - Regular rate and rhythm.  Neuro - post trach, on trach collar, off sedation. Eyes open, awake, alert. No ptosis or EOMI, denies diplopia. PERRL. blinking to visual threat bilaterally. Right gaze nystagmus is improved but still persistent. Bilateral facial weakness, R>L, corneal, gag and cough present. Able to open mouth slightly but not able to protrude tongue. Quadriplegia, with left LE 2-/5 proximal and 0/5 distal. DTR diminished. Sensation not cooperative, coordination and gait not tested.   ASSESSMENT/PLAN Ms. SABRINE PATCHEN is a 57 y.o. female with history of difficult to control hypertension, insulin-dependent diabetes, obesity, hypothyroidism status post ablation presenting with chest discomfort, right-sided weakness, slurred speech and right facial droop. She did not receive IV t-PA due to late presentation. S/P stent assisted angioplasty of distal basilar artery.  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 Worsening symptoms with extension of pontine/medullary infarcts with new small  bilateral cerebellar infarcts without evidence of stent re-stenosis or occlusion  Resultant b// UE and LE weakness, eyes minimally disconjugate but improving, nystagmus  CT head - No acute intracranial abnormalities.  CTA H&N 08/27/2018 - Extensive atherosclerotic disease in the basilar with focal critical stenosis in the mid to distal basilar. No definite acute thrombus identified. Severe stenosis left posterior cerebral artery  MRI head - Acute nonhemorrhagic infarct involving the left paramedian brainstem.   CTA H&N 08/15/2018 - stent too dense to see BA lumen, but distal flow preserved. New small cerebellar infarcts since brain MRI yesterday.  Severe left P2 segment stenosis, also seen previously.   Repeat MRI - extension of b/l pontine and upper medullary infarcts with new b/l cerebellar infarcts.  2D Echo - EF 60-65%  LDL - 174  HgbA1c - 11.1  VTE prophylaxis - heparin subq  aspirin 81 mg daily prior to admission, now resumed on Brilinta 90 mg bid and ASA 81 mg daily.   Patient counseled to be compliant with her antithrombotic medications  Ongoing aggressive stroke risk factor management  Therapy recommendations:  LTACH  Disposition:  Pending  Intracranial stenosis  BA mid to distal severe stenosis s/p BA stenting  Left PCA severe stenosis, R PCA moderate stenosis  Left MCA moderate stenosis  Uncontrolled stroke risk factors with HLD, HTN, DM  Non compliance with meds at home  Respiratory failure  S/p trach 08/24/2018  CCM on board  On trach collar now, tolerating well  Still has copious secretions  Hypertension  Stable   BP goal 130-150   On po norvasc, losartan  Metoprolol changed to Coreg 25 bid  On HCTZ 25mg  now  Hyperlipidemia  Lipid lowering medication PTA:  Lipitor 20 mg daily  LDL 174, goal < 70  Increase to 80 mg daily  Continue statin at discharge  Diabetes  HgbA1c 11.4, goal < 7.0  Uncontrolled  Hyperglycemia continues  with D5 on board  Lantus 18U bid  Continue basal NovoLog 8U Q4h  SSI  CBG monitoring  Adjust insulin once D5 discontinued  Low grade fever with leukocytosis  Tmax 100.7 -> afebrile->100.7->101.0  Leukocytosis - 12.7->12.9->14.4->14.3->16.1->20.6  UA neg for UTI  CXR unremarkable, improving from prior  Sputum culture normal flora  On vanco and rocephin for empiric treatment  CCM on board  Hypernatremia and elevated Cre/BUN  Na 156->156->151->148->146->141  Cre 1.41->1.40->1.22->1.16->1.22->1.18  On D5 @ 25  on free water 300 Q4  On TF @ 55  Continue BMP monitoring  Dysphagia s/p  PEG  Continue tube feeding 55 cc/h  IV fluids 25 cc/h  PO fluid 300 Q4h  PEG done 08/21/2018  Anemia   Hb 11.1->9.7->9.4->9.3->9.4->8.8->8.2->7.9  Likely due to iron deficiency and acute blood loss  Iron panel showed iron deficiency  Put on iron pills  Other Stroke Risk Factors  Obesity, Body mass index is 47.15 kg/m., recommend weight loss, diet and exercise as appropriate   Other Active Problems  Hypokalemia - supplement  Hospital day # 13  This patient is critically ill due to worsening brainstem infarct, BA stenosis s/p stenting, hyperglycemia, hypertensive, fever and hypernatremia and at significant risk of neurological worsening, death form recurrent stroke, hemorrhagic conversion, CHF, seizure, aspiration. This patient's care requires constant monitoring of vital signs, hemodynamics, respiratory and cardiac monitoring, review of multiple databases, neurological assessment, discussion with family, other specialists and medical decision making of high complexity. I spent 35 minutes of neurocritical care time in the care of this patient.  Rosalin Hawking, MD PhD Stroke Neurology 08/27/2018 2:14 PM     To contact Stroke Continuity provider, please refer to [email or web address] After hours, contact General Neurology

## 2018-08-27 NOTE — Progress Notes (Addendum)
NAME:  Andrea [name], MRN:  [id number], DOB:  06/10/1961, LOS: 29 ADMISSION DATE:  08/10/2018, CONSULTATION DATE:  08/15/2018 REFERRING MD:  Dr. Rory Percy, CHIEF COMPLAINT:  CP/ Acute CVA  Brief History   32 yoF w/poorly controlled HTN and IDDM presenting with CP and left arm pain w/BP 220/117. Initial CTH neg.  Cardiac workup negative for acute event thus far, cardiology following.  Code stroke initiated for R hemiplegia, dysarthria and right facial droop.  Out of window for TPA.  Requiring cleviprex for BP control.  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  Evolving pontine stroke on MRI. Intubated for neuro IR s/p stenting and angioplasty with 80-90% patency.  Extubated post procedure but due to insufficency placed on BiPAP and brought to ICU.    Past Medical History  HTN, IDDM, hypothyroidism s/p ablation  Significant Hospital Events   10/9 present to [facility] ED -transferred to [facility] 10/9 basilar artery stenting 10/9 intubated urgently 6h post procedure for inability to protect airway. 10/10 CTA for declining exam - evolving pontine infarct, stent deemed to be patent 10/14 Brillinta stopped and ASA/ heparin started to in anticipation of tracheostomy on Monday. 10/14 through 10/19: Neuro exam has been stable sodium increasing in spite of free water via tube, providing supportive care awaiting trach 10/20: Sodium down to 151 from 156 clinically looks the same 10/21: Sodium down to 148, sputum culture sent for purulent tracheal secretions, awaiting trach and PEG.  Purulent bronchial secretions from right upper lobe sent for BAL 10/22: Tracheostomy and PEG completed on the 21st tolerated well.  Spiking low-grade fever white cells continued to climb starting empiric vancomycin and ceftaz.  Sodium improved down to 146.  Blood pressure improving but still not at goal added hydrochlorothiazide.  Resumed Brilinta.  10/23: Sodium normal.  Backing down on free water.  Glycemic control  challenging, increased basal NovoLog in addition to Lantus and sliding scale.  Still spiking fever, white blood cell count climbing, sending blood cultures.  Foley catheter being placed for pressure ulcer caused by pure wick device   Consults: date of consult/date signed off & final recs:  Cards 10/10 Neurology 10/10 PCCM 10/10  Procedures (surgical and bedside):  10/10 Neuro IR >> s/p stent and angioplasty of distal basilar artery  10/10 ETT for procedure 10/10 ETT (reintubated) >> removed 10/21: Size 6 tracheostomy placed by Dr. Nelda Marseille  Significant Diagnostic Tests:  08/18/2018  CTH >>  No acute intracranial abnormalities. Mild white matter changes likely due to small vessel ischemia  08/1018 CTH >> 1. No acute abnormality and no change from earlier today 2. ASPECTS is 10  08/05/2018  CTA head and neck >>  1. 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 No definite acute thrombus identified. 2. Severe stenosis left posterior cerebral artery and moderate stenosis right posterior cerebral artery. 3. Mild stenosis left MCA and moderate stenosis left MCA bifurcation. 4. No significant carotid or vertebral artery stenosis in the neck.  08/11/2018 MRI - evolving pontine infarct.  08/15/18  Echocardiogram - normal LVSF with severe LVH.   Micro Data:  08/18/2018 MRSA PCR >>negative 10/21: Sputum culture: Abundant white blood cells, moderate GPC moderate G PR rare GNR>>> 10/23: Blood cultures x2>>>  Antimicrobials:  10/10 cefazolin preop 10/22: Vancomycin>>> 10/22: Ceftaz>> Subjective:  Denies shortness of breath.  Reports some mild discomfort on palpation of PEG  Objective   Blood pressure 140/74, pulse 81,  temperature 99.3 F (37.4 C), temperature source Axillary, resp. rate (Abnormal) 22, height _0  (1.626 m), weight 124.6 kg, last menstrual period 07/26/2018, SpO2 100 %.    Vent Mode: Stand-by FiO2 (%):  [35 %-40 %]  35 % Set Rate:  [15 bmp] 15 bmp Vt Set:  [430 mL] 430 mL PEEP:  [5 cmH20] 5 cmH20 Pressure Support:  [5 cmH20] 5 cmH20 Plateau Pressure:  [14 cmH20] 14 cmH20   Intake/Output Summary (Last 24 hours) at 08/27/2018 0848 Last data filed at 08/27/2018 0617 Gross per 24 hour  Intake 2594.27 ml  Output 1975 ml  Net 619.27 ml   Filed Weights   09/02/2018 0000 08/26/18 2100 08/27/18 0135  Weight: 122.8 kg 124.6 kg 124.6 kg    Examination: General 57 year old female patient awake, interactive, looks comfortable on aerosol trach collar HEENT: Normocephalic atraumatic lips and tongue are swollen but improved size 6 cuffed tracheostomy is unremarkable Pulmonary: Scattered rhonchi no accessory use on aerosol trach collar Cardiac: Regular rate and rhythm Abdomen: Soft, tender at PEG site but PEG site unremarkable positive bowel sounds Extremities: Generalized edema brisk capillary refill strong pulses skin is warm Neuro: Nods and interacts appropriately minimal movement, can wiggle toes on left only  Resolved Hospital Problem list    Assessment & Plan:   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   Swallowing dysfunction is not expected to improve sufficiently to permit extubation so tracheostomy and PEG tube are planned for Monday 10/21 Plan Continue aspirin and Brilinta  Will likely need skilled nursing facility   Respiratory Insufficiency due to inability to protect airway from dysarthria.  Expected with location of stroke.  Portable chest x-ray status post tracheostomy personally reviewed  From 10/21:, Tracheostomy is in satisfactory position, cardiomegaly, left basilar airspace disease -Now on trach collar x24 hours appears comfortable Plan DC ventilator from room Routine tracheostomy care Mobilize Antibiotics per below  Increased tracheal  secretions, nasal discharge, and leukocytosis -Worrisome for sinusitis and for tracheobronchitis: BAL sent from right upper lobe on 10/21, still pending -Spiking fevers last night Plan Due to ceftazidime and vancomycin follow-up cultures Will check blood cultures x2  Hypertensive Crisis Blood pressure much improved following tracheostomy Plan  Continue current dosing of Coreg, hydralazine, Norvasc and hydrochlorothiazide  Pressure ulcer Stage II on vulvar area felt secondary to pure wick device Plan Place Foley catheter to prevent moisture injury  Type 2 diabetes Plan Continue Lantus at 18 units twice daily Increased basal NovoLog with sliding scale Decreased rate of D5 water  Hypernatremia, with increased creatinine.  Both normalized with free water replacement  plan Decrease free water to 25 cc an hour Continue free water via tube  Anemia without evidence of bleeding Plan Trend CBC   Disposition / Summary of Today's Plan 08/27/18    Looks about the same.  Spiking fever last night white blood cell count climbing.  Overall she seems to be improving she is on broad-spectrum antibiotics.  We will check blood cultures today continue efforts for rehabilitation.  Backing down on free water   [employer]  Diet: TF Pain/Anxiety/Delirium protocol (if indicated): RASS goal 0 effective 10/22 VAP protocol (if indicated): Ordered DVT prophylaxis: SCDs  GI prophylaxis: Famotidine Hyperglycemia protocol: SSI, lantus Mobility: Out of bed Code Status: Full  Family Communication: Sister updated at bedside 10/21  Labs ( personally  Reviewed)  CBC: Recent  Labs  Lab 08/22/18 0317 08/23/18 0531 08/24/18 0542 08/31/2018 0646 08/26/18 0406 08/27/18 0354  WBC 10.3 12.9* 14.4* 14.3* 16.1* 20.6*  NEUTROABS 6.9  --   --   --   --   --   HGB 8.7* 8.7* 8.8* 8.2* 8.2* 7.9*  HCT 30.7* 31.5* 31.1* 29.5* 28.7* 27.9*  MCV 83.0 82.5 81.8 81.9 81.8 82.1  PLT 462* 541* 536* 558* 506* 460*      Basic Metabolic Panel: Recent Labs  Lab 08/23/18 0531 08/24/18 0542 08/29/2018 0646 08/26/18 0406 08/27/18 0354  NA 156* 151* 148* 146* 141  K 3.6 3.7 3.6 3.7 3.7  CL 122* 118* 115* 114* 109  CO2 _0 GLUCOSE 146* 209* 174* 217* 253*  BUN 52* 44* 38* 39* 39*  CREATININE 1.40* 1.22* 1.16* 1.22* 1.18*  CALCIUM 12.4* 12.0* 11.7* 11.6* 11.2*   GFR: Estimated Creatinine Clearance: 68.7 mL/min (A) (by C-G formula based on SCr of 1.18 mg/dL (H)). Recent Labs  Lab 08/24/18 0542 08/09/2018 0646 08/26/18 0406 08/27/18 0354  WBC 14.4* 14.3* 16.1* 20.6*    Liver Function Tests: No results for input(s): AST, ALT, ALKPHOS, BILITOT, PROT, ALBUMIN in the last 168 hours. No results for input(s): LIPASE, AMYLASE in the last 168 hours. No results for input(s): AMMONIA in the last 168 hours.  ABG    Component Value Date/Time   PHART 7.334 (L) 09/04/2018 2259   PCO2ART 41.9 08/24/2018 2259   PO2ART 275.0 (H) 08/17/2018 2259   HCO3 22.3 08/05/2018 2259   TCO2 24 08/11/2018 2259   ACIDBASEDEF 3.0 (H) 08/15/2018 2259   O2SAT 100.0 08/23/2018 2259     Coagulation Profile: Recent Labs  Lab 08/10/2018 0646  INR 1.20    Cardiac Enzymes: No results for input(s): CKTOTAL, CKMB, CKMBINDEX, TROPONINI in the last 168 hours.  HbA1C: Hgb A1c MFr Bld  Date/Time Value Ref Range Status  08/15/2018 05:12 AM 11.4 (H) 4.8 - 5.6 % Final    Comment:    (NOTE) Pre diabetes:          5.7%-6.4% Diabetes:              >6.4% Glycemic control for   <7.0% adults with diabetes   01/12/2017 02:43 AM 12.8 (H) 4.8 - 5.6 % Final    Comment:    (NOTE)         Pre-diabetes: 5.7 - 6.4         Diabetes: >6.4         Glycemic control for adults with diabetes: <7.0     CBG: Recent Labs  Lab 08/26/18 1550 08/26/18 1947 08/26/18 2339 08/27/18 0331 08/27/18 0811  GLUCAP 239* 223* 249* 225* [place] ACNP-BC [place] Pager # [phone] OR # [phone]  if no answer  Attending Note:  57 year old female s/p ischemic pontine stroke who presents to PCCM with respiratory failure from inability to protect her airway.  On exam, she is following very simple commands with coarse BS diffusely.  I reviewed CXR myself, trach is in a good position.  Discussed with PCCM-NP.  Will continue attempt at [facility] as able.  No decannulation.  Keep cuffed trach in for now.  Next week, may contemplate changing to cuffless assuming she is able to wean to TC.  TF per PEG.  Placement.  PCCM will continue to follow.  The patient is critically ill with multiple organ systems failure and requires high complexity decision  making for assessment and support, frequent evaluation and titration of therapies, application of advanced monitoring technologies and extensive interpretation of multiple databases.   Critical Care Time devoted to patient care services described in this note is  32  Minutes. This time reflects time of care of this signee Dr Jennet Maduro. This critical care time does not reflect procedure time, or teaching time or supervisory time of PA/NP/Med student/Med Resident etc but could involve care discussion time.  Rush Farmer, M.D. [facility] Pulmonary/Critical Care Medicine. Pager: [phone]. After hours pager: [phone].

## 2018-08-27 NOTE — Consult Note (Signed)
Andrea [name] wound consult note Patient examined in [place] with the assistance of her primary care RN, Andrea [name], and two other RNs for turning and re-positioning.  No family present Reason for Consult: Vulvar pressure injuries Wound type: Stage 2 from use of PureWick Pressure Injury POA: No Measurement:Left labia majora has a linear Stage 2 PI that measures 2 cm x 0.4 cm.  The right labia majora has a Stage 2 PI that measures approximately 0.8 cm x 0.3 cm.  Both wound beds are 100% pink, clean. Periwound tissue is intact, normal color and texture except for 3 - 4 very small, scattered, rounded areas that are hypopigmented, but not open wounds. Dressing procedure/placement/frequency: Desitin, 40% to affected areas 4 times daily.   Additionally, consider stopping the use of the PureWick for a few days to allow the areas to heal, then possibly re-implementing the device. Monitor the wound area(s) for worsening of condition such as: Signs/symptoms of infection,  Increase in size,  Development of or worsening of odor, Development of pain, or increased pain at the affected locations.  Notify the medical team if any of these develop.  Thank you for the consult.  Discussed plan of care with the patient and bedside [name].  [name] will not follow at this time.  Please re-consult the [place] team if needed.  Val Riles, RN, MSN, CWOCN, CNS-BC, pager [phone]

## 2018-08-28 LAB — BASIC METABOLIC PANEL
ANION GAP: 8 (ref 5–15)
BUN: 33 mg/dL — ABNORMAL HIGH (ref 6–20)
CO2: 25 mmol/L (ref 22–32)
Calcium: 10.9 mg/dL — ABNORMAL HIGH (ref 8.9–10.3)
Chloride: 104 mmol/L (ref 98–111)
Creatinine, Ser: 1.06 mg/dL — ABNORMAL HIGH (ref 0.44–1.00)
GFR, EST NON AFRICAN AMERICAN: 57 mL/min — AB (ref >60)
GLUCOSE: 219 mg/dL — AB (ref 70–99)
POTASSIUM: 3.3 mmol/L — AB (ref 3.5–5.1)
SODIUM: 137 mmol/L (ref 135–145)

## 2018-08-28 LAB — GLUCOSE, CAPILLARY
GLUCOSE-CAPILLARY: 172 mg/dL — AB (ref 70–99)
GLUCOSE-CAPILLARY: 236 mg/dL — AB (ref 70–99)
Glucose-Capillary: 187 mg/dL — ABNORMAL HIGH (ref 70–99)
Glucose-Capillary: 213 mg/dL — ABNORMAL HIGH (ref 70–99)
Glucose-Capillary: 213 mg/dL — ABNORMAL HIGH (ref 70–99)
Glucose-Capillary: 227 mg/dL — ABNORMAL HIGH (ref 70–99)
Glucose-Capillary: 228 mg/dL — ABNORMAL HIGH (ref 70–99)

## 2018-08-28 LAB — CBC
HCT: 25.9 % — ABNORMAL LOW (ref 36.0–46.0)
Hemoglobin: 7.7 g/dL — ABNORMAL LOW (ref 12.0–15.0)
MCH: 23.3 pg — ABNORMAL LOW (ref 26.0–34.0)
MCHC: 29.7 g/dL — ABNORMAL LOW (ref 30.0–36.0)
MCV: 78.2 fL — ABNORMAL LOW (ref 80.0–100.0)
NRBC: 0.3 % — AB (ref 0.0–0.2)
PLATELETS: 475 10*3/uL — AB (ref 150–400)
RBC: 3.31 MIL/uL — AB (ref 3.87–5.11)
RDW: 15.1 % (ref 11.5–15.5)
WBC: 19.4 10*3/uL — AB (ref 4.0–10.5)

## 2018-08-28 MED ORDER — TICAGRELOR 90 MG PO TABS
90.0000 mg | ORAL_TABLET | Freq: Two times a day (BID) | ORAL | Status: DC
  Administered 2018-08-28 – 2018-11-13 (×154): 90 mg
  Filled 2018-08-28 (×153): qty 1

## 2018-08-28 MED ORDER — POTASSIUM CHLORIDE 20 MEQ/15ML (10%) PO SOLN
40.0000 meq | Freq: Three times a day (TID) | ORAL | Status: AC
  Administered 2018-08-28 (×2): 40 meq
  Filled 2018-08-28 (×2): qty 30

## 2018-08-28 MED ORDER — INSULIN ASPART 100 UNIT/ML ~~LOC~~ SOLN
4.0000 [IU] | SUBCUTANEOUS | Status: DC
  Administered 2018-08-28 – 2018-08-29 (×6): 4 [IU] via SUBCUTANEOUS

## 2018-08-28 MED ORDER — HYDRALAZINE HCL 25 MG PO TABS
25.0000 mg | ORAL_TABLET | Freq: Three times a day (TID) | ORAL | Status: DC
  Administered 2018-08-28 – 2018-09-03 (×17): 25 mg via ORAL
  Filled 2018-08-28 (×18): qty 1

## 2018-08-28 MED ORDER — MEGESTROL ACETATE 40 MG PO TABS
80.0000 mg | ORAL_TABLET | Freq: Two times a day (BID) | ORAL | Status: DC
  Administered 2018-08-28 – 2018-09-03 (×12): 80 mg via ORAL
  Filled 2018-08-28 (×14): qty 2

## 2018-08-28 MED ORDER — SODIUM CHLORIDE 0.9 % IV SOLN
INTRAVENOUS | Status: DC
  Administered 2018-08-28 – 2018-09-09 (×13): via INTRAVENOUS

## 2018-08-28 MED ORDER — INSULIN GLARGINE 100 UNIT/ML ~~LOC~~ SOLN
15.0000 [IU] | Freq: Two times a day (BID) | SUBCUTANEOUS | Status: DC
  Administered 2018-08-28 – 2018-08-29 (×2): 15 [IU] via SUBCUTANEOUS
  Filled 2018-08-28 (×2): qty 0.15

## 2018-08-28 NOTE — Progress Notes (Signed)
  Speech Language Pathology Treatment: Dysphagia;Cognitive-Linquistic;Passy Muir Speaking valve  Patient Details Name: Andrea [name] MRN: [id number] DOB: December 05, 1960 Today's Date: 08/28/2018 Time: 7989-2119 SLP Time Calculation (min) (ACUTE ONLY): 25 min  Assessment / Plan / Recommendation Clinical Impression  Pt demonstrates improved potential with PMSV today. Pt alert, motivated. Noted to have standing oral secretions but also spontaneous swallows observed at rest. Pt tolerated overnight with cuff deflated. When PMSV placed audible but restricted airflow to upper airway observed. Audible phonation and oropharyngeal secretions. Provided suction to base of tongue during cough response with PMSV placed with significant return of secretions. After clearing out pharynx partially and turning pts head more to midline, airflow to upper airway even more significant. Pt able to phonate on command, say her name intelligibly. Unintelligible if target unknown. Andrea [name] was able to [employer] an ice chip manipulate and transit a bolus and trigger a swallow on command, which is unexpected and excellent.   Pt needs a smaller trach to progress. She needs consistent airflow to her upper airway to sense and attempt to manage secretions, phonate, communicate and attempt dysphagia therapy. For now PMSV can only be used with SLP, but with a smaller trach she could advance. Will discuss with MD.    HPI HPI: 57 yo presented to ED with chest pain noted Rt hemiparesis in ED with acute Left paramedian brainstem infarct. Intubated 10/10 for angioplasty, extubated post procedure and reintubated. Repeat MRI with brainstem infarct extension and bil cerebellar infarcts. Peg/trach 08/31/2018.PMHx: HTN, DM, CKD      SLP Plan  Continue with current plan of care       Recommendations  Diet recommendations: NPO      Patient may use Passy-Muir Speech Valve: with SLP only PMSV Supervision: Full MD: Please consider changing trach  tube to : Smaller size;Cuffless         Oral Care Recommendations: Oral care QID Follow up Recommendations: LTACH Plan: Continue with current plan of care       GO               Herbie [place], MA [place] Pager [phone] Office [phone]  Lynann Beaver 08/28/2018, 9:55 AM

## 2018-08-28 NOTE — Progress Notes (Signed)
NAME:  Andrea [name], MRN:  [id number], DOB:  Apr 06, 1961, LOS: 11 ADMISSION DATE:  08/28/2018, CONSULTATION DATE:  08/18/2018 REFERRING MD:  Dr. Rory Percy, CHIEF COMPLAINT:  CP/ Acute CVA  Brief History   21 yoF w/poorly controlled HTN and IDDM presenting with CP and left arm pain w/BP 220/117. Initial CTH neg.  Cardiac workup negative for acute event thus far, cardiology following.  Code stroke initiated for R hemiplegia, dysarthria and right facial droop.  Out of window for TPA.  Requiring cleviprex for BP control.  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  Evolving pontine stroke on MRI. Intubated for neuro IR s/p stenting and angioplasty with 80-90% patency.  Extubated post procedure but due to insufficency placed on BiPAP and brought to ICU.    Past Medical History  HTN, IDDM, hypothyroidism s/p ablation  Significant Hospital Events   10/9 present to [facility] ED -transferred to [facility] 10/9 basilar artery stenting 10/9 intubated urgently 6h post procedure for inability to protect airway. 10/10 CTA for declining exam - evolving pontine infarct, stent deemed to be patent 10/14 Brillinta stopped and ASA/ heparin started to in anticipation of tracheostomy on Monday. 10/14 through 10/19: Neuro exam has been stable sodium increasing in spite of free water via tube, providing supportive care awaiting trach 10/20: Sodium down to 151 from 156 clinically looks the same 10/21: Sodium down to 148, sputum culture sent for purulent tracheal secretions, awaiting trach and PEG.  Purulent bronchial secretions from right upper lobe sent for BAL 10/22: Tracheostomy and PEG completed on the 21st tolerated well.  Spiking low-grade fever white cells continued to climb starting empiric vancomycin and ceftaz.  Sodium improved down to 146.  Blood pressure improving but still not at goal added hydrochlorothiazide.  Resumed Brilinta.  10/23: Sodium normal.  Backing down on free water.  Glycemic control  challenging, increased basal NovoLog in addition to Lantus and sliding scale.  Still spiking fever, white blood cell count climbing, sending blood cultures.  Foley catheter being placed for pressure ulcer caused by pure wick device   Consults: date of consult/date signed off & final recs:  Cards 10/10 Neurology 10/10 PCCM 10/10  Procedures (surgical and bedside):  10/10 Neuro IR >> s/p stent and angioplasty of distal basilar artery  10/10 ETT for procedure 10/10 ETT (reintubated) >> removed 10/21: Size 6 tracheostomy placed by Dr. Nelda Marseille  Significant Diagnostic Tests:  09/02/2018  CTH >>  No acute intracranial abnormalities. Mild white matter changes likely due to small vessel ischemia  08/1018 CTH >> 1. No acute abnormality and no change from earlier today 2. ASPECTS is 10  08/07/2018  CTA head and neck >>  1. 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 No definite acute thrombus identified. 2. Severe stenosis left posterior cerebral artery and moderate stenosis right posterior cerebral artery. 3. Mild stenosis left MCA and moderate stenosis left MCA bifurcation. 4. No significant carotid or vertebral artery stenosis in the neck.  08/18/2018 MRI - evolving pontine infarct.  08/15/18  Echocardiogram - normal LVSF with severe LVH.   Micro Data:  08/28/2018 MRSA PCR >>negative 10/21: Sputum culture: Abundant white blood cells, moderate GPC moderate G PR rare GNR>>> 10/23: Blood cultures x2>>>  Antimicrobials:  10/10 cefazolin preop 10/22: Vancomycin>>> 10/22: Ceftaz>>  Subjective:  No events overnight No new complaints  Objective   Blood pressure (!) 153/75, pulse 85, temperature 99.6 F (37.6 C),  temperature source Axillary, resp. rate (!) 24, height _0  (1.626 m), weight 128 kg, last menstrual period 07/26/2018, SpO2 99 %.    FiO2 (%):  [28 %-35 %] 28 %   Intake/Output Summary (Last 24 hours) at 08/28/2018  0850 Last data filed at 08/28/2018 0800 Gross per 24 hour  Intake 3079.82 ml  Output 3200 ml  Net -120.18 ml   Filed Weights   08/26/18 2100 08/27/18 0135 08/28/18 0500  Weight: 124.6 kg 124.6 kg 128 kg    Examination: General Chronically ill appearing female, NAD on TC HEENT: [place]/AT, PERRL, EOM-I and MMM Pulmonary: Coarse BS diffusely Cardiac: RRR, Nl S1/S2 and -M/R/G Abdomen: Soft, NT, ND and +BS Extremities: -edema and -tenderness Neuro: Nods and interacts appropriately minimal movement, can wiggle toes on left only  I reviewed CXR myself, trach is in a good position  Resolved Hospital Problem list    Assessment & Plan:   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   Swallowing dysfunction is not expected to improve sufficiently to permit extubation so tracheostomy and PEG tube are planned for Monday 10/21 Plan Continue ASA and Brilinta  Needs SNF placement  Respiratory Insufficiency due to inability to protect airway from dysarthria.  Expected with location of stroke.  Portable chest x-ray status post tracheostomy personally reviewed  From 10/21:, Tracheostomy is in satisfactory position, cardiomegaly, left basilar airspace disease -Now on trach collar x24 hours appears comfortable Plan D/C vent from room Trach care per routine Will take the sutures out on 10/28 Mobilize Antibiotics per below  Increased tracheal secretions, nasal discharge, and leukocytosis -Worrisome for sinusitis and for tracheobronchitis: BAL sent from right upper lobe on 10/21, still pending -Spiking fevers last night Plan Ceftazidime and vancomycin follow-up cultures, last dose 10/28 F/U on cultures  Hypertensive Crisis Blood pressure much improved following tracheostomy Plan  Continue current dosing of Coreg, hydralazine, Norvasc and  hydrochlorothiazide  Pressure ulcer Stage II on vulva area felt secondary to pure wick device Plan Place Foley catheter to prevent moisture injury and monitor clinically  Type 2 diabetes Plan Continue Lantus at 18 units twice daily Increased basal NovoLog with sliding scale Decreased rate of D5 water  Hypernatremia, with increased creatinine.  Both normalized with free water replacement  plan Decrease free water to 25 cc an hour Continue free water via tube  Anemia without evidence of bleeding Plan Trend CBC  Disposition / Summary of Today's Plan 08/28/18    Fever resolved, stop date as above for abx, continue TF and TC, awaiting placement, will likely be ready to go by next week from a PCCM standpoint  Best Practice  Diet: TF Pain/Anxiety/Delirium protocol (if indicated): RASS goal 0 effective 10/22 VAP protocol (if indicated): Ordered DVT prophylaxis: SCDs  GI prophylaxis: Famotidine Hyperglycemia protocol: SSI, lantus Mobility: Out of bed Code Status: Full  Family Communication: Sister updated at bedside 10/21  Labs ( personally  Reviewed)  CBC: Recent Labs  Lab 08/22/18 0317  08/24/18 0542 08/26/2018 0646 08/26/18 0406 08/27/18 0354 08/28/18 0357  WBC 10.3   < > 14.4* 14.3* 16.1* 20.6* 19.4*  NEUTROABS 6.9  --   --   --   --   --   --   HGB 8.7*   < > 8.8* 8.2* 8.2* 7.9* 7.7*  HCT 30.7*   < > 31.1* 29.5* 28.7* 27.9* 25.9*  MCV 83.0   < >  81.8 81.9 81.8 82.1 78.2*  PLT 462*   < > 536* 558* 506* 460* 475*   < > = values in this interval not displayed.    Basic Metabolic Panel: Recent Labs  Lab 08/24/18 0542 08/24/2018 0646 08/26/18 0406 08/27/18 0354 08/28/18 0357  NA 151* 148* 146* 141 137  K 3.7 3.6 3.7 3.7 3.3*  CL 118* 115* 114* 109 104  CO2 _0 GLUCOSE 209* 174* 217* 253* 219*  BUN 44* 38* 39* 39* 33*  CREATININE 1.22* 1.16* 1.22* 1.18* 1.06*  CALCIUM 12.0* 11.7* 11.6* 11.2* 10.9*   GFR: Estimated Creatinine Clearance: 77.6  mL/min (A) (by C-G formula based on SCr of 1.06 mg/dL (H)). Recent Labs  Lab 08/21/2018 0646 08/26/18 0406 08/27/18 0354 08/28/18 0357  WBC 14.3* 16.1* 20.6* 19.4*    Liver Function Tests: No results for input(s): AST, ALT, ALKPHOS, BILITOT, PROT, ALBUMIN in the last 168 hours. No results for input(s): LIPASE, AMYLASE in the last 168 hours. No results for input(s): AMMONIA in the last 168 hours.  ABG    Component Value Date/Time   PHART 7.334 (L) 08/30/2018 2259   PCO2ART 41.9 08/22/2018 2259   PO2ART 275.0 (H) 08/22/2018 2259   HCO3 22.3 09/03/2018 2259   TCO2 24 08/22/2018 2259   ACIDBASEDEF 3.0 (H) 08/29/2018 2259   O2SAT 100.0 08/20/2018 2259     Coagulation Profile: Recent Labs  Lab 08/10/2018 0646  INR 1.20    Cardiac Enzymes: No results for input(s): CKTOTAL, CKMB, CKMBINDEX, TROPONINI in the last 168 hours.  HbA1C: Hgb A1c MFr Bld  Date/Time Value Ref Range Status  08/15/2018 05:12 AM 11.4 (H) 4.8 - 5.6 % Final    Comment:    (NOTE) Pre diabetes:          5.7%-6.4% Diabetes:              >6.4% Glycemic control for   <7.0% adults with diabetes   01/12/2017 02:43 AM 12.8 (H) 4.8 - 5.6 % Final    Comment:    (NOTE)         Pre-diabetes: 5.7 - 6.4         Diabetes: >6.4         Glycemic control for adults with diabetes: <7.0     CBG: Recent Labs  Lab 08/27/18 1157 08/27/18 1554 08/27/18 1955 08/28/18 0007 08/28/18 0329  GLUCAP 298* 241* 192* 227* 213*   Discussed with PCCM-NP and neurology MD  Rush Farmer, M.D. [facility] Pulmonary/Critical Care Medicine. Pager: [phone]. After hours pager: [phone].

## 2018-08-28 NOTE — Progress Notes (Addendum)
Occupational Therapy Treatment Patient Details Name: Andrea [name] MRN: [id number] DOB: 1961/10/05 Today's Date: 08/28/2018    History of present illness 57 yo presented to ED with chest pain noted Rt hemiparesis in ED with acute Left paramedian brainstem infarct. Intubated 10/10 for angioplasty, extubated post procedure and reintubated. Repeat MRI with brainstem infarct extension and bil cerebellar infarcts. Peg/trach 09/01/2018.PMHx: HTN, DM, CKD   OT comments  Pt with excellent participation. Demonstrating small increase in active movement in L side. Increased head movement with using music during session. Will order Tilt bed tomorrow.   Follow Up Recommendations    LTACH   Equipment Recommendations    TBA   Recommendations for Other Services      Precautions / Restrictions Precautions Precautions: Fall Precaution Comments: Peg/trach       Mobility Bed Mobility    Total A +2              Transfers        not attempted              Balance    sitting - zero                                       ADL either performed or assessed with clinical judgement   ADL    Hand over hand to use yonker and wipe mouth Total A with all ADL                                           Vision  Nystagmus noted in lateral gaze; complaints of dizziness; improves with time; able to sustain gaze on objects; does not appear to have diplopia; most likely blurred vision but appears functional     Perception     Praxis      Cognition    following commands; interacting appropriately; excellent effort and participation; joking with therapist during session; brighter affect; enjoys music during session                                            Exercises  BUE PROM through full range as allowed by apparent shoulder discomfort; including B scapular ROM LUE AAROM; facilitation of movement; increased proximal movement,  especially with adduction; trace finger movement observed Moving LLE - movement appears to initiate from hip Neck ROM - able to rangei ncervical rotation; pt able to maintain at midline during session; prefers L cervcial posture   Shoulder Instructions  Keep BUE supported on 2 pillows to reduce risk of subluxation     General Comments      Pertinent Vitals/ Pain       Pain Assessment: Faces Faces Pain Scale: Hurts little more Pain Location: with neck movement and B shoulder flexion above 80 degrees Pain Descriptors / Indicators: Discomfort;Grimacing Pain Intervention(s): Limited activity within patient's tolerance  Home Living                                          Prior Functioning/Environment  Frequency     3x/wk      Progress Toward Goals  OT Goals(current goals can now be found in the care plan section)   progressing     Plan   continue to recommend LTACH   Co-evaluation                 AM-PAC PT "6 Clicks" Daily Activity     Outcome Measure                    End of Session  Left in bed in chair position. Nsg made aware      Activity Tolerance  good tolerance; excellent participation   Patient Left  in bed; call bell within reach; family present   Nurse Communication  positioning; mobility status        Time: 1100-1137 OT Time Calculation (min): 37 min  Charges: OT General Charges $OT Visit: 1 Visit OT Treatments $Neuromuscular Re-education: 23-37 mins  Maurie Boettcher, OT/L   Acute OT Clinical Specialist [place] Pager [phone] Office [phone]    [facility] 08/28/2018, 5:24 PM

## 2018-08-28 NOTE — Progress Notes (Signed)
STROKE TEAM PROGRESS NOTE   SUBJECTIVE (INTERVAL HISTORY) Her sister and godmom are at the bedside.  Pt is sitting in converted bed walking with OT. Still has quadriplegia with some improvement on the left. Na 137, d/c free water and D5, continue NS. Will need to adjust insulin based on glucose.   OBJECTIVE Vitals:   08/28/18 0847 08/28/18 0900 08/28/18 1000 08/28/18 1100  BP:  (!) 154/78 (!) 162/81 (!) 157/82  Pulse:  86 87 89  Resp:  (!) 40 (!) 25 19  Temp:      TempSrc:      SpO2: 99% 98% 96% 98%  Weight:      Height:        CBC:  Recent Labs  Lab 08/22/18 0317  08/27/18 0354 08/28/18 0357  WBC 10.3   < > 20.6* 19.4*  NEUTROABS 6.9  --   --   --   HGB 8.7*   < > 7.9* 7.7*  HCT 30.7*   < > 27.9* 25.9*  MCV 83.0   < > 82.1 78.2*  PLT 462*   < > 460* 475*   < > = values in this interval not displayed.    Basic Metabolic Panel:  Recent Labs  Lab 08/27/18 0354 08/28/18 0357  NA 141 137  K 3.7 3.3*  CL 109 104  CO2 23 25  GLUCOSE 253* 219*  BUN 39* 33*  CREATININE 1.18* 1.06*  CALCIUM 11.2* 10.9*    Lipid Panel:     Component Value Date/Time   CHOL 256 (H) 08/15/2018 0512   TRIG 248 (H) 08/15/2018 0512   HDL 35 (L) 08/15/2018 0512   CHOLHDL 7.3 08/15/2018 0512   VLDL 50 (H) 08/15/2018 0512   LDLCALC 171 (H) 08/15/2018 0512   HgbA1c:  Lab Results  Component Value Date   HGBA1C 11.4 (H) 08/15/2018   Urine Drug Screen:     Component Value Date/Time   LABOPIA NONE DETECTED 08/26/2018 1836   COCAINSCRNUR NONE DETECTED 08/10/2018 1836   LABBENZ NONE DETECTED 08/13/2018 1836   AMPHETMU NONE DETECTED 08/13/2018 1836   THCU NONE DETECTED 08/24/2018 1836   LABBARB NONE DETECTED 08/09/2018 1836    Alcohol Level No results found for: ETH  IMAGING  Ct Angio Head W Or Wo Contrast Ct Angio Neck W Or Wo Contrast 08/15/2018 IMPRESSION:  1. Interval basilar to left proximal PCA stenting. The density of the stent walls precludes detection of in stent  stenosis; there is a degree of wasting at the mid basilar segment. There is flow in the left PCA beyond the stent such that there is presumed stent patency.  2. Known lower pontine infarct. There are new small cerebellar infarcts since brain MRI yesterday.  3. Severe left P2 segment stenosis, also seen previously.  4. Moderate atheromatous narrowing in the proximal left MCA.    Ct Angio Head W Or Wo Contrast Ct Angio Neck W Or Wo Contrast 08/13/2018 IMPRESSION:  1. 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 No definite acute thrombus identified.  2. Severe stenosis left posterior cerebral artery and moderate stenosis right posterior cerebral artery.  3. Mild stenosis left MCA and moderate stenosis left MCA bifurcation.  4. No significant carotid or vertebral artery stenosis in the neck.    Ct Head Wo Contrast 08/23/2018 IMPRESSION:  No acute intracranial abnormalities. Mild white matter changes likely due to small vessel ischemia.    Mr Brain Lottie Dawson  Contrast 09/02/2018 IMPRESSION:  1. Acute nonhemorrhagic infarct involving the left paramedian brainstem. The infarct crosses midline.  2. Other periventricular and subcortical white matter disease is moderately advanced for age. This likely reflects the sequela of chronic microvascular ischemia.  3. Tapering of the dens with prominent soft tissue pannus. 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    Ct Head Code Stroke Wo Contrast 08/25/2018  IMPRESSION:  1. No acute abnormality and no change from earlier today  2. ASPECTS is 10 3.    Cerebral Angiogram 09/01/2018 S/P 4 vessel cerebral arteriogram RT CFA approach. 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 S/P stent assisted angioplasty of distal basilar artery with patency of 80 to 90 %. 2.Approx 70 % stenosis of  LT ICA supraclinoid seg   MRI 08/15/18 1. Progressive acute pontine infarct. New patchy bilateral cerebellar infarction. New tiny left thalamic infarcts. 2. Preserved flow void in the stented basilar. 3. left facial/submandibular edema, please correlate with neck exam. Edited result: IMPRESSION: 1. Progressive acute pontine infarct. New patchy bilateral cerebellar infarction. New tiny left thalamic infarcts. 2. Preserved flow void in the stented basilar.  TTE - Left ventricle: The cavity size was normal. There was severe   concentric hypertrophy. Systolic function was normal. The   estimated ejection fraction was in the range of 60% to 65%. Wall   motion was normal; there were no regional wall motion   abnormalities. Doppler parameters are consistent with abnormal   left ventricular relaxation (grade 1 diastolic dysfunction). The   E/e&' ratio is <8, suggesting normal LV filling pressure. - Aortic valve: Trileaflet; mildly calcified leaflets.   Transvalvular velocity was minimally increased. There was no   regurgitation. Mean gradient (S): 12 mm Hg. - Mitral valve: Mildly thickened leaflets . There was trivial   regurgitation. - Left atrium: The atrium was normal in size. - Inferior vena cava: The vessel was dilated. The respirophasic   diameter changes were blunted (< 50%), consistent with elevated   central venous pressure. Impressions: - LVEF 60-65%, severe LVH, normal wall motion, grade 1 DD, normal   LV filling pressure, minimally increased aortic velocity without   signficant stenosis, trivial MR, normal LA size, dilated IVC.  Dg Chest Port 1 View  Result Date: 08/23/2018 CLINICAL DATA:  Encounter for fever. EXAM: PORTABLE CHEST 1 VIEW COMPARISON:  08/19/2018 FINDINGS: Endotracheal tube is 3.6 cm above the carina. Airspace densities in the right hilum and right lower lung have markedly decreased and possibly resolved. There may be a small amount of residual disease in the  medial right lower lung. Vascular crowding in the hilar regions. Low lung volumes. Heart size is grossly stable and within normal limits. Nasogastric tube extends into the abdomen. Negative for pneumothorax. IMPRESSION: Airspace disease in the right lung has markedly decreased. Low lung volumes. Support apparatuses as described. Electronically Signed   By: Markus Daft M.D.   On: 08/23/2018 11:07    PHYSICAL EXAM  Temp:  [98.7 F (37.1 C)-99.8 F (37.7 C)] 99.8 F (37.7 C) (10/24 0800) Pulse Rate:  [75-97] 89 (10/24 1100) Resp:  [14-42] 19 (10/24 1100) BP: (130-178)/(66-99) 157/82 (10/24 1100) SpO2:  [96 %-100 %] 98 % (10/24 1100) FiO2 (%):  [28 %] 28 % (10/24 0800) Weight:  [353 kg] 128 kg (10/24 0500)  General -  Obese middle-aged African-American lady, not in distress.    Ophthalmologic -  fundi not visualized due to noncooperation.  Cardiovascular - Regular rate and rhythm.  Neuro - post trach, on trach collar, off sedation. Eyes open, awake, alert. No ptosis or EOMI, denies diplopia. PERRL. blinking to visual threat bilaterally. Right gaze nystagmus is improved but still persistent. Bilateral facial weakness, R>L, corneal, gag and cough present. Able to open mouth slightly but not able to protrude tongue. Quadriplegia, with left LE 2-/5 proximal and 0/5 distal. DTR diminished. Sensation not cooperative, coordination and gait not tested.   ASSESSMENT/PLAN Ms. LORRANE MCCAY is a 57 y.o. female with history of difficult to control hypertension, insulin-dependent diabetes, obesity, hypothyroidism status post ablation presenting with chest discomfort, right-sided weakness, slurred speech and right facial droop. She did not receive IV t-PA due to late presentation. S/P stent assisted angioplasty of distal basilar artery.  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 Worsening symptoms with extension of pontine/medullary infarcts with new small  bilateral cerebellar infarcts without evidence of stent re-stenosis or occlusion  Resultant b// UE and LE weakness, eyes minimally disconjugate but improving, nystagmus  CT head - No acute intracranial abnormalities.  CTA H&N 08/23/2018 - Extensive atherosclerotic disease in the basilar with focal critical stenosis in the mid to distal basilar. No definite acute thrombus identified. Severe stenosis left posterior cerebral artery  MRI head - Acute nonhemorrhagic infarct involving the left paramedian brainstem.   CTA H&N 08/15/2018 - stent too dense to see BA lumen, but distal flow preserved. New small cerebellar infarcts since brain MRI yesterday.  Severe left P2 segment stenosis, also seen previously.   Repeat MRI - extension of b/l pontine and upper medullary infarcts with new b/l cerebellar infarcts.  2D Echo - EF 60-65%  LDL - 174  HgbA1c - 11.1  VTE prophylaxis - heparin subq  aspirin 81 mg daily prior to admission, now resumed on Brilinta 90 mg bid and ASA 81 mg daily.   Patient counseled to be compliant with her antithrombotic medications  Ongoing aggressive stroke risk factor management  Therapy recommendations:  LTACH  Disposition:  Pending  Intracranial stenosis  BA mid to distal severe stenosis s/p BA stenting  Left PCA severe stenosis, R PCA moderate stenosis  Left MCA moderate stenosis  Uncontrolled stroke risk factors with HLD, HTN, DM  Non compliance with meds at home  Respiratory failure  S/p trach 08/27/2018  On trach collar now, tolerating well  Still has copious secretions  Hypertension  Stable on the high end  BP goal 130-150   On po norvasc, losartan  Metoprolol changed to Coreg 25 bid, also on HCTZ 25mg  now  Add low dose hydralazine 25mg  tid  Hyperlipidemia  Lipid lowering medication PTA:  Lipitor 20 mg daily  LDL 174, goal < 70  Increase to 80 mg daily  Continue statin at discharge  Diabetes  HgbA1c 11.4, goal <  7.0  Uncontrolled  Hyperglycemia continues with D5 on board - now off  Lantus back to 15U bid  Continue basal NovoLog back to 4U Q4h  SSI  CBG monitoring  Low grade fever with leukocytosis  Tmax 100.7 -> afebrile->100.7->101-> afebrile   Leukocytosis - 12.7->12.9->14.4->14.3->16.1->20.6->19.4  UA neg for UTI  CXR unremarkable, improving from prior  Sputum culture normal flora  On vanco and fortaz for empiric treatment - off on 08/24/2018  CCM signed off  Hypernatremia and elevated Cre/BUN  Na 156->156->151->148->146->141->137  Cre 1.41->1.40->1.22->1.16->1.22->1.18->1.06  Put on NS @ 50  On TF @  35  Continue BMP monitoring  Dysphagia s/p PEG  Continue tube feeding 55 cc/h  PEG done 08/14/2018  Anemia   Hb 11.1->9.7->9.4->9.3->9.4->8.8->8.2->7.9->7.7  Likely due to iron deficiency and acute uterine bleeding  Iron panel showed iron deficiency  Put on iron pills  Abnormal uterine bleeding  Has been following with GYN  Was on megace in the past  Will resume megace 80mg  bid  Continue ASA and brilinta for now  Close CBC monitoring  Other Stroke Risk Factors  Obesity, Body mass index is 48.44 kg/m., recommend weight loss, diet and exercise as appropriate   Other Active Problems  Hypokalemia - on supplement for K 3.3  Hospital day # 14  This patient is critically ill due to worsening brainstem infarct, BA stenosis s/p stenting, hyperglycemia, hypertensive, fever and hypernatremia and at significant risk of neurological worsening, death form recurrent stroke, hemorrhagic conversion, CHF, seizure, aspiration. This patient's care requires constant monitoring of vital signs, hemodynamics, respiratory and cardiac monitoring, review of multiple databases, neurological assessment, discussion with family, other specialists and medical decision making of high complexity. I spent 40 minutes of neurocritical care time in the care of this patient.  Rosalin Hawking, MD PhD Stroke Neurology 08/28/2018 12:21 PM     To contact Stroke Continuity provider, please refer to [email or web address] After hours, contact General Neurology

## 2018-08-28 NOTE — Progress Notes (Signed)
Physical Therapy Treatment Patient Details Name: Andrea [name] MRN: [id number] DOB: [birth year] Today's Date: 08/28/2018    History of Present Illness 57 yo presented to ED with chest pain noted Rt hemiparesis in ED with acute Left paramedian brainstem infarct. Intubated 10/10 for angioplasty, extubated post procedure and reintubated. Repeat MRI with brainstem infarct extension and bil cerebellar infarcts. Peg/trach 08/19/2018.PMHx: HTN, DM, CKD    PT Comments    Goals re-assessed and downgraded.  Pt tolerated EOB well today but continues to show only trace movement on her left side with no muscular activation on her right side that I can tell.  She is seemingly understanding everything I ask her and is more animate in her face and eyebrows today.  She tolerated upright positioning on 28% TC with BPs and HR stable.    Follow Up Recommendations  [facility] bed;Wheelchair (measurements PT);Other (comment)(hoyer lift)    Recommendations for Other Services   NA     Precautions / Restrictions Precautions Precautions: Fall Precaution Comments: Peg/trach    Mobility  Bed Mobility Overal bed mobility: Needs Assistance Bed Mobility: Supine to Sit;Sit to Supine     Supine to sit: Total assist;+2 for physical assistance;HOB elevated Sit to supine: Total assist;+2 for physical assistance   General bed mobility comments: Two person total assist to move to EOB.  Pt unable to initiate any movement to assist with her mobility.    Transfers                 General transfer comment: lifting OOB to chair would be our next step of progression for this patient.   Ambulation/Gait             General Gait Details: unable       Modified Rankin (Stroke Patients Only) Modified Rankin (Stroke Patients Only) Pre-Morbid Rankin Score: No symptoms Modified Rankin: Severe disability     Balance Overall balance assessment: Needs  assistance Sitting-balance support: Feet supported;Bilateral upper extremity supported Sitting balance-Leahy Scale: Zero Sitting balance - Comments: max assist EOB.  Pt attempting to hold head up with cues, however, she is unable to sustain and needs physical assist to support her own trunk with no sigs of balance reaction or trunk strength in sitting.  VSS throughout on 28% TC.  BPs stable and O2 sats in the 90s.                                      Cognition Arousal/Alertness: Awake/alert Behavior During Therapy: WFL for tasks assessed/performed(more animated facial expressions today) Overall Cognitive Status: Difficult to assess Area of Impairment: Attention;Following commands                   Current Attention Level: Sustained   Following Commands: Follows one step commands consistently(to the best of her physical ability)                     Pertinent Vitals/Pain Pain Assessment: Faces Faces Pain Scale: Hurts little more Pain Location: with neck support Pain Descriptors / Indicators: Discomfort;Grimacing Pain Intervention(s): Limited activity within patient's tolerance;Monitored during session;Repositioned           PT Goals (current goals can now be found in the care plan section) Acute Rehab PT Goals Patient Stated Goal: per family for pt to get stronger PT Goal Formulation: Patient unable  to participate in goal setting Time For Goal Achievement: 09/11/18 Potential to Achieve Goals: Fair Progress towards PT goals: Progressing toward goals(goals re-assessed.)    Frequency    Min 2X/week      PT Plan Current plan remains appropriate       AM-PAC PT "6 Clicks" Daily Activity  Outcome Measure  Difficulty turning over in bed (including adjusting bedclothes, sheets and blankets)?: Unable Difficulty moving from lying on back to sitting on the side of the bed? : Unable Difficulty sitting down on and standing up from a chair with arms  (e.g., wheelchair, bedside commode, etc,.)?: Unable Help needed moving to and from a bed to chair (including a wheelchair)?: Total Help needed walking in hospital room?: Total Help needed climbing 3-5 steps with a railing? : Total 6 Click Score: 6    End of Session Equipment Utilized During Treatment: Oxygen;Other (comment)(trach collar) Activity Tolerance: Patient tolerated treatment well Patient left: in bed;with call bell/phone within reach;with family/visitor present;Other (comment)(in bed in chair mode) Nurse Communication: Mobility status PT Visit Diagnosis: Other abnormalities of gait and mobility (R26.89);Muscle weakness (generalized) (M62.81);Other symptoms and signs involving the nervous system (R29.898);Hemiplegia and hemiparesis Hemiplegia - Right/Left: Right Hemiplegia - dominant/non-dominant: Dominant Hemiplegia - caused by: Cerebral infarction     Time: 6761-9509 PT Time Calculation (min) (ACUTE ONLY): 26 min  Charges:  $Therapeutic Activity: 23-37 mins                    Hadley Soileau B. Shamir Sedlar, PT, DPT  Acute Rehabilitation [phone] pager #[phone] office   08/28/2018, 7:34 PM

## 2018-08-28 NOTE — Care Management Note (Signed)
Case Management Note  Patient Details  Name: Andrea [name] MRN: [id number] Date of Birth: Jan 27, 1961  Subjective/Objective:  57 yo presented to ED with chest pain noted Rt hemiparesis in ED with acute Left paramedian brainstem infarct. Intubated 10/10 for angioplasty, extubated post procedure and reintubated. Repeat MRI with brainstem infarct extension and bil cerebellar infarcts. Peg/trach 08/16/2018.  PTA, pt independent and living with children.                 Action/Plan: Pt s/p trach and PEG on 08/08/2018.  She has now weaned to trach collar.  Will consult CSW to facilitate dc to SNF upon medical stability.  Pt is uninsured, and therefore ineligible for LTAC hospital.  Expected Discharge Date:                  Expected Discharge Plan:  [place]  In-House Referral:  Clinical Social Work  Discharge planning Services  CM Consult  Post Acute Care Choice:    Choice offered to:     DME Arranged:    DME Agency:     HH Arranged:    [place] Agency:     Status of Service:  In process, will continue to follow  If discussed at Long Length of Stay Meetings, dates discussed:    Additional Comments:  Reinaldo Raddle, RN, BSN  Trauma/Neuro ICU Case Manager [phone]

## 2018-08-29 LAB — CBC
HCT: 23.8 % — ABNORMAL LOW (ref 36.0–46.0)
Hemoglobin: 7 g/dL — ABNORMAL LOW (ref 12.0–15.0)
MCH: 23.2 pg — ABNORMAL LOW (ref 26.0–34.0)
MCHC: 29.4 g/dL — AB (ref 30.0–36.0)
MCV: 78.8 fL — AB (ref 80.0–100.0)
NRBC: 0.3 % — AB (ref 0.0–0.2)
Platelets: 489 10*3/uL — ABNORMAL HIGH (ref 150–400)
RBC: 3.02 MIL/uL — ABNORMAL LOW (ref 3.87–5.11)
RDW: 15.2 % (ref 11.5–15.5)
WBC: 19 10*3/uL — ABNORMAL HIGH (ref 4.0–10.5)

## 2018-08-29 LAB — PHOSPHORUS: Phosphorus: 2.9 mg/dL (ref 2.5–4.6)

## 2018-08-29 LAB — BASIC METABOLIC PANEL
ANION GAP: 9 (ref 5–15)
BUN: 37 mg/dL — ABNORMAL HIGH (ref 6–20)
CALCIUM: 11.1 mg/dL — AB (ref 8.9–10.3)
CO2: 25 mmol/L (ref 22–32)
CREATININE: 1.12 mg/dL — AB (ref 0.44–1.00)
Chloride: 105 mmol/L (ref 98–111)
GFR calc Af Amer: >60 mL/min (ref >60)
GFR, EST NON AFRICAN AMERICAN: 53 mL/min — AB (ref >60)
GLUCOSE: 229 mg/dL — AB (ref 70–99)
Potassium: 4.2 mmol/L (ref 3.5–5.1)
Sodium: 139 mmol/L (ref 135–145)

## 2018-08-29 LAB — MAGNESIUM: Magnesium: 2.2 mg/dL (ref 1.7–2.4)

## 2018-08-29 LAB — GLUCOSE, CAPILLARY
GLUCOSE-CAPILLARY: 223 mg/dL — AB (ref 70–99)
Glucose-Capillary: 196 mg/dL — ABNORMAL HIGH (ref 70–99)
Glucose-Capillary: 202 mg/dL — ABNORMAL HIGH (ref 70–99)
Glucose-Capillary: 225 mg/dL — ABNORMAL HIGH (ref 70–99)
Glucose-Capillary: 235 mg/dL — ABNORMAL HIGH (ref 70–99)
Glucose-Capillary: 250 mg/dL — ABNORMAL HIGH (ref 70–99)

## 2018-08-29 LAB — VANCOMYCIN, TROUGH: VANCOMYCIN TR: 22 ug/mL — AB (ref 15–20)

## 2018-08-29 LAB — PREPARE RBC (CROSSMATCH)

## 2018-08-29 LAB — ABO/RH: ABO/RH(D): B POS

## 2018-08-29 MED ORDER — HYDROCODONE-ACETAMINOPHEN 5-325 MG PO TABS: 1.0000 | ORAL_TABLET | Freq: Four times a day (QID) | ORAL | Status: DC | PRN

## 2018-08-29 MED ORDER — VANCOMYCIN HCL IN DEXTROSE 750-5 MG/150ML-% IV SOLN: 750.0000 mg | Freq: Two times a day (BID) | INTRAVENOUS | Status: DC

## 2018-08-29 MED ORDER — FUROSEMIDE 10 MG/ML IJ SOLN
20.0000 mg | Freq: Once | INTRAMUSCULAR | Status: AC
  Administered 2018-08-29: 20 mg via INTRAVENOUS
  Filled 2018-08-29: qty 2

## 2018-08-29 MED ORDER — DIPHENHYDRAMINE-ZINC ACETATE 2-0.1 % EX CREA
TOPICAL_CREAM | Freq: Two times a day (BID) | CUTANEOUS | Status: DC | PRN
  Administered 2018-08-29: 1 via TOPICAL
  Administered 2018-09-14 – 2018-10-16 (×5): via TOPICAL
  Administered 2018-10-17: 1 via TOPICAL
  Administered 2018-10-20 – 2018-11-08 (×2): via TOPICAL
  Filled 2018-08-29: qty 28

## 2018-08-29 MED ORDER — FREE WATER
200.0000 mL | Freq: Three times a day (TID) | Status: DC
  Administered 2018-08-29 – 2018-09-05 (×19): 200 mL

## 2018-08-29 MED ORDER — SODIUM CHLORIDE 0.9% IV SOLUTION
Freq: Once | INTRAVENOUS | Status: AC
  Administered 2018-08-29: 13:00:00 via INTRAVENOUS

## 2018-08-29 MED ORDER — INSULIN GLARGINE 100 UNIT/ML ~~LOC~~ SOLN
18.0000 [IU] | Freq: Two times a day (BID) | SUBCUTANEOUS | Status: DC
  Administered 2018-08-29 – 2018-08-30 (×2): 18 [IU] via SUBCUTANEOUS
  Filled 2018-08-29 (×2): qty 0.18

## 2018-08-29 MED ORDER — INSULIN ASPART 100 UNIT/ML ~~LOC~~ SOLN
6.0000 [IU] | SUBCUTANEOUS | Status: DC
  Administered 2018-08-29 – 2018-08-30 (×7): 6 [IU] via SUBCUTANEOUS

## 2018-08-29 MED ORDER — LABETALOL HCL 5 MG/ML IV SOLN
10.0000 mg | INTRAVENOUS | Status: AC | PRN
  Administered 2018-08-29 – 2018-08-30 (×2): 10 mg via INTRAVENOUS
  Filled 2018-08-29 (×2): qty 4

## 2018-08-29 MED ORDER — SODIUM CHLORIDE 0.9 % IV SOLN
2.0000 g | Freq: Three times a day (TID) | INTRAVENOUS | Status: DC
  Administered 2018-08-29 – 2018-09-04 (×17): 2 g via INTRAVENOUS
  Filled 2018-08-29 (×19): qty 2

## 2018-08-29 MED ORDER — ACETAMINOPHEN 160 MG/5ML PO SOLN
650.0000 mg | ORAL | Status: DC | PRN
  Administered 2018-08-30 – 2018-11-13 (×47): 650 mg
  Filled 2018-08-29 (×49): qty 20.3

## 2018-08-29 MED ORDER — HYDROCODONE-ACETAMINOPHEN 7.5-325 MG/15ML PO SOLN
10.0000 mL | Freq: Four times a day (QID) | ORAL | Status: DC | PRN
  Administered 2018-08-29 – 2018-09-18 (×36): 10 mL via ORAL
  Filled 2018-08-29 (×37): qty 15

## 2018-08-29 MED ORDER — VANCOMYCIN HCL IN DEXTROSE 1-5 GM/200ML-% IV SOLN
1000.0000 mg | Freq: Two times a day (BID) | INTRAVENOUS | Status: DC
  Administered 2018-08-29: 1000 mg via INTRAVENOUS
  Filled 2018-08-29 (×2): qty 200

## 2018-08-29 NOTE — NC FL2 (Signed)
[place] LEVEL OF CARE SCREENING TOOL     IDENTIFICATION  Patient Name: Andrea [name] Birthdate: 1960-12-20 Sex: female Admission Date (Current Location): 08/15/2018  [facility] and [place] Number:  Herbalist and Address:  [facility], [place] [address], [place], [place] [address]      Provider Number: [id number]  Attending Physician Name and Address:  Rosalin Hawking, MD  Relative Name and Phone Number:  Dondra Spry, sister, [phone]    Current Level of Care: Hospital Recommended Level of Care: [place] Prior Approval Number:    Date Approved/Denied:   PASRR Number: [id number]  Discharge Plan: SNF    Current Diagnoses: Patient Active Problem List   Diagnosis Date Noted  . Acute respiratory failure ([place])   . Tracheostomy present ([place])   . Tracheostomy status ([place])   . Hypertensive urgency 08/26/2018  . CKD (chronic kidney disease), stage III ([place]) 08/21/2018  . Acute ischemic stroke ([place]) 08/10/2018  . Basilar artery stenosis with infarction ([place]) 08/07/2018  . Respiratory insufficiency   . Low back pain 01/29/2017  . Cholelithiasis without cholecystitis 01/18/2017  . Anemia, iron deficiency 01/17/2017  . Abdominal pain   . Hyperglycemia   . Increased anion gap metabolic acidosis   . Weight loss 04/09/2016  . Headache 04/09/2016  . Fibroids 04/14/2014  . Abnormal uterine bleeding (AUB) 04/14/2014  . Unequal leg length 04/16/2013  . Lightheadedness 12/17/2012  . Chest pain 12/14/2012  . Morbid obesity ([place]) 12/29/2011  . High cholesterol   . 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 ANXIETY 02/20/2010  . GERD 01/30/2010  . THYROID NODULE 01/17/2010  . THYROMEGALY 01/16/2010  . DEPRESSION 01/16/2010  . Insulin-requiring or dependent type II diabetes mellitus ([place]) 09/18/2007  . Essential hypertension, benign 09/18/2007    Orientation RESPIRATION BLADDER Height & Weight     Self  Tracheostomy(FiO2: 28% uncuffed) Incontinent, Indwelling catheter Weight: 128 kg Height:  5\' 4"  (162.6 cm)  BEHAVIORAL SYMPTOMS/MOOD NEUROLOGICAL BOWEL NUTRITION STATUS      Incontinent Feeding tube  AMBULATORY STATUS COMMUNICATION OF NEEDS Skin   Extensive Assist Verbally PU Stage and Appropriate Care(Pressure injury on vagina)                       Personal Care Assistance Level of Assistance  Bathing, Feeding, Dressing Bathing Assistance: Maximum assistance Feeding assistance: Maximum assistance Dressing Assistance: Maximum assistance     Functional Limitations Info  Sight, Hearing, Speech Sight Info: Adequate Hearing Info: Adequate Speech Info: Impaired(Tracheostomy)    SPECIAL CARE FACTORS FREQUENCY  PT (By licensed PT), OT (By licensed OT), Speech therapy     PT Frequency: 5x/week OT Frequency: 3x/week     Speech Therapy Frequency: 3x/week      Contractures Contractures Info: Not present    Additional Factors Info  Code Status, Allergies, Insulin Sliding Scale Code Status Info: Full Allergies Info: Tramadol   Insulin Sliding Scale Info: Every 4 hours       Current Medications (08/29/2018):  This is the current hospital active medication list Current Facility-Administered Medications  Medication Dose Route Frequency Provider Last Rate Last Dose  . 0.9 %  sodium chloride infusion (Manually program via Guardrails IV Fluids)   Intravenous Once Rosalin Hawking, MD      . 0.9 %  sodium chloride infusion   Intravenous PRN Erick Colace, NP   Stopped at 08/28/18 [id number]  . 0.9 %  sodium chloride infusion  Intravenous Continuous Rosalin Hawking, MD   Stopped at 08/29/18 0330  . acetaminophen (TYLENOL) tablet 650 mg  650 mg Per Tube Q4H PRN Rosalin Hawking, MD   650 mg at 08/29/18 0342  . albuterol (PROVENTIL) (2.5 MG/3ML) 0.083% nebulizer solution 2.5 mg  2.5 mg Nebulization Q3H PRN Shearon Stalls, Rahul P, PA-C   2.5 mg at 08/18/18 0532  . amLODipine (NORVASC) tablet 10 mg  10 mg  Per Tube Daily Rosalin Hawking, MD   10 mg at 08/29/18 0924  . aspirin chewable tablet 81 mg  81 mg Oral Daily Deveshwar, Willaim Rayas, MD   81 mg at 08/24/18 9485   Or  . aspirin chewable tablet 81 mg  81 mg Per Tube Daily Luanne Bras, MD   81 mg at 08/29/18 0924  . atorvastatin (LIPITOR) tablet 80 mg  80 mg Per Tube q1800 Rayburn, Kelly A, PA-C   80 mg at 08/28/18 1652  . bisacodyl (DULCOLAX) suppository 10 mg  10 mg Rectal Daily PRN Collene Gobble, MD      . carvedilol (COREG) tablet 25 mg  25 mg Per Tube BID WC Rayburn, Kelly A, PA-C   25 mg at 08/29/18 0923  . cefTAZidime (FORTAZ) 2 g in sodium chloride 0.9 % 100 mL IVPB  2 g Intravenous Q8H Mancheril, Darnell Level, RPH      . chlorhexidine (PERIDEX) 0.12 % solution 15 mL  15 mL Mouth Rinse BID Erick Colace, NP   15 mL at 08/29/18 0923  . docusate (COLACE) 50 MG/5ML liquid 100 mg  100 mg Per Tube Daily Collene Gobble, MD   100 mg at 08/29/18 0924  . feeding supplement (GLUCERNA 1.2 CAL) liquid 1,000 mL  1,000 mL Per Tube Continuous Rush Farmer, MD 45 mL/hr at 08/28/18 2143 1,000 mL at 08/28/18 2143  . feeding supplement (PRO-STAT SUGAR FREE 64) liquid 60 mL  60 mL Per Tube BID Rush Farmer, MD   60 mL at 08/29/18 0923  . ferrous sulfate tablet 325 mg  325 mg Oral TID Rosalin Hawking, MD   325 mg at 08/29/18 4627  . furosemide (LASIX) injection 20 mg  20 mg Intravenous Once Rosalin Hawking, MD      . hydrALAZINE (APRESOLINE) injection 10-40 mg  10-40 mg Intravenous Q4H PRN Shearon Stalls, Rahul P, PA-C   20 mg at 08/20/18 0743  . hydrALAZINE (APRESOLINE) tablet 25 mg  25 mg Oral Q8H Rosalin Hawking, MD   25 mg at 08/29/18 0543  . hydrochlorothiazide 10 mg/mL oral suspension 25 mg  25 mg Per Tube Daily Erick Colace, NP   25 mg at 08/29/18 1218  . HYDROcodone-acetaminophen (NORCO/VICODIN) 5-325 MG per tablet 1 tablet  1 tablet Per Tube Q4H PRN Rayburn, Kelly A, PA-C   1 tablet at 08/29/18 0924  . insulin aspart (novoLOG) injection 0-20 Units  0-20 Units  Subcutaneous Q4H Jennelle Human B, NP   7 Units at 08/29/18 1218  . insulin aspart (novoLOG) injection 6 Units  6 Units Subcutaneous Q4H Rosalin Hawking, MD   6 Units at 08/29/18 1218  . insulin glargine (LANTUS) injection 18 Units  18 Units Subcutaneous BID Rosalin Hawking, MD      . liver oil-zinc oxide (DESITIN) 40 % ointment   Topical QID Rosalin Hawking, MD      . MEDLINE mouth rinse  15 mL Mouth Rinse q12n4p Erick Colace, NP   15 mL at 08/29/18 1221  . megestrol (MEGACE) tablet 80 mg  80 mg Oral BID Rosalin Hawking, MD   80 mg at 08/29/18 0930  . multivitamin liquid 15 mL  15 mL Per Tube Daily Kipp Brood, MD   15 mL at 08/29/18 0923  . ondansetron (ZOFRAN) injection 4 mg  4 mg Intravenous Q6H PRN Opyd, Ilene Qua, MD      . pantoprazole sodium (PROTONIX) 40 mg/20 mL oral suspension 40 mg  40 mg Per Tube Daily Garvin Fila, MD   40 mg at 08/29/18 0923  . sennosides (SENOKOT) 8.8 MG/5ML syrup 5 mL  5 mL Per Tube QHS PRN Collene Gobble, MD      . ticagrelor [facility]) tablet 90 mg  90 mg Per Tube BID Rosalin Hawking, MD   90 mg at 08/29/18 0924  . vancomycin (VANCOCIN) IVPB 750 mg/150 ml premix  750 mg Intravenous Q12H Mancheril, Darnell Level, [facility]         Discharge Medications: Please see discharge summary for a list of discharge medications.  Relevant Imaging Results:  Relevant Lab Results:   Additional Information SSN: [place], [place]

## 2018-08-29 NOTE — Progress Notes (Signed)
Paged on call neuro MD regarding rash on forehead.  Orders for topical cream given.  Paged on call neuro MD regarding BP above 130/150 parameters.  New prn BP med ordered and given.  Effective.

## 2018-08-29 NOTE — Progress Notes (Signed)
STROKE TEAM PROGRESS NOTE   SUBJECTIVE (INTERVAL HISTORY) Pt RN is at the bedside.  Pt is sitting in converted bed. Still has quadriplegia with some improvement on the left. Secretions improved. However, Hb 7.0 and will need PRBC transfusion today. Na 139, continue NS. Glucose still high, will increase insulin dose.   OBJECTIVE Vitals:   08/29/18 1600 08/29/18 1615 08/29/18 1621 08/29/18 1638  BP: (!) 150/72 (!) 142/65  (!) 144/68  Pulse: 82 82  85  Resp: (!) 23 (!) 40 16 (!) 35  Temp:  99.5 F (37.5 C)  99.8 F (37.7 C)  TempSrc:  Oral  Oral  SpO2: 97% 97%  97%  Weight:      Height:        CBC:  Recent Labs  Lab 08/28/18 0357 08/29/18 0614  WBC 19.4* 19.0*  HGB 7.7* 7.0*  HCT 25.9* 23.8*  MCV 78.2* 78.8*  PLT 475* 489*    Basic Metabolic Panel:  Recent Labs  Lab 08/28/18 0357 08/29/18 0614  NA 137 139  K 3.3* 4.2  CL 104 105  CO2 25 25  GLUCOSE 219* 229*  BUN 33* 37*  CREATININE 1.06* 1.12*  CALCIUM 10.9* 11.1*  MG  --  2.2  PHOS  --  2.9    Lipid Panel:     Component Value Date/Time   CHOL 256 (H) 08/15/2018 0512   TRIG 248 (H) 08/15/2018 0512   HDL 35 (L) 08/15/2018 0512   CHOLHDL 7.3 08/15/2018 0512   VLDL 50 (H) 08/15/2018 0512   LDLCALC 171 (H) 08/15/2018 0512   HgbA1c:  Lab Results  Component Value Date   HGBA1C 11.4 (H) 08/15/2018   Urine Drug Screen:     Component Value Date/Time   LABOPIA NONE DETECTED 09/02/2018 1836   COCAINSCRNUR NONE DETECTED 08/16/2018 1836   LABBENZ NONE DETECTED 08/07/2018 1836   AMPHETMU NONE DETECTED 08/21/2018 1836   THCU NONE DETECTED 08/10/2018 1836   LABBARB NONE DETECTED 08/09/2018 1836    Alcohol Level No results found for: ETH  IMAGING  Ct Angio Head W Or Wo Contrast Ct Angio Neck W Or Wo Contrast 08/15/2018 IMPRESSION:  1. Interval basilar to left proximal PCA stenting. The density of the stent walls precludes detection of in stent stenosis; there is a degree of wasting at the mid basilar  segment. There is flow in the left PCA beyond the stent such that there is presumed stent patency.  2. Known lower pontine infarct. There are new small cerebellar infarcts since brain MRI yesterday.  3. Severe left P2 segment stenosis, also seen previously.  4. Moderate atheromatous narrowing in the proximal left MCA.    Ct Angio Head W Or Wo Contrast Ct Angio Neck W Or Wo Contrast 08/19/2018 IMPRESSION:  1. 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 No definite acute thrombus identified.  2. Severe stenosis left posterior cerebral artery and moderate stenosis right posterior cerebral artery.  3. Mild stenosis left MCA and moderate stenosis left MCA bifurcation.  4. No significant carotid or vertebral artery stenosis in the neck.    Ct Head Wo Contrast 08/29/2018 IMPRESSION:  No acute intracranial abnormalities. Mild white matter changes likely due to small vessel ischemia.    Mr Brain Wo Contrast 08/30/2018 IMPRESSION:  1. Acute nonhemorrhagic infarct involving the left paramedian brainstem. The infarct crosses midline.  2. Other periventricular and subcortical white matter disease is moderately advanced for age. This  likely reflects the sequela of chronic microvascular ischemia.  3. Tapering of the dens with prominent soft tissue pannus. 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    Ct Head Code Stroke Wo Contrast 08/13/2018  IMPRESSION:  1. No acute abnormality and no change from earlier today  2. ASPECTS is 10 3.    Cerebral Angiogram 09/03/2018 S/P 4 vessel cerebral arteriogram RT CFA approach. 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 S/P stent assisted angioplasty of distal basilar artery with patency of 80 to 90 %. 2.Approx 70 % stenosis of LT ICA supraclinoid seg   MRI 08/15/18 1. Progressive  acute pontine infarct. New patchy bilateral cerebellar infarction. New tiny left thalamic infarcts. 2. Preserved flow void in the stented basilar. 3. left facial/submandibular edema, please correlate with neck exam. Edited result: IMPRESSION: 1. Progressive acute pontine infarct. New patchy bilateral cerebellar infarction. New tiny left thalamic infarcts. 2. Preserved flow void in the stented basilar.  TTE - Left ventricle: The cavity size was normal. There was severe   concentric hypertrophy. Systolic function was normal. The   estimated ejection fraction was in the range of 60% to 65%. Wall   motion was normal; there were no regional wall motion   abnormalities. Doppler parameters are consistent with abnormal   left ventricular relaxation (grade 1 diastolic dysfunction). The   E/e&' ratio is <8, suggesting normal LV filling pressure. - Aortic valve: Trileaflet; mildly calcified leaflets.   Transvalvular velocity was minimally increased. There was no   regurgitation. Mean gradient (S): 12 mm Hg. - Mitral valve: Mildly thickened leaflets . There was trivial   regurgitation. - Left atrium: The atrium was normal in size. - Inferior vena cava: The vessel was dilated. The respirophasic   diameter changes were blunted (< 50%), consistent with elevated   central venous pressure. Impressions: - LVEF 60-65%, severe LVH, normal wall motion, grade 1 DD, normal   LV filling pressure, minimally increased aortic velocity without   signficant stenosis, trivial MR, normal LA size, dilated IVC.  Dg Chest Port 1 View  Result Date: 08/23/2018 CLINICAL DATA:  Encounter for fever. EXAM: PORTABLE CHEST 1 VIEW COMPARISON:  08/19/2018 FINDINGS: Endotracheal tube is 3.6 cm above the carina. Airspace densities in the right hilum and right lower lung have markedly decreased and possibly resolved. There may be a small amount of residual disease in the medial right lower lung. Vascular crowding in the hilar  regions. Low lung volumes. Heart size is grossly stable and within normal limits. Nasogastric tube extends into the abdomen. Negative for pneumothorax. IMPRESSION: Airspace disease in the right lung has markedly decreased. Low lung volumes. Support apparatuses as described. Electronically Signed   By: Markus Daft M.D.   On: 08/23/2018 11:07    PHYSICAL EXAM  Temp:  [98.2 F (36.8 C)-100.2 F (37.9 C)] 99.8 F (37.7 C) (10/25 1638) Pulse Rate:  [74-93] 85 (10/25 1638) Resp:  [13-40] 35 (10/25 1638) BP: (119-152)/(62-83) 144/68 (10/25 1638) SpO2:  [96 %-100 %] 97 % (10/25 1638) FiO2 (%):  [28 %] 28 % (10/25 1621) Weight:  [948 kg] 128 kg (10/25 0500)  General -  Obese middle-aged African-American lady, not in distress.    Ophthalmologic - fundi not visualized due to noncooperation.  Cardiovascular - Regular rate and rhythm.  Neuro - post trach, on trach collar, off sedation. Eyes open, awake, alert. No ptosis or EOMI,  denies diplopia. PERRL. blinking to visual threat bilaterally. Right gaze nystagmus is improved but still persistent. Bilateral facial weakness, R>L, corneal, gag and cough present. Able to open mouth slightly but not able to protrude tongue. Quadriplegia, with left LE 2-/5 proximal and 0/5 distal. DTR diminished. Sensation not cooperative, coordination and gait not tested.   ASSESSMENT/PLAN Andrea [name] is a 57 y.o. female with history of difficult to control hypertension, insulin-dependent diabetes, obesity, hypothyroidism status post ablation presenting with chest discomfort, right-sided weakness, slurred speech and right facial droop. She did not receive IV t-PA due to late presentation. S/P stent assisted angioplasty of distal basilar artery.  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 Worsening symptoms with extension of pontine/medullary infarcts with new small bilateral cerebellar infarcts without evidence of stent  re-stenosis or occlusion  Resultant b// UE and LE weakness, eyes minimally disconjugate but improving, nystagmus  CT head - No acute intracranial abnormalities.  CTA H&N 08/18/2018 - Extensive atherosclerotic disease in the basilar with focal critical stenosis in the mid to distal basilar. No definite acute thrombus identified. Severe stenosis left posterior cerebral artery  MRI head - Acute nonhemorrhagic infarct involving the left paramedian brainstem.   CTA H&N 08/15/2018 - stent too dense to see BA lumen, but distal flow preserved. New small cerebellar infarcts since brain MRI yesterday.  Severe left P2 segment stenosis, also seen previously.   Repeat MRI - extension of b/l pontine and upper medullary infarcts with new b/l cerebellar infarcts.  2D Echo - EF 60-65%  LDL - 174  HgbA1c - 11.1  VTE prophylaxis - heparin subq  aspirin 81 mg daily prior to admission, now resumed on Brilinta 90 mg bid and ASA 81 mg daily.   Patient counseled to be compliant with her antithrombotic medications  Ongoing aggressive stroke risk factor management  Therapy recommendations:  LTACH  Disposition:  Pending  Intracranial stenosis  BA mid to distal severe stenosis s/p BA stenting  Left PCA severe stenosis, R PCA moderate stenosis  Left MCA moderate stenosis  Uncontrolled stroke risk factors with HLD, HTN, DM  Non compliance with meds at home  Respiratory failure  S/p trach 08/14/2018  On trach collar now, tolerating well  Still has secretions, improved  CCM following with trach intermittently  Hypertension  Stable  BP goal 130-150   On po norvasc, losartan  Metoprolol changed to Coreg 25 bid, also on HCTZ 25mg  now  Also on low dose hydralazine 25mg  tid  Hyperlipidemia  Lipid lowering medication PTA:  Lipitor 20 mg daily  LDL 174, goal < 70  Increase to 80 mg daily  Continue statin at discharge  Diabetes  HgbA1c 11.4, goal <  7.0  Uncontrolled  Hyperglycemia  Lantus increase from 15U bid to 18U bid   Continue basal NovoLog increase from 4U Q4h to 6U Q4h  SSI  CBG monitoring  Low grade fever with leukocytosis  Tmax 100.7 -> afebrile->100.7->101-> afebrile   Leukocytosis - 12.7->12.9->14.4->14.3->16.1->20.6->19.4->19.0  UA neg for UTI  CXR unremarkable, improving from prior  Sputum culture normal flora  On vanco and fortaz for empiric treatment - will be d/c on 08/28/2018  CCM signed off  Hypernatremia and elevated Cre/BUN  Na 156->156->151->148->146->141->137->139  Cre 1.41->1.40->1.22->1.16->1.22->1.18->1.06->1.12  Put on NS @ 50  On TF @ 45  Add free water 200mg  Q8h  Continue BMP monitoring  Dysphagia s/p PEG  Continue tube feeding 45 cc/h  PEG done 08/20/2018  Anemia  Hb 11.1->9.7->9.4->9.3->9.4->8.8->8.2->7.9->7.7->7.0  Likely due to iron deficiency and acute uterine bleeding  Iron panel showed iron deficiency  Put on iron pills  PRBC transfusion 2 U today  CBC monitoring  Abnormal uterine bleeding  Has been following with GYN  Was on megace in the past  Will resume megace 80mg  bid  Continue ASA and brilinta for now  Vaginal bleeding much improved as per RN  Close CBC monitoring  Other Stroke Risk Factors  Obesity, Body mass index is 48.44 kg/m., recommend weight loss, diet and exercise as appropriate   Other Active Problems  Hypokalemia - on supplement for K 3.3->4.2  Hospital day # 15  This patient is critically ill due to worsening brainstem infarct, BA stenosis s/p stenting, hyperglycemia, hypertensive, fever, anemia and hypernatremia and at significant risk of neurological worsening, death form recurrent stroke, hemorrhagic conversion, CHF, seizure, aspiration, hemorrhagic shock. This patient's care requires constant monitoring of vital signs, hemodynamics, respiratory and cardiac monitoring, review of multiple databases, neurological  assessment, discussion with family, other specialists and medical decision making of high complexity. I spent 35 minutes of neurocritical care time in the care of this patient.  Rosalin Hawking, MD PhD Stroke Neurology 08/29/2018 5:26 PM     To contact Stroke Continuity provider, please refer to [email or web address] After hours, contact General Neurology

## 2018-08-29 NOTE — Progress Notes (Signed)
Inpatient Diabetes Program Recommendations  AACE/ADA: New Consensus Statement on Inpatient Glycemic Control (2015)  Target Ranges:  Prepandial:   less than 140 mg/dL      Peak postprandial:   less than 180 mg/dL (1-2 hours)      Critically ill patients:  140 - 180 mg/dL   Lab Results  Component Value Date   GLUCAP 196 (H) 08/29/2018   HGBA1C 11.4 (H) 08/15/2018    Review of Glycemic Control Results for Andrea [name], Andrea [name] (MRN [id number]) as of 08/29/2018 10:46  Ref. Range 08/28/2018 16:00 08/28/2018 19:46 08/28/2018 23:30 08/29/2018 03:26 08/29/2018 08:31  Glucose-Capillary Latest Ref Range: 70 - 99 mg/dL 228 (H) 213 (H) 187 (H) 223 (H) 196 (H)   Diabetes history: Type 2 DM  Outpatient Diabetes medications:  Lantus 27 units q HS, Metformin 1000 mg bid Current orders for Inpatient glycemic control:  Novolog resistant q 4 hours, Lantus 15 units bid, Novolog 4 units q 4 hours Glucerna 45 cc/ hr Inpatient Diabetes Program Recommendations:   Please consider increasing Novolog tube feed coverage to 6 units q 4 hours.   Thanks,  Adah Perl, RN, BC-ADM Inpatient Diabetes Coordinator Pager [phone] (8a-5p)

## 2018-08-29 NOTE — Procedures (Signed)
First trach change  Sutures removed, trach cuff deflated and removed over a changer then a size 4 cuffless trach placed.  Verified by auscultation and color change.  Rush Farmer, M.D. [facility] Pulmonary/Critical Care Medicine. Pager: [phone]. After hours pager: [phone].

## 2018-08-29 NOTE — Progress Notes (Addendum)
Pharmacy Antibiotic Note  Andrea [name] is a 57 y.o. female admitted on 09/02/2018 with an acute ischemic stroke. She currently trach-dependent and has a PEG. Patient is on vancomycin and ceftazidime due to concern for HCAP. WBC remains elevated at 19 and she spiked a fever yesterday. A 10.5 hr vanc trough was 22 today. Patient remains culture negative so far    Plan: -Increase ceftazidime to 2 gm IV Q 8 hours  -Decrease vancomycin to 750 mg IV Q 12 hours  -Monitor renal fx, cultures, VT at steady state  Height: 5\' 4"  (162.6 cm) Weight: 282 lb 3 oz (128 kg) IBW/kg (Calculated) : 54.7  Temp (24hrs), Avg:99.9 F (37.7 C), Min:98.2 F (36.8 C), Max:100.5 F (38.1 C)  Recent Labs  Lab 08/21/2018 0646 08/26/18 0406 08/27/18 0354 08/28/18 0357 08/29/18 0614 08/29/18 1031  WBC 14.3* 16.1* 20.6* 19.4* 19.0*  --   CREATININE 1.16* 1.22* 1.18* 1.06* 1.12*  --   VANCOTROUGH  --   --   --   --   --  22*      Antimicrobials this admission: 10/22 vancomycin > 10/22 ceftazidime >  Dose adjustments this admission: N/A  Microbiology results: 10/10 mrsa pcr: neg 10/21 TA: normal flora  10/23 BCx>> ngtd   Albertina Parr, PharmD., BCPS Clinical Pharmacist Clinical phone for 08/29/18 until 3:30pm: [phone] If after 3:30pm, please refer to [facility] for unit-specific pharmacist   Addendum: RN called to inform that vancomycin dose was infusing when lab was drawn. Today's vancomycin level is likely not representative of a trough. Will increase vancomycin back to 1 gm IV Q 12 hours and re-draw a vanc trough tomorrow.   Albertina Parr, PharmD., BCPS Clinical Pharmacist

## 2018-08-29 NOTE — Progress Notes (Signed)
NAME:  Andrea [name], MRN:  [id number], DOB:  06/30/1961, LOS: 5 ADMISSION DATE:  08/10/2018, CONSULTATION DATE:  08/21/2018 REFERRING MD:  Dr. Rory Percy, CHIEF COMPLAINT:  CP/ Acute CVA  Brief History   59 yoF w/poorly controlled HTN and IDDM presenting with CP and left arm pain w/BP 220/117. Initial CTH neg.  Cardiac workup negative for acute event thus far, cardiology following.  Code stroke initiated for R hemiplegia, dysarthria and right facial droop.  Out of window for TPA.  Requiring cleviprex for BP control.  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  Evolving pontine stroke on MRI. Intubated for neuro IR s/p stenting and angioplasty with 80-90% patency.  Extubated post procedure but due to insufficency placed on BiPAP and brought to ICU.    Past Medical History  HTN, IDDM, hypothyroidism s/p ablation  Significant Hospital Events   10/9 present to [facility] ED -transferred to [facility] 10/9 basilar artery stenting 10/9 intubated urgently 6h post procedure for inability to protect airway. 10/10 CTA for declining exam - evolving pontine infarct, stent deemed to be patent 10/14 Brillinta stopped and ASA/ heparin started to in anticipation of tracheostomy on Monday. 10/14 through 10/19: Neuro exam has been stable sodium increasing in spite of free water via tube, providing supportive care awaiting trach 10/20: Sodium down to 151 from 156 clinically looks the same 10/21: Sodium down to 148, sputum culture sent for purulent tracheal secretions, awaiting trach and PEG.  Purulent bronchial secretions from right upper lobe sent for BAL 10/22: Tracheostomy and PEG completed on the 21st tolerated well.  Spiking low-grade fever white cells continued to climb starting empiric vancomycin and ceftaz.  Sodium improved down to 146.  Blood pressure improving but still not at goal added hydrochlorothiazide.  Resumed Brilinta.  10/23: Sodium normal.  Backing down on free water.  Glycemic control  challenging, increased basal NovoLog in addition to Lantus and sliding scale.  Still spiking fever, white blood cell count climbing, sending blood cultures.  Foley catheter being placed for pressure ulcer caused by pure wick device   Consults: date of consult/date signed off & final recs:  Cards 10/10 Neurology 10/10 PCCM 10/10  Procedures (surgical and bedside):  10/10 Neuro IR >> s/p stent and angioplasty of distal basilar artery  10/10 ETT for procedure 10/10 ETT (reintubated) >> removed 10/21: Size 6 tracheostomy placed by Dr. Nelda Marseille  Significant Diagnostic Tests:  08/30/2018  CTH >>  No acute intracranial abnormalities. Mild white matter changes likely due to small vessel ischemia  08/1018 CTH >> 1. No acute abnormality and no change from earlier today 2. ASPECTS is 10  08/12/2018  CTA head and neck >>  1. 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 No definite acute thrombus identified. 2. Severe stenosis left posterior cerebral artery and moderate stenosis right posterior cerebral artery. 3. Mild stenosis left MCA and moderate stenosis left MCA bifurcation. 4. No significant carotid or vertebral artery stenosis in the neck.  09/03/2018 MRI - evolving pontine infarct.  08/15/18  Echocardiogram - normal LVSF with severe LVH.   Micro Data:  08/31/2018 MRSA PCR >>negative 10/21: Sputum culture: Abundant white blood cells, moderate GPC moderate G PR rare GNR>>> 10/23: Blood cultures x2>>>  Antimicrobials:  10/10 cefazolin preop 10/22: Vancomycin>>> 10/22: Ceftaz>>  Subjective:  No events overnight No new complaints  Objective   Blood pressure 129/67, pulse 80, temperature 100 F (37.8 C), temperature  source Axillary, resp. rate (!) 27, height _0  (1.626 m), weight 128 kg, last menstrual period 07/26/2018, SpO2 99 %.    FiO2 (%):  [28 %] 28 %   Intake/Output Summary (Last 24 hours) at 08/29/2018 1120 Last  data filed at 08/29/2018 0545 Gross per 24 hour  Intake 2463.57 ml  Output 2275 ml  Net 188.57 ml   Filed Weights   08/27/18 0135 08/28/18 0500 08/29/18 0500  Weight: 124.6 kg 128 kg 128 kg    Examination: General Chronically ill appearing female, NAD on TC HEENT: [place]/AT, PERRL, EOM-I and MMM Pulmonary: Coarse BS diffusely Cardiac: RRR, Nl S1/S2 and -M/R/G Abdomen: Soft, NT, ND and +BS Extremities: -edema and -tenderness Neuro: Nods and interacts appropriately minimal movement, can wiggle toes on left only  I reviewed CXR myself, trach is in a good position  Resolved Hospital Problem list    Assessment & Plan:   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   Swallowing dysfunction is not expected to improve sufficiently to permit extubation so tracheostomy and PEG tube are planned for Monday 10/21 Plan Continue ASA and Brilinta  SNF placement May move out of the ICU  Respiratory Insufficiency due to inability to protect airway from dysarthria.  Expected with location of stroke.  Portable chest x-ray status post tracheostomy personally reviewed  From 10/21:, Tracheostomy is in satisfactory position, cardiomegaly, left basilar airspace disease -Now on trach collar x24 hours appears comfortable Plan D/C vent from room Change trach today to a cuffless 6 to aid with speech therapy Mobilize Abx as below  Increased tracheal secretions, nasal discharge, and leukocytosis -Worrisome for sinusitis and for tracheobronchitis: BAL sent from right upper lobe on 10/21, still pending -Spiking fevers last night Plan Ceftazidime and vancomycin follow-up cultures, last dose 10/28 F/U on cultures  Hypertensive Crisis Blood pressure much improved following tracheostomy Plan  Continue current dosing of Coreg, hydralazine, Norvasc and  hydrochlorothiazide  Pressure ulcer Stage II on vulva area felt secondary to pure wick device Plan Place Foley catheter to prevent moisture injury and monitor clinically  Type 2 diabetes Plan Continue Lantus at 18 units twice daily Increased basal NovoLog with sliding scale D/C D5W  Hypernatremia, with increased creatinine.  Both normalized with free water replacement  plan Decrease free water to 25 cc an hour Continue free water via tube  Anemia without evidence of bleeding Plan Trend CBC  Disposition / Summary of Today's Plan 08/29/18    Discussed with PCCM-NP  PCCM will see again on Monday  Best Practice  Diet: TF Pain/Anxiety/Delirium protocol (if indicated): RASS goal 0 effective 10/22 VAP protocol (if indicated): Ordered DVT prophylaxis: SCDs  GI prophylaxis: Famotidine Hyperglycemia protocol: SSI, lantus Mobility: Out of bed Code Status: Full  Family Communication: Sister updated at bedside 10/21  Labs ( personally  Reviewed)  CBC: Recent Labs  Lab 08/08/2018 0646 08/26/18 0406 08/27/18 0354 08/28/18 0357 08/29/18 0614  WBC 14.3* 16.1* 20.6* 19.4* 19.0*  HGB 8.2* 8.2* 7.9* 7.7* 7.0*  HCT 29.5* 28.7* 27.9* 25.9* 23.8*  MCV 81.9 81.8 82.1 78.2* 78.8*  PLT 558* 506* 460* 475* 489*    Basic Metabolic Panel: Recent Labs  Lab 08/18/2018 0646 08/26/18 0406 08/27/18 0354 08/28/18 0357 08/29/18 0614  NA 148* 146* 141 137 139  K 3.6 3.7 3.7 3.3* 4.2  CL 115* 114* 109 104 105  CO2 26  _0 GLUCOSE 174* 217* 253* 219* 229*  BUN 38* 39* 39* 33* 37*  CREATININE 1.16* 1.22* 1.18* 1.06* 1.12*  CALCIUM 11.7* 11.6* 11.2* 10.9* 11.1*  MG  --   --   --   --  2.2  PHOS  --   --   --   --  2.9   GFR: Estimated Creatinine Clearance: 73.5 mL/min (A) (by C-G formula based on SCr of 1.12 mg/dL (H)). Recent Labs  Lab 08/26/18 0406 08/27/18 0354 08/28/18 0357 08/29/18 0614  WBC 16.1* 20.6* 19.4* 19.0*    Liver Function Tests: No results for  input(s): AST, ALT, ALKPHOS, BILITOT, PROT, ALBUMIN in the last 168 hours. No results for input(s): LIPASE, AMYLASE in the last 168 hours. No results for input(s): AMMONIA in the last 168 hours.  ABG    Component Value Date/Time   PHART 7.334 (L) 08/22/2018 2259   PCO2ART 41.9 08/24/2018 2259   PO2ART 275.0 (H) 09/04/2018 2259   HCO3 22.3 08/31/2018 2259   TCO2 24 08/16/2018 2259   ACIDBASEDEF 3.0 (H) 08/17/2018 2259   O2SAT 100.0 08/31/2018 2259     Coagulation Profile: Recent Labs  Lab 08/30/2018 0646  INR 1.20    Cardiac Enzymes: No results for input(s): CKTOTAL, CKMB, CKMBINDEX, TROPONINI in the last 168 hours.  HbA1C: Hgb A1c MFr Bld  Date/Time Value Ref Range Status  08/15/2018 05:12 AM 11.4 (H) 4.8 - 5.6 % Final    Comment:    (NOTE) Pre diabetes:          5.7%-6.4% Diabetes:              >6.4% Glycemic control for   <7.0% adults with diabetes   01/12/2017 02:43 AM 12.8 (H) 4.8 - 5.6 % Final    Comment:    (NOTE)         Pre-diabetes: 5.7 - 6.4         Diabetes: >6.4         Glycemic control for adults with diabetes: <7.0     CBG: Recent Labs  Lab 08/28/18 1600 08/28/18 1946 08/28/18 2330 08/29/18 0326 08/29/18 0831  GLUCAP 228* 213* 187* 223* 196*   Discussed with PCCM-NP and neurology MD  Rush Farmer, M.D. [facility] Pulmonary/Critical Care Medicine. Pager: [phone]. After hours pager: [phone].

## 2018-08-29 NOTE — Progress Notes (Signed)
  Speech Language Pathology Treatment: [name] Speaking valve  Patient Details Name: Andrea [name] MRN: [id number] DOB: 12-05-1960 Today's Date: 08/29/2018 Time: 1210-1220 SLP Time Calculation (min) (ACUTE ONLY): 10 min  Assessment / Plan / Recommendation Clinical Impression  Pt seen just after trach change. At rest vital signs stable, but when SLP woke pt up and briefly placed PMSV, RR increased quite a bit, though audible redirection of air to upper airway heard via humming. Pt could articualte words, but did not coordinate phonation and articulation. Pt seemed fatigued, possible discomfort after trach change. RN and SLP repositioned pt for comfort. Will continue efforts when pts endurance is better.   HPI HPI: 57 yo presented to ED with chest pain noted Rt hemiparesis in ED with acute Left paramedian brainstem infarct. Intubated 10/10 for angioplasty, extubated post procedure and reintubated. Repeat MRI with brainstem infarct extension and bil cerebellar infarcts. Peg/trach 08/28/2018.PMHx: HTN, DM, CKD      SLP Plan  Continue with current plan of care       Recommendations         Patient may use Passy-Muir Speech Valve: with SLP only PMSV Supervision: Full         Plan: Continue with current plan of care       GO               Herbie Baltimore, MA Page Pager [phone] Office [phone]   Andrea [name] 08/29/2018, 2:26 PM

## 2018-08-30 ENCOUNTER — Inpatient Hospital Stay (HOSPITAL_COMMUNITY): Payer: Medicaid Other

## 2018-08-30 DIAGNOSIS — E119 Type 2 diabetes mellitus without complications: Secondary | ICD-10-CM

## 2018-08-30 DIAGNOSIS — Z794 Long term (current) use of insulin: Secondary | ICD-10-CM

## 2018-08-30 LAB — URINALYSIS, ROUTINE W REFLEX MICROSCOPIC
Bilirubin Urine: NEGATIVE
Glucose, UA: NEGATIVE mg/dL
Ketones, ur: NEGATIVE mg/dL
NITRITE: NEGATIVE
PROTEIN: NEGATIVE mg/dL
Specific Gravity, Urine: 1.015 (ref 1.005–1.030)
pH: 5 (ref 5.0–8.0)

## 2018-08-30 LAB — CBC
HEMATOCRIT: 29.7 % — AB (ref 36.0–46.0)
Hemoglobin: 8.8 g/dL — ABNORMAL LOW (ref 12.0–15.0)
MCH: 23.7 pg — ABNORMAL LOW (ref 26.0–34.0)
MCHC: 29.6 g/dL — ABNORMAL LOW (ref 30.0–36.0)
MCV: 80.1 fL (ref 80.0–100.0)
Platelets: 553 10*3/uL — ABNORMAL HIGH (ref 150–400)
RBC: 3.71 MIL/uL — ABNORMAL LOW (ref 3.87–5.11)
RDW: 15.3 % (ref 11.5–15.5)
WBC: 20.8 10*3/uL — ABNORMAL HIGH (ref 4.0–10.5)
nRBC: 0.5 % — ABNORMAL HIGH (ref 0.0–0.2)

## 2018-08-30 LAB — BASIC METABOLIC PANEL
Anion gap: 11 (ref 5–15)
BUN: 37 mg/dL — ABNORMAL HIGH (ref 6–20)
CALCIUM: 11.6 mg/dL — AB (ref 8.9–10.3)
CO2: 25 mmol/L (ref 22–32)
CREATININE: 1.19 mg/dL — AB (ref 0.44–1.00)
Chloride: 102 mmol/L (ref 98–111)
GFR calc Af Amer: 58 mL/min — ABNORMAL LOW (ref >60)
GFR calc non Af Amer: 50 mL/min — ABNORMAL LOW (ref >60)
GLUCOSE: 272 mg/dL — AB (ref 70–99)
Potassium: 4.5 mmol/L (ref 3.5–5.1)
Sodium: 138 mmol/L (ref 135–145)

## 2018-08-30 LAB — TYPE AND SCREEN
ABO/RH(D): B POS
ANTIBODY SCREEN: NEGATIVE
Unit division: 0
Unit division: 0

## 2018-08-30 LAB — BPAM RBC
Blood Product Expiration Date: 201911012359
Blood Product Expiration Date: 201911132359
ISSUE DATE / TIME: 201910251300
ISSUE DATE / TIME: 201910251606
UNIT TYPE AND RH: 7300
Unit Type and Rh: 7300

## 2018-08-30 LAB — VANCOMYCIN, TROUGH: Vancomycin Tr: 26 ug/mL (ref 15–20)

## 2018-08-30 LAB — GLUCOSE, CAPILLARY
GLUCOSE-CAPILLARY: 272 mg/dL — AB (ref 70–99)
GLUCOSE-CAPILLARY: 312 mg/dL — AB (ref 70–99)
GLUCOSE-CAPILLARY: 218 mg/dL — AB (ref 70–99)
Glucose-Capillary: 249 mg/dL — ABNORMAL HIGH (ref 70–99)
Glucose-Capillary: 321 mg/dL — ABNORMAL HIGH (ref 70–99)
Glucose-Capillary: 277 mg/dL — ABNORMAL HIGH (ref 70–99)

## 2018-08-30 MED ORDER — CLEVIDIPINE BUTYRATE 0.5 MG/ML IV EMUL
0.0000 mg/h | INTRAVENOUS | Status: DC
  Administered 2018-08-30: 9 mg/h via INTRAVENOUS
  Administered 2018-08-30: 5 mg/h via INTRAVENOUS
  Administered 2018-08-30 (×2): 9 mg/h via INTRAVENOUS
  Administered 2018-08-30: 3 mg/h via INTRAVENOUS
  Administered 2018-08-31: 7 mg/h via INTRAVENOUS
  Administered 2018-08-31: 4 mg/h via INTRAVENOUS
  Administered 2018-08-31: 10 mg/h via INTRAVENOUS
  Administered 2018-09-01: 1 mg/h via INTRAVENOUS
  Filled 2018-08-30 (×9): qty 50

## 2018-08-30 MED ORDER — INSULIN GLARGINE 100 UNIT/ML ~~LOC~~ SOLN
20.0000 [IU] | Freq: Two times a day (BID) | SUBCUTANEOUS | Status: DC
  Administered 2018-08-30 – 2018-08-31 (×2): 20 [IU] via SUBCUTANEOUS
  Filled 2018-08-30 (×3): qty 0.2

## 2018-08-30 MED ORDER — VANCOMYCIN HCL IN DEXTROSE 750-5 MG/150ML-% IV SOLN
750.0000 mg | Freq: Two times a day (BID) | INTRAVENOUS | Status: DC
  Administered 2018-08-30 – 2018-09-01 (×5): 750 mg via INTRAVENOUS
  Filled 2018-08-30 (×5): qty 150

## 2018-08-30 MED ORDER — INSULIN ASPART 100 UNIT/ML ~~LOC~~ SOLN
8.0000 [IU] | SUBCUTANEOUS | Status: DC
  Administered 2018-08-30 – 2018-09-09 (×49): 8 [IU] via SUBCUTANEOUS

## 2018-08-30 NOTE — Progress Notes (Signed)
Pt sweating, tachypneic RR at 40s. Bilat. Breath sounds clear but diminished. Pain med given, pt suctioned, (small amt beige secretions obtained). Pt repositioned. RT to examine as well. By 1530, RR back to 20s, pt not sweating, appears comfortable. Will cont to monitor closely.

## 2018-08-30 NOTE — Progress Notes (Signed)
Patients HR dropped in the 40's with suctioning. HR returned to normal after 10 seconds. RN made aware.

## 2018-08-30 NOTE — Progress Notes (Signed)
Pharmacy Antibiotic Note  Andrea [name] is a 57 y.o. female admitted on 08/19/2018 with an acute ischemic stroke. She currently trach-dependent and has a PEG. Patient is on vancomycin and ceftazidime due to concern for HCAP. WBC remains elevated at 19 and she spiked a fever yesterday. An 11 hr vanc trough was 26 today. Confirmed with RN that this level was collected prior to hanging vancomycin dose.    Plan: -Continue ceftazidime to 2 gm IV Q 8 hours  -Decrease vancomycin to 750 mg IV Q 12 hours  -Monitor renal fx, cultures, VT at steady state  Height: 5\' 4"  (162.6 cm) Weight: 285 lb 4.4 oz (129.4 kg) IBW/kg (Calculated) : 54.7  Temp (24hrs), Avg:99.7 F (37.6 C), Min:98.8 F (37.1 C), Max:101.3 F (38.5 C)  Recent Labs  Lab 08/26/18 0406 08/27/18 0354 08/28/18 0357 08/29/18 0614 08/29/18 1031 08/30/18 0052 08/30/18 0951  WBC 16.1* 20.6* 19.4* 19.0*  --  20.8*  --   CREATININE 1.22* 1.18* 1.06* 1.12*  --  1.19*  --   VANCOTROUGH  --   --   --   --  22*  --  26*      Antimicrobials this admission: 10/22 vancomycin > 10/22 ceftazidime >  Dose adjustments this admission: 10/26: 11 hr VT 26 on Vanc 1 gm IV Q 12 hours>> decrease to 750 mg IV Q 12 h  Microbiology results: 10/10 mrsa pcr: neg 10/21 TA: normal flora  10/23 BCx>> ngtd   Albertina Parr, PharmD., BCPS Clinical Pharmacist Clinical phone for 08/30/18 until 3:30pm: [phone] If after 3:30pm, please refer to [facility] for unit-specific pharmacist

## 2018-08-30 NOTE — Progress Notes (Signed)
FPTS Interim Progress Note  Patient spiked fever last night.  Appreciate the great care critical care and neurology are providing for Andrea [name].  FPTS will continue following.  Guayanilla Bing, DO 08/30/2018, 6:27 AM PGY-3, [facility] pager: [phone]

## 2018-08-30 NOTE — Progress Notes (Signed)
NAME:  Andrea [name], MRN:  [id number], DOB:  Jul 29, 1961, LOS: 60 ADMISSION DATE:  08/12/2018, CONSULTATION DATE:  08/28/2018 REFERRING MD:  Dr. Rory Percy, CHIEF COMPLAINT:  CP/ Acute CVA  Brief History   59 yoF w/poorly controlled HTN and IDDM presenting with CP and left arm pain w/BP 220/117. Initial CTH neg.  Cardiac workup negative for acute event thus far, cardiology following.  Code stroke initiated for R hemiplegia, dysarthria and right facial droop.  Out of window for TPA.  Requiring cleviprex for BP control.  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  Evolving pontine stroke on MRI. Intubated for neuro IR s/p stenting and angioplasty with 80-90% patency.  Extubated post procedure but due to insufficency placed on BiPAP and brought to ICU.    Past Medical History  HTN, IDDM, hypothyroidism s/p ablation  Significant Hospital Events   10/9 present to [facility] ED -transferred to [facility] 10/9 basilar artery stenting 10/9 intubated urgently 6h post procedure for inability to protect airway. 10/10 CTA for declining exam - evolving pontine infarct, stent deemed to be patent 10/14 Brillinta stopped and ASA/ heparin started to in anticipation of tracheostomy on Monday. 10/14 through 10/19: Neuro exam has been stable sodium increasing in spite of free water via tube, providing supportive care awaiting trach 10/20: Sodium down to 151 from 156 clinically looks the same 10/21: Sodium down to 148, sputum culture sent for purulent tracheal secretions, awaiting trach and PEG.  Purulent bronchial secretions from right upper lobe sent for BAL 10/22: Tracheostomy and PEG completed on the 21st tolerated well.  Spiking low-grade fever white cells continued to climb starting empiric vancomycin and ceftaz.  Sodium improved down to 146.  Blood pressure improving but still not at goal added hydrochlorothiazide.  Resumed Brilinta.  10/23: Sodium normal.  Backing down on free water.  Glycemic control  challenging, increased basal NovoLog in addition to Lantus and sliding scale.  Still spiking fever, white blood cell count climbing, sending blood cultures.  Foley catheter being placed for pressure ulcer caused by pure wick device   Consults: date of consult/date signed off & final recs:  Cards 10/10 Neurology 10/10 PCCM 10/10  Procedures (surgical and bedside):  10/10 Neuro IR >> s/p stent and angioplasty of distal basilar artery  10/10 ETT for procedure 10/10 ETT (reintubated) >> removed 10/21: Size 6 tracheostomy placed by Dr. Nelda Marseille 10/26 changed to #4 trach cuffless   Significant Diagnostic Tests:  08/26/2018  CTH >>  No acute intracranial abnormalities. Mild white matter changes likely due to small vessel ischemia  08/1018 CTH >> 1. No acute abnormality and no change from earlier today 2. ASPECTS is 10  08/15/2018  CTA head and neck >>  1. 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 No definite acute thrombus identified. 2. Severe stenosis left posterior cerebral artery and moderate stenosis right posterior cerebral artery. 3. Mild stenosis left MCA and moderate stenosis left MCA bifurcation. 4. No significant carotid or vertebral artery stenosis in the neck.  08/30/2018 MRI - evolving pontine infarct.  08/15/18  Echocardiogram - normal LVSF with severe LVH.   Micro Data:  08/12/2018 MRSA PCR >>negative 10/21: Sputum culture: Abundant white blood cells, moderate GPC moderate G PR rare GNR>>> nl resp flora  10/23: Blood cultures x 2 >>>  Antimicrobials:  10/10 cefazolin preop 10/22: Vancomycin>>> 10/22: Ceftaz >>  Subjective:  Spiked fever over night / blood sugars  up but no real clinical changes   Objective   Blood pressure (!) 141/70, pulse 82, temperature 99 F (37.2 C), temperature source Axillary, resp. rate (!) 36, height _0  (1.626 m), weight 129.4 kg, last menstrual period 07/26/2018, SpO2 99 %.      FiO2 (%):  [28 %] 28 %   Intake/Output Summary (Last 24 hours) at 08/30/2018 1355 Last data filed at 08/30/2018 0745 Gross per 24 hour  Intake 1948.93 ml  Output 1805 ml  Net 143.93 ml   Filed Weights   08/28/18 0500 08/29/18 0500 08/30/18 0500  Weight: 128 kg 128 kg 129.4 kg    Examination:   Obese chronically ill bf nad / malodorous trach drainage  No jvd Oropharynx clear,  mucosa nl Neck supple/ trach good shape Lungs with a few scattered exp > insp rhonchi bilaterally RRR no s3 or or sign murmur Abd obese with limited insp  excursion  Extr warm with no edema or clubbing noted Neuro  Sticks tongue out to command    Resolved Hospital Problem list    Assessment & Plan:   Acute ischemic pontine stroke with stent placement in basilar to left proximal PCA Resulting in inadequate airway protection and   inability to follow commands in the extremities.     Plan Continue ASA and Brilinta  SNF placement May move out of the ICU and fm medicine to assume medical care  As of 10/27   Respiratory failure/ hypoxemia  due to inability to protect airway from cva .  Expected with location of stroke.   >>  trach collar x24/7  >> no increased wob / desats  Plan  we can see M-W-F for trach care   Increased tracheal secretions, nasal discharge, and leukocytosis -Worrisome for sinusitis and for tracheobronchitis: BAL sent from right upper lobe on 10/21  - fevers are low grade, cultures are pending >  Plan Ceftazidime and vancomycin follow-up cultures w/a   Hypertensive Crisis Blood pressure much improved following tracheostomy Plan  Continue current dosing of Coreg, hydralazine, Norvasc and hydrochlorothiazide  Pressure ulcer Stage II on vulva area felt secondary to pure wick device Plan Place Foley catheter to prevent moisture injury and monitor clinically  Type 2 diabetes Plan Increased  Lantus at  20 units twice daily Increased meal coverage  with sliding scale Fm  medicine to assume management of cm      Hypernatremia, with increased creatinine.  Both normalized with free water replacement  Plan Continue free water via tube   Anemia without evidence of bleeding Plan Trend CBC  Disposition / Summary of Today's Plan 08/30/18   PCCM care to see MWF    Best Practice  Diet: TFs Pain/Anxiety/Delirium protocol (if indicated): RASS goal 0 effective 10/22 VAP protocol (if indicated): Ordered DVT prophylaxis: SCDs  GI prophylaxis: Famotidine Hyperglycemia protocol: SSI, lantus Mobility: Out of bed if tol  Code Status: Full     Labs ( personally  Reviewed)  CBC: Recent Labs  Lab 08/26/18 0406 08/27/18 0354 08/28/18 0357 08/29/18 0614 08/30/18 0052  WBC 16.1* 20.6* 19.4* 19.0* 20.8*  HGB 8.2* 7.9* 7.7* 7.0* 8.8*  HCT 28.7* 27.9* 25.9* 23.8* 29.7*  MCV 81.8 82.1 78.2* 78.8* 80.1  PLT 506* 460* 475* 489* 553*    Basic Metabolic Panel: Recent Labs  Lab 08/26/18 0406 08/27/18 0354 08/28/18 0357 08/29/18 0614 08/30/18 0052  NA 146* 141 137 139 138  K 3.7 3.7 3.3* 4.2 4.5  CL 114* 109 104 105  102  CO2 _0 GLUCOSE 217* 253* 219* 229* 272*  BUN 39* 39* 33* 37* 37*  CREATININE 1.22* 1.18* 1.06* 1.12* 1.19*  CALCIUM 11.6* 11.2* 10.9* 11.1* 11.6*  MG  --   --   --  2.2  --   PHOS  --   --   --  2.9  --    GFR: Estimated Creatinine Clearance: 69.7 mL/min (A) (by C-G formula based on SCr of 1.19 mg/dL (H)). Recent Labs  Lab 08/27/18 0354 08/28/18 0357 08/29/18 0614 08/30/18 0052  WBC 20.6* 19.4* 19.0* 20.8*    Liver Function Tests: No results for input(s): AST, ALT, ALKPHOS, BILITOT, PROT, ALBUMIN in the last 168 hours. No results for input(s): LIPASE, AMYLASE in the last 168 hours. No results for input(s): AMMONIA in the last 168 hours.  ABG    Component Value Date/Time   PHART 7.334 (L) 08/06/2018 2259   PCO2ART 41.9 09/04/2018 2259   PO2ART 275.0 (H) 09/04/2018 2259   HCO3 22.3 08/22/2018 2259   TCO2  24 08/30/2018 2259   ACIDBASEDEF 3.0 (H) 08/26/2018 2259   O2SAT 100.0 08/09/2018 2259     Coagulation Profile: Recent Labs  Lab 08/09/2018 0646  INR 1.20    Cardiac Enzymes: No results for input(s): CKTOTAL, CKMB, CKMBINDEX, TROPONINI in the last 168 hours.  HbA1C: Hgb A1c MFr Bld  Date/Time Value Ref Range Status  08/15/2018 05:12 AM 11.4 (H) 4.8 - 5.6 % Final    Comment:    (NOTE) Pre diabetes:          5.7%-6.4% Diabetes:              >6.4% Glycemic control for   <7.0% adults with diabetes   01/12/2017 02:43 AM 12.8 (H) 4.8 - 5.6 % Final    Comment:    (NOTE)         Pre-diabetes: 5.7 - 6.4         Diabetes: >6.4         Glycemic control for adults with diabetes: <7.0     CBG: Recent Labs  Lab 08/29/18 1953 08/29/18 2352 08/30/18 0322 08/30/18 0729 08/30/18 1231  GLUCAP 202* 225* 272* 249* 321*        The patient is critically ill with multiple organ systems failure and requires high complexity decision making for assessment and support, frequent evaluation and titration of therapies, application of advanced monitoring technologies and extensive interpretation of multiple databases. Critical Care Time devoted to patient care services described in this note is 30 minutes.   Christinia Gully, MD Pulmonary and [place] [phone] After 5:30 PM or weekends, use [facility] [phone]

## 2018-08-30 NOTE — Progress Notes (Signed)
Vanc trough high at 26. Pharmacist [facility] notified.

## 2018-08-30 NOTE — Progress Notes (Signed)
STROKE TEAM PROGRESS NOTE   SUBJECTIVE (INTERVAL HISTORY) Pt RN is at the bedside. She is on trach and peg. On Vanco and Ceftazidime for concern for HCAP.   Patient is alert and nods to simple questions, neuro exam appear stable. She is febrile, her urine smells,elevated WBCs. Discussed case with CCM , they will follow patient through the weekend, much appreciate. Vanco being followed by pharmacy.    OBJECTIVE Vitals:   08/30/18 1100 08/30/18 1115 08/30/18 1130 08/30/18 1138  BP: (!) 161/82 (!) 146/70 (!) 141/70   Pulse:    82  Resp: 16 (!) 30 (!) 36   Temp:      TempSrc:      SpO2:      Weight:      Height:        CBC:  Recent Labs  Lab 08/29/18 0614 08/30/18 0052  WBC 19.0* 20.8*  HGB 7.0* 8.8*  HCT 23.8* 29.7*  MCV 78.8* 80.1  PLT 489* 553*    Basic Metabolic Panel:  Recent Labs  Lab 08/29/18 0614 08/30/18 0052  NA 139 138  K 4.2 4.5  CL 105 102  CO2 25 25  GLUCOSE 229* 272*  BUN 37* 37*  CREATININE 1.12* 1.19*  CALCIUM 11.1* 11.6*  MG 2.2  --   PHOS 2.9  --     Lipid Panel:     Component Value Date/Time   CHOL 256 (H) 08/15/2018 0512   TRIG 248 (H) 08/15/2018 0512   HDL 35 (L) 08/15/2018 0512   CHOLHDL 7.3 08/15/2018 0512   VLDL 50 (H) 08/15/2018 0512   LDLCALC 171 (H) 08/15/2018 0512   HgbA1c:  Lab Results  Component Value Date   HGBA1C 11.4 (H) 08/15/2018   Urine Drug Screen:     Component Value Date/Time   LABOPIA NONE DETECTED 08/11/2018 1836   COCAINSCRNUR NONE DETECTED 08/29/2018 1836   LABBENZ NONE DETECTED 08/29/2018 1836   AMPHETMU NONE DETECTED 08/09/2018 1836   THCU NONE DETECTED 08/24/2018 1836   LABBARB NONE DETECTED 08/30/2018 1836    Alcohol Level No results found for: ETH  IMAGING  Ct Angio Head W Or Wo Contrast Ct Angio Neck W Or Wo Contrast 08/15/2018 IMPRESSION:  1. Interval basilar to left proximal PCA stenting. The density of the stent walls precludes detection of in stent stenosis; there is a degree of wasting  at the mid basilar segment. There is flow in the left PCA beyond the stent such that there is presumed stent patency.  2. Known lower pontine infarct. There are new small cerebellar infarcts since brain MRI yesterday.  3. Severe left P2 segment stenosis, also seen previously.  4. Moderate atheromatous narrowing in the proximal left MCA.    Ct Angio Head W Or Wo Contrast Ct Angio Neck W Or Wo Contrast 08/29/2018 IMPRESSION:  1. 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 No definite acute thrombus identified.  2. Severe stenosis left posterior cerebral artery and moderate stenosis right posterior cerebral artery.  3. Mild stenosis left MCA and moderate stenosis left MCA bifurcation.  4. No significant carotid or vertebral artery stenosis in the neck.    Ct Head Wo Contrast 08/18/2018 IMPRESSION:  No acute intracranial abnormalities. Mild white matter changes likely due to small vessel ischemia.    Mr Brain Wo Contrast 08/27/2018 IMPRESSION:  1. Acute nonhemorrhagic infarct involving the left paramedian brainstem. The infarct crosses midline.  2. Other periventricular and  subcortical white matter disease is moderately advanced for age. This likely reflects the sequela of chronic microvascular ischemia.  3. Tapering of the dens with prominent soft tissue pannus. 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    Ct Head Code Stroke Wo Contrast 09/02/2018  IMPRESSION:  1. No acute abnormality and no change from earlier today  2. ASPECTS is 10 3.    Cerebral Angiogram 08/20/2018 S/P 4 vessel cerebral arteriogram RT CFA approach. 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 S/P stent assisted angioplasty of distal basilar artery with patency of 80 to 90 %. 2.Approx 70 % stenosis of LT ICA supraclinoid seg   MRI  08/15/18 1. Progressive acute pontine infarct. New patchy bilateral cerebellar infarction. New tiny left thalamic infarcts. 2. Preserved flow void in the stented basilar. 3. left facial/submandibular edema, please correlate with neck exam. Edited result: IMPRESSION: 1. Progressive acute pontine infarct. New patchy bilateral cerebellar infarction. New tiny left thalamic infarcts. 2. Preserved flow void in the stented basilar.  TTE - Left ventricle: The cavity size was normal. There was severe   concentric hypertrophy. Systolic function was normal. The   estimated ejection fraction was in the range of 60% to 65%. Wall   motion was normal; there were no regional wall motion   abnormalities. Doppler parameters are consistent with abnormal   left ventricular relaxation (grade 1 diastolic dysfunction). The   E/e&' ratio is <8, suggesting normal LV filling pressure. - Aortic valve: Trileaflet; mildly calcified leaflets.   Transvalvular velocity was minimally increased. There was no   regurgitation. Mean gradient (S): 12 mm Hg. - Mitral valve: Mildly thickened leaflets . There was trivial   regurgitation. - Left atrium: The atrium was normal in size. - Inferior vena cava: The vessel was dilated. The respirophasic   diameter changes were blunted (< 50%), consistent with elevated   central venous pressure. Impressions: - LVEF 60-65%, severe LVH, normal wall motion, grade 1 DD, normal   LV filling pressure, minimally increased aortic velocity without   signficant stenosis, trivial MR, normal LA size, dilated IVC.  Dg Chest Port 1 View  Result Date: 08/23/2018 CLINICAL DATA:  Encounter for fever. EXAM: PORTABLE CHEST 1 VIEW COMPARISON:  08/19/2018 FINDINGS: Endotracheal tube is 3.6 cm above the carina. Airspace densities in the right hilum and right lower lung have markedly decreased and possibly resolved. There may be a small amount of residual disease in the medial right lower lung.  Vascular crowding in the hilar regions. Low lung volumes. Heart size is grossly stable and within normal limits. Nasogastric tube extends into the abdomen. Negative for pneumothorax. IMPRESSION: Airspace disease in the right lung has markedly decreased. Low lung volumes. Support apparatuses as described. Electronically Signed   By: Markus Daft M.D.   On: 08/23/2018 11:07    DG Chest 1 View 08/30/2018 IMPRESSION: Cardiomegaly and mild pulmonary venous congestion/edema. No other change.   PHYSICAL EXAM  Temp:  [98.8 F (37.1 C)-101.3 F (38.5 C)] 99.1 F (37.3 C) (10/26 0800) Pulse Rate:  [34-106] 82 (10/26 1138) Resp:  [12-41] 36 (10/26 1130) BP: (134-177)/(65-85) 141/70 (10/26 1130) SpO2:  [96 %-100 %] 99 % (10/26 1000) FiO2 (%):  [28 %] 28 % (10/26 1139) Weight:  [129.4 kg] 129.4 kg (10/26 0500)  General -  Obese middle-aged African-American lady, not in distress.    Ophthalmologic - fundi not visualized due  to noncooperation.  Cardiovascular - Regular rate and rhythm.  Neuro - post trach, on trach collar, off sedation. Eyes open, awake, alert. No ptosis or EOMI, denies diplopia. PERRL. blinking to visual threat bilaterally. Right gaze nystagmus is improved but still persistent. Bilateral facial weakness, R>L, corneal, gag and cough present. Able to open mouth slightly but not able to protrude tongue. Quadriplegia, with left LE 2-/5 proximal and 0/5 distal. DTR diminished. Sensation not cooperative, coordination and gait not tested.   ASSESSMENT/PLAN Ms. KELEIGH KAZEE is a 57 y.o. female with history of difficult to control hypertension, insulin-dependent diabetes, obesity, hypothyroidism status post ablation presenting with chest discomfort, right-sided weakness, slurred speech and right facial droop. She did not receive IV t-PA due to late presentation. S/P stent assisted angioplasty of distal basilar artery.  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 Worsening symptoms with extension of pontine/medullary infarcts with new small bilateral cerebellar infarcts without evidence of stent re-stenosis or occlusion  Resultant b// UE and LE weakness, eyes minimally disconjugate but improving, nystagmus  CT head - No acute intracranial abnormalities.  CTA H&N 08/12/2018 - Extensive atherosclerotic disease in the basilar with focal critical stenosis in the mid to distal basilar. No definite acute thrombus identified. Severe stenosis left posterior cerebral artery  MRI head - Acute nonhemorrhagic infarct involving the left paramedian brainstem.   CTA H&N 08/15/2018 - stent too dense to see BA lumen, but distal flow preserved. New small cerebellar infarcts since brain MRI yesterday.  Severe left P2 segment stenosis, also seen previously.   Repeat MRI - extension of b/l pontine and upper medullary infarcts with new b/l cerebellar infarcts.  2D Echo - EF 60-65%  LDL - 174  HgbA1c - 11.1  VTE prophylaxis - heparin subq  aspirin 81 mg daily prior to admission, now resumed on Brilinta 90 mg bid and ASA 81 mg daily.   Patient counseled to be compliant with her antithrombotic medications  Ongoing aggressive stroke risk factor management  Therapy recommendations:  LTACH  Disposition:  Pending  Intracranial stenosis  BA mid to distal severe stenosis s/p BA stenting  Left PCA severe stenosis, R PCA moderate stenosis  Left MCA moderate stenosis  Uncontrolled stroke risk factors with HLD, HTN, DM  Non compliance with meds at home  Respiratory failure  S/p trach 08/07/2018  On trach collar now, tolerating well  Still has secretions, improved  CCM following with trach intermittently  Hypertension  Stable  BP goal 130-150   On po norvasc, losartan  Metoprolol changed to Coreg 25 bid, also on HCTZ 25mg  now  Also on low dose hydralazine 25mg  tid  Hyperlipidemia  Lipid lowering medication PTA:  Lipitor  20 mg daily  LDL 174, goal < 70  Increase to 80 mg daily  Continue statin at discharge  Diabetes  HgbA1c 11.4, goal < 7.0  Uncontrolled  Hyperglycemia  Lantus increase from 15U bid to 18U bid   Continue basal NovoLog increase from 4U Q4h to 6U Q4h  SSI  CBG monitoring - glucose 272 -> Diabetic nurse consult  Infection no doubt contributing to elevated glucose.  Low grade fever with leukocytosis  Tmax 100.7 -> afebrile->100.7->101-> afebrile   Leukocytosis - 12.7->12.9->14.4->14.3->16.1->20.6->19.4->19.0 -> 20.8  UA neg for UTI  CXR unremarkable, improving from prior  Sputum culture normal flora  On vanco and fortaz for empiric treatment   CCM signed off  Seen by Dr Rory Percy just after midnight for  temp 101.5 -> 99.1   U/A abnormal - culture pending ; Blood cultures pending ; CXR  Not C/W pneumonia    Hypernatremia and elevated Cre/BUN  Na 156->156->151->148->146->141->137->139  Cre 1.41->1.40->1.22->1.16->1.22->1.18->1.06->1.12-> 1.19  Put on NS @ 50  On TF @ 40  Add free water 200mg  Q8h  Continue BMP monitoring  Dysphagia s/p PEG  Continue tube feeding 45 cc/h  PEG done 08/19/2018  Anemia   Hb 11.1->9.7->9.4->9.3->9.4->8.8->8.2->7.9->7.7->7.0 -> 8.8  Likely due to iron deficiency and acute uterine bleeding  Iron panel showed iron deficiency  Put on iron pills  PRBC transfusion 2 U today  CBC monitoring  Abnormal uterine bleeding  Has been following with GYN  Was on megace in the past  Will resume megace 80mg  bid  Continue ASA and brilinta for now  Vaginal bleeding much improved as per RN  Close CBC monitoring  Other Stroke Risk Factors  Obesity, Body mass index is 48.97 kg/m., recommend weight loss, diet and exercise as appropriate   Other Active Problems  Hypokalemia - on supplement for K 3.3->4.2 -> 4.5  Plts - 489 -> [facility] day # 16  This patient is critically ill due to worsening brainstem infarct, BA  stenosis s/p stenting, hyperglycemia, currently febrile, elevated WBCs,  hypertensive,  anemia and hypernatremia and at significant risk of neurological worsening, death form recurrent stroke, hemorrhagic conversion, CHF, seizure, aspiration, hemorrhagic shock. This patient's care requires constant monitoring of vital signs, hemodynamics, respiratory and cardiac monitoring, review of multiple databases, neurological assessment, discussion with family, other specialists and medical decision making of high complexity. I spent 40 minutes of neurocritical care time in the care of this patient.      To contact Stroke Continuity provider, please refer to [email or web address] After hours, contact General Neurology

## 2018-08-30 NOTE — Progress Notes (Addendum)
On call cross cover note  Pt spiked fever last night - 101.67F Blood c/s drawn, CXR ordered, UA ordered.  Also hypertensive above goal with PRN labetalol and PO meds.  RECS: Cleviprex gtt prn C/W Antibiotics.  -- Amie Portland, MD [facility] Pager: [phone] If 7pm to 7am, please call on call as listed on [employer].

## 2018-08-30 NOTE — Progress Notes (Signed)
Discussed temp, trach and peg odors, hyperglycemia and vaginal bleeding with Dr. Melvyn Novas. CCM or Family Medicine to address.

## 2018-08-31 DIAGNOSIS — D500 Iron deficiency anemia secondary to blood loss (chronic): Secondary | ICD-10-CM

## 2018-08-31 DIAGNOSIS — J9600 Acute respiratory failure, unspecified whether with hypoxia or hypercapnia: Secondary | ICD-10-CM

## 2018-08-31 DIAGNOSIS — N939 Abnormal uterine and vaginal bleeding, unspecified: Secondary | ICD-10-CM

## 2018-08-31 DIAGNOSIS — R6520 Severe sepsis without septic shock: Secondary | ICD-10-CM

## 2018-08-31 DIAGNOSIS — A419 Sepsis, unspecified organism: Secondary | ICD-10-CM

## 2018-08-31 LAB — GLUCOSE, CAPILLARY
GLUCOSE-CAPILLARY: 225 mg/dL — AB (ref 70–99)
GLUCOSE-CAPILLARY: 197 mg/dL — AB (ref 70–99)
GLUCOSE-CAPILLARY: 250 mg/dL — AB (ref 70–99)
GLUCOSE-CAPILLARY: 171 mg/dL — AB (ref 70–99)
Glucose-Capillary: 146 mg/dL — ABNORMAL HIGH (ref 70–99)
Glucose-Capillary: 150 mg/dL — ABNORMAL HIGH (ref 70–99)

## 2018-08-31 LAB — POCT I-STAT 3, ART BLOOD GAS (G3+)
ACID-BASE EXCESS: 2 mmol/L (ref 0.0–2.0)
Bicarbonate: 25.4 mmol/L (ref 20.0–28.0)
O2 Saturation: 84 %
PCO2 ART: 34.5 mmHg (ref 32.0–48.0)
PH ART: 7.476 — AB (ref 7.350–7.450)
PO2 ART: 46 mmHg — AB (ref 83.0–108.0)
Patient temperature: 99.5
TCO2: 26 mmol/L (ref 22–32)

## 2018-08-31 LAB — CBC
HCT: 28.5 % — ABNORMAL LOW (ref 36.0–46.0)
Hemoglobin: 8.7 g/dL — ABNORMAL LOW (ref 12.0–15.0)
MCH: 24.6 pg — AB (ref 26.0–34.0)
MCHC: 30.5 g/dL (ref 30.0–36.0)
MCV: 80.7 fL (ref 80.0–100.0)
NRBC: 0.2 % (ref 0.0–0.2)
Platelets: 535 10*3/uL — ABNORMAL HIGH (ref 150–400)
RBC: 3.53 MIL/uL — AB (ref 3.87–5.11)
RDW: 15.9 % — ABNORMAL HIGH (ref 11.5–15.5)
WBC: 19.7 10*3/uL — ABNORMAL HIGH (ref 4.0–10.5)

## 2018-08-31 LAB — BASIC METABOLIC PANEL
ANION GAP: 11 (ref 5–15)
BUN: 41 mg/dL — ABNORMAL HIGH (ref 6–20)
CHLORIDE: 105 mmol/L (ref 98–111)
CO2: 23 mmol/L (ref 22–32)
Calcium: 11.4 mg/dL — ABNORMAL HIGH (ref 8.9–10.3)
Creatinine, Ser: 1.27 mg/dL — ABNORMAL HIGH (ref 0.44–1.00)
GFR calc Af Amer: 53 mL/min — ABNORMAL LOW (ref >60)
GFR, EST NON AFRICAN AMERICAN: 46 mL/min — AB (ref >60)
Glucose, Bld: 259 mg/dL — ABNORMAL HIGH (ref 70–99)
Potassium: 3.8 mmol/L (ref 3.5–5.1)
SODIUM: 139 mmol/L (ref 135–145)

## 2018-08-31 MED ORDER — WHITE PETROLATUM EX OINT
TOPICAL_OINTMENT | CUTANEOUS | Status: AC
  Administered 2018-08-31: 0.2
  Filled 2018-08-31: qty 28.35

## 2018-08-31 MED ORDER — INSULIN GLARGINE 100 UNIT/ML ~~LOC~~ SOLN
22.0000 [IU] | Freq: Two times a day (BID) | SUBCUTANEOUS | Status: DC
  Administered 2018-08-31 – 2018-09-09 (×18): 22 [IU] via SUBCUTANEOUS
  Filled 2018-08-31 (×20): qty 0.22

## 2018-08-31 MED ORDER — HYDROCHLOROTHIAZIDE 10 MG/ML ORAL SUSPENSION
50.0000 mg | Freq: Every day | ORAL | Status: DC
  Filled 2018-08-31: qty 5

## 2018-08-31 NOTE — Progress Notes (Addendum)
STROKE TEAM PROGRESS NOTE   SUBJECTIVE (INTERVAL HISTORY) Pt RN is at the bedside. She is on trach and peg. On Vanco and Ceftazidime for concern for HCAP.   Patient is alert and nods to simple questions, neuro exam appear stable. She is febrile, odor of urine smells,elevated WBCs. Discussed case with CCM again today , they will follow patient through the weekend, much appreciate. Vanco being followed by pharmacy. She has been tachypnic and needing frequent suctioning. Also anemic but stable today, vaginal bleeding. Urine cloudy with +Leuk yesterday. CXR unremarkable. Patient is in distress.   OBJECTIVE Vitals:   08/31/18 1400 08/31/18 1415 08/31/18 1430 08/31/18 1445  BP: (!) 153/79 (!) 147/78 (!) 144/76 (!) 151/83  Pulse: 95 93 87 89  Resp: (!) 27 (!) 43 (!) 43 (!) 35  Temp:      TempSrc:      SpO2: 100% 100% 100% 100%  Weight:      Height:        CBC:  Recent Labs  Lab 08/30/18 0052 08/31/18 0114  WBC 20.8* 19.7*  HGB 8.8* 8.7*  HCT 29.7* 28.5*  MCV 80.1 80.7  PLT 553* 535*    Basic Metabolic Panel:  Recent Labs  Lab 08/29/18 0614 08/30/18 0052 08/31/18 0114  NA 139 138 139  K 4.2 4.5 3.8  CL 105 102 105  CO2 25 25 23   GLUCOSE 229* 272* 259*  BUN 37* 37* 41*  CREATININE 1.12* 1.19* 1.27*  CALCIUM 11.1* 11.6* 11.4*  MG 2.2  --   --   PHOS 2.9  --   --     Lipid Panel:     Component Value Date/Time   CHOL 256 (H) 08/15/2018 0512   TRIG 248 (H) 08/15/2018 0512   HDL 35 (L) 08/15/2018 0512   CHOLHDL 7.3 08/15/2018 0512   VLDL 50 (H) 08/15/2018 0512   LDLCALC 171 (H) 08/15/2018 0512   HgbA1c:  Lab Results  Component Value Date   HGBA1C 11.4 (H) 08/15/2018   Urine Drug Screen:     Component Value Date/Time   LABOPIA NONE DETECTED 08/17/2018 1836   COCAINSCRNUR NONE DETECTED 08/19/2018 1836   LABBENZ NONE DETECTED 08/20/2018 1836   AMPHETMU NONE DETECTED 08/24/2018 1836   THCU NONE DETECTED 08/25/2018 1836   LABBARB NONE DETECTED 09/02/2018 1836     Alcohol Level No results found for: ETH  IMAGING  Ct Angio Head W Or Wo Contrast Ct Angio Neck W Or Wo Contrast 08/15/2018 IMPRESSION:  1. Interval basilar to left proximal PCA stenting. The density of the stent walls precludes detection of in stent stenosis; there is a degree of wasting at the mid basilar segment. There is flow in the left PCA beyond the stent such that there is presumed stent patency.  2. Known lower pontine infarct. There are new small cerebellar infarcts since brain MRI yesterday.  3. Severe left P2 segment stenosis, also seen previously.  4. Moderate atheromatous narrowing in the proximal left MCA.    Ct Angio Head W Or Wo Contrast Ct Angio Neck W Or Wo Contrast 08/13/2018 IMPRESSION:  1. 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 No definite acute thrombus identified.  2. Severe stenosis left posterior cerebral artery and moderate stenosis right posterior cerebral artery.  3. Mild stenosis left MCA and moderate stenosis left MCA bifurcation.  4. No significant carotid or vertebral artery stenosis in the neck.    Ct Head  Wo Contrast 08/30/2018 IMPRESSION:  No acute intracranial abnormalities. Mild white matter changes likely due to small vessel ischemia.    Mr Brain Wo Contrast 08/31/2018 IMPRESSION:  1. Acute nonhemorrhagic infarct involving the left paramedian brainstem. The infarct crosses midline.  2. Other periventricular and subcortical white matter disease is moderately advanced for age. This likely reflects the sequela of chronic microvascular ischemia.  3. Tapering of the dens with prominent soft tissue pannus. 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    Ct Head Code Stroke Wo Contrast 08/30/2018  IMPRESSION:  1. No acute abnormality and no change from earlier today  2. ASPECTS is 10 3.    Cerebral Angiogram 08/11/2018 S/P 4 vessel cerebral arteriogram RT CFA  approach. 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 S/P stent assisted angioplasty of distal basilar artery with patency of 80 to 90 %. 2.Approx 70 % stenosis of LT ICA supraclinoid seg   MRI 08/15/18 1. Progressive acute pontine infarct. New patchy bilateral cerebellar infarction. New tiny left thalamic infarcts. 2. Preserved flow void in the stented basilar. 3. left facial/submandibular edema, please correlate with neck exam. Edited result: IMPRESSION: 1. Progressive acute pontine infarct. New patchy bilateral cerebellar infarction. New tiny left thalamic infarcts. 2. Preserved flow void in the stented basilar.  TTE - Left ventricle: The cavity size was normal. There was severe   concentric hypertrophy. Systolic function was normal. The   estimated ejection fraction was in the range of 60% to 65%. Wall   motion was normal; there were no regional wall motion   abnormalities. Doppler parameters are consistent with abnormal   left ventricular relaxation (grade 1 diastolic dysfunction). The   E/e&' ratio is <8, suggesting normal LV filling pressure. - Aortic valve: Trileaflet; mildly calcified leaflets.   Transvalvular velocity was minimally increased. There was no   regurgitation. Mean gradient (S): 12 mm Hg. - Mitral valve: Mildly thickened leaflets . There was trivial   regurgitation. - Left atrium: The atrium was normal in size. - Inferior vena cava: The vessel was dilated. The respirophasic   diameter changes were blunted (< 50%), consistent with elevated   central venous pressure. Impressions: - LVEF 60-65%, severe LVH, normal wall motion, grade 1 DD, normal   LV filling pressure, minimally increased aortic velocity without   signficant stenosis, trivial MR, normal LA size, dilated IVC.  Dg Chest Port 1 View  Result Date: 08/23/2018 CLINICAL DATA:  Encounter for fever.  EXAM: PORTABLE CHEST 1 VIEW COMPARISON:  08/19/2018 FINDINGS: Endotracheal tube is 3.6 cm above the carina. Airspace densities in the right hilum and right lower lung have markedly decreased and possibly resolved. There may be a small amount of residual disease in the medial right lower lung. Vascular crowding in the hilar regions. Low lung volumes. Heart size is grossly stable and within normal limits. Nasogastric tube extends into the abdomen. Negative for pneumothorax. IMPRESSION: Airspace disease in the right lung has markedly decreased. Low lung volumes. Support apparatuses as described. Electronically Signed   By: Markus Daft M.D.   On: 08/23/2018 11:07    DG Chest 1 View 08/30/2018 IMPRESSION: Cardiomegaly and mild pulmonary venous congestion/edema. No other change.   PHYSICAL EXAM  Temp:  [99.2 F (37.3 C)-100.3 F (37.9 C)] 99.9 F (37.7 C) (10/27 1200) Pulse Rate:  [79-99] 89 (10/27 1445) Resp:  [16-44] 35 (10/27 1445) BP: (124-161)/(59-90) 151/83 (10/27  1445) SpO2:  [95 %-100 %] 100 % (10/27 1445) FiO2 (%):  [28 %-35 %] 35 % (10/27 1600) Weight:  [128.4 kg] 128.4 kg (10/27 0500)  General -  Obese middle-aged African-American lady, in distress. Mouthing "help", tachypnic needing frequent suctioning. She is alert, nods appropriately.   Ophthalmologic - fundi not visualized due to noncooperation.  Cardiovascular - not tachycardic currently in the 80s RRR  Neuro - post trach, on trach collar, off sedation. Eyes open, awake, alert. No ptosis or EOMI, denies diplopia.  PERRL. blinking to visual threat bilaterally. Right gaze nystagmus is improved, can follow and track examiner across the room. Bilateral facial weakness, R>L, corneal, gag and cough present. Able to open mouth slightly but not able to protrude tongue. Quadriplegia, with left LE flicker, proximal and 0/5 distal, grimaces to pain in the right UE. DTR diminished throughout. Coordination and gait not  tested.   ASSESSMENT/PLAN Ms. MARYANNE HUNEYCUTT is a 57 y.o. female with history of difficult to control hypertension, insulin-dependent diabetes, obesity, hypothyroidism status post ablation presenting with chest discomfort, right-sided weakness, slurred speech and right facial droop. She did not receive IV t-PA due to late presentation. S/P stent assisted angioplasty of distal basilar artery.  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 Worsening symptoms with extension of pontine/medullary infarcts with new small bilateral cerebellar infarcts without evidence of stent re-stenosis or occlusion  Resultant quadriplegia, on trach and peg  CT head - No acute intracranial abnormalities.  CTA H&N 08/18/2018 - Extensive atherosclerotic disease in the basilar with focal critical stenosis in the mid to distal basilar. No definite acute thrombus identified. Severe stenosis left posterior cerebral artery  MRI head - Acute nonhemorrhagic infarct involving the left paramedian brainstem.   CTA H&N 08/15/2018 - stent too dense to see BA lumen, but distal flow preserved. New small cerebellar infarcts since brain MRI yesterday.  Severe left P2 segment stenosis, also seen previously.   Repeat MRI - extension of b/l pontine and upper medullary infarcts with new b/l cerebellar infarcts.  2D Echo - EF 60-65%  LDL - 174  HgbA1c - 11.1  VTE prophylaxis - heparin subq  aspirin 81 mg daily prior to admission, now resumed on Brilinta 90 mg bid and ASA 81 mg daily.   Patient counseled to be compliant with her antithrombotic medications  Ongoing aggressive stroke risk factor management  Therapy recommendations:  LTACH  Disposition:  Pending  Intracranial stenosis  BA mid to distal severe stenosis s/p BA stenting  Left PCA severe stenosis, R PCA moderate stenosis  Left MCA moderate stenosis  Uncontrolled stroke risk factors with HLD, HTN, DM  Non  compliance with meds at home  Respiratory failure  S/p trach 08/30/2018  On trach collar now, tolerating well  Still has secretions, improved  CCM following with trach intermittently  Hypertension  Stable  BP goal 130-150   On po norvasc, losartan  Metoprolol changed to Coreg 25 bid, also on HCTZ 25mg  now  Also on low dose hydralazine 25mg  tid  Hyperlipidemia  Lipid lowering medication PTA:  Lipitor 20 mg daily  LDL 174, goal < 70  Increase to 80 mg daily  Continue statin at discharge  Diabetes  HgbA1c 11.4, goal < 7.0  Uncontrolled  Hyperglycemia  Lantus increase from 15U bid to 18U bid   Continue basal NovoLog increase from 4U Q4h to 6U Q4h  SSI  CBG monitoring - glucose 272 -> Diabetic nurse consult  Infection no doubt contributing to elevated glucose.  Low grade fever with leukocytosis  Tmax 100.7 -> afebrile->100.7->101-> afebrile   Leukocytosis - 12.7->12.9->14.4->14.3->16.1->20.6->19.4->19.0 -> 20.8->19.7  UA neg for UTI  CXR unremarkable, improving from prior  Sputum culture normal flora  On vanco and fortaz for empiric treatment   CCM signed off  Seen by Dr Rory Percy just after midnight for temp 101.5 -> 99.1 -> 99.9  U/A abnormal - culture pending ; Blood cultures pending ; CXR  Not C/W pneumonia  Respiratory cultures - mixed flora - final pending    Hypernatremia and elevated Cre/BUN  Na 156->156->151->148->146->141->137->139  Cre 1.41->1.40->1.22->1.16->1.22->1.18->1.06->1.12-> 1.19-> 1.27  Put on NS @ 50  On TF @ 69  Add free water 200mg  Q8h  Continue BMP monitoring  Dysphagia s/p PEG  Continue tube feeding 45 cc/h  PEG done 09/03/2018  Anemia   Hb 11.1->9.7->9.4->9.3->9.4->8.8->8.2->7.9->7.7->7.0 -> 8.8-> 8.7  Likely due to iron deficiency and acute uterine bleeding  Iron panel showed iron deficiency  Put on iron pills  PRBC transfusion 2 U Friday 08/29/2018  CBC monitoring  Abnormal uterine  bleeding  Has been following with GYN  Was on megace in the past  Will resume megace 80mg  bid  Continue ASA and brilinta for now  Vaginal bleeding much improved as per RN  Close CBC monitoring  Other Stroke Risk Factors  Obesity, Body mass index is 48.59 kg/m., recommend weight loss, diet and exercise as appropriate   Other Active Problems  Hypokalemia - on supplement for K 3.3->4.2 -> 4.5-> 3.8  Plts - 489 -> 553-> [facility] day # 17  This patient is critically ill due to worsening brainstem infarct, BA stenosis s/p stenting, hyperglycemia, currently febrile and tachypnic requiring frequent suctioning, elevated WBCs,  +leukocutes in urine, hypertensive,  anemia and hypernatremia and at significant risk of neurological worsening, death form recurrent stroke, hemorrhagic conversion, CHF, seizure, aspiration, hemorrhagic shock.   This patient is critically ill and at significant risk of neurological worsening, death and care requires constant monitoring of vital signs, hemodynamics,respiratory and cardiac monitoring,review of multiple databases, neurological assessment, discussion with family, other specialists and medical decision making of high complexity.I  I spent 30 minutes of neurocritical care time in the care of this patient.  Sarina Ill, MD      To contact Stroke Continuity provider, please refer to [email or web address] After hours, contact General Neurology

## 2018-08-31 NOTE — Progress Notes (Addendum)
CALL PAGER [phone] for any questions or notifications regarding this patient   FMTS Attending Daily Note: [name], MD  Pager [phone]  Office [phone] I have seen and examined this patient, reviewed their chart. I have discussed this patient with the resident. I agree with the resident's findings, assessment and care plan. I have edited the note below.  57 year old woman with history of morbid obesity, poorly controlled diabetes, hypertension and abnormal uterine bleeding with a normal endometrial biopsy in late 2017 who presented with chest pain and left arm pain.  During her hospital stay she had a cerebrovascular event and a large bilateral pontine infarction.  She is undergone intervention for this and continues to have decline in her neurologic status.  She is now status post tracheostomy and PEG tube placement.  Family medicine teaching service consulted for medical management.  I have edited the note below.  Recommend collecting urine culture, although utility may be limited given she has been on broad-spectrum antibiotics reported at this time.  She could have an ESBL UTI as she has been on broad spectrum with persistent fevers.  Differential also includes fungal infection, less likely venous thromboembolism although she has been off her heparin for abnormal uterine bleeding.   Family Medicine Teaching Service Daily Progress Note Intern Pager: [phone]  Patient name: Andrea [name] Medical record number: [id number] Date of birth: [birth year] Age: 57 y.o. Gender: female  Primary Care Provider: Danna Hefty, DO Consultants: Cardiology, Neurology, PCCM Code Status: Full code  Pt Overview and Major Events to Date:  08/10/2018 Admitted, Stenting of basilar artery, intubated 2/2 inability to protect airway 08/16/2018 CTA - evolving pontine infarct, patent stent 08/18/2018 Brilinta stopped --> ASA/Hep started 08/14/2018 Trach and PEG placed  08/26/2018 Vanc and Ceftaz started for  fever, leukocytosis, purulent trach secretions; Brilenta resumed 08/27/2018 Foley catheter placed due to skin breakdown with external catheter  08/29/2018 vaginal bleeding noted, started Megace, 2u pRBC transfused Hospital Day: 19   Assessment and Plan: AWANDA WILCOCK is a 57 y.o. female who presented with chest pain and hypertensive emergency admitted for ACS rule-out with acute stroke during admission. PMHx is significant for poorly controlled HTN and IDDM, hypothyroidism s/p ablation.  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 Limited LE mobility, L>R. ASA and Brilenta. Planning for [facility] placement.  - Management per neurology  Respiratory Failure/Hypoxemia: Stable  2/2 to inability to protect airway from CVA likely due to location of stroke Patient has trach collar, off of vent. O2 Sats 96-100% without desats. RR ON 16-45. Increased WOB this morning, improved with suctioning of secretions. Hypertension of 150's/70's. Some concern for PE due to Megace and Brillenta being held from the 15th to the 22nd. Well's Score 3. Will refer to primary at this time and continue to monitor.  - Trach care per CCM   Sepsis, marked by fever, tachypnea, elevated WBC.  On 10/21-10/22 patient had increased tracheal secretions and elevated temperature. WBC 14.1>20.6>19.7. Patient started on IV Vancomycin and ceftazipime (received 1g until 10/25 and was then switched to increased dose of 2g). Not much improvement in leukocytosis since. UA with only moderate leukocytes and few bacteria. Urine cx cancelled by chart reivew. Sputum culture significant for normal respiratory flora. CXR negative for PNA. Initial blood culture negative x 3 day. Repeat blood culture pending. Dark malodorous secretions from Trach and Peg site, increased by afternoon (dripping from  Peg site). No vulvar ulcer appreciated but small  superficial ulcer near rectum without signs of infection. Consider further imaging and/or adding anaerobic coverage if no improvement in fevers and leukocytosis. Could consider surgery eval for Peg site due to increased secretions if still present in AM. Will refer to primary concerning changes in treatment at this time. - Continue IV Vancomycin and Ceftazidime  - Will consult neuro/gen surg if no improvement tomorrow - Chest x-ray in AM - Obtain urine culture    Hypertension Current on Coreg, hydralazine, Norvasc, and HCTZ. BP ON 124-173/59-82 (159/73).  Can consider re-adding Losartan if no improvement. Per Neuro, BP goal 130-150.  - Continue Coreg, hydralazine, Norvasc -  Continue HCTZ 25 mg  - Work to titrate off clevidipine infusion (currently at 4 mg/hr) - Consider adding Losartan and/or Spironolactone if no improvement (patient has history of diastolic dysfunction with LVH) - Continue to monitor  Pressure Ulcer:  Per CCM, Stage II pressure ulcer on vulva likely 2/2 to pure wick device. Foley catheter placed on 10/23 to prevent moisture injury. Vulvar ulcer not appreciated on exam. Noted superficial ulcer on peroneum near rectum that did not appear infected.  - Continue Desitin - Wound care consulted  - Will continue to monitor   Anemia: stable s/p 2U pRBC Hgb 9.4>8.8>8.2>7.9>7.7>7.0>8.8.  Active vaginal bleed. Patient is post-menopausal. Prior biopsy in December 2017 was normal, negative predictive value for these specimens is ~1 year. Iron panel significant for iron deficiency. Patient transfused with 2U pRBC on 10/25. Anemia likely due to acute bleed and iron deficiency. Will continue to monitor closely.  - Continue iron supplement - Will continue to monitor closely - Transfuse <7 - Consider reducing Megace to 40 mg BID   IDT2DM: BG in the 200-250's. Currently on Novolog 8U q4hours and SSI and Lantus 20U BID. Will increase Lantus to 22U BID - Increase Lantus to 22U BID -  Continue Novolog per order - Continue to monitor CBG's  Abnormal Vaginal Bleeding: Has been following with Gyn. Was on home Megace, resumed during hospital admission. Per nurse, bleeding has improved. Noted on exam this morning. Hgb dropped requiring 2U pRBC. Will continue to monitor closely.  - Continue to monitor  - AM CBC  - Consider dose reduction to Megace 40 mg BID   FEN/GI  PEG placed on 10/21. Appears well healing, although malodorous dark secretions. Patient tolerating feeds well. - Continue tube feedings per PEG (45 cc/hr) per nutrition  Hyperlipidemia: LDL 174. Was on home Lipitor 20mg . Increased during admission. - Continue Lipitor 80mg  QD  DVT Prophylaxis: Consider restarting heparin prophylaxis after a few additional days of Megace, held due to AUB   Fluids:  . sodium chloride Stopped (08/28/18 0514)  . sodium chloride 50 mL/hr at 08/31/18 0600  . cefTAZidime (FORTAZ)  IV 2 g (08/31/18 1820)  . clevidipine 4 mg/hr (08/31/18 1121)  . feeding supplement (GLUCERNA 1.2 CAL) 1,000 mL (08/30/18 1744)  . vancomycin 750 mg (08/31/18 1705)  Electrolytes: Replace PRN Nutrition: Glucerna 74mL/hr per nutrition GI ppx: Protonix DVT ppx: SCD's, Brilenta   Future labs: CBC, BMP Disposition: LTAC pending improvement   Medications: Scheduled Meds: . amLODipine  10 mg Per Tube Daily  . aspirin  81 mg Per Tube Daily  . atorvastatin  80 mg Per Tube q1800  . carvedilol  25 mg Per Tube BID WC  . chlorhexidine  15 mL Mouth Rinse BID  . docusate  100 mg Per Tube Daily  . feeding supplement (  PRO-STAT SUGAR FREE 64)  60 mL Per Tube BID  . ferrous sulfate  325 mg Oral TID  . free water  200 mL Per Tube Q8H  . hydrALAZINE  25 mg Oral Q8H  . [START ON 08/19/2018] hydrochlorothiazide  50 mg Per Tube Daily  . insulin aspart  0-20 Units Subcutaneous Q4H  . insulin aspart  8 Units Subcutaneous Q4H  . insulin glargine  22 Units Subcutaneous BID  . liver oil-zinc oxide   Topical QID  .  mouth rinse  15 mL Mouth Rinse q12n4p  . megestrol  80 mg Oral BID  . multivitamin  15 mL Per Tube Daily  . pantoprazole sodium  40 mg Per Tube Daily  . ticagrelor  90 mg Per Tube BID   Continuous Infusions: . sodium chloride Stopped (08/28/18 0514)  . sodium chloride 50 mL/hr at 08/31/18 0600  . cefTAZidime (FORTAZ)  IV 2 g (08/31/18 1820)  . clevidipine 4 mg/hr (08/31/18 1121)  . feeding supplement (GLUCERNA 1.2 CAL) 1,000 mL (08/30/18 1744)  . vancomycin 750 mg (08/31/18 1705)   PRN Meds: sodium chloride, acetaminophen (TYLENOL) oral liquid 160 mg/5 mL, albuterol, bisacodyl, diphenhydrAMINE-zinc acetate, hydrALAZINE, HYDROcodone-acetaminophen, ondansetron (ZOFRAN) IV, sennosides  ================================================= ================================================= Subjective:  Patient increased work of breathing this morning. Reports no pain. Primarily nonverbal, able to nod at times to respond.  Objective: Vital Signs Temp:  [98.8 F (37.1 C)-100.3 F (37.9 C)] 98.8 F (37.1 C) (10/27 1600) Pulse Rate:  [79-99] 85 (10/27 1815) Resp:  [16-44] 40 (10/27 1815) BP: (124-171)/(59-101) 155/84 (10/27 1815) SpO2:  [95 %-100 %] 100 % (10/27 1815) FiO2 (%):  [28 %-35 %] 35 % (10/27 1600) Weight:  [128.4 kg] 128.4 kg (10/27 0500)  Intake/Output 10/26 0701 - 10/27 0700 In: 2004.7 [I.V.:1404.7; IV Piggyback:600] Out: 2360 [Urine:2360]  Physical Exam:  Gen:  non-toxic, uncomfortable appearing  Skin: Warm and dry. No obvious rashes, lesions, or trauma. Small superficial ulcer located on peroneum that is non-erythematous or warm. No vulvar ulcer appreciated. Peg site well appearing but (+) dark brown malodorous discharge  HEENT: Purulent dark brown-red secretions at trach site, tachypneic, coarse bilateral breath sounds  CV: RRR. <2s capillary refill bilaterally.  RP & DPs 2+ bilaterally. trace BLEE. Resp:Unable to appreciate lung sounds due to transmitted upper  respiratory sounds Abd: NTND on palpation to all 4 quadrants.   Psych: Cooperative with exam. Pleasant. Makes eye contact. Nonverbal  Laboratory: Recent Labs  Lab 08/29/18 0614 08/30/18 0052 08/31/18 0114  WBC 19.0* 20.8* 19.7*  HGB 7.0* 8.8* 8.7*  HCT 23.8* 29.7* 28.5*  PLT 489* 553* 535*   Recent Labs  Lab 08/29/18 0614 08/30/18 0052 08/31/18 0114  NA 139 138 139  K 4.2 4.5 3.8  CL 105 102 105  CO2 25 25 23   BUN 37* 37* 41*  CREATININE 1.12* 1.19* 1.27*  CALCIUM 11.1* 11.6* 11.4*  GLUCOSE 229* 272* 259*    Imaging/Diagnostic Tests: Dg Chest 1 View  Result Date: 08/30/2018 CLINICAL DATA:  Cough. EXAM: CHEST  1 VIEW COMPARISON:  August 25, 2018 FINDINGS: Stable cardiomegaly. Mild interstitial opacities likely mild edema or pulmonary venous congestion. Stable tracheostomy tube. No other interval changes or acute abnormalities. IMPRESSION: Cardiomegaly and mild pulmonary venous congestion/edema. No other change. Electronically Signed   By: Dorise Bullion III M.D   On: 08/30/2018 09:01     Danna Hefty, DO 08/31/2018, 6:27 PM PGY-1, [place] Intern pager: [phone], text pages welcome

## 2018-08-31 NOTE — Progress Notes (Signed)
NAME:  Andrea [name], MRN:  [id number], DOB:  05-06-1961, LOS: 61 ADMISSION DATE:  08/11/2018, CONSULTATION DATE:  08/05/2018 REFERRING MD:  Dr. Rory Percy, CHIEF COMPLAINT:  CP/ Acute CVA  Brief History   62 yoF w/poorly controlled HTN and IDDM presenting with CP and left arm pain w/BP 220/117. Initial CTH neg.  Cardiac workup negative for acute event thus far, cardiology following.  Code stroke initiated for R hemiplegia, dysarthria and right facial droop.  Out of window for TPA.  Requiring cleviprex for BP control.  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  Evolving pontine stroke on MRI. Intubated for neuro IR s/p stenting and angioplasty with 80-90% patency.  Extubated post procedure but due to insufficency placed on BiPAP and brought to ICU.    Past Medical History  HTN, IDDM, hypothyroidism s/p ablation  Significant Hospital Events   10/9 present to [facility] ED -transferred to [facility] 10/9 basilar artery stenting 10/9 intubated urgently 6h post procedure for inability to protect airway. 10/10 CTA for declining exam - evolving pontine infarct, stent deemed to be patent 10/14 Brillinta stopped and ASA/ heparin started to in anticipation of tracheostomy on Monday. 10/14 through 10/19: Neuro exam has been stable sodium increasing in spite of free water via tube, providing supportive care awaiting trach 10/20: Sodium down to 151 from 156 clinically looks the same 10/21: Sodium down to 148, sputum culture sent for purulent tracheal secretions, awaiting trach and PEG.  Purulent bronchial secretions from right upper lobe sent for BAL 10/22: Tracheostomy and PEG completed on the 21st tolerated well.  Spiking low-grade fever white cells continued to climb starting empiric vancomycin and ceftaz.  Sodium improved down to 146.  Blood pressure improving but still not at goal added hydrochlorothiazide.  Resumed Brilinta.  10/23: Sodium normal.  Backing down on free water.  Glycemic control  challenging, increased basal NovoLog in addition to Lantus and sliding scale.  Still spiking fever, white blood cell count climbing, sending blood cultures.  Foley catheter being placed for pressure ulcer caused by pure wick device 10/27 Foul smelling dark trach secretions, PEG secretions  Consults: date of consult/date signed off & final recs:  Cards 10/10 Neurology 10/10 PCCM 10/10  Procedures (surgical and bedside):  10/10 Neuro IR >> s/p stent and angioplasty of distal basilar artery  10/10 ETT for procedure 10/10 ETT (reintubated) >> removed 10/21: Size 6 tracheostomy placed by Dr. Nelda Marseille 10/26 changed to #4 trach cuffless   Significant Diagnostic Tests:  10/10  CTH >>  No acute intracranial abnormalities. 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 No definite acute thrombus identified. Severe stenosis left posterior cerebral artery and moderate stenosis right posterior cerebral artery. Mild stenosis left MCA and moderate stenosis left MCA bifurcation. No significant carotid or vertebral artery stenosis in the neck. 10/10 MRI >> evolving pontine infarct. 10/11 ECHO >> normal LVSF with severe LVH.  Micro Data:  10/10 MRSA PCR >> negative 10/21 Sputum culture >> nl resp flora  10/23 Blood cultures x 2 >> 10/27 Sputum culture >>  Antimicrobials:  10/10 cefazolin preop 10/22 Vancomycin >> 10/22 Ceftaz >>  Subjective:  RN reports intermittent periods of tachypnea / sweating.  Remains on ATC. Foul odor from trach/PEG.  On empiric abx.  Objective   Blood pressure (!) 143/90, pulse 84, temperature 99.2 F (37.3 C), temperature source Axillary, resp. rate (!) 41, height _0  (1.626 m), weight 128.4 kg, last menstrual period 07/26/2018, SpO2 98 %.    FiO2 (%):   [28 %] 28 %   Intake/Output Summary (Last 24 hours) at 08/31/2018 1041 Last data filed at 08/31/2018 0600 Gross per 24 hour  Intake 2004.65 ml  Output 2160 ml  Net -155.35 ml   Filed Weights   08/29/18 0500 08/30/18 0500 08/31/18 0500  Weight: 128 kg 129.4 kg 128.4 kg    Examination: General: chronically ill appearing female lying in bed in NAD  HEENT: MM pink/moist, trach midline with dark brown /tan secretions, fetid odor  Neuro: Awakens to voice, makes eye contact, does not move/follow commands  CV: s1s2 rrr, no m/r/g PULM: even/non-labored, lungs bilaterally coarse  GI: soft, non-tender, bsx4 active, PEG with dark brown/tan secretions at site  Extremities: warm/dry, no edema  Skin: no rashes or lesions  Resolved Hospital Problem list     Assessment & Plan:   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   P: Plan per Neurology/Primary  Will need SNF placement at some point  ASA, Brilinta per primary    Respiratory failure/ hypoxemia due to inability to protect airway from cva .   -Expected airway difficulties with location of stroke.  P: PCCM will continue to follow for trach care Assess sputum culture  ABG per FPTS   Increased tracheal secretions, nasal discharge, and leukocytosis -Worrisome for sinusitis and for tracheobronchitis: BAL sent from right upper lobe on 10/21  - fevers are low grade, culture negative from RUL P: Continue ceftaz, vanco Follow cultures  Hypertensive Crisis -improved P: Continue coreg, hydralazine, norvasc, hctz per primary  Wean cleviprex to off   Pressure ulcer -Stage II on vulva area felt secondary to pure wick device P: Continue wound care, foley catheter   Type 2 diabetes P: Per FPTS  Lantus 20 units BID, SSI, meal coverage  Hypernatremia, with increased creatinine.   -Both  normalized with free water replacement  P: Monitor Na Free water 200 ml Q8   Anemia without evidence of bleeding P: Per primary  Follow CBC  Disposition / Summary of Today's Plan 08/31/18   PCCM will continue to follow for trach care.  Defer medical management to FPTS.     Best Practice  Diet: TFs Pain/Anxiety/Delirium protocol (if indicated): RASS goal 0 effective 10/22 VAP protocol (if indicated): Ordered DVT prophylaxis: SCDs  GI prophylaxis: Famotidine Hyperglycemia protocol: SSI, lantus Mobility: OOB as tolerated  Code Status: Full  Family: none available am 10/27    Labs ( personally  Reviewed)  CBC: Recent Labs  Lab 08/27/18 0354 08/28/18 0357 08/29/18 0614 08/30/18 0052 08/31/18 0114  WBC 20.6* 19.4* 19.0* 20.8* 19.7*  HGB 7.9* 7.7* 7.0* 8.8* 8.7*  HCT 27.9* 25.9* 23.8* 29.7* 28.5*  MCV 82.1 78.2* 78.8* 80.1 80.7  PLT 460* 475* 489* 553* 535*    Basic Metabolic Panel: Recent Labs  Lab 08/27/18 0354 08/28/18 0357 08/29/18 0614 08/30/18 0052 08/31/18 0114  NA 141 137 139 138 139  K 3.7 3.3* 4.2 4.5 3.8  CL 109 104 105 102 105  CO2 _1 GLUCOSE 253* 219* 229* 272* 259*  BUN 39* 33* 37* 37* 41*  CREATININE 1.18* 1.06*  1.12* 1.19* 1.27*  CALCIUM 11.2* 10.9* 11.1* 11.6* 11.4*  MG  --   --  2.2  --   --   PHOS  --   --  2.9  --   --    GFR: Estimated Creatinine Clearance: 65 mL/min (A) (by C-G formula based on SCr of 1.27 mg/dL (H)). Recent Labs  Lab 08/28/18 0357 08/29/18 0614 08/30/18 0052 08/31/18 0114  WBC 19.4* 19.0* 20.8* 19.7*    Liver Function Tests: No results for input(s): AST, ALT, ALKPHOS, BILITOT, PROT, ALBUMIN in the last 168 hours. No results for input(s): LIPASE, AMYLASE in the last 168 hours. No results for input(s): AMMONIA in the last 168 hours.  ABG    Component Value Date/Time   PHART 7.334 (L) 08/11/2018 2259   PCO2ART 41.9 08/09/2018 2259   PO2ART 275.0 (H) 08/09/2018 2259   HCO3 22.3 08/10/2018 2259    TCO2 24 08/19/2018 2259   ACIDBASEDEF 3.0 (H) 08/12/2018 2259   O2SAT 100.0 08/29/2018 2259     Coagulation Profile: Recent Labs  Lab 09/04/2018 0646  INR 1.20    Cardiac Enzymes: No results for input(s): CKTOTAL, CKMB, CKMBINDEX, TROPONINI in the last 168 hours.  HbA1C: Hgb A1c MFr Bld  Date/Time Value Ref Range Status  08/15/2018 05:12 AM 11.4 (H) 4.8 - 5.6 % Final    Comment:    (NOTE) Pre diabetes:          5.7%-6.4% Diabetes:              >6.4% Glycemic control for   <7.0% adults with diabetes   01/12/2017 02:43 AM 12.8 (H) 4.8 - 5.6 % Final    Comment:    (NOTE)         Pre-diabetes: 5.7 - 6.4         Diabetes: >6.4         Glycemic control for adults with diabetes: <7.0     CBG: Recent Labs  Lab 08/30/18 1630 08/30/18 1912 08/30/18 2322 08/31/18 0413 08/31/18 0755  GLUCAP 312* 277* 218* Bourg, NP-C [facility] Pgr: [phone] or if no answer [phone] 08/31/2018, 10:41 AM

## 2018-08-31 NOTE — Progress Notes (Signed)
Copious purulent drainage from peg discussed with FPTS and Gen Surgery.

## 2018-08-31 NOTE — Progress Notes (Addendum)
Pt tachypneic, sweaty, mouthing "help" Coughing.Sats 100% CCM Provider and RT notified. Suctioned, CPT done, tylenol given (already had Hycet), emotional support given. Will cont to monitor very closely. Also discussed with FMTS.

## 2018-09-01 ENCOUNTER — Encounter (HOSPITAL_COMMUNITY): Payer: Self-pay | Admitting: Radiology

## 2018-09-01 ENCOUNTER — Inpatient Hospital Stay (HOSPITAL_COMMUNITY): Payer: Medicaid Other | Admitting: Certified Registered Nurse Anesthetist

## 2018-09-01 ENCOUNTER — Encounter (HOSPITAL_COMMUNITY): Admission: EM | Disposition: E | Payer: Self-pay | Source: Home / Self Care | Attending: Neurology

## 2018-09-01 ENCOUNTER — Inpatient Hospital Stay (HOSPITAL_COMMUNITY): Payer: Medicaid Other

## 2018-09-01 DIAGNOSIS — K9422 Gastrostomy infection: Secondary | ICD-10-CM

## 2018-09-01 HISTORY — PX: LAPAROTOMY: SHX154

## 2018-09-01 HISTORY — PX: GASTROSTOMY: SHX5249

## 2018-09-01 LAB — GLUCOSE, CAPILLARY
GLUCOSE-CAPILLARY: 111 mg/dL — AB (ref 70–99)
GLUCOSE-CAPILLARY: 116 mg/dL — AB (ref 70–99)
GLUCOSE-CAPILLARY: 117 mg/dL — AB (ref 70–99)
GLUCOSE-CAPILLARY: 125 mg/dL — AB (ref 70–99)
Glucose-Capillary: 119 mg/dL — ABNORMAL HIGH (ref 70–99)
Glucose-Capillary: 115 mg/dL — ABNORMAL HIGH (ref 70–99)

## 2018-09-01 LAB — CBC
HEMATOCRIT: 28.7 % — AB (ref 36.0–46.0)
Hemoglobin: 8.6 g/dL — ABNORMAL LOW (ref 12.0–15.0)
MCH: 24.5 pg — ABNORMAL LOW (ref 26.0–34.0)
MCHC: 30 g/dL (ref 30.0–36.0)
MCV: 81.8 fL (ref 80.0–100.0)
NRBC: 0.1 % (ref 0.0–0.2)
PLATELETS: 582 10*3/uL — AB (ref 150–400)
RBC: 3.51 MIL/uL — AB (ref 3.87–5.11)
RDW: 16.4 % — ABNORMAL HIGH (ref 11.5–15.5)
WBC: 17.3 10*3/uL — AB (ref 4.0–10.5)

## 2018-09-01 LAB — CULTURE, BLOOD (ROUTINE X 2)
CULTURE: NO GROWTH
Culture: NO GROWTH

## 2018-09-01 LAB — VANCOMYCIN, TROUGH: VANCOMYCIN TR: 24 ug/mL — AB (ref 15–20)

## 2018-09-01 LAB — BASIC METABOLIC PANEL
ANION GAP: 8 (ref 5–15)
BUN: 43 mg/dL — ABNORMAL HIGH (ref 6–20)
CALCIUM: 11.3 mg/dL — AB (ref 8.9–10.3)
CO2: 25 mmol/L (ref 22–32)
Chloride: 107 mmol/L (ref 98–111)
Creatinine, Ser: 1.11 mg/dL — ABNORMAL HIGH (ref 0.44–1.00)
GFR, EST NON AFRICAN AMERICAN: 54 mL/min — AB (ref >60)
Glucose, Bld: 128 mg/dL — ABNORMAL HIGH (ref 70–99)
POTASSIUM: 4.7 mmol/L (ref 3.5–5.1)
Sodium: 140 mmol/L (ref 135–145)

## 2018-09-01 LAB — SURGICAL PCR SCREEN
MRSA, PCR: NEGATIVE
STAPHYLOCOCCUS AUREUS: NEGATIVE

## 2018-09-01 SURGERY — LAPAROTOMY, EXPLORATORY
Anesthesia: General | Site: Abdomen

## 2018-09-01 MED ORDER — HYDROCHLOROTHIAZIDE 25 MG PO TABS
25.0000 mg | ORAL_TABLET | Freq: Every day | ORAL | Status: DC
  Administered 2018-09-02 – 2018-09-06 (×5): 25 mg via ORAL
  Filled 2018-09-01 (×5): qty 1

## 2018-09-01 MED ORDER — PHENYLEPHRINE 40 MCG/ML (10ML) SYRINGE FOR IV PUSH (FOR BLOOD PRESSURE SUPPORT)
PREFILLED_SYRINGE | INTRAVENOUS | Status: AC
  Filled 2018-09-01: qty 20

## 2018-09-01 MED ORDER — HYDROCHLOROTHIAZIDE 10 MG/ML ORAL SUSPENSION: 25.0000 mg | Freq: Every day | ORAL | Status: DC

## 2018-09-01 MED ORDER — PROPOFOL 10 MG/ML IV BOLUS
INTRAVENOUS | Status: AC
  Filled 2018-09-01: qty 20

## 2018-09-01 MED ORDER — FENTANYL CITRATE (PF) 100 MCG/2ML IJ SOLN
25.0000 ug | INTRAMUSCULAR | Status: DC | PRN
  Administered 2018-09-01: 25 ug via INTRAVENOUS
  Administered 2018-09-01 – 2018-09-02 (×2): 50 ug via INTRAVENOUS
  Administered 2018-09-02 (×2): 100 ug via INTRAVENOUS
  Administered 2018-09-02: 50 ug via INTRAVENOUS
  Administered 2018-09-02 (×2): 100 ug via INTRAVENOUS
  Administered 2018-09-03: 50 ug via INTRAVENOUS
  Administered 2018-09-04 (×3): 100 ug via INTRAVENOUS
  Filled 2018-09-01 (×12): qty 2

## 2018-09-01 MED ORDER — FENTANYL CITRATE (PF) 250 MCG/5ML IJ SOLN
INTRAMUSCULAR | Status: AC
  Filled 2018-09-01: qty 5

## 2018-09-01 MED ORDER — VANCOMYCIN HCL 500 MG IV SOLR
500.0000 mg | Freq: Two times a day (BID) | INTRAVENOUS | Status: DC
  Administered 2018-09-02 – 2018-09-03 (×3): 500 mg via INTRAVENOUS
  Filled 2018-09-01 (×3): qty 500

## 2018-09-01 MED ORDER — IOHEXOL 300 MG/ML  SOLN
100.0000 mL | Freq: Once | INTRAMUSCULAR | Status: AC | PRN
  Administered 2018-09-01: 100 mL via INTRAVENOUS

## 2018-09-01 MED ORDER — ONDANSETRON HCL 4 MG/2ML IJ SOLN
INTRAMUSCULAR | Status: DC | PRN
  Administered 2018-09-01: 4 mg via INTRAVENOUS

## 2018-09-01 MED ORDER — PROPOFOL 10 MG/ML IV BOLUS
INTRAVENOUS | Status: DC | PRN
  Administered 2018-09-01: 120 mg via INTRAVENOUS

## 2018-09-01 MED ORDER — ROCURONIUM BROMIDE 50 MG/5ML IV SOSY
PREFILLED_SYRINGE | INTRAVENOUS | Status: AC
  Filled 2018-09-01: qty 5

## 2018-09-01 MED ORDER — FENTANYL CITRATE (PF) 100 MCG/2ML IJ SOLN
INTRAMUSCULAR | Status: DC | PRN
  Administered 2018-09-01: 50 ug via INTRAVENOUS
  Administered 2018-09-01 (×2): 100 ug via INTRAVENOUS

## 2018-09-01 MED ORDER — LACTATED RINGERS IV SOLN
INTRAVENOUS | Status: DC
  Administered 2018-09-01: 16:00:00 via INTRAVENOUS

## 2018-09-01 MED ORDER — PHENYLEPHRINE HCL 10 MG/ML IJ SOLN
INTRAMUSCULAR | Status: DC | PRN
  Administered 2018-09-01 (×3): 80 ug via INTRAVENOUS

## 2018-09-01 MED ORDER — CHLORHEXIDINE GLUCONATE 0.12% ORAL RINSE (MEDLINE KIT)
15.0000 mL | Freq: Two times a day (BID) | OROMUCOSAL | Status: DC
  Administered 2018-09-02 – 2018-09-23 (×43): 15 mL via OROMUCOSAL

## 2018-09-01 MED ORDER — ATROPINE SULFATE 1 MG/10ML IJ SOSY
PREFILLED_SYRINGE | INTRAMUSCULAR | Status: AC
  Filled 2018-09-01: qty 10

## 2018-09-01 MED ORDER — ROCURONIUM BROMIDE 50 MG/5ML IV SOSY
PREFILLED_SYRINGE | INTRAVENOUS | Status: AC
  Filled 2018-09-01: qty 10

## 2018-09-01 MED ORDER — 0.9 % SODIUM CHLORIDE (POUR BTL) OPTIME
TOPICAL | Status: DC | PRN
  Administered 2018-09-01: 1000 mL

## 2018-09-01 MED ORDER — ORAL CARE MOUTH RINSE
15.0000 mL | OROMUCOSAL | Status: DC
  Administered 2018-09-02 – 2018-09-23 (×191): 15 mL via OROMUCOSAL

## 2018-09-01 MED ORDER — HYDRALAZINE HCL 20 MG/ML IJ SOLN: 20.0000 mg | Freq: Four times a day (QID) | INTRAMUSCULAR | Status: DC | PRN

## 2018-09-01 MED ORDER — ROCURONIUM BROMIDE 100 MG/10ML IV SOLN
INTRAVENOUS | Status: DC | PRN
  Administered 2018-09-01: 30 mg via INTRAVENOUS
  Administered 2018-09-01: 50 mg via INTRAVENOUS
  Administered 2018-09-01: 10 mg via INTRAVENOUS

## 2018-09-01 MED ORDER — LIDOCAINE 2% (20 MG/ML) 5 ML SYRINGE
INTRAMUSCULAR | Status: AC
  Filled 2018-09-01: qty 5

## 2018-09-01 MED ORDER — LABETALOL HCL 5 MG/ML IV SOLN
40.0000 mg | INTRAVENOUS | Status: DC | PRN
  Administered 2018-09-05: 20 mg via INTRAVENOUS
  Filled 2018-09-01: qty 8

## 2018-09-01 MED ORDER — LIDOCAINE HCL (CARDIAC) PF 100 MG/5ML IV SOSY
PREFILLED_SYRINGE | INTRAVENOUS | Status: DC | PRN
  Administered 2018-09-01: 100 mg via INTRAVENOUS

## 2018-09-01 MED ORDER — ONDANSETRON HCL 4 MG/2ML IJ SOLN
INTRAMUSCULAR | Status: AC
  Filled 2018-09-01: qty 2

## 2018-09-01 SURGICAL SUPPLY — 64 items
BLADE CLIPPER SURG (BLADE) IMPLANT
BNDG GAUZE ELAST 4 BULKY (GAUZE/BANDAGES/DRESSINGS) ×2 IMPLANT
BRR ADH 5X3 SEPRAFILM 6 SHT (MISCELLANEOUS)
CANISTER SUCT 3000ML PPV (MISCELLANEOUS) ×3 IMPLANT
CATH GASTROSTOMY 20FR (CATHETERS) ×2 IMPLANT
CATH MALECOT BARD  24FR (CATHETERS)
CATH MALECOT BARD 24FR (CATHETERS) IMPLANT
CHLORAPREP W/TINT 26ML (MISCELLANEOUS) ×1 IMPLANT
COVER SURGICAL LIGHT HANDLE (MISCELLANEOUS) ×3 IMPLANT
COVER WAND RF STERILE (DRAPES) ×3 IMPLANT
DRAIN PENROSE 1/2X12 LTX STRL (WOUND CARE) ×2 IMPLANT
DRAPE LAPAROSCOPIC ABDOMINAL (DRAPES) ×3 IMPLANT
DRAPE UTILITY XL STRL (DRAPES) ×4 IMPLANT
DRAPE WARM FLUID 44X44 (DRAPE) ×3 IMPLANT
DRSG OPSITE POSTOP 4X10 (GAUZE/BANDAGES/DRESSINGS) IMPLANT
DRSG OPSITE POSTOP 4X8 (GAUZE/BANDAGES/DRESSINGS) IMPLANT
ELECT BLADE 6.5 EXT (BLADE) IMPLANT
ELECT CAUTERY BLADE 6.4 (BLADE) ×3 IMPLANT
ELECT REM PT RETURN 9FT ADLT (ELECTROSURGICAL) ×3
ELECTRODE REM PT RTRN 9FT ADLT (ELECTROSURGICAL) ×1 IMPLANT
GAUZE SPONGE 4X4 12PLY STRL (GAUZE/BANDAGES/DRESSINGS) ×3 IMPLANT
GLOVE BIOGEL PI IND STRL 8 (GLOVE) ×1 IMPLANT
GLOVE BIOGEL PI INDICATOR 8 (GLOVE) ×2
GLOVE ECLIPSE 7.5 STRL STRAW (GLOVE) ×3 IMPLANT
GOWN STRL REUS W/ TWL LRG LVL3 (GOWN DISPOSABLE) ×2 IMPLANT
GOWN STRL REUS W/ TWL XL LVL3 (GOWN DISPOSABLE) IMPLANT
GOWN STRL REUS W/TWL LRG LVL3 (GOWN DISPOSABLE) ×12
GOWN STRL REUS W/TWL XL LVL3 (GOWN DISPOSABLE) ×3
KIT BASIN OR (CUSTOM PROCEDURE TRAY) ×3 IMPLANT
KIT TURNOVER KIT B (KITS) ×3 IMPLANT
LIGASURE IMPACT 36 18CM CVD LR (INSTRUMENTS) IMPLANT
NS IRRIG 1000ML POUR BTL (IV SOLUTION) ×6 IMPLANT
PACK GENERAL/GYN (CUSTOM PROCEDURE TRAY) ×3 IMPLANT
PAD ABD 8X10 STRL (GAUZE/BANDAGES/DRESSINGS) ×2 IMPLANT
PAD ARMBOARD 7.5X6 YLW CONV (MISCELLANEOUS) ×3 IMPLANT
PLUG CATH AND CAP STER (CATHETERS) IMPLANT
SEPRAFILM PROCEDURAL PACK 3X5 (MISCELLANEOUS) IMPLANT
SPECIMEN JAR LARGE (MISCELLANEOUS) IMPLANT
SPONGE LAP 18X18 X RAY DECT (DISPOSABLE) IMPLANT
STAPLER VISISTAT 35W (STAPLE) ×3 IMPLANT
SUCTION POOLE TIP (SUCTIONS) ×3 IMPLANT
SUT ETHILON 3 0 FSL (SUTURE) ×2 IMPLANT
SUT NOVA 1 T20/GS 25DT (SUTURE) ×4 IMPLANT
SUT PDS AB 1 TP1 96 (SUTURE) ×2 IMPLANT
SUT SILK 2 0 SH (SUTURE) ×4 IMPLANT
SUT SILK 2 0 SH CR/8 (SUTURE) ×3 IMPLANT
SUT SILK 2 0 TIES 10X30 (SUTURE) ×1 IMPLANT
SUT SILK 3 0 (SUTURE)
SUT SILK 3 0 SH CR/8 (SUTURE) ×1 IMPLANT
SUT SILK 3 0 TIES 10X30 (SUTURE) ×1 IMPLANT
SUT SILK 3-0 18XBRD TIE 12 (SUTURE) ×1 IMPLANT
SUT VIC AB 1 CT1 18XCR BRD 8 (SUTURE) ×1 IMPLANT
SUT VIC AB 1 CT1 8-18 (SUTURE)
SYR CONTROL 10ML LL (SYRINGE) ×2 IMPLANT
SYRINGE TOOMEY DISP (SYRINGE) ×3 IMPLANT
TAPE CLOTH SURG 4X10 WHT LF (GAUZE/BANDAGES/DRESSINGS) ×2 IMPLANT
TOWEL OR 17X24 6PK STRL BLUE (TOWEL DISPOSABLE) ×1 IMPLANT
TOWEL OR 17X26 10 PK STRL BLUE (TOWEL DISPOSABLE) ×3 IMPLANT
TRAY FOLEY MTR SLVR 16FR STAT (SET/KITS/TRAYS/PACK) IMPLANT
TUBE GASTROSTOMY 18F (CATHETERS) IMPLANT
TUBE MOSS GAS 18FR (TUBING) IMPLANT
TUBE TRACH SHILEY  6 DIST  CUF (TUBING) ×2 IMPLANT
TUBE TRACH SHILEY 4 DIST CUF (TUBING) ×2 IMPLANT
YANKAUER SUCT BULB TIP NO VENT (SUCTIONS) IMPLANT

## 2018-09-01 NOTE — Progress Notes (Signed)
Pharmacy Antibiotic Note  Andrea [name] is a 57 y.o. female admitted on 08/31/2018 with an acute ischemic stroke. She currently trach-dependent and has a PEG. Patient is on vancomycin and ceftazidime due to concern for HCAP.   Currently on day #7 of therapy. Vancomycin trough today approximately 10.5 hours after last dose came back supratherapeutic at 24. Received approximately 45 minutes of 1 hour dose tonight.  WBC remains elevated at 17.3. Tmax 100.1. Scr 1.11 (normCrCl 74 mL/min). Patient is back in OR for I&D and replacement of G-tube tonight.   Plan: -Continue ceftazidime to 2 gm IV Q 8 hours  -Change vancomycin to 500 mg IV Q 12 hours starting on 10/29@0600  -Monitor renal fx, cultures, VT at steady state  Height: 5\' 4"  (162.6 cm) Weight: 283 lb 8.2 oz (128.6 kg) IBW/kg (Calculated) : 54.7  Temp (24hrs), Avg:99.9 F (37.7 C), Min:99.6 F (37.6 C), Max:100.1 F (37.8 C)  Recent Labs  Lab 08/28/18 0357 08/29/18 0614  08/30/18 0052 08/30/18 0951 08/31/18 0114 08/30/2018 0525 08/30/2018 1434  WBC 19.4* 19.0*  --  20.8*  --  19.7* 17.3*  --   CREATININE 1.06* 1.12*  --  1.19*  --  1.27* 1.11*  --   VANCOTROUGH  --   --    < >  --  26*  --   --  24*   < > = values in this interval not displayed.    Antimicrobials this admission: 10/22 vancomycin > 10/22 ceftazidime >  Dose adjustments this admission: 10/26: 11 hr VT 26 on Vanc 1 gm IV Q 12 hours>> decrease to 750 mg IV Q 12 h 10/28: 10.5 VT 24 on Vanc 750 mg IV Q 12 hours>> decrease to 500 mg IV Q 12 h  Microbiology results: 10/10 mrsa pcr: neg 10/21 TA: normal flora  10/23 BCx>> ngtd   Doylene Canard, PharmD Clinical Pharmacist  Pager: [phone] Phone: [phone] If after 3:30pm, please refer to [facility] for unit-specific pharmacist

## 2018-09-01 NOTE — Consult Note (Addendum)
[place] Nurse wound follow-up consult note Reason for Consult: Re-assessment of pressure injuries to left and right labia; refer to previous consult note on 10/23. Wound type: stage 2 MDRPI to inner labia from previous Purewick; this has been discontinued to promote healing and a foley is currently inserted.  Locations are slowly decreasing in size. Pt is having a mod amt vaginal bleeding and staff is unable to keep area clean and dry.  There are no topical treatments which can be applied which would not become frequently soiled with blood. Measurement: Left inner labia .3X.2X.1cm, 100%  pink  Right inner labia 1.8X1X.1cm, 100% pink Continue barrier cream to protect and repel moisture to affected areas.  No family present to discuss plan of care.  [place] team will continue to assess site weekly. Julien Girt MSN, RN, [place], [place], [place]

## 2018-09-01 NOTE — Progress Notes (Signed)
  Speech Language Pathology Treatment: [name] Speaking valve  Patient Details Name: Andrea [name] MRN: [id number] DOB: [birth year] Today's Date: 08/11/2018 Time: 1145-1200 SLP Time Calculation (min) (ACUTE ONLY): 15 min  Assessment / Plan / Recommendation Clinical Impression  Very brief session due to patient going down for CT. Pt alert, communicating with family member via Yes/No questions.  SLP placed PMSV with audible redirection of air to upper airway, but still quite restricted. Assist from PT/OT to reposition pt very helpful. Head in neutral with chin off of trach allowed freer air movement. Pt able to phonate and articulate but struggles to coordinate the two actions. Will continue efforts tomorrow.   HPI HPI: 58 yo presented to ED with chest pain noted Rt hemiparesis in ED with acute Left paramedian brainstem infarct. Intubated 10/10 for angioplasty, extubated post procedure and reintubated. Repeat MRI with brainstem infarct extension and bil cerebellar infarcts. Peg/trach 08/10/2018.PMHx: HTN, DM, CKD      SLP Plan  Continue with current plan of care       Recommendations         Patient may use Passy-Muir Speech Valve: with SLP only PMSV Supervision: Full         Oral Care Recommendations: Oral care QID Follow up Recommendations: LTACH SLP Visit Diagnosis: Cognitive communication deficit (O11.572) Plan: Continue with current plan of care       GO               Herbie [place], MA [place] Pager [phone] Office [phone]  Lynann Beaver 08/18/2018, 1:17 PM

## 2018-09-01 NOTE — Transfer of Care (Signed)
Immediate Anesthesia Transfer of Care Note  Patient: Andrea [name]  Procedure(s) Performed: EXPLORATORY LAPAROTOMY (N/A Abdomen) INSERTION OF GASTROSTOMY TUBE (N/A Abdomen)  Patient Location: ICU  Anesthesia Type:General  Level of Consciousness: Patient remains intubated per anesthesia plan  Airway & Oxygen Therapy: Patient placed on Ventilator (see vital sign flow sheet for setting)  Post-op Assessment: Report given to RN and Post -op Vital signs reviewed and stable  Post vital signs: Reviewed and stable  Last Vitals:  Vitals Value Taken Time  BP 134/83 09/04/2018  6:15 PM  Temp    Pulse 80 08/27/2018  6:22 PM  Resp 29 09/04/2018  6:22 PM  SpO2 93 % 08/28/2018  6:22 PM  Vitals shown include unvalidated device data.  Last Pain:  Vitals:   09/02/2018 0800  TempSrc: Axillary  PainSc:       Patients Stated Pain Goal: 0 (15/83/09 4076)  Complications: No apparent anesthesia complications

## 2018-09-01 NOTE — Progress Notes (Signed)
CRITICAL VALUE ALERT  Critical Value: Vancomycin Trough  Date & Time Notied:  08/23/2018 1555  Provider Notified: Judeth Horn, MD  Orders Received/Actions taken: Stop vancomycin that is currently being administered.

## 2018-09-01 NOTE — Progress Notes (Signed)
NAME:  Andrea [name], MRN:  [id number], DOB:  [birth year], LOS: 25 ADMISSION DATE:  09/03/2018, CONSULTATION DATE:  08/10/2018 REFERRING MD:  Dr. Rory Percy, CHIEF COMPLAINT:  CP/ Acute CVA  Brief History   61 yoF w/poorly controlled HTN and IDDM presenting with CP and left arm pain w/BP 220/117. Initial CTH neg.  Cardiac workup negative for acute event thus far, cardiology following.  Code stroke initiated for R hemiplegia, dysarthria and right facial droop.  Out of window for TPA.  Requiring cleviprex for BP control.  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  Evolving pontine stroke on MRI. Intubated 10/10 for neuro IR s/p stenting and angioplasty with 80-90% patency.  Extubated post procedure but due to insufficency placed on BiPAP and brought to ICU.  Required reintubation and ultimately trach (10/21 JY).   Past Medical History  HTN, IDDM, hypothyroidism s/p ablation  Significant Hospital Events   10/9 present to [facility] ED -transferred to [facility] 10/9 basilar artery stenting 10/9 intubated urgently 6h post procedure for inability to protect airway. 10/10 CTA for declining exam - evolving pontine infarct, stent deemed to be patent 10/14 Brillinta stopped and ASA/ heparin started to in anticipation of tracheostomy on Monday. 10/14 through 10/19: Neuro exam has been stable sodium increasing in spite of free water via tube, providing supportive care awaiting trach 10/20: Sodium down to 151 from 156 clinically looks the same 10/21: Sodium down to 148, sputum culture sent for purulent tracheal secretions, awaiting trach and PEG.  Purulent bronchial secretions from right upper lobe sent for BAL 10/22: Tracheostomy and PEG completed on the 21st tolerated well.  Spiking low-grade fever white cells continued to climb starting empiric vancomycin and ceftaz.  Sodium improved down to 146.  Blood pressure improving but still not at goal added hydrochlorothiazide.  Resumed Brilinta.  10/23:  Sodium normal.  Backing down on free water.  Glycemic control challenging, increased basal NovoLog in addition to Lantus and sliding scale.  Still spiking fever, white blood cell count climbing, sending blood cultures.  Foley catheter being placed for pressure ulcer caused by pure wick device 10/27 Foul smelling dark trach secretions, PEG secretions  Consults: date of consult/date signed off & final recs:  Cards 10/10 Neurology 10/10 PCCM 10/10  Procedures (surgical and bedside):  10/10 Neuro IR >> s/p stent and angioplasty of distal basilar artery  10/10 ETT for procedure 10/10 ETT (reintubated) >> removed 10/21: Size 6 tracheostomy placed by Dr. Nelda Marseille 10/26 changed to #4 trach cuffless   Significant Diagnostic Tests:  10/10  CTH >>  No acute intracranial abnormalities. 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 No definite acute thrombus identified. Severe stenosis left posterior cerebral artery and moderate stenosis right posterior cerebral artery. Mild stenosis left MCA and moderate stenosis left MCA bifurcation. No significant carotid or vertebral artery stenosis in the neck. 10/10 MRI >> evolving pontine infarct. 10/11 ECHO >> normal LVSF with severe LVH.  Micro Data:  10/10 MRSA PCR >> negative 10/21 Sputum culture >> nl resp flora  10/23 Blood cultures x 2 >> 10/27 Sputum culture >>  Antimicrobials:  10/10 cefazolin preop 10/22 Vancomycin >> 10/22 Ceftaz >>  Subjective:  Purulent PEG drainage, awaiting CT abd.  Remains on ATC.  Objective   Blood pressure (!) 145/71, pulse 76, temperature 100.1 F (37.8 C), temperature source Axillary, resp. rate (!) 34, height _0  (1.626 m), weight 128.6 kg, last menstrual period 07/26/2018, SpO2 100 %.    FiO2  (%):  [35 %] 35 %   Intake/Output Summary (Last 24 hours) at 09/02/2018 0855 Last data filed at 08/26/2018 0800 Gross per 24 hour  Intake 5838.26 ml  Output 2900 ml  Net 2938.26 ml   Filed Weights   08/30/18 0500 08/31/18 0500 08/30/2018 0415  Weight: 129.4 kg 128.4 kg 128.6 kg    Examination: General: chronically ill appearing female, NAD on ATC  HEENT: mm moist, trach site with purulent secretions, some blood tinged sputum  Neuro: opens eyes to voice, tracks briefly, does not follow commands  CV: s1s2 rrr, no m/r/g PULM: resps even non labored on ATC< coarse throughout  GI: soft, non tender, purulent PEG site drainage  Extremities: warm and dry, no sig edema  Skin: no rashes or lesions  Resolved Hospital Problem list     Assessment & Plan:   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   P: Plan per Neurology/Primary  Will need SNF placement at some point  Continue ASA, Brilinta per primary    Respiratory failure/ hypoxemia due to inability to protect airway from cva .   -Expected airway difficulties with location of stroke.  P: PCCM will continue to follow for trach care Assess sputum culture   Increased tracheal secretions, nasal discharge, and leukocytosis -Worrisome for sinusitis and for tracheobronchitis: BAL sent from right upper lobe on 10/21   P: Continue ceftaz, vanco Follow cultures - sputum culture with GPC, GPR, rare GNR>>>  Hypertensive Crisis -improved P: Continue coreg, hydralazine, norvasc, hctz per primary  Wean cleviprex to off    Hypernatremia, with increased creatinine.   -Both normalized with free water replacement  P: Monitor Na Free water 200 ml Q8    Disposition / Summary of Today's Plan 08/15/2018   PCCM will continue to follow 2-3x/week for trach care.  Defer medical management to FPTS.      Best Practice  Diet: TFs Pain/Anxiety/Delirium protocol (if indicated): RASS goal 0 effective 10/22 VAP protocol (if indicated): Ordered DVT prophylaxis: SCDs  GI prophylaxis: Famotidine Hyperglycemia protocol: SSI, lantus Mobility: OOB as tolerated  Code Status: Full  Family: none available am 10/27    Labs ( personally  Reviewed)  CBC: Recent Labs  Lab 08/28/18 0357 08/29/18 0614 08/30/18 0052 08/31/18 0114 08/08/2018 0525  WBC 19.4* 19.0* 20.8* 19.7* 17.3*  HGB 7.7* 7.0* 8.8* 8.7* 8.6*  HCT 25.9* 23.8* 29.7* 28.5* 28.7*  MCV 78.2* 78.8* 80.1 80.7 81.8  PLT 475* 489* 553* 535* 582*    Basic Metabolic Panel: Recent Labs  Lab 08/28/18 0357 08/29/18 0614 08/30/18 0052 08/31/18 0114 08/26/2018 0525  NA 137 139 138 139 140  K 3.3* 4.2 4.5 3.8 4.7  CL 104 105 102 105 107  CO2 _1 GLUCOSE 219* 229* 272* 259* 128*  BUN 33* 37* 37* 41* 43*  CREATININE 1.06* 1.12* 1.19* 1.27* 1.11*  CALCIUM 10.9* 11.1* 11.6* 11.4* 11.3*  MG  --  2.2  --   --   --   PHOS  --  2.9  --   --   --    GFR: Estimated Creatinine Clearance: 74.4  mL/min (A) (by C-G formula based on SCr of 1.11 mg/dL (H)). Recent Labs  Lab 08/29/18 0614 08/30/18 0052 08/31/18 0114 08/22/2018 0525  WBC 19.0* 20.8* 19.7* 17.3*    Liver Function Tests: No results for input(s): AST, ALT, ALKPHOS, BILITOT, PROT, ALBUMIN in the last 168 hours. No results for input(s): LIPASE, AMYLASE in the last 168 hours. No results for input(s): AMMONIA in the last 168 hours.  ABG    Component Value Date/Time   PHART 7.476 (H) 08/31/2018 1144   PCO2ART 34.5 08/31/2018 1144   PO2ART 46.0 (L) 08/31/2018 1144   HCO3 25.4 08/31/2018 1144   TCO2 26 08/31/2018 1144   ACIDBASEDEF 3.0 (H) 08/11/2018 2259   O2SAT 84.0 08/31/2018 1144     Coagulation Profile: No results for input(s): INR, PROTIME in the last 168 hours.  Cardiac Enzymes: No results for input(s): CKTOTAL, CKMB, CKMBINDEX, TROPONINI in the last  168 hours.  HbA1C: Hgb A1c MFr Bld  Date/Time Value Ref Range Status  08/15/2018 05:12 AM 11.4 (H) 4.8 - 5.6 % Final    Comment:    (NOTE) Pre diabetes:          5.7%-6.4% Diabetes:              >6.4% Glycemic control for   <7.0% adults with diabetes   01/12/2017 02:43 AM 12.8 (H) 4.8 - 5.6 % Final    Comment:    (NOTE)         Pre-diabetes: 5.7 - 6.4         Diabetes: >6.4         Glycemic control for adults with diabetes: <7.0     CBG: Recent Labs  Lab 08/31/18 1700 08/31/18 2001 08/31/18 2306 08/28/2018 0319 08/15/2018 0832  GLUCAP 171* 146* 150* 119* 111*     Katy Shontel [place], NP 08/24/2018  8:55 AM Pager: [phone] or [phone]

## 2018-09-01 NOTE — Anesthesia Procedure Notes (Addendum)
Procedure Name: Intubation Date/Time: 08/17/2018 4:38 PM Performed by: Junia Nygren T, CRNA Pre-anesthesia Checklist: Patient identified, Emergency Drugs available, Suction available and Patient being monitored Patient Re-evaluated:Patient Re-evaluated prior to induction Oxygen Delivery Method: Circle system utilized Preoxygenation: Pre-oxygenation with 100% oxygen Induction Type: IV induction and Tracheostomy Tube type: Reinforced Tube size: 6.0 mm Number of attempts: 1 Airway Equipment and Method: Patient positioned with wedge pillow and Bougie stylet Placement Confirmation: positive ETCO2 and breath sounds checked- equal and bilateral Tube secured with: Tape Dental Injury: Teeth and Oropharynx as per pre-operative assessment

## 2018-09-01 NOTE — Progress Notes (Addendum)
FMTS Attending Daily Note: [name], MD  Pager [phone]  I have seen and examined this patient, reviewed their chart. I have discussed this patient with the resident. I agree with the resident's findings, assessment and care plan. Addendums to note below. Family Medicine Teaching Service Daily Consult Note Intern Pager: [phone]  Patient name: Andrea [name] Medical record number: [id number] Date of birth: April 27, 1961 Age: 57 y.o. Gender: female  Primary Care Provider: Danna Hefty, DO Consultants: Cardiology, Neurology, PCCM Code Status: Full code  Pt Overview and Major Events to Date:  08/18/2018 Admitted, Stenting of basilar artery, intubated 2/2 inability to protect airway 08/31/2018 CTA - evolving pontine infarct, patent stent 08/18/2018 Brilinta stopped --> ASA/Hep started 08/20/2018 Trach and PEG placed  08/26/2018 Vanc and Ceftaz started for fever, leukocytosis, purulent trach secretions; Brilenta resumed 08/27/2018 Foley catheter placed due to skin breakdown with external catheter  08/29/2018 vaginal bleeding noted, started Megace, 2u pRBC transfused 09/03/2018: Returned to OR for exploratory laparotomy and new PEG insertion.  Hospital Day: 20   Assessment and Plan: PATTRICIA [name] is a 57 y.o. female who presented wit hypertensive urgency and atypical chest pain. 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  PMHx is significant for poorly controlled HTN and IDDM, hypothyroidism s/p ablation.  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 Limited mobility in all 4 extremities. ASA and Brilenta. Planning for [facility] placement. Per neuro, due to continued decline, will initiate family meeting for goals of care discussion. - Management per primary (neurology)  Respiratory Failure/Hypoxemia: Stable  2/2 to inability to protect  airway from CVA likely due to location of stroke Patient has trach collar, off of vent. O2 Sats 96-100% without desats. RR ON 17-44. Will refer to primary at this time and continue to monitor.  - Trach care per CCM - Management per primary  Sepsis, marked by fever, tachypnea, elevated WBC.  On 10/21-10/22 patient had increased tracheal secretions and elevated temperature. WBC 14.1>20.6>19.7>17.3. Patient started on IV Vancomycin and ceftazipime (received 1g until 10/25 and was then switched to increased dose of 2g). Not much improvement in leukocytosis since. Urine cx pending although s/p broad spectrum antibiotics. Repeat blood culture pending. Repeat CXR significant for new right basilar atelectasis. Patient febrile to 101.8 and diaphoretic overnight. Complains of severe abdominal pain. General surgery consulted due to worsening malodorous secretions from PEG site, CT significant for misplaced from stomach. Plan to go to OR for I&D and replacement of g-tube. Will follow-up s/p surgery and will refer to primary concerning changes in treatment at this time. - Continue current treatment per primary - Follow-up CT, blood cx and urine cx  Hypertension: Currently on Coreg, hydralazine, Norvasc, HCTZ and Clevidipine ddt. BP ON 387-564'P systollicaly. - Management by primary (Neurology) - Will continue to monitor and assist when needed - Work to titrate off clevidipine infusion (currently at 4 mg/hr) - Consider adding Losartan and/or Spironolactone if no improvement (patient has history of diastolic dysfunction with LVH) - Continue to monitor  Pressure Ulcer:  Per CCM, Stage II pressure ulcer on inner labia likely 2/2 to pure wick device. Foley catheter placed on 10/23 to prevent moisture injury. Wound care consulted, recommend barrier cream. Wound care to assess site weekly - Continue Desitin - Wound care consulted, appreciate recs - Will continue to monitor   Anemia: stable s/p 2U pRBC Hgb  stable at  8.6.  Active vaginal bleed. Patient is post-menopausal. Prior biopsy in December 2017 was normal. Iron panel significant for iron deficiency. Patient transfused with 2U pRBC on 10/25. Anemia likely due to acute bleed and iron deficiency. Will continue to monitor closely.  - Continue iron supplement - Will continue to monitor closely - Transfuse <7 - Consider reducing Megace to 40 mg BID   IDT2DM: - BG improved in the 120-150. Lantus increased to 22U last night with AM BG 119. SSI. Will continue this and continue to monitor. - Continue Lantus 22U - Continue SSI - Continue to monitor CBG's  Abnormal Vaginal Bleeding: Has been following with Gyn. Was on home Megace, resumed during hospital admission. Noted on exam this morning. Hgb dropped requiring 2U pRBC. Will continue to monitor closely. Recommend reducing Megace 40mg  BID but will refer management to primary. - Continue to monitor  - AM CBC   FEN/GI  PEG placed on 10/21. Appears well healing, although malodorous dark secretions. Patient had been tolerating feeds well, but today PEG appeared clogged. Gen surg consulted concerning malodorous dark secretions. CT abdomen noted misplaced G-tube. Plan to perform I&D and replace g-tube today. Will follow-up. - Hold tube feedings per PEG (45 cc/hr) per nutrition - Follow-up gen surgery   Hyperlipidemia: LDL 174. Was on home Lipitor 20mg . Increased during admission. - Continue Lipitor 80mg  QD  DVT Prophylaxis: Held due to AUB, can consider restarting if hemoglobin stable after procedure today.   Fluids:  . [MAR Hold] sodium chloride 10 mL/hr at 08/31/2018 1200  . sodium chloride 50 mL/hr at 08/09/2018 1458  . [MAR Hold] cefTAZidime (FORTAZ)  IV Stopped (08/11/2018 1058)  . clevidipine Stopped (08/15/2018 1154)  . feeding supplement (GLUCERNA 1.2 CAL) Stopped (08/06/2018 0135)  . lactated ringers    . [START ON 09/02/2018] vancomycin    Electrolytes: Replace PRN Nutrition: Glucerna  41mL/hr per nutrition GI ppx: Protonix DVT ppx: SCD's, Brilenta   Future labs: CBC, BMP Disposition: LTAC pending improvement   Medications: Scheduled Meds: . [MAR Hold] amLODipine  10 mg Per Tube Daily  . [MAR Hold] aspirin  81 mg Per Tube Daily  . [MAR Hold] atorvastatin  80 mg Per Tube q1800  . [MAR Hold] carvedilol  25 mg Per Tube BID WC  . [MAR Hold] chlorhexidine  15 mL Mouth Rinse BID  . [MAR Hold] docusate  100 mg Per Tube Daily  . [MAR Hold] feeding supplement (PRO-STAT SUGAR FREE 64)  60 mL Per Tube BID  . [MAR Hold] ferrous sulfate  325 mg Oral TID  . [MAR Hold] free water  200 mL Per Tube Q8H  . [MAR Hold] hydrALAZINE  25 mg Oral Q8H  . [MAR Hold] hydrochlorothiazide  25 mg Oral Daily  . [MAR Hold] insulin aspart  0-20 Units Subcutaneous Q4H  . [MAR Hold] insulin aspart  8 Units Subcutaneous Q4H  . [MAR Hold] insulin glargine  22 Units Subcutaneous BID  . [MAR Hold] liver oil-zinc oxide   Topical QID  . [MAR Hold] mouth rinse  15 mL Mouth Rinse q12n4p  . [MAR Hold] megestrol  80 mg Oral BID  . [MAR Hold] multivitamin  15 mL Per Tube Daily  . [MAR Hold] pantoprazole sodium  40 mg Per Tube Daily  . [MAR Hold] ticagrelor  90 mg Per Tube BID   Continuous Infusions: . [MAR Hold] sodium chloride 10 mL/hr at 08/29/2018 1200  . sodium chloride 50 mL/hr at 08/10/2018 1458  . [MAR Hold] cefTAZidime ([place])  IV Stopped (08/26/2018 1058)  . clevidipine Stopped (08/13/2018 1154)  . feeding supplement (GLUCERNA 1.2 CAL) Stopped (08/20/2018 0135)  . lactated ringers    . [START ON 09/02/2018] vancomycin     PRN Meds: [MAR Hold] sodium chloride, 0.9 % irrigation (POUR BTL), [MAR Hold] acetaminophen (TYLENOL) oral liquid 160 mg/5 mL, [MAR Hold] albuterol, [MAR Hold] bisacodyl, [MAR Hold] diphenhydrAMINE-zinc acetate, [MAR Hold] hydrALAZINE, [MAR Hold] HYDROcodone-acetaminophen, [MAR Hold] labetalol, [MAR Hold] ondansetron (ZOFRAN) IV, [MAR Hold]  sennosides  ================================================= ================================================= Subjective:  Patient increased work of breathing this morning. Diaphoretic on exam. Reports severe abdominal pain. Primarily nonverbal, able to nod at times to respond.  Objective: Vital Signs Temp:  [99.6 F (37.6 C)-100.1 F (37.8 C)] 100.1 F (37.8 C) (10/28 0800) Pulse Rate:  [70-94] 89 (10/28 1521) Resp:  [17-44] 34 (10/28 1521) BP: (130-178)/(67-88) 153/84 (10/28 1521) SpO2:  [98 %-100 %] 100 % (10/28 1521) FiO2 (%):  [35 %] 35 % (10/28 1521) Weight:  [128.6 kg] 128.6 kg (10/28 0415)  Intake/Output 10/27 0701 - 10/28 0700 In: 4884.2 [I.V.:1199.6; NG/GT:2701.3; IV Piggyback:983.3] Out: 2625 [Urine:2625]  Physical Exam:  Gen:  Toxic and uncomfortable appearing  Skin: Warm and dry. No obvious rashes, lesions, or trauma.  HEENT: Purulent dark brown-red secretions at trach site, tachypneic, coarse bilateral breath sounds, clearly diaphoretic on exam with perfuse sweat along face. CV: RRR. <2s capillary refill bilaterally.  RP & DPs 2+ bilaterally. trace BLEE. Resp:Unable to appreciate lung sounds due to transmitted upper respiratory sounds Abd: distended, somewhat tender on palpation to all 4 quadrants.   Psych: Cooperative with exam. Pleasant. Makes eye contact. Nonverbal  Laboratory: Recent Labs  Lab 08/30/18 0052 08/31/18 0114 08/05/2018 0525  WBC 20.8* 19.7* 17.3*  HGB 8.8* 8.7* 8.6*  HCT 29.7* 28.5* 28.7*  PLT 553* 535* 582*   Recent Labs  Lab 08/30/18 0052 08/31/18 0114 08/13/2018 0525  NA 138 139 140  K 4.5 3.8 4.7  CL 102 105 107  CO2 25 23 25   BUN 37* 41* 43*  CREATININE 1.19* 1.27* 1.11*  CALCIUM 11.6* 11.4* 11.3*  GLUCOSE 272* 259* 128*    Imaging/Diagnostic Tests: Ct Abd Limited W/cm  Result Date: 08/06/2018 CLINICAL DATA:  Nonfunctioning gastrostomy catheter EXAM: CT ABDOMEN WITH CONTRAST TECHNIQUE: Multidetector CT imaging of the  abdomen was performed using the standard protocol following bolus administration of intravenous contrast. CONTRAST:  129mL OMNIPAQUE IOHEXOL 300 MG/ML  SOLN COMPARISON:  CT abdomen and pelvis January 11, 2017 FINDINGS: Lower chest: There is patchy consolidation in the lung bases bilaterally. There is cardiomegaly. Hepatobiliary: No focal liver lesions are appreciable. There are gallstones within the gallbladder. There is no appreciable gallbladder wall thickening. There is no biliary duct dilatation. Pancreas: No pancreatic lesions are evident. Spleen: No splenic lesions are evident. Adrenals/Urinary Tract: Adrenals bilaterally appear unremarkable. There is a cyst arising from the upper to mid left kidney posteriorly measuring 3.5 x 3.2 cm. There is a cyst arising in the mid to lower left kidney measuring 1.6 x 1.6 cm. There is a cyst more posteriorly in the mid left kidney measuring 2.1 x 1.4 cm. There is a 1 x 1 cm probable cyst more inferiorly in the left kidney. There is a cyst arising from the lateral mid right kidney measuring 1 x 1 cm. There is a 1.4 x 1.4 cm cyst in the medial mid right kidney. There is no evident hydronephrosis on either side. There is no evident renal or proximal ureteral calculus on  either side. Stomach/Bowel: There are diverticula in the sigmoid colon without diverticulitis. There is no appreciable bowel wall or mesenteric thickening. No evident bowel obstruction. There is no appreciable free intraperitoneal air. No portal venous air evident. Vascular/Lymphatic: There is no abdominal aortic aneurysm. Visualized vascular structures appear unremarkable. No adenopathy is evident in the abdomen or upper pelvis. Other: There is a gastrostomy catheter with the tip in the anterior abdominal wall immediately anterior to the rectus muscles. Air is tracking along this catheter as well as air in the anterior rectus muscles midline and slightly toward the left in the upper to mid abdominal regions.  There is soft tissue thickening in the anterior abdominal wall in this region consistent with inflammation. There is no abdominal wall abscess evident. No abscess or ascites is seen within the abdomen or upper pelvis regions. Musculoskeletal: There are foci of degenerative change in the lower thoracic and visualized lumbar regions. No blastic or lytic bone lesions are evident. The muscles appear normal beyond the air and inflammation involving the medial rectus muscles due to the malpositioned gastrostomy catheter as noted. IMPRESSION: 1. The gastrostomy catheter tip is in the posterior aspect of the abdominal wall abutting the anterior rectus muscles. The catheter is outside of the peritoneum. It is not within the stomach. There is associated abdominal wall thickening and air, but no fluid collection or abscess in the abdominal wall. 2.  Cholelithiasis. 3. No bowel obstruction. There is diverticulosis in the transverse colon. No abscess within the abdomen or upper pelvis. 4. No hydronephrosis on either side. No renal or proximal ureteral calculus on either side. These results will be called to the ordering clinician or representative by the Radiologist Assistant, and communication documented in the PACS or zVision Dashboard. Electronically Signed   By: Lowella Grip III M.D.   On: 08/05/2018 13:47   Dg Chest Port 1 View  Result Date: 08/24/2018 CLINICAL DATA:  Acute respiratory insufficiency EXAM: PORTABLE CHEST 1 VIEW COMPARISON:  08/30/2018 FINDINGS: Cardiac shadow remains enlarged. Tracheostomy tube is again seen and stable. The lungs are well aerated bilaterally with patchy atelectatic changes in the right base. Mild vascular congestion remains stable from the previous study. IMPRESSION: New right basilar atelectasis. Stable vascular congestion. Electronically Signed   By: Inez Catalina M.D.   On: 08/22/2018 07:57    Danna Hefty, DO 08/31/2018, 5:17 PM PGY-1, [place] Intern pager: [phone], text pages welcome

## 2018-09-01 NOTE — Progress Notes (Signed)
CT demonstrates that the G-tube is out of the stomach.  She is very tender and febrile.  Also, foul-smelling drainage from the wound.  Will go to the OR for I&D and Replacement of G-tube.  I discussed this case with the patient's sister who agree that we should go ahead with the procedure.  Kathryne Eriksson. Dahlia Bailiff, MD, [place] [phone] [facility] Surgery

## 2018-09-01 NOTE — Progress Notes (Signed)
PT Cancellation Note  Patient Details Name: Andrea [name] MRN: [id number] DOB: [birth year]   Cancelled Treatment:    Reason Eval/Treat Not Completed: Patient at procedure or test/unavailable(pt with transporter present for CT)   Ala Kratz B [name] 08/13/2018, 11:55 AM Elwyn Reach, PT Acute Rehabilitation Services Pager: [phone] Office: [phone]

## 2018-09-01 NOTE — Progress Notes (Signed)
Spoke with patient's sister, [name] on the phone. She will be available at 0900 on 10/29 for a family meeting with Dr. Leonie Man. She will call her other sister as well to be there. I tried calling patient's son at the number listed in the chart but was unable to reach him. Roniyah Llorens, Rande Brunt, RN

## 2018-09-01 NOTE — Op Note (Signed)
OPERATIVE REPORT  DATE OF OPERATION:  08/24/2018  PATIENT:  Andrea [name]  57 y.o. female  PRE-OPERATIVE DIAGNOSIS:  Displaced G-Tube  POST-OPERATIVE DIAGNOSIS:  Displaced G-Tube  INDICATION(S) FOR OPERATION:  Dislodged Gastrotosmy tube with abdominal wall infection  FINDINGS: The tip of the gastrostomy tube was in the subcutaneous tissue.  The gastrotomy was covered by omentum related soon as we freed up to repair the defect.  PROCEDURE:  Procedure(s): EXPLORATORY LAPAROTOMY INSERTION OF GASTROSTOMY TUBE  SURGEON:  Surgeon(s): Judeth Horn, MD  ASSISTANT: [place], PA-C  ANESTHESIA:   general  COMPLICATIONS: None  EBL: Less than 10 ml  BLOOD ADMINISTERED: none  DRAINS: Penrose drain in the Subcutaneous abscess and Gastrostomy Tube   SPECIMEN:  No Specimen  COUNTS CORRECT:  YES  PROCEDURE DETAILS: The patient was taken to the operating room and placed on the table in the supine position.  After adequate general tracheal anesthetic was administered, she was prepped and draped in the usual sterile manner exposing her abdomen.  A proper timeout was performed identifying the patient and the procedure to be performed.  We made a midline incision with the previous gastrostomy tube in place.  We took down to into the midline fascia using electrocautery.  Once we entered the peritoneal cavity there was a foul odor.  We were able to find the gastrotomy which was covered with omentum and as soon as we freed from the abdominal wall there was some very feculent smelling fluid coming from the gastrotomy itself and also from the abdominal wall.  Will control the gastrotomy using a Babcock forcep.  We subsequently passed a and half inch Penrose drain into the abdominal wall abscess which is between the fascia and the subcutaneous tissue.  This Penrose drain was brought out medial to the site of the gastrostomy tube which were used again in order to pass a silicon gastrostomy tube with a  balloon tip measuring 20 [place].  This gastrostomy tube was passed into the gastrotomy which had pursestring sutures of 2-0 silk passed around it sutured down and cinched around the tube itself.  We then attached to the abdominal wall using 2-0 silk sutures.  We inflated the balloon up to 10 cc.  It easily flushed with saline without leakage.  We sutured a Penrose drain which was medial to the gastrostomy tube in place with a 3-0 nylon and also secured down the gastrostomy tube with a 3-0 nylon.  We irrigated with saline solution and closed the fascia using interrupted figure-of-eight stitches of #1 Novafil.  The subcutaneous tissue was left open because contamination and packed with a saline soaked Kerlix gauze.  All needle counts, sponge counts, and instrument counts were correct.  PATIENT DISPOSITION:  ICU with #4 cuffed tracheostomy tube in place, hemodynamically stable   Judeth Horn 10/28/20195:53 PM

## 2018-09-01 NOTE — Progress Notes (Signed)
STROKE TEAM PROGRESS NOTE   SUBJECTIVE (INTERVAL HISTORY) Pt RN is at the bedside. Andrea [name] is on trach and peg. On Vanco and Ceftazidime for concern for HCAP.   Patient is alert and nods to simple questions, neuro exam appear stable.   Andrea [name] has been tachypnic and needing frequent suctioning with bloody tracheostomy secretions.. Andrea [name] has leakage of fluid around the PEG tube site and trauma team has seen and ordered abdominal   CT scan and tube feeds are on hold  OBJECTIVE Vitals:   08/20/2018 1400 08/10/2018 1500 08/23/2018 1521 08/09/2018 1813  BP: (!) 149/76 (!) 160/86 (!) 153/84   Pulse: 77 81 89   Resp: (!) 32 (!) 39 (!) 34   Temp:      TempSrc:      SpO2: 99% 100% 100% 92%  Weight:      Height:        CBC:  Recent Labs  Lab 08/31/18 0114 09/02/2018 0525  WBC 19.7* 17.3*  HGB 8.7* 8.6*  HCT 28.5* 28.7*  MCV 80.7 81.8  PLT 535* 582*    Basic Metabolic Panel:  Recent Labs  Lab 08/29/18 0614  08/31/18 0114 08/11/2018 0525  NA 139   < > 139 140  K 4.2   < > 3.8 4.7  CL 105   < > 105 107  CO2 25   < > 23 25  GLUCOSE 229*   < > 259* 128*  BUN 37*   < > 41* 43*  CREATININE 1.12*   < > 1.27* 1.11*  CALCIUM 11.1*   < > 11.4* 11.3*  MG 2.2  --   --   --   PHOS 2.9  --   --   --    < > = values in this interval not displayed.    Lipid Panel:     Component Value Date/Time   CHOL 256 (H) 08/15/2018 0512   TRIG 248 (H) 08/15/2018 0512   HDL 35 (L) 08/15/2018 0512   CHOLHDL 7.3 08/15/2018 0512   VLDL 50 (H) 08/15/2018 0512   LDLCALC 171 (H) 08/15/2018 0512   HgbA1c:  Lab Results  Component Value Date   HGBA1C 11.4 (H) 08/15/2018   Urine Drug Screen:     Component Value Date/Time   LABOPIA NONE DETECTED 08/08/2018 1836   COCAINSCRNUR NONE DETECTED 09/03/2018 1836   LABBENZ NONE DETECTED 08/13/2018 1836   AMPHETMU NONE DETECTED 08/31/2018 1836   THCU NONE DETECTED 08/15/2018 1836   LABBARB NONE DETECTED 08/29/2018 1836    Alcohol Level No results found for:  ETH  IMAGING  Ct Angio Head W Or Wo Contrast Ct Angio Neck W Or Wo Contrast 08/15/2018 IMPRESSION:  1. Interval basilar to left proximal PCA stenting. The density of the stent walls precludes detection of in stent stenosis; there is a degree of wasting at the mid basilar segment. There is flow in the left PCA beyond the stent such that there is presumed stent patency.  2. Known lower pontine infarct. There are new small cerebellar infarcts since brain MRI yesterday.  3. Severe left P2 segment stenosis, also seen previously.  4. Moderate atheromatous narrowing in the proximal left MCA.    Ct Angio Head W Or Wo Contrast Ct Angio Neck W Or Wo Contrast 08/21/2018 IMPRESSION:  1. 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 No definite acute thrombus identified.  2. Severe stenosis left posterior cerebral artery and moderate  stenosis right posterior cerebral artery.  3. Mild stenosis left MCA and moderate stenosis left MCA bifurcation.  4. No significant carotid or vertebral artery stenosis in the neck.    Ct Head Wo Contrast 08/18/2018 IMPRESSION:  No acute intracranial abnormalities. Mild white matter changes likely due to small vessel ischemia.    Mr Brain Wo Contrast 08/08/2018 IMPRESSION:  1. Acute nonhemorrhagic infarct involving the left paramedian brainstem. The infarct crosses midline.  2. Other periventricular and subcortical white matter disease is moderately advanced for age. This likely reflects the sequela of chronic microvascular ischemia.  3. Tapering of the dens with prominent soft tissue pannus. 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    Ct Head Code Stroke Wo Contrast 08/30/2018  IMPRESSION:  1. No acute abnormality and no change from earlier today  2. ASPECTS is 10 3.    Cerebral Angiogram 09/03/2018 S/P 4 vessel cerebral arteriogram RT CFA approach. Findings  1.Severe stenosis of  distal basilar artery 90 % ,associated with mod to severe ASVD of the mid basilar artery and Lt ANt cerebellar A. S/P stent assisted angioplasty of distal basilar artery with patency of 80 to 90 %. 2.Approx 70 % stenosis of LT ICA supraclinoid seg   MRI 08/15/18 1. Progressive acute pontine infarct. New patchy bilateral cerebellar infarction. New tiny left thalamic infarcts. 2. Preserved flow void in the stented basilar. 3. left facial/submandibular edema, please correlate with neck exam. Edited result: IMPRESSION: 1. Progressive acute pontine infarct. New patchy bilateral cerebellar infarction. New tiny left thalamic infarcts. 2. Preserved flow void in the stented basilar.  TTE - Left ventricle: The cavity size was normal. There was severe   concentric hypertrophy. Systolic function was normal. The   estimated ejection fraction was in the range of 60% to 65%. Wall   motion was normal; there were no regional wall motion   abnormalities. Doppler parameters are consistent with abnormal   left ventricular relaxation (grade 1 diastolic dysfunction). The   E/e&' ratio is <8, suggesting normal LV filling pressure. - Aortic valve: Trileaflet; mildly calcified leaflets.   Transvalvular velocity was minimally increased. There was no   regurgitation. Mean gradient (S): 12 mm Hg. - Mitral valve: Mildly thickened leaflets . There was trivial   regurgitation. - Left atrium: The atrium was normal in size. - Inferior vena cava: The vessel was dilated. The respirophasic   diameter changes were blunted (< 50%), consistent with elevated   central venous pressure. Impressions: - LVEF 60-65%, severe LVH, normal wall motion, grade 1 DD, normal   LV filling pressure, minimally increased aortic velocity without   signficant stenosis, trivial MR, normal LA size, dilated IVC.  Dg Chest Port 1 View  Result Date: 08/23/2018 CLINICAL DATA:  Encounter for fever. EXAM: PORTABLE CHEST 1 VIEW COMPARISON:   08/19/2018 FINDINGS: Endotracheal tube is 3.6 cm above the carina. Airspace densities in the right hilum and right lower lung have markedly decreased and possibly resolved. There may be a small amount of residual disease in the medial right lower lung. Vascular crowding in the hilar regions. Low lung volumes. Heart size is grossly stable and within normal limits. Nasogastric tube extends into the abdomen. Negative for pneumothorax. IMPRESSION: Airspace disease in the right lung has markedly decreased. Low lung volumes. Support apparatuses as described. Electronically Signed   By: Markus Daft M.D.   On: 08/23/2018 11:07    DG Chest 1 View 08/30/2018 IMPRESSION: Cardiomegaly and mild pulmonary venous congestion/edema. No other change.   PHYSICAL  EXAM  Temp:  [99.6 F (37.6 C)-100.1 F (37.8 C)] 100.1 F (37.8 C) (10/28 0800) Pulse Rate:  [70-94] 89 (10/28 1521) Resp:  [17-44] 34 (10/28 1521) BP: (130-178)/(67-88) 153/84 (10/28 1521) SpO2:  [92 %-100 %] 92 % (10/28 1813) FiO2 (%):  [35 %-60 %] 60 % (10/28 1813) Weight:  [128.6 kg] 128.6 kg (10/28 0415)  General -  Obese middle-aged African-American lady, in distress. Mouthing "help", tachypnic needing frequent suctioning. Andrea [name] is alert, nods appropriately.   Ophthalmologic - fundi not visualized due to noncooperation.  Cardiovascular - not tachycardic currently in the 80s RRR  Neuro - post trach, on trach collar, off sedation. Eyes open, awake, alert. No ptosis or EOMI, denies diplopia.  PERRL. blinking to visual threat bilaterally. Right gaze nystagmus is improved, can follow and track examiner across the room. Bilateral facial weakness, R>L, corneal, gag and cough present. Able to open mouth slightly but not able to protrude tongue. Quadriplegia, with left LE flicker, proximal and 0/5 distal, grimaces to pain in the right UE. DTR diminished throughout. Coordination and gait not tested.   ASSESSMENT/PLAN Andrea [name] is a 57 y.o.  female with history of difficult to control hypertension, insulin-dependent diabetes, obesity, hypothyroidism status post ablation presenting with chest discomfort, right-sided weakness, slurred speech and right facial droop. Andrea [name] did not receive IV t-PA due to late presentation. S/P stent assisted angioplasty of distal basilar artery.  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 Worsening symptoms with extension of pontine/medullary infarcts with new small bilateral cerebellar infarcts without evidence of stent re-stenosis or occlusion  Resultant quadriplegia, on trach and peg  CT head - No acute intracranial abnormalities.  CTA H&N 08/15/2018 - Extensive atherosclerotic disease in the basilar with focal critical stenosis in the mid to distal basilar. No definite acute thrombus identified. Severe stenosis left posterior cerebral artery  MRI head - Acute nonhemorrhagic infarct involving the left paramedian brainstem.   CTA H&N 08/15/2018 - stent too dense to see BA lumen, but distal flow preserved. New small cerebellar infarcts since brain MRI yesterday.  Severe left P2 segment stenosis, also seen previously.   Repeat MRI - extension of b/l pontine and upper medullary infarcts with new b/l cerebellar infarcts.  2D Echo - EF 60-65%  LDL - 174  HgbA1c - 11.1  VTE prophylaxis - heparin subq  aspirin 81 mg daily prior to admission, now resumed on Brilinta 90 mg bid and ASA 81 mg daily.   Patient counseled to be compliant with her antithrombotic medications  Ongoing aggressive stroke risk factor management  Therapy recommendations:  LTACH  Disposition:  Pending  Intracranial stenosis  BA mid to distal severe stenosis s/p BA stenting  Left PCA severe stenosis, R PCA moderate stenosis  Left MCA moderate stenosis  Uncontrolled stroke risk factors with HLD, HTN, DM  Non compliance with meds at home  Respiratory failure  S/p trach  08/30/2018  On trach collar now, tolerating well  Still has secretions, improved  CCM following with trach intermittently  Hypertension  Stable  BP goal 130-150   On po norvasc, losartan  Metoprolol changed to Coreg 25 bid, also on HCTZ 25mg  now  Also on low dose hydralazine 25mg  tid  Hyperlipidemia  Lipid lowering medication PTA:  Lipitor 20 mg daily  LDL 174, goal < 70  Increase to 80 mg daily  Continue statin at discharge  Diabetes  HgbA1c 11.4, goal < 7.0  Uncontrolled  Hyperglycemia  Lantus increase from 15U bid to 18U bid   Continue basal NovoLog increase from 4U Q4h to 6U Q4h  SSI  CBG monitoring - glucose 272 -> Diabetic nurse consult  Infection no doubt contributing to elevated glucose.  Low grade fever with leukocytosis  Tmax 100.7 -> afebrile->100.7->101-> afebrile   Leukocytosis - 12.7->12.9->14.4->14.3->16.1->20.6->19.4->19.0 -> 20.8->19.7  UA neg for UTI  CXR unremarkable, improving from prior  Sputum culture normal flora  On vanco and fortaz for empiric treatment   CCM signed off  Seen by Dr Rory Percy just after midnight for temp 101.5 -> 99.1 -> 99.9  U/A abnormal - culture pending ; Blood cultures pending ; CXR  Not C/W pneumonia  Respiratory cultures - mixed flora - final pending    Hypernatremia and elevated Cre/BUN  Na 156->156->151->148->146->141->137->139  Cre 1.41->1.40->1.22->1.16->1.22->1.18->1.06->1.12-> 1.19-> 1.27  Put on NS @ 50  On TF @ 43  Add free water 200mg  Q8h  Continue BMP monitoring  Dysphagia s/p PEG  Continue tube feeding 45 cc/h  PEG done 08/28/2018  Anemia   Hb 11.1->9.7->9.4->9.3->9.4->8.8->8.2->7.9->7.7->7.0 -> 8.8-> 8.7  Likely due to iron deficiency and acute uterine bleeding  Iron panel showed iron deficiency  Put on iron pills  PRBC transfusion 2 U Friday 08/29/2018  CBC monitoring  Abnormal uterine bleeding  Has been following with GYN  Was on megace in the  past  Will resume megace 80mg  bid  Continue ASA and brilinta for now  Vaginal bleeding much improved as per RN  Close CBC monitoring  Other Stroke Risk Factors  Obesity, Body mass index is 48.66 kg/m., recommend weight loss, diet and exercise as appropriate   Other Active Problems  Hypokalemia - on supplement for K 3.3->4.2 -> 4.5-> 3.8  Plts - 489 -> 553-> [facility] day # 18 Plan check abdominal and pelvis CT scan to look for placement of PEG tube. Continue antibiotics. Follow-up of medical issues as per critical care team. Patient's prognosis is quite poor and given her significant multiple medical problems chances of her surviving and making meaningful clinical recovery are quite bleak. Recommend family meeting in the next few days to discuss goals of care and palliative care consult. Discussed with Dr. Valeta Harms critical care medicine This patient is critically ill due to worsening brainstem infarct, BA stenosis s/p stenting, hyperglycemia, currently febrile and tachypnic requiring frequent suctioning, elevated WBCs,  +leukocutes in urine, hypertensive,  anemia and hypernatremia and at significant risk of neurological worsening, death form recurrent stroke, hemorrhagic conversion, CHF, seizure, aspiration, hemorrhagic shock.   This patient is critically ill and at significant risk of neurological worsening, death and care requires constant monitoring of vital signs, hemodynamics,respiratory and cardiac monitoring,review of multiple databases, neurological assessment, discussion with family, other specialists and medical decision making of high complexity.I  I spent 35 minutes of neurocritical care time in the care of this patient.  Antony Contras MD      To contact Stroke Continuity provider, please refer to [email or web address] After hours, contact General Neurology

## 2018-09-01 NOTE — Anesthesia Preprocedure Evaluation (Addendum)
Anesthesia Evaluation  Patient identified by MRN, date of birth, ID band  Reviewed: Allergy & Precautions, NPO status , Patient's Chart, lab work & pertinent test results  Airway Mallampati: II  TM Distance: >3 FB Neck ROM: Full    Dental   Pulmonary    Pulmonary exam normal        Cardiovascular hypertension, Pt. on medications Normal cardiovascular exam     Neuro/Psych Anxiety Depression S/P Pont[place] stroke10/08/2018    GI/Hepatic   Endo/Other  diabetes, Type 2, Oral Hypoglycemic Agents  Renal/GU Renal InsufficiencyRenal disease     Musculoskeletal   Abdominal   Peds  Hematology   Anesthesia Other Findings   Reproductive/Obstetrics                            Anesthesia Physical Anesthesia Plan  ASA: III  Anesthesia Plan: General   Post-op Pain Management:    Induction:   PONV Risk Score and Plan: 3 and Ondansetron and Treatment may vary due to age or medical condition  Airway Management Planned: Oral ETT  Additional Equipment:   Intra-op Plan:   Post-operative Plan: Extubation in OR  Informed Consent: I have reviewed the patients History and Physical, chart, labs and discussed the procedure including the risks, benefits and alternatives for the proposed anesthesia with the patient or authorized representative who has indicated his/her understanding and acceptance.     Plan Discussed with: CRNA and Surgeon  Anesthesia Plan Comments:         Anesthesia Quick Evaluation

## 2018-09-01 NOTE — Progress Notes (Addendum)
Patient ID: Andrea [name], female   DOB: 25-Jun-1961, 57 y.o.   MRN: [id number] S/P PEG placement 10/21 by Dr. Hulen Skains. I was notified by RN this AM of increased drainage around the tube. On exam, there is some purulent drainage at the site and when I flushed the tube with water there was leakage around it. Will proceed with CT A/P to evaluate further. TF held. She is currently on Vanc/Fortaz.  Georganna Skeans, MD, MPH, FACS Trauma: [phone] General Surgery: [phone]

## 2018-09-01 NOTE — Progress Notes (Signed)
Pt found wet as well as having a wet bed. Copious amt of drainage is coming from around the peg tube site. Flushed tube and it immediately leaked from around tube. Tube feeding remains off and site padded up with ABD's

## 2018-09-01 NOTE — Progress Notes (Signed)
OT Cancellation Note  Patient Details Name: Andrea [name] MRN: [id number] DOB: 1960/12/21   Cancelled Treatment:    Reason Eval/Treat Not Completed: Patient at procedure or test/ unavailable. Pt getting ready to go down for test to look at PEG tube.  [place], OTR/L Acute Rehab Services Pager [phone] Office [phone]     Almon Register 09/03/2018, 11:57 AM

## 2018-09-02 ENCOUNTER — Ambulatory Visit (HOSPITAL_COMMUNITY): Payer: Self-pay

## 2018-09-02 ENCOUNTER — Other Ambulatory Visit: Payer: Self-pay

## 2018-09-02 ENCOUNTER — Encounter (HOSPITAL_COMMUNITY): Payer: Self-pay | Admitting: General Surgery

## 2018-09-02 LAB — GLUCOSE, CAPILLARY
GLUCOSE-CAPILLARY: 102 mg/dL — AB (ref 70–99)
GLUCOSE-CAPILLARY: 129 mg/dL — AB (ref 70–99)
GLUCOSE-CAPILLARY: 129 mg/dL — AB (ref 70–99)
GLUCOSE-CAPILLARY: 131 mg/dL — AB (ref 70–99)
GLUCOSE-CAPILLARY: 198 mg/dL — AB (ref 70–99)
Glucose-Capillary: 155 mg/dL — ABNORMAL HIGH (ref 70–99)
Glucose-Capillary: 169 mg/dL — ABNORMAL HIGH (ref 70–99)

## 2018-09-02 LAB — CBC
HEMATOCRIT: 30.2 % — AB (ref 36.0–46.0)
Hemoglobin: 9 g/dL — ABNORMAL LOW (ref 12.0–15.0)
MCH: 24.4 pg — ABNORMAL LOW (ref 26.0–34.0)
MCHC: 29.8 g/dL — AB (ref 30.0–36.0)
MCV: 81.8 fL (ref 80.0–100.0)
Platelets: 441 10*3/uL — ABNORMAL HIGH (ref 150–400)
RBC: 3.69 MIL/uL — AB (ref 3.87–5.11)
RDW: 16.7 % — ABNORMAL HIGH (ref 11.5–15.5)
WBC: 12.5 10*3/uL — AB (ref 4.0–10.5)
nRBC: 0.3 % — ABNORMAL HIGH (ref 0.0–0.2)

## 2018-09-02 LAB — BASIC METABOLIC PANEL
ANION GAP: 8 (ref 5–15)
BUN: 39 mg/dL — AB (ref 6–20)
CHLORIDE: 110 mmol/L (ref 98–111)
CO2: 25 mmol/L (ref 22–32)
Calcium: 11.5 mg/dL — ABNORMAL HIGH (ref 8.9–10.3)
Creatinine, Ser: 1.16 mg/dL — ABNORMAL HIGH (ref 0.44–1.00)
GFR calc non Af Amer: 51 mL/min — ABNORMAL LOW (ref >60)
GFR, EST AFRICAN AMERICAN: 59 mL/min — AB (ref >60)
Glucose, Bld: 153 mg/dL — ABNORMAL HIGH (ref 70–99)
POTASSIUM: 4.3 mmol/L (ref 3.5–5.1)
SODIUM: 143 mmol/L (ref 135–145)

## 2018-09-02 LAB — POCT I-STAT 3, ART BLOOD GAS (G3+)
Acid-Base Excess: 2 mmol/L (ref 0.0–2.0)
Bicarbonate: 25.6 mmol/L (ref 20.0–28.0)
O2 Saturation: 99 %
PCO2 ART: 33.1 mmHg (ref 32.0–48.0)
PH ART: 7.496 — AB (ref 7.350–7.450)
Patient temperature: 98.6
TCO2: 27 mmol/L (ref 22–32)
pO2, Arterial: 112 mmHg — ABNORMAL HIGH (ref 83.0–108.0)

## 2018-09-02 NOTE — Progress Notes (Signed)
STROKE TEAM PROGRESS NOTE   SUBJECTIVE (INTERVAL HISTORY) Pt RN is at the bedside. She  Head opened gastrostomy and incision and drainage of the abdominal wall abscess yesterday by Dr. Hulen Skains. She has a open abdominal wound with drain and has tube which has been surgically placed. Tube feeds have been started.her WBC count has come down today. Neurologically she remains unchanged  OBJECTIVE Vitals:   09/02/18 1547 09/02/18 1600 09/02/18 1644 09/02/18 1700  BP:  122/79 128/76 129/71  Pulse:  89 87 90  Resp:  (!) 34  (!) 32  Temp:      TempSrc:      SpO2: 100% 100%  99%  Weight:      Height:        CBC:  Recent Labs  Lab 08/12/2018 0525 09/02/18 0617  WBC 17.3* 12.5*  HGB 8.6* 9.0*  HCT 28.7* 30.2*  MCV 81.8 81.8  PLT 582* 441*    Basic Metabolic Panel:  Recent Labs  Lab 08/29/18 0614  08/10/2018 0525 09/02/18 0617  NA 139   < > 140 143  K 4.2   < > 4.7 4.3  CL 105   < > 107 110  CO2 25   < > 25 25  GLUCOSE 229*   < > 128* 153*  BUN 37*   < > 43* 39*  CREATININE 1.12*   < > 1.11* 1.16*  CALCIUM 11.1*   < > 11.3* 11.5*  MG 2.2  --   --   --   PHOS 2.9  --   --   --    < > = values in this interval not displayed.    Lipid Panel:     Component Value Date/Time   CHOL 256 (H) 08/15/2018 0512   TRIG 248 (H) 08/15/2018 0512   HDL 35 (L) 08/15/2018 0512   CHOLHDL 7.3 08/15/2018 0512   VLDL 50 (H) 08/15/2018 0512   LDLCALC 171 (H) 08/15/2018 0512   HgbA1c:  Lab Results  Component Value Date   HGBA1C 11.4 (H) 08/15/2018   Urine Drug Screen:     Component Value Date/Time   LABOPIA NONE DETECTED 08/20/2018 1836   COCAINSCRNUR NONE DETECTED 08/06/2018 1836   LABBENZ NONE DETECTED 09/02/2018 1836   AMPHETMU NONE DETECTED 08/10/2018 1836   THCU NONE DETECTED 08/29/2018 1836   LABBARB NONE DETECTED 08/21/2018 1836    Alcohol Level No results found for: ETH  IMAGING  Ct Angio Head W Or Wo Contrast Ct Angio Neck W Or Wo Contrast 08/15/2018 IMPRESSION:  1.  Interval basilar to left proximal PCA stenting. The density of the stent walls precludes detection of in stent stenosis; there is a degree of wasting at the mid basilar segment. There is flow in the left PCA beyond the stent such that there is presumed stent patency.  2. Known lower pontine infarct. There are new small cerebellar infarcts since brain MRI yesterday.  3. Severe left P2 segment stenosis, also seen previously.  4. Moderate atheromatous narrowing in the proximal left MCA.    Ct Angio Head W Or Wo Contrast Ct Angio Neck W Or Wo Contrast 08/19/2018 IMPRESSION:  1. 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 No definite acute thrombus identified.  2. Severe stenosis left posterior cerebral artery and moderate stenosis right posterior cerebral artery.  3. Mild stenosis left MCA and moderate stenosis left MCA bifurcation.  4. No significant carotid or vertebral artery  stenosis in the neck.    Ct Head Wo Contrast 08/30/2018 IMPRESSION:  No acute intracranial abnormalities. Mild white matter changes likely due to small vessel ischemia.    Mr Brain Wo Contrast 08/07/2018 IMPRESSION:  1. Acute nonhemorrhagic infarct involving the left paramedian brainstem. The infarct crosses midline.  2. Other periventricular and subcortical white matter disease is moderately advanced for age. This likely reflects the sequela of chronic microvascular ischemia.  3. Tapering of the dens with prominent soft tissue pannus. 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    Ct Head Code Stroke Wo Contrast 08/28/2018  IMPRESSION:  1. No acute abnormality and no change from earlier today  2. ASPECTS is 10 3.    Cerebral Angiogram 08/10/2018 S/P 4 vessel cerebral arteriogram RT CFA approach. Findings  1.Severe stenosis of distal basilar artery 90 % ,associated with mod to severe ASVD of the mid basilar artery and Lt ANt cerebellar  A. S/P stent assisted angioplasty of distal basilar artery with patency of 80 to 90 %. 2.Approx 70 % stenosis of LT ICA supraclinoid seg   MRI 08/15/18 1. Progressive acute pontine infarct. New patchy bilateral cerebellar infarction. New tiny left thalamic infarcts. 2. Preserved flow void in the stented basilar. 3. left facial/submandibular edema, please correlate with neck exam. Edited result: IMPRESSION: 1. Progressive acute pontine infarct. New patchy bilateral cerebellar infarction. New tiny left thalamic infarcts. 2. Preserved flow void in the stented basilar.  TTE - Left ventricle: The cavity size was normal. There was severe   concentric hypertrophy. Systolic function was normal. The   estimated ejection fraction was in the range of 60% to 65%. Wall   motion was normal; there were no regional wall motion   abnormalities. Doppler parameters are consistent with abnormal   left ventricular relaxation (grade 1 diastolic dysfunction). The   E/e&' ratio is <8, suggesting normal LV filling pressure. - Aortic valve: Trileaflet; mildly calcified leaflets.   Transvalvular velocity was minimally increased. There was no   regurgitation. Mean gradient (S): 12 mm Hg. - Mitral valve: Mildly thickened leaflets . There was trivial   regurgitation. - Left atrium: The atrium was normal in size. - Inferior vena cava: The vessel was dilated. The respirophasic   diameter changes were blunted (< 50%), consistent with elevated   central venous pressure. Impressions: - LVEF 60-65%, severe LVH, normal wall motion, grade 1 DD, normal   LV filling pressure, minimally increased aortic velocity without   signficant stenosis, trivial MR, normal LA size, dilated IVC.  Dg Chest Port 1 View  Result Date: 08/23/2018 CLINICAL DATA:  Encounter for fever. EXAM: PORTABLE CHEST 1 VIEW COMPARISON:  08/19/2018 FINDINGS: Endotracheal tube is 3.6 cm above the carina. Airspace densities in the right hilum and  right lower lung have markedly decreased and possibly resolved. There may be a small amount of residual disease in the medial right lower lung. Vascular crowding in the hilar regions. Low lung volumes. Heart size is grossly stable and within normal limits. Nasogastric tube extends into the abdomen. Negative for pneumothorax. IMPRESSION: Airspace disease in the right lung has markedly decreased. Low lung volumes. Support apparatuses as described. Electronically Signed   By: Markus Daft M.D.   On: 08/23/2018 11:07    DG Chest 1 View 08/30/2018 IMPRESSION: Cardiomegaly and mild pulmonary venous congestion/edema. No other change.   PHYSICAL EXAM  Temp:  [98.1 F (36.7 C)-101.6 F (38.7 C)] 101.6 F (38.7 C) (10/29 1200) Pulse Rate:  [81-96] 90 (10/29 1700) Resp:  [  20-36] 32 (10/29 1700) BP: (122-180)/(66-98) 129/71 (10/29 1700) SpO2:  [92 %-100 %] 99 % (10/29 1700) FiO2 (%):  [30 %-60 %] 30 % (10/29 1547) Weight:  [128.1 kg] 128.1 kg (10/29 0500)  General -  Obese middle-aged African-American lady, in distress. She has abdominal surgical wound and drainage from the abdominal wall Ophthalmologic - fundi not visualized due to noncooperation.  Cardiovascular - not tachycardic currently in the 80s RRR  Neuro - post trach, on trach collar, off sedation. Eyes open, awake, alert. No ptosis or EOMI, denies diplopia.  PERRL. blinking to visual threat bilaterally. Right gaze nystagmus   improved, can follow and track examiner across the room. Bilateral facial weakness, R>L, corneal, gag and cough present. Able to open mouth slightly but not able to protrude tongue. Quadriplegia, with left LE intermittent  flicker, proximal and 0/5 distal, grimaces to pain in the right UE. DTR diminished throughout. Coordination and gait not tested.   ASSESSMENT/PLAN Andrea [name] is a 57 y.o. female with history of difficult to control hypertension, insulin-dependent diabetes, obesity, hypothyroidism status  post ablation presenting with chest discomfort, right-sided weakness, slurred speech and right facial droop. She did not receive IV t-PA due to late presentation. S/P stent assisted angioplasty of distal basilar artery.  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 Worsening symptoms with extension of pontine/medullary infarcts with new small bilateral cerebellar infarcts without evidence of stent re-stenosis or occlusion  Resultant quadriplegia, on trach and peg  CT head - No acute intracranial abnormalities.  CTA H&N 08/20/2018 - Extensive atherosclerotic disease in the basilar with focal critical stenosis in the mid to distal basilar. No definite acute thrombus identified. Severe stenosis left posterior cerebral artery  MRI head - Acute nonhemorrhagic infarct involving the left paramedian brainstem.   CTA H&N 08/15/2018 - stent too dense to see BA lumen, but distal flow preserved. New small cerebellar infarcts since brain MRI yesterday.  Severe left P2 segment stenosis, also seen previously.   Repeat MRI - extension of b/l pontine and upper medullary infarcts with new b/l cerebellar infarcts.  2D Echo - EF 60-65%  LDL - 174  HgbA1c - 11.1  VTE prophylaxis - heparin subq  aspirin 81 mg daily prior to admission, now resumed on Brilinta 90 mg bid and ASA 81 mg daily.   Patient counseled to be compliant with her antithrombotic medications  Ongoing aggressive stroke risk factor management  Therapy recommendations:  LTACH  Disposition:  Pending  Intracranial stenosis  BA mid to distal severe stenosis s/p BA stenting  Left PCA severe stenosis, R PCA moderate stenosis  Left MCA moderate stenosis  Uncontrolled stroke risk factors with HLD, HTN, DM  Non compliance with meds at home  Respiratory failure  S/p trach 08/05/2018  On trach collar now, tolerating well  Still has secretions, improved  CCM following with trach  intermittently  Hypertension  Stable  BP goal 130-150   On po norvasc, losartan  Metoprolol changed to Coreg 25 bid, also on HCTZ 56m now  Also on low dose hydralazine 253mtid  Hyperlipidemia  Lipid lowering medication PTA:  Lipitor 20 mg daily  LDL 174, goal < 70  Increase to 80 mg daily  Continue statin at discharge  Diabetes  HgbA1c 11.4, goal < 7.0  Uncontrolled  Hyperglycemia  Lantus increase from 15U bid to 18U bid   Continue basal NovoLog increase from 4U Q4h to 6U Q4h  SSI  CBG monitoring - glucose 272 -> Diabetic nurse consult  Infection no doubt contributing to elevated glucose.  Low grade fever with leukocytosis  Tmax 100.7 -> afebrile->100.7->101-> afebrile   Leukocytosis - 12.7->12.9->14.4->14.3->16.1->20.6->19.4->19.0 -> 20.8->19.7  UA neg for UTI  CXR unremarkable, improving from prior  Sputum culture normal flora  On vanco and fortaz for empiric treatment     U/A abnormal - culture pending ; Blood cultures negative 10/26 ; CXR  Not C/W pneumonia  Respiratory cultures - mixed flora    Abdominal wall abscess -s/p open surgical drainage 08/15/2018    Hypernatremia and elevated Cre/BUN  Na 156->156->151->148->146->141->137->139  Cre 1.41->1.40->1.22->1.16->1.22->1.18->1.06->1.12-> 1.19-> 1.27  Put on NS @ 50  On TF @ 45  Add free water 249m Q8h  Continue BMP monitoring  Dysphagia s/p PEG  Continue tube feeding 45 cc/h  PEG done 08/08/2018  Anemia   Hb 11.1->9.7->9.4->9.3->9.4->8.8->8.2->7.9->7.7->7.0 -> 8.8-> 8.7  Likely due to iron deficiency and acute uterine bleeding  Iron panel showed iron deficiency  Put on iron pills  PRBC transfusion 2 U Friday 08/29/2018  CBC monitoring  Abnormal uterine bleeding  Has been following with GYN  Was on megace in the past  Will resume megace 860mbid  Continue ASA and brilinta for now  Vaginal bleeding much improved as per RN  Close CBC monitoring  Other  Stroke Risk Factors  Obesity, Body mass index is 48.48 kg/m., recommend weight loss, diet and exercise as appropriate   Other Active Problems  Hypokalemia - on supplement for K 3.3->4.2 -> 4.5-> 3.8  Plts - 489 -> 553-> [id number]   Continue antibiotics. Follow-up of medical issues as per critical care team. Patient's prognosis is quite poor and given her significant multiple medical problems chances of her surviving and making meaningful clinical recovery are quite bleak. I had a family meeting today with multiple to discuss goals of care and recommend palliative care consult. Discussed with Dr. IcValeta Harmsritical care medicine who was also present for the meeting an concurred with my assessment. This patient is critically ill due to worsening brainstem infarct, BA stenosis s/p stenting, hyperglycemia, currently febrile and tachypnic requiring frequent suctioning, elevated WBCs,  +leukocutes in urine, hypertensive,  anemia and hypernatremia and at significant risk of neurological worsening, death form recurrent stroke, hemorrhagic conversion, CHF, seizure, aspiration, hemorrhagic shock.   This patient is critically ill and at significant risk of neurological worsening, death and care requires constant monitoring of vital signs, hemodynamics,respiratory and cardiac monitoring,review of multiple databases, neurological assessment, discussion with family, other specialists and medical decision making of high complexity.I  I spent 55 minutes of neurocritical care time in the care of this patient.  PrAntony ContrasD  ADDENDUM : I was called this afternoon by the nurse saying patient's niece and one more family member at the bedside who were not present in the meeting this morning and requested to speak to me. I met with them and repeated my assessment of her poor prognosis and chances of survival and making meaningful recovery to live independently being negligible. The patient's niece  appeared to be upset with the care that she was getting and [facility] that this would be quite difficult but she requested the social worker try to arrange this. I'll introduce her to the neuro ICU to charge nurse to discuss any noticing related issues but she said no she had no concerns about the  nursing care but just wanted the patient to be transferred to [facility]. We informed her that we would try.   Antony Contras, MD   To contact Stroke Continuity provider, please refer to [email or web address] After hours, contact General Neurology

## 2018-09-02 NOTE — Progress Notes (Signed)
FPTS Interim Progress Note  Patient had PEG replaced and antibiotics continued. Family meeting this morning.   Appreciate the exception care provided by critical care, neurology, and general surgery for Andrea [name]. FPTS will continue to follow. Will be happy to accept care once clinical status improves.  Danna Hefty, DO 09/02/2018, 10:00 AM PGY-1, [facility] pager [phone]

## 2018-09-02 NOTE — Plan of Care (Signed)
Family meeting occurred between MD and family this morning in regards to plan of care. Family, pt's niece, requesting for patient to be transferred to another facility. MD Leonie Man and director Laurence Slate are aware of pt's family request. Social work was contacted. Resumed tube feedings.

## 2018-09-02 NOTE — Anesthesia Postprocedure Evaluation (Signed)
Anesthesia Post Note  Patient: CODA FILLER  Procedure(s) Performed: EXPLORATORY LAPAROTOMY (N/A Abdomen) INSERTION OF GASTROSTOMY TUBE (N/A Abdomen)     Patient location during evaluation: PACU Anesthesia Type: General Level of consciousness: awake and alert Pain management: pain level controlled Vital Signs Assessment: post-procedure vital signs reviewed and stable Respiratory status: spontaneous breathing, nonlabored ventilation, respiratory function stable and patient connected to nasal cannula oxygen Cardiovascular status: blood pressure returned to baseline and stable Postop Assessment: no apparent nausea or vomiting Anesthetic complications: no    Last Vitals:  Vitals:   09/02/18 0730 09/02/18 0801  BP: (!) 150/83 (!) 153/87  Pulse: 96 96  Resp: (!) 36   Temp:    SpO2: 99%     Last Pain:  Vitals:   09/02/18 0400  TempSrc: Oral  PainSc:                  Karinda Cabriales DAVID

## 2018-09-02 NOTE — Progress Notes (Signed)
Nutrition Follow-up  DOCUMENTATION CODES:   Morbid obesity  INTERVENTION:   Continue:  Glucerna 1.2 @ 45 ml/hr (1080 ml/ay) via g-tube 60 ml Prostat BID  Provides: 1696 kcal, 124 grams protein, and 875 ml free water.  Total free water: 1475 ml  Total carbohydrate: 163 grams  NUTRITION DIAGNOSIS:   Inadequate oral intake related to inability to eat as evidenced by NPO status. Ongoing  GOAL:   Patient will meet greater than or equal to 90% of their needs Met.   MONITOR:   Vent status, TF tolerance, I & O's, Labs  ASSESSMENT:   Andrea [name] is a 56 yo female with PMH of type 2 diabetes, HTN, CKD III, obesity, and anemia admitted for chest pain. Code Stroke called 10/10 in AM. 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 Intubated 10/10 in ICU.   Pt discussed during ICU rounds and with RN.  Pt on trach collar since 10/22, cuff deflated this am with SLP.  10/28 pt had open g-tube and I&D abd wall abscess due to malpositioned PEG, pt remains on vent post-op +fevers Palliative care consult pending  Weight up with positive fluid balance  Medications reviewed and include: colace, ferrous sulfate TID, SSI, 8 units novolog every 4 hours, lantus 22 units BID, MVI 200 ml free water every 8 hours = 600 ml NS @ 50 ml/hr Labs reviewed:  CBG (last 3)  Recent Labs    09/02/18 0359 09/02/18 0900 09/02/18 1223  GLUCAP 131* 155* 198*     Diet Order:   Diet Order            Diet NPO time specified  Diet effective ____              EDUCATION NEEDS:   Not appropriate for education at this time  Skin:  Skin Assessment: Skin Integrity Issues: Skin Integrity Issues:: Incisions Stage II: vulva due to pure wick Incisions: abd  Last BM:  10/25 small  Height:   Ht Readings from Last 1 Encounters:  08/15/18 5' 4" (1.626 m)    Weight:   Wt Readings from Last 1 Encounters:  09/02/18 128.1 kg    Ideal Body Weight:  54.5 kg  BMI:  Body mass index is 48.48  kg/m.  Estimated Nutritional Needs:   Kcal:  1500-1750  Protein:  120-130 grams  Fluid:  >1.5 L/day  Heather Pitts RD, LDN, CNSC [phone] Pager [phone] After Hours Pager\  

## 2018-09-02 NOTE — Progress Notes (Signed)
PT Cancellation Note  Patient Details Name: Andrea [name] MRN: [id number] DOB: October 04, 1961   Cancelled Treatment:    Reason Eval/Treat Not Completed: Patient not medically ready. Pt now back on vent and with open abdominal wound. Family meeting with medical team and palliative team. PT to await further medical plan and clearance from family to continue PT.  Kittie Plater, PT, DPT Acute Rehabilitation Services Pager #: [phone] Office #: [phone]    Berline Lopes 09/02/2018, 1:52 PM

## 2018-09-02 NOTE — Progress Notes (Signed)
[place] Progress Note Patient Name: APRIL COLTER DOB: [birth year] MRN: [id number]   Date of Service  09/02/2018  HPI/Events of Note  Went to OR for leaking PEG tube. Returns from OR on mechanical ventilation via chronic tracheostomy. PCCM already following for tracheostomy.   eICU Interventions  Will order: 1. Ventilator Orders: 40%/PRVC 15/TV 420/P 5.  2. ABG STAT.     Intervention Category Major Interventions: Respiratory failure - evaluation and management  Sommer,Steven Eugene 09/02/2018, 1:25 AM

## 2018-09-02 NOTE — Progress Notes (Signed)
NAME:  Andrea [name], MRN:  [id number], DOB:  Sep 26, 1961, LOS: 39 ADMISSION DATE:  08/26/2018, CONSULTATION DATE:  08/06/2018 REFERRING MD:  Dr. Rory Percy, CHIEF COMPLAINT:  CP/ Acute CVA  Brief History   89 yoF w/poorly controlled HTN and IDDM presenting with CP and left arm pain w/BP 220/117. Initial CTH neg.  Cardiac workup negative for acute event thus far, cardiology following.  Code stroke initiated for R hemiplegia, dysarthria and right facial droop.  Out of window for TPA.  Requiring cleviprex for BP control.  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  Evolving pontine stroke on MRI. Intubated 10/10 for neuro IR s/p stenting and angioplasty with 80-90% patency.  Extubated post procedure but due to insufficency placed on BiPAP and brought to ICU.  Required reintubation and ultimately trach (10/21 JY).   Past Medical History  HTN, IDDM, hypothyroidism s/p ablation  Significant Hospital Events   10/9 present to [facility] ED -transferred to [facility] 10/9 basilar artery stenting 10/9 intubated urgently 6h post procedure for inability to protect airway. 10/10 CTA for declining exam - evolving pontine infarct, stent deemed to be patent 10/14 Brillinta stopped and ASA/ heparin started to in anticipation of tracheostomy on Monday. 10/14 through 10/19: Neuro exam has been stable sodium increasing in spite of free water via tube, providing supportive care awaiting trach 10/20: Sodium down to 151 from 156 clinically looks the same 10/21: Sodium down to 148, sputum culture sent for purulent tracheal secretions, awaiting trach and PEG.  Purulent bronchial secretions from right upper lobe sent for BAL 10/22: Tracheostomy and PEG completed on the 21st tolerated well.  Spiking low-grade fever white cells continued to climb starting empiric vancomycin and ceftaz.  Sodium improved down to 146.  Blood pressure improving but still not at goal added hydrochlorothiazide.  Resumed Brilinta.  10/23:  Sodium normal.  Backing down on free water.  Glycemic control challenging, increased basal NovoLog in addition to Lantus and sliding scale.  Still spiking fever, white blood cell count climbing, sending blood cultures.  Foley catheter being placed for pressure ulcer caused by pure wick device 10/27 Foul smelling dark trach secretions, PEG secretions  10/28 to OR for ex lap due to PEG dislodgement.  PEG replaced  Consults: date of consult/date signed off & final recs:  Cards 10/10 Neurology 10/10 PCCM 10/10  Procedures (surgical and bedside):  10/10 Neuro IR >> s/p stent and angioplasty of distal basilar artery  10/10 ETT for procedure 10/10 ETT (reintubated) >> removed 10/21: Size 6 tracheostomy placed by Dr. Nelda Marseille 10/26 changed to #4 trach cuffless  10/28 Ex lap with replacement of gastrostomy tube due to dislodgement  Significant Diagnostic Tests:  10/10  CTH >>  No acute intracranial abnormalities. 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 No definite acute thrombus identified. Severe stenosis left posterior cerebral artery and moderate stenosis right posterior cerebral artery. Mild stenosis left MCA and moderate stenosis left MCA bifurcation. No significant carotid or vertebral artery stenosis in the neck. 10/10 MRI >> evolving pontine infarct. 10/11 ECHO >> normal LVSF with severe LVH. 10/28 CT A / P > dislodged PEG tube outside of the peritoneum and not within the stomach.  Cholelithiasis. No bowel obstruction.  No hydronephrosis.  Micro Data:  10/10 MRSA PCR >> negative 10/21 Sputum culture >> nl resp flora  10/23 Blood cultures x 2 >> 10/27 Sputum culture >>  Antimicrobials:  10/10 cefazolin preop 10/22 Vancomycin >> 10/22 Ceftaz >>  Subjective:  Had ex  lap with gastrostomy tube replaced 10/28.  Bleeding at surgery site overnight requiring multiple dressing changes. Family meeting with Dr. Leonie Man this AM for goals of care discussions.  This AM, pt complains of mild abd pain.  Has tachypnea with RR in high 20's to low 30's; therefore, remain on full vent support with no weaning trials.  Objective   Blood pressure (!) 153/87, pulse 96, temperature 99.4 F (37.4 C), temperature source Oral, resp. rate (!) 36, height _0  (1.626 m), weight 128.1 kg, last menstrual period 07/26/2018, SpO2 99 %.    Vent Mode: PRVC FiO2 (%):  [30 %-60 %] 30 % Set Rate:  [15 bmp] 15 bmp Vt Set:  [420 mL] 420 mL PEEP:  [5 cmH20] 5 cmH20 Plateau Pressure:  [15 cmH20-22 cmH20] 15 cmH20   Intake/Output Summary (Last 24 hours) at 09/02/2018 0817 Last data filed at 09/02/2018 6283 Gross per 24 hour  Intake 1892.37 ml  Output 2525 ml  Net -632.63 ml   Filed Weights   08/31/18 0500 08/09/2018 0415 09/02/18 0500  Weight: 128.4 kg 128.6 kg 128.1 kg    Examination:  General: chronically ill appearing female, in NAD.  HEENT: mm moist, trach site C/D/I.  Moderate secretions. Neuro: opens eyes to voice, tracks briefly, does not follow commands. CV: RRR, no M/R/G. PULM: Respirations intermittently shallow and rapid.  Clear bilaterally. GI: soft, non tender, abd dressings C/D/I.  PEG C/D/I. Extremities: warm and dry, no sig edema. Skin: no rashes or lesions.   Assessment & Plan:   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   P: Plan per Neurology/Primary, family meeting planned for today at [place] Will need SNF placement at some point Continue ASA, Brilinta per primary  Respiratory failure/ hypoxemia due to inability to protect airway from cva -Expected airway difficulties with location of stroke.  P: Continue on full vent  support this AM, defer weaning trial due to tachypnea Continue bronchial hygiene PCCM will continue to follow for trach care Follow CXR  Increased tracheal secretions, nasal discharge, and leukocytosis -Worrisome for sinusitis and for tracheobronchitis: BAL sent from right upper lobe on 10/21 was normal. Repeat sputum culture sent 10/27 and pending.  WBC improved 10/29. P: Continue ceftaz, vanco Follow cultures - sputum culture with GPC, GPR, rare GNR>>>  Hypertensive Crisis -improved.  Cleviprex off. P: Continue coreg, hydralazine, norvasc, hctz per primary   Hypernatremia - resolved after free water P: Continue Free water 200 ml Q8   AKI P: Follow BMP  Dislodged PEG tube - s/p ex lap with replacement of PEG 10/28 P: Post op care per surgery   Disposition / Summary of Today's Plan 09/02/18   Family meeting planned for today 10/29 with Dr. Leonie Man for goals of care discussions.  PCCM will continue to follow 2-3x / week for trach care.  Best Practice  Diet: TFs Pain/Anxiety/Delirium protocol (if indicated): RASS goal 0  VAP protocol (if indicated): Ordered DVT prophylaxis: SCDs  GI prophylaxis: Famotidine Hyperglycemia protocol: SSI, lantus Mobility: Bedrest Code Status: Full  Family: none available am 10/27 or 10/29.  Family meeting planned 9am 10/29 with Dr. Leonie Man.  CC time: 30 min.   Montey Hora, [place] - [facility] [place] [facility] Pager: [phone]  or [phone] 09/02/2018, 8:33 AM

## 2018-09-02 NOTE — Progress Notes (Signed)
OT Cancellation Note  Patient Details Name: Andrea [name] MRN: [id number] DOB: 1961/02/01   Cancelled Treatment:    Reason Eval/Treat Not Completed: Other (comment). Family meeting at this time to determine POC. Pt currently on vent s/p surgery 10/28. Febrile. Will follow up tomorrow.   Ramond Dial, OT/L   Acute OT Clinical Specialist Acute Rehabilitation Services Pager [phone] Office [phone]  09/02/2018, 2:06 PM

## 2018-09-02 NOTE — Progress Notes (Signed)
SLP Cancellation Note  Patient Details Name: NICOLLETTE WILHELMI MRN: [id number] DOB: [birth year]   Cancelled treatment:       Reason Eval/Treat Not Completed: Medical issues which prohibited therapy(back on vent after procedure yesterday)   Chasty Randal, [name] 09/02/2018, 9:25 AM

## 2018-09-02 NOTE — Progress Notes (Signed)
CPT not performed at this time. Trauma MD at bedside assessing wound at this time.

## 2018-09-02 NOTE — Progress Notes (Signed)
Patient ID: Andrea [name], female   DOB: [birth year], 57 y.o.   MRN: [id number] Midline dressing changed - wet to dry. Had a little bleeding L side - stopped with pressure. Penrose in place Still some purulent drainage around PEG, PEG working  S/P open gastrostomy and I&D abdominal wall abscess 10/28 by Dr. Hulen Skains Continue wound care [facility] Stroke Team had a family meeting this morning and they are consulting Garland, MD, MPH, [place] Trauma: [phone] General Surgery: [phone]

## 2018-09-03 ENCOUNTER — Inpatient Hospital Stay (HOSPITAL_COMMUNITY): Payer: Medicaid Other

## 2018-09-03 DIAGNOSIS — L02211 Cutaneous abscess of abdominal wall: Secondary | ICD-10-CM

## 2018-09-03 LAB — GLUCOSE, CAPILLARY
GLUCOSE-CAPILLARY: 125 mg/dL — AB (ref 70–99)
Glucose-Capillary: 101 mg/dL — ABNORMAL HIGH (ref 70–99)
Glucose-Capillary: 103 mg/dL — ABNORMAL HIGH (ref 70–99)
Glucose-Capillary: 122 mg/dL — ABNORMAL HIGH (ref 70–99)
Glucose-Capillary: 126 mg/dL — ABNORMAL HIGH (ref 70–99)
Glucose-Capillary: 130 mg/dL — ABNORMAL HIGH (ref 70–99)
Glucose-Capillary: 172 mg/dL — ABNORMAL HIGH (ref 70–99)

## 2018-09-03 LAB — BASIC METABOLIC PANEL WITH GFR
Anion gap: 9 (ref 5–15)
BUN: 57 mg/dL — ABNORMAL HIGH (ref 6–20)
CO2: 23 mmol/L (ref 22–32)
Calcium: 11.5 mg/dL — ABNORMAL HIGH (ref 8.9–10.3)
Chloride: 114 mmol/L — ABNORMAL HIGH (ref 98–111)
Creatinine, Ser: 1.54 mg/dL — ABNORMAL HIGH (ref 0.44–1.00)
GFR calc Af Amer: 42 mL/min — ABNORMAL LOW (ref >60)
GFR calc non Af Amer: 36 mL/min — ABNORMAL LOW (ref >60)
Glucose, Bld: 148 mg/dL — ABNORMAL HIGH (ref 70–99)
Potassium: 3.6 mmol/L (ref 3.5–5.1)
Sodium: 146 mmol/L — ABNORMAL HIGH (ref 135–145)

## 2018-09-03 LAB — CBC
HCT: 24.8 % — ABNORMAL LOW (ref 36.0–46.0)
HEMOGLOBIN: 7.1 g/dL — AB (ref 12.0–15.0)
MCH: 24.3 pg — ABNORMAL LOW (ref 26.0–34.0)
MCHC: 28.6 g/dL — AB (ref 30.0–36.0)
MCV: 84.9 fL (ref 80.0–100.0)
NRBC: 0.4 % — AB (ref 0.0–0.2)
Platelets: 475 10*3/uL — ABNORMAL HIGH (ref 150–400)
RBC: 2.92 MIL/uL — ABNORMAL LOW (ref 3.87–5.11)
RDW: 16.5 % — AB (ref 11.5–15.5)
WBC: 15.3 10*3/uL — AB (ref 4.0–10.5)

## 2018-09-03 LAB — CULTURE, RESPIRATORY W GRAM STAIN: Culture: NORMAL

## 2018-09-03 LAB — CULTURE, RESPIRATORY

## 2018-09-03 MED ORDER — IOHEXOL 300 MG/ML  SOLN
100.0000 mL | Freq: Once | INTRAMUSCULAR | Status: AC | PRN
  Administered 2018-09-03: 100 mL via INTRAVENOUS

## 2018-09-03 MED ORDER — HYDRALAZINE HCL 25 MG PO TABS
25.0000 mg | ORAL_TABLET | Freq: Three times a day (TID) | ORAL | Status: DC
  Administered 2018-09-03 – 2018-09-21 (×51): 25 mg
  Filled 2018-09-03 (×53): qty 1

## 2018-09-03 MED ORDER — MEGESTROL ACETATE 40 MG PO TABS
80.0000 mg | ORAL_TABLET | Freq: Two times a day (BID) | ORAL | Status: DC
  Administered 2018-09-03 – 2018-09-05 (×5): 80 mg
  Filled 2018-09-03 (×6): qty 2

## 2018-09-03 NOTE — Progress Notes (Addendum)
STROKE TEAM PROGRESS NOTE   SUBJECTIVE (INTERVAL HISTORY) Pt RN is at the bedside. She  remains neurologically unchanged.she has developed fever and elevated white count. She continues to have significant drainage from her abdominal wall abscess drainage site.Upon being requested by the family I called the transfer center at [facility] and spoke to Dr. Kevin Fenton the neuro critical care physician and discussed the case and she felt that patient was not appropriate for transfer to [facility] as she felt they will would not offer any different or higher level of care at the present time. OBJECTIVE Vitals:   09/03/18 1200 09/03/18 1300 09/03/18 1400 09/03/18 1538  BP: 138/68 129/65 125/66   Pulse: 88 84 85   Resp: (!) 36 (!) 35 (!) 35   Temp:      TempSrc:      SpO2: 98% 99% 99% 100%  Weight:      Height:        CBC:  Recent Labs  Lab 09/02/18 0617 09/03/18 0421  WBC 12.5* 15.3*  HGB 9.0* 7.1*  HCT 30.2* 24.8*  MCV 81.8 84.9  PLT 441* 475*    Basic Metabolic Panel:  Recent Labs  Lab 08/29/18 0614  09/02/18 0617 09/03/18 0421  NA 139   < > 143 146*  K 4.2   < > 4.3 3.6  CL 105   < > 110 114*  CO2 25   < > 25 23  GLUCOSE 229*   < > 153* 148*  BUN 37*   < > 39* 57*  CREATININE 1.12*   < > 1.16* 1.54*  CALCIUM 11.1*   < > 11.5* 11.5*  MG 2.2  --   --   --   PHOS 2.9  --   --   --    < > = values in this interval not displayed.    Lipid Panel:     Component Value Date/Time   CHOL 256 (H) 08/15/2018 0512   TRIG 248 (H) 08/15/2018 0512   HDL 35 (L) 08/15/2018 0512   CHOLHDL 7.3 08/15/2018 0512   VLDL 50 (H) 08/15/2018 0512   LDLCALC 171 (H) 08/15/2018 0512   HgbA1c:  Lab Results  Component Value Date   HGBA1C 11.4 (H) 08/15/2018   Urine Drug Screen:     Component Value Date/Time   LABOPIA NONE DETECTED 09/01/2018 1836   COCAINSCRNUR NONE DETECTED 08/21/2018 1836   LABBENZ NONE DETECTED 08/21/2018 1836   AMPHETMU NONE  DETECTED 08/28/2018 1836   THCU NONE DETECTED 08/13/2018 1836   LABBARB NONE DETECTED 08/17/2018 1836    Alcohol Level No results found for: ETH  IMAGING  Ct Angio Head W Or Wo Contrast Ct Angio Neck W Or Wo Contrast 08/15/2018 IMPRESSION:  1. Interval basilar to left proximal PCA stenting. The density of the stent walls precludes detection of in stent stenosis; there is a degree of wasting at the mid basilar segment. There is flow in the left PCA beyond the stent such that there is presumed stent patency.  2. Known lower pontine infarct. There are new small cerebellar infarcts since brain MRI yesterday.  3. Severe left P2 segment stenosis, also seen previously.  4. Moderate atheromatous narrowing in the proximal left MCA.    Ct Angio Head W Or Wo Contrast Ct Angio Neck W Or Wo Contrast 09/03/2018 IMPRESSION:  1. 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  No definite acute thrombus identified.  2. Severe stenosis left posterior cerebral artery and moderate stenosis right posterior cerebral artery.  3. Mild stenosis left MCA and moderate stenosis left MCA bifurcation.  4. No significant carotid or vertebral artery stenosis in the neck.    Ct Head Wo Contrast 08/30/2018 IMPRESSION:  No acute intracranial abnormalities. Mild white matter changes likely due to small vessel ischemia.    Mr Brain Wo Contrast 09/02/2018 IMPRESSION:  1. Acute nonhemorrhagic infarct involving the left paramedian brainstem. The infarct crosses midline.  2. Other periventricular and subcortical white matter disease is moderately advanced for age. This likely reflects the sequela of chronic microvascular ischemia.  3. Tapering of the dens with prominent soft tissue pannus. 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    Ct Head Code Stroke Wo Contrast 08/27/2018  IMPRESSION:  1. No acute abnormality and no change from earlier today  2.  ASPECTS is 10 3.    Cerebral Angiogram 08/13/2018 S/P 4 vessel cerebral arteriogram RT CFA approach. 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 S/P stent assisted angioplasty of distal basilar artery with patency of 80 to 90 %. 2.Approx 70 % stenosis of LT ICA supraclinoid seg   MRI 08/15/18 1. Progressive acute pontine infarct. New patchy bilateral cerebellar infarction. New tiny left thalamic infarcts. 2. Preserved flow void in the stented basilar. 3. left facial/submandibular edema, please correlate with neck exam. Edited result: IMPRESSION: 1. Progressive acute pontine infarct. New patchy bilateral cerebellar infarction. New tiny left thalamic infarcts. 2. Preserved flow void in the stented basilar.  TTE - Left ventricle: The cavity size was normal. There was severe   concentric hypertrophy. Systolic function was normal. The   estimated ejection fraction was in the range of 60% to 65%. Wall   motion was normal; there were no regional wall motion   abnormalities. Doppler parameters are consistent with abnormal   left ventricular relaxation (grade 1 diastolic dysfunction). The   E/e&' ratio is <8, suggesting normal LV filling pressure. - Aortic valve: Trileaflet; mildly calcified leaflets.   Transvalvular velocity was minimally increased. There was no   regurgitation. Mean gradient (S): 12 mm Hg. - Mitral valve: Mildly thickened leaflets . There was trivial   regurgitation. - Left atrium: The atrium was normal in size. - Inferior vena cava: The vessel was dilated. The respirophasic   diameter changes were blunted (< 50%), consistent with elevated   central venous pressure. Impressions: - LVEF 60-65%, severe LVH, normal wall motion, grade 1 DD, normal   LV filling pressure, minimally increased aortic velocity without   signficant stenosis, trivial MR, normal LA size,  dilated IVC.  Dg Chest Port 1 View  Result Date: 08/23/2018 CLINICAL DATA:  Encounter for fever. EXAM: PORTABLE CHEST 1 VIEW COMPARISON:  08/19/2018 FINDINGS: Endotracheal tube is 3.6 cm above the carina. Airspace densities in the right hilum and right lower lung have markedly decreased and possibly resolved. There may be a small amount of residual disease in the medial right lower lung. Vascular crowding in the hilar regions. Low lung volumes. Heart size is grossly stable and within normal limits. Nasogastric tube extends into the abdomen. Negative for pneumothorax. IMPRESSION: Airspace disease in the right lung has markedly decreased. Low lung volumes. Support apparatuses as described. Electronically Signed   By: Markus Daft M.D.   On: 08/23/2018 11:07    DG Chest 1  View 08/30/2018 IMPRESSION: Cardiomegaly and mild pulmonary venous congestion/edema. No other change.   PHYSICAL EXAM  Temp:  [98.7 F (37.1 C)-100.4 F (38 C)] 100.1 F (37.8 C) (10/30 0400) Pulse Rate:  [78-100] 85 (10/30 1400) Resp:  [29-38] 35 (10/30 1400) BP: (116-143)/(55-76) 125/66 (10/30 1400) SpO2:  [96 %-100 %] 100 % (10/30 1538) FiO2 (%):  [30 %] 30 % (10/30 1538) Weight:  [128.3 kg] 128.3 kg (10/30 0607)  General -  Obese middle-aged African-American lady, in distress. She has abdominal surgical wound and drainage from the abdominal wall Ophthalmologic - fundi not visualized due to noncooperation.  Cardiovascular - not tachycardic currently in the 80s RRR  Neuro - post trach, on trach collar, off sedation. Eyes open, awake, alert. No ptosis or EOMI, denies diplopia.  PERRL. blinking to visual threat bilaterally. Right gaze nystagmus   improved, can follow and track examiner across the room. Bilateral facial weakness, R>L, corneal, gag and cough present. Able to open mouth slightly but not able to protrude tongue. Quadriplegia, with left LE intermittent  flicker, proximal and 0/5 distal, grimaces to pain in the  right UE. DTR diminished throughout. Coordination and gait not tested.   ASSESSMENT/PLAN Andrea [name] is a 57 y.o. female with history of difficult to control hypertension, insulin-dependent diabetes, obesity, hypothyroidism status post ablation presenting with chest discomfort, right-sided weakness, slurred speech and right facial droop. She did not receive IV t-PA due to late presentation. S/P stent assisted angioplasty of distal basilar artery.  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 Worsening symptoms with extension of pontine/medullary infarcts with new small bilateral cerebellar infarcts without evidence of stent re-stenosis or occlusion  Resultant quadriplegia, on trach and peg  CT head - No acute intracranial abnormalities.  CTA H&N 08/13/2018 - Extensive atherosclerotic disease in the basilar with focal critical stenosis in the mid to distal basilar. No definite acute thrombus identified. Severe stenosis left posterior cerebral artery  MRI head - Acute nonhemorrhagic infarct involving the left paramedian brainstem.   CTA H&N 08/15/2018 - stent too dense to see BA lumen, but distal flow preserved. New small cerebellar infarcts since brain MRI yesterday.  Severe left P2 segment stenosis, also seen previously.   Repeat MRI - extension of b/l pontine and upper medullary infarcts with new b/l cerebellar infarcts.  2D Echo - EF 60-65%  LDL - 174  HgbA1c - 11.1  VTE prophylaxis - heparin subq  aspirin 81 mg daily prior to admission, now resumed on Brilinta 90 mg bid and ASA 81 mg daily.   Patient counseled to be compliant with her antithrombotic medications  Ongoing aggressive stroke risk factor management  Therapy recommendations:  LTACH  Disposition:  Pending  Intracranial stenosis  BA mid to distal severe stenosis s/p BA stenting  Left PCA severe stenosis, R PCA moderate stenosis  Left MCA moderate  stenosis  Uncontrolled stroke risk factors with HLD, HTN, DM  Non compliance with meds at home  Respiratory failure  S/p trach 08/29/2018  On trach collar now, tolerating well  Still has secretions, improved  CCM following with trach intermittently  Hypertension  Stable  BP goal 130-150   On po norvasc, losartan  Metoprolol changed to Coreg 25 bid, also on HCTZ 25mg  now  Also on low dose hydralazine 25mg  tid  Hyperlipidemia  Lipid lowering medication PTA:  Lipitor 20 mg daily  LDL 174, goal < 70  Increase to 80 mg daily  Continue statin at discharge  Diabetes  HgbA1c 11.4, goal < 7.0  Uncontrolled  Hyperglycemia  Lantus increase from 15U bid to 18U bid   Continue basal NovoLog increase from 4U Q4h to 6U Q4h  SSI  CBG monitoring - glucose 272 -> Diabetic nurse consult  Infection no doubt contributing to elevated glucose.  Low grade fever with leukocytosis  Tmax 100.7 -> afebrile->100.7->101-> afebrile   Leukocytosis - 12.7->12.9->14.4->14.3->16.1->20.6->19.4->19.0 -> 20.8->19.7  UA neg for UTI  CXR unremarkable, improving from prior  Sputum culture normal flora  On vanco and fortaz for empiric treatment     U/A abnormal - culture pending ; Blood cultures negative 10/26 ; CXR  Not C/W pneumonia  Respiratory cultures - mixed flora    Abdominal wall abscess -s/p open surgical drainage 08/23/2018    Hypernatremia and elevated Cre/BUN  Na 156->156->151->148->146->141->137->139  Cre 1.41->1.40->1.22->1.16->1.22->1.18->1.06->1.12-> 1.19-> 1.27  Put on NS @ 50  On TF @ 45  Add free water 200mg  Q8h  Continue BMP monitoring  Dysphagia s/p PEG  Continue tube feeding 45 cc/h  PEG done 08/10/2018  Anemia   Hb 11.1->9.7->9.4->9.3->9.4->8.8->8.2->7.9->7.7->7.0 -> 8.8-> 8.7  Likely due to iron deficiency and acute uterine bleeding  Iron panel showed iron deficiency  Put on iron pills  PRBC transfusion 2 U Friday 08/29/2018  CBC  monitoring  Abnormal uterine bleeding  Has been following with GYN  Was on megace in the past  Will resume megace 80mg  bid  Continue ASA and brilinta for now  Vaginal bleeding much improved as per RN  Close CBC monitoring  Other Stroke Risk Factors  Obesity, Body mass index is 48.55 kg/m., recommend weight loss, diet and exercise as appropriate   Other Active Problems  Hypokalemia - on supplement for K 3.3->4.2 -> 4.5-> 3.8  Plts - 489 -> 553-> [facility] day # 20 Plan   Continue antibiotics. Follow-up of medical issues as per critical care team. Patient's prognosis is quite poor and given her significant multiple medical problems chances of her surviving and making meaningful clinical recovery are quite bleak. I had a family meeting today with multiple to discuss goals of care and recommend palliative care consult. Discussed with Dr. Valeta Harms critical care medicine who was also present for the meeting an concurred with my assessment.Marland KitchenUpon being requested by the family I called the transfer center at [facility] and spoke to Dr. Kevin Fenton the neuro critical care physician and discussed the case and she felt that patient was not appropriate for transfer to [facility] as she felt they will would not offer any different or higher level of care at the present time.I spoke to patient's sister Andrea [name] over the phone and informed her about my conversation with [facility] physician. We do not have the number of the patient's niece to call her and inform her about this  This patient is critically ill due to worsening brainstem infarct, BA stenosis s/p stenting, hyperglycemia, currently febrile and tachypnic requiring frequent suctioning, elevated WBCs,  +leukocutes in urine, hypertensive,  anemia and hypernatremia and at significant risk of neurological worsening, death form recurrent stroke, hemorrhagic conversion, CHF, seizure, aspiration, hemorrhagic  shock.   This patient is critically ill and at significant risk of neurological worsening, death and care requires constant monitoring of vital signs, hemodynamics,respiratory and cardiac monitoring,review of multiple databases, neurological assessment, discussion with family, other specialists and medical decision making of high complexity.I  I spent 45 minutes of neurocritical care  time in the care of this patient.  Antony Contras MD      To contact Stroke Continuity provider, please refer to [email or web address] After hours, contact General Neurology

## 2018-09-03 NOTE — Social Work (Signed)
CSW spoke with CSW assistant director Nathaniel Man- CSW's unable to make hospital to hospital referral. Attending physician must call referral to requested facility and identify an accepting physician. If another facility does accept pt then RN will set up [facility] transfer.  Westley Hummer, MSW, [place] Work [phone]

## 2018-09-03 NOTE — Progress Notes (Signed)
Family meeting occurred with MD this morning in regards to plan of care. Pt's niece, requesting for patient to be transferred to another [facility]). CSW made aware of request and informed  MD Leonie Man, CSW unable to make referral. Referral must be handled by MD. CSW discussed case with RNCM.  Thurmond Butts, [place] Social Worker [phone]

## 2018-09-03 NOTE — Progress Notes (Signed)
Trauma Service Note  Subjective: Patient is diaphoretic, seems very uncomfortable.  Very foul-smelling drainage from around her gastrostoy tube and from the Penrose drain site.  Feculent smelling  Objective: Vital signs in last 24 hours: Temp:  [98.7 F (37.1 C)-101.6 F (38.7 C)] 100.1 F (37.8 C) (10/30 0400) Pulse Rate:  [78-100] 89 (10/30 1100) Resp:  [29-38] 35 (10/30 1100) BP: (116-143)/(55-80) 143/67 (10/30 1100) SpO2:  [96 %-100 %] 98 % (10/30 1100) FiO2 (%):  [30 %] 30 % (10/30 0825) Weight:  [128.3 kg] 128.3 kg (10/30 0607) Last BM Date: 08/29/18  Intake/Output from previous day: 10/29 0701 - 10/30 0700 In: 5196.9 [I.V.:1271.2; NG/GT:1820; IV Piggyback:2105.7] Out: 0865 [Urine:1605] Intake/Output this shift: Total I/O In: 337.8 [I.V.:232.5; IV Piggyback:105.3] Out: 175 [Urine:175]  General: Seems very uncomfortable e and definitely distress when area around her wound is manipulated.  Lungs: Clear  Abd: Feculent drainage around the drain and G-tube.  No stool, but definitely stool smelling.  No air percolating from the wound.    I cut sutures from the G-tube and the Penrose.  SHe is febrile and WBC is increasing.  May need repeat CT scan of her abdomen  Extremities: No changes  Neuro: Locked in  Lab Results: CBC  Recent Labs    09/02/18 0617 09/03/18 0421  WBC 12.5* 15.3*  HGB 9.0* 7.1*  HCT 30.2* 24.8*  PLT 441* 475*   BMET Recent Labs    09/02/18 0617 09/03/18 0421  NA 143 146*  K 4.3 3.6  CL 110 114*  CO2 25 23  GLUCOSE 153* 148*  BUN 39* 57*  CREATININE 1.16* 1.54*  CALCIUM 11.5* 11.5*   PT/INR No results for input(s): LABPROT, INR in the last 72 hours. ABG Recent Labs    08/31/18 1144 09/02/18 0139  PHART 7.476* 7.496*  HCO3 25.4 25.6    Studies/Results: Ct Abd Limited W/cm  Result Date: 08/06/2018 CLINICAL DATA:  Nonfunctioning gastrostomy catheter EXAM: CT ABDOMEN WITH CONTRAST TECHNIQUE: Multidetector CT imaging of the  abdomen was performed using the standard protocol following bolus administration of intravenous contrast. CONTRAST:  150mL OMNIPAQUE IOHEXOL 300 MG/ML  SOLN COMPARISON:  CT abdomen and pelvis January 11, 2017 FINDINGS: Lower chest: There is patchy consolidation in the lung bases bilaterally. There is cardiomegaly. Hepatobiliary: No focal liver lesions are appreciable. There are gallstones within the gallbladder. There is no appreciable gallbladder wall thickening. There is no biliary duct dilatation. Pancreas: No pancreatic lesions are evident. Spleen: No splenic lesions are evident. Adrenals/Urinary Tract: Adrenals bilaterally appear unremarkable. There is a cyst arising from the upper to mid left kidney posteriorly measuring 3.5 x 3.2 cm. There is a cyst arising in the mid to lower left kidney measuring 1.6 x 1.6 cm. There is a cyst more posteriorly in the mid left kidney measuring 2.1 x 1.4 cm. There is a 1 x 1 cm probable cyst more inferiorly in the left kidney. There is a cyst arising from the lateral mid right kidney measuring 1 x 1 cm. There is a 1.4 x 1.4 cm cyst in the medial mid right kidney. There is no evident hydronephrosis on either side. There is no evident renal or proximal ureteral calculus on either side. Stomach/Bowel: There are diverticula in the sigmoid colon without diverticulitis. There is no appreciable bowel wall or mesenteric thickening. No evident bowel obstruction. There is no appreciable free intraperitoneal air. No portal venous air evident. Vascular/Lymphatic: There is no abdominal aortic aneurysm. Visualized vascular structures appear unremarkable.  No adenopathy is evident in the abdomen or upper pelvis. Other: There is a gastrostomy catheter with the tip in the anterior abdominal wall immediately anterior to the rectus muscles. Air is tracking along this catheter as well as air in the anterior rectus muscles midline and slightly toward the left in the upper to mid abdominal regions.  There is soft tissue thickening in the anterior abdominal wall in this region consistent with inflammation. There is no abdominal wall abscess evident. No abscess or ascites is seen within the abdomen or upper pelvis regions. Musculoskeletal: There are foci of degenerative change in the lower thoracic and visualized lumbar regions. No blastic or lytic bone lesions are evident. The muscles appear normal beyond the air and inflammation involving the medial rectus muscles due to the malpositioned gastrostomy catheter as noted. IMPRESSION: 1. The gastrostomy catheter tip is in the posterior aspect of the abdominal wall abutting the anterior rectus muscles. The catheter is outside of the peritoneum. It is not within the stomach. There is associated abdominal wall thickening and air, but no fluid collection or abscess in the abdominal wall. 2.  Cholelithiasis. 3. No bowel obstruction. There is diverticulosis in the transverse colon. No abscess within the abdomen or upper pelvis. 4. No hydronephrosis on either side. No renal or proximal ureteral calculus on either side. These results will be called to the ordering clinician or representative by the Radiologist Assistant, and communication documented in the PACS or zVision Dashboard. Electronically Signed   By: Lowella Grip III M.D.   On: 08/21/2018 13:47    Anti-infectives: Anti-infectives (From admission, onward)   Start     Dose/Rate Route Frequency Ordered Stop   09/02/18 0600  vancomycin (VANCOCIN) 500 mg in sodium chloride 0.9 % 100 mL IVPB  Status:  Discontinued     500 mg 100 mL/hr over 60 Minutes Intravenous Every 12 hours 08/05/2018 1619 09/03/18 1025   08/30/18 1600  vancomycin (VANCOCIN) IVPB 750 mg/150 ml premix  Status:  Discontinued     750 mg 150 mL/hr over 60 Minutes Intravenous Every 12 hours 08/30/18 1334 08/12/2018 1619   08/29/18 2300  vancomycin (VANCOCIN) IVPB 750 mg/150 ml premix  Status:  Discontinued     750 mg 150 mL/hr over 60  Minutes Intravenous Every 12 hours 08/29/18 1223 08/29/18 1327   08/29/18 2300  vancomycin (VANCOCIN) IVPB 1000 mg/200 mL premix  Status:  Discontinued     1,000 mg 200 mL/hr over 60 Minutes Intravenous Every 12 hours 08/29/18 1327 08/30/18 1139   08/29/18 1800  cefTAZidime (FORTAZ) 2 g in sodium chloride 0.9 % 100 mL IVPB     2 g 200 mL/hr over 30 Minutes Intravenous Every 8 hours 08/29/18 1228     08/26/18 2300  vancomycin (VANCOCIN) IVPB 1000 mg/200 mL premix  Status:  Discontinued     1,000 mg 200 mL/hr over 60 Minutes Intravenous Every 12 hours 08/26/18 0923 08/29/18 1223   08/26/18 1000  vancomycin (VANCOCIN) 2,500 mg in sodium chloride 0.9 % 500 mL IVPB     2,500 mg 250 mL/hr over 120 Minutes Intravenous  Once 08/26/18 0923 08/26/18 1900   08/26/18 1000  cefTAZidime (FORTAZ) 1 g in sodium chloride 0.9 % 100 mL IVPB  Status:  Discontinued     1 g 200 mL/hr over 30 Minutes Intravenous Every 8 hours 08/26/18 0923 08/29/18 1228   09/02/2018 1014  ceFAZolin (ANCEF) 2-4 GM/100ML-% IVPB    Note to Pharmacy:  Roma Kayser  :  cabinet override      08/17/2018 1014 08/26/2018 2229      Assessment/Plan: s/p Procedure(s): EXPLORATORY LAPAROTOMY INSERTION OF GASTROSTOMY TUBE CT scan of the abdomen and pelvix with contrast through the PEG  LOS: 20 days   Kathryne Eriksson. Dahlia Bailiff, MD, FACS [phone] Trauma Surgeon 09/03/2018

## 2018-09-03 NOTE — Progress Notes (Signed)
SLP Cancellation Note  Patient Details Name: Andrea [name] MRN: [id number] DOB: 05-Dec-1960   Cancelled treatment:       Reason Eval/Treat Not Completed: Medical issues which prohibited therapy. Pt remains on the ventilator, could consider inline PMSV trials if there is no plan to transition to ATC in the short term.    Tiari Andringa, [name] 09/03/2018, 8:40 AM

## 2018-09-03 NOTE — Progress Notes (Signed)
Notified by nursing staff that family interested in possible transfer to [facility].  Case Manager unable to make referral for hospital to hospital transfer; this must be made by attending physician, who must find accepting physician at that facility.  Case manager can assist once pt accepted at facility and accepting physician has been arranged.    Reinaldo Raddle, RN, BSN  Trauma/Neuro ICU Case Manager [phone]

## 2018-09-03 NOTE — Progress Notes (Signed)
OT Cancellation Note  Patient Details Name: Andrea [name] MRN: [id number] DOB: [birth year]   Cancelled Treatment:    Reason Eval/Treat Not Completed: Other (comment). Pt tachypenic, febrile. Will hold today and follow up tomorrow and progress as appropriate.   Ramond Dial, OT/L   Acute OT Clinical Specialist Acute Rehabilitation Services Pager [phone] Office [phone]  09/03/2018, 4:22 PM

## 2018-09-03 NOTE — Progress Notes (Signed)
NAME:  Andrea [name], MRN:  [id number], DOB:  February 23, 1961, LOS: 83 ADMISSION DATE:  09/04/2018, CONSULTATION DATE:  08/30/2018 REFERRING MD:  Dr. Rory Percy, CHIEF COMPLAINT:  CP/ Acute CVA  Brief History   79 yoF w/poorly controlled HTN and IDDM presenting with CP and left arm pain w/BP 220/117. Initial CTH neg.  Cardiac workup negative for acute event thus far, cardiology following.  Code stroke initiated for R hemiplegia, dysarthria and right facial droop.  Out of window for TPA.  Requiring cleviprex for BP control.  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  Evolving pontine stroke on MRI. Intubated 10/10 for neuro IR s/p stenting and angioplasty with 80-90% patency.  Extubated post procedure but due to insufficency placed on BiPAP and brought to ICU.  Required reintubation and ultimately trach (10/21 JY).  Peg tube placed 10/21 by CCS.  Taken to OR 10/28 for open gastrostomy and I&D of abdominal wall abscess.    Past Medical History  HTN, IDDM, hypothyroidism s/p ablation  Significant Hospital Events   10/9 present to [facility] ED -transferred to [facility] 10/9 basilar artery stenting 10/9 intubated urgently 6h post procedure for inability to protect airway. 10/10 CTA for declining exam - evolving pontine infarct, stent deemed to be patent 10/14 Brillinta stopped and ASA/ heparin started to in anticipation of tracheostomy on Monday. 10/14 through 10/19: Neuro exam has been stable sodium increasing in spite of free water via tube, providing supportive care awaiting trach 10/20: Sodium down to 151 from 156 clinically looks the same 10/21: Sodium down to 148, sputum culture sent for purulent tracheal secretions, awaiting trach and PEG.  Purulent bronchial secretions from right upper lobe sent for BAL 10/22: Tracheostomy and PEG completed on the 21st tolerated well.  Spiking low-grade fever white cells continued to climb starting empiric vancomycin and ceftaz.  Sodium improved down to  146.  Blood pressure improving but still not at goal added hydrochlorothiazide.  Resumed Brilinta.  10/23: Sodium normal.  Backing down on free water.  Glycemic control challenging, increased basal NovoLog in addition to Lantus and sliding scale.  Still spiking fever, white blood cell count climbing, sending blood cultures.  Foley catheter being placed for pressure ulcer caused by pure wick device 10/27 Foul smelling dark trach secretions, PEG secretions  10/28 to OR for ex lap due to PEG dislodgement.  PEG replaced 10/29 remains on MV 2/2 tachypnea; family meeting  Consults: date of consult/date signed off & final recs:  Cards 10/10 Neurology 10/10 PCCM 10/10 CCS 10/17  Procedures (surgical and bedside):  10/10 Neuro IR >> s/p stent and angioplasty of distal basilar artery  10/10 ETT for procedure 10/10 ETT (reintubated) >> removed 10/21: Size 6 tracheostomy placed by Dr. Nelda Marseille 10/26 changed to #4 trach cuffless  10/28 Ex lap with replacement of gastrostomy tube due to dislodgement  Significant Diagnostic Tests:  10/10  CTH >>  No acute intracranial abnormalities. 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 No definite acute thrombus identified. Severe stenosis left posterior cerebral artery and moderate stenosis right posterior cerebral artery. Mild stenosis left MCA and moderate stenosis left MCA bifurcation. No significant carotid or vertebral artery stenosis in the neck. 10/10 MRI >> evolving pontine infarct. 10/11 ECHO >> normal LVSF with  severe LVH. 10/28 CT A / P > dislodged PEG tube outside of the peritoneum and not within the stomach.  Cholelithiasis. No bowel obstruction.  No hydronephrosis.  Micro Data:  10/10 MRSA PCR >> negative 10/21 Sputum culture >> nl  resp flora  10/23 Blood cultures x 2 >> negative 10/26 Blood cultures x 2 >> 10/28 MRSA PCR >> neg 10/27 Sputum culture >>  Antimicrobials:  10/10 cefazolin preop 10/22 Vancomycin >> 10/22 Ceftaz >>  Subjective:  Family requested patient be transferred to [facility] yesterday per Neuro notes No acute events overnight Tmax 101.6  Remains on full MV 2/2 tachypnea  Objective   Blood pressure 124/62, pulse 85, temperature 100.1 F (37.8 C), temperature source Axillary, resp. rate (!) 31, height _0  (1.626 m), weight 128.3 kg, last menstrual period 07/26/2018, SpO2 100 %.    Vent Mode: PRVC FiO2 (%):  [30 %] 30 % Set Rate:  [15 bmp] 15 bmp Vt Set:  [420 mL] 420 mL PEEP:  [5 cmH20] 5 cmH20 Pressure Support:  [10 cmH20] 10 cmH20 Plateau Pressure:  [15 cmH20-18 cmH20] 18 cmH20   Intake/Output Summary (Last 24 hours) at 09/03/2018 0729 Last data filed at 09/03/2018 0700 Gross per 24 hour  Intake 5196.87 ml  Output 1605 ml  Net 3591.87 ml   Filed Weights   08/22/2018 0415 09/02/18 0500 09/03/18 1610  Weight: 128.6 kg 128.1 kg 128.3 kg    Examination:  General:  Chronically ill appearing female on MV in NAD HEENT: MM pink/moist, midline cuffed trach -site with lower pressure dressing, forehead diaphoretic Neuro: Eyes opens, tracks, nods intermittently to questions, no movement in extremities, grimaces with painful stimuli CV: rrr, no murmur  PULM: tachypneic in 30's on full MV, coarse breath sounds, mod secretions GI: obese, midline abd dressing/ peg, hypo BS  Extremities: warm/dry, trace BLE edema  Skin: no rashes   Assessment & Plan:   Respiratory failure/ hypoxemia due to inability to protect airway from cva -Expected airway difficulties with location of stroke s/p tracheostomy 10/21 P: Continue on full vent support for now Attempted SBT on my exam - pt with rr into 40's and low TVs, defer further SBTs if remains tachypneic Continue bronchial hygiene Intermittent  CXR PCCM will continue to follow for trach care   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  - stable WBC P: Follow pending trach and blood cultures Continue ceftaz, vanco  Hypertensive Crisis P: Continue coreg, hydralazine, norvasc, hctz per primary   Hypernatremia - resolved after free water P: Continue Free water 200 ml Q8   AKI P: Trend BMP/ UOP  Dislodged PEG tube with associated abdominal wall absecess - s/p ex lap with I&D and replacement of PEG 10/28 P: Post op care per surgery   Disposition / Summary of Today's Plan 09/03/18   PCCM will continue to follow 2-3x / week for trach care/ MV Continue full MV for now, likely will need till abd process improves  Best Practice  Diet: TFs Pain/Anxiety/Delirium protocol (if indicated): RASS goal 0  VAP protocol (  if indicated): Ordered DVT prophylaxis: SCDs  GI prophylaxis: Famotidine Hyperglycemia protocol: SSI, lantus Mobility: Bedrest Code Status: Full  Family: Family held 10/29 with Dr. Leonie Man.  No family at bedside 10/30.    CC time: 43 min  Kennieth Rad, AGACNP-BC [facility] Pgr: [phone] or if no answer [phone] 09/03/2018, 8:22 AM

## 2018-09-03 NOTE — Progress Notes (Addendum)
PT Cancellation Note  Patient Details Name: Andrea [name] MRN: [id number] DOB: 01/13/1961   Cancelled Treatment:    Reason Eval/Treat Not Completed: Patient not medically ready.  Pt is tachypenic , febrile, RR in the mid thirties to low 40s at rest.  RN advised not working with pt today.  PT agrees.  PT to follow along daily re: pt's progress.  Thanks,    Barbarann Ehlers. Kamorie Aldous, PT, DPT  Acute Rehabilitation [phone] pager #[phone] office   09/03/2018, 3:59 PM

## 2018-09-04 DIAGNOSIS — Z515 Encounter for palliative care: Secondary | ICD-10-CM

## 2018-09-04 DIAGNOSIS — Z7189 Other specified counseling: Secondary | ICD-10-CM

## 2018-09-04 LAB — GLUCOSE, CAPILLARY
GLUCOSE-CAPILLARY: 122 mg/dL — AB (ref 70–99)
GLUCOSE-CAPILLARY: 101 mg/dL — AB (ref 70–99)
Glucose-Capillary: 103 mg/dL — ABNORMAL HIGH (ref 70–99)
Glucose-Capillary: 107 mg/dL — ABNORMAL HIGH (ref 70–99)
Glucose-Capillary: 117 mg/dL — ABNORMAL HIGH (ref 70–99)
Glucose-Capillary: 147 mg/dL — ABNORMAL HIGH (ref 70–99)

## 2018-09-04 LAB — BASIC METABOLIC PANEL
ANION GAP: 6 (ref 5–15)
BUN: 56 mg/dL — ABNORMAL HIGH (ref 6–20)
CHLORIDE: 118 mmol/L — AB (ref 98–111)
CO2: 22 mmol/L (ref 22–32)
Calcium: 11.3 mg/dL — ABNORMAL HIGH (ref 8.9–10.3)
Creatinine, Ser: 1.31 mg/dL — ABNORMAL HIGH (ref 0.44–1.00)
GFR, EST AFRICAN AMERICAN: 51 mL/min — AB (ref >60)
GFR, EST NON AFRICAN AMERICAN: 44 mL/min — AB (ref >60)
Glucose, Bld: 118 mg/dL — ABNORMAL HIGH (ref 70–99)
POTASSIUM: 4 mmol/L (ref 3.5–5.1)
SODIUM: 146 mmol/L — AB (ref 135–145)

## 2018-09-04 LAB — CBC
HEMATOCRIT: 24.8 % — AB (ref 36.0–46.0)
Hemoglobin: 7 g/dL — ABNORMAL LOW (ref 12.0–15.0)
MCH: 23.7 pg — ABNORMAL LOW (ref 26.0–34.0)
MCHC: 28.2 g/dL — AB (ref 30.0–36.0)
MCV: 84.1 fL (ref 80.0–100.0)
NRBC: 0.3 % — AB (ref 0.0–0.2)
PLATELETS: 523 10*3/uL — AB (ref 150–400)
RBC: 2.95 MIL/uL — ABNORMAL LOW (ref 3.87–5.11)
RDW: 17.1 % — AB (ref 11.5–15.5)
WBC: 14.8 10*3/uL — AB (ref 4.0–10.5)

## 2018-09-04 LAB — CULTURE, BLOOD (ROUTINE X 2)
Culture: NO GROWTH
Culture: NO GROWTH
Special Requests: ADEQUATE
Special Requests: ADEQUATE

## 2018-09-04 MED ORDER — MORPHINE SULFATE (PF) 2 MG/ML IV SOLN
2.0000 mg | INTRAVENOUS | Status: DC | PRN
  Administered 2018-09-05 (×2): 2 mg via INTRAVENOUS
  Administered 2018-09-06: 4 mg via INTRAVENOUS
  Filled 2018-09-04 (×2): qty 1
  Filled 2018-09-04: qty 2

## 2018-09-04 MED ORDER — MORPHINE SULFATE (PF) 2 MG/ML IV SOLN: 2.0000 mg | INTRAVENOUS | Status: DC | PRN

## 2018-09-04 MED ORDER — SODIUM CHLORIDE 0.9 % IV SOLN
2.0000 g | INTRAVENOUS | Status: DC
  Administered 2018-09-04 – 2018-09-07 (×4): 2 g via INTRAVENOUS
  Filled 2018-09-04 (×5): qty 20

## 2018-09-04 MED ORDER — METRONIDAZOLE IN NACL 5-0.79 MG/ML-% IV SOLN
500.0000 mg | Freq: Three times a day (TID) | INTRAVENOUS | Status: DC
  Administered 2018-09-04 – 2018-09-08 (×12): 500 mg via INTRAVENOUS
  Filled 2018-09-04 (×12): qty 100

## 2018-09-04 NOTE — Progress Notes (Signed)
Pharmacy Antibiotic Note  Andrea [name] is a 57 y.o. female admitted on 08/09/2018 with an acute ischemic stroke. She currently trach-dependent and has a PEG. Patient continues on ceftazidime due to concern for HCAP.   Currently on day #10 of therapy. Pt with Tmax 101 and WBC is elevated at 15.3. Scr bumped up yesterday but is back down slightly to 1.31 today. Dose remains appropriate.   Plan: Continue ceftazidime to 2 gm IV Q 8 hours  F/u renal fxn, C&S, clinical status  MD - Please evaluate planned LOT  Height: 5\' 4"  (162.6 cm) Weight: 282 lb 6.6 oz (128.1 kg) IBW/kg (Calculated) : 54.7  Temp (24hrs), Avg:99.5 F (37.5 C), Min:98 F (36.7 C), Max:101 F (38.3 C)  Recent Labs  Lab 08/30/18 0052 08/30/18 0951 08/31/18 0114 08/31/2018 0525 08/22/2018 1434 09/02/18 0617 09/03/18 0421 09/04/18 0753  WBC 20.8*  --  19.7* 17.3*  --  12.5* 15.3*  --   CREATININE 1.19*  --  1.27* 1.11*  --  1.16* 1.54* 1.31*  VANCOTROUGH  --  26*  --   --  24*  --   --   --     Antimicrobials this admission: 10/22 vancomycin >10/30 10/22 ceftazidime >  Dose adjustments this admission: 10/26: 11 hr VT 26 on Vanc 1 gm IV Q 12 hours>> decrease to 750 mg IV Q 12 h 10/28: 10.5 VT 24 on Vanc 750 mg IV Q 12 hours>> decrease to 500 mg IV Q 12 h  Microbiology results: 10/27 trach asp - reincubated 10/26 blood x 2 - ngtd 10/23 blood x 2 - negative 10/21 resp cx: NF 10/10 MRSA PCR negative  Salome Arnt, PharmD, BCPS Please see AMION for all pharmacy numbers 09/04/2018 9:08 AM

## 2018-09-04 NOTE — Progress Notes (Signed)
STROKE TEAM PROGRESS NOTE   SUBJECTIVE (INTERVAL HISTORY) Pt RN is at the bedside. She  remains neurologically unchanged.continues to have  fever and elevated white count. She continues to have significant drainage from her abdominal wall abscess drainage site with foul smelling odor. CT abdomen pelvis yesterday did not show any significant change. Trauma team feels discharge from the [place] site is much less and midline wound is also clean OBJECTIVE Vitals:   09/04/18 1100 09/04/18 1200 09/04/18 1300 09/04/18 1530  BP: 128/86 (!) 151/75 (!) 145/77 125/73  Pulse: 84 (!) 107 (!) 106 95  Resp: (!) 33 (!) 38 (!) 35 (!) 30  Temp:  (!) 101.5 F (38.6 C)    TempSrc:  Axillary    SpO2: 96% 97% 98% 100%  Weight:      Height:        CBC:  Recent Labs  Lab 09/03/18 0421 09/04/18 1106  WBC 15.3* 14.8*  HGB 7.1* 7.0*  HCT 24.8* 24.8*  MCV 84.9 84.1  PLT 475* 523*    Basic Metabolic Panel:  Recent Labs  Lab 08/29/18 0614  09/03/18 0421 09/04/18 0753  NA 139   < > 146* 146*  K 4.2   < > 3.6 4.0  CL 105   < > 114* 118*  CO2 25   < > 23 22  GLUCOSE 229*   < > 148* 118*  BUN 37*   < > 57* 56*  CREATININE 1.12*   < > 1.54* 1.31*  CALCIUM 11.1*   < > 11.5* 11.3*  MG 2.2  --   --   --   PHOS 2.9  --   --   --    < > = values in this interval not displayed.    Lipid Panel:     Component Value Date/Time   CHOL 256 (H) 08/15/2018 0512   TRIG 248 (H) 08/15/2018 0512   HDL 35 (L) 08/15/2018 0512   CHOLHDL 7.3 08/15/2018 0512   VLDL 50 (H) 08/15/2018 0512   LDLCALC 171 (H) 08/15/2018 0512   HgbA1c:  Lab Results  Component Value Date   HGBA1C 11.4 (H) 08/15/2018   Urine Drug Screen:     Component Value Date/Time   LABOPIA NONE DETECTED 08/12/2018 1836   COCAINSCRNUR NONE DETECTED 08/13/2018 1836   LABBENZ NONE DETECTED 09/02/2018 1836   AMPHETMU NONE DETECTED 08/15/2018 1836   THCU NONE DETECTED 08/13/2018 1836   LABBARB NONE DETECTED 08/28/2018 1836    Alcohol Level  No results found for: ETH  IMAGING  Ct Angio Head W Or Wo Contrast Ct Angio Neck W Or Wo Contrast 08/15/2018 IMPRESSION:  1. Interval basilar to left proximal PCA stenting. The density of the stent walls precludes detection of in stent stenosis; there is a degree of wasting at the mid basilar segment. There is flow in the left PCA beyond the stent such that there is presumed stent patency.  2. Known lower pontine infarct. There are new small cerebellar infarcts since brain MRI yesterday.  3. Severe left P2 segment stenosis, also seen previously.  4. Moderate atheromatous narrowing in the proximal left MCA.    Ct Angio Head W Or Wo Contrast Ct Angio Neck W Or Wo Contrast 08/20/2018 IMPRESSION:  1. 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 No definite acute thrombus identified.  2. Severe stenosis left posterior cerebral artery and moderate stenosis right posterior cerebral artery.  3. Mild  stenosis left MCA and moderate stenosis left MCA bifurcation.  4. No significant carotid or vertebral artery stenosis in the neck.    Ct Head Wo Contrast 08/25/2018 IMPRESSION:  No acute intracranial abnormalities. Mild white matter changes likely due to small vessel ischemia.    Mr Brain Wo Contrast 08/07/2018 IMPRESSION:  1. Acute nonhemorrhagic infarct involving the left paramedian brainstem. The infarct crosses midline.  2. Other periventricular and subcortical white matter disease is moderately advanced for age. This likely reflects the sequela of chronic microvascular ischemia.  3. Tapering of the dens with prominent soft tissue pannus. 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    Ct Head Code Stroke Wo Contrast 08/11/2018  IMPRESSION:  1. No acute abnormality and no change from earlier today  2. ASPECTS is 10 3.    Cerebral Angiogram 08/26/2018 S/P 4 vessel cerebral arteriogram RT CFA approach. Findings   1.Severe stenosis of distal basilar artery 90 % ,associated with mod to severe ASVD of the mid basilar artery and Lt ANt cerebellar A. S/P stent assisted angioplasty of distal basilar artery with patency of 80 to 90 %. 2.Approx 70 % stenosis of LT ICA supraclinoid seg   MRI 08/15/18 1. Progressive acute pontine infarct. New patchy bilateral cerebellar infarction. New tiny left thalamic infarcts. 2. Preserved flow void in the stented basilar. 3. left facial/submandibular edema, please correlate with neck exam. Edited result: IMPRESSION: 1. Progressive acute pontine infarct. New patchy bilateral cerebellar infarction. New tiny left thalamic infarcts. 2. Preserved flow void in the stented basilar.  TTE - Left ventricle: The cavity size was normal. There was severe   concentric hypertrophy. Systolic function was normal. The   estimated ejection fraction was in the range of 60% to 65%. Wall   motion was normal; there were no regional wall motion   abnormalities. Doppler parameters are consistent with abnormal   left ventricular relaxation (grade 1 diastolic dysfunction). The   E/e&' ratio is <8, suggesting normal LV filling pressure. - Aortic valve: Trileaflet; mildly calcified leaflets.   Transvalvular velocity was minimally increased. There was no   regurgitation. Mean gradient (S): 12 mm Hg. - Mitral valve: Mildly thickened leaflets . There was trivial   regurgitation. - Left atrium: The atrium was normal in size. - Inferior vena cava: The vessel was dilated. The respirophasic   diameter changes were blunted (< 50%), consistent with elevated   central venous pressure. Impressions: - LVEF 60-65%, severe LVH, normal wall motion, grade 1 DD, normal   LV filling pressure, minimally increased aortic velocity without   signficant stenosis, trivial MR, normal LA size, dilated IVC.  Dg Chest Port 1 View  Result Date: 08/23/2018 CLINICAL DATA:  Encounter for fever. EXAM: PORTABLE  CHEST 1 VIEW COMPARISON:  08/19/2018 FINDINGS: Endotracheal tube is 3.6 cm above the carina. Airspace densities in the right hilum and right lower lung have markedly decreased and possibly resolved. There may be a small amount of residual disease in the medial right lower lung. Vascular crowding in the hilar regions. Low lung volumes. Heart size is grossly stable and within normal limits. Nasogastric tube extends into the abdomen. Negative for pneumothorax. IMPRESSION: Airspace disease in the right lung has markedly decreased. Low lung volumes. Support apparatuses as described. Electronically Signed   By: Markus Daft M.D.   On: 08/23/2018 11:07    DG Chest 1 View 08/30/2018 IMPRESSION: Cardiomegaly and mild pulmonary venous congestion/edema. No other change.   PHYSICAL EXAM  Temp:  [98 F (36.7 C)-101.5  F (38.6 C)] 101.5 F (38.6 C) (10/31 1200) Pulse Rate:  [78-107] 95 (10/31 1530) Resp:  [18-39] 30 (10/31 1530) BP: (117-160)/(64-86) 125/73 (10/31 1530) SpO2:  [95 %-100 %] 100 % (10/31 1530) FiO2 (%):  [30 %] 30 % (10/31 1530) Weight:  [128.1 kg] 128.1 kg (10/31 0500)  General -  Obese middle-aged African-American lady, in distress. She has abdominal surgical wound and drainage from the abdominal wall Ophthalmologic - fundi not visualized due to noncooperation.  Cardiovascular - not tachycardic currently in the 80s RRR  Neuro - post trach, on trach collar, off sedation. Eyes open, awake, alert. No ptosis or EOMI, denies diplopia.  PERRL. blinking to visual threat bilaterally. Right gaze nystagmus   improved, can follow and track examiner across the room. Bilateral facial weakness, R>L, corneal, gag and cough present. Able to open mouth slightly but not able to protrude tongue. Quadriplegia, with left LE intermittent  flicker, proximal and 0/5 distal, grimaces to pain in the right UE. DTR diminished throughout. Coordination and gait not tested.   ASSESSMENT/PLAN Andrea [name] is  a 57 y.o. female with history of difficult to control hypertension, insulin-dependent diabetes, obesity, hypothyroidism status post ablation presenting with chest discomfort, right-sided weakness, slurred speech and right facial droop. She did not receive IV t-PA due to late presentation. S/P stent assisted angioplasty of distal basilar artery.  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 Worsening symptoms with extension of pontine/medullary infarcts with new small bilateral cerebellar infarcts without evidence of stent re-stenosis or occlusion  Resultant quadriplegia, on trach and peg  CT head - No acute intracranial abnormalities.  CTA H&N 08/07/2018 - Extensive atherosclerotic disease in the basilar with focal critical stenosis in the mid to distal basilar. No definite acute thrombus identified. Severe stenosis left posterior cerebral artery  MRI head - Acute nonhemorrhagic infarct involving the left paramedian brainstem.   CTA H&N 08/15/2018 - stent too dense to see BA lumen, but distal flow preserved. New small cerebellar infarcts since brain MRI yesterday.  Severe left P2 segment stenosis, also seen previously.   Repeat MRI - extension of b/l pontine and upper medullary infarcts with new b/l cerebellar infarcts.  2D Echo - EF 60-65%  LDL - 174  HgbA1c - 11.1  VTE prophylaxis - heparin subq  aspirin 81 mg daily prior to admission, now resumed on Brilinta 90 mg bid and ASA 81 mg daily.   Patient counseled to be compliant with her antithrombotic medications  Ongoing aggressive stroke risk factor management  Therapy recommendations:  LTACH  Disposition:  Pending  Intracranial stenosis  BA mid to distal severe stenosis s/p BA stenting  Left PCA severe stenosis, R PCA moderate stenosis  Left MCA moderate stenosis  Uncontrolled stroke risk factors with HLD, HTN, DM  Non compliance with meds at home  Respiratory failure  S/p  trach 08/11/2018  On trach collar now, tolerating well  Still has secretions, improved  CCM following with trach intermittently  Hypertension  Stable  BP goal 130-150   On po norvasc, losartan  Metoprolol changed to Coreg 25 bid, also on HCTZ 25mg  now  Also on low dose hydralazine 25mg  tid  Hyperlipidemia  Lipid lowering medication PTA:  Lipitor 20 mg daily  LDL 174, goal < 70  Increase to 80 mg daily  Continue statin at discharge  Diabetes  HgbA1c 11.4, goal < 7.0  Uncontrolled  Hyperglycemia  Lantus increase from 15U bid to  18U bid   Continue basal NovoLog increase from 4U Q4h to 6U Q4h  SSI  CBG monitoring - glucose 272 -> Diabetic nurse consult  Infection no doubt contributing to elevated glucose.  Low grade fever with leukocytosis  Tmax 100.7 -> afebrile->100.7->101-> afebrile   Leukocytosis - 12.7->12.9->14.4->14.3->16.1->20.6->19.4->19.0 -> 20.8->19.7  UA neg for UTI  CXR unremarkable, improving from prior  Sputum culture normal flora  On vanco and fortaz for empiric treatment     U/A abnormal - culture pending ; Blood cultures negative 10/26 ; CXR  Not C/W pneumonia  Respiratory cultures - mixed flora    Abdominal wall abscess -s/p open surgical drainage 08/08/2018    Hypernatremia and elevated Cre/BUN  Na 156->156->151->148->146->141->137->139  Cre 1.41->1.40->1.22->1.16->1.22->1.18->1.06->1.12-> 1.19-> 1.27  Put on NS @ 50  On TF @ 45  Add free water 200mg  Q8h  Continue BMP monitoring  Dysphagia s/p PEG  Continue tube feeding 45 cc/h  PEG done 08/15/2018  Anemia   Hb 11.1->9.7->9.4->9.3->9.4->8.8->8.2->7.9->7.7->7.0 -> 8.8-> 8.7  Likely due to iron deficiency and acute uterine bleeding  Iron panel showed iron deficiency  Put on iron pills  PRBC transfusion 2 U Friday 08/29/2018  CBC monitoring  Abnormal uterine bleeding  Has been following with GYN  Was on megace in the past  Will resume megace 80mg   bid  Continue ASA and brilinta for now  Vaginal bleeding much improved as per RN  Close CBC monitoring  Other Stroke Risk Factors  Obesity, Body mass index is 48.48 kg/m., recommend weight loss, diet and exercise as appropriate   Other Active Problems  Hypokalemia - on supplement for K 3.3->4.2 -> 4.5-> 3.8  Plts - 489 -> 553-> [facility] day # 21 Plan   Continue antibiotics.but consider changing ceftazidime for broader anaerobic coverage. Discussed with Dr. Grandville Silos and Dr. Linus Salmons from infectious disease Follow-up of medical issues as per critical care team. Patient's prognosis is quite poor and given her significant multiple medical problems chances of her surviving and making meaningful clinical recovery are quite bleak. I had a family meeting today with multiple to discuss goals of care and recommend palliative care consult. Discussed with Dr. Valeta Harms critical care medicine  .Marland Kitchen  This patient is critically ill due to worsening brainstem infarct, BA stenosis s/p stenting, hyperglycemia, currently febrile and tachypnic requiring frequent suctioning, elevated WBCs,  +leukocutes in urine, hypertensive,  anemia and hypernatremia and at significant risk of neurological worsening, death form recurrent stroke, hemorrhagic conversion, CHF, seizure, aspiration, hemorrhagic shock.   This patient is critically ill and at significant risk of neurological worsening, death and care requires constant monitoring of vital signs, hemodynamics,respiratory and cardiac monitoring,review of multiple databases, neurological assessment, discussion with family, other specialists and medical decision making of high complexity.I  I spent 35 minutes of neurocritical care time in the care of this patient.  Andrea Contras MD      To contact Stroke Continuity provider, please refer to [email or web address] After hours, contact General Neurology

## 2018-09-04 NOTE — Progress Notes (Signed)
Physical Therapy Treatment Patient Details Name: Andrea [name] MRN: [id number] DOB: 1961-04-11 Today's Date: 09/04/2018    History of Present Illness 57 yo presented to ED with chest pain noted Rt hemiparesis in ED with acute Left paramedian brainstem infarct. Intubated 10/10 for angioplasty, extubated post procedure and reintubated. Repeat MRI with brainstem infarct extension and bil cerebellar infarcts. Peg/trach 08/20/2018.PMHx: HTN, DM, CKD    PT Comments    Patient not progressing, in fact less participative and seemingly agitated at movement in bed.  Feel she may improve and tolerate PT more if fevers can be controlled.  Feel she will need prolonged rehab if able to progress from ICU/acute setting.  Will follow along as tolerated.   Follow Up Recommendations  [facility] bed;Wheelchair (measurements PT);Other (comment)    Recommendations for Other Services       Precautions / Restrictions Precautions Precautions: Fall Precaution Comments: Peg/trach    Mobility  Bed Mobility               General bed mobility comments: deferred due to pt grimacing with movement in bed and not participating   Transfers                    Ambulation/Gait                 Stairs             Wheelchair Mobility    Modified Rankin (Stroke Patients Only) Modified Rankin (Stroke Patients Only) Pre-Morbid Rankin Score: No symptoms Modified Rankin: Severe disability     Balance                                            Cognition Arousal/Alertness: Awake/alert Behavior During Therapy: WFL for tasks assessed/performed Overall Cognitive Status: Difficult to assess                                 General Comments: not following commands to assist with ROM in bed, somewhat trying to look where I asked her to look      Exercises Other Exercises Other Exercises: performed UE/LE  PROM Other Exercises: general neck ROM    General Comments        Pertinent Vitals/Pain Faces Pain Scale: Hurts little more Pain Location: with LE and head movement Pain Descriptors / Indicators: Discomfort;Grimacing Pain Intervention(s): Monitored during session;Repositioned;Limited activity within patient's tolerance    Home Living                      Prior Function            PT Goals (current goals can now be found in the care plan section) Progress towards PT goals: Not progressing toward goals - comment    Frequency    Min 2X/week      PT Plan Current plan remains appropriate    Co-evaluation              AM-PAC PT "6 Clicks" Daily Activity  Outcome Measure  Difficulty turning over in bed (including adjusting bedclothes, sheets and blankets)?: Unable Difficulty moving from lying on back to sitting on the side of the bed? : Unable Difficulty sitting down on and standing up from a chair  with arms (e.g., wheelchair, bedside commode, etc,.)?: Unable Help needed moving to and from a bed to chair (including a wheelchair)?: Total Help needed walking in hospital room?: Total Help needed climbing 3-5 steps with a railing? : Total 6 Click Score: 6    End of Session Equipment Utilized During Treatment: Oxygen;Other (comment)(trach coller) Activity Tolerance: Patient limited by pain(grimacing, pt diaphoretic) Patient left: in bed   PT Visit Diagnosis: Other abnormalities of gait and mobility (R26.89);Muscle weakness (generalized) (M62.81);Other symptoms and signs involving the nervous system (R29.898);Hemiplegia and hemiparesis Hemiplegia - Right/Left: Right Hemiplegia - dominant/non-dominant: Dominant Hemiplegia - caused by: Cerebral infarction     Time: 1125-1140 PT Time Calculation (min) (ACUTE ONLY): 15 min  Charges:  $Therapeutic Exercise: 8-22 mins                     Magda Kiel, [place] [facility] [phone] 09/04/2018    Reginia Naas 09/04/2018, 2:13 PM

## 2018-09-04 NOTE — Consult Note (Signed)
Consultation Note Date: 09/04/2018   Patient Name: Andrea [name]  DOB: [birth year]  MRN: [id number]  Age / Sex: 57 y.o., female  PCP: Danna Hefty, DO Referring Physician: Garvin Fila, MD  Reason for Consultation: Establishing goals of care  HPI/Patient Profile: 57 y.o. female  with past medical history of diabetes, HTN, heart failure, CKD, anxiety, depression, hyperthyroid admitted on 08/22/2018 with symptoms of slurring speech, R facial droop, difficulty breathing. Workup eventually revealed evolving pontine stroke, occluded basilar artery (underwent angiogram with stenting). Post-op she remained hypertensive and followup CT showed progressing infarction into her brainstem and new cerebellar infarcts- she required intubation with eventual trach and PEG on 10/21. She was progressing with therapies and off of vent for a few days, however, began to decline again with lethargy, fevers. Purulent drainage from her PEG noted around 10/28. CT scan showed dislodged PEG tube with abdominal wall thickening and air, but no fluid collection or abscess, however, patient was very tender and there was foul smelling drainage from the wound. She went to OR 10/28 for I&D and replacement of G-tube. Op note shows that G-tube tip was found to be in the subcutaneous tissue, penrose drain was placed into a subcutaneous abscess. Now post op, remains on vent support. Minimal progression in PT, less participative. Palliative medicine consulted for [place].   Clinical Assessment and Goals of Care:  I have reviewed medical records including EPIC notes, labs and imaging, received report from patient's RN, assessed the patient and then met in conference with multiple family members including patient's nieces and sisters to discuss diagnosis prognosis, GOC, EOL wishes, disposition and options.  I introduced Palliative Medicine as  specialized medical care for people living with serious illness. It focuses on providing relief from the symptoms and stress of a serious illness. The goal is to improve quality of life for both the patient and the family.  We discussed a brief life review of the patient. She worked as Technical brewer, and a Radio broadcast assistant, cared for many grandchildren and raised nieces as her own children. She is not married. Has three children- a 37 yr old son who is incarcerated, an 45 yr old son and a 43 yr old son. Her 25 yr old son is unreachable. Her 85 yr old son is too upset to be involved in decision making per family. Discussed with family that legally her adult children are decision makers- in this case- is 69yrold son who is available. Next in line are sisters. FSuzan Slickis primary person who is present and communicates with 192yr old son SOneal Deputy   As far as functional and nutritional status- prior to admission patient was independent and fully functional.     We discussed their current illness and what it means in the larger context of their on-going co-morbidities.  Natural disease trajectory and expectations at EOL were discussed. Much time was spent answering questions related to patient's hospitalization, disease process, treatment options and prognosis. Family expressed frustration with communication from multiple medical  providers.   I attempted to elicit values and goals of care important to the patient. 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 Although they do state that she wouldn't want to be "kept down". She was very independent. She would want to try and return to a state of independence.    Family was very focused on asking questions about patient's care and disease trajectory. Meeting was quite protracted.  I encouraged primary decision makers- mainly patient's sisters- to consider what patient would consider as "quality of life" and would draw as a "bottom line". There was some discussion that  patient would not want to continue to be kept alive forever by artificial feeding if it was certain that she would not regain the ability to swallow and eat foods that she enjoyed. Family stated that as long as patient was awake and mouthing words they would continue to want full scope care.   Family requests more time be given to recover from abdominal surgery before discussions of [place]. They also are requesting inquiry into possible transfer to [facility].  Advance directives were not discussed- meeting focused primarily on addressing family's care concerns, identifying decision makers, and rapport building.   Primary Decision Maker NEXT OF KIN- patient's son- Ariannah Arenson    SUMMARY OF RECOMMENDATIONS -Family would like inquiry into transfer to [place] current care -Family is concerned that patient's breathing status does better with alternative to fentanyl- will transition to morphine for pain and this may also address increased RR -Identified primary family communication source as [name]- recommend providers meet and communicate with [name] (patient's sister) and let [name] communicate with everyone else, to simplify communication- family agreed that [place] and [place] may also be useful in communication- but [name] should be primary person for communication -Recommend further [place] meeting with patient's son and sisters only present- PMT will try and arrange for Monday 11/1 if patient is not transferred   Code Status/Advance Care Planning:  Full code  Palliative Prophylaxis:   Delirium Protocol and Frequent Pain Assessment  Additional Recommendations (Limitations, Scope, Preferences):  Full Scope Treatment  Prognosis:    Unable to determine  Discharge Planning: To Be Determined  Primary Diagnoses: Present on Admission: . Chest pain . Hypertensive urgency . Anemia, iron deficiency . CKD (chronic kidney disease), stage III ([place]) . Acute ischemic stroke  ([place]) . Basilar artery stenosis with infarction [facility])   I have reviewed the medical record, interviewed the patient and family, and examined the patient. The following aspects are pertinent.  Past Medical History:  Diagnosis Date  . Diabetes mellitus   . Environmental allergies   . Fibroid   . High cholesterol   . Hx of cardiovascular stress test    a. ETT-MV 2/14:  EF 46% (normal by echo), low risk, no ischemia or scar  . Hx of echocardiogram    Echo 2/14: mild LVH, EF 55-60%, Gr 1 diast dysfn, mild LAE  . Hypertension   . Hyperthyroidism    Radioactive iodine ablation   Social History   Socioeconomic History  . Marital status: Single    Spouse name: Not on file  . Number of children: 3  . Years of education: Not on file  . Highest education level: Not on file  Occupational History  . Occupation: unemployed  Social Needs  . Financial resource strain: Not on file  . Food insecurity:    Worry: Not on file    Inability: Not on file  .  Transportation needs:    Medical: Not on file    Non-medical: Not on file  Tobacco Use  . Smoking status: Never Smoker  . Smokeless tobacco: Never Used  Substance and Sexual Activity  . Alcohol use: No    Comment: occasional for special occasions  . Drug use: No  . Sexual activity: Yes  Lifestyle  . Physical activity:    Days per week: Not on file    Minutes per session: Not on file  . Stress: Not on file  Relationships  . Social connections:    Talks on phone: Not on file    Gets together: Not on file    Attends religious service: Not on file    Active member of club or organization: Not on file    Attends meetings of clubs or organizations: Not on file    Relationship status: Not on file  Other Topics Concern  . Not on file  Social History Narrative  . Not on file   Family History  Problem Relation Age of Onset  . Pneumonia Mother   . Diabetes Mother   . Hypertension Father   . Hypertension Sister   . Hyperlipidemia  Sister   . Hypertension Brother   . Hypertension Maternal Aunt   . Hypertension Paternal Aunt   . Cancer Paternal Aunt        throat cancer  . Diabetes Maternal Grandmother   . Diabetes Paternal Grandmother   . Cancer Paternal Grandmother        pancreatic cancer   Scheduled Meds: . amLODipine  10 mg Per Tube Daily  . aspirin  81 mg Per Tube Daily  . atorvastatin  80 mg Per Tube q1800  . carvedilol  25 mg Per Tube BID WC  . chlorhexidine gluconate (MEDLINE KIT)  15 mL Mouth Rinse BID  . docusate  100 mg Per Tube Daily  . feeding supplement (PRO-STAT SUGAR FREE 64)  60 mL Per Tube BID  . ferrous sulfate  325 mg Oral TID  . free water  200 mL Per Tube Q8H  . hydrALAZINE  25 mg Per Tube Q8H  . hydrochlorothiazide  25 mg Oral Daily  . insulin aspart  0-20 Units Subcutaneous Q4H  . insulin aspart  8 Units Subcutaneous Q4H  . insulin glargine  22 Units Subcutaneous BID  . liver oil-zinc oxide   Topical QID  . mouth rinse  15 mL Mouth Rinse 10 times per day  . megestrol  80 mg Per Tube BID  . multivitamin  15 mL Per Tube Daily  . pantoprazole sodium  40 mg Per Tube Daily  . ticagrelor  90 mg Per Tube BID   Continuous Infusions: . sodium chloride Stopped (09/04/18 0341)  . sodium chloride 50 mL/hr at 09/04/18 1300  . cefTAZidime (FORTAZ)  IV Stopped (09/04/18 1040)  . clevidipine Stopped (08/24/2018 1154)  . feeding supplement (GLUCERNA 1.2 CAL) 1,000 mL (09/04/18 0802)  . lactated ringers     PRN Meds:.sodium chloride, acetaminophen (TYLENOL) oral liquid 160 mg/5 mL, albuterol, bisacodyl, diphenhydrAMINE-zinc acetate, hydrALAZINE, HYDROcodone-acetaminophen, labetalol, morphine injection, ondansetron (ZOFRAN) IV, sennosides Medications Prior to Admission:  Prior to Admission medications   Medication Sig Start Date End Date Taking? Authorizing Provider  amLODipine (NORVASC) 10 MG tablet Take 1 tablet (10 mg total) by mouth daily. 05/16/18  Yes Elsie Stain, MD  aspirin EC 81 MG  tablet Take 81 mg by mouth daily.   Yes [provider]  atorvastatin (  LIPITOR) 20 MG tablet Take 1 tablet (20 mg total) by mouth daily. 01/29/18  Yes Verner Mould, MD  benzonatate (TESSALON) 100 MG capsule Take 1 capsule (100 mg total) by mouth every 8 (eight) hours. 12/16/17  Yes Maczis, Barth Kirks, PA-C  cloNIDine (CATAPRES) 0.2 MG tablet Take 1.5 tablets (0.3 mg total) by mouth 2 (two) times daily. 05/16/18  Yes Elsie Stain, MD  ferrous sulfate 325 (65 FE) MG EC tablet Take 1 tablet (325 mg total) by mouth 3 (three) times daily with meals. 01/17/17  Yes Hensel, Jamal Collin, MD  gabapentin (NEURONTIN) 300 MG capsule Take 1 capsule (300 mg total) by mouth 3 (three) times daily. Patient taking differently: Take 300 mg by mouth 3 (three) times daily as needed (nerve pain).  12/26/16  Yes Lysbeth Penner, FNP  hydrochlorothiazide (HYDRODIURIL) 25 MG tablet TAKE 1 TABLET (25 MG TOTAL) BY MOUTH DAILY. 05/16/18  Yes Elsie Stain, MD  ibuprofen (ADVIL,MOTRIN) 800 MG tablet Take 1 tablet (800 mg total) by mouth every 8 (eight) hours as needed. Patient taking differently: Take 800 mg by mouth every 8 (eight) hours as needed for headache or mild pain.  01/19/17  Yes Lawyer, Harrell Gave, PA-C  Insulin Glargine (LANTUS SOLOSTAR) 100 UNIT/ML Solostar Pen INJECT 27 UNITS INTO THE SKIN AT BEDTIME. 05/16/18  Yes Elsie Stain, MD  losartan (COZAAR) 25 MG tablet Take 1 tablet (25 mg total) by mouth daily. 05/16/18  Yes Elsie Stain, MD  megestrol (MEGACE) 40 MG tablet TAKE 2 TABLETS BY MOUTH TWICE A DAY , MAY INCREASE TO 3 TABLETS TWICE DAILY FOR HEAVY BLEEDING Patient taking differently: Take 80 mg by mouth 2 (two) times daily. May increase to 3 tablets twice daily for heavy bleeding 11/26/17  Yes Anyanwu, Sallyanne Havers, MD  metFORMIN (GLUCOPHAGE) 1000 MG tablet Take 1 tablet (1,000 mg total) by mouth 2 (two) times daily with a meal. 05/16/18  Yes Elsie Stain, MD  metoprolol succinate  (TOPROL XL) 50 MG 24 hr tablet Take 1 tablet (50 mg total) by mouth daily. Take with or immediately following a meal. 05/16/18  Yes Elsie Stain, MD  VOLTAREN 1 % GEL APPLY 2 G TOPICALLY 4 (FOUR) TIMES DAILY. Patient taking differently: Apply 2 g topically 4 (four) times daily as needed (pain).  01/16/17  Yes Bacigalupo, Dionne Bucy, MD  Insulin Pen Needle (B-D ULTRAFINE III SHORT PEN) 31G X 8 MM MISC USE AS DIRECTED TO INJECT INSULIN AT BEDTIME 01/15/18   Verner Mould, MD  Insulin Syringe-Needle U-100 (INSULIN SYRINGE .5CC/30GX1/2") 30G X 1/2" 0.5 ML MISC Use to inject insulin daily as prescribed 01/23/17   Virginia Crews, MD  sertraline (ZOLOFT) 50 MG tablet Take 50 mg by mouth daily.  01/19/12  [provider]   Allergies  Allergen Reactions  . Tramadol     "Makes me sees things and I just dont act right"   Review of Systems  Unable to perform ROS: Intubated    Physical Exam  Constitutional:  Tachycardic, diaphoretic  Cardiovascular:  tachycardic  Pulmonary/Chest:  Trach with vent support, increased RR  Neurological:  Awake, tracks, does not answer questions  Nursing note and vitals reviewed.   Vital Signs: BP (!) 145/77   Pulse (!) 106   Temp (!) 101.5 F (38.6 C) (Axillary) Comment: Nurse notified  Resp (!) 35   Ht 5' 4"  (1.626 m)   Wt 128.1 kg   LMP 07/26/2018   SpO2  98%   BMI 48.48 kg/m  Pain Scale: CPOT   Pain Score: Asleep   SpO2: SpO2: 98 % O2 Device:SpO2: 98 % O2 Flow Rate: .O2 Flow Rate (L/min): 8 L/min  IO: Intake/output summary:   Intake/Output Summary (Last 24 hours) at 09/04/2018 1429 Last data filed at 09/04/2018 1300 Gross per 24 hour  Intake 3778.22 ml  Output 3655 ml  Net 123.22 ml    LBM: Last BM Date: 08/29/18 Baseline Weight: Weight: 122.5 kg Most recent weight: Weight: 128.1 kg     Palliative Assessment/Data: PPS: 10%     Thank you for this consult. Palliative medicine will continue to follow and assist  as needed.   Time In: 1000 Time Out:1230 Time Total: 150 minutes Prolonged services billed: yes Greater than 50%  of this time was spent counseling and coordinating care related to the above assessment and plan.  Signed by: Mariana Kaufman, AGNP-C Palliative Medicine    Please contact Palliative Medicine Team phone at [phone] for questions and concerns.  For individual provider: See Shea Evans

## 2018-09-04 NOTE — Progress Notes (Signed)
Occupational Therapy Treatment Patient Details Name: Andrea [name] MRN: [id number] DOB: [birth year] Today's Date: 09/04/2018    History of present illness 57 yo presented to ED with chest pain noted Rt hemiparesis in ED with acute Left paramedian brainstem infarct. Intubated 10/10 for angioplasty, extubated post procedure and reintubated. Repeat MRI with brainstem infarct extension and bil cerebellar infarcts. Peg/trach 08/23/2018. 10/28 I & D of abdominal wall abscess with placement of penrose drain and G-tube.  PMHx: HTN, DM, CKD   OT comments  Pt with decline in functional movement as compared to last session. Diaphoretic with RR in low-  Mid 30s. HR 92; BP 125/73. Previously, pt moving LUE in flexor synergy pattern with active shoulder adduction/proximal movement and minimal digit movement; gross movement of LLE. Unable to facilitate movement of arms/legs today. Pt apparently recognizes this therapist and appropriately nodding head/ using eye blinks to communicate. Music used throughout session as pt loves "[place]". Pt mouthing home. When asked if she wanted to go home, pt nodded and looked up toward ceiling. Pt asked if she wanted to go to her house and she looked up toward ceiling. Pt asked if she meant going to heaven  and pt became tearful and nodded "yes" - discussed with nsg. Pt left in bed position so she could see out the window. Pt mouthing "thank you". Will reassess use of Tilt bed next week and continue to follow.   Follow Up Recommendations  LTACH;SNF;Supervision/Assistance - 24 hour    Equipment Recommendations  Other (comment)(TBA)    Recommendations for Other Services      Precautions / Restrictions Precautions Precautions: Fall Precaution Comments: Peg/trach(abdoimnal wound; Per Dr. Grandville Silos, no restrictions with movement)       Mobility Bed Mobility               General bed mobility comments: Modified chair position with bed @ 45 degrees  Transfers                      Balance     Sitting balance-Leahy Scale: Zero Sitting balance - Comments: unable to assist wtih trunk/head control  Attempting to facilitate head movement with eye gaze                                   ADL either performed or assessed with clinical judgement   ADL                                         General ADL Comments: total A at this time     Vision   Vision Assessment?: Vision impaired- to be further tested in functional context Additional Comments: nystagmus noted   Perception     Praxis      Cognition Arousal/Alertness: Awake/alert Behavior During Therapy: WFL for tasks assessed/performed(difficult to assess due to trach) Overall Cognitive Status: Difficult to assess                                 General Comments: following commands to blink eyes "1" for "yes" 2 for "no"; nodding head appropriately; smiling at therapist appropriately. Sustains vision to pictures on cell phone; appears to enjoy music        Exercises General Exercises - Upper  Extremity Shoulder Flexion: PROM;Both;10 reps;Supine Shoulder ABduction: PROM;Both;10 reps;Supine Shoulder ADduction: PROM;Both;10 reps;Supine Elbow Flexion: PROM;Both;10 reps;Supine Elbow Extension: PROM;Both;10 reps;Supine Wrist Flexion: PROM;Both;10 reps;Supine Wrist Extension: PROM;Both;10 reps;Supine Digit Composite Flexion: PROM;Both;5 reps;Supine Composite Extension: PROM;Both;5 reps;Supine Chair Push Up: PROM;Both;5 reps Other Exercises Other Exercises: performed UE/LE PROM Other Exercises: general neck ROM   Shoulder Instructions       General Comments      Pertinent Vitals/ Pain       Pain Assessment: Faces Faces Pain Scale: Hurts little more Pain Location: With extremity and head movemetn Pain Descriptors / Indicators: Discomfort;Grimacing Pain Intervention(s): Limited activity within patient's tolerance;Repositioned  Home  Living                                          Prior Functioning/Environment              Frequency  Min 2X/week        Progress Toward Goals  OT Goals(current goals can now be found in the care plan section)  Progress towards OT goals: Not progressing toward goals - comment;OT to reassess next treatment  Acute Rehab OT Goals Patient Stated Goal: per family for pt to get stronger OT Goal Formulation: With patient/family Time For Goal Achievement: 09/11/18 Potential to Achieve Goals: Fair ADL Goals Pt Will Perform Grooming: with max assist;bed level Pt Will Perform Upper Body Bathing: with max assist;bed level Pt/caregiver will Perform Home Exercise Program: Increased ROM;With written HEP provided;Both right and left upper extremity Additional ADL Goal #1: Pt will sit EOB with +2 Max A x 10 min in preparation for functional activities.  Plan Discharge plan remains appropriate    Co-evaluation                 AM-PAC PT "6 Clicks" Daily Activity     Outcome Measure   Help from another person eating meals?: Total Help from another person taking care of personal grooming?: Total Help from another person toileting, which includes using toliet, bedpan, or urinal?: Total Help from another person bathing (including washing, rinsing, drying)?: Total Help from another person to put on and taking off regular upper body clothing?: Total Help from another person to put on and taking off regular lower body clothing?: Total 6 Click Score: 6    End of Session Equipment Utilized During Treatment: (vent)  OT Visit Diagnosis: Other abnormalities of gait and mobility (R26.89);Muscle weakness (generalized) (M62.81);Low vision, both eyes (H54.2);Feeding difficulties (R63.3);Other symptoms and signs involving cognitive function;Hemiplegia and hemiparesis;Pain Hemiplegia - Right/Left: Right Hemiplegia - dominant/non-dominant: Non-Dominant Hemiplegia - caused by:  Nontraumatic intracerebral hemorrhage;Cerebral infarction Pain - part of body: Hip;Hand;Arm;Knee;Leg   Activity Tolerance Patient tolerated treatment well   Patient Left in bed;with call bell/phone within reach;with SCD's reapplied   Nurse Communication Mobility status;Other (comment)(POC)        Time: 1517-6160 OT Time Calculation (min): 36 min  Charges: OT General Charges $OT Visit: 1 Visit OT Treatments $Therapeutic Activity: 8-22 mins $Therapeutic Exercise: 8-22 mins  Maurie Boettcher, OT/L   Acute OT Clinical Specialist Acute Rehabilitation Services Pager [phone] Office [phone]    [facility] 09/04/2018, 3:52 PM

## 2018-09-04 NOTE — Progress Notes (Signed)
After the Palliative meeting family would like pt to be transferred to [facility]. MD paged several times. Waiting to hear back. Salman Wellen, Rande Brunt, RN

## 2018-09-04 NOTE — Progress Notes (Signed)
Discussed with Dr. Grandville Silos about broadening abx coverage for abd wall abscess given ongoing fevers and worsening SIRS.    P: Will d/c ceftaz Start flagyl and ceftriaxone   Kennieth Rad, AGACNP-BC [facility] Pgr: [phone] or if no answer [phone] 09/04/2018, 2:50 PM

## 2018-09-04 NOTE — Progress Notes (Signed)
NAME:  Andrea [name], MRN:  [id number], DOB:  [birth year], LOS: 21 ADMISSION DATE:  08/10/2018, CONSULTATION DATE:  08/27/2018 REFERRING MD:  Dr. Rory Percy, CHIEF COMPLAINT:  CP/ Acute CVA  Brief History   2 yoF w/poorly controlled HTN and IDDM presenting with CP and left arm pain w/BP 220/117. Initial CTH neg.  Cardiac workup negative for acute event thus far, cardiology following.  Code stroke initiated for R hemiplegia, dysarthria and right facial droop.  Out of window for TPA.  Requiring cleviprex for BP control.  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  Evolving pontine stroke on MRI. Intubated 10/10 for neuro IR s/p stenting and angioplasty with 80-90% patency.  Extubated post procedure but due to insufficency placed on BiPAP and brought to ICU.  Required reintubation and ultimately trach (10/21 JY).  Peg tube placed 10/21 by CCS.  Taken to OR 10/28 for open gastrostomy and I&D of abdominal wall abscess.    Past Medical History  HTN, IDDM, hypothyroidism s/p ablation  Significant Hospital Events   10/9 present to [facility] ED -transferred to [facility] 10/9 basilar artery stenting 10/9 intubated urgently 6h post procedure for inability to protect airway. 10/10 CTA for declining exam - evolving pontine infarct, stent deemed to be patent 10/14 Brillinta stopped and ASA/ heparin started to in anticipation of tracheostomy on Monday. 10/14 through 10/19: Neuro exam has been stable sodium increasing in spite of free water via tube, providing supportive care awaiting trach 10/20: Sodium down to 151 from 156 clinically looks the same 10/21: Sodium down to 148, sputum culture sent for purulent tracheal secretions, awaiting trach and PEG.  Purulent bronchial secretions from right upper lobe sent for BAL 10/22: Tracheostomy and PEG completed on the 21st tolerated well.  Spiking low-grade fever white cells continued to climb starting empiric vancomycin and ceftaz.  Sodium improved down to  146.  Blood pressure improving but still not at goal added hydrochlorothiazide.  Resumed Brilinta.  10/23: Sodium normal.  Backing down on free water.  Glycemic control challenging, increased basal NovoLog in addition to Lantus and sliding scale.  Still spiking fever, white blood cell count climbing, sending blood cultures.  Foley catheter being placed for pressure ulcer caused by pure wick device 10/27 Foul smelling dark trach secretions, PEG secretions  10/28 to OR for ex lap due to PEG dislodgement.  PEG replaced 10/29 remains on MV 2/2 tachypnea; family meeting 10/30 no weaning 2/2 to ongoing tachypnea; foul smelling drainage from abd site  Consults: date of consult/date signed off & final recs:  Cards 10/10 Neurology 10/10 PCCM 10/10 CCS 10/17  Procedures (surgical and bedside):  10/10 Neuro IR >> s/p stent and angioplasty of distal basilar artery  10/10 ETT for procedure 10/10 ETT (reintubated) >> removed 10/21: Size 6 tracheostomy placed by Dr. Nelda Marseille 10/26 changed to #4 trach cuffless  10/28 Ex lap with replacement of gastrostomy tube due to dislodgement  Significant Diagnostic Tests:  10/10  CTH >>  No acute intracranial abnormalities. 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 No definite acute thrombus identified. Severe stenosis left posterior cerebral artery and moderate stenosis right posterior cerebral artery. Mild stenosis left MCA and moderate stenosis left MCA bifurcation. No significant carotid or vertebral artery stenosis in the  neck. 10/10 MRI >> evolving pontine infarct. 10/11 ECHO >> normal LVSF with severe LVH. 10/28 CT A / P > dislodged PEG tube outside of the peritoneum and not within the stomach.  Cholelithiasis. No bowel obstruction.  No  hydronephrosis.  10/30 CT abd/pelvis >> Mild bilateral atelectasis.  Interval placement of new gastrostomy tube with tip and balloon within gastric lumen. Surgical drain is seen in the soft tissues in previous gastrostomy site. Small amount of gas and stranding is seen in the peritoneal fat anterior and inferior to the stomach which most likely is related to gastrostomy placement. Probable surgical wound seen in right upper quadrant of the anterior abdominal wall.  8.2 x 2.3 cm gas and fluid collection is seen in the anterior abdominal wall which is unchanged compared to prior exam. Possible abscess cannot be excluded.  Enlarged fibroid uterus.  Micro Data:  10/10 MRSA PCR >> negative 10/21 Sputum culture >> nl resp flora  10/23 Blood cultures x 2 >> negative 10/26 Blood cultures x 2 >> 10/28 MRSA PCR >> neg 10/27 Sputum culture >> normal flora  Antimicrobials:  10/10 cefazolin preop 10/22 Vancomycin >> 10/30 10/22 Ceftaz >>  Subjective:  Tmax 100.3 Remains diaphoretic and tachypneic, otherwise hemodynamically stable Ongoing purulent drainage more from JP than Gtube site with foul smell  Family meeting today with PMT  Objective   Blood pressure (!) 160/78, pulse 90, temperature 98.8 F (37.1 C), temperature source Oral, resp. rate (!) 35, height _0  (1.626 m), weight 128.1 kg, last menstrual period 07/26/2018, SpO2 99 %.    Vent Mode: PRVC FiO2 (%):  [30 %] 30 % Set Rate:  [15 bmp] 15 bmp Vt Set:  [420 mL] 420 mL PEEP:  [5 cmH20] 5 cmH20 Plateau Pressure:  [10 cmH20-19 cmH20] 19 cmH20   Intake/Output Summary (Last 24 hours) at 09/04/2018 0902 Last data filed at 09/04/2018 0800 Gross per 24 hour  Intake 2776.56 ml  Output 3155 ml  Net -378.44 ml   Filed Weights   09/02/18 0500 09/03/18 0607 09/04/18 0500  Weight: 128.1 kg 128.3 kg 128.1 kg    Examination:  General:  Acutely ill female lying in bed, diaphoretic and tachpneic on full MV support HEENT: MM  pink/moist, midline 4 cuffed trach, site wnl, minimal secretions Neuro: Eyes open, tracks, flaccid extremities CV: RR, no murmur, +1 pulses PULM: nonlabored but tachypneic on MV, coarse BS, no wheeze GI: obese, patient grimaces to palpation in upper abd, large abd dressing dry, JP and Gtube noted, foul odor present, +bs  Extremities: warm, generalized edema  Skin: no rashes   Assessment & Plan:   Respiratory failure/ hypoxemia due to inability to protect airway from cva -Expected airway difficulties with location of stroke s/p tracheostomy 10/21 P: Continue on full vent support  Not a candidate for SBT given ongoing tachypnea in the 30's likely related to her abd process/ pain Continue bronchial hygiene Intermittent CXR PCCM will continue to follow for trach care  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/p stent placement with extension of infarcts into the pontine and medullary spaces and small bilateral cerebellar infarcts   -Resulting in inadequate airway protection and quadriplegia P: Plan per Neurology/Primary Continue ASA, Brilinta per primary PMT following with ongoing palliative talks with family  Leukocytosis/ fever - likely related to abd wall abscess s/p I&D 10/29 - secretions clear and scant 10/31, trach culture negative - repeat CT abd 10/31 showing unchanged anterior abd wall fluid collection P:  Follow BC Vanc d/c'd 10/30 given neg MRSA Ongoing ceftaz, will defer to surgery, but consider broadening abx vs ID consult Repeat CBC today   Hypertension P: Continue coreg, hydralazine, norvasc, hctz per primary   Hypernatremia  P: Continue Free water 200 ml Q8   AKI - stable sCr/ UOP P: Trend BMP/ UOP  Dislodged PEG tube with associated abdominal wall absecess - s/p ex lap with I&D and replacement of PEG 10/28 - purulent drainage noted 10/31, repeat CT abd grossly unchanged in fluid collections P: Post op care per surgery  Normocytic  anemia - 7.1 on 10/30 - no obvious source of bleeding P:  Recheck CBC today  Disposition / Summary of Today's Plan 09/04/18   PCCM will continue to follow for trach/ MV management Ongoing Palliative discussions, PMT meeting with family today.  Best Practice  Diet: TFs Pain/Anxiety/Delirium protocol (if indicated): RASS goal 0  VAP protocol (if indicated): Ordered DVT prophylaxis: SCDs  GI prophylaxis: Famotidine Hyperglycemia protocol: SSI, lantus Mobility: Bedrest Code Status: Full  Family: meeting with PMT now.    CC time: 14 min  Kennieth Rad, AGACNP-BC [facility] Pgr: [phone] or if no answer [phone] 09/04/2018, 9:02 AM

## 2018-09-04 NOTE — Progress Notes (Signed)
Patient ID: Andrea [name], female   DOB: 12/25/1960, 57 y.o.   MRN: [id number] Dressing changed. Purulent drainage now from penrose site - much less around G tube, midline wound cleaner. I spoke with Dr. Leonie Man as well.  Georganna Skeans, MD, MPH, FACS Trauma: [phone] General Surgery: [phone]

## 2018-09-05 ENCOUNTER — Encounter (HOSPITAL_COMMUNITY): Admission: EM | Disposition: E | Payer: Self-pay | Source: Home / Self Care | Attending: Neurology

## 2018-09-05 ENCOUNTER — Inpatient Hospital Stay (HOSPITAL_COMMUNITY): Payer: Medicaid Other

## 2018-09-05 ENCOUNTER — Inpatient Hospital Stay (HOSPITAL_COMMUNITY): Payer: Medicaid Other | Admitting: Certified Registered Nurse Anesthetist

## 2018-09-05 DIAGNOSIS — Z7189 Other specified counseling: Secondary | ICD-10-CM

## 2018-09-05 HISTORY — PX: LAPAROTOMY: SHX154

## 2018-09-05 LAB — GLUCOSE, CAPILLARY
GLUCOSE-CAPILLARY: 127 mg/dL — AB (ref 70–99)
Glucose-Capillary: 114 mg/dL — ABNORMAL HIGH (ref 70–99)
Glucose-Capillary: 117 mg/dL — ABNORMAL HIGH (ref 70–99)
Glucose-Capillary: 84 mg/dL (ref 70–99)
Glucose-Capillary: 88 mg/dL (ref 70–99)
Glucose-Capillary: 93 mg/dL (ref 70–99)

## 2018-09-05 LAB — RENAL FUNCTION PANEL
Albumin: 1.4 g/dL — ABNORMAL LOW (ref 3.5–5.0)
Anion gap: 7 (ref 5–15)
BUN: 52 mg/dL — AB (ref 6–20)
CALCIUM: 11.1 mg/dL — AB (ref 8.9–10.3)
CO2: 23 mmol/L (ref 22–32)
Chloride: 119 mmol/L — ABNORMAL HIGH (ref 98–111)
Creatinine, Ser: 1.28 mg/dL — ABNORMAL HIGH (ref 0.44–1.00)
GFR calc Af Amer: 52 mL/min — ABNORMAL LOW (ref >60)
GFR calc non Af Amer: 45 mL/min — ABNORMAL LOW (ref >60)
GLUCOSE: 110 mg/dL — AB (ref 70–99)
Phosphorus: 2.5 mg/dL (ref 2.5–4.6)
Potassium: 3.8 mmol/L (ref 3.5–5.1)
SODIUM: 149 mmol/L — AB (ref 135–145)

## 2018-09-05 LAB — CBC
HCT: 26.7 % — ABNORMAL LOW (ref 36.0–46.0)
HEMATOCRIT: 24 % — AB (ref 36.0–46.0)
HEMOGLOBIN: 6.8 g/dL — AB (ref 12.0–15.0)
HEMOGLOBIN: 7.4 g/dL — AB (ref 12.0–15.0)
MCH: 24 pg — ABNORMAL LOW (ref 26.0–34.0)
MCH: 24.2 pg — ABNORMAL LOW (ref 26.0–34.0)
MCHC: 28.3 g/dL — AB (ref 30.0–36.0)
MCHC: 27.7 g/dL — AB (ref 30.0–36.0)
MCV: 84.8 fL (ref 80.0–100.0)
MCV: 87.3 fL (ref 80.0–100.0)
PLATELETS: 554 10*3/uL — AB (ref 150–400)
Platelets: 541 10*3/uL — ABNORMAL HIGH (ref 150–400)
RBC: 2.83 MIL/uL — AB (ref 3.87–5.11)
RBC: 3.06 MIL/uL — ABNORMAL LOW (ref 3.87–5.11)
RDW: 17.2 % — ABNORMAL HIGH (ref 11.5–15.5)
RDW: 16.9 % — ABNORMAL HIGH (ref 11.5–15.5)
WBC: 11.7 10*3/uL — ABNORMAL HIGH (ref 4.0–10.5)
WBC: 12.6 10*3/uL — AB (ref 4.0–10.5)
nRBC: 0.4 % — ABNORMAL HIGH (ref 0.0–0.2)
nRBC: 0.6 % — ABNORMAL HIGH (ref 0.0–0.2)

## 2018-09-05 LAB — PREPARE RBC (CROSSMATCH)

## 2018-09-05 LAB — MAGNESIUM: MAGNESIUM: 2.5 mg/dL — AB (ref 1.7–2.4)

## 2018-09-05 SURGERY — LAPAROTOMY, EXPLORATORY
Anesthesia: General | Site: Abdomen

## 2018-09-05 MED ORDER — ONDANSETRON HCL 4 MG/2ML IJ SOLN
INTRAMUSCULAR | Status: AC
  Filled 2018-09-05: qty 2

## 2018-09-05 MED ORDER — FREE WATER
300.0000 mL | Freq: Four times a day (QID) | Status: DC
  Administered 2018-09-05 – 2018-09-06 (×2): 300 mL

## 2018-09-05 MED ORDER — SODIUM CHLORIDE 0.9% IV SOLUTION: Freq: Once | INTRAVENOUS | Status: DC

## 2018-09-05 MED ORDER — PHENYLEPHRINE 40 MCG/ML (10ML) SYRINGE FOR IV PUSH (FOR BLOOD PRESSURE SUPPORT)
PREFILLED_SYRINGE | INTRAVENOUS | Status: DC | PRN
  Administered 2018-09-05: 80 ug via INTRAVENOUS

## 2018-09-05 MED ORDER — ROCURONIUM BROMIDE 10 MG/ML (PF) SYRINGE
PREFILLED_SYRINGE | INTRAVENOUS | Status: DC | PRN
  Administered 2018-09-05 (×3): 50 mg via INTRAVENOUS

## 2018-09-05 MED ORDER — EPHEDRINE 5 MG/ML INJ
INTRAVENOUS | Status: AC
  Filled 2018-09-05: qty 10

## 2018-09-05 MED ORDER — ONDANSETRON HCL 4 MG/2ML IJ SOLN
INTRAMUSCULAR | Status: DC | PRN
  Administered 2018-09-05: 4 mg via INTRAVENOUS

## 2018-09-05 MED ORDER — LACTATED RINGERS IV SOLN
INTRAVENOUS | Status: DC | PRN
  Administered 2018-09-05: 14:00:00 via INTRAVENOUS

## 2018-09-05 MED ORDER — ROCURONIUM BROMIDE 50 MG/5ML IV SOSY
PREFILLED_SYRINGE | INTRAVENOUS | Status: AC
  Filled 2018-09-05: qty 25

## 2018-09-05 MED ORDER — SODIUM CHLORIDE 0.9% IV SOLUTION
Freq: Once | INTRAVENOUS | Status: AC
  Administered 2018-09-05: 14:00:00 via INTRAVENOUS

## 2018-09-05 MED ORDER — FENTANYL CITRATE (PF) 100 MCG/2ML IJ SOLN
INTRAMUSCULAR | Status: DC | PRN
  Administered 2018-09-05: 50 ug via INTRAVENOUS
  Administered 2018-09-05: 100 ug via INTRAVENOUS
  Administered 2018-09-05: 50 ug via INTRAVENOUS

## 2018-09-05 MED ORDER — MIDAZOLAM HCL 5 MG/5ML IJ SOLN
INTRAMUSCULAR | Status: DC | PRN
  Administered 2018-09-05: 2 mg via INTRAVENOUS

## 2018-09-05 MED ORDER — SUCCINYLCHOLINE CHLORIDE 200 MG/10ML IV SOSY
PREFILLED_SYRINGE | INTRAVENOUS | Status: AC
  Filled 2018-09-05: qty 10

## 2018-09-05 MED ORDER — 0.9 % SODIUM CHLORIDE (POUR BTL) OPTIME
TOPICAL | Status: DC | PRN
  Administered 2018-09-05 (×2): 1000 mL

## 2018-09-05 MED ORDER — FREE WATER: 300.0000 mL | Freq: Three times a day (TID) | Status: DC

## 2018-09-05 MED ORDER — MIDAZOLAM HCL 2 MG/2ML IJ SOLN
INTRAMUSCULAR | Status: AC
  Filled 2018-09-05: qty 2

## 2018-09-05 MED ORDER — LABETALOL HCL 5 MG/ML IV SOLN
INTRAVENOUS | Status: AC
  Filled 2018-09-05: qty 4

## 2018-09-05 MED ORDER — FENTANYL CITRATE (PF) 250 MCG/5ML IJ SOLN
INTRAMUSCULAR | Status: AC
  Filled 2018-09-05: qty 5

## 2018-09-05 MED ORDER — GLYCOPYRROLATE PF 0.2 MG/ML IJ SOSY
PREFILLED_SYRINGE | INTRAMUSCULAR | Status: AC
  Filled 2018-09-05: qty 1

## 2018-09-05 MED ORDER — PROPOFOL 10 MG/ML IV BOLUS
INTRAVENOUS | Status: AC
  Filled 2018-09-05: qty 20

## 2018-09-05 SURGICAL SUPPLY — 45 items
BLADE CLIPPER SURG (BLADE) IMPLANT
BNDG GAUZE ELAST 4 BULKY (GAUZE/BANDAGES/DRESSINGS) ×2 IMPLANT
BRR ADH 5X3 SEPRAFILM 6 SHT (MISCELLANEOUS)
CANISTER SUCT 3000ML PPV (MISCELLANEOUS) ×3 IMPLANT
CHLORAPREP W/TINT 26ML (MISCELLANEOUS) ×3 IMPLANT
COVER SURGICAL LIGHT HANDLE (MISCELLANEOUS) ×3 IMPLANT
COVER WAND RF STERILE (DRAPES) ×1 IMPLANT
DRAPE LAPAROSCOPIC ABDOMINAL (DRAPES) ×3 IMPLANT
DRAPE WARM FLUID 44X44 (DRAPE) ×3 IMPLANT
DRSG OPSITE POSTOP 4X10 (GAUZE/BANDAGES/DRESSINGS) IMPLANT
DRSG OPSITE POSTOP 4X8 (GAUZE/BANDAGES/DRESSINGS) IMPLANT
ELECT BLADE 6.5 EXT (BLADE) IMPLANT
ELECT CAUTERY BLADE 6.4 (BLADE) ×3 IMPLANT
ELECT REM PT RETURN 9FT ADLT (ELECTROSURGICAL) ×3
ELECTRODE REM PT RTRN 9FT ADLT (ELECTROSURGICAL) ×1 IMPLANT
GAUZE PACKING IODOFORM 1X5 (MISCELLANEOUS) ×2 IMPLANT
GAUZE SPONGE 4X4 12PLY STRL (GAUZE/BANDAGES/DRESSINGS) ×2 IMPLANT
GLOVE BIOGEL PI IND STRL 8 (GLOVE) ×1 IMPLANT
GLOVE BIOGEL PI INDICATOR 8 (GLOVE) ×2
GLOVE ECLIPSE 7.5 STRL STRAW (GLOVE) ×3 IMPLANT
GOWN STRL REUS W/ TWL LRG LVL3 (GOWN DISPOSABLE) ×2 IMPLANT
GOWN STRL REUS W/TWL LRG LVL3 (GOWN DISPOSABLE) ×6
KIT BASIN OR (CUSTOM PROCEDURE TRAY) ×3 IMPLANT
KIT TURNOVER KIT B (KITS) ×3 IMPLANT
LIGASURE IMPACT 36 18CM CVD LR (INSTRUMENTS) IMPLANT
NS IRRIG 1000ML POUR BTL (IV SOLUTION) ×6 IMPLANT
PACK GENERAL/GYN (CUSTOM PROCEDURE TRAY) ×3 IMPLANT
PAD ABD 8X10 STRL (GAUZE/BANDAGES/DRESSINGS) ×2 IMPLANT
PAD ARMBOARD 7.5X6 YLW CONV (MISCELLANEOUS) ×3 IMPLANT
SEPRAFILM PROCEDURAL PACK 3X5 (MISCELLANEOUS) IMPLANT
SPECIMEN JAR LARGE (MISCELLANEOUS) IMPLANT
SPONGE LAP 18X18 X RAY DECT (DISPOSABLE) IMPLANT
STAPLER VISISTAT 35W (STAPLE) ×3 IMPLANT
SUCTION POOLE TIP (SUCTIONS) ×3 IMPLANT
SUT ETHILON 2 0 FS 18 (SUTURE) ×2 IMPLANT
SUT NOVA 1 T20/GS 25DT (SUTURE) ×4 IMPLANT
SUT PDS AB 1 TP1 96 (SUTURE) ×2 IMPLANT
SUT SILK 2 0 SH CR/8 (SUTURE) ×3 IMPLANT
SUT SILK 2 0 TIES 10X30 (SUTURE) ×3 IMPLANT
SUT SILK 3 0 SH CR/8 (SUTURE) ×3 IMPLANT
SUT SILK 3 0 TIES 10X30 (SUTURE) ×3 IMPLANT
SYRINGE TOOMEY DISP (SYRINGE) ×2 IMPLANT
TOWEL OR 17X26 10 PK STRL BLUE (TOWEL DISPOSABLE) ×3 IMPLANT
TRAY FOLEY MTR SLVR 16FR STAT (SET/KITS/TRAYS/PACK) IMPLANT
YANKAUER SUCT BULB TIP NO VENT (SUCTIONS) IMPLANT

## 2018-09-05 NOTE — Progress Notes (Addendum)
NAME:  Andrea [name], MRN:  [id number], DOB:  Jul 15, 1961, LOS: 37 ADMISSION DATE:  08/31/2018, CONSULTATION DATE:  08/27/2018 REFERRING MD:  Dr. Rory Percy, CHIEF COMPLAINT:  CP/ Acute CVA  Brief History   89 yoF w/poorly controlled HTN and IDDM presenting with CP and left arm pain w/BP 220/117. Initial CTH neg.  Cardiac workup negative for acute event thus far, cardiology following.  Code stroke initiated for R hemiplegia, dysarthria and right facial droop.  Out of window for TPA.  Requiring cleviprex for BP control.  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  Evolving pontine stroke on MRI. Intubated 10/10 for neuro IR s/p stenting and angioplasty with 80-90% patency.  Extubated post procedure but due to insufficency placed on BiPAP and brought to ICU.  Required reintubation and ultimately trach (10/21 JY).  Peg tube placed 10/21 by CCS.  Taken to OR 10/28 for open gastrostomy and I&D of abdominal wall abscess.    Past Medical History  HTN, IDDM, hypothyroidism s/p ablation  Significant Hospital Events   10/9 present to [facility] ED -transferred to [facility] 10/9 basilar artery stenting 10/9 intubated urgently 6h post procedure for inability to protect airway. 10/10 CTA for declining exam - evolving pontine infarct, stent deemed to be patent 10/14 Brillinta stopped and ASA/ heparin started to in anticipation of tracheostomy on Monday. 10/14 through 10/19: Neuro exam has been stable sodium increasing in spite of free water via tube, providing supportive care awaiting trach 10/20: Sodium down to 151 from 156 clinically looks the same 10/21: Sodium down to 148, sputum culture sent for purulent tracheal secretions, awaiting trach and PEG.  Purulent bronchial secretions from right upper lobe sent for BAL 10/22: Tracheostomy and PEG completed on the 21st tolerated well.  Spiking low-grade fever white cells continued to climb starting empiric vancomycin and ceftaz.  Sodium improved down to  146.  Blood pressure improving but still not at goal added hydrochlorothiazide.  Resumed Brilinta.  10/23: Sodium normal.  Backing down on free water.  Glycemic control challenging, increased basal NovoLog in addition to Lantus and sliding scale.  Still spiking fever, white blood cell count climbing, sending blood cultures.  Foley catheter being placed for pressure ulcer caused by pure wick device 10/27 Foul smelling dark trach secretions, PEG secretions  10/28 to OR for ex lap due to PEG dislodgement.  PEG replaced 10/29 remains on MV 2/2 tachypnea; family meeting 10/30 no weaning 2/2 to ongoing tachypnea; foul smelling drainage from abd site 10/31 no weaning, abx changed, family meeting  Consults: date of consult/date signed off & final recs:  Cards 10/10 Neurology 10/10 PCCM 10/10 CCS 10/17  Procedures (surgical and bedside):  10/10 Neuro IR >> s/p stent and angioplasty of distal basilar artery  10/10 ETT for procedure 10/10 ETT (reintubated) >> removed 10/21: Size 6 tracheostomy placed by Dr. Nelda Marseille 10/26 changed to #4 trach cuffless  10/28 Ex lap with replacement of gastrostomy tube due to dislodgement  Significant Diagnostic Tests:  10/10  CTH >>  No acute intracranial abnormalities. 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 No definite acute thrombus identified. Severe stenosis left posterior cerebral artery and moderate stenosis right posterior cerebral artery. Mild stenosis left MCA and moderate stenosis left MCA bifurcation. No significant  carotid or vertebral artery stenosis in the neck. 10/10 MRI >> evolving pontine infarct. 10/11 ECHO >> normal LVSF with severe LVH. 10/28 CT A / P > dislodged PEG tube outside of the peritoneum and not within the stomach.   Cholelithiasis. No bowel obstruction.  No hydronephrosis.  10/30 CT abd/pelvis >> Mild bilateral atelectasis.  Interval placement of new gastrostomy tube with tip and balloon within gastric lumen. Surgical drain is seen in the soft tissues in previous gastrostomy site. Small amount of gas and stranding is seen in the peritoneal fat anterior and inferior to the stomach which most likely is related to gastrostomy placement. Probable surgical wound seen in right upper quadrant of the anterior abdominal wall.  8.2 x 2.3 cm gas and fluid collection is seen in the anterior abdominal wall which is unchanged compared to prior exam. Possible abscess cannot be excluded.  Enlarged fibroid uterus.  Micro Data:  10/10 MRSA PCR >> negative 10/21 Sputum culture >> nl resp flora  10/23 Blood cultures x 2 >> negative 10/26 Blood cultures x 2 >> neg 10/28 MRSA PCR >> neg 10/27 Sputum culture >> normal flora  Antimicrobials:  10/10 cefazolin preop 10/22 Vancomycin >> 10/30 10/22 Ceftaz >> 10/31 10/31 ceftriaxone >> 10/31 flagyl >>  Subjective:  Tmax 101.1 Hgb 6.8 RN reports sister at bedside did not want patient to receive pain medication overnight because it was too strong No vent issues  Objective   Blood pressure (!) 157/75, pulse 89, temperature (!) 101.1 F (38.4 C), temperature source Axillary, resp. rate (!) 31, height _0  (1.626 m), weight 127.6 kg, last menstrual period 07/26/2018, SpO2 99 %.    Vent Mode: PRVC FiO2 (%):  [30 %] 30 % Set Rate:  [15 bmp] 15 bmp Vt Set:  [420 mL] 420 mL PEEP:  [5 cmH20] 5 cmH20 Plateau Pressure:  [11 cmH20-19 cmH20] 19 cmH20   Intake/Output Summary (Last 24 hours) at 09/06/2018 0851 Last data filed at 09/10/2018 0800 Gross per 24 hour  Intake 3904.4 ml  Output 2615 ml  Net 1289.4 ml   Filed Weights   09/03/18 0607 09/04/18 0500 09/30/2018 0500  Weight: 128.3 kg 128.1 kg 127.6 kg    Examination:  General:  Ill appearing female in bed, less  diaphoretic today HEENT: MM pink/moist, midline cuffed trach- site wnl, unable to turn head Neuro: Eyes open, tracks, flaccid extremities CV: ST 106, no murmur, strong distal pulses PULM: coarse bilaterally, remains tachypneic around 30 on full MV, minimal secretions, placed on PS 12/5 - she was only able to tolerate for < 69mns prior to destaturing, apneic and bradycardia, quickly resolved once placed on full support GI: obese, abd binder in place, +bs, large dressing to abd- less purulent/ foul smelling drainage around gtube/ penrose drain  Extremities: warm/dry, +1-2 BLE edema  Skin: no rashes   Assessment & Plan:   Respiratory failure/ hypoxemia due to inability to protect airway from cva -Expected airway difficulties with location of stroke s/p tracheostomy 10/21 - 11/1 CXR reviewed- stable trach, improving RLL atelectasis  P: Continue on full vent support  No SBT trials for now- did not tolerate on my exam, consider once current abd infection resolves Continue bronchial hygiene Intermittent CXR Appreciate/ agree with ongoing palliative care discussions  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/p stent placement with extension of infarcts into the pontine and medullary spaces and small bilateral cerebellar infarcts   -Resulting in inadequate airway protection and quadriplegia P: Plan  per Neurology/Primary Continue ASA, Brilinta per primary   Abd wall abscess  - s/p I&D 10/29 by CCS P: BC/ sputum cx neg thus far Ceftriaxone and flagyl started 10/31  Hypertension P: Continue coreg, hydralazine, norvasc, hctz per primary   Hypernatremia  P: Increase Free water 300 ml Q8   AKI - stable sCr/ UOP, improving P: Trend BMP/ UOP Remove foley, place purwick, monitor for rentention and skin break down  Dislodged PEG tube with associated abdominal wall absecess - s/p ex lap with I&D and replacement of PEG 10/28 - purulent drainage noted 10/31, repeat  CT abd grossly unchanged in fluid collections P: Post op care per surgery  Normocytic anemia - Hgb 7 -> 6.8 - no obvious source of bleeding P:  1 unit of PRBC today Trend CBC, transfuse for Hgb >7  Hypercalcemia  - Ca corrects to 13.2 P:  Check iCa  Disposition / Summary of Today's Plan 09/30/2018   Continue full MV support, no weaning trials for now.  Consider when current infection has resolved. Transfuse 1 unit of PRBC  Best Practice  Diet: TFs Pain/Anxiety/Delirium protocol (if indicated): RASS goal 0  VAP protocol (if indicated): Ordered DVT prophylaxis: SCDs  GI prophylaxis: Famotidine Hyperglycemia protocol: SSI, lantus Mobility: Bedrest Code Status: Full  Family: ongoing with PMT   CCT [place], AGACNP-BC [place] [facility] Pgr: [phone] or if no answer [phone] 09/14/2018, 8:51 AM

## 2018-09-05 NOTE — Anesthesia Procedure Notes (Signed)
Arterial Line Insertion Start/End11/01/2018 2:10 PM, 09/05/2018 2:17 PM Performed by: Mariea Clonts, CRNA, CRNA  Patient location: OR. Preanesthetic checklist: patient identified, pre-op evaluation and timeout performed Right, radial was placed Catheter size: 20 G Hand hygiene performed  and maximum sterile barriers used   Attempts: 2 Procedure performed without using ultrasound guided technique. Following insertion, dressing applied and Biopatch. Post procedure assessment: normal and unchanged  Patient tolerated the procedure well with no immediate complications.

## 2018-09-05 NOTE — Anesthesia Preprocedure Evaluation (Addendum)
Anesthesia Evaluation  Patient identified by MRN, date of birth, ID band Patient unresponsive    Reviewed: Allergy & Precautions, NPO status , Patient's Chart, lab work & pertinent test results  Airway Mallampati: [place]  (+) Teeth Intact, Dental Advisory Given   Pulmonary  VDRF   Pulmonary exam normal breath sounds clear to auscultation       Cardiovascular hypertension, Pt. on medications Normal cardiovascular exam Rhythm:Regular Rate:Normal     Neuro/Psych  Headaches, Anxiety Depression S/P Pontine stroke10/08/2018 CVA, Residual Symptoms    GI/Hepatic   Endo/Other  diabetes, Type 2, Oral Hypoglycemic Agents  Renal/GU Renal InsufficiencyRenal disease     Musculoskeletal   Abdominal   Peds  Hematology  (+) Blood dyscrasia, anemia ,   Anesthesia Other Findings   Reproductive/Obstetrics                             Anesthesia Physical  Anesthesia Plan  ASA: IV  Anesthesia Plan: General   Post-op Pain Management:    Induction:   PONV Risk Score and Plan: 3 and Ondansetron and Treatment may vary due to age or medical condition  Airway Management Planned: Tracheostomy  Additional Equipment: Arterial line and CVP  Intra-op Plan:   Post-operative Plan: Post-operative intubation/ventilation  Informed Consent: I have reviewed the patients History and Physical, chart, labs and discussed the procedure including the risks, benefits and alternatives for the proposed anesthesia with the patient or authorized representative who has indicated his/her understanding and acceptance.   Dental advisory given  Plan Discussed with: CRNA and Surgeon  Anesthesia Plan Comments:        Anesthesia Quick Evaluation

## 2018-09-05 NOTE — Progress Notes (Signed)
CPT held patient was in surgery

## 2018-09-05 NOTE — Progress Notes (Addendum)
PCCM:  I spoke with Dr. Odis Luster, neurology from [facility], along with Dr. Leonie Man earlier this morning. Dr. Odis Luster has discussed with his his team and they feel there is nothing more from a neurologic perspective they can offer.   Therefore, they will decline acceptance for transfer.   Garner Nash, DO [place] [facility] 09/12/2018 1:24 PM  Personal pager: [phone] If unanswered, please page CCM On-call: [phone]   I informed Dr. Leonie Man of the call from transfer center and info above.  I attempted to inform the family but they were no longer present on the floor.   Garner Nash, DO [place] [facility] 09/11/2018 1:26 PM  Personal pager: [phone] If unanswered, please page CCM On-call: [phone]

## 2018-09-05 NOTE — Op Note (Signed)
OPERATIVE REPORT  DATE OF OPERATION: 09/18/2018  PATIENT:  Andrea [name]  57 y.o. female  PRE-OPERATIVE DIAGNOSIS:  feculent drainage  POST-OPERATIVE DIAGNOSIS:  Necrotic abdominal wall abscess with some midline abdominal wall fascia  INDICATION(S) FOR OPERATION:  Necrotic drainage from midline wound  FINDINGS:  Feculent, necrotic abdominal wll abscess in the left mid to lower abdomen without fistulization  PROCEDURE:  Procedure(s): EXPLORATORY LAPAROTOMY INCISION AND DRAINAGE AND DEBRIDEMENT OF ABDOMINAL WALL ABSCESS  SURGEON:  Surgeon(s): Judeth Horn, MD  ASSISTANT: None  ANESTHESIA:   general  COMPLICATIONS:  None  EBL: None ml  BLOOD ADMINISTERED: 350 CC PRBC  DRAINS: Kerlix in left lwer abdominal wall abcess and 1" iodoform NuGauze in the Upper mid abdominal would.  Gastrostomy tube in place and flushes well   SPECIMEN:  Source of Specimen:  Wound aerobic and anaerobic microbiology  COUNTS CORRECT:  YES  PROCEDURE DETAILS: The patient was taken to the operating room directly from the intensive care unit and placed on the table in supine position.  She had a tracheostomy in place, then she was placed on the N-acetylcysteine and subsequently prepped and draped in sterile manner.  A proper timeout was performed identifying the patient and the procedure performed.  Patient had a rockhard abdominal wall abscess in the left lower quadrant which was open using a #10 blade directly into a large fluid-filled, feculent smelling cavity.  Large amount of necrotic debris and fluid was aspirated and pulled from the cavity which was subsequently cleansed with saline and then packed with Betadine soaked Kerlix gauze.  This connected to the upper midline wound which had been packed with a Penrose drain for this and then adequately drained.  Subsequently opened and larger and then once we cleaned it out packed with about three quarters of a bottle of 1 inch iodoform gauze.  The  gastrostomy tube was in place we confirmed that with direct observation at the lower abdominal wall.  We did see that there was no feculent debris or any type of enteric contents coming from the intra-abdominal portion of the abdomen.  We did take out some of the fascial sutures and then we closed it using interrupted #1 Novafil sutures.  We packed the midline wound using Betadine soaked Kerlix also.  We confirmed position of the G-tube by irrigating with saline using a [place] syringe.  There is excellent flow into the stomach and no evidence of leakage.  All needle counts, sponge counts, and instrument counts were correct.  PATIENT DISPOSITION:  Directly back to the ICU in stable  condition on the ventilator   Judeth Horn 11/1/20193:27 PM

## 2018-09-05 NOTE — Anesthesia Procedure Notes (Signed)
Date/Time: 09/07/2018 2:02 PM Performed by: Carney Living, CRNA Pre-anesthesia Checklist: Patient identified, Emergency Drugs available, Suction available, Patient being monitored and Timeout performed Patient Re-evaluated:Patient Re-evaluated prior to induction Induction Type: Inhalational induction and Tracheostomy Placement Confirmation: positive ETCO2 and breath sounds checked- equal and bilateral Comments: Pt connected to existing #6 [place] cuffed trach, + ETC02, BBS=, VSS

## 2018-09-05 NOTE — Anesthesia Postprocedure Evaluation (Signed)
Anesthesia Post Note  Patient: Andrea [name]  Procedure(s) Performed: EXPLORATORY LAPAROTOMY (N/A Abdomen)     Patient location during evaluation: ICU Anesthesia Type: General Level of consciousness: sedated Pain management: pain level controlled Vital Signs Assessment: post-procedure vital signs reviewed and stable Respiratory status: patient on ventilator - see flowsheet for VS (via tracheostomy) Cardiovascular status: stable Postop Assessment: no apparent nausea or vomiting Anesthetic complications: no    Last Vitals:  Vitals:   09/23/2018 1556 09/12/2018 1700  BP:  (!) 153/86  Pulse:  92  Resp:  (!) 28  Temp: 36.8 C   SpO2: 94% 95%    Last Pain:  Vitals:   09/27/2018 1556  TempSrc: Axillary  PainSc:                  [name]

## 2018-09-05 NOTE — Anesthesia Procedure Notes (Signed)
Central Venous Catheter Insertion Performed by: Roderic Palau, MD, anesthesiologist Start/End11/14/2019 2:06 PM, 09/05/2018 2:16 PM Patient location: Pre-op. Preanesthetic checklist: patient identified, IV checked, site marked, risks and benefits discussed, surgical consent, monitors and equipment checked, pre-op evaluation, timeout performed and anesthesia consent Position: Trendelenburg Lidocaine 1% used for infiltration and patient sedated Hand hygiene performed , maximum sterile barriers used  and Seldinger technique used Catheter size: 8 Fr Total catheter length 16. Central line was placed.Double lumen Procedure performed without using ultrasound guided technique. Attempts: 1 Following insertion, dressing applied, line sutured and Biopatch. Post procedure assessment: blood return through all ports  Patient tolerated the procedure well with no immediate complications.

## 2018-09-05 NOTE — Progress Notes (Signed)
SLP Cancellation Note  Patient Details Name: Andrea [name] MRN: [id number] DOB: [birth year]   Cancelled treatment:       Reason Eval/Treat Not Completed: Patient not medically ready/ Following along for ability to help with functional communication. Pt quite tachypneic now, Will need to be a little more stable for attempts with in line PMSV with full vent support, but I think it may be possible in the near future. Will keep checking for increased stability.     [name], [name] 10/03/2018, 11:15 AM

## 2018-09-05 NOTE — Progress Notes (Signed)
STROKE TEAM PROGRESS NOTE   SUBJECTIVE (INTERVAL HISTORY) Pt  remains neurologically stable . She continues to have discharge with a foul order around the G-tube site. Trauma team plans to reexplore. I met with patient's sister Andrea [name] at the bedside and she requested transfer to [facility]. I spoke to Dr. Kristie Cowman from neurology from [facility] transfer center. He stated that he would get back to [place] after discussion the neuro critical care and medical critical care teams. He did call back later on in the afternoon declining the transfer OBJECTIVE Vitals:   09/06/2018 1100 09/10/2018 1142 09/26/2018 1335 09/08/2018 1345  BP: (!) 141/73  (!) 161/82 (!) 161/82  Pulse: 88  92 97  Resp: (!) 32  (!) 30 (!) 32  Temp:  (!) 101.6 F (38.7 C) 99.6 F (37.6 C)   TempSrc:  Axillary Oral   SpO2: 100% 100% 99% 100%  Weight:      Height:        CBC:  Recent Labs  Lab 09/04/18 1106 09/15/2018 0613  WBC 14.8* 11.7*  HGB 7.0* 6.8*  HCT 24.8* 24.0*  MCV 84.1 84.8  PLT 523* 541*    Basic Metabolic Panel:  Recent Labs  Lab 09/04/18 0753 09/14/2018 0613  NA 146* 149*  K 4.0 3.8  CL 118* 119*  CO2 22 23  GLUCOSE 118* 110*  BUN 56* 52*  CREATININE 1.31* 1.28*  CALCIUM 11.3* 11.1*  MG  --  2.5*  PHOS  --  2.5    Lipid Panel:     Component Value Date/Time   CHOL 256 (H) 08/15/2018 0512   TRIG 248 (H) 08/15/2018 0512   HDL 35 (L) 08/15/2018 0512   CHOLHDL 7.3 08/15/2018 0512   VLDL 50 (H) 08/15/2018 0512   LDLCALC 171 (H) 08/15/2018 0512   HgbA1c:  Lab Results  Component Value Date   HGBA1C 11.4 (H) 08/15/2018   Urine Drug Screen:     Component Value Date/Time   LABOPIA NONE DETECTED 08/10/2018 1836   COCAINSCRNUR NONE DETECTED 08/26/2018 1836   LABBENZ NONE DETECTED 08/05/2018 1836   AMPHETMU NONE DETECTED 08/26/2018 1836   THCU NONE DETECTED 08/06/2018 1836   LABBARB NONE DETECTED 08/31/2018 1836    Alcohol Level No results found for: ETH  IMAGING  Ct Angio Head W Or Wo  Contrast Ct Angio Neck W Or Wo Contrast 08/15/2018 IMPRESSION:  1. Interval basilar to left proximal PCA stenting. The density of the stent walls precludes detection of in stent stenosis; there is a degree of wasting at the mid basilar segment. There is flow in the left PCA beyond the stent such that there is presumed stent patency.  2. Known lower pontine infarct. There are new small cerebellar infarcts since brain MRI yesterday.  3. Severe left P2 segment stenosis, also seen previously.  4. Moderate atheromatous narrowing in the proximal left MCA.    Ct Angio Head W Or Wo Contrast Ct Angio Neck W Or Wo Contrast 08/22/2018 IMPRESSION:  1. 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 No definite acute thrombus identified.  2. Severe stenosis left posterior cerebral artery and moderate stenosis right posterior cerebral artery.  3. Mild stenosis left MCA and moderate stenosis left MCA bifurcation.  4. No significant carotid or vertebral artery stenosis in the neck.    Ct Head Wo Contrast 08/24/2018 IMPRESSION:  No acute intracranial abnormalities. Mild white matter changes likely due to small  vessel ischemia.    Mr Brain Wo Contrast 08/05/2018 IMPRESSION:  1. Acute nonhemorrhagic infarct involving the left paramedian brainstem. The infarct crosses midline.  2. Other periventricular and subcortical white matter disease is moderately advanced for age. This likely reflects the sequela of chronic microvascular ischemia.  3. Tapering of the dens with prominent soft tissue pannus. 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    Ct Head Code Stroke Wo Contrast 08/24/2018  IMPRESSION:  1. No acute abnormality and no change from earlier today  2. ASPECTS is 10 3.    Cerebral Angiogram 08/24/2018 S/P 4 vessel cerebral arteriogram RT CFA approach. Findings  1.Severe stenosis of distal basilar artery 90 % ,associated with  mod to severe ASVD of the mid basilar artery and Lt ANt cerebellar A. S/P stent assisted angioplasty of distal basilar artery with patency of 80 to 90 %. 2.Approx 70 % stenosis of LT ICA supraclinoid seg   MRI 08/15/18 1. Progressive acute pontine infarct. New patchy bilateral cerebellar infarction. New tiny left thalamic infarcts. 2. Preserved flow void in the stented basilar. 3. left facial/submandibular edema, please correlate with neck exam. Edited result: IMPRESSION: 1. Progressive acute pontine infarct. New patchy bilateral cerebellar infarction. New tiny left thalamic infarcts. 2. Preserved flow void in the stented basilar.  TTE - Left ventricle: The cavity size was normal. There was severe   concentric hypertrophy. Systolic function was normal. The   estimated ejection fraction was in the range of 60% to 65%. Wall   motion was normal; there were no regional wall motion   abnormalities. Doppler parameters are consistent with abnormal   left ventricular relaxation (grade 1 diastolic dysfunction). The   E/e&' ratio is <8, suggesting normal LV filling pressure. - Aortic valve: Trileaflet; mildly calcified leaflets.   Transvalvular velocity was minimally increased. There was no   regurgitation. Mean gradient (S): 12 mm Hg. - Mitral valve: Mildly thickened leaflets . There was trivial   regurgitation. - Left atrium: The atrium was normal in size. - Inferior vena cava: The vessel was dilated. The respirophasic   diameter changes were blunted (< 50%), consistent with elevated   central venous pressure. Impressions: - LVEF 60-65%, severe LVH, normal wall motion, grade 1 DD, normal   LV filling pressure, minimally increased aortic velocity without   signficant stenosis, trivial MR, normal LA size, dilated IVC.  Dg Chest Port 1 View  Result Date: 08/23/2018 CLINICAL DATA:  Encounter for fever. EXAM: PORTABLE CHEST 1 VIEW COMPARISON:  08/19/2018 FINDINGS: Endotracheal tube is 3.6  cm above the carina. Airspace densities in the right hilum and right lower lung have markedly decreased and possibly resolved. There may be a small amount of residual disease in the medial right lower lung. Vascular crowding in the hilar regions. Low lung volumes. Heart size is grossly stable and within normal limits. Nasogastric tube extends into the abdomen. Negative for pneumothorax. IMPRESSION: Airspace disease in the right lung has markedly decreased. Low lung volumes. Support apparatuses as described. Electronically Signed   By: Markus Daft M.D.   On: 08/23/2018 11:07    DG Chest 1 View 08/30/2018 IMPRESSION: Cardiomegaly and mild pulmonary venous congestion/edema. No other change.   PHYSICAL EXAM  Temp:  [98.5 F (36.9 C)-101.6 F (38.7 C)] 99.6 F (37.6 C) (11/01 1335) Pulse Rate:  [85-106] 97 (11/01 1345) Resp:  [11-35] 32 (11/01 1345) BP: (106-164)/(66-84) 161/82 (11/01 1345) SpO2:  [97 %-100 %] 100 % (11/01 1345) FiO2 (%):  [30 %] 30 % (  11/01 1142) Weight:  [127.6 kg] 127.6 kg (11/01 0500)  General -  Obese middle-aged African-American lady, in distress. She has abdominal surgical wound and drainage from the abdominal wall with a foul odor Ophthalmologic - fundi not visualized due to noncooperation.  Cardiovascular - not tachycardic currently in the 80s RRR  Neuro - post trach, on trach collar, off sedation. Eyes open, awake, alert. No ptosis or EOMI, denies diplopia.  PERRL. blinking to visual threat bilaterally. Right gaze nystagmus   improved, can follow and track examiner across the room. Bilateral facial weakness, R>L, corneal, gag and cough present. Able to open mouth slightly but not able to protrude tongue. Quadriplegia, with left LE intermittent  flicker, proximal and 0/5 distal, grimaces to pain in the right UE. DTR diminished throughout. Coordination and gait not tested.   ASSESSMENT/PLAN [name] is a 57 y.o. female with history of difficult to control  hypertension, insulin-dependent diabetes, obesity, hypothyroidism status post ablation presenting with chest discomfort, right-sided weakness, slurred speech and right facial droop. She did not receive IV t-PA due to late presentation. S/P stent assisted angioplasty of distal basilar artery.  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 Worsening symptoms with extension of pontine/medullary infarcts with new small bilateral cerebellar infarcts without evidence of stent re-stenosis or occlusion  Resultant quadriplegia, on trach and peg  CT head - No acute intracranial abnormalities.  CTA H&N 08/21/2018 - Extensive atherosclerotic disease in the basilar with focal critical stenosis in the mid to distal basilar. No definite acute thrombus identified. Severe stenosis left posterior cerebral artery  MRI head - Acute nonhemorrhagic infarct involving the left paramedian brainstem.   CTA H&N 08/15/2018 - stent too dense to see BA lumen, but distal flow preserved. New small cerebellar infarcts since brain MRI yesterday.  Severe left P2 segment stenosis, also seen previously.   Repeat MRI - extension of b/l pontine and upper medullary infarcts with new b/l cerebellar infarcts.  2D Echo - EF 60-65%  LDL - 174  HgbA1c - 11.1  VTE prophylaxis - heparin subq  aspirin 81 mg daily prior to admission, now resumed on Brilinta 90 mg bid and ASA 81 mg daily.   Patient counseled to be compliant with her antithrombotic medications  Ongoing aggressive stroke risk factor management  Therapy recommendations:  LTACH  Disposition:  Pending  Intracranial stenosis  BA mid to distal severe stenosis s/p BA stenting  Left PCA severe stenosis, R PCA moderate stenosis  Left MCA moderate stenosis  Uncontrolled stroke risk factors with HLD, HTN, DM  Non compliance with meds at home  Respiratory failure  S/p trach 08/22/2018  On trach collar now, tolerating  well  Still has secretions, improved  CCM following with trach intermittently  Hypertension  Stable  BP goal 130-150   On po norvasc, losartan  Metoprolol changed to Coreg 25 bid, also on HCTZ 48m now  Also on low dose hydralazine 293mtid  Hyperlipidemia  Lipid lowering medication PTA:  Lipitor 20 mg daily  LDL 174, goal < 70  Increase to 80 mg daily  Continue statin at discharge  Diabetes  HgbA1c 11.4, goal < 7.0  Uncontrolled  Hyperglycemia  Lantus increase from 15U bid to 18U bid   Continue basal NovoLog increase from 4U Q4h to 6U Q4h  SSI  CBG monitoring - glucose 272 -> Diabetic nurse consult  Infection no doubt contributing to elevated glucose.  Low grade fever with  leukocytosis  Tmax 100.7 -> afebrile->100.7->101-> afebrile   Leukocytosis - 12.7->12.9->14.4->14.3->16.1->20.6->19.4->19.0 -> 20.8->19.7  UA neg for UTI  CXR unremarkable, improving from prior  Sputum culture normal flora  On vanco and fortaz for empiric treatment     U/A abnormal - culture pending ; Blood cultures negative 10/26 ; CXR  Not C/W pneumonia  Respiratory cultures - mixed flora    Abdominal wall abscess -s/p open surgical drainage 08/26/2018 persistent foul odor discharge. For reexploration 10/02/2018    Hypernatremia and elevated Cre/BUN  Na 156->156->151->148->146->141->137->139  Cre 1.41->1.40->1.22->1.16->1.22->1.18->1.06->1.12-> 1.19-> 1.27  Put on NS @ 50  On TF @ 45  Add free water 220m Q8h  Continue BMP monitoring  Dysphagia s/p PEG  Continue tube feeding 45 cc/h  PEG done 08/22/2018  Anemia   Hb 11.1->9.7->9.4->9.3->9.4->8.8->8.2->7.9->7.7->7.0 -> 8.8-> 8.7  Likely due to iron deficiency and acute uterine bleeding  Iron panel showed iron deficiency  Put on iron pills  PRBC transfusion 2 U Friday 08/29/2018  CBC monitoring  Abnormal uterine bleeding  Has been following with GYN  Was on megace in the past  Will resume megace  852mbid  Continue ASA and brilinta for now  Vaginal bleeding much improved as per RN  Close CBC monitoring  Other Stroke Risk Factors  Obesity, Body mass index is 48.29 kg/m., recommend weight loss, diet and exercise as appropriate   Other Active Problems  Hypokalemia - on supplement for K 3.3->4.2 -> 4.5-> 3.8  Plts - 489 -> 553-> [facility]   Antibiotics have been changed to Flagyl and ceftriaxone as per ID.. Marland Kitcheniscussed with Dr. ThGrandville Silosnd Dr. CoLinus Salmonsrom infectious disease Follow-up of medical issues as per critical care team. Patient's prognosis is quite poor and given her significant multiple medical problems chances of her surviving and making meaningful clinical recovery are quite bleak. I had a family meeting today with multiple to discuss goals of care and recommend palliative care consult. Discussed with Dr. IcValeta Harmsritical care medicine  .. I spoke to the patient's sister FrSiri Colet the bedside and informed her [facility]. [facility] which was also declined.  This patient is critically ill due to worsening brainstem infarct, BA stenosis s/p stenting, hyperglycemia, currently febrile and tachypnic requiring frequent suctioning, elevated WBCs,  +leukocutes in urine, hypertensive,  anemia and hypernatremia and at significant risk of neurological worsening, death form recurrent stroke, hemorrhagic conversion, CHF, seizure, aspiration, hemorrhagic shock.   This patient is critically ill and at significant risk of neurological worsening, death and care requires constant monitoring of vital signs, hemodynamics,respiratory and cardiac monitoring,review of multiple databases, neurological assessment, discussion with family, other specialists and medical decision making of high complexity.I  I spent 45 minutes of neurocritical care time in the care of  this patient.  PrAntony ContrasD      To contact Stroke Continuity provider, please refer to [email or web address] hours, contact General Neurology

## 2018-09-05 NOTE — Transfer of Care (Signed)
Immediate Anesthesia Transfer of Care Note  Patient: Andrea [name]  Procedure(s) Performed: EXPLORATORY LAPAROTOMY (N/A Abdomen)  Patient Location: SICU  Anesthesia Type:General  Level of Consciousness: Patient remains intubated per anesthesia plan  Airway & Oxygen Therapy: Patient remains intubated per anesthesia plan and Patient placed on Ventilator (see vital sign flow sheet for setting)  Post-op Assessment: Report given to RN and Post -op Vital signs reviewed and stable  Post vital signs: Reviewed and stable  Last Vitals:  Vitals Value Taken Time  BP 136/76 09/18/2018  3:49 PM  Temp    Pulse 86 09/30/2018  3:56 PM  Resp 22 09/21/2018  3:56 PM  SpO2 94 % 09/30/2018  3:56 PM  Vitals shown include unvalidated device data.  Last Pain:  Vitals:   09/21/2018 1335  TempSrc: Oral  PainSc:       Patients Stated Pain Goal: 0 (18/29/93 7169)  Complications: No apparent anesthesia complications

## 2018-09-05 NOTE — Progress Notes (Signed)
[facility] Progress Note  4 Days Post-Op  Subjective: CC: wound infection  Patient with feculent drainage from midline and penrose site, also from around G tube. Getting TF at 45 cc/h  Objective: Vital signs in last 24 hours: Temp:  [98.5 F (36.9 C)-101.6 F (38.7 C)] 101.6 F (38.7 C) (11/01 1142) Pulse Rate:  [85-107] 88 (11/01 1100) Resp:  [11-35] 32 (11/01 1100) BP: (106-164)/(65-84) 141/73 (11/01 1100) SpO2:  [97 %-100 %] 100 % (11/01 1142) FiO2 (%):  [30 %] 30 % (11/01 1142) Weight:  [127.6 kg] 127.6 kg (11/01 0500) Last BM Date: 09/04/18  Intake/Output from previous day: 10/31 0701 - 11/01 0700 In: 3834.5 [I.V.:1049.4; NG/GT:2080; IV Piggyback:400.1] Out: 2740 [Urine:2740] Intake/Output this shift: Total I/O In: 459.8 [I.V.:149.9; Other:75; NG/GT:135; IV Piggyback:99.9] Out: -   PE: Wound with feculent drainage,Feculent drainage from around G tube and penrose site Abd soft Diaphoretic  Mildly tachycardic  Lab Results:  Recent Labs    09/04/18 1106 09/12/2018 0613  WBC 14.8* 11.7*  HGB 7.0* 6.8*  HCT 24.8* 24.0*  PLT 523* 541*   BMET Recent Labs    09/04/18 0753 09/19/2018 0613  NA 146* 149*  K 4.0 3.8  CL 118* 119*  CO2 22 23  GLUCOSE 118* 110*  BUN 56* 52*  CREATININE 1.31* 1.28*  CALCIUM 11.3* 11.1*   PT/INR No results for input(s): LABPROT, INR in the last 72 hours. CMP     Component Value Date/Time   NA 149 (H) 09/18/2018 0613   K 3.8 10/01/2018 0613   CL 119 (H) 09/30/2018 0613   CO2 23 09/22/2018 0613   GLUCOSE 110 (H) 09/13/2018 0613   BUN 52 (H) 09/13/2018 0613   CREATININE 1.28 (H) 10/04/2018 0613   CREATININE 0.91 04/09/2016 1213   CALCIUM 11.1 (H) 09/11/2018 0613   PROT 7.8 01/23/2018 1352   ALBUMIN 1.4 (L) 09/30/2018 0613   AST 14 (L) 01/23/2018 1352   ALT 11 (L) 01/23/2018 1352   ALKPHOS 92 01/23/2018 1352   BILITOT 0.8 01/23/2018 1352   GFRNONAA 45 (L) 09/09/2018 0613   GFRNONAA 71 04/09/2016 1213    GFRAA 52 (L) 09/28/2018 0613   GFRAA 82 04/09/2016 1213   Lipase     Component Value Date/Time   LIPASE 41 01/23/2018 1352       Studies/Results: Ct Abdomen Pelvis W Contrast  Result Date: 09/03/2018 CLINICAL DATA:  Acute generalized abdominal pain. EXAM: CT ABDOMEN AND PELVIS WITH CONTRAST TECHNIQUE: Multidetector CT imaging of the abdomen and pelvis was performed using the standard protocol following bolus administration of intravenous contrast. CONTRAST:  75 mL OMNIPAQUE IOHEXOL 300 MG/ML  SOLN COMPARISON:  CT scan of September 01, 2018. FINDINGS: Lower chest: Mild bilateral posterior basilar subsegmental atelectasis is noted. Hepatobiliary: No focal liver abnormality is seen. No gallstones, gallbladder wall thickening, or biliary dilatation. Pancreas: Unremarkable. No pancreatic ductal dilatation or surrounding inflammatory changes. Spleen: Normal in size without focal abnormality. Adrenals/Urinary Tract: Adrenal glands appear normal. Stable left renal cysts are noted. No hydronephrosis or renal obstruction is noted. Urinary bladder is decompressed secondary to Foley catheter. Stomach/Bowel: New gastrostomy tube is noted with tip within gastric lumen of distal stomach. There appears to be a surgical drain placed in the subcutaneous tissues around previous gastrostomy site. There is no evidence of bowel obstruction or inflammation. The appendix appears normal. Vascular/Lymphatic: No significant vascular findings are present. No enlarged abdominal or pelvic lymph nodes. Reproductive: Enlarged fibroid uterus is noted. No adnexal  abnormality is noted. Other: There is noted soft tissue gas and stranding in the peritoneal fat anterior and inferior to the stomach which most likely is related to recent gastrostomy tube placement. Large defect is seen in anterior subcutaneous tissues of right upper quadrant of the abdomen which may be postsurgical. Musculoskeletal: No acute or significant osseous findings.  IMPRESSION: Mild bilateral posterior basilar subsegmental atelectasis. Interval placement of new gastrostomy tube with tip and balloon within gastric lumen. Surgical drain is seen in the soft tissues in previous gastrostomy site. Small amount of gas and stranding is seen in the peritoneal fat anterior and inferior to the stomach which most likely is related to gastrostomy placement. Probable surgical wound seen in right upper quadrant of the anterior abdominal wall. 8.2 x 2.3 cm gas and fluid collection is seen in the anterior abdominal wall which is unchanged compared to prior exam. Possible abscess cannot be excluded. Enlarged fibroid uterus. Electronically Signed   By: Marijo Conception, M.D.   On: 09/03/2018 19:30   Dg Chest Port 1 View  Result Date: 10/01/2018 CLINICAL DATA:  Stroke, hypertension EXAM: PORTABLE CHEST 1 VIEW COMPARISON:  08/27/2018 FINDINGS: Tracheostomy is unchanged. Cardiomegaly with vascular congestion. Low lung volumes. Improving right base atelectasis. No effusions or acute bony abnormality. IMPRESSION: Stable cardiomegaly and vascular congestion. Improving right base atelectasis. Continued low lung volumes. Electronically Signed   By: Rolm Baptise M.D.   On: 10/02/2018 08:54    Anti-infectives: Anti-infectives (From admission, onward)   Start     Dose/Rate Route Frequency Ordered Stop   09/04/18 1600  cefTRIAXone (ROCEPHIN) 2 g in sodium chloride 0.9 % 100 mL IVPB     2 g 200 mL/hr over 30 Minutes Intravenous Every 24 hours 09/04/18 1437     09/04/18 1600  metroNIDAZOLE (FLAGYL) IVPB 500 mg     500 mg 100 mL/hr over 60 Minutes Intravenous Every 8 hours 09/04/18 1437     09/02/18 0600  vancomycin (VANCOCIN) 500 mg in sodium chloride 0.9 % 100 mL IVPB  Status:  Discontinued     500 mg 100 mL/hr over 60 Minutes Intravenous Every 12 hours 08/06/2018 1619 09/03/18 1025   08/30/18 1600  vancomycin (VANCOCIN) IVPB 750 mg/150 ml premix  Status:  Discontinued     750 mg 150 mL/hr  over 60 Minutes Intravenous Every 12 hours 08/30/18 1334 08/21/2018 1619   08/29/18 2300  vancomycin (VANCOCIN) IVPB 750 mg/150 ml premix  Status:  Discontinued     750 mg 150 mL/hr over 60 Minutes Intravenous Every 12 hours 08/29/18 1223 08/29/18 1327   08/29/18 2300  vancomycin (VANCOCIN) IVPB 1000 mg/200 mL premix  Status:  Discontinued     1,000 mg 200 mL/hr over 60 Minutes Intravenous Every 12 hours 08/29/18 1327 08/30/18 1139   08/29/18 1800  cefTAZidime (FORTAZ) 2 g in sodium chloride 0.9 % 100 mL IVPB  Status:  Discontinued     2 g 200 mL/hr over 30 Minutes Intravenous Every 8 hours 08/29/18 1228 09/04/18 1437   08/26/18 2300  vancomycin (VANCOCIN) IVPB 1000 mg/200 mL premix  Status:  Discontinued     1,000 mg 200 mL/hr over 60 Minutes Intravenous Every 12 hours 08/26/18 0923 08/29/18 1223   08/26/18 1000  vancomycin (VANCOCIN) 2,500 mg in sodium chloride 0.9 % 500 mL IVPB     2,500 mg 250 mL/hr over 120 Minutes Intravenous  Once 08/26/18 0923 08/26/18 1900   08/26/18 1000  cefTAZidime (FORTAZ) 1 g in sodium  chloride 0.9 % 100 mL IVPB  Status:  Discontinued     1 g 200 mL/hr over 30 Minutes Intravenous Every 8 hours 08/26/18 0923 08/29/18 1228   08/11/2018 1014  ceFAZolin (ANCEF) 2-4 GM/100ML-% IVPB    Note to Pharmacy:  Roma Kayser  : cabinet override      08/29/2018 1014 08/18/2018 2229       Assessment/Plan S/p PEG placement 10/21 S/p exploratory laparotomy with open gastrostomy tube and I&D 10/28 - feculent smelling drainage from midline wound and penrose - to OR for re-exploration later this afternoon   Hold TF  LOS: 22 days    Brigid Re , [facility] 09/08/2018, 12:20 PM Pager: [phone] Mon-Fri 7:00 am-4:30 pm Sat-Sun 7:00 am-11:30 am

## 2018-09-05 NOTE — Progress Notes (Signed)
CRITICAL VALUE ALERT  Critical Value:  HGB 6.8  Date & Time Notied:  11/1 0730  Provider Notified: Icard  Orders Received/Actions taken: 1 unit PRBC

## 2018-09-05 DEATH — deceased

## 2018-09-06 DIAGNOSIS — J9602 Acute respiratory failure with hypercapnia: Secondary | ICD-10-CM

## 2018-09-06 DIAGNOSIS — N390 Urinary tract infection, site not specified: Secondary | ICD-10-CM

## 2018-09-06 LAB — GLUCOSE, CAPILLARY
GLUCOSE-CAPILLARY: 138 mg/dL — AB (ref 70–99)
GLUCOSE-CAPILLARY: 148 mg/dL — AB (ref 70–99)
GLUCOSE-CAPILLARY: 137 mg/dL — AB (ref 70–99)
Glucose-Capillary: 129 mg/dL — ABNORMAL HIGH (ref 70–99)
Glucose-Capillary: 99 mg/dL (ref 70–99)

## 2018-09-06 LAB — RENAL FUNCTION PANEL
ANION GAP: 3 — AB (ref 5–15)
Albumin: 1.3 g/dL — ABNORMAL LOW (ref 3.5–5.0)
BUN: 55 mg/dL — ABNORMAL HIGH (ref 6–20)
CHLORIDE: 121 mmol/L — AB (ref 98–111)
CO2: 25 mmol/L (ref 22–32)
Calcium: 11 mg/dL — ABNORMAL HIGH (ref 8.9–10.3)
Creatinine, Ser: 1.31 mg/dL — ABNORMAL HIGH (ref 0.44–1.00)
GFR calc non Af Amer: 44 mL/min — ABNORMAL LOW (ref >60)
GFR, EST AFRICAN AMERICAN: 51 mL/min — AB (ref >60)
Glucose, Bld: 155 mg/dL — ABNORMAL HIGH (ref 70–99)
Phosphorus: 3.3 mg/dL (ref 2.5–4.6)
Potassium: 4.1 mmol/L (ref 3.5–5.1)
Sodium: 149 mmol/L — ABNORMAL HIGH (ref 135–145)

## 2018-09-06 LAB — CBC
HCT: 25 % — ABNORMAL LOW (ref 36.0–46.0)
HEMOGLOBIN: 7.1 g/dL — AB (ref 12.0–15.0)
MCH: 24.4 pg — AB (ref 26.0–34.0)
MCHC: 28.4 g/dL — AB (ref 30.0–36.0)
MCV: 85.9 fL (ref 80.0–100.0)
PLATELETS: 486 10*3/uL — AB (ref 150–400)
RBC: 2.91 MIL/uL — AB (ref 3.87–5.11)
RDW: 17.2 % — ABNORMAL HIGH (ref 11.5–15.5)
WBC: 14 10*3/uL — AB (ref 4.0–10.5)
nRBC: 0.6 % — ABNORMAL HIGH (ref 0.0–0.2)

## 2018-09-06 MED ORDER — THIAMINE HCL 100 MG/ML IJ SOLN
100.0000 mg | Freq: Every day | INTRAMUSCULAR | Status: DC
  Administered 2018-09-06 – 2018-09-09 (×4): 100 mg via INTRAVENOUS
  Filled 2018-09-06 (×4): qty 2

## 2018-09-06 MED ORDER — FREE WATER
300.0000 mL | Status: DC
  Administered 2018-09-06 – 2018-09-19 (×81): 300 mL

## 2018-09-06 MED ORDER — DEXTROSE 5 % IV BOLUS
500.0000 mL | Freq: Once | INTRAVENOUS | Status: AC
  Administered 2018-09-06: 500 mL via INTRAVENOUS

## 2018-09-06 MED ORDER — FENTANYL CITRATE (PF) 100 MCG/2ML IJ SOLN
50.0000 ug | INTRAMUSCULAR | Status: DC | PRN
  Administered 2018-09-06 – 2018-10-13 (×51): 50 ug via INTRAVENOUS
  Filled 2018-09-06 (×56): qty 2

## 2018-09-06 NOTE — Progress Notes (Signed)
[facility] Progress Note  1 Day Post-Op  Subjective: CC: wound infection  Taken back to OR yesterday for necrotical abdominal wall abscess and feculent drainage. Per CCM physician whom has been seeing her daily, today she looks dramatically improved relative to days previously. TFs back on   Objective: Vital signs in last 24 hours: Temp:  [98.2 F (36.8 C)-101.6 F (38.7 C)] 99.3 F (37.4 C) (11/02 0800) Pulse Rate:  [76-107] 86 (11/02 0700) Resp:  [19-35] 28 (11/02 0700) BP: (105-161)/(62-86) 155/81 (11/02 0700) SpO2:  [94 %-100 %] 100 % (11/02 0732) Arterial Line BP: (123-195)/(57-82) 165/72 (11/02 0700) FiO2 (%):  [30 %-40 %] 40 % (11/02 0800) Weight:  [128.3 kg] 128.3 kg (11/02 0500) Last BM Date: 09/30/2018  Intake/Output from previous day: 11/01 0701 - 11/02 0700 In: 3064.8 [I.V.:1509.8; NG/GT:1080; IV Piggyback:400] Out: 2100 [Urine:2050; Blood:50] Intake/Output this shift: Total I/O In: 95 [I.V.:50; NG/GT:45] Out: 265 [Urine:265]  PE: Wound dressings taken down and changed - still with some foul smelling drainage in upper midline but wound base appears clean and viable. No erythema. Other packing sites changed as well - deep tracking cavities - packed with moist kerlex. Abd obese, soft  Lab Results:  Recent Labs    09/07/2018 1841 09/06/18 0456  WBC 12.6* 14.0*  HGB 7.4* 7.1*  HCT 26.7* 25.0*  PLT 554* 486*   BMET Recent Labs    09/14/2018 0613 09/06/18 0456  NA 149* 149*  K 3.8 4.1  CL 119* 121*  CO2 23 25  GLUCOSE 110* 155*  BUN 52* 55*  CREATININE 1.28* 1.31*  CALCIUM 11.1* 11.0*   PT/INR No results for input(s): LABPROT, INR in the last 72 hours. CMP     Component Value Date/Time   NA 149 (H) 09/06/2018 0456   K 4.1 09/06/2018 0456   CL 121 (H) 09/06/2018 0456   CO2 25 09/06/2018 0456   GLUCOSE 155 (H) 09/06/2018 0456   BUN 55 (H) 09/06/2018 0456   CREATININE 1.31 (H) 09/06/2018 0456   CREATININE 0.91 04/09/2016 1213   CALCIUM 11.0 (H) 09/06/2018 0456   PROT 7.8 01/23/2018 1352   ALBUMIN 1.3 (L) 09/06/2018 0456   AST 14 (L) 01/23/2018 1352   ALT 11 (L) 01/23/2018 1352   ALKPHOS 92 01/23/2018 1352   BILITOT 0.8 01/23/2018 1352   GFRNONAA 44 (L) 09/06/2018 0456   GFRNONAA 71 04/09/2016 1213   GFRAA 51 (L) 09/06/2018 0456   GFRAA 82 04/09/2016 1213   Lipase     Component Value Date/Time   LIPASE 41 01/23/2018 1352       Studies/Results: Dg Chest Port 1 View  Result Date: 09/07/2018 CLINICAL DATA:  Stroke, hypertension EXAM: PORTABLE CHEST 1 VIEW COMPARISON:  08/19/2018 FINDINGS: Tracheostomy is unchanged. Cardiomegaly with vascular congestion. Low lung volumes. Improving right base atelectasis. No effusions or acute bony abnormality. IMPRESSION: Stable cardiomegaly and vascular congestion. Improving right base atelectasis. Continued low lung volumes. Electronically Signed   By: Rolm Baptise M.D.   On: 09/25/2018 08:54    Anti-infectives: Anti-infectives (From admission, onward)   Start     Dose/Rate Route Frequency Ordered Stop   09/04/18 1600  cefTRIAXone (ROCEPHIN) 2 g in sodium chloride 0.9 % 100 mL IVPB     2 g 200 mL/hr over 30 Minutes Intravenous Every 24 hours 09/04/18 1437     09/04/18 1600  metroNIDAZOLE (FLAGYL) IVPB 500 mg     500 mg 100 mL/hr over 60 Minutes Intravenous Every 8  hours 09/04/18 1437     09/02/18 0600  vancomycin (VANCOCIN) 500 mg in sodium chloride 0.9 % 100 mL IVPB  Status:  Discontinued     500 mg 100 mL/hr over 60 Minutes Intravenous Every 12 hours 08/22/2018 1619 09/03/18 1025   08/30/18 1600  vancomycin (VANCOCIN) IVPB 750 mg/150 ml premix  Status:  Discontinued     750 mg 150 mL/hr over 60 Minutes Intravenous Every 12 hours 08/30/18 1334 08/14/2018 1619   08/29/18 2300  vancomycin (VANCOCIN) IVPB 750 mg/150 ml premix  Status:  Discontinued     750 mg 150 mL/hr over 60 Minutes Intravenous Every 12 hours 08/29/18 1223 08/29/18 1327   08/29/18 2300  vancomycin  (VANCOCIN) IVPB 1000 mg/200 mL premix  Status:  Discontinued     1,000 mg 200 mL/hr over 60 Minutes Intravenous Every 12 hours 08/29/18 1327 08/30/18 1139   08/29/18 1800  cefTAZidime (FORTAZ) 2 g in sodium chloride 0.9 % 100 mL IVPB  Status:  Discontinued     2 g 200 mL/hr over 30 Minutes Intravenous Every 8 hours 08/29/18 1228 09/04/18 1437   08/26/18 2300  vancomycin (VANCOCIN) IVPB 1000 mg/200 mL premix  Status:  Discontinued     1,000 mg 200 mL/hr over 60 Minutes Intravenous Every 12 hours 08/26/18 0923 08/29/18 1223   08/26/18 1000  vancomycin (VANCOCIN) 2,500 mg in sodium chloride 0.9 % 500 mL IVPB     2,500 mg 250 mL/hr over 120 Minutes Intravenous  Once 08/26/18 0923 08/26/18 1900   08/26/18 1000  cefTAZidime (FORTAZ) 1 g in sodium chloride 0.9 % 100 mL IVPB  Status:  Discontinued     1 g 200 mL/hr over 30 Minutes Intravenous Every 8 hours 08/26/18 0923 08/29/18 1228   09/03/2018 1014  ceFAZolin (ANCEF) 2-4 GM/100ML-% IVPB    Note to Pharmacy:  Roma Kayser  : cabinet override      09/01/2018 1014 08/10/2018 2229       Assessment/Plan S/p PEG placement 10/21 S/p exploratory laparotomy with open gastrostomy tube and I&D 10/28 S/p takeback for repeat I&D abdominal wall + exlap 11/1 - Doing well - Continue broad spec IV abx; wet to dry dressings - change twice daily - Appreciate CCM assistance in her care - Continue tube feeds   LOS: 22 days   Sharon Mt. Dema Severin, M.D. [facility]

## 2018-09-06 NOTE — Progress Notes (Addendum)
NAME:  Andrea [name], MRN:  [id number], DOB:  01-Dec-1960, LOS: 9 ADMISSION DATE:  08/05/2018, CONSULTATION DATE:  08/20/2018 REFERRING MD:  Dr. Rory Percy, CHIEF COMPLAINT:  CP/ Acute CVA  Brief History   12 yoF w/poorly controlled HTN and IDDM presenting with CP and left arm pain w/BP 220/117. Initial CTH neg.  Cardiac workup negative for acute event thus far, cardiology following.  Code stroke initiated for R hemiplegia, dysarthria and right facial droop.  Out of window for TPA.  Requiring cleviprex for BP control.  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  Evolving pontine stroke on MRI. Intubated 10/10 for neuro IR s/p stenting and angioplasty with 80-90% patency.  Extubated post procedure but due to insufficency placed on BiPAP and brought to ICU.  Required reintubation and ultimately trach (10/21 JY).  Peg tube placed 10/21 by CCS.  Taken to OR 10/28 for open gastrostomy and I&D of abdominal wall abscess. 11/1 taken back to OR for repeat ex-lap.     Past Medical History  HTN, IDDM, hypothyroidism s/p ablation  Significant Hospital Events   10/9 present to [facility] -transferred to [facility] 10/9 basilar artery stenting 10/9 intubated urgently 6h post procedure for inability to protect airway. 10/10 CTA for declining exam - evolving pontine infarct, stent deemed to be patent 10/14 Brillinta stopped and ASA/ heparin started to in anticipation of tracheostomy on Monday. 10/14 through 10/19: Neuro exam has been stable sodium increasing in spite of free water via tube, providing supportive care awaiting trach 10/20: Sodium down to 151 from 156 clinically looks the same 10/21: Sodium down to 148, sputum culture sent for purulent tracheal secretions, awaiting trach and PEG.  Purulent bronchial secretions from right upper lobe sent for BAL 10/22: Tracheostomy and PEG completed on the 21st tolerated well.  Spiking low-grade fever white cells continued to climb starting empiric  vancomycin and ceftaz.  Sodium improved down to 146.  Blood pressure improving but still not at goal added hydrochlorothiazide.  Resumed Brilinta.  10/23: Sodium normal.  Backing down on free water.  Glycemic control challenging, increased basal NovoLog in addition to Lantus and sliding scale.  Still spiking fever, white blood cell count climbing, sending blood cultures.  Foley catheter being placed for pressure ulcer caused by pure wick device 10/27 Foul smelling dark trach secretions, PEG secretions  10/28 to OR for ex lap due to PEG dislodgement.  PEG replaced 10/29 remains on MV 2/2 tachypnea; family meeting 10/30 no weaning 2/2 to ongoing tachypnea; foul smelling drainage from abd site 10/31 no weaning, abx changed, family meeting  Consults: date of consult/date signed off & final recs:  Cards 10/10 Neurology 10/10 PCCM 10/10 CCS 10/17  Procedures (surgical and bedside):  10/10 Neuro IR >> s/p stent and angioplasty of distal basilar artery  10/10 ETT for procedure 10/10 ETT (reintubated) >> removed 10/21: Size 6 tracheostomy placed by Dr. Nelda Marseille 10/26 changed to #4 trach cuffless  10/28 Ex lap with replacement of gastrostomy tube due to dislodgement 11/1 repeat Ex-lap with I&D of abdominal wall abscess   Significant Diagnostic Tests:  10/10  CTH >>  No acute intracranial abnormalities. 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 No definite acute thrombus identified. Severe stenosis left posterior cerebral artery  and moderate stenosis right posterior cerebral artery. Mild stenosis left MCA and moderate stenosis left MCA bifurcation. No significant carotid or vertebral artery stenosis in the neck. 10/10 MRI >> evolving pontine infarct. 10/11 ECHO >> normal LVSF with  severe LVH. 10/28 CT A / P > dislodged PEG tube outside of the peritoneum and not within the stomach.  Cholelithiasis. No bowel obstruction.  No hydronephrosis.  10/30 CT abd/pelvis >> Mild bilateral atelectasis.  Interval placement of new gastrostomy tube with tip and balloon within gastric lumen. Surgical drain is seen in the soft tissues in previous gastrostomy site. Small amount of gas and stranding is seen in the peritoneal fat anterior and inferior to the stomach which most likely is related to gastrostomy placement. Probable surgical wound seen in right upper quadrant of the anterior abdominal wall.  8.2 x 2.3 cm gas and fluid collection is seen in the anterior abdominal wall which is unchanged compared to prior exam. Possible abscess cannot be excluded.  Enlarged fibroid uterus.  Micro Data:  10/10 MRSA PCR >> negative 10/21 Sputum culture >> nl resp flora  10/23 Blood cultures x 2 >> negative 10/26 Blood cultures x 2 >> neg 10/28 MRSA PCR >> neg 10/27 Sputum culture >> normal flora  Antimicrobials:  10/10 cefazolin preop 10/22 Vancomycin >> 10/30 10/22 Ceftaz >> 10/31 10/31 ceftriaxone >> 10/31 flagyl >>  Subjective:  Fevers better. HR better. Went to OR yesterday for repeat I&D. Discussed with nursing and surgery   Objective   Blood pressure (!) 155/81, pulse 86, temperature 99.7 F (37.6 C), temperature source Axillary, resp. rate (!) 28, height _0  (1.626 m), weight 128.3 kg, last menstrual period 07/26/2018, SpO2 100 %.    Vent Mode: PRVC FiO2 (%):  [30 %-40 %] 40 % Set Rate:  [15 bmp] 15 bmp Vt Set:  [420 mL] 420 mL PEEP:  [5 cmH20-8 cmH20] 5 cmH20 Plateau Pressure:  [12 cmH20-18 cmH20] 18 cmH20   Intake/Output Summary (Last 24 hours) at 09/06/2018 0819 Last data filed at 09/06/2018 0700 Gross per 24 hour  Intake 2944.83 ml  Output 2100 ml  Net 844.83 ml   Filed Weights   09/04/18 0500 10/02/2018 0500 09/06/18 0500  Weight: 128.1 kg 127.6 kg 128.3 kg     Examination:  General appearance: 57 y.o., female, chronically ill appearing  Eyes: anicteric sclerae, moist conjunctivae; will tracking and following blinking and movement commands HENT: NCAT; oropharynx, MMM, Neck: Trachea midline Lungs: CTAB, no crackles, no wheeze, BL vented breath sounds  CV: RRR, S1, S2, distant heart tones Abdomen: Soft, non-tender; non-distended, BS quite, large abandage in place s/p surgery. Dressing taken down by surgeon and packing removed this am  Extremities: trace BL LE edema  Skin: Normal temperature, turgor and texture; no rash Psych: unable to fully assess, but she appears calm  Neuro: alert to voice, functional quad    Assessment & Plan:   Respiratory failure/ hypoxemia due to inability to protect airway from cva -Expected airway difficulties with location of stroke s/p tracheostomy 10/21 - 11/1 CXR reviewed- stable trach, improving RLL atelectasis  P: At this point she remains on full vent support.  Unable to wean post-op abdominal surgery  She is improving and hopefully will get back to TCT like she was at the beginning of the week.   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/p stent placement with extension of infarcts into the pontine and medullary spaces and small bilateral cerebellar infarcts   -  Resulting in inadequate airway protection and quadriplegia P: ASA and brilinta per neurology service  Will need aggressive pt/ot  May be a candidate for eye movement communication device in the future   Abd wall abscess  - s/p I&D 10/29 by CCS P: Continue ceftriaxone and flagyl  Would complete 7-10days following complete abscess drainage   Hypertension P: On coreg hydralzine, norvas and hctz prns available for SBP >180   Hypernatremia  P: Increased FWD to Q4H, will give 500cc of d5W  AKI - stable sCr/ UOP, improving P: Scr stable, follow with BMP daily   Dislodged PEG tube with associated abdominal wall  absecess - s/p ex lap with I&D and replacement of PEG 10/28, 11/1 repeat ex-lap  - purulent drainage noted 10/31, repeat CT abd grossly unchanged in fluid collections P: Went OR yesterday again for ex-lap Post-op care per surgery  Continue ceftriaxone and metronidazole  Added fentanyl for post op pain I stopped the megace that was started by surgery I discussed with Dr. Hulen Skains    Normocytic anemia - Hgb 7 -> 6.8 - no obvious source of bleeding, possible post-op blood loss  P:  Following CBC, given 1 U PRBCs yesterday  Hypercalcemia  - Ca corrects to 13.2 P:  Suspect from FWD, will continue to observe   Disposition / Summary of Today's Plan 09/06/18   Correct electroytes   Best Practice  Diet: TFs Pain/Anxiety/Delirium protocol (if indicated): RASS goal 0  VAP protocol (if indicated): Ordered DVT prophylaxis: SCDs  GI prophylaxis: Famotidine Hyperglycemia protocol: SSI, lantus Mobility: Bedrest Code Status: Full  Family: ongoing with PMT   This patient is critically ill with multiple organ system failure; which, requires frequent high complexity decision making, assessment, support, evaluation, and titration of therapies. This was completed through the application of advanced monitoring technologies and extensive interpretation of multiple databases. During this encounter critical care time was devoted to patient care services described in this note for 35 minutes.   Garner Nash, DO [facility] 09/06/2018 8:19 AM  Personal pager: [phone] If unanswered, please page CCM On-call: [phone]

## 2018-09-06 NOTE — Progress Notes (Signed)
STROKE TEAM PROGRESS NOTE   SUBJECTIVE (INTERVAL HISTORY) Pt RN and sister are at bedside. Pt still on vent, will do weaning today. Pt awake alert, frustrated about recent complication but understood the event. Able to wiggle left toes but no other movement. Afebrile overnight. Na still high. Abdominal drainage minimal. Family request to transfer to [facility] or [facility] were all denied by those medical centers.   OBJECTIVE Vitals:   09/06/18 0500 09/06/18 0600 09/06/18 0700 09/06/18 0732  BP: 130/69 (!) 152/84 (!) 155/81   Pulse: 84 93 86   Resp: (!) 31 (!) 35 (!) 28   Temp:      TempSrc:      SpO2: 100% 100% 100% 100%  Weight: 128.3 kg     Height:        CBC:  Recent Labs  Lab 09/10/2018 1841 09/06/18 0456  WBC 12.6* 14.0*  HGB 7.4* 7.1*  HCT 26.7* 25.0*  MCV 87.3 85.9  PLT 554* 486*    Basic Metabolic Panel:  Recent Labs  Lab 09/17/2018 0613 09/06/18 0456  NA 149* 149*  K 3.8 4.1  CL 119* 121*  CO2 23 25  GLUCOSE 110* 155*  BUN 52* 55*  CREATININE 1.28* 1.31*  CALCIUM 11.1* 11.0*  MG 2.5*  --   PHOS 2.5 3.3    Lipid Panel:     Component Value Date/Time   CHOL 256 (H) 08/15/2018 0512   TRIG 248 (H) 08/15/2018 0512   HDL 35 (L) 08/15/2018 0512   CHOLHDL 7.3 08/15/2018 0512   VLDL 50 (H) 08/15/2018 0512   LDLCALC 171 (H) 08/15/2018 0512   HgbA1c:  Lab Results  Component Value Date   HGBA1C 11.4 (H) 08/15/2018   Urine Drug Screen:     Component Value Date/Time   LABOPIA NONE DETECTED 08/13/2018 1836   COCAINSCRNUR NONE DETECTED 08/23/2018 1836   LABBENZ NONE DETECTED 08/30/2018 1836   AMPHETMU NONE DETECTED 08/31/2018 1836   THCU NONE DETECTED 08/27/2018 1836   LABBARB NONE DETECTED 08/30/2018 1836    Alcohol Level No results found for: ETH  IMAGING  Ct Angio Head W Or Wo Contrast Ct Angio Neck W Or Wo Contrast 08/15/2018 IMPRESSION:  1. Interval basilar to left proximal PCA stenting. The density of the stent walls precludes detection of in stent  stenosis; there is a degree of wasting at the mid basilar segment. There is flow in the left PCA beyond the stent such that there is presumed stent patency.  2. Known lower pontine infarct. There are new small cerebellar infarcts since brain MRI yesterday.  3. Severe left P2 segment stenosis, also seen previously.  4. Moderate atheromatous narrowing in the proximal left MCA.    Ct Angio Head W Or Wo Contrast Ct Angio Neck W Or Wo Contrast 08/07/2018 IMPRESSION:  1. 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 No definite acute thrombus identified.  2. Severe stenosis left posterior cerebral artery and moderate stenosis right posterior cerebral artery.  3. Mild stenosis left MCA and moderate stenosis left MCA bifurcation.  4. No significant carotid or vertebral artery stenosis in the neck.    Ct Head Wo Contrast 08/31/2018 IMPRESSION:  No acute intracranial abnormalities. Mild white matter changes likely due to small vessel ischemia.    Mr Brain Wo Contrast 08/31/2018 IMPRESSION:  1. Acute nonhemorrhagic infarct involving the left paramedian brainstem. The infarct crosses midline.  2. Other periventricular and subcortical white matter disease  is moderately advanced for age. This likely reflects the sequela of chronic microvascular ischemia.  3. Tapering of the dens with prominent soft tissue pannus. 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    Ct Head Code Stroke Wo Contrast 08/06/2018  IMPRESSION:  1. No acute abnormality and no change from earlier today  2. ASPECTS is 10 3.    Cerebral Angiogram 08/20/2018 S/P 4 vessel cerebral arteriogram RT CFA approach. 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 S/P stent assisted angioplasty of distal basilar artery with patency of 80 to 90 %. 2.Approx 70 % stenosis of  LT ICA supraclinoid seg   MRI 08/15/18 1. Progressive acute pontine infarct. New patchy bilateral cerebellar infarction. New tiny left thalamic infarcts. 2. Preserved flow void in the stented basilar. 3. left facial/submandibular edema, please correlate with neck exam. Edited result: IMPRESSION: 1. Progressive acute pontine infarct. New patchy bilateral cerebellar infarction. New tiny left thalamic infarcts. 2. Preserved flow void in the stented basilar.  TTE - Left ventricle: The cavity size was normal. There was severe   concentric hypertrophy. Systolic function was normal. The   estimated ejection fraction was in the range of 60% to 65%. Wall   motion was normal; there were no regional wall motion   abnormalities. Doppler parameters are consistent with abnormal   left ventricular relaxation (grade 1 diastolic dysfunction). The   E/e&' ratio is <8, suggesting normal LV filling pressure. - Aortic valve: Trileaflet; mildly calcified leaflets.   Transvalvular velocity was minimally increased. There was no   regurgitation. Mean gradient (S): 12 mm Hg. - Mitral valve: Mildly thickened leaflets . There was trivial   regurgitation. - Left atrium: The atrium was normal in size. - Inferior vena cava: The vessel was dilated. The respirophasic   diameter changes were blunted (< 50%), consistent with elevated   central venous pressure. Impressions: - LVEF 60-65%, severe LVH, normal wall motion, grade 1 DD, normal   LV filling pressure, minimally increased aortic velocity without   signficant stenosis, trivial MR, normal LA size, dilated IVC.  CT abdomen and pelvis 09/03/18 Mild bilateral posterior basilar subsegmental atelectasis. Interval placement of new gastrostomy tube with tip and balloon within gastric lumen. Surgical drain is seen in the soft tissues in previous gastrostomy site. Small amount of gas and stranding is seen in the peritoneal fat anterior and inferior to the stomach  which most likely is related to gastrostomy placement. Probable surgical wound seen in right upper quadrant of the anterior abdominal wall. 8.2 x 2.3 cm gas and fluid collection is seen in the anterior abdominal wall which is unchanged compared to prior exam. Possible abscess cannot be excluded.  Enlarged fibroid uterus.  PHYSICAL EXAM  Temp:  [98.2 F (36.8 C)-101.6 F (38.7 C)] 99.7 F (37.6 C) (11/02 0400) Pulse Rate:  [76-107] 86 (11/02 0700) Resp:  [11-35] 28 (11/02 0700) BP: (105-161)/(62-86) 155/81 (11/02 0700) SpO2:  [94 %-100 %] 100 % (11/02 0732) Arterial Line BP: (123-195)/(57-82) 165/72 (11/02 0700) FiO2 (%):  [30 %-40 %] 40 % (11/02 0732) Weight:  [128.3 kg] 128.3 kg (11/02 0500)  General -  Obese middle-aged African-American lady, not in distress. She has abdominal surgical wound and drainage from the abdominal wall   Ophthalmologic - fundi not visualized due to noncooperation.  Cardiovascular - not tachycardic currently in the 80s RRR  Neuro - post trach, on vent, off  sedation. Eyes open, awake, alert. No ptosis or EOMI, denies diplopia.  PERRL. blinking to visual threat bilaterally. Right gaze nystagmus, can follow and track examiner across the room. Bilateral facial weakness, R>L, corneal, gag and cough present. Able to open mouth and protrude tongue slightly. Quadriplegia except left toe wiggling and left hand pinky slight movement. Grimaces to pain to the BUE. DTR diminished throughout. Muscle tone decreased. Coordination and gait not tested.    ASSESSMENT/PLAN Ms. NATASH BERMAN is a 57 y.o. female with history of difficult to control hypertension, insulin-dependent diabetes, obesity, hypothyroidism status post ablation presenting with chest discomfort, right-sided weakness, slurred speech and right facial droop. She did not receive IV t-PA due to late presentation. S/P stent assisted angioplasty of distal basilar artery.  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 Worsening symptoms with extension of pontine/medullary infarcts with new small bilateral cerebellar infarcts without evidence of stent re-stenosis or occlusion  Resultant quadriplegia, on trach and peg  CT head - No acute intracranial abnormalities.  CTA H&N 08/30/2018 - Extensive atherosclerotic disease in the basilar with focal critical stenosis in the mid to distal basilar. No definite acute thrombus identified. Severe stenosis left posterior cerebral artery  MRI head - Acute nonhemorrhagic infarct involving the left paramedian brainstem.   CTA H&N 08/15/2018 - stent too dense to see BA lumen, but distal flow preserved. New small cerebellar infarcts since brain MRI yesterday.  Severe left P2 segment stenosis, also seen previously.   Repeat MRI - extension of b/l pontine and upper medullary infarcts with new b/l cerebellar infarcts.  2D Echo - EF 60-65%  LDL - 174  HgbA1c - 11.1  VTE prophylaxis - heparin subq  aspirin 81 mg daily prior to admission, now resumed on Brilinta 90 mg bid and ASA 81 mg daily.   Patient counseled to be compliant with her antithrombotic medications  Ongoing aggressive stroke risk factor management  Therapy recommendations:  LTACH  Disposition:  Pending - other hospitals declined transfer as per family requests  Intracranial stenosis  BA mid to distal severe stenosis s/p BA stenting  Left PCA severe stenosis, R PCA moderate stenosis  Left MCA moderate stenosis  Uncontrolled stroke risk factors with HLD, HTN, DM  Non compliance with meds at home  Respiratory failure  S/p trach 09/04/2018  On trach collar then put back on vent for surgeries  Will wean off vent today  CCM on board  Hypertension  Stable  BP goal 130-150   On po norvasc, losartan  Metoprolol changed to Coreg 25 bid, also on HCTZ 25mg  now  Also on low dose hydralazine 25mg  tid  Hyperlipidemia  Lipid lowering  medication PTA:  Lipitor 20 mg daily  LDL 174, goal < 70  Increase to 80 mg daily  Continue statin at discharge  Diabetes  HgbA1c 11.4, goal < 7.0  Uncontrolled  Hyperglycemia improving  Lantus 22U bid   Continue basal NovoLog 8U Q4h  SSI  CBG monitoring  Low grade fever with leukocytosis  Tmax 101.6   Leukocytosis - 14.0  UA WBC 21-50  CXR unremarkable, improving from prior  Sputum culture normal flora  On rocephin and flagyl   Blood cultures negative 10/26 ;  Respiratory cultures - mixed flora    CCM and trauma on board  Hypernatremia and elevated Cre/BUN  Na 149  Cre 1.31  Put on NS @ 50  On TF @ 45  On free water 300mg   Q4h  Continue BMP monitoring  Dysphagia s/p PEG  Continue tube feeding @ 45 cc/h  PEG done 08/30/2018  Removed and replaced dislodged PEG 08/26/2018  CT showed Abdominal wall abscess -s/p open surgical drainage 09/01/18 persistent foul odor discharge. Reexploration 09/18/2018  Anemia   Hb 6.8->PRBC->7.1  Likely due to iron deficiency and acute uterine bleeding  Iron panel showed iron deficiency  Put on iron pills  PRBC transfusion 1 U Friday 09/15/2018  CBC monitoring  Abnormal uterine bleeding  Has been following with GYN  Was on megace in the past  CT abd/pelvis 09/03/18 - enlarged fibroid uterus  resumed megace 80mg  bid  Continue ASA and brilinta for now  Vaginal bleeding much improved  Close CBC monitoring  Other Stroke Risk Factors  Obesity, Body mass index is 48.55 kg/m., recommend weight loss, diet and exercise as appropriate   Other Active Problems  Hypokalemia - on supplement for K 3.3->4.2 -> 4.5-> 3.8  Plts - 489 -> 553-> [facility] day # 23  This patient is critically ill due to worsening brainstem infarct, BA stenosis s/p stenting, hyperglycemia, hypertensive, fever, anemia, abdominal wall abscess s/p drainage, dislodged PEG and hypernatremiaand at significant risk of  neurological worsening, death form recurrent stroke, hemorrhagic conversion, CHF, seizure, aspiration, hemorrhagic shock. This patient's care requires constant monitoring of vital signs, hemodynamics, respiratory and cardiac monitoring, review of multiple databases, neurological assessment, discussion with family, other specialists and medical decision making of high complexity. I spent 45 minutes of neurocritical care time in the care of this patient.  Rosalin Hawking, MD PhD Stroke Neurology 09/06/2018 11:16 AM   To contact Stroke Continuity provider, please refer to [email or web address] After hours, contact General Neurology

## 2018-09-07 LAB — CBC WITH DIFFERENTIAL/PLATELET
Abs Immature Granulocytes: 0.17 10*3/uL — ABNORMAL HIGH (ref 0.00–0.07)
Basophils Absolute: 0 10*3/uL (ref 0.0–0.1)
Basophils Relative: 0 %
EOS ABS: 0.3 10*3/uL (ref 0.0–0.5)
Eosinophils Relative: 2 %
HEMATOCRIT: 24 % — AB (ref 36.0–46.0)
Hemoglobin: 6.8 g/dL — CL (ref 12.0–15.0)
IMMATURE GRANULOCYTES: 1 %
Lymphocytes Relative: 14 %
Lymphs Abs: 1.7 10*3/uL (ref 0.7–4.0)
MCH: 24.4 pg — AB (ref 26.0–34.0)
MCHC: 28.3 g/dL — ABNORMAL LOW (ref 30.0–36.0)
MCV: 86 fL (ref 80.0–100.0)
MONO ABS: 1 10*3/uL (ref 0.1–1.0)
Monocytes Relative: 9 %
NEUTROS ABS: 8.9 10*3/uL — AB (ref 1.7–7.7)
Neutrophils Relative %: 74 %
Platelets: 479 10*3/uL — ABNORMAL HIGH (ref 150–400)
RBC: 2.79 MIL/uL — ABNORMAL LOW (ref 3.87–5.11)
RDW: 17 % — ABNORMAL HIGH (ref 11.5–15.5)
WBC: 12.1 10*3/uL — ABNORMAL HIGH (ref 4.0–10.5)
nRBC: 0.9 % — ABNORMAL HIGH (ref 0.0–0.2)

## 2018-09-07 LAB — BASIC METABOLIC PANEL
ANION GAP: 5 (ref 5–15)
BUN: 51 mg/dL — ABNORMAL HIGH (ref 6–20)
CO2: 26 mmol/L (ref 22–32)
Calcium: 10.9 mg/dL — ABNORMAL HIGH (ref 8.9–10.3)
Chloride: 117 mmol/L — ABNORMAL HIGH (ref 98–111)
Creatinine, Ser: 1.09 mg/dL — ABNORMAL HIGH (ref 0.44–1.00)
GFR calc Af Amer: >60 mL/min (ref >60)
GFR calc non Af Amer: 55 mL/min — ABNORMAL LOW (ref >60)
GLUCOSE: 117 mg/dL — AB (ref 70–99)
POTASSIUM: 3.7 mmol/L (ref 3.5–5.1)
Sodium: 148 mmol/L — ABNORMAL HIGH (ref 135–145)

## 2018-09-07 LAB — GLUCOSE, CAPILLARY
GLUCOSE-CAPILLARY: 121 mg/dL — AB (ref 70–99)
Glucose-Capillary: 180 mg/dL — ABNORMAL HIGH (ref 70–99)
Glucose-Capillary: 102 mg/dL — ABNORMAL HIGH (ref 70–99)
Glucose-Capillary: 177 mg/dL — ABNORMAL HIGH (ref 70–99)
Glucose-Capillary: 154 mg/dL — ABNORMAL HIGH (ref 70–99)
Glucose-Capillary: 109 mg/dL — ABNORMAL HIGH (ref 70–99)

## 2018-09-07 LAB — HEMOGLOBIN AND HEMATOCRIT, BLOOD
HCT: 28.9 % — ABNORMAL LOW (ref 36.0–46.0)
HEMOGLOBIN: 8.2 g/dL — AB (ref 12.0–15.0)

## 2018-09-07 LAB — PREPARE RBC (CROSSMATCH)

## 2018-09-07 MED ORDER — FUROSEMIDE 10 MG/ML IJ SOLN
20.0000 mg | Freq: Once | INTRAMUSCULAR | Status: AC
  Administered 2018-09-07: 20 mg via INTRAVENOUS
  Filled 2018-09-07: qty 2

## 2018-09-07 MED ORDER — FERROUS SULFATE 300 (60 FE) MG/5ML PO SYRP
300.0000 mg | ORAL_SOLUTION | Freq: Three times a day (TID) | ORAL | Status: DC
  Administered 2018-09-07 – 2018-09-10 (×10): 300 mg
  Filled 2018-09-07 (×12): qty 5

## 2018-09-07 MED ORDER — NYSTATIN 100000 UNIT/ML MT SUSP
5.0000 mL | Freq: Four times a day (QID) | OROMUCOSAL | Status: DC
  Administered 2018-09-07 – 2018-10-22 (×157): 500000 [IU] via ORAL
  Filled 2018-09-07 (×152): qty 5

## 2018-09-07 MED ORDER — SODIUM CHLORIDE 0.9% IV SOLUTION
Freq: Once | INTRAVENOUS | Status: AC
  Administered 2018-09-07: 11:00:00 via INTRAVENOUS

## 2018-09-07 NOTE — Progress Notes (Signed)
NAME:  Andrea [name], MRN:  [id number], DOB:  [birth year], LOS: 24 ADMISSION DATE:  08/17/2018, CONSULTATION DATE:  08/08/2018 REFERRING MD:  Dr. Rory Percy, CHIEF COMPLAINT:  CP/ Acute CVA  Brief History   4 yoF w/poorly controlled HTN and IDDM presenting with CP and left arm pain w/BP 220/117. Initial CTH neg.  Cardiac workup negative for acute event thus far, cardiology following.  Code stroke initiated for R hemiplegia, dysarthria and right facial droop.  Out of window for TPA.  Requiring cleviprex for BP control.  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  Evolving pontine stroke on MRI. Intubated 10/10 for neuro IR s/p stenting and angioplasty with 80-90% patency.  Extubated post procedure but due to insufficency placed on BiPAP and brought to ICU.  Required reintubation and ultimately trach (10/21 JY).  Peg tube placed 10/21 by CCS.  Taken to OR 10/28 for open gastrostomy and I&D of abdominal wall abscess. 11/1 taken back to OR for repeat ex-lap.     Past Medical History  HTN, IDDM, hypothyroidism s/p ablation  Significant Hospital Events   10/9 present to [facility] -transferred to [facility] 10/9 basilar artery stenting 10/9 intubated urgently 6h post procedure for inability to protect airway. 10/10 CTA for declining exam - evolving pontine infarct, stent deemed to be patent 10/14 Brillinta stopped and ASA/ heparin started to in anticipation of tracheostomy on Monday. 10/14 through 10/19: Neuro exam has been stable sodium increasing in spite of free water via tube, providing supportive care awaiting trach 10/20: Sodium down to 151 from 156 clinically looks the same 10/21: Sodium down to 148, sputum culture sent for purulent tracheal secretions, awaiting trach and PEG.  Purulent bronchial secretions from right upper lobe sent for BAL 10/22: Tracheostomy and PEG completed on the 21st tolerated well.  Spiking low-grade fever white cells continued to climb starting empiric  vancomycin and ceftaz.  Sodium improved down to 146.  Blood pressure improving but still not at goal added hydrochlorothiazide.  Resumed Brilinta.  10/23: Sodium normal.  Backing down on free water.  Glycemic control challenging, increased basal NovoLog in addition to Lantus and sliding scale.  Still spiking fever, white blood cell count climbing, sending blood cultures.  Foley catheter being placed for pressure ulcer caused by pure wick device 10/27 Foul smelling dark trach secretions, PEG secretions  10/28 to OR for ex lap due to PEG dislodgement.  PEG replaced 10/29 remains on MV 2/2 tachypnea; family meeting 10/30 no weaning 2/2 to ongoing tachypnea; foul smelling drainage from abd site 10/31 no weaning, abx changed, family meeting  Consults: date of consult/date signed off & final recs:  Cards 10/10 Neurology 10/10 PCCM 10/10 CCS 10/17  Procedures (surgical and bedside):  10/10 Neuro IR >> s/p stent and angioplasty of distal basilar artery  10/10 ETT for procedure 10/10 ETT (reintubated) >> removed 10/21: Size 6 tracheostomy placed by Dr. Nelda Marseille 10/26 changed to #4 trach cuffless  10/28 Ex lap with replacement of gastrostomy tube due to dislodgement 11/1 repeat Ex-lap with I&D of abdominal wall abscess   Significant Diagnostic Tests:  10/10  CTH >>  No acute intracranial abnormalities. 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 No definite acute thrombus identified. Severe stenosis left posterior cerebral artery  and moderate stenosis right posterior cerebral artery. Mild stenosis left MCA and moderate stenosis left MCA bifurcation. No significant carotid or vertebral artery stenosis in the neck. 10/10 MRI >> evolving pontine infarct. 10/11 ECHO >> normal LVSF with  severe LVH. 10/28 CT A / P > dislodged PEG tube outside of the peritoneum and not within the stomach.  Cholelithiasis. No bowel obstruction.  No hydronephrosis.  10/30 CT abd/pelvis >> Mild bilateral atelectasis.  Interval placement of new gastrostomy tube with tip and balloon within gastric lumen. Surgical drain is seen in the soft tissues in previous gastrostomy site. Small amount of gas and stranding is seen in the peritoneal fat anterior and inferior to the stomach which most likely is related to gastrostomy placement. Probable surgical wound seen in right upper quadrant of the anterior abdominal wall.  8.2 x 2.3 cm gas and fluid collection is seen in the anterior abdominal wall which is unchanged compared to prior exam. Possible abscess cannot be excluded.  Enlarged fibroid uterus.  Micro Data:  10/10 MRSA PCR >> negative 10/21 Sputum culture >> nl resp flora  10/23 Blood cultures x 2 >> negative 10/26 Blood cultures x 2 >> neg 10/28 MRSA PCR >> neg 10/27 Sputum culture >> normal flora  Antimicrobials:  10/10 cefazolin preop 10/22 Vancomycin >> 10/30 10/22 Ceftaz >> 10/31 10/31 ceftriaxone >> 10/31 flagyl >>  Subjective:  No events overnight C/O tongue pain  Objective   Blood pressure (!) 109/59, pulse 79, temperature 98.3 F (36.8 C), temperature source Oral, resp. rate (!) 27, height _0  (1.626 m), weight 126.3 kg, last menstrual period 07/26/2018, SpO2 100 %.    Vent Mode: PRVC FiO2 (%):  [40 %] 40 % Set Rate:  [15 bmp] 15 bmp Vt Set:  [420 mL] 420 mL PEEP:  [5 cmH20] 5 cmH20 Plateau Pressure:  [10 cmH20-16 cmH20] 15 cmH20   Intake/Output Summary (Last 24 hours) at 09/07/2018 1418 Last data filed at 09/07/2018 1400 Gross per 24 hour  Intake 4158.95 ml  Output 3500 ml  Net 658.95 ml   Filed Weights   09/11/2018 0500 09/06/18 0500 09/07/18 0500  Weight: 127.6 kg 128.3 kg 126.3 kg   Examination:  General: chronically ill appearing female, NAD HEENT: [place]/AT,  PERRL, EOM-I and MMM, white plaques throughout the tongue Neck: Trachea midline Lungs: Coarse BS diffusely CV: RRR, Nl S1/S2 and -M/R/G Abdomen: Soft, NT, ND and +BS Extremities: -edema and -tenderness Skin: Intact Neuro: Alert and following some commands   Assessment & Plan:   Respiratory failure/ hypoxemia due to inability to protect airway from cva -Expected airway difficulties with location of stroke s/p tracheostomy 10/21 - 11/1 CXR reviewed- stable trach, improving RLL atelectasis  P: Attempt PS trials but doubt will be able to wean given surgery She is improving and hopefully will get back to TCT like she was at the beginning of the week.  Titrate O2 for sat of 88-92% Maintain current trach size and type  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/p stent placement with extension of infarcts into the pontine and medullary spaces and small bilateral cerebellar infarcts   -Resulting in inadequate airway protection and quadriplegia P: ASA and birllinta per neurolgy PT evaluation May be a candidate for eye movement communication device in the future   Abd wall abscess  - s/p I&D 10/29 by CCS P: Rocephin and flagyl, 7-10 days total after abscess has been drained  Hypertension P: On coreg hydralzine, norvas and hctz  PRN anti-HTN available for SBP >180   Hypernatremia  P: BMET in AM [facility] IVF Replace electrolytes as indicated  AKI - stable sCr/ UOP, improving P: BMET in AM KVO IVF  Dislodged PEG tube with associated abdominal wall absecess - s/p ex lap with I&D and replacement of PEG 10/28, 11/1 repeat ex-lap  - purulent drainage noted 10/31, repeat CT abd grossly unchanged in fluid collections P: Went OR 11/2 again for ex-lap Post-op care per surgery  Continue ceftriaxone and metronidazole  Added fentanyl for post op pain  Normocytic anemia - Hgb 7 -> 6.8 - no obvious source of bleeding, possible post-op blood loss  P:  Following CBC,  given 1 U PRBCs yesterday  Hypercalcemia  - Ca corrects to 13.2 P:  Suspect from FWD, will continue to observe   White plaques on tongue: nystatin   Disposition / Summary of Today's Plan 09/07/18   Correct electroytes   Best Practice  Diet: TFs Pain/Anxiety/Delirium protocol (if indicated): RASS goal 0  VAP protocol (if indicated): Ordered DVT prophylaxis: SCDs  GI prophylaxis: Famotidine Hyperglycemia protocol: SSI, lantus Mobility: Bedrest Code Status: Full  Family: ongoing with PMT   The patient is critically ill with multiple organ systems failure and requires high complexity decision making for assessment and support, frequent evaluation and titration of therapies, application of advanced monitoring technologies and extensive interpretation of multiple databases.   Critical Care Time devoted to patient care services described in this note is  32  Minutes. This time reflects time of care of this signee Dr Jennet Maduro. This critical care time does not reflect procedure time, or teaching time or supervisory time of PA/NP/Med student/Med Resident etc but could involve care discussion time.  Rush Farmer, M.D. [facility] Pulmonary/Critical Care Medicine. Pager: [phone]. After hours pager: [phone].

## 2018-09-07 NOTE — Progress Notes (Signed)
STROKE TEAM PROGRESS NOTE   SUBJECTIVE (INTERVAL HISTORY) Pt RN is at bedside. Pt still on vent, did not have weaning yesterday due to some increased WOB. Awake alert, eyes open. Neuro no change. BP on the soft side. Na 148 and Cre much improved. Will d/c HCTZ. Hb drop to 6.8, pt did continue to have some mild vaginal bleeding and some bloody drainage from abdomen wound but not heavy at all. Will continue to watch and give PRBC today.   OBJECTIVE Vitals:   09/07/18 0338 09/07/18 0400 09/07/18 0500 09/07/18 0748  BP:  123/67    Pulse:  82 87   Resp:  (!) 31 (!) 31   Temp:      TempSrc:      SpO2: 100% 100% 100% 100%  Weight:   126.3 kg   Height:        CBC:  Recent Labs  Lab 09/06/18 0456 09/07/18 0459  WBC 14.0* 12.1*  NEUTROABS  --  8.9*  HGB 7.1* 6.8*  HCT 25.0* 24.0*  MCV 85.9 86.0  PLT 486* 479*    Basic Metabolic Panel:  Recent Labs  Lab 09/11/2018 0613 09/06/18 0456 09/07/18 0459  NA 149* 149* 148*  K 3.8 4.1 3.7  CL 119* 121* 117*  CO2 23 25 26   GLUCOSE 110* 155* 117*  BUN 52* 55* 51*  CREATININE 1.28* 1.31* 1.09*  CALCIUM 11.1* 11.0* 10.9*  MG 2.5*  --   --   PHOS 2.5 3.3  --     Lipid Panel:     Component Value Date/Time   CHOL 256 (H) 08/15/2018 0512   TRIG 248 (H) 08/15/2018 0512   HDL 35 (L) 08/15/2018 0512   CHOLHDL 7.3 08/15/2018 0512   VLDL 50 (H) 08/15/2018 0512   LDLCALC 171 (H) 08/15/2018 0512   HgbA1c:  Lab Results  Component Value Date   HGBA1C 11.4 (H) 08/15/2018   Urine Drug Screen:     Component Value Date/Time   LABOPIA NONE DETECTED 08/20/2018 1836   COCAINSCRNUR NONE DETECTED 08/20/2018 1836   LABBENZ NONE DETECTED 08/05/2018 1836   AMPHETMU NONE DETECTED 08/23/2018 1836   THCU NONE DETECTED 08/07/2018 1836   LABBARB NONE DETECTED 08/31/2018 1836    Alcohol Level No results found for: ETH  IMAGING  Ct Angio Head W Or Wo Contrast Ct Angio Neck W Or Wo Contrast 08/15/2018 IMPRESSION:  1. Interval basilar to left  proximal PCA stenting. The density of the stent walls precludes detection of in stent stenosis; there is a degree of wasting at the mid basilar segment. There is flow in the left PCA beyond the stent such that there is presumed stent patency.  2. Known lower pontine infarct. There are new small cerebellar infarcts since brain MRI yesterday.  3. Severe left P2 segment stenosis, also seen previously.  4. Moderate atheromatous narrowing in the proximal left MCA.    Ct Angio Head W Or Wo Contrast Ct Angio Neck W Or Wo Contrast 08/11/2018 IMPRESSION:  1. 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 No definite acute thrombus identified.  2. Severe stenosis left posterior cerebral artery and moderate stenosis right posterior cerebral artery.  3. Mild stenosis left MCA and moderate stenosis left MCA bifurcation.  4. No significant carotid or vertebral artery stenosis in the neck.    Ct Head Wo Contrast 09/04/2018 IMPRESSION:  No acute intracranial abnormalities. Mild white matter changes likely due to small  vessel ischemia.    Mr Brain Wo Contrast 08/13/2018 IMPRESSION:  1. Acute nonhemorrhagic infarct involving the left paramedian brainstem. The infarct crosses midline.  2. Other periventricular and subcortical white matter disease is moderately advanced for age. This likely reflects the sequela of chronic microvascular ischemia.  3. Tapering of the dens with prominent soft tissue pannus. 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    Ct Head Code Stroke Wo Contrast 08/30/2018  IMPRESSION:  1. No acute abnormality and no change from earlier today  2. ASPECTS is 10 3.    Cerebral Angiogram 08/13/2018 S/P 4 vessel cerebral arteriogram RT CFA approach. 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 S/P stent assisted  angioplasty of distal basilar artery with patency of 80 to 90 %. 2.Approx 70 % stenosis of LT ICA supraclinoid seg   MRI 08/15/18 1. Progressive acute pontine infarct. New patchy bilateral cerebellar infarction. New tiny left thalamic infarcts. 2. Preserved flow void in the stented basilar. 3. left facial/submandibular edema, please correlate with neck exam. Edited result: IMPRESSION: 1. Progressive acute pontine infarct. New patchy bilateral cerebellar infarction. New tiny left thalamic infarcts. 2. Preserved flow void in the stented basilar.  TTE - Left ventricle: The cavity size was normal. There was severe   concentric hypertrophy. Systolic function was normal. The   estimated ejection fraction was in the range of 60% to 65%. Wall   motion was normal; there were no regional wall motion   abnormalities. Doppler parameters are consistent with abnormal   left ventricular relaxation (grade 1 diastolic dysfunction). The   E/e&' ratio is <8, suggesting normal LV filling pressure. - Aortic valve: Trileaflet; mildly calcified leaflets.   Transvalvular velocity was minimally increased. There was no   regurgitation. Mean gradient (S): 12 mm Hg. - Mitral valve: Mildly thickened leaflets . There was trivial   regurgitation. - Left atrium: The atrium was normal in size. - Inferior vena cava: The vessel was dilated. The respirophasic   diameter changes were blunted (< 50%), consistent with elevated   central venous pressure. Impressions: - LVEF 60-65%, severe LVH, normal wall motion, grade 1 DD, normal   LV filling pressure, minimally increased aortic velocity without   signficant stenosis, trivial MR, normal LA size, dilated IVC.  CT abdomen and pelvis 09/03/18 Mild bilateral posterior basilar subsegmental atelectasis. Interval placement of new gastrostomy tube with tip and balloon within gastric lumen. Surgical drain is seen in the soft tissues in previous gastrostomy site. Small  amount of gas and stranding is seen in the peritoneal fat anterior and inferior to the stomach which most likely is related to gastrostomy placement. Probable surgical wound seen in right upper quadrant of the anterior abdominal wall. 8.2 x 2.3 cm gas and fluid collection is seen in the anterior abdominal wall which is unchanged compared to prior exam. Possible abscess cannot be excluded. Enlarged fibroid uterus.  PHYSICAL EXAM  Temp:  [98.4 F (36.9 C)-99.4 F (37.4 C)] 98.4 F (36.9 C) (11/03 0000) Pulse Rate:  [74-100] 87 (11/03 0500) Resp:  [23-34] 31 (11/03 0500) BP: (99-145)/(57-81) 123/67 (11/03 0400) SpO2:  [97 %-100 %] 100 % (11/03 0748) Arterial Line BP: (133-194)/(56-78) 194/78 (11/03 0500) FiO2 (%):  [40 %] 40 % (11/03 0748) Weight:  [126.3 kg] 126.3 kg (11/03 0500)  General -  Obese middle-aged African-American lady, not in distress. She has abdominal surgical wound and drainage  from the abdominal wall   Ophthalmologic - fundi not visualized due to noncooperation.  Cardiovascular - not tachycardic currently in the 80s RRR  Neuro - post trach, on vent, off sedation. Eyes open, awake, alert. No ptosis or EOMI, denies diplopia.  PERRL. blinking to visual threat bilaterally. Right gaze nystagmus, can follow and track examiner across the room. Bilateral facial weakness, R>L, corneal, gag and cough present. Able to open mouth and protrude tongue slightly. Quadriplegia except left toe wiggling and left hand pinky slight movement. Grimaces to pain to the BUE. DTR diminished throughout. Muscle tone decreased. Coordination and gait not tested.    ASSESSMENT/PLAN [name] is a 57 y.o. female with history of difficult to control hypertension, insulin-dependent diabetes, obesity, hypothyroidism status post ablation presenting with chest discomfort, right-sided weakness, slurred speech and right facial droop. She did not receive IV t-PA due to late presentation. S/P stent  assisted angioplasty of distal basilar artery.  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 Worsening symptoms with extension of pontine/medullary infarcts with new small bilateral cerebellar infarcts without evidence of stent re-stenosis or occlusion  Resultant quadriplegia, on trach and peg  CT head - No acute intracranial abnormalities.  CTA H&N 08/29/2018 - Extensive atherosclerotic disease in the basilar with focal critical stenosis in the mid to distal basilar. No definite acute thrombus identified. Severe stenosis left posterior cerebral artery  MRI head - Acute nonhemorrhagic infarct involving the left paramedian brainstem.   CTA H&N 08/15/2018 - stent too dense to see BA lumen, but distal flow preserved. New small cerebellar infarcts since brain MRI yesterday.  Severe left P2 segment stenosis, also seen previously.   Repeat MRI - extension of b/l pontine and upper medullary infarcts with new b/l cerebellar infarcts.  2D Echo - EF 60-65%  LDL - 174  HgbA1c - 11.1  VTE prophylaxis - heparin subq  aspirin 81 mg daily prior to admission, now resumed on Brilinta 90 mg bid and ASA 81 mg daily.   Patient counseled to be compliant with her antithrombotic medications  Ongoing aggressive stroke risk factor management  Therapy recommendations:  LTACH  Disposition:  Pending - other hospitals declined transfer as per family requests  Intracranial stenosis  BA mid to distal severe stenosis s/p BA stenting  Left PCA severe stenosis, R PCA moderate stenosis  Left MCA moderate stenosis  Uncontrolled stroke risk factors with HLD, HTN, DM  Non compliance with meds at home  Respiratory failure  S/p trach 08/28/2018  On trach collar then put back on vent for surgeries  Still on vent due to increased WOB  May try weaning if able   CCM on board  Hypertension  Stable on the low side  BP goal 130-150   On po norvasc, losartan,  Coreg 25 bid, HCTZ 25mg  and low dose hydralazine 25mg  tid  Will d/c HCTZ today given soft BP and hypernatremia  Hyperlipidemia  Lipid lowering medication PTA:  Lipitor 20 mg daily  LDL 174, goal < 70  Increase to 80 mg daily  Continue statin at discharge  Diabetes  HgbA1c 11.4, goal < 7.0  Uncontrolled  Hyperglycemia much improved  Lantus 22U bid   Continue basal NovoLog 8U Q4h  SSI  CBG monitoring  Low grade fever with leukocytosis  Tmax 101.6 -> afebrile  Leukocytosis - 14.0->12.1  UA WBC 21-50  CXR unremarkable, improving from prior  On rocephin and flagyl   Blood cultures negative  10/26 ;  Respiratory cultures - mixed flora    CCM and trauma on board  Hypernatremia and elevated Cre/BUN  Na 149->148  Cre 1.31->1.09  Put on NS @ 50  On TF @ 45  On free water 300mg  Q4h  Continue BMP monitoring  Dysphagia s/p PEG complicated by abdominal wall abscess  Continue tube feeding @ 45 cc/h  PEG done 08/15/2018  Removed and replaced dislodged PEG 08/30/2018  CT showed Abdominal wall abscess -s/p open surgical drainage 08/09/2018 persistent foul odor discharge. Reexploration 09/23/2018  Bloody drainage gradually improving  Anemia   Hb 6.8->PRBC->7.1->6.8  Could be due to iron deficiency and acute uterine bleeding and recent surgeries  Iron panel showed iron deficiency  Put on iron pills  PRBC transfusion 1 U Friday 09/16/2018  Will need PRBC transfusion again today  CBC monitoring  Abnormal uterine bleeding, improved  Has been following with GYN  Was on megace in the past  CT abd/pelvis 09/03/18 - enlarged fibroid uterus  resumed megace 80mg  bid  Continue ASA and brilinta for now  Vaginal bleeding much improved  Close CBC monitoring  Other Stroke Risk Factors  Obesity, Body mass index is 47.79 kg/m., recommend weight loss, diet and exercise as appropriate   Other Active Problems  Hypokalemia - on supplement  Plts - 489 ->  553-> 535->479  Hospital day # 24  This patient is critically ill due to worsening brainstem infarct, BA stenosis s/p stenting, hyperglycemia, hypertensive, fever, anemia, abdominal wall abscess s/p drainage, dislodged PEG and hypernatremiaand at significant risk of neurological worsening, death form recurrent stroke, hemorrhagic conversion, CHF, seizure, aspiration, hemorrhagic shock. This patient's care requires constant monitoring of vital signs, hemodynamics, respiratory and cardiac monitoring, review of multiple databases, neurological assessment, discussion with family, other specialists and medical decision making of high complexity. I spent 40 minutes of neurocritical care time in the care of this patient.  Rosalin Hawking, MD PhD Stroke Neurology 09/07/2018 8:49 AM   To contact Stroke Continuity provider, please refer to [email or web address] After hours, contact General Neurology

## 2018-09-07 NOTE — Progress Notes (Signed)
FPTS Interim Progress Note  Appreciate the exception care provided by critical care, neurology, and general surgery for Ms. Wildeman. FPTS will continue to follow. Will be happy to accept care once clinical status improves.  Caroline More, DO 09/07/2018, 6:59 AM PGY-2, [facility] pager [phone]

## 2018-09-07 NOTE — Progress Notes (Signed)
2 Days Post-Op   Subjective/Chief Complaint: Opens eyes on vent   Objective: Vital signs in last 24 hours: Temp:  [98.2 F (36.8 C)-99.4 F (37.4 C)] 98.2 F (36.8 C) (11/03 0936) Pulse Rate:  [74-100] 82 (11/03 0800) Resp:  [16-34] 16 (11/03 0800) BP: (99-140)/(54-76) 109/54 (11/03 0800) SpO2:  [97 %-100 %] 100 % (11/03 0800) Arterial Line BP: (133-196)/(56-80) 196/80 (11/03 0800) FiO2 (%):  [40 %] 40 % (11/03 0800) Weight:  [126.3 kg] 126.3 kg (11/03 0500) Last BM Date: 10/02/2018  Intake/Output from previous day: 11/02 0701 - 11/03 0700 In: 4020 [I.V.:875.2; XN/TZ:0017; IV Piggyback:899.8] Out: 2930 [Urine:2930] Intake/Output this shift: No intake/output data recorded.   abd soft nontender g tube in place, wound just changed but with dressing down not much drainage and per nursing better, no erythema   Lab Results:  Recent Labs    09/06/18 0456 09/07/18 0459  WBC 14.0* 12.1*  HGB 7.1* 6.8*  HCT 25.0* 24.0*  PLT 486* 479*   BMET Recent Labs    09/06/18 0456 09/07/18 0459  NA 149* 148*  K 4.1 3.7  CL 121* 117*  CO2 25 26  GLUCOSE 155* 117*  BUN 55* 51*  CREATININE 1.31* 1.09*  CALCIUM 11.0* 10.9*   PT/INR No results for input(s): LABPROT, INR in the last 72 hours. ABG No results for input(s): PHART, HCO3 in the last 72 hours.  Invalid input(s): PCO2, PO2  Studies/Results: No results found.  Anti-infectives: Anti-infectives (From admission, onward)   Start     Dose/Rate Route Frequency Ordered Stop   09/04/18 1600  cefTRIAXone (ROCEPHIN) 2 g in sodium chloride 0.9 % 100 mL IVPB     2 g 200 mL/hr over 30 Minutes Intravenous Every 24 hours 09/04/18 1437     09/04/18 1600  metroNIDAZOLE (FLAGYL) IVPB 500 mg     500 mg 100 mL/hr over 60 Minutes Intravenous Every 8 hours 09/04/18 1437     09/02/18 0600  vancomycin (VANCOCIN) 500 mg in sodium chloride 0.9 % 100 mL IVPB  Status:  Discontinued     500 mg 100 mL/hr over 60 Minutes Intravenous Every 12  hours 08/12/2018 1619 09/03/18 1025   08/30/18 1600  vancomycin (VANCOCIN) IVPB 750 mg/150 ml premix  Status:  Discontinued     750 mg 150 mL/hr over 60 Minutes Intravenous Every 12 hours 08/30/18 1334 08/12/2018 1619   08/29/18 2300  vancomycin (VANCOCIN) IVPB 750 mg/150 ml premix  Status:  Discontinued     750 mg 150 mL/hr over 60 Minutes Intravenous Every 12 hours 08/29/18 1223 08/29/18 1327   08/29/18 2300  vancomycin (VANCOCIN) IVPB 1000 mg/200 mL premix  Status:  Discontinued     1,000 mg 200 mL/hr over 60 Minutes Intravenous Every 12 hours 08/29/18 1327 08/30/18 1139   08/29/18 1800  cefTAZidime (FORTAZ) 2 g in sodium chloride 0.9 % 100 mL IVPB  Status:  Discontinued     2 g 200 mL/hr over 30 Minutes Intravenous Every 8 hours 08/29/18 1228 09/04/18 1437   08/26/18 2300  vancomycin (VANCOCIN) IVPB 1000 mg/200 mL premix  Status:  Discontinued     1,000 mg 200 mL/hr over 60 Minutes Intravenous Every 12 hours 08/26/18 0923 08/29/18 1223   08/26/18 1000  vancomycin (VANCOCIN) 2,500 mg in sodium chloride 0.9 % 500 mL IVPB     2,500 mg 250 mL/hr over 120 Minutes Intravenous  Once 08/26/18 0923 08/26/18 1900   08/26/18 1000  cefTAZidime (FORTAZ) 1 g in  sodium chloride 0.9 % 100 mL IVPB  Status:  Discontinued     1 g 200 mL/hr over 30 Minutes Intravenous Every 8 hours 08/26/18 0923 08/29/18 1228   08/31/2018 1014  ceFAZolin (ANCEF) 2-4 GM/100ML-% IVPB    Note to Pharmacy:  Roma Kayser  : cabinet override      08/21/2018 1014 08/31/2018 2229      Assessment/Plan: S/p PEG placement 10/21 S/p exploratory laparotomy with open gastrostomy tube and I&D 10/28 S/p takeback for repeat I&D abdominal wall + exlap 11/1 - Doing better today - Continue broad spec IV abx; wet to dry dressings - change twice daily - Appreciate CCM assistance in her care - Continue tube feeds  Rolm Bookbinder 09/07/2018

## 2018-09-08 ENCOUNTER — Encounter (HOSPITAL_COMMUNITY): Payer: Self-pay | Admitting: General Surgery

## 2018-09-08 LAB — GLUCOSE, CAPILLARY
GLUCOSE-CAPILLARY: 116 mg/dL — AB (ref 70–99)
GLUCOSE-CAPILLARY: 136 mg/dL — AB (ref 70–99)
GLUCOSE-CAPILLARY: 143 mg/dL — AB (ref 70–99)
GLUCOSE-CAPILLARY: 96 mg/dL (ref 70–99)
Glucose-Capillary: 102 mg/dL — ABNORMAL HIGH (ref 70–99)
Glucose-Capillary: 120 mg/dL — ABNORMAL HIGH (ref 70–99)
Glucose-Capillary: 97 mg/dL (ref 70–99)

## 2018-09-08 MED ORDER — CHLORHEXIDINE GLUCONATE CLOTH 2 % EX PADS
6.0000 | MEDICATED_PAD | Freq: Every day | CUTANEOUS | Status: DC
  Administered 2018-09-08 – 2018-09-11 (×4): 6 via TOPICAL

## 2018-09-08 MED ORDER — SODIUM CHLORIDE 0.9 % IV SOLN
1.0000 g | Freq: Three times a day (TID) | INTRAVENOUS | Status: AC
  Administered 2018-09-08 – 2018-09-13 (×17): 1 g via INTRAVENOUS
  Filled 2018-09-08 (×17): qty 1

## 2018-09-08 NOTE — Progress Notes (Signed)
STROKE TEAM PROGRESS NOTE   SUBJECTIVE (INTERVAL HISTORY) Pt RN is at bedside. Pt still on vent, no acute neuro change. Pt feels to be upset about her condition. Discussed with Dr. Lamonte Sakai to see if we can start weaning today. Her Hb up to 8.2 today after PRBC transfusion.   OBJECTIVE Vitals:   09/08/18 1200 09/08/18 1300 09/08/18 1400 09/08/18 1501  BP: (!) 149/85 (!) 143/90 (!) 147/82 (!) 147/82  Pulse: 81 92 84 88  Resp: (!) 32 (!) 23 (!) 31 (!) 32  Temp: 99.1 F (37.3 C)   98.6 F (37 C)  TempSrc: Oral     SpO2: 99% 100% 98% 99%  Weight:      Height:        CBC:  Recent Labs  Lab 09/06/18 0456 09/07/18 0459 09/07/18 1834  WBC 14.0* 12.1*  --   NEUTROABS  --  8.9*  --   HGB 7.1* 6.8* 8.2*  HCT 25.0* 24.0* 28.9*  MCV 85.9 86.0  --   PLT 486* 479*  --     Basic Metabolic Panel:  Recent Labs  Lab 09/19/2018 0613 09/06/18 0456 09/07/18 0459  NA 149* 149* 148*  K 3.8 4.1 3.7  CL 119* 121* 117*  CO2 23 25 26   GLUCOSE 110* 155* 117*  BUN 52* 55* 51*  CREATININE 1.28* 1.31* 1.09*  CALCIUM 11.1* 11.0* 10.9*  MG 2.5*  --   --   PHOS 2.5 3.3  --     Lipid Panel:     Component Value Date/Time   CHOL 256 (H) 08/15/2018 0512   TRIG 248 (H) 08/15/2018 0512   HDL 35 (L) 08/15/2018 0512   CHOLHDL 7.3 08/15/2018 0512   VLDL 50 (H) 08/15/2018 0512   LDLCALC 171 (H) 08/15/2018 0512   HgbA1c:  Lab Results  Component Value Date   HGBA1C 11.4 (H) 08/15/2018   Urine Drug Screen:     Component Value Date/Time   LABOPIA NONE DETECTED 08/05/2018 1836   COCAINSCRNUR NONE DETECTED 08/06/2018 1836   LABBENZ NONE DETECTED 08/21/2018 1836   AMPHETMU NONE DETECTED 09/03/2018 1836   THCU NONE DETECTED 08/15/2018 1836   LABBARB NONE DETECTED 08/08/2018 1836    Alcohol Level No results found for: ETH  IMAGING  Ct Angio Head W Or Wo Contrast Ct Angio Neck W Or Wo Contrast 08/15/2018 IMPRESSION:  1. Interval basilar to left proximal PCA stenting. The density of the stent  walls precludes detection of in stent stenosis; there is a degree of wasting at the mid basilar segment. There is flow in the left PCA beyond the stent such that there is presumed stent patency.  2. Known lower pontine infarct. There are new small cerebellar infarcts since brain MRI yesterday.  3. Severe left P2 segment stenosis, also seen previously.  4. Moderate atheromatous narrowing in the proximal left MCA.    Ct Angio Head W Or Wo Contrast Ct Angio Neck W Or Wo Contrast 09/04/2018 IMPRESSION:  1. 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 No definite acute thrombus identified.  2. Severe stenosis left posterior cerebral artery and moderate stenosis right posterior cerebral artery.  3. Mild stenosis left MCA and moderate stenosis left MCA bifurcation.  4. No significant carotid or vertebral artery stenosis in the neck.    Ct Head Wo Contrast 09/01/2018 IMPRESSION:  No acute intracranial abnormalities. Mild white matter changes likely due to small vessel ischemia.  Mr Brain Wo Contrast 08/29/2018 IMPRESSION:  1. Acute nonhemorrhagic infarct involving the left paramedian brainstem. The infarct crosses midline.  2. Other periventricular and subcortical white matter disease is moderately advanced for age. This likely reflects the sequela of chronic microvascular ischemia.  3. Tapering of the dens with prominent soft tissue pannus. 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    Ct Head Code Stroke Wo Contrast 08/30/2018  IMPRESSION:  1. No acute abnormality and no change from earlier today  2. ASPECTS is 10 3.    Cerebral Angiogram 08/08/2018 S/P 4 vessel cerebral arteriogram RT CFA approach. 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 S/P stent assisted angioplasty of distal basilar artery with patency of  80 to 90 %. 2.Approx 70 % stenosis of LT ICA supraclinoid seg   MRI 08/15/18 1. Progressive acute pontine infarct. New patchy bilateral cerebellar infarction. New tiny left thalamic infarcts. 2. Preserved flow void in the stented basilar. 3. left facial/submandibular edema, please correlate with neck exam. Edited result: IMPRESSION: 1. Progressive acute pontine infarct. New patchy bilateral cerebellar infarction. New tiny left thalamic infarcts. 2. Preserved flow void in the stented basilar.  TTE - Left ventricle: The cavity size was normal. There was severe   concentric hypertrophy. Systolic function was normal. The   estimated ejection fraction was in the range of 60% to 65%. Wall   motion was normal; there were no regional wall motion   abnormalities. Doppler parameters are consistent with abnormal   left ventricular relaxation (grade 1 diastolic dysfunction). The   E/e&' ratio is <8, suggesting normal LV filling pressure. - Aortic valve: Trileaflet; mildly calcified leaflets.   Transvalvular velocity was minimally increased. There was no   regurgitation. Mean gradient (S): 12 mm Hg. - Mitral valve: Mildly thickened leaflets . There was trivial   regurgitation. - Left atrium: The atrium was normal in size. - Inferior vena cava: The vessel was dilated. The respirophasic   diameter changes were blunted (< 50%), consistent with elevated   central venous pressure. Impressions: - LVEF 60-65%, severe LVH, normal wall motion, grade 1 DD, normal   LV filling pressure, minimally increased aortic velocity without   signficant stenosis, trivial MR, normal LA size, dilated IVC.  CT abdomen and pelvis 09/03/18 Mild bilateral posterior basilar subsegmental atelectasis. Interval placement of new gastrostomy tube with tip and balloon within gastric lumen. Surgical drain is seen in the soft tissues in previous gastrostomy site. Small amount of gas and stranding is seen in the peritoneal  fat anterior and inferior to the stomach which most likely is related to gastrostomy placement. Probable surgical wound seen in right upper quadrant of the anterior abdominal wall. 8.2 x 2.3 cm gas and fluid collection is seen in the anterior abdominal wall which is unchanged compared to prior exam. Possible abscess cannot be excluded. Enlarged fibroid uterus.  PHYSICAL EXAM  Temp:  [98.2 F (36.8 C)-100.1 F (37.8 C)] 98.6 F (37 C) (11/04 1501) Pulse Rate:  [56-92] 88 (11/04 1501) Resp:  [21-32] 32 (11/04 1501) BP: (113-152)/(71-90) 147/82 (11/04 1501) SpO2:  [97 %-100 %] 99 % (11/04 1501) FiO2 (%):  [40 %] 40 % (11/04 1501) Weight:  [188 kg] 128 kg (11/04 0353)  General -  Obese middle-aged African-American lady, not in distress. She has abdominal surgical wound and drainage from the abdominal wall   Ophthalmologic - fundi not visualized due  to noncooperation.  Cardiovascular - not tachycardic currently in the 80s RRR  Neuro - post trach, on vent, off sedation. Eyes open, awake, alert. No ptosis or EOMI, denies diplopia.  PERRL. blinking to visual threat bilaterally. Right gaze nystagmus, can follow and track examiner across the room. Bilateral facial weakness, R>L, corneal, gag and cough present. Able to open mouth and protrude tongue slightly. Quadriplegia except left toe wiggling and left hand pinky slight movement. Grimaces to pain to the BUE. DTR diminished throughout. Muscle tone decreased. Coordination and gait not tested.    ASSESSMENT/PLAN Andrea [name] is a 58 y.o. female with history of difficult to control hypertension, insulin-dependent diabetes, obesity, hypothyroidism status post ablation presenting with chest discomfort, right-sided weakness, slurred speech and right facial droop. She did not receive IV t-PA due to late presentation. S/P stent assisted angioplasty of distal basilar artery.  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 Worsening symptoms with extension of pontine/medullary infarcts with new small bilateral cerebellar infarcts without evidence of stent re-stenosis or occlusion  Resultant quadriplegia, on trach and peg  CT head - No acute intracranial abnormalities.  CTA H&N 08/06/2018 - Extensive atherosclerotic disease in the basilar with focal critical stenosis in the mid to distal basilar. No definite acute thrombus identified. Severe stenosis left posterior cerebral artery  MRI head - Acute nonhemorrhagic infarct involving the left paramedian brainstem.   CTA H&N 08/15/2018 - stent too dense to see BA lumen, but distal flow preserved. New small cerebellar infarcts since brain MRI yesterday.  Severe left P2 segment stenosis, also seen previously.   Repeat MRI - extension of b/l pontine and upper medullary infarcts with new b/l cerebellar infarcts.  2D Echo - EF 60-65%  LDL - 174  HgbA1c - 11.1  VTE prophylaxis - heparin subq  aspirin 81 mg daily prior to admission, now resumed on Brilinta 90 mg bid and ASA 81 mg daily.   Patient counseled to be compliant with her antithrombotic medications  Ongoing aggressive stroke risk factor management  Therapy recommendations:  LTACH  Disposition:  Pending - other hospitals declined transfer as per family requests  Intracranial stenosis  BA mid to distal severe stenosis s/p BA stenting  Left PCA severe stenosis, R PCA moderate stenosis  Left MCA moderate stenosis  Uncontrolled stroke risk factors with HLD, HTN, DM  Non compliance with meds at home  Respiratory failure  S/p trach 08/13/2018  On trach collar then put back on vent for surgeries  Still on vent due to increased WOB  Will start weaning if able   CCM on board  Hypertension  Stable on the low side  BP goal 130-150   On po norvasc, losartan, Coreg 25 bid, HCTZ 25mg  and low dose hydralazine 25mg  tid  Will d/c HCTZ today given soft BP and  hypernatremia  Hyperlipidemia  Lipid lowering medication PTA:  Lipitor 20 mg daily  LDL 174, goal < 70  Increase to 80 mg daily  Continue statin at discharge  Diabetes  HgbA1c 11.4, goal < 7.0  Uncontrolled at home  Hyperglycemia much improved  Lantus 22U bid   Continue basal NovoLog 8U Q4h  SSI  CBG monitoring  Low grade fever with leukocytosis  Tmax 101.6 -> afebrile  Leukocytosis - 14.0->12.1  UA WBC 21-50  CXR unremarkable, improving from prior  On meropenem now  Blood cultures negative 10/26 ;  Respiratory cultures - mixed flora   Abdominal abscess  culture showed proteus mirabilis  CCM and trauma on board  Hypernatremia and elevated Cre/BUN  Na 149->148  Cre 1.31->1.09  Put on NS @ 50  On TF @ 45  On free water 300mg  Q4h  Continue BMP monitoring  Dysphagia s/p PEG complicated by abdominal wall abscess  Continue tube feeding @ 45 cc/h  PEG done 09/02/2018  Removed and replaced dislodged PEG 08/18/2018  CT showed Abdominal wall abscess - s/p open surgical drainage 08/22/2018 persistent foul odor discharge. Reexploration 10/02/2018  Culture showed peroteus mirabilis  On meropenum now   Anemia   Hb 6.8->PRBC->7.1->6.8->8.2  Could be due to iron deficiency and acute uterine bleeding and recent surgeries  Iron panel showed iron deficiency  Put on iron pills  PRBC transfusion 1 U 09/24/2018 and 2U 09/07/18  CBC monitoring  Abnormal uterine bleeding, improved  Has been following with GYN  Was on megace in the past  CT abd/pelvis 09/03/18 - enlarged fibroid uterus  resumed megace 80mg  bid  Continue ASA and brilinta for now  Vaginal bleeding much improved  Close CBC monitoring  Other Stroke Risk Factors  Obesity, Body mass index is 48.44 kg/m., recommend weight loss, diet and exercise as appropriate   Other Active Problems  Hypokalemia - on supplement  Plts - 489 -> 553-> 535->479  Hospital day # 25  This patient  is critically ill due to worsening brainstem infarct, BA stenosis s/p stenting, hyperglycemia, hypertensive, fever, anemia, abdominal wall abscess s/p drainage, dislodged PEG and hypernatremiaand at significant risk of neurological worsening, death form recurrent stroke, hemorrhagic conversion, CHF, seizure, aspiration, hemorrhagic shock. This patient's care requires constant monitoring of vital signs, hemodynamics, respiratory and cardiac monitoring, review of multiple databases, neurological assessment, discussion with family, other specialists and medical decision making of high complexity. I spent 35 minutes of neurocritical care time in the care of this patient.  Rosalin Hawking, MD PhD Stroke Neurology 09/08/2018 3:45 PM   To contact Stroke Continuity provider, please refer to [email or web address] After hours, contact General Neurology

## 2018-09-08 NOTE — Progress Notes (Signed)
OT Treatment Note  Pt seen as co-treat with PT. Bed used in Egress position to facilitate trunk and head movement. Seen on 40% with RR set at 15; pt RR in 30s during session. Pt mouthing " I want; let me" throughout session. Tearful and apparently upset. Minimal activation of L shoulder adduction and increased ability to hold head upright for short periods of time. Will continue to follow.     09/08/18 1321  OT Visit Information  Last OT Received On 09/08/18  Assistance Needed +2  PT/OT/SLP Co-Evaluation/Treatment Yes  Reason for Co-Treatment Complexity of the patient's impairments (multi-system involvement);For patient/therapist safety  OT goals addressed during session ADL's and self-care;Strengthening/ROM  History of Present Illness 57 yo presented to ED with chest pain noted Rt hemiparesis in ED with acute Left paramedian brainstem infarct. Intubated 10/10 for angioplasty, extubated post procedure and reintubated. Repeat MRI with brainstem infarct extension and bil cerebellar infarcts. Peg/trach with return to vent 08/30/2018. 10/22-29 on trach collar, return to vent 10/29. 10/28 I & D of abdominal wall abscess with placement of penrose drain and G-tube, 11/1 repeat ex lap.  PMHx: HTN, DM, CKD  Precautions  Precautions Fall  Precaution Comments Peg/trach  Pain Assessment  Pain Assessment Faces  Faces Pain Scale 6  Pain Location grimace with head and neck movement, grimace with noxious stimuli all extremities  Pain Descriptors / Indicators Discomfort;Grimacing  Pain Intervention(s) Limited activity within patient's tolerance  Cognition  Arousal/Alertness Awake/alert  Behavior During Therapy WFL for tasks assessed/performed  Overall Cognitive Status Difficult to assess  General Comments pt at times talking over trach and stating "no" or "I want" pt with inconsistent head nods and blinks today. pt trying to mouth words throughout session. Following commands for trunk and head movement as  able  Difficult to assess due to Tracheostomy  Upper Extremity Assessment  Upper Extremity Assessment RUE deficits/detail;LUE deficits/detail  RUE Deficits / Details no active movment;flaccid; inferior subluxation of shoulder  LUE Deficits / Details LUE  - Able to activate shoulder adduction; unable to use functionally  Lower Extremity Assessment  Lower Extremity Assessment Defer to PT evaluation  ADL  General ADL Comments total A at this time  Bed Mobility  Overal bed mobility Needs Assistance  General bed mobility comments utilized foot egress bed positioning to 45 degrees. In sitting pt assisting with pulling trunk forward off of the surface. Performed lateral trunk movement onto forearms right and left with max assist. Pt able to hold head off surface without assist, max assist to rotate neck bil with cues for assist  Balance  Overall balance assessment Needs assistance  Sitting-balance support Feet supported;Bilateral upper extremity supported  Sitting balance-Leahy Scale Zero  Sitting balance - Comments pt assisting with leaning forward in chair position and holding head control  Vision- Assessment  Vision Assessment? Vision impaired- to be further tested in functional context  Transfers  General transfer comment did not attempt this session  Exercises  Exercises Other exercises;General Upper Extremity  General Exercises - Upper Extremity  Shoulder Flexion PROM;Both;10 reps;Supine  Elbow Flexion PROM;Both;10 reps;Supine  Shoulder ABduction PROM;Both;10 reps;Supine  Shoulder ADduction PROM;Both;10 reps;Supine  Elbow Extension PROM;Both;10 reps;Supine  Wrist Flexion PROM;Both;10 reps;Supine  Wrist Extension PROM;Both;10 reps;Supine  Digit Composite Flexion PROM;Both;5 reps;Supine  Composite Extension PROM;Both;5 reps;Supine  OT - End of Session  Activity Tolerance Patient tolerated treatment well  Patient left in bed;with call bell/phone within reach;with SCD's  reapplied (modified chair position)  Nurse Communication Mobility status;Other (comment)  OT Assessment/Plan  OT Plan Discharge plan remains appropriate  OT Visit Diagnosis Other abnormalities of gait and mobility (R26.89);Muscle weakness (generalized) (M62.81);Low vision, both eyes (H54.2);Feeding difficulties (R63.3);Other symptoms and signs involving cognitive function;Hemiplegia and hemiparesis;Pain  Hemiplegia - Right/Left Right (Left)  Hemiplegia - dominant/non-dominant Non-Dominant  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 1  Help from another person taking care of personal grooming? 1  Help from another person toileting, which includes using toliet, bedpan, or urinal? 1  Help from another person bathing (including washing, rinsing, drying)? 1  Help from another person to put on and taking off regular upper body clothing? 1  Help from another person to put on and taking off regular lower body clothing? 1  6 Click Score 6  ADL G Code Conversion CN  OT Goal Progression  Progress towards OT goals Not progressing toward goals - comment;OT to reassess next treatment  Acute Rehab OT Goals  Patient Stated Goal per family for pt to get stronger  OT Goal Formulation With patient/family  Time For Goal Achievement 09/11/18  Potential to Achieve Goals Fair  ADL Goals  Pt Will Perform Grooming with max assist;bed level  Pt Will Perform Upper Body Bathing with max assist;bed level  Pt/caregiver will Perform Home Exercise Program Increased ROM;With written HEP provided;Both right and left upper extremity  Additional ADL Goal #1 Pt will sit EOB with +2 Max A x 10 min in preparation  for functional activities.  OT Time Calculation  OT Start Time (ACUTE ONLY) 1232  OT Stop Time (ACUTE ONLY) 1305  OT Time Calculation (min) 33 min  OT General Charges  $OT Visit 1 Visit  OT Treatments  $Therapeutic Activity 8-22 mins  Maurie Boettcher, OT/L   Acute OT Clinical Specialist [place] Pager [phone] Office [phone]

## 2018-09-08 NOTE — Progress Notes (Signed)
NAME:  Andrea [name], MRN:  [id number], DOB:  July 30, 1961, LOS: 40 ADMISSION DATE:  08/17/2018, CONSULTATION DATE:  08/29/2018 REFERRING MD:  Dr. Rory Percy, CHIEF COMPLAINT:  CP/ Acute CVA  Brief History   78 yoF w/poorly controlled HTN and IDDM presenting with CP and left arm pain w/BP 220/117. Initial CTH neg.  Cardiac workup negative for acute event thus far, cardiology following.  Code stroke initiated for R hemiplegia, dysarthria and right facial droop.  Out of window for TPA.  Requiring cleviprex for BP control.  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  Evolving pontine stroke on MRI. Intubated 10/10 for neuro IR s/p stenting and angioplasty with 80-90% patency.  Extubated post procedure but due to insufficency placed on BiPAP and brought to ICU.  Required reintubation and ultimately trach (10/21 JY).  Peg tube placed 10/21 by CCS.  Taken to OR 10/28 for open gastrostomy and I&D of abdominal wall abscess. 11/1 taken back to OR for repeat ex-lap.     Past Medical History  HTN, IDDM, hypothyroidism s/p ablation  Significant Hospital Events   10/9 present to [facility] ED -transferred to [facility] 10/9 basilar artery stenting 10/9 intubated urgently 6h post procedure for inability to protect airway. 10/10 CTA for declining exam - evolving pontine infarct, stent deemed to be patent 10/14 Brillinta stopped and ASA/ heparin started to in anticipation of tracheostomy on Monday. 10/14 through 10/19: Neuro exam has been stable sodium increasing in spite of free water via tube, providing supportive care awaiting trach 10/20: Sodium down to 151 from 156 clinically looks the same 10/21: Sodium down to 148, sputum culture sent for purulent tracheal secretions, awaiting trach and PEG.  Purulent bronchial secretions from right upper lobe sent for BAL 10/22: Tracheostomy and PEG completed on the 21st tolerated well.  Spiking low-grade fever white cells continued to climb starting empiric  vancomycin and ceftaz.  Sodium improved down to 146.  Blood pressure improving but still not at goal added hydrochlorothiazide.  Resumed Brilinta.  10/23: Sodium normal.  Backing down on free water.  Glycemic control challenging, increased basal NovoLog in addition to Lantus and sliding scale.  Still spiking fever, white blood cell count climbing, sending blood cultures.  Foley catheter being placed for pressure ulcer caused by pure wick device 10/27 Foul smelling dark trach secretions, PEG secretions  10/28 to OR for ex lap due to PEG dislodgement.  PEG replaced 10/29 remains on MV 2/2 tachypnea; family meeting 10/30 no weaning 2/2 to ongoing tachypnea; foul smelling drainage from abd site 10/31 no weaning, abx changed, family meeting  Consults: date of consult/date signed off & final recs:  Cards 10/10 Neurology 10/10 PCCM 10/10 CCS 10/17  Procedures (surgical and bedside):  10/10 Neuro IR >> s/p stent and angioplasty of distal basilar artery  10/10 ETT for procedure 10/10 ETT (reintubated) >> removed 10/21: Size 6 tracheostomy placed by Dr. Nelda Marseille 10/26 changed to #4 trach cuffless  10/28 Ex lap with replacement of gastrostomy tube due to dislodgement 11/1 repeat Ex-lap with I&D of abdominal wall abscess   Significant Diagnostic Tests:  10/10  CTH >>  No acute intracranial abnormalities. 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 No definite acute thrombus identified. Severe stenosis left posterior cerebral artery  and moderate stenosis right posterior cerebral artery. Mild stenosis left MCA and moderate stenosis left MCA bifurcation. No significant carotid or vertebral artery stenosis in the neck. 10/10 MRI >> evolving pontine infarct. 10/11 ECHO >> normal LVSF with  severe LVH. 10/28 CT A / P > dislodged PEG tube outside of the peritoneum and not within the stomach.  Cholelithiasis. No bowel obstruction.  No hydronephrosis.  10/30 CT abd/pelvis >> Mild bilateral atelectasis.  Interval placement of new gastrostomy tube with tip and balloon within gastric lumen. Surgical drain is seen in the soft tissues in previous gastrostomy site. Small amount of gas and stranding is seen in the peritoneal fat anterior and inferior to the stomach which most likely is related to gastrostomy placement. Probable surgical wound seen in right upper quadrant of the anterior abdominal wall.  8.2 x 2.3 cm gas and fluid collection is seen in the anterior abdominal wall which is unchanged compared to prior exam. Possible abscess cannot be excluded.  Enlarged fibroid uterus.  Micro Data:  10/10 MRSA PCR >> negative 10/21 Sputum culture >> nl resp flora  10/23 Blood cultures x 2 >> negative 10/26 Blood cultures x 2 >> neg 10/28 MRSA PCR >> neg 10/27 Sputum culture >> normal flora  Antimicrobials:  10/10 cefazolin preop 10/22 Vancomycin >> 10/30 10/22 Ceftaz >> 10/31 10/31 ceftriaxone >> 11/4 10/31 flagyl >> 11/4 11/4 meropenem >>   Subjective:  No new issues reported Currently on PSV and trolerating   Objective   Blood pressure (!) 147/82, pulse 84, temperature 99.1 F (37.3 C), temperature source Oral, resp. rate (!) 31, height _0  (1.626 m), weight 128 kg, last menstrual period 07/26/2018, SpO2 98 %.    Vent Mode: PRVC FiO2 (%):  [40 %] 40 % Set Rate:  [15 bmp] 15 bmp Vt Set:  [420 mL] 420 mL PEEP:  [5 cmH20] 5 cmH20 Pressure Support:  [15 cmH20] 15 cmH20 Plateau Pressure:  [14 cmH20-16 cmH20] 14 cmH20   Intake/Output Summary (Last 24 hours) at 09/08/2018 1417 Last data filed at 09/08/2018 1400 Gross per 24 hour  Intake 4662.7 ml  Output 2125 ml  Net 2537.7 ml   Filed Weights   09/06/18 0500 09/07/18 0500 09/08/18 0353  Weight: 128.3 kg 126.3 kg 128 kg     Examination:  General: Obese ill-appearing woman, ventilated HEENT: Trach in good position, oropharynx clear, copious oral secretions, some resolving white plaquing on the tongue Lungs: No wheezing, bilateral rhonchi CV: Regular, distant, no murmur Abdomen: Soft, some tenderness in the left lower and left upper quadrant.  There is a left upper quadrant incisional wound that is packed and dressed without any purulence or odor Extremities: Trace lower extremity edema Skin: No rash Neuro: Awake, nods to questions, follows commands   Assessment & Plan:   Respiratory failure/ hypoxemia due to inability to protect airway from cva -Expected airway difficulties with location of stroke s/p tracheostomy 10/21 - 11/1 CXR reviewed- stable trach, improving RLL atelectasis  P:  Tolerating pressure support and I think we should be able to transition back to ATC soon.  We will try this today and see how long she can tolerate. No plans to downsize trach or to change to cuffless at this time.  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  Ceftriaxone and Flagyl changed to meropenem on 11/4.  She will need a longer course in order to get adequate coverage.  Hypertension P: Carvedilol, hydralazine, Norvasc, HCTZ PRN anti-HTN available for SBP >180   Hypernatremia  P: Follow intermittent BMP Free water as ordered  AKI - stable sCr/ UOP, improving P:  Follow BMP, urine output  Dislodged PEG tube with  associated abdominal wall absecess - s/p ex lap with I&D and replacement of PEG 10/28, 11/1 repeat ex-lap  - purulent drainage noted 10/31, repeat CT abd grossly unchanged in fluid collections P: Status post wound cleanout 11/2 Antibiotics changed to meropenem 11/4 as above  Normocytic anemia - Hgb 7 -> 6.8 - no obvious source of bleeding, possible post-op blood loss  P:  Follow intermittent CBC No current evidence for active bleeding  Hypercalcemia  P:  Follow BMP, calcium  White plaques on tongue:  Nystatin as ordered  Disposition / Summary of Today's Plan 09/08/18   Retry ATC as tolerated Antibiotics changed to meropenem  Best Practice  Diet: TFs Pain/Anxiety/Delirium protocol (if indicated): RASS goal 0  VAP protocol (if indicated): Ordered DVT prophylaxis: SCDs  GI prophylaxis: Famotidine Hyperglycemia protocol: SSI, lantus Mobility: Bedrest Code Status: Full  Family: ongoing with PMT    Baltazar Apo, MD, PhD 09/08/2018, 2:28 PM [facility] [phone] or if no answer [phone]

## 2018-09-08 NOTE — Progress Notes (Signed)
Daily Progress Note   Patient Name: Andrea [name]       Date: 09/08/2018 DOB: 18-Mar-1961  Age: 57 y.o. MRN#: [id number] Attending Physician: Rosalin Hawking, MD Primary Care Physician: Danna Hefty, DO Admit Date: 08/08/2018  Reason for Consultation/Follow-up: Establishing goals of care  Subjective: Noted patient returned to OR for additional wound I&D. Continues to be vent dependent.  I observed patient during PT/OT today. She appeared to be in pain, grimacing. She would mouth "I want", at one point she seemed to be saying "I want to go away", also saying "no" but could not otherwise communicate her needs. Therapists attempted to communicate with eye blinks and nods, but these were inconsistent. Patient's RR increased greatly during therapy. Noted during weaning attempt per nursing she experienced increased RR and HR.  I called patient's sister Andrea [name] for continued followup and GOC. Relayed to [place] my observances of PT/OT session. Andrea [name] states family has concerns that "something is being hidden from them". Discussed Andrea [name]'s concerns, gave emotional support.  Andrea [name] states she doesn't understand why other facilities will not accept patient- I discussed with her the process of transferring patient's and how one facility will not accept a patient if they do not feel they can provide more or better care than what the patient is receiving in their current place. Andrea [name] stated family is upset about patient's PEG being dislodged and creating an abscess. She feels this "should not have happened", and that patient's set back in progress is due to PEG coming out.  Gave emotional support and continued attempts to clarify and help in understanding of medical information.  Andrea [name] reiterated concerns that family  members were hearing "different things from different providers". I discussed with her that is the reason we had discussed on Thursday having only one family to communicate with medical providers.  I also encouraged Andrea [name] to be prepared that the PEG tube becoming dislodged may not be the last "setback" that patient has, and while we hope for the best- it is important to discuss and prepare for the worse.    ROS  Length of Stay: 25  Current Medications: Scheduled Meds:  . sodium chloride   Intravenous Once  . amLODipine  10 mg Per Tube Daily  . aspirin  81 mg Per Tube Daily  . atorvastatin  80 mg Per Tube q1800  . carvedilol  25 mg Per Tube BID WC  . chlorhexidine gluconate (MEDLINE KIT)  15 mL Mouth Rinse BID  . Chlorhexidine Gluconate Cloth  6 each Topical Daily  . docusate  100 mg Per Tube Daily  . feeding supplement (PRO-STAT SUGAR FREE 64)  60 mL Per Tube BID  . ferrous sulfate  300 mg Per Tube TID  . free water  300 mL Per Tube Q4H  . hydrALAZINE  25 mg Per Tube Q8H  . insulin aspart  0-20 Units Subcutaneous Q4H  . insulin aspart  8 Units Subcutaneous Q4H  . insulin glargine  22 Units Subcutaneous BID  . liver oil-zinc oxide   Topical QID  . mouth rinse  15 mL Mouth Rinse 10 times per day  . multivitamin  15 mL Per Tube Daily  . nystatin  5 mL Oral QID  . pantoprazole sodium  40 mg Per Tube Daily  . thiamine injection  100 mg Intravenous Daily  . ticagrelor  90 mg Per Tube BID    Continuous Infusions: . sodium chloride 120 mL/hr at 09/15/2018 1347  . sodium chloride 50 mL/hr at 09/08/18 1800  . feeding supplement (GLUCERNA 1.2 CAL) 1,000 mL (09/08/18 1501)  . lactated ringers    . meropenem (MERREM) IV Stopped (09/08/18 1436)    PRN Meds: sodium chloride, acetaminophen (TYLENOL) oral liquid 160 mg/5 mL, albuterol, bisacodyl, diphenhydrAMINE-zinc acetate, fentaNYL (SUBLIMAZE) injection, hydrALAZINE, HYDROcodone-acetaminophen, labetalol, ondansetron (ZOFRAN) IV,  sennosides  Physical Exam          Vital Signs: BP (!) 159/87   Pulse 89   Temp 98.6 F (37 C)   Resp (!) 26   Ht 5' 4"  (1.626 m)   Wt 128 kg   LMP 07/26/2018   SpO2 99%   BMI 48.44 kg/m  SpO2: SpO2: 99 % O2 Device: O2 Device: Ventilator O2 Flow Rate: O2 Flow Rate (L/min): 8 L/min  Intake/output summary:   Intake/Output Summary (Last 24 hours) at 09/08/2018 1953 Last data filed at 09/08/2018 1800 Gross per 24 hour  Intake 4157.26 ml  Output 2025 ml  Net 2132.26 ml   LBM: Last BM Date: 09/08/18 Baseline Weight: Weight: 122.5 kg Most recent weight: Weight: 128 kg       Palliative Assessment/Data: PPS: 10%      Patient Active Problem List   Diagnosis Date Noted  . Goals of care, counseling/discussion   . Palliative care by specialist   . Advanced care planning/counseling discussion   . Sepsis with acute respiratory failure without septic shock ([place])   . Acute respiratory failure ([place])   . Tracheostomy present ([place])   . Tracheostomy status ([place])   . Hypertensive urgency 08/22/2018  . CKD (chronic kidney disease), stage III ([place]) 08/07/2018  . Acute ischemic stroke ([place]) 09/02/2018  . Basilar artery stenosis with infarction ([place]) 08/12/2018  . Respiratory insufficiency   . Low back pain 01/29/2017  . Cholelithiasis without cholecystitis 01/18/2017  . Anemia, iron deficiency 01/17/2017  . Abdominal pain   . Hyperglycemia   . Increased anion gap metabolic acidosis   . Weight loss 04/09/2016  . Headache 04/09/2016  . Fibroids 04/14/2014  . Abnormal uterine bleeding 04/14/2014  . Unequal leg length 04/16/2013  . Lightheadedness 12/17/2012  . Chest pain 12/14/2012  . Morbid obesity ([place]) 12/29/2011  . High cholesterol   . 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 ANXIETY 02/20/2010  . GERD 01/30/2010  . THYROID NODULE 01/17/2010  .  THYROMEGALY 01/16/2010  . DEPRESSION 01/16/2010  . Insulin-requiring or dependent type II diabetes mellitus ([place]) 09/18/2007   . Essential hypertension 09/18/2007    Palliative Care Assessment & Plan   Patient Profile: 57 y.o. female  with past medical history of diabetes, HTN, heart failure, CKD, anxiety, depression, hyperthyroid admitted on 08/27/2018 with symptoms of slurring speech, R facial droop, difficulty breathing. Workup eventually revealed evolving pontine stroke, occluded basilar artery (underwent angiogram with stenting). Post-op she remained hypertensive and followup CT showed progressing infarction into her brainstem and new cerebellar infarcts- she required intubation with eventual trach and PEG on 10/21. She was progressing with therapies and off of vent for a few days, however, began to decline again with lethargy, fevers. Purulent drainage from her PEG noted around 10/28. CT scan showed dislodged PEG tube with abdominal wall thickening and air, but no fluid collection or abscess, however, patient was very tender and there was foul smelling drainage from the wound. She went to OR 10/28 for I&D and replacement of G-tube. Op note shows that G-tube tip was found to be in the subcutaneous tissue, penrose drain was placed into a subcutaneous abscess. Now post op, remains on vent support. Minimal progression in PT, less participative. Palliative medicine consulted for [place].   Assessment/Recommendations/Plan   Family continues to desire full scope care  Will continue to follow and provide support   Goals of Care and Additional Recommendations:  Limitations on Scope of Treatment: Full Scope Treatment  Code Status:  DNR  Prognosis:   Unable to determine  Discharge Planning:  To Be Determined  Care plan was discussed with patient's sister, Andrea [name] and patient's RN.  Thank you for allowing the Palliative Medicine Team to assist in the care of this patient.   Time In: 1330 Time Out: 1405 Total Time 35 mins Prolonged Time Billed no      Greater than 50%  of this time was spent counseling and  coordinating care related to the above assessment and plan.  Mariana Kaufman, AGNP-C Palliative Medicine   Please contact Palliative Medicine Team phone at [phone] for questions and concerns.

## 2018-09-08 NOTE — Progress Notes (Signed)
Physical Therapy Treatment Patient Details Name: Andrea [name] MRN: [id number] DOB: [birth year] Today's Date: 09/08/2018    History of Present Illness 57 yo presented to ED with chest pain noted Rt hemiparesis in ED with acute Left paramedian brainstem infarct. Intubated 10/10 for angioplasty, extubated post procedure and reintubated. Repeat MRI with brainstem infarct extension and bil cerebellar infarcts. Peg/trach with return to vent 08/21/2018. 10/22-29 on trach collar, return to vent 10/29. 10/28 I & D of abdominal wall abscess with placement of penrose drain and G-tube, 11/1 repeat ex lap.  PMHx: HTN, DM, CKD    PT Comments    Pt with RR 32-38 during session breathing over vent, talking over vent a few times and clearly stating "no" over vent and "I want" during session. Unable to clearly state what pt's complete thought was. Pt with improved trunk control able to assist with coming off surface in chair position but no noted movement of bil LE and only limited shoulder adduction on left with OT. Pt in chair position end of session with RN aware. Provided ROM bil LE within available ROM and as able given pt size. Will continue to follow with palliative following for goals of care and will work within [employer].  PRVC 40% with rate 15 BP 149/80   Follow Up Recommendations  [facility] bed;Wheelchair (measurements PT);Other (comment)    Recommendations for Other Services       Precautions / Restrictions Precautions Precaution Comments: Peg/trach    Mobility  Bed Mobility Overal bed mobility: Needs Assistance Bed Mobility: Supine to Sit           General bed mobility comments: utilized foot egress bed positioning to 45 degrees. In sitting pt assisting with pulling trunk forward off of the surface. Performed lateral trunk movement onto forearms right and left with max assist. Pt able to hold head off surface without assist, max assist to rotate neck  bil with cues for assist  Transfers                 General transfer comment: did not attempt this session  Ambulation/Gait             General Gait Details: unable   Stairs             Wheelchair Mobility    Modified Rankin (Stroke Patients Only) Modified Rankin (Stroke Patients Only) Pre-Morbid Rankin Score: No symptoms Modified Rankin: Severe disability     Balance Overall balance assessment: Needs assistance Sitting-balance support: Feet supported;Bilateral upper extremity supported Sitting balance-Leahy Scale: Zero Sitting balance - Comments: pt assisting with leaning forward in chair position and holding head control                                    Cognition Arousal/Alertness: Awake/alert   Overall Cognitive Status: Difficult to assess Area of Impairment: Attention                   Current Attention Level: Sustained     Safety/Judgement: Decreased awareness of deficits     General Comments: pt at times talking over trach and stating "no" or "I want" pt with inconsistent head nods and blinks today. pt trying to mouth words throughout session. Following commands for trunk and head movement as able      Exercises General Exercises - Lower Extremity Ankle Circles/Pumps: PROM;10 reps;Both;Supine  Short Arc Quad: PROM;10 reps;Seated;Both Heel Slides: PROM;10 reps;Supine;Both Hip ABduction/ADduction: PROM;10 reps;Seated;Both Hip Flexion/Marching: PROM;5 reps;Seated;Both    General Comments        Pertinent Vitals/Pain Pain Score: 4  Pain Location: grimace with head and neck movement, grimace with noxious stimuli all extremities Pain Intervention(s): Repositioned;Monitored during session    Home Living                      Prior Function            PT Goals (current goals can now be found in the care plan section) Acute Rehab PT Goals Time For Goal Achievement: 09/22/18 Potential to Achieve Goals:  Poor Progress towards PT goals: Progressing toward goals    Frequency    Min 2X/week      PT Plan Current plan remains appropriate    Co-evaluation PT/OT/SLP Co-Evaluation/Treatment: Yes Reason for Co-Treatment: Complexity of the patient's impairments (multi-system involvement);For patient/therapist safety PT goals addressed during session: Mobility/safety with mobility;Balance;Strengthening/ROM        AM-PAC PT "6 Clicks" Daily Activity  Outcome Measure  Difficulty turning over in bed (including adjusting bedclothes, sheets and blankets)?: Unable Difficulty moving from lying on back to sitting on the side of the bed? : Unable Difficulty sitting down on and standing up from a chair with arms (e.g., wheelchair, bedside commode, etc,.)?: Unable Help needed moving to and from a bed to chair (including a wheelchair)?: Total Help needed walking in hospital room?: Total Help needed climbing 3-5 steps with a railing? : Total 6 Click Score: 6    End of Session Equipment Utilized During Treatment: Other (comment)(vent) Activity Tolerance: Patient tolerated treatment well Patient left: in bed;with call bell/phone within reach;with bed alarm set Nurse Communication: Mobility status;Need for lift equipment PT Visit Diagnosis: Other abnormalities of gait and mobility (R26.89);Muscle weakness (generalized) (M62.81);Other symptoms and signs involving the nervous system (R29.898);Hemiplegia and hemiparesis Hemiplegia - Right/Left: Right Hemiplegia - dominant/non-dominant: Dominant Hemiplegia - caused by: Cerebral infarction     Time: 1225-1313 PT Time Calculation (min) (ACUTE ONLY): 48 min  Charges:  $Therapeutic Activity: 8-22 mins                     [place], PT Acute Rehabilitation Services Pager: [phone] Office: [phone]    Yulieth Carrender B Corazon Nickolas 09/08/2018, 1:25 PM

## 2018-09-08 NOTE — Progress Notes (Signed)
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 Hulen Skains    LOS: 25 days    Subjective: CC: PEG  Pt with trach on vent. Nonverbal. She is awake and alert. Endorses some abdominal discomfort. No family at bedside.   Objective: Vital signs in last 24 hours: Temp:  [98.2 F (36.8 C)-100.1 F (37.8 C)] 100.1 F (37.8 C) (11/04 0400) Pulse Rate:  [56-88] 85 (11/04 0314) Resp:  [20-30] 25 (11/04 0314) BP: (95-143)/(50-83) 133/83 (11/04 0200) SpO2:  [98 %-100 %] 100 % (11/04 0314) Arterial Line BP: (177-193)/(77-80) 177/77 (11/03 0936) FiO2 (%):  [40 %] 40 % (11/04 0314) Weight:  [829 kg] 128 kg (11/04 0353) Last BM Date: 09/07/18  Intake/Output from previous day: 11/03 0701 - 11/04 0700 In: 4966.9 [I.V.:967; Blood:630; NG/GT:2970; IV Piggyback:400] Out: 2975 [Urine:2975] Intake/Output this shift: No intake/output data recorded.  PE: Gen:  Alert, NAD, pleasant, cooperative Pulm:  Trach on vent Abd: Soft, obese, not distended, +BS, PEG in place. Incisions are packed and are without surrounding erythema, no purulent drainage noted. Skin: warm and dry   Anti-infectives: Anti-infectives (From admission, onward)   Start     Dose/Rate Route Frequency Ordered Stop   09/04/18 1600  cefTRIAXone (ROCEPHIN) 2 g in sodium chloride 0.9 % 100 mL IVPB     2 g 200 mL/hr over 30 Minutes  Intravenous Every 24 hours 09/04/18 1437     09/04/18 1600  metroNIDAZOLE (FLAGYL) IVPB 500 mg     500 mg 100 mL/hr over 60 Minutes Intravenous Every 8 hours 09/04/18 1437     09/02/18 0600  vancomycin (VANCOCIN) 500 mg in sodium chloride 0.9 % 100 mL IVPB  Status:  Discontinued     500 mg 100 mL/hr over 60 Minutes Intravenous Every 12 hours 08/31/2018 1619 09/03/18 1025   08/30/18 1600  vancomycin (VANCOCIN) IVPB 750 mg/150 ml premix  Status:  Discontinued     750 mg 150 mL/hr over 60 Minutes Intravenous Every 12 hours 08/30/18 1334 09/04/2018 1619   08/29/18 2300  vancomycin (VANCOCIN) IVPB 750 mg/150 ml premix  Status:  Discontinued     750 mg 150 mL/hr over 60 Minutes Intravenous Every 12 hours 08/29/18 1223 08/29/18 1327   08/29/18 2300  vancomycin (VANCOCIN) IVPB 1000 mg/200 mL premix  Status:  Discontinued     1,000 mg 200 mL/hr over 60 Minutes Intravenous Every 12 hours 08/29/18 1327 08/30/18 1139   08/29/18 1800  cefTAZidime (FORTAZ) 2 g in sodium chloride 0.9 % 100 mL IVPB  Status:  Discontinued     2 g 200 mL/hr over 30 Minutes Intravenous Every 8 hours 08/29/18 1228 09/04/18 1437   08/26/18 2300  vancomycin (VANCOCIN) IVPB 1000 mg/200 mL premix  Status:  Discontinued     1,000 mg 200 mL/hr over 60 Minutes Intravenous Every 12 hours 08/26/18 0923 08/29/18 1223   08/26/18 1000  vancomycin (  VANCOCIN) 2,500 mg in sodium chloride 0.9 % 500 mL IVPB     2,500 mg 250 mL/hr over 120 Minutes Intravenous  Once 08/26/18 0923 08/26/18 1900   08/26/18 1000  cefTAZidime (FORTAZ) 1 g in sodium chloride 0.9 % 100 mL IVPB  Status:  Discontinued     1 g 200 mL/hr over 30 Minutes Intravenous Every 8 hours 08/26/18 0923 08/29/18 1228   09/02/2018 1014  ceFAZolin (ANCEF) 2-4 GM/100ML-% IVPB    Note to Pharmacy:  Roma Kayser  : cabinet override      08/19/2018 1014 08/24/2018 2229      Lab Results:  Recent Labs    09/06/18 0456 09/07/18 0459 09/07/18 1834  WBC 14.0* 12.1*  --   HGB 7.1*  6.8* 8.2*  HCT 25.0* 24.0* 28.9*  PLT 486* 479*  --    BMET Recent Labs    09/06/18 0456 09/07/18 0459  NA 149* 148*  K 4.1 3.7  CL 121* 117*  CO2 25 26  GLUCOSE 155* 117*  BUN 55* 51*  CREATININE 1.31* 1.09*  CALCIUM 11.0* 10.9*   PT/INR No results for input(s): LABPROT, INR in the last 72 hours. CMP     Component Value Date/Time   NA 148 (H) 09/07/2018 0459   K 3.7 09/07/2018 0459   CL 117 (H) 09/07/2018 0459   CO2 26 09/07/2018 0459   GLUCOSE 117 (H) 09/07/2018 0459   BUN 51 (H) 09/07/2018 0459   CREATININE 1.09 (H) 09/07/2018 0459   CREATININE 0.91 04/09/2016 1213   CALCIUM 10.9 (H) 09/07/2018 0459   PROT 7.8 01/23/2018 1352   ALBUMIN 1.3 (L) 09/06/2018 0456   AST 14 (L) 01/23/2018 1352   ALT 11 (L) 01/23/2018 1352   ALKPHOS 92 01/23/2018 1352   BILITOT 0.8 01/23/2018 1352   GFRNONAA 55 (L) 09/07/2018 0459   GFRNONAA 71 04/09/2016 1213   GFRAA >60 09/07/2018 0459   GFRAA 82 04/09/2016 1213   Lipase     Component Value Date/Time   LIPASE 41 01/23/2018 1352    Studies/Results: No results found.    Kalman Drape , [facility] 09/08/2018, 8:39 AM  Pager: [phone] Mon-Wed, Friday 7:00am-4:30pm Thurs 7am-11:30am  Consults: [phone]

## 2018-09-08 NOTE — Progress Notes (Signed)
FPTS Interim Progress Note  Appreciate the exceptional care provided by critical care, neurology, and general surgery for Andrea [name]. FPTS will continue to follow. Will be happy to accept care once clinical status improves.  O: BP (!) 152/90   Pulse 84   Temp 100.1 F (37.8 C) (Axillary)   Resp (!) 28   Ht 5\' 4"  (1.626 m)   Wt 128 kg   LMP 07/26/2018   SpO2 97%   BMI 48.44 kg/m     Richarda Osmond, DO 09/08/2018, 11:47 AM PGY-1, [facility] pager [phone]

## 2018-09-09 LAB — BPAM RBC
BLOOD PRODUCT EXPIRATION DATE: 201911142359
BLOOD PRODUCT EXPIRATION DATE: 201911222359
Blood Product Expiration Date: 201911222359
Blood Product Expiration Date: 201911222359
ISSUE DATE / TIME: 201911011327
ISSUE DATE / TIME: 201911011437
ISSUE DATE / TIME: 201911030929
ISSUE DATE / TIME: 201911031141
UNIT TYPE AND RH: 7300
UNIT TYPE AND RH: 7300
Unit Type and Rh: 7300
Unit Type and Rh: 7300

## 2018-09-09 LAB — TYPE AND SCREEN
ABO/RH(D): B POS
Antibody Screen: NEGATIVE
Unit division: 0
Unit division: 0
Unit division: 0
Unit division: 0

## 2018-09-09 LAB — CBC
HEMATOCRIT: 30.4 % — AB (ref 36.0–46.0)
Hemoglobin: 8.4 g/dL — ABNORMAL LOW (ref 12.0–15.0)
MCH: 24.6 pg — ABNORMAL LOW (ref 26.0–34.0)
MCHC: 27.6 g/dL — AB (ref 30.0–36.0)
MCV: 88.9 fL (ref 80.0–100.0)
Platelets: 492 10*3/uL — ABNORMAL HIGH (ref 150–400)
RBC: 3.42 MIL/uL — ABNORMAL LOW (ref 3.87–5.11)
RDW: 17.3 % — AB (ref 11.5–15.5)
WBC: 9.9 10*3/uL (ref 4.0–10.5)
nRBC: 1.1 % — ABNORMAL HIGH (ref 0.0–0.2)

## 2018-09-09 LAB — GLUCOSE, CAPILLARY
GLUCOSE-CAPILLARY: 100 mg/dL — AB (ref 70–99)
GLUCOSE-CAPILLARY: 132 mg/dL — AB (ref 70–99)
GLUCOSE-CAPILLARY: 112 mg/dL — AB (ref 70–99)
Glucose-Capillary: 95 mg/dL (ref 70–99)
Glucose-Capillary: 102 mg/dL — ABNORMAL HIGH (ref 70–99)
Glucose-Capillary: 106 mg/dL — ABNORMAL HIGH (ref 70–99)

## 2018-09-09 LAB — BASIC METABOLIC PANEL
ANION GAP: 5 (ref 5–15)
BUN: 41 mg/dL — ABNORMAL HIGH (ref 6–20)
CALCIUM: 10.4 mg/dL — AB (ref 8.9–10.3)
CO2: 25 mmol/L (ref 22–32)
CREATININE: 0.86 mg/dL (ref 0.44–1.00)
Chloride: 120 mmol/L — ABNORMAL HIGH (ref 98–111)
GFR calc Af Amer: >60 mL/min (ref >60)
GFR calc non Af Amer: >60 mL/min (ref >60)
GLUCOSE: 104 mg/dL — AB (ref 70–99)
Potassium: 3.9 mmol/L (ref 3.5–5.1)
Sodium: 150 mmol/L — ABNORMAL HIGH (ref 135–145)

## 2018-09-09 MED ORDER — VITAMIN B-1 100 MG PO TABS
100.0000 mg | ORAL_TABLET | Freq: Every day | ORAL | Status: DC
  Administered 2018-09-10 – 2018-11-13 (×65): 100 mg
  Filled 2018-09-09 (×65): qty 1

## 2018-09-09 MED ORDER — MEGESTROL ACETATE 40 MG PO TABS
80.0000 mg | ORAL_TABLET | Freq: Two times a day (BID) | ORAL | Status: DC
  Administered 2018-09-09 – 2018-10-14 (×70): 80 mg via ORAL
  Filled 2018-09-09 (×71): qty 2

## 2018-09-09 MED ORDER — INSULIN GLARGINE 100 UNIT/ML ~~LOC~~ SOLN
20.0000 [IU] | Freq: Two times a day (BID) | SUBCUTANEOUS | Status: DC
  Administered 2018-09-09 – 2018-09-12 (×6): 20 [IU] via SUBCUTANEOUS
  Filled 2018-09-09 (×7): qty 0.2

## 2018-09-09 MED ORDER — INSULIN ASPART 100 UNIT/ML ~~LOC~~ SOLN
6.0000 [IU] | SUBCUTANEOUS | Status: DC
  Administered 2018-09-09 – 2018-09-12 (×13): 6 [IU] via SUBCUTANEOUS

## 2018-09-09 MED ORDER — SODIUM CHLORIDE 0.45 % IV SOLN
INTRAVENOUS | Status: DC
  Administered 2018-09-09 – 2018-09-10 (×2): via INTRAVENOUS

## 2018-09-09 NOTE — Consult Note (Signed)
[facility] Nurse wound consult note Patient evaluated in [place] with the assistance of her primary care RN. The PIs to the labia are healed.  Desitin is in use.  Please continue to use Desitin. Andrea Riles, RN, MSN, CWOCN, CNS-BC, pager [phone]

## 2018-09-09 NOTE — Progress Notes (Signed)
Nutrition Follow-up  DOCUMENTATION CODES:   Morbid obesity  INTERVENTION:   Continue:  Glucerna 1.2 @ 45 ml/hr (1080 ml/ay) via g-tube 60 ml Prostat BID  Provides: 1696 kcal, 124 grams protein, and 875 ml free water.  Total free water: 1475 ml  Total carbohydrate: 163 grams  NUTRITION DIAGNOSIS:   Inadequate oral intake related to inability to eat as evidenced by NPO status. Ongoing  GOAL:   Patient will meet greater than or equal to 90% of their needs Met.   MONITOR:   Vent status, TF tolerance, I & O's, Labs  ASSESSMENT:   Andrea [name] is a 57 yo female with PMH of type 2 diabetes, HTN, CKD III, obesity, and anemia admitted for chest pain. Code Stroke called 10/10 in AM. 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 Intubated 10/10 in ICU.   Pt discussed during ICU rounds and with RN.  Plan for ATC trial today. Per MD may be candidate for eye movement communication device in future.  Consider transition to bolus TF in the future  10/21 trach and PEG 10/28 ex lap for PEG dislodgement, PEG replaced 11/1 ex lap, I&D  Medications reviewed and include: colace, ferrous sulfate TID, SSI, 8 units novolog every 4 hours, lantus 22 units BID, MVI, thiamine  300 ml free water every 4 hours = 1800 ml  Labs reviewed: Na 150 (H) CBG (last 3)  Recent Labs    09/09/18 0836 09/09/18 1121 09/09/18 1536  GLUCAP 102* 132* 112*     Diet Order:   Diet Order            Diet NPO time specified  Diet effective ____              EDUCATION NEEDS:   Not appropriate for education at this time  Skin:  Skin Assessment: Skin Integrity Issues: Skin Integrity Issues:: Incisions Stage II: healed Incisions: abd  Last BM:  11/4 - medium  Height:   Ht Readings from Last 1 Encounters:  09/03/18 _0  (1.626 m)    Weight:   Wt Readings from Last 1 Encounters:  09/09/18 129 kg    Ideal Body Weight:  54.5 kg  BMI:  Body mass index is 48.82 kg/m.  Estimated Nutritional  Needs:   Kcal:  9672-8979  Protein:  120-130 grams  Fluid:  >1.5 L/day  Maylon Peppers RD, LDN, CNSC [phone] Pager [phone] After Hours Pager\

## 2018-09-09 NOTE — Progress Notes (Addendum)
  Speech Language Pathology Treatment: [name] Speaking valve  Patient Details Name: Andrea [name] MRN: [id number] DOB: 1961/05/23 Today's Date: 09/09/2018 Time: 5449-2010 SLP Time Calculation (min) (ACUTE ONLY): 21 min  Assessment / Plan / Recommendation Clinical Impression  Treatment concentrated on verbal communication, respiratory/phonatory synchronization using PMV. RT placed pt on trach collar (from [facility]) who exhibited visible mild anxiety and increased work of breathing on arrival. She tolerated deep suctioning with RT and cuff deflation demonstrating adequate upper airway patency. Speech intelligibility decreased due to dysarthria marked by her low intensity, increased rate, length of utterances and distorted articulation/resonance with maximum modeling of speech strategies/coordination of breath prior to phonation provided. RR lead not connected however no significant dyspnea observed using valve, HR and SpO2 within adequate limits. Noted to have difficulty clearing oral secretions and requiring suctioning, Valve donned approximately 15-18 minutes with one questionable, mild retention of CO2 suspected. ST will continue to treat for dysphagia and cognition.    HPI HPI: 57 yo presented to ED with chest pain noted Rt hemiparesis in ED with acute Left paramedian brainstem infarct. Intubated 10/10 for angioplasty, extubated post procedure and reintubated. Repeat MRI with brainstem infarct extension and bil cerebellar infarcts. Peg/trach 08/12/2018.PMHx: HTN, DM, CKD      SLP Plan  Continue with current plan of care       Recommendations         Patient may use Passy-Muir Speech Valve: with SLP only PMSV Supervision: Full         Oral Care Recommendations: Oral care QID Follow up Recommendations: LTACH SLP Visit Diagnosis: Aphonia (R49.1);Cognitive communication deficit (O71.219) Plan: Continue with current plan of care                       Houston Siren 09/09/2018, 12:24 PM   Orbie Pyo Colvin Caroli.Ed Risk analyst [phone] Office [phone]

## 2018-09-09 NOTE — Progress Notes (Signed)
Patient ID: Andrea [name], female   DOB: November 29, 1960, 57 y.o.   MRN: [id number] Midline wound cleaner G tube functional, less drainage around it I&D sit LLQ cleaner as well  S/p PEG placement 10/21 S/p exploratory laparotomy with open gastrostomy tube and I&D 10/28 S/p takeback for repeat I&D abdominal wall + exlap 11/1  Dressing changes Merrem for klebsiella and proteus  Georganna Skeans, MD, MPH, FACS Trauma: [phone] General Surgery: [phone]

## 2018-09-09 NOTE — Progress Notes (Signed)
STROKE TEAM PROGRESS NOTE   SUBJECTIVE (INTERVAL HISTORY) Pt RN and one family member are at bedside. Dr. Grandville Silos also in home for checking abdominal wounds which looks much improved. Pt still on vent, no acute neuro change. Pt still upset about her condition. Still has vaginal bleeding as per RN.   OBJECTIVE Vitals:   09/09/18 0530 09/09/18 0600 09/09/18 0830 09/09/18 0845  BP: (!) 147/86 (!) 145/82  (!) 160/84  Pulse:  77  76  Resp:  (!) 26  (!) 35  Temp:   98.8 F (37.1 C)   TempSrc:   Oral   SpO2:  96%  94%  Weight:      Height:        CBC:  Recent Labs  Lab 09/07/18 0459 09/07/18 1834 09/09/18 0457  WBC 12.1*  --  9.9  NEUTROABS 8.9*  --   --   HGB 6.8* 8.2* 8.4*  HCT 24.0* 28.9* 30.4*  MCV 86.0  --  88.9  PLT 479*  --  492*    Basic Metabolic Panel:  Recent Labs  Lab 09/09/2018 0613 09/06/18 0456 09/07/18 0459 09/09/18 0457  NA 149* 149* 148* 150*  K 3.8 4.1 3.7 3.9  CL 119* 121* 117* 120*  CO2 23 25 26 25   GLUCOSE 110* 155* 117* 104*  BUN 52* 55* 51* 41*  CREATININE 1.28* 1.31* 1.09* 0.86  CALCIUM 11.1* 11.0* 10.9* 10.4*  MG 2.5*  --   --   --   PHOS 2.5 3.3  --   --     Lipid Panel:     Component Value Date/Time   CHOL 256 (H) 08/15/2018 0512   TRIG 248 (H) 08/15/2018 0512   HDL 35 (L) 08/15/2018 0512   CHOLHDL 7.3 08/15/2018 0512   VLDL 50 (H) 08/15/2018 0512   LDLCALC 171 (H) 08/15/2018 0512   HgbA1c:  Lab Results  Component Value Date   HGBA1C 11.4 (H) 08/15/2018   Urine Drug Screen:     Component Value Date/Time   LABOPIA NONE DETECTED 08/18/2018 1836   COCAINSCRNUR NONE DETECTED 08/20/2018 1836   LABBENZ NONE DETECTED 08/10/2018 1836   AMPHETMU NONE DETECTED 08/19/2018 1836   THCU NONE DETECTED 09/03/2018 1836   LABBARB NONE DETECTED 08/23/2018 1836    Alcohol Level No results found for: ETH  IMAGING  Ct Angio Head W Or Wo Contrast Ct Angio Neck W Or Wo Contrast 08/15/2018 IMPRESSION:  1. Interval basilar to left  proximal PCA stenting. The density of the stent walls precludes detection of in stent stenosis; there is a degree of wasting at the mid basilar segment. There is flow in the left PCA beyond the stent such that there is presumed stent patency.  2. Known lower pontine infarct. There are new small cerebellar infarcts since brain MRI yesterday.  3. Severe left P2 segment stenosis, also seen previously.  4. Moderate atheromatous narrowing in the proximal left MCA.    Ct Angio Head W Or Wo Contrast Ct Angio Neck W Or Wo Contrast 08/06/2018 IMPRESSION:  1. 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 No definite acute thrombus identified.  2. Severe stenosis left posterior cerebral artery and moderate stenosis right posterior cerebral artery.  3. Mild stenosis left MCA and moderate stenosis left MCA bifurcation.  4. No significant carotid or vertebral artery stenosis in the neck.    Ct Head Wo Contrast 08/17/2018 IMPRESSION:  No acute intracranial abnormalities. Mild  white matter changes likely due to small vessel ischemia.    Mr Brain Wo Contrast 08/26/2018 IMPRESSION:  1. Acute nonhemorrhagic infarct involving the left paramedian brainstem. The infarct crosses midline.  2. Other periventricular and subcortical white matter disease is moderately advanced for age. This likely reflects the sequela of chronic microvascular ischemia.  3. Tapering of the dens with prominent soft tissue pannus. 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    Ct Head Code Stroke Wo Contrast 08/26/2018  IMPRESSION:  1. No acute abnormality and no change from earlier today  2. ASPECTS is 10 3.    Cerebral Angiogram 08/22/2018 S/P 4 vessel cerebral arteriogram RT CFA approach. 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 S/P stent assisted  angioplasty of distal basilar artery with patency of 80 to 90 %. 2.Approx 70 % stenosis of LT ICA supraclinoid seg   MRI 08/15/18 1. Progressive acute pontine infarct. New patchy bilateral cerebellar infarction. New tiny left thalamic infarcts. 2. Preserved flow void in the stented basilar. 3. left facial/submandibular edema, please correlate with neck exam. Edited result: IMPRESSION: 1. Progressive acute pontine infarct. New patchy bilateral cerebellar infarction. New tiny left thalamic infarcts. 2. Preserved flow void in the stented basilar.  TTE - Left ventricle: The cavity size was normal. There was severe   concentric hypertrophy. Systolic function was normal. The   estimated ejection fraction was in the range of 60% to 65%. Wall   motion was normal; there were no regional wall motion   abnormalities. Doppler parameters are consistent with abnormal   left ventricular relaxation (grade 1 diastolic dysfunction). The   E/e&' ratio is <8, suggesting normal LV filling pressure. - Aortic valve: Trileaflet; mildly calcified leaflets.   Transvalvular velocity was minimally increased. There was no   regurgitation. Mean gradient (S): 12 mm Hg. - Mitral valve: Mildly thickened leaflets . There was trivial   regurgitation. - Left atrium: The atrium was normal in size. - Inferior vena cava: The vessel was dilated. The respirophasic   diameter changes were blunted (< 50%), consistent with elevated   central venous pressure. Impressions: - LVEF 60-65%, severe LVH, normal wall motion, grade 1 DD, normal   LV filling pressure, minimally increased aortic velocity without   signficant stenosis, trivial MR, normal LA size, dilated IVC.  CT abdomen and pelvis 09/03/18 Mild bilateral posterior basilar subsegmental atelectasis. Interval placement of new gastrostomy tube with tip and balloon within gastric lumen. Surgical drain is seen in the soft tissues in previous gastrostomy site. Small  amount of gas and stranding is seen in the peritoneal fat anterior and inferior to the stomach which most likely is related to gastrostomy placement. Probable surgical wound seen in right upper quadrant of the anterior abdominal wall. 8.2 x 2.3 cm gas and fluid collection is seen in the anterior abdominal wall which is unchanged compared to prior exam. Possible abscess cannot be excluded. Enlarged fibroid uterus.  PHYSICAL EXAM  Temp:  [98.2 F (36.8 C)-99.1 F (37.3 C)] 98.8 F (37.1 C) (11/05 0830) Pulse Rate:  [70-92] 76 (11/05 0845) Resp:  [23-35] 35 (11/05 0845) BP: (132-160)/(75-90) 160/84 (11/05 0845) SpO2:  [94 %-100 %] 94 % (11/05 0845) FiO2 (%):  [40 %] 40 % (11/05 0845) Weight:  [676 kg] 129 kg (11/05 0500)  General -  Obese middle-aged African-American lady, not in distress. She has abdominal surgical wound and drainage  from the abdominal wall   Ophthalmologic - fundi not visualized due to noncooperation.  Cardiovascular - not tachycardic currently in the 80s RRR  Neuro - post trach, on vent, off sedation. Eyes open, awake, alert. No ptosis or EOMI, denies diplopia.  PERRL. blinking to visual threat bilaterally. Right gaze nystagmus, can follow and track examiner across the room. Bilateral facial weakness, R>L, corneal, gag and cough present. Able to open mouth and protrude tongue slightly. Quadriplegia except left toe wiggling and left hand pinky slight movement. Grimaces to pain to the BUE. DTR diminished throughout. Muscle tone decreased. Coordination and gait not tested.    ASSESSMENT/PLAN Ms. TRISA CRANOR is a 57 y.o. female with history of difficult to control hypertension, insulin-dependent diabetes, obesity, hypothyroidism status post ablation presenting with chest discomfort, right-sided weakness, slurred speech and right facial droop. She did not receive IV t-PA due to late presentation. S/P stent assisted angioplasty of distal basilar artery.  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 Worsening symptoms with extension of pontine/medullary infarcts with new small bilateral cerebellar infarcts without evidence of stent re-stenosis or occlusion  Resultant quadriplegia, on trach and peg  CT head - No acute intracranial abnormalities.  CTA H&N 08/20/2018 - Extensive atherosclerotic disease in the basilar with focal critical stenosis in the mid to distal basilar. No definite acute thrombus identified. Severe stenosis left posterior cerebral artery  MRI head - Acute nonhemorrhagic infarct involving the left paramedian brainstem.   CTA H&N 08/15/2018 - stent too dense to see BA lumen, but distal flow preserved. New small cerebellar infarcts since brain MRI yesterday.  Severe left P2 segment stenosis, also seen previously.   Repeat MRI - extension of b/l pontine and upper medullary infarcts with new b/l cerebellar infarcts.  2D Echo - EF 60-65%  LDL - 174  HgbA1c - 11.1  VTE prophylaxis - heparin subq  aspirin 81 mg daily prior to admission, now resumed on Brilinta 90 mg bid and ASA 81 mg daily.   Patient counseled to be compliant with her antithrombotic medications  Ongoing aggressive stroke risk factor management  Therapy recommendations:  LTACH  Disposition:  Pending   Intracranial stenosis  BA mid to distal severe stenosis s/p BA stenting  Left PCA severe stenosis, R PCA moderate stenosis  Left MCA moderate stenosis  Uncontrolled stroke risk factors with HLD, HTN, DM  Non compliance with meds at home  Respiratory failure  S/p trach 08/11/2018  On trach collar then put back on vent for surgeries  Still on vent due to increased WOB  Wean off vent if able   CCM on board  Hypertension  Stableside  BP goal 130-150   On po norvasc, Coreg 25 bid, and hydralazine 25mg  tid  Hyperlipidemia  Lipid lowering medication PTA:  Lipitor 20 mg daily  LDL 174, goal <  70  Increased to 80 mg daily  Continue statin at discharge  Diabetes  HgbA1c 11.4, goal < 7.0  Uncontrolled at home  Glucose on the lower end of normal for DM pt  Decrease Lantus to 20U bid   Decrease basal NovoLog to 6U Q4h  SSI  CBG monitoring  Low grade fever with leukocytosis  Tmax 101.6 -> afebrile  Leukocytosis - 14.0->12.1->9.9  UA WBC 21-50  CXR unremarkable, improving from prior  On meropenem now  Blood cultures negative 10/26 ;  Respiratory cultures - mixed flora   Abdominal abscess culture showed proteus mirabilis  CCM and trauma on board  Hypernatremia and elevated Cre/BUN  Na 149->148->150  Cre 1.31->1.09->0.86  Change NS to 1/2NS @ 50  On TF @ 45  On free water 300mg  Q4h  Continue BMP monitoring  Dysphagia s/p PEG complicated by abdominal wall abscess  Continue tube feeding @ 45 cc/h  PEG done 08/14/2018  Removed and replaced dislodged PEG 08/06/2018  CT showed Abdominal wall abscess - s/p open surgical drainage 08/26/2018 persistent foul odor discharge. Reexploration 09/17/2018  Culture showed peroteus mirabilis  On meropenum now   Anemia   Hb 6.8->PRBC->7.1->6.8->8.2->8.4  Could be due to iron deficiency and acute uterine bleeding and recent surgeries  Iron panel showed iron deficiency  Put on iron pills  PRBC transfusion 1 U 09/09/2018 and 2U 09/07/18  CBC monitoring  Abnormal uterine bleeding, improved  Has been following with GYN  Was on megace in the past  CT abd/pelvis 09/03/18 - enlarged fibroid uterus  resume megace 80mg  bid  Continue ASA and brilinta for now  Close CBC monitoring  Other Stroke Risk Factors  Obesity, Body mass index is 48.82 kg/m., recommend weight loss, diet and exercise as appropriate   Other Active Problems  Hypokalemia - on supplement  Thrombocytosis - Plts - 489 -> 553-> 535->479->492  Hospital day # 26  This patient is critically ill due to worsening brainstem infarct, BA  stenosis s/p stenting, hyperglycemia, hypertensive, fever, anemia, abdominal wall abscess s/p drainage, dislodged PEG and hypernatremiaand at significant risk of neurological worsening, death form recurrent stroke, hemorrhagic conversion, CHF, seizure, aspiration, hemorrhagic shock. This patient's care requires constant monitoring of vital signs, hemodynamics, respiratory and cardiac monitoring, review of multiple databases, neurological assessment, discussion with family, other specialists and medical decision making of high complexity. I spent 40 minutes of neurocritical care time in the care of this patient. I discussed with Dr. Grandville Silos at bedside.  Rosalin Hawking, MD PhD Stroke Neurology 09/09/2018 11:07 AM   To contact Stroke Continuity provider, please refer to [email or web address] After hours, contact General Neurology

## 2018-09-10 LAB — BASIC METABOLIC PANEL
ANION GAP: 5 (ref 5–15)
BUN: 38 mg/dL — AB (ref 6–20)
CHLORIDE: 116 mmol/L — AB (ref 98–111)
CO2: 26 mmol/L (ref 22–32)
Calcium: 10.4 mg/dL — ABNORMAL HIGH (ref 8.9–10.3)
Creatinine, Ser: 0.8 mg/dL (ref 0.44–1.00)
GFR calc Af Amer: >60 mL/min (ref >60)
GLUCOSE: 121 mg/dL — AB (ref 70–99)
POTASSIUM: 4.1 mmol/L (ref 3.5–5.1)
Sodium: 147 mmol/L — ABNORMAL HIGH (ref 135–145)

## 2018-09-10 LAB — GLUCOSE, CAPILLARY
GLUCOSE-CAPILLARY: 108 mg/dL — AB (ref 70–99)
GLUCOSE-CAPILLARY: 139 mg/dL — AB (ref 70–99)
GLUCOSE-CAPILLARY: 112 mg/dL — AB (ref 70–99)
Glucose-Capillary: 115 mg/dL — ABNORMAL HIGH (ref 70–99)
Glucose-Capillary: 136 mg/dL — ABNORMAL HIGH (ref 70–99)
Glucose-Capillary: 68 mg/dL — ABNORMAL LOW (ref 70–99)
Glucose-Capillary: 71 mg/dL (ref 70–99)

## 2018-09-10 LAB — CBC
HCT: 31.3 % — ABNORMAL LOW (ref 36.0–46.0)
HEMOGLOBIN: 8.9 g/dL — AB (ref 12.0–15.0)
MCH: 25.3 pg — AB (ref 26.0–34.0)
MCHC: 28.4 g/dL — ABNORMAL LOW (ref 30.0–36.0)
MCV: 88.9 fL (ref 80.0–100.0)
NRBC: 0.9 % — AB (ref 0.0–0.2)
Platelets: 497 10*3/uL — ABNORMAL HIGH (ref 150–400)
RBC: 3.52 MIL/uL — AB (ref 3.87–5.11)
RDW: 17.7 % — ABNORMAL HIGH (ref 11.5–15.5)
WBC: 11.2 10*3/uL — ABNORMAL HIGH (ref 4.0–10.5)

## 2018-09-10 NOTE — Progress Notes (Addendum)
NAME:  Andrea [name], MRN:  [id number], DOB:  [birth year], LOS: 37 ADMISSION DATE:  08/26/2018, CONSULTATION DATE:  08/12/2018 REFERRING MD:  Dr. Rory Percy, CHIEF COMPLAINT:  CP/ Acute CVA  Brief History   25 yoF w/poorly controlled HTN and IDDM presenting with CP and left arm pain w/BP 220/117. Initial CTH neg.  Cardiac workup negative for acute event thus far, cardiology following.  Code stroke initiated for R hemiplegia, dysarthria and right facial droop.  Out of window for TPA.  Requiring cleviprex for BP control.  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  Evolving pontine stroke on MRI. Intubated 10/10 for neuro IR s/p stenting and angioplasty with 80-90% patency.  Extubated post procedure but due to insufficency placed on BiPAP and brought to ICU.  Required reintubation and ultimately trach (10/21 JY).  Peg tube placed 10/21 by CCS.  Taken to OR 10/28 for open gastrostomy and I&D of abdominal wall abscess. 11/1 taken back to OR for repeat ex-lap.     Past Medical History  HTN, IDDM, hypothyroidism s/p ablation  Significant Hospital Events   10/9 present to [facility] ED -transferred to [facility] 10/9 basilar artery stenting 10/9 intubated urgently 6h post procedure for inability to protect airway. 10/10 CTA for declining exam - evolving pontine infarct, stent deemed to be patent 10/14 Brillinta stopped and ASA/ heparin started to in anticipation of tracheostomy on Monday. 10/14 through 10/19: Neuro exam has been stable sodium increasing in spite of free water via tube, providing supportive care awaiting trach 10/20: Sodium down to 151 from 156 clinically looks the same 10/21: Sodium down to 148, sputum culture sent for purulent tracheal secretions, awaiting trach and PEG.  Purulent bronchial secretions from right upper lobe sent for BAL 10/22: Tracheostomy and PEG completed on the 21st tolerated well.  Spiking low-grade fever white cells continued to climb starting empiric  vancomycin and ceftaz.  Sodium improved down to 146.  Blood pressure improving but still not at goal added hydrochlorothiazide.  Resumed Brilinta.  10/23: Sodium normal.  Backing down on free water.  Glycemic control challenging, increased basal NovoLog in addition to Lantus and sliding scale.  Still spiking fever, white blood cell count climbing, sending blood cultures.  Foley catheter being placed for pressure ulcer caused by pure wick device 10/27 Foul smelling dark trach secretions, PEG secretions  10/28 to OR for ex lap due to PEG dislodgement.  PEG replaced 10/29 remains on MV 2/2 tachypnea; family meeting 10/30 no weaning 2/2 to ongoing tachypnea; foul smelling drainage from abd site 10/31 no weaning, abx changed, family meeting  Consults: date of consult/date signed off & final recs:  Cards 10/10 Neurology 10/10 PCCM 10/10 CCS 10/17  Procedures (surgical and bedside):  10/10 Neuro IR >> s/p stent and angioplasty of distal basilar artery  10/10 ETT for procedure 10/10 ETT (reintubated) >> removed 10/21: Size 6 tracheostomy placed by Dr. Nelda Marseille 10/26 changed to #4 trach cuffless  10/28 Ex lap with replacement of gastrostomy tube due to dislodgement 11/1 repeat Ex-lap with I&D of abdominal wall abscess   Significant Diagnostic Tests:  10/10  CTH >>  No acute intracranial abnormalities. 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 No definite acute thrombus identified. Severe stenosis left posterior cerebral artery  and moderate stenosis right posterior cerebral artery. Mild stenosis left MCA and moderate stenosis left MCA bifurcation. No significant carotid or vertebral artery stenosis in the neck. 10/10 MRI >> evolving pontine infarct. 10/11 ECHO >> normal LVSF with  severe LVH. 10/28 CT A / P > dislodged PEG tube outside of the peritoneum and not within the stomach.  Cholelithiasis. No bowel obstruction.  No hydronephrosis.  10/30 CT abd/pelvis >> Mild bilateral atelectasis.  Interval placement of new gastrostomy tube with tip and balloon within gastric lumen. Surgical drain is seen in the soft tissues in previous gastrostomy site. Small amount of gas and stranding is seen in the peritoneal fat anterior and inferior to the stomach which most likely is related to gastrostomy placement. Probable surgical wound seen in right upper quadrant of the anterior abdominal wall.  8.2 x 2.3 cm gas and fluid collection is seen in the anterior abdominal wall which is unchanged compared to prior exam. Possible abscess cannot be excluded.  Enlarged fibroid uterus.  Micro Data:  10/10 MRSA PCR >> negative 10/21 Sputum culture >> nl resp flora  10/23 Blood cultures x 2 >> negative 10/26 Blood cultures x 2 >> neg 10/28 MRSA PCR >> neg 10/27 Sputum culture >> normal flora 11/1 Abdominal abscess> kelbsiella, proteus, susceptible to imipenem, amp-sulbactam, ESBL  Antimicrobials:  10/10 cefazolin preop 10/22 Vancomycin >> 10/30 10/22 Ceftaz >> 10/31 10/31 ceftriaxone >> 11/4 10/31 flagyl >> 11/4 11/4 meropenem >>   Subjective:   No acute events   Objective   Blood pressure (!) 156/85, pulse 77, temperature 98.8 F (37.1 C), temperature source Oral, resp. rate 15, height _0  (1.626 m), weight 129.3 kg, last menstrual period 07/26/2018, SpO2 100 %.    Vent Mode: PRVC FiO2 (%):  [40 %] 40 % Set Rate:  [15 bmp] 15 bmp Vt Set:  [420 mL] 420 mL PEEP:  [5 cmH20] 5 cmH20 Plateau Pressure:  [12 cmH20-16 cmH20] 12 cmH20   Intake/Output Summary (Last 24 hours) at 09/10/2018 0757 Last data filed at 09/10/2018 0600 Gross per 24 hour  Intake 6365.42 ml  Output 1500 ml  Net 4865.42 ml   Filed Weights   09/08/18 0353 09/09/18 0500 09/10/18 0500  Weight: 128 kg 129  kg 129.3 kg   Examination:   General:  In bed on vent HENT: NCAT trach in place PULM: CTA B, vent supported breathing CV: RRR, no mgr GI: minimal bowel sounds, abdominal dressing in place MSK: normal bulk and tone Neuro: awake on vent, nods head appropriately     Assessment & Plan:   Ongoing severe acute Respiratory failure/ hypoxemia due to inability to protect airway from cva -Expected airway difficulties with location of stroke s/p tracheostomy 10/21 -11/1 CXR personally reviewed: ? 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/p stent placement with extension of infarcts into the pontine and medullary spaces and small bilateral cerebellar infarcts   -Resulting in inadequate airway protection and quadriplegia P: Anti-platelets per Neurology PT  Abd wall abscess with polymicrobial drainage - s/p I&D 10/29 by CCS P: Continue meropenem, would plan 5 days then re-assess  Hypertension P: Continue carvedilol, hydralazine, norvasc, HCTZ PRN anti-hypertensive for SBP > 180  Hypernatremia  P: Continue free water Monitor BMET and UOP Replace electrolytes as needed  Mild hypercalcemia > monitor   Disposition /  Summary of Today's Plan 09/10/18   Read above  Best Practice  Diet: TFs Pain/Anxiety/Delirium protocol (if indicated): RASS goal 0  VAP protocol (if indicated): Ordered DVT prophylaxis: SCDs  GI prophylaxis: Famotidine Hyperglycemia protocol: SSI, lantus Mobility: Bedrest Code Status: Full  Family: ongoing with PMT  Roselie Awkward, MD [place] [facility] Pager: [phone] Cell: [phone] After 3pm or if no response, call [phone]

## 2018-09-10 NOTE — Progress Notes (Signed)
Occupational Therapy Treatment Patient Details Name: Andrea [name] MRN: [id number] DOB: 1961-09-17 Today's Date: 09/10/2018    History of present illness 57 yo presented to ED with chest pain noted Rt hemiparesis in ED with acute Left paramedian brainstem infarct. Intubated 10/10 for angioplasty, extubated post procedure and reintubated. Repeat MRI with brainstem infarct extension and bil cerebellar infarcts. Peg/trach with return to vent 08/20/2018. 10/22-29 on trach collar, return to vent 10/29. 10/28 I & D of abdominal wall abscess with placement of penrose drain and G-tube, 11/1 repeat ex lap.  PMHx: HTN, DM, CKD   OT comments  Excellent session. Entire session completed on TC with VSS. Pt sat EOB for 10  Min with max A. Able to hold head in midline independently. Pt with increased active movement LUE, completing grip and exercises and counting out loud. Pt difficult to understand at times but overall appropriate, however easily distracted. Pt trying to sing "shake your booty" song during session. Refers to self as Facilities manager". Given progress today will plan to order Vital Go Lift bed. Will continue to follow acutely.    Follow Up Recommendations  SNF;Supervision/Assistance - 24 hour    Equipment Recommendations  Other (comment)(TBA)    Recommendations for Other Services      Precautions / Restrictions Precautions Precautions: Fall Precaution Comments: Peg/trach Required Braces or Orthoses: (used abdominal binder for mobility)       Mobility Bed Mobility Overal bed mobility: Needs Assistance       Supine to sit: Total assist;+2 for physical assistance Sit to supine: Total assist;+2 for physical assistance      Transfers                 General transfer comment: did not attempt this session    Balance     Sitting balance-Leahy Scale: Zero Sitting balance - Comments: Pt able to extend head in sitting and activate trunk to shift weight anteriorly and posteriorly  minimally                                   ADL either performed or assessed with clinical judgement   ADL     Eating/Feeding Details (indicate cue type and reason): Pt stating she wants peanut butter ice cream   Grooming Details (indicate cue type and reason): hand over hand to wipe chin                               General ADL Comments: total A at this time     Vision   Vision Assessment?: Vision impaired- to be further tested in functional context   Perception     Praxis      Cognition Arousal/Alertness: Awake/alert Behavior During Therapy: WFL for tasks assessed/performed Overall Cognitive Status: Difficult to assess Area of Impairment: Attention                   Current Attention Level: Sustained   Following Commands: Follows one step commands consistently                Exercises General Exercises - Upper Extremity Shoulder Flexion: PROM;Both;10 reps;Supine Shoulder ABduction: PROM;Both;10 reps;Supine Shoulder ADduction: PROM;Both;10 reps;Supine;AAROM;Left Elbow Flexion: PROM;Both;10 reps;AAROM Elbow Extension: PROM;Both;10 reps;Supine;AAROM;Left Wrist Flexion: PROM;Both;10 reps;Supine;AAROM;Left Wrist Extension: PROM;Both;10 reps;Supine;AAROM Digit Composite Flexion: PROM;Both;5 reps;Supine;AAROM;Left Composite Extension: PROM;Both;5 reps;Supine;AAROM;Left Other Exercises Other Exercises: facilitation of upper  trunk with cervical ratation with hand behind scapula - B sides Other Exercises: general neck ROM   Shoulder Instructions       General Comments Pt singing at times during session "shake your booty" and a "chicken butt" song    Pertinent Vitals/ Pain       Pain Assessment: Faces Faces Pain Scale: Hurts little more Pain Location: grimace with head and neck movement, grimace with noxious stimuli all extremities Pain Intervention(s): Limited activity within patient's tolerance  Home Living                                           Prior Functioning/Environment              Frequency  Min 2X/week        Progress Toward Goals  OT Goals(current goals can now be found in the care plan section)  Progress towards OT goals: Progressing toward goals  Acute Rehab OT Goals Patient Stated Goal: per family for pt to get stronger OT Goal Formulation: With patient/family Time For Goal Achievement: 09/11/18 Potential to Achieve Goals: Fair ADL Goals Pt Will Perform Grooming: with max assist;bed level Pt Will Perform Upper Body Bathing: with max assist;bed level Pt/caregiver will Perform Home Exercise Program: Increased ROM;With written HEP provided;Both right and left upper extremity Additional ADL Goal #1: Pt will sit EOB with +2 Max A x 10 min in preparation for functional activities.  Plan Discharge plan remains appropriate    Co-evaluation                 AM-PAC PT "6 Clicks" Daily Activity     Outcome Measure   Help from another person eating meals?: Total Help from another person taking care of personal grooming?: Total Help from another person toileting, which includes using toliet, bedpan, or urinal?: Total Help from another person bathing (including washing, rinsing, drying)?: Total Help from another person to put on and taking off regular upper body clothing?: Total Help from another person to put on and taking off regular lower body clothing?: Total 6 Click Score: 6    End of Session Equipment Utilized During Treatment: Oxygen(TC)  OT Visit Diagnosis: Other abnormalities of gait and mobility (R26.89);Muscle weakness (generalized) (M62.81);Low vision, both eyes (H54.2);Feeding difficulties (R63.3);Other symptoms and signs involving cognitive function;Hemiplegia and hemiparesis;Pain Hemiplegia - Right/Left: Right Hemiplegia - dominant/non-dominant: Non-Dominant Hemiplegia - caused by: Nontraumatic intracerebral hemorrhage;Cerebral infarction Pain  - part of body: (neck)   Activity Tolerance Patient tolerated treatment well   Patient Left in bed;with call bell/phone within reach;with nursing/sitter in room(chair position)   Nurse Communication Mobility status        Time: 3354-5625 OT Time Calculation (min): 43 min  Charges: OT General Charges $OT Visit: 1 Visit OT Treatments $Therapeutic Activity: 8-22 mins $Neuromuscular Re-education: 23-37 mins  Maurie Boettcher, OT/L   Acute OT Clinical Specialist [place] Pager [phone] Office [phone]    [facility] 09/10/2018, 4:25 PM

## 2018-09-10 NOTE — Progress Notes (Signed)
STROKE TEAM PROGRESS NOTE   SUBJECTIVE (INTERVAL HISTORY) Pt RN and speech therapist are at bedside. Pt was put on speaking valve and she is speaking but with hypophonia and severe dysarthria and hard to understand. No neuro changes. Na 147 now. And on trach collar today.   OBJECTIVE Vitals:   09/10/18 0630 09/10/18 0638 09/10/18 0700 09/10/18 0750  BP: (!) 160/86 (!) 160/86 (!) 156/85 (!) 154/79  Pulse: 80  77 88  Resp: (!) 27  15 (!) 24  Temp:    99 F (37.2 C)  TempSrc:    Axillary  SpO2: 100%  100% 100%  Weight:      Height:        CBC:  Recent Labs  Lab 09/07/18 0459  09/09/18 0457 09/10/18 0444  WBC 12.1*  --  9.9 11.2*  NEUTROABS 8.9*  --   --   --   HGB 6.8*   < > 8.4* 8.9*  HCT 24.0*   < > 30.4* 31.3*  MCV 86.0  --  88.9 88.9  PLT 479*  --  492* 497*   < > = values in this interval not displayed.    Basic Metabolic Panel:  Recent Labs  Lab 09/16/2018 0613 09/06/18 0456  09/09/18 0457 09/10/18 0444  NA 149* 149*   < > 150* 147*  K 3.8 4.1   < > 3.9 4.1  CL 119* 121*   < > 120* 116*  CO2 23 25   < > 25 26  GLUCOSE 110* 155*   < > 104* 121*  BUN 52* 55*   < > 41* 38*  CREATININE 1.28* 1.31*   < > 0.86 0.80  CALCIUM 11.1* 11.0*   < > 10.4* 10.4*  MG 2.5*  --   --   --   --   PHOS 2.5 3.3  --   --   --    < > = values in this interval not displayed.    Lipid Panel:     Component Value Date/Time   CHOL 256 (H) 08/15/2018 0512   TRIG 248 (H) 08/15/2018 0512   HDL 35 (L) 08/15/2018 0512   CHOLHDL 7.3 08/15/2018 0512   VLDL 50 (H) 08/15/2018 0512   LDLCALC 171 (H) 08/15/2018 0512   HgbA1c:  Lab Results  Component Value Date   HGBA1C 11.4 (H) 08/15/2018   Urine Drug Screen:     Component Value Date/Time   LABOPIA NONE DETECTED 08/25/2018 1836   COCAINSCRNUR NONE DETECTED 08/13/2018 1836   LABBENZ NONE DETECTED 09/03/2018 1836   AMPHETMU NONE DETECTED 08/17/2018 1836   THCU NONE DETECTED 08/12/2018 1836   LABBARB NONE DETECTED 08/17/2018 1836     Alcohol Level No results found for: ETH  IMAGING  Ct Angio Head W Or Wo Contrast Ct Angio Neck W Or Wo Contrast 08/15/2018 IMPRESSION:  1. Interval basilar to left proximal PCA stenting. The density of the stent walls precludes detection of in stent stenosis; there is a degree of wasting at the mid basilar segment. There is flow in the left PCA beyond the stent such that there is presumed stent patency.  2. Known lower pontine infarct. There are new small cerebellar infarcts since brain MRI yesterday.  3. Severe left P2 segment stenosis, also seen previously.  4. Moderate atheromatous narrowing in the proximal left MCA.    Ct Angio Head W Or Wo Contrast Ct Angio Neck W Or Wo Contrast 08/13/2018 IMPRESSION:  1. Extensive atherosclerotic  disease in the basilar with focal critical stenosis in the mid to distal basilar. No definite acute thrombus identified.  2. Severe stenosis left posterior cerebral artery and moderate stenosis right posterior cerebral artery.  3. Mild stenosis left MCA and moderate stenosis left MCA bifurcation.  4. No significant carotid or vertebral artery stenosis in the neck.    Ct Head Wo Contrast 08/23/2018 IMPRESSION:  No acute intracranial abnormalities. Mild white matter changes likely due to small vessel ischemia.    Mr Brain Wo Contrast 08/05/2018 IMPRESSION:  1. Acute nonhemorrhagic infarct involving the left paramedian brainstem. The infarct crosses midline.  2. Other periventricular and subcortical white matter disease is moderately advanced for age. This likely reflects the sequela of chronic microvascular ischemia.  3. Tapering of the dens with prominent soft tissue pannus. 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    Ct Head Code Stroke Wo Contrast 08/20/2018  IMPRESSION:  1. No acute abnormality and no change from earlier today  2. ASPECTS is 10 3.    Cerebral Angiogram 08/19/2018 S/P 4 vessel cerebral arteriogram RT CFA  approach. 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 S/P stent assisted angioplasty of distal basilar artery with patency of 80 to 90 %. 2.Approx 70 % stenosis of LT ICA supraclinoid seg   MRI 08/15/18 1. Progressive acute pontine infarct. New patchy bilateral cerebellar infarction. New tiny left thalamic infarcts. 2. Preserved flow void in the stented basilar. 3. left facial/submandibular edema, please correlate with neck exam. Edited result: IMPRESSION: 1. Progressive acute pontine infarct. New patchy bilateral cerebellar infarction. New tiny left thalamic infarcts. 2. Preserved flow void in the stented basilar.  TTE - Left ventricle: The cavity size was normal. There was severe   concentric hypertrophy. Systolic function was normal. The   estimated ejection fraction was in the range of 60% to 65%. Wall   motion was normal; there were no regional wall motion   abnormalities. Doppler parameters are consistent with abnormal   left ventricular relaxation (grade 1 diastolic dysfunction). The   E/e&' ratio is <8, suggesting normal LV filling pressure. - Aortic valve: Trileaflet; mildly calcified leaflets.   Transvalvular velocity was minimally increased. There was no   regurgitation. Mean gradient (S): 12 mm Hg. - Mitral valve: Mildly thickened leaflets . There was trivial   regurgitation. - Left atrium: The atrium was normal in size. - Inferior vena cava: The vessel was dilated. The respirophasic   diameter changes were blunted (< 50%), consistent with elevated   central venous pressure. Impressions: - LVEF 60-65%, severe LVH, normal wall motion, grade 1 DD, normal   LV filling pressure, minimally increased aortic velocity without   signficant stenosis, trivial MR, normal LA size, dilated IVC.  CT abdomen and pelvis 09/03/18 Mild bilateral posterior basilar subsegmental  atelectasis. Interval placement of new gastrostomy tube with tip and balloon within gastric lumen. Surgical drain is seen in the soft tissues in previous gastrostomy site. Small amount of gas and stranding is seen in the peritoneal fat anterior and inferior to the stomach which most likely is related to gastrostomy placement. Probable surgical wound seen in right upper quadrant of the anterior abdominal wall. 8.2 x 2.3 cm gas and fluid collection is seen in the anterior abdominal wall which is unchanged compared to prior exam. Possible abscess cannot be excluded. Enlarged fibroid uterus.  PHYSICAL EXAM  Temp:  [98.6 F (  37 C)-99 F (37.2 C)] 99 F (37.2 C) (11/06 0750) Pulse Rate:  [68-89] 88 (11/06 0750) Resp:  [0-31] 24 (11/06 0750) BP: (122-165)/(72-89) 154/79 (11/06 0750) SpO2:  [97 %-100 %] 100 % (11/06 0750) FiO2 (%):  [40 %] 40 % (11/06 0750) Weight:  [129.3 kg] 129.3 kg (11/06 0500)  General -  Obese middle-aged African-American lady, not in distress. She has abdominal surgical wound and drainage from the abdominal wall   Ophthalmologic - fundi not visualized due to noncooperation.  Cardiovascular - not tachycardic currently in the 80s RRR  Neuro - post trach, on trach collar, no sedation. Eyes open, awake, alert. No ptosis or EOMI, denies diplopia.  PERRL. blinking to visual threat bilaterally. Right gaze nystagmus, can follow and track examiner across the room. Bilateral facial weakness, R>L, corneal, gag and cough present. Able to open mouth and protrude tongue slightly. On speaking valve, severe dysarthria and not able to be understood. Quadriplegia, did not move toes or fingers for me today. Grimaces to pain to the BUE. DTR diminished throughout. Muscle tone decreased. Coordination and gait not tested.    ASSESSMENT/PLAN Ms. MONTSERRATH MADDING is a 57 y.o. female with history of difficult to control hypertension, insulin-dependent diabetes, obesity, hypothyroidism status  post ablation presenting with chest discomfort, right-sided weakness, slurred speech and right facial droop. She did not receive IV t-PA due to late presentation. S/P stent assisted angioplasty of distal basilar artery.  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 Worsening symptoms with extension of pontine/medullary infarcts with new small bilateral cerebellar infarcts without evidence of stent re-stenosis or occlusion  Resultant quadriplegia, on trach and peg  CT head - No acute intracranial abnormalities.  CTA H&N 08/22/2018 - Extensive atherosclerotic disease in the basilar with focal critical stenosis in the mid to distal basilar. No definite acute thrombus identified. Severe stenosis left posterior cerebral artery  MRI head - Acute nonhemorrhagic infarct involving the left paramedian brainstem.   CTA H&N 08/15/2018 - stent too dense to see BA lumen, but distal flow preserved. New small cerebellar infarcts since brain MRI yesterday.  Severe left P2 segment stenosis, also seen previously.   Repeat MRI - extension of b/l pontine and upper medullary infarcts with new b/l cerebellar infarcts.  2D Echo - EF 60-65%  LDL - 174  HgbA1c - 11.1  VTE prophylaxis - heparin subq  aspirin 81 mg daily prior to admission, now resumed on Brilinta 90 mg bid and ASA 81 mg daily.   Patient counseled to be compliant with her antithrombotic medications  Ongoing aggressive stroke risk factor management  Therapy recommendations:  LTACH  Disposition:  Pending - will wait once pt more coherent with speaking valve, need to discuss about further plan with her if she is competent with decision making.   Intracranial stenosis  BA mid to distal severe stenosis s/p BA stenting  Left PCA severe stenosis, R PCA moderate stenosis  Left MCA moderate stenosis  Uncontrolled stroke risk factors with HLD, HTN, DM  Non compliance with meds at home  Respiratory  failure  S/p trach 08/20/2018  On trach collar now  Working with speech therapist for speaking valve  CCM on board  Hypertension  Stable on the high side  BP goal 130-150   On po norvasc, Coreg 25 bid, and hydralazine 25mg  tid  Hyperlipidemia  Lipid lowering medication PTA:  Lipitor 20 mg daily  LDL 174, goal < 70  Increased to  80 mg daily  Continue statin at discharge  Diabetes  HgbA1c 11.4, goal < 7.0  Uncontrolled at home  Glucose stable  Decrease Lantus to 20U bid   Decrease basal NovoLog to 6U Q4h  SSI  CBG monitoring  Low grade fever with leukocytosis  Tmax 101.6 -> afebrile  Leukocytosis - 14.0->12.1->9.9->11.2  UA WBC 21-50  CXR unremarkable, improving from prior  On meropenem now  Blood cultures negative 10/26 ;  Respiratory cultures - mixed flora   Abdominal abscess culture showed proteus mirabilis  CCM and trauma on board  Hypernatremia and elevated Cre/BUN  Na 149->148->150->147  Cre 1.31->1.09->0.86->0.80  Continue 1/2NS @ 50  On TF @ 45  On free water 300mg  Q4h  Continue BMP monitoring  Dysphagia s/p PEG complicated by abdominal wall abscess  Continue tube feeding @ 45 cc/h  PEG done 08/30/2018  Removed and replaced dislodged PEG 08/08/2018  CT showed Abdominal wall abscess - s/p open surgical drainage 08/15/2018 persistent foul odor discharge. Reexploration 09/29/2018  Culture showed peroteus mirabilis  On meropenum now   Anemia   Hb 6.8->PRBC->7.1->6.8->8.2->8.4->8.9  Could be due to iron deficiency and acute uterine bleeding and recent surgeries  Iron panel showed iron deficiency  Put on iron pills  PRBC transfusion 1 U 09/19/2018 and 2U 09/07/18  CBC monitoring  Abnormal uterine bleeding, improved  Has been following with GYN  Was on megace in the past  CT abd/pelvis 09/03/18 - enlarged fibroid uterus  resumed megace 80mg  bid  Continue ASA and brilinta for now  Close CBC monitoring  Other  Stroke Risk Factors  Obesity, Body mass index is 48.93 kg/m., recommend weight loss, diet and exercise as appropriate   Other Active Problems  Hypokalemia - on supplement  Thrombocytosis - Plts - 489 -> 553-> 535->479->492->497  Hospital day # 27  This patient is critically ill due to worsening brainstem infarct, BA stenosis s/p stenting, hyperglycemia, hypertensive, fever, anemia, abdominal wall abscess s/p drainage, dislodged PEG and hypernatremiaand at significant risk of neurological worsening, death form recurrent stroke, hemorrhagic conversion, CHF, seizure, aspiration, hemorrhagic shock. This patient's care requires constant monitoring of vital signs, hemodynamics, respiratory and cardiac monitoring, review of multiple databases, neurological assessment, discussion with family, other specialists and medical decision making of high complexity. I spent 35 minutes of neurocritical care time in the care of this patient.   Rosalin Hawking, MD PhD Stroke Neurology 09/10/2018 11:41 AM   To contact Stroke Continuity provider, please refer to [email or web address] After hours, contact General Neurology

## 2018-09-10 NOTE — Progress Notes (Signed)
Patient ID: Andrea [name], female   DOB: 02/24/1961, 57 y.o.   MRN: [id number]    5 Days Post-Op  Subjective: Pt on trach collar.  C/o pain in her legs as best as I can read her lips.  Denies pain her abdomen currently.  Objective: Vital signs in last 24 hours: Temp:  [98.6 F (37 C)-99.2 F (37.3 C)] 99 F (37.2 C) (11/06 0750) Pulse Rate:  [68-89] 88 (11/06 0750) Resp:  [0-31] 24 (11/06 0750) BP: (122-165)/(72-89) 154/79 (11/06 0750) SpO2:  [97 %-100 %] 100 % (11/06 0750) FiO2 (%):  [40 %] 40 % (11/06 0750) Weight:  [129.3 kg] 129.3 kg (11/06 0500) Last BM Date: 09/09/18  Intake/Output from previous day: 11/05 0701 - 11/06 0700 In: 6365.4 [I.V.:998.8; OF/BP:1025; IV Piggyback:1991.6] Out: 1500 [Urine:1500] Intake/Output this shift: No intake/output data recorded.  PE: Gen: on trach collar, but seems in NAD Abd: soft, wounds are all clean and well packed, seems nontender, g-tube in place with no issues.  Lab Results:  Recent Labs    09/09/18 0457 09/10/18 0444  WBC 9.9 11.2*  HGB 8.4* 8.9*  HCT 30.4* 31.3*  PLT 492* 497*   BMET Recent Labs    09/09/18 0457 09/10/18 0444  NA 150* 147*  K 3.9 4.1  CL 120* 116*  CO2 25 26  GLUCOSE 104* 121*  BUN 41* 38*  CREATININE 0.86 0.80  CALCIUM 10.4* 10.4*   PT/INR No results for input(s): LABPROT, INR in the last 72 hours. CMP     Component Value Date/Time   NA 147 (H) 09/10/2018 0444   K 4.1 09/10/2018 0444   CL 116 (H) 09/10/2018 0444   CO2 26 09/10/2018 0444   GLUCOSE 121 (H) 09/10/2018 0444   BUN 38 (H) 09/10/2018 0444   CREATININE 0.80 09/10/2018 0444   CREATININE 0.91 04/09/2016 1213   CALCIUM 10.4 (H) 09/10/2018 0444   PROT 7.8 01/23/2018 1352   ALBUMIN 1.3 (L) 09/06/2018 0456   AST 14 (L) 01/23/2018 1352   ALT 11 (L) 01/23/2018 1352   ALKPHOS 92 01/23/2018 1352   BILITOT 0.8 01/23/2018 1352   GFRNONAA >60 09/10/2018 0444   GFRNONAA 71 04/09/2016 1213   GFRAA >60 09/10/2018 0444   GFRAA 82  04/09/2016 1213   Lipase     Component Value Date/Time   LIPASE 41 01/23/2018 1352       Studies/Results: No results found.  Anti-infectives: Anti-infectives (From admission, onward)   Start     Dose/Rate Route Frequency Ordered Stop   09/08/18 1400  meropenem (MERREM) 1 g in sodium chloride 0.9 % 100 mL IVPB     1 g 200 mL/hr over 30 Minutes Intravenous Every 8 hours 09/08/18 1311     09/04/18 1600  cefTRIAXone (ROCEPHIN) 2 g in sodium chloride 0.9 % 100 mL IVPB  Status:  Discontinued     2 g 200 mL/hr over 30 Minutes Intravenous Every 24 hours 09/04/18 1437 09/08/18 1311   09/04/18 1600  metroNIDAZOLE (FLAGYL) IVPB 500 mg  Status:  Discontinued     500 mg 100 mL/hr over 60 Minutes Intravenous Every 8 hours 09/04/18 1437 09/08/18 1311   09/02/18 0600  vancomycin (VANCOCIN) 500 mg in sodium chloride 0.9 % 100 mL IVPB  Status:  Discontinued     500 mg 100 mL/hr over 60 Minutes Intravenous Every 12 hours 08/09/2018 1619 09/03/18 1025   08/30/18 1600  vancomycin (VANCOCIN) IVPB 750 mg/150 ml premix  Status:  Discontinued  750 mg 150 mL/hr over 60 Minutes Intravenous Every 12 hours 08/30/18 1334 08/30/2018 1619   08/29/18 2300  vancomycin (VANCOCIN) IVPB 750 mg/150 ml premix  Status:  Discontinued     750 mg 150 mL/hr over 60 Minutes Intravenous Every 12 hours 08/29/18 1223 08/29/18 1327   08/29/18 2300  vancomycin (VANCOCIN) IVPB 1000 mg/200 mL premix  Status:  Discontinued     1,000 mg 200 mL/hr over 60 Minutes Intravenous Every 12 hours 08/29/18 1327 08/30/18 1139   08/29/18 1800  cefTAZidime (FORTAZ) 2 g in sodium chloride 0.9 % 100 mL IVPB  Status:  Discontinued     2 g 200 mL/hr over 30 Minutes Intravenous Every 8 hours 08/29/18 1228 09/04/18 1437   08/26/18 2300  vancomycin (VANCOCIN) IVPB 1000 mg/200 mL premix  Status:  Discontinued     1,000 mg 200 mL/hr over 60 Minutes Intravenous Every 12 hours 08/26/18 0923 08/29/18 1223   08/26/18 1000  vancomycin (VANCOCIN) 2,500  mg in sodium chloride 0.9 % 500 mL IVPB     2,500 mg 250 mL/hr over 120 Minutes Intravenous  Once 08/26/18 0923 08/26/18 1900   08/26/18 1000  cefTAZidime (FORTAZ) 1 g in sodium chloride 0.9 % 100 mL IVPB  Status:  Discontinued     1 g 200 mL/hr over 30 Minutes Intravenous Every 8 hours 08/26/18 0923 08/29/18 1228   08/12/2018 1014  ceFAZolin (ANCEF) 2-4 GM/100ML-% IVPB    Note to Pharmacy:  Roma Kayser  : cabinet override      08/13/2018 1014 08/09/2018 2229       Assessment/Plan Acute ischemic pontine stroke with stent placement in basilar to left proximal PCA Respiratory failure/ hypoxemia due to inability to protect airway from cva s/p trach Hypertensive Crisis Type 2 diabetes  S/p PEG placement 10/21 S/p exploratory laparotomy with open gastrostomy tube and I&D 10/28 S/p takeback for repeat I&D abdominal wall + exlap 11/1 - Continue broad spec IV abx; wet to dry dressings - change twice daily - Continue tube feeds  FEN: TF VTE: SCD's, ASA & Brilinta ID: Merrem for klebsiella and proteus Follow up: Dr. Hulen Skains   LOS: 27 days    Henreitta Cea , [facility] Surgery 09/10/2018, 10:05 AM Pager: [phone]

## 2018-09-10 NOTE — Progress Notes (Signed)
Placed pt back on vent. Pt's RR 35 and had increase work of breathing. RN aware. RT will continue to monitor as needed.

## 2018-09-10 NOTE — Progress Notes (Signed)
  Speech Language Pathology Treatment: [name] Speaking valve;Cognitive-Linquistic  Patient Details Name: Andrea [name] MRN: [id number] DOB: 1961-05-13 Today's Date: 09/10/2018 Time: 5053-9767 SLP Time Calculation (min) (ACUTE ONLY): 30 min  Assessment / Plan / Recommendation Clinical Impression  SLP made aware that pt was tolerating trach collar well this morning with request to attempt PMV as pt indicates that she wants to talk. Pt currently on trach collar with cuffed #4 Shiley trach, cuff inflated. SLP deflated cuff with nursing present however no secretions present with deflation. Pt tolerated deflation and placement of PMV wihtout any decline in vitals. Pt eager to talk when cuff deflated, PMV placed with pt able to direct air through upper airway and obtain voicing. Pt able to cough some secretions in to oral cavity with nursing suctioned. Pt with improved speech intelligibility at the word level ~ 90% and at the phrase level ~ 50%. Pt able to sustain phonation during singing task. Pt tearful and support given. Pt is appropriate for PMV placement with full supervision during therapies and with nursing/staff members. MD in to visit with pt and was agreeable to new order.      HPI HPI: 57 yo presented to ED with chest pain noted Rt hemiparesis in ED with acute Left paramedian brainstem infarct. Intubated 10/10 for angioplasty, extubated post procedure and reintubated. Repeat MRI with brainstem infarct extension and bil cerebellar infarcts. Peg/trach 08/14/2018.PMHx: HTN, DM, CKD      SLP Plan  Continue with current plan of care       Recommendations         Patient may use Passy-Muir Speech Valve: Intermittently with supervision;During all therapies with supervision PMSV Supervision: Full MD: Please consider changing trach tube to : Cuffless         Oral Care Recommendations: Oral care QID Follow up Recommendations: LTACH SLP Visit Diagnosis: Aphonia (R49.1);Cognitive  communication deficit (R41.841) Plan: Continue with current plan of care       [facility] 09/10/2018, 11:24 AM

## 2018-09-11 LAB — AEROBIC/ANAEROBIC CULTURE (SURGICAL/DEEP WOUND)

## 2018-09-11 LAB — AEROBIC/ANAEROBIC CULTURE W GRAM STAIN (SURGICAL/DEEP WOUND)

## 2018-09-11 LAB — BASIC METABOLIC PANEL
ANION GAP: 6 (ref 5–15)
BUN: 33 mg/dL — ABNORMAL HIGH (ref 6–20)
CHLORIDE: 114 mmol/L — AB (ref 98–111)
CO2: 24 mmol/L (ref 22–32)
CREATININE: 0.73 mg/dL (ref 0.44–1.00)
Calcium: 9.9 mg/dL (ref 8.9–10.3)
GFR calc Af Amer: >60 mL/min (ref >60)
GFR calc non Af Amer: >60 mL/min (ref >60)
Glucose, Bld: 140 mg/dL — ABNORMAL HIGH (ref 70–99)
Potassium: 4.7 mmol/L (ref 3.5–5.1)
SODIUM: 144 mmol/L (ref 135–145)

## 2018-09-11 LAB — CBC
HEMATOCRIT: 30.7 % — AB (ref 36.0–46.0)
HEMOGLOBIN: 8.6 g/dL — AB (ref 12.0–15.0)
MCH: 25 pg — ABNORMAL LOW (ref 26.0–34.0)
MCHC: 28 g/dL — ABNORMAL LOW (ref 30.0–36.0)
MCV: 89.2 fL (ref 80.0–100.0)
NRBC: 0.3 % — AB (ref 0.0–0.2)
Platelets: 484 10*3/uL — ABNORMAL HIGH (ref 150–400)
RBC: 3.44 MIL/uL — AB (ref 3.87–5.11)
RDW: 17.9 % — ABNORMAL HIGH (ref 11.5–15.5)
WBC: 11.5 10*3/uL — ABNORMAL HIGH (ref 4.0–10.5)

## 2018-09-11 LAB — GLUCOSE, CAPILLARY
GLUCOSE-CAPILLARY: 108 mg/dL — AB (ref 70–99)
GLUCOSE-CAPILLARY: 123 mg/dL — AB (ref 70–99)
GLUCOSE-CAPILLARY: 130 mg/dL — AB (ref 70–99)
GLUCOSE-CAPILLARY: 71 mg/dL (ref 70–99)
GLUCOSE-CAPILLARY: 93 mg/dL (ref 70–99)
Glucose-Capillary: 165 mg/dL — ABNORMAL HIGH (ref 70–99)

## 2018-09-11 NOTE — Evaluation (Signed)
Clinical/Bedside Swallow Evaluation Patient Details  Name: LYN JOENS MRN: [id number] Date of Birth: [birth year]  Today's Date: 09/11/2018 Time: SLP Start Time (ACUTE ONLY): 0840 SLP Stop Time (ACUTE ONLY): 0914 SLP Time Calculation (min) (ACUTE ONLY): 34 min  Past Medical History:  Past Medical History:  Diagnosis Date  . Diabetes mellitus   . Environmental allergies   . Fibroid   . High cholesterol   . Hx of cardiovascular stress test    a. ETT-MV 2/14:  EF 46% (normal by echo), low risk, no ischemia or scar  . Hx of echocardiogram    Echo 2/14: mild LVH, EF 55-60%, Gr 1 diast dysfn, mild LAE  . Hypertension   . Hyperthyroidism    Radioactive iodine ablation   Past Surgical History:  Past Surgical History:  Procedure Laterality Date  . [place], 2001   x 2  . ESOPHAGOGASTRODUODENOSCOPY N/A 09/04/2018   Procedure: ESOPHAGOGASTRODUODENOSCOPY (EGD);  Surgeon: Judeth Horn, MD;  Location: [place];  Service: General;  Laterality: N/A;  bedside  . GASTROSTOMY N/A 08/05/2018   Procedure: INSERTION OF GASTROSTOMY TUBE;  Surgeon: Judeth Horn, MD;  Location: [place];  Service: General;  Laterality: N/A;  . IR ANGIO INTRA EXTRACRAN SEL COM CAROTID INNOMINATE BILAT MOD SED  08/31/2018  . IR ANGIO VERTEBRAL SEL SUBCLAVIAN INNOMINATE UNI R MOD SED  08/23/2018  . IR CT HEAD LTD  08/25/2018  . IR INTRA CRAN STENT  08/24/2018  . LAPAROTOMY N/A 09/04/2018   Procedure: EXPLORATORY LAPAROTOMY;  Surgeon: Judeth Horn, MD;  Location: [place];  Service: General;  Laterality: N/A;  . LAPAROTOMY N/A 09/28/2018   Procedure: EXPLORATORY LAPAROTOMY;  Surgeon: Judeth Horn, MD;  Location: [place];  Service: General;  Laterality: N/A;  . PEG PLACEMENT N/A 08/25/2018   Procedure: PERCUTANEOUS ENDOSCOPIC GASTROSTOMY (PEG) PLACEMENT;  Surgeon: Judeth Horn, MD;  Location: [place];  Service: General;  Laterality: N/A;  . RADIOLOGY WITH ANESTHESIA N/A 08/24/2018   Procedure: IR WITH  ANESTHESIA;  Surgeon: Luanne Bras, MD;  Location: [place];  Service: Radiology;  Laterality: N/A;   HPI:  57 yo presented to ED with chest pain noted Rt hemiparesis in ED with acute Left paramedian brainstem infarct. Intubated 10/10 for angioplasty, extubated post procedure and reintubated. Repeat MRI with brainstem infarct extension and bil cerebellar infarcts. Peg/trach 08/25/2018.PMHx: HTN, DM, CKD   Assessment / Plan / Recommendation Clinical Impression  Initiated dysphagia therapy with good indication of future swallowing ability. Pt has some difficulty with controlling oral movement for manipulation; needs verbal cues to open close mouth for cup, spoon, straw. Swallow intermittent delayed, particularly if bolus is very small. Swallow consistently intiated but typically followed by wet vocal quality and or weak cough indicative of decreased airway protection. Pt appears to not have volitional control over inspiratory phase for appropriate cued cough and reflexive cough also relatively weak. Pt would benefit from structured intervention such as RMT. Will initiate and continue trials with expectation to complete objective swallowing test, likely FEES in the near future.  SLP Visit Diagnosis: Dysphagia, oropharyngeal phase (R13.12)    Aspiration Risk  Severe aspiration risk    Diet Recommendation NPO;Alternative means - long-term   Medication Administration: Via alternative means    Other  Recommendations Oral Care Recommendations: Oral care QID   Follow up Recommendations LTACH      Frequency and Duration min 2x/week  2 weeks       Prognosis Prognosis for Safe Diet Advancement: Good  Swallow Study   General HPI: 57 yo presented to ED with chest pain noted Rt hemiparesis in ED with acute Left paramedian brainstem infarct. Intubated 10/10 for angioplasty, extubated post procedure and reintubated. Repeat MRI with brainstem infarct extension and bil cerebellar infarcts.  Peg/trach 08/10/2018.PMHx: HTN, DM, CKD Type of Study: Bedside Swallow Evaluation Previous Swallow Assessment: none Diet Prior to this Study: NPO;PEG tube Temperature Spikes Noted: No Respiratory Status: Trach Collar Trach Size and Type: Cuff;#4;Inflated History of Recent Intubation: Yes Length of Intubations (days): 11 days Date extubated: 08/21/2018 Behavior/Cognition: Alert;Cooperative;Requires cueing Oral Cavity Assessment: Excessive secretions Oral Care Completed by SLP: Yes Oral Cavity - Dentition: Adequate natural dentition Self-Feeding Abilities: Total assist Patient Positioning: Partially reclined Baseline Vocal Quality: Breathy Volitional Cough: Cognitively unable to elicit Volitional Swallow: Able to elicit    Oral/Motor/Sensory Function Overall Oral Motor/Sensory Function: Moderate impairment(Coordination/initiation impairment) Facial ROM: Within Functional Limits Facial Symmetry: Within Functional Limits Facial Strength: Within Functional Limits Facial Sensation: Within Functional Limits Lingual ROM: Within Functional Limits Lingual Symmetry: Within Functional Limits Lingual Strength: Within Functional Limits   Ice Chips Ice chips: Impaired Presentation: Spoon Oral Phase Functional Implications: Prolonged oral transit Pharyngeal Phase Impairments: Wet Vocal Quality;Suspected delayed Swallow;Cough - Delayed   Thin Liquid Thin Liquid: Impaired Presentation: Straw;Cup Oral Phase Impairments: Reduced lingual movement/coordination Pharyngeal  Phase Impairments: Suspected delayed Swallow;Cough - Immediate;Multiple swallows    Nectar Thick Nectar Thick Liquid: Not tested   Honey Thick Honey Thick Liquid: Impaired Presentation: Spoon Pharyngeal Phase Impairments: Wet Vocal Quality;Throat Clearing - Immediate   Puree Puree: Not tested   Solid     Solid: Not tested     Herbie Baltimore, MA CCC-SLP  Acute Rehabilitation Services Pager [phone] Office  [phone]  Avett Reineck, [name] 09/11/2018,10:36 AM

## 2018-09-11 NOTE — Progress Notes (Signed)
NAME:  Andrea [name], MRN:  [id number], DOB:  09-11-1961, LOS: 28 ADMISSION DATE:  09/04/2018, CONSULTATION DATE:  09/04/2018 REFERRING MD:  Dr. Rory Percy, CHIEF COMPLAINT:  CP/ Acute CVA  Brief History   79 yoF w/poorly controlled HTN and IDDM presenting with CP and left arm pain w/BP 220/117. Initial CTH neg.  Cardiac workup negative for acute event thus far, cardiology following.  Code stroke initiated for R hemiplegia, dysarthria and right facial droop.  Out of window for TPA.  Requiring cleviprex for BP control.  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  Evolving pontine stroke on MRI. Intubated 10/10 for neuro IR s/p stenting and angioplasty with 80-90% patency.  Extubated post procedure but due to insufficency placed on BiPAP and brought to ICU.  Required reintubation and ultimately trach (10/21 JY).  Peg tube placed 10/21 by CCS.  Taken to OR 10/28 for open gastrostomy and I&D of abdominal wall abscess. 11/1 taken back to OR for repeat ex-lap.     Past Medical History  HTN, IDDM, hypothyroidism s/p ablation  Significant Hospital Events   10/9 present to [facility] ED -transferred to [facility] 10/9 basilar artery stenting 10/9 intubated urgently 6h post procedure for inability to protect airway. 10/10 CTA for declining exam - evolving pontine infarct, stent deemed to be patent 10/14 Brillinta stopped and ASA/ heparin started to in anticipation of tracheostomy on Monday. 10/14 through 10/19: Neuro exam has been stable sodium increasing in spite of free water via tube, providing supportive care awaiting trach 10/20: Sodium down to 151 from 156 clinically looks the same 10/21: Sodium down to 148, sputum culture sent for purulent tracheal secretions, awaiting trach and PEG.  Purulent bronchial secretions from right upper lobe sent for BAL 10/22: Tracheostomy and PEG completed on the 21st tolerated well.  Spiking low-grade fever white cells continued to climb starting empiric  vancomycin and ceftaz.  Sodium improved down to 146.  Blood pressure improving but still not at goal added hydrochlorothiazide.  Resumed Brilinta.  10/23: Sodium normal.  Backing down on free water.  Glycemic control challenging, increased basal NovoLog in addition to Lantus and sliding scale.  Still spiking fever, white blood cell count climbing, sending blood cultures.  Foley catheter being placed for pressure ulcer caused by pure wick device 10/27 Foul smelling dark trach secretions, PEG secretions  10/28 to OR for ex lap due to PEG dislodgement.  PEG replaced 10/29 remains on MV 2/2 tachypnea; family meeting 10/30 no weaning 2/2 to ongoing tachypnea; foul smelling drainage from abd site 10/31 no weaning, abx changed, family meeting 11/1 repeat Ex-lap for I&D of absces  Consults: date of consult/date signed off & final recs:  Cards 10/10 Neurology 10/10 PCCM 10/10 CCS 10/17  Procedures (surgical and bedside):  10/10 Neuro IR >> s/p stent and angioplasty of distal basilar artery  10/10 ETT for procedure 10/10 ETT (reintubated) >> removed 10/21: Size 6 tracheostomy placed by Dr. Nelda Marseille 10/26 changed to #4 trach cuffless  10/28 Ex lap with replacement of gastrostomy tube due to dislodgement 11/1 repeat Ex-lap with I&D of abdominal wall abscess   Significant Diagnostic Tests:  10/10  CTH >>  No acute intracranial abnormalities. 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 No definite acute thrombus  identified. Severe stenosis left posterior cerebral artery and moderate stenosis right posterior cerebral artery. Mild stenosis left MCA and moderate stenosis left MCA bifurcation. No significant carotid or vertebral artery stenosis in the neck. 10/10 MRI >> evolving pontine  infarct. 10/11 ECHO >> normal LVSF with severe LVH. 10/28 CT A / P > dislodged PEG tube outside of the peritoneum and not within the stomach.  Cholelithiasis. No bowel obstruction.  No hydronephrosis.  10/30 CT abd/pelvis >> Mild bilateral atelectasis.  Interval placement of new gastrostomy tube with tip and balloon within gastric lumen. Surgical drain is seen in the soft tissues in previous gastrostomy site. Small amount of gas and stranding is seen in the peritoneal fat anterior and inferior to the stomach which most likely is related to gastrostomy placement. Probable surgical wound seen in right upper quadrant of the anterior abdominal wall.  8.2 x 2.3 cm gas and fluid collection is seen in the anterior abdominal wall which is unchanged compared to prior exam. Possible abscess cannot be excluded.  Enlarged fibroid uterus.  Micro Data:  10/10 MRSA PCR >> negative 10/21 Sputum culture >> nl resp flora  10/23 Blood cultures x 2 >> negative 10/26 Blood cultures x 2 >> neg 10/28 MRSA PCR >> neg 10/27 Sputum culture >> normal flora 11/1 Abdominal abscess> kelbsiella, proteus, susceptible to imipenem, amp-sulbactam, ESBL  Antimicrobials:  10/10 cefazolin preop 10/22 Vancomycin >> 10/30 10/22 Ceftaz >> 10/31 10/31 ceftriaxone >> 11/4 10/31 flagyl >> 11/4 11/4 meropenem >>   Subjective:   Tolerated ATC all day, back on vent at night   Objective   Blood pressure (!) 149/77, pulse 71, temperature 98.8 F (37.1 C), temperature source Oral, resp. rate (!) 25, height _0  (1.626 m), weight 129.3 kg, last menstrual period 07/26/2018, SpO2 97 %.    Vent Mode: PRVC FiO2 (%):  [40 %-60 %] 40 % Set Rate:  [15 bmp] 15 bmp Vt Set:  [420 mL] 420 mL PEEP:  [5 cmH20] 5 cmH20 Plateau Pressure:  [11 cmH20] 11 cmH20   Intake/Output Summary (Last 24 hours) at 09/11/2018 0756 Last data filed at 09/11/2018 0600 Gross per 24 hour  Intake 3780.53 ml  Output 1750 ml  Net 2030.53 ml   Filed  Weights   09/08/18 0353 09/09/18 0500 09/10/18 0500  Weight: 128 kg 129 kg 129.3 kg   Examination:   General:  Awake on vent HENT: NCAT trach in place PULM: CTA B, vent supported breathing CV: RRR, no mgr GI: BS+, soft, nontender MSK: normal bulk and tone Neuro: awake, nods head appropriately, shrugs shoulder, slightly moves left foot     Assessment & Plan:   Acute respiratory failure/ hypoxemia due to inability to protect airway from cva -Expected airway difficulties with location of stroke s/p tracheostomy 10/21 -11/1 CXR personally reviewed: ? 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/p stent placement with extension of infarcts into the pontine and medullary spaces and small bilateral cerebellar infarcts   -Resulting in inadequate airway protection and quadriplegia P: Anti-platelets per Neurology PT  Abd wall abscess with polymicrobial drainage - s/p I&D 10/29 by CCS P: Continue meropenem through 11/9 then re-assess need  Hypertension P: Continue coreg, hydralazine, norvasc, HCTZ Prn anti-hypertensive for SBP > 180  Hypernatremia > resolved P: Continue free water as ordered to keep up with insensible loss Monitor  BMET and UOP Replace electrolytes as needed   Disposition / Summary of Today's Plan 09/11/18   Read above NEEDS LTACH  Best Practice  Diet: TFs Pain/Anxiety/Delirium protocol (if indicated): RASS goal 0  VAP protocol (if indicated): Ordered DVT prophylaxis: SCDs  GI prophylaxis: Famotidine Hyperglycemia protocol: SSI, lantus Mobility: Bedrest Code Status: Full  Family: I updated her sister Joanne Chars today  Roselie Awkward, MD [place] P[facility]M Pager: [phone] Cell: [phone] After 3pm or if no response, call [phone]

## 2018-09-11 NOTE — Progress Notes (Signed)
RN aware of  d/c CVC order. 

## 2018-09-11 NOTE — Progress Notes (Signed)
RT note: patient placed on 40% trach collar.  Currently tolerating well.  Will continue to monitor.  

## 2018-09-11 NOTE — Progress Notes (Signed)
  Speech Language Pathology Treatment: [name] Speaking valve  Patient Details Name: Andrea [name] MRN: [id number] DOB: 04-11-1961 Today's Date: 09/11/2018 Time: 4128-2081 SLP Time Calculation (min) (ACUTE ONLY): 49 min  Assessment / Plan / Recommendation Clinical Impression  Pt again on ATC this am, SLP deflated cuff without significant change in function, no mobilization of secretions needed. Placed PMSV for 5 minute intervals. At times, but RR increased to mid 20s, appeared more related to pts rapid rate of speech and resulting breathlessness rather than intolerance of PMSV. With head supported to midline and cues for rest breaks with speech, pt eventually able to wear PMSV continuously for over 10 minutes with no signs of air trapping. Breath support for phonation now adequate for intelligible phrase length speech. Cues for one breath one word were successful with model, but pt could not carry over to spontaneous communication. Dysarthria also present. Some signs of delirium as well, pt having difficulty naming family members, easily distracted and tangential. Says she "walked in the hall" and "if I could just get outside I could take care of myself." Appears pt does not have full awareness of deficits and memory may be impaired. Will continue efforts.   HPI HPI: 57 yo presented to ED with chest pain noted Rt hemiparesis in ED with acute Left paramedian brainstem infarct. Intubated 10/10 for angioplasty, extubated post procedure and reintubated. Repeat MRI with brainstem infarct extension and bil cerebellar infarcts. Peg/trach 08/13/2018.PMHx: HTN, DM, CKD      SLP Plan  Continue with current plan of care       Recommendations         Patient may use Passy-Muir Speech Valve: Intermittently with supervision;During all therapies with supervision PMSV Supervision: Full MD: Please consider changing trach tube to : Cuffless         Oral Care Recommendations: Oral care QID Follow up  Recommendations: LTACH SLP Visit Diagnosis: Aphonia (R49.1);Cognitive communication deficit [phone]) Plan: Continue with current plan of care       GO                [place]rop Shannahan, [name] 09/11/2018, 10:21 AM

## 2018-09-11 NOTE — Progress Notes (Signed)
STROKE TEAM PROGRESS NOTE   SUBJECTIVE (INTERVAL HISTORY) Pt RN and OT are at bedside. Pt sit in the converted hospital bed, on trach collar, tolerating well. Neuro stable no change. Working with OT for left side strength. Able to move toes and fingers. As per OT, she can squeeze left hand hard which I did not see today yet.   OBJECTIVE Vitals:   09/11/18 0400 09/11/18 0500 09/11/18 0600 09/11/18 0700  BP: (!) 148/80 (!) 141/77 (!) 156/83 (!) 149/77  Pulse: 85 71 85 71  Resp: (!) 27 (!) 29 (!) 28 (!) 25  Temp: 98.8 F (37.1 C)     TempSrc: Oral     SpO2: 97% 98% 98% 97%  Weight:      Height:        CBC:  Recent Labs  Lab 09/07/18 0459  09/10/18 0444 09/11/18 0622  WBC 12.1*   < > 11.2* 11.5*  NEUTROABS 8.9*  --   --   --   HGB 6.8*   < > 8.9* 8.6*  HCT 24.0*   < > 31.3* 30.7*  MCV 86.0   < > 88.9 89.2  PLT 479*   < > 497* 484*   < > = values in this interval not displayed.    Basic Metabolic Panel:  Recent Labs  Lab 09/24/2018 0613 09/06/18 0456  09/10/18 0444 09/11/18 0622  NA 149* 149*   < > 147* 144  K 3.8 4.1   < > 4.1 4.7  CL 119* 121*   < > 116* 114*  CO2 23 25   < > 26 24  GLUCOSE 110* 155*   < > 121* 140*  BUN 52* 55*   < > 38* 33*  CREATININE 1.28* 1.31*   < > 0.80 0.73  CALCIUM 11.1* 11.0*   < > 10.4* 9.9  MG 2.5*  --   --   --   --   PHOS 2.5 3.3  --   --   --    < > = values in this interval not displayed.    Lipid Panel:     Component Value Date/Time   CHOL 256 (H) 08/15/2018 0512   TRIG 248 (H) 08/15/2018 0512   HDL 35 (L) 08/15/2018 0512   CHOLHDL 7.3 08/15/2018 0512   VLDL 50 (H) 08/15/2018 0512   LDLCALC 171 (H) 08/15/2018 0512   HgbA1c:  Lab Results  Component Value Date   HGBA1C 11.4 (H) 08/15/2018   Urine Drug Screen:     Component Value Date/Time   LABOPIA NONE DETECTED 09/02/2018 1836   COCAINSCRNUR NONE DETECTED 08/18/2018 1836   LABBENZ NONE DETECTED 08/26/2018 1836   AMPHETMU NONE DETECTED 08/16/2018 1836   THCU NONE  DETECTED 08/15/2018 1836   LABBARB NONE DETECTED 08/24/2018 1836    Alcohol Level No results found for: ETH  IMAGING  Ct Angio Head W Or Wo Contrast Ct Angio Neck W Or Wo Contrast 08/15/2018 IMPRESSION:  1. Interval basilar to left proximal PCA stenting. The density of the stent walls precludes detection of in stent stenosis; there is a degree of wasting at the mid basilar segment. There is flow in the left PCA beyond the stent such that there is presumed stent patency.  2. Known lower pontine infarct. There are new small cerebellar infarcts since brain MRI yesterday.  3. Severe left P2 segment stenosis, also seen previously.  4. Moderate atheromatous narrowing in the proximal left MCA.    Ct Angio  Head W Or Wo Contrast Ct Angio Neck W Or Wo Contrast 09/04/2018 IMPRESSION:  1. 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 No definite acute thrombus identified.  2. Severe stenosis left posterior cerebral artery and moderate stenosis right posterior cerebral artery.  3. Mild stenosis left MCA and moderate stenosis left MCA bifurcation.  4. No significant carotid or vertebral artery stenosis in the neck.    Ct Head Wo Contrast 08/13/2018 IMPRESSION:  No acute intracranial abnormalities. Mild white matter changes likely due to small vessel ischemia.    Mr Brain Wo Contrast 08/16/2018 IMPRESSION:  1. Acute nonhemorrhagic infarct involving the left paramedian brainstem. The infarct crosses midline.  2. Other periventricular and subcortical white matter disease is moderately advanced for age. This likely reflects the sequela of chronic microvascular ischemia.  3. Tapering of the dens with prominent soft tissue pannus. 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    Ct Head Code Stroke Wo Contrast 09/02/2018  IMPRESSION:  1. No acute abnormality and no change from earlier today  2. ASPECTS is 10 3.    Cerebral  Angiogram 08/12/2018 S/P 4 vessel cerebral arteriogram RT CFA approach. 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 S/P stent assisted angioplasty of distal basilar artery with patency of 80 to 90 %. 2.Approx 70 % stenosis of LT ICA supraclinoid seg   MRI 08/15/18 1. Progressive acute pontine infarct. New patchy bilateral cerebellar infarction. New tiny left thalamic infarcts. 2. Preserved flow void in the stented basilar. 3. left facial/submandibular edema, please correlate with neck exam. Edited result: IMPRESSION: 1. Progressive acute pontine infarct. New patchy bilateral cerebellar infarction. New tiny left thalamic infarcts. 2. Preserved flow void in the stented basilar.  TTE - Left ventricle: The cavity size was normal. There was severe   concentric hypertrophy. Systolic function was normal. The   estimated ejection fraction was in the range of 60% to 65%. Wall   motion was normal; there were no regional wall motion   abnormalities. Doppler parameters are consistent with abnormal   left ventricular relaxation (grade 1 diastolic dysfunction). The   E/e&' ratio is <8, suggesting normal LV filling pressure. - Aortic valve: Trileaflet; mildly calcified leaflets.   Transvalvular velocity was minimally increased. There was no   regurgitation. Mean gradient (S): 12 mm Hg. - Mitral valve: Mildly thickened leaflets . There was trivial   regurgitation. - Left atrium: The atrium was normal in size. - Inferior vena cava: The vessel was dilated. The respirophasic   diameter changes were blunted (< 50%), consistent with elevated   central venous pressure. Impressions: - LVEF 60-65%, severe LVH, normal wall motion, grade 1 DD, normal   LV filling pressure, minimally increased aortic velocity without   signficant stenosis, trivial MR, normal LA size, dilated IVC.  CT abdomen and  pelvis 09/03/18 Mild bilateral posterior basilar subsegmental atelectasis. Interval placement of new gastrostomy tube with tip and balloon within gastric lumen. Surgical drain is seen in the soft tissues in previous gastrostomy site. Small amount of gas and stranding is seen in the peritoneal fat anterior and inferior to the stomach which most likely is related to gastrostomy placement. Probable surgical wound seen in right upper quadrant of the anterior abdominal wall. 8.2 x 2.3 cm gas and fluid collection is seen in the anterior abdominal wall which is unchanged compared to  prior exam. Possible abscess cannot be excluded. Enlarged fibroid uterus.  PHYSICAL EXAM  Temp:  [98.2 F (36.8 C)-99.3 F (37.4 C)] 98.8 F (37.1 C) (11/07 0400) Pulse Rate:  [66-90] 71 (11/07 0700) Resp:  [13-31] 25 (11/07 0700) BP: (129-170)/(70-91) 149/77 (11/07 0700) SpO2:  [95 %-100 %] 97 % (11/07 0700) FiO2 (%):  [40 %-60 %] 40 % (11/07 0316)  General -  Obese middle-aged African-American lady, not in distress. She has abdominal surgical wound and drainage from the abdominal wall   Ophthalmologic - fundi not visualized due to noncooperation.  Cardiovascular - not tachycardic currently in the 80s RRR  Neuro - post trach, on trach collar, no sedation. Eyes open, awake, alert. No ptosis or EOMI, denies diplopia.  PERRL. blinking to visual threat bilaterally. Right gaze nystagmus, can follow and track examiner across the room. Bilateral facial weakness, R>L, corneal, gag and cough present. Able to open mouth and protrude tongue slightly. On speaking valve, severe dysarthria and not able to be understood. Quadriplegia, did not move toes or fingers for me today. Grimaces to pain to the BUE. DTR diminished throughout. Muscle tone decreased. Coordination and gait not tested.    ASSESSMENT/PLAN Andrea [name] is a 57 y.o. female with history of difficult to control hypertension, insulin-dependent diabetes,  obesity, hypothyroidism status post ablation presenting with chest discomfort, right-sided weakness, slurred speech and right facial droop. She did not receive IV t-PA due to late presentation. S/P stent assisted angioplasty of distal basilar artery.  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 Worsening symptoms with extension of pontine/medullary infarcts with new small bilateral cerebellar infarcts without evidence of stent re-stenosis or occlusion  Resultant quadriplegia, on trach and peg  CT head - No acute intracranial abnormalities.  CTA H&N 08/06/2018 - Extensive atherosclerotic disease in the basilar with focal critical stenosis in the mid to distal basilar. No definite acute thrombus identified. Severe stenosis left posterior cerebral artery  MRI head - Acute nonhemorrhagic infarct involving the left paramedian brainstem.   CTA H&N 08/15/2018 - stent too dense to see BA lumen, but distal flow preserved. New small cerebellar infarcts since brain MRI yesterday.  Severe left P2 segment stenosis, also seen previously.   Repeat MRI - extension of b/l pontine and upper medullary infarcts with new b/l cerebellar infarcts.  2D Echo - EF 60-65%  LDL - 174  HgbA1c - 11.1  VTE prophylaxis - heparin subq  aspirin 81 mg daily prior to admission, now resumed on Brilinta 90 mg bid and ASA 81 mg daily.   Patient counseled to be compliant with her antithrombotic medications  Ongoing aggressive stroke risk factor management  Therapy recommendations:  LTACH  Disposition:  Pending - will wait once pt more coherent with speaking valve, need to discuss about further plan with her if she is competent with decision making.   Intracranial stenosis  BA mid to distal severe stenosis s/p BA stenting  Left PCA severe stenosis, R PCA moderate stenosis  Left MCA moderate stenosis  Uncontrolled stroke risk factors with HLD, HTN, DM  Non compliance  with meds at home  Respiratory failure  S/p trach 08/20/2018  On trach collar now  Working with speech therapist for speaking valve  CCM on board  Hypertension  Stable but fluctuate with position   BP goal 130-150   On po norvasc, Coreg 25 bid, and hydralazine 25mg  tid  Hyperlipidemia  Lipid lowering medication PTA:  Lipitor  20 mg daily  LDL 174, goal < 70  Increased to 80 mg daily  Continue statin at discharge  Diabetes  HgbA1c 11.4, goal < 7.0  Uncontrolled at home  Glucose stable  Decrease Lantus to 20U bid   Decrease basal NovoLog to 6U Q4h  SSI  CBG monitoring  Low grade fever with leukocytosis  Tmax 101.6 -> afebrile  Leukocytosis - 14.0->12.1->9.9->11.2->11.5  UA WBC 21-50  CXR unremarkable, improving from prior  On meropenem now  Blood cultures negative 10/26 ;  Respiratory cultures - mixed flora   Abdominal abscess culture showed proteus mirabilis  CCM and trauma on board  Hypernatremia and elevated Cre/BUN  Na 149->148->150->147->144  Cre 1.31->1.09->0.86->0.80->0.73  Continue 1/2NS @ 50  On TF @ 45  On free water 300mg  Q4h  Continue BMP monitoring  Dysphagia s/p PEG complicated by abdominal wall abscess  Continue tube feeding @ 45 cc/h  PEG done 08/20/2018  Removed and replaced dislodged PEG 08/13/2018  CT showed Abdominal wall abscess - s/p open surgical drainage 09/02/2018 persistent foul odor discharge. Reexploration 09/17/2018  Culture showed peroteus mirabilis  On meropenum now   Trauma surgery following, appreciate recs  Anemia   Hb 6.8->PRBC->7.1->6.8->8.2->8.4->8.9->8.6  Could be due to iron deficiency and acute uterine bleeding and recent surgeries  Iron panel showed iron deficiency  Put on iron pills  PRBC transfusion 1 U 09/17/2018 and 2U 09/07/18  CBC monitoring  Abnormal uterine bleeding, improved  Has been following with GYN  Was on megace in the past  CT abd/pelvis 09/03/18 - enlarged  fibroid uterus  resumed megace 80mg  bid  Continue ASA and brilinta for now  Close CBC monitoring  Other Stroke Risk Factors  Obesity, Body mass index is 48.93 kg/m., recommend weight loss, diet and exercise as appropriate   Other Active Problems  Hypokalemia - on supplement, now resolved  Thrombocytosis - Plts - 489 -> 553-> 535->479->492->497->484 likely due to anemia  Hospital day # 28  This patient is critically ill due to worsening brainstem infarct, BA stenosis s/p stenting, hyperglycemia, hypertensive, fever, anemia, abdominal wall abscess s/p drainage, dislodged PEG and hypernatremiaand at significant risk of neurological worsening, death form recurrent stroke, hemorrhagic conversion, CHF, seizure, aspiration, hemorrhagic shock. This patient's care requires constant monitoring of vital signs, hemodynamics, respiratory and cardiac monitoring, review of multiple databases, neurological assessment, discussion with family, other specialists and medical decision making of high complexity. I spent 30 minutes of neurocritical care time in the care of this patient.   Rosalin Hawking, MD PhD Stroke Neurology 09/11/2018 12:45 PM    To contact Stroke Continuity provider, please refer to [email or web address] After hours, contact General Neurology

## 2018-09-12 LAB — GLUCOSE, CAPILLARY
GLUCOSE-CAPILLARY: 77 mg/dL (ref 70–99)
GLUCOSE-CAPILLARY: 79 mg/dL (ref 70–99)
GLUCOSE-CAPILLARY: 99 mg/dL (ref 70–99)
Glucose-Capillary: 90 mg/dL (ref 70–99)
Glucose-Capillary: 90 mg/dL (ref 70–99)
Glucose-Capillary: 122 mg/dL — ABNORMAL HIGH (ref 70–99)
Glucose-Capillary: 84 mg/dL (ref 70–99)

## 2018-09-12 LAB — CBC
HEMATOCRIT: 31.6 % — AB (ref 36.0–46.0)
HEMOGLOBIN: 9 g/dL — AB (ref 12.0–15.0)
MCH: 25.1 pg — AB (ref 26.0–34.0)
MCHC: 28.5 g/dL — AB (ref 30.0–36.0)
MCV: 88.3 fL (ref 80.0–100.0)
PLATELETS: 502 10*3/uL — AB (ref 150–400)
RBC: 3.58 MIL/uL — AB (ref 3.87–5.11)
RDW: 18 % — AB (ref 11.5–15.5)
WBC: 11.3 10*3/uL — ABNORMAL HIGH (ref 4.0–10.5)
nRBC: 0.3 % — ABNORMAL HIGH (ref 0.0–0.2)

## 2018-09-12 LAB — BASIC METABOLIC PANEL
ANION GAP: 5 (ref 5–15)
BUN: 29 mg/dL — AB (ref 6–20)
CALCIUM: 10 mg/dL (ref 8.9–10.3)
CO2: 26 mmol/L (ref 22–32)
CREATININE: 0.73 mg/dL (ref 0.44–1.00)
Chloride: 112 mmol/L — ABNORMAL HIGH (ref 98–111)
GFR calc Af Amer: >60 mL/min (ref >60)
GLUCOSE: 95 mg/dL (ref 70–99)
Potassium: 4.3 mmol/L (ref 3.5–5.1)
Sodium: 143 mmol/L (ref 135–145)

## 2018-09-12 MED ORDER — INSULIN GLARGINE 100 UNIT/ML ~~LOC~~ SOLN
18.0000 [IU] | Freq: Two times a day (BID) | SUBCUTANEOUS | Status: DC
  Administered 2018-09-12 – 2018-09-25 (×24): 18 [IU] via SUBCUTANEOUS
  Filled 2018-09-12 (×26): qty 0.18

## 2018-09-12 MED ORDER — INSULIN ASPART 100 UNIT/ML ~~LOC~~ SOLN
4.0000 [IU] | SUBCUTANEOUS | Status: DC
  Administered 2018-09-13 – 2018-09-25 (×52): 4 [IU] via SUBCUTANEOUS

## 2018-09-12 NOTE — Progress Notes (Signed)
Patient ID: LACEE GREY, female   DOB: 10/22/1961, 57 y.o.   MRN: [id number] G tube working Midline and LLQ I&D sites much cleaner  S/p PEG placement 10/21 S/p exploratory laparotomy with open gastrostomy tube and I&D 10/28 S/p takeback for repeat I&D abdominal wall + exlap 11/1  Continue wound care ABX through 11/9 - I D/W CCM  Georganna Skeans, MD, MPH, FACS Trauma: [phone] General Surgery: [phone]  09/12/2018 9:35 AM

## 2018-09-12 NOTE — Progress Notes (Signed)
Occupational Therapy Progress Note  Utilized TLB during session and completed first tilt with excellent participation. PMSV used throughout session with BP dropping after @ 8 min of standing, however, pt not symptomatic. Pt able to demonstrate activation of LLE in standing and increased movement of LUE. Pt very vocal throughout session and joking with therapists. Pt emotional at times, expressing gratitude for everyone who was taking care of her. Encourage nsg to tilt pt at least 2-3 times/day. Nsg educated on how to tilt pt. Nsg given resource manual.     09/12/18 1300  OT Visit Information  Last OT Received On 09/12/18  Assistance Needed +2  PT/OT/SLP Co-Evaluation/Treatment Yes  Reason for Co-Treatment Complexity of the patient's impairments (multi-system involvement);For patient/therapist safety  OT goals addressed during session ADL's and self-care;Strengthening/ROM  History of Present Illness 57 yo presented to ED with chest pain noted Rt hemiparesis in ED with acute Left paramedian brainstem infarct. Intubated 10/10 for angioplasty, extubated post procedure and reintubated. Repeat MRI with brainstem infarct extension and bil cerebellar infarcts. Peg/trach with return to vent 08/24/2018. 10/22-29 on trach collar, return to vent 10/29. 10/28 I & D of abdominal wall abscess with placement of penrose drain and G-tube, 11/1 repeat ex lap. 11/7 return to trach collar.  PMHx: HTN, DM, CKD  Precautions  Precautions Fall  Precaution Comments Peg/trach  Pain Assessment  Pain Assessment Faces  Faces Pain Scale 4  Pain Location shoudlers; neck with ROM  Pain Descriptors / Indicators Grimacing  Pain Intervention(s) Limited activity within patient's tolerance  Cognition  Arousal/Alertness Awake/alert  Behavior During Therapy WFL for tasks assessed/performed  Overall Cognitive Status Impaired/Different from baseline  Area of Impairment Attention;Safety/judgement;Awareness;Problem solving  Current  Attention Level Sustained  Following Commands Follows one step commands consistently  Safety/Judgement Decreased awareness of safety;Decreased awareness of deficits  Awareness Emergent  Problem Solving Slow processing  General Comments pt oriented to place, month and year; saying she is a "baby" becuae sheis unable to do anything for herself; tearful at times when talking about her children; appropriate at times and very humorous; - joking with therapists and MD  Upper Extremity Assessment  Upper Extremity Assessment RUE deficits/detail;LUE deficits/detail  RUE Deficits / Details no active movment;flaccid; inferior subluxation of shoulder  LUE Deficits / Details increased active movement today with active gripping of hand; spontaneous movement LUE against gravity  LUE Coordination decreased fine motor;decreased gross motor  Lower Extremity Assessment  Lower Extremity Assessment Defer to PT evaluation  ADL  Overall ADL's  Needs assistance/impaired  Eating/Feeding Details (indicate cue type and reason) stating she wants to eat crackers, peanut butter, jello and chocolate or strawberry ice cream  Grooming Maximal assistance  Grooming Details (indicate cue type and reason) hand over hand to wipe chin; pt with activation of LUE for functional activity  Bed Mobility  Overal bed mobility Needs Assistance  General bed mobility comments used foot board to slide pt toward [facility]  Balance  Overall balance assessment Needs assistance  Sitting-balance support Feet supported;Bilateral upper extremity supported  Sitting balance-Leahy Scale Zero  Sitting balance - Comments Pt able to extend head in sitting and activate trunk to shift weight anteriorly and posteriorly minimally  Standing balance-Leahy Scale Zero  Standing balance comment utilized tilt bed and straps  Vision- Assessment  Vision Assessment? Vision impaired- to be further tested in functional context  Transfers  Sit to Stand Total assist;+2  safety/equipment  General transfer comment total assist with use of tilt bed to transition  to standing 52min in full standing. Pt with stable VSS and enjoying being upright with exercises performed in standing.   General Comments  General comments (skin integrity, edema, etc.) Pt very excited about standing "I'm taller than you"; emotional at times, however appropriately eotional at times when talking about how difficult things ar efor her to do; pt appreciative and thanking everyone for their work with her; "I love you more than you will ever know"  Exercises  Exercises Other exercises;General Upper Extremity  General Exercises - Upper Extremity  Shoulder Flexion PROM;Both;10 reps;Supine;AAROM  Elbow Flexion PROM;Both;10 reps;AAROM  Shoulder ABduction PROM;Both;10 reps;Supine;AAROM  Elbow Extension PROM;Both;10 reps;Supine;AAROM;Left  Wrist Flexion PROM;Both;10 reps;Supine;AAROM;Left  Wrist Extension PROM;Both;10 reps;Supine;AAROM  Digit Composite Flexion PROM;Both;5 reps;Supine;AAROM;Left  Composite Extension PROM;Both;5 reps;Supine;AAROM;Left  Other Exercises  Other Exercises facilitation of trunk adn head control in standing  Other Exercises Stood x 8 min @ 60 degrees with tilt bed  OT - End of Session  Equipment Utilized During Treatment Oxygen  Activity Tolerance Patient tolerated treatment well  Patient left in bed;with call bell/phone within reach;with nursing/sitter in room  Nurse Communication Mobility status;Other (comment) (use of tilt bed)  OT Assessment/Plan  OT Plan Discharge plan remains appropriate  OT Visit Diagnosis Other abnormalities of gait and mobility (R26.89);Muscle weakness (generalized) (M62.81);Low vision, both eyes (H54.2);Feeding difficulties (R63.3);Other symptoms and signs involving cognitive function;Hemiplegia and hemiparesis;Pain  Hemiplegia - Right/Left Right  Hemiplegia - dominant/non-dominant Non-Dominant  Hemiplegia - caused by Nontraumatic  intracerebral hemorrhage;Cerebral infarction  Pain - part of body Hip;Hand;Arm;Knee;Leg  OT Frequency (ACUTE ONLY) Min 2X/week  Follow Up Recommendations SNF;Supervision/Assistance - 24 hour  OT Equipment Other (comment) (TBA)  AM-PAC OT "6 Clicks" Daily Activity Outcome Measure  Help from another person eating meals? 1  Help from another person taking care of personal grooming? 1  Help from another person toileting, which includes using toliet, bedpan, or urinal? 1  Help from another person bathing (including washing, rinsing, drying)? 1  Help from another person to put on and taking off regular upper body clothing? 1  Help from another person to put on and taking off regular lower body clothing? 1  6 Click Score 6  ADL G Code Conversion CN  OT Goal Progression  Progress towards OT goals Progressing toward goals  Acute Rehab OT Goals  Patient Stated Goal per family for pt to get stronger  OT Goal Formulation With patient/family  Time For Goal Achievement 09/26/18  ADL Goals  Pt Will Perform Grooming with max assist;bed level  Pt Will Perform Upper Body Bathing with max assist;bed level  Pt/caregiver will Perform Home Exercise Program Increased ROM;With written HEP provided;Both right and left upper extremity  Additional ADL Goal #1 Pt will sit EOB with +2 Max A x 10 min in preparation for functional activities.  OT Time Calculation  OT Start Time (ACUTE ONLY) 1030  OT Stop Time (ACUTE ONLY) 1120  OT Time Calculation (min) 50 min  OT General Charges  $OT Visit 1 Visit  OT Treatments  $Therapeutic Activity 23-37 mins  Maurie Boettcher, OT/L   Acute OT Clinical Specialist [place] Pager [phone] Office [phone]

## 2018-09-12 NOTE — Progress Notes (Signed)
RT note: for RT worklist, showing for trach change to be completed today.  Current trach was placed on 10/28.  Two weeks would be 11/11.  Will continue to monitor.

## 2018-09-12 NOTE — Progress Notes (Signed)
NAME:  Andrea [name], MRN:  [id number], DOB:  11-08-1960, LOS: 23 ADMISSION DATE:  08/24/2018, CONSULTATION DATE:  08/26/2018 REFERRING MD:  Dr. Rory Percy, CHIEF COMPLAINT:  CP/ Acute CVA  Brief History   81 yoF w/poorly controlled HTN and IDDM presenting with CP and left arm pain w/BP 220/117. Initial CTH neg.  Cardiac workup negative for acute event thus far, cardiology following.  Code stroke initiated for R hemiplegia, dysarthria and right facial droop.  Out of window for TPA.  Requiring cleviprex for BP control.  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  Evolving pontine stroke on MRI. Intubated 10/10 for neuro IR s/p stenting and angioplasty with 80-90% patency.  Extubated post procedure but due to insufficency placed on BiPAP and brought to ICU.  Required reintubation and ultimately trach (10/21 JY).  Peg tube placed 10/21 by CCS.  Taken to OR 10/28 for open gastrostomy and I&D of abdominal wall abscess. 11/1 taken back to OR for repeat ex-lap.     Past Medical History  HTN, IDDM, hypothyroidism s/p ablation  Significant Hospital Events   10/9 present to [facility] ED -transferred to [facility] 10/9 basilar artery stenting 10/9 intubated urgently 6h post procedure for inability to protect airway. 10/10 CTA for declining exam - evolving pontine infarct, stent deemed to be patent 10/14 Brillinta stopped and ASA/ heparin started to in anticipation of tracheostomy on Monday. 10/14 through 10/19: Neuro exam has been stable sodium increasing in spite of free water via tube, providing supportive care awaiting trach 10/20: Sodium down to 151 from 156 clinically looks the same 10/21: Sodium down to 148, sputum culture sent for purulent tracheal secretions, awaiting trach and PEG.  Purulent bronchial secretions from right upper lobe sent for BAL 10/22: Tracheostomy and PEG completed on the 21st tolerated well.  Spiking low-grade fever white cells continued to climb starting empiric  vancomycin and ceftaz.  Sodium improved down to 146.  Blood pressure improving but still not at goal added hydrochlorothiazide.  Resumed Brilinta.  10/23: Sodium normal.  Backing down on free water.  Glycemic control challenging, increased basal NovoLog in addition to Lantus and sliding scale.  Still spiking fever, white blood cell count climbing, sending blood cultures.  Foley catheter being placed for pressure ulcer caused by pure wick device 10/27 Foul smelling dark trach secretions, PEG secretions  10/28 to OR for ex lap due to PEG dislodgement.  PEG replaced 10/29 remains on MV 2/2 tachypnea; family meeting 10/30 no weaning 2/2 to ongoing tachypnea; foul smelling drainage from abd site 10/31 no weaning, abx changed, family meeting 11/1 repeat Ex-lap for I&D of absces  Consults: date of consult/date signed off & final recs:  Cards 10/10 Neurology 10/10 PCCM 10/10 CCS 10/17  Procedures (surgical and bedside):  10/10 Neuro IR >> s/p stent and angioplasty of distal basilar artery  10/10 ETT for procedure 10/10 ETT (reintubated) >> removed 10/21: Size 6 tracheostomy placed by Dr. Nelda Marseille 10/26 changed to #4 trach cuffless  10/28 Ex lap with replacement of gastrostomy tube due to dislodgement 11/1 repeat Ex-lap with I&D of abdominal wall abscess   Significant Diagnostic Tests:  10/10  CTH >>  No acute intracranial abnormalities. 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 No definite acute thrombus  identified. Severe stenosis left posterior cerebral artery and moderate stenosis right posterior cerebral artery. Mild stenosis left MCA and moderate stenosis left MCA bifurcation. No significant carotid or vertebral artery stenosis in the neck. 10/10 MRI >> evolving pontine  infarct. 10/11 ECHO >> normal LVSF with severe LVH. 10/28 CT A / P > dislodged PEG tube outside of the peritoneum and not within the stomach.  Cholelithiasis. No bowel obstruction.  No hydronephrosis.  10/30 CT abd/pelvis >> Mild bilateral atelectasis.  Interval placement of new gastrostomy tube with tip and balloon within gastric lumen. Surgical drain is seen in the soft tissues in previous gastrostomy site. Small amount of gas and stranding is seen in the peritoneal fat anterior and inferior to the stomach which most likely is related to gastrostomy placement. Probable surgical wound seen in right upper quadrant of the anterior abdominal wall.  8.2 x 2.3 cm gas and fluid collection is seen in the anterior abdominal wall which is unchanged compared to prior exam. Possible abscess cannot be excluded.  Enlarged fibroid uterus.  Micro Data:  10/10 MRSA PCR >> negative 10/21 Sputum culture >> nl resp flora  10/23 Blood cultures x 2 >> negative 10/26 Blood cultures x 2 >> neg 10/28 MRSA PCR >> neg 10/27 Sputum culture >> normal flora 11/1 Abdominal abscess> kelbsiella, proteus, susceptible to imipenem, amp-sulbactam, ESBL  Antimicrobials:  10/10 cefazolin preop 10/22 Vancomycin >> 10/30 10/22 Ceftaz >> 10/31 10/31 ceftriaxone >> 11/4 10/31 flagyl >> 11/4 11/4 meropenem >>   Subjective:   Tolerated ATC all day, back on vent at night   Objective   Blood pressure 113/70, pulse 74, temperature 97.8 F (36.6 C), temperature source Oral, resp. rate (!) 24, height _0  (1.626 m), weight 129.3 kg, last menstrual period 07/26/2018, SpO2 100 %.    FiO2 (%):  [28 %] 28 %   Intake/Output Summary (Last 24 hours) at 09/12/2018 1122 Last data filed at 09/12/2018 0800 Gross per 24 hour  Intake 1466.12 ml  Output 2550 ml  Net -1083.88 ml   Filed Weights   09/08/18 0353 09/09/18 0500 09/10/18 0500  Weight: 128 kg 129 kg 129.3 kg   Examination:   General:  Awake on vent HENT: NCAT trach  in place PULM: CTA B, vent supported breathing CV: RRR, no mgr GI: BS+, soft, nontender MSK: normal bulk and tone Neuro: awake, nods head appropriately, shrugs shoulder, slightly moves left foot     Assessment & Plan:   Acute respiratory failure/ hypoxemia due to inability to protect airway from cva -Expected airway difficulties with location of stroke s/p tracheostomy 10/21 -11/1 CXR personally reviewed: ? 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/p stent placement with extension of infarcts into the pontine and medullary spaces and small bilateral cerebellar infarcts   -Resulting in inadequate airway protection and quadriplegia P: Anti-platelets per neurology PT  Abd wall abscess with polymicrobial drainage - s/p I&D 10/29 by CCS P: COntinue meropenem through 11/9 then re-assess need  Hypertension P: Continue coreg, hydralazine, norvasc, HCTZ Prn anti-hypertensive for SBP > 180  Hypernatremia > resolved P: Continue free water   Disposition / Summary of Today's Plan 09/12/18   Step down tomorrow if remains off vent Needs LTACH  Best Practice  Diet: TFs Pain/Anxiety/Delirium protocol (if indicated): RASS goal 0  VAP protocol (if indicated): Ordered DVT prophylaxis: SCDs  GI prophylaxis: Famotidine Hyperglycemia protocol: SSI, lantus Mobility: Bedrest Code Status: Full  Family: I updated her sister Joanne Chars 11/7  Roselie Awkward, MD [place] PCCM Pager: [phone] Cell: [phone] After 3pm or if no response, call [phone]

## 2018-09-12 NOTE — Progress Notes (Signed)
  Speech Language Pathology Treatment: Dysphagia;Cognitive-Linquistic;Passy Muir Speaking valve  Patient Details Name: Andrea [name] MRN: [id number] DOB: Mar 20, 1961 Today's Date: 09/12/2018 Time: 6754-4920 SLP Time Calculation (min) (ACUTE ONLY): 45 min  Assessment / Plan / Recommendation Clinical Impression  Pt doing beautifully with PMSV, able to over articulate one word at a time, clear oral secretions, take a deep breath for improved volume. About 75% intelligible at conversation level. Pt verbalizes a feeling of not knowing what is real and what isnt. We worked on basic orientation, initiated [employer] device, pt needed max cues to coordinate exhalation through device. Volitional oral and respiratory control are just becoming so much more coordinated. Andrea [name] was able to consume puree without signs of aspiration, but did have some immediate coughing with water. Recommend FEES at next available opportunity.   HPI HPI: 57 yo presented to ED with chest pain noted Rt hemiparesis in ED with acute Left paramedian brainstem infarct. Intubated 10/10 for angioplasty, extubated post procedure and reintubated. Repeat MRI with brainstem infarct extension and bil cerebellar infarcts. Peg/trach 08/12/2018.PMHx: HTN, DM, CKD      SLP Plan  Continue with current plan of care;Other (Comment)(FEES)       Recommendations  Diet recommendations: NPO      Patient may use Passy-Muir Speech Valve: During all therapies with supervision;Intermittently with supervision;During PO intake/meals PMSV Supervision: Full MD: Please consider changing trach tube to : Cuffless         Oral Care Recommendations: Oral care QID Follow up Recommendations: LTACH SLP Visit Diagnosis: Dysphagia, oropharyngeal phase (R13.12) Plan: Continue with current plan of care;Other (Comment)(FEES)       GO               Herbie [place], MA [place] Pager [phone] Office  [phone]  Lynann Beaver 09/12/2018, 3:30 PM

## 2018-09-12 NOTE — Progress Notes (Signed)
Patient CBG 68. Orange juice given via Peg. Patient asymptomatic and stable. Repeat CBG was 112. RN will continue to monitor.

## 2018-09-12 NOTE — Progress Notes (Signed)
Physical Therapy Treatment Patient Details Name: Andrea [name] MRN: [id number] DOB: Mar 09, 1961 Today's Date: 09/12/2018    History of Present Illness 57 yo presented to ED with chest pain noted Rt hemiparesis in ED with acute Left paramedian brainstem infarct. Intubated 10/10 for angioplasty, extubated post procedure and reintubated. Repeat MRI with brainstem infarct extension and bil cerebellar infarcts. Peg/trach with return to vent 08/14/2018. 10/22-29 on trach collar, return to vent 10/29. 10/28 I & D of abdominal wall abscess with placement of penrose drain and G-tube, 11/1 repeat ex lap. 11/7 return to trach collar.  PMHx: HTN, DM, CKD    PT Comments    Pt pleasant and talking with PMSV throughout session. Pt thankful for care and excited about standing with tilt bed. Pt able to tolerate tilt well with VSS until 11 min when pt with drop in BP but pt asymptomatic and stable with return to supine. Pt able to activate left quad in standing, perform LUE movement and accurately assess touch all extremities during activity. ROM for bil LE performed and pt encouraged to continue progressing and participating with therapy. Pt stating "he's crazy and coming in here to mess with me" in reference to Dr.Xu.   Initial supine BP 148/72 30degrees 137/71, 45 degrees 150/97, 80 degrees 126/82,  85/37 after 11 min standing, 163/147 with return to supine   Follow Up Recommendations  LTACH;Supervision/Assistance - 24 hour     Equipment Recommendations  Hospital bed;Wheelchair (measurements PT);Other (comment)(hoyer lift)    Recommendations for Other Services       Precautions / Restrictions Precautions Precautions: Fall Precaution Comments: Peg/trach    Mobility  Bed Mobility Overal bed mobility: Needs Assistance             General bed mobility comments: used foot board to slide pt toward [facility]  Transfers Overall transfer level: Needs assistance   Transfers: Sit to/from Stand Sit to  Stand: Total assist;+2 safety/equipment         General transfer comment: total assist with use of tilt bed to transition to standing 49min in full standing. Pt with stable VSS and enjoying being upright with exercises performed in standing.   Ambulation/Gait             General Gait Details: unable   Stairs             Wheelchair Mobility    Modified Rankin (Stroke Patients Only) Modified Rankin (Stroke Patients Only) Pre-Morbid Rankin Score: No symptoms Modified Rankin: Severe disability     Balance Overall balance assessment: Needs assistance         Standing balance support: Bilateral upper extremity supported Standing balance-Leahy Scale: Zero Standing balance comment: utilized tilt bed and straps                            Cognition Arousal/Alertness: Awake/alert Behavior During Therapy: WFL for tasks assessed/performed Overall Cognitive Status: Impaired/Different from baseline Area of Impairment: Attention;Following commands                   Current Attention Level: Sustained   Following Commands: Follows one step commands consistently Safety/Judgement: Decreased awareness of deficits   Problem Solving: Slow processing;Requires verbal cues;Requires tactile cues General Comments: pt on trach collar with PMSV stating correct month, year, place. Lacks awareness of deficits as she notes her arms and legs are strong but still wants to move. Following commands for LUE and LLE as able  and neck movement      Exercises General Exercises - Lower Extremity Quad Sets: AROM;15 reps;Standing;Left Heel Slides: AAROM;PROM;10 reps;Supine;Left;Right(PROM on RLE)    General Comments        Pertinent Vitals/Pain Pain Score: 4  Pain Location: grimace with mention of bil shoulder tightness in standing Pain Descriptors / Indicators: Grimacing Pain Intervention(s): Limited activity within patient's tolerance;Repositioned;Monitored during  session    Home Living                      Prior Function            PT Goals (current goals can now be found in the care plan section) Progress towards PT goals: Progressing toward goals    Frequency    Min 2X/week      PT Plan Current plan remains appropriate    Co-evaluation PT/OT/SLP Co-Evaluation/Treatment: Yes Reason for Co-Treatment: Complexity of the patient's impairments (multi-system involvement);For patient/therapist safety PT goals addressed during session: Mobility/safety with mobility;Balance;Strengthening/ROM        AM-PAC PT "6 Clicks" Daily Activity  Outcome Measure  Difficulty turning over in bed (including adjusting bedclothes, sheets and blankets)?: Unable Difficulty moving from lying on back to sitting on the side of the bed? : Unable Difficulty sitting down on and standing up from a chair with arms (e.g., wheelchair, bedside commode, etc,.)?: Unable Help needed moving to and from a bed to chair (including a wheelchair)?: Total Help needed walking in hospital room?: Total Help needed climbing 3-5 steps with a railing? : Total 6 Click Score: 6    End of Session Equipment Utilized During Treatment: Oxygen;Other (comment)(tilt bed) Activity Tolerance: Patient tolerated treatment well Patient left: in bed;with call bell/phone within reach;with nursing/sitter in room Nurse Communication: Mobility status;Need for lift equipment;Other (comment)(tilts on bed) PT Visit Diagnosis: Other abnormalities of gait and mobility (R26.89);Muscle weakness (generalized) (M62.81);Other symptoms and signs involving the nervous system (R29.898);Hemiplegia and hemiparesis Hemiplegia - caused by: Cerebral infarction     Time: 6168-3729 PT Time Calculation (min) (ACUTE ONLY): 38 min  Charges:  $Therapeutic Activity: 8-22 mins                     [place], PT Acute Rehabilitation Services Pager: [phone] Office: [phone]    [name] B  [name] 09/12/2018, 11:18 AM

## 2018-09-12 NOTE — Progress Notes (Signed)
Patient remained stable throughout the night on trach collar. Patient able to speak some without passy valve, although encouraged not to. RN will continue to monitor.

## 2018-09-12 NOTE — Progress Notes (Signed)
STROKE TEAM PROGRESS NOTE   SUBJECTIVE (INTERVAL HISTORY) Pt RN and PT/OT are at bedside. Pt is standing on the convertible bed, with speaking valve, she can speak more clear than 2 days ago. Her LUE can flex in the elbow joint and finger grasp 2/5. LLE able to bend on the knee slightly, wiggle toes as will.   OBJECTIVE Vitals:   09/12/18 1200 09/12/18 1206 09/12/18 1300 09/12/18 1400  BP: 109/62  105/64 132/65  Pulse: 62  65 74  Resp: (!) 24  (!) 26 (!) 21  Temp:  99.4 F (37.4 C)    TempSrc:  Oral    SpO2: 97%  94% 99%  Weight:      Height:        CBC:  Recent Labs  Lab 09/07/18 0459  09/11/18 0622 09/12/18 0550  WBC 12.1*   < > 11.5* 11.3*  NEUTROABS 8.9*  --   --   --   HGB 6.8*   < > 8.6* 9.0*  HCT 24.0*   < > 30.7* 31.6*  MCV 86.0   < > 89.2 88.3  PLT 479*   < > 484* 502*   < > = values in this interval not displayed.    Basic Metabolic Panel:  Recent Labs  Lab 09/06/18 0456  09/11/18 0622 09/12/18 0550  NA 149*   < > 144 143  K 4.1   < > 4.7 4.3  CL 121*   < > 114* 112*  CO2 25   < > 24 26  GLUCOSE 155*   < > 140* 95  BUN 55*   < > 33* 29*  CREATININE 1.31*   < > 0.73 0.73  CALCIUM 11.0*   < > 9.9 10.0  PHOS 3.3  --   --   --    < > = values in this interval not displayed.    Lipid Panel:     Component Value Date/Time   CHOL 256 (H) 08/15/2018 0512   TRIG 248 (H) 08/15/2018 0512   HDL 35 (L) 08/15/2018 0512   CHOLHDL 7.3 08/15/2018 0512   VLDL 50 (H) 08/15/2018 0512   LDLCALC 171 (H) 08/15/2018 0512   HgbA1c:  Lab Results  Component Value Date   HGBA1C 11.4 (H) 08/15/2018   Urine Drug Screen:     Component Value Date/Time   LABOPIA NONE DETECTED 08/16/2018 1836   COCAINSCRNUR NONE DETECTED 08/20/2018 1836   LABBENZ NONE DETECTED 08/11/2018 1836   AMPHETMU NONE DETECTED 08/26/2018 1836   THCU NONE DETECTED 08/16/2018 1836   LABBARB NONE DETECTED 08/08/2018 1836    Alcohol Level No results found for: ETH  IMAGING  Ct Angio Head W  Or Wo Contrast Ct Angio Neck W Or Wo Contrast 08/15/2018 IMPRESSION:  1. Interval basilar to left proximal PCA stenting. The density of the stent walls precludes detection of in stent stenosis; there is a degree of wasting at the mid basilar segment. There is flow in the left PCA beyond the stent such that there is presumed stent patency.  2. Known lower pontine infarct. There are new small cerebellar infarcts since brain MRI yesterday.  3. Severe left P2 segment stenosis, also seen previously.  4. Moderate atheromatous narrowing in the proximal left MCA.    Ct Angio Head W Or Wo Contrast Ct Angio Neck W Or Wo Contrast 09/03/2018 IMPRESSION:  1. 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 No  definite acute thrombus identified.  2. Severe stenosis left posterior cerebral artery and moderate stenosis right posterior cerebral artery.  3. Mild stenosis left MCA and moderate stenosis left MCA bifurcation.  4. No significant carotid or vertebral artery stenosis in the neck.    Ct Head Wo Contrast 08/28/2018 IMPRESSION:  No acute intracranial abnormalities. Mild white matter changes likely due to small vessel ischemia.    Mr Brain Wo Contrast 08/08/2018 IMPRESSION:  1. Acute nonhemorrhagic infarct involving the left paramedian brainstem. The infarct crosses midline.  2. Other periventricular and subcortical white matter disease is moderately advanced for age. This likely reflects the sequela of chronic microvascular ischemia.  3. Tapering of the dens with prominent soft tissue pannus. 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    Ct Head Code Stroke Wo Contrast 08/27/2018  IMPRESSION:  1. No acute abnormality and no change from earlier today  2. ASPECTS is 10 3.    Cerebral Angiogram 08/11/2018 S/P 4 vessel cerebral arteriogram RT CFA approach. Findings  1.Severe stenosis of distal basilar artery 90 % ,associated  with mod to severe ASVD of the mid basilar artery and Lt ANt cerebellar A. S/P stent assisted angioplasty of distal basilar artery with patency of 80 to 90 %. 2.Approx 70 % stenosis of LT ICA supraclinoid seg   MRI 08/15/18 1. Progressive acute pontine infarct. New patchy bilateral cerebellar infarction. New tiny left thalamic infarcts. 2. Preserved flow void in the stented basilar. 3. left facial/submandibular edema, please correlate with neck exam. Edited result: IMPRESSION: 1. Progressive acute pontine infarct. New patchy bilateral cerebellar infarction. New tiny left thalamic infarcts. 2. Preserved flow void in the stented basilar.  TTE - Left ventricle: The cavity size was normal. There was severe   concentric hypertrophy. Systolic function was normal. The   estimated ejection fraction was in the range of 60% to 65%. Wall   motion was normal; there were no regional wall motion   abnormalities. Doppler parameters are consistent with abnormal   left ventricular relaxation (grade 1 diastolic dysfunction). The   E/e&' ratio is <8, suggesting normal LV filling pressure. - Aortic valve: Trileaflet; mildly calcified leaflets.   Transvalvular velocity was minimally increased. There was no   regurgitation. Mean gradient (S): 12 mm Hg. - Mitral valve: Mildly thickened leaflets . There was trivial   regurgitation. - Left atrium: The atrium was normal in size. - Inferior vena cava: The vessel was dilated. The respirophasic   diameter changes were blunted (< 50%), consistent with elevated   central venous pressure. Impressions: - LVEF 60-65%, severe LVH, normal wall motion, grade 1 DD, normal   LV filling pressure, minimally increased aortic velocity without   signficant stenosis, trivial MR, normal LA size, dilated IVC.  CT abdomen and pelvis 09/03/18 Mild bilateral posterior basilar subsegmental atelectasis. Interval placement of new gastrostomy tube with tip and balloon within  gastric lumen. Surgical drain is seen in the soft tissues in previous gastrostomy site. Small amount of gas and stranding is seen in the peritoneal fat anterior and inferior to the stomach which most likely is related to gastrostomy placement. Probable surgical wound seen in right upper quadrant of the anterior abdominal wall. 8.2 x 2.3 cm gas and fluid collection is seen in the anterior abdominal wall which is unchanged compared to prior exam. Possible abscess cannot be excluded. Enlarged fibroid uterus.  PHYSICAL EXAM  Temp:  [97.7 F (36.5 C)-99.4 F (37.4 C)] 99.4 F (37.4 C) (11/08 1206) Pulse Rate:  [62-103]  74 (11/08 1400) Resp:  [16-31] 21 (11/08 1400) BP: (105-170)/(62-147) 132/65 (11/08 1400) SpO2:  [94 %-100 %] 99 % (11/08 1400) FiO2 (%):  [28 %] 28 % (11/08 1200)  General -  Andrea [name], on trach collar, not in distress. She has abdominal surgical wound and drainage from the abdominal wall, dressing clean  Ophthalmologic - fundi not visualized due to noncooperation.  Cardiovascular - regular rate and rhythm  Neuro - post trach, on trach collar, eyes open, awake, alert. No ptosis or EOMI, denies diplopia.  PERRL. blinking to visual threat bilaterally. Right gaze nystagmus, can follow and track examiner across the room. Bilateral facial weakness, R>L, corneal, gag and cough present. Able to open mouth and protrude tongue slightly. On speaking valve, moderate dysarthria but able to talk in sentences and making jokes. Quadriplegia except LUE bicep 2+/5 and finger grasp 2/5 and LLE knee flexion 2/5 and toe DF/PF 3+/5. Grimaces to pain to the BLEs. DTR diminished throughout. Muscle tone decreased. Coordination and gait not tested.    ASSESSMENT/PLAN Andrea [name] is a 57 y.o. female with history of difficult to control hypertension, insulin-dependent diabetes, obesity, hypothyroidism status post ablation presenting with chest discomfort,  right-sided weakness, slurred speech and right facial droop. She did not receive IV t-PA due to late presentation. S/P stent assisted angioplasty of distal basilar artery.  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 Worsening symptoms with extension of pontine/medullary infarcts with new small bilateral cerebellar infarcts without evidence of stent re-stenosis or occlusion  Resultant quadriplegia, on trach and peg  CT head - No acute intracranial abnormalities.  CTA H&N 08/08/2018 - Extensive atherosclerotic disease in the basilar with focal critical stenosis in the mid to distal basilar. No definite acute thrombus identified. Severe stenosis left posterior cerebral artery  MRI head - Acute nonhemorrhagic infarct involving the left paramedian brainstem.   CTA H&N 08/15/2018 - stent too dense to see BA lumen, but distal flow preserved. New small cerebellar infarcts since brain MRI yesterday.  Severe left P2 segment stenosis, also seen previously.   Repeat MRI - extension of b/l pontine and upper medullary infarcts with new b/l cerebellar infarcts.  2D Echo - EF 60-65%  LDL - 174  HgbA1c - 11.1  VTE prophylaxis - heparin subq  aspirin 81 mg daily prior to admission, now resumed on Brilinta 90 mg bid and ASA 81 mg daily.   Patient counseled to be compliant with her antithrombotic medications  Ongoing aggressive stroke risk factor management  Therapy recommendations:  LTACH  Disposition:  Pending - will wait once pt more improved with speaking valve, need to discuss about further plan with her   Intracranial stenosis  BA mid to distal severe stenosis s/p BA stenting  Left PCA severe stenosis, R PCA moderate stenosis  Left MCA moderate stenosis  Uncontrolled stroke risk factors with HLD, HTN, DM  Non compliance with meds at home  Respiratory failure  S/p trach 09/01/2018  On trach collar now  Working with speech therapist for  speaking valve  CCM on board  Hypertension  Stable but fluctuate with position   BP goal 130-150   On po norvasc, Coreg 25 bid, and hydralazine 25mg  tid  Hyperlipidemia  Lipid lowering medication PTA:  Lipitor 20 mg daily  LDL 174, goal < 70  Increased to 80 mg daily  Continue statin at discharge  Diabetes  HgbA1c 11.4, goal < 7.0  Uncontrolled at  home  Glucose stable on insulin   Decrease Lantus again today to 18U bid   Decrease basal NovoLog again today to 4U Q4h  SSI  CBG monitoring  Low grade fever with leukocytosis  Tmax 101.6 -> afebrile  Leukocytosis - 14.0->12.1->9.9->11.2->11.5->11.3  UA WBC 21-50  CXR unremarkable, improving from prior  On meropenem now  Blood cultures negative 10/26 ;  Respiratory cultures - mixed flora   Abdominal abscess culture showed proteus mirabilis  CCM and trauma on board  Hypernatremia and elevated Cre/BUN  Na 149->148->150->147->144->143  Cre 1.31->1.09->0.86->0.80->0.73->0.73  Continue 1/2NS @ 50  On TF @ 45  On free water 300mg  Q4h  Continue BMP monitoring  Dysphagia s/p PEG complicated by abdominal wall abscess  Continue tube feeding @ 45 cc/h  PEG done 08/18/2018  Removed and replaced dislodged PEG 08/15/2018  CT showed Abdominal wall abscess - s/p open surgical drainage 09/04/2018 persistent foul odor discharge. Reexploration 09/09/2018  Culture showed peroteus mirabilis  On meropenum now   Trauma surgery following, appreciate recs  Anemia   Hb 6.8->PRBC->7.1->6.8->8.2->8.4->8.9->8.6->9.0  Could be due to iron deficiency and acute uterine bleeding and recent surgeries  Iron panel showed iron deficiency  Put on iron pills  PRBC transfusion 1 U 09/13/2018 and 2U 09/07/18  CBC monitoring  Abnormal uterine bleeding, improved  Has been following with GYN  Was on megace in the past  CT abd/pelvis 09/03/18 - enlarged fibroid uterus  resumed megace 80mg  bid  Continue ASA and  brilinta for now  Close CBC monitoring  Other Stroke Risk Factors  Obesity, Body mass index is 48.93 kg/m., recommend weight loss, diet and exercise as appropriate   Other Active Problems  Hypokalemia - on supplement, now resolved  Thrombocytosis - Plts - 489 -> 553-> 535->479->492->497->484->502 likely due to anemia  Hospital day # 29  This patient is critically ill due to worsening brainstem infarct, BA stenosis s/p stenting, hyperglycemia, hypertensive, fever, anemia, abdominal wall abscess s/p drainage, dislodged PEG and hypernatremiaand at significant risk of neurological worsening, death form recurrent stroke, hemorrhagic conversion, CHF, seizure, aspiration, hemorrhagic shock. This patient's care requires constant monitoring of vital signs, hemodynamics, respiratory and cardiac monitoring, review of multiple databases, neurological assessment, discussion with family, other specialists and medical decision making of high complexity. I spent 30 minutes of neurocritical care time in the care of this patient.   Rosalin Hawking, MD PhD Stroke Neurology 09/12/2018 2:55 PM    To contact Stroke Continuity provider, please refer to [email or web address] After hours, contact General Neurology

## 2018-09-12 NOTE — Progress Notes (Signed)
Inpatient Diabetes Program Recommendations  AACE/ADA: New Consensus Statement on Inpatient Glycemic Control (2015)  Target Ranges:  Prepandial:   less than 140 mg/dL      Peak postprandial:   less than 180 mg/dL (1-2 hours)      Critically ill patients:  140 - 180 mg/dL   Results for Andrea [name], Andrea [name] (MRN [id number]) as of 09/12/2018 11:10  Ref. Range 09/09/2018 23:57 09/10/2018 04:23 09/10/2018 08:40 09/10/2018 12:47 09/10/2018 17:01 09/10/2018 19:53 09/10/2018 21:01  Glucose-Capillary Latest Ref Range: 70 - 99 mg/dL 136 (H)  9 units NOVOLOG 108 (H)  6 units NOVOLOG  115 (H)  6 units NOVOLOG +  20 units LANTUS at 9am  139 (H)  9 units NOVOLOG  71  6 units NOVOLOG  68 (L) 112 (H)     20 units LANTUS at 11pm   Results for Andrea [name], Andrea [name] (MRN [id number]) as of 09/12/2018 11:10  Ref. Range 09/10/2018 23:54 09/11/2018 03:50 09/11/2018 08:56 09/11/2018 12:32 09/11/2018 16:46 09/11/2018 20:10  Glucose-Capillary Latest Ref Range: 70 - 99 mg/dL 108 (H)  0 units NOVOLOG  130 (H)  0 units NOVOLOG  123 (H)  9 units NOVOLOG +  20 units LANTUS  165 (H)  10 units NOVOLOG  93  6 units NOVOLOG  71  0 units NOVOLOG +  20 units LANTUS at 10pm    Results for Andrea [name], Andrea [name] (MRN [id number]) as of 09/12/2018 11:10  Ref. Range 09/12/2018 03:00 09/12/2018 08:06  Glucose-Capillary Latest Ref Range: 70 - 99 mg/dL 90 90  6 units NOVOLOG +  20 units LANTUS     Home DM Meds: Lantus 27 units QHS       Metformin 1000 mg BID  Current Orders: Lantus 20 units BID      Novolog Resistant Correction Scale/ SSI (0-20 units) Q4 hours      Novolog 6 units Q4 hours      Getting Tube feeds Glucerna 45cc/hour.  Had Hypoglycemic event at 8pm on 11/06 after getting Novolog 6 units at 4pm for Tube feed coverage (CBG when this dose was administered was only 71 mg/dl).  Note that NO Novolog was administered to patient at 8pm last night, Midnight last PM, and 4am today.  Lantus was administered  as ordered.    MD- Please consider the following in-hospital insulin adjustments:  1. Reduce Lantus slightly to 18 units BID (10% reduction)  2. Reduce Novolog Tube feed coverage slightly to: Novolog 4 units Q4 hours     --Will follow patient during hospitalization--  Wyn Quaker RN, MSN, CDE Diabetes Coordinator Inpatient Glycemic Control Team Team Pager: [phone] (8a-5p)

## 2018-09-12 NOTE — Progress Notes (Signed)
Daily Progress Note   Patient Name: Andrea [name]       Date: 09/12/2018 DOB: May 27, 1961  Age: 57 y.o. MRN#: [id number] Attending Physician: Andrea Hawking, MD Primary Care Physician: Andrea Hefty, DO Admit Date: 08/10/2018  Reason for Consultation/Follow-up: Establishing goals of care  Subjective: Patient in bed with staff working with patient. Andrea [name] wa recently downsized and per nursing, she has been off the ventilator for over 24 hours and on trach collar. PMV in place, and she attempts to speak with an audible voice noted. Could only understand the word "no" when asked if she has pain.  Her foley has been removed, and per nursing she "stood" by using lift bed. Plans for transition out of ICU to stepdown soon, and then to LTAC.     ROS  Length of Stay: 29  Current Medications: Scheduled Meds:  . amLODipine  10 mg Per Tube Daily  . aspirin  81 mg Per Tube Daily  . atorvastatin  80 mg Per Tube q1800  . carvedilol  25 mg Per Tube BID WC  . chlorhexidine gluconate (MEDLINE KIT)  15 mL Mouth Rinse BID  . Chlorhexidine Gluconate Cloth  6 each Topical Daily  . docusate  100 mg Per Tube Daily  . feeding supplement (PRO-STAT SUGAR FREE 64)  60 mL Per Tube BID  . free water  300 mL Per Tube Q4H  . hydrALAZINE  25 mg Per Tube Q8H  . insulin aspart  0-20 Units Subcutaneous Q4H  . insulin aspart  6 Units Subcutaneous Q4H  . insulin glargine  20 Units Subcutaneous BID  . liver oil-zinc oxide   Topical QID  . mouth rinse  15 mL Mouth Rinse 10 times per day  . megestrol  80 mg Oral BID  . multivitamin  15 mL Per Tube Daily  . nystatin  5 mL Oral QID  . pantoprazole sodium  40 mg Per Tube Daily  . thiamine  100 mg Per Tube Daily  . ticagrelor  90 mg Per Tube BID    Continuous  Infusions: . sodium chloride 10 mL/hr at 09/12/18 1200  . feeding supplement (GLUCERNA 1.2 CAL) 45 mL/hr at 09/12/18 0600  . meropenem (MERREM) IV 1 g (09/12/18 1313)    PRN Meds: sodium chloride, acetaminophen (TYLENOL) oral liquid 160 mg/5  mL, albuterol, bisacodyl, diphenhydrAMINE-zinc acetate, fentaNYL (SUBLIMAZE) injection, hydrALAZINE, HYDROcodone-acetaminophen, labetalol, ondansetron (ZOFRAN) IV, sennosides  Physical Exam  Constitutional: No distress.  Pulmonary/Chest: Effort normal.  Trach with PMV in place.  Neurological: She is alert.  Skin: Skin is warm and dry.            Vital Signs: BP 109/62 (BP Location: Left Arm)   Pulse 62   Temp 99.4 F (37.4 C) (Oral)   Resp (!) 24   Ht 5' 4"  (1.626 m)   Wt 129.3 kg   LMP 07/26/2018   SpO2 97%   BMI 48.93 kg/m  SpO2: SpO2: 97 % O2 Device: O2 Device: Tracheostomy Collar O2 Flow Rate: O2 Flow Rate (L/min): 5 L/min  Intake/output summary:   Intake/Output Summary (Last 24 hours) at 09/12/2018 1351 Last data filed at 09/12/2018 1200 Gross per 24 hour  Intake 1686.12 ml  Output 2550 ml  Net -863.88 ml   LBM: Last BM Date: 09/11/18 Baseline Weight: Weight: 122.5 kg Most recent weight: Weight: 129.3 kg       Palliative Assessment/Data: PPS: 10%      Patient Active Problem List   Diagnosis Date Noted  . Goals of care, counseling/discussion   . Palliative care by specialist   . Advanced care planning/counseling discussion   . Sepsis with acute respiratory failure without septic shock ([place])   . Acute respiratory failure ([place])   . Tracheostomy present ([place])   . Tracheostomy status ([place])   . Hypertensive urgency 08/11/2018  . CKD (chronic kidney disease), stage III ([place]) 08/23/2018  . Acute ischemic stroke ([place]) 08/05/2018  . Basilar artery stenosis with infarction ([place]) 08/20/2018  . Respiratory insufficiency   . Low back pain 01/29/2017  . Cholelithiasis without cholecystitis 01/18/2017  . Anemia, iron  deficiency 01/17/2017  . Abdominal pain   . Hyperglycemia   . Increased anion gap metabolic acidosis   . Weight loss 04/09/2016  . Headache 04/09/2016  . Fibroids 04/14/2014  . Abnormal uterine bleeding 04/14/2014  . Unequal leg length 04/16/2013  . Lightheadedness 12/17/2012  . Chest pain 12/14/2012  . Morbid obesity ([place]) 12/29/2011  . High cholesterol   . 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 ANXIETY 02/20/2010  . GERD 01/30/2010  . THYROID NODULE 01/17/2010  . THYROMEGALY 01/16/2010  . DEPRESSION 01/16/2010  . Insulin-requiring or dependent type II diabetes mellitus ([place]) 09/18/2007  . Essential hypertension 09/18/2007    Palliative Care Assessment & Plan   Patient Profile: 57 y.o. female  with past medical history of diabetes, HTN, heart failure, CKD, anxiety, depression, hyperthyroid admitted on 08/07/2018 with symptoms of slurring speech, R facial droop, difficulty breathing. Workup eventually revealed evolving pontine stroke, occluded basilar artery (underwent angiogram with stenting). Post-op she remained hypertensive and followup CT showed progressing infarction into her brainstem and new cerebellar infarcts- she required intubation with eventual trach and PEG on 10/21. She was progressing with therapies and off of vent for a few days, however, began to decline again with lethargy, fevers. Purulent drainage from her PEG noted around 10/28. CT scan showed dislodged PEG tube with abdominal wall thickening and air, but no fluid collection or abscess, however, patient was very tender and there was foul smelling drainage from the wound. She went to OR 10/28 for I&D and replacement of G-tube. Op note shows that G-tube tip was found to be in the subcutaneous tissue, penrose drain was placed into a subcutaneous abscess. Post op, remained on vent support. Minimal progression in PT,  less participative. Palliative medicine consulted for [place].   Assessment/Recommendations/Plan   Spoke  with Andrea [name], she states she has been kept updated and has no questions. She is pleased with her sister's progress.   PMT will continue to follow.    Goals of Care and Additional Recommendations:  Limitations on Scope of Treatment: Full Scope Treatment    Prognosis:   Unable to determine  Discharge Planning:  To Be Determined  Care plan was discussed with patient's sister, Andrea [name] and patient's RN.  Thank you for allowing the Palliative Medicine Team to assist in the care of this patient.   Total Time 35 mins Prolonged Time Billed no      Greater than 50%  of this time was spent counseling and coordinating care related to the above assessment and plan.    Please contact Palliative Medicine Team phone at [phone] for questions and concerns.

## 2018-09-13 LAB — GLUCOSE, CAPILLARY
GLUCOSE-CAPILLARY: 96 mg/dL (ref 70–99)
GLUCOSE-CAPILLARY: 132 mg/dL — AB (ref 70–99)
Glucose-Capillary: 84 mg/dL (ref 70–99)
Glucose-Capillary: 120 mg/dL — ABNORMAL HIGH (ref 70–99)
Glucose-Capillary: 123 mg/dL — ABNORMAL HIGH (ref 70–99)

## 2018-09-13 LAB — BASIC METABOLIC PANEL
Anion gap: 8 (ref 5–15)
BUN: 29 mg/dL — ABNORMAL HIGH (ref 6–20)
CHLORIDE: 107 mmol/L (ref 98–111)
CO2: 24 mmol/L (ref 22–32)
CREATININE: 0.68 mg/dL (ref 0.44–1.00)
Calcium: 9.9 mg/dL (ref 8.9–10.3)
GFR calc Af Amer: >60 mL/min (ref >60)
GFR calc non Af Amer: >60 mL/min (ref >60)
GLUCOSE: 96 mg/dL (ref 70–99)
Potassium: 4.5 mmol/L (ref 3.5–5.1)
SODIUM: 139 mmol/L (ref 135–145)

## 2018-09-13 LAB — CBC
HCT: 34.7 % — ABNORMAL LOW (ref 36.0–46.0)
HEMOGLOBIN: 10.2 g/dL — AB (ref 12.0–15.0)
MCH: 25.7 pg — AB (ref 26.0–34.0)
MCHC: 29.4 g/dL — ABNORMAL LOW (ref 30.0–36.0)
MCV: 87.4 fL (ref 80.0–100.0)
PLATELETS: 543 10*3/uL — AB (ref 150–400)
RBC: 3.97 MIL/uL (ref 3.87–5.11)
RDW: 18.2 % — ABNORMAL HIGH (ref 11.5–15.5)
WBC: 10.2 10*3/uL (ref 4.0–10.5)
nRBC: 0.2 % (ref 0.0–0.2)

## 2018-09-13 MED ORDER — PROPOFOL 1000 MG/100ML IV EMUL: 5.0000 ug/kg/min | INTRAVENOUS | Status: DC

## 2018-09-13 NOTE — Progress Notes (Signed)
Appreciate care of Neurology, PCCM and surgery for this patient.   FM continues to follow and will be happy to resume care once clinical status improves.   Lovenia Kim MD [facility] PGY-3

## 2018-09-13 NOTE — Progress Notes (Signed)
NAME:  Andrea [name], MRN:  [id number], DOB:  October 02, 1961, LOS: 50 ADMISSION DATE:  08/22/2018, CONSULTATION DATE:  08/30/2018 REFERRING MD:  Dr. Rory Percy, CHIEF COMPLAINT:  CP/ Acute CVA  Brief History   22 yoF w/poorly controlled HTN and IDDM presenting with CP and left arm pain w/BP 220/117. Initial CTH neg.  Cardiac workup negative for acute event thus far, cardiology following.  Code stroke initiated for R hemiplegia, dysarthria and right facial droop.  Out of window for TPA.  Requiring cleviprex for BP control.  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  Evolving pontine stroke on MRI. Intubated 10/10 for neuro IR s/p stenting and angioplasty with 80-90% patency.  Extubated post procedure but due to insufficency placed on BiPAP and brought to ICU.  Required reintubation and ultimately trach (10/21 JY).  Peg tube placed 10/21 by CCS.  Taken to OR 10/28 for open gastrostomy and I&D of abdominal wall abscess. 11/1 taken back to OR for repeat ex-lap.     Past Medical History  HTN, IDDM, hypothyroidism s/p ablation  Significant Hospital Events   10/9 present to [facility] ED -transferred to [facility] 10/9 basilar artery stenting 10/9 intubated urgently 6h post procedure for inability to protect airway. 10/10 CTA for declining exam - evolving pontine infarct, stent deemed to be patent 10/14 Brillinta stopped and ASA/ heparin started to in anticipation of tracheostomy on Monday. 10/14 through 10/19: Neuro exam has been stable sodium increasing in spite of free water via tube, providing supportive care awaiting trach 10/20: Sodium down to 151 from 156 clinically looks the same 10/21: Sodium down to 148, sputum culture sent for purulent tracheal secretions, awaiting trach and PEG.  Purulent bronchial secretions from right upper lobe sent for BAL 10/22: Tracheostomy and PEG completed on the 21st tolerated well.  Spiking low-grade fever white cells continued to climb starting empiric  vancomycin and ceftaz.  Sodium improved down to 146.  Blood pressure improving but still not at goal added hydrochlorothiazide.  Resumed Brilinta.  10/23: Sodium normal.  Backing down on free water.  Glycemic control challenging, increased basal NovoLog in addition to Lantus and sliding scale.  Still spiking fever, white blood cell count climbing, sending blood cultures.  Foley catheter being placed for pressure ulcer caused by pure wick device 10/27 Foul smelling dark trach secretions, PEG secretions  10/28 to OR for ex lap due to PEG dislodgement.  PEG replaced 10/29 remains on MV 2/2 tachypnea; family meeting 10/30 no weaning 2/2 to ongoing tachypnea; foul smelling drainage from abd site 10/31 no weaning, abx changed, family meeting 11/1 repeat Ex-lap for I&D of absces  Consults: date of consult/date signed off & final recs:  Cards 10/10 Neurology 10/10 PCCM 10/10 CCS 10/17  Procedures (surgical and bedside):  10/10 Neuro IR >> s/p stent and angioplasty of distal basilar artery  10/10 ETT for procedure 10/10 ETT (reintubated) >> removed 10/21: Size 6 tracheostomy placed by Dr. Nelda Marseille 10/26 changed to #4 trach cuffless  10/28 Ex lap with replacement of gastrostomy tube due to dislodgement 11/1 repeat Ex-lap with I&D of abdominal wall abscess   Significant Diagnostic Tests:  10/10  CTH >>  No acute intracranial abnormalities. 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 No definite acute thrombus  identified. Severe stenosis left posterior cerebral artery and moderate stenosis right posterior cerebral artery. Mild stenosis left MCA and moderate stenosis left MCA bifurcation. No significant carotid or vertebral artery stenosis in the neck. 10/10 MRI >> evolving pontine  infarct. 10/11 ECHO >> normal LVSF with severe LVH. 10/28 CT A / P > dislodged PEG tube outside of the peritoneum and not within the stomach.  Cholelithiasis. No bowel obstruction.  No hydronephrosis.  10/30 CT abd/pelvis >> Mild bilateral atelectasis.  Interval placement of new gastrostomy tube with tip and balloon within gastric lumen. Surgical drain is seen in the soft tissues in previous gastrostomy site. Small amount of gas and stranding is seen in the peritoneal fat anterior and inferior to the stomach which most likely is related to gastrostomy placement. Probable surgical wound seen in right upper quadrant of the anterior abdominal wall.  8.2 x 2.3 cm gas and fluid collection is seen in the anterior abdominal wall which is unchanged compared to prior exam. Possible abscess cannot be excluded.  Enlarged fibroid uterus.  Micro Data:  10/10 MRSA PCR >> negative 10/21 Sputum culture >> nl resp flora  10/23 Blood cultures x 2 >> negative 10/26 Blood cultures x 2 >> neg 10/28 MRSA PCR >> neg 10/27 Sputum culture >> normal flora 11/1 Abdominal abscess> kelbsiella, proteus, susceptible to imipenem, amp-sulbactam, ESBL  Antimicrobials:  10/10 cefazolin preop 10/22 Vancomycin >> 10/30 10/22 Ceftaz >> 10/31 10/31 ceftriaxone >> 11/4 10/31 flagyl >> 11/4 11/4 meropenem >>   Subjective:   48 hours off mechanical ventilatory support.  Tolerating trach collar.  [place] critical care will see again on level #2019   Objective   Blood pressure (!) 152/78, pulse 73, temperature 98.7 F (37.1 C), temperature source Axillary, resp. rate 15, height _0  (1.626 m), weight 134.6 kg, last menstrual period 07/26/2018, SpO2 95 %.    FiO2 (%):  [28 %] 28 %   Intake/Output Summary (Last 24 hours) at 09/13/2018 1010 Last data filed at 09/13/2018 0600 Gross per 24 hour  Intake 853.67 ml  Output 1700 ml  Net -846.33 ml   Filed Weights   09/09/18 0500 09/10/18 0500 09/13/18 0500  Weight: 129 kg  129.3 kg 134.6 kg   Examination:   General: Obese female currently on trach collar for greater than 48 hours HEENT: Trach was unremarkable Neuro: Some movement of foot struck his shoulder  CV: Sounds are regular PULM: even/non-labored, lungs bilaterally throughout VZ:CHYI, non-tender, bsx4 active  Extremities: warm/dry, 2+ edema  Skin: no rashes or lesions      Assessment & Plan:   Acute respiratory failure/ hypoxemia due to inability to protect airway from cva -Expected airway difficulties with location of stroke s/p tracheostomy 10/21 -11/1 CXR personally reviewed: ? 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/p stent placement with extension of infarcts into the pontine and medullary spaces and small bilateral cerebellar infarcts   -Resulting in inadequate airway protection and quadriplegia P: Antiplatelets per neurology Continue physical therapy  Abd wall abscess with polymicrobial drainage - s/p I&D 10/29 by CCS P:  Currently on meropenem with stop date in place  Hypertension P: Continue antihypertensive  PRN antihypertensives for systolic blood pressure greater than 180  Hypernatremia > resolved P: Free water   Disposition / Summary of Today's Plan 09/13/18  Consider transfer to stepdown is been on trach collar for 48 hours Needs LTACH  Best Practice  Diet: TFs Pain/Anxiety/Delirium protocol (if indicated): RASS goal 0  VAP protocol (if indicated): Ordered DVT prophylaxis: SCDs  GI prophylaxis: Famotidine Hyperglycemia protocol: SSI, lantus Mobility: Bedrest Code Status: Full  Family: 09/13/2018 no family at bedside  . Richardson Landry Minor ACNP Maryanna Shape PCCM Pager [phone] till 1 pm If no answer page [phone] 09/13/2018, 10:11 AM

## 2018-09-13 NOTE — Progress Notes (Signed)
STROKE TEAM PROGRESS NOTE   SUBJECTIVE (INTERVAL HISTORY) No new events.  BP stable at 152/73  P73.      OBJECTIVE Vitals:   09/13/18 0333 09/13/18 0400 09/13/18 0500 09/13/18 0600  BP: (!) 171/91 (!) 166/90 (!) 153/85 (!) 173/85  Pulse: 81 82 61 78  Resp: 20 (!) 25 (!) 23 18  Temp:      TempSrc:      SpO2: 99% 97% 96% 95%  Weight:   134.6 kg   Height:        CBC:  Recent Labs  Lab 09/07/18 0459  09/12/18 0550 09/13/18 0504  WBC 12.1*   < > 11.3* 10.2  NEUTROABS 8.9*  --   --   --   HGB 6.8*   < > 9.0* 10.2*  HCT 24.0*   < > 31.6* 34.7*  MCV 86.0   < > 88.3 87.4  PLT 479*   < > 502* 543*   < > = values in this interval not displayed.    Basic Metabolic Panel:  Recent Labs  Lab 09/12/18 0550 09/13/18 0504  NA 143 139  K 4.3 4.5  CL 112* 107  CO2 26 24  GLUCOSE 95 96  BUN 29* 29*  CREATININE 0.73 0.68  CALCIUM 10.0 9.9    Lipid Panel:     Component Value Date/Time   CHOL 256 (H) 08/15/2018 0512   TRIG 248 (H) 08/15/2018 0512   HDL 35 (L) 08/15/2018 0512   CHOLHDL 7.3 08/15/2018 0512   VLDL 50 (H) 08/15/2018 0512   LDLCALC 171 (H) 08/15/2018 0512   HgbA1c:  Lab Results  Component Value Date   HGBA1C 11.4 (H) 08/15/2018   Urine Drug Screen:     Component Value Date/Time   LABOPIA NONE DETECTED 08/27/2018 1836   COCAINSCRNUR NONE DETECTED 08/11/2018 1836   LABBENZ NONE DETECTED 08/10/2018 1836   AMPHETMU NONE DETECTED 08/27/2018 1836   THCU NONE DETECTED 09/02/2018 1836   LABBARB NONE DETECTED 09/03/2018 1836    Alcohol Level No results found for: ETH  IMAGING  Ct Angio Head W Or Wo Contrast Ct Angio Neck W Or Wo Contrast 08/15/2018 IMPRESSION:  1. Interval basilar to left proximal PCA stenting. The density of the stent walls precludes detection of in stent stenosis; there is a degree of wasting at the mid basilar segment. There is flow in the left PCA beyond the stent such that there is presumed stent patency.  2. Known lower pontine  infarct. There are new small cerebellar infarcts since brain MRI yesterday.  3. Severe left P2 segment stenosis, also seen previously.  4. Moderate atheromatous narrowing in the proximal left MCA.    Ct Angio Head W Or Wo Contrast Ct Angio Neck W Or Wo Contrast 08/11/2018 IMPRESSION:  1. 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 No definite acute thrombus identified.  2. Severe stenosis left posterior cerebral artery and moderate stenosis right posterior cerebral artery.  3. Mild stenosis left MCA and moderate stenosis left MCA bifurcation.  4. No significant carotid or vertebral artery stenosis in the neck.    Ct Head Wo Contrast 08/23/2018 IMPRESSION:  No acute intracranial abnormalities. Mild white matter changes likely due to small vessel ischemia.    Mr Brain Wo Contrast 08/23/2018 IMPRESSION:  1. Acute nonhemorrhagic infarct involving the left paramedian brainstem. The infarct crosses midline.  2. Other periventricular and subcortical white matter disease is moderately advanced  for age. This likely reflects the sequela of chronic microvascular ischemia.  3. Tapering of the dens with prominent soft tissue pannus. 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    Ct Head Code Stroke Wo Contrast 08/13/2018  IMPRESSION:  1. No acute abnormality and no change from earlier today  2. ASPECTS is 10 3.    Cerebral Angiogram 08/25/2018 S/P 4 vessel cerebral arteriogram RT CFA approach. 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 S/P stent assisted angioplasty of distal basilar artery with patency of 80 to 90 %. 2.Approx 70 % stenosis of LT ICA supraclinoid seg   MRI 08/15/18 1. Progressive acute pontine infarct. New patchy bilateral cerebellar infarction. New tiny left thalamic infarcts. 2. Preserved flow void  in the stented basilar. 3. left facial/submandibular edema, please correlate with neck exam. Edited result: IMPRESSION: 1. Progressive acute pontine infarct. New patchy bilateral cerebellar infarction. New tiny left thalamic infarcts. 2. Preserved flow void in the stented basilar.  TTE - Left ventricle: The cavity size was normal. There was severe   concentric hypertrophy. Systolic function was normal. The   estimated ejection fraction was in the range of 60% to 65%. Wall   motion was normal; there were no regional wall motion   abnormalities. Doppler parameters are consistent with abnormal   left ventricular relaxation (grade 1 diastolic dysfunction). The   E/e&' ratio is <8, suggesting normal LV filling pressure. - Aortic valve: Trileaflet; mildly calcified leaflets.   Transvalvular velocity was minimally increased. There was no   regurgitation. Mean gradient (S): 12 mm Hg. - Mitral valve: Mildly thickened leaflets . There was trivial   regurgitation. - Left atrium: The atrium was normal in size. - Inferior vena cava: The vessel was dilated. The respirophasic   diameter changes were blunted (< 50%), consistent with elevated   central venous pressure. Impressions: - LVEF 60-65%, severe LVH, normal wall motion, grade 1 DD, normal   LV filling pressure, minimally increased aortic velocity without   signficant stenosis, trivial MR, normal LA size, dilated IVC.  CT abdomen and pelvis 09/03/18 Mild bilateral posterior basilar subsegmental atelectasis. Interval placement of new gastrostomy tube with tip and balloon within gastric lumen. Surgical drain is seen in the soft tissues in previous gastrostomy site. Small amount of gas and stranding is seen in the peritoneal fat anterior and inferior to the stomach which most likely is related to gastrostomy placement. Probable surgical wound seen in right upper quadrant of the anterior abdominal wall. 8.2 x 2.3 cm gas and fluid collection  is seen in the anterior abdominal wall which is unchanged compared to prior exam. Possible abscess cannot be excluded. Enlarged fibroid uterus.  PHYSICAL EXAM  Temp:  [97.8 F (36.6 C)-99.7 F (37.6 C)] 99.7 F (37.6 C) (11/09 0000) Pulse Rate:  [60-82] 78 (11/09 0600) Resp:  [18-30] 18 (11/09 0600) BP: (105-173)/(62-147) 173/85 (11/09 0600) SpO2:  [94 %-100 %] 95 % (11/09 0600) FiO2 (%):  [28 %] 28 % (11/09 0333) Weight:  [134.6 kg] 134.6 kg (11/09 0500)  General -  Obese middle-aged African-American lady, on trach collar, not in distress. She has abdominal surgical wound and drainage from the abdominal wall, dressing clean  Ophthalmologic - fundi not visualized due to noncooperation.  Cardiovascular - regular rate and rhythm  Neuro - post trach, on trach collar, eyes open, awake, alert. No ptosis or EOMI, denies diplopia.  PERRL.  Able to wiggle left hand fingers and left foot toes only.  Follows commands and good eye contact.    ASSESSMENT/PLAN Andrea [name] is a 57 y.o. female with history of difficult to control hypertension, insulin-dependent diabetes, obesity, hypothyroidism status post ablation presenting with chest discomfort, right-sided weakness, slurred speech and right facial droop. She did not receive IV t-PA due to late presentation. S/P stent assisted angioplasty of distal basilar artery.  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 Worsening symptoms with extension of pontine/medullary infarcts with new small bilateral cerebellar infarcts without evidence of stent re-stenosis or occlusion  Resultant quadriplegia, on trach and peg  CT head - No acute intracranial abnormalities.  CTA H&N 08/27/2018 - Extensive atherosclerotic disease in the basilar with focal critical stenosis in the mid to distal basilar. No definite acute thrombus identified. Severe stenosis left posterior cerebral artery  MRI head - Acute  nonhemorrhagic infarct involving the left paramedian brainstem.   CTA H&N 08/15/2018 - stent too dense to see BA lumen, but distal flow preserved. New small cerebellar infarcts since brain MRI yesterday.  Severe left P2 segment stenosis, also seen previously.   Repeat MRI - extension of b/l pontine and upper medullary infarcts with new b/l cerebellar infarcts.  2D Echo - EF 60-65%  LDL - 174  HgbA1c - 11.1  VTE prophylaxis - heparin subq  aspirin 81 mg daily prior to admission, now resumed on Brilinta 90 mg bid and ASA 81 mg daily.   Patient counseled to be compliant with her antithrombotic medications  Ongoing aggressive stroke risk factor management  Therapy recommendations:  LTACH  Disposition:  Pending - will wait once pt more improved with speaking valve, need to discuss about further plan with her   Intracranial stenosis  BA mid to distal severe stenosis s/p BA stenting  Left PCA severe stenosis, R PCA moderate stenosis  Left MCA moderate stenosis  Uncontrolled stroke risk factors with HLD, HTN, DM  Non compliance with meds at home  Respiratory failure  S/p trach 08/12/2018  On trach collar now  Working with speech therapist for speaking valve  CCM on board  Hypertension  Stable but fluctuate with position   BP goal 130-150   On po norvasc, Coreg 25 bid, and hydralazine 25mg  tid  Hyperlipidemia  Lipid lowering medication PTA:  Lipitor 20 mg daily  LDL 174, goal < 70  Increased to 80 mg daily  Continue statin at discharge  Diabetes  HgbA1c 11.4, goal < 7.0  Uncontrolled at home  Glucose stable on insulin   Decrease Lantus again today to 18U bid   Decrease basal NovoLog again today to 4U Q4h  SSI  CBG monitoring  Low grade fever with leukocytosis  Tmax 101.6 -> afebrile  Leukocytosis - 14.0->12.1->9.9->11.2->11.5->11.3  UA WBC 21-50  CXR unremarkable, improving from prior  On meropenem now  Blood cultures negative 10/26  ;  Respiratory cultures - mixed flora   Abdominal abscess culture showed proteus mirabilis  CCM and trauma on board  Hypernatremia and elevated Cre/BUN  Na 149->148->150->147->144->143  Cre 1.31->1.09->0.86->0.80->0.73->0.73  Continue 1/2NS @ 50  On TF @ 45  On free water 300mg  Q4h  Continue BMP monitoring  Dysphagia s/p PEG complicated by abdominal wall abscess  Continue tube feeding @ 45 cc/h  PEG done 08/22/2018  Removed and replaced dislodged PEG 08/18/2018  CT showed Abdominal wall abscess - s/p open surgical drainage 08/25/2018 persistent foul  odor discharge. Reexploration 10/02/2018  Culture showed peroteus mirabilis  On meropenum now   Trauma surgery following, appreciate recs  Anemia   Hb 6.8->PRBC->7.1->6.8->8.2->8.4->8.9->8.6->9.0  Could be due to iron deficiency and acute uterine bleeding and recent surgeries  Iron panel showed iron deficiency  Put on iron pills  PRBC transfusion 1 U 09/12/2018 and 2U 09/07/18  CBC monitoring  Abnormal uterine bleeding, improved  Has been following with GYN  Was on megace in the past  CT abd/pelvis 09/03/18 - enlarged fibroid uterus  resumed megace 80mg  bid  Continue ASA and brilinta for now  Close CBC monitoring  Other Stroke Risk Factors  Obesity, Body mass index is 50.94 kg/m., recommend weight loss, diet and exercise as appropriate   Other Active Problems  Hypokalemia - on supplement, now resolved  Thrombocytosis - Plts - 489 -> 553-> 535->479->492->497->484->502 likely due to anemia  Hospital day # 30  This patient is critically ill due to worsening brainstem infarct, BA stenosis s/p stenting, hyperglycemia, hypertensive, fever, anemia, abdominal wall abscess s/p drainage, dislodged PEG and hypernatremiaand at significant risk of neurological worsening, death form recurrent stroke, hemorrhagic conversion, CHF, seizure, aspiration, hemorrhagic shock. This patient's care requires constant monitoring  of vital signs, hemodynamics, respiratory and cardiac monitoring, review of multiple databases, neurological assessment, discussion with family, other specialists and medical decision making of high complexity. I spent 30 minutes of neurocritical care time in the care of this patient.    Acute pontine and bilateral cerebellar ischemic infarcts secondary to Basilar artery and bilateral PCA stenosis s/p Basilar artery stenting.  Continue dual antiplatelet therapy with risk factor modification.  Exam shows essentially quadriplegia.  Will need extensive inpatient rehab at some point.  Continue to follow with you.    Rogue Jury, MS, MD  To contact Stroke Continuity provider, please refer to [email or web address] After hours, contact General Neurology

## 2018-09-14 LAB — GLUCOSE, CAPILLARY
GLUCOSE-CAPILLARY: 134 mg/dL — AB (ref 70–99)
GLUCOSE-CAPILLARY: 104 mg/dL — AB (ref 70–99)
GLUCOSE-CAPILLARY: 139 mg/dL — AB (ref 70–99)
GLUCOSE-CAPILLARY: 105 mg/dL — AB (ref 70–99)
GLUCOSE-CAPILLARY: 122 mg/dL — AB (ref 70–99)
GLUCOSE-CAPILLARY: 115 mg/dL — AB (ref 70–99)
Glucose-Capillary: 99 mg/dL (ref 70–99)

## 2018-09-14 NOTE — Progress Notes (Signed)
Appreciate care of Neurology, PCCM and surgery for this patient.   FM continues to follow and will be happy to resume care once clinical status improves.   Andrea Attikus Bartoszek, DO PGY-2, [facility]

## 2018-09-14 NOTE — Progress Notes (Signed)
STROKE TEAM PROGRESS NOTE   SUBJECTIVE (INTERVAL HISTORY) No new events.  BP stable at 154/73  P77.      OBJECTIVE Vitals:   09/14/18 0400 09/14/18 0500 09/14/18 0600 09/14/18 0700  BP: (!) 182/87 (!) 155/89 (!) 156/92 136/79  Pulse: 79 83 73 72  Resp: 19 (!) 22 (!) 22 (!) 23  Temp:      TempSrc:      SpO2: 97% 96% 97% 96%  Weight:  131.4 kg    Height:        CBC:  Recent Labs  Lab 09/12/18 0550 09/13/18 0504  WBC 11.3* 10.2  HGB 9.0* 10.2*  HCT 31.6* 34.7*  MCV 88.3 87.4  PLT 502* 543*    Basic Metabolic Panel:  Recent Labs  Lab 09/12/18 0550 09/13/18 0504  NA 143 139  K 4.3 4.5  CL 112* 107  CO2 26 24  GLUCOSE 95 96  BUN 29* 29*  CREATININE 0.73 0.68  CALCIUM 10.0 9.9    Lipid Panel:     Component Value Date/Time   CHOL 256 (H) 08/15/2018 0512   TRIG 248 (H) 08/15/2018 0512   HDL 35 (L) 08/15/2018 0512   CHOLHDL 7.3 08/15/2018 0512   VLDL 50 (H) 08/15/2018 0512   LDLCALC 171 (H) 08/15/2018 0512   HgbA1c:  Lab Results  Component Value Date   HGBA1C 11.4 (H) 08/15/2018   Urine Drug Screen:     Component Value Date/Time   LABOPIA NONE DETECTED 08/13/2018 1836   COCAINSCRNUR NONE DETECTED 08/27/2018 1836   LABBENZ NONE DETECTED 08/24/2018 1836   AMPHETMU NONE DETECTED 08/29/2018 1836   THCU NONE DETECTED 08/08/2018 1836   LABBARB NONE DETECTED 08/13/2018 1836    Alcohol Level No results found for: ETH  IMAGING  Ct Angio Head W Or Wo Contrast Ct Angio Neck W Or Wo Contrast 08/15/2018 IMPRESSION:  1. Interval basilar to left proximal PCA stenting. The density of the stent walls precludes detection of in stent stenosis; there is a degree of wasting at the mid basilar segment. There is flow in the left PCA beyond the stent such that there is presumed stent patency.  2. Known lower pontine infarct. There are new small cerebellar infarcts since brain MRI yesterday.  3. Severe left P2 segment stenosis, also seen previously.  4. Moderate  atheromatous narrowing in the proximal left MCA.    Ct Angio Head W Or Wo Contrast Ct Angio Neck W Or Wo Contrast 08/31/2018 IMPRESSION:  1. 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 No definite acute thrombus identified.  2. Severe stenosis left posterior cerebral artery and moderate stenosis right posterior cerebral artery.  3. Mild stenosis left MCA and moderate stenosis left MCA bifurcation.  4. No significant carotid or vertebral artery stenosis in the neck.    Ct Head Wo Contrast 09/01/2018 IMPRESSION:  No acute intracranial abnormalities. Mild white matter changes likely due to small vessel ischemia.    Mr Brain Wo Contrast 08/20/2018 IMPRESSION:  1. Acute nonhemorrhagic infarct involving the left paramedian brainstem. The infarct crosses midline.  2. Other periventricular and subcortical white matter disease is moderately advanced for age. This likely reflects the sequela of chronic microvascular ischemia.  3. Tapering of the dens with prominent soft tissue pannus. 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    Ct Head Code Stroke Wo Contrast 08/07/2018  IMPRESSION:  1. No acute abnormality and no change from earlier today  2. ASPECTS is 10 3.    Cerebral Angiogram 08/16/2018 S/P 4 vessel cerebral arteriogram RT CFA approach. 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 S/P stent assisted angioplasty of distal basilar artery with patency of 80 to 90 %. 2.Approx 70 % stenosis of LT ICA supraclinoid seg   MRI 08/15/18 1. Progressive acute pontine infarct. New patchy bilateral cerebellar infarction. New tiny left thalamic infarcts. 2. Preserved flow void in the stented basilar. 3. left facial/submandibular edema, please correlate with neck exam. Edited result: IMPRESSION: 1. Progressive acute  pontine infarct. New patchy bilateral cerebellar infarction. New tiny left thalamic infarcts. 2. Preserved flow void in the stented basilar.  TTE - Left ventricle: The cavity size was normal. There was severe   concentric hypertrophy. Systolic function was normal. The   estimated ejection fraction was in the range of 60% to 65%. Wall   motion was normal; there were no regional wall motion   abnormalities. Doppler parameters are consistent with abnormal   left ventricular relaxation (grade 1 diastolic dysfunction). The   E/e&' ratio is <8, suggesting normal LV filling pressure. - Aortic valve: Trileaflet; mildly calcified leaflets.   Transvalvular velocity was minimally increased. There was no   regurgitation. Mean gradient (S): 12 mm Hg. - Mitral valve: Mildly thickened leaflets . There was trivial   regurgitation. - Left atrium: The atrium was normal in size. - Inferior vena cava: The vessel was dilated. The respirophasic   diameter changes were blunted (< 50%), consistent with elevated   central venous pressure. Impressions: - LVEF 60-65%, severe LVH, normal wall motion, grade 1 DD, normal   LV filling pressure, minimally increased aortic velocity without   signficant stenosis, trivial MR, normal LA size, dilated IVC.  CT abdomen and pelvis 09/03/18 Mild bilateral posterior basilar subsegmental atelectasis. Interval placement of new gastrostomy tube with tip and balloon within gastric lumen. Surgical drain is seen in the soft tissues in previous gastrostomy site. Small amount of gas and stranding is seen in the peritoneal fat anterior and inferior to the stomach which most likely is related to gastrostomy placement. Probable surgical wound seen in right upper quadrant of the anterior abdominal wall. 8.2 x 2.3 cm gas and fluid collection is seen in the anterior abdominal wall which is unchanged compared to prior exam. Possible abscess cannot be excluded. Enlarged fibroid  uterus.  PHYSICAL EXAM  Temp:  [98.1 F (36.7 C)-99.2 F (37.3 C)] 99.2 F (37.3 C) (11/10 0300) Pulse Rate:  [62-87] 72 (11/10 0700) Resp:  [12-26] 23 (11/10 0700) BP: (127-182)/(69-92) 136/79 (11/10 0700) SpO2:  [94 %-99 %] 96 % (11/10 0700) FiO2 (%):  [28 %] 28 % (11/10 0500) Weight:  [131.4 kg] 131.4 kg (11/10 0500)  General -  Obese middle-aged African-American lady, on trach collar, not in distress. She has abdominal surgical wound and drainage from the abdominal wall, dressing clean  Ophthalmologic - fundi not visualized due to noncooperation.  Cardiovascular - regular rate and rhythm  Neuro - post trach, on trach collar, eyes open, awake, alert. No ptosis or EOMI, denies diplopia.  PERRL.  Able to wiggle left hand fingers and left foot toes only.  Follows commands and good eye contact.  Speech is mildly dysarthric.  Oriented.  ASSESSMENT/PLAN Andrea [name] is a 57 y.o. female with history of difficult to control hypertension, insulin-dependent diabetes, obesity, hypothyroidism status post  ablation presenting with chest discomfort, right-sided weakness, slurred speech and right facial droop. She did not receive IV t-PA due to late presentation. S/P stent assisted angioplasty of distal basilar artery.  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 Worsening symptoms with extension of pontine/medullary infarcts with new small bilateral cerebellar infarcts without evidence of stent re-stenosis or occlusion  Resultant quadriplegia, on trach and peg  CT head - No acute intracranial abnormalities.  CTA H&N 08/11/2018 - Extensive atherosclerotic disease in the basilar with focal critical stenosis in the mid to distal basilar. No definite acute thrombus identified. Severe stenosis left posterior cerebral artery  MRI head - Acute nonhemorrhagic infarct involving the left paramedian brainstem.   CTA H&N 08/15/2018 - stent too dense to  see BA lumen, but distal flow preserved. New small cerebellar infarcts since brain MRI yesterday.  Severe left P2 segment stenosis, also seen previously.   Repeat MRI - extension of b/l pontine and upper medullary infarcts with new b/l cerebellar infarcts.  2D Echo - EF 60-65%  LDL - 174  HgbA1c - 11.1  VTE prophylaxis - heparin subq  aspirin 81 mg daily prior to admission, now resumed on Brilinta 90 mg bid and ASA 81 mg daily.   Patient counseled to be compliant with her antithrombotic medications  Ongoing aggressive stroke risk factor management  Therapy recommendations:  LTACH  Disposition:  Pending - will wait once pt more improved with speaking valve, need to discuss about further plan with her   Intracranial stenosis  BA mid to distal severe stenosis s/p BA stenting  Left PCA severe stenosis, R PCA moderate stenosis  Left MCA moderate stenosis  Uncontrolled stroke risk factors with HLD, HTN, DM  Non compliance with meds at home  Respiratory failure  S/p trach 08/16/2018  On trach collar now  Working with speech therapist for speaking valve  CCM on board  Hypertension  Stable but fluctuate with position   BP goal 130-150   On po norvasc, Coreg 25 bid, and hydralazine 25mg  tid  Hyperlipidemia  Lipid lowering medication PTA:  Lipitor 20 mg daily  LDL 174, goal < 70  Increased to 80 mg daily  Continue statin at discharge  Diabetes  HgbA1c 11.4, goal < 7.0  Uncontrolled at home  Glucose stable on insulin   Decrease Lantus again today to 18U bid   Decrease basal NovoLog again today to 4U Q4h  SSI  CBG monitoring  Low grade fever with leukocytosis  Tmax 101.6 -> afebrile  Leukocytosis - 14.0->12.1->9.9->11.2->11.5->11.3  UA WBC 21-50  CXR unremarkable, improving from prior  On meropenem now  Blood cultures negative 10/26 ;  Respiratory cultures - mixed flora   Abdominal abscess culture showed proteus mirabilis  CCM and  trauma on board  Hypernatremia and elevated Cre/BUN  Na 149->148->150->147->144->143  Cre 1.31->1.09->0.86->0.80->0.73->0.73  Continue 1/2NS @ 50  On TF @ 45  On free water 300mg  Q4h  Continue BMP monitoring  Dysphagia s/p PEG complicated by abdominal wall abscess  Continue tube feeding @ 45 cc/h  PEG done 09/04/2018  Removed and replaced dislodged PEG 09/02/2018  CT showed Abdominal wall abscess - s/p open surgical drainage 08/06/2018 persistent foul odor discharge. Reexploration 09/20/2018  Culture showed peroteus mirabilis  On meropenum now   Trauma surgery following, appreciate recs  Anemia   Hb 6.8->PRBC->7.1->6.8->8.2->8.4->8.9->8.6->9.0  Could be due to iron deficiency and acute uterine bleeding and recent surgeries  Iron panel showed iron deficiency  Put on iron pills  PRBC transfusion 1 U 09/07/2018 and 2U 09/07/18  CBC monitoring  Abnormal uterine bleeding, improved  Has been following with GYN  Was on megace in the past  CT abd/pelvis 09/03/18 - enlarged fibroid uterus  resumed megace 80mg  bid  Continue ASA and brilinta for now  Close CBC monitoring  Other Stroke Risk Factors  Obesity, Body mass index is 49.72 kg/m., recommend weight loss, diet and exercise as appropriate   Other Active Problems  Hypokalemia - on supplement, now resolved  Thrombocytosis - Plts - 489 -> 553-> 535->479->492->497->484->502 likely due to anemia  Hospital day # 31  This patient is critically ill due to worsening brainstem infarct, BA stenosis s/p stenting, hyperglycemia, hypertensive, fever, anemia, abdominal wall abscess s/p drainage, dislodged PEG and hypernatremiaand at significant risk of neurological worsening, death form recurrent stroke, hemorrhagic conversion, CHF, seizure, aspiration, hemorrhagic shock. This patient's care requires constant monitoring of vital signs, hemodynamics, respiratory and cardiac monitoring, review of multiple databases,  neurological assessment, discussion with family, other specialists and medical decision making of high complexity. I spent 30 minutes of neurocritical care time in the care of this patient.    Acute pontine and bilateral cerebellar ischemic infarcts secondary to Basilar artery and bilateral PCA stenosis s/p Basilar artery stenting.  Continue dual antiplatelet therapy with risk factor modification.  Exam shows essentially quadriplegia.  Will need extensive inpatient rehab at some point.     Andrea Jury, MS, MD    To contact Stroke Continuity provider, please refer to [email or web address] After hours, contact General Neurology

## 2018-09-15 LAB — GLUCOSE, CAPILLARY
GLUCOSE-CAPILLARY: 123 mg/dL — AB (ref 70–99)
GLUCOSE-CAPILLARY: 88 mg/dL (ref 70–99)
GLUCOSE-CAPILLARY: 99 mg/dL (ref 70–99)
Glucose-Capillary: 113 mg/dL — ABNORMAL HIGH (ref 70–99)
Glucose-Capillary: 136 mg/dL — ABNORMAL HIGH (ref 70–99)
Glucose-Capillary: 150 mg/dL — ABNORMAL HIGH (ref 70–99)

## 2018-09-15 MED ORDER — GABAPENTIN 250 MG/5ML PO SOLN
100.0000 mg | Freq: Two times a day (BID) | ORAL | Status: DC
  Administered 2018-09-15 – 2018-09-29 (×28): 100 mg
  Filled 2018-09-15 (×39): qty 2

## 2018-09-15 NOTE — Progress Notes (Signed)
NAME:  Andrea [name], MRN:  [id number], DOB:  August 21, 1961, LOS: 59 ADMISSION DATE:  08/17/2018, CONSULTATION DATE:  08/08/2018 REFERRING MD:  Dr. Rory Percy, CHIEF COMPLAINT:  CP/ Acute CVA  Brief History   69 yoF w/poorly controlled HTN and IDDM presenting with CP and left arm pain w/BP 220/117. Initial CTH neg.  Cardiac workup negative for acute event thus far, cardiology following.  Code stroke initiated for R hemiplegia, dysarthria and right facial droop.  Out of window for TPA.  Requiring cleviprex for BP control.  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  Evolving pontine stroke on MRI. Intubated 10/10 for neuro IR s/p stenting and angioplasty with 80-90% patency.  Extubated post procedure but due to insufficency placed on BiPAP and brought to ICU.  Required reintubation and ultimately trach (10/21 JY).  Peg tube placed 10/21 by CCS.  Taken to OR 10/28 for open gastrostomy and I&D of abdominal wall abscess. 11/1 taken back to OR for repeat ex-lap.     Past Medical History  HTN, IDDM, hypothyroidism s/p ablation  Significant Hospital Events   10/9 present to [facility] ED -transferred to [facility] 10/9 basilar artery stenting 10/9 intubated urgently 6h post procedure for inability to protect airway. 10/10 CTA for declining exam - evolving pontine infarct, stent deemed to be patent 10/14 Brillinta stopped and ASA/ heparin started to in anticipation of tracheostomy on Monday. 10/14 through 10/19: Neuro exam has been stable sodium increasing in spite of free water via tube, providing supportive care awaiting trach 10/20: Sodium down to 151 from 156 clinically looks the same 10/21: Sodium down to 148, sputum culture sent for purulent tracheal secretions, awaiting trach and PEG.  Purulent bronchial secretions from right upper lobe sent for BAL 10/22: Tracheostomy and PEG completed on the 21st tolerated well.  Spiking low-grade fever white cells continued to climb starting empiric  vancomycin and ceftaz.  Sodium improved down to 146.  Blood pressure improving but still not at goal added hydrochlorothiazide.  Resumed Brilinta.  10/23: Sodium normal.  Backing down on free water.  Glycemic control challenging, increased basal NovoLog in addition to Lantus and sliding scale.  Still spiking fever, white blood cell count climbing, sending blood cultures.  Foley catheter being placed for pressure ulcer caused by pure wick device 10/27 Foul smelling dark trach secretions, PEG secretions  10/28 to OR for ex lap due to PEG dislodgement.  PEG replaced 10/29 remains on MV 2/2 tachypnea; family meeting 10/30 no weaning 2/2 to ongoing tachypnea; foul smelling drainage from abd site 10/31 no weaning, abx changed, family meeting 11/1 repeat Ex-lap for I&D of absces 11/1 tolerated > 48h trach collar trial  Consults: date of consult/date signed off & final recs:  Cards 10/10 Neurology 10/10 PCCM 10/10 CCS 10/17  Procedures (surgical and bedside):  10/10 Neuro IR >> s/p stent and angioplasty of distal basilar artery  10/10 ETT for procedure 10/10 ETT (reintubated) >> removed 10/21: Size 6 tracheostomy placed by Dr. Nelda Marseille 10/26 changed to #4 trach cuffless  10/28 Ex lap with replacement of gastrostomy tube due to dislodgement 11/1 repeat Ex-lap with I&D of abdominal wall abscess   Significant Diagnostic Tests:  10/10  CTH >>  No acute intracranial abnormalities. Mild white matter changes likely due to small vessel ischemia 10/10 CTH >> No acute abnormality and no change from earlier today 10/10 CTA head and neck >> Extensive atherosclerotic disease in the basilar with focal critical stenosis in the mid  to distal basilar. No definite acute thrombus identified. Severe stenosis left posterior cerebral artery and moderate stenosis right posterior cerebral artery. Mild stenosis left MCA and moderate stenosis left MCA bifurcation. No significant carotid or vertebral artery stenosis in the  neck. 10/10 MRI >> evolving pontine infarct. 10/11 ECHO >> normal LVSF with severe LVH. 10/28 CT A / P > dislodged PEG tube outside of the peritoneum and not within the stomach.  Cholelithiasis. No bowel obstruction.  No hydronephrosis.  10/30 CT abd/pelvis >> Mild bilateral atelectasis.  Interval placement of new gastrostomy tube with tip and balloon within gastric lumen. Surgical drain is seen in the soft tissues in previous gastrostomy site. Small amount of gas and stranding is seen in the peritoneal fat anterior and inferior to the stomach which most likely is related to gastrostomy placement. Probable surgical wound seen in right upper quadrant of the anterior abdominal wall.  8.2 x 2.3 cm gas and fluid collection is seen in the anterior abdominal wall which is unchanged compared to prior exam. Possible abscess cannot be excluded.  Enlarged fibroid uterus.  Micro Data:  10/10 MRSA PCR >> negative 10/21 Sputum culture >> nl resp flora  10/23 Blood cultures x 2 >> negative 10/26 Blood cultures x 2 >> neg 10/28 MRSA PCR >> neg 10/27 Sputum culture >> normal flora 11/1 Abdominal abscess> kelbsiella, proteus, susceptible to imipenem, amp-sulbactam, ESBL  Antimicrobials:  10/10 cefazolin preop 10/22 Vancomycin >> 10/30 10/22 Ceftaz >> 10/31 10/31 ceftriaxone >> 11/4 10/31 flagyl >> 11/4 11/4 meropenem >>   Subjective:  Denies dyspnea on trach collar.  Complains of pain on back side and pins and needles in both legs.  Denies abdominal pain.  Objective   Blood pressure 130/75, pulse 70, temperature 98.2 F (36.8 C), temperature source Oral, resp. rate (!) 24, height _0  (1.626 m), weight 130.4 kg, last menstrual period 07/26/2018, SpO2 96 %.    FiO2 (%):  [28 %] 28 %   Intake/Output Summary (Last 24 hours) at 09/15/2018 0909 Last data filed at 09/15/2018 0500 Gross per 24 hour  Intake 1235 ml  Output 3400 ml  Net -2165 ml   Filed Weights   09/13/18 0500 09/14/18 0500  09/15/18 0500  Weight: 134.6 kg 131.4 kg 130.4 kg   Examination:   General: Obese female currently on trach collar for greater than 48 hours HEENT: Trach was unremarkable, effectively expectorating secretions Neuro: Awake and following commands, speech is slow but comprehensible.  2/5 strength on left side, remains 0/5 on right. Sensation grossly normal bilaterally. CV: Normal HS. Extremities are warm. PULM: tachypnea with pain related agitation. Vesicular breath sounds throughout.  HD:QQIW, non-tender, with PEG tube in place, sight covered with clean dressing. Extremities:  2+ edema throughout Skin: no rashes or lesions  Assessment & Plan:   Acute respiratory failure/ hypoxemia due to inability to protect airway from cva Tolerating extended period on trach collar.  P: Ready to transfer to SDU 4NP Will change tracheostomy to uncuffed trach.  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 P: Trial of gabapentin.  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/p stent placement with extension of infarcts into the pontine and medullary spaces and small bilateral cerebellar infarcts   -Resulting in inadequate airway protection and quadriplegia P: Antiplatelets per neurology Continue physical therapy Continue SLP rehabilitation.  Abd wall abscess with polymicrobial drainage - s/p I&D 10/29 by CCS P: Continue current wound care management.  Hypertension P: Continue antihypertensive regimen  PRN antihypertensives for systolic blood pressure greater than 180   Disposition / Summary of Today's Plan 09/15/18   Ready for transfer to stepdown as patient has been on trach collar for 48 hours Ready for  LTACH placement once no further abdominal wound surgery required.  Best Practice  Diet: TFs Pain/Anxiety/Delirium protocol (if indicated): PRN medication only. VAP protocol (if indicated): Ordered DVT prophylaxis: SCDs on DAPT GI prophylaxis:  Famotidine Hyperglycemia protocol: SSI, lantus Mobility: Ad lib with rehab. Code Status: Full  Family: 09/15/2018 no family at bedside  Kipp Brood, MD [facility] ICU Physician [place]  Pager: [phone] Mobile: [phone] After hours: [phone].  09/15/2018, 9:09 AM

## 2018-09-15 NOTE — Progress Notes (Signed)
STROKE TEAM PROGRESS NOTE   SUBJECTIVE (INTERVAL HISTORY) No new events.  She is on trach collar but appears to be in mild respiratory distress. Neurologically unchanged awake following commands and moving left side purposefully. Stable dense right hemiplegia. Discussed with Dr. Lynetta Mare  plan would be to transfer to neurology stepdown unit and likely to [facility] later.      OBJECTIVE Vitals:   09/15/18 1328 09/15/18 1332 09/15/18 1605 09/15/18 1700  BP:   133/73 133/73  Pulse: 80 79 69 70  Resp: (!) 22 (!) 26 (!) 22 17  Temp:   98.5 F (36.9 C)   TempSrc:   Oral   SpO2: 97% 97%  99%  Weight:      Height:        CBC:  Recent Labs  Lab 09/12/18 0550 09/13/18 0504  WBC 11.3* 10.2  HGB 9.0* 10.2*  HCT 31.6* 34.7*  MCV 88.3 87.4  PLT 502* 543*    Basic Metabolic Panel:  Recent Labs  Lab 09/12/18 0550 09/13/18 0504  NA 143 139  K 4.3 4.5  CL 112* 107  CO2 26 24  GLUCOSE 95 96  BUN 29* 29*  CREATININE 0.73 0.68  CALCIUM 10.0 9.9    Lipid Panel:     Component Value Date/Time   CHOL 256 (H) 08/15/2018 0512   TRIG 248 (H) 08/15/2018 0512   HDL 35 (L) 08/15/2018 0512   CHOLHDL 7.3 08/15/2018 0512   VLDL 50 (H) 08/15/2018 0512   LDLCALC 171 (H) 08/15/2018 0512   HgbA1c:  Lab Results  Component Value Date   HGBA1C 11.4 (H) 08/15/2018   Urine Drug Screen:     Component Value Date/Time   LABOPIA NONE DETECTED 08/22/2018 1836   COCAINSCRNUR NONE DETECTED 08/23/2018 1836   LABBENZ NONE DETECTED 08/06/2018 1836   AMPHETMU NONE DETECTED 08/29/2018 1836   THCU NONE DETECTED 08/11/2018 1836   LABBARB NONE DETECTED 08/31/2018 1836    Alcohol Level No results found for: ETH  IMAGING  Ct Angio Head W Or Wo Contrast Ct Angio Neck W Or Wo Contrast 08/15/2018 IMPRESSION:  1. Interval basilar to left proximal PCA stenting. The density of the stent walls precludes detection of in stent stenosis; there is a degree of wasting at the mid basilar segment. There is flow in  the left PCA beyond the stent such that there is presumed stent patency.  2. Known lower pontine infarct. There are new small cerebellar infarcts since brain MRI yesterday.  3. Severe left P2 segment stenosis, also seen previously.  4. Moderate atheromatous narrowing in the proximal left MCA.    Ct Angio Head W Or Wo Contrast Ct Angio Neck W Or Wo Contrast 08/05/2018 IMPRESSION:  1. 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 No definite acute thrombus identified.  2. Severe stenosis left posterior cerebral artery and moderate stenosis right posterior cerebral artery.  3. Mild stenosis left MCA and moderate stenosis left MCA bifurcation.  4. No significant carotid or vertebral artery stenosis in the neck.    Ct Head Wo Contrast 08/08/2018 IMPRESSION:  No acute intracranial abnormalities. Mild white matter changes likely due to small vessel ischemia.    Mr Brain Wo Contrast 08/18/2018 IMPRESSION:  1. Acute nonhemorrhagic infarct involving the left paramedian brainstem. The infarct crosses midline.  2. Other periventricular and subcortical white matter disease is moderately advanced for age. This likely reflects the sequela of chronic microvascular ischemia.  3. Tapering of the dens with prominent soft tissue pannus. 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    Ct Head Code Stroke Wo Contrast 08/22/2018  IMPRESSION:  1. No acute abnormality and no change from earlier today  2. ASPECTS is 10 3.    Cerebral Angiogram 08/13/2018 S/P 4 vessel cerebral arteriogram RT CFA approach. 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 S/P stent assisted angioplasty of distal basilar artery with patency of 80 to 90 %. 2.Approx 70 % stenosis of LT ICA supraclinoid seg   MRI 08/15/18 1. Progressive acute pontine infarct. New  patchy bilateral cerebellar infarction. New tiny left thalamic infarcts. 2. Preserved flow void in the stented basilar. 3. left facial/submandibular edema, please correlate with neck exam. Edited result: IMPRESSION: 1. Progressive acute pontine infarct. New patchy bilateral cerebellar infarction. New tiny left thalamic infarcts. 2. Preserved flow void in the stented basilar.  TTE - Left ventricle: The cavity size was normal. There was severe   concentric hypertrophy. Systolic function was normal. The   estimated ejection fraction was in the range of 60% to 65%. Wall   motion was normal; there were no regional wall motion   abnormalities. Doppler parameters are consistent with abnormal   left ventricular relaxation (grade 1 diastolic dysfunction). The   E/e&' ratio is <8, suggesting normal LV filling pressure. - Aortic valve: Trileaflet; mildly calcified leaflets.   Transvalvular velocity was minimally increased. There was no   regurgitation. Mean gradient (S): 12 mm Hg. - Mitral valve: Mildly thickened leaflets . There was trivial   regurgitation. - Left atrium: The atrium was normal in size. - Inferior vena cava: The vessel was dilated. The respirophasic   diameter changes were blunted (< 50%), consistent with elevated   central venous pressure. Impressions: - LVEF 60-65%, severe LVH, normal wall motion, grade 1 DD, normal   LV filling pressure, minimally increased aortic velocity without   signficant stenosis, trivial MR, normal LA size, dilated IVC.  CT abdomen and pelvis 09/03/18 Mild bilateral posterior basilar subsegmental atelectasis. Interval placement of new gastrostomy tube with tip and balloon within gastric lumen. Surgical drain is seen in the soft tissues in previous gastrostomy site. Small amount of gas and stranding is seen in the peritoneal fat anterior and inferior to the stomach which most likely is related to gastrostomy placement. Probable surgical wound  seen in right upper quadrant of the anterior abdominal wall. 8.2 x 2.3 cm gas and fluid collection is seen in the anterior abdominal wall which is unchanged compared to prior exam. Possible abscess cannot be excluded. Enlarged fibroid uterus.  PHYSICAL EXAM  Temp:  [97.4 F (36.3 C)-99.4 F (37.4 C)] 98.5 F (36.9 C) (11/11 1605) Pulse Rate:  [61-80] 70 (11/11 1700) Resp:  [16-28] 17 (11/11 1700) BP: (129-166)/(69-91) 133/73 (11/11 1700) SpO2:  [94 %-99 %] 99 % (11/11 1700) FiO2 (%):  [28 %] 28 % (11/11 1328) Weight:  [130.4 kg] 130.4 kg (11/11 0500)  General -  Obese middle-aged African-American lady, on trach collar, not in distress. She has abdominal surgical wound and drainage from the abdominal wall, dressing clean  Ophthalmologic - fundi not visualized due to noncooperation.  Cardiovascular - regular rate and rhythm  Neuro - post trach, on trach collar, eyes open, awake, alert. No ptosis or EOMI, denies diplopia.  PERRL.  Able to wiggle left hand fingers and left foot toes  only.  Follows commands and good eye contact.  Speech is mildly dysarthric.   Dense right hemiplegia persists.  ASSESSMENT/PLAN Andrea [name] is a 57 y.o. female with history of difficult to control hypertension, insulin-dependent diabetes, obesity, hypothyroidism status post ablation presenting with chest discomfort, right-sided weakness, slurred speech and right facial droop. She did not receive IV t-PA due to late presentation. S/P stent assisted angioplasty of distal basilar artery.  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 Worsening symptoms with extension of pontine/medullary infarcts with new small bilateral cerebellar infarcts without evidence of stent re-stenosis or occlusion  Resultant quadriplegia, on trach and peg  CT head - No acute intracranial abnormalities.  CTA H&N 08/27/2018 - Extensive atherosclerotic disease in the basilar with focal  critical stenosis in the mid to distal basilar. No definite acute thrombus identified. Severe stenosis left posterior cerebral artery  MRI head - Acute nonhemorrhagic infarct involving the left paramedian brainstem.   CTA H&N 08/15/2018 - stent too dense to see BA lumen, but distal flow preserved. New small cerebellar infarcts since brain MRI yesterday.  Severe left P2 segment stenosis, also seen previously.   Repeat MRI - extension of b/l pontine and upper medullary infarcts with new b/l cerebellar infarcts.  2D Echo - EF 60-65%  LDL - 174  HgbA1c - 11.1  VTE prophylaxis - heparin subq  aspirin 81 mg daily prior to admission, now resumed on Brilinta 90 mg bid and ASA 81 mg daily.   Patient counseled to be compliant with her antithrombotic medications  Ongoing aggressive stroke risk factor management  Therapy recommendations:  LTACH  Disposition:  Pending - will wait once pt more improved with speaking valve, need to discuss about further plan with her   Intracranial stenosis  BA mid to distal severe stenosis s/p BA stenting  Left PCA severe stenosis, R PCA moderate stenosis  Left MCA moderate stenosis  Uncontrolled stroke risk factors with HLD, HTN, DM  Non compliance with meds at home  Respiratory failure  S/p trach 09/04/2018  On trach collar now  Working with speech therapist for speaking valve  CCM on board  Hypertension  Stable but fluctuate with position   BP goal 130-150   On po norvasc, Coreg 25 bid, and hydralazine 25mg  tid  Hyperlipidemia  Lipid lowering medication PTA:  Lipitor 20 mg daily  LDL 174, goal < 70  Increased to 80 mg daily  Continue statin at discharge  Diabetes  HgbA1c 11.4, goal < 7.0  Uncontrolled at home  Glucose stable on insulin   Decrease Lantus again today to 18U bid   Decrease basal NovoLog again today to 4U Q4h  SSI  CBG monitoring  Low grade fever with leukocytosis  Tmax 101.6 ->  afebrile  Leukocytosis - 14.0->12.1->9.9->11.2->11.5->11.3  UA WBC 21-50  CXR unremarkable, improving from prior  On meropenem now  Blood cultures negative 10/26 ;  Respiratory cultures - mixed flora   Abdominal abscess culture showed proteus mirabilis  CCM and trauma on board  Hypernatremia and elevated Cre/BUN  Na 149->148->150->147->144->143  Cre 1.31->1.09->0.86->0.80->0.73->0.73  Continue 1/2NS @ 50  On TF @ 45  On free water 300mg  Q4h  Continue BMP monitoring  Dysphagia s/p PEG complicated by abdominal wall abscess  Continue tube feeding @ 45 cc/h  PEG done 08/10/2018  Removed and replaced dislodged PEG 08/24/2018  CT showed Abdominal wall abscess - s/p open surgical drainage 09/01/18 persistent foul odor discharge. Reexploration  09/22/2018  Culture showed peroteus mirabilis  On meropenum now   Trauma surgery following, appreciate recs  Anemia   Hb 6.8->PRBC->7.1->6.8->8.2->8.4->8.9->8.6->9.0  Could be due to iron deficiency and acute uterine bleeding and recent surgeries  Iron panel showed iron deficiency  Put on iron pills  PRBC transfusion 1 U 09/29/2018 and 2U 09/07/18  CBC monitoring  Abnormal uterine bleeding, improved  Has been following with GYN  Was on megace in the past  CT abd/pelvis 09/03/18 - enlarged fibroid uterus  resumed megace 80mg  bid  Continue ASA and brilinta for now  Close CBC monitoring  Other Stroke Risk Factors  Obesity, Body mass index is 49.35 kg/m., recommend weight loss, diet and exercise as appropriate   Other Active Problems  Hypokalemia - on supplement, now resolved  Thrombocytosis - Plts - 489 -> 553-> 535->479->492->497->484->502 likely due to anemia  Hospital day # 32  This patient is critically ill due to worsening brainstem infarct, BA stenosis s/p stenting, hyperglycemia, hypertensive, fever, anemia, abdominal wall abscess s/p drainage, dislodged PEG and hypernatremiaand at significant risk of  neurological worsening, death form recurrent stroke, hemorrhagic conversion, CHF, seizure, aspiration, hemorrhagic shock. This patient's care requires constant monitoring of vital signs, hemodynamics, respiratory and cardiac monitoring, review of multiple databases, neurological assessment, discussion with family, other specialists and medical decision making of high complexity. I spent 30 minutes of neurocritical care time in the care of this patient.    Acute pontine and bilateral cerebellar ischemic infarcts secondary to Basilar artery and bilateral PCA stenosis s/p Basilar artery stenting.  Continue dual antiplatelet therapy with risk factor modification.  CO2 stepdown unit as per CCM. Hopefully transfer to [facility] when stable from abdominal surgical wound standpoint later this week. No family available at the bedside for discussion.   Antony Contras, MD  To contact Stroke Continuity provider, please refer to [email or web address] After hours, contact General Neurology

## 2018-09-15 NOTE — Procedures (Signed)
Objective Swallowing Evaluation: Type of Study: FEES-Fiberoptic Endoscopic Evaluation of Swallow   Patient Details  Name: Andrea [name] MRN: [id number] Date of Birth: [birth year]  Today's Date: 09/15/2018 Time: SLP Start Time (ACUTE ONLY): 1340 -SLP Stop Time (ACUTE ONLY): 1420  SLP Time Calculation (min) (ACUTE ONLY): 40 min   Past Medical History:  Past Medical History:  Diagnosis Date  . Diabetes mellitus   . Environmental allergies   . Fibroid   . High cholesterol   . Hx of cardiovascular stress test    a. ETT-MV 2/14:  EF 46% (normal by echo), low risk, no ischemia or scar  . Hx of echocardiogram    Echo 2/14: mild LVH, EF 55-60%, Gr 1 diast dysfn, mild LAE  . Hypertension   . Hyperthyroidism    Radioactive iodine ablation   Past Surgical History:  Past Surgical History:  Procedure Laterality Date  . [place], 2001   x 2  . ESOPHAGOGASTRODUODENOSCOPY N/A 08/24/2018   Procedure: ESOPHAGOGASTRODUODENOSCOPY (EGD);  Surgeon: Judeth Horn, MD;  Location: [place];  Service: General;  Laterality: N/A;  bedside  . GASTROSTOMY N/A 08/25/2018   Procedure: INSERTION OF GASTROSTOMY TUBE;  Surgeon: Judeth Horn, MD;  Location: [place];  Service: General;  Laterality: N/A;  . IR ANGIO INTRA EXTRACRAN SEL COM CAROTID INNOMINATE BILAT MOD SED  08/30/2018  . IR ANGIO VERTEBRAL SEL SUBCLAVIAN INNOMINATE UNI R MOD SED  08/13/2018  . IR CT HEAD LTD  09/03/2018  . IR INTRA CRAN STENT  08/21/2018  . LAPAROTOMY N/A 08/13/2018   Procedure: EXPLORATORY LAPAROTOMY;  Surgeon: Judeth Horn, MD;  Location: [place];  Service: General;  Laterality: N/A;  . LAPAROTOMY N/A 09/19/2018   Procedure: EXPLORATORY LAPAROTOMY;  Surgeon: Judeth Horn, MD;  Location: [place];  Service: General;  Laterality: N/A;  . PEG PLACEMENT N/A 09/02/2018   Procedure: PERCUTANEOUS ENDOSCOPIC GASTROSTOMY (PEG) PLACEMENT;  Surgeon: Judeth Horn, MD;  Location: [place];  Service: General;  Laterality:  N/A;  . RADIOLOGY WITH ANESTHESIA N/A 08/06/2018   Procedure: IR WITH ANESTHESIA;  Surgeon: Luanne Bras, MD;  Location: [place];  Service: Radiology;  Laterality: N/A;   HPI: 57 yo presented to ED with chest pain noted Rt hemiparesis in ED with acute Left paramedian brainstem infarct. Intubated 10/10 for angioplasty, extubated post procedure and reintubated. Repeat MRI with brainstem infarct extension and bil cerebellar infarcts. Peg/trach 08/19/2018.PMHx: HTN, DM, CKD   Subjective: alert, participatory and communicative    Assessment / Plan / Recommendation  CHL IP CLINICAL IMPRESSIONS 09/15/2018  Clinical Impression Pt presents a moderate dysphagia related more to neuropathology than effects of intubation/trach.  She presents with good integrity of larynx; no edema; adequate bilateral mobility of vocal folds.  Small ulceration noted on left upper third of inferior epiglottis.  PMV in place for FEES.  Pt demonstrated impaired oral control of POs with puree residue on tongue; tended to partition boluses into smaller sub-boluses before releasing into pharynx.  There was notable penetration to the vocal folds and trace aspiration of nectar-thick liquids before and after the swallow; penetration and possible trace aspiration of honey-thick liquids - no spontaneous cough was elicited during any aspiration events. Purees were managed with better airway protection; high, transient penetration observed, but no aspiration.  Pt tended to extend neck during study, requiring cues to lower chin to neutral.  For now, recommend initiating trials of pudding/purees with SLP only; continue nutrition via PEG.  Anticipate steady improvement in swallow;  pt will need repeat instrumental swallow study prior to diet advancement.  Pt verbalized understanding; talkative and participatory today.  Results D/W RN.   SLP Visit Diagnosis Dysphagia, oropharyngeal phase (R13.12)  Attention and concentration deficit following --   Frontal lobe and executive function deficit following --  Impact on safety and function Severe aspiration risk      CHL IP TREATMENT RECOMMENDATION 09/15/2018  Treatment Recommendations Therapy as outlined in treatment plan below     Prognosis 09/15/2018  Prognosis for Safe Diet Advancement Good  Barriers to Reach Goals --  Barriers/Prognosis Comment --    CHL IP DIET RECOMMENDATION 09/15/2018  SLP Diet Recommendations NPO  Liquid Administration via --  Medication Administration --  Compensations --  Postural Changes --      CHL IP OTHER RECOMMENDATIONS 09/15/2018  Recommended Consults --  Oral Care Recommendations Oral care QID  Other Recommendations --      CHL IP FOLLOW UP RECOMMENDATIONS 09/12/2018  Follow up Recommendations LTACH      CHL IP FREQUENCY AND DURATION 09/11/2018  Speech Therapy Frequency (ACUTE ONLY) min 2x/week  Treatment Duration 2 weeks           CHL IP ORAL PHASE 09/15/2018  Oral Phase Impaired  Oral - Pudding Teaspoon --  Oral - Pudding Cup --  Oral - Honey Teaspoon Left anterior bolus loss  Oral - Honey Cup --  Oral - Nectar Teaspoon Lingual/palatal residue  Oral - Nectar Cup --  Oral - Nectar Straw --  Oral - Thin Teaspoon --  Oral - Thin Cup --  Oral - Thin Straw --  Oral - Puree Lingual/palatal residue  Oral - Mech Soft --  Oral - Regular --  Oral - Multi-Consistency --  Oral - Pill --  Oral Phase - Comment --    CHL IP PHARYNGEAL PHASE 09/15/2018  Pharyngeal Phase Impaired  Pharyngeal- Pudding Teaspoon --  Pharyngeal --  Pharyngeal- Pudding Cup --  Pharyngeal --  Pharyngeal- Honey Teaspoon --  Pharyngeal --  Pharyngeal- Honey Cup Delayed swallow initiation-pyriform sinuses;Penetration/Aspiration before swallow;Penetration/Apiration after swallow;Trace aspiration  Pharyngeal Material enters airway, CONTACTS cords and not ejected out;Material enters airway, passes BELOW cords without attempt by patient to eject out (silent  aspiration)  Pharyngeal- Nectar Teaspoon --  Pharyngeal --  Pharyngeal- Nectar Cup Delayed swallow initiation-pyriform sinuses;Penetration/Aspiration before swallow;Penetration/Apiration after swallow;Trace aspiration  Pharyngeal Material enters airway, passes BELOW cords without attempt by patient to eject out (silent aspiration)  Pharyngeal- Nectar Straw --  Pharyngeal --  Pharyngeal- Thin Teaspoon --  Pharyngeal --  Pharyngeal- Thin Cup --  Pharyngeal --  Pharyngeal- Thin Straw --  Pharyngeal --  Pharyngeal- Puree Delayed swallow initiation-vallecula;Penetration/Apiration after swallow  Pharyngeal Material enters airway, remains ABOVE vocal cords and not ejected out  Pharyngeal- Mechanical Soft --  Pharyngeal --  Pharyngeal- Regular --  Pharyngeal --  Pharyngeal- Multi-consistency --  Pharyngeal --  Pharyngeal- Pill --  Pharyngeal --  Pharyngeal Comment --     No flowsheet data found.   [name] 09/15/2018, 2:57 PM

## 2018-09-15 NOTE — Progress Notes (Signed)
Patient ID: Andrea [name], female   DOB: 28-Jul-1961, 57 y.o.   MRN: [id number] On HTC and talking G tube working, a little drainage around site Midline and LLQ I&D sites cleaner  S/p PEG placement 10/21 S/p exploratory laparotomy with open gastrostomy tube and I&D 10/28 S/p takeback for repeat I&D abdominal wall + exlap 11/1  Continue wound care  Georganna Skeans, MD, MPH, [place] Trauma: [phone] General Surgery: [phone]

## 2018-09-16 LAB — BASIC METABOLIC PANEL
Anion gap: 6 (ref 5–15)
BUN: 23 mg/dL — ABNORMAL HIGH (ref 6–20)
CHLORIDE: 105 mmol/L (ref 98–111)
CO2: 23 mmol/L (ref 22–32)
CREATININE: 0.67 mg/dL (ref 0.44–1.00)
Calcium: 10.1 mg/dL (ref 8.9–10.3)
GFR calc non Af Amer: >60 mL/min (ref >60)
Glucose, Bld: 149 mg/dL — ABNORMAL HIGH (ref 70–99)
Potassium: 4.5 mmol/L (ref 3.5–5.1)
SODIUM: 134 mmol/L — AB (ref 135–145)

## 2018-09-16 LAB — GLUCOSE, CAPILLARY
GLUCOSE-CAPILLARY: 114 mg/dL — AB (ref 70–99)
GLUCOSE-CAPILLARY: 111 mg/dL — AB (ref 70–99)
Glucose-Capillary: 133 mg/dL — ABNORMAL HIGH (ref 70–99)
Glucose-Capillary: 143 mg/dL — ABNORMAL HIGH (ref 70–99)
Glucose-Capillary: 106 mg/dL — ABNORMAL HIGH (ref 70–99)

## 2018-09-16 LAB — CBC
HCT: 33.1 % — ABNORMAL LOW (ref 36.0–46.0)
Hemoglobin: 9.9 g/dL — ABNORMAL LOW (ref 12.0–15.0)
MCH: 25.7 pg — AB (ref 26.0–34.0)
MCHC: 29.9 g/dL — AB (ref 30.0–36.0)
MCV: 86 fL (ref 80.0–100.0)
Platelets: 544 10*3/uL — ABNORMAL HIGH (ref 150–400)
RBC: 3.85 MIL/uL — ABNORMAL LOW (ref 3.87–5.11)
RDW: 17.8 % — AB (ref 11.5–15.5)
WBC: 10 10*3/uL (ref 4.0–10.5)
nRBC: 0 % (ref 0.0–0.2)

## 2018-09-16 NOTE — Progress Notes (Signed)
Nutrition Follow-up  DOCUMENTATION CODES:   Morbid obesity  INTERVENTION:   -Continue Glucerna 1.2 @ 45 ml/hr via PEG   60 ml Prostat BID.   300 ml free water flush every 4 hours   Tube feeding regimen provides 1675 kcal (100% of needs), 124 grams of protein, and 875 ml of H2O. Total free water: 2675 ml, Total carbohydrate: 163 grams.   NUTRITION DIAGNOSIS:   Inadequate oral intake related to inability to eat as evidenced by NPO status.  Ongoing  GOAL:   Patient will meet greater than or equal to 90% of their needs  Met with TF  MONITOR:   Diet advancement, Labs, Weight trends, Skin, I & O's  REASON FOR ASSESSMENT:   Consult Enteral/tube feeding initiation and management  ASSESSMENT:   Andrea [name] is a 57 yo female with PMH of type 2 diabetes, HTN, CKD III, obesity, and anemia admitted for chest pain. Code Stroke called 10/10 in AM. 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 Intubated 10/10 in ICU.   10/21 trach and PEG 10/28 ex lap for PEG dislodgement, PEG replaced 11/1 ex lap, I&D 11/5- per [facility] notes, pressure injury to labia has healed 11/7- BSE- recommend NPO with continued long term nutrition support 11/8- off vent for 24 hours, on trach collar 11/11- s/p FEES- remain NPO with trials of puddings and purees with SLP; transferred from ICU to PCU 11/12- downsized to #4 cuffless trach  Pt in bed, on trach collar. Pt is in good spirits today, but complaining of being hungry. Pt expressed delight of puree feeding trials with SLP, but reports she would like to eat more. Discussed rationale for feeding trials and purpose of TF.   Reviewed wt history, which has been stable since admission. Noted moderate edema when completing nutrition-focused physical exam.   Glucerna 1.2 infusing via PEG at 45 ml/hr. Pt also receiving 60 ml Prostat BID and 300 ml free water flush every 4 hours. Complete regimen providing 1696 kcals, 124 grams protein, and 875 ml free water (2675  ml total free water). Noted pt receiving 163 total grams of carbohydrate with TF regimen. Complete regimen providing 100% of estimated nutritional needs.   Per [facility] notes, plan for SNF placement with LOG.   Labs reviewed: CBGS: 88-123 (inpatient orders for glycemic control are 0-20 units insulin aspart every 4 hours, 4 units insulin aspart every 4 hours, 18 units insulin glargine BID).  NUTRITION - FOCUSED PHYSICAL EXAM:    Most Recent Value  Orbital Region  No depletion  Upper Arm Region  No depletion  Thoracic and Lumbar Region  No depletion  Buccal Region  No depletion  Temple Region  No depletion  Clavicle Bone Region  No depletion  Clavicle and Acromion Bone Region  No depletion  Scapular Bone Region  No depletion  Dorsal Hand  No depletion  Patellar Region  No depletion  Anterior Thigh Region  No depletion  Posterior Calf Region  No depletion  Edema (RD Assessment)  Moderate  Hair  Reviewed  Eyes  Reviewed  Mouth  Reviewed  Skin  Reviewed  Nails  Reviewed       Diet Order:   Diet Order            Diet NPO time specified  Diet effective ____              EDUCATION NEEDS:   Not appropriate for education at this time  Skin:  Skin Assessment: Skin Integrity Issues: Skin  Integrity Issues:: Incisions Stage II: healed Incisions: abd  Last BM:  09/13/18  Height:   Ht Readings from Last 1 Encounters:  09/03/18 5' 4"  (1.626 m)    Weight:   Wt Readings from Last 1 Encounters:  09/15/18 130.4 kg    Ideal Body Weight:  54.5 kg  BMI:  Body mass index is 49.35 kg/m.  Estimated Nutritional Needs:   Kcal:  1600-1800  Protein:  120-135 grams  Fluid:  > 1.6 L    Shanita Kanan A. Jimmye Norman, RD, LDN, CDE Pager: [phone] After hours Pager: [phone]

## 2018-09-16 NOTE — [place] FL2 (Addendum)
[place] LEVEL OF CARE SCREENING TOOL     IDENTIFICATION  Patient Name: Andrea [name] Birthdate: 04/15/1961 Sex: female Admission Date (Current Location): 08/31/2018  [facility] and [place] Number:  Herbalist and Address:  [facility] [place], [place] [address] [place], [place] [address]      Provider Number: [id number]  Attending Physician Name and Address:  Garvin Fila, MD  Relative Name and Phone Number:  Dondra Spry, sister, [phone]  [name] sister [phone]  Current Level of Care: Hospital Recommended Level of Care: [place] Prior Approval Number:    Date Approved/Denied:   PASRR Number: [id number]  Discharge Plan: SNF    Current Diagnoses: Patient Active Problem List   Diagnosis Date Noted  . Goals of care, counseling/discussion   . Palliative care by specialist   . Advanced care planning/counseling discussion   . Sepsis with acute respiratory failure without septic shock ([place])   . Acute respiratory failure ([place])   . Tracheostomy present ([place])   . Tracheostomy status ([place])   . Hypertensive urgency 08/05/2018  . CKD (chronic kidney disease), stage III ([place]) 08/08/2018  . Acute ischemic stroke ([place]) 08/17/2018  . Basilar artery stenosis with infarction ([place]) 08/18/2018  . Respiratory insufficiency   . Low back pain 01/29/2017  . Cholelithiasis without cholecystitis 01/18/2017  . Anemia, iron deficiency 01/17/2017  . Abdominal pain   . Hyperglycemia   . Increased anion gap metabolic acidosis   . Weight loss 04/09/2016  . Headache 04/09/2016  . Fibroids 04/14/2014  . Abnormal uterine bleeding 04/14/2014  . Unequal leg length 04/16/2013  . Lightheadedness 12/17/2012  . Chest pain 12/14/2012  . Morbid obesity ([place]) 12/29/2011  . High cholesterol   . 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 ANXIETY 02/20/2010  . GERD 01/30/2010  . THYROID NODULE 01/17/2010  . THYROMEGALY 01/16/2010   . DEPRESSION 01/16/2010  . Insulin-requiring or dependent type II diabetes mellitus ([place]) 09/18/2007  . Essential hypertension 09/18/2007    Orientation RESPIRATION BLADDER Height & Weight     Self, Time, Situation, Place  Tracheostomy(FiO2: 28% uncuffed) Incontinent, Indwelling catheter Weight: 287 lb 7.7 oz (130.4 kg) Height:  5' 4"  (162.6 cm)  BEHAVIORAL SYMPTOMS/MOOD NEUROLOGICAL BOWEL NUTRITION STATUS      Incontinent Feeding tube  AMBULATORY STATUS COMMUNICATION OF NEEDS Skin   Extensive Assist Verbally PU Stage and Appropriate Care(Pressure injury on vagina)  Surgical incision on abdomen with ABD/gauze                       Personal Care Assistance Level of Assistance  Bathing, Feeding, Dressing Bathing Assistance: Maximum assistance Feeding assistance: Maximum assistance Dressing Assistance: Maximum assistance     Functional Limitations Info  Sight, Hearing, Speech Sight Info: Adequate Hearing Info: Adequate Speech Info: Impaired(Tracheostomy)- 28% 65m cuffless 5Lo2    SPECIAL CARE FACTORS FREQUENCY  PT (By licensed PT), OT (By licensed OT), Speech therapy     PT Frequency: 5x/week OT Frequency: 3x/week     Speech Therapy Frequency: 3x/week      Contractures Contractures Info: Not present    Additional Factors Info  Code Status, Allergies, Insulin Sliding Scale Code Status Info: Full Allergies Info: Tramadol   Insulin Sliding Scale Info: Every 4 hours       Current Medications (09/16/2018):  This is the current hospital active medication list Current Facility-Administered Medications  Medication Dose Route Frequency Provider Last Rate Last Dose  .  0.9 %  sodium chloride infusion   Intravenous PRN Judeth Horn, MD   Stopped at 09/12/18 1830  . acetaminophen (TYLENOL) solution 650 mg  650 mg Per Tube Q4H PRN Judeth Horn, MD   650 mg at 09/08/18 1453  . albuterol (PROVENTIL) (2.5 MG/3ML) 0.083% nebulizer solution 2.5 mg  2.5 mg Nebulization Q3H  PRN Judeth Horn, MD   2.5 mg at 09/14/18 1208  . amLODipine (NORVASC) tablet 10 mg  10 mg Per Tube Daily Judeth Horn, MD   10 mg at 09/16/18 0852  . aspirin chewable tablet 81 mg  81 mg Per Tube Daily Judeth Horn, MD   81 mg at 09/16/18 0854  . atorvastatin (LIPITOR) tablet 80 mg  80 mg Per Tube q1800 Judeth Horn, MD   80 mg at 09/15/18 1723  . bisacodyl (DULCOLAX) suppository 10 mg  10 mg Rectal Daily PRN Judeth Horn, MD      . carvedilol (COREG) tablet 25 mg  25 mg Per Tube BID WC Judeth Horn, MD   25 mg at 09/16/18 0853  . chlorhexidine gluconate (MEDLINE KIT) (PERIDEX) 0.12 % solution 15 mL  15 mL Mouth Rinse BID Judeth Horn, MD   15 mL at 09/16/18 0855  . diphenhydrAMINE-zinc acetate (BENADRYL) 2-0.1 % cream   Topical BID PRN Judeth Horn, MD      . docusate (COLACE) 50 MG/5ML liquid 100 mg  100 mg Per Tube Daily Judeth Horn, MD   100 mg at 09/15/18 0944  . feeding supplement (GLUCERNA 1.2 CAL) liquid 1,000 mL  1,000 mL Per Tube Continuous Judeth Horn, MD 45 mL/hr at 09/14/18 1200    . feeding supplement (PRO-STAT SUGAR FREE 64) liquid 60 mL  60 mL Per Tube BID Judeth Horn, MD   60 mL at 09/16/18 0854  . fentaNYL (SUBLIMAZE) injection 50 mcg  50 mcg Intravenous Q1H PRN Icard, Bradley L, DO   50 mcg at 09/15/18 0337  . free water 300 mL  300 mL Per Tube Q4H Icard, Bradley L, DO   300 mL at 09/16/18 1200  . gabapentin (NEURONTIN) 250 MG/5ML solution 100 mg  100 mg Per Tube Q12H Kipp Brood, MD   100 mg at 09/16/18 0852  . hydrALAZINE (APRESOLINE) injection 20-40 mg  20-40 mg Intravenous Q6H PRN Judeth Horn, MD      . hydrALAZINE (APRESOLINE) tablet 25 mg  25 mg Per Tube Sylvan Cheese, MD   25 mg at 09/16/18 [phone]  . HYDROcodone-acetaminophen (HYCET) 7.5-325 mg/15 ml solution 10 mL  10 mL Oral Q6H PRN Judeth Horn, MD   10 mL at 09/16/18 0905  . insulin aspart (novoLOG) injection 0-20 Units  0-20 Units Subcutaneous Q4H Judeth Horn, MD   3 Units at 09/16/18 1309  . insulin aspart  (novoLOG) injection 4 Units  4 Units Subcutaneous Q4H Rosalin Hawking, MD   4 Units at 09/15/18 1700  . insulin glargine (LANTUS) injection 18 Units  18 Units Subcutaneous BID Rosalin Hawking, MD   18 Units at 09/15/18 2315  . labetalol (NORMODYNE,TRANDATE) injection 40 mg  40 mg Intravenous Q2H PRN Judeth Horn, MD   20 mg at 10/02/2018 1801  . liver oil-zinc oxide (DESITIN) 40 % ointment   Topical QID Judeth Horn, MD      . MEDLINE mouth rinse  15 mL Mouth Rinse 10 times per day Judeth Horn, MD   15 mL at 09/16/18 1308  . megestrol (MEGACE) tablet 80 mg  80 mg Oral BID  Rosalin Hawking, MD   80 mg at 09/16/18 0854  . multivitamin liquid 15 mL  15 mL Per Tube Daily Judeth Horn, MD   15 mL at 09/16/18 0852  . nystatin (MYCOSTATIN) 100000 UNIT/ML suspension 500,000 Units  5 mL Oral QID Rush Farmer, MD   500,000 Units at 09/16/18 1309  . ondansetron (ZOFRAN) injection 4 mg  4 mg Intravenous Q6H PRN Judeth Horn, MD      . pantoprazole sodium (PROTONIX) 40 mg/20 mL oral suspension 40 mg  40 mg Per Tube Daily Judeth Horn, MD   40 mg at 09/16/18 0853  . sennosides (SENOKOT) 8.8 MG/5ML syrup 5 mL  5 mL Per Tube QHS PRN Judeth Horn, MD      . thiamine (VITAMIN B-1) tablet 100 mg  100 mg Per Tube Daily Rosalin Hawking, MD   100 mg at 09/16/18 0853  . ticagrelor (BRILINTA) tablet 90 mg  90 mg Per Tube BID Judeth Horn, MD   90 mg at 09/16/18 3614     Discharge Medications: Please see discharge summary for a list of discharge medications.  Relevant Imaging Results:  Relevant Lab Results:   Additional Information SSN: [place], [place]  I have personally examined this patient, reviewed notes, independently viewed imaging studies, participated in medical decision making and plan of care.ROS completed by me personally and pertinent positives fully documented  I have made any additions or clarifications directly to the above note. Agree with note above.  Antony Contras, MD Medical  Director [facility] Pager: [phone] 09/16/2018 1:56 PM

## 2018-09-16 NOTE — Social Work (Signed)
CSW has updated FL2 with current pt needs.  Pt not eligible for LTAC placement. Will be LOG at SNF who is able to accept LOG and manage pt care needs. CSW has sent out referral.  Westley Hummer, MSW, [place] Work [phone]

## 2018-09-16 NOTE — Progress Notes (Signed)
Page to [facility] with blood sugars 98 @ 2000 and 88 @ 0000.  Talked with Pharmacy at this hour and agreed best to hold Lantus 15U.  [place] for call back.

## 2018-09-16 NOTE — Progress Notes (Signed)
RT note: Changed patient trach to a #4 cuff less trach. Patient tolerated well vital signs stable at this time. Patient continues to cough up blood tinged secretions. Verified placement with ETCO2 detector and BLB sounds.

## 2018-09-16 NOTE — Progress Notes (Signed)
Occupational Therapy Treatment Patient Details Name: Andrea [name] MRN: [id number] DOB: 29-Jan-1961 Today's Date: 09/16/2018    History of present illness 57 yo presented to ED with chest pain noted Rt hemiparesis in ED with acute Left paramedian brainstem infarct. Intubated 10/10 for angioplasty, extubated post procedure and reintubated. Repeat MRI with brainstem infarct extension and bil cerebellar infarcts. Peg/trach with return to vent 08/11/2018. 10/22-29 on trach collar, return to vent 10/29. 10/28 I & D of abdominal wall abscess with placement of penrose drain and G-tube, 11/1 repeat ex lap. 11/7 return to trach collar.  PMHx: HTN, DM, CKD   OT comments  Excellent session/partial session with ST. PMSV used during session, pt appropriate and joking with therapist throughout session. Pt states she's "tired but knows she needs to work". Pt continues to demonstrate steady increased strength LUE, although not functional at this time. Increased active anterior and posterior weight shift/trunk control in supported sitting with increase active control of head movement.  No active movement noted RUE. Encourage nsg to keep RUE supported on 2 pillows and use caution during mobility to decrease risk of shoulder injury. VSS throughout session. Will continue to follow acutely. Pt will need extensive rehab at SNF. Pt very appreciative.  Encourage nsg staff to tilt pt TID.  Follow Up Recommendations  SNF;Supervision/Assistance - 24 hour    Equipment Recommendations  Other (comment)    Recommendations for Other Services      Precautions / Restrictions Precautions Precautions: Fall Precaution Comments: Peg/trach       Mobility Bed Mobility Overal bed mobility: Needs Assistance                Transfers                      Balance     Sitting balance-Leahy Scale: Zero Sitting balance - Comments: increased ability to incorporate trunk in movemetns while seated                                    ADL either performed or assessed with clinical judgement   ADL Overall ADL's : Needs assistance/impaired Eating/Feeding: Total assistance Eating/Feeding Details (indicate cue type and reason): working on hand to mouth movement with hand over hand assist; able to facilitae at shoulder,elbow andhand Grooming: Maximal assistance Grooming Details (indicate cue type and reason): hand ove hand with oral care and wiping mouth                               General ADL Comments: Pt moved into seated opsition in bed; facilitation of anterior and posterior weight shifts; utilized reaching/crossing midlien to facilitate trunk; increased active haed/neck movement     Vision   Vision Assessment?: Vision impaired- to be further tested in functional context Additional Comments: no report of diplopia   Perception     Praxis      Cognition Arousal/Alertness: Awake/alert Behavior During Therapy: WFL for tasks assessed/performed Overall Cognitive Status: Impaired/Different from baseline Area of Impairment: Attention;Safety/judgement;Awareness                   Current Attention Level: Selective   Following Commands: Follows one step commands consistently   Awareness: Emergent Problem Solving: Slow processing General Comments: Pt appropriate yet appears to be confused at times; difficult to assess  Exercises Exercises: General Upper Extremity General Exercises - Upper Extremity Shoulder Flexion: PROM;Both;10 reps;Supine;AAROM Shoulder ABduction: PROM;Both;10 reps;Supine;AAROM Shoulder ADduction: PROM;Both;10 reps;Supine;AAROM;Left Shoulder Horizontal ABduction: PROM;AROM;Both;15 reps;Seated Shoulder Horizontal ADduction: PROM;AROM;Both;15 reps;Seated Elbow Flexion: PROM;Both;10 reps;AAROM Elbow Extension: PROM;Both;10 reps;Supine;AAROM;Left Wrist Flexion: PROM;Both;10 reps;Supine;AAROM;Left Wrist Extension: PROM;Both;10  reps;Supine;AAROM Digit Composite Flexion: PROM;Both;5 reps;Supine;AAROM;Left Composite Extension: PROM;Both;5 reps;Supine;AAROM;Left Chair Push Up: PROM;Both;5 reps   Shoulder Instructions       General Comments      Pertinent Vitals/ Pain       Pain Assessment: Faces Faces Pain Scale: Hurts a little bit Pain Location: general discomfort Pain Descriptors / Indicators: Grimacing Pain Intervention(s): Limited activity within patient's tolerance  Home Living                                          Prior Functioning/Environment              Frequency  Min 2X/week        Progress Toward Goals  OT Goals(current goals can now be found in the care plan section)  Progress towards OT goals: Progressing toward goals;OT to reassess next treatment  Acute Rehab OT Goals Patient Stated Goal: per family for pt to get stronger OT Goal Formulation: With patient/family Time For Goal Achievement: 09/26/18 Potential to Achieve Goals: Fair ADL Goals Pt Will Perform Grooming: with max assist;bed level Pt Will Perform Upper Body Bathing: with max assist;bed level Pt/caregiver will Perform Home Exercise Program: Increased ROM;With written HEP provided;Both right and left upper extremity Additional ADL Goal #1: Pt will sit EOB with +2 Max A x 10 min in preparation for functional activities.  Plan Discharge plan remains appropriate    Co-evaluation    PT/OT/SLP Co-Evaluation/Treatment: Yes Reason for Co-Treatment: Complexity of the patient's impairments (multi-system involvement)   OT goals addressed during session: ADL's and self-care;Strengthening/ROM      AM-PAC PT "6 Clicks" Daily Activity     Outcome Measure   Help from another person eating meals?: Total Help from another person taking care of personal grooming?: Total Help from another person toileting, which includes using toliet, bedpan, or urinal?: Total Help from another person bathing (including  washing, rinsing, drying)?: Total Help from another person to put on and taking off regular upper body clothing?: Total Help from another person to put on and taking off regular lower body clothing?: Total 6 Click Score: 6    End of Session Equipment Utilized During Treatment: Oxygen  OT Visit Diagnosis: Other abnormalities of gait and mobility (R26.89);Muscle weakness (generalized) (M62.81);Low vision, both eyes (H54.2);Feeding difficulties (R63.3);Other symptoms and signs involving cognitive function;Hemiplegia and hemiparesis;Pain Hemiplegia - Right/Left: Right Hemiplegia - dominant/non-dominant: Non-Dominant Hemiplegia - caused by: Nontraumatic intracerebral hemorrhage;Cerebral infarction Pain - part of body: (general discomfort)   Activity Tolerance Patient tolerated treatment well   Patient Left in bed;with call bell/phone within reach;with SCD's reapplied(chair position)   Nurse Communication Mobility status        Time: 1430-1500 OT Time Calculation (min): 30 min  Charges: OT General Charges $OT Visit: 1 Visit OT Treatments $Self Care/Home Management : 8-22 mins $Neuromuscular Re-education: 8-22 mins    Christohper Dube,HILLARY 09/16/2018, 6:18 PM  Maurie Boettcher, OT/L   Acute OT Clinical Specialist Acute Rehabilitation Services Pager [phone] Office [phone]

## 2018-09-16 NOTE — Progress Notes (Signed)
  Speech Language Pathology Treatment: Dysphagia;Passy Muir Speaking valve  Patient Details Name: Andrea [name] MRN: [id number] DOB: 1961-04-03 Today's Date: 09/16/2018 Time: 6754-4920 SLP Time Calculation (min) (ACUTE ONLY): 15 min  Assessment / Plan / Recommendation Clinical Impression  Pt's trach changed to #4 cuffless.  Co-treated with OT, who had placed PMV prior to arrival.  Pt verbalizing with good quality voice; continues with decreased breath support for speech, with difficulty modulating output to support longer utterances. Requires repetitions in order to be understood by listener, but overall intelligibility has improved tremendously (overall approximately 80-90%).  Respiratory muscle trainer utilized - pt required mod verbal cues to coordinate quick exhalations. Rated effort at 5. Completed four sets of five repetitions each.  Pt eager for POs after yesterday's FEES.  Seated upright with cues to maintain head in neutral, pt consumed four oz pudding with min verbal cues to clear throat/re-swallow, engage in effortful swallow. Pt working with great effort in therapy.  Anticipate good progress with swallow.  Will continue toward POC.   HPI HPI: 57 yo presented to ED with chest pain noted Rt hemiparesis in ED with acute Left paramedian brainstem infarct. Intubated 10/10 for angioplasty, extubated post procedure and reintubated. Repeat MRI with brainstem infarct extension and bil cerebellar infarcts. Peg/trach 08/10/2018.PMHx: HTN, DM, CKD      SLP Plan  Continue with current plan of care       Recommendations  Diet recommendations: NPO      Patient may use Passy-Muir Speech Valve: During all therapies with supervision;Intermittently with supervision PMSV Supervision: Full         Oral Care Recommendations: Oral care QID Follow up Recommendations: Skilled Nursing facility SLP Visit Diagnosis: Dysphagia, oropharyngeal phase (R13.12);Aphonia (R49.1) Plan: Continue with  current plan of care       GO                Assunta Curtis 09/16/2018, 3:24 PM  Lousie Calico L. Tivis Ringer, [place] Office number [phone] Pager [phone]

## 2018-09-16 NOTE — Progress Notes (Signed)
Noted MD and PT recommending LTAC for disposition.  Pt is NOT LTAC appropriate, as she has no payor source.  She has currently been weaned to trach collar, and we are pursuing [facility] placement with a letter of guarantee from our facility.  Financial counselor assisting family with Medicaid disability.    CSW will follow with updates as available.   Reinaldo Raddle, RN, BSN  Trauma/Neuro ICU Case Manager [phone]

## 2018-09-16 NOTE — Progress Notes (Signed)
Physical Therapy Treatment Patient Details Name: Andrea [name] MRN: [id number] DOB: 01-16-1961 Today's Date: 09/16/2018    History of Present Illness 57 yo presented to ED with chest pain noted Rt hemiparesis in ED with acute Left paramedian brainstem infarct. Intubated 10/10 for angioplasty, extubated post procedure and reintubated. Repeat MRI with brainstem infarct extension and bil cerebellar infarcts. Peg/trach with return to vent 08/13/2018. 10/22-29 on trach collar, return to vent 10/29. 10/28 I & D of abdominal wall abscess with placement of penrose drain and G-tube, 11/1 repeat ex lap. 11/7 return to trach collar.  PMHx: HTN, DM, CKD    PT Comments    Pt eager and excited to stand in tilt bed today. Pt tolerated 8 min at 41 pounds before BP dropped to 62/30, pt asymptomatic. BP once returned to supine 96/58 and then after 5 min of supine 136/70. Pt remains to have minimal bilat UE and LE active movement but did initiate a good L quadset in standing. Acute PT to cont to follow.   Follow Up Recommendations  LTACH;Supervision/Assistance - 24 hour     Equipment Recommendations  Hospital bed;Wheelchair (measurements PT);Other (comment)    Recommendations for Other Services       Precautions / Restrictions Precautions Precautions: Fall Precaution Comments: Peg/trach(PSMV) Restrictions Weight Bearing Restrictions: No    Mobility  Bed Mobility                  Transfers Overall transfer level: Needs assistance Equipment used: (tilt bed) Transfers: Sit to/from Stand Sit to Stand: Total assist;+2 safety/equipment         General transfer comment: total assist with use of tilt bed to transition to standing 80min in full standing. Pt with stable VSS and enjoying being upright with exercises performed in standing.   Ambulation/Gait                 Stairs             Wheelchair Mobility    Modified Rankin (Stroke Patients Only) Modified Rankin  (Stroke Patients Only) Pre-Morbid Rankin Score: No symptoms Modified Rankin: Severe disability     Balance           Standing balance support: Bilateral upper extremity supported Standing balance-Leahy Scale: Zero Standing balance comment: utilized tilt bed and straps                            Cognition Arousal/Alertness: Awake/alert Behavior During Therapy: WFL for tasks assessed/performed Overall Cognitive Status: Within Functional Limits for tasks assessed                                 General Comments: pt followed all commands asked, initiated conversation, able to hold an appropriate conversation      Exercises Other Exercises Other Exercises: worked on bilat quad sets in standing, pt able to complete with L LE but only occasional trace with R UE. Other Exercises: Pt tolerated 8 min in standing before BP dec to 62/30    General Comments General comments (skin integrity, edema, etc.): Ortho static BP at 62/30 in standing, pt asymptomatic      Pertinent Vitals/Pain Pain Assessment: Faces Faces Pain Scale: No hurt    Home Living                      Prior Function  PT Goals (current goals can now be found in the care plan section) Progress towards PT goals: Progressing toward goals    Frequency    Min 2X/week      PT Plan Current plan remains appropriate    Co-evaluation              AM-PAC PT "6 Clicks" Daily Activity  Outcome Measure  Difficulty turning over in bed (including adjusting bedclothes, sheets and blankets)?: Unable Difficulty moving from lying on back to sitting on the side of the bed? : Unable Difficulty sitting down on and standing up from a chair with arms (e.g., wheelchair, bedside commode, etc,.)?: Unable Help needed moving to and from a bed to chair (including a wheelchair)?: Total Help needed walking in hospital room?: Total Help needed climbing 3-5 steps with a railing? :  Total 6 Click Score: 6    End of Session   Activity Tolerance: Patient tolerated treatment well Patient left: in bed;with call bell/phone within reach;with nursing/sitter in room Nurse Communication: Mobility status;Need for lift equipment;Other (comment) PT Visit Diagnosis: Other abnormalities of gait and mobility (R26.89);Muscle weakness (generalized) (M62.81);Other symptoms and signs involving the nervous system (R29.898);Hemiplegia and hemiparesis Hemiplegia - Right/Left: Right Hemiplegia - dominant/non-dominant: Dominant Hemiplegia - caused by: Cerebral infarction     Time: 1247-1319 PT Time Calculation (min) (ACUTE ONLY): 32 min  Charges:  $Therapeutic Exercise: 8-22 mins $Neuromuscular Re-education: 8-22 mins                     Kittie Plater, PT, DPT Acute Rehabilitation Services Pager #: [phone] Office #: [phone]    Berline Lopes 09/16/2018, 2:04 PM

## 2018-09-16 NOTE — Progress Notes (Signed)
STROKE TEAM PROGRESS NOTE   SUBJECTIVE (INTERVAL HISTORY) No new events.  She was transferred to stepdown unit and is on trach collar . Neurologically unchanged awake following commands and moving left side purposefully. Stable dense right hemiplegia. Discussed with Dr. Lynetta Mare and Grandville Silos plan   to transfer to neurology   likely to [facility] later.      OBJECTIVE Vitals:   09/16/18 0634 09/16/18 0852 09/16/18 0853 09/16/18 0913  BP: 135/78 (!) 150/75    Pulse:   84 98  Resp:      Temp:  98.2 F (36.8 C)    TempSrc:  Oral    SpO2:  93%  95%  Weight:      Height:        CBC:  Recent Labs  Lab 09/13/18 0504 09/16/18 1013  WBC 10.2 10.0  HGB 10.2* 9.9*  HCT 34.7* 33.1*  MCV 87.4 86.0  PLT 543* 544*    Basic Metabolic Panel:  Recent Labs  Lab 09/13/18 0504 09/16/18 1013  NA 139 134*  K 4.5 4.5  CL 107 105  CO2 24 23  GLUCOSE 96 149*  BUN 29* 23*  CREATININE 0.68 0.67  CALCIUM 9.9 10.1    Lipid Panel:     Component Value Date/Time   CHOL 256 (H) 08/15/2018 0512   TRIG 248 (H) 08/15/2018 0512   HDL 35 (L) 08/15/2018 0512   CHOLHDL 7.3 08/15/2018 0512   VLDL 50 (H) 08/15/2018 0512   LDLCALC 171 (H) 08/15/2018 0512   HgbA1c:  Lab Results  Component Value Date   HGBA1C 11.4 (H) 08/15/2018   Urine Drug Screen:     Component Value Date/Time   LABOPIA NONE DETECTED 08/13/2018 1836   COCAINSCRNUR NONE DETECTED 08/12/2018 1836   LABBENZ NONE DETECTED 08/07/2018 1836   AMPHETMU NONE DETECTED 08/05/2018 1836   THCU NONE DETECTED 08/26/2018 1836   LABBARB NONE DETECTED 08/26/2018 1836    Alcohol Level No results found for: ETH  IMAGING  Ct Angio Head W Or Wo Contrast Ct Angio Neck W Or Wo Contrast 08/15/2018 IMPRESSION:  1. Interval basilar to left proximal PCA stenting. The density of the stent walls precludes detection of in stent stenosis; there is a degree of wasting at the mid basilar segment. There is flow in the left PCA beyond the stent such that  there is presumed stent patency.  2. Known lower pontine infarct. There are new small cerebellar infarcts since brain MRI yesterday.  3. Severe left P2 segment stenosis, also seen previously.  4. Moderate atheromatous narrowing in the proximal left MCA.    Ct Angio Head W Or Wo Contrast Ct Angio Neck W Or Wo Contrast 08/13/2018 IMPRESSION:  1. 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 No definite acute thrombus identified.  2. Severe stenosis left posterior cerebral artery and moderate stenosis right posterior cerebral artery.  3. Mild stenosis left MCA and moderate stenosis left MCA bifurcation.  4. No significant carotid or vertebral artery stenosis in the neck.    Ct Head Wo Contrast 08/08/2018 IMPRESSION:  No acute intracranial abnormalities. Mild white matter changes likely due to small vessel ischemia.    Mr Brain Wo Contrast 08/13/2018 IMPRESSION:  1. Acute nonhemorrhagic infarct involving the left paramedian brainstem. The infarct crosses midline.  2. Other periventricular and subcortical white matter disease is moderately advanced for age. This likely reflects the sequela of chronic microvascular ischemia.  3. Tapering of  the dens with prominent soft tissue pannus. 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    Ct Head Code Stroke Wo Contrast 08/22/2018  IMPRESSION:  1. No acute abnormality and no change from earlier today  2. ASPECTS is 10 3.    Cerebral Angiogram 08/30/2018 S/P 4 vessel cerebral arteriogram RT CFA approach. 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 S/P stent assisted angioplasty of distal basilar artery with patency of 80 to 90 %. 2.Approx 70 % stenosis of LT ICA supraclinoid seg   MRI 08/15/18 1. Progressive acute pontine infarct. New patchy bilateral cerebellar  infarction. New tiny left thalamic infarcts. 2. Preserved flow void in the stented basilar. 3. left facial/submandibular edema, please correlate with neck exam. Edited result: IMPRESSION: 1. Progressive acute pontine infarct. New patchy bilateral cerebellar infarction. New tiny left thalamic infarcts. 2. Preserved flow void in the stented basilar.  TTE - Left ventricle: The cavity size was normal. There was severe   concentric hypertrophy. Systolic function was normal. The   estimated ejection fraction was in the range of 60% to 65%. Wall   motion was normal; there were no regional wall motion   abnormalities. Doppler parameters are consistent with abnormal   left ventricular relaxation (grade 1 diastolic dysfunction). The   E/e&' ratio is <8, suggesting normal LV filling pressure. - Aortic valve: Trileaflet; mildly calcified leaflets.   Transvalvular velocity was minimally increased. There was no   regurgitation. Mean gradient (S): 12 mm Hg. - Mitral valve: Mildly thickened leaflets . There was trivial   regurgitation. - Left atrium: The atrium was normal in size. - Inferior vena cava: The vessel was dilated. The respirophasic   diameter changes were blunted (< 50%), consistent with elevated   central venous pressure. Impressions: - LVEF 60-65%, severe LVH, normal wall motion, grade 1 DD, normal   LV filling pressure, minimally increased aortic velocity without   signficant stenosis, trivial MR, normal LA size, dilated IVC.  CT abdomen and pelvis 09/03/18 Mild bilateral posterior basilar subsegmental atelectasis. Interval placement of new gastrostomy tube with tip and balloon within gastric lumen. Surgical drain is seen in the soft tissues in previous gastrostomy site. Small amount of gas and stranding is seen in the peritoneal fat anterior and inferior to the stomach which most likely is related to gastrostomy placement. Probable surgical wound seen in right upper quadrant of  the anterior abdominal wall. 8.2 x 2.3 cm gas and fluid collection is seen in the anterior abdominal wall which is unchanged compared to prior exam. Possible abscess cannot be excluded. Enlarged fibroid uterus.  PHYSICAL EXAM  Temp:  [98.2 F (36.8 C)-98.7 F (37.1 C)] 98.2 F (36.8 C) (11/12 0852) Pulse Rate:  [69-98] 98 (11/12 0913) Resp:  [17-22] 21 (11/11 1800) BP: (130-150)/(64-78) 150/75 (11/12 0852) SpO2:  [93 %-99 %] 95 % (11/12 0913) FiO2 (%):  [25 %-28 %] 25 % (11/12 1231)  General -  Obese middle-aged African-American lady, on trach collar, not in distress. She has abdominal surgical wound and drainage from the abdominal wall, dressing clean  Ophthalmologic - fundi not visualized due to noncooperation.  Cardiovascular - regular rate and rhythm  Neuro - post trach, on trach collar, eyes open, awake, alert. No ptosis or EOMI, denies diplopia.  PERRL.  Able to wiggle left hand fingers and left foot toes only.  Follows commands and good eye contact.  Speech  is mildly dysarthric.   Dense right hemiplegia persists.  ASSESSMENT/PLAN [name] is a 57 y.o. female with history of difficult to control hypertension, insulin-dependent diabetes, obesity, hypothyroidism status post ablation presenting with chest discomfort, right-sided weakness, slurred speech and right facial droop. She did not receive IV t-PA due to late presentation. S/P stent assisted angioplasty of distal basilar artery.  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 Worsening symptoms with extension of pontine/medullary infarcts with new small bilateral cerebellar infarcts without evidence of stent re-stenosis or occlusion  Resultant quadriplegia, on trach and peg  CT head - No acute intracranial abnormalities.  CTA H&N 08/09/2018 - Extensive atherosclerotic disease in the basilar with focal critical stenosis in the mid to distal basilar. No definite acute  thrombus identified. Severe stenosis left posterior cerebral artery  MRI head - Acute nonhemorrhagic infarct involving the left paramedian brainstem.   CTA H&N 08/15/2018 - stent too dense to see BA lumen, but distal flow preserved. New small cerebellar infarcts since brain MRI yesterday.  Severe left P2 segment stenosis, also seen previously.   Repeat MRI - extension of b/l pontine and upper medullary infarcts with new b/l cerebellar infarcts.  2D Echo - EF 60-65%  LDL - 174  HgbA1c - 11.1  VTE prophylaxis - heparin subq  aspirin 81 mg daily prior to admission, now resumed on Brilinta 90 mg bid and ASA 81 mg daily.   Patient counseled to be compliant with her antithrombotic medications  Ongoing aggressive stroke risk factor management  Therapy recommendations:  LTACH  Disposition:  Pending - will wait once pt more improved with speaking valve, need to discuss about further plan with her   Intracranial stenosis  BA mid to distal severe stenosis s/p BA stenting  Left PCA severe stenosis, R PCA moderate stenosis  Left MCA moderate stenosis  Uncontrolled stroke risk factors with HLD, HTN, DM  Non compliance with meds at home  Respiratory failure  S/p trach 09/04/2018  On trach collar now  Working with speech therapist for speaking valve  CCM on board  Hypertension  Stable but fluctuate with position   BP goal 130-150   On po norvasc, Coreg 25 bid, and hydralazine 25mg  tid  Hyperlipidemia  Lipid lowering medication PTA:  Lipitor 20 mg daily  LDL 174, goal < 70  Increased to 80 mg daily  Continue statin at discharge  Diabetes  HgbA1c 11.4, goal < 7.0  Uncontrolled at home  Glucose stable on insulin   Decrease Lantus again today to 18U bid   Decrease basal NovoLog again today to 4U Q4h  SSI  CBG monitoring  Low grade fever with leukocytosis  Tmax 101.6 -> afebrile  Leukocytosis - 14.0->12.1->9.9->11.2->11.5->11.3  UA WBC 21-50  CXR  unremarkable, improving from prior  On meropenem now  Blood cultures negative 10/26 ;  Respiratory cultures - mixed flora   Abdominal abscess culture showed proteus mirabilis  CCM and trauma on board  Hypernatremia and elevated Cre/BUN  Na 149->148->150->147->144->143  Cre 1.31->1.09->0.86->0.80->0.73->0.73  Continue 1/2NS @ 50  On TF @ 45  On free water 300mg  Q4h  Continue BMP monitoring  Dysphagia s/p PEG complicated by abdominal wall abscess  Continue tube feeding @ 45 cc/h  PEG done 08/27/2018  Removed and replaced dislodged PEG 08/30/2018  CT showed Abdominal wall abscess - s/p open surgical drainage 09/02/2018 persistent foul odor discharge. Reexploration 09/28/2018  Culture showed peroteus mirabilis  On meropenum now  Trauma surgery following, appreciate recs  Anemia   Hb 6.8->PRBC->7.1->6.8->8.2->8.4->8.9->8.6->9.0  Could be due to iron deficiency and acute uterine bleeding and recent surgeries  Iron panel showed iron deficiency  Put on iron pills  PRBC transfusion 1 U 10/04/2018 and 2U 09/07/18  CBC monitoring  Abnormal uterine bleeding, improved  Has been following with GYN  Was on megace in the past  CT abd/pelvis 09/03/18 - enlarged fibroid uterus  resumed megace 80mg  bid  Continue ASA and brilinta for now  Close CBC monitoring  Other Stroke Risk Factors  Obesity, Body mass index is 49.35 kg/m., recommend weight loss, diet and exercise as appropriate   Other Active Problems  Hypokalemia - on supplement, now resolved  Thrombocytosis - Plts - 489 -> 553-> 535->479->492->497->484->502 likely due to anemia  Hospital day # 33  This patient is critically ill due to worsening brainstem infarct, BA stenosis s/p stenting, hyperglycemia, hypertensive, fever, anemia, abdominal wall abscess s/p drainage, dislodged PEG and hypernatremiaand at significant risk of neurological worsening, death form recurrent stroke, hemorrhagic conversion, CHF,  seizure, aspiration, hemorrhagic shock. This patient's care requires constant monitoring of vital signs, hemodynamics, respiratory and cardiac monitoring, review of multiple databases, neurological assessment, discussion with family, other specialists and medical decision making of high complexity. I spent 30 minutes of neurocritical care time in the care of this patient.    Acute pontine and bilateral cerebellar ischemic infarcts secondary to Basilar artery and bilateral PCA stenosis s/p Basilar artery stenting.  Continue dual antiplatelet therapy with risk factor modification.She has moved to stepdown unit   Hopefully transfer to [facility] when stable from abdominal surgical wound standpoint later this week. No family available at the bedside for discussion.  D/w Dr Lynetta Mare and Grandville Silos. No family available at bedside for discussion Antony Contras, MD  To contact Stroke Continuity provider, please refer to [email or web address] After hours, contact General Neurology

## 2018-09-17 LAB — GLUCOSE, CAPILLARY
GLUCOSE-CAPILLARY: 98 mg/dL (ref 70–99)
Glucose-Capillary: 131 mg/dL — ABNORMAL HIGH (ref 70–99)
Glucose-Capillary: 109 mg/dL — ABNORMAL HIGH (ref 70–99)
Glucose-Capillary: 110 mg/dL — ABNORMAL HIGH (ref 70–99)
Glucose-Capillary: 146 mg/dL — ABNORMAL HIGH (ref 70–99)
Glucose-Capillary: 116 mg/dL — ABNORMAL HIGH (ref 70–99)
Glucose-Capillary: 114 mg/dL — ABNORMAL HIGH (ref 70–99)

## 2018-09-17 NOTE — Progress Notes (Addendum)
[facility]/Trauma Progress Note  12 Days Post-Op   Assessment/Plan Acute ischemic pontine stroke with stent placement in basilar to left proximal PCA Respiratory failure/ hypoxemia due to inability to protect airway from cva s/p trach Hypertensive Crisis Type 2 diabetes  S/p PEG placement 10/21 S/p exploratory laparotomy with open gastrostomy tube and I&D 10/28 S/p takeback for repeat I&D abdominal wall + exlap 11/1 - Continue wet to dry dressings and TF   FEN: TF VTE: SCD's, ASA & Brilinta ID: none currently Follow up: Dr. Hulen Skains   LOS: 34 days    Subjective: CC: CVA  Pt states mild abdominal pain and headache.   Objective: Vital signs in last 24 hours: Temp:  [97.8 F (36.6 C)-98.8 F (37.1 C)] 97.8 F (36.6 C) (11/13 0320) Pulse Rate:  [72-98] 85 (11/13 0320) Resp:  [16-25] 19 (11/13 0320) BP: (65-159)/(42-84) 159/80 (11/13 0320) SpO2:  [93 %-97 %] 97 % (11/13 0326) FiO2 (%):  [25 %-28 %] 28 % (11/13 0326) Weight:  [128.4 kg] 128.4 kg (11/13 0500) Last BM Date: 09/13/18  Intake/Output from previous day: 11/12 0701 - 11/13 0700 In: 1137 [NG/GT:1137] Out: 700 [Urine:700] Intake/Output this shift: No intake/output data recorded.  PE: Gen:  Alert, NAD, pleasant, cooperative Pulm:  HTC Abd: minimal drainage around G tube site, wound see photo below, seen with Dr. Hulen Skains         Anti-infectives: Anti-infectives (From admission, onward)   Start     Dose/Rate Route Frequency Ordered Stop   09/08/18 1400  meropenem (MERREM) 1 g in sodium chloride 0.9 % 100 mL IVPB     1 g 200 mL/hr over 30 Minutes Intravenous Every 8 hours 09/08/18 1311 09/13/18 2241   09/04/18 1600  cefTRIAXone (ROCEPHIN) 2 g in sodium chloride 0.9 % 100 mL IVPB  Status:  Discontinued     2 g 200 mL/hr over 30 Minutes Intravenous Every 24 hours 09/04/18 1437 09/08/18 1311   09/04/18 1600  metroNIDAZOLE (FLAGYL) IVPB 500 mg  Status:  Discontinued     500 mg 100 mL/hr  over 60 Minutes Intravenous Every 8 hours 09/04/18 1437 09/08/18 1311   09/02/18 0600  vancomycin (VANCOCIN) 500 mg in sodium chloride 0.9 % 100 mL IVPB  Status:  Discontinued     500 mg 100 mL/hr over 60 Minutes Intravenous Every 12 hours 08/09/2018 1619 09/03/18 1025   08/30/18 1600  vancomycin (VANCOCIN) IVPB 750 mg/150 ml premix  Status:  Discontinued     750 mg 150 mL/hr over 60 Minutes Intravenous Every 12 hours 08/30/18 1334 08/14/2018 1619   08/29/18 2300  vancomycin (VANCOCIN) IVPB 750 mg/150 ml premix  Status:  Discontinued     750 mg 150 mL/hr over 60 Minutes Intravenous Every 12 hours 08/29/18 1223 08/29/18 1327   08/29/18 2300  vancomycin (VANCOCIN) IVPB 1000 mg/200 mL premix  Status:  Discontinued     1,000 mg 200 mL/hr over 60 Minutes Intravenous Every 12 hours 08/29/18 1327 08/30/18 1139   08/29/18 1800  cefTAZidime (FORTAZ) 2 g in sodium chloride 0.9 % 100 mL IVPB  Status:  Discontinued     2 g 200 mL/hr over 30 Minutes Intravenous Every 8 hours 08/29/18 1228 09/04/18 1437   08/26/18 2300  vancomycin (VANCOCIN) IVPB 1000 mg/200 mL premix  Status:  Discontinued     1,000 mg 200 mL/hr over 60 Minutes Intravenous Every 12 hours 08/26/18 0923 08/29/18 1223   08/26/18 1000  vancomycin (VANCOCIN) 2,500 mg in sodium chloride  0.9 % 500 mL IVPB     2,500 mg 250 mL/hr over 120 Minutes Intravenous  Once 08/26/18 0923 08/26/18 1900   08/26/18 1000  cefTAZidime (FORTAZ) 1 g in sodium chloride 0.9 % 100 mL IVPB  Status:  Discontinued     1 g 200 mL/hr over 30 Minutes Intravenous Every 8 hours 08/26/18 0923 08/29/18 1228   08/27/2018 1014  ceFAZolin (ANCEF) 2-4 GM/100ML-% IVPB    Note to Pharmacy:  Roma Kayser  : cabinet override      08/23/2018 1014 08/13/2018 2229      Lab Results:  Recent Labs    09/16/18 1013  WBC 10.0  HGB 9.9*  HCT 33.1*  PLT 544*   BMET Recent Labs    09/16/18 1013  NA 134*  K 4.5  CL 105  CO2 23  GLUCOSE 149*  BUN 23*  CREATININE 0.67    CALCIUM 10.1   PT/INR No results for input(s): LABPROT, INR in the last 72 hours. CMP     Component Value Date/Time   NA 134 (L) 09/16/2018 1013   K 4.5 09/16/2018 1013   CL 105 09/16/2018 1013   CO2 23 09/16/2018 1013   GLUCOSE 149 (H) 09/16/2018 1013   BUN 23 (H) 09/16/2018 1013   CREATININE 0.67 09/16/2018 1013   CREATININE 0.91 04/09/2016 1213   CALCIUM 10.1 09/16/2018 1013   PROT 7.8 01/23/2018 1352   ALBUMIN 1.3 (L) 09/06/2018 0456   AST 14 (L) 01/23/2018 1352   ALT 11 (L) 01/23/2018 1352   ALKPHOS 92 01/23/2018 1352   BILITOT 0.8 01/23/2018 1352   GFRNONAA >60 09/16/2018 1013   GFRNONAA 71 04/09/2016 1213   GFRAA >60 09/16/2018 1013   GFRAA 82 04/09/2016 1213   Lipase     Component Value Date/Time   LIPASE 41 01/23/2018 1352    Studies/Results: No results found.    Kalman Drape , [facility] 09/17/2018, 7:57 AM  Pager: [phone] Mon-Wed, Friday 7:00am-4:30pm Thurs 7am-11:30am  Consults: [phone]

## 2018-09-17 NOTE — Progress Notes (Signed)
STROKE TEAM PROGRESS NOTE   SUBJECTIVE (INTERVAL HISTORY) No new events.  She is on trach collar but appears to be stable. Neurologically unchanged awake following commands and moving left side purposefully. Stable dense right hemiplegia. Patient does not qualify for LTACH and snf search has been initiated.she is able to speak through her trach and voices agreement to go to sniff   OBJECTIVE Vitals:   09/17/18 0500 09/17/18 0801 09/17/18 1202 09/17/18 1302  BP:  (!) 152/80 (!) 141/76 (!) 141/76  Pulse:  79 85 78  Resp:  (!) 22 (!) 24 (!) 22  Temp:  98.5 F (36.9 C) 98.5 F (36.9 C)   TempSrc:  Oral Oral   SpO2:  96% 97%   Weight: 128.4 kg     Height:        CBC:  Recent Labs  Lab 09/13/18 0504 09/16/18 1013  WBC 10.2 10.0  HGB 10.2* 9.9*  HCT 34.7* 33.1*  MCV 87.4 86.0  PLT 543* 544*    Basic Metabolic Panel:  Recent Labs  Lab 09/13/18 0504 09/16/18 1013  NA 139 134*  K 4.5 4.5  CL 107 105  CO2 24 23  GLUCOSE 96 149*  BUN 29* 23*  CREATININE 0.68 0.67  CALCIUM 9.9 10.1    Lipid Panel:     Component Value Date/Time   CHOL 256 (H) 08/15/2018 0512   TRIG 248 (H) 08/15/2018 0512   HDL 35 (L) 08/15/2018 0512   CHOLHDL 7.3 08/15/2018 0512   VLDL 50 (H) 08/15/2018 0512   LDLCALC 171 (H) 08/15/2018 0512   HgbA1c:  Lab Results  Component Value Date   HGBA1C 11.4 (H) 08/15/2018   Urine Drug Screen:     Component Value Date/Time   LABOPIA NONE DETECTED 08/26/2018 1836   COCAINSCRNUR NONE DETECTED 09/03/2018 1836   LABBENZ NONE DETECTED 08/31/2018 1836   AMPHETMU NONE DETECTED 08/11/2018 1836   THCU NONE DETECTED 08/25/2018 1836   LABBARB NONE DETECTED 08/22/2018 1836    Alcohol Level No results found for: ETH  IMAGING  Ct Angio Head W Or Wo Contrast Ct Angio Neck W Or Wo Contrast 08/15/2018 IMPRESSION:  1. Interval basilar to left proximal PCA stenting. The density of the stent walls precludes detection of in stent stenosis; there is a degree of  wasting at the mid basilar segment. There is flow in the left PCA beyond the stent such that there is presumed stent patency.  2. Known lower pontine infarct. There are new small cerebellar infarcts since brain MRI yesterday.  3. Severe left P2 segment stenosis, also seen previously.  4. Moderate atheromatous narrowing in the proximal left MCA.    Ct Angio Head W Or Wo Contrast Ct Angio Neck W Or Wo Contrast 08/26/2018 IMPRESSION:  1. 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 No definite acute thrombus identified.  2. Severe stenosis left posterior cerebral artery and moderate stenosis right posterior cerebral artery.  3. Mild stenosis left MCA and moderate stenosis left MCA bifurcation.  4. No significant carotid or vertebral artery stenosis in the neck.    Ct Head Wo Contrast 08/21/2018 IMPRESSION:  No acute intracranial abnormalities. Mild white matter changes likely due to small vessel ischemia.    Mr Brain Wo Contrast 08/17/2018 IMPRESSION:  1. Acute nonhemorrhagic infarct involving the left paramedian brainstem. The infarct crosses midline.  2. Other periventricular and subcortical white matter disease is moderately advanced for age. This likely  reflects the sequela of chronic microvascular ischemia.  3. Tapering of the dens with prominent soft tissue pannus. 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    Ct Head Code Stroke Wo Contrast 08/20/2018  IMPRESSION:  1. No acute abnormality and no change from earlier today  2. ASPECTS is 10 3.    Cerebral Angiogram 08/27/2018 S/P 4 vessel cerebral arteriogram RT CFA approach. 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 S/P stent assisted angioplasty of distal basilar artery with patency of 80 to 90 %. 2.Approx 70 % stenosis of LT ICA supraclinoid  seg   MRI 08/15/18 1. Progressive acute pontine infarct. New patchy bilateral cerebellar infarction. New tiny left thalamic infarcts. 2. Preserved flow void in the stented basilar. 3. left facial/submandibular edema, please correlate with neck exam. Edited result: IMPRESSION: 1. Progressive acute pontine infarct. New patchy bilateral cerebellar infarction. New tiny left thalamic infarcts. 2. Preserved flow void in the stented basilar.  TTE - Left ventricle: The cavity size was normal. There was severe   concentric hypertrophy. Systolic function was normal. The   estimated ejection fraction was in the range of 60% to 65%. Wall   motion was normal; there were no regional wall motion   abnormalities. Doppler parameters are consistent with abnormal   left ventricular relaxation (grade 1 diastolic dysfunction). The   E/e&' ratio is <8, suggesting normal LV filling pressure. - Aortic valve: Trileaflet; mildly calcified leaflets.   Transvalvular velocity was minimally increased. There was no   regurgitation. Mean gradient (S): 12 mm Hg. - Mitral valve: Mildly thickened leaflets . There was trivial   regurgitation. - Left atrium: The atrium was normal in size. - Inferior vena cava: The vessel was dilated. The respirophasic   diameter changes were blunted (< 50%), consistent with elevated   central venous pressure. Impressions: - LVEF 60-65%, severe LVH, normal wall motion, grade 1 DD, normal   LV filling pressure, minimally increased aortic velocity without   signficant stenosis, trivial MR, normal LA size, dilated IVC.  CT abdomen and pelvis 09/03/18 Mild bilateral posterior basilar subsegmental atelectasis. Interval placement of new gastrostomy tube with tip and balloon within gastric lumen. Surgical drain is seen in the soft tissues in previous gastrostomy site. Small amount of gas and stranding is seen in the peritoneal fat anterior and inferior to the stomach which most likely  is related to gastrostomy placement. Probable surgical wound seen in right upper quadrant of the anterior abdominal wall. 8.2 x 2.3 cm gas and fluid collection is seen in the anterior abdominal wall which is unchanged compared to prior exam. Possible abscess cannot be excluded. Enlarged fibroid uterus.  PHYSICAL EXAM  Temp:  [97.8 F (36.6 C)-98.8 F (37.1 C)] 98.5 F (36.9 C) (11/13 1202) Pulse Rate:  [72-87] 78 (11/13 1302) Resp:  [16-25] 22 (11/13 1302) BP: (129-159)/(70-84) 141/76 (11/13 1302) SpO2:  [95 %-97 %] 97 % (11/13 1202) FiO2 (%):  [28 %] 28 % (11/13 1302) Weight:  [128.4 kg] 128.4 kg (11/13 0500)  General -  Obese middle-aged African-American lady, on trach collar, not in distress. She has abdominal surgical wound and drainage from the abdominal wall, dressing clean  Ophthalmologic - fundi not visualized due to noncooperation.  Cardiovascular - regular rate and rhythm  Neuro - post trach, on trach collar, eyes open, awake, alert. No ptosis or EOMI, denies diplopia.  PERRL.  Able to  wiggle left hand fingers and left foot toes only.  Follows commands and good eye contact.  Speech is mildly dysarthric.   Dense right hemiplegia persists.  ASSESSMENT/PLAN Ms. NYELLI SAMARA is a 58 y.o. female with history of difficult to control hypertension, insulin-dependent diabetes, obesity, hypothyroidism status post ablation presenting with chest discomfort, right-sided weakness, slurred speech and right facial droop. She did not receive IV t-PA due to late presentation. S/P stent assisted angioplasty of distal basilar artery.  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 Worsening symptoms with extension of pontine/medullary infarcts with new small bilateral cerebellar infarcts without evidence of stent re-stenosis or occlusion  Resultant quadriplegia, on trach and peg  CT head - No acute intracranial abnormalities.  CTA H&N 08/07/2018  - Extensive atherosclerotic disease in the basilar with focal critical stenosis in the mid to distal basilar. No definite acute thrombus identified. Severe stenosis left posterior cerebral artery  MRI head - Acute nonhemorrhagic infarct involving the left paramedian brainstem.   CTA H&N 08/15/2018 - stent too dense to see BA lumen, but distal flow preserved. New small cerebellar infarcts since brain MRI yesterday.  Severe left P2 segment stenosis, also seen previously.   Repeat MRI - extension of b/l pontine and upper medullary infarcts with new b/l cerebellar infarcts.  2D Echo - EF 60-65%  LDL - 174  HgbA1c - 11.1  VTE prophylaxis - heparin subq  aspirin 81 mg daily prior to admission, now resumed on Brilinta 90 mg bid and ASA 81 mg daily.   Patient counseled to be compliant with her antithrombotic medications  Ongoing aggressive stroke risk factor management  Therapy recommendations:  LTACH Disposition:  SNF Intracranial stenosis  BA mid to distal severe stenosis s/p BA stenting  Left PCA severe stenosis, R PCA moderate stenosis  Left MCA moderate stenosis  Uncontrolled stroke risk factors with HLD, HTN, DM  Non compliance with meds at home  Respiratory failure  S/p trach 08/28/2018  On trach collar now  Working with speech therapist for speaking valve  CCM on board  Hypertension  Stable but fluctuate with position   BP goal 130-150   On po norvasc, Coreg 25 bid, and hydralazine 25mg  tid  Hyperlipidemia  Lipid lowering medication PTA:  Lipitor 20 mg daily  LDL 174, goal < 70  Increased to 80 mg daily  Continue statin at discharge  Diabetes  HgbA1c 11.4, goal < 7.0  Uncontrolled at home  Glucose stable on insulin   Decrease Lantus again today to 18U bid   Decrease basal NovoLog again today to 4U Q4h  SSI  CBG monitoring  Low grade fever with leukocytosis  Tmax 101.6 -> afebrile  Leukocytosis - 14.0->12.1->9.9->11.2->11.5->11.3  UA  WBC 21-50  CXR unremarkable, improving from prior  On meropenem now  Blood cultures negative 10/26 ;  Respiratory cultures - mixed flora   Abdominal abscess culture showed proteus mirabilis  CCM and trauma on board  Hypernatremia and elevated Cre/BUN  Na 149->148->150->147->144->143  Cre 1.31->1.09->0.86->0.80->0.73->0.73  Continue 1/2NS @ 50  On TF @ 45  On free water 300mg  Q4h  Continue BMP monitoring  Dysphagia s/p PEG complicated by abdominal wall abscess  Continue tube feeding @ 45 cc/h  PEG done 08/11/2018  Removed and replaced dislodged PEG 08/06/2018  CT showed Abdominal wall abscess - s/p open surgical drainage 09/04/2018 persistent foul odor discharge. Reexploration 09/10/2018  Culture showed peroteus mirabilis  On meropenum now   Trauma  surgery following, appreciate recs  Anemia   Hb 6.8->PRBC->7.1->6.8->8.2->8.4->8.9->8.6->9.0  Could be due to iron deficiency and acute uterine bleeding and recent surgeries  Iron panel showed iron deficiency  Put on iron pills  PRBC transfusion 1 U 09/08/2018 and 2U 09/07/18  CBC monitoring  Abnormal uterine bleeding, improved  Has been following with GYN  Was on megace in the past  CT abd/pelvis 09/03/18 - enlarged fibroid uterus  resumed megace 80mg  bid  Continue ASA and brilinta for now  Close CBC monitoring  Other Stroke Risk Factors  Obesity, Body mass index is 48.59 kg/m., recommend weight loss, diet and exercise as appropriate   Other Active Problems  Hypokalemia - on supplement, now resolved  Thrombocytosis - Plts - 489 -> 553-> 535->479->492->497->484->502 likely due to anemia  Hospital day # 34  This patient is critically ill due to worsening brainstem infarct, BA stenosis s/p stenting, hyperglycemia, hypertensive, fever, anemia, abdominal wall abscess s/p drainage, dislodged PEG and hypernatremiaand at significant risk of neurological worsening, death form recurrent stroke, hemorrhagic  conversion, CHF, seizure, aspiration, hemorrhagic shock. This patient's care requires constant monitoring of vital signs, hemodynamics, respiratory and cardiac monitoring, review of multiple databases, neurological assessment, discussion with family, other specialists and medical decision making of high complexity. I spent 30 minutes of neurocritical care time in the care of this patient.    Acute pontine and bilateral cerebellar ischemic infarcts secondary to Basilar artery and bilateral PCA stenosis s/p Basilar artery stenting.  Continue dual antiplatelet therapy with risk factor modification.  CO2 stepdown unit as per CCM. Hopefully transfer to [facility] when stable from abdominal surgical wound standpoint later this week. No family available at the bedside for discussion.   Antony Contras, MD  To contact Stroke Continuity provider, please refer to [email or web address] After hours, contact General Neurology

## 2018-09-17 NOTE — Progress Notes (Signed)
  Speech Language Pathology Treatment: Dysphagia;[place] Speaking valve  Patient Details Name: Andrea [name] MRN: [id number] DOB: 1961/08/28 Today's Date: 09/17/2018 Time: 1210-1238 SLP Time Calculation (min) (ACUTE ONLY): 28 min  Assessment / Plan / Recommendation Clinical Impression  Pt quite emotional today, expressing sadness about being in the hospital and making slower progress than she would  prefer. Provided encouragement.  Pt repositioned; oral care provided.  She accepted only two boluses of pudding due to self-reports of feeling the sensation of choking - PO trials ceased.  Pt with RR 24; Sp02 and HR stable; however, appeared to have increased WOB throughout session (and prior to PO trials).  PMV placed for 15 minutes - pt achieved good vocal quality, but length of utterance shorter today (3-4 words/breath) requiring min verbal cues to take deeper breath and sustain exhalation.  Anxiety and intermittent crying interfering - cued for deep, slow breaths and ongoing support provided.  PMV removed/cleaned/placed in cup.  Notified RN re: above information.     HPI HPI: 57 yo presented to ED with chest pain noted Rt hemiparesis in ED with acute Left paramedian brainstem infarct. Intubated 10/10 for angioplasty, extubated post procedure and reintubated. Repeat MRI with brainstem infarct extension and bil cerebellar infarcts. Peg/trach 08/08/2018.PMHx: HTN, DM, CKD      SLP Plan  Continue with current plan of care       Recommendations  Diet recommendations: NPO      Patient may use Passy-Muir Speech Valve: During all therapies with supervision;Intermittently with supervision PMSV Supervision: Full         Oral Care Recommendations: Oral care QID SLP Visit Diagnosis: Dysphagia, oropharyngeal phase (R13.12);Aphonia (R49.1) Plan: Continue with current plan of care       GO                Assunta Curtis 09/17/2018, 12:40 PM

## 2018-09-17 NOTE — Plan of Care (Signed)
  Problem: Education: Goal: Knowledge of General Education information will improve Description Including pain rating scale, medication(s)/side effects and non-pharmacologic comfort measures Outcome: Progressing   Problem: Health Behavior/Discharge Planning: Goal: Ability to manage health-related needs will improve Outcome: Progressing   Problem: Clinical Measurements: Goal: Ability to maintain clinical measurements within normal limits will improve Outcome: Progressing Goal: Will remain free from infection Outcome: Progressing Goal: Diagnostic test results will improve Outcome: Progressing Goal: Respiratory complications will improve Outcome: Progressing Goal: Cardiovascular complication will be avoided Outcome: Progressing   Problem: Activity: Goal: Risk for activity intolerance will decrease Outcome: Progressing   Problem: Nutrition: Goal: Adequate nutrition will be maintained Outcome: Progressing   Problem: Coping: Goal: Level of anxiety will decrease Outcome: Progressing   Problem: Elimination: Goal: Will not experience complications related to bowel motility Outcome: Progressing Goal: Will not experience complications related to urinary retention Outcome: Progressing   Problem: Pain Managment: Goal: General experience of comfort will improve Outcome: Progressing   Problem: Safety: Goal: Ability to remain free from injury will improve Outcome: Progressing   Problem: Skin Integrity: Goal: Risk for impaired skin integrity will decrease Outcome: Progressing   Problem: Education: Goal: Understanding of cardiac disease, CV risk reduction, and recovery process will improve Outcome: Progressing Goal: Individualized Educational Video(s) Outcome: Progressing   Problem: Activity: Goal: Ability to tolerate increased activity will improve Outcome: Progressing   Problem: Cardiac: Goal: Ability to achieve and maintain adequate cardiovascular perfusion will  improve Outcome: Progressing   Problem: Health Behavior/Discharge Planning: Goal: Ability to safely manage health-related needs after discharge will improve Outcome: Progressing   Problem: Education: Goal: Knowledge of disease or condition will improve Outcome: Progressing Goal: Knowledge of secondary prevention will improve Outcome: Progressing Goal: Knowledge of patient specific risk factors addressed and post discharge goals established will improve Outcome: Progressing Goal: Individualized Educational Video(s) Outcome: Progressing   Problem: Coping: Goal: Will verbalize positive feelings about self Outcome: Progressing Goal: Will identify appropriate support needs Outcome: Progressing   Problem: Health Behavior/Discharge Planning: Goal: Ability to manage health-related needs will improve Outcome: Progressing   Problem: Self-Care: Goal: Ability to participate in self-care as condition permits will improve Outcome: Progressing Goal: Verbalization of feelings and concerns over difficulty with self-care will improve Outcome: Progressing Goal: Ability to communicate needs accurately will improve Outcome: Progressing   Problem: Nutrition: Goal: Risk of aspiration will decrease Outcome: Progressing Goal: Dietary intake will improve Outcome: Progressing   Problem: Ischemic Stroke/TIA Tissue Perfusion: Goal: Complications of ischemic stroke/TIA will be minimized Outcome: Progressing   Problem: Spiritual Needs Goal: Ability to function at adequate level Outcome: Progressing

## 2018-09-18 ENCOUNTER — Inpatient Hospital Stay (HOSPITAL_COMMUNITY): Payer: Medicaid Other

## 2018-09-18 DIAGNOSIS — M7989 Other specified soft tissue disorders: Secondary | ICD-10-CM

## 2018-09-18 DIAGNOSIS — R52 Pain, unspecified: Secondary | ICD-10-CM

## 2018-09-18 DIAGNOSIS — R0602 Shortness of breath: Secondary | ICD-10-CM

## 2018-09-18 LAB — GLUCOSE, CAPILLARY
GLUCOSE-CAPILLARY: 90 mg/dL (ref 70–99)
Glucose-Capillary: 99 mg/dL (ref 70–99)
Glucose-Capillary: 126 mg/dL — ABNORMAL HIGH (ref 70–99)
Glucose-Capillary: 95 mg/dL (ref 70–99)
Glucose-Capillary: 96 mg/dL (ref 70–99)

## 2018-09-18 MED ORDER — IPRATROPIUM-ALBUTEROL 0.5-2.5 (3) MG/3ML IN SOLN
3.0000 mL | Freq: Three times a day (TID) | RESPIRATORY_TRACT | Status: DC
  Administered 2018-09-18 – 2018-09-26 (×22): 3 mL via RESPIRATORY_TRACT
  Filled 2018-09-18 (×22): qty 3

## 2018-09-18 MED ORDER — IPRATROPIUM-ALBUTEROL 0.5-2.5 (3) MG/3ML IN SOLN: 3.0000 mL | Freq: Three times a day (TID) | RESPIRATORY_TRACT | Status: DC

## 2018-09-18 NOTE — Progress Notes (Signed)
Physical Therapy Treatment Patient Details Name: Andrea [name] MRN: [id number] DOB: 05-Nov-1961 Today's Date: 09/18/2018    History of Present Illness 57 yo presented to ED with chest pain noted Rt hemiparesis in ED with acute Left paramedian brainstem infarct. Intubated 10/10 for angioplasty, extubated post procedure and reintubated. Repeat MRI with brainstem infarct extension and bil cerebellar infarcts. Peg/trach with return to vent 08/15/2018. 10/22-29 on trach collar, return to vent 10/29. 10/28 I & D of abdominal wall abscess with placement of penrose drain and G-tube, 11/1 repeat ex lap. 11/7 return to trach collar.  PMHx: HTN, DM, CKD    PT Comments    Andrea [name] reports not feeling her best today and not up to full standing. Pt willing to attempt tilt and tolerated grossly 6 min at 50kg (degree function not working). Pt able to perform limited movement of LLE with all exercises and with hip, quad and dorsiflexion activation in sitting and standing. No noted movement of RLE. Pt in chair position in bed end of session and encouraged continued tilt with nursing as well as pt performing muscle activation throughout the day. Pt talking throughout session with PMSV and at times around trach without valve  BP supine 142/77, 40kg on foot board 126/79, 50kg 142/90    Follow Up Recommendations  SNF;Supervision/Assistance - 24 hour     Equipment Recommendations  Hospital bed;Wheelchair (measurements PT);Other (comment)    Recommendations for Other Services       Precautions / Restrictions Precautions Precautions: Fall Precaution Comments: Peg/trach, abdominal binder at all times    Mobility  Bed Mobility Overal bed mobility: Needs Assistance Bed Mobility: Supine to Sit     Supine to sit: Total assist;+2 for physical assistance     General bed mobility comments: used foot board to slide pt toward [facility] with 2 person total assist, pt positioned from supine to chair position with bed  function  Transfers Overall transfer level: Needs assistance   Transfers: Sit to/from Stand Sit to Stand: Total assist;+2 safety/equipment         General transfer comment: total assist with use of tilt bed to transition to standing 6 min at 50kg weight on foot board, grossly 60%, with elevation to 80 degrees pt knees buckling, sliding and pt stating intolerance with return to supine. Pt performing left quad contraction in tilt  Ambulation/Gait             General Gait Details: unable   Stairs             Wheelchair Mobility    Modified Rankin (Stroke Patients Only)       Balance Overall balance assessment: Needs assistance   Sitting balance-Leahy Scale: Zero       Standing balance-Leahy Scale: Zero Standing balance comment: utilized tilt bed and straps                            Cognition Arousal/Alertness: Awake/alert Behavior During Therapy: WFL for tasks assessed/performed Overall Cognitive Status: Impaired/Different from baseline Area of Impairment: Attention;Safety/judgement;Awareness;Orientation                 Orientation Level: Disoriented to;Time Current Attention Level: Selective   Following Commands: Follows one step commands consistently Safety/Judgement: Decreased awareness of safety;Decreased awareness of deficits   Problem Solving: Slow processing General Comments: pt reports not feeling well and up to therapy as much today, aware of situation and responding slowly to commands for RLE  movement      Exercises General Exercises - Lower Extremity Ankle Circles/Pumps: AAROM;10 reps;Seated;Left Quad Sets: AROM;10 reps;Seated;Standing;Left(10 quad sets in standing, 10 assisted SAQ in sitting) Heel Slides: AAROM;10 reps;Seated;Left Hip ABduction/ADduction: AAROM;10 reps;Seated;Left    General Comments        Pertinent Vitals/Pain Pain Score: 5  Pain Location: RLE Pain Descriptors / Indicators: Aching;Sore Pain  Intervention(s): Limited activity within patient's tolerance;Monitored during session;Patient requesting pain meds-RN notified    Home Living                      Prior Function            PT Goals (current goals can now be found in the care plan section) Acute Rehab PT Goals Time For Goal Achievement: 10/02/18 Potential to Achieve Goals: Fair Progress towards PT goals: Progressing toward goals(goals appropriate with date extended and tilt goal added)    Frequency    Min 2X/week      PT Plan Discharge plan needs to be updated    Co-evaluation              AM-PAC PT "6 Clicks" Daily Activity  Outcome Measure  Difficulty turning over in bed (including adjusting bedclothes, sheets and blankets)?: Unable Difficulty moving from lying on back to sitting on the side of the bed? : Unable Difficulty sitting down on and standing up from a chair with arms (e.g., wheelchair, bedside commode, etc,.)?: Unable Help needed moving to and from a bed to chair (including a wheelchair)?: Total Help needed walking in hospital room?: Total Help needed climbing 3-5 steps with a railing? : Total 6 Click Score: 6    End of Session Equipment Utilized During Treatment: Oxygen;Other (comment)(tilt bed) Activity Tolerance: Patient tolerated treatment well Patient left: in bed;with call bell/phone within reach(informed staff of need for soft touch call button) Nurse Communication: Mobility status;Need for lift equipment PT Visit Diagnosis: Other abnormalities of gait and mobility (R26.89);Muscle weakness (generalized) (M62.81);Other symptoms and signs involving the nervous system (R29.898);Hemiplegia and hemiparesis Hemiplegia - caused by: Cerebral infarction     Time: 9794-8016 PT Time Calculation (min) (ACUTE ONLY): 32 min  Charges:  $Therapeutic Exercise: 8-22 mins $Therapeutic Activity: 8-22 mins                     Andrea [name], PT Acute Rehabilitation  Services Pager: [phone] Office: [phone]    Andrea [name] Andrea [name] Andrea [name] 09/18/2018, 9:32 AM

## 2018-09-18 NOTE — Progress Notes (Signed)
STROKE TEAM PROGRESS NOTE   SUBJECTIVE (INTERVAL HISTORY) No new events.  She is on trach collar but appears to be stable. Neurologically unchanged awake following commands and moving left side purposefully. Stable dense right hemiplegia. She is complaining of leg pain in the calf. There is bilateral lower extremity swelling. Lower extremity  venous Dopplers have been ordered.   OBJECTIVE Vitals:   09/18/18 1126 09/18/18 1156 09/18/18 1527 09/18/18 1612  BP:  133/63  137/75  Pulse: 65 70 82 77  Resp: (!) 21 (!) 23 (!) 26 (!) 22  Temp:  97.9 F (36.6 C)  98.5 F (36.9 C)  TempSrc:  Oral  Oral  SpO2: 98% 96% 99% 100%  Weight:      Height:        CBC:  Recent Labs  Lab 09/13/18 0504 09/16/18 1013  WBC 10.2 10.0  HGB 10.2* 9.9*  HCT 34.7* 33.1*  MCV 87.4 86.0  PLT 543* 544*    Basic Metabolic Panel:  Recent Labs  Lab 09/13/18 0504 09/16/18 1013  NA 139 134*  K 4.5 4.5  CL 107 105  CO2 24 23  GLUCOSE 96 149*  BUN 29* 23*  CREATININE 0.68 0.67  CALCIUM 9.9 10.1    Lipid Panel:     Component Value Date/Time   CHOL 256 (H) 08/15/2018 0512   TRIG 248 (H) 08/15/2018 0512   HDL 35 (L) 08/15/2018 0512   CHOLHDL 7.3 08/15/2018 0512   VLDL 50 (H) 08/15/2018 0512   LDLCALC 171 (H) 08/15/2018 0512   HgbA1c:  Lab Results  Component Value Date   HGBA1C 11.4 (H) 08/15/2018   Urine Drug Screen:     Component Value Date/Time   LABOPIA NONE DETECTED 08/20/2018 1836   COCAINSCRNUR NONE DETECTED 08/08/2018 1836   LABBENZ NONE DETECTED 08/29/2018 1836   AMPHETMU NONE DETECTED 08/20/2018 1836   THCU NONE DETECTED 09/01/2018 1836   LABBARB NONE DETECTED 09/02/2018 1836    Alcohol Level No results found for: ETH  IMAGING  Ct Angio Head W Or Wo Contrast Ct Angio Neck W Or Wo Contrast 08/15/2018 IMPRESSION:  1. Interval basilar to left proximal PCA stenting. The density of the stent walls precludes detection of in stent stenosis; there is a degree of wasting at the  mid basilar segment. There is flow in the left PCA beyond the stent such that there is presumed stent patency.  2. Known lower pontine infarct. There are new small cerebellar infarcts since brain MRI yesterday.  3. Severe left P2 segment stenosis, also seen previously.  4. Moderate atheromatous narrowing in the proximal left MCA.    Ct Angio Head W Or Wo Contrast Ct Angio Neck W Or Wo Contrast 09/04/2018 IMPRESSION:  1. 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 No definite acute thrombus identified.  2. Severe stenosis left posterior cerebral artery and moderate stenosis right posterior cerebral artery.  3. Mild stenosis left MCA and moderate stenosis left MCA bifurcation.  4. No significant carotid or vertebral artery stenosis in the neck.    Ct Head Wo Contrast 08/23/2018 IMPRESSION:  No acute intracranial abnormalities. Mild white matter changes likely due to small vessel ischemia.    Mr Brain Wo Contrast 08/18/2018 IMPRESSION:  1. Acute nonhemorrhagic infarct involving the left paramedian brainstem. The infarct crosses midline.  2. Other periventricular and subcortical white matter disease is moderately advanced for age. This likely reflects the sequela of chronic microvascular  ischemia.  3. Tapering of the dens with prominent soft tissue pannus. 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    Ct Head Code Stroke Wo Contrast 08/24/2018  IMPRESSION:  1. No acute abnormality and no change from earlier today  2. ASPECTS is 10 3.    Cerebral Angiogram 08/29/2018 S/P 4 vessel cerebral arteriogram RT CFA approach. 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 S/P stent assisted angioplasty of distal basilar artery with patency of 80 to 90 %. 2.Approx 70 % stenosis of LT ICA supraclinoid seg   MRI 08/15/18 1.  Progressive acute pontine infarct. New patchy bilateral cerebellar infarction. New tiny left thalamic infarcts. 2. Preserved flow void in the stented basilar. 3. left facial/submandibular edema, please correlate with neck exam. Edited result: IMPRESSION: 1. Progressive acute pontine infarct. New patchy bilateral cerebellar infarction. New tiny left thalamic infarcts. 2. Preserved flow void in the stented basilar.  TTE - Left ventricle: The cavity size was normal. There was severe   concentric hypertrophy. Systolic function was normal. The   estimated ejection fraction was in the range of 60% to 65%. Wall   motion was normal; there were no regional wall motion   abnormalities. Doppler parameters are consistent with abnormal   left ventricular relaxation (grade 1 diastolic dysfunction). The   E/e&' ratio is <8, suggesting normal LV filling pressure. - Aortic valve: Trileaflet; mildly calcified leaflets.   Transvalvular velocity was minimally increased. There was no   regurgitation. Mean gradient (S): 12 mm Hg. - Mitral valve: Mildly thickened leaflets . There was trivial   regurgitation. - Left atrium: The atrium was normal in size. - Inferior vena cava: The vessel was dilated. The respirophasic   diameter changes were blunted (< 50%), consistent with elevated   central venous pressure. Impressions: - LVEF 60-65%, severe LVH, normal wall motion, grade 1 DD, normal   LV filling pressure, minimally increased aortic velocity without   signficant stenosis, trivial MR, normal LA size, dilated IVC.  CT abdomen and pelvis 09/03/18 Mild bilateral posterior basilar subsegmental atelectasis. Interval placement of new gastrostomy tube with tip and balloon within gastric lumen. Surgical drain is seen in the soft tissues in previous gastrostomy site. Small amount of gas and stranding is seen in the peritoneal fat anterior and inferior to the stomach which most likely is related to gastrostomy  placement. Probable surgical wound seen in right upper quadrant of the anterior abdominal wall. 8.2 x 2.3 cm gas and fluid collection is seen in the anterior abdominal wall which is unchanged compared to prior exam. Possible abscess cannot be excluded. Enlarged fibroid uterus.  PHYSICAL EXAM  Temp:  [97.8 F (36.6 C)-98.5 F (36.9 C)] 98.5 F (36.9 C) (11/14 1612) Pulse Rate:  [65-83] 77 (11/14 1612) Resp:  [16-26] 22 (11/14 1612) BP: (125-149)/(62-90) 137/75 (11/14 1612) SpO2:  [95 %-100 %] 100 % (11/14 1612) FiO2 (%):  [28 %] 28 % (11/14 1527) Weight:  [76 kg] 76 kg (11/14 0500)  General -  Obese middle-aged African-American lady, on trach collar, not in distress. She has abdominal surgical wound and drainage from the abdominal wall, dressing clean  Ophthalmologic - fundi not visualized due to noncooperation.  Cardiovascular - regular rate and rhythm  Neuro - post trach, on trach collar, eyes open, awake, alert. No ptosis or EOMI, denies diplopia.  PERRL.  Able to wiggle left hand fingers and left  foot toes only.  Follows commands and good eye contact.  Speech is mildly dysarthric.   Dense right hemiplegia persists.  ASSESSMENT/PLAN Ms. AHRIA SLAPPEY is a 57 y.o. female with history of difficult to control hypertension, insulin-dependent diabetes, obesity, hypothyroidism status post ablation presenting with chest discomfort, right-sided weakness, slurred speech and right facial droop. She did not receive IV t-PA due to late presentation. S/P stent assisted angioplasty of distal basilar artery.  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 Worsening symptoms with extension of pontine/medullary infarcts with new small bilateral cerebellar infarcts without evidence of stent re-stenosis or occlusion  Resultant quadriplegia, on trach and peg  CT head - No acute intracranial abnormalities.  CTA H&N 08/31/2018 - Extensive atherosclerotic  disease in the basilar with focal critical stenosis in the mid to distal basilar. No definite acute thrombus identified. Severe stenosis left posterior cerebral artery  MRI head - Acute nonhemorrhagic infarct involving the left paramedian brainstem.   CTA H&N 08/15/2018 - stent too dense to see BA lumen, but distal flow preserved. New small cerebellar infarcts since brain MRI yesterday.  Severe left P2 segment stenosis, also seen previously.   Repeat MRI - extension of b/l pontine and upper medullary infarcts with new b/l cerebellar infarcts.  2D Echo - EF 60-65%  LDL - 174  HgbA1c - 11.1  VTE prophylaxis - heparin subq  aspirin 81 mg daily prior to admission, now resumed on Brilinta 90 mg bid and ASA 81 mg daily.   Patient counseled to be compliant with her antithrombotic medications  Ongoing aggressive stroke risk factor management  Therapy recommendations:  LTACH Disposition:  SNF Intracranial stenosis  BA mid to distal severe stenosis s/p BA stenting  Left PCA severe stenosis, R PCA moderate stenosis  Left MCA moderate stenosis  Uncontrolled stroke risk factors with HLD, HTN, DM  Non compliance with meds at home  Respiratory failure  S/p trach 08/31/2018  On trach collar now  Working with speech therapist for speaking valve  CCM on board  Hypertension  Stable but fluctuate with position   BP goal 130-150   On po norvasc, Coreg 25 bid, and hydralazine 25mg  tid  Hyperlipidemia  Lipid lowering medication PTA:  Lipitor 20 mg daily  LDL 174, goal < 70  Increased to 80 mg daily  Continue statin at discharge  Diabetes  HgbA1c 11.4, goal < 7.0  Uncontrolled at home  Glucose stable on insulin   Decrease Lantus again today to 18U bid   Decrease basal NovoLog again today to 4U Q4h  SSI  CBG monitoring  Low grade fever with leukocytosis  Tmax 101.6 -> afebrile  Leukocytosis - 14.0->12.1->9.9->11.2->11.5->11.3  UA WBC 21-50  CXR  unremarkable, improving from prior  On meropenem now  Blood cultures negative 10/26 ;  Respiratory cultures - mixed flora   Abdominal abscess culture showed proteus mirabilis  CCM and trauma on board  Hypernatremia and elevated Cre/BUN  Na 149->148->150->147->144->143  Cre 1.31->1.09->0.86->0.80->0.73->0.73  Continue 1/2NS @ 50  On TF @ 45  On free water 300mg  Q4h  Continue BMP monitoring  Dysphagia s/p PEG complicated by abdominal wall abscess  Continue tube feeding @ 45 cc/h  PEG done 09/03/2018  Removed and replaced dislodged PEG 08/05/2018  CT showed Abdominal wall abscess - s/p open surgical drainage 08/18/2018 persistent foul odor discharge. Reexploration 10/04/2018  Culture showed peroteus mirabilis  On meropenum now   Trauma surgery following, appreciate recs  Anemia  Hb 6.8->PRBC->7.1->6.8->8.2->8.4->8.9->8.6->9.0  Could be due to iron deficiency and acute uterine bleeding and recent surgeries  Iron panel showed iron deficiency  Put on iron pills  PRBC transfusion 1 U 09/16/2018 and 2U 09/07/18  CBC monitoring  Abnormal uterine bleeding, improved  Has been following with GYN  Was on megace in the past  CT abd/pelvis 09/03/18 - enlarged fibroid uterus  resumed megace 80mg  bid  Continue ASA and brilinta for now  Close CBC monitoring  Other Stroke Risk Factors  Obesity, Body mass index is 28.76 kg/m., recommend weight loss, diet and exercise as appropriate   Other Active Problems  Hypokalemia - on supplement, now resolved  Thrombocytosis - Plts - 489 -> 553-> 535->479->492->497->484->502 likely due to anemia  Hospital day # 35 Plan check lower extremity venous Dopplers for DVT. Await transfer to skilled nursing facility when bed available This patient is critically ill due to worsening brainstem infarct, BA stenosis s/p stenting, hyperglycemia, hypertensive, fever, anemia, abdominal wall abscess s/p drainage, dislodged PEG and  hypernatremiaand at significant risk of neurological worsening, death form recurrent stroke, hemorrhagic conversion, CHF, seizure, aspiration, hemorrhagic shock. This patient's care requires constant monitoring of vital signs, hemodynamics, respiratory and cardiac monitoring, review of multiple databases, neurological assessment, discussion with family, other specialists and medical decision making of high complexity. I spent 30 minutes of neurocritical care time in the care of this patient.        Antony Contras, MD  To contact Stroke Continuity provider, please refer to [email or web address] After hours, contact General Neurology

## 2018-09-18 NOTE — Progress Notes (Signed)
[facility]/Trauma Progress Note  13 Days Post-Op   Assessment/Plan Acute ischemic pontine stroke with stent placement in basilar to left proximal PCA Respiratory failure/ hypoxemia due to inability to protect airway from cvas/p trach Hypertensive Crisis Type 2 diabetes  S/p PEG placement 10/21 S/p exploratory laparotomy with open gastrostomy tube and I&D 10/28 S/p takeback for repeat I&D abdominal wall + exlap 11/1 - Continue wet to dry dressings and TF  - continue abdominal binder  FEN:TF VTE: SCD's,ASA & Brilinta LO:VFIE currently Follow up:Dr. Hulen Skains   LOS: 35 days    Subjective: CC: CVA  No complaints.   Objective: Vital signs in last 24 hours: Temp:  [97.8 F (36.6 C)-99.5 F (37.5 C)] 98.2 F (36.8 C) (11/14 0732) Pulse Rate:  [71-85] 71 (11/14 0755) Resp:  [16-24] 24 (11/14 0755) BP: (125-149)/(62-90) 142/90 (11/14 0925) SpO2:  [95 %-100 %] 99 % (11/14 0925) FiO2 (%):  [28 %] 28 % (11/14 0925) Weight:  [76 kg] 76 kg (11/14 0500) Last BM Date: 09/13/18  Intake/Output from previous day: 11/13 0701 - 11/14 0700 In: -  Out: 2950 [Urine:2950] Intake/Output this shift: No intake/output data recorded.  PE: Gen:  Alert, NAD, pleasant, cooperative Pulm:  HTC Abd: minimal drainage around G tube site, large incision looks well with some fascial separation but is clean and stable appearing, smaller wounds are clean and are without signs of infection.   Anti-infectives: Anti-infectives (From admission, onward)   Start     Dose/Rate Route Frequency Ordered Stop   09/08/18 1400  meropenem (MERREM) 1 g in sodium chloride 0.9 % 100 mL IVPB     1 g 200 mL/hr over 30 Minutes Intravenous Every 8 hours 09/08/18 1311 09/13/18 2241   09/04/18 1600  cefTRIAXone (ROCEPHIN) 2 g in sodium chloride 0.9 % 100 mL IVPB  Status:  Discontinued     2 g 200 mL/hr over 30 Minutes Intravenous Every 24 hours 09/04/18 1437 09/08/18 1311   09/04/18 1600   metroNIDAZOLE (FLAGYL) IVPB 500 mg  Status:  Discontinued     500 mg 100 mL/hr over 60 Minutes Intravenous Every 8 hours 09/04/18 1437 09/08/18 1311   09/02/18 0600  vancomycin (VANCOCIN) 500 mg in sodium chloride 0.9 % 100 mL IVPB  Status:  Discontinued     500 mg 100 mL/hr over 60 Minutes Intravenous Every 12 hours 08/12/2018 1619 09/03/18 1025   08/30/18 1600  vancomycin (VANCOCIN) IVPB 750 mg/150 ml premix  Status:  Discontinued     750 mg 150 mL/hr over 60 Minutes Intravenous Every 12 hours 08/30/18 1334 09/02/2018 1619   08/29/18 2300  vancomycin (VANCOCIN) IVPB 750 mg/150 ml premix  Status:  Discontinued     750 mg 150 mL/hr over 60 Minutes Intravenous Every 12 hours 08/29/18 1223 08/29/18 1327   08/29/18 2300  vancomycin (VANCOCIN) IVPB 1000 mg/200 mL premix  Status:  Discontinued     1,000 mg 200 mL/hr over 60 Minutes Intravenous Every 12 hours 08/29/18 1327 08/30/18 1139   08/29/18 1800  cefTAZidime (FORTAZ) 2 g in sodium chloride 0.9 % 100 mL IVPB  Status:  Discontinued     2 g 200 mL/hr over 30 Minutes Intravenous Every 8 hours 08/29/18 1228 09/04/18 1437   08/26/18 2300  vancomycin (VANCOCIN) IVPB 1000 mg/200 mL premix  Status:  Discontinued     1,000 mg 200 mL/hr over 60 Minutes Intravenous Every 12 hours 08/26/18 0923 08/29/18 1223   08/26/18 1000  vancomycin (VANCOCIN) 2,500 mg  in sodium chloride 0.9 % 500 mL IVPB     2,500 mg 250 mL/hr over 120 Minutes Intravenous  Once 08/26/18 0923 08/26/18 1900   08/26/18 1000  cefTAZidime (FORTAZ) 1 g in sodium chloride 0.9 % 100 mL IVPB  Status:  Discontinued     1 g 200 mL/hr over 30 Minutes Intravenous Every 8 hours 08/26/18 0923 08/29/18 1228   08/13/2018 1014  ceFAZolin (ANCEF) 2-4 GM/100ML-% IVPB    Note to Pharmacy:  Roma Kayser  : cabinet override      08/15/2018 1014 08/13/2018 2229      Lab Results:  Recent Labs    09/16/18 1013  WBC 10.0  HGB 9.9*  HCT 33.1*  PLT 544*   BMET Recent Labs    09/16/18 1013  NA  134*  K 4.5  CL 105  CO2 23  GLUCOSE 149*  BUN 23*  CREATININE 0.67  CALCIUM 10.1   PT/INR No results for input(s): LABPROT, INR in the last 72 hours. CMP     Component Value Date/Time   NA 134 (L) 09/16/2018 1013   K 4.5 09/16/2018 1013   CL 105 09/16/2018 1013   CO2 23 09/16/2018 1013   GLUCOSE 149 (H) 09/16/2018 1013   BUN 23 (H) 09/16/2018 1013   CREATININE 0.67 09/16/2018 1013   CREATININE 0.91 04/09/2016 1213   CALCIUM 10.1 09/16/2018 1013   PROT 7.8 01/23/2018 1352   ALBUMIN 1.3 (L) 09/06/2018 0456   AST 14 (L) 01/23/2018 1352   ALT 11 (L) 01/23/2018 1352   ALKPHOS 92 01/23/2018 1352   BILITOT 0.8 01/23/2018 1352   GFRNONAA >60 09/16/2018 1013   GFRNONAA 71 04/09/2016 1213   GFRAA >60 09/16/2018 1013   GFRAA 82 04/09/2016 1213   Lipase     Component Value Date/Time   LIPASE 41 01/23/2018 1352    Studies/Results: No results found.    Kalman Drape , [facility] 09/18/2018, 9:52 AM  Pager: [phone] Mon-Wed, Friday 7:00am-4:30pm Thurs 7am-11:30am  Consults: [phone]

## 2018-09-18 NOTE — Progress Notes (Signed)
  Speech Language Pathology Treatment: Dysphagia;Passy Muir Speaking valve  Patient Details Name: Andrea [name] MRN: [id number] DOB: 09/13/1961 Today's Date: 09/18/2018 Time: 7793-9030 SLP Time Calculation (min) (ACUTE ONLY): 35 min  Assessment / Plan / Recommendation Clinical Impression  Visited pt after discussion with RT who reports pt has been having multiple dry mucous plugs a day. She feels this may have accounted for pt struggle with SLP last session. RT had just cleared out plug prior to SLP arrival and felt fairly confident airway was patent. Pt was able to wear PMSV continuously for over 20 minutes with no difficulty. Breath support still slightly decreased, but pt pacing speech and breathing successfully for intelligible conversation. Offered pt magic cup, she didn't like it and felt like it was stuck in her throat. Offered 1/4 teaspoons of water to aid in pharyngeal clearance, which was tolerated well. Pt verbalized more interest in ice and SLP offered 4 large pieces of ice. One cough occurred when pt started talking prior to completing swallow. Pt may have ice with full RN supervision with PMSV in place.   During conversation pt was a little down, she said "you cant be up all the time." She lit up when talking about dogs and wanting to see her dog. She wants her family to tell her about her dog. Tried to call family about this because the dog could visit [name], but no one answered. Also sent an email to the recreational therapist; maybe [name] could have a visit from a therapy dog. She also expressed interest in a chair so she could go outside. She wonders if her dog could visit her outside. Will discuss with therapy team.    HPI HPI: 57 yo presented to ED with chest pain noted Rt hemiparesis in ED with acute Left paramedian brainstem infarct. Intubated 10/10 for angioplasty, extubated post procedure and reintubated. Repeat MRI with brainstem infarct extension and bil cerebellar infarcts.  Peg/trach 08/18/2018.PMHx: HTN, DM, CKD      SLP Plan  Continue with current plan of care       Recommendations  Diet recommendations: NPO;Other(comment)(ice with RN ok)      Patient may use Passy-Muir Speech Valve: During all therapies with supervision;Intermittently with supervision PMSV Supervision: Full         Oral Care Recommendations: Oral care QID Follow up Recommendations: Skilled Nursing facility SLP Visit Diagnosis: Dysphagia, oropharyngeal phase (R13.12);Aphonia (R49.1) Plan: Continue with current plan of care       GO               Herbie [place], MA [place] Pager [phone] Office [phone]  Lynann Beaver 09/18/2018, 1:26 PM

## 2018-09-18 NOTE — Progress Notes (Signed)
Right lower extremity venous duplex completed. Preliminary results - Technically difficult due to body habitus. There is no obvious evidence of DVT. [place] Derald Lorge,RVS 09/18/2018, 4:05 PM

## 2018-09-18 NOTE — Progress Notes (Signed)
NAME:  Andrea [name], MRN:  [id number], DOB:  1960/11/08, LOS: 32 ADMISSION DATE:  08/30/2018, CONSULTATION DATE:  08/19/2018 REFERRING MD:  Dr. Rory Percy, CHIEF COMPLAINT:  CP/ Acute CVA  Brief History   34 yoF w/poorly controlled HTN and IDDM presenting with CP and left arm pain w/BP 220/117. Initial CTH neg.  Cardiac workup negative for acute event thus far, cardiology following.  Code stroke initiated for R hemiplegia, dysarthria and right facial droop.  Out of window for TPA.  Requiring cleviprex for BP control.  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  Evolving pontine stroke on MRI. Intubated 10/10 for neuro IR s/p stenting and angioplasty with 80-90% patency.  Extubated post procedure but due to insufficency placed on BiPAP and brought to ICU.  Required reintubation and ultimately trach (10/21 JY).  Peg tube placed 10/21 by CCS.  Taken to OR 10/28 for open gastrostomy and I&D of abdominal wall abscess. 11/1 taken back to OR for repeat ex-lap.     Past Medical History  HTN, IDDM, hypothyroidism s/p ablation  Significant Hospital Events   10/9 present to [facility] ED -transferred to [facility] 10/9 basilar artery stenting 10/9 intubated urgently 6h post procedure for inability to protect airway. 10/10 CTA for declining exam - evolving pontine infarct, stent deemed to be patent 10/14 Brillinta stopped and ASA/ heparin started to in anticipation of tracheostomy on Monday. 10/14 through 10/19: Neuro exam has been stable sodium increasing in spite of free water via tube, providing supportive care awaiting trach 10/20: Sodium down to 151 from 156 clinically looks the same 10/21: Sodium down to 148, sputum culture sent for purulent tracheal secretions, awaiting trach and PEG.  Purulent bronchial secretions from right upper lobe sent for BAL 10/22: Tracheostomy and PEG completed on the 21st tolerated well.  Spiking low-grade fever white cells continued to climb starting empiric  vancomycin and ceftaz.  Sodium improved down to 146.  Blood pressure improving but still not at goal added hydrochlorothiazide.  Resumed Brilinta.  10/23: Sodium normal.  Backing down on free water.  Glycemic control challenging, increased basal NovoLog in addition to Lantus and sliding scale.  Still spiking fever, white blood cell count climbing, sending blood cultures.  Foley catheter being placed for pressure ulcer caused by pure wick device 10/27 Foul smelling dark trach secretions, PEG secretions  10/28 to OR for ex lap due to PEG dislodgement.  PEG replaced 10/29 remains on MV 2/2 tachypnea; family meeting 10/30 no weaning 2/2 to ongoing tachypnea; foul smelling drainage from abd site 10/31 no weaning, abx changed, family meeting 11/1 repeat Ex-lap for I&D of absces 11/1 tolerated > 48h trach collar trial 11/7 on ATC since  Consults: date of consult/date signed off & final recs:  Cards 10/10 Neurology 10/10 PCCM 10/10 CCS 10/17  Procedures (surgical and bedside):  10/10 Neuro IR >> s/p stent and angioplasty of distal basilar artery  10/10 ETT for procedure 10/10 ETT (reintubated) >> removed 10/21 10/10 Size 6 tracheostomy placed by Dr. Nelda Marseille 10/26 changed to #4 trach cuffless  10/28 Ex lap with replacement of gastrostomy tube due to dislodgement 11/1 repeat Ex-lap with I&D of abdominal wall abscess  11/12 trach change  Significant Diagnostic Tests:  10/10  CTH >>  No acute intracranial abnormalities. Mild white matter changes likely due to small vessel ischemia 10/10 CTH >> No acute abnormality and no change from earlier today 10/10 CTA head and neck >> Extensive atherosclerotic disease in the  basilar with focal critical stenosis in the mid to distal basilar. No definite acute thrombus identified. Severe stenosis left posterior cerebral artery and moderate stenosis right posterior cerebral artery. Mild stenosis left MCA and moderate stenosis left MCA bifurcation. No significant  carotid or vertebral artery stenosis in the neck. 10/10 MRI >> evolving pontine infarct. 10/11 ECHO >> normal LVSF with severe LVH. 10/28 CT A / P > dislodged PEG tube outside of the peritoneum and not within the stomach.  Cholelithiasis. No bowel obstruction.  No hydronephrosis.  10/30 CT abd/pelvis >> Mild bilateral atelectasis.  Interval placement of new gastrostomy tube with tip and balloon within gastric lumen. Surgical drain is seen in the soft tissues in previous gastrostomy site. Small amount of gas and stranding is seen in the peritoneal fat anterior and inferior to the stomach which most likely is related to gastrostomy placement. Probable surgical wound seen in right upper quadrant of the anterior abdominal wall.  8.2 x 2.3 cm gas and fluid collection is seen in the anterior abdominal wall which is unchanged compared to prior exam. Possible abscess cannot be excluded.  Enlarged fibroid uterus.  11/14 B LE venous dopplers  >>  Micro Data:  10/10 MRSA PCR >> negative 10/21 Sputum culture >> nl resp flora  10/23 Blood cultures x 2 >> negative 10/26 Blood cultures x 2 >> neg 10/28 MRSA PCR >> neg 10/27 Sputum culture >> normal flora 11/1 Abdominal abscess> kelbsiella, proteus, susceptible to imipenem, amp-sulbactam, ESBL  Antimicrobials:  10/10 cefazolin preop 10/22 Vancomycin >> 10/30 10/22 Ceftaz >> 10/31 10/31 ceftriaxone >> 11/4 10/31 flagyl >> 11/4 11/4 meropenem >> 11/9  Subjective:  No dyspnea on ATC.   Per SLP note 11/13- patient still not tolerating PMV > 15 mins Per RT- not a lot of secretions but noted to need frequent cleaning of inner cannula after finding hard crustations  C/o of right leg pain  Objective   Blood pressure (!) 142/90, pulse 71, temperature 98.2 F (36.8 C), temperature source Oral, resp. rate (!) 24, height _0  (1.626 m), weight 76 kg, last menstrual period 07/26/2018, SpO2 99 %.    FiO2 (%):  [28 %] 28 %   Intake/Output Summary (Last  24 hours) at 09/18/2018 1116 Last data filed at 09/18/2018 0500 Gross per 24 hour  Intake -  Output 2450 ml  Net -2450 ml   Filed Weights   09/15/18 0500 09/17/18 0500 09/18/18 0500  Weight: 130.4 kg 128.4 kg 76 kg   Examination:   General:  Obese female sitting up in bed in NAD HEENT: MM pink/moist, midline 4 cuffless trach- no secretions noted, site wnl, patient able to mildly phonate around trach without PMV Neuro: Awake, mouths words to communicate, f/c, 2/5 strength on left, right remains flacid CV: RR, no murmur PULM: even/non-labored on ATC 28%, lungs bilaterally clear anteriorly GI: soft, large abd dressing-no odor, PEG in place- dressing clean Extremities: warm/dry, generalized 2+ edema, noted that RLE with warmth, erythema and tender Skin: no rashes   Assessment & Plan:   Acute respiratory failure/ hypoxemia due to inability to protect airway from cva - s/p JY trach 10/10 - on ATC since 11/6  P: Continue ATC w/ humidified O2  Aggressive pulm hygiene and  trach care- will need inner cannula cleaned/assessed daily No significant secretions but will add duonebs TID as preventative PRN albuterol  Appreciate ongoing SLP evaluation/ care  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  Abd wall abscess with polymicrobial drainage - s/p I&D 10/29 by CCS P: Continue current wound care management per CCS   Hypertension P: Ongoing norvasc, apresoline  Prn apresoline/ labetalol for SBP greater than 180  Concern for RLE DVT - VTE ppx SCDs P:  Bilateral venous duplex ordered    Disposition / Summary of Today's Plan 09/18/18   Ongoing trach care  Pending venous dopplers to r/o DVT  Ready for  LTACH placement once no further abdominal wound surgery required PCCM to continue to follow for trach management.   Best Practice  Diet: TFs Pain/Anxiety/Delirium protocol (if indicated): PRN medication only. VAP protocol (if indicated): Ordered DVT prophylaxis: SCDs on DAPT GI prophylaxis: Famotidine Hyperglycemia protocol: SSI, lantus Mobility: Ad lib with rehab. Code Status: Full  Family: No family at bedside 11/14.      Kennieth Rad, AGACNP-BC [facility] Pgr: [phone] or if no answer [phone] 09/18/2018, 11:39 AM

## 2018-09-18 NOTE — Progress Notes (Signed)
Patient is having deeper respirations and mucous is not clearing very well with suction. Marlene Bast, RN

## 2018-09-18 NOTE — Progress Notes (Signed)
Patient has started developing crusty plugs that are occluding her inner cannula. Since yesterday afternoon the inner cannula has had at least 4 of these plugs that I am aware of. Vital signs do not seem to change much over this however she does appear more winded when talking because she is able to talk around her trach. Patient says she is not SOB even though she appears that way. In my opinion the inner cannula should be checked every 4 hours due to the frequency of plugs. CCM is aware of this and I have paged speech therapy to let them know what is going on with her causing her speech therapy time not to be very successful.

## 2018-09-18 NOTE — Social Work (Signed)
CSW continuing to follow to facilitate SNF placement. Several  facilities are aware of referral and looking over pts information.   Westley Hummer, MSW, [place] Work [phone]

## 2018-09-19 ENCOUNTER — Inpatient Hospital Stay (HOSPITAL_COMMUNITY): Payer: Medicaid Other

## 2018-09-19 LAB — CBC
HEMATOCRIT: 33.6 % — AB (ref 36.0–46.0)
Hemoglobin: 9.6 g/dL — ABNORMAL LOW (ref 12.0–15.0)
MCH: 24.8 pg — ABNORMAL LOW (ref 26.0–34.0)
MCHC: 28.6 g/dL — ABNORMAL LOW (ref 30.0–36.0)
MCV: 86.8 fL (ref 80.0–100.0)
NRBC: 0 % (ref 0.0–0.2)
Platelets: 579 10*3/uL — ABNORMAL HIGH (ref 150–400)
RBC: 3.87 MIL/uL (ref 3.87–5.11)
RDW: 17.6 % — ABNORMAL HIGH (ref 11.5–15.5)
WBC: 9.6 10*3/uL (ref 4.0–10.5)

## 2018-09-19 LAB — BASIC METABOLIC PANEL
ANION GAP: 8 (ref 5–15)
BUN: 26 mg/dL — ABNORMAL HIGH (ref 6–20)
CO2: 22 mmol/L (ref 22–32)
Calcium: 10.3 mg/dL (ref 8.9–10.3)
Chloride: 104 mmol/L (ref 98–111)
Creatinine, Ser: 0.73 mg/dL (ref 0.44–1.00)
Glucose, Bld: 111 mg/dL — ABNORMAL HIGH (ref 70–99)
Potassium: 4.5 mmol/L (ref 3.5–5.1)
SODIUM: 134 mmol/L — AB (ref 135–145)

## 2018-09-19 LAB — GLUCOSE, CAPILLARY
GLUCOSE-CAPILLARY: 106 mg/dL — AB (ref 70–99)
GLUCOSE-CAPILLARY: 110 mg/dL — AB (ref 70–99)
GLUCOSE-CAPILLARY: 89 mg/dL (ref 70–99)
Glucose-Capillary: 141 mg/dL — ABNORMAL HIGH (ref 70–99)
Glucose-Capillary: 121 mg/dL — ABNORMAL HIGH (ref 70–99)
Glucose-Capillary: 157 mg/dL — ABNORMAL HIGH (ref 70–99)

## 2018-09-19 LAB — HEMOGLOBIN AND HEMATOCRIT, BLOOD
HEMATOCRIT: 29.9 % — AB (ref 36.0–46.0)
Hemoglobin: 9 g/dL — ABNORMAL LOW (ref 12.0–15.0)

## 2018-09-19 MED ORDER — HYDROCODONE-ACETAMINOPHEN 7.5-325 MG/15ML PO SOLN
15.0000 mL | ORAL | Status: DC | PRN
  Administered 2018-09-19 – 2018-10-14 (×59): 15 mL via ORAL
  Filled 2018-09-19 (×67): qty 15

## 2018-09-19 MED ORDER — FERROUS SULFATE 325 (65 FE) MG PO TABS
325.0000 mg | ORAL_TABLET | Freq: Three times a day (TID) | ORAL | Status: DC
  Administered 2018-09-20 – 2018-09-21 (×4): 325 mg via ORAL
  Filled 2018-09-19 (×4): qty 1

## 2018-09-19 MED ORDER — GUAIFENESIN 100 MG/5ML PO SOLN
5.0000 mL | ORAL | Status: DC | PRN
  Administered 2018-09-25 – 2018-10-13 (×11): 100 mg via ORAL
  Filled 2018-09-19 (×12): qty 10

## 2018-09-19 MED ORDER — BACLOFEN 10 MG PO TABS
10.0000 mg | ORAL_TABLET | Freq: Three times a day (TID) | ORAL | Status: DC
  Administered 2018-09-19 – 2018-10-14 (×73): 10 mg via ORAL
  Filled 2018-09-19 (×76): qty 1

## 2018-09-19 MED ORDER — TRAZODONE HCL 100 MG PO TABS
100.0000 mg | ORAL_TABLET | Freq: Every evening | ORAL | Status: DC | PRN
  Administered 2018-09-21: 100 mg via ORAL
  Filled 2018-09-19: qty 1

## 2018-09-19 MED ORDER — FREE WATER
200.0000 mL | Freq: Four times a day (QID) | Status: DC
  Administered 2018-09-20 (×2): 200 mL

## 2018-09-19 NOTE — Plan of Care (Signed)
  Problem: Clinical Measurements: Goal: Respiratory complications will improve Outcome: Progressing   Problem: Clinical Measurements: Goal: Ability to maintain clinical measurements within normal limits will improve Outcome: Progressing   Problem: Coping: Goal: Level of anxiety will decrease Outcome: Progressing

## 2018-09-19 NOTE — Plan of Care (Signed)
  Problem: Education: Goal: Knowledge of General Education information will improve Description Including pain rating scale, medication(s)/side effects and non-pharmacologic comfort measures Outcome: Progressing   Problem: Health Behavior/Discharge Planning: Goal: Ability to manage health-related needs will improve Outcome: Progressing   Problem: Clinical Measurements: Goal: Ability to maintain clinical measurements within normal limits will improve Outcome: Progressing Goal: Will remain free from infection Outcome: Progressing Goal: Diagnostic test results will improve Outcome: Progressing Goal: Respiratory complications will improve Outcome: Progressing Goal: Cardiovascular complication will be avoided Outcome: Progressing   Problem: Activity: Goal: Risk for activity intolerance will decrease Outcome: Progressing   Problem: Nutrition: Goal: Adequate nutrition will be maintained Outcome: Progressing   Problem: Coping: Goal: Level of anxiety will decrease Outcome: Progressing   Problem: Elimination: Goal: Will not experience complications related to bowel motility Outcome: Progressing Goal: Will not experience complications related to urinary retention Outcome: Progressing   Problem: Pain Managment: Goal: General experience of comfort will improve Outcome: Progressing   Problem: Safety: Goal: Ability to remain free from injury will improve Outcome: Progressing   Problem: Skin Integrity: Goal: Risk for impaired skin integrity will decrease Outcome: Progressing   Problem: Education: Goal: Understanding of cardiac disease, CV risk reduction, and recovery process will improve Outcome: Progressing Goal: Individualized Educational Video(s) Outcome: Progressing   Problem: Activity: Goal: Ability to tolerate increased activity will improve Outcome: Progressing   Problem: Cardiac: Goal: Ability to achieve and maintain adequate cardiovascular perfusion will  improve Outcome: Progressing   Problem: Health Behavior/Discharge Planning: Goal: Ability to safely manage health-related needs after discharge will improve Outcome: Progressing   Problem: Education: Goal: Knowledge of disease or condition will improve Outcome: Progressing Goal: Knowledge of secondary prevention will improve Outcome: Progressing Goal: Knowledge of patient specific risk factors addressed and post discharge goals established will improve Outcome: Progressing Goal: Individualized Educational Video(s) Outcome: Progressing   Problem: Coping: Goal: Will verbalize positive feelings about self Outcome: Progressing Goal: Will identify appropriate support needs Outcome: Progressing   Problem: Health Behavior/Discharge Planning: Goal: Ability to manage health-related needs will improve Outcome: Progressing   Problem: Self-Care: Goal: Ability to participate in self-care as condition permits will improve Outcome: Progressing Goal: Verbalization of feelings and concerns over difficulty with self-care will improve Outcome: Progressing Goal: Ability to communicate needs accurately will improve Outcome: Progressing   Problem: Nutrition: Goal: Risk of aspiration will decrease Outcome: Progressing Goal: Dietary intake will improve Outcome: Progressing   Problem: Ischemic Stroke/TIA Tissue Perfusion: Goal: Complications of ischemic stroke/TIA will be minimized Outcome: Progressing   Problem: Spiritual Needs Goal: Ability to function at adequate level Outcome: Progressing

## 2018-09-19 NOTE — Progress Notes (Signed)
This Probation officer notified all care team members (gen Sur., CCM, Stroke) about patient  bleeding large amounts of blood from vaginal this is not a menstrual flow! The patient is bleeding heavy with eggplant size clots. Patient states she was going to have an hysterectomy before this event caused her to be in the hospital.Nurse asked for CBC and Gynecologist consult.

## 2018-09-19 NOTE — Progress Notes (Addendum)
[facility]/Trauma Progress Note  14 Days Post-Op   Assessment/Plan Acute ischemic pontine stroke with stent placement in basilar to left proximal PCA Respiratory failure/ hypoxemia due to inability to protect airway from cvas/p trach Hypertensive Crisis Type 2 diabetes  S/p PEG placement 10/21 S/p exploratory laparotomy with open gastrostomy tube and I&D 10/28 S/p takeback for repeat I&D abdominal wall + exlap 11/1 - Continue wet to dry dressingsand TF - continue abdominal binder  FEN:TF VTE: SCD's,ASA & Brilinta UM:PNTI currently Follow up:Dr. Hulen Skains    LOS: 36 days    Subjective: CC: CVA  Pt is complaining about her trach and her legs are hurting.   Objective: Vital signs in last 24 hours: Temp:  [97.9 F (36.6 C)-98.6 F (37 C)] 98.6 F (37 C) (11/15 0744) Pulse Rate:  [65-85] 82 (11/15 0744) Resp:  [14-28] 28 (11/15 0744) BP: (133-154)/(63-90) 153/88 (11/15 0744) SpO2:  [96 %-100 %] 100 % (11/15 0744) FiO2 (%):  [21 %-28 %] 28 % (11/15 0426) Weight:  [144 kg] 123 kg (11/15 0500) Last BM Date: 09/13/18  Intake/Output from previous day: 11/14 0701 - 11/15 0700 In: 70  Out: 1800 [Urine:1800] Intake/Output this shift: No intake/output data recorded.  PE: Gen: Alert, NAD, pleasant, cooperative Pulm:HTC RXV:QMGQQPY drainage around G tube site, large incision looks well with some fascial separation but is clean and stable appearing, smaller wounds are clean and are without signs of infection.       Anti-infectives: Anti-infectives (From admission, onward)   Start     Dose/Rate Route Frequency Ordered Stop   09/08/18 1400  meropenem (MERREM) 1 g in sodium chloride 0.9 % 100 mL IVPB     1 g 200 mL/hr over 30 Minutes Intravenous Every 8 hours 09/08/18 1311 09/13/18 2241   09/04/18 1600  cefTRIAXone (ROCEPHIN) 2 g in sodium chloride 0.9 % 100 mL IVPB  Status:  Discontinued     2 g 200 mL/hr over 30 Minutes Intravenous Every  24 hours 09/04/18 1437 09/08/18 1311   09/04/18 1600  metroNIDAZOLE (FLAGYL) IVPB 500 mg  Status:  Discontinued     500 mg 100 mL/hr over 60 Minutes Intravenous Every 8 hours 09/04/18 1437 09/08/18 1311   09/02/18 0600  vancomycin (VANCOCIN) 500 mg in sodium chloride 0.9 % 100 mL IVPB  Status:  Discontinued     500 mg 100 mL/hr over 60 Minutes Intravenous Every 12 hours 08/31/2018 1619 09/03/18 1025   08/30/18 1600  vancomycin (VANCOCIN) IVPB 750 mg/150 ml premix  Status:  Discontinued     750 mg 150 mL/hr over 60 Minutes Intravenous Every 12 hours 08/30/18 1334 08/31/2018 1619   08/29/18 2300  vancomycin (VANCOCIN) IVPB 750 mg/150 ml premix  Status:  Discontinued     750 mg 150 mL/hr over 60 Minutes Intravenous Every 12 hours 08/29/18 1223 08/29/18 1327   08/29/18 2300  vancomycin (VANCOCIN) IVPB 1000 mg/200 mL premix  Status:  Discontinued     1,000 mg 200 mL/hr over 60 Minutes Intravenous Every 12 hours 08/29/18 1327 08/30/18 1139   08/29/18 1800  cefTAZidime (FORTAZ) 2 g in sodium chloride 0.9 % 100 mL IVPB  Status:  Discontinued     2 g 200 mL/hr over 30 Minutes Intravenous Every 8 hours 08/29/18 1228 09/04/18 1437   08/26/18 2300  vancomycin (VANCOCIN) IVPB 1000 mg/200 mL premix  Status:  Discontinued     1,000 mg 200 mL/hr over 60 Minutes Intravenous Every 12 hours 08/26/18 0923 08/29/18 1223  08/26/18 1000  vancomycin (VANCOCIN) 2,500 mg in sodium chloride 0.9 % 500 mL IVPB     2,500 mg 250 mL/hr over 120 Minutes Intravenous  Once 08/26/18 0923 08/26/18 1900   08/26/18 1000  cefTAZidime (FORTAZ) 1 g in sodium chloride 0.9 % 100 mL IVPB  Status:  Discontinued     1 g 200 mL/hr over 30 Minutes Intravenous Every 8 hours 08/26/18 0923 08/29/18 1228   08/27/2018 1014  ceFAZolin (ANCEF) 2-4 GM/100ML-% IVPB    Note to Pharmacy:  Roma Kayser  : cabinet override      08/15/2018 1014 08/25/2018 2229      Lab Results:  Recent Labs    09/16/18 1013  WBC 10.0  HGB 9.9*  HCT 33.1*   PLT 544*   BMET Recent Labs    09/16/18 1013  NA 134*  K 4.5  CL 105  CO2 23  GLUCOSE 149*  BUN 23*  CREATININE 0.67  CALCIUM 10.1   PT/INR No results for input(s): LABPROT, INR in the last 72 hours. CMP     Component Value Date/Time   NA 134 (L) 09/16/2018 1013   K 4.5 09/16/2018 1013   CL 105 09/16/2018 1013   CO2 23 09/16/2018 1013   GLUCOSE 149 (H) 09/16/2018 1013   BUN 23 (H) 09/16/2018 1013   CREATININE 0.67 09/16/2018 1013   CREATININE 0.91 04/09/2016 1213   CALCIUM 10.1 09/16/2018 1013   PROT 7.8 01/23/2018 1352   ALBUMIN 1.3 (L) 09/06/2018 0456   AST 14 (L) 01/23/2018 1352   ALT 11 (L) 01/23/2018 1352   ALKPHOS 92 01/23/2018 1352   BILITOT 0.8 01/23/2018 1352   GFRNONAA >60 09/16/2018 1013   GFRNONAA 71 04/09/2016 1213   GFRAA >60 09/16/2018 1013   GFRAA 82 04/09/2016 1213   Lipase     Component Value Date/Time   LIPASE 41 01/23/2018 1352    Studies/Results: No results found.    Kalman Drape , [facility] 09/19/2018, 8:18 AM  Pager: [phone] Mon-Wed, Friday 7:00am-4:30pm Thurs 7am-11:30am  Consults: [phone]

## 2018-09-19 NOTE — Progress Notes (Signed)
Occupational Therapy Treatment Patient Details Name: Andrea [name] MRN: [id number] DOB: [birth year] Today's Date: 09/19/2018    History of present illness 57 yo presented to ED with chest pain noted Rt hemiparesis in ED with acute Left paramedian brainstem infarct. Intubated 10/10 for angioplasty, extubated post procedure and reintubated. Repeat MRI with brainstem infarct extension and bil cerebellar infarcts. Peg/trach with return to vent 08/21/2018. 10/22-29 on trach collar, return to vent 10/29. 10/28 I & D of abdominal wall abscess with placement of penrose drain and G-tube, 11/1 repeat ex lap. 11/7 return to trach collar.  PMHx: HTN, DM, CKD   OT comments  Pt continues to demonstrate increased strength LUE. Set up with soft touch call bell, using L hand. Pt states "praise the Lord.Marland KitchenMarland KitchenI can get some help". Flaccid RUE; increased movement noted RLE. Able to facilitate rotation during reaching activities. Pt tearful at times stating "I can't do nothing; I'm like a baby". Plan is to lift pt to chair and push out of room next week. Pt motivated to get better so she can have a "peanut butter cracker". Will continue to follow.   Follow Up Recommendations  SNF;Supervision/Assistance - 24 hour    Equipment Recommendations  Other (comment)(TBA)    Recommendations for Other Services      Precautions / Restrictions Precautions Precautions: Fall Precaution Comments: Peg/trach, abdominal binder        Mobility Bed Mobility                  Transfers                      Balance                                           ADL either performed or assessed with clinical judgement   ADL Overall ADL's : Needs assistance/impaired     Grooming: Maximal assistance;Wash/dry face;Oral care                                       Vision   Additional Comments: pt wears glasses for reading   Perception     Praxis      Cognition  Arousal/Alertness: Awake/alert Behavior During Therapy: WFL for tasks assessed/performed Overall Cognitive Status: Impaired/Different from baseline Area of Impairment: Attention;Safety/judgement;Awareness;Orientation                   Current Attention Level: Selective   Following Commands: Follows one step commands consistently Safety/Judgement: Decreased awareness of deficits Awareness: Emergent   General Comments: "I'm going to walk over to that window"; pt tearful at times during session stating "I cna't do nothing for myself" "I'm like a baby"        Exercises General Exercises - Upper Extremity Shoulder Flexion: PROM;Both;10 reps;Supine;AAROM Shoulder ABduction: PROM;Both;10 reps;Supine;AAROM Shoulder ADduction: PROM;Both;10 reps;Supine;AAROM;Left Shoulder Horizontal ABduction: PROM;AROM;Both;15 reps;Seated Shoulder Horizontal ADduction: PROM;AROM;Both;15 reps;Seated Elbow Flexion: PROM;Both;10 reps;AAROM Elbow Extension: PROM;Both;10 reps;Supine;AAROM;Left Wrist Flexion: PROM;Both;10 reps;Supine;AAROM;Left Wrist Extension: PROM;Both;10 reps;Supine;AAROM Digit Composite Flexion: PROM;Both;5 reps;Supine;AAROM;Left Composite Extension: PROM;Both;5 reps;Supine;AAROM;Left Chair Push Up: PROM;Both;5 reps Other Exercises Other Exercises: hand to mouth movemetn patterns Other Exercises: e;bpw extension aginast gravity Other Exercises: 5/5 correct "pushs" with soft touch Other Exercises: grip strengthening with squeeze ball   Shoulder Instructions  General Comments      Pertinent Vitals/ Pain       Pain Assessment: Faces Faces Pain Scale: Hurts little more Pain Location: LEs with movemetn Pain Descriptors / Indicators: (STIFF)  Home Living                                          Prior Functioning/Environment              Frequency  Min 2X/week        Progress Toward Goals  OT Goals(current goals can now be found in the care  plan section)  Progress towards OT goals: Progressing toward goals  Acute Rehab OT Goals Patient Stated Goal: per family for pt to get stronger OT Goal Formulation: With patient/family Time For Goal Achievement: 09/26/18 Potential to Achieve Goals: Fair ADL Goals Pt Will Perform Grooming: with max assist;bed level Pt Will Perform Upper Body Bathing: with max assist;bed level Pt/caregiver will Perform Home Exercise Program: Increased ROM;With written HEP provided;Both right and left upper extremity Additional ADL Goal #1: Pt will sit EOB with +2 Max A x 10 min in preparation for functional activities.  Plan Discharge plan remains appropriate    Co-evaluation                 AM-PAC PT "6 Clicks" Daily Activity     Outcome Measure   Help from another person eating meals?: Total Help from another person taking care of personal grooming?: Total Help from another person toileting, which includes using toliet, bedpan, or urinal?: Total Help from another person bathing (including washing, rinsing, drying)?: Total Help from another person to put on and taking off regular upper body clothing?: Total Help from another person to put on and taking off regular lower body clothing?: Total 6 Click Score: 6    End of Session Equipment Utilized During Treatment: Oxygen  OT Visit Diagnosis: Other abnormalities of gait and mobility (R26.89);Muscle weakness (generalized) (M62.81);Low vision, both eyes (H54.2);Feeding difficulties (R63.3);Other symptoms and signs involving cognitive function;Hemiplegia and hemiparesis;Pain Hemiplegia - Right/Left: Right Hemiplegia - dominant/non-dominant: Non-Dominant Hemiplegia - caused by: Nontraumatic intracerebral hemorrhage;Cerebral infarction   Activity Tolerance Patient tolerated treatment well   Patient Left in bed;with call bell/phone within reach   Nurse Communication Mobility status        Time: 6314-9702 OT Time Calculation (min): 31  min  Charges: OT General Charges $OT Visit: 1 Visit OT Treatments $Therapeutic Activity: 8-22 mins $Neuromuscular Re-education: 8-22 mins  Maurie Boettcher, OT/L   Acute OT Clinical Specialist [place] Pager [phone] Office [phone]    [facility] 09/19/2018, 4:29 PM

## 2018-09-19 NOTE — Plan of Care (Cosign Needed Addendum)
Spoke with Ob Gyn on call. She recommended a trans vaginal pelvic US for further evaluation. We can call them with the results on Sunday or Monday unless there is an emergency over the weekend.  Mikey Bussing PA-C [facility] Pager [phone] 09/19/2018, 5:29 PM

## 2018-09-19 NOTE — Progress Notes (Signed)
STROKE TEAM PROGRESS NOTE   SUBJECTIVE (INTERVAL HISTORY)  patient's nurse informs me that she is having heavy vaginal bleeding and clots for the last 3 days. She however remains hemodynamically stable. She no longer complains of cough pain today. Lower extremity venous Dopplers yesterday were negative for DVT.  Her hematocrit has dropped from 33.6 this morning to 29.9 this evening.  OBJECTIVE Vitals:   09/19/18 0426 09/19/18 0500 09/19/18 0505 09/19/18 0744  BP: (!) 154/77  (!) 146/79 (!) 153/88  Pulse: 74   82  Resp: 18   (!) 28  Temp:    98.6 F (37 C)  TempSrc:    Oral  SpO2: 98%   100%  Weight:  123 kg    Height:        CBC:  Recent Labs  Lab 09/13/18 0504 09/16/18 1013  WBC 10.2 10.0  HGB 10.2* 9.9*  HCT 34.7* 33.1*  MCV 87.4 86.0  PLT 543* 544*    Basic Metabolic Panel:  Recent Labs  Lab 09/13/18 0504 09/16/18 1013  NA 139 134*  K 4.5 4.5  CL 107 105  CO2 24 23  GLUCOSE 96 149*  BUN 29* 23*  CREATININE 0.68 0.67  CALCIUM 9.9 10.1    Lipid Panel:     Component Value Date/Time   CHOL 256 (H) 08/15/2018 0512   TRIG 248 (H) 08/15/2018 0512   HDL 35 (L) 08/15/2018 0512   CHOLHDL 7.3 08/15/2018 0512   VLDL 50 (H) 08/15/2018 0512   LDLCALC 171 (H) 08/15/2018 0512   HgbA1c:  Lab Results  Component Value Date   HGBA1C 11.4 (H) 08/15/2018   Urine Drug Screen:     Component Value Date/Time   LABOPIA NONE DETECTED 08/28/2018 1836   COCAINSCRNUR NONE DETECTED 08/21/2018 1836   LABBENZ NONE DETECTED 09/01/2018 1836   AMPHETMU NONE DETECTED 08/18/2018 1836   THCU NONE DETECTED 08/09/2018 1836   LABBARB NONE DETECTED 08/21/2018 1836    Alcohol Level No results found for: ETH  IMAGING  Ct Angio Head W Or Wo Contrast Ct Angio Neck W Or Wo Contrast 08/15/2018 IMPRESSION:  1. Interval basilar to left proximal PCA stenting. The density of the stent walls precludes detection of in stent stenosis; there is a degree of wasting at the mid basilar  segment. There is flow in the left PCA beyond the stent such that there is presumed stent patency.  2. Known lower pontine infarct. There are new small cerebellar infarcts since brain MRI yesterday.  3. Severe left P2 segment stenosis, also seen previously.  4. Moderate atheromatous narrowing in the proximal left MCA.    Ct Angio Head W Or Wo Contrast Ct Angio Neck W Or Wo Contrast 08/22/2018 IMPRESSION:  1. 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 No definite acute thrombus identified.  2. Severe stenosis left posterior cerebral artery and moderate stenosis right posterior cerebral artery.  3. Mild stenosis left MCA and moderate stenosis left MCA bifurcation.  4. No significant carotid or vertebral artery stenosis in the neck.    Ct Head Wo Contrast 08/09/2018 IMPRESSION:  No acute intracranial abnormalities. Mild white matter changes likely due to small vessel ischemia.    Mr Brain Wo Contrast 08/12/2018 IMPRESSION:  1. Acute nonhemorrhagic infarct involving the left paramedian brainstem. The infarct crosses midline.  2. Other periventricular and subcortical white matter disease is moderately advanced for age. This likely reflects the sequela of chronic microvascular  ischemia.  3. Tapering of the dens with prominent soft tissue pannus. 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    Ct Head Code Stroke Wo Contrast 08/26/2018  IMPRESSION:  1. No acute abnormality and no change from earlier today  2. ASPECTS is 10 3.    Cerebral Angiogram 08/20/2018 S/P 4 vessel cerebral arteriogram RT CFA approach. 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 S/P stent assisted angioplasty of distal basilar artery with patency of 80 to 90 %. 2.Approx 70 % stenosis of LT ICA supraclinoid seg   MRI 08/15/18 1. Progressive  acute pontine infarct. New patchy bilateral cerebellar infarction. New tiny left thalamic infarcts. 2. Preserved flow void in the stented basilar. 3. left facial/submandibular edema, please correlate with neck exam. Edited result: IMPRESSION: 1. Progressive acute pontine infarct. New patchy bilateral cerebellar infarction. New tiny left thalamic infarcts. 2. Preserved flow void in the stented basilar.  TTE - Left ventricle: The cavity size was normal. There was severe   concentric hypertrophy. Systolic function was normal. The   estimated ejection fraction was in the range of 60% to 65%. Wall   motion was normal; there were no regional wall motion   abnormalities. Doppler parameters are consistent with abnormal   left ventricular relaxation (grade 1 diastolic dysfunction). The   E/e&' ratio is <8, suggesting normal LV filling pressure. - Aortic valve: Trileaflet; mildly calcified leaflets.   Transvalvular velocity was minimally increased. There was no   regurgitation. Mean gradient (S): 12 mm Hg. - Mitral valve: Mildly thickened leaflets . There was trivial   regurgitation. - Left atrium: The atrium was normal in size. - Inferior vena cava: The vessel was dilated. The respirophasic   diameter changes were blunted (< 50%), consistent with elevated   central venous pressure. Impressions: - LVEF 60-65%, severe LVH, normal wall motion, grade 1 DD, normal   LV filling pressure, minimally increased aortic velocity without   signficant stenosis, trivial MR, normal LA size, dilated IVC.  CT abdomen and pelvis 09/03/18 Mild bilateral posterior basilar subsegmental atelectasis. Interval placement of new gastrostomy tube with tip and balloon within gastric lumen. Surgical drain is seen in the soft tissues in previous gastrostomy site. Small amount of gas and stranding is seen in the peritoneal fat anterior and inferior to the stomach which most likely is related to gastrostomy placement.  Probable surgical wound seen in right upper quadrant of the anterior abdominal wall. 8.2 x 2.3 cm gas and fluid collection is seen in the anterior abdominal wall which is unchanged compared to prior exam. Possible abscess cannot be excluded. Enlarged fibroid uterus.  PHYSICAL EXAM  Temp:  [97.9 F (36.6 C)-98.6 F (37 C)] 98.6 F (37 C) (11/15 0744) Pulse Rate:  [65-85] 82 (11/15 0744) Resp:  [14-28] 28 (11/15 0744) BP: (133-154)/(63-90) 153/88 (11/15 0744) SpO2:  [96 %-100 %] 100 % (11/15 0744) FiO2 (%):  [21 %-28 %] 28 % (11/15 0426) Weight:  [676 kg] 123 kg (11/15 0500)  General -  Obese middle-aged African-American lady, on trach collar, not in distress. She has abdominal surgical wound and drainage from the abdominal wall, dressing clean  Ophthalmologic - fundi not visualized due to noncooperation.  Cardiovascular - regular rate and rhythm  Neuro - post trach, on trach collar, eyes open, awake, alert. No ptosis or EOMI, denies diplopia.  PERRL.  Able to wiggle left hand fingers and  left foot toes only.  Follows commands and good eye contact.  Speech is mildly dysarthric.   Dense right hemiplegia persists.  ASSESSMENT/PLAN [name] is a 57 y.o. female with history of difficult to control hypertension, insulin-dependent diabetes, obesity, hypothyroidism status post ablation presenting with chest discomfort, right-sided weakness, slurred speech and right facial droop. She did not receive IV t-PA due to late presentation. S/P stent assisted angioplasty of distal basilar artery.  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 Worsening symptoms with extension of pontine/medullary infarcts with new small bilateral cerebellar infarcts without evidence of stent re-stenosis or occlusion  Resultant quadriplegia, on trach and peg  CT head - No acute intracranial abnormalities.  CTA H&N 09/02/2018 - Extensive atherosclerotic disease in  the basilar with focal critical stenosis in the mid to distal basilar. No definite acute thrombus identified. Severe stenosis left posterior cerebral artery  MRI head - Acute nonhemorrhagic infarct involving the left paramedian brainstem.   CTA H&N 08/15/2018 - stent too dense to see BA lumen, but distal flow preserved. New small cerebellar infarcts since brain MRI yesterday.  Severe left P2 segment stenosis, also seen previously.   Repeat MRI - extension of b/l pontine and upper medullary infarcts with new b/l cerebellar infarcts.  2D Echo - EF 60-65%  LDL - 174  HgbA1c - 11.1  VTE prophylaxis - heparin subq  aspirin 81 mg daily prior to admission, now resumed on Brilinta 90 mg bid and ASA 81 mg daily.   Patient counseled to be compliant with her antithrombotic medications  Ongoing aggressive stroke risk factor management  Therapy recommendations:  LTACH Disposition:  SNF Intracranial stenosis  BA mid to distal severe stenosis s/p BA stenting  Left PCA severe stenosis, R PCA moderate stenosis  Left MCA moderate stenosis  Uncontrolled stroke risk factors with HLD, HTN, DM  Non compliance with meds at home  Respiratory failure  S/p trach 08/21/2018  On trach collar now  Working with speech therapist for speaking valve  CCM on board  Hypertension  Stable but fluctuate with position   BP goal 130-150   On po norvasc, Coreg 25 bid, and hydralazine 25mg  tid  Hyperlipidemia  Lipid lowering medication PTA:  Lipitor 20 mg daily  LDL 174, goal < 70  Increased to 80 mg daily  Continue statin at discharge  Diabetes  HgbA1c 11.4, goal < 7.0  Uncontrolled at home  Glucose stable on insulin   Decrease Lantus again today to 18U bid   Decrease basal NovoLog again today to 4U Q4h  SSI  CBG monitoring  Low grade fever with leukocytosis  Tmax 101.6 -> afebrile  Leukocytosis - 14.0->12.1->9.9->11.2->11.5->11.3  UA WBC 21-50  CXR unremarkable,  improving from prior  On meropenem now  Blood cultures negative 10/26 ;  Respiratory cultures - mixed flora   Abdominal abscess culture showed proteus mirabilis  CCM and trauma on board  Hypernatremia and elevated Cre/BUN  Na 149->148->150->147->144->143  Cre 1.31->1.09->0.86->0.80->0.73->0.73  Continue 1/2NS @ 50  On TF @ 45  On free water 300mg  Q4h  Continue BMP monitoring  Dysphagia s/p PEG complicated by abdominal wall abscess  Continue tube feeding @ 45 cc/h  PEG done 08/16/2018  Removed and replaced dislodged PEG 09/03/2018  CT showed Abdominal wall abscess - s/p open surgical drainage 08/30/2018 persistent foul odor discharge. Reexploration 09/28/2018  Culture showed peroteus mirabilis  On meropenum now   Trauma surgery following, appreciate recs  Anemia   Hb 6.8->PRBC->7.1->6.8->8.2->8.4->8.9->8.6->9.0  Could be due to iron deficiency and acute uterine bleeding and recent surgeries  Iron panel showed iron deficiency  Put on iron pills  PRBC transfusion 1 U 09/19/2018 and 2U 09/07/18  CBC monitoring  Abnormal uterine bleeding, improved  Has been following with GYN  Was on megace in the past  CT abd/pelvis 09/03/18 - enlarged fibroid uterus  resumed megace 80mg  bid  Continue ASA and brilinta for now  Close CBC monitoring  Other Stroke Risk Factors  Obesity, Body mass index is 46.55 kg/m., recommend weight loss, diet and exercise as appropriate   Other Active Problems  Hypokalemia - on supplement, now resolved  Thrombocytosis - Plts - 489 -> 553-> 535->479->492->497->484->502 likely due to anemia  Hospital day # 36  This patient is critically ill due to worsening brainstem infarct, BA stenosis s/p stenting, hyperglycemia, hypertensive, fever, anemia, abdominal wall abscess s/p drainage, dislodged PEG and hypernatremiaand at significant risk of neurological worsening, death form recurrent stroke, hemorrhagic conversion, CHF, seizure,  aspiration, hemorrhagic shock. This patient's care requires constant monitoring of vital signs, hemodynamics, respiratory and cardiac monitoring, review of multiple databases, neurological assessment, discussion with family, other specialists and medical decision making of high complexity. I spent 30 minutes of neurocritical care time in the care of this patient.    She has had recurrent vaginal bleeding with clots. Continue to monitor hematocrit and may consider transfusion if she meets criteria. Discuss with Dr. Lynetta Mare critical care medicine. Not sure if gynecological consult at this point may be beneficial but may need to do so in the future if bleeding continues.   Antony Contras, MD  To contact Stroke Continuity provider, please refer to [email or web address] After hours, contact General Neurology

## 2018-09-20 DIAGNOSIS — D219 Benign neoplasm of connective and other soft tissue, unspecified: Secondary | ICD-10-CM

## 2018-09-20 LAB — GLUCOSE, CAPILLARY
GLUCOSE-CAPILLARY: 152 mg/dL — AB (ref 70–99)
Glucose-Capillary: 102 mg/dL — ABNORMAL HIGH (ref 70–99)
Glucose-Capillary: 119 mg/dL — ABNORMAL HIGH (ref 70–99)
Glucose-Capillary: 131 mg/dL — ABNORMAL HIGH (ref 70–99)
Glucose-Capillary: 145 mg/dL — ABNORMAL HIGH (ref 70–99)
Glucose-Capillary: 84 mg/dL (ref 70–99)
Glucose-Capillary: 98 mg/dL (ref 70–99)

## 2018-09-20 LAB — CBC WITH DIFFERENTIAL/PLATELET
Abs Immature Granulocytes: 0.06 10*3/uL (ref 0.00–0.07)
BASOS ABS: 0 10*3/uL (ref 0.0–0.1)
Basophils Relative: 0 %
EOS ABS: 0.1 10*3/uL (ref 0.0–0.5)
EOS PCT: 1 %
HCT: 26.5 % — ABNORMAL LOW (ref 36.0–46.0)
Hemoglobin: 7.7 g/dL — ABNORMAL LOW (ref 12.0–15.0)
Immature Granulocytes: 1 %
Lymphocytes Relative: 20 %
Lymphs Abs: 1.9 10*3/uL (ref 0.7–4.0)
MCH: 24.8 pg — AB (ref 26.0–34.0)
MCHC: 29.1 g/dL — ABNORMAL LOW (ref 30.0–36.0)
MCV: 85.5 fL (ref 80.0–100.0)
MONO ABS: 0.7 10*3/uL (ref 0.1–1.0)
Monocytes Relative: 8 %
NRBC: 0.2 % (ref 0.0–0.2)
Neutro Abs: 6.7 10*3/uL (ref 1.7–7.7)
Neutrophils Relative %: 70 %
PLATELETS: 503 10*3/uL — AB (ref 150–400)
RBC: 3.1 MIL/uL — AB (ref 3.87–5.11)
RDW: 17.3 % — ABNORMAL HIGH (ref 11.5–15.5)
WBC: 9.5 10*3/uL (ref 4.0–10.5)

## 2018-09-20 LAB — RENAL FUNCTION PANEL
ALBUMIN: 1.9 g/dL — AB (ref 3.5–5.0)
Anion gap: 6 (ref 5–15)
BUN: 35 mg/dL — ABNORMAL HIGH (ref 6–20)
CALCIUM: 10.1 mg/dL (ref 8.9–10.3)
CO2: 22 mmol/L (ref 22–32)
CREATININE: 0.85 mg/dL (ref 0.44–1.00)
Chloride: 105 mmol/L (ref 98–111)
GFR calc Af Amer: >60 mL/min (ref >60)
Glucose, Bld: 143 mg/dL — ABNORMAL HIGH (ref 70–99)
PHOSPHORUS: 3.5 mg/dL (ref 2.5–4.6)
Potassium: 4.6 mmol/L (ref 3.5–5.1)
SODIUM: 133 mmol/L — AB (ref 135–145)

## 2018-09-20 MED ORDER — SODIUM CHLORIDE 0.9 % IV SOLN: INTRAVENOUS | Status: DC | PRN

## 2018-09-20 MED ORDER — ESTROGENS CONJUGATED 25 MG IJ SOLR
25.0000 mg | Freq: Once | INTRAMUSCULAR | Status: AC
  Administered 2018-09-20: 25 mg via INTRAVENOUS
  Filled 2018-09-20: qty 25

## 2018-09-20 MED ORDER — SODIUM CHLORIDE 0.9 % IV SOLN
INTRAVENOUS | Status: DC
  Administered 2018-09-20: 40 mL/h via INTRAVENOUS
  Administered 2018-09-24 – 2018-10-07 (×2): via INTRAVENOUS

## 2018-09-20 NOTE — Plan of Care (Signed)
  Problem: Clinical Measurements: Goal: Ability to maintain clinical measurements within normal limits will improve Outcome: Progressing   

## 2018-09-20 NOTE — Progress Notes (Signed)
Reason for Consult:abnormal uterine bleeding Referring Physician: Garvin Fila, MD  Andrea [name] is an 57 y.o. female. [id number] Patient's last menstrual period was 07/26/2018. Vaginal bleeding was noted for the last 3 days Dr. Ihor Dow was called in reference to abnormal uterine bleeding. A pelvic US was done yesterday at her request and I reviewed the result. The patient was last seen at [facility] 11/2016 and was known to have a uterine fibroid and DUB. At the time she was considered a poor surgical candidate due to comorbidities and was given Megace which controlled her bleeding. Endometrial biopsy and pap were benign. Fibroid uterus was noted on CT scan done 09/03/18.   Menstrual History:  Patient's last menstrual period was 07/26/2018.    Past Medical History:  Diagnosis Date  . Diabetes mellitus   . Environmental allergies   . Fibroid   . High cholesterol   . Hx of cardiovascular stress test    a. ETT-MV 2/14:  EF 46% (normal by echo), low risk, no ischemia or scar  . Hx of echocardiogram    Echo 2/14: mild LVH, EF 55-60%, Gr 1 diast dysfn, mild LAE  . Hypertension   . Hyperthyroidism    Radioactive iodine ablation    Past Surgical History:  Procedure Laterality Date  . [place], 2001   x 2  . ESOPHAGOGASTRODUODENOSCOPY N/A 08/26/2018   Procedure: ESOPHAGOGASTRODUODENOSCOPY (EGD);  Surgeon: Judeth Horn, MD;  Location: [place];  Service: General;  Laterality: N/A;  bedside  . GASTROSTOMY N/A 08/20/2018   Procedure: INSERTION OF GASTROSTOMY TUBE;  Surgeon: Judeth Horn, MD;  Location: [place];  Service: General;  Laterality: N/A;  . IR ANGIO INTRA EXTRACRAN SEL COM CAROTID INNOMINATE BILAT MOD SED  08/07/2018  . IR ANGIO VERTEBRAL SEL SUBCLAVIAN INNOMINATE UNI R MOD SED  08/23/2018  . IR CT HEAD LTD  08/17/2018  . IR INTRA CRAN STENT  08/19/2018  . LAPAROTOMY N/A 08/17/2018   Procedure: EXPLORATORY LAPAROTOMY;  Surgeon: Judeth Horn, MD;   Location: [place];  Service: General;  Laterality: N/A;  . LAPAROTOMY N/A 09/08/2018   Procedure: EXPLORATORY LAPAROTOMY;  Surgeon: Judeth Horn, MD;  Location: [place];  Service: General;  Laterality: N/A;  . PEG PLACEMENT N/A 08/24/2018   Procedure: PERCUTANEOUS ENDOSCOPIC GASTROSTOMY (PEG) PLACEMENT;  Surgeon: Judeth Horn, MD;  Location: [place];  Service: General;  Laterality: N/A;  . RADIOLOGY WITH ANESTHESIA N/A 08/11/2018   Procedure: IR WITH ANESTHESIA;  Surgeon: Luanne Bras, MD;  Location: [place];  Service: Radiology;  Laterality: N/A;    Family History  Problem Relation Age of Onset  . Pneumonia Mother   . Diabetes Mother   . Hypertension Father   . Hypertension Sister   . Hyperlipidemia Sister   . Hypertension Brother   . Hypertension Maternal Aunt   . Hypertension Paternal Aunt   . Cancer Paternal Aunt        throat cancer  . Diabetes Maternal Grandmother   . Diabetes Paternal Grandmother   . Cancer Paternal Grandmother        pancreatic cancer    Social History:  reports that she has never smoked. She has never used smokeless tobacco. She reports that she does not drink alcohol or use drugs.  Allergies:  Allergies  Allergen Reactions  . Tramadol     "Makes me sees things and I just dont act right"    Medications: I have reviewed the patient's current medications.  ROS  Blood pressure (!) 103/59, pulse 73, temperature 98.3 F (36.8 C), temperature source Oral, resp. rate 18, height 5' 4" (1.626 m), weight 123 kg, last menstrual period 07/26/2018, SpO2 96 %. Physical Exam  Results for orders placed or performed during the hospital encounter of 08/20/2018 (from the past 48 hour(s))  Glucose, capillary     Status: Abnormal   Collection Time: 09/18/18 11:55 AM  Result Value Ref Range   Glucose-Capillary 126 (H) 70 - 99 mg/dL  Glucose, capillary     Status: None   Collection Time: 09/18/18  4:10 PM  Result Value Ref Range   Glucose-Capillary 95 70 - 99  mg/dL  Glucose, capillary     Status: None   Collection Time: 09/18/18  7:44 PM  Result Value Ref Range   Glucose-Capillary 96 70 - 99 mg/dL  Glucose, capillary     Status: Abnormal   Collection Time: 09/19/18 12:06 AM  Result Value Ref Range   Glucose-Capillary 110 (H) 70 - 99 mg/dL  Glucose, capillary     Status: Abnormal   Collection Time: 09/19/18  5:13 AM  Result Value Ref Range   Glucose-Capillary 106 (H) 70 - 99 mg/dL  Glucose, capillary     Status: None   Collection Time: 09/19/18  7:41 AM  Result Value Ref Range   Glucose-Capillary 89 70 - 99 mg/dL  CBC     Status: Abnormal   Collection Time: 09/19/18  9:01 AM  Result Value Ref Range   WBC 9.6 4.0 - 10.5 K/uL   RBC 3.87 3.87 - 5.11 MIL/uL   Hemoglobin 9.6 (L) 12.0 - 15.0 g/dL   HCT 33.6 (L) 36.0 - 46.0 %   MCV 86.8 80.0 - 100.0 fL   MCH 24.8 (L) 26.0 - 34.0 pg   MCHC 28.6 (L) 30.0 - 36.0 g/dL   RDW 17.6 (H) 11.5 - 15.5 %   Platelets 579 (H) 150 - 400 K/uL   nRBC 0.0 0.0 - 0.2 %    Comment: Performed at [facility], [address], [place], [place] [address]  Basic metabolic panel     Status: Abnormal   Collection Time: 09/19/18  9:01 AM  Result Value Ref Range   Sodium 134 (L) 135 - 145 mmol/L   Potassium 4.5 3.5 - 5.1 mmol/L   Chloride 104 98 - 111 mmol/L   CO2 22 22 - 32 mmol/L   Glucose, Bld 111 (H) 70 - 99 mg/dL   BUN 26 (H) 6 - 20 mg/dL   Creatinine, Ser 0.73 0.44 - 1.00 mg/dL   Calcium 10.3 8.9 - 10.3 mg/dL   GFR calc non Af Amer >60 >60 mL/min   GFR calc Af Amer >60 >60 mL/min    Comment: (NOTE) The eGFR has been calculated using the CKD EPI equation. This calculation has not been validated in all clinical situations. eGFR's persistently <60 mL/min signify possible Chronic Kidney Disease.    Anion gap 8 5 - 15    Comment: Performed at [facility] [address] [place], [place] [address]  Glucose, capillary     Status: Abnormal   Collection Time: 09/19/18 12:03 PM  Result Value Ref  Range   Glucose-Capillary 141 (H) 70 - 99 mg/dL  Hemoglobin and hematocrit, blood     Status: Abnormal   Collection Time: 09/19/18  4:25 PM  Result Value Ref Range   Hemoglobin 9.0 (L) 12.0 - 15.0 g/dL   HCT 29.9 (L) 36.0 -  46.0 %    Comment: Performed at [facility], [place] [address], [place], [place] [address]  Glucose, capillary     Status: Abnormal   Collection Time: 09/19/18  4:29 PM  Result Value Ref Range   Glucose-Capillary 121 (H) 70 - 99 mg/dL  Glucose, capillary     Status: Abnormal   Collection Time: 09/19/18  8:56 PM  Result Value Ref Range   Glucose-Capillary 157 (H) 70 - 99 mg/dL  Glucose, capillary     Status: Abnormal   Collection Time: 09/20/18 12:43 AM  Result Value Ref Range   Glucose-Capillary 145 (H) 70 - 99 mg/dL  CBC with Differential/Platelet     Status: Abnormal   Collection Time: 09/20/18  3:23 AM  Result Value Ref Range   WBC 9.5 4.0 - 10.5 K/uL   RBC 3.10 (L) 3.87 - 5.11 MIL/uL   Hemoglobin 7.7 (L) 12.0 - 15.0 g/dL   HCT 26.5 (L) 36.0 - 46.0 %   MCV 85.5 80.0 - 100.0 fL   MCH 24.8 (L) 26.0 - 34.0 pg   MCHC 29.1 (L) 30.0 - 36.0 g/dL   RDW 17.3 (H) 11.5 - 15.5 %   Platelets 503 (H) 150 - 400 K/uL   nRBC 0.2 0.0 - 0.2 %   Neutrophils Relative % 70 %   Neutro Abs 6.7 1.7 - 7.7 K/uL   Lymphocytes Relative 20 %   Lymphs Abs 1.9 0.7 - 4.0 K/uL   Monocytes Relative 8 %   Monocytes Absolute 0.7 0.1 - 1.0 K/uL   Eosinophils Relative 1 %   Eosinophils Absolute 0.1 0.0 - 0.5 K/uL   Basophils Relative 0 %   Basophils Absolute 0.0 0.0 - 0.1 K/uL   Immature Granulocytes 1 %   Abs Immature Granulocytes 0.06 0.00 - 0.07 K/uL    Comment: Performed at [facility], [address] [place], [place] [address]  Renal function panel     Status: Abnormal   Collection Time: 09/20/18  3:23 AM  Result Value Ref Range   Sodium 133 (L) 135 - 145 mmol/L   Potassium 4.6 3.5 - 5.1 mmol/L   Chloride 105 98 - 111 mmol/L   CO2 22 22 - 32 mmol/L   Glucose, Bld  143 (H) 70 - 99 mg/dL   BUN 35 (H) 6 - 20 mg/dL   Creatinine, Ser 0.85 0.44 - 1.00 mg/dL   Calcium 10.1 8.9 - 10.3 mg/dL   Phosphorus 3.5 2.5 - 4.6 mg/dL   Albumin 1.9 (L) 3.5 - 5.0 g/dL   GFR calc non Af Amer >60 >60 mL/min   GFR calc Af Amer >60 >60 mL/min    Comment: (NOTE) The eGFR has been calculated using the CKD EPI equation. This calculation has not been validated in all clinical situations. eGFR's persistently <60 mL/min signify possible Chronic Kidney Disease.    Anion gap 6 5 - 15    Comment: Performed at [facility] [address] [place], [place] [address]  Glucose, capillary     Status: Abnormal   Collection Time: 09/20/18  4:27 AM  Result Value Ref Range   Glucose-Capillary 131 (H) 70 - 99 mg/dL  Glucose, capillary     Status: Abnormal   Collection Time: 09/20/18  7:51 AM  Result Value Ref Range   Glucose-Capillary 102 (H) 70 - 99 mg/dL    US Pelvis (transabdominal Only)  Result Date: 09/19/2018 CLINICAL DATA:  Acute onset of vaginal bleeding.  Known uterine fibroid. EXAM: TRANSABDOMINAL ULTRASOUND OF PELVIS TECHNIQUE: Transabdominal ultrasound examination of the pelvis was performed including evaluation of the uterus, ovaries, adnexal regions, and pelvic cul-de-sac. COMPARISON:  CT of the abdomen and pelvis performed 09/03/2018 FINDINGS: Uterus Measurements: 12.3 x 8.4 x 8.9 cm = volume: 474.9 mL. A large uterine fibroid is noted, measuring 6.6 x 5.7 x 6.6 cm. Endometrium Thickness: 0.8 cm. The large uterine fibroid displaces the endometrial echo complex, noted along the right posterior aspect of the uterus. Right ovary Not visualized. Left ovary Not visualized. Other findings:  No abnormal free fluid. IMPRESSION: 1. Large uterine fibroid noted, measuring 6.6 cm. The fibroid displaces the endometrial echo complex. Uterus otherwise unremarkable appearance. 2. Ovaries not visualized on this study. Evaluation is suboptimal due to the patient's habitus. Electronically  Signed   By: Garald Balding M.D.   On: 09/19/2018 22:33    Assessment/Plan: AUB with 6.6 cm uterine fibroid with decreased HB/HCT over the last few days. She has responded to Megace previously and this should continue. I spoke to Mr. Yevonne Pax and he will order Premarin 25 mg IV to decrease the bleeding acutely. Gyn attending can be reached at [phone]. Thank you  Emeterio Reeve 09/20/2018  Patient ID: Andrea [name], female   DOB: 1961-10-24, 57 y.o.   MRN: [id number]

## 2018-09-20 NOTE — Progress Notes (Signed)
STROKE TEAM PROGRESS NOTE   SUBJECTIVE (INTERVAL HISTORY) RN is at bedside. Pt vaginal bleeding seems to decreased overnight. As per RN, this is her menstrual period. Pt awake and interactive. No complains. No acute issue overnight. OBGYN consulted and recommend IV estrogen one dose and continue megace. Hb 7.7, will continue to monitor. Put back on iron pills.   OBJECTIVE Vitals:   09/20/18 0400 09/20/18 0558 09/20/18 0752 09/20/18 0804  BP: 119/73 132/65 132/65 (!) 103/59  Pulse: 83  72 73  Resp: 16  17 18   Temp: 98.3 F (36.8 C)  98.3 F (36.8 C)   TempSrc: Oral  Oral   SpO2: 93%  97% 96%  Weight:      Height:        CBC:  Recent Labs  Lab 09/19/18 0901 09/19/18 1625 09/20/18 0323  WBC 9.6  --  9.5  NEUTROABS  --   --  6.7  HGB 9.6* 9.0* 7.7*  HCT 33.6* 29.9* 26.5*  MCV 86.8  --  85.5  PLT 579*  --  503*    Basic Metabolic Panel:  Recent Labs  Lab 09/19/18 0901 09/20/18 0323  NA 134* 133*  K 4.5 4.6  CL 104 105  CO2 22 22  GLUCOSE 111* 143*  BUN 26* 35*  CREATININE 0.73 0.85  CALCIUM 10.3 10.1  PHOS  --  3.5    Lipid Panel:     Component Value Date/Time   CHOL 256 (H) 08/15/2018 0512   TRIG 248 (H) 08/15/2018 0512   HDL 35 (L) 08/15/2018 0512   CHOLHDL 7.3 08/15/2018 0512   VLDL 50 (H) 08/15/2018 0512   LDLCALC 171 (H) 08/15/2018 0512   HgbA1c:  Lab Results  Component Value Date   HGBA1C 11.4 (H) 08/15/2018   Urine Drug Screen:     Component Value Date/Time   LABOPIA NONE DETECTED 08/07/2018 1836   COCAINSCRNUR NONE DETECTED 08/10/2018 1836   LABBENZ NONE DETECTED 08/20/2018 1836   AMPHETMU NONE DETECTED 08/19/2018 1836   THCU NONE DETECTED 08/16/2018 1836   LABBARB NONE DETECTED 08/28/2018 1836    Alcohol Level No results found for: ETH  IMAGING  Ct Angio Head W Or Wo Contrast Ct Angio Neck W Or Wo Contrast 08/15/2018 IMPRESSION:  1. Interval basilar to left proximal PCA stenting. The density of the stent walls precludes detection  of in stent stenosis; there is a degree of wasting at the mid basilar segment. There is flow in the left PCA beyond the stent such that there is presumed stent patency.  2. Known lower pontine infarct. There are new small cerebellar infarcts since brain MRI yesterday.  3. Severe left P2 segment stenosis, also seen previously.  4. Moderate atheromatous narrowing in the proximal left MCA.    Ct Angio Head W Or Wo Contrast Ct Angio Neck W Or Wo Contrast 08/31/2018 IMPRESSION:  1. 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 No definite acute thrombus identified.  2. Severe stenosis left posterior cerebral artery and moderate stenosis right posterior cerebral artery.  3. Mild stenosis left MCA and moderate stenosis left MCA bifurcation.  4. No significant carotid or vertebral artery stenosis in the neck.    Ct Head Wo Contrast 08/31/2018 IMPRESSION:  No acute intracranial abnormalities. Mild white matter changes likely due to small vessel ischemia.    Mr Brain Wo Contrast 09/02/2018 IMPRESSION:  1. Acute nonhemorrhagic infarct involving the left paramedian brainstem. The  infarct crosses midline.  2. Other periventricular and subcortical white matter disease is moderately advanced for age. This likely reflects the sequela of chronic microvascular ischemia.  3. Tapering of the dens with prominent soft tissue pannus. 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    Ct Head Code Stroke Wo Contrast 08/11/2018  IMPRESSION:  1. No acute abnormality and no change from earlier today  2. ASPECTS is 10 3.    Cerebral Angiogram 08/19/2018 S/P 4 vessel cerebral arteriogram RT CFA approach. 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 S/P stent assisted angioplasty of distal basilar artery with patency of 80 to 90 %. 2.Approx 70 %  stenosis of LT ICA supraclinoid seg   MRI 08/15/18 1. Progressive acute pontine infarct. New patchy bilateral cerebellar infarction. New tiny left thalamic infarcts. 2. Preserved flow void in the stented basilar. 3. left facial/submandibular edema, please correlate with neck exam. Edited result: IMPRESSION: 1. Progressive acute pontine infarct. New patchy bilateral cerebellar infarction. New tiny left thalamic infarcts. 2. Preserved flow void in the stented basilar.  TTE - Left ventricle: The cavity size was normal. There was severe   concentric hypertrophy. Systolic function was normal. The   estimated ejection fraction was in the range of 60% to 65%. Wall   motion was normal; there were no regional wall motion   abnormalities. Doppler parameters are consistent with abnormal   left ventricular relaxation (grade 1 diastolic dysfunction). The   E/e&' ratio is <8, suggesting normal LV filling pressure. - Aortic valve: Trileaflet; mildly calcified leaflets.   Transvalvular velocity was minimally increased. There was no   regurgitation. Mean gradient (S): 12 mm Hg. - Mitral valve: Mildly thickened leaflets . There was trivial   regurgitation. - Left atrium: The atrium was normal in size. - Inferior vena cava: The vessel was dilated. The respirophasic   diameter changes were blunted (< 50%), consistent with elevated   central venous pressure. Impressions: - LVEF 60-65%, severe LVH, normal wall motion, grade 1 DD, normal   LV filling pressure, minimally increased aortic velocity without   signficant stenosis, trivial MR, normal LA size, dilated IVC.  CT abdomen and pelvis 09/03/18 Mild bilateral posterior basilar subsegmental atelectasis. Interval placement of new gastrostomy tube with tip and balloon within gastric lumen. Surgical drain is seen in the soft tissues in previous gastrostomy site. Small amount of gas and stranding is seen in the peritoneal fat anterior and inferior to  the stomach which most likely is related to gastrostomy placement. Probable surgical wound seen in right upper quadrant of the anterior abdominal wall. 8.2 x 2.3 cm gas and fluid collection is seen in the anterior abdominal wall which is unchanged compared to prior exam. Possible abscess cannot be excluded. Enlarged fibroid uterus.  PHYSICAL EXAM  Temp:  [98 F (36.7 C)-98.6 F (37 C)] 98.3 F (36.8 C) (11/16 0752) Pulse Rate:  [72-86] 73 (11/16 0804) Resp:  [16-23] 18 (11/16 0804) BP: (102-144)/(59-80) 103/59 (11/16 0804) SpO2:  [93 %-100 %] 96 % (11/16 0804) FiO2 (%):  [28 %] 28 % (11/16 0804)  General -  Obese middle-aged African-American lady, on trach collar, not in distress. She has abdominal surgical wound and drainage from the abdominal wall, dressing clean  Ophthalmologic - fundi not visualized due to noncooperation.  Cardiovascular - regular rate and rhythm  Neuro - on trach collar, eyes open, awake, alert. No ptosis  or EOMI, denies diplopia.  PERRL. Bilateral facial weakness, tongue protrusion weak. Able to wiggle left hand fingers and left foot toes. 2/5 LLE and 2-/5 LUE. Follows commands and good eye contact.  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   Dense right hemiplegia persists.  ASSESSMENT/PLAN Andrea [name] is a 57 y.o. female with history of difficult to control hypertension, insulin-dependent diabetes, obesity, hypothyroidism status post ablation presenting with chest discomfort, right-sided weakness, slurred speech and right facial droop. She did not receive IV t-PA due to late presentation. S/P stent assisted angioplasty of distal basilar artery.  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 Worsening symptoms with extension of pontine/medullary infarcts with new small bilateral cerebellar infarcts without evidence of stent re-stenosis or occlusion  Resultant quadriplegia, on  trach and peg  CT head - No acute intracranial abnormalities.  CTA H&N 08/20/2018 - Extensive atherosclerotic disease in the basilar with focal critical stenosis in the mid to distal basilar. No definite acute thrombus identified. Severe stenosis left posterior cerebral artery  MRI head - Acute nonhemorrhagic infarct involving the left paramedian brainstem.   CTA H&N 08/15/2018 - stent too dense to see BA lumen, but distal flow preserved. New small cerebellar infarcts since brain MRI yesterday.  Severe left P2 segment stenosis, also seen previously.   Repeat MRI - extension of b/l pontine and upper medullary infarcts with new b/l cerebellar infarcts.  2D Echo - EF 60-65%  LDL - 174  HgbA1c - 11.1  VTE prophylaxis - heparin subq  aspirin 81 mg daily prior to admission, now resumed on Brilinta 90 mg bid and ASA 81 mg daily.   Patient counseled to be compliant with her antithrombotic medications  Ongoing aggressive stroke risk factor management  Therapy recommendations:  LTACH  Disposition:  SNF  Abnormal uterine bleeding, improved  Has been following with GYN  Was on megace in the past  CT abd/pelvis 09/03/18 - enlarged fibroid uterus  continue megace 80mg  bid  Continue ASA and brilinta for now  OBGYN recs appreciated  Will give prmarin 25mg  IV as recommended  Close CBC monitoring  Anemia   Hb 6.8->PRBC->7.1->6.8->8.2->8.4->8.9->8.6->9.0-7.7  Likely due to iron deficiency, heavy periods and acute uterine bleeding and recent surgeries  Iron panel showed iron deficiency  On iron pills  PRBC transfusion 1 U 09/07/2018 and 2U 09/07/18  Currently on her menstrual cycle  CBC monitoring  Intracranial stenosis  BA mid to distal severe stenosis s/p BA stenting  Left PCA severe stenosis, R PCA moderate stenosis  Left MCA moderate stenosis  Uncontrolled stroke risk factors with HLD, HTN, DM  Non compliance with meds at home  Respiratory failure  S/p  trach 08/29/2018  On trach collar now  Working with speech therapist for speaking valve  Hypertension  Stable on the low end  BP goal 130-150   On Coreg 25 bid, and hydralazine 25mg  tid  D/c amlodipine  Close monitoring  Hyperlipidemia  Lipid lowering medication PTA:  Lipitor 20 mg daily  LDL 174, goal < 70  Increased to 80 mg daily  Continue statin at discharge  Diabetes  HgbA1c 11.4, goal < 7.0  Uncontrolled at home  Glucose stable on insulin   Decrease Lantus again today to 18U bid   Decrease basal NovoLog again today to 4U Q4h  SSI  CBG monitoring  Low grade fever with leukocytosis, resolved  Tmax 101.6 -> afebrile  Leukocytosis -  14.0->12.1->9.9->11.2->11.5->11.3->9.5  UA WBC 21-50  CXR unremarkable, improving from prior  Blood cultures negative 10/26   Respiratory cultures - mixed flora   Abdominal abscess culture showed proteus mirabilis  Off antibiotics  Hypernatremia -> hyponatremia  Na 149->148->150->147->144->143->134->133  Cre 1.31->1.09->0.86->0.80->0.73->0.73->0.85  On TF @ 45  D/c free water  Put on NS @ 40  Continue BMP monitoring  Dysphagia s/p PEG complicated by abdominal wall abscess  Continue tube feeding @ 45 cc/h  PEG done 08/27/2018  Removed and replaced dislodged PEG 08/25/2018  CT showed Abdominal wall abscess - s/p open surgical drainage 08/22/2018 persistent foul odor discharge. Reexploration 10/01/2018  Culture showed peroteus mirabilis  Off meropenum now   Trauma surgery following, appreciate recs  Other Stroke Risk Factors  Obesity, Body mass index is 46.55 kg/m., recommend weight loss, diet and exercise as appropriate   Other Active Problems  Hypokalemia - resolved  Thrombocytosis - likely due to anemia  Hospital day # 37  This patient is critically ill due to worsening brainstem infarct, BA stenosis s/p stenting, hyperglycemia, hypertensive, fever, anemia, abdominal wall abscess s/p drainage,  dislodged PEG and hypernatremiaand at significant risk of neurological worsening, death form recurrent stroke, hemorrhagic conversion, CHF, seizure, aspiration, hemorrhagic shock. This patient's care requires constant monitoring of vital signs, hemodynamics, respiratory and cardiac monitoring, review of multiple databases, neurological assessment, discussion with family, other specialists and medical decision making of high complexity. I spent 30 minutes of neurocritical care time in the care of this patient.   Rosalin Hawking, MD PhD Stroke Neurology 09/20/2018 10:49 AM  To contact Stroke Continuity provider, please refer to [email or web address] After hours, contact General Neurology

## 2018-09-21 DIAGNOSIS — R252 Cramp and spasm: Secondary | ICD-10-CM

## 2018-09-21 LAB — BASIC METABOLIC PANEL
Anion gap: 6 (ref 5–15)
BUN: 35 mg/dL — ABNORMAL HIGH (ref 6–20)
CALCIUM: 9.8 mg/dL (ref 8.9–10.3)
CO2: 24 mmol/L (ref 22–32)
Chloride: 106 mmol/L (ref 98–111)
Creatinine, Ser: 0.77 mg/dL (ref 0.44–1.00)
GFR calc Af Amer: >60 mL/min (ref >60)
GLUCOSE: 132 mg/dL — AB (ref 70–99)
Potassium: 4.5 mmol/L (ref 3.5–5.1)
Sodium: 136 mmol/L (ref 135–145)

## 2018-09-21 LAB — CBC
HCT: 23.1 % — ABNORMAL LOW (ref 36.0–46.0)
Hemoglobin: 7 g/dL — ABNORMAL LOW (ref 12.0–15.0)
MCH: 25.6 pg — AB (ref 26.0–34.0)
MCHC: 30.3 g/dL (ref 30.0–36.0)
MCV: 84.6 fL (ref 80.0–100.0)
PLATELETS: 402 10*3/uL — AB (ref 150–400)
RBC: 2.73 MIL/uL — ABNORMAL LOW (ref 3.87–5.11)
RDW: 17.5 % — ABNORMAL HIGH (ref 11.5–15.5)
WBC: 10.9 10*3/uL — ABNORMAL HIGH (ref 4.0–10.5)
nRBC: 0 % (ref 0.0–0.2)

## 2018-09-21 LAB — GLUCOSE, CAPILLARY
GLUCOSE-CAPILLARY: 138 mg/dL — AB (ref 70–99)
GLUCOSE-CAPILLARY: 123 mg/dL — AB (ref 70–99)
GLUCOSE-CAPILLARY: 86 mg/dL (ref 70–99)
Glucose-Capillary: 104 mg/dL — ABNORMAL HIGH (ref 70–99)
Glucose-Capillary: 96 mg/dL (ref 70–99)

## 2018-09-21 LAB — PREPARE RBC (CROSSMATCH)

## 2018-09-21 MED ORDER — SODIUM CHLORIDE 3 % IN NEBU: 4.0000 mL | INHALATION_SOLUTION | Freq: Every day | RESPIRATORY_TRACT | Status: DC

## 2018-09-21 MED ORDER — SODIUM CHLORIDE 0.9% IV SOLUTION: Freq: Once | INTRAVENOUS | Status: DC

## 2018-09-21 MED ORDER — SODIUM CHLORIDE 3 % IN NEBU
4.0000 mL | INHALATION_SOLUTION | Freq: Three times a day (TID) | RESPIRATORY_TRACT | Status: DC
  Administered 2018-09-21 – 2018-09-26 (×13): 4 mL via RESPIRATORY_TRACT
  Filled 2018-09-21 (×15): qty 4

## 2018-09-21 MED ORDER — FERROUS SULFATE 300 (60 FE) MG/5ML PO SYRP
300.0000 mg | ORAL_SOLUTION | Freq: Three times a day (TID) | ORAL | Status: DC
  Administered 2018-09-21 – 2018-10-14 (×66): 300 mg via ORAL
  Filled 2018-09-21 (×67): qty 5

## 2018-09-21 NOTE — Progress Notes (Signed)
The patient is refusing suctioning and cleaning of her inner cannula.

## 2018-09-21 NOTE — Progress Notes (Signed)
Overnight, pt's bleeding decreased significantly from previous night. No clots seen.

## 2018-09-21 NOTE — Progress Notes (Signed)
Pt had agreed to take pain medication, upon taking her medication into the room, pt again refused to take pain medication or any other medication (sched Coreg or mouth swab).   Pt had stated she wanted her kids, and wanted to go home. Pt agreed for nurse to call her sister.  Call placed earlier to her sister and her sister, Andrea [name] called back.  Andrea [name] was informed of Andrea [name] refusing to allow nursing to give her medications, refusing to have her trach suctioned. Also informed Andrea [name] of MD orders for blood transfusion today once blood ready, concerned that pt may refuse blood transfusion also. Andrea [name] states she will be coming to hospital around 7-8pm tonight and will talk with her.  She will also try to call her son, Andrea [name] and have him come see her.  Will notify Dr Erlinda Hong of the above.

## 2018-09-21 NOTE — Progress Notes (Signed)
Patient is refusing any type of care.  She stated we are trying to harm her and knock her out with medicine.  Patient refused to allow nursing to change her abdomen dressing.  Patient stated she wanted to go home.

## 2018-09-21 NOTE — Progress Notes (Signed)
Pt family "godmother" came in to see pt, was able to talk to pt and pt finally allowed staff to give her scheduled meds, pain medication and do her dressing change. (RT was in before this and was able to do trach care).   Message was sent to Dr Erlinda Hong at approx 1740 informing him of pt refusal of meds, trach care and wound care/dressing change--no response from him after message sent, but pt has now allowed staff to do the care for her that is necessary.  Next shift will re-discuss blood transfusion with pt, as she had refused this earlier while waiting for blood to be typed and screened and be ready for transfusion.

## 2018-09-21 NOTE — Progress Notes (Signed)
Message sent via text pager to Dr Erlinda Hong --pt's hemoglobin 7.0.

## 2018-09-21 NOTE — Progress Notes (Addendum)
STROKE TEAM PROGRESS NOTE   SUBJECTIVE (INTERVAL HISTORY) RN and RT are at bedside. Pt vaginal bleeding much improved overnight, likely due to estrogen. She still has copious secretions, will order 3% saline nebs. CBC and BMP pending today, no iron solutions.   OBJECTIVE Vitals:   09/21/18 0002 09/21/18 0356 09/21/18 0510 09/21/18 0800  BP:   117/71 113/68  Pulse: 80 94 84 97  Resp: 20 12 (!) 24 15  Temp:   98.6 F (37 C)   TempSrc:   Oral   SpO2: 98% 100% 99% 97%  Weight:      Height:        CBC:  Recent Labs  Lab 09/19/18 0901 09/19/18 1625 09/20/18 0323  WBC 9.6  --  9.5  NEUTROABS  --   --  6.7  HGB 9.6* 9.0* 7.7*  HCT 33.6* 29.9* 26.5*  MCV 86.8  --  85.5  PLT 579*  --  503*    Basic Metabolic Panel:  Recent Labs  Lab 09/19/18 0901 09/20/18 0323  NA 134* 133*  K 4.5 4.6  CL 104 105  CO2 22 22  GLUCOSE 111* 143*  BUN 26* 35*  CREATININE 0.73 0.85  CALCIUM 10.3 10.1  PHOS  --  3.5    Lipid Panel:     Component Value Date/Time   CHOL 256 (H) 08/15/2018 0512   TRIG 248 (H) 08/15/2018 0512   HDL 35 (L) 08/15/2018 0512   CHOLHDL 7.3 08/15/2018 0512   VLDL 50 (H) 08/15/2018 0512   LDLCALC 171 (H) 08/15/2018 0512   HgbA1c:  Lab Results  Component Value Date   HGBA1C 11.4 (H) 08/15/2018   Urine Drug Screen:     Component Value Date/Time   LABOPIA NONE DETECTED 08/20/2018 1836   COCAINSCRNUR NONE DETECTED 08/08/2018 1836   LABBENZ NONE DETECTED 09/01/2018 1836   AMPHETMU NONE DETECTED 08/07/2018 1836   THCU NONE DETECTED 08/07/2018 1836   LABBARB NONE DETECTED 08/13/2018 1836    Alcohol Level No results found for: ETH  IMAGING  Ct Angio Head W Or Wo Contrast Ct Angio Neck W Or Wo Contrast 08/15/2018 IMPRESSION:  1. Interval basilar to left proximal PCA stenting. The density of the stent walls precludes detection of in stent stenosis; there is a degree of wasting at the mid basilar segment. There is flow in the left PCA beyond the stent such  that there is presumed stent patency.  2. Known lower pontine infarct. There are new small cerebellar infarcts since brain MRI yesterday.  3. Severe left P2 segment stenosis, also seen previously.  4. Moderate atheromatous narrowing in the proximal left MCA.    Ct Angio Head W Or Wo Contrast Ct Angio Neck W Or Wo Contrast 08/09/2018 IMPRESSION:  1. 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 No definite acute thrombus identified.  2. Severe stenosis left posterior cerebral artery and moderate stenosis right posterior cerebral artery.  3. Mild stenosis left MCA and moderate stenosis left MCA bifurcation.  4. No significant carotid or vertebral artery stenosis in the neck.    Ct Head Wo Contrast 09/02/2018 IMPRESSION:  No acute intracranial abnormalities. Mild white matter changes likely due to small vessel ischemia.    Mr Brain Wo Contrast 08/12/2018 IMPRESSION:  1. Acute nonhemorrhagic infarct involving the left paramedian brainstem. The infarct crosses midline.  2. Other periventricular and subcortical white matter disease is moderately advanced for age. This likely reflects  the sequela of chronic microvascular ischemia.  3. Tapering of the dens with prominent soft tissue pannus. 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    Ct Head Code Stroke Wo Contrast 08/21/2018  IMPRESSION:  1. No acute abnormality and no change from earlier today  2. ASPECTS is 10 3.    Cerebral Angiogram 08/13/2018 S/P 4 vessel cerebral arteriogram RT CFA approach. 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 S/P stent assisted angioplasty of distal basilar artery with patency of 80 to 90 %. 2.Approx 70 % stenosis of LT ICA supraclinoid seg   MRI 08/15/18 1. Progressive acute pontine infarct. New patchy bilateral cerebellar  infarction. New tiny left thalamic infarcts. 2. Preserved flow void in the stented basilar. 3. left facial/submandibular edema, please correlate with neck exam. Edited result: IMPRESSION: 1. Progressive acute pontine infarct. New patchy bilateral cerebellar infarction. New tiny left thalamic infarcts. 2. Preserved flow void in the stented basilar.  TTE - Left ventricle: The cavity size was normal. There was severe   concentric hypertrophy. Systolic function was normal. The   estimated ejection fraction was in the range of 60% to 65%. Wall   motion was normal; there were no regional wall motion   abnormalities. Doppler parameters are consistent with abnormal   left ventricular relaxation (grade 1 diastolic dysfunction). The   E/e&' ratio is <8, suggesting normal LV filling pressure. - Aortic valve: Trileaflet; mildly calcified leaflets.   Transvalvular velocity was minimally increased. There was no   regurgitation. Mean gradient (S): 12 mm Hg. - Mitral valve: Mildly thickened leaflets . There was trivial   regurgitation. - Left atrium: The atrium was normal in size. - Inferior vena cava: The vessel was dilated. The respirophasic   diameter changes were blunted (< 50%), consistent with elevated   central venous pressure. Impressions: - LVEF 60-65%, severe LVH, normal wall motion, grade 1 DD, normal   LV filling pressure, minimally increased aortic velocity without   signficant stenosis, trivial MR, normal LA size, dilated IVC.  CT abdomen and pelvis 09/03/18 Mild bilateral posterior basilar subsegmental atelectasis. Interval placement of new gastrostomy tube with tip and balloon within gastric lumen. Surgical drain is seen in the soft tissues in previous gastrostomy site. Small amount of gas and stranding is seen in the peritoneal fat anterior and inferior to the stomach which most likely is related to gastrostomy placement. Probable surgical wound seen in right upper quadrant of  the anterior abdominal wall. 8.2 x 2.3 cm gas and fluid collection is seen in the anterior abdominal wall which is unchanged compared to prior exam. Possible abscess cannot be excluded. Enlarged fibroid uterus.  PHYSICAL EXAM  Temp:  [98.2 F (36.8 C)-98.6 F (37 C)] 98.6 F (37 C) (11/17 0510) Pulse Rate:  [68-97] 97 (11/17 0800) Resp:  [12-24] 15 (11/17 0800) BP: (111-127)/(64-73) 113/68 (11/17 0800) SpO2:  [95 %-100 %] 97 % (11/17 0800) FiO2 (%):  [28 %] 28 % (11/17 0356)  General -  Obese middle-aged African-American lady, on trach collar, not in distress. She has abdominal surgical wound and drainage from the abdominal wall, dressing clean  Ophthalmologic - fundi not visualized due to noncooperation.  Cardiovascular - regular rate and rhythm  Neuro - on trach collar, eyes open, awake, alert. No ptosis or EOMI, denies diplopia.  PERRL. Bilateral facial weakness, tongue protrusion weak. Able to wiggle left hand fingers and left  foot toes. 2-/5 LLE and 2/5 LUE. Follows commands and good eye contact.  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   Dense right hemiplegia persists.  ASSESSMENT/PLAN Ms. BABE ANTHIS is a 57 y.o. female with history of difficult to control hypertension, insulin-dependent diabetes, obesity, hypothyroidism status post ablation presenting with chest discomfort, right-sided weakness, slurred speech and right facial droop. She did not receive IV t-PA due to late presentation. S/P stent assisted angioplasty of distal basilar artery.  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 Worsening symptoms with extension of pontine/medullary infarcts with new small bilateral cerebellar infarcts without evidence of stent re-stenosis or occlusion  Resultant quadriplegia, on trach and peg  CT head - No acute intracranial abnormalities.  CTA H&N 09/03/2018 - Extensive atherosclerotic disease in  the basilar with focal critical stenosis in the mid to distal basilar. No definite acute thrombus identified. Severe stenosis left posterior cerebral artery  MRI head - Acute nonhemorrhagic infarct involving the left paramedian brainstem.   CTA H&N 08/15/2018 - stent too dense to see BA lumen, but distal flow preserved. New small cerebellar infarcts since brain MRI yesterday.  Severe left P2 segment stenosis, also seen previously.   Repeat MRI - extension of b/l pontine and upper medullary infarcts with new b/l cerebellar infarcts.  2D Echo - EF 60-65%  LDL - 174  HgbA1c - 11.1  VTE prophylaxis - heparin subq  aspirin 81 mg daily prior to admission, now resumed on Brilinta 90 mg bid and ASA 81 mg daily.   Patient counseled to be compliant with her antithrombotic medications  Ongoing aggressive stroke risk factor management  Therapy recommendations:  LTACH  Disposition:  SNF  Abnormal uterine bleeding, improved  Has been following with GYN  Was on megace in the past  CT abd/pelvis 09/03/18 - enlarged fibroid uterus  continue megace 80mg  bid  Continue ASA and brilinta for now  OBGYN recs appreciated  Gave prmarin 25mg  IV as recommended - vaginal bleeding much improved  Close CBC monitoring  Anemia   Hb 6.8->PRBC->7.1->6.8->8.2->8.4->8.9->8.6->9.0-7.7->pending  Likely due to iron deficiency, heavy periods and acute uterine bleeding and recent surgeries  Iron panel showed iron deficiency  On iron solutions  PRBC transfusion 1 U 09/07/2018 and 2U 09/07/18  CBC monitoring  Intracranial stenosis  BA mid to distal severe stenosis s/p BA stenting  Left PCA severe stenosis, R PCA moderate stenosis  Left MCA moderate stenosis  Uncontrolled stroke risk factors with HLD, HTN, DM  Non compliance with meds at home  Respiratory failure  S/p trach 08/22/2018  On trach collar now  Working with speech therapist for speaking valve  Hypertension  Still on the  low end  BP goal 130-150   On Coreg 25 bid  D/c amlodipine yesterday and hydralazine today  Close monitoring  Hyperlipidemia  Lipid lowering medication PTA:  Lipitor 20 mg daily  LDL 174, goal < 70  Increased to 80 mg daily  Continue statin at discharge  Diabetes  HgbA1c 11.4, goal < 7.0  Uncontrolled at home  Glucose stable on insulin   Decrease Lantus again today to 18U bid   Decrease basal NovoLog again today to 4U Q4h  SSI  CBG monitoring  Low grade fever with leukocytosis, resolved  Tmax 101.6 -> afebrile  Leukocytosis - 14.0->12.1->9.9->11.2->11.5->11.3->9.5  UA WBC 21-50  CXR unremarkable, improving from prior  Blood cultures negative 10/26   Respiratory cultures - mixed  flora   Abdominal abscess culture showed proteus mirabilis  Off antibiotics  Hypernatremia -> hyponatremia  Na 149->148->150->147->144->143->134->133  Cre 1.31->1.09->0.86->0.80->0.73->0.73->0.85  On TF @ 45  D/c free water  Put on NS @ 40  Continue BMP monitoring  Dysphagia s/p PEG complicated by abdominal wall abscess  Continue tube feeding @ 45 cc/h  PEG done 08/06/2018  Removed and replaced dislodged PEG 08/19/2018  CT showed Abdominal wall abscess - s/p open surgical drainage 08/13/2018 persistent foul odor discharge. Reexploration 09/17/2018  Culture showed peroteus mirabilis  Off meropenum now   Trauma surgery following, appreciate recs  Other Stroke Risk Factors  Obesity, Body mass index is 46.55 kg/m., recommend weight loss, diet and exercise as appropriate   Other Active Problems  Hypokalemia - resolved  Thrombocytosis - likely due to anemia  Hospital day # 35  Andrea Hawking, MD PhD Stroke Neurology 09/21/2018 11:57 AM   To contact Stroke Continuity provider, please refer to [email or web address] After hours, contact General Neurology

## 2018-09-22 LAB — GLUCOSE, CAPILLARY
GLUCOSE-CAPILLARY: 95 mg/dL (ref 70–99)
GLUCOSE-CAPILLARY: 92 mg/dL (ref 70–99)
GLUCOSE-CAPILLARY: 88 mg/dL (ref 70–99)
Glucose-Capillary: 115 mg/dL — ABNORMAL HIGH (ref 70–99)
Glucose-Capillary: 129 mg/dL — ABNORMAL HIGH (ref 70–99)
Glucose-Capillary: 133 mg/dL — ABNORMAL HIGH (ref 70–99)
Glucose-Capillary: 95 mg/dL (ref 70–99)

## 2018-09-22 LAB — CBC
HCT: 28 % — ABNORMAL LOW (ref 36.0–46.0)
Hemoglobin: 8.6 g/dL — ABNORMAL LOW (ref 12.0–15.0)
MCH: 26.3 pg (ref 26.0–34.0)
MCHC: 30.7 g/dL (ref 30.0–36.0)
MCV: 85.6 fL (ref 80.0–100.0)
NRBC: 0.2 % (ref 0.0–0.2)
Platelets: 395 10*3/uL (ref 150–400)
RBC: 3.27 MIL/uL — AB (ref 3.87–5.11)
RDW: 17.6 % — AB (ref 11.5–15.5)
WBC: 9.9 10*3/uL (ref 4.0–10.5)

## 2018-09-22 LAB — BASIC METABOLIC PANEL
ANION GAP: 4 — AB (ref 5–15)
BUN: 30 mg/dL — ABNORMAL HIGH (ref 6–20)
CO2: 25 mmol/L (ref 22–32)
CREATININE: 0.73 mg/dL (ref 0.44–1.00)
Calcium: 9.9 mg/dL (ref 8.9–10.3)
Chloride: 108 mmol/L (ref 98–111)
Glucose, Bld: 116 mg/dL — ABNORMAL HIGH (ref 70–99)
Potassium: 4.5 mmol/L (ref 3.5–5.1)
SODIUM: 137 mmol/L (ref 135–145)

## 2018-09-22 MED ORDER — AMLODIPINE BESYLATE 10 MG PO TABS
10.0000 mg | ORAL_TABLET | Freq: Every day | ORAL | Status: DC
  Administered 2018-09-22 – 2018-10-14 (×23): 10 mg via ORAL
  Filled 2018-09-22 (×23): qty 1

## 2018-09-22 MED ORDER — HYDRALAZINE HCL 25 MG PO TABS
25.0000 mg | ORAL_TABLET | Freq: Three times a day (TID) | ORAL | Status: DC
  Administered 2018-09-22 – 2018-10-14 (×62): 25 mg via ORAL
  Filled 2018-09-22 (×64): qty 1

## 2018-09-22 NOTE — Progress Notes (Signed)
STROKE TEAM PROGRESS NOTE   SUBJECTIVE (INTERVAL HISTORY) Pt OT is at bedside. Pt vaginal bleeding continues to improve. Her Hb yesterday 7.0, received 2 unit PRBC transfusion.  This morning hemoglobin 8.6.  Sodium today 137.  We will continue normal saline.  BP on the higher side, resume Norvasc and hydralazine.  OBJECTIVE Vitals:   09/22/18 1609 09/22/18 1929 09/22/18 1943 09/22/18 2304  BP:  (!) 186/74 (!) 186/74 (!) 184/69  Pulse: 88 81 82   Resp:  (!) 22 18   Temp: 98.6 F (37 C) 99.4 F (37.4 C)  99.2 F (37.3 C)  TempSrc: Oral Oral  Axillary  SpO2:   96%   Weight:      Height:        CBC:  Recent Labs  Lab 09/20/18 0323 09/21/18 1217 09/22/18 0638  WBC 9.5 10.9* 9.9  NEUTROABS 6.7  --   --   HGB 7.7* 7.0* 8.6*  HCT 26.5* 23.1* 28.0*  MCV 85.5 84.6 85.6  PLT 503* 402* 625    Basic Metabolic Panel:  Recent Labs  Lab 09/20/18 0323 09/21/18 1217 09/22/18 0638  NA 133* 136 137  K 4.6 4.5 4.5  CL 105 106 108  CO2 22 24 25   GLUCOSE 143* 132* 116*  BUN 35* 35* 30*  CREATININE 0.85 0.77 0.73  CALCIUM 10.1 9.8 9.9  PHOS 3.5  --   --     Lipid Panel:     Component Value Date/Time   CHOL 256 (H) 08/15/2018 0512   TRIG 248 (H) 08/15/2018 0512   HDL 35 (L) 08/15/2018 0512   CHOLHDL 7.3 08/15/2018 0512   VLDL 50 (H) 08/15/2018 0512   LDLCALC 171 (H) 08/15/2018 0512   HgbA1c:  Lab Results  Component Value Date   HGBA1C 11.4 (H) 08/15/2018   Urine Drug Screen:     Component Value Date/Time   LABOPIA NONE DETECTED 08/07/2018 1836   COCAINSCRNUR NONE DETECTED 08/08/2018 1836   LABBENZ NONE DETECTED 08/21/2018 1836   AMPHETMU NONE DETECTED 08/27/2018 1836   THCU NONE DETECTED 08/25/2018 1836   LABBARB NONE DETECTED 08/10/2018 1836    Alcohol Level No results found for: ETH  IMAGING  Ct Angio Head W Or Wo Contrast Ct Angio Neck W Or Wo Contrast 08/15/2018 IMPRESSION:  1. Interval basilar to left proximal PCA stenting. The density of the stent  walls precludes detection of in stent stenosis; there is a degree of wasting at the mid basilar segment. There is flow in the left PCA beyond the stent such that there is presumed stent patency.  2. Known lower pontine infarct. There are new small cerebellar infarcts since brain MRI yesterday.  3. Severe left P2 segment stenosis, also seen previously.  4. Moderate atheromatous narrowing in the proximal left MCA.    Ct Angio Head W Or Wo Contrast Ct Angio Neck W Or Wo Contrast 08/09/2018 IMPRESSION:  1. 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 No definite acute thrombus identified.  2. Severe stenosis left posterior cerebral artery and moderate stenosis right posterior cerebral artery.  3. Mild stenosis left MCA and moderate stenosis left MCA bifurcation.  4. No significant carotid or vertebral artery stenosis in the neck.    Ct Head Wo Contrast 08/05/2018 IMPRESSION:  No acute intracranial abnormalities. Mild white matter changes likely due to small vessel ischemia.    Mr Brain Wo Contrast 08/10/2018 IMPRESSION:  1. Acute nonhemorrhagic infarct  involving the left paramedian brainstem. The infarct crosses midline.  2. Other periventricular and subcortical white matter disease is moderately advanced for age. This likely reflects the sequela of chronic microvascular ischemia.  3. Tapering of the dens with prominent soft tissue pannus. 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    Ct Head Code Stroke Wo Contrast 08/10/2018  IMPRESSION:  1. No acute abnormality and no change from earlier today  2. ASPECTS is 10 3.    Cerebral Angiogram 08/29/2018 S/P 4 vessel cerebral arteriogram RT CFA approach. 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 S/P stent assisted angioplasty of distal basilar artery with patency of  80 to 90 %. 2.Approx 70 % stenosis of LT ICA supraclinoid seg   MRI 08/15/18 1. Progressive acute pontine infarct. New patchy bilateral cerebellar infarction. New tiny left thalamic infarcts. 2. Preserved flow void in the stented basilar. 3. left facial/submandibular edema, please correlate with neck exam. Edited result: IMPRESSION: 1. Progressive acute pontine infarct. New patchy bilateral cerebellar infarction. New tiny left thalamic infarcts. 2. Preserved flow void in the stented basilar.  TTE - Left ventricle: The cavity size was normal. There was severe   concentric hypertrophy. Systolic function was normal. The   estimated ejection fraction was in the range of 60% to 65%. Wall   motion was normal; there were no regional wall motion   abnormalities. Doppler parameters are consistent with abnormal   left ventricular relaxation (grade 1 diastolic dysfunction). The   E/e&' ratio is <8, suggesting normal LV filling pressure. - Aortic valve: Trileaflet; mildly calcified leaflets.   Transvalvular velocity was minimally increased. There was no   regurgitation. Mean gradient (S): 12 mm Hg. - Mitral valve: Mildly thickened leaflets . There was trivial   regurgitation. - Left atrium: The atrium was normal in size. - Inferior vena cava: The vessel was dilated. The respirophasic   diameter changes were blunted (< 50%), consistent with elevated   central venous pressure. Impressions: - LVEF 60-65%, severe LVH, normal wall motion, grade 1 DD, normal   LV filling pressure, minimally increased aortic velocity without   signficant stenosis, trivial MR, normal LA size, dilated IVC.  CT abdomen and pelvis 09/03/18 Mild bilateral posterior basilar subsegmental atelectasis. Interval placement of new gastrostomy tube with tip and balloon within gastric lumen. Surgical drain is seen in the soft tissues in previous gastrostomy site. Small amount of gas and stranding is seen in the peritoneal  fat anterior and inferior to the stomach which most likely is related to gastrostomy placement. Probable surgical wound seen in right upper quadrant of the anterior abdominal wall. 8.2 x 2.3 cm gas and fluid collection is seen in the anterior abdominal wall which is unchanged compared to prior exam. Possible abscess cannot be excluded. Enlarged fibroid uterus.  PHYSICAL EXAM  Temp:  [98.1 F (36.7 C)-99.9 F (37.7 C)] 99.2 F (37.3 C) (11/18 2304) Pulse Rate:  [76-90] 82 (11/18 1943) Resp:  [18-30] 18 (11/18 1943) BP: (134-186)/(65-81) 184/69 (11/18 2304) SpO2:  [96 %-100 %] 96 % (11/18 1943) FiO2 (%):  [28 %] 28 % (11/18 1943)  General -  Obese middle-aged African-American lady, on trach collar, not in distress. She has abdominal surgical wound and drainage from the abdominal wall, dressing clean  Ophthalmologic - fundi not visualized due to noncooperation.  Cardiovascular - regular rate and rhythm  Neuro - on trach collar,  eyes open, awake, alert. No ptosis or EOMI, denies diplopia.  PERRL. Bilateral facial weakness, tongue protrusion weak. Able to wiggle left hand fingers and left foot toes. 2-/5 LLE and 2/5 LUE. Follows commands and good eye contact.  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   Dense right hemiplegia persists.  ASSESSMENT/PLAN Andrea [name] is a 57 y.o. female with history of difficult to control hypertension, insulin-dependent diabetes, obesity, hypothyroidism status post ablation presenting with chest discomfort, right-sided weakness, slurred speech and right facial droop. She did not receive IV t-PA due to late presentation. S/P stent assisted angioplasty of distal basilar artery.  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 Worsening symptoms with extension of pontine/medullary infarcts with new small bilateral cerebellar infarcts without evidence of stent re-stenosis or  occlusion  Resultant quadriplegia, on trach and peg  CT head - No acute intracranial abnormalities.  CTA H&N 08/09/2018 - Extensive atherosclerotic disease in the basilar with focal critical stenosis in the mid to distal basilar. No definite acute thrombus identified. Severe stenosis left posterior cerebral artery  MRI head - Acute nonhemorrhagic infarct involving the left paramedian brainstem.   CTA H&N 08/15/2018 - stent too dense to see BA lumen, but distal flow preserved. New small cerebellar infarcts since brain MRI yesterday.  Severe left P2 segment stenosis, also seen previously.   Repeat MRI - extension of b/l pontine and upper medullary infarcts with new b/l cerebellar infarcts.  2D Echo - EF 60-65%  LDL - 174  HgbA1c - 11.1  VTE prophylaxis - heparin subq  aspirin 81 mg daily prior to admission, now resumed on Brilinta 90 mg bid and ASA 81 mg daily.   Patient counseled to be compliant with her antithrombotic medications  Ongoing aggressive stroke risk factor management  Therapy recommendations:  LTACH  Disposition:  SNF  Abnormal uterine bleeding, improved  Has been following with GYN  Was on megace in the past  CT abd/pelvis 09/03/18 - enlarged fibroid uterus  continue megace 80mg  bid  Continue ASA and brilinta for now  OBGYN recs appreciated  Gave prmarin 25mg  IV as recommended - vaginal bleeding much improved  Close CBC monitoring  Anemia   Hb 6.8->PRBC->7.1->6.8->8.2->8.4->8.9->8.6->9.0-7.7->7.0->8.6  Likely due to iron deficiency, heavy periods and acute uterine bleeding and recent surgeries  Iron panel showed iron deficiency  On iron solutions  PRBC transfusion 1 U 10/03/2018 and 2U 09/07/18 and 2U 09/21/18  CBC monitoring  Intracranial stenosis  BA mid to distal severe stenosis s/p BA stenting  Left PCA severe stenosis, R PCA moderate stenosis  Left MCA moderate stenosis  Uncontrolled stroke risk factors with HLD, HTN, DM  Non  compliance with meds at home  Respiratory failure  S/p trach 08/23/2018  On trach collar now  Working with speech therapist for speaking valve  Hypertension  High end  BP goal 130-150   On Coreg 25 bid  resume amlodipine and hydralazine  Close monitoring  Hyperlipidemia  Lipid lowering medication PTA:  Lipitor 20 mg daily  LDL 174, goal < 70  Increased to 80 mg daily  Continue statin at discharge  Diabetes  HgbA1c 11.4, goal < 7.0  Uncontrolled at home  Glucose stable on insulin   Decrease Lantus again today to 18U bid   Decrease basal NovoLog again today to 4U Q4h  SSI  CBG monitoring  Low grade fever with leukocytosis, resolved  Tmax 101.6 -> afebrile  Leukocytosis - 14.0->12.1->9.9->11.2->11.5->11.3->9.5->9.9  UA WBC 21-50  CXR unremarkable, improving from prior  Blood cultures negative 10/26   Respiratory cultures - mixed flora   Abdominal abscess culture showed proteus mirabilis  Off antibiotics  Hypernatremia -> hyponatremia  Na 149->148->150->147->144->143->134->133->137  Cre 1.31->1.09->0.86->0.80->0.73->0.73->0.85->0.73  On TF @ 45  D/c free water  continue on NS @ 40  Continue BMP monitoring  Dysphagia s/p PEG complicated by abdominal wall abscess  Continue tube feeding @ 45 cc/h  PEG done 08/28/2018  Removed and replaced dislodged PEG 09/04/2018  CT showed Abdominal wall abscess - s/p open surgical drainage 08/16/2018 persistent foul odor discharge. Reexploration 09/08/2018  Culture showed peroteus mirabilis  Off meropenum  Trauma surgery following, appreciate recs  Wound debrided appropriately and sutures removed.  No evisceration  Other Stroke Risk Factors  Obesity, Body mass index is 46.55 kg/m., recommend weight loss, diet and exercise as appropriate   Other Active Problems  Hypokalemia - resolved  Thrombocytosis - likely due to anemia, resolving  Hospital day # 60  Rosalin Hawking, MD PhD Stroke  Neurology 09/22/2018 11:17 PM   To contact Stroke Continuity provider, please refer to [email or web address] After hours, contact General Neurology

## 2018-09-22 NOTE — Social Work (Addendum)
Followed up with Andrea [name] who have offered bed on [facility].  The admissions liaison Andrea [name] was not aware of accepted offer. She will review with facility director Andrea [name] and let CSW know final determination for if they will accept Andrea [name]. Andrea [name] asked that CSW confirm that pt has a pending Medicaid number. Have followed up with financial counseling since it does indicate Medicaid pending on account maintenance.  1:11pm- received email back from Andrea [name] with pending Medicaid number #[id number] M  CSW continuing to follow.  Westley Hummer, MSW, [place] Work [phone]

## 2018-09-22 NOTE — Progress Notes (Signed)
Patient ID: Andrea [name], female   DOB: Aug 06, 1961, 57 y.o.   MRN: [id number]    17 Days Post-Op  Subjective: Pt on trach collar.  Opens her eyes to me but that's about it today.  Objective: Vital signs in last 24 hours: Temp:  [98.1 F (36.7 C)-99.4 F (37.4 C)] 98.1 F (36.7 C) (11/18 0705) Pulse Rate:  [20-96] 90 (11/18 0913) Resp:  [12-29] 20 (11/18 0913) BP: (110-184)/(59-85) 184/78 (11/18 0705) SpO2:  [96 %-100 %] 100 % (11/18 0913) FiO2 (%):  [28 %] 28 % (11/18 0913) Last BM Date: 09/20/18  Intake/Output from previous day: 11/17 0701 - 11/18 0700 In: 1065 [Blood:945] Out: -  Intake/Output this shift: No intake/output data recorded.  PE: Abd: wounds are stable.  Majority of sutures are removed today and some debridement of fibrinous tissue completely, making sure to not debride anything pink that could possibly be bowel.  Large amount of fibrin tissue removed from small old g-tube site.  All of these wounds were repacked after debridement completed.  Lab Results:  Recent Labs    09/21/18 1217 09/22/18 0638  WBC 10.9* 9.9  HGB 7.0* 8.6*  HCT 23.1* 28.0*  PLT 402* 395   BMET Recent Labs    09/21/18 1217 09/22/18 0638  NA 136 137  K 4.5 4.5  CL 106 108  CO2 24 25  GLUCOSE 132* 116*  BUN 35* 30*  CREATININE 0.77 0.73  CALCIUM 9.8 9.9   PT/INR No results for input(s): LABPROT, INR in the last 72 hours. CMP     Component Value Date/Time   NA 137 09/22/2018 0638   K 4.5 09/22/2018 0638   CL 108 09/22/2018 0638   CO2 25 09/22/2018 0638   GLUCOSE 116 (H) 09/22/2018 0638   BUN 30 (H) 09/22/2018 0638   CREATININE 0.73 09/22/2018 0638   CREATININE 0.91 04/09/2016 1213   CALCIUM 9.9 09/22/2018 0638   PROT 7.8 01/23/2018 1352   ALBUMIN 1.9 (L) 09/20/2018 0323   AST 14 (L) 01/23/2018 1352   ALT 11 (L) 01/23/2018 1352   ALKPHOS 92 01/23/2018 1352   BILITOT 0.8 01/23/2018 1352   GFRNONAA >60 09/22/2018 0638   GFRNONAA 71 04/09/2016 1213   GFRAA >60  09/22/2018 0638   GFRAA 82 04/09/2016 1213   Lipase     Component Value Date/Time   LIPASE 41 01/23/2018 1352       Studies/Results: No results found.  Anti-infectives: Anti-infectives (From admission, onward)   Start     Dose/Rate Route Frequency Ordered Stop   09/08/18 1400  meropenem (MERREM) 1 g in sodium chloride 0.9 % 100 mL IVPB     1 g 200 mL/hr over 30 Minutes Intravenous Every 8 hours 09/08/18 1311 09/13/18 2241   09/04/18 1600  cefTRIAXone (ROCEPHIN) 2 g in sodium chloride 0.9 % 100 mL IVPB  Status:  Discontinued     2 g 200 mL/hr over 30 Minutes Intravenous Every 24 hours 09/04/18 1437 09/08/18 1311   09/04/18 1600  metroNIDAZOLE (FLAGYL) IVPB 500 mg  Status:  Discontinued     500 mg 100 mL/hr over 60 Minutes Intravenous Every 8 hours 09/04/18 1437 09/08/18 1311   09/02/18 0600  vancomycin (VANCOCIN) 500 mg in sodium chloride 0.9 % 100 mL IVPB  Status:  Discontinued     500 mg 100 mL/hr over 60 Minutes Intravenous Every 12 hours 08/31/2018 1619 09/03/18 1025   08/30/18 1600  vancomycin (VANCOCIN) IVPB 750 mg/150 ml premix  Status:  Discontinued     750 mg 150 mL/hr over 60 Minutes Intravenous Every 12 hours 08/30/18 1334 08/08/2018 1619   08/29/18 2300  vancomycin (VANCOCIN) IVPB 750 mg/150 ml premix  Status:  Discontinued     750 mg 150 mL/hr over 60 Minutes Intravenous Every 12 hours 08/29/18 1223 08/29/18 1327   08/29/18 2300  vancomycin (VANCOCIN) IVPB 1000 mg/200 mL premix  Status:  Discontinued     1,000 mg 200 mL/hr over 60 Minutes Intravenous Every 12 hours 08/29/18 1327 08/30/18 1139   08/29/18 1800  cefTAZidime (FORTAZ) 2 g in sodium chloride 0.9 % 100 mL IVPB  Status:  Discontinued     2 g 200 mL/hr over 30 Minutes Intravenous Every 8 hours 08/29/18 1228 09/04/18 1437   08/26/18 2300  vancomycin (VANCOCIN) IVPB 1000 mg/200 mL premix  Status:  Discontinued     1,000 mg 200 mL/hr over 60 Minutes Intravenous Every 12 hours 08/26/18 0923 08/29/18 1223    08/26/18 1000  vancomycin (VANCOCIN) 2,500 mg in sodium chloride 0.9 % 500 mL IVPB     2,500 mg 250 mL/hr over 120 Minutes Intravenous  Once 08/26/18 0923 08/26/18 1900   08/26/18 1000  cefTAZidime (FORTAZ) 1 g in sodium chloride 0.9 % 100 mL IVPB  Status:  Discontinued     1 g 200 mL/hr over 30 Minutes Intravenous Every 8 hours 08/26/18 0923 08/29/18 1228   08/25/2018 1014  ceFAZolin (ANCEF) 2-4 GM/100ML-% IVPB    Note to Pharmacy:  Roma Kayser  : cabinet override      08/11/2018 1014 08/16/2018 2229       Assessment/Plan 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 Hulen Skains   LOS: 7 days    Henreitta Cea , [facility] 09/22/2018, 10:23 AM Pager: [phone]

## 2018-09-22 NOTE — Plan of Care (Signed)
  Problem: Education: Goal: Knowledge of General Education information will improve Description Including pain rating scale, medication(s)/side effects and non-pharmacologic comfort measures Outcome: Progressing   Problem: Health Behavior/Discharge Planning: Goal: Ability to manage health-related needs will improve Outcome: Progressing   Problem: Clinical Measurements: Goal: Ability to maintain clinical measurements within normal limits will improve Outcome: Progressing Goal: Will remain free from infection Outcome: Progressing Goal: Diagnostic test results will improve Outcome: Progressing Goal: Respiratory complications will improve Outcome: Progressing Goal: Cardiovascular complication will be avoided Outcome: Progressing   Problem: Activity: Goal: Risk for activity intolerance will decrease Outcome: Progressing   Problem: Nutrition: Goal: Adequate nutrition will be maintained Outcome: Progressing   Problem: Coping: Goal: Level of anxiety will decrease Outcome: Progressing   Problem: Elimination: Goal: Will not experience complications related to bowel motility Outcome: Progressing Goal: Will not experience complications related to urinary retention Outcome: Progressing   Problem: Pain Managment: Goal: General experience of comfort will improve Outcome: Progressing   Problem: Safety: Goal: Ability to remain free from injury will improve Outcome: Progressing   Problem: Skin Integrity: Goal: Risk for impaired skin integrity will decrease Outcome: Progressing   Problem: Education: Goal: Understanding of cardiac disease, CV risk reduction, and recovery process will improve Outcome: Progressing Goal: Individualized Educational Video(s) Outcome: Progressing   Problem: Activity: Goal: Ability to tolerate increased activity will improve Outcome: Progressing   Problem: Cardiac: Goal: Ability to achieve and maintain adequate cardiovascular perfusion will  improve Outcome: Progressing   Problem: Health Behavior/Discharge Planning: Goal: Ability to safely manage health-related needs after discharge will improve Outcome: Progressing   Problem: Education: Goal: Knowledge of disease or condition will improve Outcome: Progressing Goal: Knowledge of secondary prevention will improve Outcome: Progressing Goal: Knowledge of patient specific risk factors addressed and post discharge goals established will improve Outcome: Progressing Goal: Individualized Educational Video(s) Outcome: Progressing   Problem: Coping: Goal: Will verbalize positive feelings about self Outcome: Progressing Goal: Will identify appropriate support needs Outcome: Progressing   Problem: Health Behavior/Discharge Planning: Goal: Ability to manage health-related needs will improve Outcome: Progressing   Problem: Self-Care: Goal: Ability to participate in self-care as condition permits will improve Outcome: Progressing Goal: Verbalization of feelings and concerns over difficulty with self-care will improve Outcome: Progressing Goal: Ability to communicate needs accurately will improve Outcome: Progressing   Problem: Nutrition: Goal: Risk of aspiration will decrease Outcome: Progressing Goal: Dietary intake will improve Outcome: Progressing   Problem: Ischemic Stroke/TIA Tissue Perfusion: Goal: Complications of ischemic stroke/TIA will be minimized Outcome: Progressing   Problem: Spiritual Needs Goal: Ability to function at adequate level Outcome: Progressing

## 2018-09-22 NOTE — Progress Notes (Signed)
OT Progress Note  Contacted CIR to obtain Tilt in Space chair in order to progress OOB to chair tomorrow. Will follow up tomorrow.   Maurie Boettcher, OT/L   Acute OT Clinical Specialist Acute Rehabilitation Services Pager [phone] Office [phone]

## 2018-09-23 DIAGNOSIS — Z930 Tracheostomy status: Secondary | ICD-10-CM

## 2018-09-23 LAB — GLUCOSE, CAPILLARY
GLUCOSE-CAPILLARY: 97 mg/dL (ref 70–99)
GLUCOSE-CAPILLARY: 121 mg/dL — AB (ref 70–99)
Glucose-Capillary: 104 mg/dL — ABNORMAL HIGH (ref 70–99)
Glucose-Capillary: 115 mg/dL — ABNORMAL HIGH (ref 70–99)
Glucose-Capillary: 156 mg/dL — ABNORMAL HIGH (ref 70–99)
Glucose-Capillary: 96 mg/dL (ref 70–99)

## 2018-09-23 LAB — BASIC METABOLIC PANEL
Anion gap: 7 (ref 5–15)
BUN: 25 mg/dL — AB (ref 6–20)
CALCIUM: 10.3 mg/dL (ref 8.9–10.3)
CO2: 23 mmol/L (ref 22–32)
CREATININE: 0.68 mg/dL (ref 0.44–1.00)
Chloride: 110 mmol/L (ref 98–111)
GFR calc non Af Amer: >60 mL/min (ref >60)
Glucose, Bld: 127 mg/dL — ABNORMAL HIGH (ref 70–99)
Potassium: 4.4 mmol/L (ref 3.5–5.1)
SODIUM: 140 mmol/L (ref 135–145)

## 2018-09-23 LAB — TYPE AND SCREEN
ABO/RH(D): B POS
Antibody Screen: NEGATIVE
Unit division: 0
Unit division: 0

## 2018-09-23 LAB — CBC
HCT: 28 % — ABNORMAL LOW (ref 36.0–46.0)
Hemoglobin: 8.5 g/dL — ABNORMAL LOW (ref 12.0–15.0)
MCH: 26.4 pg (ref 26.0–34.0)
MCHC: 30.4 g/dL (ref 30.0–36.0)
MCV: 87 fL (ref 80.0–100.0)
PLATELETS: 392 10*3/uL (ref 150–400)
RBC: 3.22 MIL/uL — ABNORMAL LOW (ref 3.87–5.11)
RDW: 18 % — AB (ref 11.5–15.5)
WBC: 10 10*3/uL (ref 4.0–10.5)
nRBC: 0 % (ref 0.0–0.2)

## 2018-09-23 LAB — BPAM RBC
BLOOD PRODUCT EXPIRATION DATE: 201911232359
BLOOD PRODUCT EXPIRATION DATE: 201912132359
ISSUE DATE / TIME: 201911172240
ISSUE DATE / TIME: 201911180054
Unit Type and Rh: 5100
Unit Type and Rh: 7300

## 2018-09-23 NOTE — Progress Notes (Signed)
  Speech Language Pathology Treatment: Dysphagia  Patient Details Name: Andrea [name] MRN: [id number] DOB: 06/28/1961 Today's Date: 09/23/2018 Time: 0301-3143 SLP Time Calculation (min) (ACUTE ONLY): 19 min  Assessment / Plan / Recommendation Clinical Impression  Given potential d/c in the near future, provided therapeutic trials of a variety of liquid textures to determine potential for possible repeat FEES for diet initiation. Pt with no coughing, throat clearing or wet vocal quality though aspiration still possible. Overall, pt has been progressing well and is expected to have experienced form improvement since initial FEES. Will plan to repeat tomorrow. Discussed with Social Worker to coordinate care.   HPI HPI: 57 yo presented to ED with chest pain noted Rt hemiparesis in ED with acute Left paramedian brainstem infarct. Intubated 10/10 for angioplasty, extubated post procedure and reintubated. Repeat MRI with brainstem infarct extension and bil cerebellar infarcts. Peg/trach 09/03/2018.PMHx: HTN, DM, CKD      SLP Plan  Other (Comment)(repeat FEES)       Recommendations  Diet recommendations: NPO      Patient may use Passy-Muir Speech Valve: During all therapies with supervision;Intermittently with supervision PMSV Supervision: Full MD: Please consider changing trach tube to : Cuffless         SLP Visit Diagnosis: Dysphagia, oropharyngeal phase (R13.12);Aphonia (R49.1) Plan: Other (Comment)(repeat FEES)       GO               Herbie Baltimore, MA CCC-SLP  Acute Rehabilitation Services Pager [phone] Office [phone]  Lynann Beaver 09/23/2018, 11:28 AM

## 2018-09-23 NOTE — Progress Notes (Signed)
NAME:  Andrea [name], MRN:  [id number], DOB:  1961/07/01, LOS: 65 ADMISSION DATE:  08/28/2018, CONSULTATION DATE:  08/18/2018 REFERRING MD:  Dr. Rory Percy, CHIEF COMPLAINT:  CP/ Acute CVA  Brief History   57 yoF w/poorly controlled HTN and IDDM presenting with CP and left arm pain w/BP 220/117. Initial CTH neg.  Cardiac workup negative for acute event thus far, cardiology following.  Code stroke initiated for R hemiplegia, dysarthria and right facial droop.  Out of window for TPA.  Requiring cleviprex for BP control.  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  Evolving pontine stroke on MRI. Intubated 10/10 for neuro IR s/p stenting and angioplasty with 80-90% patency.  Extubated post procedure but due to insufficency placed on BiPAP and brought to ICU.  Required reintubation and ultimately trach (10/21 JY).  Peg tube placed 10/21 by CCS.  Taken to OR 10/28 for open gastrostomy and I&D of abdominal wall abscess. 11/1 taken back to OR for repeat ex-lap.     Past Medical History  HTN, IDDM, hypothyroidism s/p ablation  Significant Hospital Events   10/9 present to [facility] ED -transferred to [facility] 10/9 basilar artery stenting 10/9 intubated urgently 6h post procedure for inability to protect airway. 10/10 CTA for declining exam - evolving pontine infarct, stent deemed to be patent 10/14 Brillinta stopped and ASA/ heparin started to in anticipation of tracheostomy on Monday. 10/14 through 10/19: Neuro exam has been stable sodium increasing in spite of free water via tube, providing supportive care awaiting trach 10/20: Sodium down to 151 from 156 clinically looks the same 10/21: Sodium down to 148, sputum culture sent for purulent tracheal secretions, awaiting trach and PEG.  Purulent bronchial secretions from right upper lobe sent for BAL 10/22: Tracheostomy and PEG completed on the 21st tolerated well.  Spiking low-grade fever white cells continued to climb starting empiric  vancomycin and ceftaz.  Sodium improved down to 146.  Blood pressure improving but still not at goal added hydrochlorothiazide.  Resumed Brilinta.  10/23: Sodium normal.  Backing down on free water.  Glycemic control challenging, increased basal NovoLog in addition to Lantus and sliding scale.  Still spiking fever, white blood cell count climbing, sending blood cultures.  Foley catheter being placed for pressure ulcer caused by pure wick device 10/27 Foul smelling dark trach secretions, PEG secretions  10/28 to OR for ex lap due to PEG dislodgement.  PEG replaced 10/29 remains on MV 2/2 tachypnea; family meeting 10/30 no weaning 2/2 to ongoing tachypnea; foul smelling drainage from abd site 10/31 no weaning, abx changed, family meeting 11/1 repeat Ex-lap for I&D of absces 11/1 tolerated > 48h trach collar trial 11/7 on ATC since 11/12 trach changed 11/13 not tolerating PMV 11/15 developed heavy vaginal bleeding. GYN called. Recommended transvag US.  11/16 started on IV estrogen per recs of GYN, also told to cont megace. Vaginal bleeding improving 11/17 copious Trach secretions. Neuro added HT saline nebs got 2 units PRBC for hgb of 7 11/19 seen by SLP. Has made progress. Repeat FEEs ordered  Consults: date of consult/date signed off & final recs:  Cards 10/10 Neurology 10/10 PCCM 10/10 CCS 10/17  Procedures (surgical and bedside):  10/10 Neuro IR >> s/p stent and angioplasty of distal basilar artery  10/10 ETT for procedure 10/10 ETT (reintubated) >> removed 10/21 10/10 Size 6 tracheostomy placed by Dr. Nelda Marseille 10/26 changed to #4 trach cuffless  10/28 Ex lap with replacement of gastrostomy tube due  to dislodgement 11/1 repeat Ex-lap with I&D of abdominal wall abscess  11/12 trach change  Significant Diagnostic Tests:  10/10  CTH >>  No acute intracranial abnormalities. 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 No definite acute thrombus identified. Severe stenosis left posterior cerebral artery and moderate stenosis right posterior cerebral artery. Mild stenosis left MCA and moderate stenosis left MCA bifurcation. No significant carotid or vertebral artery stenosis in the neck. 10/10 MRI >> evolving pontine infarct. 10/11 ECHO >> normal LVSF with severe LVH. 10/28 CT A / P > dislodged PEG tube outside of the peritoneum and not within the stomach.  Cholelithiasis. No bowel obstruction.  No hydronephrosis.  10/30 CT abd/pelvis >> Mild bilateral atelectasis.  Interval placement of new gastrostomy tube with tip and balloon within gastric lumen. Surgical drain is seen in the soft tissues in previous gastrostomy site. Small amount of gas and stranding is seen in the peritoneal fat anterior and inferior to the stomach which most likely is related to gastrostomy placement. Probable surgical wound seen in right upper quadrant of the anterior abdominal wall.  8.2 x 2.3 cm gas and fluid collection is seen in the anterior abdominal wall which is unchanged compared to prior exam. Possible abscess cannot be excluded.  Enlarged fibroid uterus.  11/14 B LE venous dopplers  >>  Micro Data:  10/10 MRSA PCR >> negative 10/21 Sputum culture >> nl resp flora  10/23 Blood cultures x 2 >> negative 10/26 Blood cultures x 2 >> neg 10/28 MRSA PCR >> neg 10/27 Sputum culture >> normal flora 11/1 Abdominal abscess> kelbsiella, proteus, susceptible to imipenem, amp-sulbactam, ESBL  Antimicrobials:  10/10 cefazolin preop 10/22 Vancomycin >> 10/30 10/22 Ceftaz >> 10/31 10/31 ceftriaxone >> 11/4 10/31 flagyl >> 11/4 11/4 meropenem >> 11/9  Subjective:  No distress.   Objective   Blood pressure (Abnormal) 155/74, pulse 85, temperature 98.8 F (37.1 C), temperature source  Oral, resp. rate 20, height _0  (1.626 m), weight 123 kg, last menstrual period 07/26/2018, SpO2 97 %.    FiO2 (%):  [28 %-35 %] 35 %   Intake/Output Summary (Last 24 hours) at 09/23/2018 1136 Last data filed at 09/23/2018 0900 Gross per 24 hour  Intake 9343.1 ml  Output 650 ml  Net 8693.1 ml   Filed Weights   09/17/18 0500 09/18/18 0500 09/19/18 0500  Weight: 128.4 kg 76 kg 123 kg   Examination:  General: 57 year old African-American female currently resting in bed she is in no acute distress HEENT normocephalic atraumatic size 4 cuffless tracheostomy is unremarkable she is phonating strongly with Passy-Muir valve in place Pulmonary: Diminished bases no accessory use Cardiac: Regular rate and rhythm without murmur gallop Abdomen: Soft nontender no organomegaly tolerating tube feeds Extremities: Warm dry dependent edema foot drop boots in place Neuro: Awake, follows commands, disoriented at times  Resolved problems  Acute respiratory failure abd wall abscess s/p exploratory lap I&D on 10/28 and repeat take back 11/1  Current problem list   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  HTN Neuropathic pain  Uterine bleeding Acute blood loss anemia  Wound care s/p I&D of abd wall abscess  DM hypothyroidism  Current Pulmonary specific problem list    Tracheostomy dependence d/t ineffective airway clearance & poor cough mechanics after CVA - s/p JY trach 10/10 - on ATC since 11/6  -She continues to have weak cough mechanics, I do not think she is a candidate for decannulation, and may not be for some time if at all Plan Continue aerosol trach collar Mobilize PRN bronchodilators Routine tracheostomy change per protocols We will see weekly She will need follow-up in trach clinic at time of  discharge  Dysphasia following CVA Plan Continue tube feeds Fees test scheduled for 11/20   Erick Colace ACNP-BC [place] Pager # [phone] OR # [phone] if no answer

## 2018-09-23 NOTE — Progress Notes (Signed)
Nutrition Follow-up  DOCUMENTATION CODES:   Morbid obesity  INTERVENTION:   -Continue Glucerna 1.2 @ 45 ml/hr via PEG   60 ml Prostat BID.   Tube feeding regimen provides 1675 kcal (100% of needs), 124 grams of protein, and 875 ml of H2O. Total carbohydrate: 163 grams.   NUTRITION DIAGNOSIS:   Inadequate oral intake related to inability to eat as evidenced by NPO status.  Ongoing  GOAL:   Patient will meet greater than or equal to 90% of their needs  Met with TF  MONITOR:   Diet advancement, Labs, Weight trends, Skin, I & O's  REASON FOR ASSESSMENT:   Consult Enteral/tube feeding initiation and management  ASSESSMENT:   Andrea [name] is a 57 yo female with PMH of type 2 diabetes, HTN, CKD III, obesity, and anemia admitted for chest pain. Code Stroke called 10/10 in AM. 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 Intubated 10/10 in ICU.   10/21 trach and PEG 10/28 ex lap for PEG dislodgement, PEG replaced 11/1 ex lap, I&D 11/5- per [facility] notes, pressure injury to labia has healed 11/7- BSE- recommend NPO with continued long term nutrition support 11/8- off vent for 24 hours, on trach collar 11/11- s/p FEES- remain NPO with trials of puddings and purees with SLP; transferred from ICU to PCU 11/12- downsized to #4 cuffless trach  Reviewed I/O's: +8.7 L x 24 hours and +6.9 L since 09/09/18  Pt lying in bed at time of visit. She did not interact with RD when greeted.   Per SLP notes, pt remains NPO, however, scheduled for repeat FEES on 09/24/18.   Glucerna 1.2 infusing via PEG at 45 ml/hr. Pt also receiving 60 ml Prostat BID. Complete regimen providing 1696 kcals, 124 grams protein, and 875 ml free water. Noted pt receiving 163 total grams of carbohydrate with TF regimen. Complete regimen providing 100% of estimated nutritional needs.   CSW following; awaiting acceptance to SNF on LOG.   Labs reviewed: CBGS: 95-156 (inpatient orders for glycemic control are 0-20  units insulin aspart every 4 hours, 4 units insulin aspart every 4 hours, and 18 units insulin glargine BID).   Diet Order:   Diet Order            Diet NPO time specified  Diet effective ____              EDUCATION NEEDS:   Not appropriate for education at this time  Skin:  Skin Assessment: Skin Integrity Issues: Skin Integrity Issues:: Incisions Stage II: healed Incisions: abd  Last BM:  09/20/18  Height:   Ht Readings from Last 1 Encounters:  09/03/18 5' 4"  (1.626 m)    Weight:   Wt Readings from Last 1 Encounters:  09/19/18 123 kg    Ideal Body Weight:  54.5 kg  BMI:  Body mass index is 46.55 kg/m.  Estimated Nutritional Needs:   Kcal:  1600-1800  Protein:  120-135 grams  Fluid:  > 1.6 L    Andrea [name] A. Jimmye Norman, RD, LDN, CDE Pager: [phone] After hours Pager: [phone]

## 2018-09-23 NOTE — Plan of Care (Signed)

## 2018-09-23 NOTE — Progress Notes (Signed)
Occupational Therapy treatment Note  Seen this session with PT (partial session). Discussed plan to get OOB to tilt in space wc. Will return this pm to mobilize pt OOB to chair.     09/23/18 1900  OT Visit Information  Last OT Received On 09/23/18  Assistance Needed +2  PT/OT/SLP Co-Evaluation/Treatment Yes (partial session)  Reason for Co-Treatment Complexity of the patient's impairments (multi-system involvement);For patient/therapist safety;To address functional/ADL transfers  OT goals addressed during session ADL's and self-care  History of Present Illness 57 yo presented to ED with chest pain noted Rt hemiparesis in ED with acute Left paramedian brainstem infarct. Intubated 10/10 for angioplasty, extubated post procedure and reintubated. Repeat MRI with brainstem infarct extension and bil cerebellar infarcts. Peg/trach with return to vent 09/01/2018. 10/22-29 on trach collar, return to vent 10/29. 10/28 I & D of abdominal wall abscess with placement of penrose drain and G-tube, 11/1 repeat ex lap. 11/7 return to trach collar.  PMHx: HTN, DM, CKD  Precautions  Precautions Fall  Precaution Comments Peg/trach, abdominal binder   Pain Assessment  Pain Assessment Faces  Faces Pain Scale 4  Pain Location general discomfort  Pain Descriptors / Indicators Discomfort;Grimacing  Pain Intervention(s) Limited activity within patient's tolerance  Cognition  Arousal/Alertness Awake/alert  Behavior During Therapy  (tearful)  Overall Cognitive Status Impaired/Different from baseline  Area of Impairment Attention;Orientation;Memory;Following commands;Safety/judgement;Awareness;Problem solving  Orientation Level Disoriented to;Time  Current Attention Level Selective  Memory Decreased short-term memory  Following Commands Follows one step commands consistently  Safety/Judgement Decreased awareness of safety;Decreased awareness of deficits  Awareness Emergent  Problem Solving Slow processing   Difficult to assess due to Tracheostomy  Upper Extremity Assessment  Upper Extremity Assessment RUE deficits/detail;LUE deficits/detail  RUE Deficits / Details no active movement; flaccid  LUE Deficits / Details generalized weakness; not using funcitonally; minimal active movemet; appears weaker than last session  Lower Extremity Assessment  Lower Extremity Assessment Defer to PT evaluation  ADL  Grooming Maximal assistance;Wash/dry face;Oral care  General ADL Comments facilitation of trunk control sitting EOBwith neck/head extension  Bed Mobility  Overal bed mobility Needs Assistance  Bed Mobility Supine to Sit;Sit to Supine  Supine to sit Total assist;+2 for physical assistance  Sit to supine Total assist;+2 for physical assistance  General bed mobility comments attempts withhead turns and pusing through LUE with lateral leans to facilitate trunk control  Balance  Overall balance assessment Needs assistance  Sitting balance-Leahy Scale Poor  General Exercises - Upper Extremity  Shoulder Flexion PROM;Both;10 reps;Supine;AAROM  Elbow Flexion PROM;Both;10 reps;AAROM  Shoulder ABduction PROM;Both;10 reps;Supine;AAROM  Shoulder ADduction PROM;Both;10 reps;Supine;AAROM;Left  Shoulder Horizontal ABduction PROM;AROM;Both;15 reps;Seated  Shoulder Horizontal ADduction PROM;AROM;Both;15 reps;Seated  Elbow Extension PROM;Both;10 reps;Supine;AAROM;Left  Wrist Flexion PROM;Both;10 reps;Supine;AAROM;Left  Wrist Extension PROM;Both;10 reps;Supine;AAROM  Digit Composite Flexion PROM;Both;5 reps;Supine;AAROM;Left  OT - End of Session  Equipment Utilized During Treatment Oxygen  Activity Tolerance Patient tolerated treatment well  Patient left in bed;with call bell/phone within reach  Nurse Communication Mobility status  OT Assessment/Plan  OT Plan Discharge plan remains appropriate  OT Visit Diagnosis Other abnormalities of gait and mobility (R26.89);Muscle weakness (generalized) (M62.81);Low  vision, both eyes (H54.2);Feeding difficulties (R63.3);Other symptoms and signs involving cognitive function;Hemiplegia and hemiparesis;Pain  Hemiplegia - Right/Left Right  Hemiplegia - dominant/non-dominant Non-Dominant  Hemiplegia - caused by Nontraumatic intracerebral hemorrhage;Cerebral infarction  Pain - part of body  (general discomfort)  Follow Up Recommendations SNF;Supervision/Assistance - 24 hour  OT Equipment Other (comment) (TBA)  AM-PAC OT "6 Clicks"  Daily Activity Outcome Measure  Help from another person eating meals? 1  Help from another person taking care of personal grooming? 1  Help from another person toileting, which includes using toliet, bedpan, or urinal? 1  Help from another person bathing (including washing, rinsing, drying)? 1  Help from another person to put on and taking off regular upper body clothing? 1  Help from another person to put on and taking off regular lower body clothing? 1  6 Click Score 6  ADL G Code Conversion CN  OT Goal Progression  Progress towards OT goals Progressing toward goals  Acute Rehab OT Goals  Patient Stated Goal per family for pt to get stronger  OT Goal Formulation With patient/family  Time For Goal Achievement 09/26/18  Potential to Achieve Goals Fair  ADL Goals  Pt Will Perform Grooming with max assist;bed level  Pt Will Perform Upper Body Bathing with max assist;bed level  Pt/caregiver will Perform Home Exercise Program Increased ROM;With written HEP provided;Both right and left upper extremity  Additional ADL Goal #1 Pt will sit EOB with +2 Max A x 10 min in preparation for functional activities.  OT Time Calculation  OT Start Time (ACUTE ONLY) 0950  OT Stop Time (ACUTE ONLY) 1009  OT Time Calculation (min) 19 min  OT General Charges  $OT Visit 1 Visit  OT Treatments  $Therapeutic Activity 8-22 mins  Maurie Boettcher, OT/L   Acute OT Clinical Specialist [place] Pager [phone] Office  [phone]

## 2018-09-23 NOTE — Progress Notes (Signed)
OT Treatment Note  Pt mobilized OOB to Tilt in Space wc with use of Maximove. Pt pushed around unit and visited nurses throughout the unit and thanked nurses on ICU side for taking care of her. Pt then pushed around unit to look outside. Pt became very upset, asking why her family wouldn't take care of her or take her home. Pt stated "I've been working so hard and they won't take care of me". Pt stated "I don't want to live like this"; "I feel like I'm dying" "All I want is some soda and chocolate cake". Pt asking why she is not ablet o walk and why she is not getting stronger.  Discussed level of care required to care for her at this time and need for rehab at [facility]. Pt agreed. Pt appears more tearful and upset as compared to previous sessions. Recommend another consult from Palliative medicine to discuss goals of care.  Educated nsg staff on use of tilt in space. Recommend pt up daily in wc with use of maximove.  Per discussion with ST, pt has visit arranged with therapy dog tomorrow.     09/23/18 1919  OT Visit Information  Last OT Received On 09/23/18  Assistance Needed +2  History of Present Illness 57 yo presented to ED with chest pain noted Rt hemiparesis in ED with acute Left paramedian brainstem infarct. Intubated 10/10 for angioplasty, extubated post procedure and reintubated. Repeat MRI with brainstem infarct extension and bil cerebellar infarcts. Peg/trach with return to vent 08/26/2018. 10/22-29 on trach collar, return to vent 10/29. 10/28 I & D of abdominal wall abscess with placement of penrose drain and G-tube, 11/1 repeat ex lap. 11/7 return to trach collar.  PMHx: HTN, DM, CKD  Precautions  Precautions Fall  Precaution Comments Peg/trach, abdominal binder   Pain Assessment  Pain Assessment Faces  Faces Pain Scale 4  Pain Location general discomfort  Pain Descriptors / Indicators Discomfort;Grimacing  Pain Intervention(s) Limited activity within patient's tolerance  Cognition   Arousal/Alertness Awake/alert  Behavior During Therapy  (tearful)  Overall Cognitive Status Impaired/Different from baseline  Area of Impairment Attention;Safety/judgement;Awareness;Problem solving  General Comments Pt tearful at times throughout session stating she was ready to go home and asking when someone was going to pick her up. Educated pt on need for rehab and her families inability to care for her at this level.   General Comments  General comments (skin integrity, edema, etc.) Pt mobilized OOB to tilt in space wc with use of Maximove; VSS throughotu session.   OT - End of Session  Equipment Utilized During Treatment Oxygen;Other (comment) (lift equipment)  Activity Tolerance Patient tolerated treatment well  Patient left Other (comment) (in tilt in space wc)  Nurse Communication Mobility status;Other (comment) (use of wc)  OT Assessment/Plan  OT Plan Discharge plan remains appropriate  OT Visit Diagnosis Other abnormalities of gait and mobility (R26.89);Muscle weakness (generalized) (M62.81);Low vision, both eyes (H54.2);Feeding difficulties (R63.3);Other symptoms and signs involving cognitive function;Hemiplegia and hemiparesis;Pain  Hemiplegia - Right/Left Right  Hemiplegia - dominant/non-dominant Non-Dominant  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 1  Help from another person taking care of personal grooming? 1  Help from another person toileting, which includes  using toliet, bedpan, or urinal? 1  Help from another person bathing (including washing, rinsing, drying)? 1  Help from another person to put on and taking off regular upper body clothing? 1  Help  from another person to put on and taking off regular lower body clothing? 1  6 Click Score 6  ADL G Code Conversion CN  OT Goal Progression  Progress towards OT goals Progressing toward goals  OT Time Calculation  OT Start Time (ACUTE ONLY) 1530  OT Stop Time (ACUTE ONLY) 1635  OT Time Calculation (min) 65 min  OT General Charges  $OT Visit 1 Visit  OT Treatments  $Therapeutic Activity 53-67 mins  Maurie Boettcher, OT/L   Acute OT Clinical Specialist [place] Pager [phone] Office [phone]

## 2018-09-23 NOTE — Clinical Social Work Note (Signed)
Clinical Social Work Assessment  Patient Details  Name: Andrea [name] MRN: [id number] Date of Birth: Dec 03, 1960  Date of referral:  09/23/18               Reason for consult:  Facility Placement, Discharge Planning                Permission sought to share information with:  Facility Sport and exercise psychologist, Family Supports Permission granted to share information::  Yes, Verbal Permission Granted  Name::     [name]  Agency::  SNFs  Relationship::  sister  Contact Information:  [phone]  Housing/Transportation Living arrangements for the past 2 months:  Single Family Home Source of Information:  Patient Patient Interpreter Needed:  None Criminal Activity/Legal Involvement Pertinent to Current Situation/Hospitalization:  No - Comment as needed Significant Relationships:  Adult Children, Siblings Lives with:  Self Do you feel safe going back to the place where you live?  Yes Need for family participation in patient care:  Yes (Comment)  Care giving concerns:  Pt has trach, abdominal wound, and feeding tube. Pt requires max assist with ADL/IADLs. LTAC recommended however pt is uninsured and cannot go to [employer] without insurance coverage. SNF is being arranged under LOG.   Social Worker assessment / plan:  CSW met with pt at bedside. Introduced self, role, and reason for visit. CSW explained that social work dept has been following pt case to support disposition. Discussed that pt requires max assistance and we are looking for safe discharge plans under a contract from the hospital since pt does not have insurance coverage (Medicaid is pending).   Pt nodded in understanding but then continued to tell CSW "I want to go home" multiple times. When CSW explained that pt needed lots of equipment and almost total assistance and it has been discussed that her family cannot provide that pt cried. Pt did give permission for CSW to call [facility], [phone].   HIPPA compliant message left  for [facility]. Await return call. Still searching for LOG bed.   Employment status:  Disabled (Comment on whether or not currently receiving Disability) Insurance information:  Self Pay (Medicaid Pending) PT Recommendations:  [place], 24 Hour Supervision Information / Referral to community resources:  [place]  Patient/Family's Response to care:  Pt amenable to meeting with CSW. Adamantly expressing that she would like to go home.   Patient/Family's Understanding of and Emotional Response to Diagnosis, Current Treatment, and Prognosis:  Pt states understanding of diagnosis, current treatment and prognosis. However, unsure if pt realizes all of the assistance that she needs and what would be required to take her home. When CSW tried to explain the various things that would have to be in place pt kept repeating "I want to go home, I want to go home, I want to go home," while crying. Will discuss this with her family so that they can discuss with pt about why pt may not be able to go home.  Emotional Assessment Appearance:  Appears stated age Attitude/Demeanor/Rapport:  Crying, Guarded Affect (typically observed):  Apprehensive, Anxious, Guarded, Tearful/Crying Orientation:  Oriented to Self, Oriented to Place Alcohol / Substance use:  Not Applicable Psych involvement (Current and /or in the community):  No (Comment)  Discharge Needs  Concerns to be addressed:  Basic Needs, Coping/Stress Concerns, Discharge Planning Concerns, Care Coordination Readmission within the last 30 days:  No Current discharge risk:  Physical Impairment, Dependent with Mobility, Inadequate Financial Supports Barriers to  Discharge:  Continued Medical Work up, Inadequate or no insurance   [place], [place] 09/23/2018, 12:07 PM

## 2018-09-23 NOTE — Social Work (Signed)
CSW received return call and spoke with pt sister [place]. CSW let [name] know that pt was tearful and asking to return home. Confirmed with [name] that there is not enough assistance at home for pt. CSW requested that [name] have additional conversation with Lasonja and let her know why she is unable to return home at the current time.  Discussed SNF placement, LOG process, and that [facility] was currently reviewing pt clinicals. [name] is displeased with this as the only option. CSW explained that unfortunately we are limited in our ability to place pt under LOG at a facility of their choice due to lack of payor. We discussed that if another facility was not able to offer that we would either have to send the pt to another county for SNF or that pt family would have to discuss taking pt home. [name] states understanding, will follow up with other facilities regarding potential for alternate placement offers.   Westley Hummer, MSW, [place] Work [phone]

## 2018-09-23 NOTE — Progress Notes (Signed)
STROKE TEAM PROGRESS NOTE   SUBJECTIVE (INTERVAL HISTORY) No family is at bedside. Pt is on speaking valve and able to speak but with moderate dysarthria. She wants to go home and her family will take care of her. I told her that she needs 24/7 care and her home or family likely not able to provide that level of care. She became very upset and in tears.   OBJECTIVE Vitals:   09/23/18 0800 09/23/18 0849 09/23/18 0900 09/23/18 1159  BP: 137/76  (!) 155/74   Pulse: 74  85 85  Resp:   20 (!) 27  Temp: 98.8 F (37.1 C)  98.8 F (37.1 C)   TempSrc: Oral  Oral   SpO2: 98% 97% 97% 99%  Weight:      Height:        CBC:  Recent Labs  Lab 09/20/18 0323  09/22/18 0638 09/23/18 0435  WBC 9.5   < > 9.9 10.0  NEUTROABS 6.7  --   --   --   HGB 7.7*   < > 8.6* 8.5*  HCT 26.5*   < > 28.0* 28.0*  MCV 85.5   < > 85.6 87.0  PLT 503*   < > 395 392   < > = values in this interval not displayed.    Basic Metabolic Panel:  Recent Labs  Lab 09/20/18 0323  09/22/18 0638 09/23/18 0435  NA 133*   < > 137 140  K 4.6   < > 4.5 4.4  CL 105   < > 108 110  CO2 22   < > 25 23  GLUCOSE 143*   < > 116* 127*  BUN 35*   < > 30* 25*  CREATININE 0.85   < > 0.73 0.68  CALCIUM 10.1   < > 9.9 10.3  PHOS 3.5  --   --   --    < > = values in this interval not displayed.    Lipid Panel:     Component Value Date/Time   CHOL 256 (H) 08/15/2018 0512   TRIG 248 (H) 08/15/2018 0512   HDL 35 (L) 08/15/2018 0512   CHOLHDL 7.3 08/15/2018 0512   VLDL 50 (H) 08/15/2018 0512   LDLCALC 171 (H) 08/15/2018 0512   HgbA1c:  Lab Results  Component Value Date   HGBA1C 11.4 (H) 08/15/2018   Urine Drug Screen:     Component Value Date/Time   LABOPIA NONE DETECTED 08/25/2018 1836   COCAINSCRNUR NONE DETECTED 08/15/2018 1836   LABBENZ NONE DETECTED 08/22/2018 1836   AMPHETMU NONE DETECTED 08/06/2018 1836   THCU NONE DETECTED 09/01/2018 1836   LABBARB NONE DETECTED 09/02/2018 1836    Alcohol Level No results  found for: ETH  IMAGING  Ct Angio Head W Or Wo Contrast Ct Angio Neck W Or Wo Contrast 08/15/2018 IMPRESSION:  1. Interval basilar to left proximal PCA stenting. The density of the stent walls precludes detection of in stent stenosis; there is a degree of wasting at the mid basilar segment. There is flow in the left PCA beyond the stent such that there is presumed stent patency.  2. Known lower pontine infarct. There are new small cerebellar infarcts since brain MRI yesterday.  3. Severe left P2 segment stenosis, also seen previously.  4. Moderate atheromatous narrowing in the proximal left MCA.    Ct Angio Head W Or Wo Contrast Ct Angio Neck W Or Wo Contrast 08/13/2018 IMPRESSION:  1. Extensive atherosclerotic disease in the  basilar with focal critical stenosis in the mid to distal basilar. No definite acute thrombus identified.  2. Severe stenosis left posterior cerebral artery and moderate stenosis right posterior cerebral artery.  3. Mild stenosis left MCA and moderate stenosis left MCA bifurcation.  4. No significant carotid or vertebral artery stenosis in the neck.    Ct Head Wo Contrast 09/03/2018 IMPRESSION:  No acute intracranial abnormalities. Mild white matter changes likely due to small vessel ischemia.    Mr Brain Wo Contrast 08/17/2018 IMPRESSION:  1. Acute nonhemorrhagic infarct involving the left paramedian brainstem. The infarct crosses midline.  2. Other periventricular and subcortical white matter disease is moderately advanced for age. This likely reflects the sequela of chronic microvascular ischemia.  3. Tapering of the dens with prominent soft tissue pannus. 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    Ct Head Code Stroke Wo Contrast 08/18/2018  IMPRESSION:  1. No acute abnormality and no change from earlier today  2. ASPECTS is 10 3.    Cerebral Angiogram 08/28/2018 S/P 4 vessel cerebral arteriogram RT CFA approach. Findings  1.Severe  stenosis of distal basilar artery 90 % ,associated with mod to severe ASVD of the mid basilar artery and Lt ANt cerebellar A. S/P stent assisted angioplasty of distal basilar artery with patency of 80 to 90 %. 2.Approx 70 % stenosis of LT ICA supraclinoid seg   MRI 08/15/18 1. Progressive acute pontine infarct. New patchy bilateral cerebellar infarction. New tiny left thalamic infarcts. 2. Preserved flow void in the stented basilar. 3. left facial/submandibular edema, please correlate with neck exam. Edited result: IMPRESSION: 1. Progressive acute pontine infarct. New patchy bilateral cerebellar infarction. New tiny left thalamic infarcts. 2. Preserved flow void in the stented basilar.  TTE - Left ventricle: The cavity size was normal. There was severe   concentric hypertrophy. Systolic function was normal. The   estimated ejection fraction was in the range of 60% to 65%. Wall   motion was normal; there were no regional wall motion   abnormalities. Doppler parameters are consistent with abnormal   left ventricular relaxation (grade 1 diastolic dysfunction). The   E/e&' ratio is <8, suggesting normal LV filling pressure. - Aortic valve: Trileaflet; mildly calcified leaflets.   Transvalvular velocity was minimally increased. There was no   regurgitation. Mean gradient (S): 12 mm Hg. - Mitral valve: Mildly thickened leaflets . There was trivial   regurgitation. - Left atrium: The atrium was normal in size. - Inferior vena cava: The vessel was dilated. The respirophasic   diameter changes were blunted (< 50%), consistent with elevated   central venous pressure. Impressions: - LVEF 60-65%, severe LVH, normal wall motion, grade 1 DD, normal   LV filling pressure, minimally increased aortic velocity without   signficant stenosis, trivial MR, normal LA size, dilated IVC.  CT abdomen and pelvis 09/03/18 Mild bilateral posterior basilar subsegmental atelectasis. Interval placement of  new gastrostomy tube with tip and balloon within gastric lumen. Surgical drain is seen in the soft tissues in previous gastrostomy site. Small amount of gas and stranding is seen in the peritoneal fat anterior and inferior to the stomach which most likely is related to gastrostomy placement. Probable surgical wound seen in right upper quadrant of the anterior abdominal wall. 8.2 x 2.3 cm gas and fluid collection is seen in the anterior abdominal wall which is unchanged compared to prior exam. Possible abscess cannot be excluded. Enlarged fibroid uterus.  PHYSICAL EXAM  Temp:  [98.6 F (37 C)-99.4 F (  37.4 C)] 98.8 F (37.1 C) (11/19 0900) Pulse Rate:  [74-89] 85 (11/19 1159) Resp:  [18-30] 27 (11/19 1159) BP: (137-186)/(69-92) 155/74 (11/19 0900) SpO2:  [54 %-100 %] 99 % (11/19 1159) FiO2 (%):  [28 %-35 %] 35 % (11/19 1159)  General -  Obese middle-aged African-American lady, on trach collar, not in distress. She has abdominal surgical wound and drainage from the abdominal wall, dressing clean  Ophthalmologic - fundi not visualized due to noncooperation.  Cardiovascular - regular rate and rhythm  Neuro - on trach collar, eyes open, awake, alert. No ptosis or EOMI, denies diplopia.  PERRL. Bilateral facial weakness, tongue protrusion weak. Able to wiggle left hand fingers and left foot toes. 2-/5 LLE and 2/5 LUE. Follows commands and good eye contact.  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   Dense right hemiplegia persists.  ASSESSMENT/PLAN Ms. RYNN MARKIEWICZ is a 57 y.o. female with history of difficult to control hypertension, insulin-dependent diabetes, obesity, hypothyroidism status post ablation presenting with chest discomfort, right-sided weakness, slurred speech and right facial droop. She did not receive IV t-PA due to late presentation. S/P stent assisted angioplasty of distal basilar artery.  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 Worsening symptoms with extension of pontine/medullary infarcts with new small bilateral cerebellar infarcts without evidence of stent re-stenosis or occlusion  Resultant quadriplegia, on trach and peg  CT head - No acute intracranial abnormalities.  CTA H&N 09/04/2018 - Extensive atherosclerotic disease in the basilar with focal critical stenosis in the mid to distal basilar. No definite acute thrombus identified. Severe stenosis left posterior cerebral artery  MRI head - Acute nonhemorrhagic infarct involving the left paramedian brainstem.   CTA H&N 08/15/2018 - stent too dense to see BA lumen, but distal flow preserved. New small cerebellar infarcts since brain MRI yesterday.  Severe left P2 segment stenosis, also seen previously.   Repeat MRI - extension of b/l pontine and upper medullary infarcts with new b/l cerebellar infarcts.  2D Echo - EF 60-65%  LDL - 174  HgbA1c - 11.1  VTE prophylaxis - heparin subq  aspirin 81 mg daily prior to admission, now resumed on Brilinta 90 mg bid and ASA 81 mg daily.   Patient counseled to be compliant with her antithrombotic medications  Ongoing aggressive stroke risk factor management  Therapy recommendations:  LTACH  Disposition:  SNF  Abnormal uterine bleeding, improved  Has been following with GYN  Was on megace in the past  CT abd/pelvis 09/03/18 - enlarged fibroid uterus  continue megace 80mg  bid  Continue ASA and brilinta for now  OBGYN recs appreciated  Gave prmarin 25mg  IV as recommended - vaginal bleeding much improved  Close CBC monitoring  Anemia   Hb 6.8->PRBC->7.1->9.0-7.7->7.0->PRBC->8.6->8.5  Likely due to iron deficiency, heavy periods and acute uterine bleeding and recent surgeries  Iron panel showed iron deficiency  On iron solutions  PRBC transfusion 1 U 09/20/2018 and 2U 09/07/18 and 2U 09/21/18  CBC monitoring  Intracranial stenosis  BA mid to  distal severe stenosis s/p BA stenting  Left PCA severe stenosis, R PCA moderate stenosis  Left MCA moderate stenosis  Uncontrolled stroke risk factors with HLD, HTN, DM  Non compliance with meds at home  Respiratory failure  S/p trach 08/26/2018  On trach collar now  Able to speak with speaking valve although dysarthric  Working with speech therapist for speaking valve  Hypertension  Stable   BP goal 130-150   On Coreg 25 bid, amlodipine and hydralazine  Close monitoring  Hyperlipidemia  Lipid lowering medication PTA:  Lipitor 20 mg daily  LDL 174, goal < 70  Increased to 80 mg daily  Continue statin at discharge  Diabetes  HgbA1c 11.4, goal < 7.0  Uncontrolled at home  Glucose stable on insulin   Decrease Lantus again today to 18U bid   Decrease basal NovoLog again today to 4U Q4h  SSI  CBG monitoring  Low grade fever with leukocytosis, resolved  Tmax 101.6 -> afebrile  Leukocytosis - 14.0->12.1->9.9->11.2->11.5->11.3->9.5->9.9  UA WBC 21-50  CXR unremarkable, improving from prior  Blood cultures negative 10/26   Respiratory cultures - mixed flora   Abdominal abscess culture showed proteus mirabilis  Off antibiotics  Hypernatremia -> hyponatremia  Na 149->148->150->147->144->143->134->133->137->140  On TF @ 45  on NS @ 40  Continue BMP monitoring  Dysphagia s/p PEG complicated by abdominal wall abscess  Continue tube feeding @ 45 cc/h  PEG done 08/06/2018  Removed and replaced dislodged PEG 08/05/2018  CT showed Abdominal wall abscess - s/p open surgical drainage 09/02/2018 persistent foul odor discharge. Reexploration 09/11/2018  Culture showed peroteus mirabilis  Off meropenum  Trauma surgery following, appreciate recs  Wound debrided appropriately and sutures removed.  No evisceration  Other Stroke Risk Factors  Obesity, Body mass index is 46.55 kg/m., recommend weight loss, diet and exercise as appropriate   Other  Active Problems  Hypokalemia - resolved  Thrombocytosis - likely due to anemia, resolving  Hospital day # 40  Rosalin Hawking, MD PhD Stroke Neurology 09/23/2018 1:33 PM   To contact Stroke Continuity provider, please refer to [email or web address] After hours, contact General Neurology

## 2018-09-23 NOTE — Progress Notes (Signed)
Physical Therapy Treatment Patient Details Name: Andrea [name] MRN: [id number] DOB: [birth year] Today's Date: 09/23/2018    History of Present Illness 57 yo presented to ED with chest pain noted Rt hemiparesis in ED with acute Left paramedian brainstem infarct. Intubated 10/10 for angioplasty, extubated post procedure and reintubated. Repeat MRI with brainstem infarct extension and bil cerebellar infarcts. Peg/trach with return to vent 08/30/2018. 10/22-29 on trach collar, return to vent 10/29. 10/28 I & D of abdominal wall abscess with placement of penrose drain and G-tube, 11/1 repeat ex lap. 11/7 return to trach collar.  PMHx: HTN, DM, CKD    PT Comments    Pt cont to require total assist with all mobility however did demo increased sitting EOB tolerance to 15 min with noted improved trunk stability. Acute PT to cont to follow.   Follow Up Recommendations  SNF;Supervision/Assistance - 24 hour     Equipment Recommendations  Hospital bed;Wheelchair (measurements PT);Other (comment)    Recommendations for Other Services       Precautions / Restrictions Precautions Precautions: Fall Precaution Comments: Peg/trach, abdominal binder  Restrictions Weight Bearing Restrictions: No    Mobility  Bed Mobility Overal bed mobility: Needs Assistance Bed Mobility: Supine to Sit     Supine to sit: Total assist;+2 for physical assistance Sit to supine: Total assist;+2 for physical assistance   General bed mobility comments: sat EOB x 15 min  with maxA to maintain upright support  Transfers                 General transfer comment: not completed today  Ambulation/Gait             General Gait Details: unable   Stairs             Wheelchair Mobility    Modified Rankin (Stroke Patients Only) Modified Rankin (Stroke Patients Only) Pre-Morbid Rankin Score: No symptoms Modified Rankin: Severe disability     Balance Overall balance assessment: Needs  assistance Sitting-balance support: Feet supported;Bilateral upper extremity supported Sitting balance-Leahy Scale: Zero Sitting balance - Comments: increased ability to incorporate trunk in movemetns while seated                                    Cognition Arousal/Alertness: Awake/alert Behavior During Therapy: Anxious(regarding sitting EOB) Overall Cognitive Status: Impaired/Different from baseline Area of Impairment: Attention;Safety/judgement;Problem solving                   Current Attention Level: Selective   Following Commands: Follows one step commands with increased time Safety/Judgement: Decreased awareness of safety;Decreased awareness of deficits Awareness: Emergent Problem Solving: Slow processing;Decreased initiation;Difficulty sequencing;Requires verbal cues;Requires tactile cues        Exercises Other Exercises Other Exercises: focused on trunk control sitting EOB today. Provided tactile stimuli to stimulate shoulder retraction and trunk extension during ADLs tasks with OT.  Other Exercises: pt did attempted L LAQ but unable on R LE    General Comments General comments (skin integrity, edema, etc.): VSS, pt dependent for hygiene s/p urinary incontinence       Pertinent Vitals/Pain Pain Assessment: Faces Faces Pain Scale: Hurts even more Pain Location: bilat UEs with movement Pain Intervention(s): Monitored during session    Home Living                      Prior Function  PT Goals (current goals can now be found in the care plan section) Progress towards PT goals: Progressing toward goals    Frequency    Min 2X/week      PT Plan Current plan remains appropriate    Co-evaluation PT/OT/SLP Co-Evaluation/Treatment: Yes Reason for Co-Treatment: For patient/therapist safety PT goals addressed during session: Mobility/safety with mobility        AM-PAC PT "6 Clicks" Daily Activity  Outcome Measure   Difficulty turning over in bed (including adjusting bedclothes, sheets and blankets)?: Unable Difficulty moving from lying on back to sitting on the side of the bed? : Unable Difficulty sitting down on and standing up from a chair with arms (e.g., wheelchair, bedside commode, etc,.)?: Unable Help needed moving to and from a bed to chair (including a wheelchair)?: Total Help needed walking in hospital room?: Total Help needed climbing 3-5 steps with a railing? : Total 6 Click Score: 6    End of Session Equipment Utilized During Treatment: Oxygen;Other (comment) Activity Tolerance: Patient tolerated treatment well Patient left: in bed;with call bell/phone within reach;with nursing/sitter in room Nurse Communication: Mobility status PT Visit Diagnosis: Other abnormalities of gait and mobility (R26.89);Muscle weakness (generalized) (M62.81);Other symptoms and signs involving the nervous system (R29.898);Hemiplegia and hemiparesis Hemiplegia - Right/Left: Right Hemiplegia - dominant/non-dominant: Dominant Hemiplegia - caused by: Cerebral infarction     Time: 0927-1008 PT Time Calculation (min) (ACUTE ONLY): 41 min  Charges:  $Therapeutic Activity: 8-22 mins $Neuromuscular Re-education: 8-22 mins                     Kittie Plater, PT, DPT Acute Rehabilitation Services Pager #: [phone] Office #: [phone]    Berline Lopes 09/23/2018, 12:28 PM

## 2018-09-24 LAB — GLUCOSE, CAPILLARY
GLUCOSE-CAPILLARY: 95 mg/dL (ref 70–99)
GLUCOSE-CAPILLARY: 98 mg/dL (ref 70–99)
Glucose-Capillary: 132 mg/dL — ABNORMAL HIGH (ref 70–99)
Glucose-Capillary: 91 mg/dL (ref 70–99)
Glucose-Capillary: 147 mg/dL — ABNORMAL HIGH (ref 70–99)
Glucose-Capillary: 86 mg/dL (ref 70–99)

## 2018-09-24 LAB — BASIC METABOLIC PANEL
Anion gap: 8 (ref 5–15)
BUN: 27 mg/dL — ABNORMAL HIGH (ref 6–20)
CHLORIDE: 110 mmol/L (ref 98–111)
CO2: 20 mmol/L — ABNORMAL LOW (ref 22–32)
CREATININE: 0.66 mg/dL (ref 0.44–1.00)
Calcium: 10.4 mg/dL — ABNORMAL HIGH (ref 8.9–10.3)
GFR calc Af Amer: >60 mL/min (ref >60)
GLUCOSE: 122 mg/dL — AB (ref 70–99)
Potassium: 4.2 mmol/L (ref 3.5–5.1)
Sodium: 138 mmol/L (ref 135–145)

## 2018-09-24 LAB — CBC
HEMATOCRIT: 28.1 % — AB (ref 36.0–46.0)
Hemoglobin: 8.3 g/dL — ABNORMAL LOW (ref 12.0–15.0)
MCH: 25.9 pg — AB (ref 26.0–34.0)
MCHC: 29.5 g/dL — AB (ref 30.0–36.0)
MCV: 87.8 fL (ref 80.0–100.0)
Platelets: 371 10*3/uL (ref 150–400)
RBC: 3.2 MIL/uL — ABNORMAL LOW (ref 3.87–5.11)
RDW: 18.2 % — AB (ref 11.5–15.5)
WBC: 9.1 10*3/uL (ref 4.0–10.5)
nRBC: 0 % (ref 0.0–0.2)

## 2018-09-24 MED ORDER — ORAL CARE MOUTH RINSE
15.0000 mL | Freq: Two times a day (BID) | OROMUCOSAL | Status: DC
  Administered 2018-09-24 – 2018-11-13 (×75): 15 mL via OROMUCOSAL

## 2018-09-24 MED ORDER — CHLORHEXIDINE GLUCONATE 0.12 % MT SOLN
15.0000 mL | Freq: Two times a day (BID) | OROMUCOSAL | Status: DC
  Administered 2018-09-24 – 2018-11-13 (×94): 15 mL via OROMUCOSAL
  Filled 2018-09-24 (×92): qty 15

## 2018-09-24 NOTE — Progress Notes (Signed)
Have scheduled therapy dog to visit Bertie at approximately 1130 today. She will also have a repeat FEES around 930 am to reassess swallowing.   Herbie Baltimore, [place]  Acute Rehabilitation Services Pager [phone] Office [phone]

## 2018-09-24 NOTE — Progress Notes (Signed)
  Speech Language Pathology Treatment: Andrea [name] Speaking valve  Patient Details Name: Andrea [name] MRN: [id number] DOB: 09/04/1961 Today's Date: 09/24/2018 Time: 4665-9935 SLP Time Calculation (min) (ACUTE ONLY): 60 min  Assessment / Plan / Recommendation Clinical Impression  Pt cotreated with OT. PMSV in place for one hour during bath, transfer via lift to tilt chair, social interaction with therapy dog, stroll around windows outside of unit and conversation back in room. There were several instances where Andrea [name] did not have adequate breath support for intelligible conversation and required min verbal cues. Suspect this was both a combination of decreased volitional control of respiratory musculature as well as insecurity being in "public" in her wheel chair. OT and SLP provided question cues for pt to interact with dogs owner. She could not touch the dog due to contract precautions. Pt expressing anxiety related to lack of control over situation and care. Encouraged opportunities for pt to express wants and needs and verbalize concerns. Future session to continue to target respiratory muscle strength training for increased breath support and improved cough initiation for airway protection.   HPI HPI: 57 yo presented to ED with chest pain noted Rt hemiparesis in ED with acute Left paramedian brainstem infarct. Intubated 10/10 for angioplasty, extubated post procedure and reintubated. Repeat MRI with brainstem infarct extension and bil cerebellar infarcts. Peg/trach 08/29/2018.PMHx: HTN, DM, CKD      SLP Plan  Continue with current plan of care       Recommendations  Diet recommendations: NPO                Follow up Recommendations: Skilled Nursing facility SLP Visit Diagnosis: Dysarthria and anarthria (R47.1) Plan: Continue with current plan of care       GO                Andrea [name], Andrea [name] 09/24/2018, 2:38 PM

## 2018-09-24 NOTE — Procedures (Signed)
Objective Swallowing Evaluation: Type of Study: FEES-Fiberoptic Endoscopic Evaluation of Swallow   Patient Details  Name: Andrea [name] MRN: [id number] Date of Birth: Jul 22, 1961  Today's Date: 09/24/2018 Time: SLP Start Time (ACUTE ONLY): 0915 -SLP Stop Time (ACUTE ONLY): 1015  SLP Time Calculation (min) (ACUTE ONLY): 60 min   Past Medical History:  Past Medical History:  Diagnosis Date  . Diabetes mellitus   . Environmental allergies   . Fibroid   . High cholesterol   . Hx of cardiovascular stress test    a. ETT-MV 2/14:  EF 46% (normal by echo), low risk, no ischemia or scar  . Hx of echocardiogram    Echo 2/14: mild LVH, EF 55-60%, Gr 1 diast dysfn, mild LAE  . Hypertension   . Hyperthyroidism    Radioactive iodine ablation   Past Surgical History:  Past Surgical History:  Procedure Laterality Date  . [place], 2001   x 2  . ESOPHAGOGASTRODUODENOSCOPY N/A 08/18/2018   Procedure: ESOPHAGOGASTRODUODENOSCOPY (EGD);  Surgeon: Judeth Horn, MD;  Location: [place];  Service: General;  Laterality: N/A;  bedside  . GASTROSTOMY N/A 08/07/2018   Procedure: INSERTION OF GASTROSTOMY TUBE;  Surgeon: Judeth Horn, MD;  Location: [place];  Service: General;  Laterality: N/A;  . IR ANGIO INTRA EXTRACRAN SEL COM CAROTID INNOMINATE BILAT MOD SED  08/26/2018  . IR ANGIO VERTEBRAL SEL SUBCLAVIAN INNOMINATE UNI R MOD SED  08/19/2018  . IR CT HEAD LTD  08/11/2018  . IR INTRA CRAN STENT  08/19/2018  . LAPAROTOMY N/A 08/20/2018   Procedure: EXPLORATORY LAPAROTOMY;  Surgeon: Judeth Horn, MD;  Location: [place];  Service: General;  Laterality: N/A;  . LAPAROTOMY N/A 10/04/2018   Procedure: EXPLORATORY LAPAROTOMY;  Surgeon: Judeth Horn, MD;  Location: [place];  Service: General;  Laterality: N/A;  . PEG PLACEMENT N/A 08/30/2018   Procedure: PERCUTANEOUS ENDOSCOPIC GASTROSTOMY (PEG) PLACEMENT;  Surgeon: Judeth Horn, MD;  Location: [place];  Service: General;  Laterality:  N/A;  . RADIOLOGY WITH ANESTHESIA N/A 08/08/2018   Procedure: IR WITH ANESTHESIA;  Surgeon: Luanne Bras, MD;  Location: [facility];  Service: Radiology;  Laterality: N/A;   HPI: 57 yo presented to ED with chest pain noted Rt hemiparesis in ED with acute Left paramedian brainstem infarct. Intubated 10/10 for angioplasty, extubated post procedure and reintubated. Repeat MRI with brainstem infarct extension and bil cerebellar infarcts. Peg/trach 08/28/2018.PMHx: HTN, DM, CKD   Subjective: alert, participatory and communicative    Assessment / Plan / Recommendation  CHL IP CLINICAL IMPRESSIONS 09/24/2018  Clinical Impression Pt presents with marginal improvement in swallow function since prior FEES on 11/11.  Larynx without edema/erythema, mild sluggishness left arytenoid medialization, but vocal folds reach midline bilaterally.  PMV in place for FEES.  Pt demonstrated ongoing impairments in oral manipulation of POs, with sensorimotor deficits leading to puree/honey-thick residue on tongue, prolonged mastication; she tended to partition boluses into smaller sub-boluses before releasing into pharynx.  There was consistent lateralization of most of bolus to left pyriform, at which time swallow was triggered. Penetration of nectar and honey-thick liquids reached the level of the vocal folds; aspiration of nectars and ice chips occurred before the swallow.  Aspiration did not elicit a spontaneous cough. Cued coughs showed some improvement in expelling penetrate/aspirate, but were inconsistent as cough remains weak.  Purees were consumed with inconsistent penetration.  With tactile cues, pt able to achieve partial chin tuck, but effects were inconclusive.  Dysphagia remains primarily related  to sensorimotor/timing deficts. For now, recommend continued trials of purees, honey thick liquids from spoon with SLP only; continue nutrition via PEG.  Allow ice chips after oral care, when sitting upright with PMV in  place, with nursing supervision.  Results discussed with pt, RN.  Continue SLP for dysphagia management.  SLP Visit Diagnosis Dysphagia, oropharyngeal phase (R13.12)  Attention and concentration deficit following --  Frontal lobe and executive function deficit following --  Impact on safety and function Moderate aspiration risk      CHL IP TREATMENT RECOMMENDATION 09/24/2018  Treatment Recommendations Therapy as outlined in treatment plan below     Prognosis 09/15/2018  Prognosis for Safe Diet Advancement Good  Barriers to Reach Goals --  Barriers/Prognosis Comment --    CHL IP DIET RECOMMENDATION 09/24/2018  SLP Diet Recommendations NPO  Liquid Administration via --  Medication Administration Via alternative means  Compensations --  Postural Changes --      CHL IP OTHER RECOMMENDATIONS 09/24/2018  Recommended Consults --  Oral Care Recommendations Oral care QID  Other Recommendations --      CHL IP FOLLOW UP RECOMMENDATIONS 09/24/2018  Follow up Recommendations Skilled Nursing facility      [facility] IP FREQUENCY AND DURATION 09/24/2018  Speech Therapy Frequency (ACUTE ONLY) min 3x week  Treatment Duration 2 weeks           CHL IP ORAL PHASE 09/24/2018  Oral Phase Impaired  Oral - Pudding Teaspoon --  Oral - Pudding Cup --  Oral - Honey Teaspoon Piecemeal swallowing;Lingual/palatal residue  Oral - Honey Cup --  Oral - Nectar Teaspoon Piecemeal swallowing  Oral - Nectar Cup --  Oral - Nectar Straw --  Oral - Thin Teaspoon --  Oral - Thin Cup --  Oral - Thin Straw --  Oral - Puree Lingual/palatal residue;Piecemeal swallowing  Oral - Mech Soft --  Oral - Regular Lingual/palatal residue;Piecemeal swallowing;Delayed oral transit  Oral - Multi-Consistency --  Oral - Pill --  Oral Phase - Comment --    CHL IP PHARYNGEAL PHASE 09/24/2018  Pharyngeal Phase Impaired  Pharyngeal- Pudding Teaspoon --  Pharyngeal --  Pharyngeal- Pudding Cup --  Pharyngeal --   Pharyngeal- Honey Teaspoon --  Pharyngeal --  Pharyngeal- Honey Cup Delayed swallow initiation-pyriform sinuses;Penetration/Aspiration before swallow  Pharyngeal Material enters airway, CONTACTS cords and not ejected out  Pharyngeal- Nectar Teaspoon --  Pharyngeal --  Pharyngeal- Nectar Cup Delayed swallow initiation-pyriform sinuses;Reduced airway/laryngeal closure;Penetration/Aspiration before swallow  Pharyngeal Material enters airway, passes BELOW cords without attempt by patient to eject out (silent aspiration)  Pharyngeal- Nectar Straw --  Pharyngeal --  Pharyngeal- Thin Teaspoon --  Pharyngeal --  Pharyngeal- Thin Cup --  Pharyngeal --  Pharyngeal- Thin Straw --  Pharyngeal --  Pharyngeal- Puree Delayed swallow initiation-pyriform sinuses;Penetration/Aspiration before swallow  Pharyngeal Material enters airway, CONTACTS cords and then ejected out;Material enters airway, CONTACTS cords and not ejected out  Pharyngeal- Mechanical Soft --  Pharyngeal --  Pharyngeal- Regular Delayed swallow initiation-pyriform sinuses  Pharyngeal --  Pharyngeal- Multi-consistency --  Pharyngeal --  Pharyngeal- Pill --  Pharyngeal --  Pharyngeal Comment --     No flowsheet data found.   [name] 09/24/2018, 1:56 PM

## 2018-09-24 NOTE — Social Work (Addendum)
CSW received a call from Debbora Lacrosse from [phone], she states she is pt's family member and "second healthcare power of attorney". She states [name] gave her my number to "talk about discharge." [place] information is not listed on facesheet, therefore explained that I am not able to discuss pt information over telephone without pt permission/her number being listed on facesheet. Rush Barer states understanding, also mentioned that there had been conversation with palliative medicine regarding disposition.  Palliative medicine referral line called to see if they had been meeting/met with this family member- Leonard Downing with palliative states that she has not spoken with this family member but does have a meeting set with Frita on Friday.  Will follow up with Con Memos regarding permission to speak with family member listed above.  4:37pm: Of note, CSW has reviewed all of the electronic chart for the following terms "HCPOA" "POA" "advanced directive" and has not found any information about those being completed. There is also not an advanced directive on pt's hard chart.  Westley Hummer, MSW, [place] Work [phone]

## 2018-09-24 NOTE — Social Work (Addendum)
At request of [name], CSW has reviewed referral with the following facilities  [facility]- declined due to care needs exceeding facility capability [facility]-declined due to care needs exceeding facility capability/LOG [facility]- will not accept [place]- declined due to care needs exceeding facility capability. [facility]-declined after review with administration [facility]- will not accept [facility]- will not accept [place]- will not accept LOG [facility]- will not accept [place]- will not accept [facility]- will not accept LOG [facility]- will not accept LOG  [facility], [place], and [facility] facilities will review with their administration regarding offer of LOG. CSW will continue to review. Have followed up again with leadership at [facility] regarding potential LOG since CSW has not heard back regarding confirmation of offer.   11:18am- [facility] facilities [facility], [place], [place]) have declined and will not accept LOG. [place] [facility] have also both declined. [facility] is reviewing pt under LOG.  11:33am- [facility] reviewed clinicals with administration, they are unable to accept pt under LOG.  Westley Hummer, MSW, [place] Work [phone]

## 2018-09-24 NOTE — Progress Notes (Signed)
No charge note:   Received call from MD with request for Palliative reengagement. I called patient's primary contact- [name] requesting to meet with the patient with her present. [name] stated she could meet Friday 11/22 at 10am.  I will plan to meet with patient individually with patient tomorrow prior to meeting with patient's sister present so that patient can fully express her [place].  Mariana Kaufman, AGNP-C Palliative Medicine  Please call Palliative Medicine team phone with any questions [phone]. For individual providers please see AMION.

## 2018-09-24 NOTE — Progress Notes (Signed)
Occupational Therapy Treatment Patient Details Name: Andrea [name] MRN: [id number] DOB: 13-Mar-1961 Today's Date: 09/24/2018    History of present illness 57 yo presented to ED with chest pain noted Rt hemiparesis in ED with acute Left paramedian brainstem infarct. Intubated 10/10 for angioplasty, extubated post procedure and reintubated. Repeat MRI with brainstem infarct extension and bil cerebellar infarcts. Peg/trach with return to vent 09/01/2018. 10/22-29 on trach collar, return to vent 10/29. 10/28 I & D of abdominal wall abscess with placement of penrose drain and G-tube, 11/1 repeat ex lap. 11/7 return to trach collar.  PMHx: HTN, DM, CKD   OT comments  Focus of session addressing bed mobility with total A +2 and using Maximove to transfer to Tilt in [place] wheelchair in order for pt to visit with therapy dog. Pt appeared to enjoy visit. Pt less tearful today. Encouraged pt to try to find a few positive things that happened today to brighten her spirits given her current level of deficits. Pt again stating she wants to go home and again educated pt on need to go to rehab.  Encourage nsg to get pt up daily with use of Maximove to Tilt in [place] wc.   Follow Up Recommendations  SNF;Supervision/Assistance - 24 hour    Equipment Recommendations  Other (comment)    Recommendations for Other Services      Precautions / Restrictions Precautions Precautions: Fall Precaution Comments: Peg/trach, abdominal binder        Mobility Bed Mobility Overal bed mobility: Needs Assistance Bed Mobility: Rolling Rolling: Total assist;+2 for physical assistance         General bed mobility comments: facilitation with head turn and scapular movements to assist with rolling; Maximove used to lift pt OOB to Tilt in [place] chair  Transfers                 General transfer comment: Maximove used to transfer pt to chair    Balance     Sitting balance-Leahy Scale: Poor                                      ADL either performed or assessed with clinical judgement   ADL       Grooming: Maximal assistance Grooming Details (indicate cue type and reason): rubbing lotion on hand/leg                               General ADL Comments: overall total A for ADL; using soft touch call bell with pt holding ball to push onto bell for increased accuracy     Vision       Perception     Praxis      Cognition Arousal/Alertness: Awake/alert Behavior During Therapy: WFL for tasks assessed/performed(tearful at times) Overall Cognitive Status: Impaired/Different from baseline Area of Impairment: Attention;Memory;Safety/judgement;Awareness;Problem solving                   Current Attention Level: Selective Memory: Decreased short-term memory Following Commands: Follows one step commands consistently Safety/Judgement: Decreased awareness of safety;Decreased awareness of deficits Awareness: Emergent Problem Solving: Slow processing          Exercises     Shoulder Instructions       General Comments      Pertinent Vitals/ Pain       Pain Assessment: Faces Faces  Pain Scale: Hurts little more Pain Location: general discomfort Pain Descriptors / Indicators: Discomfort;Grimacing Pain Intervention(s): Limited activity within patient's tolerance  Home Living                                          Prior Functioning/Environment              Frequency  Min 2X/week        Progress Toward Goals  OT Goals(current goals can now be found in the care plan section)  Progress towards OT goals: Progressing toward goals  Acute Rehab OT Goals Patient Stated Goal: per family for pt to get stronger OT Goal Formulation: With patient/family Time For Goal Achievement: 09/26/18 Potential to Achieve Goals: Fair ADL Goals Pt Will Perform Grooming: with max assist;bed level Pt Will Perform Upper Body Bathing: with  max assist;bed level Pt/caregiver will Perform Home Exercise Program: Increased ROM;With written HEP provided;Both right and left upper extremity Additional ADL Goal #1: Pt will sit EOB with +2 Max A x 10 min in preparation for functional activities. Additional ADL Goal #3: Pt will tolerate 2 hrs out of bed in whellchair to increase activity tolerance.  Plan Discharge plan remains appropriate    Co-evaluation                 AM-PAC PT "6 Clicks" Daily Activity     Outcome Measure   Help from another person eating meals?: Total Help from another person taking care of personal grooming?: Total Help from another person toileting, which includes using toliet, bedpan, or urinal?: Total Help from another person bathing (including washing, rinsing, drying)?: Total Help from another person to put on and taking off regular upper body clothing?: Total Help from another person to put on and taking off regular lower body clothing?: Total 6 Click Score: 6    End of Session Equipment Utilized During Treatment: Oxygen  OT Visit Diagnosis: Other abnormalities of gait and mobility (R26.89);Muscle weakness (generalized) (M62.81);Low vision, both eyes (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);Feeding difficulties (R63.3);Other symptoms and signs involving cognitive function;Hemiplegia and hemiparesis;Pain Hemiplegia - Right/Left: Right Hemiplegia - dominant/non-dominant: Non-Dominant Hemiplegia - caused by: Nontraumatic intracerebral hemorrhage;Cerebral infarction   Activity Tolerance Patient tolerated treatment well   Patient Left Other (comment)(wheelchair)   Nurse Communication Mobility status;Other (comment)(wheelchair positioning and need to be OOB daily)        Time: 9675-9163 OT Time Calculation (min): 45 min  Charges: OT General Charges $OT Visit: 1 Visit OT Treatments $Therapeutic Activity: 8-22 mins  Maurie Boettcher, OT/L   Acute OT Clinical Specialist [place] Pager  [phone] Office [phone]    [facility] 09/24/2018, 2:32 PM

## 2018-09-24 NOTE — Progress Notes (Addendum)
STROKE TEAM PROGRESS NOTE   SUBJECTIVE (INTERVAL HISTORY) No family is at bedside. Pt is working with OT and speech and was lifted to wheelchair with device. Dog "Bodi" visited pt in the hallway. Pt on speaking valve. As per OT, pt expressed herself that she does not want to live with this disability if she can not walk. Will have palliative care come back to talk with her. So far all SNFs have declined placement.    OBJECTIVE Vitals:   09/24/18 0621 09/24/18 0747 09/24/18 0830 09/24/18 1120  BP: (!) 156/69 (!) 169/68 (!) 160/61 (!) 160/61  Pulse:  75 75 74  Resp:  (!) 21 (!) 24 16  Temp:  98.1 F (36.7 C)    TempSrc:  Oral    SpO2:  100% 100% 100%  Weight:      Height:        CBC:  Recent Labs  Lab 09/20/18 0323  09/23/18 0435 09/24/18 0442  WBC 9.5   < > 10.0 9.1  NEUTROABS 6.7  --   --   --   HGB 7.7*   < > 8.5* 8.3*  HCT 26.5*   < > 28.0* 28.1*  MCV 85.5   < > 87.0 87.8  PLT 503*   < > 392 371   < > = values in this interval not displayed.    Basic Metabolic Panel:  Recent Labs  Lab 09/20/18 0323  09/23/18 0435 09/24/18 0442  NA 133*   < > 140 138  K 4.6   < > 4.4 4.2  CL 105   < > 110 110  CO2 22   < > 23 20*  GLUCOSE 143*   < > 127* 122*  BUN 35*   < > 25* 27*  CREATININE 0.85   < > 0.68 0.66  CALCIUM 10.1   < > 10.3 10.4*  PHOS 3.5  --   --   --    < > = values in this interval not displayed.    Lipid Panel:     Component Value Date/Time   CHOL 256 (H) 08/15/2018 0512   TRIG 248 (H) 08/15/2018 0512   HDL 35 (L) 08/15/2018 0512   CHOLHDL 7.3 08/15/2018 0512   VLDL 50 (H) 08/15/2018 0512   LDLCALC 171 (H) 08/15/2018 0512   HgbA1c:  Lab Results  Component Value Date   HGBA1C 11.4 (H) 08/15/2018   Urine Drug Screen:     Component Value Date/Time   LABOPIA NONE DETECTED 08/07/2018 1836   COCAINSCRNUR NONE DETECTED 09/01/2018 1836   LABBENZ NONE DETECTED 08/05/2018 1836   AMPHETMU NONE DETECTED 08/13/2018 1836   THCU NONE DETECTED 08/18/2018  1836   LABBARB NONE DETECTED 08/22/2018 1836    Alcohol Level No results found for: ETH  IMAGING  Ct Angio Head W Or Wo Contrast Ct Angio Neck W Or Wo Contrast 08/15/2018 IMPRESSION:  1. Interval basilar to left proximal PCA stenting. The density of the stent walls precludes detection of in stent stenosis; there is a degree of wasting at the mid basilar segment. There is flow in the left PCA beyond the stent such that there is presumed stent patency.  2. Known lower pontine infarct. There are new small cerebellar infarcts since brain MRI yesterday.  3. Severe left P2 segment stenosis, also seen previously.  4. Moderate atheromatous narrowing in the proximal left MCA.    Ct Angio Head W Or Wo Contrast Ct Angio Neck W Or Wo  Contrast 08/27/2018 IMPRESSION:  1. 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 No definite acute thrombus identified.  2. Severe stenosis left posterior cerebral artery and moderate stenosis right posterior cerebral artery.  3. Mild stenosis left MCA and moderate stenosis left MCA bifurcation.  4. No significant carotid or vertebral artery stenosis in the neck.    Ct Head Wo Contrast 08/05/2018 IMPRESSION:  No acute intracranial abnormalities. Mild white matter changes likely due to small vessel ischemia.    Mr Brain Wo Contrast 08/24/2018 IMPRESSION:  1. Acute nonhemorrhagic infarct involving the left paramedian brainstem. The infarct crosses midline.  2. Other periventricular and subcortical white matter disease is moderately advanced for age. This likely reflects the sequela of chronic microvascular ischemia.  3. Tapering of the dens with prominent soft tissue pannus. 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    Ct Head Code Stroke Wo Contrast 08/13/2018  IMPRESSION:  1. No acute abnormality and no change from earlier today  2. ASPECTS is 10 3.    Cerebral  Angiogram 08/25/2018 S/P 4 vessel cerebral arteriogram RT CFA approach. 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 S/P stent assisted angioplasty of distal basilar artery with patency of 80 to 90 %. 2.Approx 70 % stenosis of LT ICA supraclinoid seg   MRI 08/15/18 1. Progressive acute pontine infarct. New patchy bilateral cerebellar infarction. New tiny left thalamic infarcts. 2. Preserved flow void in the stented basilar. 3. left facial/submandibular edema, please correlate with neck exam. Edited result: IMPRESSION: 1. Progressive acute pontine infarct. New patchy bilateral cerebellar infarction. New tiny left thalamic infarcts. 2. Preserved flow void in the stented basilar.  TTE - Left ventricle: The cavity size was normal. There was severe   concentric hypertrophy. Systolic function was normal. The   estimated ejection fraction was in the range of 60% to 65%. Wall   motion was normal; there were no regional wall motion   abnormalities. Doppler parameters are consistent with abnormal   left ventricular relaxation (grade 1 diastolic dysfunction). The   E/e&' ratio is <8, suggesting normal LV filling pressure. - Aortic valve: Trileaflet; mildly calcified leaflets.   Transvalvular velocity was minimally increased. There was no   regurgitation. Mean gradient (S): 12 mm Hg. - Mitral valve: Mildly thickened leaflets . There was trivial   regurgitation. - Left atrium: The atrium was normal in size. - Inferior vena cava: The vessel was dilated. The respirophasic   diameter changes were blunted (< 50%), consistent with elevated   central venous pressure. Impressions: - LVEF 60-65%, severe LVH, normal wall motion, grade 1 DD, normal   LV filling pressure, minimally increased aortic velocity without   signficant stenosis, trivial MR, normal LA size, dilated IVC.  CT abdomen and  pelvis 09/03/18 Mild bilateral posterior basilar subsegmental atelectasis. Interval placement of new gastrostomy tube with tip and balloon within gastric lumen. Surgical drain is seen in the soft tissues in previous gastrostomy site. Small amount of gas and stranding is seen in the peritoneal fat anterior and inferior to the stomach which most likely is related to gastrostomy placement. Probable surgical wound seen in right upper quadrant of the anterior abdominal wall. 8.2 x 2.3 cm gas and fluid collection is seen in the anterior abdominal wall which is unchanged compared to prior exam. Possible abscess cannot be excluded. Enlarged fibroid uterus.  PHYSICAL EXAM  Temp:  [97.5 F (36.4 C)-99.7 F (37.6 C)] 98.1 F (36.7 C) (11/20 0747) Pulse Rate:  [71-85] 74 (11/20 1120) Resp:  [16-27] 16 (11/20 1120) BP: (146-174)/(48-86) 160/61 (11/20 1120) SpO2:  [96 %-100 %] 100 % (11/20 1120) FiO2 (%):  [28 %-35 %] 28 % (11/20 1120)  General -  Obese middle-aged African-American lady, on trach collar, not in distress. She has abdominal surgical wound and drainage from the abdominal wall, dressing clean  Ophthalmologic - fundi not visualized due to noncooperation.  Cardiovascular - regular rate and rhythm  Neuro - on trach collar, eyes open, awake, alert. No ptosis or EOMI, denies diplopia.  PERRL. Bilateral facial weakness, tongue protrusion weak. Able to wiggle left hand fingers and left foot toes. 2-/5 LLE and 2/5 LUE. Follows commands and good eye contact.  Speech is moderate dysarthric with speaking valve.   Dense right hemiplegia persists.  ASSESSMENT/PLAN Ms. ALTAGRACIA RONE is a 57 y.o. female with history of difficult to control hypertension, insulin-dependent diabetes, obesity, hypothyroidism status post ablation presenting with chest discomfort, right-sided weakness, slurred speech and right facial droop. She did not receive IV t-PA due to late presentation. S/P stent assisted  angioplasty of distal basilar artery.  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 Worsening symptoms with extension of pontine/medullary infarcts with new small bilateral cerebellar infarcts without evidence of stent re-stenosis or occlusion  Resultant quadriplegia, on trach and peg  CT head - No acute intracranial abnormalities.  CTA H&N 08/05/2018 - Extensive atherosclerotic disease in the basilar with focal critical stenosis in the mid to distal basilar. No definite acute thrombus identified. Severe stenosis left posterior cerebral artery  MRI head - Acute nonhemorrhagic infarct involving the left paramedian brainstem.   CTA H&N 08/15/2018 - stent too dense to see BA lumen, but distal flow preserved. New small cerebellar infarcts since brain MRI yesterday.  Severe left P2 segment stenosis, also seen previously.   Repeat MRI - extension of b/l pontine and upper medullary infarcts with new b/l cerebellar infarcts.  2D Echo - EF 60-65%  LDL - 174  HgbA1c - 11.1  VTE prophylaxis - heparin subq  aspirin 81 mg daily prior to admission, now resumed on Brilinta 90 mg bid and ASA 81 mg daily.   Patient counseled to be compliant with her antithrombotic medications  Ongoing aggressive stroke risk factor management  Therapy recommendations:  LTACH  Disposition:  SNF  Placement - pt expressed to OT that pt would not want to live like this if she can not walk again - will have palliative care service re-visit goal of care.   Abnormal uterine bleeding, improved  Has been following with GYN  Was on megace in the past  CT abd/pelvis 09/03/18 - enlarged fibroid uterus  continue megace 80mg  bid  Continue ASA and brilinta for now  OBGYN recs appreciated  Gave prmarin 25mg  IV as recommended - vaginal bleeding much improved  Close CBC monitoring  Anemia   Hb 6.8->PRBC->7.1->9.0-7.7->7.0->PRBC->8.6->8.5->8.3  Likely due to iron  deficiency, heavy periods and acute uterine bleeding and recent surgeries  Iron panel showed iron deficiency  On iron solutions  PRBC transfusion 1 U 09/20/2018 and 2U 09/07/18 and 2U 09/21/18  CBC monitoring  Intracranial stenosis  BA mid to distal severe stenosis s/p BA stenting  Left PCA severe stenosis, R PCA moderate stenosis  Left MCA moderate stenosis  Uncontrolled stroke risk factors with HLD, HTN, DM  Non compliance with meds at home  Respiratory failure  S/p trach 08/30/2018  On trach collar now  Able to speak with speaking valve although dysarthric  Working with speech therapist for speaking valve  Hypertension  Stable   BP goal 130-150   On Coreg 25 bid, amlodipine and hydralazine  Close monitoring  Hyperlipidemia  Lipid lowering medication PTA:  Lipitor 20 mg daily  LDL 174, goal < 70  Increased to 80 mg daily  Continue statin at discharge  Diabetes  HgbA1c 11.4, goal < 7.0  Uncontrolled at home  Glucose stable on insulin   Decrease Lantus again today to 18U bid   Decrease basal NovoLog again today to 4U Q4h  SSI  CBG monitoring  Low grade fever with leukocytosis, resolved  Tmax 101.6 -> afebrile  Leukocytosis - 14.0->12.1->9.9->11.2->11.5->11.3->9.5->9.9  UA WBC 21-50  CXR unremarkable, improving from prior  Blood cultures negative 10/26   Respiratory cultures - mixed flora   Abdominal abscess culture showed proteus mirabilis  Off antibiotics  Hypernatremia -> hyponatremia-> resolved  Na 149->148->150->147->144->143->134->133->137->140->138  On TF @ 45  on NS @ 40  Continue BMP monitoring  Dysphagia s/p PEG complicated by abdominal wall abscess  Continue tube feeding @ 45 cc/h  PEG done 08/06/2018  Removed and replaced dislodged PEG 08/28/2018  CT showed Abdominal wall abscess - s/p open surgical drainage 08/26/2018 persistent foul odor discharge. Reexploration 09/20/2018  Culture showed peroteus  mirabilis  Off meropenum  Trauma surgery following, appreciate recs  Wound debrided appropriately and sutures removed.  No evisceration  Other Stroke Risk Factors  Obesity, Body mass index is 46.55 kg/m., recommend weight loss, diet and exercise as appropriate   Other Active Problems  Hypokalemia - resolved  Thrombocytosis - likely due to anemia, resolved  Hospital day # 58  Andrea Hawking, MD PhD Stroke Neurology 09/24/2018 11:47 AM   To contact Stroke Continuity provider, please refer to [email or web address] After hours, contact General Neurology

## 2018-09-25 LAB — GLUCOSE, CAPILLARY
GLUCOSE-CAPILLARY: 111 mg/dL — AB (ref 70–99)
Glucose-Capillary: 91 mg/dL (ref 70–99)
Glucose-Capillary: 106 mg/dL — ABNORMAL HIGH (ref 70–99)
Glucose-Capillary: 125 mg/dL — ABNORMAL HIGH (ref 70–99)
Glucose-Capillary: 121 mg/dL — ABNORMAL HIGH (ref 70–99)

## 2018-09-25 MED ORDER — ESCITALOPRAM OXALATE 10 MG PO TABS
10.0000 mg | ORAL_TABLET | Freq: Every day | ORAL | Status: DC
  Administered 2018-09-26 – 2018-11-13 (×49): 10 mg
  Filled 2018-09-25 (×50): qty 1

## 2018-09-25 MED ORDER — LOPERAMIDE HCL 2 MG PO CAPS: 4.0000 mg | ORAL_CAPSULE | Freq: Four times a day (QID) | ORAL | Status: DC | PRN

## 2018-09-25 MED ORDER — INSULIN GLARGINE 100 UNIT/ML ~~LOC~~ SOLN
15.0000 [IU] | Freq: Two times a day (BID) | SUBCUTANEOUS | Status: DC
  Administered 2018-09-25 – 2018-11-02 (×75): 15 [IU] via SUBCUTANEOUS
  Filled 2018-09-25 (×78): qty 0.15

## 2018-09-25 MED ORDER — INSULIN ASPART 100 UNIT/ML ~~LOC~~ SOLN
3.0000 [IU] | SUBCUTANEOUS | Status: DC
  Administered 2018-09-25 – 2018-10-19 (×120): 3 [IU] via SUBCUTANEOUS

## 2018-09-25 MED ORDER — ESCITALOPRAM OXALATE 10 MG PO TABS
10.0000 mg | ORAL_TABLET | Freq: Every day | ORAL | Status: DC
  Administered 2018-09-25: 10 mg via ORAL

## 2018-09-25 MED ORDER — LORAZEPAM 0.5 MG PO TABS
0.2500 mg | ORAL_TABLET | Freq: Every day | ORAL | Status: DC
  Administered 2018-09-25 – 2018-11-12 (×49): 0.25 mg
  Filled 2018-09-25 (×50): qty 1

## 2018-09-25 MED ORDER — VITAL AF 1.2 CAL PO LIQD
1000.0000 mL | ORAL | Status: DC
  Administered 2018-09-25 – 2018-09-27 (×2): 1000 mL
  Filled 2018-09-25: qty 1000

## 2018-09-25 NOTE — Social Work (Addendum)
All SNFs contacted have declined either due to pt care needs (trach, feeding tube, wound care) or they do not accept LOG placements. [facility] states they are still reviewing pt clinicals for acceptance under LOG. Updated them that there would be a goals of care conversation with pt family tomorrow.  Continuing to follow and await information from [place] conversation.  2:35pm- Spoke with Leonard Downing, have requested to join conversation with [place] tomorrow. Explained CSW challenge with finding placement, what a letter of guarantee (LOG) is and let Leonard Downing know about previous conversation with [place] about [facility]. Leonard Downing is welcoming to [place] join. Will meet pt, pt sister, and PMT in the room tomorrow at [place], MSW, [place] Work [phone]

## 2018-09-25 NOTE — Progress Notes (Signed)
Nutrition Follow-up  DOCUMENTATION CODES:   Morbid obesity  INTERVENTION:   -D/c Glucerna 1.2  -Initiate Vital AF 1.2 @ 65 ml/hr via PEG  Tube feeding regimen provides 1872 kcal (>100% of needs), 117 grams of protein, and 1265 ml of H2O.   NUTRITION DIAGNOSIS:   Inadequate oral intake related to inability to eat as evidenced by NPO status.  Ongoing  GOAL:   Patient will meet greater than or equal to 90% of their needs  Met with TF  MONITOR:   Diet advancement, Labs, Weight trends, Skin, I & O's  REASON FOR ASSESSMENT:   Consult Enteral/tube feeding initiation and management  ASSESSMENT:   Andrea [name] is a 57 yo female with PMH of type 2 diabetes, HTN, CKD III, obesity, and anemia admitted for chest pain. Code Stroke called 10/10 in AM. 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 Intubated 10/10 in ICU.   10/21 trach and PEG 10/28 ex lap for PEG dislodgement, PEG replaced 11/1 ex lap, I&D 11/5- per Andrea [name] notes, pressure injury to labia has healed 11/7- BSE- recommend NPO with continued long term nutrition support 11/8- off vent for 24 hours, on trach collar 11/11- s/p FEES- remain NPO with trials of puddings and purees with SLP; transferred from ICU to PCU 11/12- downsized to #4 cuffless trach 11/20- s/p repeat FEES- recommend continued NPO with trials of purees with nectar thick liquids with SLP  Reviewed I/O's: +231 ml x 24 hours and +523 ml since 09/11/18  Paged by Dr. Erlinda Hong; requesting this RD re-evaluate pt TF regimen. Pt with multiple areas of skin breakdown and has been having persistent diarrhea (pt has been off antibiotics). MD has added imodium.   Pt sleeping soundly at time of visit; on trach collar.   Glucerna 1.2 infusing via PEG at 45 ml/hr. Pt also receiving 60 ml Prostat BID. Complete regimen providing 1696 kcals, 124 grams protein, and 875 ml free water. Noted pt receiving 163 total grams of carbohydrate with TF regimen. Complete regimen providing  100% of estimated nutritional needs.   CSW following; awaiting acceptance to SNF on LOG.   Family meeting is scheduled to with palliative care tomorrow (09/26/18) at 10 AM to discuss goals of care.   Labs reviewed: CBGS: 91-125 (inpatient orders for glycemic control are 0-20 units insulin aspart every 4 hours, 3 units insulin aspart every 4 hours, and 15 units insulin glargine BID).   Diet Order:   Diet Order            Diet NPO time specified  Diet effective ____              EDUCATION NEEDS:   Not appropriate for education at this time  Skin:  Skin Assessment: Skin Integrity Issues: Skin Integrity Issues:: Incisions Stage II: healed Incisions: abd  Last BM:  09/25/18  Height:   Ht Readings from Last 1 Encounters:  09/03/18 _0  (1.626 m)    Weight:   Wt Readings from Last 1 Encounters:  09/19/18 123 kg    Ideal Body Weight:  54.5 kg  BMI:  Body mass index is 46.55 kg/m.  Estimated Nutritional Needs:   Kcal:  1600-1800  Protein:  120-135 grams  Fluid:  > 1.6 L    Kourtnei Rauber A. Jimmye Norman, RD, LDN, CDE Pager: [phone] After hours Pager: [phone]

## 2018-09-25 NOTE — Progress Notes (Signed)
Physical Therapy Treatment Patient Details Name: Andrea [name] MRN: [id number] DOB: 1961/03/07 Today's Date: 09/25/2018    History of Present Illness 57 yo presented to ED with chest pain noted Rt hemiparesis in ED with acute Left paramedian brainstem infarct. Intubated 10/10 for angioplasty, extubated post procedure and reintubated. Repeat MRI with brainstem infarct extension and bil cerebellar infarcts. Peg/trach with return to vent 08/22/2018. 10/22-29 on trach collar, return to vent 10/29. 10/28 I & D of abdominal wall abscess with placement of penrose drain and G-tube, 11/1 repeat ex lap. 11/7 return to trach collar.  PMHx: HTN, DM, CKD    PT Comments    Pt stating she has increased neck pain today, that she has been uncomfortable with stools and painful due to not being able to move. Pt stating she didn't want to get in tilt in space chair or get out of the room this session and stated she didn't want to get out and see the nurses. Pt able to tolerate increased tilt into upright compared to last session with this therapist. Pt incontinent of stool in standing limiting tilt time and need for pericare. Will attempt tilt in space next session if pt agreeable. Pt with limited right quad activation and minimal RUE movement without notable change. PMSV utilized throughout session with VSS. BP 211/117 in standing, end of session 153/93    Follow Up Recommendations  SNF;Supervision/Assistance - 24 hour     Equipment Recommendations  Hospital bed;Wheelchair (measurements PT);Other (comment)    Recommendations for Other Services       Precautions / Restrictions Precautions Precautions: Fall Precaution Comments: Peg/trach, abdominal binder     Mobility  Bed Mobility Overal bed mobility: Needs Assistance Bed Mobility: Rolling Rolling: Total assist;+2 for physical assistance   Supine to sit: Total assist;+2 for physical assistance     General bed mobility comments: total assist to  roll bil with physical assist to bend knee and rotate trunk and pelvis. Bed positioning used for semi chair position as pt not tolerating full chair in bed and refused OOB to tilt in space this session  Transfers Overall transfer level: Needs assistance   Transfers: Sit to/from Stand Sit to Stand: Total assist;+2 safety/equipment         General transfer comment: tilt bed utilized with pt standing grossly 80 degrees for 6 min with VSS and performing left quad contraction in standing  Ambulation/Gait             General Gait Details: unable   Stairs             Wheelchair Mobility    Modified Rankin (Stroke Patients Only) Modified Rankin (Stroke Patients Only) Pre-Morbid Rankin Score: No symptoms Modified Rankin: Severe disability     Balance Overall balance assessment: Needs assistance   Sitting balance-Leahy Scale: Zero       Standing balance-Leahy Scale: Zero                              Cognition Arousal/Alertness: Awake/alert Behavior During Therapy: WFL for tasks assessed/performed Overall Cognitive Status: Impaired/Different from baseline Area of Impairment: Attention;Memory;Safety/judgement;Awareness;Problem solving                   Current Attention Level: Selective Memory: Decreased short-term memory Following Commands: Follows one step commands consistently       General Comments: pt frustrated stating the nurses aren't helping her and then voicing that she doesn't  want to do anything. pt encouraged to participate and make her needs known      Exercises General Exercises - Lower Extremity Quad Sets: AROM;10 reps;Standing;Left    General Comments        Pertinent Vitals/Pain Pain Score: 5  Pain Location: neck Pain Descriptors / Indicators: Discomfort;Grimacing Pain Intervention(s): Limited activity within patient's tolerance;Repositioned    Home Living                      Prior Function             PT Goals (current goals can now be found in the care plan section) Progress towards PT goals: Progressing toward goals    Frequency    Min 2X/week      PT Plan Current plan remains appropriate    Co-evaluation              AM-PAC PT "6 Clicks" Daily Activity  Outcome Measure  Difficulty turning over in bed (including adjusting bedclothes, sheets and blankets)?: Unable Difficulty moving from lying on back to sitting on the side of the bed? : Unable Difficulty sitting down on and standing up from a chair with arms (e.g., wheelchair, bedside commode, etc,.)?: Unable Help needed moving to and from a bed to chair (including a wheelchair)?: Total Help needed walking in hospital room?: Total Help needed climbing 3-5 steps with a railing? : Total 6 Click Score: 6    End of Session Equipment Utilized During Treatment: Oxygen;Other (comment)(tilt bed) Activity Tolerance: Patient tolerated treatment well Patient left: in bed;with call bell/phone within reach Nurse Communication: Mobility status;Need for lift equipment PT Visit Diagnosis: Other abnormalities of gait and mobility (R26.89);Muscle weakness (generalized) (M62.81);Other symptoms and signs involving the nervous system (R29.898);Hemiplegia and hemiparesis Hemiplegia - Right/Left: Right Hemiplegia - dominant/non-dominant: Dominant Hemiplegia - caused by: Cerebral infarction     Time: 0813-0902 PT Time Calculation (min) (ACUTE ONLY): 49 min  Charges:  $Therapeutic Activity: 38-52 mins                     [place], PT Acute Rehabilitation Services Pager: [phone] Office: [phone]    Andrea [name] 09/25/2018, 10:57 AM

## 2018-09-25 NOTE — Progress Notes (Signed)
Patient ID: Andrea [name], female   DOB: 04-18-1961, 57 y.o.   MRN: [id number] I&D sites clean Midline wound is cleaner  S/p PEG placement 10/21 S/p exploratory laparotomy with open gastrostomy tube and I&D 10/28 S/p takeback for repeat I&D abdominal wall + exlap 11/1 - Continue wet to dry dressingsand TF - continue abdominal binder  I spoke with Dr. Erlinda Hong Palliative is seeing her again  Georganna Skeans, MD, MPH, [place] Trauma: [phone] General Surgery: [phone]

## 2018-09-25 NOTE — Progress Notes (Signed)
Patient had requested scripture to be read.  Read several comforting scriptures and sang some songs, Swing Low, Sweet Chariot, Give Me Jesus, Leone Payor and prayed for patient to have peace of heart and mind and for staff as they care for her and others. Conard Novak, Chaplain   09/25/18 1200  Clinical Encounter Type  Visited With Patient  Visit Type Initial;Spiritual support;Critical Care  Referral From Patient  Consult/Referral To [facility] text;Prayer;Other (Comment) (Read scriptures-patient specifically asked for scripture )  Stress Factors  Patient Stress Factors Not reviewed;Other (Comment) (patient not able to talk)  Family Stress Factors None identified

## 2018-09-25 NOTE — Progress Notes (Deleted)
Read several comforting scriptures for patient.  Andrea [name] a few songs Swing Low, Sweet Chariot, Give Me Jesus, Leone Payor and had prayer for patient to have peace of heart and mind and for staff as they work so hard to care for patients.Conard Novak, Chaplain

## 2018-09-25 NOTE — Plan of Care (Signed)
  Problem: Education: Goal: Knowledge of General Education information will improve Description Including pain rating scale, medication(s)/side effects and non-pharmacologic comfort measures Outcome: Progressing   Problem: Health Behavior/Discharge Planning: Goal: Ability to manage health-related needs will improve Outcome: Progressing   Problem: Clinical Measurements: Goal: Ability to maintain clinical measurements within normal limits will improve Outcome: Progressing Goal: Will remain free from infection Outcome: Progressing Goal: Diagnostic test results will improve Outcome: Progressing Goal: Respiratory complications will improve Outcome: Progressing Goal: Cardiovascular complication will be avoided Outcome: Progressing   Problem: Activity: Goal: Risk for activity intolerance will decrease Outcome: Progressing   Problem: Nutrition: Goal: Adequate nutrition will be maintained Outcome: Progressing   Problem: Coping: Goal: Level of anxiety will decrease Outcome: Progressing   Problem: Elimination: Goal: Will not experience complications related to bowel motility Outcome: Progressing Goal: Will not experience complications related to urinary retention Outcome: Progressing   Problem: Pain Managment: Goal: General experience of comfort will improve Outcome: Progressing   Problem: Safety: Goal: Ability to remain free from injury will improve Outcome: Progressing   Problem: Skin Integrity: Goal: Risk for impaired skin integrity will decrease Outcome: Progressing   Problem: Education: Goal: Understanding of cardiac disease, CV risk reduction, and recovery process will improve Outcome: Progressing Goal: Individualized Educational Video(s) Outcome: Progressing   Problem: Activity: Goal: Ability to tolerate increased activity will improve Outcome: Progressing   Problem: Cardiac: Goal: Ability to achieve and maintain adequate cardiovascular perfusion will  improve Outcome: Progressing   Problem: Health Behavior/Discharge Planning: Goal: Ability to safely manage health-related needs after discharge will improve Outcome: Progressing   Problem: Education: Goal: Knowledge of disease or condition will improve Outcome: Progressing Goal: Knowledge of secondary prevention will improve Outcome: Progressing Goal: Knowledge of patient specific risk factors addressed and post discharge goals established will improve Outcome: Progressing Goal: Individualized Educational Video(s) Outcome: Progressing   Problem: Coping: Goal: Will verbalize positive feelings about self Outcome: Progressing Goal: Will identify appropriate support needs Outcome: Progressing   Problem: Health Behavior/Discharge Planning: Goal: Ability to manage health-related needs will improve Outcome: Progressing   Problem: Self-Care: Goal: Ability to participate in self-care as condition permits will improve Outcome: Progressing Goal: Verbalization of feelings and concerns over difficulty with self-care will improve Outcome: Progressing Goal: Ability to communicate needs accurately will improve Outcome: Progressing   Problem: Nutrition: Goal: Risk of aspiration will decrease Outcome: Progressing Goal: Dietary intake will improve Outcome: Progressing   Problem: Ischemic Stroke/TIA Tissue Perfusion: Goal: Complications of ischemic stroke/TIA will be minimized Outcome: Progressing   Problem: Spiritual Needs Goal: Ability to function at adequate level Outcome: Progressing

## 2018-09-25 NOTE — Progress Notes (Signed)
STROKE TEAM PROGRESS NOTE   SUBJECTIVE (INTERVAL HISTORY) Dr. Grandville Silos and Palliative care service are at bedside. Pt sister has left for today. Pt still has copious secretions, will adequate suctioning, we put back speaking valve and pt is taking with palliative care services. Pt complains of diarrhea and pain in the anal area, I called dietitian Jenifer and she will consider to change her TF formula. Will d/c laxatives and put on imodium PRN   OBJECTIVE Vitals:   09/25/18 0748 09/25/18 0913 09/25/18 1100 09/25/18 1145  BP: (!) 181/78   (!) 156/89  Pulse: 77   76  Resp: (!) 21   20  Temp: 99 F (37.2 C)   99.9 F (37.7 C)  TempSrc: Oral   Axillary  SpO2: 100% 100% 100% 100%  Weight:      Height:        CBC:  Recent Labs  Lab 09/20/18 0323  09/23/18 0435 09/24/18 0442  WBC 9.5   < > 10.0 9.1  NEUTROABS 6.7  --   --   --   HGB 7.7*   < > 8.5* 8.3*  HCT 26.5*   < > 28.0* 28.1*  MCV 85.5   < > 87.0 87.8  PLT 503*   < > 392 371   < > = values in this interval not displayed.    Basic Metabolic Panel:  Recent Labs  Lab 09/20/18 0323  09/23/18 0435 09/24/18 0442  NA 133*   < > 140 138  K 4.6   < > 4.4 4.2  CL 105   < > 110 110  CO2 22   < > 23 20*  GLUCOSE 143*   < > 127* 122*  BUN 35*   < > 25* 27*  CREATININE 0.85   < > 0.68 0.66  CALCIUM 10.1   < > 10.3 10.4*  PHOS 3.5  --   --   --    < > = values in this interval not displayed.    Lipid Panel:     Component Value Date/Time   CHOL 256 (H) 08/15/2018 0512   TRIG 248 (H) 08/15/2018 0512   HDL 35 (L) 08/15/2018 0512   CHOLHDL 7.3 08/15/2018 0512   VLDL 50 (H) 08/15/2018 0512   LDLCALC 171 (H) 08/15/2018 0512   HgbA1c:  Lab Results  Component Value Date   HGBA1C 11.4 (H) 08/15/2018   Urine Drug Screen:     Component Value Date/Time   LABOPIA NONE DETECTED 09/02/2018 1836   COCAINSCRNUR NONE DETECTED 08/22/2018 1836   LABBENZ NONE DETECTED 08/29/2018 1836   AMPHETMU NONE DETECTED 09/04/2018 1836    THCU NONE DETECTED 08/17/2018 1836   LABBARB NONE DETECTED 08/12/2018 1836    Alcohol Level No results found for: ETH  IMAGING  Ct Angio Head W Or Wo Contrast Ct Angio Neck W Or Wo Contrast 08/15/2018 IMPRESSION:  1. Interval basilar to left proximal PCA stenting. The density of the stent walls precludes detection of in stent stenosis; there is a degree of wasting at the mid basilar segment. There is flow in the left PCA beyond the stent such that there is presumed stent patency.  2. Known lower pontine infarct. There are new small cerebellar infarcts since brain MRI yesterday.  3. Severe left P2 segment stenosis, also seen previously.  4. Moderate atheromatous narrowing in the proximal left MCA.    Ct Angio Head W Or Wo Contrast Ct Angio Neck W Or Wo Contrast 08/18/2018  IMPRESSION:  1. 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 No definite acute thrombus identified.  2. Severe stenosis left posterior cerebral artery and moderate stenosis right posterior cerebral artery.  3. Mild stenosis left MCA and moderate stenosis left MCA bifurcation.  4. No significant carotid or vertebral artery stenosis in the neck.    Ct Head Wo Contrast 09/04/2018 IMPRESSION:  No acute intracranial abnormalities. Mild white matter changes likely due to small vessel ischemia.    Mr Brain Wo Contrast 08/12/2018 IMPRESSION:  1. Acute nonhemorrhagic infarct involving the left paramedian brainstem. The infarct crosses midline.  2. Other periventricular and subcortical white matter disease is moderately advanced for age. This likely reflects the sequela of chronic microvascular ischemia.  3. Tapering of the dens with prominent soft tissue pannus. 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    Ct Head Code Stroke Wo Contrast 09/03/2018  IMPRESSION:  1. No acute abnormality and no change from earlier today  2. ASPECTS is 10 3.     Cerebral Angiogram 08/13/2018 S/P 4 vessel cerebral arteriogram RT CFA approach. 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 S/P stent assisted angioplasty of distal basilar artery with patency of 80 to 90 %. 2.Approx 70 % stenosis of LT ICA supraclinoid seg   MRI 08/15/18 1. Progressive acute pontine infarct. New patchy bilateral cerebellar infarction. New tiny left thalamic infarcts. 2. Preserved flow void in the stented basilar. 3. left facial/submandibular edema, please correlate with neck exam. Edited result: IMPRESSION: 1. Progressive acute pontine infarct. New patchy bilateral cerebellar infarction. New tiny left thalamic infarcts. 2. Preserved flow void in the stented basilar.  TTE - Left ventricle: The cavity size was normal. There was severe   concentric hypertrophy. Systolic function was normal. The   estimated ejection fraction was in the range of 60% to 65%. Wall   motion was normal; there were no regional wall motion   abnormalities. Doppler parameters are consistent with abnormal   left ventricular relaxation (grade 1 diastolic dysfunction). The   E/e&' ratio is <8, suggesting normal LV filling pressure. - Aortic valve: Trileaflet; mildly calcified leaflets.   Transvalvular velocity was minimally increased. There was no   regurgitation. Mean gradient (S): 12 mm Hg. - Mitral valve: Mildly thickened leaflets . There was trivial   regurgitation. - Left atrium: The atrium was normal in size. - Inferior vena cava: The vessel was dilated. The respirophasic   diameter changes were blunted (< 50%), consistent with elevated   central venous pressure. Impressions: - LVEF 60-65%, severe LVH, normal wall motion, grade 1 DD, normal   LV filling pressure, minimally increased aortic velocity without   signficant stenosis, trivial MR, normal LA size, dilated IVC.  CT  abdomen and pelvis 09/03/18 Mild bilateral posterior basilar subsegmental atelectasis. Interval placement of new gastrostomy tube with tip and balloon within gastric lumen. Surgical drain is seen in the soft tissues in previous gastrostomy site. Small amount of gas and stranding is seen in the peritoneal fat anterior and inferior to the stomach which most likely is related to gastrostomy placement. Probable surgical wound seen in right upper quadrant of the anterior abdominal wall. 8.2 x 2.3 cm gas and fluid collection is seen in the anterior abdominal wall which is unchanged compared to prior exam. Possible abscess cannot be excluded. Enlarged fibroid uterus.  PHYSICAL EXAM  Temp:  [97.9 F (36.6 C)-99.9 F (37.7 C)] 99.9 F (37.7 C) (11/21 1145) Pulse Rate:  [72-86] 76 (11/21 1145) Resp:  [19-25] 20 (11/21 1145) BP: (125-181)/(60-89) 156/89 (11/21 1145) SpO2:  [97 %-100 %] 100 % (11/21 1145) FiO2 (%):  [28 %] 28 % (11/21 1100)  General -  Obese middle-aged African-American lady, on trach collar, not in distress. She has abdominal surgical wound and drainage from the abdominal wall, dressing clean  Ophthalmologic - fundi not visualized due to noncooperation.  Cardiovascular - regular rate and rhythm  Neuro - on trach collar, eyes open, awake, alert. No ptosis or EOMI, denies diplopia.  PERRL. Bilateral facial weakness, tongue protrusion weak. Able to wiggle left hand fingers and left foot toes. 2-/5 LLE and 2/5 LUE. Follows commands and good eye contact.  Speech is moderate dysarthric with speaking valve.   Dense right hemiplegia persists.  ASSESSMENT/PLAN Ms. BAELEIGH DEVINCENT is a 57 y.o. female with history of difficult to control hypertension, insulin-dependent diabetes, obesity, hypothyroidism status post ablation presenting with chest discomfort, right-sided weakness, slurred speech and right facial droop. She did not receive IV t-PA due to late presentation. S/P stent assisted  angioplasty of distal basilar artery.  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 Worsening symptoms with extension of pontine/medullary infarcts with new small bilateral cerebellar infarcts without evidence of stent re-stenosis or occlusion  Resultant quadriplegia, on trach and peg  CT head - No acute intracranial abnormalities.  CTA H&N 08/17/2018 - Extensive atherosclerotic disease in the basilar with focal critical stenosis in the mid to distal basilar. No definite acute thrombus identified. Severe stenosis left posterior cerebral artery  MRI head - Acute nonhemorrhagic infarct involving the left paramedian brainstem.   CTA H&N 08/15/2018 - stent too dense to see BA lumen, but distal flow preserved. New small cerebellar infarcts since brain MRI yesterday.  Severe left P2 segment stenosis, also seen previously.   Repeat MRI - extension of b/l pontine and upper medullary infarcts with new b/l cerebellar infarcts.  2D Echo - EF 60-65%  LDL - 174  HgbA1c - 11.1  VTE prophylaxis - heparin subq  aspirin 81 mg daily prior to admission, now resumed on Brilinta 90 mg bid and ASA 81 mg daily.   Patient counseled to be compliant with her antithrombotic medications  Ongoing aggressive stroke risk factor management  Therapy recommendations:  LTACH  Disposition:  SNF  Placement - palliative care service re-visiting goal of care.   Abnormal uterine bleeding, improved  Has been following with GYN  Was on megace in the past  CT abd/pelvis 09/03/18 - enlarged fibroid uterus  continue megace 80mg  bid  Continue ASA and brilinta for now  OBGYN recs appreciated  Gave prmarin 25mg  IV as recommended - vaginal bleeding much improved  Close CBC monitoring  Anemia   Hb 6.8->PRBC->7.1->9.0-7.7->7.0->PRBC->8.6->8.5->8.3  Likely due to iron deficiency, heavy periods and acute uterine bleeding and recent surgeries  Iron panel showed iron  deficiency  On iron solutions  PRBC transfusion 1 U 09/21/2018 and 2U 09/07/18 and 2U 09/21/18  CBC monitoring  Intracranial stenosis  BA mid to distal severe stenosis s/p BA stenting  Left PCA severe stenosis, R PCA moderate stenosis  Left MCA moderate stenosis  Uncontrolled stroke risk factors with HLD, HTN, DM  Non compliance with meds at home  Respiratory failure  S/p trach 08/30/2018  On trach collar now  Able to speak with speaking valve  although dysarthric  Working with speech therapist for speaking valve  Hypertension  Stable   BP goal 130-150   On Coreg 25 bid, amlodipine and hydralazine  Close monitoring  Hyperlipidemia  Lipid lowering medication PTA:  Lipitor 20 mg daily  LDL 174, goal < 70  Increased to 80 mg daily  Continue statin at discharge  Diabetes  HgbA1c 11.4, goal < 7.0  Uncontrolled at home  Glucose stable on insulin   Decrease Lantus again today to 18U bid   Decrease basal NovoLog again today to 4U Q4h  SSI  CBG monitoring  Low grade fever with leukocytosis, resolved  Tmax 101.6 -> afebrile  Leukocytosis - 14.0->12.1->9.9->11.2->11.5->11.3->9.5->9.9->9.1  UA WBC 21-50  CXR unremarkable, improving from prior  Blood cultures negative 10/26   Respiratory cultures - mixed flora   Abdominal abscess culture showed proteus mirabilis  Off antibiotics  Hypernatremia -> hyponatremia-> resolved  Na 149->148->150->147->144->143->134->133->137->140->138  On TF @ 45  on NS @ 40  Continue BMP monitoring  Dysphagia s/p PEG complicated by abdominal wall abscess  Continue tube feeding @ 45 cc/h  PEG done 08/31/2018  Removed and replaced dislodged PEG 08/20/2018  CT showed Abdominal wall abscess - s/p open surgical drainage 08/07/2018 persistent foul odor discharge. Reexploration 09/24/2018  Culture showed peroteus mirabilis  Off meropenum  Trauma surgery following, appreciate recs  Wound debrided appropriately and  sutures removed.  No evisceration  Diarrhea  Likely related to TF  Dietitian Anderson Malta will look into formula change  D/c laxatives  Imodium PRN  If not resolving, will check C. diff  Other Stroke Risk Factors  Obesity, Body mass index is 46.55 kg/m., recommend weight loss, diet and exercise as appropriate   Other Active Problems  Hypokalemia - resolved  Thrombocytosis - likely due to anemia, resolved  Hospital day # 42  Andrea Hawking, MD PhD Stroke Neurology 09/25/2018 12:42 PM   To contact Stroke Continuity provider, please refer to [email or web address] After hours, contact General Neurology
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
## 2018-09-26 DIAGNOSIS — K591 Functional diarrhea: Secondary | ICD-10-CM

## 2018-09-26 DIAGNOSIS — Z7189 Other specified counseling: Secondary | ICD-10-CM

## 2018-09-26 LAB — CBC
HCT: 29.1 % — ABNORMAL LOW (ref 36.0–46.0)
Hemoglobin: 8.7 g/dL — ABNORMAL LOW (ref 12.0–15.0)
MCH: 26.5 pg (ref 26.0–34.0)
MCHC: 29.9 g/dL — ABNORMAL LOW (ref 30.0–36.0)
MCV: 88.7 fL (ref 80.0–100.0)
PLATELETS: 365 10*3/uL (ref 150–400)
RBC: 3.28 MIL/uL — ABNORMAL LOW (ref 3.87–5.11)
RDW: 18.4 % — ABNORMAL HIGH (ref 11.5–15.5)
WBC: 8.5 10*3/uL (ref 4.0–10.5)
nRBC: 0 % (ref 0.0–0.2)

## 2018-09-26 LAB — GLUCOSE, CAPILLARY
GLUCOSE-CAPILLARY: 106 mg/dL — AB (ref 70–99)
GLUCOSE-CAPILLARY: 130 mg/dL — AB (ref 70–99)
GLUCOSE-CAPILLARY: 132 mg/dL — AB (ref 70–99)
Glucose-Capillary: 119 mg/dL — ABNORMAL HIGH (ref 70–99)
Glucose-Capillary: 130 mg/dL — ABNORMAL HIGH (ref 70–99)
Glucose-Capillary: 152 mg/dL — ABNORMAL HIGH (ref 70–99)
Glucose-Capillary: 153 mg/dL — ABNORMAL HIGH (ref 70–99)
Glucose-Capillary: 166 mg/dL — ABNORMAL HIGH (ref 70–99)

## 2018-09-26 LAB — BASIC METABOLIC PANEL
ANION GAP: 6 (ref 5–15)
BUN: 24 mg/dL — ABNORMAL HIGH (ref 6–20)
CHLORIDE: 114 mmol/L — AB (ref 98–111)
CO2: 22 mmol/L (ref 22–32)
Calcium: 10.5 mg/dL — ABNORMAL HIGH (ref 8.9–10.3)
Creatinine, Ser: 0.69 mg/dL (ref 0.44–1.00)
GFR calc Af Amer: >60 mL/min (ref >60)
GFR calc non Af Amer: >60 mL/min (ref >60)
Glucose, Bld: 141 mg/dL — ABNORMAL HIGH (ref 70–99)
POTASSIUM: 4.3 mmol/L (ref 3.5–5.1)
Sodium: 142 mmol/L (ref 135–145)

## 2018-09-26 MED ORDER — LOPERAMIDE HCL 2 MG PO CAPS
4.0000 mg | ORAL_CAPSULE | Freq: Three times a day (TID) | ORAL | Status: DC
  Administered 2018-09-26 – 2018-09-27 (×3): 4 mg via ORAL
  Filled 2018-09-26 (×3): qty 2

## 2018-09-26 MED ORDER — LOPERAMIDE HCL 1 MG/7.5ML PO SUSP
4.0000 mg | ORAL | Status: AC
  Administered 2018-09-26: 4 mg
  Filled 2018-09-26: qty 30

## 2018-09-26 MED ORDER — BARRIER CREAM NON-SPECIFIED
1.0000 "application " | TOPICAL_CREAM | Freq: Two times a day (BID) | TOPICAL | Status: DC | PRN
  Administered 2018-10-20: 1 via TOPICAL
  Filled 2018-09-26: qty 1

## 2018-09-26 MED ORDER — LOPERAMIDE HCL 2 MG PO CAPS: 4.0000 mg | ORAL_CAPSULE | Freq: Three times a day (TID) | ORAL | Status: DC

## 2018-09-26 MED ORDER — SODIUM CHLORIDE 3 % IN NEBU
4.0000 mL | INHALATION_SOLUTION | Freq: Two times a day (BID) | RESPIRATORY_TRACT | Status: DC
  Administered 2018-09-26 – 2018-10-10 (×27): 4 mL via RESPIRATORY_TRACT
  Filled 2018-09-26 (×28): qty 4

## 2018-09-26 MED ORDER — IPRATROPIUM-ALBUTEROL 0.5-2.5 (3) MG/3ML IN SOLN
3.0000 mL | Freq: Two times a day (BID) | RESPIRATORY_TRACT | Status: DC
  Administered 2018-09-26 – 2018-10-01 (×10): 3 mL via RESPIRATORY_TRACT
  Filled 2018-09-26 (×10): qty 3

## 2018-09-26 NOTE — Progress Notes (Addendum)
Palliative note:   Met with patient, patient's sister Andrea [name] and Roetta Sessions for continued disposition and GOC discussion. Patient's stated GOC are to continued full aggressive care, including PT/OT in the hopes to regain as much function as possible and return to living in a home setting.  Discussion was had regarding support by family members and housing after rehab is completed.  Andrea [name] expressed concern that her home is not adequate setting for patient, and patient's current home has "bugs".  After prolonged discussion Andrea [name] stated there were multiple family members who could rotate providing care and support if a proper home was found for patient.  Andrea [name] is going to start looking for a new home that is cleaner for patient to return to after rehab. There are family members who are healthcare providers, and she states they will help care for patient after rehab. We also completed HCPOA paperwork in which patient designated Andrea [name] as her HCPOA. She declined to complete a living will.  Symptomatically patient states she felt less anxious last night with lorazepam and Andrea [name] also noted a decrease in her anxiety. Zyona would like to continue nightly low dose ativan.  She is still having loose stool. Dietitian changed TF from Glucerna to Vital AF. Immodium prn has been ordered but not given. Will order one dose to be given now. Patient is noted to participate well in therapy, even though it is difficult for her. She is motivated and has full desire. My opinion is that with this goal she needs support in an inpatient aggressive therapy environment- possibly continuous inpatient rehab- to give her every opportunity to enable her to reach her stated [place] of returning home.  Mariana Kaufman, AGNP-C Palliative Medicine  Please call Palliative Medicine team phone with any questions [phone]. For individual providers please see AMION.  Time in: 1000 Time out: 1115 Total time: 75 minutes Prolonged  services billed: yes   Greater than 50%  of this time was spent counseling and coordinating care related to the above assessment and plan

## 2018-09-26 NOTE — Social Work (Signed)
CSW met with pt and pt sister Andrea [name], as well as Andrea Downing, NP with PMT.  Introduced self, role, and reason for visit. While CSW present pt stated that she wants to go home but is understanding and open to going to a rehab before going home. Pt sister Andrea [name] states understanding of pt's wishes. Andrea [name] says that pt has multiple other siblings but that they have not voiced that they would be active members of the care team. Andrea [name] states that she has her own health needs and is looking at going back to work- but is open to bringing [name] home if the family is able to coordinate care- she says she will try and speak with her family as best she can about arranging care for pt.  CSW explained SNF search and went over the logistics of a Letter of Guarantee again (LOG) and the challenges that comes with finding placement for pts without payor source. Acknowledged that pt's Medicaid is pending with [facility]. Discussed that there are limited facilities that have said they can manage all of pts care as well as accept the Letter of Guarantee.  Andrea [name] and Andrea [name] both aware that [name] is still reviewing pt clinical information- but that all other facilities that CSW has contacted have not offered a bed. Andrea [name] states that if Andrea [name] has to go to [facility] that they will be up there daily- but did express that she would be unhappy if pt discharged there and "something happened to her". Andrea [name] was able to verbalize that they have family and friends with experiences at [name] that were not positive and she stated multiple times "I'm not going there."  This Probation officer affirmed to Andrea [name] know that I heard her when she says she does not want to go to [name], however CSW again reviewed the limited choices, that often accompany an LOG search for a pt that has such intensive care needs, again with all individuals present. CSW discussed that we will attempt to reach out to other facilities. Per Andrea [name], the PMT is also reaching out  on behalf of the pt to facilities to discuss taking pt. PMT and CSW will continue to search for a facility for pt care that will accept an LOG.   Let Andrea [name] and [name] know that CSW would update them as any additional information or care planning became available. Andrea [name] and Andrea [name] gave CSW verbal permission to speak with Andrea [name] and Andrea [name] who are pt's nieces. Left the room so that Andrea [name] and Andrea [name] could complete advanced directives with [name].  CSW will continue to follow and assist with disposition. Andrea [name], MSW, [place] [employer] [phone]

## 2018-09-26 NOTE — Progress Notes (Signed)
STROKE TEAM PROGRESS NOTE   SUBJECTIVE (INTERVAL HISTORY) No family is at bedside. Pt still has loose stools but she felt buttock pain is better. She is asking when the trach can be removed. I will contact CCM regarding the answer. She apparently had met with palliative care team and social working and she desired for full aggressive care and SW is working on the place which is a very difficult one.     OBJECTIVE Vitals:   09/26/18 0011 09/26/18 0407 09/26/18 0734 09/26/18 0749  BP:  (!) 154/87 (!) 166/93 (!) 166/93  Pulse:  80 79 79  Resp:  16 20 (!) 24  Temp: (!) 97.5 F (36.4 C) 98.3 F (36.8 C) 98.4 F (36.9 C)   TempSrc: Oral Oral Oral   SpO2: 98% 100% 97% 98%  Weight:      Height:        CBC:  Recent Labs  Lab 09/20/18 0323  09/24/18 0442 09/26/18 0507  WBC 9.5   < > 9.1 8.5  NEUTROABS 6.7  --   --   --   HGB 7.7*   < > 8.3* 8.7*  HCT 26.5*   < > 28.1* 29.1*  MCV 85.5   < > 87.8 88.7  PLT 503*   < > 371 365   < > = values in this interval not displayed.    Basic Metabolic Panel:  Recent Labs  Lab 09/20/18 0323  09/24/18 0442 09/26/18 0507  NA 133*   < > 138 142  K 4.6   < > 4.2 4.3  CL 105   < > 110 114*  CO2 22   < > 20* 22  GLUCOSE 143*   < > 122* 141*  BUN 35*   < > 27* 24*  CREATININE 0.85   < > 0.66 0.69  CALCIUM 10.1   < > 10.4* 10.5*  PHOS 3.5  --   --   --    < > = values in this interval not displayed.    Lipid Panel:     Component Value Date/Time   CHOL 256 (H) 08/15/2018 0512   TRIG 248 (H) 08/15/2018 0512   HDL 35 (L) 08/15/2018 0512   CHOLHDL 7.3 08/15/2018 0512   VLDL 50 (H) 08/15/2018 0512   LDLCALC 171 (H) 08/15/2018 0512   HgbA1c:  Lab Results  Component Value Date   HGBA1C 11.4 (H) 08/15/2018   Urine Drug Screen:     Component Value Date/Time   LABOPIA NONE DETECTED 08/09/2018 1836   COCAINSCRNUR NONE DETECTED 08/15/2018 1836   LABBENZ NONE DETECTED 08/31/2018 1836   AMPHETMU NONE DETECTED 08/24/2018 1836   THCU NONE  DETECTED 08/08/2018 1836   LABBARB NONE DETECTED 09/03/2018 1836    Alcohol Level No results found for: ETH  IMAGING  Ct Angio Head W Or Wo Contrast Ct Angio Neck W Or Wo Contrast 08/15/2018 IMPRESSION:  1. Interval basilar to left proximal PCA stenting. The density of the stent walls precludes detection of in stent stenosis; there is a degree of wasting at the mid basilar segment. There is flow in the left PCA beyond the stent such that there is presumed stent patency.  2. Known lower pontine infarct. There are new small cerebellar infarcts since brain MRI yesterday.  3. Severe left P2 segment stenosis, also seen previously.  4. Moderate atheromatous narrowing in the proximal left MCA.    Ct Angio Head W Or Wo Contrast Ct Angio Neck W  Or Wo Contrast 08/12/2018 IMPRESSION:  1. 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 No definite acute thrombus identified.  2. Severe stenosis left posterior cerebral artery and moderate stenosis right posterior cerebral artery.  3. Mild stenosis left MCA and moderate stenosis left MCA bifurcation.  4. No significant carotid or vertebral artery stenosis in the neck.    Ct Head Wo Contrast 08/15/2018 IMPRESSION:  No acute intracranial abnormalities. Mild white matter changes likely due to small vessel ischemia.    Mr Brain Wo Contrast 09/02/2018 IMPRESSION:  1. Acute nonhemorrhagic infarct involving the left paramedian brainstem. The infarct crosses midline.  2. Other periventricular and subcortical white matter disease is moderately advanced for age. This likely reflects the sequela of chronic microvascular ischemia.  3. Tapering of the dens with prominent soft tissue pannus. 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    Ct Head Code Stroke Wo Contrast 09/04/2018  IMPRESSION:  1. No acute abnormality and no change from earlier today  2. ASPECTS is 10 3.    Cerebral  Angiogram 08/11/2018 S/P 4 vessel cerebral arteriogram RT CFA approach. 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 S/P stent assisted angioplasty of distal basilar artery with patency of 80 to 90 %. 2.Approx 70 % stenosis of LT ICA supraclinoid seg   MRI 08/15/18 1. Progressive acute pontine infarct. New patchy bilateral cerebellar infarction. New tiny left thalamic infarcts. 2. Preserved flow void in the stented basilar. 3. left facial/submandibular edema, please correlate with neck exam. Edited result: IMPRESSION: 1. Progressive acute pontine infarct. New patchy bilateral cerebellar infarction. New tiny left thalamic infarcts. 2. Preserved flow void in the stented basilar.  TTE - Left ventricle: The cavity size was normal. There was severe   concentric hypertrophy. Systolic function was normal. The   estimated ejection fraction was in the range of 60% to 65%. Wall   motion was normal; there were no regional wall motion   abnormalities. Doppler parameters are consistent with abnormal   left ventricular relaxation (grade 1 diastolic dysfunction). The   E/e&' ratio is <8, suggesting normal LV filling pressure. - Aortic valve: Trileaflet; mildly calcified leaflets.   Transvalvular velocity was minimally increased. There was no   regurgitation. Mean gradient (S): 12 mm Hg. - Mitral valve: Mildly thickened leaflets . There was trivial   regurgitation. - Left atrium: The atrium was normal in size. - Inferior vena cava: The vessel was dilated. The respirophasic   diameter changes were blunted (< 50%), consistent with elevated   central venous pressure. Impressions: - LVEF 60-65%, severe LVH, normal wall motion, grade 1 DD, normal   LV filling pressure, minimally increased aortic velocity without   signficant stenosis, trivial MR, normal LA size, dilated IVC.  CT abdomen and  pelvis 09/03/18 Mild bilateral posterior basilar subsegmental atelectasis. Interval placement of new gastrostomy tube with tip and balloon within gastric lumen. Surgical drain is seen in the soft tissues in previous gastrostomy site. Small amount of gas and stranding is seen in the peritoneal fat anterior and inferior to the stomach which most likely is related to gastrostomy placement. Probable surgical wound seen in right upper quadrant of the anterior abdominal wall. 8.2 x 2.3 cm gas and fluid collection is seen in the anterior abdominal wall which is unchanged compared to prior exam. Possible abscess cannot be excluded. Enlarged fibroid  uterus.  PHYSICAL EXAM  Temp:  [97.5 F (36.4 C)-99.9 F (37.7 C)] 98.4 F (36.9 C) (11/22 0734) Pulse Rate:  [68-87] 79 (11/22 0749) Resp:  [16-24] 24 (11/22 0749) BP: (124-166)/(71-93) 166/93 (11/22 0749) SpO2:  [95 %-100 %] 98 % (11/22 0749) FiO2 (%):  [28 %] 28 % (11/22 0749)  General -  Obese middle-aged African-American lady, on trach collar, not in distress. She has abdominal surgical wound and drainage from the abdominal wall, dressing clean  Ophthalmologic - fundi not visualized due to noncooperation.  Cardiovascular - regular rate and rhythm  Neuro - on trach collar, eyes open, awake, alert. No ptosis or EOMI, denies diplopia.  PERRL. Bilateral facial weakness, tongue protrusion weak. Able to wiggle left hand fingers and left foot toes. 2-/5 LLE and 2/5 LUE. Follows commands and good eye contact.  Speech is moderate dysarthric with speaking valve.   Dense right hemiplegia persists.  ASSESSMENT/PLAN Andrea [name] is a 57 y.o. female with history of difficult to control hypertension, insulin-dependent diabetes, obesity, hypothyroidism status post ablation presenting with chest discomfort, right-sided weakness, slurred speech and right facial droop. She did not receive IV t-PA due to late presentation. S/P stent assisted angioplasty  of distal basilar artery.  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 Worsening symptoms with extension of pontine/medullary infarcts with new small bilateral cerebellar infarcts without evidence of stent re-stenosis or occlusion  Resultant quadriplegia, on trach and peg  CT head - No acute intracranial abnormalities.  CTA H&N 09/02/2018 - Extensive atherosclerotic disease in the basilar with focal critical stenosis in the mid to distal basilar. No definite acute thrombus identified. Severe stenosis left posterior cerebral artery  MRI head - Acute nonhemorrhagic infarct involving the left paramedian brainstem.   CTA H&N 08/15/2018 - stent too dense to see BA lumen, but distal flow preserved. New small cerebellar infarcts since brain MRI yesterday.  Severe left P2 segment stenosis, also seen previously.   Repeat MRI - extension of b/l pontine and upper medullary infarcts with new b/l cerebellar infarcts.  2D Echo - EF 60-65%  LDL - 174  HgbA1c - 11.1  VTE prophylaxis - heparin subq  aspirin 81 mg daily prior to admission, now resumed on Brilinta 90 mg bid and ASA 81 mg daily.   Patient counseled to be compliant with her antithrombotic medications  Ongoing aggressive stroke risk factor management  Therapy recommendations:  SNF  Disposition:  SNF  Placement - palliative care service has discussed with pt using speaking valve and apparently pt would like full aggressive care. SW still working on the placement which is very difficult at this time.    Abnormal uterine bleeding, improved  Has been following with GYN  Was on megace in the past  CT abd/pelvis 09/03/18 - enlarged fibroid uterus  continue megace 88m bid  Continue ASA and brilinta for now  OBGYN recs appreciated  Gave prmarin 215mIV as recommended - vaginal bleeding much improved  Close CBC monitoring  Anemia   Hb  6.8->PRBC->7.1->9.0-7.7->7.0->PRBC->8.6->8.5->8.3->8.7  Likely due to iron deficiency, heavy periods and acute uterine bleeding and recent surgeries  Iron panel showed iron deficiency  On iron solutions  PRBC transfusion 1 U 09/10/2018 and 2U 09/07/18 and 2U 09/21/18  CBC monitoring  Intracranial stenosis  BA mid to distal severe stenosis s/p BA stenting  Left PCA severe stenosis, R PCA moderate stenosis  Left MCA moderate stenosis  Uncontrolled stroke risk factors  with HLD, HTN, DM  Non compliance with meds at home  Respiratory failure  S/p trach 08/31/2018  On trach collar now  Able to speak with speaking valve although dysarthric  Working with speech therapist for speaking valve  Hypertension  Stable   BP goal 130-150   On Coreg 25 bid, amlodipine and hydralazine  Close monitoring  Hyperlipidemia  Lipid lowering medication PTA:  Lipitor 20 mg daily  LDL 174, goal < 70  Increased to 80 mg daily  Continue statin at discharge  Diabetes  HgbA1c 11.4, goal < 7.0  Uncontrolled at home  Glucose much improved on insulin   Decrease Lantus again today to 15U bid   Decrease basal NovoLog again today to 3U Q4h  SSI  CBG monitoring  Low grade fever with leukocytosis, resolved  Tmax 101.6 -> afebrile  Leukocytosis - 14.0->12.1->9.9->11.2->11.5->11.3->9.5->9.9->9.1->8.5  UA WBC 21-50  CXR unremarkable, improving from prior  Blood cultures negative 10/26   Respiratory cultures - mixed flora   Abdominal abscess culture showed proteus mirabilis  Off antibiotics  Hypernatremia -> hyponatremia-> resolved  Na 149->148->150->147->144->143->134->133->137->140->138->142  On TF @ 45  on NS @ 40  Continue BMP monitoring  Dysphagia s/p PEG complicated by abdominal wall abscess  Continue tube feeding @ 45 cc/h  PEG done 08/12/2018  Removed and replaced dislodged PEG 08/21/2018  CT showed Abdominal wall abscess - s/p open surgical drainage  08/08/2018 persistent foul odor discharge. Reexploration 09/20/2018  Culture showed peroteus mirabilis  Off meropenum  Trauma surgery following, appreciate recs  Wound debrided appropriately and sutures removed.  No evisceration  Diarrhea  Likely related to TF  Dietitian has made formula change  D/c laxatives  Imodium Q8h with one dose now  check C. Diff, but low suspicious  Other Stroke Risk Factors  Obesity, Body mass index is 46.55 kg/m., recommend weight loss, diet and exercise as appropriate   Other Active Problems  Hypokalemia - resolved  Thrombocytosis - likely due to anemia, resolved  Will contact CCM regarding trach removal process  Hospital day # 59  Rosalin Hawking, MD PhD Stroke Neurology 09/26/2018 12:53 PM    To contact Stroke Continuity provider, please refer to [email or web address] After hours, contact General Neurology

## 2018-09-26 NOTE — Progress Notes (Signed)
Patient was c/o "I cant breathe" RRT at bedside assessing airway and patient clinical status. Patient IC was removed and noted a large mucous plug inside the lumen of the inner cannula. IC was cleansed with NS, dried, and place back into trach flanged with complications. Patient was suctioned and got back moderate amount of tan thick secretions. RN at bedside. Pt is hemodynamically no further complications noted. No respiratory compromise noted

## 2018-09-26 NOTE — Progress Notes (Signed)
   09/26/18 1500  Clinical Encounter Type  Visited With Patient and family together  Visit Type Initial  Advance Directives (For Healthcare)  Does Patient Have a Medical Advance Directive? Yes  Chaplain received spiritual consult regarding Advanced Directive.  Pt witnessed chaplain signing form, as chaplain was told that it was legal to do so from notary. This was done because pt was unable to do so. Witnesses were present with the AD and chaplain at the signing.  AD was placed in pt's file. Will continue to follow as needed. Tamsen Snider Pager [phone]

## 2018-09-27 LAB — GLUCOSE, CAPILLARY
GLUCOSE-CAPILLARY: 124 mg/dL — AB (ref 70–99)
GLUCOSE-CAPILLARY: 129 mg/dL — AB (ref 70–99)
GLUCOSE-CAPILLARY: 131 mg/dL — AB (ref 70–99)
Glucose-Capillary: 126 mg/dL — ABNORMAL HIGH (ref 70–99)
Glucose-Capillary: 142 mg/dL — ABNORMAL HIGH (ref 70–99)
Glucose-Capillary: 131 mg/dL — ABNORMAL HIGH (ref 70–99)

## 2018-09-27 NOTE — Progress Notes (Signed)
STROKE TEAM PROGRESS NOTE   SUBJECTIVE (INTERVAL HISTORY) No family is at bedside. Pt diarrhea much improved, no more diarrhea after change formula and imodium. However, she still has copious secretions and trach was not able to decannulized yet.     OBJECTIVE Vitals:   09/26/18 2300 09/27/18 0500 09/27/18 0742 09/27/18 0745  BP: (!) 162/75 135/87 (!) 146/75   Pulse: 83 88 85   Resp: (!) 26 17 (!) 25   Temp: 99.3 F (37.4 C) 99.2 F (37.3 C) 97.6 F (36.4 C)   TempSrc: Axillary Axillary Oral   SpO2: 96% 100% 96% 97%  Weight:      Height:        CBC:  Recent Labs  Lab 09/24/18 0442 09/26/18 0507  WBC 9.1 8.5  HGB 8.3* 8.7*  HCT 28.1* 29.1*  MCV 87.8 88.7  PLT 371 841    Basic Metabolic Panel:  Recent Labs  Lab 09/24/18 0442 09/26/18 0507  NA 138 142  K 4.2 4.3  CL 110 114*  CO2 20* 22  GLUCOSE 122* 141*  BUN 27* 24*  CREATININE 0.66 0.69  CALCIUM 10.4* 10.5*    Lipid Panel:     Component Value Date/Time   CHOL 256 (H) 08/15/2018 0512   TRIG 248 (H) 08/15/2018 0512   HDL 35 (L) 08/15/2018 0512   CHOLHDL 7.3 08/15/2018 0512   VLDL 50 (H) 08/15/2018 0512   LDLCALC 171 (H) 08/15/2018 0512   HgbA1c:  Lab Results  Component Value Date   HGBA1C 11.4 (H) 08/15/2018   Urine Drug Screen:     Component Value Date/Time   LABOPIA NONE DETECTED 08/07/2018 1836   COCAINSCRNUR NONE DETECTED 08/15/2018 1836   LABBENZ NONE DETECTED 08/16/2018 1836   AMPHETMU NONE DETECTED 09/01/2018 1836   THCU NONE DETECTED 08/10/2018 1836   LABBARB NONE DETECTED 08/06/2018 1836    Alcohol Level No results found for: ETH  IMAGING  Ct Angio Head W Or Wo Contrast Ct Angio Neck W Or Wo Contrast 08/15/2018 IMPRESSION:  1. Interval basilar to left proximal PCA stenting. The density of the stent walls precludes detection of in stent stenosis; there is a degree of wasting at the mid basilar segment. There is flow in the left PCA beyond the stent such that there is presumed  stent patency.  2. Known lower pontine infarct. There are new small cerebellar infarcts since brain MRI yesterday.  3. Severe left P2 segment stenosis, also seen previously.  4. Moderate atheromatous narrowing in the proximal left MCA.    Ct Angio Head W Or Wo Contrast Ct Angio Neck W Or Wo Contrast 09/01/2018 IMPRESSION:  1. 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 No definite acute thrombus identified.  2. Severe stenosis left posterior cerebral artery and moderate stenosis right posterior cerebral artery.  3. Mild stenosis left MCA and moderate stenosis left MCA bifurcation.  4. No significant carotid or vertebral artery stenosis in the neck.    Ct Head Wo Contrast 08/07/2018 IMPRESSION:  No acute intracranial abnormalities. Mild white matter changes likely due to small vessel ischemia.    Mr Brain Wo Contrast 08/08/2018 IMPRESSION:  1. Acute nonhemorrhagic infarct involving the left paramedian brainstem. The infarct crosses midline.  2. Other periventricular and subcortical white matter disease is moderately advanced for age. This likely reflects the sequela of chronic microvascular ischemia.  3. Tapering of the dens with prominent soft tissue pannus. 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    Ct Head Code Stroke Wo Contrast 08/30/2018  IMPRESSION:  1. No acute abnormality and no change from earlier today  2. ASPECTS is 10 3.    Cerebral Angiogram 08/27/2018 S/P 4 vessel cerebral arteriogram RT CFA approach. 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 S/P stent assisted angioplasty of distal basilar artery with patency of 80 to 90 %. 2.Approx 70 % stenosis of LT ICA supraclinoid seg   MRI 08/15/18 1. Progressive acute pontine infarct. New patchy bilateral cerebellar infarction. New tiny left  thalamic infarcts. 2. Preserved flow void in the stented basilar. 3. left facial/submandibular edema, please correlate with neck exam. Edited result: IMPRESSION: 1. Progressive acute pontine infarct. New patchy bilateral cerebellar infarction. New tiny left thalamic infarcts. 2. Preserved flow void in the stented basilar.  TTE - Left ventricle: The cavity size was normal. There was severe   concentric hypertrophy. Systolic function was normal. The   estimated ejection fraction was in the range of 60% to 65%. Wall   motion was normal; there were no regional wall motion   abnormalities. Doppler parameters are consistent with abnormal   left ventricular relaxation (grade 1 diastolic dysfunction). The   E/e&' ratio is <8, suggesting normal LV filling pressure. - Aortic valve: Trileaflet; mildly calcified leaflets.   Transvalvular velocity was minimally increased. There was no   regurgitation. Mean gradient (S): 12 mm Hg. - Mitral valve: Mildly thickened leaflets . There was trivial   regurgitation. - Left atrium: The atrium was normal in size. - Inferior vena cava: The vessel was dilated. The respirophasic   diameter changes were blunted (< 50%), consistent with elevated   central venous pressure. Impressions: - LVEF 60-65%, severe LVH, normal wall motion, grade 1 DD, normal   LV filling pressure, minimally increased aortic velocity without   signficant stenosis, trivial MR, normal LA size, dilated IVC.  CT abdomen and pelvis 09/03/18 Mild bilateral posterior basilar subsegmental atelectasis. Interval placement of new gastrostomy tube with tip and balloon within gastric lumen. Surgical drain is seen in the soft tissues in previous gastrostomy site. Small amount of gas and stranding is seen in the peritoneal fat anterior and inferior to the stomach which most likely is related to gastrostomy placement. Probable surgical wound seen in right upper quadrant of the anterior abdominal  wall. 8.2 x 2.3 cm gas and fluid collection is seen in the anterior abdominal wall which is unchanged compared to prior exam. Possible abscess cannot be excluded. Enlarged fibroid uterus.  PHYSICAL EXAM  Temp:  [97.6 F (36.4 C)-99.3 F (37.4 C)] 97.6 F (36.4 C) (11/23 0742) Pulse Rate:  [77-88] 85 (11/23 0742) Resp:  [17-30] 25 (11/23 0742) BP: (135-163)/(75-87) 146/75 (11/23 0742) SpO2:  [96 %-100 %] 97 % (11/23 0745) FiO2 (%):  [28 %] 28 % (11/23 0745)  General -  Obese middle-aged African-American lady, on trach collar, not in distress. She has abdominal surgical wound and drainage from the abdominal wall, dressing clean  Ophthalmologic - fundi not visualized due to noncooperation.  Cardiovascular - regular rate and rhythm  Neuro - on trach collar, eyes open, awake, alert. No ptosis or EOMI, denies diplopia.  PERRL. Bilateral facial weakness, tongue protrusion weak. Able to wiggle left hand fingers and left foot toes. 2-/5 LLE and 2/5 LUE. Follows commands and good eye contact.  Speech is moderate dysarthric with speaking  valve.   Dense right hemiplegia persists.  ASSESSMENT/PLAN [name] is a 57 y.o. female with history of difficult to control hypertension, insulin-dependent diabetes, obesity, hypothyroidism status post ablation presenting with chest discomfort, right-sided weakness, slurred speech and right facial droop. She did not receive IV t-PA due to late presentation. S/P stent assisted angioplasty of distal basilar artery.  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 Worsening symptoms with extension of pontine/medullary infarcts with new small bilateral cerebellar infarcts without evidence of stent re-stenosis or occlusion  Resultant quadriplegia, on trach and peg  CT head - No acute intracranial abnormalities.  CTA H&N 08/18/2018 - Extensive atherosclerotic disease in the basilar with focal critical stenosis in  the mid to distal basilar. No definite acute thrombus identified. Severe stenosis left posterior cerebral artery  MRI head - Acute nonhemorrhagic infarct involving the left paramedian brainstem.   CTA H&N 08/15/2018 - stent too dense to see BA lumen, but distal flow preserved. New small cerebellar infarcts since brain MRI yesterday.  Severe left P2 segment stenosis, also seen previously.   Repeat MRI - extension of b/l pontine and upper medullary infarcts with new b/l cerebellar infarcts.  2D Echo - EF 60-65%  LDL - 174  HgbA1c - 11.1  VTE prophylaxis - heparin subq  aspirin 81 mg daily prior to admission, now resumed on Brilinta 90 mg bid and ASA 81 mg daily.   Patient counseled to be compliant with her antithrombotic medications  Ongoing aggressive stroke risk factor management  Therapy recommendations:  SNF  Disposition:  SNF  Placement - palliative care service has discussed with pt using speaking valve and apparently pt would like full aggressive care. SW still working on the placement which is very difficult at this time.    Abnormal uterine bleeding, improved  Has been following with GYN  Was on megace in the past  CT abd/pelvis 09/03/18 - enlarged fibroid uterus  continue megace 80mg  bid  Continue ASA and brilinta for now  OBGYN recs appreciated  Gave prmarin 25mg  IV as recommended - vaginal bleeding much improved  Close CBC monitoring  Anemia   Hb 6.8->PRBC->7.1->9.0-7.7->7.0->PRBC->8.6->8.5->8.3->8.7  Likely due to iron deficiency, heavy periods and acute uterine bleeding and recent surgeries  Iron panel showed iron deficiency  On iron solutions  PRBC transfusion 1 U 09/10/2018 and 2U 09/07/18 and 2U 09/21/18  CBC monitoring  Intracranial stenosis  BA mid to distal severe stenosis s/p BA stenting  Left PCA severe stenosis, R PCA moderate stenosis  Left MCA moderate stenosis  Uncontrolled stroke risk factors with HLD, HTN, DM  Non  compliance with meds at home  Respiratory failure  S/p trach 08/22/2018  On trach collar now  Able to speak with speaking valve although dysarthric  Working with speech therapist for speaking valve  Hypertension  Stable so far  BP goal 130-150   On Coreg 25 bid, amlodipine and hydralazine  Close monitoring  Hyperlipidemia  Lipid lowering medication PTA:  Lipitor 20 mg daily  LDL 174, goal < 70  Increased to 80 mg daily  Continue statin at discharge  Diabetes  HgbA1c 11.4, goal < 7.0  Uncontrolled at home  Glucose much improved on insulin adjustment  Decrease Lantus again today to 15U bid   Decrease basal NovoLog again today to 3U Q4h  SSI  CBG monitoring  Low grade fever with leukocytosis, resolved  Tmax 101.6 -> afebrile  Leukocytosis - 14.0->12.1->9.9->11.2->11.5->11.3->9.5->9.9->9.1->8.5  UA  WBC 21-50  CXR unremarkable, improving from prior  Blood cultures negative 10/26   Respiratory cultures - mixed flora   Abdominal abscess culture showed proteus mirabilis  Off antibiotics  Hypernatremia -> hyponatremia-> resolved  Na 149->148->150->147->144->143->134->133->137->140->138->142  On TF @ 65  on NS @ 20  Continue BMP monitoring  Dysphagia s/p PEG complicated by abdominal wall abscess  Continue tube feeding @ 45 cc/h  PEG done 08/18/2018  Removed and replaced dislodged PEG 08/11/2018  CT showed Abdominal wall abscess - s/p open surgical drainage 08/05/2018 persistent foul odor discharge. Reexploration 09/20/2018  Culture showed peroteus mirabilis  Off meropenum  Trauma surgery following, appreciate recs  Wound debrided appropriately and sutures removed.  No evisceration  Diarrhea, improved  Likely related to TF  Dietitian has made formula change  D/c laxatives  Imodium Q8h   Diarrhea much improved now  C. Diff PCR pending  Other Stroke Risk Factors  Obesity, Body mass index is 46.55 kg/m., recommend weight loss, diet  and exercise as appropriate   Other Active Problems  Hypokalemia - resolved  Thrombocytosis - likely due to anemia, resolved  Hospital day # 67  Rosalin Hawking, MD PhD Stroke Neurology 09/27/2018 11:16 AM   To contact Stroke Continuity provider, please refer to [email or web address] After hours, contact General Neurology

## 2018-09-28 LAB — GLUCOSE, CAPILLARY
GLUCOSE-CAPILLARY: 109 mg/dL — AB (ref 70–99)
GLUCOSE-CAPILLARY: 132 mg/dL — AB (ref 70–99)
GLUCOSE-CAPILLARY: 124 mg/dL — AB (ref 70–99)
Glucose-Capillary: 135 mg/dL — ABNORMAL HIGH (ref 70–99)
Glucose-Capillary: 118 mg/dL — ABNORMAL HIGH (ref 70–99)
Glucose-Capillary: 116 mg/dL — ABNORMAL HIGH (ref 70–99)

## 2018-09-28 MED ORDER — LOPERAMIDE HCL 2 MG PO CAPS: 4.0000 mg | ORAL_CAPSULE | ORAL | Status: DC | PRN

## 2018-09-28 MED ORDER — VITAL AF 1.2 CAL PO LIQD
1500.0000 mL | ORAL | Status: DC
  Administered 2018-09-29 – 2018-10-14 (×15): 1500 mL
  Filled 2018-09-28 (×17): qty 1500

## 2018-09-28 NOTE — Progress Notes (Signed)
RT assessed inner cannula, needs cleaned out, but Pt refusing tracheal suction, and trach care at this time. RN aware. RT Will continue to monitor.

## 2018-09-28 NOTE — Progress Notes (Signed)
STROKE TEAM PROGRESS NOTE   SUBJECTIVE (INTERVAL HISTORY) No family is at bedside. Pt diarrhea much improved, no more diarrhea after change formula and imodium, now has soft stool. Pt trach has no secretion yesterday whole day but last night has respiratory distress, found to have trach clogged with mucous and blood, trach was changed and this am pt no distress with minimal secretion.    OBJECTIVE Vitals:   09/27/18 2300 09/28/18 0047 09/28/18 0300 09/28/18 0515  BP: 135/76  130/70   Pulse: 80 82 84 80  Resp: (!) 28 (!) 32 (!) 28 (!) 21  Temp: 99.1 F (37.3 C)  99.2 F (37.3 C)   TempSrc: Oral  Oral   SpO2: 95% 98% 97% 100%  Weight:      Height:        CBC:  Recent Labs  Lab 09/24/18 0442 09/26/18 0507  WBC 9.1 8.5  HGB 8.3* 8.7*  HCT 28.1* 29.1*  MCV 87.8 88.7  PLT 371 322    Basic Metabolic Panel:  Recent Labs  Lab 09/24/18 0442 09/26/18 0507  NA 138 142  K 4.2 4.3  CL 110 114*  CO2 20* 22  GLUCOSE 122* 141*  BUN 27* 24*  CREATININE 0.66 0.69  CALCIUM 10.4* 10.5*    Lipid Panel:     Component Value Date/Time   CHOL 256 (H) 08/15/2018 0512   TRIG 248 (H) 08/15/2018 0512   HDL 35 (L) 08/15/2018 0512   CHOLHDL 7.3 08/15/2018 0512   VLDL 50 (H) 08/15/2018 0512   LDLCALC 171 (H) 08/15/2018 0512   HgbA1c:  Lab Results  Component Value Date   HGBA1C 11.4 (H) 08/15/2018   Urine Drug Screen:     Component Value Date/Time   LABOPIA NONE DETECTED 08/27/2018 1836   COCAINSCRNUR NONE DETECTED 09/01/2018 1836   LABBENZ NONE DETECTED 09/01/2018 1836   AMPHETMU NONE DETECTED 08/18/2018 1836   THCU NONE DETECTED 08/21/2018 1836   LABBARB NONE DETECTED 08/28/2018 1836    Alcohol Level No results found for: ETH  IMAGING  Ct Angio Head W Or Wo Contrast Ct Angio Neck W Or Wo Contrast 08/15/2018 IMPRESSION:  1. Interval basilar to left proximal PCA stenting. The density of the stent walls precludes detection of in stent stenosis; there is a degree of  wasting at the mid basilar segment. There is flow in the left PCA beyond the stent such that there is presumed stent patency.  2. Known lower pontine infarct. There are new small cerebellar infarcts since brain MRI yesterday.  3. Severe left P2 segment stenosis, also seen previously.  4. Moderate atheromatous narrowing in the proximal left MCA.    Ct Angio Head W Or Wo Contrast Ct Angio Neck W Or Wo Contrast 08/31/2018 IMPRESSION:  1. 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 No definite acute thrombus identified.  2. Severe stenosis left posterior cerebral artery and moderate stenosis right posterior cerebral artery.  3. Mild stenosis left MCA and moderate stenosis left MCA bifurcation.  4. No significant carotid or vertebral artery stenosis in the neck.    Ct Head Wo Contrast 08/22/2018 IMPRESSION:  No acute intracranial abnormalities. Mild white matter changes likely due to small vessel ischemia.    Mr Brain Wo Contrast 08/15/2018 IMPRESSION:  1. Acute nonhemorrhagic infarct involving the left paramedian brainstem. The infarct crosses midline.  2. Other periventricular and subcortical white matter disease is moderately advanced for age. This likely  reflects the sequela of chronic microvascular ischemia.  3. Tapering of the dens with prominent soft tissue pannus. 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    Ct Head Code Stroke Wo Contrast 09/03/2018  IMPRESSION:  1. No acute abnormality and no change from earlier today  2. ASPECTS is 10 3.    Cerebral Angiogram 08/27/2018 S/P 4 vessel cerebral arteriogram RT CFA approach. 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 S/P stent assisted angioplasty of distal basilar artery with patency of 80 to 90 %. 2.Approx 70 % stenosis of LT ICA supraclinoid  seg   MRI 08/15/18 1. Progressive acute pontine infarct. New patchy bilateral cerebellar infarction. New tiny left thalamic infarcts. 2. Preserved flow void in the stented basilar. 3. left facial/submandibular edema, please correlate with neck exam. Edited result: IMPRESSION: 1. Progressive acute pontine infarct. New patchy bilateral cerebellar infarction. New tiny left thalamic infarcts. 2. Preserved flow void in the stented basilar.  TTE - Left ventricle: The cavity size was normal. There was severe   concentric hypertrophy. Systolic function was normal. The   estimated ejection fraction was in the range of 60% to 65%. Wall   motion was normal; there were no regional wall motion   abnormalities. Doppler parameters are consistent with abnormal   left ventricular relaxation (grade 1 diastolic dysfunction). The   E/e&' ratio is <8, suggesting normal LV filling pressure. - Aortic valve: Trileaflet; mildly calcified leaflets.   Transvalvular velocity was minimally increased. There was no   regurgitation. Mean gradient (S): 12 mm Hg. - Mitral valve: Mildly thickened leaflets . There was trivial   regurgitation. - Left atrium: The atrium was normal in size. - Inferior vena cava: The vessel was dilated. The respirophasic   diameter changes were blunted (< 50%), consistent with elevated   central venous pressure. Impressions: - LVEF 60-65%, severe LVH, normal wall motion, grade 1 DD, normal   LV filling pressure, minimally increased aortic velocity without   signficant stenosis, trivial MR, normal LA size, dilated IVC.  CT abdomen and pelvis 09/03/18 Mild bilateral posterior basilar subsegmental atelectasis. Interval placement of new gastrostomy tube with tip and balloon within gastric lumen. Surgical drain is seen in the soft tissues in previous gastrostomy site. Small amount of gas and stranding is seen in the peritoneal fat anterior and inferior to the stomach which most likely  is related to gastrostomy placement. Probable surgical wound seen in right upper quadrant of the anterior abdominal wall. 8.2 x 2.3 cm gas and fluid collection is seen in the anterior abdominal wall which is unchanged compared to prior exam. Possible abscess cannot be excluded. Enlarged fibroid uterus.  PHYSICAL EXAM  Temp:  [98.7 F (37.1 C)-99.2 F (37.3 C)] 99.2 F (37.3 C) (11/24 0300) Pulse Rate:  [75-86] 80 (11/24 0515) Resp:  [21-32] 21 (11/24 0515) BP: (130-161)/(70-81) 130/70 (11/24 0300) SpO2:  [91 %-100 %] 100 % (11/24 0515) FiO2 (%):  [28 %] 28 % (11/24 0515)  General -  Obese middle-aged African-American lady, on trach collar, not in distress. She has abdominal surgical wound and drainage from the abdominal wall, dressing clean  Ophthalmologic - fundi not visualized due to noncooperation.  Cardiovascular - regular rate and rhythm  Neuro - on trach collar, eyes open, awake, alert. No ptosis or EOMI, denies diplopia.  PERRL. Bilateral facial weakness, tongue protrusion weak. Able to wiggle left hand fingers and  left foot toes. 2-/5 LLE and 2/5 LUE. Follows commands and good eye contact.  Speech is moderate dysarthric with speaking valve.   Dense right hemiplegia persists.  ASSESSMENT/PLAN Andrea [name] is a 56 y.o. female with history of difficult to control hypertension, insulin-dependent diabetes, obesity, hypothyroidism status post ablation presenting with chest discomfort, right-sided weakness, slurred speech and right facial droop. She did not receive IV t-PA due to late presentation. S/P stent assisted angioplasty of distal basilar artery.  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 Worsening symptoms with extension of pontine/medullary infarcts with new small bilateral cerebellar infarcts without evidence of stent re-stenosis or occlusion  Resultant quadriplegia, on trach and peg  CT head - No acute intracranial  abnormalities.  CTA H&N 08/11/2018 - Extensive atherosclerotic disease in the basilar with focal critical stenosis in the mid to distal basilar. No definite acute thrombus identified. Severe stenosis left posterior cerebral artery  MRI head - Acute nonhemorrhagic infarct involving the left paramedian brainstem.   CTA H&N 08/15/2018 - stent too dense to see BA lumen, but distal flow preserved. New small cerebellar infarcts since brain MRI yesterday.  Severe left P2 segment stenosis, also seen previously.   Repeat MRI - extension of b/l pontine and upper medullary infarcts with new b/l cerebellar infarcts.  2D Echo - EF 60-65%  LDL - 174  HgbA1c - 11.1  VTE prophylaxis - heparin subq  aspirin 81 mg daily prior to admission, now resumed on Brilinta 90 mg bid and ASA 81 mg daily.   Patient counseled to be compliant with her antithrombotic medications  Ongoing aggressive stroke risk factor management  Therapy recommendations:  SNF  Disposition:  SNF  Placement - palliative care service has discussed with pt using speaking valve and clearly pt would like full aggressive care. SW still working on the placement which is very difficult at this time.    Abnormal uterine bleeding, improved  Has been following with GYN  Was on megace in the past  CT abd/pelvis 09/03/18 - enlarged fibroid uterus  continue megace 80mg  bid  Continue ASA and brilinta for now  OBGYN recs appreciated  Gave prmarin 25mg  IV as recommended - vaginal bleeding much improved  Close CBC monitoring  Anemia   Hb 6.8->PRBC->7.1->9.0-7.7->7.0->PRBC->8.6->8.5->8.3->8.7  Likely due to iron deficiency, heavy periods and acute uterine bleeding and recent surgeries  Iron panel showed iron deficiency  On iron solutions  PRBC transfusion 1 U 09/13/2018 and 2U 09/07/18 and 2U 09/21/18  CBC monitoring  Intracranial stenosis  BA mid to distal severe stenosis s/p BA stenting  Left PCA severe stenosis, R PCA  moderate stenosis  Left MCA moderate stenosis  Uncontrolled stroke risk factors with HLD, HTN, DM  Non compliance with meds at home  Respiratory failure  S/p trach 08/24/2018  On trach collar now  Able to speak with speaking valve although dysarthric  Trach secretion decreasing   Discussed with CCM and considering decannulation of the trach  Will follow up with CCM and RT next week   Hypertension  Stable so far  BP goal 130-150   On Coreg 25 bid, amlodipine and hydralazine  Close monitoring  Hyperlipidemia  Lipid lowering medication PTA:  Lipitor 20 mg daily  LDL 174, goal < 70  Increased to 80 mg daily  Continue statin at discharge  Diabetes  HgbA1c 11.4, goal < 7.0  Uncontrolled at home  Glucose stable after insulin adjustment  Decrease Lantus  again today to 15U bid   Decrease basal NovoLog again today to 3U Q4h  SSI  CBG monitoring  Low grade fever with leukocytosis, resolved  Tmax 101.6 -> afebrile  Leukocytosis - 14.0->12.1->9.9->11.2->11.5->11.3->9.5->9.9->9.1->8.5  UA WBC 21-50  CXR unremarkable, improving from prior  Blood cultures negative 10/26   Respiratory cultures - mixed flora   Abdominal abscess culture showed proteus mirabilis  Off antibiotics - completed course  Hypernatremia -> hyponatremia-> resolved  Na 149->148->150->147->144->143->134->133->137->140->138->142  On TF @ 65  on NS @ 20  Continue BMP monitoring  Dysphagia s/p PEG complicated by abdominal wall abscess  Continue tube feeding @ 45 cc/h  PEG done 08/07/2018  Removed and replaced dislodged PEG 08/27/2018  CT showed Abdominal wall abscess - s/p open surgical drainage 08/31/2018 persistent foul odor discharge. Reexploration 10/01/2018  Culture showed peroteus mirabilis  Off meropenum  Trauma surgery following, appreciate recs  Wound debrided appropriately and sutures removed.  No evisceration  Diarrhea, resolved  Likely related to  TF  Dietitian has made formula change  Laxatives discontinued  Imodium scheduled dose changed to PRN  Other Stroke Risk Factors  Obesity, Body mass index is 46.55 kg/m., recommend weight loss, diet and exercise as appropriate   Other Active Problems  Hypokalemia - resolved  Thrombocytosis - likely due to anemia, resolved  Hospital day # 45  Andrea Hawking, MD PhD Stroke Neurology 09/28/2018 11:43 AM   To contact Stroke Continuity provider, please refer to [email or web address] After hours, contact General Neurology

## 2018-09-29 LAB — BASIC METABOLIC PANEL
ANION GAP: 5 (ref 5–15)
BUN: 26 mg/dL — ABNORMAL HIGH (ref 6–20)
CALCIUM: 10.6 mg/dL — AB (ref 8.9–10.3)
CHLORIDE: 114 mmol/L — AB (ref 98–111)
CO2: 24 mmol/L (ref 22–32)
Creatinine, Ser: 0.74 mg/dL (ref 0.44–1.00)
GFR calc Af Amer: >60 mL/min (ref >60)
GFR calc non Af Amer: >60 mL/min (ref >60)
Glucose, Bld: 152 mg/dL — ABNORMAL HIGH (ref 70–99)
POTASSIUM: 4.1 mmol/L (ref 3.5–5.1)
SODIUM: 143 mmol/L (ref 135–145)

## 2018-09-29 LAB — CBC
HEMATOCRIT: 31 % — AB (ref 36.0–46.0)
HEMOGLOBIN: 9.2 g/dL — AB (ref 12.0–15.0)
MCH: 25.8 pg — ABNORMAL LOW (ref 26.0–34.0)
MCHC: 29.7 g/dL — ABNORMAL LOW (ref 30.0–36.0)
MCV: 87.1 fL (ref 80.0–100.0)
Platelets: 368 10*3/uL (ref 150–400)
RBC: 3.56 MIL/uL — ABNORMAL LOW (ref 3.87–5.11)
RDW: 17.9 % — ABNORMAL HIGH (ref 11.5–15.5)
WBC: 11.9 10*3/uL — AB (ref 4.0–10.5)
nRBC: 0 % (ref 0.0–0.2)

## 2018-09-29 LAB — GLUCOSE, CAPILLARY
GLUCOSE-CAPILLARY: 141 mg/dL — AB (ref 70–99)
GLUCOSE-CAPILLARY: 161 mg/dL — AB (ref 70–99)
GLUCOSE-CAPILLARY: 127 mg/dL — AB (ref 70–99)
Glucose-Capillary: 130 mg/dL — ABNORMAL HIGH (ref 70–99)
Glucose-Capillary: 155 mg/dL — ABNORMAL HIGH (ref 70–99)
Glucose-Capillary: 176 mg/dL — ABNORMAL HIGH (ref 70–99)

## 2018-09-29 NOTE — Progress Notes (Signed)
Physical Therapy Treatment Patient Details Name: Andrea [name] MRN: [id number] DOB: 07-Feb-1961 Today's Date: 09/29/2018    History of Present Illness 57 yo presented to ED with chest pain noted Rt hemiparesis in ED with acute Left paramedian brainstem infarct. Intubated 10/10 for angioplasty, extubated post procedure and reintubated. Repeat MRI with brainstem infarct extension and bil cerebellar infarcts. Peg/trach with return to vent 08/10/2018. 10/22-29 on trach collar, return to vent 10/29. 10/28 I & D of abdominal wall abscess with placement of penrose drain and G-tube, 11/1 repeat ex lap. 11/7 return to trach collar.  PMHx: HTN, DM, CKD    PT Comments    Pt tolerating transfer to chair well and sitting in tilt in space well. Pt cont to have minimal active movement in L UE and none in R UE. Pt appreciated PROM to bilat LEs. Acute PT to cont to follow.   Follow Up Recommendations  SNF;Supervision/Assistance - 24 hour     Equipment Recommendations  Hospital bed;Wheelchair (measurements PT);Other (comment)    Recommendations for Other Services       Precautions / Restrictions Precautions Precautions: Fall Precaution Comments: Peg/trach, abdominal binder  Restrictions Weight Bearing Restrictions: No    Mobility  Bed Mobility Overal bed mobility: Needs Assistance Bed Mobility: Rolling Rolling: Total assist;+2 for physical assistance         General bed mobility comments: pt rolled L/R to don lift pad beneath her, no assist fron patient  Transfers Overall transfer level: Needs assistance               General transfer comment: used maximove to transfer pt from bed to chair  Ambulation/Gait             General Gait Details: unable   Stairs             Wheelchair Mobility    Modified Rankin (Stroke Patients Only) Modified Rankin (Stroke Patients Only) Pre-Morbid Rankin Score: No symptoms Modified Rankin: Severe disability     Balance                                             Cognition Arousal/Alertness: Awake/alert Behavior During Therapy: WFL for tasks assessed/performed Overall Cognitive Status: Impaired/Different from baseline Area of Impairment: Safety/judgement;Problem solving;Memory                     Memory: Decreased short-term memory Following Commands: Follows one step commands with increased time;Follows one step commands inconsistently Safety/Judgement: Decreased awareness of safety;Decreased awareness of deficits Awareness: Emergent Problem Solving: Slow processing General Comments: pt unable to follow commands to complete L LE active ther ex      Exercises Other Exercises Other Exercises: complete passive bilat DF stretch x 5, 30 sec each Other Exercises: completed PROM to bilat hips and knees to work out stiffness due to patients inability to complete AROM    General Comments        Pertinent Vitals/Pain Pain Assessment: Faces Faces Pain Scale: Hurts little more Pain Location: grimacing with ROM of bilat LEs Pain Descriptors / Indicators: Discomfort;Grimacing Pain Intervention(s): Monitored during session    Home Living                      Prior Function            PT Goals (current goals can  now be found in the care plan section) Progress towards PT goals: Progressing toward goals    Frequency    Min 2X/week      PT Plan Current plan remains appropriate    Co-evaluation              AM-PAC PT "6 Clicks" Mobility   Outcome Measure  Help needed turning from your back to your side while in a flat bed without using bedrails?: Total Help needed moving from lying on your back to sitting on the side of a flat bed without using bedrails?: Total Help needed moving to and from a bed to a chair (including a wheelchair)?: Total Help needed standing up from a chair using your arms (e.g., wheelchair or bedside chair)?: Total Help needed to  walk in hospital room?: Total Help needed climbing 3-5 steps with a railing? : Total 6 Click Score: 6    End of Session Equipment Utilized During Treatment: Oxygen;Other (comment) Activity Tolerance: Patient tolerated treatment well Patient left: in chair;with call bell/phone within reach Nurse Communication: Mobility status;Need for lift equipment PT Visit Diagnosis: Other abnormalities of gait and mobility (R26.89);Muscle weakness (generalized) (M62.81);Other symptoms and signs involving the nervous system (R29.898);Hemiplegia and hemiparesis Hemiplegia - Right/Left: Right Hemiplegia - dominant/non-dominant: Dominant Hemiplegia - caused by: Cerebral infarction     Time: 7414-2395 PT Time Calculation (min) (ACUTE ONLY): 29 min  Charges:  $Therapeutic Exercise: 8-22 mins $Therapeutic Activity: 8-22 mins                     Kittie Plater, PT, DPT Acute Rehabilitation Services Pager #: [phone] Office #: [phone]    Berline Lopes 09/29/2018, 2:29 PM

## 2018-09-29 NOTE — Progress Notes (Signed)
Patient ID: Andrea [name], female   DOB: 1961-02-08, 57 y.o.   MRN: [id number] I&D sites clean Midline wound is cleaner and granulating  S/p PEG placement 10/21 S/p exploratory laparotomy with open gastrostomy tube and I&D 10/28 S/p takeback for repeat I&D abdominal wall + exlap 11/1 - Continue wet to dry dressingsand TF - continue abdominal binder - SNF placement pending  Georganna Skeans, MD, MPH, FACS Trauma: [phone] General Surgery: [phone]

## 2018-09-29 NOTE — Progress Notes (Signed)
  Speech Language Pathology Treatment: Dysphagia  Patient Details Name: Andrea [name] MRN: [id number] DOB: [birth year] Today's Date: 09/29/2018 Time: 0312-8118 SLP Time Calculation (min) (ACUTE ONLY): 34 min  Assessment / Plan / Recommendation Clinical Impression  Pt sitting in [facility], visiting with friend. Assisted with oral care.  Provided with therapeutic trials of purees/honey thick liquids with verbal cues for increased oral control, hard/fast swallow.  Pt continues with weak cued cough, decreased respiratory support for speech, hypophonia with PMV in place.  Instructed friend how to place/remove PMV; she was able to return demonstrate.  RMT device utilized - pt achieved three sets of five reps with tactile/verbal cues needed to achieve sufficient expiratory effort.  Pt rated effort at 7/10.  Continue towards POC.   HPI HPI: 57 yo presented to ED with chest pain noted Rt hemiparesis in ED with acute Left paramedian brainstem infarct. Intubated 10/10 for angioplasty, extubated post procedure and reintubated. Repeat MRI with brainstem infarct extension and bil cerebellar infarcts. Peg/trach 08/15/2018. FEES 09/15/18, 09/24/18. PMHx: HTN, DM, CKD      SLP Plan  Continue with current plan of care       Recommendations  Diet recommendations: NPO(allow occasional ice chips after oral care)      Patient may use Passy-Muir Speech Valve: During all therapies with supervision;Intermittently with supervision PMSV Supervision: Full         Oral Care Recommendations: Oral care QID Follow up Recommendations: Skilled Nursing facility SLP Visit Diagnosis: Dysphagia, oropharyngeal phase (R13.12) Plan: Continue with current plan of care       GO                [name] 09/29/2018, 3:33 PM

## 2018-09-29 NOTE — Progress Notes (Signed)
Occupational Therapy Treatment Patient Details Name: Andrea [name] MRN: [id number] DOB: 10-12-1961 Today's Date: 09/29/2018    History of present illness 57 yo presented to ED with chest pain noted Rt hemiparesis in ED with acute Left paramedian brainstem infarct. Intubated 10/10 for angioplasty, extubated post procedure and reintubated. Repeat MRI with brainstem infarct extension and bil cerebellar infarcts. Peg/trach with return to vent 08/15/2018. 10/22-29 on trach collar, return to vent 10/29. 10/28 I & D of abdominal wall abscess with placement of penrose drain and G-tube, 11/1 repeat ex lap. 11/7 return to trach collar.  PMHx: HTN, DM, CKD   OT comments  Session completed while pt OOB in wc with focus on neuromuscular rehabilitation, facilitating trunk control and LUE movement. Incorporated music into session, increasing spontaneous movement patterns.  Pt with overall increased strength LUE and ability to anteriorly/posteriorly shift her weight from seated position. Flaccid RUE. Friend present at end of session.  Recommend staff use Tilt bed to tilt pt 3 x/day. Recommend staff use Maximove to lift pt to wc daily. Will continue to follow acutely and recommend intensive rehab at [facility].   Follow Up Recommendations  SNF;Supervision/Assistance - 24 hour    Equipment Recommendations  Other (comment)(TBA at SNF)    Recommendations for Other Services      Precautions / Restrictions Precautions Precautions: Fall Precaution Comments: Peg/trach, abdominal binder  Restrictions Weight Bearing Restrictions: No       Mobility Bed Mobility         General bed mobility comments: OOB in wc  Transfers   Balance     Sitting balance-Leahy Scale: Zero                                     ADL either performed or assessed with clinical judgement   ADL Overall ADL's : Needs assistance/impaired   Eating/Feeding Details (indicate cue type and reason): ice chips with PMSV    Grooming Details (indicate cue type and reason): Hand over hand to wash face with max assist                                     Vision       Perception     Praxis      Cognition Arousal/Alertness: Awake/alert Behavior During Therapy: WFL for tasks assessed/performed Overall Cognitive Status: Impaired/Different from baseline Area of Impairment: Awareness;Attention;Safety/judgement                     Memory: Decreased short-term memory Following Commands: Follows one step commands consistently Safety/Judgement: Decreased awareness of safety;Decreased awareness of deficits Awareness: Emergent Problem Solving: Slow processing General Comments:        Exercises Exercises: Other exercises General Exercises - Upper Extremity Shoulder Flexion: PROM;AAROM;Both;10 reps;Seated Shoulder ABduction: PROM;AAROM;Both;10 reps;Seated Shoulder ADduction: PROM;AAROM;Both;10 reps;Seated Elbow Flexion: PROM;AAROM;AROM;Both;15 reps;Seated Elbow Extension: PROM;AROM;AAROM;Both;15 reps;Seated Wrist Flexion: Right;Left;AROM;PROM;AAROM;Strengthening;15 reps;Seated Wrist Extension: PROM;AROM;AAROM;Both;15 reps;Seated Digit Composite Flexion: PROM;AROM;AAROM;Both;15 reps;Seated Composite Extension: PROM;AROM;AAROM;Both;15 reps;Seated Other Exercises Other Exercises: weight bearing through L forearm in wc and pushing up to midline Other Exercises: anterior/posterior weight shifts from seated position in wc Other Exercises: reaching cross midline with BUE to facilitate trunk rotation in seated position Other Exercises: B shoulder elevation/drepression (AROM L; PROM R)   Shoulder Instructions       General Comments  Pertinent Vitals/ Pain       Pain Assessment: Faces Faces Pain Scale: Hurts even more Pain Location: grimacing with L shoulder ROM Pain Descriptors / Indicators: Discomfort;Grimacing Pain Intervention(s): Limited activity within patient's  tolerance  Home Living                                          Prior Functioning/Environment              Frequency  Min 2X/week        Progress Toward Goals  OT Goals(current goals can now be found in the care plan section)  Progress towards OT goals: OT to reassess next treatment  Acute Rehab OT Goals Patient Stated Goal: to get stronger OT Goal Formulation: With patient/family Time For Goal Achievement: 10/06/18 Potential to Achieve Goals: Fair ADL Goals Pt Will Perform Grooming: with max assist;bed level Pt Will Perform Upper Body Bathing: with max assist;bed level Pt/caregiver will Perform Home Exercise Program: Increased ROM;With written HEP provided;Both right and left upper extremity Additional ADL Goal #1: Pt will sit EOB with +2 Max A x 10 min in preparation for functional activities. Additional ADL Goal #3: Pt will tolerate 2 hrs out of bed in whellchair to increase activity tolerance. Additional ADL Goal #4: Pt will dmeonstrate to pick up washcloth with L hand to increase functional use L UE for ADL  Plan Discharge plan remains appropriate    Co-evaluation                 AM-PAC OT "6 Clicks" Daily Activity     Outcome Measure   Help from another person eating meals?: Total Help from another person taking care of personal grooming?: Total Help from another person toileting, which includes using toliet, bedpan, or urinal?: Total Help from another person bathing (including washing, rinsing, drying)?: Total Help from another person to put on and taking off regular upper body clothing?: Total Help from another person to put on and taking off regular lower body clothing?: Total 6 Click Score: 6    End of Session Equipment Utilized During Treatment: Oxygen  OT Visit Diagnosis: Other abnormalities of gait and mobility (R26.89);Muscle weakness (generalized) (M62.81);Low vision, both eyes (H54.2);Feeding difficulties (R63.3);Other  symptoms and signs involving cognitive function;Hemiplegia and hemiparesis;Pain Hemiplegia - Right/Left: Right Hemiplegia - dominant/non-dominant: Non-Dominant Hemiplegia - caused by: Nontraumatic intracerebral hemorrhage;Cerebral infarction Pain - part of body: (general discomfort)   Activity Tolerance Patient tolerated treatment well   Patient Left in chair;with call bell/phone within reach;with family/visitor present   Nurse Communication Mobility status        Time: 4315-4008 OT Time Calculation (min): 46 min  Charges: OT General Charges $OT Visit: 1 Visit OT Treatments $Self Care/Home Management : 8-22 mins $Neuromuscular Re-education: 23-37 mins  Maurie Boettcher, OT/L   Acute OT Clinical Specialist [place] Pager [phone] Office [phone]    [facility] 09/29/2018, 2:53 PM

## 2018-09-29 NOTE — Progress Notes (Signed)
Nutrition Follow-up  DOCUMENTATION CODES:   Morbid obesity  INTERVENTION:  Continue Vital AF 1.2 formula at goal rate of 65 ml/hr via PEG.  Tube feeding to provide 1872 kcal, 117 grams of protein, and 1264 ml water.   Once IV fluids are discontinued, recommend free water flushes of 150 ml TID.   NUTRITION DIAGNOSIS:   Inadequate oral intake related to inability to eat as evidenced by NPO status; ongoing  GOAL:   Patient will meet greater than or equal to 90% of their needs; met with TF  MONITOR:   Diet advancement, Labs, Weight trends, Skin, I & O's  REASON FOR ASSESSMENT:   Consult Enteral/tube feeding initiation and management  ASSESSMENT:   Andrea [name] is a 57 yo female with PMH of type 2 diabetes, HTN, CKD III, obesity, and anemia admitted for chest pain. Code Stroke called 10/10 in AM. 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 Intubated 10/10 in ICU.  10/21 trach and PEG. 11/1 ex lap, I&D.  Diarrhea has improved after formula change to Vital AF 1.2. Pt has been tolerating her tube feeding. RD to continue with current tube feeding orders. Recommend addition of free water flushes once IV fluids are discontinued. Plans for SNF upon discharge. Labs and medications reviewed.   Diet Order:   Diet Order            Diet NPO time specified  Diet effective ____              EDUCATION NEEDS:   Not appropriate for education at this time  Skin:  Skin Assessment: Skin Integrity Issues: Skin Integrity Issues:: Incisions Stage II: healed Incisions: abdomen  Last BM:  11/24  Height:   Ht Readings from Last 1 Encounters:  09/03/18 5' 4"  (1.626 m)    Weight:   Wt Readings from Last 1 Encounters:  09/19/18 123 kg    Ideal Body Weight:  54.5 kg  BMI:  Body mass index is 46.55 kg/m.  Estimated Nutritional Needs:   Kcal:  1600-1800  Protein:  120-135 grams  Fluid:  > 1.6 L    Andrea Parker, MS, RD, LDN Pager # [phone] After hours/ weekend pager  # [phone]

## 2018-09-29 NOTE — Progress Notes (Signed)
STROKE TEAM PROGRESS NOTE   SUBJECTIVE (INTERVAL HISTORY) No family is at bedside. Family and patient has met with palliative care and want to continue full support.They have not had too many nursing home offers yet in [facility] is considering but has not yet informed us about their decision OBJECTIVE Vitals:   09/29/18 0400 09/29/18 0521 09/29/18 0800 09/29/18 1155  BP: (!) 144/71 (!) 144/71 (!) 169/85 130/69  Pulse: 83 92 93 61  Resp: (!) 22 (!) 30 (!) 21 14  Temp: 98.1 F (36.7 C)  98.6 F (37 C) 98.2 F (36.8 C)  TempSrc: Oral  Oral Oral  SpO2: 96% 100% 99% 100%  Weight:      Height:        CBC:  Recent Labs  Lab 09/26/18 0507 09/29/18 0726  WBC 8.5 11.9*  HGB 8.7* 9.2*  HCT 29.1* 31.0*  MCV 88.7 87.1  PLT 365 878    Basic Metabolic Panel:  Recent Labs  Lab 09/26/18 0507 09/29/18 0901  NA 142 143  K 4.3 4.1  CL 114* 114*  CO2 22 24  GLUCOSE 141* 152*  BUN 24* 26*  CREATININE 0.69 0.74  CALCIUM 10.5* 10.6*    Lipid Panel:     Component Value Date/Time   CHOL 256 (H) 08/15/2018 0512   TRIG 248 (H) 08/15/2018 0512   HDL 35 (L) 08/15/2018 0512   CHOLHDL 7.3 08/15/2018 0512   VLDL 50 (H) 08/15/2018 0512   LDLCALC 171 (H) 08/15/2018 0512   HgbA1c:  Lab Results  Component Value Date   HGBA1C 11.4 (H) 08/15/2018   Urine Drug Screen:     Component Value Date/Time   LABOPIA NONE DETECTED 08/10/2018 1836   COCAINSCRNUR NONE DETECTED 08/10/2018 1836   LABBENZ NONE DETECTED 08/31/2018 1836   AMPHETMU NONE DETECTED 08/24/2018 1836   THCU NONE DETECTED 08/05/2018 1836   LABBARB NONE DETECTED 08/18/2018 1836    Alcohol Level No results found for: ETH  IMAGING  Ct Angio Head W Or Wo Contrast Ct Angio Neck W Or Wo Contrast 08/15/2018 IMPRESSION:  1. Interval basilar to left proximal PCA stenting. The density of the stent walls precludes detection of in stent stenosis; there is a degree of wasting at the mid basilar segment. There is flow in the left  PCA beyond the stent such that there is presumed stent patency.  2. Known lower pontine infarct. There are new small cerebellar infarcts since brain MRI yesterday.  3. Severe left P2 segment stenosis, also seen previously.  4. Moderate atheromatous narrowing in the proximal left MCA.    Ct Angio Head W Or Wo Contrast Ct Angio Neck W Or Wo Contrast 08/15/2018 IMPRESSION:  1. 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 No definite acute thrombus identified.  2. Severe stenosis left posterior cerebral artery and moderate stenosis right posterior cerebral artery.  3. Mild stenosis left MCA and moderate stenosis left MCA bifurcation.  4. No significant carotid or vertebral artery stenosis in the neck.    Ct Head Wo Contrast 08/24/2018 IMPRESSION:  No acute intracranial abnormalities. Mild white matter changes likely due to small vessel ischemia.    Mr Brain Wo Contrast 08/15/2018 IMPRESSION:  1. Acute nonhemorrhagic infarct involving the left paramedian brainstem. The infarct crosses midline.  2. Other periventricular and subcortical white matter disease is moderately advanced for age. This likely reflects the sequela of chronic microvascular ischemia.  3. Tapering of the  dens with prominent soft tissue pannus. 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    Ct Head Code Stroke Wo Contrast 08/28/2018  IMPRESSION:  1. No acute abnormality and no change from earlier today  2. ASPECTS is 10 3.    Cerebral Angiogram 08/31/2018 S/P 4 vessel cerebral arteriogram RT CFA approach. 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 S/P stent assisted angioplasty of distal basilar artery with patency of 80 to 90 %. 2.Approx 70 % stenosis of LT ICA supraclinoid seg   MRI 08/15/18 1. Progressive acute pontine infarct. New patchy  bilateral cerebellar infarction. New tiny left thalamic infarcts. 2. Preserved flow void in the stented basilar. 3. left facial/submandibular edema, please correlate with neck exam. Edited result: IMPRESSION: 1. Progressive acute pontine infarct. New patchy bilateral cerebellar infarction. New tiny left thalamic infarcts. 2. Preserved flow void in the stented basilar.  TTE - Left ventricle: The cavity size was normal. There was severe   concentric hypertrophy. Systolic function was normal. The   estimated ejection fraction was in the range of 60% to 65%. Wall   motion was normal; there were no regional wall motion   abnormalities. Doppler parameters are consistent with abnormal   left ventricular relaxation (grade 1 diastolic dysfunction). The   E/e&' ratio is <8, suggesting normal LV filling pressure. - Aortic valve: Trileaflet; mildly calcified leaflets.   Transvalvular velocity was minimally increased. There was no   regurgitation. Mean gradient (S): 12 mm Hg. - Mitral valve: Mildly thickened leaflets . There was trivial   regurgitation. - Left atrium: The atrium was normal in size. - Inferior vena cava: The vessel was dilated. The respirophasic   diameter changes were blunted (< 50%), consistent with elevated   central venous pressure. Impressions: - LVEF 60-65%, severe LVH, normal wall motion, grade 1 DD, normal   LV filling pressure, minimally increased aortic velocity without   signficant stenosis, trivial MR, normal LA size, dilated IVC.  CT abdomen and pelvis 09/03/18 Mild bilateral posterior basilar subsegmental atelectasis. Interval placement of new gastrostomy tube with tip and balloon within gastric lumen. Surgical drain is seen in the soft tissues in previous gastrostomy site. Small amount of gas and stranding is seen in the peritoneal fat anterior and inferior to the stomach which most likely is related to gastrostomy placement. Probable surgical wound seen in  right upper quadrant of the anterior abdominal wall. 8.2 x 2.3 cm gas and fluid collection is seen in the anterior abdominal wall which is unchanged compared to prior exam. Possible abscess cannot be excluded. Enlarged fibroid uterus.  PHYSICAL EXAM  Temp:  [97.9 F (36.6 C)-98.6 F (37 C)] 98.2 F (36.8 C) (11/25 1155) Pulse Rate:  [61-93] 61 (11/25 1155) Resp:  [14-30] 14 (11/25 1155) BP: (128-169)/(68-85) 130/69 (11/25 1155) SpO2:  [96 %-100 %] 100 % (11/25 1155) FiO2 (%):  [28 %] 28 % (11/25 1139)  General -  Obese middle-aged African-American lady, on trach collar, not in distress. She has abdominal surgical wound and drainage from the abdominal wall, dressing clean  Ophthalmologic - fundi not visualized due to noncooperation.  Cardiovascular - regular rate and rhythm  Neuro - on trach collar, eyes open, awake, alert. No ptosis or EOMI, denies diplopia.  PERRL. Bilateral facial weakness, tongue protrusion weak. Able to wiggle left hand fingers and left foot toes. 2-/5 LLE and 2/5 LUE. Follows commands and good   eye contact.  Speech is moderate dysarthric with speaking valve.   Dense right hemiplegia persists.  ASSESSMENT/PLAN Ms. Brigida T Dickard is a 57 y.o. female with history of difficult to control hypertension, insulin-dependent diabetes, obesity, hypothyroidism status post ablation presenting with chest discomfort, right-sided weakness, slurred speech and right facial droop. She did not receive IV t-PA due to late presentation. S/P stent assisted angioplasty of distal basilar artery.  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 Worsening symptoms with extension of pontine/medullary infarcts with new small bilateral cerebellar infarcts without evidence of stent re-stenosis or occlusion  Resultant quadriplegia, on trach and peg  CT head - No acute intracranial abnormalities.  CTA H&N 08/29/2018 - Extensive atherosclerotic disease  in the basilar with focal critical stenosis in the mid to distal basilar. No definite acute thrombus identified. Severe stenosis left posterior cerebral artery  MRI head - Acute nonhemorrhagic infarct involving the left paramedian brainstem.   CTA H&N 08/15/2018 - stent too dense to see BA lumen, but distal flow preserved. New small cerebellar infarcts since brain MRI yesterday.  Severe left P2 segment stenosis, also seen previously.   Repeat MRI - extension of b/l pontine and upper medullary infarcts with new b/l cerebellar infarcts.  2D Echo - EF 60-65%  LDL - 174  HgbA1c - 11.1  VTE prophylaxis - heparin subq  aspirin 81 mg daily prior to admission, now resumed on Brilinta 90 mg bid and ASA 81 mg daily.   Patient counseled to be compliant with her antithrombotic medications  Ongoing aggressive stroke risk factor management  Therapy recommendations:  SNF  Disposition:  SNF  Placement - palliative care service has discussed with pt using speaking valve and clearly pt would like full aggressive care. SW still working on the placement which is very difficult at this time.    Abnormal uterine bleeding, improved  Has been following with GYN  Was on megace in the past  CT abd/pelvis 09/03/18 - enlarged fibroid uterus  continue megace 80mg bid  Continue ASA and brilinta for now  OBGYN recs appreciated  Gave prmarin 25mg IV as recommended - vaginal bleeding much improved  Close CBC monitoring  Anemia   Hb 6.8->PRBC->7.1->9.0-7.7->7.0->PRBC->8.6->8.5->8.3->8.7  Likely due to iron deficiency, heavy periods and acute uterine bleeding and recent surgeries  Iron panel showed iron deficiency  On iron solutions  PRBC transfusion 1 U 09/21/2018 and 2U 09/07/18 and 2U 09/21/18  CBC monitoring  Intracranial stenosis  BA mid to distal severe stenosis s/p BA stenting  Left PCA severe stenosis, R PCA moderate stenosis  Left MCA moderate stenosis  Uncontrolled stroke  risk factors with HLD, HTN, DM  Non compliance with meds at home  Respiratory failure  S/p trach 08/29/2018  On trach collar now  Able to speak with speaking valve although dysarthric  Trach secretion decreasing   Discussed with CCM and considering decannulation of the trach  Will follow up with CCM and RT next week   Hypertension  Stable so far  BP goal 130-150   On Coreg 25 bid, amlodipine and hydralazine  Close monitoring  Hyperlipidemia  Lipid lowering medication PTA:  Lipitor 20 mg daily  LDL 174, goal < 70  Increased to 80 mg daily  Continue statin at discharge  Diabetes  HgbA1c 11.4, goal < 7.0  Uncontrolled at home  Glucose stable after insulin adjustment  Decrease Lantus again today to 15U bid   Decrease basal NovoLog again today   to 3U Q4h  SSI  CBG monitoring  Low grade fever with leukocytosis, resolved  Tmax 101.6 -> afebrile  Leukocytosis - 14.0->12.1->9.9->11.2->11.5->11.3->9.5->9.9->9.1->8.5  UA WBC 21-50  CXR unremarkable, improving from prior  Blood cultures negative 10/26   Respiratory cultures - mixed flora   Abdominal abscess culture showed proteus mirabilis  Off antibiotics - completed course  Hypernatremia -> hyponatremia-> resolved  Na 149->148->150->147->144->143->134->133->137->140->138->142  On TF @ 65  on NS @ 20  Continue BMP monitoring  Dysphagia s/p PEG complicated by abdominal wall abscess  Continue tube feeding @ 45 cc/h  PEG done 09/04/2018  Removed and replaced dislodged PEG 08/27/2018  CT showed Abdominal wall abscess - s/p open surgical drainage 08/24/2018 persistent foul odor discharge. Reexploration 09/25/2018  Culture showed peroteus mirabilis  Off meropenum  Trauma surgery following, appreciate recs  Wound debrided appropriately and sutures removed.  No evisceration  Diarrhea, resolved  Likely related to TF  Dietitian has made formula change  Laxatives discontinued  Imodium  scheduled dose changed to PRN  Other Stroke Risk Factors  Obesity, Body mass index is 46.55 kg/m., recommend weight loss, diet and exercise as appropriate   Other Active Problems  Hypokalemia - resolved  Thrombocytosis - likely due to anemia, resolved  Hospital day # 46 Patient and family want full support. SNF options are few and difficult. D/w case Freight forwarder. This patient is critically ill and at significant risk of neurological worsening, death and care requires constant monitoring of vital signs, hemodynamics,respiratory and cardiac monitoring, extensive review of multiple databases, frequent neurological assessment, discussion with family, other specialists and medical decision making of high complexity.I have made any additions or clarifications directly to the above note.This critical care time does not reflect procedure time, or teaching time or supervisory time of PA/NP/Med Resident etc but could involve care discussion time.  I spent 30 minutes of neurocritical care time  in the care of  this patient.     Antony Contras, MD Stroke Neurology 09/29/2018 2:26 PM   To contact Stroke Continuity provider, please refer to [email or web address] After hours, contact General Neurology

## 2018-09-29 NOTE — Progress Notes (Signed)
CSW following for discharge plan. CSW reached out to Admissions at [facility] to discuss potential bed offer for patient. Awaiting return call.  CSW to follow.  Laveda Abbe, [place] Clinical Social Worker [phone]

## 2018-09-30 DIAGNOSIS — R0902 Hypoxemia: Secondary | ICD-10-CM

## 2018-09-30 LAB — GLUCOSE, CAPILLARY
GLUCOSE-CAPILLARY: 163 mg/dL — AB (ref 70–99)
GLUCOSE-CAPILLARY: 112 mg/dL — AB (ref 70–99)
Glucose-Capillary: 120 mg/dL — ABNORMAL HIGH (ref 70–99)
Glucose-Capillary: 150 mg/dL — ABNORMAL HIGH (ref 70–99)
Glucose-Capillary: 136 mg/dL — ABNORMAL HIGH (ref 70–99)
Glucose-Capillary: 128 mg/dL — ABNORMAL HIGH (ref 70–99)

## 2018-09-30 MED ORDER — GABAPENTIN 250 MG/5ML PO SOLN
100.0000 mg | Freq: Two times a day (BID) | ORAL | Status: DC
  Administered 2018-09-30 – 2018-11-08 (×77): 100 mg
  Filled 2018-09-30 (×7): qty 2
  Filled 2018-09-30: qty 3
  Filled 2018-09-30 (×9): qty 2
  Filled 2018-09-30: qty 3
  Filled 2018-09-30 (×5): qty 2
  Filled 2018-09-30: qty 3
  Filled 2018-09-30 (×2): qty 2
  Filled 2018-09-30: qty 3
  Filled 2018-09-30 (×33): qty 2
  Filled 2018-09-30 (×2): qty 3
  Filled 2018-09-30 (×23): qty 2

## 2018-09-30 NOTE — Progress Notes (Signed)
Daily Progress Note   Patient Name: Andrea [name]       Date: 09/30/2018 DOB: [birth year]  Age: 57 y.o. MRN#: [id number] Attending Physician: Garvin Fila, MD Primary Care Physician: Danna Hefty, DO Admit Date: 09/04/2018  Reason for Consultation/Follow-up: Establishing goals of care  Subjective: Andrea [name] is asleep but arouses easily to my voice. She notes diarrhea is better and not hurting her now. Sleeping better.  She is worried about her disposition. Asks me if there has been a place found for her. I let her know that per SW notes- no place has been found. She states she is worried she is going to be thrown out- we discussed that the hospital can't send her out with nowhere to go. Gave her emotional support. SLP in at close of our conversation and also reiterated same information regarding placement.  Andrea [name] continues to desire full scope care.  ROS  Length of Stay: 47  Current Medications: Scheduled Meds:  . amLODipine  10 mg Oral Daily  . aspirin  81 mg Per Tube Daily  . atorvastatin  80 mg Per Tube q1800  . baclofen  10 mg Oral TID  . carvedilol  25 mg Per Tube BID WC  . chlorhexidine  15 mL Mouth Rinse BID  . escitalopram  10 mg Per Tube Daily  . feeding supplement (VITAL AF 1.2 CAL)  1,500 mL Per Tube Q24H  . ferrous sulfate  300 mg Oral TID WC  . gabapentin  100 mg Per Tube Q12H  . hydrALAZINE  25 mg Oral Q8H  . insulin aspart  0-20 Units Subcutaneous Q4H  . insulin aspart  3 Units Subcutaneous Q4H  . insulin glargine  15 Units Subcutaneous BID  . ipratropium-albuterol  3 mL Nebulization BID  . liver oil-zinc oxide   Topical QID  . LORazepam  0.25 mg Per Tube QHS  . mouth rinse  15 mL Mouth Rinse q12n4p  . megestrol  80 mg Oral BID  . multivitamin  15 mL Per  Tube Daily  . nystatin  5 mL Oral QID  . pantoprazole sodium  40 mg Per Tube Daily  . sodium chloride HYPERTONIC  4 mL Nebulization BID  . thiamine  100 mg Per Tube Daily  . ticagrelor  90 mg Per Tube BID  Continuous Infusions: . sodium chloride Stopped (09/24/18 1144)    PRN Meds: acetaminophen (TYLENOL) oral liquid 160 mg/5 mL, albuterol, barrier cream, diphenhydrAMINE-zinc acetate, fentaNYL (SUBLIMAZE) injection, guaiFENesin, hydrALAZINE, HYDROcodone-acetaminophen, labetalol, loperamide, ondansetron (ZOFRAN) IV  Physical Exam          Vital Signs: BP (!) 148/79 (BP Location: Left Arm)   Pulse 79   Temp 98 F (36.7 C)   Resp (!) 26   Ht 5\' 4"  (1.626 m)   Wt 123 kg   LMP 07/26/2018   SpO2 100%   BMI 46.55 kg/m  SpO2: SpO2: 100 % O2 Device: O2 Device: Tracheostomy Collar O2 Flow Rate: O2 Flow Rate (L/min): 5 L/min  Intake/output summary:   Intake/Output Summary (Last 24 hours) at 09/30/2018 1824 Last data filed at 09/30/2018 1800 Gross per 24 hour  Intake 2150 ml  Output 800 ml  Net 1350 ml   LBM: Last BM Date: 09/27/18 Baseline Weight: Weight: 122.5 kg Most recent weight: Weight: 123 kg       Palliative Assessment/Data:      Patient Active Problem List   Diagnosis Date Noted  . Advance care planning   . Functional diarrhea   . Goals of care, counseling/discussion   . Palliative care by specialist   . Advanced care planning/counseling discussion   . Sepsis with acute respiratory failure without septic shock ([place])   . Acute respiratory failure ([place])   . Tracheostomy present ([place])   . Tracheostomy status ([place])   . Hypertensive urgency 08/12/2018  . CKD (chronic kidney disease), stage III ([place]) 09/04/2018  . Acute ischemic stroke ([place]) 08/06/2018  . Basilar artery stenosis with infarction ([place]) 08/22/2018  . Respiratory insufficiency   . Low back pain 01/29/2017  . Cholelithiasis without cholecystitis 01/18/2017  . Anemia, iron deficiency  01/17/2017  . Abdominal pain   . Hyperglycemia   . Increased anion gap metabolic acidosis   . Weight loss 04/09/2016  . Headache 04/09/2016  . Fibroids 04/14/2014  . Abnormal uterine bleeding 04/14/2014  . Unequal leg length 04/16/2013  . Lightheadedness 12/17/2012  . Chest pain 12/14/2012  . Morbid obesity ([place]) 12/29/2011  . High cholesterol   . 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 ANXIETY 02/20/2010  . GERD 01/30/2010  . THYROID NODULE 01/17/2010  . THYROMEGALY 01/16/2010  . DEPRESSION 01/16/2010  . Insulin-requiring or dependent type II diabetes mellitus ([place]) 09/18/2007  . Essential hypertension 09/18/2007    Palliative Care Assessment & Plan   Patient Profile: 57 y.o.femalewith past medical history of diabetes, HTN, heart failure, CKD, anxiety, depression, hyperthyroidadmitted on 10/9/2019with symptoms of slurring speech, R facial droop, difficulty breathing.Workup eventually revealed evolving pontine stroke, occluded basilar artery (underwent angiogram with stenting). Post-op she remained hypertensive and followup CT showed progressing infarction into her brainstem and new cerebellar infarcts- she required intubation with eventual trach and PEG on 10/21. She was progressing with therapies and off of vent for a few days, however, began to decline again with lethargy, fevers. Purulent drainage from her PEG noted around 10/28. CT scan showed dislodged PEG tube with abdominal wall thickening and air, but no fluid collection or abscess, however, patient was very tender and there was foul smelling drainage from the wound. She went to OR 10/28 for I&D and replacement of G-tube. Op note shows that G-tube tip was found to be in the subcutaneous tissue, penrose drain was placed into a subcutaneous abscess. Now post op, remains on vent support. Minimal progression in PT, less participative. Palliative  medicine consulted for [place]. Patient remains quadraplegic, with trach and PEG.    Assessment/Recommendations/Plan   GOC are set- full scope, full code  PMT will shadow for now and main issue is disposition- Unfortunately we reached out to CIR who kindly evaluated her situation, and patient is not a good candidate due to requiring full assistance  Please call if further assistance is needed  Goals of Care and Additional Recommendations:  Limitations on Scope of Treatment: Full Scope Treatment  Code Status:  Full code  Prognosis:   Unable to determine  Discharge Planning:  To Be Determined  Care plan was discussed with patient.  Thank you for allowing the Palliative Medicine Team to assist in the care of this patient.   Time In: 1400 Time Out: 1425 Total Time 25 minutes Prolonged Time Billed no      Greater than 50%  of this time was spent counseling and coordinating care related to the above assessment and plan.  Mariana Kaufman, AGNP-C Palliative Medicine   Please contact Palliative Medicine Team phone at [phone] for questions and concerns.

## 2018-09-30 NOTE — Progress Notes (Addendum)
NAME:  Andrea [name], MRN:  [id number], DOB:  [birth year], LOS: 51 ADMISSION DATE:  08/28/2018, CONSULTATION DATE:  08/10/2018 REFERRING MD:  Dr. Rory Percy, CHIEF COMPLAINT:  CP/ Acute CVA  Brief History   3 yoF w/poorly controlled HTN and IDDM presenting with CP and left arm pain w/BP 220/117. Initial CTH neg.  Cardiac workup negative for acute event thus far, cardiology following.  Code stroke initiated for R hemiplegia, dysarthria and right facial droop.  Out of window for TPA.  Requiring cleviprex for BP control.  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  Evolving pontine stroke on MRI. Intubated 10/10 for neuro IR s/p stenting and angioplasty with 80-90% patency.  Extubated post procedure but due to insufficency placed on BiPAP and brought to ICU.  Required reintubation and ultimately trach (10/21 JY).  Peg tube placed 10/21 by CCS.  Taken to OR 10/28 for open gastrostomy and I&D of abdominal wall abscess. 11/1 taken back to OR for repeat ex-lap.     Past Medical History  HTN, IDDM, hypothyroidism s/p ablation  Significant Hospital Events   10/9 present to [facility] ED -transferred to [facility] 10/9 basilar artery stenting 10/9 intubated urgently 6h post procedure for inability to protect airway. 10/10 CTA for declining exam - evolving pontine infarct, stent deemed to be patent 10/14 Brillinta stopped and ASA/ heparin started to in anticipation of tracheostomy on Monday. 10/14 through 10/19: Neuro exam has been stable sodium increasing in spite of free water via tube, providing supportive care awaiting trach 10/20: Sodium down to 151 from 156 clinically looks the same 10/21: Sodium down to 148, sputum culture sent for purulent tracheal secretions, awaiting trach and PEG.  Purulent bronchial secretions from right upper lobe sent for BAL 10/22: Tracheostomy and PEG completed on the 21st tolerated well.  Spiking low-grade fever white cells continued to climb starting empiric  vancomycin and ceftaz.  Sodium improved down to 146.  Blood pressure improving but still not at goal added hydrochlorothiazide.  Resumed Brilinta.  10/23: Sodium normal.  Backing down on free water.  Glycemic control challenging, increased basal NovoLog in addition to Lantus and sliding scale.  Still spiking fever, white blood cell count climbing, sending blood cultures.  Foley catheter being placed for pressure ulcer caused by pure wick device 10/27 Foul smelling dark trach secretions, PEG secretions  10/28 to OR for ex lap due to PEG dislodgement.  PEG replaced 10/29 remains on MV 2/2 tachypnea; family meeting 10/30 no weaning 2/2 to ongoing tachypnea; foul smelling drainage from abd site 10/31 no weaning, abx changed, family meeting 11/1 repeat Ex-lap for I&D of absces 11/1 tolerated > 48h trach collar trial 11/7 on ATC since 11/12 trach changed 11/13 not tolerating PMV 11/15 developed heavy vaginal bleeding. GYN called. Recommended transvag US.  11/16 started on IV estrogen per recs of GYN, also told to cont megace. Vaginal bleeding improving 11/17 copious Trach secretions. Neuro added HT saline nebs got 2 units PRBC for hgb of 7 11/19 seen by SLP. Has made progress. Repeat FEEs ordered  Consults: date of consult/date signed off & final recs:  Cards 10/10 Neurology 10/10 PCCM 10/10 CCS 10/17  Procedures (surgical and bedside):  10/10 Neuro IR >> s/p stent and angioplasty of distal basilar artery  10/10 ETT for procedure 10/10 ETT (reintubated) >> removed 10/21 10/10 Size 6 tracheostomy placed by Dr. Nelda Marseille 10/26 changed to #4 trach cuffless  10/28 Ex lap with replacement of gastrostomy tube due  to dislodgement 11/1 repeat Ex-lap with I&D of abdominal wall abscess  11/12 trach change  Significant Diagnostic Tests:  10/10  CTH >>  No acute intracranial abnormalities. 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 No definite acute thrombus identified. Severe stenosis left posterior cerebral artery and moderate stenosis right posterior cerebral artery. Mild stenosis left MCA and moderate stenosis left MCA bifurcation. No significant carotid or vertebral artery stenosis in the neck. 10/10 MRI >> evolving pontine infarct. 10/11 ECHO >> normal LVSF with severe LVH. 10/28 CT A / P > dislodged PEG tube outside of the peritoneum and not within the stomach.  Cholelithiasis. No bowel obstruction.  No hydronephrosis.  10/30 CT abd/pelvis >> Mild bilateral atelectasis.  Interval placement of new gastrostomy tube with tip and balloon within gastric lumen. Surgical drain is seen in the soft tissues in previous gastrostomy site. Small amount of gas and stranding is seen in the peritoneal fat anterior and inferior to the stomach which most likely is related to gastrostomy placement. Probable surgical wound seen in right upper quadrant of the anterior abdominal wall.  8.2 x 2.3 cm gas and fluid collection is seen in the anterior abdominal wall which is unchanged compared to prior exam. Possible abscess cannot be excluded.  Enlarged fibroid uterus.  11/14 B LE venous dopplers  >>  Micro Data:  10/10 MRSA PCR >> negative 10/21 Sputum culture >> nl resp flora  10/23 Blood cultures x 2 >> negative 10/26 Blood cultures x 2 >> neg 10/28 MRSA PCR >> neg 10/27 Sputum culture >> normal flora 11/1 Abdominal abscess> kelbsiella, proteus, susceptible to imipenem, amp-sulbactam, ESBL  Antimicrobials:  10/10 cefazolin preop 10/22 Vancomycin >> 10/30 10/22 Ceftaz >> 10/31 10/31 ceftriaxone >> 11/4 10/31 flagyl >> 11/4 11/4 meropenem >> 11/9  Subjective:  No distress.  Trach change 11/25 per RT>> tolerated well Cough remains weak per speech note 11/25 28%  ATC Thrush  Objective   Blood pressure (!) 143/71, pulse 74, temperature 98.3 F (36.8 C), temperature source Oral, resp. rate 15, height _0  (1.626 m), weight 123 kg, last menstrual period 07/26/2018, SpO2 100 %.    FiO2 (%):  [28 %] 28 %   Intake/Output Summary (Last 24 hours) at 09/30/2018 0950 Last data filed at 09/30/2018 0700 Gross per 24 hour  Intake 1715 ml  Output 500 ml  Net 1215 ml   Filed Weights   09/17/18 0500 09/18/18 0500 09/19/18 0500  Weight: 128.4 kg 76 kg 123 kg   Examination:  General: 57 year old African-American female currently resting in bed she is in no acute distress, awake and alert, NAD HEENT normocephalic atraumatic size 4 cuffless tracheostomy is unremarkable , used PM valve last night to speak with sister, strong phonation per RN>> Thrush Pulmonary: Diminished bases no accessory use, few rhonchi throughout Cardiac: S1, S2, Regular rate and rhythm without rub, murmur gallop Abdomen: Soft nontender , ND, BS +, no organomegaly tolerating tube feeds Extremities: Warm dry + dependent edema,  foot drop boots in place Neuro: Awake, and alert,  follows commands, disoriented at times  Resolved problems  Acute respiratory failure abd wall abscess s/p exploratory lap I&D on 10/28 and repeat take back 11/1  Current problem list   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  HTN Neuropathic pain  Uterine bleeding Acute blood loss anemia  Wound care s/p I&D of abd wall abscess DM hypothyroidism  Current Pulmonary specific problem list    Tracheostomy dependence d/t ineffective airway clearance & poor cough mechanics after CVA - s/p JY trach 10/10 - on ATC since 11/6  -She continues to have weak cough mechanics per speech eval 11/25,   not  a candidate for decannulation, and may not be  for some time if at all Plan Continue aerosol trach collar Mobilize as able PRN bronchodilators Routine tracheostomy change per protocols PCCM  will see weekly She will need follow-up in trach clinic at time of discharge  Dysphasia following CVA Failed swallow 11/25>> weak cough Plan Continue tube feeds Swallow follow up per speech>> appreciate assist  Oral Thrush Plan Continue  Nystatin mouthwash Frequent mouthcare   Magdalen Spatz, AGACNP-BC [place] Pager  # [phone]  09/30/2018 9:50 AM  Attending Note:  57 year old female with HTN presenting with CVA requiring trach/peg.  PCCM following for trach management.  On exam, patient is off the vent and doing well with clear lungs.  I reviewed CXR myself, trach is in a good position.  Discussed with PCCM-NP.    Respiratory failure:  - Maintain off vent as able  Hypoxemia:  - Titrate O2 for sat of 88-92%  Trach status:  - Maintain current trach type and size  - Trach care per protocol  Dysphagia: failed swallow  - TF via PEG  PCCM will continue to follow.  Patient seen and examined, agree with above note.  I dictated the care and orders written for this patient under my direction.  Rush Farmer, [place]

## 2018-09-30 NOTE — Progress Notes (Signed)
STROKE TEAM PROGRESS NOTE   SUBJECTIVE (INTERVAL HISTORY) No family is at bedside. .She has  not had any nursing home offers yet and [name] is considering but has not yet informed us about their decision.She continues to have thick yellow secretions requiring frequent suctioning OBJECTIVE Vitals:   09/30/18 0859 09/30/18 0930 09/30/18 1200 09/30/18 1327  BP:   134/70   Pulse: 74 69 71 74  Resp: 15 (!) 23 (!) 26 (!) 22  Temp:   98.6 F (37 C)   TempSrc:   Oral   SpO2: 100% 99% 99% 100%  Weight:      Height:        CBC:  Recent Labs  Lab 09/26/18 0507 09/29/18 0726  WBC 8.5 11.9*  HGB 8.7* 9.2*  HCT 29.1* 31.0*  MCV 88.7 87.1  PLT 365 578    Basic Metabolic Panel:  Recent Labs  Lab 09/26/18 0507 09/29/18 0901  NA 142 143  K 4.3 4.1  CL 114* 114*  CO2 22 24  GLUCOSE 141* 152*  BUN 24* 26*  CREATININE 0.69 0.74  CALCIUM 10.5* 10.6*    Lipid Panel:     Component Value Date/Time   CHOL 256 (H) 08/15/2018 0512   TRIG 248 (H) 08/15/2018 0512   HDL 35 (L) 08/15/2018 0512   CHOLHDL 7.3 08/15/2018 0512   VLDL 50 (H) 08/15/2018 0512   LDLCALC 171 (H) 08/15/2018 0512   HgbA1c:  Lab Results  Component Value Date   HGBA1C 11.4 (H) 08/15/2018   Urine Drug Screen:     Component Value Date/Time   LABOPIA NONE DETECTED 08/09/2018 1836   COCAINSCRNUR NONE DETECTED 08/23/2018 1836   LABBENZ NONE DETECTED 08/21/2018 1836   AMPHETMU NONE DETECTED 08/20/2018 1836   THCU NONE DETECTED 08/29/2018 1836   LABBARB NONE DETECTED 09/01/2018 1836    Alcohol Level No results found for: ETH  IMAGING  Ct Angio Head W Or Wo Contrast Ct Angio Neck W Or Wo Contrast 08/15/2018 IMPRESSION:  1. Interval basilar to left proximal PCA stenting. The density of the stent walls precludes detection of in stent stenosis; there is a degree of wasting at the mid basilar segment. There is flow in the left PCA beyond the stent such that there is presumed stent patency.  2. Known lower  pontine infarct. There are new small cerebellar infarcts since brain MRI yesterday.  3. Severe left P2 segment stenosis, also seen previously.  4. Moderate atheromatous narrowing in the proximal left MCA.    Ct Angio Head W Or Wo Contrast Ct Angio Neck W Or Wo Contrast 08/20/2018 IMPRESSION:  1. 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 No definite acute thrombus identified.  2. Severe stenosis left posterior cerebral artery and moderate stenosis right posterior cerebral artery.  3. Mild stenosis left MCA and moderate stenosis left MCA bifurcation.  4. No significant carotid or vertebral artery stenosis in the neck.    Ct Head Wo Contrast 08/25/2018 IMPRESSION:  No acute intracranial abnormalities. Mild white matter changes likely due to small vessel ischemia.    Mr Brain Wo Contrast 08/30/2018 IMPRESSION:  1. Acute nonhemorrhagic infarct involving the left paramedian brainstem. The infarct crosses midline.  2. Other periventricular and subcortical white matter disease is moderately advanced for age. This likely reflects the sequela of chronic microvascular ischemia.  3. Tapering of the dens with prominent soft tissue pannus. 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  Ct Head Code Stroke Wo Contrast 09/04/2018  IMPRESSION:  1. No acute abnormality and no change from earlier today  2. ASPECTS is 10 3.    Cerebral Angiogram 08/29/2018 S/P 4 vessel cerebral arteriogram RT CFA approach. 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 S/P stent assisted angioplasty of distal basilar artery with patency of 80 to 90 %. 2.Approx 70 % stenosis of LT ICA supraclinoid seg   MRI 08/15/18 1. Progressive acute pontine infarct. New patchy bilateral cerebellar infarction. New tiny left thalamic infarcts. 2. Preserved  flow void in the stented basilar. 3. left facial/submandibular edema, please correlate with neck exam. Edited result: IMPRESSION: 1. Progressive acute pontine infarct. New patchy bilateral cerebellar infarction. New tiny left thalamic infarcts. 2. Preserved flow void in the stented basilar.  TTE - Left ventricle: The cavity size was normal. There was severe   concentric hypertrophy. Systolic function was normal. The   estimated ejection fraction was in the range of 60% to 65%. Wall   motion was normal; there were no regional wall motion   abnormalities. Doppler parameters are consistent with abnormal   left ventricular relaxation (grade 1 diastolic dysfunction). The   E/e&' ratio is <8, suggesting normal LV filling pressure. - Aortic valve: Trileaflet; mildly calcified leaflets.   Transvalvular velocity was minimally increased. There was no   regurgitation. Mean gradient (S): 12 mm Hg. - Mitral valve: Mildly thickened leaflets . There was trivial   regurgitation. - Left atrium: The atrium was normal in size. - Inferior vena cava: The vessel was dilated. The respirophasic   diameter changes were blunted (< 50%), consistent with elevated   central venous pressure. Impressions: - LVEF 60-65%, severe LVH, normal wall motion, grade 1 DD, normal   LV filling pressure, minimally increased aortic velocity without   signficant stenosis, trivial MR, normal LA size, dilated IVC.  CT abdomen and pelvis 09/03/18 Mild bilateral posterior basilar subsegmental atelectasis. Interval placement of new gastrostomy tube with tip and balloon within gastric lumen. Surgical drain is seen in the soft tissues in previous gastrostomy site. Small amount of gas and stranding is seen in the peritoneal fat anterior and inferior to the stomach which most likely is related to gastrostomy placement. Probable surgical wound seen in right upper quadrant of the anterior abdominal wall. 8.2 x 2.3 cm gas and fluid  collection is seen in the anterior abdominal wall which is unchanged compared to prior exam. Possible abscess cannot be excluded. Enlarged fibroid uterus.  PHYSICAL EXAM  Temp:  [97.6 F (36.4 C)-98.6 F (37 C)] 98.6 F (37 C) (11/26 1200) Pulse Rate:  [69-82] 74 (11/26 1327) Resp:  [14-26] 22 (11/26 1327) BP: (124-143)/(68-76) 134/70 (11/26 1200) SpO2:  [99 %-100 %] 100 % (11/26 1327) FiO2 (%):  [28 %] 28 % (11/26 1200)  General -  Obese middle-aged African-American lady, on trach collar, not in distress. She has abdominal surgical wound and drainage from the abdominal wall, dressing clean  Ophthalmologic - fundi not visualized due to noncooperation.  Cardiovascular - regular rate and rhythm  Neuro - on trach collar, eyes open, awake, alert. No ptosis or EOMI, denies diplopia.  PERRL. Bilateral facial weakness, tongue protrusion weak. Able to wiggle left hand fingers and left foot toes. 2-/5 LLE and 2/5 LUE. Follows commands and good eye contact.  Speech is moderate dysarthric with speaking valve.   Dense right  hemiplegia persists.  ASSESSMENT/PLAN [name] is a 57 y.o. female with history of difficult to control hypertension, insulin-dependent diabetes, obesity, hypothyroidism status post ablation presenting with chest discomfort, right-sided weakness, slurred speech and right facial droop. She did not receive IV t-PA due to late presentation. S/P stent assisted angioplasty of distal basilar artery.  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 Worsening symptoms with extension of pontine/medullary infarcts with new small bilateral cerebellar infarcts without evidence of stent re-stenosis or occlusion  Resultant quadriplegia, on trach and peg  CT head - No acute intracranial abnormalities.  CTA H&N 08/28/2018 - Extensive atherosclerotic disease in the basilar with focal critical stenosis in the mid to distal basilar. No  definite acute thrombus identified. Severe stenosis left posterior cerebral artery  MRI head - Acute nonhemorrhagic infarct involving the left paramedian brainstem.   CTA H&N 08/15/2018 - stent too dense to see BA lumen, but distal flow preserved. New small cerebellar infarcts since brain MRI yesterday.  Severe left P2 segment stenosis, also seen previously.   Repeat MRI - extension of b/l pontine and upper medullary infarcts with new b/l cerebellar infarcts.  2D Echo - EF 60-65%  LDL - 174  HgbA1c - 11.1  VTE prophylaxis - heparin subq  aspirin 81 mg daily prior to admission, now resumed on Brilinta 90 mg bid and ASA 81 mg daily.   Patient counseled to be compliant with her antithrombotic medications  Ongoing aggressive stroke risk factor management  Therapy recommendations:  SNF  Disposition:  SNF  Placement - palliative care service has discussed with pt using speaking valve and clearly pt would like full aggressive care. SW still working on the placement which is very difficult at this time.    Abnormal uterine bleeding, improved  Has been following with GYN  Was on megace in the past  CT abd/pelvis 09/03/18 - enlarged fibroid uterus  continue megace 80mg  bid  Continue ASA and brilinta for now  OBGYN recs appreciated  Gave prmarin 25mg  IV as recommended - vaginal bleeding much improved  Close CBC monitoring  Anemia   Hb 6.8->PRBC->7.1->9.0-7.7->7.0->PRBC->8.6->8.5->8.3->8.7  Likely due to iron deficiency, heavy periods and acute uterine bleeding and recent surgeries  Iron panel showed iron deficiency  On iron solutions  PRBC transfusion 1 U 09/18/2018 and 2U 09/07/18 and 2U 09/21/18  CBC monitoring  Intracranial stenosis  BA mid to distal severe stenosis s/p BA stenting  Left PCA severe stenosis, R PCA moderate stenosis  Left MCA moderate stenosis  Uncontrolled stroke risk factors with HLD, HTN, DM  Non compliance with meds at  home  Respiratory failure  S/p trach 08/27/2018  On trach collar now  Able to speak with speaking valve although dysarthric  Trach secretion decreasing   Discussed with CCM and considering decannulation of the trach  Will follow up with CCM and RT next week   Hypertension  Stable so far  BP goal 130-150   On Coreg 25 bid, amlodipine and hydralazine  Close monitoring  Hyperlipidemia  Lipid lowering medication PTA:  Lipitor 20 mg daily  LDL 174, goal < 70  Increased to 80 mg daily  Continue statin at discharge  Diabetes  HgbA1c 11.4, goal < 7.0  Uncontrolled at home  Glucose stable after insulin adjustment  Decrease Lantus again today to 15U bid   Decrease basal NovoLog again today to 3U Q4h  SSI  CBG monitoring  Low grade fever with leukocytosis,  resolved  Tmax 101.6 -> afebrile  Leukocytosis - 14.0->12.1->9.9->11.2->11.5->11.3->9.5->9.9->9.1->8.5  UA WBC 21-50  CXR unremarkable, improving from prior  Blood cultures negative 10/26   Respiratory cultures - mixed flora   Abdominal abscess culture showed proteus mirabilis  Off antibiotics - completed course  Hypernatremia -> hyponatremia-> resolved  Na 149->148->150->147->144->143->134->133->137->140->138->142  On TF @ 65  on NS @ 20  Continue BMP monitoring  Dysphagia s/p PEG complicated by abdominal wall abscess  Continue tube feeding @ 45 cc/h  PEG done 08/09/2018  Removed and replaced dislodged PEG 08/07/2018  CT showed Abdominal wall abscess - s/p open surgical drainage 08/11/2018 persistent foul odor discharge. Reexploration 09/22/2018  Culture showed peroteus mirabilis  Off meropenum  Trauma surgery following, appreciate recs  Wound debrided appropriately and sutures removed.  No evisceration  Diarrhea, resolved  Likely related to TF  Dietitian has made formula change  Laxatives discontinued  Imodium scheduled dose changed to PRN  Other Stroke Risk Factors  Obesity,  Body mass index is 46.55 kg/m., recommend weight loss, diet and exercise as appropriate   Other Active Problems  Hypokalemia - resolved  Thrombocytosis - likely due to anemia, resolved  Hospital day # 66 Patient and family want full support. SNF options are few and difficult. D/w case Freight forwarder.No change in plan This patient is critically ill and at significant risk of neurological worsening, death and care requires constant monitoring of vital signs, hemodynamics,respiratory and cardiac monitoring, extensive review of multiple databases, frequent neurological assessment, discussion with family, other specialists and medical decision making of high complexity.I have made any additions or clarifications directly to the above note.This critical care time does not reflect procedure time, or teaching time or supervisory time of PA/NP/Med Resident etc but could involve care discussion time.  I spent 30 minutes of neurocritical care time  in the care of  this patient.     Antony Contras, MD Stroke Neurology 09/30/2018 2:31 PM   To contact Stroke Continuity provider, please refer to [email or web address] After hours, contact General Neurology

## 2018-09-30 NOTE — Progress Notes (Signed)
Cleaned inner cannula and suctioned patient.  Patient continues to have thick yellow secretions that easily plug inner cannula.  RT will continue to monitor.

## 2018-09-30 NOTE — Progress Notes (Signed)
Visit made to patients room to administer nebulizer tx and perform trach check.  Cleaned inner cannula and retrieved yellowish plug out of it.  This is necessary to perform several times during a 12 hour shift to maintain patients airway due to her history of plugging off etc.  RT will continue to monitor.

## 2018-09-30 NOTE — Progress Notes (Signed)
  Speech Language Pathology Treatment: Dysphagia  Patient Details Name: Andrea [name] MRN: [id number] DOB: 09/13/1961 Today's Date: 09/30/2018 Time: 7989-2119 SLP Time Calculation (min) (ACUTE ONLY): 12 min  Assessment / Plan / Recommendation Clinical Impression  Pt was seen for skilled ST targeting dysphagia goals.  Pt wearing speaking valve upon therapist's arrival with good toleration noted throughout therapy session.  Upon arrival, pt was talking with NP from palliative medicine team and was visibly frustrated about feeling that she was "being thrown out" of the hospital.  Informed pt that she currently has no SNF offers yet, after which she appeared less anxious and was redirected to functional tasks.  Pt consumed purees and honey thick liquids via teaspoons with no overt s/s of aspiration.  Attempted RMST with EMT device but pt only able to complete 1 repetition successfully despite max multimodal cues.  Pt reported she felt like she didn't have the strength to successfully complete exercises today.  Pt was left in bed with bed alarm set and nursing at bedside. Continue per current plan of care.     HPI HPI: 57 yo presented to ED with chest pain noted Rt hemiparesis in ED with acute Left paramedian brainstem infarct. Intubated 10/10 for angioplasty, extubated post procedure and reintubated. Repeat MRI with brainstem infarct extension and bil cerebellar infarcts. Peg/trach 08/10/2018. FEES 09/15/18, 09/24/18. PMHx: HTN, DM, CKD      SLP Plan  Continue with current plan of care       Recommendations  Diet recommendations: NPO(trials of puree and honey thick liquids via tsp) Liquids provided via: Teaspoon Medication Administration: Via alternative means      Patient may use Passy-Muir Speech Valve: During all therapies with supervision;Intermittently with supervision PMSV Supervision: Intermittent         Oral Care Recommendations: Oral care QID Follow up Recommendations: Skilled  Nursing facility SLP Visit Diagnosis: Dysphagia, oropharyngeal phase (R13.12) Plan: Continue with current plan of care       GO                PageSelinda Orion 09/30/2018, 3:19 PM

## 2018-09-30 NOTE — Plan of Care (Signed)
Pt progressing in care plan/stroke areas but communication/teach back requires time with trach and possible use of Passey Muir valve for better oral communication.

## 2018-09-30 NOTE — Plan of Care (Signed)
Give patient additional time to take in material on their reading level and to respond due to trach issues.

## 2018-09-30 NOTE — Social Work (Addendum)
CSW still searching for SNF for pt. Have yet to hear back definitive placement offer from [facility]. [place] has declined to accept pt under LOG. Both [place] and [facility] have declined to accept pt under LOG as well.  Westley Hummer, MSW, [place] Work [phone]

## 2018-10-01 LAB — CBC
HCT: 30.8 % — ABNORMAL LOW (ref 36.0–46.0)
HEMOGLOBIN: 9 g/dL — AB (ref 12.0–15.0)
MCH: 25.8 pg — AB (ref 26.0–34.0)
MCHC: 29.2 g/dL — ABNORMAL LOW (ref 30.0–36.0)
MCV: 88.3 fL (ref 80.0–100.0)
PLATELETS: 369 10*3/uL (ref 150–400)
RBC: 3.49 MIL/uL — AB (ref 3.87–5.11)
RDW: 17.9 % — ABNORMAL HIGH (ref 11.5–15.5)
WBC: 11.9 10*3/uL — AB (ref 4.0–10.5)
nRBC: 0 % (ref 0.0–0.2)

## 2018-10-01 LAB — GLUCOSE, CAPILLARY
GLUCOSE-CAPILLARY: 146 mg/dL — AB (ref 70–99)
GLUCOSE-CAPILLARY: 136 mg/dL — AB (ref 70–99)
GLUCOSE-CAPILLARY: 179 mg/dL — AB (ref 70–99)
GLUCOSE-CAPILLARY: 151 mg/dL — AB (ref 70–99)
GLUCOSE-CAPILLARY: 152 mg/dL — AB (ref 70–99)

## 2018-10-01 LAB — BASIC METABOLIC PANEL
ANION GAP: 8 (ref 5–15)
BUN: 27 mg/dL — ABNORMAL HIGH (ref 6–20)
CALCIUM: 10.7 mg/dL — AB (ref 8.9–10.3)
CO2: 23 mmol/L (ref 22–32)
Chloride: 110 mmol/L (ref 98–111)
Creatinine, Ser: 0.74 mg/dL (ref 0.44–1.00)
GLUCOSE: 187 mg/dL — AB (ref 70–99)
POTASSIUM: 4.1 mmol/L (ref 3.5–5.1)
Sodium: 141 mmol/L (ref 135–145)

## 2018-10-01 MED ORDER — IPRATROPIUM-ALBUTEROL 0.5-2.5 (3) MG/3ML IN SOLN
3.0000 mL | Freq: Two times a day (BID) | RESPIRATORY_TRACT | Status: DC
  Administered 2018-10-01 – 2018-10-10 (×18): 3 mL via RESPIRATORY_TRACT
  Filled 2018-10-01 (×18): qty 3

## 2018-10-01 NOTE — Progress Notes (Signed)
STROKE TEAM PROGRESS NOTE   SUBJECTIVE (INTERVAL HISTORY) No family is at bedside. .She has  not had any nursing home offers yet and Andrea [name] has also declined..She continues to have thick yellow secretions requiring frequent suctioning and plugging of inner canula often OBJECTIVE Vitals:   10/01/18 0903 10/01/18 1100 10/01/18 1146 10/01/18 1542  BP:   140/67 (!) 151/83  Pulse:   80 77  Resp:   19 16  Temp:   98.1 F (36.7 C) 99 F (37.2 C)  TempSrc:   Oral Axillary  SpO2: 99% 98% 96% 99%  Weight:      Height:        CBC:  Recent Labs  Lab 09/29/18 0726 10/01/18 0655  WBC 11.9* 11.9*  HGB 9.2* 9.0*  HCT 31.0* 30.8*  MCV 87.1 88.3  PLT 368 825    Basic Metabolic Panel:  Recent Labs  Lab 09/29/18 0901 10/01/18 0655  NA 143 141  K 4.1 4.1  CL 114* 110  CO2 24 23  GLUCOSE 152* 187*  BUN 26* 27*  CREATININE 0.74 0.74  CALCIUM 10.6* 10.7*    Lipid Panel:     Component Value Date/Time   CHOL 256 (H) 08/15/2018 0512   TRIG 248 (H) 08/15/2018 0512   HDL 35 (L) 08/15/2018 0512   CHOLHDL 7.3 08/15/2018 0512   VLDL 50 (H) 08/15/2018 0512   LDLCALC 171 (H) 08/15/2018 0512   HgbA1c:  Lab Results  Component Value Date   HGBA1C 11.4 (H) 08/15/2018   Urine Drug Screen:     Component Value Date/Time   LABOPIA NONE DETECTED 08/07/2018 1836   COCAINSCRNUR NONE DETECTED 08/18/2018 1836   LABBENZ NONE DETECTED 08/25/2018 1836   AMPHETMU NONE DETECTED 08/15/2018 1836   THCU NONE DETECTED 08/31/2018 1836   LABBARB NONE DETECTED 08/18/2018 1836    Alcohol Level No results found for: ETH  IMAGING  Ct Angio Head W Or Wo Contrast Ct Angio Neck W Or Wo Contrast 08/15/2018 IMPRESSION:  1. Interval basilar to left proximal PCA stenting. The density of the stent walls precludes detection of in stent stenosis; there is a degree of wasting at the mid basilar segment. There is flow in the left PCA beyond the stent such that there is presumed stent patency.  2. Known  lower pontine infarct. There are new small cerebellar infarcts since brain MRI yesterday.  3. Severe left P2 segment stenosis, also seen previously.  4. Moderate atheromatous narrowing in the proximal left MCA.    Ct Angio Head W Or Wo Contrast Ct Angio Neck W Or Wo Contrast 08/13/2018 IMPRESSION:  1. 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 No definite acute thrombus identified.  2. Severe stenosis left posterior cerebral artery and moderate stenosis right posterior cerebral artery.  3. Mild stenosis left MCA and moderate stenosis left MCA bifurcation.  4. No significant carotid or vertebral artery stenosis in the neck.    Ct Head Wo Contrast 08/24/2018 IMPRESSION:  No acute intracranial abnormalities. Mild white matter changes likely due to small vessel ischemia.    Mr Brain Wo Contrast 08/06/2018 IMPRESSION:  1. Acute nonhemorrhagic infarct involving the left paramedian brainstem. The infarct crosses midline.  2. Other periventricular and subcortical white matter disease is moderately advanced for age. This likely reflects the sequela of chronic microvascular ischemia.  3. Tapering of the dens with prominent soft tissue pannus. 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  Ct Head Code Stroke Wo Contrast 08/11/2018  IMPRESSION:  1. No acute abnormality and no change from earlier today  2. ASPECTS is 10 3.    Cerebral Angiogram 08/27/2018 S/P 4 vessel cerebral arteriogram RT CFA approach. 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 S/P stent assisted angioplasty of distal basilar artery with patency of 80 to 90 %. 2.Approx 70 % stenosis of LT ICA supraclinoid seg   MRI 08/15/18 1. Progressive acute pontine infarct. New patchy bilateral cerebellar infarction. New tiny left thalamic infarcts. 2.  Preserved flow void in the stented basilar. 3. left facial/submandibular edema, please correlate with neck exam. Edited result: IMPRESSION: 1. Progressive acute pontine infarct. New patchy bilateral cerebellar infarction. New tiny left thalamic infarcts. 2. Preserved flow void in the stented basilar.  TTE - Left ventricle: The cavity size was normal. There was severe   concentric hypertrophy. Systolic function was normal. The   estimated ejection fraction was in the range of 60% to 65%. Wall   motion was normal; there were no regional wall motion   abnormalities. Doppler parameters are consistent with abnormal   left ventricular relaxation (grade 1 diastolic dysfunction). The   E/e&' ratio is <8, suggesting normal LV filling pressure. - Aortic valve: Trileaflet; mildly calcified leaflets.   Transvalvular velocity was minimally increased. There was no   regurgitation. Mean gradient (S): 12 mm Hg. - Mitral valve: Mildly thickened leaflets . There was trivial   regurgitation. - Left atrium: The atrium was normal in size. - Inferior vena cava: The vessel was dilated. The respirophasic   diameter changes were blunted (< 50%), consistent with elevated   central venous pressure. Impressions: - LVEF 60-65%, severe LVH, normal wall motion, grade 1 DD, normal   LV filling pressure, minimally increased aortic velocity without   signficant stenosis, trivial MR, normal LA size, dilated IVC.  CT abdomen and pelvis 09/03/18 Mild bilateral posterior basilar subsegmental atelectasis. Interval placement of new gastrostomy tube with tip and balloon within gastric lumen. Surgical drain is seen in the soft tissues in previous gastrostomy site. Small amount of gas and stranding is seen in the peritoneal fat anterior and inferior to the stomach which most likely is related to gastrostomy placement. Probable surgical wound seen in right upper quadrant of the anterior abdominal wall. 8.2 x 2.3 cm gas  and fluid collection is seen in the anterior abdominal wall which is unchanged compared to prior exam. Possible abscess cannot be excluded. Enlarged fibroid uterus.  PHYSICAL EXAM  Temp:  [97.7 F (36.5 C)-99 F (37.2 C)] 99 F (37.2 C) (11/27 1542) Pulse Rate:  [74-82] 77 (11/27 1542) Resp:  [14-20] 16 (11/27 1542) BP: (137-151)/(67-83) 151/83 (11/27 1542) SpO2:  [96 %-100 %] 99 % (11/27 1542) FiO2 (%):  [21 %-28 %] 21 % (11/27 1100)  General -  Obese middle-aged African-American lady, on trach collar, not in distress. She has abdominal surgical wound and drainage from the abdominal wall, dressing clean  Ophthalmologic - fundi not visualized due to noncooperation.  Cardiovascular - regular rate and rhythm  Neuro - on trach collar, eyes open, awake, alert. No ptosis or EOMI, denies diplopia.  PERRL. Bilateral facial weakness, tongue protrusion weak. Able to wiggle left hand fingers and left foot toes. 2-/5 LLE and 2/5 LUE. Follows commands and good eye contact.  Speech is moderate dysarthric with speaking valve.   Dense  right hemiplegia persists.  ASSESSMENT/PLAN [name] is a 57 y.o. female with history of difficult to control hypertension, insulin-dependent diabetes, obesity, hypothyroidism status post ablation presenting with chest discomfort, right-sided weakness, slurred speech and right facial droop. She did not receive IV t-PA due to late presentation. S/P stent assisted angioplasty of distal basilar artery.  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 Worsening symptoms with extension of pontine/medullary infarcts with new small bilateral cerebellar infarcts without evidence of stent re-stenosis or occlusion  Resultant quadriplegia, on trach and peg  CT head - No acute intracranial abnormalities.  CTA H&N 08/22/2018 - Extensive atherosclerotic disease in the basilar with focal critical stenosis in the mid to distal  basilar. No definite acute thrombus identified. Severe stenosis left posterior cerebral artery  MRI head - Acute nonhemorrhagic infarct involving the left paramedian brainstem.   CTA H&N 08/15/2018 - stent too dense to see BA lumen, but distal flow preserved. New small cerebellar infarcts since brain MRI yesterday.  Severe left P2 segment stenosis, also seen previously.   Repeat MRI - extension of b/l pontine and upper medullary infarcts with new b/l cerebellar infarcts.  2D Echo - EF 60-65%  LDL - 174  HgbA1c - 11.1  VTE prophylaxis - heparin subq  aspirin 81 mg daily prior to admission, now resumed on Brilinta 90 mg bid and ASA 81 mg daily.   Patient counseled to be compliant with her antithrombotic medications  Ongoing aggressive stroke risk factor management  Therapy recommendations:  SNF  Disposition:  SNF  Placement - palliative care service has discussed with pt using speaking valve and clearly pt would like full aggressive care. SW still working on the placement which is very difficult at this time.    Abnormal uterine bleeding, improved  Has been following with GYN  Was on megace in the past  CT abd/pelvis 09/03/18 - enlarged fibroid uterus  continue megace 80mg  bid  Continue ASA and brilinta for now  OBGYN recs appreciated  Gave prmarin 25mg  IV as recommended - vaginal bleeding much improved  Close CBC monitoring  Anemia   Hb 6.8->PRBC->7.1->9.0-7.7->7.0->PRBC->8.6->8.5->8.3->8.7  Likely due to iron deficiency, heavy periods and acute uterine bleeding and recent surgeries  Iron panel showed iron deficiency  On iron solutions  PRBC transfusion 1 U 09/16/2018 and 2U 09/07/18 and 2U 09/21/18  CBC monitoring  Intracranial stenosis  BA mid to distal severe stenosis s/p BA stenting  Left PCA severe stenosis, R PCA moderate stenosis  Left MCA moderate stenosis  Uncontrolled stroke risk factors with HLD, HTN, DM  Non compliance with meds at  home  Respiratory failure  S/p trach 08/29/2018  On trach collar now  Able to speak with speaking valve although dysarthric  Trach secretion decreasing      Hypertension  Stable so far  BP goal 130-150   On Coreg 25 bid, amlodipine and hydralazine  Close monitoring  Hyperlipidemia  Lipid lowering medication PTA:  Lipitor 20 mg daily  LDL 174, goal < 70  Increased to 80 mg daily  Continue statin at discharge  Diabetes  HgbA1c 11.4, goal < 7.0  Uncontrolled at home  Glucose stable after insulin adjustment  Decrease Lantus again today to 15U bid   Decrease basal NovoLog again today to 3U Q4h  SSI  CBG monitoring  Low grade fever with leukocytosis, resolved  Tmax 101.6 -> afebrile  Leukocytosis - 14.0->12.1->9.9->11.2->11.5->11.3->9.5->9.9->9.1->8.5  UA WBC 21-50  CXR unremarkable,  improving from prior  Blood cultures negative 10/26   Respiratory cultures - mixed flora   Abdominal abscess culture showed proteus mirabilis  Off antibiotics - completed course  Hypernatremia -> hyponatremia-> resolved  Na 149->148->150->147->144->143->134->133->137->140->138->142  On TF @ 65  on NS @ 20  Continue BMP monitoring  Dysphagia s/p PEG complicated by abdominal wall abscess  Continue tube feeding @ 45 cc/h  PEG done 08/14/2018  Removed and replaced dislodged PEG 08/31/2018  CT showed Abdominal wall abscess - s/p open surgical drainage 08/07/2018 persistent foul odor discharge. Reexploration 09/30/2018  Culture showed peroteus mirabilis  Off meropenum  Trauma surgery following, appreciate recs  Wound debrided appropriately and sutures removed.  No evisceration  Diarrhea, resolved  Likely related to TF  Dietitian has made formula change  Laxatives discontinued  Imodium scheduled dose changed to PRN  Other Stroke Risk Factors  Obesity, Body mass index is 46.55 kg/m., recommend weight loss, diet and exercise as appropriate   Other  Active Problems  Hypokalemia - resolved  Thrombocytosis - likely due to anemia, resolved  Hospital day # 48 Patient and family want full support. SNF options are few and difficult. D/w case Freight forwarder.No change in plan.no nursing home beds available here at. Will continue looking This patient is critically ill and at significant risk of neurological worsening, death and care requires constant monitoring of vital signs, hemodynamics,respiratory and cardiac monitoring, extensive review of multiple databases, frequent neurological assessment, discussion with family, other specialists and medical decision making of high complexity.I have made any additions or clarifications directly to the above note.This critical care time does not reflect procedure time, or teaching time or supervisory time of PA/NP/Med Resident etc but could involve care discussion time.  I spent 30 minutes of neurocritical care time  in the care of  this patient.     Antony Contras, MD Stroke Neurology 10/01/2018 4:01 PM   To contact Stroke Continuity provider, please refer to [email or web address] After hours, contact General Neurology

## 2018-10-01 NOTE — Plan of Care (Signed)
  Problem: Education: Goal: Knowledge of General Education information will improve Description Including pain rating scale, medication(s)/side effects and non-pharmacologic comfort measures Outcome: Progressing   Problem: Health Behavior/Discharge Planning: Goal: Ability to manage health-related needs will improve Outcome: Progressing   Problem: Clinical Measurements: Goal: Ability to maintain clinical measurements within normal limits will improve Outcome: Progressing Goal: Will remain free from infection Outcome: Progressing Goal: Diagnostic test results will improve Outcome: Progressing Goal: Respiratory complications will improve Outcome: Progressing Goal: Cardiovascular complication will be avoided Outcome: Progressing   Problem: Activity: Goal: Risk for activity intolerance will decrease Outcome: Progressing   Problem: Nutrition: Goal: Adequate nutrition will be maintained Outcome: Progressing   Problem: Coping: Goal: Level of anxiety will decrease Outcome: Progressing   Problem: Elimination: Goal: Will not experience complications related to bowel motility Outcome: Progressing Goal: Will not experience complications related to urinary retention Outcome: Progressing   Problem: Pain Managment: Goal: General experience of comfort will improve Outcome: Progressing   Problem: Safety: Goal: Ability to remain free from injury will improve Outcome: Progressing   Problem: Skin Integrity: Goal: Risk for impaired skin integrity will decrease Outcome: Progressing   Problem: Education: Goal: Understanding of cardiac disease, CV risk reduction, and recovery process will improve Outcome: Progressing Goal: Individualized Educational Video(s) Outcome: Progressing   Problem: Activity: Goal: Ability to tolerate increased activity will improve Outcome: Progressing   Problem: Cardiac: Goal: Ability to achieve and maintain adequate cardiovascular perfusion will  improve Outcome: Progressing   Problem: Health Behavior/Discharge Planning: Goal: Ability to safely manage health-related needs after discharge will improve Outcome: Progressing   Problem: Education: Goal: Knowledge of disease or condition will improve Outcome: Progressing Goal: Knowledge of secondary prevention will improve Outcome: Progressing Goal: Knowledge of patient specific risk factors addressed and post discharge goals established will improve Outcome: Progressing Goal: Individualized Educational Video(s) Outcome: Progressing   Problem: Coping: Goal: Will verbalize positive feelings about self Outcome: Progressing Goal: Will identify appropriate support needs Outcome: Progressing   Problem: Health Behavior/Discharge Planning: Goal: Ability to manage health-related needs will improve Outcome: Progressing   Problem: Self-Care: Goal: Ability to participate in self-care as condition permits will improve Outcome: Progressing Goal: Verbalization of feelings and concerns over difficulty with self-care will improve Outcome: Progressing Goal: Ability to communicate needs accurately will improve Outcome: Progressing   Problem: Nutrition: Goal: Risk of aspiration will decrease Outcome: Progressing Goal: Dietary intake will improve Outcome: Progressing   Problem: Ischemic Stroke/TIA Tissue Perfusion: Goal: Complications of ischemic stroke/TIA will be minimized Outcome: Progressing   Problem: Spiritual Needs Goal: Ability to function at adequate level Outcome: Progressing

## 2018-10-01 NOTE — Progress Notes (Signed)
Physical Therapy Treatment Patient Details Name: Andrea [name] MRN: [id number] DOB: May 02, 1961 Today's Date: 10/01/2018    History of Present Illness 57 yo presented to ED with chest pain noted Rt hemiparesis in ED with acute Left paramedian brainstem infarct. Intubated 10/10 for angioplasty, extubated post procedure and reintubated. Repeat MRI with brainstem infarct extension and bil cerebellar infarcts. Peg/trach with return to vent 08/10/2018. 10/22-29 on trach collar, return to vent 10/29. 10/28 I & D of abdominal wall abscess with placement of penrose drain and G-tube, 11/1 repeat ex lap. 11/7 return to trach collar.  PMHx: HTN, DM, CKD    PT Comments    Pt was able to sit EOB today and participate for quite a long time in EOB activities.  She is initiating some weak trunk mostly at her head and upper back to assist in sitting balance.  She was able to kick an object away from her with her left leg.  She was not agreeable for OOB to [facility] today and became tearful talking about it stating that her LEs really were sore and she didn't want to get up today, promising she would tomorrow.  Goal update preformed.  PT will continue to follow acutely for safe mobility progression  Follow Up Recommendations  SNF;Supervision/Assistance - 24 hour     Equipment Recommendations  Hospital bed;Wheelchair (measurements PT);Other (comment)(hoyer lift)    Recommendations for Other Services  NA     Precautions / Restrictions Precautions Precautions: Fall Precaution Comments: Peg/trach, abdominal binder     Mobility  Bed Mobility Overal bed mobility: Needs Assistance Bed Mobility: Supine to Sit;Sit to Supine Rolling: Total assist;+2 for physical assistance   Supine to sit: Total assist;+2 for physical assistance     General bed mobility comments: Total assist to support trunk and progress bil LEs to EOB.    Transfers                 General transfer comment: Pt declined tranfer OOB to  wc via lift today, tearful and explaining that she just did not want to do it her leg hurt.          Balance Overall balance assessment: Needs assistance Sitting-balance support: Feet supported;Bilateral upper extremity supported Sitting balance-Leahy Scale: Poor Sitting balance - Comments: increased ability to incorporate trunk in movemetns while seated, especially upper trunk and head/neck initiation to move the rest of her trunk.  Spent almost 20 mins EOB working on weight shifting, rotation, kicking and sitting balance. Fluctuating assist from mod to max assist.                                      Cognition Arousal/Alertness: Awake/alert Behavior During Therapy: Flat affect(tearful at times) Overall Cognitive Status: Impaired/Different from baseline Area of Impairment: Awareness;Attention;Safety/judgement                           Awareness: Emergent Problem Solving: Slow processing General Comments: Tearful at times; improved affect once involved with therapy, music helpful      Exercises General Exercises - Upper Extremity Shoulder Flexion: AAROM;PROM;Left;10 reps;Supine Shoulder ABduction: AROM;AAROM;Left;10 reps;Seated Shoulder ADduction: AROM;AAROM;Left;10 reps;Supine;Seated Elbow Flexion: AROM;AAROM;Left;10 reps;Seated;Supine Elbow Extension: PROM;AROM;AAROM;Both;15 reps;Seated Wrist Flexion: Right;Left;AROM;PROM;AAROM;Strengthening;15 reps;Seated Wrist Extension: PROM;AROM;AAROM;Both;15 reps;Seated Digit Composite Flexion: PROM;AROM;AAROM;Both;15 reps;Seated Composite Extension: PROM;AROM;AAROM;Both;15 reps;Seated Other Exercises Other Exercises: weight bearing through L forearm EOB to facilitate pushing up  to midline Other Exercises: crossing midline to facilitae trunk rotation, flexion and extension    General Comments General comments (skin integrity, edema, etc.): PMV on entire session, VSS      Pertinent Vitals/Pain Pain  Assessment: Faces Faces Pain Scale: Hurts even more Pain Location: grimacing with ROM with extremities Pain Descriptors / Indicators: Discomfort;Grimacing Pain Intervention(s): Limited activity within patient's tolerance;Monitored during session;Repositioned           PT Goals (current goals can now be found in the care plan section) Acute Rehab PT Goals Patient Stated Goal: to get stronger PT Goal Formulation: With patient Time For Goal Achievement: 10/15/18 Potential to Achieve Goals: Fair Progress towards PT goals: Progressing toward goals    Frequency    Min 2X/week      PT Plan Current plan remains appropriate    Co-evaluation PT/OT/SLP Co-Evaluation/Treatment: Yes Reason for Co-Treatment: Necessary to address cognition/behavior during functional activity;Complexity of the patient's impairments (multi-system involvement);For patient/therapist safety;To address functional/ADL transfers PT goals addressed during session: Mobility/safety with mobility;Balance;Proper use of DME;Strengthening/ROM OT goals addressed during session: ADL's and self-care;Strengthening/ROM      AM-PAC PT "6 Clicks" Mobility   Outcome Measure  Help needed turning from your back to your side while in a flat bed without using bedrails?: Total Help needed moving from lying on your back to sitting on the side of a flat bed without using bedrails?: Total Help needed moving to and from a bed to a chair (including a wheelchair)?: Total Help needed standing up from a chair using your arms (e.g., wheelchair or bedside chair)?: Total Help needed to walk in hospital room?: Total Help needed climbing 3-5 steps with a railing? : Total 6 Click Score: 6    End of Session Equipment Utilized During Treatment: Oxygen;Other (comment)(trach collar, PMV) Activity Tolerance: Patient tolerated treatment well Patient left: in bed;with call bell/phone within reach(with bed in chair position)   PT Visit Diagnosis:  Other abnormalities of gait and mobility (R26.89);Muscle weakness (generalized) (M62.81);Other symptoms and signs involving the nervous system (R29.898);Hemiplegia and hemiparesis Hemiplegia - Right/Left: Right Hemiplegia - dominant/non-dominant: Dominant Hemiplegia - caused by: Cerebral infarction     Time: 1446-1540 PT Time Calculation (min) (ACUTE ONLY): 54 min  Charges:  $Therapeutic Activity: 23-37 mins          Forrest Demuro B. Haily Caley, PT, DPT  Acute Rehabilitation [phone] pager #[phone] office            10/01/2018, 5:30 PM

## 2018-10-01 NOTE — Social Work (Signed)
Received update from [facility] who has declined pt under LOG.  Continue to search for placement for pt.  Westley Hummer, MSW, [place] Work [phone]

## 2018-10-01 NOTE — Progress Notes (Signed)
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 Please page [place] with any concerns.    LOS: 48 days    Subjective: CC: abdominal wound  No complaints today.   Objective: Vital signs in last 24 hours: Temp:  [97.7 F (36.5 C)-98.6 F (37 C)] 98.1 F (36.7 C) (11/27 0741) Pulse Rate:  [71-82] 82 (11/27 0857) Resp:  [14-26] 20 (11/27 0857) BP: (134-148)/(70-79) 140/76 (11/27 0741) SpO2:  [99 %-100 %] 99 % (11/27 0903) FiO2 (%):  [21 %-28 %] 21 % (11/27 0903) Last BM Date: 09/30/18  Intake/Output from previous day: 11/26 0701 - 11/27 0700 In: 955 [NG/GT:955] Out: 950 [Urine:950] Intake/Output this shift: No intake/output data recorded.  PE: Gen:  Alert, NAD, pleasant, cooperative Abd: Soft, ND, PEG in place and site looks clean, see below photos of wounds. Both wounds are without purulent drainage and appear well healing. Wound repacked Skin: warm and dry  Right sided wound  Left sided wound     Anti-infectives: Anti-infectives (From admission, onward)   Start     Dose/Rate Route Frequency Ordered Stop   09/08/18 1400  meropenem (MERREM) 1 g in sodium chloride 0.9 % 100 mL IVPB     1 g 200 mL/hr over 30 Minutes Intravenous Every 8 hours 09/08/18 1311 09/13/18 2241   09/04/18 1600  cefTRIAXone (ROCEPHIN) 2 g in sodium chloride 0.9 % 100 mL IVPB  Status:  Discontinued     2 g 200 mL/hr over 30 Minutes Intravenous Every 24 hours  09/04/18 1437 09/08/18 1311   09/04/18 1600  metroNIDAZOLE (FLAGYL) IVPB 500 mg  Status:  Discontinued     500 mg 100 mL/hr over 60 Minutes Intravenous Every 8 hours 09/04/18 1437 09/08/18 1311   09/02/18 0600  vancomycin (VANCOCIN) 500 mg in sodium chloride 0.9 % 100 mL IVPB  Status:  Discontinued     500 mg 100 mL/hr over 60 Minutes Intravenous Every 12 hours 08/14/2018 1619 09/03/18 1025   08/30/18 1600  vancomycin (VANCOCIN) IVPB 750 mg/150 ml premix  Status:  Discontinued     750 mg 150 mL/hr over 60 Minutes Intravenous Every 12 hours 08/30/18 1334 08/19/2018 1619   08/29/18 2300  vancomycin (VANCOCIN) IVPB 750 mg/150 ml premix  Status:  Discontinued     750 mg 150 mL/hr over 60 Minutes Intravenous Every 12 hours 08/29/18 1223 08/29/18 1327   08/29/18 2300  vancomycin (VANCOCIN) IVPB 1000 mg/200 mL premix  Status:  Discontinued     1,000 mg 200 mL/hr over 60 Minutes Intravenous Every 12 hours 08/29/18 1327 08/30/18 1139   08/29/18 1800  cefTAZidime (FORTAZ) 2 g in sodium chloride 0.9 % 100 mL IVPB  Status:  Discontinued     2 g 200 mL/hr over 30 Minutes Intravenous Every 8 hours 08/29/18 1228 09/04/18 1437   08/26/18 2300  vancomycin (VANCOCIN) IVPB 1000 mg/200 mL premix  Status:  Discontinued     1,000 mg 200 mL/hr over 60 Minutes Intravenous Every 12 hours  08/26/18 0923 08/29/18 1223   08/26/18 1000  vancomycin (VANCOCIN) 2,500 mg in sodium chloride 0.9 % 500 mL IVPB     2,500 mg 250 mL/hr over 120 Minutes Intravenous  Once 08/26/18 0923 08/26/18 1900   08/26/18 1000  cefTAZidime (FORTAZ) 1 g in sodium chloride 0.9 % 100 mL IVPB  Status:  Discontinued     1 g 200 mL/hr over 30 Minutes Intravenous Every 8 hours 08/26/18 0923 08/29/18 1228   08/25/2018 1014  ceFAZolin (ANCEF) 2-4 GM/100ML-% IVPB    Note to Pharmacy:  Roma Kayser  : cabinet override      08/28/2018 1014 08/17/2018 2229      Lab Results:  Recent Labs    09/29/18 0726 10/01/18 0655  WBC 11.9* 11.9*  HGB 9.2*  9.0*  HCT 31.0* 30.8*  PLT 368 369   BMET Recent Labs    09/29/18 0901 10/01/18 0655  NA 143 141  K 4.1 4.1  CL 114* 110  CO2 24 23  GLUCOSE 152* 187*  BUN 26* 27*  CREATININE 0.74 0.74  CALCIUM 10.6* 10.7*   PT/INR No results for input(s): LABPROT, INR in the last 72 hours. CMP     Component Value Date/Time   NA 141 10/01/2018 0655   K 4.1 10/01/2018 0655   CL 110 10/01/2018 0655   CO2 23 10/01/2018 0655   GLUCOSE 187 (H) 10/01/2018 0655   BUN 27 (H) 10/01/2018 0655   CREATININE 0.74 10/01/2018 0655   CREATININE 0.91 04/09/2016 1213   CALCIUM 10.7 (H) 10/01/2018 0655   PROT 7.8 01/23/2018 1352   ALBUMIN 1.9 (L) 09/20/2018 0323   AST 14 (L) 01/23/2018 1352   ALT 11 (L) 01/23/2018 1352   ALKPHOS 92 01/23/2018 1352   BILITOT 0.8 01/23/2018 1352   GFRNONAA >60 10/01/2018 0655   GFRNONAA 71 04/09/2016 1213   GFRAA >60 10/01/2018 0655   GFRAA 82 04/09/2016 1213   Lipase     Component Value Date/Time   LIPASE 41 01/23/2018 1352    Studies/Results: No results found.    Kalman Drape , [facility] 10/01/2018, 10:26 AM  Pager: [phone] Mon-Wed, Friday 7:00am-4:30pm Thurs 7am-11:30am  Consults: [phone]

## 2018-10-01 NOTE — Progress Notes (Signed)
Occupational Therapy Treatment Patient Details Name: Andrea [name] MRN: [id number] DOB: 12-13-1960 Today's Date: 10/01/2018    History of present illness 57 yo presented to ED with chest pain noted Rt hemiparesis in ED with acute Left paramedian brainstem infarct. Intubated 10/10 for angioplasty, extubated post procedure and reintubated. Repeat MRI with brainstem infarct extension and bil cerebellar infarcts. Peg/trach with return to vent 08/14/2018. 10/22-29 on trach collar, return to vent 10/29. 10/28 I & D of abdominal wall abscess with placement of penrose drain and G-tube, 11/1 repeat ex lap. 11/7 return to trach collar.  PMHx: HTN, DM, CKD   OT comments  Pt tearful at beginning of session not wanting to get OOB to wc. Focus of session on facilitating trunk control sitting EOB during co-treat with PT. Music incorporated into session. Pt continues to make slow steady progress, however continues to require total care with ADL and +2 total A with bed mobility. Encourage staff to mobilize pt OOB to wc with Maximove daily. Encourage pt to utilize lift bed to stand pt at least TID. Will continue to follow acutely.   Follow Up Recommendations  SNF;Supervision/Assistance - 24 hour    Equipment Recommendations  Other (comment)    Recommendations for Other Services      Precautions / Restrictions Precautions Precautions: Fall Precaution Comments: Peg/trach, abdominal binder        Mobility Bed Mobility Overal bed mobility: Needs Assistance Bed Mobility: Supine to Sit;Sit to Supine Rolling: Total assist;+2 for physical assistance   Supine to sit: Total assist;+2 for physical assistance        Transfers                 General transfer comment: Pt declined tranfer OOB to wc    Balance     Sitting balance-Leahy Scale: Poor Sitting balance - Comments: increased ability to incorporate trunk in movements while seated; using head movemtn to guide trunk movement; increased  ability to use LUE to "prop" when sitting                                   ADL either performed or assessed with clinical judgement   ADL Overall ADL's : Needs assistance/impaired   Eating/Feeding Details (indicate cue type and reason): ice chips with PMSV Grooming: Maximal assistance Grooming Details (indicate cue type and reason): Hand over hand to wash face with max assist                                     Vision       Perception     Praxis      Cognition Arousal/Alertness: Awake/alert Behavior During Therapy: Flat affect(tearful at times) Overall Cognitive Status: Impaired/Different from baseline Area of Impairment: Awareness;Attention;Safety/judgement                           Awareness: Emergent Problem Solving: Slow processing General Comments: Tearful at times; improved affect once involved with therapy        Exercises General Exercises - Upper Extremity Shoulder Flexion: AAROM;PROM;Left;10 reps;Supine Shoulder ABduction: AROM;AAROM;Left;10 reps;Seated Shoulder ADduction: AROM;AAROM;Left;10 reps;Supine;Seated Elbow Flexion: AROM;AAROM;Left;10 reps;Seated;Supine Elbow Extension: PROM;AROM;AAROM;Both;15 reps;Seated Wrist Flexion: Right;Left;AROM;PROM;AAROM;Strengthening;15 reps;Seated Wrist Extension: PROM;AROM;AAROM;Both;15 reps;Seated Digit Composite Flexion: PROM;AROM;AAROM;Both;15 reps;Seated Composite Extension: PROM;AROM;AAROM;Both;15 reps;Seated Other Exercises Other Exercises: weight bearing through L  forearm EOB to facilitate pushing up to midline Other Exercises: crossing midline to facilitae trunk rotation, flexion and extension   Shoulder Instructions       General Comments      Pertinent Vitals/ Pain       Pain Assessment: Faces Faces Pain Scale: Hurts even more Pain Location: grimacing with ROM with extremities Pain Descriptors / Indicators: Discomfort;Grimacing Pain Intervention(s): Limited  activity within patient's tolerance  Home Living                                          Prior Functioning/Environment              Frequency  Min 2X/week        Progress Toward Goals  OT Goals(current goals can now be found in the care plan section)  Progress towards OT goals: Progressing toward goals;OT to reassess next treatment  Acute Rehab OT Goals Patient Stated Goal: to get stronger OT Goal Formulation: With patient/family Time For Goal Achievement: 10/06/18 Potential to Achieve Goals: Fair ADL Goals Pt Will Perform Grooming: with max assist;bed level Pt Will Perform Upper Body Bathing: with max assist;bed level Pt/caregiver will Perform Home Exercise Program: Increased ROM;With written HEP provided;Both right and left upper extremity Additional ADL Goal #1: Pt will sit EOB with +2 Max A x 10 min in preparation for functional activities. Additional ADL Goal #3: Pt will tolerate 2 hrs out of bed in whellchair to increase activity tolerance. Additional ADL Goal #4: Pt will dmeonstrate to pick up washcloth with L hand to increase functional use L UE for ADL  Plan Discharge plan remains appropriate    Co-evaluation    PT/OT/SLP Co-Evaluation/Treatment: Yes Reason for Co-Treatment: Complexity of the patient's impairments (multi-system involvement);For patient/therapist safety   OT goals addressed during session: ADL's and self-care;Strengthening/ROM      AM-PAC OT "6 Clicks" Daily Activity     Outcome Measure   Help from another person eating meals?: Total Help from another person taking care of personal grooming?: Total Help from another person toileting, which includes using toliet, bedpan, or urinal?: Total Help from another person bathing (including washing, rinsing, drying)?: Total Help from another person to put on and taking off regular upper body clothing?: Total Help from another person to put on and taking off regular lower body  clothing?: Total 6 Click Score: 6    End of Session Equipment Utilized During Treatment: Oxygen  OT Visit Diagnosis: Other abnormalities of gait and mobility (R26.89);Muscle weakness (generalized) (M62.81);Low vision, both eyes (H54.2);Feeding difficulties (R63.3);Other symptoms and signs involving cognitive function;Hemiplegia and hemiparesis;Pain Hemiplegia - Right/Left: Right Hemiplegia - dominant/non-dominant: Non-Dominant Hemiplegia - caused by: Nontraumatic intracerebral hemorrhage;Cerebral infarction   Activity Tolerance Patient tolerated treatment well   Patient Left in bed;with call bell/phone within reach   Nurse Communication Mobility status        Time: 1447-1530 OT Time Calculation (min): 43 min  Charges: OT General Charges $OT Visit: 1 Visit OT Treatments $Self Care/Home Management : 8-22 mins $Therapeutic Activity: 8-22 mins  Maurie Boettcher, OT/L   Acute OT Clinical Specialist Acute Rehabilitation Services Pager [phone] Office [phone]    [facility] 10/01/2018, 4:45 PM

## 2018-10-02 LAB — GLUCOSE, CAPILLARY
GLUCOSE-CAPILLARY: 143 mg/dL — AB (ref 70–99)
GLUCOSE-CAPILLARY: 125 mg/dL — AB (ref 70–99)
Glucose-Capillary: 115 mg/dL — ABNORMAL HIGH (ref 70–99)
Glucose-Capillary: 144 mg/dL — ABNORMAL HIGH (ref 70–99)
Glucose-Capillary: 132 mg/dL — ABNORMAL HIGH (ref 70–99)
Glucose-Capillary: 114 mg/dL — ABNORMAL HIGH (ref 70–99)
Glucose-Capillary: 130 mg/dL — ABNORMAL HIGH (ref 70–99)

## 2018-10-02 NOTE — Progress Notes (Signed)
STROKE TEAM PROGRESS NOTE   SUBJECTIVE (INTERVAL HISTORY) No family is at bedside. .She has  not had any nursing home offers yet  . She continues to pass but well with therapy. We will change her hospital bed today upon request from the nurse OBJECTIVE Vitals:   10/02/18 0354 10/02/18 0737 10/02/18 0841 10/02/18 1114  BP:  (!) 147/83 127/83 131/76  Pulse: 67 69 62 73  Resp: 19 19 13  (!) 25  Temp:  99.4 F (37.4 C)  97.7 F (36.5 C)  TempSrc:  Axillary  Oral  SpO2: 98% 98% 97% 98%  Weight:      Height:        CBC:  Recent Labs  Lab 09/29/18 0726 10/01/18 0655  WBC 11.9* 11.9*  HGB 9.2* 9.0*  HCT 31.0* 30.8*  MCV 87.1 88.3  PLT 368 324    Basic Metabolic Panel:  Recent Labs  Lab 09/29/18 0901 10/01/18 0655  NA 143 141  K 4.1 4.1  CL 114* 110  CO2 24 23  GLUCOSE 152* 187*  BUN 26* 27*  CREATININE 0.74 0.74  CALCIUM 10.6* 10.7*    Lipid Panel:     Component Value Date/Time   CHOL 256 (H) 08/15/2018 0512   TRIG 248 (H) 08/15/2018 0512   HDL 35 (L) 08/15/2018 0512   CHOLHDL 7.3 08/15/2018 0512   VLDL 50 (H) 08/15/2018 0512   LDLCALC 171 (H) 08/15/2018 0512   HgbA1c:  Lab Results  Component Value Date   HGBA1C 11.4 (H) 08/15/2018   Urine Drug Screen:     Component Value Date/Time   LABOPIA NONE DETECTED 08/18/2018 1836   COCAINSCRNUR NONE DETECTED 08/05/2018 1836   LABBENZ NONE DETECTED 09/01/2018 1836   AMPHETMU NONE DETECTED 08/17/2018 1836   THCU NONE DETECTED 08/13/2018 1836   LABBARB NONE DETECTED 08/29/2018 1836    Alcohol Level No results found for: ETH  IMAGING  Ct Angio Head W Or Wo Contrast Ct Angio Neck W Or Wo Contrast 08/15/2018 IMPRESSION:  1. Interval basilar to left proximal PCA stenting. The density of the stent walls precludes detection of in stent stenosis; there is a degree of wasting at the mid basilar segment. There is flow in the left PCA beyond the stent such that there is presumed stent patency.  2. Known lower pontine  infarct. There are new small cerebellar infarcts since brain MRI yesterday.  3. Severe left P2 segment stenosis, also seen previously.  4. Moderate atheromatous narrowing in the proximal left MCA.    Ct Angio Head W Or Wo Contrast Ct Angio Neck W Or Wo Contrast 08/26/2018 IMPRESSION:  1. 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 No definite acute thrombus identified.  2. Severe stenosis left posterior cerebral artery and moderate stenosis right posterior cerebral artery.  3. Mild stenosis left MCA and moderate stenosis left MCA bifurcation.  4. No significant carotid or vertebral artery stenosis in the neck.    Ct Head Wo Contrast 08/13/2018 IMPRESSION:  No acute intracranial abnormalities. Mild white matter changes likely due to small vessel ischemia.    Mr Brain Wo Contrast 08/23/2018 IMPRESSION:  1. Acute nonhemorrhagic infarct involving the left paramedian brainstem. The infarct crosses midline.  2. Other periventricular and subcortical white matter disease is moderately advanced for age. This likely reflects the sequela of chronic microvascular ischemia.  3. Tapering of the dens with prominent soft tissue pannus. 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  Ct Head Code Stroke Wo Contrast 08/20/2018  IMPRESSION:  1. No acute abnormality and no change from earlier today  2. ASPECTS is 10 3.    Cerebral Angiogram 08/20/2018 S/P 4 vessel cerebral arteriogram RT CFA approach. 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 S/P stent assisted angioplasty of distal basilar artery with patency of 80 to 90 %. 2.Approx 70 % stenosis of LT ICA supraclinoid seg   MRI 08/15/18 1. Progressive acute pontine infarct. New patchy bilateral cerebellar infarction. New tiny left thalamic infarcts. 2. Preserved flow void  in the stented basilar. 3. left facial/submandibular edema, please correlate with neck exam. Edited result: IMPRESSION: 1. Progressive acute pontine infarct. New patchy bilateral cerebellar infarction. New tiny left thalamic infarcts. 2. Preserved flow void in the stented basilar.  TTE - Left ventricle: The cavity size was normal. There was severe   concentric hypertrophy. Systolic function was normal. The   estimated ejection fraction was in the range of 60% to 65%. Wall   motion was normal; there were no regional wall motion   abnormalities. Doppler parameters are consistent with abnormal   left ventricular relaxation (grade 1 diastolic dysfunction). The   E/e&' ratio is <8, suggesting normal LV filling pressure. - Aortic valve: Trileaflet; mildly calcified leaflets.   Transvalvular velocity was minimally increased. There was no   regurgitation. Mean gradient (S): 12 mm Hg. - Mitral valve: Mildly thickened leaflets . There was trivial   regurgitation. - Left atrium: The atrium was normal in size. - Inferior vena cava: The vessel was dilated. The respirophasic   diameter changes were blunted (< 50%), consistent with elevated   central venous pressure. Impressions: - LVEF 60-65%, severe LVH, normal wall motion, grade 1 DD, normal   LV filling pressure, minimally increased aortic velocity without   signficant stenosis, trivial MR, normal LA size, dilated IVC.  CT abdomen and pelvis 09/03/18 Mild bilateral posterior basilar subsegmental atelectasis. Interval placement of new gastrostomy tube with tip and balloon within gastric lumen. Surgical drain is seen in the soft tissues in previous gastrostomy site. Small amount of gas and stranding is seen in the peritoneal fat anterior and inferior to the stomach which most likely is related to gastrostomy placement. Probable surgical wound seen in right upper quadrant of the anterior abdominal wall. 8.2 x 2.3 cm gas and fluid collection  is seen in the anterior abdominal wall which is unchanged compared to prior exam. Possible abscess cannot be excluded. Enlarged fibroid uterus.  PHYSICAL EXAM  Temp:  [97.7 F (36.5 C)-99.4 F (37.4 C)] 97.7 F (36.5 C) (11/28 1114) Pulse Rate:  [62-77] 73 (11/28 1114) Resp:  [13-25] 25 (11/28 1114) BP: (127-152)/(71-87) 131/76 (11/28 1114) SpO2:  [96 %-100 %] 98 % (11/28 1114) FiO2 (%):  [21 %] 21 % (11/28 0841)  General -  Obese middle-aged African-American lady, on trach collar, not in distress. She has abdominal surgical wound and drainage from the abdominal wall, dressing clean  Ophthalmologic - fundi not visualized due to noncooperation.  Cardiovascular - regular rate and rhythm  Neuro - on trach collar, eyes open, awake, alert. No ptosis or EOMI, denies diplopia.  PERRL. Bilateral facial weakness, tongue protrusion weak. Able to wiggle left hand fingers and left foot toes. 2-/5 LLE and 2/5 LUE. Follows commands and good eye contact.  Speech is moderate dysarthric with speaking valve.   Dense right  hemiplegia persists.  ASSESSMENT/PLAN Ms. ANETA HENDERSHOTT is a 57 y.o. female with history of difficult to control hypertension, insulin-dependent diabetes, obesity, hypothyroidism status post ablation presenting with chest discomfort, right-sided weakness, slurred speech and right facial droop. She did not receive IV t-PA due to late presentation. S/P stent assisted angioplasty of distal basilar artery.  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 Worsening symptoms with extension of pontine/medullary infarcts with new small bilateral cerebellar infarcts without evidence of stent re-stenosis or occlusion  Resultant quadriplegia, on trach and peg  CT head - No acute intracranial abnormalities.  CTA H&N 08/10/2018 - Extensive atherosclerotic disease in the basilar with focal critical stenosis in the mid to distal basilar. No definite acute  thrombus identified. Severe stenosis left posterior cerebral artery  MRI head - Acute nonhemorrhagic infarct involving the left paramedian brainstem.   CTA H&N 08/15/2018 - stent too dense to see BA lumen, but distal flow preserved. New small cerebellar infarcts since brain MRI yesterday.  Severe left P2 segment stenosis, also seen previously.   Repeat MRI - extension of b/l pontine and upper medullary infarcts with new b/l cerebellar infarcts.  2D Echo - EF 60-65%  LDL - 174  HgbA1c - 11.1  VTE prophylaxis - heparin subq  aspirin 81 mg daily prior to admission, now resumed on Brilinta 90 mg bid and ASA 81 mg daily.   Patient counseled to be compliant with her antithrombotic medications  Ongoing aggressive stroke risk factor management  Therapy recommendations:  SNF  Disposition:  SNF  Placement - palliative care service has discussed with pt using speaking valve and clearly pt would like full aggressive care. SW still working on the placement which is very difficult at this time.    Abnormal uterine bleeding, improved  Has been following with GYN  Was on megace in the past  CT abd/pelvis 09/03/18 - enlarged fibroid uterus  continue megace 80mg  bid  Continue ASA and brilinta for now  OBGYN recs appreciated  Gave prmarin 25mg  IV as recommended - vaginal bleeding much improved  Close CBC monitoring  Anemia   Hb 6.8->PRBC->7.1->9.0-7.7->7.0->PRBC->8.6->8.5->8.3->8.7  Likely due to iron deficiency, heavy periods and acute uterine bleeding and recent surgeries  Iron panel showed iron deficiency  On iron solutions  PRBC transfusion 1 U 09/21/2018 and 2U 09/07/18 and 2U 09/21/18  CBC monitoring  Intracranial stenosis  BA mid to distal severe stenosis s/p BA stenting  Left PCA severe stenosis, R PCA moderate stenosis  Left MCA moderate stenosis  Uncontrolled stroke risk factors with HLD, HTN, DM  Non compliance with meds at home  Respiratory  failure  S/p trach 08/06/2018  On trach collar now  Able to speak with speaking valve although dysarthric  Trach secretion decreasing      Hypertension  Stable so far  BP goal 130-150   On Coreg 25 bid, amlodipine and hydralazine  Close monitoring  Hyperlipidemia  Lipid lowering medication PTA:  Lipitor 20 mg daily  LDL 174, goal < 70  Increased to 80 mg daily  Continue statin at discharge  Diabetes  HgbA1c 11.4, goal < 7.0  Uncontrolled at home  Glucose stable after insulin adjustment  Decrease Lantus again today to 15U bid   Decrease basal NovoLog again today to 3U Q4h  SSI  CBG monitoring  Low grade fever with leukocytosis, resolved  Tmax 101.6 -> afebrile  Leukocytosis - 14.0->12.1->9.9->11.2->11.5->11.3->9.5->9.9->9.1->8.5  UA WBC 21-50  CXR unremarkable, improving  from prior  Blood cultures negative 10/26   Respiratory cultures - mixed flora   Abdominal abscess culture showed proteus mirabilis  Off antibiotics - completed course  Hypernatremia -> hyponatremia-> resolved  Na 149->148->150->147->144->143->134->133->137->140->138->142  On TF @ 65  on NS @ 20  Continue BMP monitoring  Dysphagia s/p PEG complicated by abdominal wall abscess  Continue tube feeding @ 45 cc/h  PEG done 08/06/2018  Removed and replaced dislodged PEG 08/22/2018  CT showed Abdominal wall abscess - s/p open surgical drainage 08/08/2018 persistent foul odor discharge. Reexploration 09/10/2018  Culture showed peroteus mirabilis  Off meropenum  Trauma surgery following, appreciate recs  Wound debrided appropriately and sutures removed.  No evisceration  Diarrhea, resolved  Likely related to TF  Dietitian has made formula change  Laxatives discontinued  Imodium scheduled dose changed to PRN  Other Stroke Risk Factors  Obesity, Body mass index is 46.55 kg/m., recommend weight loss, diet and exercise as appropriate   Other Active  Problems  Hypokalemia - resolved  Thrombocytosis - likely due to anemia, resolved  Hospital day # 36 Patient and family want full support. SNF options are few and difficult. D/w case Freight forwarder.No change in plan.no nursing home beds available here at. Will continue looking.continue ongoing therapy. No changes This patient is critically ill and at significant risk of neurological worsening, death and care requires constant monitoring of vital signs, hemodynamics,respiratory and cardiac monitoring, extensive review of multiple databases, frequent neurological assessment, discussion with family, other specialists and medical decision making of high complexity.I have made any additions or clarifications directly to the above note.This critical care time does not reflect procedure time, or teaching time or supervisory time of PA/NP/Med Resident etc but could involve care discussion time.  I spent 30 minutes of neurocritical care time  in the care of  this patient.     Antony Contras, MD Stroke Neurology 10/02/2018 1:17 PM   To contact Stroke Continuity provider, please refer to [email or web address] After hours, contact General Neurology

## 2018-10-03 LAB — BASIC METABOLIC PANEL
Anion gap: 6 (ref 5–15)
BUN: 24 mg/dL — AB (ref 6–20)
CO2: 25 mmol/L (ref 22–32)
CREATININE: 0.56 mg/dL (ref 0.44–1.00)
Calcium: 10.8 mg/dL — ABNORMAL HIGH (ref 8.9–10.3)
Chloride: 111 mmol/L (ref 98–111)
GFR calc Af Amer: >60 mL/min (ref >60)
GLUCOSE: 137 mg/dL — AB (ref 70–99)
Potassium: 4.2 mmol/L (ref 3.5–5.1)
SODIUM: 142 mmol/L (ref 135–145)

## 2018-10-03 LAB — CBC
HCT: 32.6 % — ABNORMAL LOW (ref 36.0–46.0)
Hemoglobin: 9.4 g/dL — ABNORMAL LOW (ref 12.0–15.0)
MCH: 25.2 pg — AB (ref 26.0–34.0)
MCHC: 28.8 g/dL — AB (ref 30.0–36.0)
MCV: 87.4 fL (ref 80.0–100.0)
Platelets: 426 10*3/uL — ABNORMAL HIGH (ref 150–400)
RBC: 3.73 MIL/uL — ABNORMAL LOW (ref 3.87–5.11)
RDW: 17.9 % — AB (ref 11.5–15.5)
WBC: 14 10*3/uL — AB (ref 4.0–10.5)
nRBC: 0.1 % (ref 0.0–0.2)

## 2018-10-03 LAB — GLUCOSE, CAPILLARY
GLUCOSE-CAPILLARY: 135 mg/dL — AB (ref 70–99)
GLUCOSE-CAPILLARY: 139 mg/dL — AB (ref 70–99)
Glucose-Capillary: 127 mg/dL — ABNORMAL HIGH (ref 70–99)
Glucose-Capillary: 111 mg/dL — ABNORMAL HIGH (ref 70–99)
Glucose-Capillary: 122 mg/dL — ABNORMAL HIGH (ref 70–99)

## 2018-10-03 NOTE — Progress Notes (Signed)
STROKE TEAM PROGRESS NOTE   SUBJECTIVE (INTERVAL HISTORY) No family is at bedside. .She has  not had any nursing home offers yet  . No changes.Her trach secretions persist requiring frequent cleaning of the inner cannula OBJECTIVE Vitals:   10/03/18 0400 10/03/18 0740 10/03/18 0933 10/03/18 1123  BP:  (!) 160/83  (!) 152/77  Pulse:  82  80  Resp:  (!) 21  (!) 23  Temp: (!) 97.5 F (36.4 C) 97.8 F (36.6 C)  98.2 F (36.8 C)  TempSrc: Oral Oral  Oral  SpO2:  100% 99% 99%  Weight:      Height:        CBC:  Recent Labs  Lab 10/01/18 0655 10/03/18 0651  WBC 11.9* 14.0*  HGB 9.0* 9.4*  HCT 30.8* 32.6*  MCV 88.3 87.4  PLT 369 426*    Basic Metabolic Panel:  Recent Labs  Lab 10/01/18 0655 10/03/18 0651  NA 141 142  K 4.1 4.2  CL 110 111  CO2 23 25  GLUCOSE 187* 137*  BUN 27* 24*  CREATININE 0.74 0.56  CALCIUM 10.7* 10.8*    Lipid Panel:     Component Value Date/Time   CHOL 256 (H) 08/15/2018 0512   TRIG 248 (H) 08/15/2018 0512   HDL 35 (L) 08/15/2018 0512   CHOLHDL 7.3 08/15/2018 0512   VLDL 50 (H) 08/15/2018 0512   LDLCALC 171 (H) 08/15/2018 0512   HgbA1c:  Lab Results  Component Value Date   HGBA1C 11.4 (H) 08/15/2018   Urine Drug Screen:     Component Value Date/Time   LABOPIA NONE DETECTED 08/13/2018 1836   COCAINSCRNUR NONE DETECTED 08/28/2018 1836   LABBENZ NONE DETECTED 08/26/2018 1836   AMPHETMU NONE DETECTED 08/17/2018 1836   THCU NONE DETECTED 08/11/2018 1836   LABBARB NONE DETECTED 08/15/2018 1836    Alcohol Level No results found for: ETH  IMAGING  Ct Angio Head W Or Wo Contrast Ct Angio Neck W Or Wo Contrast 08/15/2018 IMPRESSION:  1. Interval basilar to left proximal PCA stenting. The density of the stent walls precludes detection of in stent stenosis; there is a degree of wasting at the mid basilar segment. There is flow in the left PCA beyond the stent such that there is presumed stent patency.  2. Known lower pontine infarct.  There are new small cerebellar infarcts since brain MRI yesterday.  3. Severe left P2 segment stenosis, also seen previously.  4. Moderate atheromatous narrowing in the proximal left MCA.    Ct Angio Head W Or Wo Contrast Ct Angio Neck W Or Wo Contrast 08/28/2018 IMPRESSION:  1. 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 No definite acute thrombus identified.  2. Severe stenosis left posterior cerebral artery and moderate stenosis right posterior cerebral artery.  3. Mild stenosis left MCA and moderate stenosis left MCA bifurcation.  4. No significant carotid or vertebral artery stenosis in the neck.    Ct Head Wo Contrast 08/06/2018 IMPRESSION:  No acute intracranial abnormalities. Mild white matter changes likely due to small vessel ischemia.    Mr Brain Wo Contrast 08/13/2018 IMPRESSION:  1. Acute nonhemorrhagic infarct involving the left paramedian brainstem. The infarct crosses midline.  2. Other periventricular and subcortical white matter disease is moderately advanced for age. This likely reflects the sequela of chronic microvascular ischemia.  3. Tapering of the dens with prominent soft tissue pannus. 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  Ct Head Code Stroke Wo Contrast 08/06/2018  IMPRESSION:  1. No acute abnormality and no change from earlier today  2. ASPECTS is 10 3.    Cerebral Angiogram 08/06/2018 S/P 4 vessel cerebral arteriogram RT CFA approach. 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 S/P stent assisted angioplasty of distal basilar artery with patency of 80 to 90 %. 2.Approx 70 % stenosis of LT ICA supraclinoid seg   MRI 08/15/18 1. Progressive acute pontine infarct. New patchy bilateral cerebellar infarction. New tiny left thalamic infarcts. 2. Preserved flow void in the  stented basilar. 3. left facial/submandibular edema, please correlate with neck exam. Edited result: IMPRESSION: 1. Progressive acute pontine infarct. New patchy bilateral cerebellar infarction. New tiny left thalamic infarcts. 2. Preserved flow void in the stented basilar.  TTE - Left ventricle: The cavity size was normal. There was severe   concentric hypertrophy. Systolic function was normal. The   estimated ejection fraction was in the range of 60% to 65%. Wall   motion was normal; there were no regional wall motion   abnormalities. Doppler parameters are consistent with abnormal   left ventricular relaxation (grade 1 diastolic dysfunction). The   E/e&' ratio is <8, suggesting normal LV filling pressure. - Aortic valve: Trileaflet; mildly calcified leaflets.   Transvalvular velocity was minimally increased. There was no   regurgitation. Mean gradient (S): 12 mm Hg. - Mitral valve: Mildly thickened leaflets . There was trivial   regurgitation. - Left atrium: The atrium was normal in size. - Inferior vena cava: The vessel was dilated. The respirophasic   diameter changes were blunted (< 50%), consistent with elevated   central venous pressure. Impressions: - LVEF 60-65%, severe LVH, normal wall motion, grade 1 DD, normal   LV filling pressure, minimally increased aortic velocity without   signficant stenosis, trivial MR, normal LA size, dilated IVC.  CT abdomen and pelvis 09/03/18 Mild bilateral posterior basilar subsegmental atelectasis. Interval placement of new gastrostomy tube with tip and balloon within gastric lumen. Surgical drain is seen in the soft tissues in previous gastrostomy site. Small amount of gas and stranding is seen in the peritoneal fat anterior and inferior to the stomach which most likely is related to gastrostomy placement. Probable surgical wound seen in right upper quadrant of the anterior abdominal wall. 8.2 x 2.3 cm gas and fluid collection is seen  in the anterior abdominal wall which is unchanged compared to prior exam. Possible abscess cannot be excluded. Enlarged fibroid uterus.  PHYSICAL EXAM  Temp:  [97.5 F (36.4 C)-98.2 F (36.8 C)] 98.2 F (36.8 C) (11/29 1123) Pulse Rate:  [60-82] 80 (11/29 1123) Resp:  [15-29] 23 (11/29 1123) BP: (141-160)/(67-83) 152/77 (11/29 1123) SpO2:  [98 %-100 %] 99 % (11/29 1123) FiO2 (%):  [21 %] 21 % (11/29 0933)  General -  Obese middle-aged African-American lady, on trach collar, not in distress. She has abdominal surgical wound and drainage from the abdominal wall, dressing clean  Ophthalmologic - fundi not visualized due to noncooperation.  Cardiovascular - regular rate and rhythm  Neuro - on trach collar, eyes open, awake, alert. No ptosis or EOMI, denies diplopia.  PERRL. Bilateral facial weakness, tongue protrusion weak. Able to wiggle left hand fingers and left foot toes. 2-/5 LLE and 2/5 LUE. Follows commands and good eye contact.  Speech is moderate dysarthric with speaking valve.   Dense right  hemiplegia persists.  ASSESSMENT/PLAN Ms. JANIRA MANDELL is a 57 y.o. female with history of difficult to control hypertension, insulin-dependent diabetes, obesity, hypothyroidism status post ablation presenting with chest discomfort, right-sided weakness, slurred speech and right facial droop. She did not receive IV t-PA due to late presentation. S/P stent assisted angioplasty of distal basilar artery.  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 Worsening symptoms with extension of pontine/medullary infarcts with new small bilateral cerebellar infarcts without evidence of stent re-stenosis or occlusion  Resultant quadriplegia, on trach and peg  CT head - No acute intracranial abnormalities.  CTA H&N 08/26/2018 - Extensive atherosclerotic disease in the basilar with focal critical stenosis in the mid to distal basilar. No definite acute thrombus  identified. Severe stenosis left posterior cerebral artery  MRI head - Acute nonhemorrhagic infarct involving the left paramedian brainstem.   CTA H&N 08/15/2018 - stent too dense to see BA lumen, but distal flow preserved. New small cerebellar infarcts since brain MRI yesterday.  Severe left P2 segment stenosis, also seen previously.   Repeat MRI - extension of b/l pontine and upper medullary infarcts with new b/l cerebellar infarcts.  2D Echo - EF 60-65%  LDL - 174  HgbA1c - 11.1  VTE prophylaxis - heparin subq  aspirin 81 mg daily prior to admission, now resumed on Brilinta 90 mg bid and ASA 81 mg daily.   Patient counseled to be compliant with her antithrombotic medications  Ongoing aggressive stroke risk factor management  Therapy recommendations:  SNF  Disposition:  SNF  Placement - palliative care service has discussed with pt using speaking valve and clearly pt would like full aggressive care. SW still working on the placement which is very difficult at this time.    Abnormal uterine bleeding, improved  Has been following with GYN  Was on megace in the past  CT abd/pelvis 09/03/18 - enlarged fibroid uterus  continue megace 80mg  bid  Continue ASA and brilinta for now  OBGYN recs appreciated  Gave prmarin 25mg  IV as recommended - vaginal bleeding much improved  Close CBC monitoring  Anemia   Hb 6.8->PRBC->7.1->9.0-7.7->7.0->PRBC->8.6->8.5->8.3->8.7  Likely due to iron deficiency, heavy periods and acute uterine bleeding and recent surgeries  Iron panel showed iron deficiency  On iron solutions  PRBC transfusion 1 U 09/16/2018 and 2U 09/07/18 and 2U 09/21/18  CBC monitoring  Intracranial stenosis  BA mid to distal severe stenosis s/p BA stenting  Left PCA severe stenosis, R PCA moderate stenosis  Left MCA moderate stenosis  Uncontrolled stroke risk factors with HLD, HTN, DM  Non compliance with meds at home  Respiratory failure  S/p trach  08/05/2018  On trach collar now  Able to speak with speaking valve although dysarthric  Trach secretion decreasing      Hypertension  Stable so far  BP goal 130-150   On Coreg 25 bid, amlodipine and hydralazine  Close monitoring  Hyperlipidemia  Lipid lowering medication PTA:  Lipitor 20 mg daily  LDL 174, goal < 70  Increased to 80 mg daily  Continue statin at discharge  Diabetes  HgbA1c 11.4, goal < 7.0  Uncontrolled at home  Glucose stable after insulin adjustment  Decrease Lantus again today to 15U bid   Decrease basal NovoLog again today to 3U Q4h  SSI  CBG monitoring  Low grade fever with leukocytosis, resolved  Tmax 101.6 -> afebrile  Leukocytosis - 14.0->12.1->9.9->11.2->11.5->11.3->9.5->9.9->9.1->8.5  UA WBC 21-50  CXR unremarkable, improving  from prior  Blood cultures negative 10/26   Respiratory cultures - mixed flora   Abdominal abscess culture showed proteus mirabilis  Off antibiotics - completed course  Hypernatremia -> hyponatremia-> resolved  Na 149->148->150->147->144->143->134->133->137->140->138->142  On TF @ 65  on NS @ 20  Continue BMP monitoring  Dysphagia s/p PEG complicated by abdominal wall abscess  Continue tube feeding @ 45 cc/h  PEG done 08/31/2018  Removed and replaced dislodged PEG 08/26/2018  CT showed Abdominal wall abscess - s/p open surgical drainage 08/22/2018 persistent foul odor discharge. Reexploration 09/14/2018  Culture showed peroteus mirabilis  Off meropenum  Trauma surgery following, appreciate recs  Wound debrided appropriately and sutures removed.  No evisceration  Diarrhea, resolved  Likely related to TF  Dietitian has made formula change  Laxatives discontinued  Imodium scheduled dose changed to PRN  Other Stroke Risk Factors  Obesity, Body mass index is 46.55 kg/m., recommend weight loss, diet and exercise as appropriate   Other Active Problems  Hypokalemia -  resolved  Thrombocytosis - likely due to anemia, resolved  Hospital day # 50 Patient and family want full support. SNF options are few and difficult.  No change in plan.No nursing home beds available here at. Will continue looking.continue ongoing therapy. No changes This patient is critically ill and at significant risk of neurological worsening, death and care requires constant monitoring of vital signs, hemodynamics,respiratory and cardiac monitoring, extensive review of multiple databases, frequent neurological assessment, discussion with family, other specialists and medical decision making of high complexity.I have made any additions or clarifications directly to the above note.This critical care time does not reflect procedure time, or teaching time or supervisory time of PA/NP/Med Resident etc but could involve care discussion time.  I spent 30 minutes of neurocritical care time  in the care of  this patient.     Antony Contras, MD Stroke Neurology 10/03/2018 1:17 PM   To contact Stroke Continuity provider, please refer to [email or web address] After hours, contact General Neurology

## 2018-10-04 LAB — GLUCOSE, CAPILLARY
GLUCOSE-CAPILLARY: 141 mg/dL — AB (ref 70–99)
GLUCOSE-CAPILLARY: 106 mg/dL — AB (ref 70–99)
Glucose-Capillary: 121 mg/dL — ABNORMAL HIGH (ref 70–99)
Glucose-Capillary: 124 mg/dL — ABNORMAL HIGH (ref 70–99)
Glucose-Capillary: 134 mg/dL — ABNORMAL HIGH (ref 70–99)
Glucose-Capillary: 138 mg/dL — ABNORMAL HIGH (ref 70–99)

## 2018-10-04 NOTE — Progress Notes (Signed)
STROKE TEAM PROGRESS NOTE   SUBJECTIVE (INTERVAL HISTORY) No family is at bedside. .She has  not had any nursing home offers yet  .  Marland KitchenHer trach secretions persist.no changes OBJECTIVE Vitals:   10/04/18 0854 10/04/18 0900 10/04/18 1100 10/04/18 1200  BP:    (!) 173/89  Pulse:  84 83 83  Resp:  19 15 (!) 22  Temp:    99.8 F (37.7 C)  TempSrc:    Oral  SpO2: 100% 100% 100% 100%  Weight:      Height:        CBC:  Recent Labs  Lab 10/01/18 0655 10/03/18 0651  WBC 11.9* 14.0*  HGB 9.0* 9.4*  HCT 30.8* 32.6*  MCV 88.3 87.4  PLT 369 426*    Basic Metabolic Panel:  Recent Labs  Lab 10/01/18 0655 10/03/18 0651  NA 141 142  K 4.1 4.2  CL 110 111  CO2 23 25  GLUCOSE 187* 137*  BUN 27* 24*  CREATININE 0.74 0.56  CALCIUM 10.7* 10.8*    Lipid Panel:     Component Value Date/Time   CHOL 256 (H) 08/15/2018 0512   TRIG 248 (H) 08/15/2018 0512   HDL 35 (L) 08/15/2018 0512   CHOLHDL 7.3 08/15/2018 0512   VLDL 50 (H) 08/15/2018 0512   LDLCALC 171 (H) 08/15/2018 0512   HgbA1c:  Lab Results  Component Value Date   HGBA1C 11.4 (H) 08/15/2018   Urine Drug Screen:     Component Value Date/Time   LABOPIA NONE DETECTED 08/23/2018 1836   COCAINSCRNUR NONE DETECTED 08/08/2018 1836   LABBENZ NONE DETECTED 08/09/2018 1836   AMPHETMU NONE DETECTED 08/25/2018 1836   THCU NONE DETECTED 08/10/2018 1836   LABBARB NONE DETECTED 08/24/2018 1836    Alcohol Level No results found for: ETH  IMAGING  Ct Angio Head W Or Wo Contrast Ct Angio Neck W Or Wo Contrast 08/15/2018 IMPRESSION:  1. Interval basilar to left proximal PCA stenting. The density of the stent walls precludes detection of in stent stenosis; there is a degree of wasting at the mid basilar segment. There is flow in the left PCA beyond the stent such that there is presumed stent patency.  2. Known lower pontine infarct. There are new small cerebellar infarcts since brain MRI yesterday.  3. Severe left P2 segment  stenosis, also seen previously.  4. Moderate atheromatous narrowing in the proximal left MCA.    Ct Angio Head W Or Wo Contrast Ct Angio Neck W Or Wo Contrast 08/13/2018 IMPRESSION:  1. 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 No definite acute thrombus identified.  2. Severe stenosis left posterior cerebral artery and moderate stenosis right posterior cerebral artery.  3. Mild stenosis left MCA and moderate stenosis left MCA bifurcation.  4. No significant carotid or vertebral artery stenosis in the neck.    Ct Head Wo Contrast 09/04/2018 IMPRESSION:  No acute intracranial abnormalities. Mild white matter changes likely due to small vessel ischemia.    Mr Brain Wo Contrast 08/15/2018 IMPRESSION:  1. Acute nonhemorrhagic infarct involving the left paramedian brainstem. The infarct crosses midline.  2. Other periventricular and subcortical white matter disease is moderately advanced for age. This likely reflects the sequela of chronic microvascular ischemia.  3. Tapering of the dens with prominent soft tissue pannus. 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    Ct Head Code Stroke Wo Contrast 08/08/2018  IMPRESSION:  1. No acute abnormality and  no change from earlier today  2. ASPECTS is 10 3.    Cerebral Angiogram 08/25/2018 S/P 4 vessel cerebral arteriogram RT CFA approach. 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 S/P stent assisted angioplasty of distal basilar artery with patency of 80 to 90 %. 2.Approx 70 % stenosis of LT ICA supraclinoid seg   MRI 08/15/18 1. Progressive acute pontine infarct. New patchy bilateral cerebellar infarction. New tiny left thalamic infarcts. 2. Preserved flow void in the stented basilar. 3. left facial/submandibular edema, please correlate with neck exam. Edited  result: IMPRESSION: 1. Progressive acute pontine infarct. New patchy bilateral cerebellar infarction. New tiny left thalamic infarcts. 2. Preserved flow void in the stented basilar.  TTE - Left ventricle: The cavity size was normal. There was severe   concentric hypertrophy. Systolic function was normal. The   estimated ejection fraction was in the range of 60% to 65%. Wall   motion was normal; there were no regional wall motion   abnormalities. Doppler parameters are consistent with abnormal   left ventricular relaxation (grade 1 diastolic dysfunction). The   E/e&' ratio is <8, suggesting normal LV filling pressure. - Aortic valve: Trileaflet; mildly calcified leaflets.   Transvalvular velocity was minimally increased. There was no   regurgitation. Mean gradient (S): 12 mm Hg. - Mitral valve: Mildly thickened leaflets . There was trivial   regurgitation. - Left atrium: The atrium was normal in size. - Inferior vena cava: The vessel was dilated. The respirophasic   diameter changes were blunted (< 50%), consistent with elevated   central venous pressure. Impressions: - LVEF 60-65%, severe LVH, normal wall motion, grade 1 DD, normal   LV filling pressure, minimally increased aortic velocity without   signficant stenosis, trivial MR, normal LA size, dilated IVC.  CT abdomen and pelvis 09/03/18 Mild bilateral posterior basilar subsegmental atelectasis. Interval placement of new gastrostomy tube with tip and balloon within gastric lumen. Surgical drain is seen in the soft tissues in previous gastrostomy site. Small amount of gas and stranding is seen in the peritoneal fat anterior and inferior to the stomach which most likely is related to gastrostomy placement. Probable surgical wound seen in right upper quadrant of the anterior abdominal wall. 8.2 x 2.3 cm gas and fluid collection is seen in the anterior abdominal wall which is unchanged compared to prior exam. Possible abscess  cannot be excluded. Enlarged fibroid uterus.  PHYSICAL EXAM  Temp:  [98.4 F (36.9 C)-100 F (37.8 C)] 99.8 F (37.7 C) (11/30 1200) Pulse Rate:  [71-86] 83 (11/30 1200) Resp:  [12-24] 22 (11/30 1200) BP: (120-173)/(64-89) 173/89 (11/30 1200) SpO2:  [97 %-100 %] 100 % (11/30 1200) FiO2 (%):  [21 %-28 %] 21 % (11/30 1100)  General -  Obese middle-aged African-American lady, on trach collar, not in distress. She has abdominal surgical wound and drainage from the abdominal wall, dressing clean  Ophthalmologic - fundi not visualized due to noncooperation.  Cardiovascular - regular rate and rhythm  Neuro - on trach collar, eyes open, awake, alert. No ptosis or EOMI, denies diplopia.  PERRL. Bilateral facial weakness, tongue protrusion weak. Able to wiggle left hand fingers and left foot toes. 2-/5 LLE and 2/5 LUE. Follows commands and good eye contact.  Speech is moderate dysarthric with speaking valve.   Dense right hemiplegia persists.  ASSESSMENT/PLAN Ms. PHOEBIE SHAD is a 57 y.o. female with  history of difficult to control hypertension, insulin-dependent diabetes, obesity, hypothyroidism status post ablation presenting with chest discomfort, right-sided weakness, slurred speech and right facial droop. She did not receive IV t-PA due to late presentation. S/P stent assisted angioplasty of distal basilar artery.  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 Worsening symptoms with extension of pontine/medullary infarcts with new small bilateral cerebellar infarcts without evidence of stent re-stenosis or occlusion  Resultant quadriplegia, on trach and peg  CT head - No acute intracranial abnormalities.  CTA H&N 08/12/2018 - Extensive atherosclerotic disease in the basilar with focal critical stenosis in the mid to distal basilar. No definite acute thrombus identified. Severe stenosis left posterior cerebral artery  MRI head - Acute  nonhemorrhagic infarct involving the left paramedian brainstem.   CTA H&N 08/15/2018 - stent too dense to see BA lumen, but distal flow preserved. New small cerebellar infarcts since brain MRI yesterday.  Severe left P2 segment stenosis, also seen previously.   Repeat MRI - extension of b/l pontine and upper medullary infarcts with new b/l cerebellar infarcts.  2D Echo - EF 60-65%  LDL - 174  HgbA1c - 11.1  VTE prophylaxis - heparin subq  aspirin 81 mg daily prior to admission, now resumed on Brilinta 90 mg bid and ASA 81 mg daily.   Patient counseled to be compliant with her antithrombotic medications  Ongoing aggressive stroke risk factor management  Therapy recommendations:  SNF  Disposition:  SNF  Placement - palliative care service has discussed with pt using speaking valve and clearly pt has expressed a desire  that she would like full aggressive care. SW still working on the placement which is very difficult at this time.    Abnormal uterine bleeding, improved  Has been following with GYN  Was on megace in the past  CT abd/pelvis 09/03/18 - enlarged fibroid uterus  continue megace 80mg  bid  Continue ASA and brilinta for now  OBGYN recs appreciated  Gave prmarin 25mg  IV as recommended - vaginal bleeding much improved  Close CBC monitoring  Anemia   Hb 6.8->PRBC->7.1->9.0-7.7->7.0->PRBC->8.6->8.5->8.3->8.7  Likely due to iron deficiency, heavy periods and acute uterine bleeding and recent surgeries  Iron panel showed iron deficiency  On iron solutions  PRBC transfusion 1 U 09/27/2018 and 2U 09/07/18 and 2U 09/21/18  CBC monitoring  Intracranial stenosis  BA mid to distal severe stenosis s/p BA stenting  Left PCA severe stenosis, R PCA moderate stenosis  Left MCA moderate stenosis  Uncontrolled stroke risk factors with HLD, HTN, DM  Non compliance with meds at home  Respiratory failure  S/p trach 08/18/2018  On trach collar now  Able to  speak with speaking valve although dysarthric  Trach secretion decreasing      Hypertension  Stable so far  BP goal 130-150   On Coreg 25 bid, amlodipine and hydralazine  Close monitoring  Hyperlipidemia  Lipid lowering medication PTA:  Lipitor 20 mg daily  LDL 174, goal < 70  Increased to 80 mg daily  Continue statin at discharge  Diabetes  HgbA1c 11.4, goal < 7.0  Uncontrolled at home  Glucose stable after insulin adjustment  Decrease Lantus again today to 15U bid   Decrease basal NovoLog again today to 3U Q4h  SSI  CBG monitoring  Low grade fever with leukocytosis, resolved  Tmax 101.6 -> afebrile  Leukocytosis - 14.0->12.1->9.9->11.2->11.5->11.3->9.5->9.9->9.1->8.5  UA WBC 21-50  CXR unremarkable, improving from prior  Blood cultures negative 10/26  Respiratory cultures - mixed flora   Abdominal abscess culture showed proteus mirabilis  Off antibiotics - completed course  Hypernatremia -> hyponatremia-> resolved  Na 149->148->150->147->144->143->134->133->137->140->138->142  On TF @ 65  on NS @ 20  Continue BMP monitoring  Dysphagia s/p PEG complicated by abdominal wall abscess  Continue tube feeding @ 45 cc/h  PEG done 08/12/2018  Removed and replaced dislodged PEG 08/30/2018  CT showed Abdominal wall abscess - s/p open surgical drainage 08/10/2018 persistent foul odor discharge. Reexploration 09/08/2018  Culture showed peroteus mirabilis  Off meropenum  Trauma surgery following, appreciate recs  Wound debrided appropriately and sutures removed.  No evisceration  Diarrhea, resolved  Likely related to TF  Dietitian has made formula change  Laxatives discontinued  Imodium scheduled dose changed to PRN  Other Stroke Risk Factors  Obesity, Body mass index is 46.55 kg/m., recommend weight loss, diet and exercise as appropriate   Other Active Problems  Hypokalemia - resolved  Thrombocytosis - likely due to anemia,  resolved  Hospital day # 51 Patient and family want full support. SNF options are few and difficult.  No change in plan.No nursing home beds available here at. Will continue looking.continue ongoing therapy. No changes This patient is critically ill and at significant risk of neurological worsening, death and care requires constant monitoring of vital signs, hemodynamics,respiratory and cardiac monitoring, extensive review of multiple databases, frequent neurological assessment, discussion with family, other specialists and medical decision making of high complexity.I have made any additions or clarifications directly to the above note.This critical care time does not reflect procedure time, or teaching time or supervisory time of PA/NP/Med Resident etc but could involve care discussion time.  I spent 30 minutes of neurocritical care time  in the care of  this patient.     Antony Contras, MD Stroke Neurology 10/04/2018 1:51 PM   To contact Stroke Continuity provider, please refer to [email or web address] After hours, contact General Neurology

## 2018-10-05 LAB — GLUCOSE, CAPILLARY
Glucose-Capillary: 126 mg/dL — ABNORMAL HIGH (ref 70–99)
Glucose-Capillary: 135 mg/dL — ABNORMAL HIGH (ref 70–99)
Glucose-Capillary: 143 mg/dL — ABNORMAL HIGH (ref 70–99)
Glucose-Capillary: 143 mg/dL — ABNORMAL HIGH (ref 70–99)
Glucose-Capillary: 143 mg/dL — ABNORMAL HIGH (ref 70–99)
Glucose-Capillary: 146 mg/dL — ABNORMAL HIGH (ref 70–99)
Glucose-Capillary: 183 mg/dL — ABNORMAL HIGH (ref 70–99)

## 2018-10-05 NOTE — Progress Notes (Signed)
STROKE TEAM PROGRESS NOTE   SUBJECTIVE (INTERVAL HISTORY) No family is at bedside. . .  .Her trach secretions persist.No changes OBJECTIVE Vitals:   10/05/18 0305 10/05/18 0316 10/05/18 0812 10/05/18 1142  BP:   (!) 145/76   Pulse: 82 85 78 82  Resp: 20 (!) 24  (!) 32  Temp:  99.1 F (37.3 C) 98.8 F (37.1 C)   TempSrc:  Oral Oral   SpO2: 100% 100% 100% 100%  Weight:      Height:        CBC:  Recent Labs  Lab 10/01/18 0655 10/03/18 0651  WBC 11.9* 14.0*  HGB 9.0* 9.4*  HCT 30.8* 32.6*  MCV 88.3 87.4  PLT 369 426*    Basic Metabolic Panel:  Recent Labs  Lab 10/01/18 0655 10/03/18 0651  NA 141 142  K 4.1 4.2  CL 110 111  CO2 23 25  GLUCOSE 187* 137*  BUN 27* 24*  CREATININE 0.74 0.56  CALCIUM 10.7* 10.8*    Lipid Panel:     Component Value Date/Time   CHOL 256 (H) 08/15/2018 0512   TRIG 248 (H) 08/15/2018 0512   HDL 35 (L) 08/15/2018 0512   CHOLHDL 7.3 08/15/2018 0512   VLDL 50 (H) 08/15/2018 0512   LDLCALC 171 (H) 08/15/2018 0512   HgbA1c:  Lab Results  Component Value Date   HGBA1C 11.4 (H) 08/15/2018   Urine Drug Screen:     Component Value Date/Time   LABOPIA NONE DETECTED 08/23/2018 1836   COCAINSCRNUR NONE DETECTED 08/26/2018 1836   LABBENZ NONE DETECTED 08/19/2018 1836   AMPHETMU NONE DETECTED 08/12/2018 1836   THCU NONE DETECTED 08/29/2018 1836   LABBARB NONE DETECTED 08/23/2018 1836    Alcohol Level No results found for: ETH  IMAGING  Ct Angio Head W Or Wo Contrast Ct Angio Neck W Or Wo Contrast 08/15/2018 IMPRESSION:  1. Interval basilar to left proximal PCA stenting. The density of the stent walls precludes detection of in stent stenosis; there is a degree of wasting at the mid basilar segment. There is flow in the left PCA beyond the stent such that there is presumed stent patency.  2. Known lower pontine infarct. There are new small cerebellar infarcts since brain MRI yesterday.  3. Severe left P2 segment stenosis, also seen  previously.  4. Moderate atheromatous narrowing in the proximal left MCA.    Ct Angio Head W Or Wo Contrast Ct Angio Neck W Or Wo Contrast 08/15/2018 IMPRESSION:  1. 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 No definite acute thrombus identified.  2. Severe stenosis left posterior cerebral artery and moderate stenosis right posterior cerebral artery.  3. Mild stenosis left MCA and moderate stenosis left MCA bifurcation.  4. No significant carotid or vertebral artery stenosis in the neck.    Ct Head Wo Contrast 08/17/2018 IMPRESSION:  No acute intracranial abnormalities. Mild white matter changes likely due to small vessel ischemia.    Mr Brain Wo Contrast 08/13/2018 IMPRESSION:  1. Acute nonhemorrhagic infarct involving the left paramedian brainstem. The infarct crosses midline.  2. Other periventricular and subcortical white matter disease is moderately advanced for age. This likely reflects the sequela of chronic microvascular ischemia.  3. Tapering of the dens with prominent soft tissue pannus. 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    Ct Head Code Stroke Wo Contrast 08/20/2018  IMPRESSION:  1. No acute abnormality and no change from earlier today  2. ASPECTS is 10 3.    Cerebral Angiogram 09/01/2018 S/P 4 vessel cerebral arteriogram RT CFA approach. 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 S/P stent assisted angioplasty of distal basilar artery with patency of 80 to 90 %. 2.Approx 70 % stenosis of LT ICA supraclinoid seg   MRI 08/15/18 1. Progressive acute pontine infarct. New patchy bilateral cerebellar infarction. New tiny left thalamic infarcts. 2. Preserved flow void in the stented basilar. 3. left facial/submandibular edema, please correlate with neck exam. Edited  result: IMPRESSION: 1. Progressive acute pontine infarct. New patchy bilateral cerebellar infarction. New tiny left thalamic infarcts. 2. Preserved flow void in the stented basilar.  TTE - Left ventricle: The cavity size was normal. There was severe   concentric hypertrophy. Systolic function was normal. The   estimated ejection fraction was in the range of 60% to 65%. Wall   motion was normal; there were no regional wall motion   abnormalities. Doppler parameters are consistent with abnormal   left ventricular relaxation (grade 1 diastolic dysfunction). The   E/e&' ratio is <8, suggesting normal LV filling pressure. - Aortic valve: Trileaflet; mildly calcified leaflets.   Transvalvular velocity was minimally increased. There was no   regurgitation. Mean gradient (S): 12 mm Hg. - Mitral valve: Mildly thickened leaflets . There was trivial   regurgitation. - Left atrium: The atrium was normal in size. - Inferior vena cava: The vessel was dilated. The respirophasic   diameter changes were blunted (< 50%), consistent with elevated   central venous pressure. Impressions: - LVEF 60-65%, severe LVH, normal wall motion, grade 1 DD, normal   LV filling pressure, minimally increased aortic velocity without   signficant stenosis, trivial MR, normal LA size, dilated IVC.  CT abdomen and pelvis 09/03/18 Mild bilateral posterior basilar subsegmental atelectasis. Interval placement of new gastrostomy tube with tip and balloon within gastric lumen. Surgical drain is seen in the soft tissues in previous gastrostomy site. Small amount of gas and stranding is seen in the peritoneal fat anterior and inferior to the stomach which most likely is related to gastrostomy placement. Probable surgical wound seen in right upper quadrant of the anterior abdominal wall. 8.2 x 2.3 cm gas and fluid collection is seen in the anterior abdominal wall which is unchanged compared to prior exam. Possible abscess  cannot be excluded. Enlarged fibroid uterus.  PHYSICAL EXAM  Temp:  [97.6 F (36.4 C)-99.1 F (37.3 C)] 98.8 F (37.1 C) (12/01 0812) Pulse Rate:  [70-88] 82 (12/01 1142) Resp:  [14-32] 32 (12/01 1142) BP: (138-148)/(69-80) 145/76 (12/01 0812) SpO2:  [100 %] 100 % (12/01 1142) FiO2 (%):  [21 %] 21 % (12/01 1142)  General -  Obese middle-aged African-American lady, on trach collar, not in distress. She has abdominal surgical wound and drainage from the abdominal wall, dressing clean  Ophthalmologic - fundi not visualized due to noncooperation.  Cardiovascular - regular rate and rhythm  Neuro - on trach collar, eyes open, awake, alert. No ptosis or EOMI, denies diplopia.  PERRL. Bilateral facial weakness, tongue protrusion weak. Able to wiggle left hand fingers and left foot toes. 2-/5 LLE and 2/5 LUE. Follows commands and good eye contact.  Speech is moderate dysarthric with speaking valve.   Dense right hemiplegia persists.  ASSESSMENT/PLAN Andrea [name] is a 57 y.o. female with history of difficult to control hypertension, insulin-dependent diabetes,  obesity, hypothyroidism status post ablation presenting with chest discomfort, right-sided weakness, slurred speech and right facial droop. She did not receive IV t-PA due to late presentation. S/P stent assisted angioplasty of distal basilar artery.  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 Worsening symptoms with extension of pontine/medullary infarcts with new small bilateral cerebellar infarcts without evidence of stent re-stenosis or occlusion  Resultant quadriplegia, on trach and peg  CT head - No acute intracranial abnormalities.  CTA H&N 08/16/2018 - Extensive atherosclerotic disease in the basilar with focal critical stenosis in the mid to distal basilar. No definite acute thrombus identified. Severe stenosis left posterior cerebral artery  MRI head - Acute nonhemorrhagic  infarct involving the left paramedian brainstem.   CTA H&N 08/15/2018 - stent too dense to see BA lumen, but distal flow preserved. New small cerebellar infarcts since brain MRI yesterday.  Severe left P2 segment stenosis, also seen previously.   Repeat MRI - extension of b/l pontine and upper medullary infarcts with new b/l cerebellar infarcts.  2D Echo - EF 60-65%  LDL - 174  HgbA1c - 11.1  VTE prophylaxis - heparin subq  aspirin 81 mg daily prior to admission, now resumed on Brilinta 90 mg bid and ASA 81 mg daily.   Patient counseled to be compliant with her antithrombotic medications  Ongoing aggressive stroke risk factor management  Therapy recommendations:  SNF  Disposition:  SNF  Placement - palliative care service has discussed with pt using speaking valve and clearly pt has expressed a desire  that she would like full aggressive care. SW still working on the placement which is very difficult at this time.    Abnormal uterine bleeding, improved  Has been following with GYN  Was on megace in the past  CT abd/pelvis 09/03/18 - enlarged fibroid uterus  continue megace 80mg  bid  Continue ASA and brilinta for now  OBGYN recs appreciated  Gave prmarin 25mg  IV as recommended - vaginal bleeding much improved  Close CBC monitoring  Anemia   Hb 6.8->PRBC->7.1->9.0-7.7->7.0->PRBC->8.6->8.5->8.3->8.7  Likely due to iron deficiency, heavy periods and acute uterine bleeding and recent surgeries  Iron panel showed iron deficiency  On iron solutions  PRBC transfusion 1 U 09/22/2018 and 2U 09/07/18 and 2U 09/21/18  CBC monitoring  Intracranial stenosis  BA mid to distal severe stenosis s/p BA stenting  Left PCA severe stenosis, R PCA moderate stenosis  Left MCA moderate stenosis  Uncontrolled stroke risk factors with HLD, HTN, DM  Non compliance with meds at home  Respiratory failure  S/p trach 08/15/2018  On trach collar now  Able to speak with speaking  valve although dysarthric  Trach secretion decreasing      Hypertension  Stable so far  BP goal 130-150   On Coreg 25 bid, amlodipine and hydralazine  Close monitoring  Hyperlipidemia  Lipid lowering medication PTA:  Lipitor 20 mg daily  LDL 174, goal < 70  Increased to 80 mg daily  Continue statin at discharge  Diabetes  HgbA1c 11.4, goal < 7.0  Uncontrolled at home  Glucose stable after insulin adjustment  Decrease Lantus again today to 15U bid   Decrease basal NovoLog again today to 3U Q4h  SSI  CBG monitoring  Low grade fever with leukocytosis, resolved  Tmax 101.6 -> afebrile  Leukocytosis - 14.0->12.1->9.9->11.2->11.5->11.3->9.5->9.9->9.1->8.5  UA WBC 21-50  CXR unremarkable, improving from prior  Blood cultures negative 10/26   Respiratory cultures - mixed flora  Abdominal abscess culture showed proteus mirabilis  Off antibiotics - completed course  Hypernatremia -> hyponatremia-> resolved  Na 149->148->150->147->144->143->134->133->137->140->138->142  On TF @ 65  on NS @ 20  Continue BMP monitoring  Dysphagia s/p PEG complicated by abdominal wall abscess  Continue tube feeding @ 45 cc/h  PEG done 08/05/2018  Removed and replaced dislodged PEG 08/12/2018  CT showed Abdominal wall abscess - s/p open surgical drainage 08/10/2018 persistent foul odor discharge. Reexploration 10/04/2018  Culture showed peroteus mirabilis  Off meropenum  Trauma surgery following, appreciate recs  Wound debrided appropriately and sutures removed.  No evisceration  Diarrhea, resolved  Likely related to TF  Dietitian has made formula change  Laxatives discontinued  Imodium scheduled dose changed to PRN  Other Stroke Risk Factors  Obesity, Body mass index is 46.55 kg/m., recommend weight loss, diet and exercise as appropriate   Other Active Problems  Hypokalemia - resolved  Thrombocytosis - likely due to anemia, resolved  Hospital day  # 62 Patient and family want full support. SNF options are few and difficult.  No change in plan.No nursing home beds available here at. Will continue looking.continue ongoing therapy. No changes This patient is critically ill and at significant risk of neurological worsening, death and care requires constant monitoring of vital signs, hemodynamics,respiratory and cardiac monitoring, extensive review of multiple databases, frequent neurological assessment, discussion with family, other specialists and medical decision making of high complexity.I have made any additions or clarifications directly to the above note.This critical care time does not reflect procedure time, or teaching time or supervisory time of PA/NP/Med Resident etc but could involve care discussion time.  I spent 30 minutes of neurocritical care time  in the care of  this patient.     Antony Contras, MD Stroke Neurology 10/05/2018 12:15 PM   To contact Stroke Continuity provider, please refer to [email or web address] After hours, contact General Neurology

## 2018-10-05 DEATH — deceased

## 2018-10-06 LAB — GLUCOSE, CAPILLARY
GLUCOSE-CAPILLARY: 129 mg/dL — AB (ref 70–99)
Glucose-Capillary: 133 mg/dL — ABNORMAL HIGH (ref 70–99)
Glucose-Capillary: 153 mg/dL — ABNORMAL HIGH (ref 70–99)
Glucose-Capillary: 153 mg/dL — ABNORMAL HIGH (ref 70–99)
Glucose-Capillary: 149 mg/dL — ABNORMAL HIGH (ref 70–99)
Glucose-Capillary: 110 mg/dL — ABNORMAL HIGH (ref 70–99)

## 2018-10-06 LAB — CBC
HCT: 31.6 % — ABNORMAL LOW (ref 36.0–46.0)
Hemoglobin: 9.1 g/dL — ABNORMAL LOW (ref 12.0–15.0)
MCH: 25.1 pg — ABNORMAL LOW (ref 26.0–34.0)
MCHC: 28.8 g/dL — ABNORMAL LOW (ref 30.0–36.0)
MCV: 87.1 fL (ref 80.0–100.0)
Platelets: 483 10*3/uL — ABNORMAL HIGH (ref 150–400)
RBC: 3.63 MIL/uL — ABNORMAL LOW (ref 3.87–5.11)
RDW: 17.5 % — AB (ref 11.5–15.5)
WBC: 12.8 10*3/uL — ABNORMAL HIGH (ref 4.0–10.5)
nRBC: 0 % (ref 0.0–0.2)

## 2018-10-06 LAB — BASIC METABOLIC PANEL
Anion gap: 8 (ref 5–15)
BUN: 27 mg/dL — ABNORMAL HIGH (ref 6–20)
CO2: 26 mmol/L (ref 22–32)
Calcium: 11.1 mg/dL — ABNORMAL HIGH (ref 8.9–10.3)
Chloride: 109 mmol/L (ref 98–111)
Creatinine, Ser: 0.79 mg/dL (ref 0.44–1.00)
GFR calc Af Amer: >60 mL/min (ref >60)
GFR calc non Af Amer: >60 mL/min (ref >60)
Glucose, Bld: 170 mg/dL — ABNORMAL HIGH (ref 70–99)
Potassium: 4.3 mmol/L (ref 3.5–5.1)
Sodium: 143 mmol/L (ref 135–145)

## 2018-10-06 NOTE — Progress Notes (Signed)
Patient's wound is clean and granulating well.  No infection.  No need for any further debridement.  Continue dressing changes as ordered..  We will sign off for now.  Kathryne Eriksson. Dahlia Bailiff, MD, [place] [phone] Trauma Surgeon

## 2018-10-06 NOTE — Progress Notes (Signed)
STROKE TEAM PROGRESS NOTE   SUBJECTIVE (INTERVAL HISTORY) No family is at bedside.  Patient lying in bed comfortably.  Trach secretion much decreased this morning.  Trauma surgery signed off for abdominal wounds.  Anemia and leukocytosis improved.  OBJECTIVE Vitals:   10/06/18 1120 10/06/18 1200 10/06/18 1524 10/06/18 1600  BP: (!) 148/73 140/74 140/74 (!) 142/68  Pulse: 73 81 85 83  Resp: 19 (!) 8 (!) 21 (!) 30  Temp: 98.4 F (36.9 C)   98.3 F (36.8 C)  TempSrc:      SpO2: 100% 100% 100% 100%  Weight:      Height:        CBC:  Recent Labs  Lab 10/03/18 0651 10/06/18 0534  WBC 14.0* 12.8*  HGB 9.4* 9.1*  HCT 32.6* 31.6*  MCV 87.4 87.1  PLT 426* 483*    Basic Metabolic Panel:  Recent Labs  Lab 10/03/18 0651 10/06/18 0534  NA 142 143  K 4.2 4.3  CL 111 109  CO2 25 26  GLUCOSE 137* 170*  BUN 24* 27*  CREATININE 0.56 0.79  CALCIUM 10.8* 11.1*    Lipid Panel:     Component Value Date/Time   CHOL 256 (H) 08/15/2018 0512   TRIG 248 (H) 08/15/2018 0512   HDL 35 (L) 08/15/2018 0512   CHOLHDL 7.3 08/15/2018 0512   VLDL 50 (H) 08/15/2018 0512   LDLCALC 171 (H) 08/15/2018 0512   HgbA1c:  Lab Results  Component Value Date   HGBA1C 11.4 (H) 08/15/2018   Urine Drug Screen:     Component Value Date/Time   LABOPIA NONE DETECTED 08/30/2018 1836   COCAINSCRNUR NONE DETECTED 08/24/2018 1836   LABBENZ NONE DETECTED 08/29/2018 1836   AMPHETMU NONE DETECTED 08/10/2018 1836   THCU NONE DETECTED 08/10/2018 1836   LABBARB NONE DETECTED 08/27/2018 1836    Alcohol Level No results found for: ETH  IMAGING  Ct Angio Head W Or Wo Contrast Ct Angio Neck W Or Wo Contrast 08/15/2018 IMPRESSION:  1. Interval basilar to left proximal PCA stenting. The density of the stent walls precludes detection of in stent stenosis; there is a degree of wasting at the mid basilar segment. There is flow in the left PCA beyond the stent such that there is presumed stent patency.  2.  Known lower pontine infarct. There are new small cerebellar infarcts since brain MRI yesterday.  3. Severe left P2 segment stenosis, also seen previously.  4. Moderate atheromatous narrowing in the proximal left MCA.    Ct Angio Head W Or Wo Contrast Ct Angio Neck W Or Wo Contrast 08/18/2018 IMPRESSION:  1. 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 No definite acute thrombus identified.  2. Severe stenosis left posterior cerebral artery and moderate stenosis right posterior cerebral artery.  3. Mild stenosis left MCA and moderate stenosis left MCA bifurcation.  4. No significant carotid or vertebral artery stenosis in the neck.    Ct Head Wo Contrast 08/13/2018 IMPRESSION:  No acute intracranial abnormalities. Mild white matter changes likely due to small vessel ischemia.    Mr Brain Wo Contrast 08/22/2018 IMPRESSION:  1. Acute nonhemorrhagic infarct involving the left paramedian brainstem. The infarct crosses midline.  2. Other periventricular and subcortical white matter disease is moderately advanced for age. This likely reflects the sequela of chronic microvascular ischemia.  3. Tapering of the dens with prominent soft tissue pannus. 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  Ct Head Code Stroke Wo Contrast 08/17/2018  IMPRESSION:  1. No acute abnormality and no change from earlier today  2. ASPECTS is 10 3.    Cerebral Angiogram 09/04/2018 S/P 4 vessel cerebral arteriogram RT CFA approach. 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 S/P stent assisted angioplasty of distal basilar artery with patency of 80 to 90 %. 2.Approx 70 % stenosis of LT ICA supraclinoid seg   MRI 08/15/18 1. Progressive acute pontine infarct. New patchy bilateral cerebellar infarction. New tiny left thalamic infarcts. 2.  Preserved flow void in the stented basilar. 3. left facial/submandibular edema, please correlate with neck exam. Edited result: IMPRESSION: 1. Progressive acute pontine infarct. New patchy bilateral cerebellar infarction. New tiny left thalamic infarcts. 2. Preserved flow void in the stented basilar.  TTE - Left ventricle: The cavity size was normal. There was severe   concentric hypertrophy. Systolic function was normal. The   estimated ejection fraction was in the range of 60% to 65%. Wall   motion was normal; there were no regional wall motion   abnormalities. Doppler parameters are consistent with abnormal   left ventricular relaxation (grade 1 diastolic dysfunction). The   E/e&' ratio is <8, suggesting normal LV filling pressure. - Aortic valve: Trileaflet; mildly calcified leaflets.   Transvalvular velocity was minimally increased. There was no   regurgitation. Mean gradient (S): 12 mm Hg. - Mitral valve: Mildly thickened leaflets . There was trivial   regurgitation. - Left atrium: The atrium was normal in size. - Inferior vena cava: The vessel was dilated. The respirophasic   diameter changes were blunted (< 50%), consistent with elevated   central venous pressure. Impressions: - LVEF 60-65%, severe LVH, normal wall motion, grade 1 DD, normal   LV filling pressure, minimally increased aortic velocity without   signficant stenosis, trivial MR, normal LA size, dilated IVC.  CT abdomen and pelvis 09/03/18 Mild bilateral posterior basilar subsegmental atelectasis. Interval placement of new gastrostomy tube with tip and balloon within gastric lumen. Surgical drain is seen in the soft tissues in previous gastrostomy site. Small amount of gas and stranding is seen in the peritoneal fat anterior and inferior to the stomach which most likely is related to gastrostomy placement. Probable surgical wound seen in right upper quadrant of the anterior abdominal wall. 8.2 x 2.3 cm gas  and fluid collection is seen in the anterior abdominal wall which is unchanged compared to prior exam. Possible abscess cannot be excluded. Enlarged fibroid uterus.  PHYSICAL EXAM  Temp:  [98.1 F (36.7 C)-98.9 F (37.2 C)] 98.3 F (36.8 C) (12/02 1600) Pulse Rate:  [64-85] 83 (12/02 1600) Resp:  [0-30] 30 (12/02 1600) BP: (124-157)/(59-75) 142/68 (12/02 1600) SpO2:  [100 %] 100 % (12/02 1600) FiO2 (%):  [21 %] 21 % (12/02 1600)  General -  Obese middle-aged African-American lady, on trach collar, not in distress. She has abdominal surgical wound and drainage from the abdominal wall, dressing clean  Ophthalmologic - fundi not visualized due to noncooperation.  Cardiovascular - regular rate and rhythm  Neuro - on trach collar, eyes open, awake, alert. No ptosis or EOMI, denies diplopia.  PERRL. Bilateral facial weakness, tongue protrusion weak. Able to wiggle left hand fingers and left foot toes. 2-/5 LLE and 2/5 LUE. Follows commands and good eye contact.  Speech is moderate dysarthric with speaking valve.   Dense right hemiplegia  persists.  ASSESSMENT/PLAN Andrea [name] is a 57 y.o. female with history of difficult to control hypertension, insulin-dependent diabetes, obesity, hypothyroidism status post ablation presenting with chest discomfort, right-sided weakness, slurred speech and right facial droop. She did not receive IV t-PA due to late presentation. S/P stent assisted angioplasty of distal basilar artery.  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 Worsening symptoms with extension of pontine/medullary infarcts with new small bilateral cerebellar infarcts without evidence of stent re-stenosis or occlusion  Resultant quadriplegia, on trach and peg  CT head - No acute intracranial abnormalities.  CTA H&N 09/04/2018 - Extensive atherosclerotic disease in the basilar with focal critical stenosis in the mid to distal basilar. No  definite acute thrombus identified. Severe stenosis left posterior cerebral artery  MRI head - Acute nonhemorrhagic infarct involving the left paramedian brainstem.   CTA H&N 08/15/2018 - stent too dense to see BA lumen, but distal flow preserved. New small cerebellar infarcts since brain MRI yesterday.  Severe left P2 segment stenosis, also seen previously.   Repeat MRI - extension of b/l pontine and upper medullary infarcts with new b/l cerebellar infarcts.  2D Echo - EF 60-65%  LDL - 174  HgbA1c - 11.1  VTE prophylaxis - heparin subq  aspirin 81 mg daily prior to admission, now resumed on Brilinta 90 mg bid and ASA 81 mg daily.   Patient counseled to be compliant with her antithrombotic medications  Ongoing aggressive stroke risk factor management  Therapy recommendations:  SNF  Disposition:  palliative care service has discussed with pt using speaking valve and clearly pt would like full aggressive care. SW still working on the placement, no offer yet.    Abnormal uterine bleeding, improved  Has been following with GYN  Was on megace in the past  CT abd/pelvis 09/03/18 - enlarged fibroid uterus  continue megace 80mg  bid  Continue ASA and brilinta for now  OBGYN recs appreciated  Received prmarin 25mg  IV once - vaginal bleeding much improved  Close CBC monitoring  Anemia, improving  Hb 6.8->PRBC->7.1->9.0-7.7->7.0->PRBC->8.6->8.5-> 9.1  Likely due to iron deficiency, heavy periods and acute uterine bleeding and abdominal wall surgeries  Iron panel showed iron deficiency  On iron solutions  PRBC transfusion 1 U 09/08/2018 and 2U 09/07/18 and 2U 09/21/18  CBC monitoring  Intracranial stenosis  BA mid to distal severe stenosis s/p BA stenting  Left PCA severe stenosis, R PCA moderate stenosis  Left MCA moderate stenosis  Uncontrolled stroke risk factors with HLD, HTN, DM  Non compliance with meds at home  Respiratory failure  S/p trach  08/11/2018  On trach collar now  Able to speak with speaking valve although dysarthric  Trach secretion now again decreasing   Will discussed with CCM and considering decannulation of the trach  Will follow up with CCM and RT   Hypertension  Stable so far  BP goal 130-150   On Coreg 25 bid, amlodipine and hydralazine  Close monitoring  Hyperlipidemia  Lipid lowering medication PTA:  Lipitor 20 mg daily  LDL 174, goal < 70  Now on Lipitor 80 mg daily  Continue statin at discharge  Diabetes  HgbA1c 11.4, goal < 7.0  Uncontrolled at home  Glucose stable after insulin adjustment  Decrease Lantus again today to 15U bid   Decrease basal NovoLog again today to 3U Q4h  SSI  CBG monitoring  Low grade fever with leukocytosis, resolved  Tmax 101.6 -> afebrile  Leukocytosis - 9.1->8.5->11.9->14.0->12.8  UA WBC 21-50  CXR unremarkable, improving from prior  Blood cultures negative 10/26   Respiratory cultures - mixed flora   Abdominal abscess culture showed proteus mirabilis  Off antibiotics - completed course  Hypernatremia -> hyponatremia-> resolved  Na 143 now  On TF @ 65  on NS @ 20  Continue BMP monitoring  Dysphagia s/p PEG complicated by abdominal wall abscess  Continue tube feeding @ 45 cc/h  PEG done 08/27/2018  Removed and replaced dislodged PEG 08/18/2018  CT showed Abdominal wall abscess - s/p open surgical drainage 08/12/2018 persistent foul odor discharge. Reexploration 09/10/2018  Culture showed peroteus mirabilis  Off meropenum  Wound debrided appropriately and sutures removed.  No evisceration  Trauma surgery now sign off  Diarrhea, resolved  Likely related to TF  Dietitian has made formula change  Laxatives discontinued  Imodium as needed  Other Stroke Risk Factors  Obesity, Body mass index is 46.55 kg/m., recommend weight loss, diet and exercise as appropriate   Other Active Problems  Hypokalemia -  resolved  Thrombocytosis - likely due to anemia, now at [facility] day # 74  Andrea Hawking, MD PhD Stroke Neurology 10/06/2018 5:48 PM   To contact Stroke Continuity provider, please refer to [email or web address] After hours, contact General Neurology

## 2018-10-06 NOTE — Progress Notes (Signed)
Nutrition Follow-up  DOCUMENTATION CODES:   Morbid obesity  INTERVENTION:  Continue Vital AF 1.2 formula at goal rate of 65 ml/hr via PEG.  Tube feeding to provide 1872 kcal, 117 grams of protein, and 1264 ml water.   Once IV fluids are discontinued, recommend free water flushes of 150 ml TID.   NUTRITION DIAGNOSIS:   Inadequate oral intake related to inability to eat as evidenced by NPO status; ongoing  GOAL:   Patient will meet greater than or equal to 90% of their needs; met with TF  MONITOR:   Diet advancement, Labs, Weight trends, Skin, I & O's  REASON FOR ASSESSMENT:   Consult Enteral/tube feeding initiation and management  ASSESSMENT:   Ms. Mcglade is a 57 yo female with PMH of type 2 diabetes, HTN, CKD III, obesity, and anemia admitted for chest pain. Code Stroke called 10/10 in AM. 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 Intubated 10/10 in ICU.  10/21 trach and PEG. 11/1 ex lap, I&D.  Pt has been tolerating her tube feedings well. Weight ahas been fluctuating, however stable. CSW continues to work on facility placement. RD to continue with current tube feeding orders. Recommend free water flushes once IV fluids are discontinued. Labs and medications reviewed.   Diet Order:   Diet Order            Diet NPO time specified  Diet effective ____              EDUCATION NEEDS:   Not appropriate for education at this time  Skin:  Skin Assessment: Skin Integrity Issues: Skin Integrity Issues:: Incisions Stage II: healed Incisions: abdomen  Last BM:  12/2  Height:   Ht Readings from Last 1 Encounters:  09/03/18 _0  (1.626 m)    Weight:   Wt Readings from Last 1 Encounters:  09/19/18 123 kg    Ideal Body Weight:  54.5 kg  BMI:  Body mass index is 46.55 kg/m.  Estimated Nutritional Needs:   Kcal:  1600-1800  Protein:  120-135 grams  Fluid:  > 1.6 L    Corrin Parker, MS, RD, LDN Pager # [phone] After hours/ weekend pager #  [phone]

## 2018-10-07 DIAGNOSIS — R0902 Hypoxemia: Secondary | ICD-10-CM

## 2018-10-07 LAB — GLUCOSE, CAPILLARY
Glucose-Capillary: 133 mg/dL — ABNORMAL HIGH (ref 70–99)
Glucose-Capillary: 104 mg/dL — ABNORMAL HIGH (ref 70–99)
Glucose-Capillary: 125 mg/dL — ABNORMAL HIGH (ref 70–99)
Glucose-Capillary: 160 mg/dL — ABNORMAL HIGH (ref 70–99)
Glucose-Capillary: 125 mg/dL — ABNORMAL HIGH (ref 70–99)
Glucose-Capillary: 120 mg/dL — ABNORMAL HIGH (ref 70–99)

## 2018-10-07 NOTE — Progress Notes (Signed)
  Speech Language Pathology Treatment: Dysphagia;Passy Muir Speaking valve  Patient Details Name: Andrea [name] MRN: [id number] DOB: 15-Dec-1960 Today's Date: 10/07/2018 Time: 8563-1497 SLP Time Calculation (min) (ACUTE ONLY): 23 min  Assessment / Plan / Recommendation Clinical Impression  Pt was seen for skilled ST targeting goals for dysphagia and communication.  Pt wearing valve upon therapist's arrival, vital signs all WFL.  Pt drowsy but easily awakened and able to fully participate in therapy.  With valve in place, pt's voice remained hoarse, weak, and with low volume.  Pt needed min-mod cues to slow rate and increase vocal intensity to achieve intelligibility at the phrase level.  Pt had copious amounts of dry skin around lips and thick white coating on tongue.  Thorough oral care was completed prior to PO trials.  Pt consumed purees and teaspoons of honey thick liquids with no overt s/s of aspiration with purees or thickened liquids; however, pt did exhibit multiple swallows with pureed consistencies.  Pt was able to complete 2 sets of 5 repetitions of RMT before reporting she was "tapping out" due to fatigue.  She required max cues to coordinate breath cycles with device use.  Pt was left in bed with bed alarm set and call bell within reach.    HPI HPI: 57 yo presented to ED with chest pain noted Rt hemiparesis in ED with acute Left paramedian brainstem infarct. Intubated 10/10 for angioplasty, extubated post procedure and reintubated. Repeat MRI with brainstem infarct extension and bil cerebellar infarcts. Peg/trach 09/02/2018. FEES 09/15/18, 09/24/18. PMHx: HTN, DM, CKD      SLP Plan  Continue with current plan of care       Recommendations  Diet recommendations: NPO Liquids provided via: (during trials with SLP) Medication Administration: Via alternative means Postural Changes and/or Swallow Maneuvers: Seated upright 90 degrees;Upright 30-60 min after meal      Patient may use  Passy-Muir Speech Valve: During all therapies with supervision;Intermittently with supervision PMSV Supervision: Intermittent         Oral Care Recommendations: Oral care QID Follow up Recommendations: Skilled Nursing facility SLP Visit Diagnosis: Dysphagia, oropharyngeal phase (R13.12);Aphonia (R49.1) Plan: Continue with current plan of care       GO                Khushbu Pippen, Selinda Orion 10/07/2018, 12:00 PM

## 2018-10-07 NOTE — Progress Notes (Signed)
Occupational Therapy Treatment Patient Details Name: Andrea [name] MRN: [id number] DOB: 11-30-1960 Today's Date: 10/07/2018    History of present illness 57 yo presented to ED with chest pain noted Rt hemiparesis in ED with acute Left paramedian brainstem infarct. Intubated 10/10 for angioplasty, extubated post procedure and reintubated. Repeat MRI with brainstem infarct extension and bil cerebellar infarcts. Peg/trach with return to vent 08/24/2018. 10/22-29 on trach collar, return to vent 10/29. 10/28 I & D of abdominal wall abscess with placement of penrose drain and G-tube, 11/1 repeat ex lap. 11/7 return to trach collar.  PMHx: HTN, DM, CKD   OT comments  Patient supine in bed and agreeable to OT/PT session.  Patient continues to require total assist for bed mobility, sitting EOB with max assist overall (mod assist at times with active support of L UE) and intermittent trunk extension/activation.  Patient fatigues easily with sitting EOB but motivated.  Max assist to utilized L UE for washing face and B UEs to wash hands with hand over hand support.  Will continue to follow.    Follow Up Recommendations  SNF;Supervision/Assistance - 24 hour    Equipment Recommendations  Other (comment)(TBD )    Recommendations for Other Services      Precautions / Restrictions Precautions Precautions: Fall Precaution Comments: Peg/trach, abdominal binder  Restrictions Weight Bearing Restrictions: No       Mobility Bed Mobility Overal bed mobility: Needs Assistance Bed Mobility: Supine to Sit;Sit to Supine;Rolling Rolling: Total assist;+2 for physical assistance   Supine to sit: Total assist;+2 for physical assistance Sit to supine: Total assist;+2 for physical assistance   General bed mobility comments: Total assist to support trunk and progress bil LEs to EOB.    Transfers                      Balance Overall balance assessment: Needs assistance Sitting-balance support:  Feet supported;Bilateral upper extremity supported Sitting balance-Leahy Scale: Poor Sitting balance - Comments: EOB with overall max assist to maintain balance, but demonstrating increased functional support of R UE in extension at times mod assist and noted trunkal activation and extension at EOB  Postural control: Right lateral lean;Posterior lean                                 ADL either performed or assessed with clinical judgement   ADL Overall ADL's : Needs assistance/impaired Eating/Feeding: Total assistance Eating/Feeding Details (indicate cue type and reason): ice chips with PMSV Grooming: Maximal assistance;Wash/dry hands;Wash/dry face Grooming Details (indicate cue type and reason): Hand over hand to wash face and hands with max assist while sitting EOB                                General ADL Comments: overall total assist for ADLs      Vision       Perception     Praxis      Cognition Arousal/Alertness: Awake/alert Behavior During Therapy: Anxious Overall Cognitive Status: Impaired/Different from baseline Area of Impairment: Awareness;Attention;Safety/judgement                   Current Attention Level: Selective Memory: Decreased short-term memory Following Commands: Follows one step commands consistently Safety/Judgement: Decreased awareness of safety;Decreased awareness of deficits Awareness: Emergent Problem Solving: Slow processing  Exercises General Exercises - Upper Extremity Shoulder Flexion: AAROM;PROM;10 reps;Supine;Both(AAROM L, PROM R ) Shoulder Horizontal ABduction: PROM;AROM;Both;Seated;10 reps(AAROM L, PROM R ) Shoulder Horizontal ADduction: AAROM;PROM;Both;10 reps;Supine(PROM R, AAROM L ) Elbow Flexion: AAROM;PROM;Both;10 reps;Supine(AAROM L, PROM R ) Elbow Extension: AAROM;PROM;Both;10 reps;Supine(AAROM L, PROM R ) Wrist Flexion: AAROM;PROM;Both;10 reps;Supine(AAROM L, PROM R ) Wrist  Extension: PROM;AAROM;Both;10 reps;Supine(AAROM L, PROM R ) Digit Composite Flexion: AAROM;PROM;Both;10 reps;Supine(AAROM L, PROM R ) Composite Extension: PROM;AAROM;Both;10 reps;Supine(AAROM L, PROM R ) Other Exercises Other Exercises: weightbearing through L UE hand to facilitate to midline sitting EOB    Shoulder Instructions       General Comments PMV on during session, removed at completion     Pertinent Vitals/ Pain       Pain Assessment: Faces Faces Pain Scale: Hurts even more Pain Location: back Pain Descriptors / Indicators: Discomfort;Grimacing Pain Intervention(s): Monitored during session;Repositioned  Home Living                                          Prior Functioning/Environment              Frequency  Min 2X/week        Progress Toward Goals  OT Goals(current goals can now be found in the care plan section)  Progress towards OT goals: Progressing toward goals  Acute Rehab OT Goals Patient Stated Goal: to get stronger OT Goal Formulation: With patient/family Time For Goal Achievement: 10/20/18 Potential to Achieve Goals: [place] Discharge plan remains appropriate    Co-evaluation    PT/OT/SLP Co-Evaluation/Treatment: Yes Reason for Co-Treatment: Complexity of the patient's impairments (multi-system involvement);Necessary to address cognition/behavior during functional activity;For patient/therapist safety;To address functional/ADL transfers   OT goals addressed during session: ADL's and self-care;Strengthening/ROM      AM-PAC OT "6 Clicks" Daily Activity     Outcome Measure   Help from another person eating meals?: Total Help from another person taking care of personal grooming?: A Lot Help from another person toileting, which includes using toliet, bedpan, or urinal?: Total Help from another person bathing (including washing, rinsing, drying)?: Total Help from another person to put on and taking off regular upper  body clothing?: Total Help from another person to put on and taking off regular lower body clothing?: Total 6 Click Score: 7    End of Session Equipment Utilized During Treatment: Oxygen  OT Visit Diagnosis: Other abnormalities of gait and mobility (R26.89);Muscle weakness (generalized) (M62.81);Low vision, both eyes (H54.2);Feeding difficulties (R63.3);Other symptoms and signs involving cognitive function;Hemiplegia and hemiparesis;Pain Hemiplegia - Right/Left: Right Hemiplegia - dominant/non-dominant: Non-Dominant Hemiplegia - caused by: Nontraumatic intracerebral hemorrhage;Cerebral infarction Pain - part of body: (back)   Activity Tolerance Patient tolerated treatment well   Patient Left in bed;with call bell/phone within reach   Nurse Communication Mobility status        Time: 9233-0076 OT Time Calculation (min): 43 min  Charges: OT General Charges $OT Visit: 1 Visit OT Treatments $Self Care/Home Management : 8-22 mins $Therapeutic Activity: 8-22 mins  Delight Stare, [place] Pager [phone] Office [phone]    Delight Stare 10/07/2018, 5:22 PM

## 2018-10-07 NOTE — Social Work (Signed)
Pt still without any [facility] offers, CSW discussed case during quality collaborative meeting with Surveyor, quantity Nathaniel Man, encouraged to send referral to [employer] as they are able to manage pts with trach care. Referral sent to liaison Clarene Critchley at [facility].  Continuing to follow.   Westley Hummer, MSW, [place] Work [phone]

## 2018-10-07 NOTE — Progress Notes (Signed)
STROKE TEAM PROGRESS NOTE   SUBJECTIVE (INTERVAL HISTORY) No family is at bedside.  Patient lying in bed comfortably.  Has speaking valve in place this morning.  CCM on board, not candidate for decannulation due to weak cough on speech evaluation 09/29/2018.  Trach secretion much decreased.    OBJECTIVE Vitals:   10/07/18 0300 10/07/18 0400 10/07/18 0700 10/07/18 0856  BP:  (!) 157/83 (!) 147/78 (!) 141/70  Pulse: 80 80 80 81  Resp: 16 15 15 16   Temp:  98.7 F (37.1 C) 98.2 F (36.8 C)   TempSrc:  Oral Oral   SpO2: 100% 100% 100% 100%  Weight:      Height:        CBC:  Recent Labs  Lab 10/03/18 0651 10/06/18 0534  WBC 14.0* 12.8*  HGB 9.4* 9.1*  HCT 32.6* 31.6*  MCV 87.4 87.1  PLT 426* 483*    Basic Metabolic Panel:  Recent Labs  Lab 10/03/18 0651 10/06/18 0534  NA 142 143  K 4.2 4.3  CL 111 109  CO2 25 26  GLUCOSE 137* 170*  BUN 24* 27*  CREATININE 0.56 0.79  CALCIUM 10.8* 11.1*    Lipid Panel:     Component Value Date/Time   CHOL 256 (H) 08/15/2018 0512   TRIG 248 (H) 08/15/2018 0512   HDL 35 (L) 08/15/2018 0512   CHOLHDL 7.3 08/15/2018 0512   VLDL 50 (H) 08/15/2018 0512   LDLCALC 171 (H) 08/15/2018 0512   HgbA1c:  Lab Results  Component Value Date   HGBA1C 11.4 (H) 08/15/2018   Urine Drug Screen:     Component Value Date/Time   LABOPIA NONE DETECTED 08/20/2018 1836   COCAINSCRNUR NONE DETECTED 08/08/2018 1836   LABBENZ NONE DETECTED 08/08/2018 1836   AMPHETMU NONE DETECTED 08/23/2018 1836   THCU NONE DETECTED 08/20/2018 1836   LABBARB NONE DETECTED 09/03/2018 1836    Alcohol Level No results found for: ETH  IMAGING  Ct Angio Head W Or Wo Contrast Ct Angio Neck W Or Wo Contrast 08/15/2018 IMPRESSION:  1. Interval basilar to left proximal PCA stenting. The density of the stent walls precludes detection of in stent stenosis; there is a degree of wasting at the mid basilar segment. There is flow in the left PCA beyond the stent such that  there is presumed stent patency.  2. Known lower pontine infarct. There are new small cerebellar infarcts since brain MRI yesterday.  3. Severe left P2 segment stenosis, also seen previously.  4. Moderate atheromatous narrowing in the proximal left MCA.    Ct Angio Head W Or Wo Contrast Ct Angio Neck W Or Wo Contrast 08/23/2018 IMPRESSION:  1. 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 No definite acute thrombus identified.  2. Severe stenosis left posterior cerebral artery and moderate stenosis right posterior cerebral artery.  3. Mild stenosis left MCA and moderate stenosis left MCA bifurcation.  4. No significant carotid or vertebral artery stenosis in the neck.    Ct Head Wo Contrast 08/21/2018 IMPRESSION:  No acute intracranial abnormalities. Mild white matter changes likely due to small vessel ischemia.    Mr Brain Wo Contrast 09/01/2018 IMPRESSION:  1. Acute nonhemorrhagic infarct involving the left paramedian brainstem. The infarct crosses midline.  2. Other periventricular and subcortical white matter disease is moderately advanced for age. This likely reflects the sequela of chronic microvascular ischemia.  3. Tapering of the dens with prominent soft tissue  pannus. 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    Ct Head Code Stroke Wo Contrast 08/10/2018  IMPRESSION:  1. No acute abnormality and no change from earlier today  2. ASPECTS is 10 3.    Cerebral Angiogram 08/13/2018 S/P 4 vessel cerebral arteriogram RT CFA approach. 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 S/P stent assisted angioplasty of distal basilar artery with patency of 80 to 90 %. 2.Approx 70 % stenosis of LT ICA supraclinoid seg   MRI 08/15/18 1. Progressive acute pontine infarct. New patchy bilateral cerebellar  infarction. New tiny left thalamic infarcts. 2. Preserved flow void in the stented basilar. 3. left facial/submandibular edema, please correlate with neck exam. Edited result: IMPRESSION: 1. Progressive acute pontine infarct. New patchy bilateral cerebellar infarction. New tiny left thalamic infarcts. 2. Preserved flow void in the stented basilar.  TTE - Left ventricle: The cavity size was normal. There was severe   concentric hypertrophy. Systolic function was normal. The   estimated ejection fraction was in the range of 60% to 65%. Wall   motion was normal; there were no regional wall motion   abnormalities. Doppler parameters are consistent with abnormal   left ventricular relaxation (grade 1 diastolic dysfunction). The   E/e&' ratio is <8, suggesting normal LV filling pressure. - Aortic valve: Trileaflet; mildly calcified leaflets.   Transvalvular velocity was minimally increased. There was no   regurgitation. Mean gradient (S): 12 mm Hg. - Mitral valve: Mildly thickened leaflets . There was trivial   regurgitation. - Left atrium: The atrium was normal in size. - Inferior vena cava: The vessel was dilated. The respirophasic   diameter changes were blunted (< 50%), consistent with elevated   central venous pressure. Impressions: - LVEF 60-65%, severe LVH, normal wall motion, grade 1 DD, normal   LV filling pressure, minimally increased aortic velocity without   signficant stenosis, trivial MR, normal LA size, dilated IVC.  CT abdomen and pelvis 09/03/18 Mild bilateral posterior basilar subsegmental atelectasis. Interval placement of new gastrostomy tube with tip and balloon within gastric lumen. Surgical drain is seen in the soft tissues in previous gastrostomy site. Small amount of gas and stranding is seen in the peritoneal fat anterior and inferior to the stomach which most likely is related to gastrostomy placement. Probable surgical wound seen in right upper quadrant of  the anterior abdominal wall. 8.2 x 2.3 cm gas and fluid collection is seen in the anterior abdominal wall which is unchanged compared to prior exam. Possible abscess cannot be excluded. Enlarged fibroid uterus.  PHYSICAL EXAM  Temp:  [98.2 F (36.8 C)-99.7 F (37.6 C)] 98.2 F (36.8 C) (12/03 0700) Pulse Rate:  [74-85] 81 (12/03 0856) Resp:  [0-30] 16 (12/03 0856) BP: (140-157)/(68-90) 141/70 (12/03 0856) SpO2:  [99 %-100 %] 100 % (12/03 0856) FiO2 (%):  [21 %] 21 % (12/03 0856)  General -  Obese middle-aged African-American lady, on trach collar, not in distress. She has abdominal surgical wound and drainage from the abdominal wall, dressing clean  Ophthalmologic - fundi not visualized due to noncooperation.  Cardiovascular - regular rate and rhythm  Neuro - on trach collar, eyes open, awake, alert. No ptosis or EOMI, denies diplopia.  PERRL. Bilateral facial weakness, tongue protrusion weak. Able to wiggle left hand fingers and left foot toes. 2-/5 LLE and 2/5 LUE. Follows commands and good eye contact.  Speech is  moderate dysarthric with speaking valve.   Dense right hemiplegia persists.  ASSESSMENT/PLAN Ms. JONE PANEBIANCO is a 57 y.o. female with history of difficult to control hypertension, insulin-dependent diabetes, obesity, hypothyroidism status post ablation presenting with chest discomfort, right-sided weakness, slurred speech and right facial droop. She did not receive IV t-PA due to late presentation. S/P stent assisted angioplasty of distal basilar artery.  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 Worsening symptoms with extension of pontine/medullary infarcts with new small bilateral cerebellar infarcts without evidence of stent re-stenosis or occlusion  Resultant quadriplegia, on trach and peg  CT head - No acute intracranial abnormalities.  CTA H&N 08/21/2018 - Extensive atherosclerotic disease in the basilar with  focal critical stenosis in the mid to distal basilar. No definite acute thrombus identified. Severe stenosis left posterior cerebral artery  MRI head - Acute nonhemorrhagic infarct involving the left paramedian brainstem.   CTA H&N 08/15/2018 - stent too dense to see BA lumen, but distal flow preserved. New small cerebellar infarcts since brain MRI yesterday.  Severe left P2 segment stenosis, also seen previously.   Repeat MRI - extension of b/l pontine and upper medullary infarcts with new b/l cerebellar infarcts.  2D Echo - EF 60-65%  LDL - 174  HgbA1c - 11.1  VTE prophylaxis - heparin subq  aspirin 81 mg daily prior to admission, now resumed on Brilinta 90 mg bid and ASA 81 mg daily.   Patient counseled to be compliant with her antithrombotic medications  Ongoing aggressive stroke risk factor management  Therapy recommendations:  SNF  Disposition:  palliative care service has discussed with pt using speaking valve and clearly pt would like full aggressive care. SW still working on the placement, no offer yet.    Abnormal uterine bleeding, improved  Has been following with GYN  Was on megace in the past  CT abd/pelvis 09/03/18 - enlarged fibroid uterus  continue megace 80mg  bid  Continue ASA and brilinta for now  OBGYN recs appreciated  Received prmarin 25mg  IV once - vaginal bleeding much improved  Close CBC monitoring  Anemia, improving  Hb 6.8->PRBC->7.1->9.0-7.7->7.0->PRBC->8.6->8.5-> 9.1  Likely due to iron deficiency, heavy periods and acute uterine bleeding and abdominal wall surgeries  Iron panel showed iron deficiency  On iron solutions  PRBC transfusion 1 U 09/26/2018 and 2U 09/07/18 and 2U 09/21/18  CBC monitoring  Intracranial stenosis  BA mid to distal severe stenosis s/p BA stenting  Left PCA severe stenosis, R PCA moderate stenosis  Left MCA moderate stenosis  Uncontrolled stroke risk factors with HLD, HTN, DM  Non compliance with  meds at home  Respiratory failure  S/p trach 08/05/2018  On trach collar now  Able to speak with speaking valve although dysarthric  Trach secretion now again decreasing   Not a candidate for decannulation at this time due to weak cough on speech evaluation 09/29/2018  Will continue to follow up with CCM and RT   Hypertension  Stable so far  BP goal 130-150   On Coreg 25 bid, amlodipine and hydralazine  Close monitoring  Hyperlipidemia  Lipid lowering medication PTA:  Lipitor 20 mg daily  LDL 174, goal < 70  Now on Lipitor 80 mg daily  Continue statin at discharge  Diabetes  HgbA1c 11.4, goal < 7.0  Uncontrolled at home  Glucose stable after insulin adjustment  Decrease Lantus again today to 15U bid   Decrease basal NovoLog again today to 3U  Q4h  SSI  CBG monitoring  Low grade fever with leukocytosis, resolved  Tmax 101.6 -> afebrile  Leukocytosis - 9.1->8.5->11.9->14.0->12.8  UA WBC 21-50  CXR unremarkable, improving from prior  Blood cultures negative 10/26   Respiratory cultures - mixed flora   Abdominal abscess culture showed proteus mirabilis  Off antibiotics - completed course  Hypernatremia -> hyponatremia-> resolved  Na 143   On TF @ 65  on NS @ 20  Continue BMP monitoring  Dysphagia s/p PEG complicated by abdominal wall abscess  Continue tube feeding @ 45 cc/h  PEG done 09/02/2018  Removed and replaced dislodged PEG 08/09/2018  CT showed Abdominal wall abscess - s/p open surgical drainage 08/28/2018 persistent foul odor discharge. Reexploration 09/12/2018  Culture showed peroteus mirabilis  Off meropenum  Wound debrided appropriately and sutures removed.  No evisceration  Trauma surgery now sign off  Still has weak cough on speech eval 09/29/2018  Follow with speech evaluation for candidacy of decannulation of trach  Diarrhea, resolved  Likely related to TF  Dietitian has made formula change  Laxatives  discontinued  Imodium as needed  Other Stroke Risk Factors  Obesity, Body mass index is 46.55 kg/m., recommend weight loss, diet and exercise as appropriate   Other Active Problems  Hypokalemia - resolved  Thrombocytosis - likely due to anemia, now at [facility] day # 54  Rosalin Hawking, MD PhD Stroke Neurology 10/07/2018 11:54 AM   To contact Stroke Continuity provider, please refer to [email or web address] After hours, contact General Neurology

## 2018-10-07 NOTE — Progress Notes (Addendum)
NAME:  Andrea [name], MRN:  [id number], DOB:  April 06, 1961, LOS: 36 ADMISSION DATE:  08/24/2018, CONSULTATION DATE:  09/02/2018 REFERRING MD:  Dr. Rory Percy, CHIEF COMPLAINT:  CP/ Acute CVA  Brief History   57 yoF w/poorly controlled HTN and IDDM presenting with CP and left arm pain w/BP 220/117. Initial CTH neg.  Cardiac workup negative for acute event thus far, cardiology following.  Code stroke initiated for R hemiplegia, dysarthria and right facial droop.  Out of window for TPA.  Requiring cleviprex for BP control.  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  Evolving pontine stroke on MRI. Intubated 10/10 for neuro IR s/p stenting and angioplasty with 80-90% patency.  Extubated post procedure but due to insufficency placed on BiPAP and brought to ICU.  Required reintubation and ultimately trach (10/21 JY).  Peg tube placed 10/21 by CCS.  Taken to OR 10/28 for open gastrostomy and I&D of abdominal wall abscess. 11/1 taken back to OR for repeat ex-lap.     Past Medical History  HTN, IDDM, hypothyroidism s/p ablation  Significant Hospital Events   10/9 present to [facility] ED -transferred to [facility] 10/9 basilar artery stenting 10/9 intubated urgently 6h post procedure for inability to protect airway. 10/10 CTA for declining exam - evolving pontine infarct, stent deemed to be patent 10/14 Brillinta stopped and ASA/ heparin started to in anticipation of tracheostomy on Monday. 10/14 through 10/19: Neuro exam has been stable sodium increasing in spite of free water via tube, providing supportive care awaiting trach 10/20: Sodium down to 151 from 156 clinically looks the same 10/21: Sodium down to 148, sputum culture sent for purulent tracheal secretions, awaiting trach and PEG.  Purulent bronchial secretions from right upper lobe sent for BAL 10/22: Tracheostomy and PEG completed on the 21st tolerated well.  Spiking low-grade fever white cells continued to climb starting empiric  vancomycin and ceftaz.  Sodium improved down to 146.  Blood pressure improving but still not at goal added hydrochlorothiazide.  Resumed Brilinta.  10/23: Sodium normal.  Backing down on free water.  Glycemic control challenging, increased basal NovoLog in addition to Lantus and sliding scale.  Still spiking fever, white blood cell count climbing, sending blood cultures.  Foley catheter being placed for pressure ulcer caused by pure wick device 10/27 Foul smelling dark trach secretions, PEG secretions  10/28 to OR for ex lap due to PEG dislodgement.  PEG replaced 10/29 remains on MV 2/2 tachypnea; family meeting 10/30 no weaning 2/2 to ongoing tachypnea; foul smelling drainage from abd site 10/31 no weaning, abx changed, family meeting 11/1 repeat Ex-lap for I&D of absces 11/1 tolerated > 48h trach collar trial 11/7 on ATC since 11/12 trach changed 11/13 not tolerating PMV 11/15 developed heavy vaginal bleeding. GYN called. Recommended transvag US.  11/16 started on IV estrogen per recs of GYN, also told to cont megace. Vaginal bleeding improving 11/17 copious Trach secretions. Neuro added HT saline nebs got 2 units PRBC for hgb of 7 11/19 seen by SLP. Has made progress. Repeat FEEs ordered  Consults: date of consult/date signed off & final recs:  Cards 10/10 Neurology 10/10 PCCM 10/10 CCS 10/17  Procedures (surgical and bedside):  10/10 Neuro IR >> s/p stent and angioplasty of distal basilar artery  10/10 ETT for procedure 10/10 ETT (reintubated) >> removed 10/21 10/10 Size 6 tracheostomy placed by Dr. Nelda Marseille 10/26 changed to #4 trach cuffless  10/28 Ex lap with replacement of gastrostomy tube due  to dislodgement 11/1 repeat Ex-lap with I&D of abdominal wall abscess  11/12 trach change  Significant Diagnostic Tests:  10/10  CTH >>  No acute intracranial abnormalities. 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 No definite acute thrombus identified. Severe stenosis left posterior cerebral artery and moderate stenosis right posterior cerebral artery. Mild stenosis left MCA and moderate stenosis left MCA bifurcation. No significant carotid or vertebral artery stenosis in the neck. 10/10 MRI >> evolving pontine infarct. 10/11 ECHO >> normal LVSF with severe LVH. 10/28 CT A / P > dislodged PEG tube outside of the peritoneum and not within the stomach.  Cholelithiasis. No bowel obstruction.  No hydronephrosis.  10/30 CT abd/pelvis >> Mild bilateral atelectasis.  Interval placement of new gastrostomy tube with tip and balloon within gastric lumen. Surgical drain is seen in the soft tissues in previous gastrostomy site. Small amount of gas and stranding is seen in the peritoneal fat anterior and inferior to the stomach which most likely is related to gastrostomy placement. Probable surgical wound seen in right upper quadrant of the anterior abdominal wall.  8.2 x 2.3 cm gas and fluid collection is seen in the anterior abdominal wall which is unchanged compared to prior exam. Possible abscess cannot be excluded.  Enlarged fibroid uterus.  11/14 B LE venous dopplers  >>  Micro Data:  10/10 MRSA PCR >> negative 10/21 Sputum culture >> nl resp flora  10/23 Blood cultures x 2 >> negative 10/26 Blood cultures x 2 >> neg 10/28 MRSA PCR >> neg 10/27 Sputum culture >> normal flora 11/1 Abdominal abscess> kelbsiella, proteus, susceptible to imipenem, amp-sulbactam, ESBL  Antimicrobials:  10/10 cefazolin preop 10/22 Vancomycin >> 10/30 10/22 Ceftaz >> 10/31 10/31 ceftriaxone >> 11/4 10/31 flagyl >> 11/4 11/4 meropenem >> 11/9  Subjective:  No acute events.  Continues to tolerate ATC well on 28% FiO2.  Objective   Blood pressure (!) 141/70, pulse 81,  temperature 98.2 F (36.8 C), temperature source Oral, resp. rate 16, height _0  (1.626 m), weight 123 kg, last menstrual period 07/26/2018, SpO2 100 %.    FiO2 (%):  [21 %] 21 %   Intake/Output Summary (Last 24 hours) at 10/07/2018 1031 Last data filed at 10/07/2018 0900 Gross per 24 hour  Intake 390 ml  Output 901 ml  Net -511 ml   Filed Weights   09/17/18 0500 09/18/18 0500 09/19/18 0500  Weight: 128.4 kg 76 kg 123 kg   Examination:  General: 57 year old African-American female currently resting in bed she is in no acute distress, awake and alert, NAD HEENT: [place] / AT. size 4 cuffless tracheostomy is unremarkable, PMV in place.  Able to speak though phonation is weak.  Has significant oral thrush.  Weak cough. Pulmonary: Diminished bases no accessory use, few rhonchi throughout Cardiac: S1, S2, Regular rate and rhythm without rub, murmur gallop Abdomen: Soft nontender , ND, BS +, no organomegaly tolerating tube feeds Extremities: Warm dry + dependent edema,  foot drop boots in place Neuro: Awake, and alert,  follows commands, disoriented at times  Resolved problems  Acute respiratory failure abd wall abscess s/p exploratory lap I&D on 10/28 and repeat take back 11/1  Current problem list   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  HTN Neuropathic pain  Uterine bleeding Acute blood loss anemia  Wound care s/p I&D of abd wall abscess DM hypothyroidism  Current Pulmonary specific problem list    Tracheostomy dependence d/t ineffective airway clearance & poor cough mechanics after CVA - s/p JY trach 10/10 - on ATC since 11/6  -She continues to have weak cough mechanics per speech eval 11/25, not  a candidate for decannulation, and may not be for some time if at all Plan Tolerating 28% FiO2 well with SpO2 of  100, will attempt to wean down to room air.  If maintains sats, then continue Would be hesitant to decannulate given her hx of poor cough mechanics and secretions / plugging Mobilize as able PRN bronchodilators Continue routine trach care PCCM  will see weekly She will need follow-up in trach clinic at time of discharge  Dysphasia following CVA Failed swallow 11/25>> weak cough Plan Continue tube feeds SLP following  Oral Thrush Plan Continue  Nystatin mouthwash Frequent mouthcare   Rest per primary team.   Montey Hora, [facility] Pager: [phone]  or [phone] 10/07/2018, 10:43 AM  Attending Note:  57 year old female presenting with CVA and dysphagia that was trached in October.  Patient had no events overnight.  Continues to have dysphagia however.  Her cough mechanics remain poor and secretions are a decreasing issue.  Airway clearance will be a concern.  On exam, she has a weak cough with lungs with coarse BS diffusely.  I reviewed CXR myself, trach is in a good position.  Discussed with PCCM-NP.  Chronic respiratory failure:  - Continue pulmonary hygienes  - Suction as needed  - Encourage cough  Hypoxemia:  - Titrate O2 for sat of 88-92%  Trach status:  - Maintain current trach size and type  - Care per the trach team  - No decannulation given cough mechanics  PCCM will follow weakly for trach care.  Patient seen and examined, agree with above note.  I dictated the care and orders written for this patient under my direction.  Rush Farmer, [place]

## 2018-10-07 NOTE — Progress Notes (Signed)
Physical Therapy Treatment Patient Details Name: Andrea [name] MRN: [id number] DOB: Jan 07, 1961 Today's Date: 10/07/2018    History of Present Illness 57 yo presented to ED with chest pain noted Rt hemiparesis in ED with acute Left paramedian brainstem infarct. Intubated 10/10 for angioplasty, extubated post procedure and reintubated. Repeat MRI with brainstem infarct extension and bil cerebellar infarcts. Peg/trach with return to vent 08/12/2018. 10/22-29 on trach collar, return to vent 10/29. 10/28 I & D of abdominal wall abscess with placement of penrose drain and G-tube, 11/1 repeat ex lap. 11/7 return to trach collar.  PMHx: HTN, DM, CKD    PT Comments    Pt struggling to make slow gains overall.  Today she sat EOB for 10-15 min working up upright supported sitting on the LE.  Still needing max to total assist overall, but giving significant effort in sitting EOB   Follow Up Recommendations  SNF;Supervision/Assistance - 24 hour     Equipment Recommendations  Hospital bed;Wheelchair (measurements PT)    Recommendations for Other Services       Precautions / Restrictions Precautions Precautions: Fall Precaution Comments: Peg/trach, abdominal binder  Restrictions Weight Bearing Restrictions: No    Mobility  Bed Mobility Overal bed mobility: Needs Assistance Bed Mobility: Supine to Sit;Sit to Supine;Rolling Rolling: Total assist;+2 for physical assistance   Supine to sit: Total assist;+2 for physical assistance Sit to supine: Total assist;+2 for physical assistance   General bed mobility comments: Total assist to support trunk and progress bil LEs to EOB.    Transfers                    Ambulation/Gait                 Stairs             Wheelchair Mobility    Modified Rankin (Stroke Patients Only) Modified Rankin (Stroke Patients Only) Pre-Morbid Rankin Score: No symptoms Modified Rankin: Severe disability     Balance Overall balance  assessment: Needs assistance Sitting-balance support: Feet supported;Bilateral upper extremity supported Sitting balance-Leahy Scale: Poor Sitting balance - Comments: EOB with overall max assist to maintain balance, but demonstrating increased functional support of R UE in extension at times mod assist and noted trunkal activation and extension at EOB  Postural control: Right lateral lean;Posterior lean                                  Cognition Arousal/Alertness: Awake/alert Behavior During Therapy: Anxious Overall Cognitive Status: Impaired/Different from baseline Area of Impairment: Awareness;Attention;Safety/judgement                   Current Attention Level: Selective Memory: Decreased short-term memory Following Commands: Follows one step commands consistently Safety/Judgement: Decreased awareness of safety;Decreased awareness of deficits Awareness: Emergent Problem Solving: Slow processing        Exercises General Exercises - Upper Extremity Shoulder Flexion: AAROM;PROM;10 reps;Supine;Both(AAROM L, PROM R ) Shoulder Horizontal ABduction: PROM;AROM;Both;Seated;10 reps(AAROM L, PROM R ) Shoulder Horizontal ADduction: AAROM;PROM;Both;10 reps;Supine(PROM R, AAROM L ) Elbow Flexion: AAROM;PROM;Both;10 reps;Supine(AAROM L, PROM R ) Elbow Extension: AAROM;PROM;Both;10 reps;Supine(AAROM L, PROM R ) Wrist Flexion: AAROM;PROM;Both;10 reps;Supine(AAROM L, PROM R ) Wrist Extension: PROM;AAROM;Both;10 reps;Supine(AAROM L, PROM R ) Digit Composite Flexion: AAROM;PROM;Both;10 reps;Supine(AAROM L, PROM R ) Composite Extension: PROM;AAROM;Both;10 reps;Supine(AAROM L, PROM R ) Other Exercises Other Exercises: weightbearing through L UE hand to facilitate to midline sitting  EOB  Other Exercises: Bil ROM exercise/stretches prior to mobility    General Comments General comments (skin integrity, edema, etc.): PMV on during session and to get a few ice chips,  removed at  completion      Pertinent Vitals/Pain Pain Assessment: Faces Faces Pain Scale: Hurts even more Pain Location: back Pain Descriptors / Indicators: Discomfort;Grimacing Pain Intervention(s): Monitored during session    Home Living                      Prior Function            PT Goals (current goals can now be found in the care plan section) Acute Rehab PT Goals Patient Stated Goal: to get stronger PT Goal Formulation: With patient Time For Goal Achievement: 10/15/18 Potential to Achieve Goals: Fair Progress towards PT goals: Progressing toward goals    Frequency    Min 2X/week      PT Plan Current plan remains appropriate    Co-evaluation PT/OT/SLP Co-Evaluation/Treatment: Yes Reason for Co-Treatment: Complexity of the patient's impairments (multi-system involvement) PT goals addressed during session: Mobility/safety with mobility OT goals addressed during session: ADL's and self-care      AM-PAC PT "6 Clicks" Mobility   Outcome Measure  Help needed turning from your back to your side while in a flat bed without using bedrails?: Total Help needed moving from lying on your back to sitting on the side of a flat bed without using bedrails?: Total Help needed moving to and from a bed to a chair (including a wheelchair)?: Total Help needed standing up from a chair using your arms (e.g., wheelchair or bedside chair)?: Total Help needed to walk in hospital room?: Total Help needed climbing 3-5 steps with a railing? : Total 6 Click Score: 6    End of Session   Activity Tolerance: Patient tolerated treatment well Patient left: in bed;with call bell/phone within reach Nurse Communication: Mobility status;Need for lift equipment PT Visit Diagnosis: Other abnormalities of gait and mobility (R26.89);Muscle weakness (generalized) (M62.81);Other symptoms and signs involving the nervous system (R29.898);Hemiplegia and hemiparesis Hemiplegia - Right/Left:  Right Hemiplegia - dominant/non-dominant: Dominant Hemiplegia - caused by: Cerebral infarction     Time: 9449-6759 PT Time Calculation (min) (ACUTE ONLY): 43 min  Charges:  $Therapeutic Activity: 8-22 mins                     10/07/2018  Donnella Sham, PT Acute Rehabilitation Services [phone]  (pager) [phone]  (office)   Tessie Fass Jasmen Emrich 10/07/2018, 5:55 PM

## 2018-10-08 LAB — BASIC METABOLIC PANEL
Anion gap: 7 (ref 5–15)
BUN: 31 mg/dL — ABNORMAL HIGH (ref 6–20)
CO2: 25 mmol/L (ref 22–32)
Calcium: 11.1 mg/dL — ABNORMAL HIGH (ref 8.9–10.3)
Chloride: 113 mmol/L — ABNORMAL HIGH (ref 98–111)
Creatinine, Ser: 0.7 mg/dL (ref 0.44–1.00)
GFR calc non Af Amer: >60 mL/min (ref >60)
Glucose, Bld: 164 mg/dL — ABNORMAL HIGH (ref 70–99)
POTASSIUM: 4 mmol/L (ref 3.5–5.1)
Sodium: 145 mmol/L (ref 135–145)

## 2018-10-08 LAB — CBC
HCT: 31.6 % — ABNORMAL LOW (ref 36.0–46.0)
Hemoglobin: 9.1 g/dL — ABNORMAL LOW (ref 12.0–15.0)
MCH: 25 pg — ABNORMAL LOW (ref 26.0–34.0)
MCHC: 28.8 g/dL — ABNORMAL LOW (ref 30.0–36.0)
MCV: 86.8 fL (ref 80.0–100.0)
NRBC: 0 % (ref 0.0–0.2)
Platelets: 534 10*3/uL — ABNORMAL HIGH (ref 150–400)
RBC: 3.64 MIL/uL — ABNORMAL LOW (ref 3.87–5.11)
RDW: 17.5 % — ABNORMAL HIGH (ref 11.5–15.5)
WBC: 13.1 10*3/uL — ABNORMAL HIGH (ref 4.0–10.5)

## 2018-10-08 LAB — GLUCOSE, CAPILLARY
GLUCOSE-CAPILLARY: 130 mg/dL — AB (ref 70–99)
GLUCOSE-CAPILLARY: 106 mg/dL — AB (ref 70–99)
Glucose-Capillary: 121 mg/dL — ABNORMAL HIGH (ref 70–99)
Glucose-Capillary: 123 mg/dL — ABNORMAL HIGH (ref 70–99)
Glucose-Capillary: 138 mg/dL — ABNORMAL HIGH (ref 70–99)
Glucose-Capillary: 144 mg/dL — ABNORMAL HIGH (ref 70–99)
Glucose-Capillary: 97 mg/dL (ref 70–99)

## 2018-10-08 MED ORDER — FREE WATER
150.0000 mL | Freq: Four times a day (QID) | Status: DC
  Administered 2018-10-08 – 2018-10-10 (×10): 150 mL

## 2018-10-08 NOTE — Plan of Care (Signed)

## 2018-10-08 NOTE — Progress Notes (Signed)
STROKE TEAM PROGRESS NOTE   SUBJECTIVE (INTERVAL HISTORY) No family is at bedside.  Patient lying in bed comfortably.  Has speaking valve in place.  Discussed with RT, they feel pt secretion much less this week and cough getting stronger but still weak. CCM on board, not consider her as candidate for decannulation due to weak cough.   OBJECTIVE Vitals:   10/08/18 0615 10/08/18 0758 10/08/18 0827 10/08/18 0922  BP:  (!) 172/88 (!) 155/82 (!) 155/82  Pulse:  84 69 83  Resp:  10 15   Temp:  99.1 F (37.3 C)    TempSrc:  Oral    SpO2:  99% 100%   Weight: 115 kg     Height:        CBC:  Recent Labs  Lab 10/06/18 0534 10/08/18 0501  WBC 12.8* 13.1*  HGB 9.1* 9.1*  HCT 31.6* 31.6*  MCV 87.1 86.8  PLT 483* 534*    Basic Metabolic Panel:  Recent Labs  Lab 10/06/18 0534 10/08/18 0501  NA 143 145  K 4.3 4.0  CL 109 113*  CO2 26 25  GLUCOSE 170* 164*  BUN 27* 31*  CREATININE 0.79 0.70  CALCIUM 11.1* 11.1*    Lipid Panel:     Component Value Date/Time   CHOL 256 (H) 08/15/2018 0512   TRIG 248 (H) 08/15/2018 0512   HDL 35 (L) 08/15/2018 0512   CHOLHDL 7.3 08/15/2018 0512   VLDL 50 (H) 08/15/2018 0512   LDLCALC 171 (H) 08/15/2018 0512   HgbA1c:  Lab Results  Component Value Date   HGBA1C 11.4 (H) 08/15/2018   Urine Drug Screen:     Component Value Date/Time   LABOPIA NONE DETECTED 09/03/2018 1836   COCAINSCRNUR NONE DETECTED 08/12/2018 1836   LABBENZ NONE DETECTED 08/23/2018 1836   AMPHETMU NONE DETECTED 08/12/2018 1836   THCU NONE DETECTED 08/29/2018 1836   LABBARB NONE DETECTED 08/19/2018 1836    Alcohol Level No results found for: ETH  IMAGING  Ct Angio Head W Or Wo Contrast Ct Angio Neck W Or Wo Contrast 08/15/2018 IMPRESSION:  1. Interval basilar to left proximal PCA stenting. The density of the stent walls precludes detection of in stent stenosis; there is a degree of wasting at the mid basilar segment. There is flow in the left PCA beyond the  stent such that there is presumed stent patency.  2. Known lower pontine infarct. There are new small cerebellar infarcts since brain MRI yesterday.  3. Severe left P2 segment stenosis, also seen previously.  4. Moderate atheromatous narrowing in the proximal left MCA.    Ct Angio Head W Or Wo Contrast Ct Angio Neck W Or Wo Contrast 09/03/2018 IMPRESSION:  1. 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 No definite acute thrombus identified.  2. Severe stenosis left posterior cerebral artery and moderate stenosis right posterior cerebral artery.  3. Mild stenosis left MCA and moderate stenosis left MCA bifurcation.  4. No significant carotid or vertebral artery stenosis in the neck.    Ct Head Wo Contrast 08/19/2018 IMPRESSION:  No acute intracranial abnormalities. Mild white matter changes likely due to small vessel ischemia.    Mr Brain Wo Contrast 08/26/2018 IMPRESSION:  1. Acute nonhemorrhagic infarct involving the left paramedian brainstem. The infarct crosses midline.  2. Other periventricular and subcortical white matter disease is moderately advanced for age. This likely reflects the sequela of chronic microvascular ischemia.  3. Tapering  of the dens with prominent soft tissue pannus. 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    Ct Head Code Stroke Wo Contrast 08/29/2018  IMPRESSION:  1. No acute abnormality and no change from earlier today  2. ASPECTS is 10 3.    Cerebral Angiogram 08/24/2018 S/P 4 vessel cerebral arteriogram RT CFA approach. 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 S/P stent assisted angioplasty of distal basilar artery with patency of 80 to 90 %. 2.Approx 70 % stenosis of LT ICA supraclinoid seg   MRI 08/15/18 1. Progressive acute pontine infarct. New patchy  bilateral cerebellar infarction. New tiny left thalamic infarcts. 2. Preserved flow void in the stented basilar. 3. left facial/submandibular edema, please correlate with neck exam. Edited result: IMPRESSION: 1. Progressive acute pontine infarct. New patchy bilateral cerebellar infarction. New tiny left thalamic infarcts. 2. Preserved flow void in the stented basilar.  TTE - Left ventricle: The cavity size was normal. There was severe   concentric hypertrophy. Systolic function was normal. The   estimated ejection fraction was in the range of 60% to 65%. Wall   motion was normal; there were no regional wall motion   abnormalities. Doppler parameters are consistent with abnormal   left ventricular relaxation (grade 1 diastolic dysfunction). The   E/e&' ratio is <8, suggesting normal LV filling pressure. - Aortic valve: Trileaflet; mildly calcified leaflets.   Transvalvular velocity was minimally increased. There was no   regurgitation. Mean gradient (S): 12 mm Hg. - Mitral valve: Mildly thickened leaflets . There was trivial   regurgitation. - Left atrium: The atrium was normal in size. - Inferior vena cava: The vessel was dilated. The respirophasic   diameter changes were blunted (< 50%), consistent with elevated   central venous pressure. Impressions: - LVEF 60-65%, severe LVH, normal wall motion, grade 1 DD, normal   LV filling pressure, minimally increased aortic velocity without   signficant stenosis, trivial MR, normal LA size, dilated IVC.  CT abdomen and pelvis 09/03/18 Mild bilateral posterior basilar subsegmental atelectasis. Interval placement of new gastrostomy tube with tip and balloon within gastric lumen. Surgical drain is seen in the soft tissues in previous gastrostomy site. Small amount of gas and stranding is seen in the peritoneal fat anterior and inferior to the stomach which most likely is related to gastrostomy placement. Probable surgical wound seen in  right upper quadrant of the anterior abdominal wall. 8.2 x 2.3 cm gas and fluid collection is seen in the anterior abdominal wall which is unchanged compared to prior exam. Possible abscess cannot be excluded. Enlarged fibroid uterus.  PHYSICAL EXAM  Temp:  [97.5 F (36.4 C)-99.1 F (37.3 C)] 99.1 F (37.3 C) (12/04 0758) Pulse Rate:  [66-87] 83 (12/04 0922) Resp:  [0-22] 15 (12/04 0827) BP: (127-172)/(70-88) 155/82 (12/04 0922) SpO2:  [98 %-100 %] 100 % (12/04 0827) FiO2 (%):  [21 %] 21 % (12/04 0827) Weight:  [812 kg] 115 kg (12/04 0615)  General -  Obese middle-aged African-American lady, on trach collar, not in distress. She has abdominal surgical wound and drainage from the abdominal wall, dressing clean  Ophthalmologic - fundi not visualized due to noncooperation.  Cardiovascular - regular rate and rhythm  Neuro - on trach collar, eyes open, awake, alert. No ptosis or EOMI, denies diplopia.  PERRL. Bilateral facial weakness, tongue protrusion weak. Able to wiggle left hand fingers and left foot  toes. 2-/5 LLE and 2/5 LUE. Follows commands and good eye contact.  Speech is moderate dysarthric with speaking valve.   Dense right hemiplegia persists.  Neuro exam not changed from yesterday  ASSESSMENT/PLAN Ms. AXEL MEAS is a 57 y.o. female with history of difficult to control hypertension, insulin-dependent diabetes, obesity, hypothyroidism status post ablation presenting with chest discomfort, right-sided weakness, slurred speech and right facial droop. She did not receive IV t-PA due to late presentation. S/P stent assisted angioplasty of distal basilar artery.  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 Worsening symptoms with extension of pontine/medullary infarcts with new small bilateral cerebellar infarcts without evidence of stent re-stenosis or occlusion  Resultant quadriplegia, on trach and peg  CT head - No acute  intracranial abnormalities.  CTA H&N 08/08/2018 - Extensive atherosclerotic disease in the basilar with focal critical stenosis in the mid to distal basilar. No definite acute thrombus identified. Severe stenosis left posterior cerebral artery  MRI head - Acute nonhemorrhagic infarct involving the left paramedian brainstem.   CTA H&N 08/15/2018 - stent too dense to see BA lumen, but distal flow preserved. New small cerebellar infarcts since brain MRI yesterday.  Severe left P2 segment stenosis, also seen previously.   Repeat MRI - extension of b/l pontine and upper medullary infarcts with new b/l cerebellar infarcts.  2D Echo - EF 60-65%  LDL - 174  HgbA1c - 11.1  VTE prophylaxis - heparin subq  aspirin 81 mg daily prior to admission, now resumed on Brilinta 90 mg bid and ASA 81 mg daily.   Patient counseled to be compliant with her antithrombotic medications  Ongoing aggressive stroke risk factor management  Therapy recommendations:  SNF  Disposition:  palliative care service has discussed with pt using speaking valve and clearly pt would like full aggressive care. SW still working on the placement, no offer yet.    Abnormal uterine bleeding, improved  Has been following with GYN  Was on megace in the past  CT abd/pelvis 09/03/18 - enlarged fibroid uterus  continue megace 80mg  bid  Continue ASA and brilinta for now  OBGYN recs appreciated  Received prmarin 25mg  IV once - vaginal bleeding much improved  Close CBC monitoring  Anemia, improving  Hb 6.8->PRBC->7.1->9.0-7.7->7.0->PRBC->8.6->8.5-> 9.1->9.1  Likely due to iron deficiency, heavy periods and acute uterine bleeding and abdominal wall surgeries  Iron panel showed iron deficiency  On iron solutions  PRBC transfusion 1 U 09/13/2018 and 2U 09/07/18 and 2U 09/21/18  CBC monitoring  Intracranial stenosis  BA mid to distal severe stenosis s/p BA stenting  Left PCA severe stenosis, R PCA moderate  stenosis  Left MCA moderate stenosis  Uncontrolled stroke risk factors with HLD, HTN, DM  Non compliance with meds at home  Respiratory failure  S/p trach 09/04/2018  On trach collar now  Able to speak with speaking valve although dysarthric  Trach secretion improving   Not a candidate for decannulation at this time due to weak cough  Will continue to follow up with CCM and RT   Hypertension  Stable so far  BP goal 130-150   On Coreg 25 bid, amlodipine and hydralazine  Close monitoring  Hyperlipidemia  Lipid lowering medication PTA:  Lipitor 20 mg daily  LDL 174, goal < 70  Now on Lipitor 80 mg daily  Continue statin at discharge  Diabetes  HgbA1c 11.4, goal < 7.0  Uncontrolled at home  Glucose stable after insulin adjustment  Decrease Lantus again today to 15U bid   Decrease basal NovoLog again today to 3U Q4h  SSI  CBG monitoring  Low grade fever with leukocytosis, resolved  Tmax 101.6 -> afebrile  Leukocytosis - 9.1->8.5->11.9->14.0->12.8->13.1  UA WBC 21-50  CXR unremarkable, improving from prior  Blood cultures negative 10/26   Respiratory cultures - mixed flora   Abdominal abscess culture showed proteus mirabilis  Off antibiotics - completed course  Hypernatremia -> hyponatremia-> resolved  Na 143->145  On TF @ 65  Change NS @ 20 to free water 150cc Q6h  Continue BMP monitoring  Dysphagia s/p PEG complicated by abdominal wall abscess  Continue tube feeding @ 45 cc/h  PEG done 08/14/2018  Removed and replaced dislodged PEG 09/04/2018  CT showed Abdominal wall abscess - s/p open surgical drainage 08/20/2018 persistent foul odor discharge. Reexploration 09/07/2018  Culture showed peroteus mirabilis  Off meropenum  Wound debrided appropriately and sutures removed.  No evisceration  Trauma surgery now sign off  Still has weak cough on speech eval 09/29/2018  Follow with speech evaluation for candidacy of decannulation of  trach  Diarrhea, resolved  Likely related to TF  Dietitian has made formula change  Laxatives discontinued  Imodium as needed  Other Stroke Risk Factors  Obesity, Body mass index is 43.52 kg/m., recommend weight loss, diet and exercise as appropriate   Other Active Problems  Hypokalemia - resolved  Thrombocytosis - likely due to anemia, 483->534  Hospital day # 78  Rosalin Hawking, MD PhD Stroke Neurology 10/08/2018 11:04 AM   To contact Stroke Continuity provider, please refer to [email or web address] After hours, contact General Neurology

## 2018-10-08 NOTE — Social Work (Addendum)
11:55am- CSW left HIPPA compliant voicemail message for pt sister [name] as Andrea [name] at [facility] would like to reach out to her regarding pt and placement possibility.   [place] reviewing pt clinicals.   Westley Hummer, MSW, [place] Work [phone]

## 2018-10-09 LAB — GLUCOSE, CAPILLARY
Glucose-Capillary: 134 mg/dL — ABNORMAL HIGH (ref 70–99)
Glucose-Capillary: 130 mg/dL — ABNORMAL HIGH (ref 70–99)
Glucose-Capillary: 146 mg/dL — ABNORMAL HIGH (ref 70–99)
Glucose-Capillary: 103 mg/dL — ABNORMAL HIGH (ref 70–99)
Glucose-Capillary: 128 mg/dL — ABNORMAL HIGH (ref 70–99)
Glucose-Capillary: 123 mg/dL — ABNORMAL HIGH (ref 70–99)

## 2018-10-09 NOTE — Social Work (Addendum)
CSW spoke with Andrea [name] at [name] and in order to offer placement she would need to speak with [name] or family member to discuss details of placement.   CSW, joined by Andrea [name], called pt sister Andrea [name] whom Andrea [name] has requested to be involved in health care decisions to let her know of this development. We discussed that Andrea [name] has not had any offers still from surrounding facilities. We let her know that [name] has not been offered a bed at [name] any longer and that we had to extend our search further. I let Andrea [name] know that the attending MD has medically cleared [name] for discharge and that we are just waiting on placement offers. Andrea [name] expressed displeasure at distance of facility, stating that it would be difficult for family to travel there and that she was confident her siblings would not accept placement that far.  CSW went on to review the barriers for placement locally and why the distance had come into play, these include: pt care needs including trach, PEG, and ADL/IADL care; that Andrea [name] currently does not have any insurance; and given that she is medically cleared we cannot keep her in the hospital setting. Andrea [name] expressed understanding but frustration that nobody had called her to tell her that [name] has been medically cleared by surgery and neurology. Requested MD to call her and let her know this.  We let Andrea [name] know that we would be willing to sit down and meet with her siblings and extended family to talk about [name] medical status, placement, and answer additional questions that they may have about distance and placement process. Andrea [name] amenable, she will talk to pt family members and find a time to meet and discuss pt with pt and care team.   CSW let RN Case Manager [name] know about conversation and will page MD [name] and surgical to see if they would be willing to attend meeting or call pt family to discuss current status.   2:03- CSW received call  from pt sister Andrea [name], they are inquiring about a family meeting tomorrow at [place] confirmed that MD, RN CM, Elsa Surveyor, quantity, and RN leadership can meet with pt family at that time. Confirmed with Andrea [name] that she and siblings would be present at that time.   Andrea [name], MSW, [place] Work [phone]

## 2018-10-09 NOTE — Progress Notes (Signed)
Nutrition Follow-up  DOCUMENTATION CODES:   Morbid obesity  INTERVENTION:  Continue Vital AF 1.2 formula at goal rate of 65 ml/hr via PEG.  Continue free water flushes of 150 ml q 6 hours per tube.  Tube feeding to provide 1872 kcal, 117 grams of protein, and 1864 ml water.   NUTRITION DIAGNOSIS:   Inadequate oral intake related to inability to eat as evidenced by NPO status; ongoing  GOAL:   Patient will meet greater than or equal to 90% of their needs; met with TF  MONITOR:   TF tolerance, Labs, Skin, Weight trends, I & O's  REASON FOR ASSESSMENT:   Consult Enteral/tube feeding initiation and management  ASSESSMENT:   Andrea [name] is a 57 yo female with PMH of type 2 diabetes, HTN, CKD III, obesity, and anemia admitted for chest pain. Code Stroke called 10/10 in AM. 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 Intubated 10/10 in ICU.  10/21 trach and PEG.11/1 ex lap, I&D.  Per MD, no plans for decannulation as pt with weak cough. Pt has been tolerating her tube feedings via PEG. RD to continue with current orders. SNF placement ongoing. RD to continue to monitor. Labs and medications reviewed.   Diet Order:   Diet Order            Diet NPO time specified  Diet effective ____              EDUCATION NEEDS:   Not appropriate for education at this time  Skin:  Skin Assessment: Skin Integrity Issues: Skin Integrity Issues:: Incisions Stage II: healed Incisions: abdomen  Last BM:  12/4  Height:   Ht Readings from Last 1 Encounters:  09/03/18 _0  (1.626 m)    Weight:   Wt Readings from Last 1 Encounters:  10/08/18 115 kg    Ideal Body Weight:  54.5 kg  BMI:  Body mass index is 43.52 kg/m.  Estimated Nutritional Needs:   Kcal:  1600-1800  Protein:  120-135 grams  Fluid:  > 1.6 L    Andrea Parker, MS, RD, LDN Pager # [phone] After hours/ weekend pager # [phone]

## 2018-10-09 NOTE — Progress Notes (Signed)
STROKE TEAM PROGRESS NOTE   SUBJECTIVE (INTERVAL HISTORY) No family is at bedside.  Patient lying in bed comfortably. No acute event overnight. Secretion continue to be less. Discussed with CCM and they felt pt still has weak cough, not candidate for decanulation.  Had bed offer from SNF and will have family meeting in am.   OBJECTIVE Vitals:   10/09/18 1533 10/09/18 1600 10/09/18 2000 10/09/18 2048  BP: (!) 152/84 (!) 142/73  135/72  Pulse:  72  63  Resp:  16  (!) 23  Temp:   98.3 F (36.8 C)   TempSrc:   Oral   SpO2:  100%  100%  Weight:      Height:        CBC:  Recent Labs  Lab 10/06/18 0534 10/08/18 0501  WBC 12.8* 13.1*  HGB 9.1* 9.1*  HCT 31.6* 31.6*  MCV 87.1 86.8  PLT 483* 534*    Basic Metabolic Panel:  Recent Labs  Lab 10/06/18 0534 10/08/18 0501  NA 143 145  K 4.3 4.0  CL 109 113*  CO2 26 25  GLUCOSE 170* 164*  BUN 27* 31*  CREATININE 0.79 0.70  CALCIUM 11.1* 11.1*    Lipid Panel:     Component Value Date/Time   CHOL 256 (H) 08/15/2018 0512   TRIG 248 (H) 08/15/2018 0512   HDL 35 (L) 08/15/2018 0512   CHOLHDL 7.3 08/15/2018 0512   VLDL 50 (H) 08/15/2018 0512   LDLCALC 171 (H) 08/15/2018 0512   HgbA1c:  Lab Results  Component Value Date   HGBA1C 11.4 (H) 08/15/2018   Urine Drug Screen:     Component Value Date/Time   LABOPIA NONE DETECTED 09/01/2018 1836   COCAINSCRNUR NONE DETECTED 08/16/2018 1836   LABBENZ NONE DETECTED 08/09/2018 1836   AMPHETMU NONE DETECTED 08/28/2018 1836   THCU NONE DETECTED 08/10/2018 1836   LABBARB NONE DETECTED 08/09/2018 1836    Alcohol Level No results found for: ETH  IMAGING  Ct Angio Head W Or Wo Contrast Ct Angio Neck W Or Wo Contrast 08/15/2018 IMPRESSION:  1. Interval basilar to left proximal PCA stenting. The density of the stent walls precludes detection of in stent stenosis; there is a degree of wasting at the mid basilar segment. There is flow in the left PCA beyond the stent such that  there is presumed stent patency.  2. Known lower pontine infarct. There are new small cerebellar infarcts since brain MRI yesterday.  3. Severe left P2 segment stenosis, also seen previously.  4. Moderate atheromatous narrowing in the proximal left MCA.    Ct Angio Head W Or Wo Contrast Ct Angio Neck W Or Wo Contrast 08/12/2018 IMPRESSION:  1. 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 No definite acute thrombus identified.  2. Severe stenosis left posterior cerebral artery and moderate stenosis right posterior cerebral artery.  3. Mild stenosis left MCA and moderate stenosis left MCA bifurcation.  4. No significant carotid or vertebral artery stenosis in the neck.    Ct Head Wo Contrast 08/12/2018 IMPRESSION:  No acute intracranial abnormalities. Mild white matter changes likely due to small vessel ischemia.    Mr Brain Wo Contrast 08/20/2018 IMPRESSION:  1. Acute nonhemorrhagic infarct involving the left paramedian brainstem. The infarct crosses midline.  2. Other periventricular and subcortical white matter disease is moderately advanced for age. This likely reflects the sequela of chronic microvascular ischemia.  3. Tapering of the dens  with prominent soft tissue pannus. 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    Ct Head Code Stroke Wo Contrast 08/15/2018  IMPRESSION:  1. No acute abnormality and no change from earlier today  2. ASPECTS is 10 3.    Cerebral Angiogram 09/02/2018 S/P 4 vessel cerebral arteriogram RT CFA approach. 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 S/P stent assisted angioplasty of distal basilar artery with patency of 80 to 90 %. 2.Approx 70 % stenosis of LT ICA supraclinoid seg   MRI 08/15/18 1. Progressive acute pontine infarct. New patchy bilateral cerebellar  infarction. New tiny left thalamic infarcts. 2. Preserved flow void in the stented basilar. 3. left facial/submandibular edema, please correlate with neck exam. Edited result: IMPRESSION: 1. Progressive acute pontine infarct. New patchy bilateral cerebellar infarction. New tiny left thalamic infarcts. 2. Preserved flow void in the stented basilar.  TTE - Left ventricle: The cavity size was normal. There was severe   concentric hypertrophy. Systolic function was normal. The   estimated ejection fraction was in the range of 60% to 65%. Wall   motion was normal; there were no regional wall motion   abnormalities. Doppler parameters are consistent with abnormal   left ventricular relaxation (grade 1 diastolic dysfunction). The   E/e&' ratio is <8, suggesting normal LV filling pressure. - Aortic valve: Trileaflet; mildly calcified leaflets.   Transvalvular velocity was minimally increased. There was no   regurgitation. Mean gradient (S): 12 mm Hg. - Mitral valve: Mildly thickened leaflets . There was trivial   regurgitation. - Left atrium: The atrium was normal in size. - Inferior vena cava: The vessel was dilated. The respirophasic   diameter changes were blunted (< 50%), consistent with elevated   central venous pressure. Impressions: - LVEF 60-65%, severe LVH, normal wall motion, grade 1 DD, normal   LV filling pressure, minimally increased aortic velocity without   signficant stenosis, trivial MR, normal LA size, dilated IVC.  CT abdomen and pelvis 09/03/18 Mild bilateral posterior basilar subsegmental atelectasis. Interval placement of new gastrostomy tube with tip and balloon within gastric lumen. Surgical drain is seen in the soft tissues in previous gastrostomy site. Small amount of gas and stranding is seen in the peritoneal fat anterior and inferior to the stomach which most likely is related to gastrostomy placement. Probable surgical wound seen in right upper quadrant of  the anterior abdominal wall. 8.2 x 2.3 cm gas and fluid collection is seen in the anterior abdominal wall which is unchanged compared to prior exam. Possible abscess cannot be excluded. Enlarged fibroid uterus.  PHYSICAL EXAM  Temp:  [98.1 F (36.7 C)-98.6 F (37 C)] 98.3 F (36.8 C) (12/05 2000) Pulse Rate:  [61-84] 63 (12/05 2048) Resp:  [16-31] 23 (12/05 2048) BP: (135-164)/(63-89) 135/72 (12/05 2048) SpO2:  [99 %-100 %] 100 % (12/05 2048) FiO2 (%):  [21 %-28 %] 21 % (12/05 2048)  General -  Obese middle-aged African-American lady, on trach collar, not in distress. She has abdominal surgical wound and drainage from the abdominal wall, dressing clean  Ophthalmologic - fundi not visualized due to noncooperation.  Cardiovascular - regular rate and rhythm  Neuro - on trach collar, eyes open, awake, alert. No ptosis or EOMI, denies diplopia.  PERRL. Bilateral facial weakness, tongue protrusion weak. Able to wiggle left hand fingers and left foot toes. 2-/5 LLE and 2/5 LUE. Follows commands and good  eye contact.  Speech is moderate dysarthric with speaking valve.   Dense right hemiplegia persists.  Neuro exam not changed from yesterday  ASSESSMENT/PLAN Ms. PEARSON REASONS is a 57 y.o. female with history of difficult to control hypertension, insulin-dependent diabetes, obesity, hypothyroidism status post ablation presenting with chest discomfort, right-sided weakness, slurred speech and right facial droop. She did not receive IV t-PA due to late presentation. S/P stent assisted angioplasty of distal basilar artery.  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 Worsening symptoms with extension of pontine/medullary infarcts with new small bilateral cerebellar infarcts without evidence of stent re-stenosis or occlusion  Resultant quadriplegia, on trach and peg  CT head - No acute intracranial abnormalities.  CTA H&N 08/13/2018 - Extensive  atherosclerotic disease in the basilar with focal critical stenosis in the mid to distal basilar. No definite acute thrombus identified. Severe stenosis left posterior cerebral artery  MRI head - Acute nonhemorrhagic infarct involving the left paramedian brainstem.   CTA H&N 08/15/2018 - stent too dense to see BA lumen, but distal flow preserved. New small cerebellar infarcts since brain MRI yesterday.  Severe left P2 segment stenosis, also seen previously.   Repeat MRI - extension of b/l pontine and upper medullary infarcts with new b/l cerebellar infarcts.  2D Echo - EF 60-65%  LDL - 174  HgbA1c - 11.1  VTE prophylaxis - heparin subq  aspirin 81 mg daily prior to admission, now resumed on Brilinta 90 mg bid and ASA 81 mg daily.   Patient counseled to be compliant with her antithrombotic medications  Ongoing aggressive stroke risk factor management  Therapy recommendations:  SNF  Disposition:  palliative care service has discussed with pt using speaking valve and clearly pt would like full aggressive care. SW still working on the placement   Abnormal uterine bleeding, improved  Has been following with GYN  Was on megace in the past  CT abd/pelvis 09/03/18 - enlarged fibroid uterus  continue megace 80mg  bid  Continue ASA and brilinta for now  OBGYN recs appreciated  Received prmarin 25mg  IV once - vaginal bleeding much improved  Close CBC monitoring  Anemia, improving  Hb 6.8->PRBC->7.1->9.0-7.7->7.0->PRBC->8.6->8.5-> 9.1->9.1  Likely due to iron deficiency, heavy periods and acute uterine bleeding and abdominal wall surgeries  Iron panel showed iron deficiency  On iron solutions  PRBC transfusion 1 U 09/26/2018 and 2U 09/07/18 and 2U 09/21/18  CBC monitoring  Intracranial stenosis  BA mid to distal severe stenosis s/p BA stenting  Left PCA severe stenosis, R PCA moderate stenosis  Left MCA moderate stenosis  Uncontrolled stroke risk factors with HLD,  HTN, DM  Non compliance with meds at home  Respiratory failure  S/p trach 08/23/2018  On trach collar now  Able to speak with speaking valve although dysarthric  Trach secretion improving   Not a candidate for decannulation at this time due to weak cough  Will continue to follow up with CCM and RT   Hypertension  Stable so far  BP goal 130-150   On Coreg 25 bid, amlodipine and hydralazine  Close monitoring  Hyperlipidemia  Lipid lowering medication PTA:  Lipitor 20 mg daily  LDL 174, goal < 70  Now on Lipitor 80 mg daily  Continue statin at discharge  Diabetes  HgbA1c 11.4, goal < 7.0  Uncontrolled at home  Glucose stable after insulin adjustment  Decrease Lantus again today to 15U bid   Decrease basal NovoLog again today  to 3U Q4h  SSI  CBG monitoring  Low grade fever with leukocytosis, resolved  Tmax 101.6 -> afebrile  Leukocytosis - 9.1->8.5->11.9->14.0->12.8->13.1  UA WBC 21-50  CXR unremarkable, improving from prior  Blood cultures negative 10/26   Respiratory cultures - mixed flora   Abdominal abscess culture showed proteus mirabilis  Off antibiotics - completed course  Hypernatremia -> hyponatremia-> resolved  Na 143->145  On TF @ 65  Change NS @ 20 to free water 150cc Q6h  Continue BMP monitoring  Dysphagia s/p PEG complicated by abdominal wall abscess  Continue tube feeding @ 45 cc/h  PEG done 08/19/2018  Removed and replaced dislodged PEG 08/23/2018  CT showed Abdominal wall abscess - s/p open surgical drainage 09/02/2018 persistent foul odor discharge. Reexploration 10/03/2018  Culture showed peroteus mirabilis  Off meropenum  Wound debrided appropriately and sutures removed.  No evisceration  Trauma surgery now sign off  Still has weak cough on speech eval 09/29/2018  Follow with speech evaluation for candidacy of decannulation of trach  Diarrhea, resolved  Likely related to TF  Dietitian has made formula  change  Laxatives discontinued  Imodium as needed  Other Stroke Risk Factors  Obesity, Body mass index is 43.52 kg/m., recommend weight loss, diet and exercise as appropriate   Other Active Problems  Hypokalemia - resolved  Thrombocytosis - likely due to anemia, 483->534  Hospital day # 63  Rosalin Hawking, MD PhD Stroke Neurology 10/09/2018 9:08 PM   To contact Stroke Continuity provider, please refer to [email or web address] After hours, contact General Neurology

## 2018-10-10 LAB — BASIC METABOLIC PANEL
Anion gap: 11 (ref 5–15)
BUN: 33 mg/dL — ABNORMAL HIGH (ref 6–20)
CO2: 22 mmol/L (ref 22–32)
Calcium: 11.7 mg/dL — ABNORMAL HIGH (ref 8.9–10.3)
Chloride: 115 mmol/L — ABNORMAL HIGH (ref 98–111)
Creatinine, Ser: 0.74 mg/dL (ref 0.44–1.00)
GFR calc non Af Amer: >60 mL/min (ref >60)
Glucose, Bld: 128 mg/dL — ABNORMAL HIGH (ref 70–99)
Potassium: 4.2 mmol/L (ref 3.5–5.1)
Sodium: 148 mmol/L — ABNORMAL HIGH (ref 135–145)

## 2018-10-10 LAB — CBC
HCT: 33.5 % — ABNORMAL LOW (ref 36.0–46.0)
Hemoglobin: 9.8 g/dL — ABNORMAL LOW (ref 12.0–15.0)
MCH: 25.4 pg — ABNORMAL LOW (ref 26.0–34.0)
MCHC: 29.3 g/dL — ABNORMAL LOW (ref 30.0–36.0)
MCV: 86.8 fL (ref 80.0–100.0)
Platelets: 496 10*3/uL — ABNORMAL HIGH (ref 150–400)
RBC: 3.86 MIL/uL — ABNORMAL LOW (ref 3.87–5.11)
RDW: 17.5 % — ABNORMAL HIGH (ref 11.5–15.5)
WBC: 12.6 10*3/uL — ABNORMAL HIGH (ref 4.0–10.5)
nRBC: 0 % (ref 0.0–0.2)

## 2018-10-10 LAB — GLUCOSE, CAPILLARY
Glucose-Capillary: 127 mg/dL — ABNORMAL HIGH (ref 70–99)
Glucose-Capillary: 110 mg/dL — ABNORMAL HIGH (ref 70–99)
Glucose-Capillary: 112 mg/dL — ABNORMAL HIGH (ref 70–99)
Glucose-Capillary: 138 mg/dL — ABNORMAL HIGH (ref 70–99)
Glucose-Capillary: 131 mg/dL — ABNORMAL HIGH (ref 70–99)

## 2018-10-10 MED ORDER — FREE WATER
150.0000 mL | Status: DC
  Administered 2018-10-11 – 2018-10-22 (×69): 150 mL

## 2018-10-10 NOTE — Progress Notes (Signed)
STROKE TEAM PROGRESS NOTE   SUBJECTIVE (INTERVAL HISTORY) Sisters are at bedside.  Patient lying in bed comfortably. No acute event overnight. Secretion continue to be less. Had family meeting later in the conference room. Pt got only bed offer in [place] and plan for d/c on Monday.   OBJECTIVE Vitals:   10/10/18 1117 10/10/18 1143 10/10/18 1512 10/10/18 1600  BP: (!) 141/76 133/86 (!) 145/66   Pulse: 94 77 78   Resp: (!) 22 (!) 23 15   Temp:  98.3 F (36.8 C)  98.3 F (36.8 C)  TempSrc:  Oral  Oral  SpO2: 94% 98% 99%   Weight:      Height:        CBC:  Recent Labs  Lab 10/08/18 0501 10/10/18 1155  WBC 13.1* 12.6*  HGB 9.1* 9.8*  HCT 31.6* 33.5*  MCV 86.8 86.8  PLT 534* 496*    Basic Metabolic Panel:  Recent Labs  Lab 10/08/18 0501 10/10/18 1155  NA 145 148*  K 4.0 4.2  CL 113* 115*  CO2 25 22  GLUCOSE 164* 128*  BUN 31* 33*  CREATININE 0.70 0.74  CALCIUM 11.1* 11.7*    Lipid Panel:     Component Value Date/Time   CHOL 256 (H) 08/15/2018 0512   TRIG 248 (H) 08/15/2018 0512   HDL 35 (L) 08/15/2018 0512   CHOLHDL 7.3 08/15/2018 0512   VLDL 50 (H) 08/15/2018 0512   LDLCALC 171 (H) 08/15/2018 0512   HgbA1c:  Lab Results  Component Value Date   HGBA1C 11.4 (H) 08/15/2018   Urine Drug Screen:     Component Value Date/Time   LABOPIA NONE DETECTED 08/12/2018 1836   COCAINSCRNUR NONE DETECTED 08/26/2018 1836   LABBENZ NONE DETECTED 08/07/2018 1836   AMPHETMU NONE DETECTED 08/30/2018 1836   THCU NONE DETECTED 08/27/2018 1836   LABBARB NONE DETECTED 08/13/2018 1836    Alcohol Level No results found for: ETH  IMAGING  Ct Angio Head W Or Wo Contrast Ct Angio Neck W Or Wo Contrast 08/15/2018 IMPRESSION:  1. Interval basilar to left proximal PCA stenting. The density of the stent walls precludes detection of in stent stenosis; there is a degree of wasting at the mid basilar segment. There is flow in the left PCA beyond the stent such that there is  presumed stent patency.  2. Known lower pontine infarct. There are new small cerebellar infarcts since brain MRI yesterday.  3. Severe left P2 segment stenosis, also seen previously.  4. Moderate atheromatous narrowing in the proximal left MCA.    Ct Angio Head W Or Wo Contrast Ct Angio Neck W Or Wo Contrast 08/24/2018 IMPRESSION:  1. 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 No definite acute thrombus identified.  2. Severe stenosis left posterior cerebral artery and moderate stenosis right posterior cerebral artery.  3. Mild stenosis left MCA and moderate stenosis left MCA bifurcation.  4. No significant carotid or vertebral artery stenosis in the neck.    Ct Head Wo Contrast 09/02/2018 IMPRESSION:  No acute intracranial abnormalities. Mild white matter changes likely due to small vessel ischemia.    Mr Brain Wo Contrast 09/03/2018 IMPRESSION:  1. Acute nonhemorrhagic infarct involving the left paramedian brainstem. The infarct crosses midline.  2. Other periventricular and subcortical white matter disease is moderately advanced for age. This likely reflects the sequela of chronic microvascular ischemia.  3. Tapering of the dens with prominent soft tissue  pannus. 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    Ct Head Code Stroke Wo Contrast 08/28/2018  IMPRESSION:  1. No acute abnormality and no change from earlier today  2. ASPECTS is 10 3.    Cerebral Angiogram 08/13/2018 S/P 4 vessel cerebral arteriogram RT CFA approach. 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 S/P stent assisted angioplasty of distal basilar artery with patency of 80 to 90 %. 2.Approx 70 % stenosis of LT ICA supraclinoid seg   MRI 08/15/18 1. Progressive acute pontine infarct. New patchy bilateral cerebellar infarction. New  tiny left thalamic infarcts. 2. Preserved flow void in the stented basilar. 3. left facial/submandibular edema, please correlate with neck exam. Edited result: IMPRESSION: 1. Progressive acute pontine infarct. New patchy bilateral cerebellar infarction. New tiny left thalamic infarcts. 2. Preserved flow void in the stented basilar.  TTE - Left ventricle: The cavity size was normal. There was severe   concentric hypertrophy. Systolic function was normal. The   estimated ejection fraction was in the range of 60% to 65%. Wall   motion was normal; there were no regional wall motion   abnormalities. Doppler parameters are consistent with abnormal   left ventricular relaxation (grade 1 diastolic dysfunction). The   E/e&' ratio is <8, suggesting normal LV filling pressure. - Aortic valve: Trileaflet; mildly calcified leaflets.   Transvalvular velocity was minimally increased. There was no   regurgitation. Mean gradient (S): 12 mm Hg. - Mitral valve: Mildly thickened leaflets . There was trivial   regurgitation. - Left atrium: The atrium was normal in size. - Inferior vena cava: The vessel was dilated. The respirophasic   diameter changes were blunted (< 50%), consistent with elevated   central venous pressure. Impressions: - LVEF 60-65%, severe LVH, normal wall motion, grade 1 DD, normal   LV filling pressure, minimally increased aortic velocity without   signficant stenosis, trivial MR, normal LA size, dilated IVC.  CT abdomen and pelvis 09/03/18 Mild bilateral posterior basilar subsegmental atelectasis. Interval placement of new gastrostomy tube with tip and balloon within gastric lumen. Surgical drain is seen in the soft tissues in previous gastrostomy site. Small amount of gas and stranding is seen in the peritoneal fat anterior and inferior to the stomach which most likely is related to gastrostomy placement. Probable surgical wound seen in right upper quadrant of the anterior  abdominal wall. 8.2 x 2.3 cm gas and fluid collection is seen in the anterior abdominal wall which is unchanged compared to prior exam. Possible abscess cannot be excluded. Enlarged fibroid uterus.  PHYSICAL EXAM  Temp:  [98.1 F (36.7 C)-98.3 F (36.8 C)] 98.3 F (36.8 C) (12/06 1600) Pulse Rate:  [63-94] 78 (12/06 1512) Resp:  [15-23] 15 (12/06 1512) BP: (133-157)/(66-88) 145/66 (12/06 1512) SpO2:  [94 %-100 %] 99 % (12/06 1512) FiO2 (%):  [21 %] 21 % (12/06 1512)  General -  Obese middle-aged African-American lady, on trach collar, not in distress. She has abdominal surgical wound and drainage from the abdominal wall, dressing clean  Ophthalmologic - fundi not visualized due to noncooperation.  Cardiovascular - regular rate and rhythm  Neuro - on trach collar, eyes open, awake, alert. No ptosis or EOMI, denies diplopia.  PERRL. Bilateral facial weakness, tongue protrusion weak. Able to wiggle left hand fingers and left foot toes. 2-/5 LLE and 2/5 LUE. Follows commands and good eye contact.  Speech is  moderate dysarthric with speaking valve.   Dense right hemiplegia persists.  Neuro exam not changed from yesterday  ASSESSMENT/PLAN [name] is a 57 y.o. female with history of difficult to control hypertension, insulin-dependent diabetes, obesity, hypothyroidism status post ablation presenting with chest discomfort, right-sided weakness, slurred speech and right facial droop. She did not receive IV t-PA due to late presentation. S/P stent assisted angioplasty of distal basilar artery.  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 Worsening symptoms with extension of pontine/medullary infarcts with new small bilateral cerebellar infarcts without evidence of stent re-stenosis or occlusion  Resultant quadriplegia, on trach and peg  CT head - No acute intracranial abnormalities.  CTA H&N 09/04/2018 - Extensive atherosclerotic  disease in the basilar with focal critical stenosis in the mid to distal basilar. No definite acute thrombus identified. Severe stenosis left posterior cerebral artery  MRI head - Acute nonhemorrhagic infarct involving the left paramedian brainstem.   CTA H&N 08/15/2018 - stent too dense to see BA lumen, but distal flow preserved. New small cerebellar infarcts since brain MRI yesterday.  Severe left P2 segment stenosis, also seen previously.   Repeat MRI - extension of b/l pontine and upper medullary infarcts with new b/l cerebellar infarcts.  2D Echo - EF 60-65%  LDL - 174  HgbA1c - 11.1  VTE prophylaxis - heparin subq  aspirin 81 mg daily prior to admission, now resumed on Brilinta 90 mg bid and ASA 81 mg daily.   Patient counseled to be compliant with her antithrombotic medications  Ongoing aggressive stroke risk factor management  Therapy recommendations:  SNF  Disposition:  palliative care service has discussed with pt using speaking valve and clearly pt would like full aggressive care. SW still working on the placement, pt received only bed offer in [place] and will plan of d/c on Monday  Abnormal uterine bleeding, improved  Has been following with GYN  Was on megace in the past  CT abd/pelvis 09/03/18 - enlarged fibroid uterus  continue megace 80mg  bid  Continue ASA and brilinta for now  OBGYN recs appreciated  Received prmarin 25mg  IV once - vaginal bleeding much improved  Close CBC monitoring  Anemia, improving  Hb 6.8->PRBC->7.1->9.0-7.7->7.0->PRBC->8.6->8.5-> 9.1->9.1->9.8  Likely due to iron deficiency, heavy periods and acute uterine bleeding and abdominal wall surgeries  Iron panel showed iron deficiency  On iron solutions  PRBC transfusion 1 U 10/01/2018 and 2U 09/07/18 and 2U 09/21/18  CBC monitoring  Intracranial stenosis  BA mid to distal severe stenosis s/p BA stenting  Left PCA severe stenosis, R PCA moderate stenosis  Left MCA  moderate stenosis  Uncontrolled stroke risk factors with HLD, HTN, DM  Non compliance with meds at home  Respiratory failure  S/p trach 09/03/2018  On trach collar now  Able to speak with speaking valve although dysarthric  Trach secretion improving   Not a candidate for decannulation at this time due to weak cough  Will continue to follow up with CCM and RT   Hypertension  Stable so far  BP goal 130-150   On Coreg 25 bid, amlodipine and hydralazine  Close monitoring  Hyperlipidemia  Lipid lowering medication PTA:  Lipitor 20 mg daily  LDL 174, goal < 70  Now on Lipitor 80 mg daily  Continue statin at discharge  Diabetes  HgbA1c 11.4, goal < 7.0  Uncontrolled at home  Glucose stable after insulin adjustment  Decrease Lantus again today to 15U  bid   Decrease basal NovoLog again today to 3U Q4h  SSI  CBG monitoring  Low grade fever with leukocytosis, resolved  Tmax 101.6 -> afebrile  Leukocytosis - 9.1->8.5->11.9->14.0->12.8->13.1->12.6  UA WBC 21-50  CXR unremarkable, improving from prior  Blood cultures negative 10/26   Respiratory cultures - mixed flora   Abdominal abscess culture showed proteus mirabilis  Off antibiotics - completed course  Hypernatremia -> hyponatremia-> hypernatremia  Na 143->145->148  On TF @ 65  Increase free water to 150cc Q4h  Continue BMP monitoring  Dysphagia s/p PEG complicated by abdominal wall abscess  Continue tube feeding @ 45 cc/h  PEG done 09/02/2018  Removed and replaced dislodged PEG 08/31/2018  CT showed Abdominal wall abscess - s/p open surgical drainage 08/25/2018 persistent foul odor discharge. Reexploration 09/23/2018  Culture showed peroteus mirabilis  Off meropenum  Wound debrided appropriately and sutures removed.  No evisceration  Trauma surgery now sign off  Still has weak cough on speech eval  Not candidate for decanulation yet  Diarrhea, resolved  Likely related to  TF  Dietitian has made formula change  Laxatives discontinued  Imodium as needed  Other Stroke Risk Factors  Obesity, Body mass index is 43.52 kg/m., recommend weight loss, diet and exercise as appropriate   Other Active Problems  Hypokalemia - resolved  Thrombocytosis - likely due to anemia, 483->534->496  Hospital day # 57  Rosalin Hawking, MD PhD Stroke Neurology 10/10/2018 8:26 PM   To contact Stroke Continuity provider, please refer to [email or web address] After hours, contact General Neurology

## 2018-10-10 NOTE — Progress Notes (Signed)
  Speech Language Pathology Treatment: Dysphagia;Passy Muir Speaking valve  Patient Details Name: Andrea [name] MRN: [id number] DOB: 1961-10-19 Today's Date: 10/10/2018 Time: 1201-1224 SLP Time Calculation (min) (ACUTE ONLY): 23 min  Assessment / Plan / Recommendation Clinical Impression  Pt with brighter affect, smiling and talking with her sisters with PMV in place.  Positioned for optimal safety with PO trials: provided with tspns of puree/honey-thick liquids, requiring verbal cues with each bolus to push material into throat and swallow with extra effort. No overt difficulty, however pt with hx of sensory deficits creating opportunity for silent aspiration.  Pt with clear phonation throughout - mod verbal cues for respiratory support to increase length of utterance and volume.   Pt for D/C to SNF in [place] on Monday.  Recommend repeating FEES Monday am before her departure - last study was 11/20.  Pt may have improved ability to consume more liberal POs.      HPI HPI: 57 yo presented to ED with chest pain noted Rt hemiparesis in ED with acute Left paramedian brainstem infarct. Intubated 10/10 for angioplasty, extubated post procedure and reintubated. Repeat MRI with brainstem infarct extension and bil cerebellar infarcts. Peg/trach 08/30/2018. FEES 09/15/18, 09/24/18. PMHx: HTN, DM, CKD      SLP Plan  Continue with current plan of care       Recommendations         Patient may use Passy-Muir Speech Valve: During all therapies with supervision;Intermittently with supervision PMSV Supervision: Full         Oral Care Recommendations: Oral care QID Follow up Recommendations: Skilled Nursing facility SLP Visit Diagnosis: Dysphagia, oropharyngeal phase (R13.12);Aphonia (R49.1) Plan: Continue with current plan of care       GO                Assunta Curtis 10/10/2018, 12:54 PM  Aryav Wimberly L. Tivis Ringer, [place] Office number  [phone] Pager [phone]

## 2018-10-11 LAB — GLUCOSE, CAPILLARY
Glucose-Capillary: 106 mg/dL — ABNORMAL HIGH (ref 70–99)
Glucose-Capillary: 140 mg/dL — ABNORMAL HIGH (ref 70–99)
Glucose-Capillary: 143 mg/dL — ABNORMAL HIGH (ref 70–99)
Glucose-Capillary: 152 mg/dL — ABNORMAL HIGH (ref 70–99)
Glucose-Capillary: 161 mg/dL — ABNORMAL HIGH (ref 70–99)

## 2018-10-11 NOTE — Progress Notes (Signed)
STROKE TEAM PROGRESS NOTE   SUBJECTIVE (INTERVAL HISTORY) Andrea [name] daughter is at bedside.  Patient lying in bed comfortably. No acute event overnight. Secretion continue to be less.   Pt got only bed offer in [place] and plan for d/c on Monday.   OBJECTIVE Vitals:   10/11/18 0500 10/11/18 0753 10/11/18 0837 10/11/18 1148  BP: (!) 156/81 (!) 143/78 (!) 149/73 138/79  Pulse: 82 84 80 74  Resp: (!) 31 17 (!) 21 13  Temp: 98.7 F (37.1 C) 98.7 F (37.1 C)  98.8 F (37.1 C)  TempSrc: Oral Oral  Oral  SpO2: 93% 94% 97% 92%  Weight:      Height:        CBC:  Recent Labs  Lab 10/08/18 0501 10/10/18 1155  WBC 13.1* 12.6*  HGB 9.1* 9.8*  HCT 31.6* 33.5*  MCV 86.8 86.8  PLT 534* 496*    Basic Metabolic Panel:  Recent Labs  Lab 10/08/18 0501 10/10/18 1155  NA 145 148*  K 4.0 4.2  CL 113* 115*  CO2 25 22  GLUCOSE 164* 128*  BUN 31* 33*  CREATININE 0.70 0.74  CALCIUM 11.1* 11.7*    Lipid Panel:     Component Value Date/Time   CHOL 256 (H) 08/15/2018 0512   TRIG 248 (H) 08/15/2018 0512   HDL 35 (L) 08/15/2018 0512   CHOLHDL 7.3 08/15/2018 0512   VLDL 50 (H) 08/15/2018 0512   LDLCALC 171 (H) 08/15/2018 0512   HgbA1c:  Lab Results  Component Value Date   HGBA1C 11.4 (H) 08/15/2018   Urine Drug Screen:     Component Value Date/Time   LABOPIA NONE DETECTED 08/07/2018 1836   COCAINSCRNUR NONE DETECTED 08/05/2018 1836   LABBENZ NONE DETECTED 08/28/2018 1836   AMPHETMU NONE DETECTED 08/15/2018 1836   THCU NONE DETECTED 09/02/2018 1836   LABBARB NONE DETECTED 08/09/2018 1836    Alcohol Level No results found for: ETH  IMAGING  Ct Angio Head W Or Wo Contrast Ct Angio Neck W Or Wo Contrast 08/15/2018 IMPRESSION:  1. Interval basilar to left proximal PCA stenting. The density of the stent walls precludes detection of in stent stenosis; there is a degree of wasting at the mid basilar segment. There is flow in the left PCA beyond the stent such that there is  presumed stent patency.  2. Known lower pontine infarct. There are new small cerebellar infarcts since brain MRI yesterday.  3. Severe left P2 segment stenosis, also seen previously.  4. Moderate atheromatous narrowing in the proximal left MCA.    Ct Angio Head W Or Wo Contrast Ct Angio Neck W Or Wo Contrast 08/18/2018 IMPRESSION:  1. 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 No definite acute thrombus identified.  2. Severe stenosis left posterior cerebral artery and moderate stenosis right posterior cerebral artery.  3. Mild stenosis left MCA and moderate stenosis left MCA bifurcation.  4. No significant carotid or vertebral artery stenosis in the neck.    Ct Head Wo Contrast 08/27/2018 IMPRESSION:  No acute intracranial abnormalities. Mild white matter changes likely due to small vessel ischemia.    Mr Brain Wo Contrast 08/17/2018 IMPRESSION:  1. Acute nonhemorrhagic infarct involving the left paramedian brainstem. The infarct crosses midline.  2. Other periventricular and subcortical white matter disease is moderately advanced for age. This likely reflects the sequela of chronic microvascular ischemia.  3. Tapering of the dens with prominent soft tissue pannus.  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    Ct Head Code Stroke Wo Contrast 08/23/2018  IMPRESSION:  1. No acute abnormality and no change from earlier today  2. ASPECTS is 10 3.    Cerebral Angiogram 08/21/2018 S/P 4 vessel cerebral arteriogram RT CFA approach. 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 S/P stent assisted angioplasty of distal basilar artery with patency of 80 to 90 %. 2.Approx 70 % stenosis of LT ICA supraclinoid seg   MRI 08/15/18 1. Progressive acute pontine infarct. New patchy bilateral cerebellar infarction. New  tiny left thalamic infarcts. 2. Preserved flow void in the stented basilar. 3. left facial/submandibular edema, please correlate with neck exam. Edited result: IMPRESSION: 1. Progressive acute pontine infarct. New patchy bilateral cerebellar infarction. New tiny left thalamic infarcts. 2. Preserved flow void in the stented basilar.  TTE - Left ventricle: The cavity size was normal. There was severe   concentric hypertrophy. Systolic function was normal. The   estimated ejection fraction was in the range of 60% to 65%. Wall   motion was normal; there were no regional wall motion   abnormalities. Doppler parameters are consistent with abnormal   left ventricular relaxation (grade 1 diastolic dysfunction). The   E/e&' ratio is <8, suggesting normal LV filling pressure. - Aortic valve: Trileaflet; mildly calcified leaflets.   Transvalvular velocity was minimally increased. There was no   regurgitation. Mean gradient (S): 12 mm Hg. - Mitral valve: Mildly thickened leaflets . There was trivial   regurgitation. - Left atrium: The atrium was normal in size. - Inferior vena cava: The vessel was dilated. The respirophasic   diameter changes were blunted (< 50%), consistent with elevated   central venous pressure. Impressions: - LVEF 60-65%, severe LVH, normal wall motion, grade 1 DD, normal   LV filling pressure, minimally increased aortic velocity without   signficant stenosis, trivial MR, normal LA size, dilated IVC.  CT abdomen and pelvis 09/03/18 Mild bilateral posterior basilar subsegmental atelectasis. Interval placement of new gastrostomy tube with tip and balloon within gastric lumen. Surgical drain is seen in the soft tissues in previous gastrostomy site. Small amount of gas and stranding is seen in the peritoneal fat anterior and inferior to the stomach which most likely is related to gastrostomy placement. Probable surgical wound seen in right upper quadrant of the anterior  abdominal wall. 8.2 x 2.3 cm gas and fluid collection is seen in the anterior abdominal wall which is unchanged compared to prior exam. Possible abscess cannot be excluded. Enlarged fibroid uterus.  PHYSICAL EXAM  Temp:  [98.3 F (36.8 C)-99.2 F (37.3 C)] 98.8 F (37.1 C) (12/07 1148) Pulse Rate:  [65-84] 74 (12/07 1148) Resp:  [13-31] 13 (12/07 1148) BP: (138-156)/(66-81) 138/79 (12/07 1148) SpO2:  [92 %-99 %] 92 % (12/07 1148) FiO2 (%):  [21 %] 21 % (12/07 0837)  General -  Obese middle-aged African-American lady, on trach collar, not in distress. She has abdominal surgical wound and drainage from the abdominal wall, dressing clean  Ophthalmologic - fundi not visualized due to noncooperation.  Cardiovascular - regular rate and rhythm  Neuro - on trach collar, eyes open, awake, alert. No ptosis or EOMI, denies diplopia.  PERRL. Bilateral facial weakness, tongue protrusion weak. Able to wiggle left hand fingers and left foot toes. 2-/5 LLE and 2/5 LUE. Follows commands and good eye contact.  Speech is moderate  dysarthric with speaking valve.   Dense right hemiplegia persists.     ASSESSMENT/PLAN Andrea [name] is a 57 y.o. female with history of difficult to control hypertension, insulin-dependent diabetes, obesity, hypothyroidism status post ablation presenting with chest discomfort, right-sided weakness, slurred speech and right facial droop. She did not receive IV t-PA due to late presentation. S/P stent assisted angioplasty of distal basilar artery.  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 Worsening symptoms with extension of pontine/medullary infarcts with new small bilateral cerebellar infarcts without evidence of stent re-stenosis or occlusion  Resultant quadriplegia, on trach and peg  CT head - No acute intracranial abnormalities.  CTA H&N 08/21/2018 - Extensive atherosclerotic disease in the basilar with focal  critical stenosis in the mid to distal basilar. No definite acute thrombus identified. Severe stenosis left posterior cerebral artery  MRI head - Acute nonhemorrhagic infarct involving the left paramedian brainstem.   CTA H&N 08/15/2018 - stent too dense to see BA lumen, but distal flow preserved. New small cerebellar infarcts since brain MRI yesterday.  Severe left P2 segment stenosis, also seen previously.   Repeat MRI - extension of b/l pontine and upper medullary infarcts with new b/l cerebellar infarcts.  2D Echo - EF 60-65%  LDL - 174  HgbA1c - 11.1  VTE prophylaxis - heparin subq  aspirin 81 mg daily prior to admission, now resumed on Brilinta 90 mg bid and ASA 81 mg daily.   Patient counseled to be compliant with Andrea [name] antithrombotic medications  Ongoing aggressive stroke risk factor management  Therapy recommendations:  SNF  Disposition:  palliative care service has discussed with pt using speaking valve and clearly pt would like full aggressive care. SW still working on the placement, pt received only bed offer in [place] and will plan of d/c on Monday  Abnormal uterine bleeding, improved  Has been following with GYN  Was on megace in the past  CT abd/pelvis 09/03/18 - enlarged fibroid uterus  continue megace 80mg  bid  Continue ASA and brilinta for now  OBGYN recs appreciated  Received prmarin 25mg  IV once - vaginal bleeding much improved  Close CBC monitoring  Anemia, improving  Hb 6.8->PRBC->7.1->9.0-7.7->7.0->PRBC->8.6->8.5-> 9.1->9.1->9.8  Likely due to iron deficiency, heavy periods and acute uterine bleeding and abdominal wall surgeries  Iron panel showed iron deficiency  On iron solutions  PRBC transfusion 1 U 10/03/2018 and 2U 09/07/18 and 2U 09/21/18  CBC monitoring  Intracranial stenosis  BA mid to distal severe stenosis s/p BA stenting  Left PCA severe stenosis, R PCA moderate stenosis  Left MCA moderate stenosis  Uncontrolled  stroke risk factors with HLD, HTN, DM  Non compliance with meds at home  Respiratory failure  S/p trach 09/02/2018  On trach collar now  Able to speak with speaking valve although dysarthric  Trach secretion improving   Not a candidate for decannulation at this time due to weak cough  Will continue to follow up with CCM and RT   Hypertension  Stable so far  BP goal 130-150   On Coreg 25 bid, amlodipine and hydralazine  Close monitoring  Hyperlipidemia  Lipid lowering medication PTA:  Lipitor 20 mg daily  LDL 174, goal < 70  Now on Lipitor 80 mg daily  Continue statin at discharge  Diabetes  HgbA1c 11.4, goal < 7.0  Uncontrolled at home  Glucose stable after insulin adjustment  Decrease Lantus again today to 15U bid   Decrease basal  NovoLog again today to 3U Q4h  SSI  CBG monitoring  Low grade fever with leukocytosis, resolved  Tmax 101.6 -> afebrile  Leukocytosis - 9.1->8.5->11.9->14.0->12.8->13.1->12.6  UA WBC 21-50  CXR unremarkable, improving from prior  Blood cultures negative 10/26   Respiratory cultures - mixed flora   Abdominal abscess culture showed proteus mirabilis  Off antibiotics - completed course  Hypernatremia -> hyponatremia-> hypernatremia  Na 143->145->148  On TF @ 65  Increase free water to 150cc Q4h  Continue BMP monitoring  Dysphagia s/p PEG complicated by abdominal wall abscess  Continue tube feeding @ 45 cc/h  PEG done 08/19/2018  Removed and replaced dislodged PEG 08/20/2018  CT showed Abdominal wall abscess - s/p open surgical drainage 09/03/2018 persistent foul odor discharge. Reexploration 09/20/2018  Culture showed peroteus mirabilis  Off meropenum  Wound debrided appropriately and sutures removed.  No evisceration  Trauma surgery now sign off  Still has weak cough on speech eval  Not candidate for decanulation yet  Diarrhea, resolved  Likely related to TF  Dietitian has made formula  change  Laxatives discontinued  Imodium as needed  Other Stroke Risk Factors  Obesity, Body mass index is 43.52 kg/m., recommend weight loss, diet and exercise as appropriate   Other Active Problems  Hypokalemia - resolved  Thrombocytosis - likely due to anemia, 483->534->496  Hospital day # 58 Patient remains neurologically stable. Plan to transfer to nursing home in [place] on Monday. Family seemed okay with the plan. Greater than 50% time during this 25 minute visit was spent on counseling and coordination of care and discussion with care team Antony Contras, MD Stroke Neurology 10/11/2018 3:11 PM   To contact Stroke Continuity provider, please refer to [email or web address] After hours, contact General Neurology

## 2018-10-11 NOTE — Plan of Care (Signed)
  Problem: Education: Goal: Knowledge of General Education information will improve Description Including pain rating scale, medication(s)/side effects and non-pharmacologic comfort measures Outcome: Progressing   Problem: Health Behavior/Discharge Planning: Goal: Ability to manage health-related needs will improve Outcome: Progressing   Problem: Clinical Measurements: Goal: Ability to maintain clinical measurements within normal limits will improve Outcome: Progressing Goal: Will remain free from infection Outcome: Progressing Goal: Diagnostic test results will improve Outcome: Progressing Goal: Respiratory complications will improve Outcome: Progressing Goal: Cardiovascular complication will be avoided Outcome: Progressing   Problem: Activity: Goal: Risk for activity intolerance will decrease Outcome: Progressing   Problem: Nutrition: Goal: Adequate nutrition will be maintained Outcome: Progressing   Problem: Coping: Goal: Level of anxiety will decrease Outcome: Progressing   Problem: Elimination: Goal: Will not experience complications related to bowel motility Outcome: Progressing Goal: Will not experience complications related to urinary retention Outcome: Progressing   Problem: Pain Managment: Goal: General experience of comfort will improve Outcome: Progressing   Problem: Safety: Goal: Ability to remain free from injury will improve Outcome: Progressing   Problem: Skin Integrity: Goal: Risk for impaired skin integrity will decrease Outcome: Progressing   Problem: Education: Goal: Understanding of cardiac disease, CV risk reduction, and recovery process will improve Outcome: Progressing Goal: Individualized Educational Video(s) Outcome: Progressing   Problem: Activity: Goal: Ability to tolerate increased activity will improve Outcome: Progressing   Problem: Cardiac: Goal: Ability to achieve and maintain adequate cardiovascular perfusion will  improve Outcome: Progressing   Problem: Health Behavior/Discharge Planning: Goal: Ability to safely manage health-related needs after discharge will improve Outcome: Progressing   Problem: Education: Goal: Knowledge of disease or condition will improve Outcome: Progressing Goal: Knowledge of secondary prevention will improve Outcome: Progressing Goal: Knowledge of patient specific risk factors addressed and post discharge goals established will improve Outcome: Progressing Goal: Individualized Educational Video(s) Outcome: Progressing   Problem: Coping: Goal: Will verbalize positive feelings about self Outcome: Progressing Goal: Will identify appropriate support needs Outcome: Progressing   Problem: Health Behavior/Discharge Planning: Goal: Ability to manage health-related needs will improve Outcome: Progressing   Problem: Self-Care: Goal: Ability to participate in self-care as condition permits will improve Outcome: Progressing Goal: Verbalization of feelings and concerns over difficulty with self-care will improve Outcome: Progressing Goal: Ability to communicate needs accurately will improve Outcome: Progressing   Problem: Nutrition: Goal: Risk of aspiration will decrease Outcome: Progressing Goal: Dietary intake will improve Outcome: Progressing   Problem: Ischemic Stroke/TIA Tissue Perfusion: Goal: Complications of ischemic stroke/TIA will be minimized Outcome: Progressing   Problem: Spiritual Needs Goal: Ability to function at adequate level Outcome: Progressing

## 2018-10-12 LAB — GLUCOSE, CAPILLARY
Glucose-Capillary: 119 mg/dL — ABNORMAL HIGH (ref 70–99)
Glucose-Capillary: 143 mg/dL — ABNORMAL HIGH (ref 70–99)

## 2018-10-12 NOTE — Progress Notes (Signed)
Patient has had a good day.  Refuses mouth care but will allow you to clean off her tongue.  Patient refused a bath on my shift but does allow me to pillow wedge her q2. Daughter stayed overnight and will stay again tonight.  Patient sister will return to also spend the night after work.  Patient has on PMV to better communicate and per respiratory therapy "redirects mucous to out of her mouth and not out of the opening in the trach".  Patient is excited about getting to leave tomorrow.

## 2018-10-12 NOTE — Discharge Summary (Addendum)
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 Status post tracheostomy and PEG tube placement   Past Medical History:  Diagnosis Date  . Diabetes mellitus   . Environmental allergies   . Fibroid   . High cholesterol   . Hx of cardiovascular stress test    a. ETT-MV 2/14:  EF 46% (normal by echo), low risk, no ischemia or scar  . Hx of echocardiogram    Echo 2/14: mild LVH, EF 55-60%, Gr 1 diast dysfn, mild LAE  . Hypertension   . Hyperthyroidism    Radioactive iodine ablation   Past Surgical History:  Procedure Laterality Date  . [place], 2001   x 2  . ESOPHAGOGASTRODUODENOSCOPY N/A 08/28/2018   Procedure: ESOPHAGOGASTRODUODENOSCOPY (EGD);  Surgeon: Judeth Horn, MD;  Location: [place];  Service: General;  Laterality: N/A;  bedside  . GASTROSTOMY N/A 08/13/2018   Procedure: INSERTION OF GASTROSTOMY TUBE;  Surgeon: Judeth Horn, MD;  Location: [place];  Service: General;  Laterality: N/A;  . IR ANGIO INTRA EXTRACRAN SEL COM CAROTID INNOMINATE BILAT MOD SED  08/08/2018  . IR ANGIO VERTEBRAL SEL SUBCLAVIAN INNOMINATE UNI R MOD SED  08/10/2018  . IR CT HEAD LTD  08/31/2018  . IR INTRA CRAN STENT  08/05/2018  . LAPAROTOMY N/A 08/27/2018   Procedure: EXPLORATORY LAPAROTOMY;  Surgeon: Judeth Horn, MD;  Location: [place];  Service: General;  Laterality: N/A;  . LAPAROTOMY N/A 09/25/2018   Procedure: EXPLORATORY  LAPAROTOMY;  Surgeon: Judeth Horn, MD;  Location: [place];  Service: General;  Laterality: N/A;  . PEG PLACEMENT N/A 08/25/2018   Procedure: PERCUTANEOUS ENDOSCOPIC GASTROSTOMY (PEG) PLACEMENT;  Surgeon: Judeth Horn, MD;  Location: [place];  Service: General;  Laterality: N/A;  . RADIOLOGY WITH ANESTHESIA N/A 08/13/2018   Procedure: IR WITH ANESTHESIA;  Surgeon: Luanne Bras, MD;  Location: [place];  Service: Radiology;  Laterality: N/A;    Allergies as of 10/14/2018      Reactions   Tramadol    "Makes me sees things and I just dont act right"      Medication List    STOP taking these medications   aspirin EC 81 MG tablet Replaced by:  aspirin 81 MG chewable tablet   benzonatate 100 MG capsule Commonly known as:  TESSALON   cloNIDine 0.2 MG tablet Commonly known as:  CATAPRES   ferrous sulfate 325 (65 FE) MG EC tablet Replaced by:  ferrous sulfate 300 (60 Fe) MG/5ML syrup   gabapentin 300 MG capsule Commonly known as:  NEURONTIN Replaced by:  gabapentin 250 MG/5ML solution   hydrochlorothiazide 25 MG tablet Commonly known as:  HYDRODIURIL   ibuprofen 800 MG tablet Commonly known as:  ADVIL,MOTRIN   Insulin Glargine 100 UNIT/ML Solostar Pen Commonly known as:  LANTUS Replaced by:  insulin glargine 100 UNIT/ML injection   Insulin Pen Needle 31G X 8 MM Misc   INSULIN SYRINGE .5CC/30GX1/2" 30G X 1/2" 0.5 ML Misc  losartan 25 MG tablet Commonly known as:  COZAAR   metFORMIN 1000 MG tablet Commonly known as:  GLUCOPHAGE   metoprolol succinate 50 MG 24 hr tablet Commonly known as:  TOPROL-XL   VOLTAREN 1 % Gel Generic drug:  diclofenac sodium     TAKE these medications   acetaminophen 160 MG/5ML solution Commonly known as:  TYLENOL Place 20.3 mLs (650 mg total) into feeding tube every 4 (four) hours as needed for mild pain, headache or fever.   amLODipine 10 MG tablet Commonly known as:  NORVASC Place 1 tablet (10 mg total) into feeding tube  daily. Start taking on:  10/15/2018 What changed:  how to take this   aspirin 81 MG chewable tablet Place 1 tablet (81 mg total) into feeding tube daily. Start taking on:  10/15/2018 Replaces:  aspirin EC 81 MG tablet   atorvastatin 80 MG tablet Commonly known as:  LIPITOR Place 1 tablet (80 mg total) into feeding tube daily at 6 PM. What changed:    medication strength  how much to take  how to take this  when to take this   baclofen 10 MG tablet Commonly known as:  LIORESAL Place 1 tablet (10 mg total) into feeding tube 3 (three) times daily.   barrier cream Crea Commonly known as:  non-specified Apply 1 application topically 2 (two) times daily as needed.   carvedilol 25 MG tablet Commonly known as:  COREG Place 1 tablet (25 mg total) into feeding tube 2 (two) times daily with a meal.   escitalopram 10 MG tablet Commonly known as:  LEXAPRO Place 1 tablet (10 mg total) into feeding tube daily. Start taking on:  10/15/2018   feeding supplement (VITAL AF 1.2 CAL) Liqd Place 1,500 mLs into feeding tube daily. Start taking on:  10/15/2018   ferrous sulfate 300 (60 Fe) MG/5ML syrup Place 5 mLs (300 mg total) into feeding tube 3 (three) times daily with meals. Replaces:  ferrous sulfate 325 (65 FE) MG EC tablet   free water Soln Place 150 mLs into feeding tube every 4 (four) hours.   gabapentin 250 MG/5ML solution Commonly known as:  NEURONTIN Place 2 mLs (100 mg total) into feeding tube every 12 (twelve) hours. Replaces:  gabapentin 300 MG capsule   guaiFENesin 100 MG/5ML Soln Commonly known as:  ROBITUSSIN Place 5 mLs (100 mg total) into feeding tube every 4 (four) hours as needed for cough or to loosen phlegm.   hydrALAZINE 25 MG tablet Commonly known as:  APRESOLINE Place 1 tablet (25 mg total) into feeding tube every 8 (eight) hours.   HYDROcodone-acetaminophen 7.5-325 mg/15 ml solution Commonly known as:  HYCET Place 15 mLs into feeding tube every 4  (four) hours as needed for moderate pain.   insulin aspart 100 UNIT/ML injection Commonly known as:  novoLOG Inject 3 Units into the skin every 4 (four) hours.   insulin glargine 100 UNIT/ML injection Commonly known as:  LANTUS Inject 0.15 mLs (15 Units total) into the skin 2 (two) times daily. Replaces:  Insulin Glargine 100 UNIT/ML Solostar Pen   liver oil-zinc oxide 40 % ointment Commonly known as:  DESITIN Apply topically 4 (four) times daily.   LORazepam 0.5 MG tablet Commonly known as:  ATIVAN Place 0.5 tablets (0.25 mg total) into feeding tube at bedtime.   megestrol 40 MG tablet Commonly known as:  MEGACE Place 2 tablets (80 mg total) into feeding tube 2 (two) times daily. What changed:  See the new  instructions.   multivitamin Liqd Place 15 mLs into feeding tube daily. Start taking on:  10/15/2018   nystatin 100000 UNIT/ML suspension Commonly known as:  MYCOSTATIN Take 5 mLs (500,000 Units total) by mouth 4 (four) times daily.   pantoprazole sodium 40 mg/20 mL Pack Commonly known as:  PROTONIX Place 20 mLs (40 mg total) into feeding tube daily. Start taking on:  10/15/2018   thiamine 100 MG tablet Place 1 tablet (100 mg total) into feeding tube daily. Start taking on:  10/15/2018   ticagrelor 90 MG Tabs tablet Commonly known as:  BRILINTA Place 1 tablet (90 mg total) into feeding tube 2 (two) times daily.       HOME MEDICATIONS PRIOR TO ADMISSION Medications Prior to Admission  Medication Sig Dispense Refill  . amLODipine (NORVASC) 10 MG tablet Take 1 tablet (10 mg total) by mouth daily. 90 tablet 3  . aspirin EC 81 MG tablet Take 81 mg by mouth daily.    Marland Kitchen atorvastatin (LIPITOR) 20 MG tablet Take 1 tablet (20 mg total) by mouth daily. 90 tablet 3  . benzonatate (TESSALON) 100 MG capsule Take 1 capsule (100 mg total) by mouth every 8 (eight) hours. 21 capsule 0  . cloNIDine (CATAPRES) 0.2 MG tablet Take 1.5 tablets (0.3 mg total) by mouth 2 (two) times  daily. 60 tablet 0  . ferrous sulfate 325 (65 FE) MG EC tablet Take 1 tablet (325 mg total) by mouth 3 (three) times daily with meals. 100 tablet 3  . gabapentin (NEURONTIN) 300 MG capsule Take 1 capsule (300 mg total) by mouth 3 (three) times daily. (Patient taking differently: Take 300 mg by mouth 3 (three) times daily as needed (nerve pain). ) 90 capsule 0  . hydrochlorothiazide (HYDRODIURIL) 25 MG tablet TAKE 1 TABLET (25 MG TOTAL) BY MOUTH DAILY. 90 tablet 2  . ibuprofen (ADVIL,MOTRIN) 800 MG tablet Take 1 tablet (800 mg total) by mouth every 8 (eight) hours as needed. (Patient taking differently: Take 800 mg by mouth every 8 (eight) hours as needed for headache or mild pain. ) 21 tablet 0  . Insulin Glargine (LANTUS SOLOSTAR) 100 UNIT/ML Solostar Pen INJECT 27 UNITS INTO THE SKIN AT BEDTIME. 15 pen 3  . losartan (COZAAR) 25 MG tablet Take 1 tablet (25 mg total) by mouth daily. 90 tablet 3  . megestrol (MEGACE) 40 MG tablet TAKE 2 TABLETS BY MOUTH TWICE A DAY , MAY INCREASE TO 3 TABLETS TWICE DAILY FOR HEAVY BLEEDING (Patient taking differently: Take 80 mg by mouth 2 (two) times daily. May increase to 3 tablets twice daily for heavy bleeding) 120 tablet 4  . metFORMIN (GLUCOPHAGE) 1000 MG tablet Take 1 tablet (1,000 mg total) by mouth 2 (two) times daily with a meal. 180 tablet 3  . metoprolol succinate (TOPROL XL) 50 MG 24 hr tablet Take 1 tablet (50 mg total) by mouth daily. Take with or immediately following a meal. 90 tablet 3  . VOLTAREN 1 % GEL APPLY 2 G TOPICALLY 4 (FOUR) TIMES DAILY. (Patient taking differently: Apply 2 g topically 4 (four) times daily as needed (pain). ) 100 g 2  . Insulin Pen Needle (B-D ULTRAFINE III SHORT PEN) 31G X 8 MM MISC USE AS DIRECTED TO INJECT INSULIN AT BEDTIME 100 each 2  . Insulin Syringe-Needle U-100 (INSULIN SYRINGE .5CC/30GX1/2") 30G X 1/2" 0.5 ML MISC Use to inject insulin daily as prescribed 100 each 3     HOSPITAL MEDICATIONS . [START ON 10/15/2018]  amLODipine  10 mg Per Tube Daily  . aspirin  81 mg Per Tube Daily  . atorvastatin  80 mg Per Tube q1800  . baclofen  10 mg Per Tube TID  . carvedilol  25 mg Per Tube BID WC  . chlorhexidine  15 mL Mouth Rinse BID  . escitalopram  10 mg Per Tube Daily  . feeding supplement (VITAL AF 1.2 CAL)  1,500 mL Per Tube Q24H  . ferrous sulfate  300 mg Per Tube TID WC  . free water  150 mL Per Tube Q4H  . gabapentin  100 mg Per Tube Q12H  . hydrALAZINE  25 mg Per Tube Q8H  . insulin aspart  0-20 Units Subcutaneous Q4H  . insulin aspart  3 Units Subcutaneous Q4H  . insulin glargine  15 Units Subcutaneous BID  . liver oil-zinc oxide   Topical QID  . LORazepam  0.25 mg Per Tube QHS  . mouth rinse  15 mL Mouth Rinse q12n4p  . megestrol  80 mg Per Tube BID  . multivitamin  15 mL Per Tube Daily  . nystatin  5 mL Oral QID  . pantoprazole sodium  40 mg Per Tube Daily  . thiamine  100 mg Per Tube Daily  . ticagrelor  90 mg Per Tube BID    LABORATORY STUDIES CBC    Component Value Date/Time   WBC 10.4 10/13/2018 1126   RBC 3.89 10/13/2018 1126   HGB 9.8 (L) 10/13/2018 1126   HCT 34.0 (L) 10/13/2018 1126   PLT 508 (H) 10/13/2018 1126   MCV 87.4 10/13/2018 1126   MCH 25.2 (L) 10/13/2018 1126   MCHC 28.8 (L) 10/13/2018 1126   RDW 17.8 (H) 10/13/2018 1126   LYMPHSABS 1.9 09/20/2018 0323   MONOABS 0.7 09/20/2018 0323   EOSABS 0.1 09/20/2018 0323   BASOSABS 0.0 09/20/2018 0323   CMP    Component Value Date/Time   NA 143 10/13/2018 1126   K 3.9 10/13/2018 1126   CL 112 (H) 10/13/2018 1126   CO2 21 (L) 10/13/2018 1126   GLUCOSE 181 (H) 10/13/2018 1126   BUN 30 (H) 10/13/2018 1126   CREATININE 0.72 10/13/2018 1126   CREATININE 0.91 04/09/2016 1213   CALCIUM 11.3 (H) 10/13/2018 1126   PROT 7.8 01/23/2018 1352   ALBUMIN 1.9 (L) 09/20/2018 0323   AST 14 (L) 01/23/2018 1352   ALT 11 (L) 01/23/2018 1352   ALKPHOS 92 01/23/2018 1352   BILITOT 0.8 01/23/2018 1352   GFRNONAA >60 10/13/2018  1126   GFRNONAA 71 04/09/2016 1213   GFRAA >60 10/13/2018 1126   GFRAA 82 04/09/2016 1213   COAGS Lab Results  Component Value Date   INR 1.20 08/12/2018   INR 1.12 08/18/2018   INR 1.02 08/15/2018   Lipid Panel    Component Value Date/Time   CHOL 256 (H) 08/15/2018 0512   TRIG 248 (H) 08/15/2018 0512   HDL 35 (L) 08/15/2018 0512   CHOLHDL 7.3 08/15/2018 0512   VLDL 50 (H) 08/15/2018 0512   LDLCALC 171 (H) 08/15/2018 0512   HgbA1C  Lab Results  Component Value Date   HGBA1C 11.4 (H) 08/15/2018   Urinalysis    Component Value Date/Time   COLORURINE YELLOW 08/30/2018 0055   APPEARANCEUR CLOUDY (A) 08/30/2018 0055   LABSPEC 1.015 08/30/2018 0055   PHURINE 5.0 08/30/2018 0055   GLUCOSEU NEGATIVE 08/30/2018 0055   HGBUR SMALL (A) 08/30/2018 0055   BILIRUBINUR NEGATIVE 08/30/2018 0055   BILIRUBINUR NEGATIVE  02/10/2014 [place] 08/30/2018 0055   PROTEINUR NEGATIVE 08/30/2018 0055   UROBILINOGEN 1.0 05/02/2015 2230   NITRITE NEGATIVE 08/30/2018 0055   LEUKOCYTESUR MODERATE (A) 08/30/2018 0055   Urine Drug Screen     Component Value Date/Time   LABOPIA NONE DETECTED 08/22/2018 1836   COCAINSCRNUR NONE DETECTED 08/12/2018 1836   LABBENZ NONE DETECTED 08/05/2018 1836   AMPHETMU NONE DETECTED 08/11/2018 1836   THCU NONE DETECTED 08/10/2018 1836   LABBARB NONE DETECTED 08/11/2018 1836    Alcohol Level No results found for: ETH   SIGNIFICANT DIAGNOSTIC STUDIES  Ct Angio Head W Or Wo Contrast Ct Angio Neck W Or Wo Contrast 08/15/2018 IMPRESSION:  1. Interval basilar to left proximal PCA stenting. The density of the stent walls precludes detection of in stent stenosis; there is a degree of wasting at the mid basilar segment. There is flow in the left PCA beyond the stent such that there is presumed stent patency.  2. Known lower pontine infarct. There are new small cerebellar infarcts since brain MRI yesterday.  3. Severe left P2 segment stenosis,  also seen previously.  4. Moderate atheromatous narrowing in the proximal left MCA.   Ct Angio Head W Or Wo Contrast Ct Angio Neck W Or Wo Contrast 08/27/2018 IMPRESSION:  1. 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 No definite acute thrombus identified.  2. Severe stenosis left posterior cerebral artery and moderate stenosis right posterior cerebral artery.  3. Mild stenosis left MCA and moderate stenosis left MCA bifurcation.  4. No significant carotid or vertebral artery stenosis in the neck.   Ct Head Wo Contrast 08/21/2018 IMPRESSION:  No acute intracranial abnormalities. Mild white matter changes likely due to small vessel ischemia.   Mr Brain Wo Contrast 08/25/2018 IMPRESSION:  1. Acute nonhemorrhagic infarct involving the left paramedian brainstem. The infarct crosses midline.  2. Other periventricular and subcortical white matter disease is moderately advanced for age. This likely reflects the sequela of chronic microvascular ischemia.  3. Tapering of the dens with prominent soft tissue pannus. 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   Ct Head Code Stroke Wo Contrast 08/21/2018  IMPRESSION:  1. No acute abnormality and no change from earlier today  2. ASPECTS is 10 3.   Cerebral Angiogram 08/15/2018 S/P 4 vessel cerebral arteriogram RT CFA approach. 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 S/P stent assisted angioplasty of distal basilar artery with patency of 80 to 90 %. 2.Approx 70 % stenosis of LT ICA supraclinoid seg  MRI 08/15/18 1. Progressive acute pontine infarct. New patchy bilateral cerebellar infarction. New tiny left thalamic infarcts. 2. Preserved flow void in the stented basilar. 3. left facial/submandibular edema, please correlate with neck exam. Edited  result: IMPRESSION: 1. Progressive acute pontine infarct. New patchy bilateral cerebellar infarction. New tiny left thalamic infarcts. 2. Preserved flow void in the stented basilar.  TTE - Left ventricle: The cavity size was normal. There was severeconcentric hypertrophy. Systolic function was normal. Theestimated ejection fraction was in the range of 60% to 65%. Wallmotion was normal; there were no regional wall motionabnormalities. Doppler parameters are consistent with abnormalleft ventricular relaxation (grade 1 diastolic dysfunction). TheE/e&' ratio is <8, suggesting normal LV filling pressure. - Aortic valve: Trileaflet; mildly calcified leaflets.Transvalvular velocity was minimally increased. There was noregurgitation. Mean gradient (S): 12 mm  Hg. - Mitral valve: Mildly thickened leaflets . There was trivialregurgitation. - Left atrium: The atrium was normal in size. - Inferior vena cava: The vessel was dilated. The respirophasicdiameter changes were blunted (<50%), consistent with elevatedcentral venous pressure. Impressions: - LVEF 60-65%, severe LVH, normal wall motion, grade 1 DD, normal  LV filling pressure, minimally increased aortic velocity withoutsignficant stenosis, trivial MR, normal LA size, dilated IVC.  CT abdomen and pelvis 09/03/18 Mild bilateral posterior basilar subsegmental atelectasis.Interval placement of new gastrostomy tube with tip and balloon within gastric lumen. Surgical drain is seen in the soft tissues in previous gastrostomy site. Small amount of gas and stranding is seen in the peritoneal fat anterior and inferior to the stomach which most likely is related to gastrostomy placement. Probable surgical wound seen in right upper quadrant of the anterior abdominal wall. 8.2 x 2.3 cm gas and fluid collection is seen in the anterior abdominal wall which is unchanged compared to prior exam. Possible abscess cannot be excluded. Enlarged fibroid  uterus.     HISTORY OF PRESENT ILLNESS DARSHA ZUMSTEIN is a 57 y.o. female past medical history for difficult to control hypertension, insulin-dependent diabetes, obesity, hypothyroidism status post ablation presented to the ED for evaluation of chest pain at [facility] last night and was sent over to [facility] for further work-up and evaluation this morning.  Upon assessment by the nursing staff and the medical staff this morning, she was noted to have slurred speech and right-sided weakness, for which a code stroke was called.  During the initial evaluation in the code stroke paged out, last known normal was unknown. Upon obtaining history from patient, she reports that she could not sleep last night and around 3 AM noticed that she has some chest discomfort and right-sided weakness and also noted that her speech was slurred.  She was taken to [facility] where she underwent evaluation-CT head, labs EKG.  She was noted to have an elevated blood pressure of 220/117 with an EKG revealing sinus rhythm and left ventricular hypertrophy.  Her chest x-ray was notable for cardiomegaly and vascular congestion but nothing acute that would explain chest pain.  Her troponin was negative.  She was admitted as a ACS rule out/work-up. On the floor this morning, the nursing staff around the time the code stroke was called, noted that she was having more difficulty talking and her speech had become extremely thick and slurred.  She also exhibited right facial droop.  She was weak on her right arm and leg as well. On initial assessment in her room, the patient was extremely dysarthric and had right hemiparesis with right facial droop.  No sensory changes, no cortical signs. She was taken for a noncontrast CT of the head which is unremarkable and unchanged from the one obtained at [facility]. CTA head and neck was done because of the concern for a posterior circulation stroke which  revealed a critical stenosis in the mid basilar.  Her symptoms during this course faxed and vein.  Discussions were made with interventional radiology, Dr. Estanislado Pandy and patient was wheeled to the IR suite for a possible emergent cerebral angiogram followed by angioplasty/stenting of the basilar by in the IR suite, prior to being on the table, patient symptoms started to improve and her facial droop had disappeared and her leg strength started to improve.  Then the decision was made to pursue an MRI prior to taking her for a cerebral angiogram to  ascertain whether this is a brainstem stroke or a subcortical lacunar infarct that was fluctuating. She was taken in for the MRI stat and it revealed an evolving pontine stroke.  She was brought back to the interventional radiology suite and consented for diagnostic cerebral angiogram and basilar artery revascularization.  Currently the patient is on the table at the time of this dictation  LKW: 0300 08/08/2018 tpa given?: no, outside the window Premorbid modified Rankin scale (mRS): 1  HOSPITAL COURSE Ms. CASSIDY TASHIRO is a 57 y.o. female with history of difficult to control hypertension, insulin-dependent diabetes, obesity, hypothyroidism status post ablation presenting with chest discomfort, right-sided weakness, slurred speech and right facial droop. She did not receive IV t-PA due to late presentation. S/P stent assisted angioplasty of distal basilar artery.  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 Worsening symptoms with extension of pontine/medullary infarcts with new small bilateral cerebellar infarcts without evidence of stent re-stenosis or occlusion  CT head - No acute intracranial abnormalities.  CTA H&N 08/07/2018 - Extensive atherosclerotic disease in the basilar with focal critical stenosis in the mid to distal basilar. No definite acute thrombus identified. Severe stenosis left posterior  cerebral artery  MRI head - Acute nonhemorrhagic infarct involving the left paramedian brainstem.   CTA H&N 08/15/2018 - stent too dense to see BA lumen, but distal flow preserved. New small cerebellar infarcts since brain MRI yesterday.  Severe left P2 segment stenosis, also seen previously.   Repeat MRI - extension of b/l pontine and upper medullary infarcts with new b/l cerebellar infarcts.  2D Echo - EF 60-65%  LDL - 174  HgbA1c - 11.1  aspirin 81 mg daily prior to admission, now resumed on Brilinta 90 mg bid and ASA 81 mg daily.   Therapy recommendations:  SNF  palliative care service has discussed with pt using speaking valve and clearly pt would like full aggressive care.   Disposition:  SNF  Abnormal uterine bleeding, improved  Has been following with GYN  Was on megace in the past  CT abd/pelvis 09/03/18 - enlarged fibroid uterus  continue megace 80mg  bid  Continue ASA and brilinta for now  OBGYN recs appreciated  Received prmarin 25mg  IV once - vaginal bleeding much improved  Anemia, improving  Hb 6.8->PRBC->7.1--->9.8  Likely due to iron deficiency, heavy periods and acute uterine bleeding and abdominal wall surgeries  Iron panel showed iron deficiency  On iron solutions  PRBC transfusion 1 U 09/15/2018 and 2U 09/07/18 and 2U 09/21/18  Intracranial stenosis  BA mid to distal severe stenosis s/p BA stenting  Left PCA severe stenosis, R PCA moderate stenosis  Left MCA moderate stenosis  Uncontrolled stroke risk factors with HLD, HTN, DM  Non compliance with meds at home  Respiratory failure  S/p trach 08/08/2018  On trach collar now  Able to speak with speaking valve although dysarthric  Trach secretion improving   Not a candidate for decannulation at this time due to weak cough  Hypertension  Stable so far  BP goal 130-150   On Coreg 25 bid, amlodipine and hydralazine  Hyperlipidemia  Lipid lowering medication PTA:   Lipitor 20 mg daily  LDL 174, goal < 70  Now on Lipitor 80 mg daily  Continue statin at discharge  Diabetes  Glucoses stable  HgbA1c 11.4, goal < 7.0  Uncontrolled at home  insulin adjustment as Inpatient  Lantus 15U bid   Basal NovoLog 3U Q4h  CBG monitoring  Low grade fever with leukocytosis, resolved  Tmax 101.6 -> afebrile  WBC now 10.4 (was elevated)  UA WBC 21-50  CXR unremarkable, improving from prior  Blood cultures negative 10/26   Respiratory cultures - mixed flora   Abdominal abscess culture showed proteus mirabilis  Off antibiotics - completed course  Hypernatremia -> hyponatremia-> hypernatremia  Na 143->145->143  On TF @ 65  On free water 150cc Q4h  Dysphagia s/p PEG complicated by abdominal wall abscess  Continue tube feeding @ 45 cc/h  PEG done 08/27/2018  Removed and replaced dislodged PEG 08/25/2018  CT showed Abdominal wall abscess - s/p open surgical drainage 08/19/2018 persistent foul odor discharge. Reexploration 09/10/2018  Culture showed peroteus mirabilis  Off meropenum  Wound debrided appropriately and sutures removed. No evisceration  Trauma surgery now sign off  Still has weak cough on speech eval  Not candidate for decanulation yet  ccurrently receiving ocntinuous TF and all meds via PEG  Passe FEES 10/13/2018 with approx 25% food intake  Wean TF as appropriate. SLP follow up.  Diarrhea, resolved  Likely related to TF  Dietitian has made formula change  Laxatives discontinued  Imodium as needed  Other Stroke Risk Factors  Obesity, Body mass index is 43.52 kg/m., recommend weight loss, diet and exercise as appropriate   Other Active Problems  Hypokalemia - resolved  Thrombocytosis - likely due to anemia, 483->534->496->508  DISCHARGE EXAM per Dr. Leonie Man General -  Obese middle-aged African-American lady, on trach collar, not in distress. She has abdominal surgical wound and drainage from the  abdominal wall, dressing clean Cardiovascular - regular rate and rhythm Neuro - on trach collar, eyes open, awake, alert. No ptosis or EOMI, denies diplopia.  PERRL. Bilateral facial weakness, tongue protrusion weak. Able to wiggle left hand fingers and left foot toes. 2-/5 LLE and 2/5 LUE. Follows commands and good eye contact.  Speech is moderate dysarthric with speaking valve.   Dense right hemiplegia persists.  Discharge Diet   Dysphagia I nectar thick liquids  DISCHARGE PLAN  Disposition:  Discharge to skilled nursing facility  aspirin 81 mg daily and Brilinta 90 mg twice daily for secondary stroke prevention.  Ongoing risk factor control by Primary Care Physician at time of discharge  Follow-up Danna Hefty, DO in 2 weeks or MD at SNF  Follow-up in [facility] in 4 weeks, office to schedule an appointment.   Total 60 minutes were spent preparing discharge.  Burnetta Sabin, MSN, APRN, ANVP-BC, AGPCNP-BC Advanced Practice Stroke Nurse [place] for Schedule & Pager information 10/14/2018 12:15 PM  I have personally obtained history,examined this patient, reviewed notes, independently viewed imaging studies, participated in medical decision making and plan of care.ROS completed by me personally and pertinent positives fully documented  I have made any additions or clarifications directly to the above note. Agree with note above.    Antony Contras, MD Medical Director [facility] Pager: [phone] 10/14/2018 2:54 PM

## 2018-10-12 NOTE — Progress Notes (Signed)
STROKE TEAM PROGRESS NOTE   SUBJECTIVE (INTERVAL HISTORY) Her daughter is  at the bedside.  Dr. Leonie Man discussed discharge to a skilled nursing facility tomorrow.  The patient appears to be slowly improving and would no doubt benefit from more intensive rehab.   OBJECTIVE Vitals:   10/12/18 0422 10/12/18 0800 10/12/18 1133 10/12/18 1200  BP:  (!) 154/85 124/81 123/78  Pulse: (!) 54 74 69 68  Resp: 17 13 13 12   Temp:  97.8 F (36.6 C) 97.6 F (36.4 C)   TempSrc:  Oral Oral Oral  SpO2: 100% 97% 98% 99%  Weight:      Height:        CBC:  Recent Labs  Lab 10/08/18 0501 10/10/18 1155  WBC 13.1* 12.6*  HGB 9.1* 9.8*  HCT 31.6* 33.5*  MCV 86.8 86.8  PLT 534* 496*    Basic Metabolic Panel:  Recent Labs  Lab 10/08/18 0501 10/10/18 1155  NA 145 148*  K 4.0 4.2  CL 113* 115*  CO2 25 22  GLUCOSE 164* 128*  BUN 31* 33*  CREATININE 0.70 0.74  CALCIUM 11.1* 11.7*    Lipid Panel:     Component Value Date/Time   CHOL 256 (H) 08/15/2018 0512   TRIG 248 (H) 08/15/2018 0512   HDL 35 (L) 08/15/2018 0512   CHOLHDL 7.3 08/15/2018 0512   VLDL 50 (H) 08/15/2018 0512   LDLCALC 171 (H) 08/15/2018 0512   HgbA1c:  Lab Results  Component Value Date   HGBA1C 11.4 (H) 08/15/2018   Urine Drug Screen:     Component Value Date/Time   LABOPIA NONE DETECTED 08/06/2018 1836   COCAINSCRNUR NONE DETECTED 08/06/2018 1836   LABBENZ NONE DETECTED 08/20/2018 1836   AMPHETMU NONE DETECTED 08/17/2018 1836   THCU NONE DETECTED 08/21/2018 1836   LABBARB NONE DETECTED 08/09/2018 1836    Alcohol Level No results found for: ETH  IMAGING  Ct Angio Head W Or Wo Contrast Ct Angio Neck W Or Wo Contrast 08/15/2018 IMPRESSION:  1. Interval basilar to left proximal PCA stenting. The density of the stent walls precludes detection of in stent stenosis; there is a degree of wasting at the mid basilar segment. There is flow in the left PCA beyond the stent such that there is presumed stent patency.   2. Known lower pontine infarct. There are new small cerebellar infarcts since brain MRI yesterday.  3. Severe left P2 segment stenosis, also seen previously.  4. Moderate atheromatous narrowing in the proximal left MCA.    Ct Angio Head W Or Wo Contrast Ct Angio Neck W Or Wo Contrast 08/22/2018 IMPRESSION:  1. 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 No definite acute thrombus identified.  2. Severe stenosis left posterior cerebral artery and moderate stenosis right posterior cerebral artery.  3. Mild stenosis left MCA and moderate stenosis left MCA bifurcation.  4. No significant carotid or vertebral artery stenosis in the neck.    Ct Head Wo Contrast 08/16/2018 IMPRESSION:  No acute intracranial abnormalities. Mild white matter changes likely due to small vessel ischemia.    Mr Brain Wo Contrast 08/06/2018 IMPRESSION:  1. Acute nonhemorrhagic infarct involving the left paramedian brainstem. The infarct crosses midline.  2. Other periventricular and subcortical white matter disease is moderately advanced for age. This likely reflects the sequela of chronic microvascular ischemia.  3. Tapering of the dens with prominent soft tissue pannus. 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  Ct Head Code Stroke Wo Contrast 08/07/2018  IMPRESSION:  1. No acute abnormality and no change from earlier today  2. ASPECTS is 10 3.    Cerebral Angiogram 08/30/2018 S/P 4 vessel cerebral arteriogram RT CFA approach. 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 S/P stent assisted angioplasty of distal basilar artery with patency of 80 to 90 %. 2.Approx 70 % stenosis of LT ICA supraclinoid seg   MRI 08/15/18 1. Progressive acute pontine infarct. New patchy bilateral cerebellar infarction. New tiny left thalamic  infarcts. 2. Preserved flow void in the stented basilar. 3. left facial/submandibular edema, please correlate with neck exam. Edited result: IMPRESSION: 1. Progressive acute pontine infarct. New patchy bilateral cerebellar infarction. New tiny left thalamic infarcts. 2. Preserved flow void in the stented basilar.  TTE - Left ventricle: The cavity size was normal. There was severe   concentric hypertrophy. Systolic function was normal. The   estimated ejection fraction was in the range of 60% to 65%. Wall   motion was normal; there were no regional wall motion   abnormalities. Doppler parameters are consistent with abnormal   left ventricular relaxation (grade 1 diastolic dysfunction). The   E/e&' ratio is <8, suggesting normal LV filling pressure. - Aortic valve: Trileaflet; mildly calcified leaflets.   Transvalvular velocity was minimally increased. There was no   regurgitation. Mean gradient (S): 12 mm Hg. - Mitral valve: Mildly thickened leaflets . There was trivial   regurgitation. - Left atrium: The atrium was normal in size. - Inferior vena cava: The vessel was dilated. The respirophasic   diameter changes were blunted (< 50%), consistent with elevated   central venous pressure. Impressions: - LVEF 60-65%, severe LVH, normal wall motion, grade 1 DD, normal   LV filling pressure, minimally increased aortic velocity without   signficant stenosis, trivial MR, normal LA size, dilated IVC.  CT abdomen and pelvis 09/03/18 Mild bilateral posterior basilar subsegmental atelectasis. Interval placement of new gastrostomy tube with tip and balloon within gastric lumen. Surgical drain is seen in the soft tissues in previous gastrostomy site. Small amount of gas and stranding is seen in the peritoneal fat anterior and inferior to the stomach which most likely is related to gastrostomy placement. Probable surgical wound seen in right upper quadrant of the anterior abdominal wall. 8.2 x  2.3 cm gas and fluid collection is seen in the anterior abdominal wall which is unchanged compared to prior exam. Possible abscess cannot be excluded. Enlarged fibroid uterus.  PHYSICAL EXAM  Temp:  [97.6 F (36.4 C)-98.7 F (37.1 C)] 97.6 F (36.4 C) (12/08 1133) Pulse Rate:  [54-79] 68 (12/08 1200) Resp:  [12-25] 12 (12/08 1200) BP: (123-161)/(69-89) 123/78 (12/08 1200) SpO2:  [94 %-100 %] 99 % (12/08 1200) FiO2 (%):  [21 %] 21 % (12/08 0422)  General -  Obese middle-aged African-American lady, on trach collar, not in distress. She has abdominal surgical wound and drainage from the abdominal wall, dressing clean  Ophthalmologic - fundi not visualized due to noncooperation.  Cardiovascular - regular rate and rhythm  Neuro - on trach collar, eyes open, awake, alert. No ptosis or EOMI, denies diplopia.  PERRL. Bilateral facial weakness, tongue protrusion weak. Able to wiggle left hand fingers and left foot toes. 2-/5 LLE and 2/5 LUE. Follows commands and good eye contact.  Speech is moderate dysarthric with speaking valve.   Dense right  hemiplegia persists.     ASSESSMENT/PLAN Ms. SHAN PADGETT is a 57 y.o. female with history of difficult to control hypertension, insulin-dependent diabetes, obesity, hypothyroidism status post ablation presenting with chest discomfort, right-sided weakness, slurred speech and right facial droop. She did not receive IV t-PA due to late presentation. S/P stent assisted angioplasty of distal basilar artery.  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 Worsening symptoms with extension of pontine/medullary infarcts with new small bilateral cerebellar infarcts without evidence of stent re-stenosis or occlusion  Resultant quadriplegia, on trach and peg  CT head - No acute intracranial abnormalities.  CTA H&N 08/17/2018 - Extensive atherosclerotic disease in the basilar with focal critical stenosis in the mid  to distal basilar. No definite acute thrombus identified. Severe stenosis left posterior cerebral artery  MRI head - Acute nonhemorrhagic infarct involving the left paramedian brainstem.   CTA H&N 08/15/2018 - stent too dense to see BA lumen, but distal flow preserved. New small cerebellar infarcts since brain MRI yesterday.  Severe left P2 segment stenosis, also seen previously.   Repeat MRI - extension of b/l pontine and upper medullary infarcts with new b/l cerebellar infarcts.  2D Echo - EF 60-65%  LDL - 174  HgbA1c - 11.1  VTE prophylaxis - heparin subq  aspirin 81 mg daily prior to admission, now resumed on Brilinta 90 mg bid and ASA 81 mg daily.   Patient counseled to be compliant with her antithrombotic medications  Ongoing aggressive stroke risk factor management  Therapy recommendations:  SNF  Disposition:  palliative care service has discussed with pt using speaking valve and clearly pt would like full aggressive care. SW still working on the placement, pt received only bed offer in [place] and will plan of d/c on Monday  Abnormal uterine bleeding, improved  Has been following with GYN  Was on megace in the past  CT abd/pelvis 09/03/18 - enlarged fibroid uterus  continue megace 80mg  bid  Continue ASA and brilinta for now  OBGYN recs appreciated  Received prmarin 25mg  IV once - vaginal bleeding much improved  Close CBC monitoring  Anemia, improving  Hb 6.8->PRBC->7.1->9.0-7.7->7.0->PRBC->8.6->8.5-> 9.1->9.1->9.8  Likely due to iron deficiency, heavy periods and acute uterine bleeding and abdominal wall surgeries  Iron panel showed iron deficiency  On iron solutions  PRBC transfusion 1 U 09/26/2018 and 2U 09/07/18 and 2U 09/21/18  CBC monitoring  Intracranial stenosis  BA mid to distal severe stenosis s/p BA stenting  Left PCA severe stenosis, R PCA moderate stenosis  Left MCA moderate stenosis  Uncontrolled stroke risk factors with HLD, HTN,  DM  Non compliance with meds at home  Respiratory failure  S/p trach 08/20/2018  On trach collar now  Able to speak with speaking valve although dysarthric  Trach secretion improving   Not a candidate for decannulation at this time due to weak cough  Will continue to follow up with CCM and RT   Hypertension  Stable so far  BP goal 130-150   On Coreg 25 bid, amlodipine and hydralazine  Close monitoring  Hyperlipidemia  Lipid lowering medication PTA:  Lipitor 20 mg daily  LDL 174, goal < 70  Now on Lipitor 80 mg daily  Continue statin at discharge  Diabetes  HgbA1c 11.4, goal < 7.0  Uncontrolled at home  Glucose stable after insulin adjustment  Decrease Lantus again today to 15U bid   Decrease basal NovoLog again today to 3U Q4h  SSI  CBG monitoring  Low grade fever with leukocytosis, resolved  Tmax 101.6 -> afebrile  Leukocytosis - 9.1->8.5->11.9->14.0->12.8->13.1->12.6  UA WBC 21-50  CXR unremarkable, improving from prior  Blood cultures negative 10/26   Respiratory cultures - mixed flora   Abdominal abscess culture showed proteus mirabilis  Off antibiotics - completed course  Hypernatremia -> hyponatremia-> hypernatremia  Na 143->145->148  On TF @ 65  Increase free water to 150cc Q4h  Continue BMP monitoring  Dysphagia s/p PEG complicated by abdominal wall abscess  Continue tube feeding @ 45 cc/h  PEG done 09/04/2018  Removed and replaced dislodged PEG 08/16/2018  CT showed Abdominal wall abscess - s/p open surgical drainage 08/08/2018 persistent foul odor discharge. Reexploration 09/19/2018  Culture showed peroteus mirabilis  Off meropenum  Wound debrided appropriately and sutures removed.  No evisceration  Trauma surgery now sign off  Still has weak cough on speech eval  Not candidate for decanulation yet  Diarrhea, resolved  Likely related to TF  Dietitian has made formula change  Laxatives discontinued  Imodium  as needed  Other Stroke Risk Factors  Obesity, Body mass index is 43.52 kg/m., recommend weight loss, diet and exercise as appropriate   Other Active Problems  Hypokalemia - resolved  Thrombocytosis - likely due to anemia, 483->534->496  Hospital day # [address] PA-C [facility] Pager [phone] 10/12/2018, 2:02 PM I have personally obtained history,examined this patient, reviewed notes, independently viewed imaging studies, participated in medical decision making and plan of care.ROS completed by me personally and pertinent positives fully documented  I have made any additions or clarifications directly to the above note. Agree with note above. Marland KitchenAwait transfer to SNF in [facility] tomorrow if bed aviaalble.D/W daughter and answered questions.I have spent a total of  25 minutes with the patient reviewing hospital notes,  test results, labs and examining the patient as well as establishing an assessment and plan that was discussed personally with the patient.  > 50% of time was spent in direct patient care.       Antony Contras, MD Medical Director [facility] Pager: [phone] 10/12/2018 4:07 PM    To contact Stroke Continuity provider, please refer to [email or web address] After hours, contact General Neurology

## 2018-10-13 LAB — GLUCOSE, CAPILLARY
Glucose-Capillary: 113 mg/dL — ABNORMAL HIGH (ref 70–99)
Glucose-Capillary: 198 mg/dL — ABNORMAL HIGH (ref 70–99)
Glucose-Capillary: 171 mg/dL — ABNORMAL HIGH (ref 70–99)
Glucose-Capillary: 171 mg/dL — ABNORMAL HIGH (ref 70–99)
Glucose-Capillary: 136 mg/dL — ABNORMAL HIGH (ref 70–99)

## 2018-10-13 LAB — BASIC METABOLIC PANEL
Anion gap: 10 (ref 5–15)
BUN: 30 mg/dL — ABNORMAL HIGH (ref 6–20)
CALCIUM: 11.3 mg/dL — AB (ref 8.9–10.3)
CO2: 21 mmol/L — ABNORMAL LOW (ref 22–32)
Chloride: 112 mmol/L — ABNORMAL HIGH (ref 98–111)
Creatinine, Ser: 0.72 mg/dL (ref 0.44–1.00)
GFR calc Af Amer: >60 mL/min (ref >60)
GFR calc non Af Amer: >60 mL/min (ref >60)
Glucose, Bld: 181 mg/dL — ABNORMAL HIGH (ref 70–99)
Potassium: 3.9 mmol/L (ref 3.5–5.1)
Sodium: 143 mmol/L (ref 135–145)

## 2018-10-13 LAB — CBC
HCT: 34 % — ABNORMAL LOW (ref 36.0–46.0)
Hemoglobin: 9.8 g/dL — ABNORMAL LOW (ref 12.0–15.0)
MCH: 25.2 pg — ABNORMAL LOW (ref 26.0–34.0)
MCHC: 28.8 g/dL — ABNORMAL LOW (ref 30.0–36.0)
MCV: 87.4 fL (ref 80.0–100.0)
Platelets: 508 10*3/uL — ABNORMAL HIGH (ref 150–400)
RBC: 3.89 MIL/uL (ref 3.87–5.11)
RDW: 17.8 % — ABNORMAL HIGH (ref 11.5–15.5)
WBC: 10.4 10*3/uL (ref 4.0–10.5)
nRBC: 0 % (ref 0.0–0.2)

## 2018-10-13 NOTE — Progress Notes (Signed)
Called [facility] was given Vella Raring number, [phone], because she is not located on campus, went straight to voicemail twice.   Message was left.

## 2018-10-13 NOTE — Progress Notes (Addendum)
10/13/18  Update: Is fine with LOG and needs to make sure that that she has insurance. Edwyna Ready was going to a contract. Needs to make sure that she is going to qualify for insurance. Has pending [place] and disability application was started. Will be getting back to me. Is able to take but needs make sure that her insurance will be approved.       CSW called and left a message with [place], [place], [phone]. Awaiting a return phone call to see if [facility] is able to take the patient.   Domenic Schwab, MSW, [place]

## 2018-10-13 NOTE — Progress Notes (Addendum)
STROKE TEAM PROGRESS NOTE   SUBJECTIVE (INTERVAL HISTORY) Patient excited for planned d/c today. Have left message for social worker to confirm transfer.   OBJECTIVE Vitals:   10/13/18 0400 10/13/18 0700 10/13/18 0922 10/13/18 1234  BP: (!) 150/76 (!) 177/96 (!) 147/94 134/76  Pulse: 70 70 75 70  Resp:   18 16  Temp: 98.1 F (36.7 C) 98.1 F (36.7 C)  97.9 F (36.6 C)  TempSrc:  Oral    SpO2: 98% 100% 98% 100%  Weight:      Height:        CBC:  Recent Labs  Lab 10/10/18 1155 10/13/18 1126  WBC 12.6* 10.4  HGB 9.8* 9.8*  HCT 33.5* 34.0*  MCV 86.8 87.4  PLT 496* 508*    Basic Metabolic Panel:  Recent Labs  Lab 10/10/18 1155 10/13/18 1126  NA 148* 143  K 4.2 3.9  CL 115* 112*  CO2 22 21*  GLUCOSE 128* 181*  BUN 33* 30*  CREATININE 0.74 0.72  CALCIUM 11.7* 11.3*    IMAGING No results found.   PHYSICAL EXAM per Dr. Leonie Man General -  Obese middle-aged African-American lady, on trach collar, not in distress. She has abdominal surgical wound and drainage from the abdominal wall, dressing clean Cardiovascular - regular rate and rhythm Neuro - on trach collar, eyes open, awake, alert. No ptosis or EOMI, denies diplopia.  PERRL. Bilateral facial weakness, tongue protrusion weak. Able to wiggle left hand fingers and left foot toes. 2-/5 LLE and 2/5 LUE. Follows commands and good eye contact.  Speech is moderate dysarthric with speaking valve.   Dense right hemiplegia persists.   ASSESSMENT/PLAN Ms. EMIL KLASSEN is a 57 y.o. female with history of difficult to control hypertension, insulin-dependent diabetes, obesity, hypothyroidism status post ablation presenting with chest discomfort, right-sided weakness, slurred speech and right facial droop. She did not receive IV t-PA due to late presentation. S/P stent assisted angioplasty of distal basilar artery.  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 Worsening symptoms with  extension of pontine/medullary infarcts with new small bilateral cerebellar infarcts without evidence of stent re-stenosis or occlusion  CT head - No acute intracranial abnormalities.  CTA H&N 08/15/2018 - Extensive atherosclerotic disease in the basilar with focal critical stenosis in the mid to distal basilar. No definite acute thrombus identified. Severe stenosis left posterior cerebral artery  MRI head - Acute nonhemorrhagic infarct involving the left paramedian brainstem.   CTA H&N 08/15/2018 - stent too dense to see BA lumen, but distal flow preserved. New small cerebellar infarcts since brain MRI yesterday.  Severe left P2 segment stenosis, also seen previously.   Repeat MRI - extension of b/l pontine and upper medullary infarcts with new b/l cerebellar infarcts.  2D Echo - EF 60-65%  LDL - 174  HgbA1c - 11.1  VTE prophylaxis - heparin subq  aspirin 81 mg daily prior to admission, now resumed on Brilinta 90 mg bid and ASA 81 mg daily.   Therapy recommendations:  SNF  Disposition:  palliative care service has discussed with pt using speaking valve and clearly pt would like full aggressive care. SW still working on the placement, pt received only bed offer in [place] with planned of d/c today - have left message for SW to confirm transfer. D/c paperwork tentative.  Abnormal uterine bleeding, improved  Has been following with GYN  Was on megace in the past  CT abd/pelvis 09/03/18 - enlarged fibroid  uterus  continue megace 80mg  bid  Continue ASA and brilinta for now  OBGYN recs appreciated  Received prmarin 25mg  IV once - vaginal bleeding much improved  Anemia, improving  Hb 6.8->PRBC->7.1--->9.8  Likely due to iron deficiency, heavy periods and acute uterine bleeding and abdominal wall surgeries  Iron panel showed iron deficiency  On iron solutions  PRBC transfusion 1 U 09/30/2018 and 2U 09/07/18 and 2U 09/21/18  Intracranial stenosis  BA mid to distal  severe stenosis s/p BA stenting  Left PCA severe stenosis, R PCA moderate stenosis  Left MCA moderate stenosis  Uncontrolled stroke risk factors with HLD, HTN, DM  Non compliance with meds at home  Respiratory failure  S/p trach 08/07/2018  On trach collar now  Able to speak with [name] valve although dysarthric  Trach secretion improving   Not a candidate for decannulation at this time due to weak cough  Will continue to follow up with CCM and RT   Hypertension  Stable so far  BP goal 130-140   On Coreg 25 bid, amlodipine and hydralazine  Hyperlipidemia  Lipid lowering medication PTA:  Lipitor 20 mg daily  LDL 174, goal < 70  Now on Lipitor 80 mg daily  Continue statin at discharge  Diabetes  Glucoses 130-198  HgbA1c 11.4, goal < 7.0  Uncontrolled at home  Glucose stable after insulin adjustment  Lantus 15U bid   Basal NovoLog 3U Q4h  SSI  CBG monitoring  Low grade fever with leukocytosis, resolved  Tmax 101.6 -> afebrile  WBC now 10.4 (was elevated)  UA WBC 21-50  CXR unremarkable, improving from prior  Blood cultures negative 10/26   Respiratory cultures - mixed flora   Abdominal abscess culture showed proteus mirabilis  Off antibiotics - completed course  Hypernatremia -> hyponatremia-> hypernatremia  Na 143->145->143  On TF @ 65  Increase free water to 150cc Q4h  Continue BMP monitoring  Dysphagia s/p PEG complicated by abdominal wall abscess  Continue tube feeding @ 45 cc/h  PEG done 08/31/2018  Removed and replaced dislodged PEG 08/27/2018  CT showed Abdominal wall abscess - s/p open surgical drainage 08/14/2018 persistent foul odor discharge. Reexploration 10/04/2018  Culture showed peroteus mirabilis  Off meropenum  Wound debrided appropriately and sutures removed.  No evisceration  Trauma surgery now sign off  Still has weak cough on speech eval  Not candidate for decanulation yet  Diarrhea,  resolved  Likely related to TF  Dietitian has made formula change  Laxatives discontinued  Imodium as needed  Other Stroke Risk Factors  Obesity, Body mass index is 43.52 kg/m., recommend weight loss, diet and exercise as appropriate   Other Active Problems  Hypokalemia - resolved  Thrombocytosis - likely due to anemia, 483->534->496->508  Hospital day # [place], MSN, APRN, ANVP-BC, AGPCNP-BC Advanced Practice Stroke Nurse [place] for Schedule & Pager information 10/13/2018 3:01 PM  I have personally obtained history,examined this patient, reviewed notes, independently viewed imaging studies, participated in medical decision making and plan of care.ROS completed by me personally and pertinent positives fully documented  I have made any additions or clarifications directly to the above note. Agree with note above. Spoke to family at the bedside and answered questions. Patient medically stable to be transferred to skilled nursing facility later today if bed available  Antony Contras, MD Medical Director [place] Pager: [phone] 10/13/2018 3:14 PM  To contact Stroke Continuity provider, please refer  to [email or web address] After hours, contact General Neurology

## 2018-10-13 NOTE — Procedures (Signed)
Objective Swallowing Evaluation: Type of Study: FEES-Fiberoptic Endoscopic Evaluation of Swallow   Patient Details  Name: Andrea [name] MRN: [id number] Date of Birth: 09/24/1961  Today's Date: 10/13/2018 Time: SLP Start Time (ACUTE ONLY): 0930 -SLP Stop Time (ACUTE ONLY): 1015  SLP Time Calculation (min) (ACUTE ONLY): 45 min   Past Medical History:  Past Medical History:  Diagnosis Date  . Diabetes mellitus   . Environmental allergies   . Fibroid   . High cholesterol   . Hx of cardiovascular stress test    a. ETT-MV 2/14:  EF 46% (normal by echo), low risk, no ischemia or scar  . Hx of echocardiogram    Echo 2/14: mild LVH, EF 55-60%, Gr 1 diast dysfn, mild LAE  . Hypertension   . Hyperthyroidism    Radioactive iodine ablation   Past Surgical History:  Past Surgical History:  Procedure Laterality Date  . [place], 2001   x 2  . ESOPHAGOGASTRODUODENOSCOPY N/A 08/16/2018   Procedure: ESOPHAGOGASTRODUODENOSCOPY (EGD);  Surgeon: Judeth Horn, MD;  Location: [place];  Service: General;  Laterality: N/A;  bedside  . GASTROSTOMY N/A 08/16/2018   Procedure: INSERTION OF GASTROSTOMY TUBE;  Surgeon: Judeth Horn, MD;  Location: [place];  Service: General;  Laterality: N/A;  . IR ANGIO INTRA EXTRACRAN SEL COM CAROTID INNOMINATE BILAT MOD SED  08/13/2018  . IR ANGIO VERTEBRAL SEL SUBCLAVIAN INNOMINATE UNI R MOD SED  08/17/2018  . IR CT HEAD LTD  08/22/2018  . IR INTRA CRAN STENT  08/13/2018  . LAPAROTOMY N/A 08/31/2018   Procedure: EXPLORATORY LAPAROTOMY;  Surgeon: Judeth Horn, MD;  Location: [place];  Service: General;  Laterality: N/A;  . LAPAROTOMY N/A 10/01/2018   Procedure: EXPLORATORY LAPAROTOMY;  Surgeon: Judeth Horn, MD;  Location: [place];  Service: General;  Laterality: N/A;  . PEG PLACEMENT N/A 08/24/2018   Procedure: PERCUTANEOUS ENDOSCOPIC GASTROSTOMY (PEG) PLACEMENT;  Surgeon: Judeth Horn, MD;  Location: [place];  Service: General;  Laterality:  N/A;  . RADIOLOGY WITH ANESTHESIA N/A 08/31/2018   Procedure: IR WITH ANESTHESIA;  Surgeon: Luanne Bras, MD;  Location: [place];  Service: Radiology;  Laterality: N/A;   HPI: 57 yo presented to ED with chest pain noted Rt hemiparesis in ED with acute Left paramedian brainstem infarct. Intubated 10/10 for angioplasty, extubated post procedure and reintubated. Repeat MRI with brainstem infarct extension and bil cerebellar infarcts. Peg/trach 08/17/2018. FEES 09/15/18, 09/24/18. PMHx: HTN, DM, CKD   Subjective: alert, participatory and communicative    Assessment / Plan / Recommendation  CHL IP CLINICAL IMPRESSIONS 10/13/2018  Clinical Impression Pt continues to present with good integrity of larynx; no edema; adequate bilateral mobility of vocal folds. Ulceration noted on prior FEES (left upper third of inferior epiglottis) no longer present.  PMV in place for FEES.  Pt demonstrated improved oral control of POs with less residue on tongue; continues to piecemeal boluses into pharynx.  There was improved airway protection with nectar, honey-thick liquids and puree with high penetration of the liquids (Penetration-Aspiration Scale rating of 2), but material did not reach the vocal folds. Thin liquids were aspirated before the swallow; this elicited an immediate, strong cough response (indicating improved sensation since prior studies). Pt was partially reclined during assessment.  Recommend beginning a dysphagia 1 diet with nectar-thick liquids; pt may consume SINGLE SIPs using a straw.  Positioning should be optimized with HOB as high as possible, and pt should be fed slowly/carefully; PMV must be in place for  all PO intake.  Consider reducing PEG feedings as pt begins to normalize PO consumption.   SLP Visit Diagnosis Dysphagia, oropharyngeal phase (R13.12)  Attention and concentration deficit following --  Frontal lobe and executive function deficit following --  Impact on safety and function  Moderate aspiration risk      CHL IP TREATMENT RECOMMENDATION 10/13/2018  Treatment Recommendations Defer treatment plan to f/u with SLP     Prognosis 09/15/2018  Prognosis for Safe Diet Advancement Good  Barriers to Reach Goals --  Barriers/Prognosis Comment --    CHL IP DIET RECOMMENDATION 10/13/2018  SLP Diet Recommendations Dysphagia 1 (Puree) solids;Nectar thick liquid  Liquid Administration via Straw  Medication Administration Via alternative means  Compensations Minimize environmental distractions;Small sips/bites  Postural Changes Seated upright at 90 degrees      CHL IP OTHER RECOMMENDATIONS 10/13/2018  Recommended Consults --  Oral Care Recommendations Oral care BID  Other Recommendations --      CHL IP FOLLOW UP RECOMMENDATIONS 10/13/2018  Follow up Recommendations Skilled Nursing facility      [facility] IP FREQUENCY AND DURATION 09/24/2018  Speech Therapy Frequency (ACUTE ONLY) min 3x week  Treatment Duration 2 weeks           CHL IP ORAL PHASE 10/13/2018  Oral Phase Impaired  Oral - Pudding Teaspoon --  Oral - Pudding Cup --  Oral - Honey Teaspoon Piecemeal swallowing  Oral - Honey Cup --  Oral - Nectar Teaspoon Piecemeal swallowing  Oral - Nectar Cup --  Oral - Nectar Straw --  Oral - Thin Teaspoon --  Oral - Thin Cup --  Oral - Thin Straw --  Oral - Puree Piecemeal swallowing  Oral - Mech Soft --  Oral - Regular NT  Oral - Multi-Consistency --  Oral - Pill --  Oral Phase - Comment --    CHL IP PHARYNGEAL PHASE 10/13/2018  Pharyngeal Phase Impaired  Pharyngeal- Pudding Teaspoon --  Pharyngeal --  Pharyngeal- Pudding Cup --  Pharyngeal --  Pharyngeal- Honey Teaspoon --  Pharyngeal --  Pharyngeal- Honey Cup Delayed swallow initiation-vallecula  Pharyngeal Material enters airway, remains ABOVE vocal cords then ejected out  Pharyngeal- Nectar Teaspoon --  Pharyngeal --  Pharyngeal- Nectar Cup Delayed swallow initiation-pyriform  sinuses;Penetration/Aspiration before swallow  Pharyngeal Material enters airway, remains ABOVE vocal cords then ejected out  Pharyngeal- Nectar Straw Delayed swallow initiation-pyriform sinuses;Penetration/Aspiration before swallow  Pharyngeal Material enters airway, remains ABOVE vocal cords then ejected out  Pharyngeal- Thin Teaspoon Delayed swallow initiation-pyriform sinuses;Reduced airway/laryngeal closure;Penetration/Aspiration before swallow;Trace aspiration  Pharyngeal Material enters airway, passes BELOW cords and not ejected out despite cough attempt by patient  Pharyngeal- Thin Cup --  Pharyngeal --  Pharyngeal- Thin Straw --  Pharyngeal --  Pharyngeal- Puree Delayed swallow initiation-vallecula;Penetration/Aspiration before swallow  Pharyngeal Material enters airway, remains ABOVE vocal cords then ejected out  Pharyngeal- Mechanical Soft --  Pharyngeal --  Pharyngeal- Regular NT  Pharyngeal --  Pharyngeal- Multi-consistency --  Pharyngeal --  Pharyngeal- Pill --  Pharyngeal --  Pharyngeal Comment --     No flowsheet data found.   [name] 10/13/2018, 10:29 AM    Park Pope. Tivis Ringer, [place] Office number [phone] Pager [phone]

## 2018-10-13 NOTE — Progress Notes (Signed)
Patient's trach was changed to a #4 cuffless shiley (what patient had previously) without any complications & positive ETCO2 detector. [name] was secured with trach ties. Assisted by [facility], RRT.

## 2018-10-13 NOTE — Progress Notes (Signed)
Physical Therapy Treatment Patient Details Name: Andrea [name] MRN: [id number] DOB: [birth year] Today's Date: 10/13/2018    History of Present Illness 57 yo presented to ED with chest pain noted Rt hemiparesis in ED with acute Left paramedian brainstem infarct. Intubated 10/10 for angioplasty, extubated post procedure and reintubated. Repeat MRI with brainstem infarct extension and bil cerebellar infarcts. Peg/trach with return to vent 08/28/2018. 10/22-29 on trach collar, return to vent 10/29. 10/28 I & D of abdominal wall abscess with placement of penrose drain and G-tube, 11/1 repeat ex lap. 11/7 return to trach collar.  PMHx: HTN, DM, CKD    PT Comments    Focused on trunk control and WBing through bilat UEs at EOB. Pt tolerated sitting EOB x 10 min. Pt remains dependent for all mobility. Acute PT to cont to follow.   Follow Up Recommendations  SNF;Supervision/Assistance - 24 hour     Equipment Recommendations  Hospital bed;Wheelchair (measurements PT)    Recommendations for Other Services       Precautions / Restrictions Precautions Precautions: Fall Precaution Comments: Peg/trach, abdominal binder  Restrictions Weight Bearing Restrictions: No    Mobility  Bed Mobility Overal bed mobility: Needs Assistance Bed Mobility: Supine to Sit;Sit to Supine;Rolling Rolling: Total assist;+2 for physical assistance   Supine to sit: Total assist;+2 for physical assistance Sit to supine: Total assist;+2 for physical assistance   General bed mobility comments: pt soiled upon arrival, dependent for hygiene  Transfers                    Ambulation/Gait             General Gait Details: unable   Stairs             Wheelchair Mobility    Modified Rankin (Stroke Patients Only) Modified Rankin (Stroke Patients Only) Pre-Morbid Rankin Score: No symptoms Modified Rankin: Severe disability     Balance Overall balance assessment: Needs  assistance Sitting-balance support: Feet supported;Bilateral upper extremity supported Sitting balance-Leahy Scale: Poor Sitting balance - Comments: EOB with overall max assist to maintain balance, but demonstrating increased functional support of R UE in extension at times mod assist and noted trunkal activation and extension at EOB                                     Cognition Arousal/Alertness: Awake/alert Behavior During Therapy: WFL for tasks assessed/performed Overall Cognitive Status: Impaired/Different from baseline Area of Impairment: Following commands;Safety/judgement;Problem solving                     Memory: Decreased short-term memory Following Commands: Follows one step commands with increased time;Follows one step commands inconsistently Safety/Judgement: Decreased awareness of deficits   Problem Solving: Slow processing;Decreased initiation;Difficulty sequencing;Requires verbal cues;Requires tactile cues General Comments: pt very conversant today and initiating conversation      Exercises      General Comments General comments (skin integrity, edema, etc.): PMV on      Pertinent Vitals/Pain Pain Assessment: No/denies pain    Home Living                      Prior Function            PT Goals (current goals can now be found in the care plan section) Progress towards PT goals: Progressing toward goals    Frequency  Min 2X/week      PT Plan Current plan remains appropriate    Co-evaluation PT/OT/SLP Co-Evaluation/Treatment: Yes Reason for Co-Treatment: Complexity of the patient's impairments (multi-system involvement) PT goals addressed during session: Mobility/safety with mobility        AM-PAC PT "6 Clicks" Mobility   Outcome Measure  Help needed turning from your back to your side while in a flat bed without using bedrails?: Total Help needed moving from lying on your back to sitting on the side of a  flat bed without using bedrails?: Total Help needed moving to and from a bed to a chair (including a wheelchair)?: Total Help needed standing up from a chair using your arms (e.g., wheelchair or bedside chair)?: Total Help needed to walk in hospital room?: Total Help needed climbing 3-5 steps with a railing? : Total 6 Click Score: 6    End of Session Equipment Utilized During Treatment: Oxygen;Other (comment) Activity Tolerance: Patient tolerated treatment well Patient left: in bed;with call bell/phone within reach Nurse Communication: Mobility status;Need for lift equipment PT Visit Diagnosis: Other abnormalities of gait and mobility (R26.89);Muscle weakness (generalized) (M62.81);Other symptoms and signs involving the nervous system (R29.898);Hemiplegia and hemiparesis Hemiplegia - Right/Left: Right Hemiplegia - dominant/non-dominant: Dominant Hemiplegia - caused by: Cerebral infarction     Time: 0786-7544 PT Time Calculation (min) (ACUTE ONLY): 24 min  Charges:  $Therapeutic Activity: 8-22 mins                     Kittie Plater, PT, DPT Acute Rehabilitation Services Pager #: [phone] Office #: [phone]    Berline Lopes 10/13/2018, 2:29 PM

## 2018-10-13 NOTE — Progress Notes (Signed)
Occupational Therapy Treatment Patient Details Name: Andrea [name] MRN: [id number] DOB: May 24, 1961 Today's Date: 10/13/2018    History of present illness 57 yo presented to ED with chest pain noted Rt hemiparesis in ED with acute Left paramedian brainstem infarct. Intubated 10/10 for angioplasty, extubated post procedure and reintubated. Repeat MRI with brainstem infarct extension and bil cerebellar infarcts. Peg/trach with return to vent 08/29/2018. 10/22-29 on trach collar, return to vent 10/29. 10/28 I & D of abdominal wall abscess with placement of penrose drain and G-tube, 11/1 repeat ex lap. 11/7 return to trach collar.  PMHx: HTN, DM, CKD   OT comments  Patient willing to participate with OT.  Requires cueing for attention to task today, easily distracted but initiating conversation. Completed ROM exercises to B UE, continues to demonstrates activation with encouragement to L UE but requires max assist to use functionally with self care tasks (washing face and hands).  Total assist for hygiene (soiled upon entry) and rolling. Continues to require max assist for sitting EOB and total assist +2 for mobility.  Will continue to follow. Dc plan remains appropriate.     Follow Up Recommendations  SNF;Supervision/Assistance - 24 hour    Equipment Recommendations  Other (comment)(TBD at next venue of care )    Recommendations for Other Services      Precautions / Restrictions Precautions Precautions: Fall Precaution Comments: Peg/trach, abdominal binder  Restrictions Weight Bearing Restrictions: No       Mobility Bed Mobility Overal bed mobility: Needs Assistance Bed Mobility: Supine to Sit;Sit to Supine;Rolling Rolling: Total assist;+2 for physical assistance   Supine to sit: Total assist;+2 for physical assistance Sit to supine: Total assist;+2 for physical assistance   General bed mobility comments: total assist for rolling and transition to/from EOB   Transfers                      Balance Overall balance assessment: Needs assistance Sitting-balance support: Feet supported;Bilateral upper extremity supported Sitting balance-Leahy Scale: Poor Sitting balance - Comments: EOB with overall max assist to maintain balance, but demonstrating increased functional support of R UE in extension at times mod assist and noted trunkal activation and extension at EOB                                    ADL either performed or assessed with clinical judgement   ADL Overall ADL's : Needs assistance/impaired     Grooming: Maximal assistance;Wash/dry face;Wash/dry hands Grooming Details (indicate cue type and reason): hand over hand to wash face with R UE and hands while sitting EOB                     Toileting- Clothing Manipulation and Hygiene: Total assistance;+2 for physical assistance;Bed level Toileting - Clothing Manipulation Details (indicate cue type and reason): soiled upon arrival, total assist to complete hygiene       General ADL Comments: overall total assist with ADLs      Vision       Perception     Praxis      Cognition Arousal/Alertness: Awake/alert Behavior During Therapy: WFL for tasks assessed/performed Overall Cognitive Status: Impaired/Different from baseline Area of Impairment: Following commands;Safety/judgement;Problem solving;Attention                   Current Attention Level: Sustained Memory: Decreased short-term memory Following Commands: Follows one step  commands with increased time;Follows one step commands inconsistently Safety/Judgement: Decreased awareness of deficits   Problem Solving: Slow processing;Decreased initiation;Difficulty sequencing;Requires verbal cues;Requires tactile cues General Comments: pt pleasant and initating conversation        Exercises Exercises: Other exercises;General Upper Extremity General Exercises - Upper Extremity Shoulder Flexion:  AAROM;PROM;Supine;Both;5 reps(AAROM L, PROM R ) Elbow Flexion: AAROM;PROM;Both;Supine;5 reps(AAROM L, PROM R ) Elbow Extension: AAROM;PROM;Both;Supine;5 reps(AAROM L, PROM R ) Wrist Flexion: PROM;Both;Supine Wrist Extension: PROM;Both;Supine;5 reps Digit Composite Flexion: PROM;Both;Supine;5 reps Composite Extension: PROM;Both;Supine;5 reps Other Exercises Other Exercises: weightshifting and weightbearing through forearms, 3 reps each side    Shoulder Instructions       General Comments PMV on during session     Pertinent Vitals/ Pain       Pain Assessment: No/denies pain  Home Living                                          Prior Functioning/Environment              Frequency  Min 2X/week        Progress Toward Goals  OT Goals(current goals can now be found in the care plan section)  Progress towards OT goals: Progressing toward goals  Acute Rehab OT Goals Patient Stated Goal: to get stronger OT Goal Formulation: With patient/family Time For Goal Achievement: 10/20/18 Potential to Achieve Goals: [place] Discharge plan remains appropriate;Frequency remains appropriate    Co-evaluation      Reason for Co-Treatment: Complexity of the patient's impairments (multi-system involvement);For patient/therapist safety PT goals addressed during session: Mobility/safety with mobility OT goals addressed during session: ADL's and self-care;Other (comment);Strengthening/ROM(mobility)      AM-PAC OT "6 Clicks" Daily Activity     Outcome Measure   Help from another person eating meals?: Total Help from another person taking care of personal grooming?: A Lot Help from another person toileting, which includes using toliet, bedpan, or urinal?: Total Help from another person bathing (including washing, rinsing, drying)?: Total Help from another person to put on and taking off regular upper body clothing?: Total Help from another person to put on and  taking off regular lower body clothing?: Total 6 Click Score: 7    End of Session Equipment Utilized During Treatment: Oxygen(via trach collar)  OT Visit Diagnosis: Other abnormalities of gait and mobility (R26.89);Muscle weakness (generalized) (M62.81);Low vision, both eyes (H54.2);Feeding difficulties (R63.3);Other symptoms and signs involving cognitive function;Hemiplegia and hemiparesis Hemiplegia - Right/Left: Right Hemiplegia - dominant/non-dominant: Non-Dominant Hemiplegia - caused by: Nontraumatic intracerebral hemorrhage;Cerebral infarction   Activity Tolerance Patient tolerated treatment well   Patient Left in bed;with call bell/phone within reach;with family/visitor present   Nurse Communication Mobility status        Time: 2841-3244 OT Time Calculation (min): 27 min  Charges: OT General Charges $OT Visit: 1 Visit OT Treatments $Self Care/Home Management : 8-22 mins  Delight Stare, [place] Pager [phone] Office [phone]    Delight Stare 10/13/2018, 5:07 PM

## 2018-10-13 NOTE — Progress Notes (Signed)
NAME:  Andrea [name], MRN:  [id number], DOB:  1961/07/09, LOS: 71 ADMISSION DATE:  08/27/2018, CONSULTATION DATE:  08/08/2018 REFERRING MD:  Dr. Rory Percy, CHIEF COMPLAINT:  CP/ Acute CVA  Brief History   66 yoF w/poorly controlled HTN and IDDM presenting with CP and left arm pain w/BP 220/117. Initial CTH neg.  Cardiac workup negative for acute event thus far, cardiology following.  Code stroke initiated for R hemiplegia, dysarthria and right facial droop.  Out of window for TPA.  Requiring cleviprex for BP control.  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  Evolving pontine stroke on MRI. Intubated 10/10 for neuro IR s/p stenting and angioplasty with 80-90% patency.  Extubated post procedure but due to insufficency placed on BiPAP and brought to ICU.  Required reintubation and ultimately trach (10/21 JY).  Peg tube placed 10/21 by CCS.  Taken to OR 10/28 for open gastrostomy and I&D of abdominal wall abscess. 11/1 taken back to OR for repeat ex-lap.     Past Medical History  HTN, IDDM, hypothyroidism s/p ablation  Significant Hospital Events   10/9 present to [facility] ED -transferred to [facility] 10/9 basilar artery stenting 10/9 intubated urgently 6h post procedure for inability to protect airway. 10/10 CTA for declining exam - evolving pontine infarct, stent deemed to be patent 10/14 Brillinta stopped and ASA/ heparin started to in anticipation of tracheostomy on Monday. 10/14 through 10/19: Neuro exam has been stable sodium increasing in spite of free water via tube, providing supportive care awaiting trach 10/20: Sodium down to 151 from 156 clinically looks the same 10/21: Sodium down to 148, sputum culture sent for purulent tracheal secretions, awaiting trach and PEG.  Purulent bronchial secretions from right upper lobe sent for BAL 10/22: Tracheostomy and PEG completed on the 21st tolerated well.  Spiking low-grade fever white cells continued to climb starting empiric  vancomycin and ceftaz.  Sodium improved down to 146.  Blood pressure improving but still not at goal added hydrochlorothiazide.  Resumed Brilinta.  10/23: Sodium normal.  Backing down on free water.  Glycemic control challenging, increased basal NovoLog in addition to Lantus and sliding scale.  Still spiking fever, white blood cell count climbing, sending blood cultures.  Foley catheter being placed for pressure ulcer caused by pure wick device 10/27 Foul smelling dark trach secretions, PEG secretions  10/28 to OR for ex lap due to PEG dislodgement.  PEG replaced 10/29 remains on MV 2/2 tachypnea; family meeting 10/30 no weaning 2/2 to ongoing tachypnea; foul smelling drainage from abd site 10/31 no weaning, abx changed, family meeting 11/1 repeat Ex-lap for I&D of absces 11/1 tolerated > 48h trach collar trial 11/7 on ATC since 11/12 trach changed 11/13 not tolerating PMV 11/15 developed heavy vaginal bleeding. GYN called. Recommended transvag US.  11/16 started on IV estrogen per recs of GYN, also told to cont megace. Vaginal bleeding improving 11/17 copious Trach secretions. Neuro added HT saline nebs got 2 units PRBC for hgb of 7 11/19 seen by SLP. Has made progress. Repeat FEEs ordered  Consults: date of consult/date signed off & final recs:  Cards 10/10 Neurology 10/10 PCCM 10/10 CCS 10/17  Procedures (surgical and bedside):  10/10 Neuro IR >> s/p stent and angioplasty of distal basilar artery  10/10 ETT for procedure 10/10 ETT (reintubated) >> removed 10/21 10/10 Size 6 tracheostomy placed by Dr. Nelda Marseille 10/26 changed to #4 trach cuffless  10/28 Ex lap with replacement of gastrostomy tube due  to dislodgement 11/1 repeat Ex-lap with I&D of abdominal wall abscess  11/12 trach change 12/9 trach change  Significant Diagnostic Tests:  10/10  CTH >>  No acute intracranial abnormalities. Mild white matter changes likely due to small vessel ischemia 10/10 CTH >> No acute abnormality  and no change from earlier today 10/10 CTA head and neck >> Extensive atherosclerotic disease in the basilar with focal critical stenosis in the mid to distal basilar. No definite acute thrombus identified. Severe stenosis left posterior cerebral artery and moderate stenosis right posterior cerebral artery. Mild stenosis left MCA and moderate stenosis left MCA bifurcation. No significant carotid or vertebral artery stenosis in the neck. 10/10 MRI >> evolving pontine infarct. 10/11 ECHO >> normal LVSF with severe LVH. 10/28 CT A / P > dislodged PEG tube outside of the peritoneum and not within the stomach.  Cholelithiasis. No bowel obstruction.  No hydronephrosis.  10/30 CT abd/pelvis >> Mild bilateral atelectasis.  Interval placement of new gastrostomy tube with tip and balloon within gastric lumen. Surgical drain is seen in the soft tissues in previous gastrostomy site. Small amount of gas and stranding is seen in the peritoneal fat anterior and inferior to the stomach which most likely is related to gastrostomy placement. Probable surgical wound seen in right upper quadrant of the anterior abdominal wall.  8.2 x 2.3 cm gas and fluid collection is seen in the anterior abdominal wall which is unchanged compared to prior exam. Possible abscess cannot be excluded.  Enlarged fibroid uterus.  11/14 B LE venous dopplers  >>  Micro Data:  10/10 MRSA PCR >> negative 10/21 Sputum culture >> nl resp flora  10/23 Blood cultures x 2 >> negative 10/26 Blood cultures x 2 >> neg 10/28 MRSA PCR >> neg 10/27 Sputum culture >> normal flora 11/1 Abdominal abscess> kelbsiella, proteus, susceptible to imipenem, amp-sulbactam, ESBL  Antimicrobials:  10/10 cefazolin preop 10/22 Vancomycin >> 10/30 10/22 Ceftaz >> 10/31 10/31 ceftriaxone >> 11/4 10/31 flagyl >> 11/4 11/4 meropenem >> 11/9  Subjective:  Continues on trach collar.  Objective   Blood pressure 134/76, pulse 70, temperature 97.9 F (36.6 C),  resp. rate 16, height _0  (1.626 m), weight 115 kg, last menstrual period 07/26/2018, SpO2 100 %.    FiO2 (%):  [21 %] 21 %   Intake/Output Summary (Last 24 hours) at 10/13/2018 1329 Last data filed at 10/13/2018 1100 Gross per 24 hour  Intake 3630 ml  Output 1400 ml  Net 2230 ml   Filed Weights   09/18/18 0500 09/19/18 0500 10/08/18 0615  Weight: 76 kg 123 kg 115 kg   Examination:  Gen:      No acute distress HEENT:  EOMI, sclera anicteric Neck:     No masses; no thyromegaly Lungs:    Clear to auscultation bilaterally; normal respiratory effort CV:         Regular rate and rhythm; no murmurs Abd:      + bowel sounds; soft, non-tender; no palpable masses, no distension Ext:    No edema; adequate peripheral perfusion Skin:      Warm and dry; no rash Neuro: Awake, oriented, follows commands.    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  HTN Neuropathic pain  Uterine bleeding Acute blood loss anemia  Wound care s/p I&D of abd wall abscess DM hypothyroidism  Current Pulmonary specific problem list    Tracheostomy dependence d/t ineffective airway clearance & poor cough mechanics after CVA - s/p JY trach 10/10 - on ATC since 11/6  -She continues to have weak cough mechanics per speech eval 11/25, not  a candidate for decannulation, and may not be for some time if at all Plan Continue trach collar Would be hesitant to decannulate given her hx of poor cough mechanics and secretions / plugging Mobilize as able PRN bronchodilators Routine trach care She will need follow-up in trach clinic at time of discharge  Dysphasia following CVA Failed swallow 11/25>> weak  cough Plan Continue tube feeds SLP following  Oral Thrush Plan Continue  Nystatin mouthwash Frequent mouthcare  Marshell Garfinkel MD  Pulmonary and Critical Care 10/13/2018, 1:31 PM

## 2018-10-14 LAB — GLUCOSE, CAPILLARY
GLUCOSE-CAPILLARY: 124 mg/dL — AB (ref 70–99)
GLUCOSE-CAPILLARY: 138 mg/dL — AB (ref 70–99)
Glucose-Capillary: 115 mg/dL — ABNORMAL HIGH (ref 70–99)
Glucose-Capillary: 117 mg/dL — ABNORMAL HIGH (ref 70–99)
Glucose-Capillary: 124 mg/dL — ABNORMAL HIGH (ref 70–99)
Glucose-Capillary: 125 mg/dL — ABNORMAL HIGH (ref 70–99)
Glucose-Capillary: 133 mg/dL — ABNORMAL HIGH (ref 70–99)
Glucose-Capillary: 141 mg/dL — ABNORMAL HIGH (ref 70–99)
Glucose-Capillary: 150 mg/dL — ABNORMAL HIGH (ref 70–99)
Glucose-Capillary: 137 mg/dL — ABNORMAL HIGH (ref 70–99)
Glucose-Capillary: 97 mg/dL (ref 70–99)
Glucose-Capillary: 131 mg/dL — ABNORMAL HIGH (ref 70–99)
Glucose-Capillary: 132 mg/dL — ABNORMAL HIGH (ref 70–99)

## 2018-10-14 MED ORDER — AMLODIPINE BESYLATE 10 MG PO TABS: 10.0000 mg | ORAL_TABLET | Freq: Every day | ORAL | Status: AC

## 2018-10-14 MED ORDER — HYDRALAZINE HCL 25 MG PO TABS
25.0000 mg | ORAL_TABLET | Freq: Three times a day (TID) | ORAL | Status: DC
  Administered 2018-10-14 – 2018-11-01 (×54): 25 mg
  Filled 2018-10-14 (×54): qty 1

## 2018-10-14 MED ORDER — HYDRALAZINE HCL 25 MG PO TABS: 25.0000 mg | ORAL_TABLET | Freq: Three times a day (TID) | ORAL | Status: AC

## 2018-10-14 MED ORDER — VITAL AF 1.2 CAL PO LIQD: 1500.0000 mL | ORAL | Status: AC

## 2018-10-14 MED ORDER — LORAZEPAM 0.5 MG PO TABS: 0.2500 mg | ORAL_TABLET | Freq: Every day | ORAL | 0 refills | Status: AC

## 2018-10-14 MED ORDER — INSULIN GLARGINE 100 UNIT/ML ~~LOC~~ SOLN: 15.0000 [IU] | Freq: Two times a day (BID) | SUBCUTANEOUS | 11 refills | Status: AC

## 2018-10-14 MED ORDER — FREE WATER: 150.0000 mL | Status: AC

## 2018-10-14 MED ORDER — GUAIFENESIN 100 MG/5ML PO SOLN: 5.0000 mL | ORAL | 0 refills | Status: AC | PRN

## 2018-10-14 MED ORDER — ACETAMINOPHEN 160 MG/5ML PO SOLN: 650.0000 mg | ORAL | 0 refills | Status: AC | PRN

## 2018-10-14 MED ORDER — ESCITALOPRAM OXALATE 10 MG PO TABS: 10.0000 mg | ORAL_TABLET | Freq: Every day | ORAL | Status: AC

## 2018-10-14 MED ORDER — BARRIER CREAM NON-SPECIFIED: 1.0000 "application " | TOPICAL_CREAM | Freq: Two times a day (BID) | TOPICAL | Status: AC | PRN

## 2018-10-14 MED ORDER — INSULIN ASPART 100 UNIT/ML ~~LOC~~ SOLN: 3.0000 [IU] | SUBCUTANEOUS | 11 refills | Status: AC

## 2018-10-14 MED ORDER — FERROUS SULFATE 300 (60 FE) MG/5ML PO SYRP
300.0000 mg | ORAL_SOLUTION | Freq: Three times a day (TID) | ORAL | Status: DC
  Administered 2018-10-14 – 2018-11-13 (×85): 300 mg
  Filled 2018-10-14 (×93): qty 5

## 2018-10-14 MED ORDER — THIAMINE HCL 100 MG PO TABS: 100.0000 mg | ORAL_TABLET | Freq: Every day | ORAL | Status: AC

## 2018-10-14 MED ORDER — BACLOFEN 10 MG PO TABS: 10.0000 mg | ORAL_TABLET | Freq: Three times a day (TID) | ORAL | 0 refills | Status: AC

## 2018-10-14 MED ORDER — BACLOFEN 10 MG PO TABS
10.0000 mg | ORAL_TABLET | Freq: Three times a day (TID) | ORAL | Status: DC
  Administered 2018-10-14 – 2018-10-27 (×39): 10 mg
  Filled 2018-10-14 (×40): qty 1

## 2018-10-14 MED ORDER — TICAGRELOR 90 MG PO TABS: 90.0000 mg | ORAL_TABLET | Freq: Two times a day (BID) | ORAL | Status: AC

## 2018-10-14 MED ORDER — MEGESTROL ACETATE 40 MG PO TABS: 80.0000 mg | ORAL_TABLET | Freq: Two times a day (BID) | ORAL | Status: AC

## 2018-10-14 MED ORDER — NYSTATIN 100000 UNIT/ML MT SUSP: 5.0000 mL | Freq: Four times a day (QID) | OROMUCOSAL | 0 refills | Status: DC

## 2018-10-14 MED ORDER — ADULT MULTIVITAMIN LIQUID CH: 15.0000 mL | Freq: Every day | ORAL | Status: AC

## 2018-10-14 MED ORDER — GABAPENTIN 250 MG/5ML PO SOLN: 100.0000 mg | Freq: Two times a day (BID) | ORAL | 12 refills | Status: AC

## 2018-10-14 MED ORDER — AMLODIPINE BESYLATE 10 MG PO TABS
10.0000 mg | ORAL_TABLET | Freq: Every day | ORAL | Status: DC
  Administered 2018-10-15 – 2018-11-13 (×30): 10 mg
  Filled 2018-10-14 (×27): qty 1
  Filled 2018-10-14: qty 2
  Filled 2018-10-14 (×2): qty 1

## 2018-10-14 MED ORDER — LOPERAMIDE HCL 2 MG PO CAPS: 4.0000 mg | ORAL_CAPSULE | ORAL | Status: DC | PRN

## 2018-10-14 MED ORDER — ASPIRIN 81 MG PO CHEW: 81.0000 mg | CHEWABLE_TABLET | Freq: Every day | ORAL | Status: AC

## 2018-10-14 MED ORDER — PANTOPRAZOLE SODIUM 40 MG PO PACK: 40.0000 mg | PACK | Freq: Every day | ORAL | Status: AC

## 2018-10-14 MED ORDER — HYDROCODONE-ACETAMINOPHEN 7.5-325 MG/15ML PO SOLN: 15.0000 mL | ORAL | 0 refills | Status: AC | PRN

## 2018-10-14 MED ORDER — MEGESTROL ACETATE 40 MG PO TABS
80.0000 mg | ORAL_TABLET | Freq: Two times a day (BID) | ORAL | Status: DC
  Administered 2018-10-14 – 2018-11-13 (×60): 80 mg
  Filled 2018-10-14 (×62): qty 2

## 2018-10-14 MED ORDER — FERROUS SULFATE 300 (60 FE) MG/5ML PO SYRP: 300.0000 mg | ORAL_SOLUTION | Freq: Three times a day (TID) | ORAL | 3 refills | Status: AC

## 2018-10-14 MED ORDER — GUAIFENESIN 100 MG/5ML PO SOLN: 5.0000 mL | ORAL | Status: DC | PRN

## 2018-10-14 MED ORDER — HYDROCODONE-ACETAMINOPHEN 7.5-325 MG/15ML PO SOLN
15.0000 mL | ORAL | Status: DC | PRN
  Administered 2018-10-14 – 2018-10-15 (×2): 15 mL
  Filled 2018-10-14 (×4): qty 15

## 2018-10-14 MED ORDER — CARVEDILOL 25 MG PO TABS: 25.0000 mg | ORAL_TABLET | Freq: Two times a day (BID) | ORAL | Status: AC

## 2018-10-14 MED ORDER — ZINC OXIDE 40 % EX OINT: TOPICAL_OINTMENT | Freq: Four times a day (QID) | CUTANEOUS | 0 refills | Status: AC

## 2018-10-14 MED ORDER — ATORVASTATIN CALCIUM 80 MG PO TABS: 80.0000 mg | ORAL_TABLET | Freq: Every day | ORAL | Status: AC

## 2018-10-14 NOTE — Progress Notes (Signed)
  Speech Language Pathology Treatment: Dysphagia;Passy Muir Speaking valve  Patient Details Name: Andrea [name] MRN: [id number] DOB: Mar 05, 1961 Today's Date: 10/14/2018 Time: 2025-4270 SLP Time Calculation (min) (ACUTE ONLY): 20 min  Assessment / Plan / Recommendation Clinical Impression  Pt awaiting D/C to SNF.  Assisted with positioning for oral intake; reviewed results of yesterday's FEES and new precautions.  Discussed importance of pt advocating for herself and her safety by understanding and communicating these swallowing precautions to staff at facility.  We reviewed multiple times until pt could recite them back to clinician.  Pt tolerated POs very well; PMV in place; she continues to require verbal cues for deep inhalation to improve volume and intelligibility.    Now that pt is on a PO diet, she would benefit from having TF adjusted - have placed call RD services so that accommodation can be made prior to D/C.   HPI HPI: 57 yo presented to ED with chest pain noted Rt hemiparesis in ED with acute Left paramedian brainstem infarct. Intubated 10/10 for angioplasty, extubated post procedure and reintubated. Repeat MRI with brainstem infarct extension and bil cerebellar infarcts. Peg/trach 08/28/2018. FEES 09/15/18, 09/24/18. PMHx: HTN, DM, CKD      SLP Plan  Continue with current plan of care       Recommendations  Diet recommendations: Dysphagia 1 (puree);Nectar-thick liquid Liquids provided via: Straw(one sip at a time) Medication Administration: Crushed with puree Supervision: Full supervision/cueing for compensatory strategies Compensations: Minimize environmental distractions;Small sips/bites Postural Changes and/or Swallow Maneuvers: Seated upright 90 degrees;Upright 30-60 min after meal      Patient may use Passy-Muir Speech Valve: During all therapies with supervision;Intermittently with supervision PMSV Supervision: Full         Oral Care Recommendations: Oral  care BID Follow up Recommendations: Skilled Nursing facility SLP Visit Diagnosis: Dysphagia, oropharyngeal phase (R13.12) Plan: Continue with current plan of care       GO                [name] 10/14/2018, 10:46 AM Estill Bamberg L. Tivis Ringer, [place] Office number [phone] Pager [phone]

## 2018-10-14 NOTE — Progress Notes (Addendum)
STROKE TEAM PROGRESS NOTE   SUBJECTIVE (INTERVAL HISTORY) Lying in bed. Dr discussed d/c plan w. Pt.stable without complaints, other that not being discharged.  OBJECTIVE Vitals:   10/14/18 0339 10/14/18 0400 10/14/18 0704 10/14/18 0846  BP: (!) 149/68 (!) 159/87 (!) 159/85 (!) 152/80  Pulse: 67 (!) 57 69 84  Resp: 14 15  19   Temp: 98.3 F (36.8 C)  98.4 F (36.9 C)   TempSrc: Oral  Oral   SpO2: 98% 98% 99% 97%  Weight:      Height:        CBC:  Recent Labs  Lab 10/10/18 1155 10/13/18 1126  WBC 12.6* 10.4  HGB 9.8* 9.8*  HCT 33.5* 34.0*  MCV 86.8 87.4  PLT 496* 508*    Basic Metabolic Panel:  Recent Labs  Lab 10/10/18 1155 10/13/18 1126  NA 148* 143  K 4.2 3.9  CL 115* 112*  CO2 22 21*  GLUCOSE 128* 181*  BUN 33* 30*  CREATININE 0.74 0.72  CALCIUM 11.7* 11.3*    IMAGING No results found.    PHYSICAL EXAM per Dr. Leonie Man General -  Obese middle-aged African-American lady, on trach collar, not in distress. She has abdominal surgical wound and drainage from the abdominal wall, dressing clean Cardiovascular - regular rate and rhythm Neuro - on trach collar, eyes open, awake, alert. No ptosis or EOMI, denies diplopia.  PERRL. Bilateral facial weakness, tongue protrusion weak. Able to wiggle left hand fingers and left foot toes. 2-/5 LLE and 2/5 LUE. Follows commands and good eye contact.  Speech is moderate dysarthric with speaking valve.   Dense right hemiplegia persists.   ASSESSMENT/PLAN Andrea [name] is a 57 y.o. female with history of difficult to control hypertension, insulin-dependent diabetes, obesity, hypothyroidism status post ablation presenting with chest discomfort, right-sided weakness, slurred speech and right facial droop. She did not receive IV t-PA due to late presentation. S/P stent assisted angioplasty of distal basilar artery.  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 Worsening symptoms  with extension of pontine/medullary infarcts with new small bilateral cerebellar infarcts without evidence of stent re-stenosis or occlusion  CT head - No acute intracranial abnormalities.  CTA H&N 08/26/2018 - Extensive atherosclerotic disease in the basilar with focal critical stenosis in the mid to distal basilar. No definite acute thrombus identified. Severe stenosis left posterior cerebral artery  MRI head - Acute nonhemorrhagic infarct involving the left paramedian brainstem.   CTA H&N 08/15/2018 - stent too dense to see BA lumen, but distal flow preserved. New small cerebellar infarcts since brain MRI yesterday.  Severe left P2 segment stenosis, also seen previously.   Repeat MRI - extension of b/l pontine and upper medullary infarcts with new b/l cerebellar infarcts.  2D Echo - EF 60-65%  LDL - 174  HgbA1c - 11.1  VTE prophylaxis - heparin subq  aspirin 81 mg daily prior to admission, now resumed on Brilinta 90 mg bid and ASA 81 mg daily.   Therapy recommendations:  SNF  Disposition:  palliative care service has discussed with pt using speaking valve and clearly pt would like full aggressive care.   SW still working on the placement,  D/c paperwork tentative. Medically ready for d.c  Abnormal uterine bleeding, improved  Has been following with GYN  Was on megace in the past  CT abd/pelvis 09/03/18 - enlarged fibroid uterus  continue megace 80mg  bid  Continue ASA and brilinta for now  OBGYN recs appreciated  Received prmarin 25mg  IV once - vaginal bleeding much improved  Anemia, improving  Hb 6.8->PRBC->7.1--->9.8  Likely due to iron deficiency, heavy periods and acute uterine bleeding and abdominal wall surgeries  Iron panel showed iron deficiency  On iron solutions  PRBC transfusion 1 U 09/15/2018 and 2U 09/07/18 and 2U 09/21/18  Intracranial stenosis  BA mid to distal severe stenosis s/p BA stenting  Left PCA severe stenosis, R PCA moderate  stenosis  Left MCA moderate stenosis  Uncontrolled stroke risk factors with HLD, HTN, DM  Non compliance with meds at home  Respiratory failure  S/p trach 08/14/2018  On trach collar now  Able to speak with [name] valve although dysarthric  Trach secretion improving   Not a candidate for decannulation at this time due to weak cough  Hypertension  Stable so far  BP goal 130-140   On Coreg 25 bid, amlodipine and hydralazine  Hyperlipidemia  Lipid lowering medication PTA:  Lipitor 20 mg daily  LDL 174, goal < 70  Now on Lipitor 80 mg daily  Continue statin at discharge  Diabetes  Glucoses 115-171  HgbA1c 11.4, goal < 7.0  Uncontrolled at home  insulin adjustment as Inpatient  Lantus 15U bid   Basal NovoLog 3U Q4h  SSI  CBG monitoring  Low grade fever with leukocytosis, resolved  Tmax 101.6 -> afebrile  WBC now 10.4 (was elevated)  UA WBC 21-50  CXR unremarkable, improving from prior  Blood cultures negative 10/26   Respiratory cultures - mixed flora   Abdominal abscess culture showed proteus mirabilis  Off antibiotics - completed course  Hypernatremia -> hyponatremia-> hypernatremia  Na 143->145->143  On TF @ 65  On free water 150cc Q4h  Dysphagia s/p PEG complicated by abdominal wall abscess  Continue tube feeding @ 45 cc/h  PEG done 09/03/2018  Removed and replaced dislodged PEG 09/04/2018  CT showed Abdominal wall abscess - s/p open surgical drainage 08/16/2018 persistent foul odor discharge. Reexploration 10/03/2018  Culture showed peroteus mirabilis  Off meropenum  Wound debrided appropriately and sutures removed.  No evisceration  Trauma surgery now sign off  Still has weak cough on speech eval  Not candidate for decanulation yet  Diarrhea, resolved  Likely related to TF  Dietitian has made formula change  Laxatives discontinued  Imodium as needed  Other Stroke Risk Factors  Obesity, Body mass index is  43.52 kg/m., recommend weight loss, diet and exercise as appropriate   Other Active Problems  Hypokalemia - resolved  Thrombocytosis - likely due to anemia, 483->534->496->508  No change in patient condition. Medically ready for d.c, await bed. Await confirmation call from [facility] for them to admit pt today but CSW received a voice mail on work phone from [place] at 9:12pm on 12/09 stating that "I am just calling because I don't think we want her going to [facility] because the family doesn't want her going there. We want a second opinion." They are requesting placement in [place]. There is currently no additional offers from any facilities  Hospital day # [place], MSN, APRN, ANVP-BC, AGPCNP-BC Advanced Practice Stroke Nurse [place] for Schedule & Pager information 10/14/2018 9:29 AM   To contact Stroke Continuity provider, please refer to [email or web address] After hours, contact General Neurology

## 2018-10-14 NOTE — Social Work (Addendum)
CSW received a voice mail on work phone from [place] at 9:12pm on 12/09 stating that "I am just calling because I don't think we want her going to [facility] because the family doesn't want her going there. We want a second opinion." They are requesting placement in [place]. There is currently no additional offers from any facilities.   1:42pm- Await confirmation call from [facility] for them to admit pt today. When we get confirmation call that facility can accept pt today CSW and RN Case Manager will call [name] and let her know her options. [facility] or 2. To take pt home. Paged Burnetta Sabin to let her know.  1:45pm- Ivin Booty now off shift, paged D. Tamala Julian, neuro PA.  2:59pm- Spoke with [place] and Clarene Critchley at [employer]. They are getting in touch with financial counseling assigned worker Hoy Finlay [phone]. Once they have confirmed details of pt's Medicaid they will let me know.   Westley Hummer, MSW, [place] Work [phone]

## 2018-10-15 LAB — BASIC METABOLIC PANEL
ANION GAP: 9 (ref 5–15)
BUN: 25 mg/dL — AB (ref 6–20)
CO2: 22 mmol/L (ref 22–32)
Calcium: 11 mg/dL — ABNORMAL HIGH (ref 8.9–10.3)
Chloride: 108 mmol/L (ref 98–111)
Creatinine, Ser: 0.57 mg/dL (ref 0.44–1.00)
GFR calc Af Amer: >60 mL/min (ref >60)
GFR calc non Af Amer: >60 mL/min (ref >60)
Glucose, Bld: 173 mg/dL — ABNORMAL HIGH (ref 70–99)
Potassium: 4 mmol/L (ref 3.5–5.1)
Sodium: 139 mmol/L (ref 135–145)

## 2018-10-15 LAB — CBC
HCT: 31.4 % — ABNORMAL LOW (ref 36.0–46.0)
Hemoglobin: 9.5 g/dL — ABNORMAL LOW (ref 12.0–15.0)
MCH: 25.9 pg — ABNORMAL LOW (ref 26.0–34.0)
MCHC: 30.3 g/dL (ref 30.0–36.0)
MCV: 85.6 fL (ref 80.0–100.0)
Platelets: 466 10*3/uL — ABNORMAL HIGH (ref 150–400)
RBC: 3.67 MIL/uL — AB (ref 3.87–5.11)
RDW: 17.5 % — ABNORMAL HIGH (ref 11.5–15.5)
WBC: 10.1 10*3/uL (ref 4.0–10.5)
nRBC: 0 % (ref 0.0–0.2)

## 2018-10-15 LAB — GLUCOSE, CAPILLARY
GLUCOSE-CAPILLARY: 128 mg/dL — AB (ref 70–99)
Glucose-Capillary: 128 mg/dL — ABNORMAL HIGH (ref 70–99)
Glucose-Capillary: 124 mg/dL — ABNORMAL HIGH (ref 70–99)
Glucose-Capillary: 143 mg/dL — ABNORMAL HIGH (ref 70–99)
Glucose-Capillary: 120 mg/dL — ABNORMAL HIGH (ref 70–99)
Glucose-Capillary: 89 mg/dL (ref 70–99)

## 2018-10-15 LAB — MRSA PCR SCREENING: MRSA by PCR: NEGATIVE

## 2018-10-15 MED ORDER — VITAL AF 1.2 CAL PO LIQD
237.0000 mL | Freq: Four times a day (QID) | ORAL | Status: DC
  Administered 2018-10-15 – 2018-11-06 (×68): 237 mL
  Filled 2018-10-15 (×3): qty 237
  Filled 2018-10-15: qty 1000
  Filled 2018-10-15 (×12): qty 237
  Filled 2018-10-15: qty 1000
  Filled 2018-10-15 (×12): qty 237
  Filled 2018-10-15 (×2): qty 1000
  Filled 2018-10-15 (×2): qty 237
  Filled 2018-10-15: qty 1000
  Filled 2018-10-15 (×27): qty 237
  Filled 2018-10-15 (×2): qty 1000
  Filled 2018-10-15 (×4): qty 237
  Filled 2018-10-15: qty 1000
  Filled 2018-10-15 (×3): qty 237
  Filled 2018-10-15 (×2): qty 1000
  Filled 2018-10-15 (×15): qty 237
  Filled 2018-10-15: qty 1000
  Filled 2018-10-15 (×3): qty 237
  Filled 2018-10-15: qty 1000
  Filled 2018-10-15 (×7): qty 237

## 2018-10-15 MED ORDER — ENSURE ENLIVE PO LIQD
237.0000 mL | Freq: Two times a day (BID) | ORAL | Status: DC
  Administered 2018-10-15 – 2018-11-04 (×29): 237 mL via ORAL
  Filled 2018-10-15: qty 237

## 2018-10-15 MED ORDER — PRO-STAT SUGAR FREE PO LIQD
30.0000 mL | Freq: Three times a day (TID) | ORAL | Status: DC
  Administered 2018-10-15 – 2018-11-13 (×88): 30 mL
  Filled 2018-10-15 (×88): qty 30

## 2018-10-15 NOTE — Progress Notes (Signed)
CSW met with patients family, [name], Kenney Houseman, and [place] for family meeting- RN CM, charge RN, social work Surveyor, quantity, and Dr. Erlinda Hong were all present.   CSW informed family of bed offer at [facility] which is currently only bed offer for patient. Family members voiced concerns with patient being further away than expected and potentially not being able to visit as often as they have been. Family voiced feeling like this may set patient back in her progress. However, family are agreeable to discharge to [facility] at this time once all needed information can be collected. Family members were tearful during conversation and provided happy memories of patient. Family voiced feeling that this is the best option for patient at this time and look forward to when she will be able to return home.   MD informed family of patient being medically stable to discharge and patient will be able to discharge to facility once facility is prepared for patient.   Plan: Patient to discharge to [facility] at [facility] once facility has all needed information.   Kingsley Spittle, [place]  [phone]

## 2018-10-15 NOTE — Progress Notes (Addendum)
Nutrition Follow-up  DOCUMENTATION CODES:   Morbid obesity  INTERVENTION:  Let pt attempt to eat at meals, if po intake at a meal is <50% then provide a bolus tube feed via PEG using Vital AF 1.2 formula at volume of 237 ml up to TID.   Additionally provide a bolus feed HS daily.   Provide 30 ml Prostat (or equivalent) TID per tube.   Continue free water flushes of 150 ml q 4 hours between bolus feeds. (MD to adjust as appropriate)  If all four bolus tube feeds are given, regimen to provide 1438 kcal (90% of kcal needs), 116 grams of protein (97% of protein needs), and 1668 ml water.   Provide Ensure Enlive po BID (thickened to nectar thick liquids), each supplement provides 350 kcal and 20 grams of protein  Encourage PO intake.   NUTRITION DIAGNOSIS:   Inadequate oral intake related to inability to eat as evidenced by NPO status; diet advanced; progressing  GOAL:   Patient will meet greater than or equal to 90% of their needs; met with TF  MONITOR:   PO intake, Supplement acceptance, Labs, Skin, I & O's, Weight trends, TF tolerance  REASON FOR ASSESSMENT:   Consult Enteral/tube feeding initiation and management  ASSESSMENT:   [name] is a 57 yo female with PMH of type 2 diabetes, HTN, CKD III, obesity, and anemia admitted for chest pain. Code Stroke called 10/10 in AM. 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 Intubated 10/10 in ICU.  10/21 trach and PEG.11/1 ex lap, I&D.  Diet has been advanced to a dysphagia 1 diet with nectar thick liquids. Meal completion has been varied from 5-50%. Noted pt with thrush and reports tongue sensitivity. RD spoke with SLP regarding needs for modification of tube feeding prior to discharge to SNF. RD to change feedings to provide a bolud tube feed if po intake at a meal is <50%. New regimen will ensure adequate nutrition will be maintained and will encourage PO intake at meals as pt will no longer be on continuous feeds. Labs and  medications reviewed.   Diet Order:   Diet Order            DIET - DYS 1 Room service appropriate? Yes; Fluid consistency: Nectar Thick  Diet effective now              EDUCATION NEEDS:   Not appropriate for education at this time  Skin:  Skin Assessment: Skin Integrity Issues: Skin Integrity Issues:: Incisions Stage II: healed Incisions: abdomen  Last BM:  12/10  Height:   Ht Readings from Last 1 Encounters:  09/03/18 5' 4" (1.626 m)    Weight:   Wt Readings from Last 1 Encounters:  10/08/18 115 kg    Ideal Body Weight:  54.5 kg  BMI:  Body mass index is 43.52 kg/m.  Estimated Nutritional Needs:   Kcal:  1600-1800  Protein:  120-135 grams  Fluid:  > 1.6 L    Andrea Parker, MS, RD, LDN Pager # [phone] After hours/ weekend pager # [phone]

## 2018-10-15 NOTE — Progress Notes (Signed)
Spoke with pt's sister [name], with CSW via speaker phone.  We reviewed events of the family meeting held on Friday, 10/10/18 at 10:00, in which family Dr. Erlinda Hong stated pt was medically stable for discharge, and family agreed to placement at [facility].  [name] stated that over the weekend, she had spoken with additional family members about the decision, and they were not pleased with the facility ratings and distance from [place].   [name] was again made aware that [facility] facility is our only offer, and we have reached out to multiple facilities in the surrounding counties without success.  She was also made aware that if family continues to refuse bed offer, the hospital may attempt to pursue guardianship over the patient.  She verbalizes understanding of this, but was unhappy to hear that it could come to that.   [name] did agree to speak with the rest of her family, and let them know that we would continue discussions about moving patient to [facility] facility once facility is ready for her.  She is hopeful that she would dc to SNF in [place] short term, and once her Medicaid is approved, she could be moved to a closer facility, perhaps in [place].   Will continue to follow and offer support.  Updated attending physician, per his request, by phone.    Reinaldo Raddle, RN, BSN  Trauma/Neuro ICU Case Manager [phone]

## 2018-10-15 NOTE — Social Work (Signed)
11:51am- Spoke with Medicaid worker who requested FL2 be sent for hospital to initiate LTC Medicaid to [phone]  11:48am- CSW received call and voicemail from [place] while in morning rounds.  CSW returned call with the support of RN Case Manager.   We reviewed with [name] the conversation that was had on Friday with pt family, MD, RN CM, CSW Surveyor, quantity and [place].  We reviewed that pt was medically stable for discharge (as evidenced by Dr. Erlinda Hong and Dr. Clydene Fake documentation and discharge) and that pt family had agreed to placement at [facility] during the meeting. Frita expressed that she had spoken with two other siblings over the weekend  (these siblings were not present at family meeting on 12/6) and that they were unhappy with distance and CMS rating of facility. CM and CSW discussed that the hospital would be using an LOG to cover expenses for pt while pt's Medicaid is pending.  When pt is approved for Medicaid we discussed again that pt family could collaborate with a facility closer to the [place] area for [employer] states understanding.  CM and CSW also discussed that if we have an offer, and pt continues to be medically stable for discharge, and pt family continues to refuse an appropriate offer that in some cases the hospital has to pursue guardianship.  Frita expressed displeasure at this; and shared that she feels pt hospital stay has been complicated and stressful and that we have not been transparent as a care team. CSW and CM acknowledged the stress of prolonged hospitalization that family must be feeling.  We discussed that currently for Clarabelle getting to rehab, where she can get therapies and care, is the best plan for her; we acknowledge that we cannot create a perfect outcome for Ruchama and her family but that we are trying to get things in order for her- placement and Medicaid so that Jhoselin can move closer to family in the future or even get stronger  physically and closer to her goal of returning home with family care. We discussed that Everleigh cannot stay here at hospital once these plans are in place.  We encouraged [name] to speak with her siblings and let them know that once we confirm with [place] that pt has the needed Medicaid items in place that we would have to again discuss moving her.  Frita aware that she would have to complete paperwork for Wylie at the facility due to her physical limitations.   CSW and CM remain available for support and clarification as needed. MD paged and spoken to, he is aware of conversation had today and family meeting on Friday.     10:18am- CSW reached out to [facility] worker; she states that she is working on [employer], that [place] is requesting LTC Medicaid be submitted and that pt be pending with that.  Westley Hummer, MSW, [place] Work [phone]

## 2018-10-15 NOTE — Progress Notes (Addendum)
STROKE TEAM PROGRESS NOTE   SUBJECTIVE (INTERVAL HISTORY) Pt in bed. Andrea [name] her sister is at bedside. Upset pt ready for discharge. Yelling at MD. MD stated he would discuss further with SW. He is available to meet with SW/family as needed. Pt medically stable for d/c when bed available. Andrea [name] left after 30 minutes of meeting with me and did not wait for meeting with social worker  OBJECTIVE Vitals:   10/15/18 0400 10/15/18 0752 10/15/18 0842 10/15/18 1141  BP: 128/79 (!) 156/83 138/73 138/73  Pulse: 67 79 76 77  Resp: 13 (!) 26 (!) 22 (!) 28  Temp: 98.7 F (37.1 C) 98.2 F (36.8 C)  98.3 F (36.8 C)  TempSrc: Oral Oral  Oral  SpO2: 97% 99% 97% 99%  Weight:      Height:        CBC:  Recent Labs  Lab 10/13/18 1126 10/15/18 1110  WBC 10.4 10.1  HGB 9.8* 9.5*  HCT 34.0* 31.4*  MCV 87.4 85.6  PLT 508* 466*    Basic Metabolic Panel:  Recent Labs  Lab 10/13/18 1126 10/15/18 1110  NA 143 139  K 3.9 4.0  CL 112* 108  CO2 21* 22  GLUCOSE 181* 173*  BUN 30* 25*  CREATININE 0.72 0.57  CALCIUM 11.3* 11.0*    IMAGING No results found.    PHYSICAL EXAM per Dr. Leonie Man General -  Obese middle-aged African-American lady, on trach collar, not in distress. She has abdominal surgical wound and drainage from the abdominal wall, dressing clean Cardiovascular - regular rate and rhythm Neurological Exam ;  - on trach collar, eyes open, awake, alert. No ptosis or EOMI, denies diplopia.  PERRL. Bilateral facial weakness, tongue protrusion weak. Able to wiggle left hand fingers and left foot toes. 2-/5 LLE and 2/5 LUE. Follows commands and good eye contact.  Speech is moderate dysarthric with speaking valve.   Dense right hemiplegia persists.   ASSESSMENT/PLAN Andrea [name] is a 57 y.o. female with history of difficult to control hypertension, insulin-dependent diabetes, obesity, hypothyroidism status post ablation presenting with chest discomfort, right-sided weakness, slurred  speech and right facial droop. She did not receive IV t-PA due to late presentation. S/P stent assisted angioplasty of distal basilar artery.  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 Worsening symptoms with extension of pontine/medullary infarcts with new small bilateral cerebellar infarcts without evidence of stent re-stenosis or occlusion  Remains stable  VTE prophylaxis - heparin subq  on Brilinta 90 mg bid and ASA 81 mg daily.   Therapy recommendations:  SNF  Disposition:  SNF.   Medically ready for d.c  D/c paperwork completed 10/14/18.   Abnormal uterine bleeding, improved  Has been following with GYN  Was on megace in the past  CT abd/pelvis 09/03/18 - enlarged fibroid uterus  continue megace 80mg  bid  Continue ASA and brilinta for now  OBGYN recs appreciated  Received prmarin 25mg  IV once - vaginal bleeding much improved  Anemia, improving  Hb 6.8->PRBC->7.1--->9.5  Likely due to iron deficiency, heavy periods and acute uterine bleeding and abdominal wall surgeries  Iron panel showed iron deficiency  On iron solutions  PRBC transfusion 1 U 09/16/2018 and 2U 09/07/18 and 2U 09/21/18  Intracranial stenosis  BA mid to distal severe stenosis s/p BA stenting  Left PCA severe stenosis, R PCA moderate stenosis  Left MCA moderate stenosis  Uncontrolled stroke risk factors with HLD, HTN, DM  Non  compliance with meds at home  Respiratory failure  S/p trach 08/13/2018  On trach collar now  Able to speak with [name] valve although dysarthric  Trach secretion improving   Not a candidate for decannulation at this time due to weak cough  Hypertension  Stable so far  BP goal 120-150   On Coreg 25 bid, amlodipine and hydralazine  Hyperlipidemia  Lipid lowering medication PTA:  Lipitor 20 mg daily  LDL 174, goal < 70  Now on Lipitor 80 mg daily  Continue statin at  discharge  Diabetes  Glucoses 120-140s  HgbA1c 11.4, goal < 7.0  Lantus 15U bid   Basal NovoLog 3U Q4h  SSI  CBG monitoring  Hypernatremia -> hyponatremia-> hypernatremia  Na 143->145->143  On TF @ 65  On free water 150cc Q4h  Dysphagia s/p PEG complicated by abdominal wall abscess  PEG done 08/31/2018  Removed and replaced dislodged PEG 09/04/2018  CT showed Abdominal wall abscess - s/p open surgical drainage 08/12/2018 persistent foul odor discharge. Reexploration 10/01/2018  Wound debrided appropriately and sutures removed.  No evisceration  Continue tube feeding @ 45 cc/h  Trauma surgery saw today - will get sutures out of PEG  Other Stroke Risk Factors  Obesity, Body mass index is 43.52 kg/m., recommend weight loss, diet and exercise as appropriate   Other Active Problems  Thrombocytosis - likely due to anemia, [facility] day # [place], MSN, APRN, ANVP-BC, AGPCNP-BC Advanced Practice Stroke Nurse [place] for Schedule & Pager information 10/15/2018 1:09 PM  I have personally obtained history,examined this patient, reviewed notes, independently viewed imaging studies, participated in medical decision making and plan of care.ROS completed by me personally and pertinent positives fully documented  I have made any additions or clarifications directly to the above note. Agree with note above.    Antony Contras, MD Medical Director [facility] Pager: [phone] 10/15/2018 2:34 PM  To contact Stroke Continuity provider, please refer to [email or web address] After hours, contact General Neurology

## 2018-10-15 NOTE — Progress Notes (Signed)
RT NOTE:  Pt transferring to 3W01 by RN. Venti Mask setup for transport. Report given to [facility], RRT.

## 2018-10-15 NOTE — Progress Notes (Signed)
PEG still has stitches in, can these be removed asap prior to any transfer? Thanks. Simmie Davies RN

## 2018-10-16 DIAGNOSIS — J9611 Chronic respiratory failure with hypoxia: Secondary | ICD-10-CM

## 2018-10-16 LAB — GLUCOSE, CAPILLARY
GLUCOSE-CAPILLARY: 128 mg/dL — AB (ref 70–99)
GLUCOSE-CAPILLARY: 82 mg/dL (ref 70–99)
Glucose-Capillary: 43 mg/dL — CL (ref 70–99)
Glucose-Capillary: 139 mg/dL — ABNORMAL HIGH (ref 70–99)
Glucose-Capillary: 108 mg/dL — ABNORMAL HIGH (ref 70–99)

## 2018-10-16 NOTE — Social Work (Addendum)
5:02pm- have still not received call back from either [facility] or Medicaid worker  11:19am- CSW left message with Clarene Critchley at [facility] for any updates on placement offer. Await return call  11:18am- CSW received voice message from Jacques Navy with Medicaid/Financial Counseling. Pt Medicaid application has been submitted to LTC Medicaid counselor. Await return call with that individuals information.  Westley Hummer, MSW, [place] Work [phone]

## 2018-10-16 NOTE — Progress Notes (Addendum)
STROKE TEAM PROGRESS NOTE   SUBJECTIVE (INTERVAL HISTORY) No change in patient condition overnight.  She was transferred to the floor since yesterday rounds.  No family present.  Patient now states she does not want to go to [facility].  Care management following..  OBJECTIVE Vitals:   10/16/18 0402 10/16/18 0827 10/16/18 1124 10/16/18 1132  BP:  (!) 157/85 (!) 142/79 (!) 142/79  Pulse:  78 76 73  Resp:  14 19 16   Temp:  99 F (37.2 C) 98.3 F (36.8 C)   TempSrc:  Oral Oral   SpO2:  97% 96% 95%  Weight: 122 kg     Height:        CBC:  Recent Labs  Lab 10/13/18 1126 10/15/18 1110  WBC 10.4 10.1  HGB 9.8* 9.5*  HCT 34.0* 31.4*  MCV 87.4 85.6  PLT 508* 466*    Basic Metabolic Panel:  Recent Labs  Lab 10/13/18 1126 10/15/18 1110  NA 143 139  K 3.9 4.0  CL 112* 108  CO2 21* 22  GLUCOSE 181* 173*  BUN 30* 25*  CREATININE 0.72 0.57  CALCIUM 11.3* 11.0*    IMAGING No results found.    PHYSICAL EXAM per Dr. Leonie Man General -  Obese middle-aged African-American lady, on trach collar, not in distress. She has abdominal surgical wound and drainage from the abdominal wall, dressing clean Cardiovascular - regular rate and rhythm Neurological Exam ;  - on trach collar, eyes open, awake, alert. No ptosis or EOMI, denies diplopia.  PERRL. Bilateral facial weakness, tongue protrusion weak. Able to wiggle left hand fingers and left foot toes. 2-/5 LLE and 2/5 LUE. Follows commands and good eye contact.  Speech is moderate dysarthric with speaking valve.   Dense right hemiplegia persists.   ASSESSMENT/PLAN [name] is a 57 y.o. female with history of difficult to control hypertension, insulin-dependent diabetes, obesity, hypothyroidism status post ablation presenting with chest discomfort, right-sided weakness, slurred speech and right facial droop. She did not receive IV t-PA due to late presentation. S/P stent assisted angioplasty of distal basilar  artery.  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 Worsening symptoms with extension of pontine/medullary infarcts with new small bilateral cerebellar infarcts without evidence of stent re-stenosis or occlusion  Remains stable  VTE prophylaxis - heparin subq  on Brilinta 90 mg bid and ASA 81 mg daily.   Therapy recommendations:  SNF  Disposition:  SNF.   Medically ready for d.c  D/c paperwork completed 10/14/18.   Abnormal uterine bleeding, improved  Has been following with GYN  Was on megace in the past  CT abd/pelvis 09/03/18 - enlarged fibroid uterus  continue megace 80mg  bid  Continue ASA and brilinta for now  OBGYN recs appreciated  Received prmarin 25mg  IV once - vaginal bleeding much improved  Anemia, improving  Hb 6.8->PRBC->7.1--->9.5  Likely due to iron deficiency, heavy periods and acute uterine bleeding and abdominal wall surgeries  Iron panel showed iron deficiency  On iron solutions  PRBC transfusion 1 U 09/24/2018 and 2U 09/07/18 and 2U 09/21/18  Intracranial stenosis  BA mid to distal severe stenosis s/p BA stenting  Left PCA severe stenosis, R PCA moderate stenosis  Left MCA moderate stenosis  Uncontrolled stroke risk factors with HLD, HTN, DM  Non compliance with meds at home  Respiratory failure  S/p trach 08/22/2018  Able to speak with Andrea [name] valve although dysarthric  Per CCM Not  a candidate for decannulation at this time due to weak cough, and may not be for some time if at all  Maintain trach collar  Maintain current trach type and size  CCM to continue to follow  Hypertension  Stable so far  BP goal 130-160s  On Coreg 25 bid, amlodipine and hydralazine  Hyperlipidemia  Lipid lowering medication PTA:  Lipitor 20 mg daily  LDL 174, goal < 70  Now on Lipitor 80 mg daily  Continue statin at discharge  Diabetes  Glucoses  43-130s  HgbA1c 11.4, goal <  7.0  Lantus 15U bid   Basal NovoLog 3U Q4h  SSI  CBG monitoring  Hypernatremia -> hyponatremia-> hypernatremia  Na 143->145->143  On TF @ 65  On free water 150cc Q4h  Dysphagia s/p PEG complicated by abdominal wall abscess  PEG done 08/08/2018  Removed and replaced dislodged PEG 08/18/2018  CT showed Abdominal wall abscess - s/p open surgical drainage 09/01/18 persistent foul odor discharge. Reexploration 10/03/2018  Wound debrided appropriately and sutures removed.  No evisceration  Continue tube feeding @ 45 cc/h  Trauma surgery saw today - will get sutures out of PEG  Other Stroke Risk Factors  Obesity, Body mass index is 46.17 kg/m., recommend weight loss, diet and exercise as appropriate   Other Active Problems  Thrombocytosis - likely due to anemia, 466  Oral thrush on nystatin mouthwash.  Frequent mouth care  Hospital day # [place], MSN, APRN, ANVP-BC, AGPCNP-BC Advanced Practice Stroke Nurse [place] for Schedule & Pager information 10/16/2018 3:09 PM  I have personally obtained history,examined this patient, reviewed notes, independently viewed imaging studies, participated in medical decision making and plan of care.ROS completed by me personally and pertinent positives fully documented  I have made any additions or clarifications directly to the above note. Agree with note above.   Antony Contras, MD To contact Stroke Continuity provider, please refer to [email or web address] After hours, contact General Neurology

## 2018-10-16 NOTE — Progress Notes (Signed)
NAME:  Andrea [name], MRN:  [id number], DOB:  1961-03-05, LOS: 65 ADMISSION DATE:  08/18/2018, CONSULTATION DATE:  08/15/2018 REFERRING MD:  Dr. Rory Percy, CHIEF COMPLAINT:  CP/ Acute CVA  Brief History   77 yoF w/poorly controlled HTN and IDDM presenting with CP and left arm pain w/BP 220/117. Initial CTH neg.  Cardiac workup negative for acute event thus far, cardiology following.  Code stroke initiated for R hemiplegia, dysarthria and right facial droop.  Out of window for TPA.  Requiring cleviprex for BP control.  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  Evolving pontine stroke on MRI. Intubated 10/10 for neuro IR s/p stenting and angioplasty with 80-90% patency.  Extubated post procedure but due to insufficency placed on BiPAP and brought to ICU.  Required reintubation and ultimately trach (10/21 JY).  Peg tube placed 10/21 by CCS.  Taken to OR 10/28 for open gastrostomy and I&D of abdominal wall abscess. 11/1 taken back to OR for repeat ex-lap.     Past Medical History  HTN, IDDM, hypothyroidism s/p ablation  Significant Hospital Events   10/9 present to [facility] ED -transferred to [facility] 10/9 basilar artery stenting 10/9 intubated urgently 6h post procedure for inability to protect airway. 10/10 CTA for declining exam - evolving pontine infarct, stent deemed to be patent 10/14 Brillinta stopped and ASA/ heparin started to in anticipation of tracheostomy on Monday. 10/14 through 10/19: Neuro exam has been stable sodium increasing in spite of free water via tube, providing supportive care awaiting trach 10/20: Sodium down to 151 from 156 clinically looks the same 10/21: Sodium down to 148, sputum culture sent for purulent tracheal secretions, awaiting trach and PEG.  Purulent bronchial secretions from right upper lobe sent for BAL 10/22: Tracheostomy and PEG completed on the 21st tolerated well.  Spiking low-grade fever white cells continued to climb starting empiric  vancomycin and ceftaz.  Sodium improved down to 146.  Blood pressure improving but still not at goal added hydrochlorothiazide.  Resumed Brilinta.  10/23: Sodium normal.  Backing down on free water.  Glycemic control challenging, increased basal NovoLog in addition to Lantus and sliding scale.  Still spiking fever, white blood cell count climbing, sending blood cultures.  Foley catheter being placed for pressure ulcer caused by pure wick device 10/27 Foul smelling dark trach secretions, PEG secretions  10/28 to OR for ex lap due to PEG dislodgement.  PEG replaced 10/29 remains on MV 2/2 tachypnea; family meeting 10/30 no weaning 2/2 to ongoing tachypnea; foul smelling drainage from abd site 10/31 no weaning, abx changed, family meeting 11/1 repeat Ex-lap for I&D of absces 11/1 tolerated > 48h trach collar trial 11/7 on ATC since 11/12 trach changed 11/13 not tolerating PMV 11/15 developed heavy vaginal bleeding. GYN called. Recommended transvag US.  11/16 started on IV estrogen per recs of GYN, also told to cont megace. Vaginal bleeding improving 11/17 copious Trach secretions. Neuro added HT saline nebs got 2 units PRBC for hgb of 7 11/19 seen by SLP. Has made progress. Repeat FEEs ordered  Consults: date of consult/date signed off & final recs:  Cards 10/10 Neurology 10/10 PCCM 10/10 CCS 10/17  Procedures (surgical and bedside):  10/10 Neuro IR >> s/p stent and angioplasty of distal basilar artery  10/10 ETT for procedure 10/10 ETT (reintubated) >> removed 10/21 10/10 Size 6 tracheostomy placed by Dr. Nelda Marseille 10/26 changed to #4 trach cuffless  10/28 Ex lap with replacement of gastrostomy tube due  to dislodgement 11/1 repeat Ex-lap with I&D of abdominal wall abscess  11/12 trach change 12/9 trach change  Significant Diagnostic Tests:  10/10  CTH >>  No acute intracranial abnormalities. Mild white matter changes likely due to small vessel ischemia 10/10 CTH >> No acute abnormality  and no change from earlier today 10/10 CTA head and neck >> Extensive atherosclerotic disease in the basilar with focal critical stenosis in the mid to distal basilar. No definite acute thrombus identified. Severe stenosis left posterior cerebral artery and moderate stenosis right posterior cerebral artery. Mild stenosis left MCA and moderate stenosis left MCA bifurcation. No significant carotid or vertebral artery stenosis in the neck. 10/10 MRI >> evolving pontine infarct. 10/11 ECHO >> normal LVSF with severe LVH. 10/28 CT A / P > dislodged PEG tube outside of the peritoneum and not within the stomach.  Cholelithiasis. No bowel obstruction.  No hydronephrosis.  10/30 CT abd/pelvis >> Mild bilateral atelectasis.  Interval placement of new gastrostomy tube with tip and balloon within gastric lumen. Surgical drain is seen in the soft tissues in previous gastrostomy site. Small amount of gas and stranding is seen in the peritoneal fat anterior and inferior to the stomach which most likely is related to gastrostomy placement. Probable surgical wound seen in right upper quadrant of the anterior abdominal wall.  8.2 x 2.3 cm gas and fluid collection is seen in the anterior abdominal wall which is unchanged compared to prior exam. Possible abscess cannot be excluded.  Enlarged fibroid uterus.  11/14 B LE venous dopplers  >>  Micro Data:  10/10 MRSA PCR >> negative 10/21 Sputum culture >> nl resp flora  10/23 Blood cultures x 2 >> negative 10/26 Blood cultures x 2 >> neg 10/28 MRSA PCR >> neg 10/27 Sputum culture >> normal flora 11/1 Abdominal abscess> kelbsiella, proteus, susceptible to imipenem, amp-sulbactam, ESBL  Antimicrobials:  10/10 cefazolin preop 10/22 Vancomycin >> 10/30 10/22 Ceftaz >> 10/31 10/31 ceftriaxone >> 11/4 10/31 flagyl >> 11/4 11/4 meropenem >> 11/9  Subjective:  No events overnight, no new complaints  Objective   Blood pressure (!) 157/85, pulse 78, temperature 99  F (37.2 C), temperature source Oral, resp. rate 14, height _0  (1.626 m), weight 122 kg, last menstrual period 07/26/2018, SpO2 97 %.    FiO2 (%):  [21 %-28 %] 28 %   Intake/Output Summary (Last 24 hours) at 10/16/2018 1056 Last data filed at 10/15/2018 1745 Gross per 24 hour  Intake 50 ml  Output 650 ml  Net -600 ml   Filed Weights   09/19/18 0500 10/08/18 0615 10/16/18 0402  Weight: 123 kg 115 kg 122 kg   Examination:  Gen:      Chronically ill appearing, NAD HEENT:  [place]/AT, PERRL, EOM-spontaneous and MMM Neck:      Trach in place Lungs:    CTA bilaterally CV:         RRR, Nl S1/S2 and -M/R/G Abd:      Soft, NT, ND and +BS Ext:    -edema and -tenderness Skin:       Intact Neuro:    Awake, oriented, follows commands.  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  HTN Neuropathic pain  Uterine bleeding Acute blood loss anemia  Wound care s/p I&D of abd wall abscess DM hypothyroidism  Current Pulmonary specific problem list    Tracheostomy dependence d/t ineffective airway clearance & poor cough mechanics after CVA - s/p JY trach 10/10 - on ATC since 11/6  -She continues to have weak cough mechanics per speech eval 11/25, not  a candidate for decannulation, and may not be for some time if at all Plan Maintain on trach collar as tolerated No decannulation Maintain current trach type and size Titrate O2 for sat of 88-92% Mobilize as able PRN bronchodilators Routine trach care F/U with the trach clinic upon discharge is stays local  Dysphasia following CVA Failed swallow 11/25>>  weak cough Plan TF per PEG SLP following, appreciate input  Oral Thrush Plan Continue  Nystatin mouthwash Frequent mouthcare  PCCM will continue to follow for trach care  Rush Farmer, M.D. [facility] Pulmonary/Critical Care Medicine. Pager: [phone]. After hours pager: [phone].

## 2018-10-17 LAB — CBC
HCT: 33.7 % — ABNORMAL LOW (ref 36.0–46.0)
HEMOGLOBIN: 10.1 g/dL — AB (ref 12.0–15.0)
MCH: 25.7 pg — ABNORMAL LOW (ref 26.0–34.0)
MCHC: 30 g/dL (ref 30.0–36.0)
MCV: 85.8 fL (ref 80.0–100.0)
Platelets: 464 10*3/uL — ABNORMAL HIGH (ref 150–400)
RBC: 3.93 MIL/uL (ref 3.87–5.11)
RDW: 17.4 % — ABNORMAL HIGH (ref 11.5–15.5)
WBC: 10.1 10*3/uL (ref 4.0–10.5)
nRBC: 0 % (ref 0.0–0.2)

## 2018-10-17 LAB — GLUCOSE, CAPILLARY
Glucose-Capillary: 122 mg/dL — ABNORMAL HIGH (ref 70–99)
Glucose-Capillary: 122 mg/dL — ABNORMAL HIGH (ref 70–99)
Glucose-Capillary: 139 mg/dL — ABNORMAL HIGH (ref 70–99)
Glucose-Capillary: 181 mg/dL — ABNORMAL HIGH (ref 70–99)
Glucose-Capillary: 204 mg/dL — ABNORMAL HIGH (ref 70–99)
Glucose-Capillary: 81 mg/dL (ref 70–99)

## 2018-10-17 LAB — BASIC METABOLIC PANEL
Anion gap: 9 (ref 5–15)
BUN: 30 mg/dL — ABNORMAL HIGH (ref 6–20)
CHLORIDE: 107 mmol/L (ref 98–111)
CO2: 23 mmol/L (ref 22–32)
Calcium: 11.5 mg/dL — ABNORMAL HIGH (ref 8.9–10.3)
Creatinine, Ser: 0.61 mg/dL (ref 0.44–1.00)
GFR calc Af Amer: >60 mL/min (ref >60)
GFR calc non Af Amer: >60 mL/min (ref >60)
Glucose, Bld: 126 mg/dL — ABNORMAL HIGH (ref 70–99)
Potassium: 4 mmol/L (ref 3.5–5.1)
Sodium: 139 mmol/L (ref 135–145)

## 2018-10-17 NOTE — Progress Notes (Signed)
STROKE TEAM PROGRESS NOTE   SUBJECTIVE (INTERVAL HISTORY) No change in patient condition overnight.  She was his resting comfortably in bed No family present.  No bed available yet for transfer to skilled nursing facility OBJECTIVE Vitals:   10/17/18 0850 10/17/18 1117 10/17/18 1148 10/17/18 1348  BP:    (!) 142/77  Pulse: 70  78   Resp: (!) 26  (!) 21   Temp:  98.3 F (36.8 C)    TempSrc:  Axillary    SpO2: 99%     Weight:      Height:        CBC:  Recent Labs  Lab 10/15/18 1110 10/17/18 1120  WBC 10.1 10.1  HGB 9.5* 10.1*  HCT 31.4* 33.7*  MCV 85.6 85.8  PLT 466* 464*    Basic Metabolic Panel:  Recent Labs  Lab 10/15/18 1110 10/17/18 1120  NA 139 139  K 4.0 4.0  CL 108 107  CO2 22 23  GLUCOSE 173* 126*  BUN 25* 30*  CREATININE 0.57 0.61  CALCIUM 11.0* 11.5*    IMAGING No results found.    PHYSICAL EXAM per Dr. Leonie Man General -  Obese middle-aged African-American lady, on trach collar, not in distress. She has abdominal surgical wound and drainage from the abdominal wall, dressing clean Cardiovascular - regular rate and rhythm Neurological Exam ;  - on trach collar, eyes open, awake, alert. No ptosis or EOMI, denies diplopia.  PERRL. Bilateral facial weakness, tongue protrusion weak. Able to wiggle left hand fingers and left foot toes. 2-/5 LLE and 2/5 LUE. Follows commands and good eye contact.  Speech is moderate dysarthric with speaking valve.   Dense right hemiplegia persists.   ASSESSMENT/PLAN Ms. BANI GIANFRANCESCO is a 57 y.o. female with history of difficult to control hypertension, insulin-dependent diabetes, obesity, hypothyroidism status post ablation presenting with chest discomfort, right-sided weakness, slurred speech and right facial droop. She did not receive IV t-PA due to late presentation. S/P stent assisted angioplasty of distal basilar artery.  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 Worsening  symptoms with extension of pontine/medullary infarcts with new small bilateral cerebellar infarcts without evidence of stent re-stenosis or occlusion  Remains stable  VTE prophylaxis - heparin subq  on Brilinta 90 mg bid and ASA 81 mg daily.   Therapy recommendations:  SNF  Disposition:  SNF.   Medically ready for d.c  D/c paperwork completed 10/14/18.   Abnormal uterine bleeding, improved  Has been following with GYN  Was on megace in the past  CT abd/pelvis 09/03/18 - enlarged fibroid uterus  continue megace 80mg  bid  Continue ASA and brilinta for now  OBGYN recs appreciated  Received prmarin 25mg  IV once - vaginal bleeding much improved  Anemia, improving  Hb 6.8->PRBC->7.1--->9.5  Likely due to iron deficiency, heavy periods and acute uterine bleeding and abdominal wall surgeries  Iron panel showed iron deficiency  On iron solutions  PRBC transfusion 1 U 09/28/2018 and 2U 09/07/18 and 2U 09/21/18  Intracranial stenosis  BA mid to distal severe stenosis s/p BA stenting  Left PCA severe stenosis, R PCA moderate stenosis  Left MCA moderate stenosis  Uncontrolled stroke risk factors with HLD, HTN, DM  Non compliance with meds at home  Respiratory failure  S/p trach 08/07/2018  Able to speak with [name] valve although dysarthric  Per CCM Not a candidate for decannulation at this time due to weak cough, and may not  be for some time if at all  Maintain trach collar  Maintain current trach type and size  CCM to continue to follow  Hypertension  Stable so far  BP goal 130-160s  On Coreg 25 bid, amlodipine and hydralazine  Hyperlipidemia  Lipid lowering medication PTA:  Lipitor 20 mg daily  LDL 174, goal < 70  Now on Lipitor 80 mg daily  Continue statin at discharge  Diabetes  Glucoses  43-130s  HgbA1c 11.4, goal < 7.0  Lantus 15U bid   Basal NovoLog 3U Q4h  SSI  CBG monitoring  Hypernatremia -> hyponatremia->  hypernatremia  Na 143->145->143  On TF @ 65  On free water 150cc Q4h  Dysphagia s/p PEG complicated by abdominal wall abscess  PEG done 08/14/2018  Removed and replaced dislodged PEG 08/17/2018  CT showed Abdominal wall abscess - s/p open surgical drainage 08/10/2018 persistent foul odor discharge. Reexploration 10/02/2018  Wound debrided appropriately and sutures removed.  No evisceration  Continue tube feeding @ 45 cc/h  Trauma surgery saw today - will get sutures out of PEG  Other Stroke Risk Factors  Obesity, Body mass index is 46.17 kg/m., recommend weight loss, diet and exercise as appropriate   Other Active Problems  Thrombocytosis - likely due to anemia, 466  Oral thrush on nystatin mouthwash.  Frequent mouth care  Hospital day # 64  Medically stable for transfer to skilled nursing facility but no bed available yet. Social workers are  trying. No family available at the bedside for discussion   Antony Contras, MD To contact Stroke Continuity provider, please refer to [email or web address] After hours, contact General Neurology

## 2018-10-17 NOTE — Progress Notes (Signed)
CSW following for discharge plan. Patient is still awaiting official bed offer from [facility]. CSW spoke with Tabitha in Admissions, and they have sent all of the patient's information to their corporate office for review. Awaiting decision from the [place] office before they will definitively make a bed offer for the patient. CSW requested that Admissions call CSW with any information.  CSW to continue to follow.  Laveda Abbe, [place] Clinical Social Worker [phone]

## 2018-10-17 NOTE — Evaluation (Addendum)
Physical Therapy Progress Note Patient Details Name: Andrea [name] MRN: [id number] DOB: 11-19-1960 Today's Date: 10/17/2018   History of Present Illness  57 yo presented to ED with chest pain noted Rt hemiparesis in ED with acute Left paramedian brainstem infarct. Intubated 10/10 for angioplasty, extubated post procedure and reintubated. Repeat MRI with brainstem infarct extension and bil cerebellar infarcts. Peg/trach with return to vent 08/06/2018. 10/22-29 on trach collar, return to vent 10/29. 10/28 I & D of abdominal wall abscess with placement of penrose drain and G-tube, 11/1 repeat ex lap. 11/7 return to trach collar.  PMHx: HTN, DM, CKD  Clinical Impression  Pt willing and participative.  Emphasis on ROM, rolling for peri care, sitting balance and transfers bed to recliner.    Follow Up Recommendations SNF;Supervision/Assistance - 24 hour    Equipment Recommendations  Hospital bed;Wheelchair (measurements PT)    Recommendations for Other Services       Precautions / Restrictions Precautions Precaution Comments: Peg/trach,      Mobility  Bed Mobility Overal bed mobility: Needs Assistance Bed Mobility: Supine to Sit;Rolling Rolling: Total assist;+2 for safety/equipment   Supine to sit: +2 for physical assistance     General bed mobility comments: rolling for peri care, but used the pad to pivot and assisted pt up via right elbow to sit  Transfers Overall transfer level: Needs assistance   Transfers: Squat Pivot Transfers     Squat pivot transfers: Total assist;+2 safety/equipment     General transfer comment: pt unable to assist with upper or lower extemities.  She attempted to bear hug during a face to face pivot transfer  Ambulation/Gait                Stairs            Wheelchair Mobility    Modified Rankin (Stroke Patients Only) Modified Rankin (Stroke Patients Only) Pre-Morbid Rankin Score: No symptoms Modified Rankin: Severe  disability     Balance Overall balance assessment: Needs assistance Sitting-balance support: Feet supported;Bilateral upper extremity supported Sitting balance-Leahy Scale: Poor                                       Pertinent Vitals/Pain Pain Assessment: Faces Faces Pain Scale: Hurts even more Pain Location: right leg Pain Descriptors / Indicators: Discomfort;Grimacing Pain Intervention(s): Repositioned    Home Living                        Prior Function                 Hand Dominance        Extremity/Trunk Assessment                Communication      Cognition Arousal/Alertness: Awake/alert Behavior During Therapy: WFL for tasks assessed/performed Overall Cognitive Status: Impaired/Different from baseline                     Current Attention Level: Sustained Memory: Decreased short-term memory Following Commands: Follows one step commands with increased time;Follows one step commands inconsistently Safety/Judgement: Decreased awareness of deficits Awareness: Emergent Problem Solving: Slow processing;Decreased initiation;Difficulty sequencing;Requires verbal cues;Requires tactile cues        General Comments      Exercises Other Exercises Other Exercises: warm up hip/knee rom exercise prior to mobility   Assessment/Plan  PT Assessment    PT Problem List         PT Treatment Interventions      PT Goals (Current goals can be found in the Care Plan section)  Acute Rehab PT Goals Patient Stated Goal: to get stronger PT Goal Formulation: With patient Time For Goal Achievement: 10/15/18 Potential to Achieve Goals: Fair    Frequency Min 2X/week   Barriers to discharge        Co-evaluation               AM-PAC PT "6 Clicks" Mobility  Outcome Measure Help needed turning from your back to your side while in a flat bed without using bedrails?: Total Help needed moving from lying on your back to  sitting on the side of a flat bed without using bedrails?: Total Help needed moving to and from a bed to a chair (including a wheelchair)?: Total Help needed standing up from a chair using your arms (e.g., wheelchair or bedside chair)?: Total Help needed to walk in hospital room?: Total Help needed climbing 3-5 steps with a railing? : Total 6 Click Score: 6    End of Session   Activity Tolerance: Patient tolerated treatment well Patient left: in chair;with call bell/phone within reach;with chair alarm set;Other (comment)(maxi lift pad) Nurse Communication: Mobility status;Need for lift equipment Hemiplegia - Right/Left: Right Hemiplegia - dominant/non-dominant: Dominant Hemiplegia - caused by: Cerebral infarction    Time: 7680-8811 PT Time Calculation (min) (ACUTE ONLY): 66 min   Charges:     PT Treatments $Therapeutic Exercise: 8-22 mins $Therapeutic Activity: 8-22 mins $Self Care/Home Management: 23-37        10/17/2018  Donnella Sham, PT Acute Rehabilitation Services [phone]  (pager) [phone]  (office)  Tessie Fass Rowe Warman 10/17/2018, 5:47 PM

## 2018-10-18 LAB — GLUCOSE, CAPILLARY
Glucose-Capillary: 181 mg/dL — ABNORMAL HIGH (ref 70–99)
Glucose-Capillary: 112 mg/dL — ABNORMAL HIGH (ref 70–99)
Glucose-Capillary: 126 mg/dL — ABNORMAL HIGH (ref 70–99)
Glucose-Capillary: 95 mg/dL (ref 70–99)
Glucose-Capillary: 118 mg/dL — ABNORMAL HIGH (ref 70–99)

## 2018-10-18 NOTE — Progress Notes (Addendum)
STROKE TEAM PROGRESS NOTE   SUBJECTIVE (INTERVAL HISTORY) No family at bedside. Pt lying in bed with speaking valve. Has passed swallow now on dys 1 diet with nectar thick liquid. Still has paraplegia. Discussed about [facility], pt is OK with placement over there.     OBJECTIVE Vitals:   10/18/18 0720 10/18/18 0854 10/18/18 1109 10/18/18 1141  BP: 140/74  (!) 156/81   Pulse:  83 79 83  Resp:  (!) 29 (!) 29 (!) 33  Temp:   98.3 F (36.8 C)   TempSrc: Oral  Oral   SpO2:  98% 98% 98%  Weight:      Height:        CBC:  Recent Labs  Lab 10/15/18 1110 10/17/18 1120  WBC 10.1 10.1  HGB 9.5* 10.1*  HCT 31.4* 33.7*  MCV 85.6 85.8  PLT 466* 464*    Basic Metabolic Panel:  Recent Labs  Lab 10/15/18 1110 10/17/18 1120  NA 139 139  K 4.0 4.0  CL 108 107  CO2 22 23  GLUCOSE 173* 126*  BUN 25* 30*  CREATININE 0.57 0.61  CALCIUM 11.0* 11.5*    IMAGING No results found.    PHYSICAL EXAM per Dr. Leonie Man General -  Obese middle-aged African-American lady, on trach collar with speaking valve, not in distress. She has abdominal surgical wound and drainage from the abdominal wall, dressing clean Cardiovascular - regular rate and rhythm Neurological Exam - on trach collar, eyes open, awake, alert. No ptosis or EOMI, denies diplopia.  PERRL. Bilateral facial weakness, tongue protrusion weak. Able to wiggle left hand fingers and left foot toes. 2-/5 LLE proximal and 2/5 distally and 2/5 LUE. Follows commands and good eye contact.  Speech is moderate dysarthric with speaking valve. Sensation not cooperative, ataxia and gait not tested.    ASSESSMENT/PLAN [name] is a 57 y.o. female with history of difficult to control hypertension, insulin-dependent diabetes, obesity, hypothyroidism status post ablation presenting with chest discomfort, right-sided weakness, slurred speech and right facial droop. She did not receive IV t-PA due to late presentation. S/P stent assisted  angioplasty of distal basilar artery.  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 Worsening symptoms with extension of pontine/medullary infarcts with new small bilateral cerebellar infarcts without evidence of stent re-stenosis or occlusion  Remains stable  VTE prophylaxis - heparin subq  on Brilinta 90 mg bid and ASA 81 mg daily.   Therapy recommendations:  SNF  Disposition:  SNF.   Medically ready for d.c  D/c paperwork completed 10/14/18.   Abnormal uterine bleeding, improved  Has been following with GYN  Was on megace in the past  CT abd/pelvis 09/03/18 - enlarged fibroid uterus  continue megace 80mg  bid  Continue ASA and brilinta for now  OBGYN recs appreciated  Received prmarin 25mg  IV once - vaginal bleeding much improved  Anemia, improving  Hb 6.8->PRBC->7.1--->9.5->10.1  Likely due to iron deficiency, heavy periods and acute uterine bleeding and abdominal wall surgeries  Iron panel showed iron deficiency  On iron solutions  PRBC transfusion 1 U 10/01/2018 and 2U 09/07/18 and 2U 09/21/18  Intracranial stenosis  BA mid to distal severe stenosis s/p BA stenting  Left PCA severe stenosis, R PCA moderate stenosis  Left MCA moderate stenosis  Uncontrolled stroke risk factors with HLD, HTN, DM  Non compliance with meds at home  Respiratory failure  S/p trach 08/18/2018  Able to speak with Darron Doom  Muir valve although dysarthric  Per CCM Not a candidate for decannulation at this time due to weak cough, and may not be for some time if at all  Maintain trach collar  Maintain current trach type and size  CCM to continue to follow  Hypertension  Stable so far  BP goal 130-160s  On Coreg 25 bid, amlodipine and hydralazine  Hyperlipidemia  Lipid lowering medication PTA:  Lipitor 20 mg daily  LDL 174, goal < 70  Now on Lipitor 80 mg daily  Continue statin at discharge  Diabetes  Glucoses   43-130s  HgbA1c 11.4, goal < 7.0  Lantus 15U bid   Basal NovoLog 3U Q4h  SSI  CBG monitoring  Hypernatremia -> hyponatremia-> hypernatremia  Na 143->145->143->139  On TF @ 65  On free water 150cc Q4h  Dysphagia s/p PEG complicated by abdominal wall abscess  PEG done 08/14/2018  Removed and replaced dislodged PEG 08/13/2018  CT showed Abdominal wall abscess - s/p open surgical drainage 08/17/2018 persistent foul odor discharge. Reexploration 09/29/2018  Wound debrided appropriately and sutures removed.  No evisceration  Continue tube feeding @ 45 cc/h  Trauma surgery saw today - will get sutures out of PEG  Now passed swallow, on dysphagia 1 diet with nectar thick liquid  Other Stroke Risk Factors  Obesity, Body mass index is 46.55 kg/m., recommend weight loss, diet and exercise as appropriate   Other Active Problems  Thrombocytosis - likely due to anemia, 466  Oral thrush on nystatin mouthwash.  Frequent mouth care  Hospital day # 65   Rosalin Hawking, MD PhD Stroke Neurology 10/18/2018 11:03 PM

## 2018-10-19 ENCOUNTER — Inpatient Hospital Stay (HOSPITAL_COMMUNITY): Payer: Medicaid Other

## 2018-10-19 LAB — GLUCOSE, CAPILLARY
Glucose-Capillary: 112 mg/dL — ABNORMAL HIGH (ref 70–99)
Glucose-Capillary: 119 mg/dL — ABNORMAL HIGH (ref 70–99)
Glucose-Capillary: 92 mg/dL (ref 70–99)

## 2018-10-19 LAB — CBC
HCT: 33.6 % — ABNORMAL LOW (ref 36.0–46.0)
Hemoglobin: 9.9 g/dL — ABNORMAL LOW (ref 12.0–15.0)
MCH: 25.1 pg — ABNORMAL LOW (ref 26.0–34.0)
MCHC: 29.5 g/dL — AB (ref 30.0–36.0)
MCV: 85.1 fL (ref 80.0–100.0)
Platelets: 492 10*3/uL — ABNORMAL HIGH (ref 150–400)
RBC: 3.95 MIL/uL (ref 3.87–5.11)
RDW: 17.4 % — AB (ref 11.5–15.5)
WBC: 9.7 10*3/uL (ref 4.0–10.5)
nRBC: 0 % (ref 0.0–0.2)

## 2018-10-19 LAB — COMPREHENSIVE METABOLIC PANEL
ALT: 18 U/L (ref 0–44)
AST: 17 U/L (ref 15–41)
Albumin: 2.3 g/dL — ABNORMAL LOW (ref 3.5–5.0)
Alkaline Phosphatase: 68 U/L (ref 38–126)
Anion gap: 13 (ref 5–15)
BILIRUBIN TOTAL: 0.6 mg/dL (ref 0.3–1.2)
BUN: 31 mg/dL — ABNORMAL HIGH (ref 6–20)
CO2: 21 mmol/L — ABNORMAL LOW (ref 22–32)
Calcium: 11.5 mg/dL — ABNORMAL HIGH (ref 8.9–10.3)
Chloride: 106 mmol/L (ref 98–111)
Creatinine, Ser: 0.67 mg/dL (ref 0.44–1.00)
GFR calc Af Amer: >60 mL/min (ref >60)
GFR calc non Af Amer: >60 mL/min (ref >60)
Glucose, Bld: 137 mg/dL — ABNORMAL HIGH (ref 70–99)
Potassium: 4 mmol/L (ref 3.5–5.1)
Sodium: 140 mmol/L (ref 135–145)
TOTAL PROTEIN: 7.4 g/dL (ref 6.5–8.1)

## 2018-10-19 LAB — TROPONIN I: Troponin I: <0.03 ng/mL (ref <0.03)

## 2018-10-19 MED ORDER — INSULIN ASPART 100 UNIT/ML ~~LOC~~ SOLN
3.0000 [IU] | Freq: Four times a day (QID) | SUBCUTANEOUS | Status: DC
  Administered 2018-10-20 – 2018-10-30 (×41): 3 [IU] via SUBCUTANEOUS

## 2018-10-19 MED ORDER — INSULIN ASPART 100 UNIT/ML ~~LOC~~ SOLN
0.0000 [IU] | Freq: Three times a day (TID) | SUBCUTANEOUS | Status: DC
  Administered 2018-10-21: 4 [IU] via SUBCUTANEOUS
  Administered 2018-10-21: 7 [IU] via SUBCUTANEOUS
  Administered 2018-10-22: 3 [IU] via SUBCUTANEOUS
  Administered 2018-10-22: 4 [IU] via SUBCUTANEOUS
  Administered 2018-10-22: 7 [IU] via SUBCUTANEOUS
  Administered 2018-10-23 – 2018-10-24 (×4): 4 [IU] via SUBCUTANEOUS
  Administered 2018-10-24 – 2018-10-25 (×3): 3 [IU] via SUBCUTANEOUS
  Administered 2018-10-27: 1 [IU] via SUBCUTANEOUS
  Administered 2018-10-28: 4 [IU] via SUBCUTANEOUS
  Administered 2018-10-28: 3 [IU] via SUBCUTANEOUS
  Administered 2018-10-29: 4 [IU] via SUBCUTANEOUS
  Administered 2018-10-29: 3 [IU] via SUBCUTANEOUS
  Administered 2018-10-31 – 2018-11-01 (×4): 4 [IU] via SUBCUTANEOUS
  Administered 2018-11-01: 7 [IU] via SUBCUTANEOUS
  Administered 2018-11-02 (×2): 3 [IU] via SUBCUTANEOUS
  Administered 2018-11-02 – 2018-11-03 (×3): 4 [IU] via SUBCUTANEOUS
  Administered 2018-11-04 (×3): 3 [IU] via SUBCUTANEOUS
  Administered 2018-11-05: 4 [IU] via SUBCUTANEOUS
  Administered 2018-11-05: 3 [IU] via SUBCUTANEOUS
  Administered 2018-11-06 (×2): 4 [IU] via SUBCUTANEOUS
  Administered 2018-11-07: 3 [IU] via SUBCUTANEOUS
  Administered 2018-11-11: 2 [IU] via SUBCUTANEOUS
  Administered 2018-11-11 – 2018-11-13 (×2): 3 [IU] via SUBCUTANEOUS

## 2018-10-19 NOTE — Progress Notes (Addendum)
STROKE TEAM PROGRESS NOTE   SUBJECTIVE (INTERVAL HISTORY) No family at bedside. Pt lying in bed. Complained of sternal chest pain this am after bouts of coughing. Tele no changes. Troponin < 0.03. CXR negative. Labs stable. Chest pain since resolved. Pt passed swallow and no diet.    OBJECTIVE Vitals:   10/19/18 0512 10/19/18 0744 10/19/18 0915 10/19/18 1200  BP: 139/85 (!) 149/81    Pulse: 75 69 78 75  Resp: 18 18 20 18   Temp: 98.5 F (36.9 C) 98.6 F (37 C)    TempSrc: Oral Oral    SpO2: 96% 96% 98% 98%  Weight:      Height:        CBC:  Recent Labs  Lab 10/17/18 1120 10/19/18 1219  WBC 10.1 9.7  HGB 10.1* 9.9*  HCT 33.7* 33.6*  MCV 85.8 85.1  PLT 464* 492*    Basic Metabolic Panel:  Recent Labs  Lab 10/17/18 1120 10/19/18 1219  NA 139 140  K 4.0 4.0  CL 107 106  CO2 23 21*  GLUCOSE 126* 137*  BUN 30* 31*  CREATININE 0.61 0.67  CALCIUM 11.5* 11.5*    IMAGING  Dg Chest Port 1 View 10/19/2018 IMPRESSION:  No acute cardiopulmonary abnormality seen.      PHYSICAL EXAM per Dr. Leonie Man General -  Obese middle-aged African-American lady, on trach collar with speaking valve, not in distress. She has abdominal surgical wound and drainage from the abdominal wall, dressing clean Cardiovascular - regular rate and rhythm Neurological Exam - on trach collar, eyes open, awake, alert. No ptosis or EOMI, denies diplopia.  PERRL. Bilateral facial weakness, tongue protrusion weak. Able to wiggle left hand fingers and left foot toes. 2-/5 LLE proximal and 2/5 distally and 2/5 LUE. Follows commands and good eye contact.  Speech is moderate dysarthric with speaking valve. Sensation not cooperative, ataxia and gait not tested.    ASSESSMENT/PLAN Andrea [name] is a 57 y.o. female with history of difficult to control hypertension, insulin-dependent diabetes, obesity, hypothyroidism status post ablation presenting with chest discomfort, right-sided weakness, slurred speech  and right facial droop. She did not receive IV t-PA due to late presentation. S/P stent assisted angioplasty of distal basilar artery.  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 Worsening symptoms with extension of pontine/medullary infarcts with new small bilateral cerebellar infarcts without evidence of stent re-stenosis or occlusion  Remains stable  VTE prophylaxis - heparin subq  on Brilinta 90 mg bid and ASA 81 mg daily.   Therapy recommendations:  SNF  Disposition:  SNF.   Medically ready for d.c  D/c paperwork completed 10/14/18.   Abnormal uterine bleeding, improved  Has been following with GYN  Was on megace in the past  CT abd/pelvis 09/03/18 - enlarged fibroid uterus  continue megace 80mg  bid  Continue ASA and brilinta for now  OBGYN recs appreciated  Received prmarin 25mg  IV once - vaginal bleeding much improved  Anemia, improving  Hb 6.8->PRBC->7.1--->9.5->10.1  Likely due to iron deficiency, heavy periods and acute uterine bleeding and abdominal wall surgeries  Iron panel showed iron deficiency  On iron solutions  PRBC transfusion 1 U 09/13/2018 and 2U 09/07/18 and 2U 09/21/18  Intracranial stenosis  BA mid to distal severe stenosis s/p BA stenting  Left PCA severe stenosis, R PCA moderate stenosis  Left MCA moderate stenosis  Uncontrolled stroke risk factors with HLD, HTN, DM  Non compliance with meds at  home  Respiratory failure  S/p trach 08/23/2018  Able to speak with Andrea [name] valve although dysarthric  Per CCM Not a candidate for decannulation at this time due to weak cough, and may not be for some time if at all  Maintain trach collar  Maintain current trach type and size  CCM to continue to follow  Hypertension  Stable so far  BP goal 130-160s  On Coreg 25 bid, amlodipine and hydralazine  Hyperlipidemia  Lipid lowering medication PTA:  Lipitor 20 mg daily  LDL 174,  goal < 70  Now on Lipitor 80 mg daily  Continue statin at discharge  Diabetes  Glucoses  100s  HgbA1c 11.4, goal < 7.0  Lantus 15U bid   Decrease Basal NovoLog to 3U 4 times a day with Vital AF feeding  Decrease frequency of SSI to QAC+QHS  CBG monitoring  Hypernatremia -> hyponatremia-> hypernatremia->normal  Na 143->145->143->139->140  On vital AF 4 times a day  On free water 150cc Q4h  Dysphagia s/p PEG complicated by abdominal wall abscess  PEG done 09/04/2018  Removed and replaced dislodged PEG 08/16/2018  CT showed Abdominal wall abscess - s/p open surgical drainage 08/24/2018 persistent foul odor discharge. Reexploration 09/15/2018  Wound debrided appropriately and sutures removed.  No evisceration  On vital AF 4 times a day with supplement  Trauma surgery got sutures out of PEG  Now passed swallow, on dysphagia 1 diet with nectar thick liquid  Atypical chest pain, resolved  sternal chest pain 10/19/18 after bouts of coughing.   Tele no changes.   Troponin < 0.03.   CXR negative.   Electrolytes WNL  Chest pain since resolved.   Other Stroke Risk Factors  Obesity, Body mass index is 46.17 kg/m., recommend weight loss, diet and exercise as appropriate   Other Active Problems  Thrombocytosis - likely due to anemia, 466->492  Oral thrush on nystatin mouthwash.  Frequent mouth care  Hospital day # 67   I spent  35 minutes in total face-to-face time with the patient, more than 50% of which was spent in counseling and coordination of care, reviewing test results, images and medication, and discussing the diagnosis of chest pain, relative low glucose, dysphagia, treatment plan and potential prognosis. This patient's care requiresreview of multiple databases, neurological assessment, discussion with family, other specialists and medical decision making of high complexity.   Rosalin Hawking, MD PhD Stroke Neurology 10/19/2018 10:20 PM

## 2018-10-20 LAB — GLUCOSE, CAPILLARY
Glucose-Capillary: 100 mg/dL — ABNORMAL HIGH (ref 70–99)
Glucose-Capillary: 101 mg/dL — ABNORMAL HIGH (ref 70–99)
Glucose-Capillary: 105 mg/dL — ABNORMAL HIGH (ref 70–99)
Glucose-Capillary: 115 mg/dL — ABNORMAL HIGH (ref 70–99)
Glucose-Capillary: 120 mg/dL — ABNORMAL HIGH (ref 70–99)
Glucose-Capillary: 133 mg/dL — ABNORMAL HIGH (ref 70–99)
Glucose-Capillary: 92 mg/dL (ref 70–99)
Glucose-Capillary: 99 mg/dL (ref 70–99)

## 2018-10-20 LAB — BASIC METABOLIC PANEL
Anion gap: 9 (ref 5–15)
BUN: 29 mg/dL — ABNORMAL HIGH (ref 6–20)
CO2: 24 mmol/L (ref 22–32)
Calcium: 11.5 mg/dL — ABNORMAL HIGH (ref 8.9–10.3)
Chloride: 107 mmol/L (ref 98–111)
Creatinine, Ser: 0.67 mg/dL (ref 0.44–1.00)
GFR calc Af Amer: >60 mL/min (ref >60)
GFR calc non Af Amer: >60 mL/min (ref >60)
Glucose, Bld: 131 mg/dL — ABNORMAL HIGH (ref 70–99)
Potassium: 3.9 mmol/L (ref 3.5–5.1)
Sodium: 140 mmol/L (ref 135–145)

## 2018-10-20 LAB — CBC
HCT: 35.4 % — ABNORMAL LOW (ref 36.0–46.0)
Hemoglobin: 10.3 g/dL — ABNORMAL LOW (ref 12.0–15.0)
MCH: 25.1 pg — ABNORMAL LOW (ref 26.0–34.0)
MCHC: 29.1 g/dL — ABNORMAL LOW (ref 30.0–36.0)
MCV: 86.1 fL (ref 80.0–100.0)
PLATELETS: 474 10*3/uL — AB (ref 150–400)
RBC: 4.11 MIL/uL (ref 3.87–5.11)
RDW: 17 % — ABNORMAL HIGH (ref 11.5–15.5)
WBC: 10.1 10*3/uL (ref 4.0–10.5)
nRBC: 0 % (ref 0.0–0.2)

## 2018-10-20 MED ORDER — BISACODYL 10 MG RE SUPP
10.0000 mg | Freq: Every day | RECTAL | Status: DC | PRN
  Administered 2018-10-31: 10 mg via RECTAL
  Filled 2018-10-20: qty 1

## 2018-10-20 NOTE — Progress Notes (Signed)
Occupational Therapy Treatment Patient Details Name: Andrea [name] MRN: [id number] DOB: 11/15/1960 Today's Date: 10/20/2018    History of present illness 57 yo presented to ED with chest pain noted Rt hemiparesis in ED with acute Left paramedian brainstem infarct. Intubated 10/10 for angioplasty, extubated post procedure and reintubated. Repeat MRI with brainstem infarct extension and bil cerebellar infarcts. Peg/trach with return to vent 08/23/2018. 10/22-29 on trach collar, return to vent 10/29. 10/28 I & D of abdominal wall abscess with placement of penrose drain and G-tube, 11/1 repeat ex lap. 11/7 return to trach collar.  PMHx: HTN, DM, CKD   OT comments  Focus of session on self feeding. Pt able to state that she needs to "take it easy" when eating but unsure if she could use a straw. Swallowing guidelines checked per ST recommendations and pt is allowed to use a straw with nectar thick liquids. Pt able to use L hand to feed self with hand over hand max A with adapted utensil. Pt able to initiate movement and bring food to mouth. Good awareness of bite size and Diffculty maintaining grip on utensil. Will trial use of U-cuff next visit, Pt very happy about the ability to "kind of do something for myself". Will plan to progress OOB to recliner to go see Christmas tree in lobby. Pt appreciative and brighter affect at end of session..   Follow Up Recommendations  SNF;Supervision/Assistance - 24 hour    Equipment Recommendations  Other (comment)(TBA at SNF)    Recommendations for Other Services      Precautions / Restrictions Precautions Precautions: Fall Precaution Comments: Peg/trach,       Mobility              Balance                                           ADL either performed or assessed with clinical judgement   ADL Overall ADL's : Needs assistance/impaired Eating/Feeding: Sitting;Maximal assistance;With adaptive utensils;Cueing for  safety Eating/Feeding Details (indicate cue type and reason): PMSV on while feeding; focus on hand to mouth pattern. Mod A to maintain grasp on built up spoon. Pt able to bring bolus to mouth with mod A; May trial use of u-cuff due to weak graps and difficulty maintianing grasp on untensil                                         Vision       Perception     Praxis      Cognition Arousal/Alertness: Awake/alert Behavior During Therapy: WFL for tasks assessed/performed Overall Cognitive Status: Impaired/Different from baseline Area of Impairment: Attention;Memory;Safety/judgement;Awareness                   Current Attention Level: Selective Memory: Decreased short-term memory;Decreased recall of precautions Following Commands: Follows one step commands consistently Safety/Judgement: Decreased awareness of safety Awareness: Emergent Problem Solving: Slow processing General Comments: cognition continues to improve        Exercises General Exercises - Upper Extremity Shoulder Flexion: AAROM;Left;PROM;Right;10 reps;Supine Shoulder ABduction: AAROM;Left;PROM;Right;10 reps;Seated Shoulder ADduction: PROM;Right;AAROM;Left;10 reps;Seated Elbow Flexion: PROM;Right;AAROM;Left;15 reps;Seated Elbow Extension: PROM;Right;AAROM;Left;15 reps;Seated Wrist Flexion: PROM;Right;AAROM;Left;10 reps Wrist Extension: PROM;Right;AAROM;Left;15 reps;Seated Digit Composite Flexion: PROM;Right;AAROM;Left;15 reps;Seated Composite Extension: PROM;Right;AAROM;Left;15 reps;Seated Other Exercises Other Exercises:  B ankle dorsiflexion   Shoulder Instructions       General Comments      Pertinent Vitals/ Pain       Pain Assessment: Faces Faces Pain Scale: Hurts even more Pain Location: with extremity ROM Pain Descriptors / Indicators: Discomfort;Grimacing Pain Intervention(s): Limited activity within patient's tolerance  Home Living                                           Prior Functioning/Environment              Frequency  Min 2X/week        Progress Toward Goals  OT Goals(current goals can now be found in the care plan section)  Progress towards OT goals: Progressing toward goals  Acute Rehab OT Goals Patient Stated Goal: to get stronger OT Goal Formulation: With patient/family Time For Goal Achievement: 11/03/18 Potential to Achieve Goals: Fair ADL Goals Pt Will Perform Eating: with mod assist;with adaptive utensils;with assist to don/doff brace/orthosis;sitting Additional ADL Goal #2: Pt will consistently use soft touch call bell 4/4 trials to increase ability to manage self care.  Plan Discharge plan remains appropriate;Frequency remains appropriate    Co-evaluation                 AM-PAC OT "6 Clicks" Daily Activity     Outcome Measure   Help from another person eating meals?: A Lot Help from another person taking care of personal grooming?: A Lot Help from another person toileting, which includes using toliet, bedpan, or urinal?: Total Help from another person bathing (including washing, rinsing, drying)?: Total Help from another person to put on and taking off regular upper body clothing?: Total Help from another person to put on and taking off regular lower body clothing?: Total 6 Click Score: 8    End of Session Equipment Utilized During Treatment: Oxygen  OT Visit Diagnosis: Other abnormalities of gait and mobility (R26.89);Muscle weakness (generalized) (M62.81);Low vision, both eyes (H54.2);Feeding difficulties (R63.3);Other symptoms and signs involving cognitive function;Hemiplegia and hemiparesis Hemiplegia - Right/Left: Right Hemiplegia - dominant/non-dominant: Non-Dominant Hemiplegia - caused by: Nontraumatic intracerebral hemorrhage;Cerebral infarction Pain - part of body: (general discomfort)   Activity Tolerance Patient tolerated treatment well   Patient Left in bed;with call bell/phone  within reach(working with ST)   Nurse Communication Mobility status;Other (comment)(Need for ROM and use of B Prevalon boots)        Time: 1130-1205 OT Time Calculation (min): 35 min  Charges: OT General Charges $OT Visit: 1 Visit OT Treatments $Self Care/Home Management : 23-37 mins  Maurie Boettcher, OT/L   Acute OT Clinical Specialist [place] Pager [phone] Office [phone]    [facility] 10/20/2018, 12:24 PM

## 2018-10-20 NOTE — Progress Notes (Signed)
  Speech Language Pathology Treatment: Dysphagia;Passy Muir Speaking valve  Patient Details Name: Andrea [name] MRN: [id number] DOB: Jul 01, 1961 Today's Date: 10/20/2018 Time: 1200-1222 SLP Time Calculation (min) (ACUTE ONLY): 22 min  Assessment / Plan / Recommendation Clinical Impression  Assisted pt with last part of lunch meal upon OT's departure.  Pt using PMV with only occasional cues needed to increase breath support; demonstrates improved awareness/self-regulation of volume.  Pt tolerating nectar-thick liquids, purees with occasional strong cough; verbal cues needed for single sips from straw and to allow time to breathe between bites/sips.  Overall with good toleration; breath sounds are clear.  Pt demonstrates great motivation to eat.  Posted swallow precautions and PMV recommendations at [facility]. Pt should use valve when staff present, but is unable to remove it independently, so it should be removed by staff prior to leaving her alone.  Pt verbalizes understanding.   HPI HPI: 57 yo presented to ED with chest pain noted Rt hemiparesis in ED with acute Left paramedian brainstem infarct. Intubated 10/10 for angioplasty, extubated post procedure and reintubated. Repeat MRI with brainstem infarct extension and bil cerebellar infarcts. Peg/trach 08/26/2018. FEES 09/15/18, 09/24/18. PMHx: HTN, DM, CKD      SLP Plan  Continue with current plan of care       Recommendations  Diet recommendations: Dysphagia 1 (puree);Nectar-thick liquid Liquids provided via: Straw Medication Administration: Crushed with puree Supervision: Full supervision/cueing for compensatory strategies Compensations: Minimize environmental distractions;Small sips/bites Postural Changes and/or Swallow Maneuvers: Seated upright 90 degrees;Upright 30-60 min after meal      Patient may use Passy-Muir Speech Valve: During all therapies with supervision;Intermittently with supervision;During PO intake/meals         Oral  Care Recommendations: Oral care BID SLP Visit Diagnosis: Dysphagia, oropharyngeal phase (R13.12) Plan: Continue with current plan of care       GO                [name] 10/20/2018, 1:15 PM  Erlinda Solinger L. Tivis Ringer, [place] Office number [phone] Pager [phone]

## 2018-10-20 NOTE — Progress Notes (Addendum)
STROKE TEAM PROGRESS NOTE   SUBJECTIVE (INTERVAL HISTORY) No family at bedside. Patient complains of constipation. States her last BM was yesterday. Will review and address as needed. Awaiting SNF bed. SW working on.    OBJECTIVE Vitals:   10/20/18 1139 10/20/18 1243 10/20/18 1459 10/20/18 1626  BP:  (!) 146/79  (!) 150/78  Pulse: 68 79 80 77  Resp: 19 (!) 34 (!) 28 (!) 27  Temp:  97.8 F (36.6 C)    TempSrc:  Oral  Oral  SpO2: 98% 98% 100% 99%  Weight:      Height:        CBC:  Recent Labs  Lab 10/19/18 1219 10/20/18 1100  WBC 9.7 10.1  HGB 9.9* 10.3*  HCT 33.6* 35.4*  MCV 85.1 86.1  PLT 492* 474*    Basic Metabolic Panel:  Recent Labs  Lab 10/19/18 1219 10/20/18 1100  NA 140 140  K 4.0 3.9  CL 106 107  CO2 21* 24  GLUCOSE 137* 131*  BUN 31* 29*  CREATININE 0.67 0.67  CALCIUM 11.5* 11.5*    IMAGING No results found.    PHYSICAL EXAM  General -  Obese middle-aged African-American lady, on trach collar with speaking valve, not in distress. Her abd is soft, nondistended and nontender. PEG tube in place.  Cardiovascular - regular rate and rhythm Neurological Exam - on trach collar, eyes open, awake, alert. No ptosis or EOMI, denies diplopia.  PERRL. Bilateral facial weakness, tongue protrusion weak. Able to wiggle left hand fingers and left foot toes. 2-/5 LLE proximal and 2/5 distally and 2/5 LUE. Follows commands and good eye contact.  Speech is moderate dysarthric with speaking valve. No aphasia. Sensation intact to touch, cold. gait not tested.    ASSESSMENT/PLAN [name] is a 57 y.o. female with history of difficult to control hypertension, insulin-dependent diabetes, obesity, hypothyroidism status post ablation presenting with chest discomfort, right-sided weakness, slurred speech and right facial droop. She did not receive IV t-PA due to late presentation. S/P stent assisted angioplasty of distal basilar artery.  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 Worsening symptoms with extension of pontine/medullary infarcts with new small bilateral cerebellar infarcts without evidence of stent re-stenosis or occlusion  Remains stable  VTE prophylaxis - heparin subq  on aspirin 81 mg daily PTA. Now on Brilinta 90 mg bid and ASA 81 mg daily.   Therapy recommendations:  SNF  Disposition:  SNF.   Medically ready for d/c  D/c paperwork completed 10/14/18  Abnormal uterine bleeding, stable  Has been following with GYN  Was on megace in the past  CT abd/pelvis 09/03/18 - enlarged fibroid uterus  continue megace 80mg  bid  Continue ASA and brilinta for now  OBGYN recs appreciated  Received prmarin 25mg  IV once - vaginal bleeding much improved  Anemia, resolved  Hb 6.8->PRBC->7.1--->10.3  Likely due to iron deficiency, heavy periods and acute uterine bleeding and abdominal wall surgeries  Iron panel showed iron deficiency  On iron solutions  PRBC transfusion 1 U 09/24/2018 and 2U 09/07/18 and 2U 09/21/18  Intracranial stenosis  BA mid to distal severe stenosis s/p BA stenting  Left PCA severe stenosis, R PCA moderate stenosis  Left MCA moderate stenosis  Uncontrolled stroke risk factors with HLD, HTN, DM  Non compliance with meds at home  Respiratory failure  S/p trach 08/29/2018  Able to speak with Andrea [name] valve although dysarthric  Per  CCM Not a candidate for decannulation at this time due to weak cough, and may not be for some time if at all  Maintain trach collar  Maintain current trach type and size  CCM to continue to follow  Hypertension  Stable so far  BP goal 140-170s  On Coreg 25 bid, amlodipine and hydralazine  Hyperlipidemia  Lipid lowering medication PTA:  Lipitor 20 mg daily  LDL 174, goal < 70  Now on Lipitor 80 mg daily  Continue statin at discharge  Diabetes  Glucoses  100-120s  HgbA1c 11.4, goal < 7.0  Lantus  15U bid   Basal NovoLog to 3U 4 times a day with Vital AF feeding  SSI  QAC+QHS  CBG monitoring QAC+QHS  Hypernatremia -> hyponatremia-> hypernatremia->normal  Na 143->145->143->139->140  On vital AF 4 times a day  On free water 150cc Q4h. Consider decreasing frequency of administration.  Dysphagia s/p PEG complicated by abdominal wall abscess  PEG done 08/06/2018  Removed and replaced dislodged PEG 08/13/2018  CT showed Abdominal wall abscess - s/p open surgical drainage 09/04/2018 persistent foul odor discharge. Reexploration 09/13/2018  Wound debrided appropriately and sutures removed.  No evisceration  On vital AF 4 times a day with supplement  Trauma surgery got sutures out of PEG last week  Now passed swallow, on dysphagia 1 diet with nectar thick liquid -eating < 50% per pt  Atypical chest pain, resolved  sternal chest pain 10/19/18 after bouts of coughing.   Tele no changes.   Troponin < 0.03.   CXR negative.   Electrolytes WNL  Chest pain since resolved.   Other Stroke Risk Factors  Obesity, Body mass index is 45.03 kg/m., recommend weight loss, diet and exercise as appropriate   Other Active Problems  Thrombocytosis - likely due to anemia, [phone]  Oral thrush on nystatin mouthwash.  Frequent mouth care  Reported constipation. bisacodyl prn ordered.   Hospital day # [address]   Andrea Sabin, MSN, APRN, ANVP-BC, AGPCNP-BC Advanced Practice Stroke Nurse [place] [facility] for Schedule & Pager information 10/20/2018 4:36 PM   ATTENDING NOTE: I reviewed above note and agree with the assessment and plan. Pt was seen and examined.   Patient lying in bed, no neuro changes, medically stable.  CCM on board for trach care, still do not think patient candidate for decannulation due to weak cough.  Med sister in the hallway, had long discussion with her about further SNF placement, sister and other family do not want patient go to [facility]  rehab, prefer status local.  However, patient has no offers wheezing [place].  Deemed to be difficult placement case.  Continue current management.  Andrea Hawking, MD PhD Stroke Neurology 10/20/2018 9:29 PM

## 2018-10-20 NOTE — Progress Notes (Signed)
NAME:  Andrea [name], MRN:  [id number], DOB:  April 16, 1961, LOS: 80 ADMISSION DATE:  08/30/2018, CONSULTATION DATE:  08/26/2018 REFERRING MD:  Dr. Rory Percy, CHIEF COMPLAINT:  CP/ Acute CVA  Brief History   25 yoF w/poorly controlled HTN and IDDM presenting with CP and left arm pain w/BP 220/117. Initial CTH neg.  Cardiac workup negative for acute event thus far, cardiology following.  Code stroke initiated for R hemiplegia, dysarthria and right facial droop.  Out of window for TPA.  Requiring cleviprex for BP control.  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  Evolving pontine stroke on MRI. Intubated 10/10 for neuro IR s/p stenting and angioplasty with 80-90% patency.  Extubated post procedure but due to insufficency placed on BiPAP and brought to ICU.  Required reintubation and ultimately trach (10/21 JY).  Peg tube placed 10/21 by [facility].  Taken to OR 10/28 for open gastrostomy and I&D of abdominal wall abscess. 11/1 taken back to OR for repeat ex-lap.     Past Medical History  HTN, IDDM, hypothyroidism s/p ablation  Significant Hospital Events   10/9 present to [facility] ED -transferred to [facility] 10/9 basilar artery stenting 10/9 intubated urgently 6h post procedure for inability to protect airway. 10/10 CTA for declining exam - evolving pontine infarct, stent deemed to be patent 10/14 Brillinta stopped and ASA/ heparin started to in anticipation of tracheostomy on Monday. 10/14 through 10/19: Neuro exam has been stable sodium increasing in spite of free water via tube, providing supportive care awaiting trach 10/20: Sodium down to 151 from 156 clinically looks the same 10/21: Sodium down to 148, sputum culture sent for purulent tracheal secretions, awaiting trach and PEG.  Purulent bronchial secretions from right upper lobe sent for BAL 10/22: Tracheostomy and PEG completed on the 21st tolerated well.  Spiking low-grade fever white cells continued to climb starting empiric  vancomycin and ceftaz.  Sodium improved down to 146.  Blood pressure improving but still not at goal added hydrochlorothiazide.  Resumed Brilinta.  10/23: Sodium normal.  Backing down on free water.  Glycemic control challenging, increased basal NovoLog in addition to Lantus and sliding scale.  Still spiking fever, white blood cell count climbing, sending blood cultures.  Foley catheter being placed for pressure ulcer caused by pure wick device 10/27 Foul smelling dark trach secretions, PEG secretions  10/28 to OR for ex lap due to PEG dislodgement.  PEG replaced 10/29 remains on MV 2/2 tachypnea; family meeting 10/30 no weaning 2/2 to ongoing tachypnea; foul smelling drainage from abd site 10/31 no weaning, abx changed, family meeting 11/1 repeat Ex-lap for I&D of absces 11/1 tolerated > 48h trach collar trial 11/7 on ATC since 11/12 trach changed 11/13 not tolerating PMV 11/15 developed heavy vaginal bleeding. GYN called. Recommended transvag US.  11/16 started on IV estrogen per recs of GYN, also told to cont megace. Vaginal bleeding improving 11/17 copious Trach secretions. Neuro added HT saline nebs got 2 units PRBC for hgb of 7 11/19 seen by SLP. Has made progress. Repeat FEEs ordered  Consults: date of consult/date signed off & final recs:  Cards 10/10 Neurology 10/10 PCCM 10/10 CCS 10/17  Procedures (surgical and bedside):  10/10 Neuro IR >> s/p stent and angioplasty of distal basilar artery  10/10 ETT for procedure 10/10 ETT (reintubated) >> removed 10/21 10/10 Size 6 tracheostomy placed by Dr. Nelda Marseille 10/26 changed to #4 trach cuffless  10/28 Ex lap with replacement of gastrostomy tube due  to dislodgement 11/1 repeat Ex-lap with I&D of abdominal wall abscess  11/12 trach change 12/9 trach change  Significant Diagnostic Tests:  10/10  CTH >>  No acute intracranial abnormalities. Mild white matter changes likely due to small vessel ischemia 10/10 CTH >> No acute abnormality  and no change from earlier today 10/10 CTA head and neck >> Extensive atherosclerotic disease in the basilar with focal critical stenosis in the mid to distal basilar. No definite acute thrombus identified. Severe stenosis left posterior cerebral artery and moderate stenosis right posterior cerebral artery. Mild stenosis left MCA and moderate stenosis left MCA bifurcation. No significant carotid or vertebral artery stenosis in the neck. 10/10 MRI >> evolving pontine infarct. 10/11 ECHO >> normal LVSF with severe LVH. 10/28 CT A / P > dislodged PEG tube outside of the peritoneum and not within the stomach.  Cholelithiasis. No bowel obstruction.  No hydronephrosis.  10/30 CT abd/pelvis >> Mild bilateral atelectasis.  Interval placement of new gastrostomy tube with tip and balloon within gastric lumen. Surgical drain is seen in the soft tissues in previous gastrostomy site. Small amount of gas and stranding is seen in the peritoneal fat anterior and inferior to the stomach which most likely is related to gastrostomy placement. Probable surgical wound seen in right upper quadrant of the anterior abdominal wall.  8.2 x 2.3 cm gas and fluid collection is seen in the anterior abdominal wall which is unchanged compared to prior exam. Possible abscess cannot be excluded.  Enlarged fibroid uterus.  11/14 B LE venous dopplers  >>  Micro Data:  10/10 MRSA PCR >> negative 10/21 Sputum culture >> nl resp flora  10/23 Blood cultures x 2 >> negative 10/26 Blood cultures x 2 >> neg 10/28 MRSA PCR >> neg 10/27 Sputum culture >> normal flora 11/1 Abdominal abscess> kelbsiella, proteus, susceptible to imipenem, amp-sulbactam, ESBL  Antimicrobials:  10/10 cefazolin preop 10/22 Vancomycin >> 10/30 10/22 Ceftaz >> 10/31 10/31 ceftriaxone >> 11/4 10/31 flagyl >> 11/4 11/4 meropenem >> 11/9  Subjective:  No events overnight, no new complaints   Objective   Blood pressure (!) 146/79, pulse 79, temperature  97.8 F (36.6 C), temperature source Oral, resp. rate (!) 34, height _0  (1.626 m), weight 119 kg, last menstrual period 07/26/2018, SpO2 98 %.    FiO2 (%):  [28 %] 28 %   Intake/Output Summary (Last 24 hours) at 10/20/2018 1417 Last data filed at 10/20/2018 0900 Gross per 24 hour  Intake 365 ml  Output 300 ml  Net 65 ml   Filed Weights   10/18/18 0500 10/19/18 0500 10/20/18 0500  Weight: 123 kg 122 kg 119 kg   Examination:  Gen:      Chronically ill appearing female, NAD HEENT:  [place]/AT, PERRL, EOM-I and MMM Neck:      Trach in place Lungs:    CTA bilaterally CV:         RRR, Nl S1/S2 and -M/R/G Abd:      Soft, NT, ND and +BS Ext:    -edema and -tenderness Skin:       Intact Neuro:    Awake, oriented, follows commands.  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  HTN Neuropathic pain  Uterine bleeding Acute blood loss anemia  Wound care s/p I&D of abd wall abscess DM hypothyroidism  Current Pulmonary specific problem list    Tracheostomy dependence d/t ineffective airway clearance & poor cough mechanics after CVA - s/p JY trach 10/10 - Titrate O2 for sat of 88-92% - Weak cough mechanics persist Plan Maintain on TC as tolerated No decannulation Maintain current trach type and size Titrate O2 for sat of 88-92% Mobilize as able PRN bronchodilators Routine trach care F/U with the trach clinic upon discharge is stays local  Dysphasia following CVA Failed swallow 11/25>> weak cough Plan TF per PEG SLP following, appreciate input  Oral Thrush Plan Continue   Nystatin mouthwash Frequent mouthcare  Discussed with PCCM-NP  PCCM will continue to follow for trach care.  Rush Farmer, M.D. [facility] Pulmonary/Critical Care Medicine. Pager: [phone]. After hours pager: [phone]

## 2018-10-20 NOTE — Social Work (Signed)
Message left for Andrea [name] at [employer]. No response. There continues to be no offers under an LOG in the [facility] area.  Westley Hummer, MSW, [place] [employer] [phone]

## 2018-10-20 NOTE — Progress Notes (Signed)
Pt moved from room 3W01 to room 3W18 for pt safety, better visual care of all staff passing by pt's room throughout each shift.  Pt able to use her soft touch call bell to request assistance.

## 2018-10-21 LAB — GLUCOSE, CAPILLARY
GLUCOSE-CAPILLARY: 231 mg/dL — AB (ref 70–99)
Glucose-Capillary: 114 mg/dL — ABNORMAL HIGH (ref 70–99)
Glucose-Capillary: 198 mg/dL — ABNORMAL HIGH (ref 70–99)
Glucose-Capillary: 211 mg/dL — ABNORMAL HIGH (ref 70–99)

## 2018-10-21 NOTE — Progress Notes (Signed)
Nutrition Follow-up  DOCUMENTATION CODES:   Morbid obesity  INTERVENTION:  Let pt attempt to eat at meals, if po intake at a meal is <50% then provide a bolus tube feed via PEG using Vital AF 1.2 formula at volume of 237 ml up to TID.   Additionally provide a bolus feed HS daily.   Provide 30 ml Prostat (or equivalent) TID per tube.   Continue free water flushes of 150 ml q 4 hours between bolus feeds. (MD to adjust as appropriate)  If all four bolus tube feeds are given, regimen to provide 1438 kcal (90% of kcal needs), 116 grams of protein (97% of protein needs), and 1668 ml water.   Provide Ensure Enlive po BID (thickened to nectar thick liquids), each supplement provides 350 kcal and 20 grams of protein  Encourage PO intake.   NUTRITION DIAGNOSIS:   Inadequate oral intake related to inability to eat as evidenced by NPO status; diet advanced; progressing  GOAL:   Patient will meet greater than or equal to 90% of their needs; met with TF  MONITOR:   PO intake, Supplement acceptance, Labs, Skin, I & O's, Weight trends, TF tolerance  REASON FOR ASSESSMENT:   Consult Enteral/tube feeding initiation and management  ASSESSMENT:   Andrea [name] is a 57 yo female with PMH of type 2 diabetes, HTN, CKD III, obesity, and anemia admitted for chest pain. Code Stroke called 10/10 in AM. 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 Intubated 10/10 in ICU.  10/21 trach and PEG.11/1 ex lap, I&D. No plans for decannulation as pt with weak cough.   Pt continues on a dysphagia 1 diet with nectar thick liquids. Meal completion has been varied from 20-50% with most intake of <50%. Pt currently has Ensure ordered with varied intake. RD to continue with current Ensure order to aid to maximize PO intake. Additionally continue letting pt attempt to eat at meals and to provide a bolus tube feed if po intake at that meal is <50% to aid in adequate nutrition needs. Prostat has been ordered to aid  in protein needs. Pt continues to await SNF placement. Labs and medications reviewed.   Diet Order:   Diet Order            DIET - DYS 1 Room service appropriate? Yes; Fluid consistency: Nectar Thick  Diet effective now              EDUCATION NEEDS:   Not appropriate for education at this time  Skin:  Skin Assessment: Skin Integrity Issues: Skin Integrity Issues:: Incisions Stage II: healed Incisions: abdomen  Last BM:  12/16  Height:   Ht Readings from Last 1 Encounters:  09/03/18 5' 4"  (1.626 m)    Weight:   Wt Readings from Last 1 Encounters:  10/21/18 114 kg    Ideal Body Weight:  54.5 kg  BMI:  Body mass index is 43.14 kg/m.  Estimated Nutritional Needs:   Kcal:  1600-1800  Protein:  120-135 grams  Fluid:  > 1.6 L   Andrea Parker, MS, RD, LDN Pager # [phone] After hours/ weekend pager # [phone]

## 2018-10-21 NOTE — Progress Notes (Signed)
Physical Therapy Treatment Patient Details Name: Andrea [name] MRN: [id number] DOB: 10-25-1961 Today's Date: 10/21/2018    History of Present Illness 57 yo presented to ED with chest pain noted Rt hemiparesis in ED with acute Left paramedian brainstem infarct. Intubated 10/10 for angioplasty, extubated post procedure and reintubated. Repeat MRI with brainstem infarct extension and bil cerebellar infarcts. Peg/trach with return to vent 08/29/2018. 10/22-29 on trach collar, return to vent 10/29. 10/28 I & D of abdominal wall abscess with placement of penrose drain and G-tube, 11/1 repeat ex lap. 11/7 return to trach collar.  PMHx: HTN, DM, CKD    PT Comments    Patient is pleasant and participatory. This session focused on bed mobility and transfer OOB to chair with use of Maxi move hoyer lift. Current plan remains appropriate.   Follow Up Recommendations  SNF;Supervision/Assistance - 24 hour     Equipment Recommendations  Hospital bed;Wheelchair (measurements PT)    Recommendations for Other Services       Precautions / Restrictions Precautions Precautions: Fall Precaution Comments: Peg/trach Restrictions Weight Bearing Restrictions: No    Mobility  Bed Mobility Overal bed mobility: Needs Assistance Bed Mobility: Rolling Rolling: Total assist;+2 for safety/equipment         General bed mobility comments: rolling side to side for placement of lift pad  Transfers                    Ambulation/Gait                 Stairs             Wheelchair Mobility    Modified Rankin (Stroke Patients Only) Modified Rankin (Stroke Patients Only) Pre-Morbid Rankin Score: No symptoms Modified Rankin: Severe disability     Balance Overall balance assessment: Needs assistance Sitting-balance support: Feet supported;Bilateral upper extremity supported Sitting balance-Leahy Scale: Poor                                      Cognition  Arousal/Alertness: Awake/alert Behavior During Therapy: WFL for tasks assessed/performed Overall Cognitive Status: Impaired/Different from baseline Area of Impairment: Attention;Memory;Safety/judgement;Awareness                   Current Attention Level: Selective Memory: Decreased short-term memory;Decreased recall of precautions Following Commands: Follows one step commands consistently Safety/Judgement: Decreased awareness of safety Awareness: Emergent Problem Solving: Slow processing General Comments: cognition continues to improve      Exercises General Exercises - Lower Extremity Ankle Circles/Pumps: AAROM;10 reps;Left;PROM;Right;Supine Heel Slides: AAROM;10 reps;Left;Supine;PROM;Right    General Comments        Pertinent Vitals/Pain Pain Assessment: Faces Faces Pain Scale: Hurts even more Pain Location: with extremity ROM Pain Descriptors / Indicators: Grimacing;Sore Pain Intervention(s): Limited activity within patient's tolerance;Monitored during session;Repositioned    Home Living                      Prior Function            PT Goals (current goals can now be found in the care plan section) Acute Rehab PT Goals Patient Stated Goal: to get stronger Progress towards PT goals: Progressing toward goals    Frequency    Min 2X/week      PT Plan Current plan remains appropriate    Co-evaluation  AM-PAC PT "6 Clicks" Mobility   Outcome Measure  Help needed turning from your back to your side while in a flat bed without using bedrails?: Total Help needed moving from lying on your back to sitting on the side of a flat bed without using bedrails?: Total Help needed moving to and from a bed to a chair (including a wheelchair)?: Total Help needed standing up from a chair using your arms (e.g., wheelchair or bedside chair)?: Total Help needed to walk in hospital room?: Total Help needed climbing 3-5 steps with a railing? :  Total 6 Click Score: 6    End of Session Equipment Utilized During Treatment: Oxygen Activity Tolerance: Patient tolerated treatment well Patient left: in chair;with call bell/phone within reach;with chair alarm set;Other (comment)(maxi lift pad) Nurse Communication: Mobility status;Need for lift equipment PT Visit Diagnosis: Other abnormalities of gait and mobility (R26.89);Muscle weakness (generalized) (M62.81);Other symptoms and signs involving the nervous system (R29.898);Hemiplegia and hemiparesis Hemiplegia - Right/Left: Right Hemiplegia - dominant/non-dominant: Dominant Hemiplegia - caused by: Cerebral infarction     Time: 1440-1525 PT Time Calculation (min) (ACUTE ONLY): 45 min  Charges:  $Therapeutic Activity: 23-37 mins                     Earney Navy, PTA Acute Rehabilitation Services Pager: [phone] Office: [phone]     Darliss Cheney 10/21/2018, 4:29 PM

## 2018-10-21 NOTE — Progress Notes (Addendum)
STROKE TEAM PROGRESS NOTE   SUBJECTIVE (INTERVAL HISTORY) Lying in bed. Beads of sweat on her face. States she is hot. RN reports normoglycemia and afebrile. AC adjusted.   OBJECTIVE Vitals:   10/21/18 0832 10/21/18 1155 10/21/18 1156 10/21/18 1539  BP:  (!) 159/80 (!) 159/76   Pulse: 80  88 82  Resp: 18 18 18 18   Temp:      TempSrc:      SpO2: 98%  99% 99%  Weight:      Height:        CBC:  Recent Labs  Lab 10/19/18 1219 10/20/18 1100  WBC 9.7 10.1  HGB 9.9* 10.3*  HCT 33.6* 35.4*  MCV 85.1 86.1  PLT 492* 474*    Basic Metabolic Panel:  Recent Labs  Lab 10/19/18 1219 10/20/18 1100  NA 140 140  K 4.0 3.9  CL 106 107  CO2 21* 24  GLUCOSE 137* 131*  BUN 31* 29*  CREATININE 0.67 0.67  CALCIUM 11.5* 11.5*    IMAGING No results found.    PHYSICAL EXAM  General -  Obese middle-aged African-American lady, on trach collar with speaking valve, not in distress. Her abd is soft, nondistended and nontender. PEG tube in place.  Cardiovascular - regular rate and rhythm Neurological Exam - on trach collar, eyes open, awake, alert. No ptosis or EOMI, denies diplopia.  PERRL. Bilateral facial weakness, tongue protrusion weak. Able to wiggle left hand fingers and left foot toes. 2-/5 LLE proximal and 2/5 distally and 2/5 LUE. Follows commands and good eye contact.  Speech is moderate dysarthric with speaking valve. No aphasia. Sensation intact to touch, cold. gait not tested.    ASSESSMENT/PLAN [name] is a 57 y.o. female with history of difficult to control hypertension, insulin-dependent diabetes, obesity, hypothyroidism status post ablation presenting with chest discomfort, right-sided weakness, slurred speech and right facial droop. She did not receive IV t-PA due to late presentation. S/P stent assisted angioplasty of distal basilar artery.  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 Worsening symptoms with  extension of pontine/medullary infarcts with new small bilateral cerebellar infarcts without evidence of stent re-stenosis or occlusion  Remains stable  VTE prophylaxis - heparin subq  on aspirin 81 mg daily PTA. Now on Brilinta 90 mg bid and ASA 81 mg daily.   Therapy recommendations:  SNF  Disposition:  SNF.   Medically ready for d/c  D/c paperwork completed 10/14/18  Abnormal uterine bleeding, stable  Has been following with GYN  Was on megace in the past  CT abd/pelvis 09/03/18 - enlarged fibroid uterus  continue megace 80mg  bid  Continue ASA and brilinta for now  OBGYN recs appreciated  Received prmarin 25mg  IV once - vaginal bleeding much improved  Anemia, resolved  Hb 6.8->PRBC->7.1--->10.3  Likely due to iron deficiency, heavy periods and acute uterine bleeding and abdominal wall surgeries  Iron panel showed iron deficiency  On iron solutions  PRBC transfusion 1 U 09/12/2018 and 2U 09/07/18 and 2U 09/21/18  Intracranial stenosis  BA mid to distal severe stenosis s/p BA stenting  Left PCA severe stenosis, R PCA moderate stenosis  Left MCA moderate stenosis  Uncontrolled stroke risk factors with HLD, HTN, DM  Non compliance with meds at home  Respiratory failure  S/p trach 09/03/2018  Able to speak with [name] valve although dysarthric  Per CCM Not a candidate for decannulation due to weak cough, and may not  be for some time if at all  Maintain trach collar  Maintain current trach type and size  CCM to continue to follow  Hypertension  Stable so far  BP goal 140-170s  On Coreg 25 bid, amlodipine and hydralazine  Hyperlipidemia  Lipid lowering medication PTA:  Lipitor 20 mg daily  LDL 174, goal < 70  Now on Lipitor 80 mg daily  Continue statin at discharge  Diabetes  Glucoses  100-120s  HgbA1c 11.4, goal < 7.0  Lantus 15U bid   Basal NovoLog to 3U 4 times a day with Vital AF feeding  SSI  QAC+QHS  CBG monitoring  QAC+QHS  Hypernatremia -> hyponatremia-> hypernatremia->normal  Na 143->145->143->139->140  On vital AF 4 times a day  On free water 150cc Q4h. Consider decreasing frequency of administration.  Dysphagia s/p PEG complicated by abdominal wall abscess  PEG done 08/24/2018  Removed and replaced dislodged PEG 08/20/2018  CT showed Abdominal wall abscess - s/p open surgical drainage 08/27/2018 persistent foul odor discharge. Reexploration 09/10/2018  Wound debrided appropriately and sutures removed.  No evisceration  On vital AF 4 times a day with supplement  Trauma surgery got sutures out of PEG last week  Now passed swallow, on dysphagia 1 diet with nectar thick liquid - eating < 50% per pt  Atypical chest pain, resolved  sternal chest pain 10/19/18 after bouts of coughing.   Tele no changes.   Troponin < 0.03.   CXR negative.   Electrolytes WNL  Chest pain since resolved.   Other Stroke Risk Factors  Morbid Obesity, Body mass index is 43.14 kg/m., recommend weight loss, diet and exercise as appropriate   Other Active Problems  Thrombocytosis - likely due to anemia, [id number]  Oral thrush on nystatin mouthwash.  Frequent mouth care  Reported constipation. Continued daily BMs. bisacodyl prn ordered.   No change in pt condition, awaiting SNF placement.   Hospital day # [address]   Andrea Sabin, MSN, APRN, ANVP-BC, AGPCNP-BC Advanced Practice Stroke Nurse [place] for Schedule & Pager information 10/21/2018 3:51 PM   ATTENDING NOTE: I reviewed above note and agree with the assessment and plan. Pt was seen and examined.   No acute event overnight, neuro stable.  BP on the higher end, but stable.  Afebrile.  Hyperglycemia under control.  Continue current management.  Pending SNF placement.  Andrea Hawking, MD PhD Stroke Neurology 10/21/2018 6:05 PM

## 2018-10-21 NOTE — Progress Notes (Addendum)
Occupational Therapy Treatment Patient Details Name: Andrea [name] MRN: [id number] DOB: [birth year] Today's Date: 10/21/2018    History of present illness 57 yo presented to ED with chest pain noted Rt hemiparesis in ED with acute Left paramedian brainstem infarct. Intubated 10/10 for angioplasty, extubated post procedure and reintubated. Repeat MRI with brainstem infarct extension and bil cerebellar infarcts. Peg/trach with return to vent 08/05/2018. 10/22-29 on trach collar, return to vent 10/29. 10/28 I & D of abdominal wall abscess with placement of penrose drain and G-tube, 11/1 repeat ex lap. 11/7 return to trach collar.  PMHx: HTN, DM, CKD   OT comments  Pt pushed around unit in recliner to see Christmas decorations and visit with staff. Pt states, "it's nice to be out of the room". Pt asking to go to lobby to see "big Christmas Tree". Will attempt later this week. Further treatment as noted below. Sister present at end of session. Educated sister on importance of completing ROM as educated earlier with [place] to maintain ROM. Sister verbalized understanding. Will continue to follow.   Follow Up Recommendations  SNF;Supervision/Assistance - 24 hour    Equipment Recommendations  Other (comment)    Recommendations for Other Services      Precautions / Restrictions Precautions Precautions: Fall Precaution Comments: Peg/trach Restrictions Weight Bearing Restrictions: No       Mobility Bed Mobility          General bed mobility comments: OOB in recliner  Transfers                      Balance                                   ADL either performed or assessed with clinical judgement   ADL   Eating/Feeding: Maximal assistance Eating/Feeding Details (indicate cue type and reason): PMSV on while drinking from cup; Able to hold cup with min A to maintain grasp Grooming: Maximal assistance Grooming Details (indicate cue type and reason): wash  mouth                                     Vision       Perception     Praxis      Cognition Arousal/Alertness: Awake/alert Behavior During Therapy: WFL for tasks assessed/performed Overall Cognitive Status: Impaired/Different from baseline Area of Impairment: Attention;Memory;Safety/judgement;Awareness                   Current Attention Level: Selective Memory: Decreased short-term memory;Decreased recall of precautions Following Commands: Follows one step commands consistently Safety/Judgement: Decreased awareness of safety Awareness: Emergent Problem Solving: Slow processing General Comments: cognition continues to improve; appropirate conversation; aware of swallowing precautions;         Exercises Exercises: General Upper Extremity General Exercises - Upper Extremity Shoulder Flexion: AAROM;Left;PROM;Right;10 reps;Supine Shoulder ABduction: AAROM;Left;PROM;Right;10 reps;Seated Shoulder ADduction: PROM;Right;AAROM;Left;10 reps;Seated Shoulder Horizontal ABduction: PROM;AROM;Both;Seated;10 reps Shoulder Horizontal ADduction: AAROM;PROM;Both;10 reps;Supine Elbow Flexion: PROM;Right;AAROM;Left;15 reps;Seated Elbow Extension: PROM;Right;AAROM;Left;15 reps;Seated Wrist Flexion: PROM;Right;AAROM;Left;10 reps Wrist Extension: PROM;Right;AAROM;Left;15 reps;Seated    Shoulder Instructions       General Comments Sister present at end of session; educated sister on importance of completing ROM with BUE as demonstrated earlier. Sister states that "Samya can do more than she thinnks and she just likes to be babyfied" Attempted to  educate sister on the significance of Infinity's functional deficits and that she is unable to complete these movements because of her weakness from the stroke, not because she is "lazy". Sister states "God has brought her this far, God can heal her"    Pertinent Vitals/ Pain       Pain Assessment: Faces Faces Pain Scale: Hurts  little more Pain Location: with extremity ROM Pain Descriptors / Indicators: Grimacing;Sore Pain Intervention(s): Limited activity within patient's tolerance;Repositioned  Home Living                                          Prior Functioning/Environment              Frequency  Min 2X/week        Progress Toward Goals  OT Goals(current goals can now be found in the care plan section)  Progress towards OT goals: Progressing toward goals  Acute Rehab OT Goals Patient Stated Goal: to see the lobby Christmas Tree OT Goal Formulation: With patient/family Time For Goal Achievement: 11/03/18 Potential to Achieve Goals: Fair ADL Goals Pt Will Perform Eating: with mod assist;with adaptive utensils;with assist to don/doff brace/orthosis;sitting Pt Will Perform Grooming: with max assist;bed level Pt Will Perform Upper Body Bathing: with max assist;bed level Pt/caregiver will Perform Home Exercise Program: Increased ROM;With written HEP provided;Both right and left upper extremity Additional ADL Goal #2: Pt will consistently use soft touch call bell 4/4 trials to increase ability to manage self care.  Plan Discharge plan remains appropriate;Frequency remains appropriate    Co-evaluation                 AM-PAC OT "6 Clicks" Daily Activity     Outcome Measure   Help from another person eating meals?: A Lot Help from another person taking care of personal grooming?: A Lot Help from another person toileting, which includes using toliet, bedpan, or urinal?: Total Help from another person bathing (including washing, rinsing, drying)?: Total Help from another person to put on and taking off regular upper body clothing?: Total Help from another person to put on and taking off regular lower body clothing?: Total 6 Click Score: 8    End of Session Equipment Utilized During Treatment: Oxygen  OT Visit Diagnosis: Other abnormalities of gait and mobility  (R26.89);Muscle weakness (generalized) (M62.81);Low vision, both eyes (H54.2);Feeding difficulties (R63.3);Other symptoms and signs involving cognitive function;Hemiplegia and hemiparesis Hemiplegia - Right/Left: Right Hemiplegia - dominant/non-dominant: Non-Dominant Hemiplegia - caused by: Nontraumatic intracerebral hemorrhage;Cerebral infarction Pain - part of body: Hip;Hand;Arm;Knee;Leg   Activity Tolerance Patient tolerated treatment well   Patient Left in chair;with call bell/phone within reach;with family/visitor present;with chair alarm set   Nurse Communication Mobility status;Need for lift equipment        Time: 5176-1607 OT Time Calculation (min): 21 min  Charges: OT General Charges $OT Visit: 1 Visit OT Treatments $Therapeutic Activity: 8-22 mins  Maurie Boettcher, OT/L   Acute OT Clinical Specialist [place] Pager [phone] Office [phone]    [facility] 10/21/2018, 7:09 PM

## 2018-10-22 LAB — GLUCOSE, CAPILLARY
Glucose-Capillary: 140 mg/dL — ABNORMAL HIGH (ref 70–99)
Glucose-Capillary: 218 mg/dL — ABNORMAL HIGH (ref 70–99)
Glucose-Capillary: 160 mg/dL — ABNORMAL HIGH (ref 70–99)
Glucose-Capillary: 133 mg/dL — ABNORMAL HIGH (ref 70–99)

## 2018-10-22 MED ORDER — HEPARIN SODIUM (PORCINE) 5000 UNIT/ML IJ SOLN
5000.0000 [IU] | Freq: Three times a day (TID) | INTRAMUSCULAR | Status: DC
  Administered 2018-10-22 – 2018-11-13 (×67): 5000 [IU] via SUBCUTANEOUS
  Filled 2018-10-22 (×68): qty 1

## 2018-10-22 MED ORDER — FREE WATER
200.0000 mL | Freq: Four times a day (QID) | Status: DC
  Administered 2018-10-22 – 2018-11-13 (×83): 200 mL

## 2018-10-22 NOTE — Social Work (Addendum)
Message left for Tabitha at [facility], await return response regarding bed offer.  Also f/u with Gatha Mayer at [facility] who states that she has transitioned to a new SNF at [facility] and will accept referral for pt there under 30 day LOG.   RN CM Vida Roller updated. CSW continuing to follow for support with disposition.  Westley Hummer, MSW, [place] Work [phone]

## 2018-10-22 NOTE — Progress Notes (Signed)
  Speech Language Pathology Treatment: Dysphagia;Passy Muir Speaking valve  Patient Details Name: Andrea [name] MRN: [id number] DOB: [birth year] Today's Date: 10/22/2018 Time: 0940-1009 SLP Time Calculation (min) (ACUTE ONLY): 29 min  Assessment / Plan / Recommendation Clinical Impression  Pt in good spirits; PMV placed, oral care provided.  Only occasional verbal prompts needed to improve respiratory effort prior to speaking.  Voice quality is clearer; volume improved.  Assisted pt with self-feeding applesauce/nectar liquids with hand-over-hand assist.  Pt is demonstrating excellent toleration of POs. Intermittent cues for single sips of liquid.  Lung sounds remain clear.  Pt is ready for trials of upgraded solids with SLP.     HPI HPI: 57 yo presented to ED with chest pain noted Rt hemiparesis in ED with acute Left paramedian brainstem infarct. Intubated 10/10 for angioplasty, extubated post procedure and reintubated. Repeat MRI with brainstem infarct extension and bil cerebellar infarcts. Peg/trach 08/08/2018. FEES 09/15/18, 09/24/18, 12/9.  Dysphagia 1 diet with nectar-thick liquids initiated after last FEES.  PMHx: HTN, DM, CKD      SLP Plan  Continue with current plan of care       Recommendations  Diet recommendations: Dysphagia 1 (puree);Nectar-thick liquid Liquids provided via: Straw Medication Administration: Crushed with puree Supervision: Full supervision/cueing for compensatory strategies Compensations: Minimize environmental distractions;Small sips/bites Postural Changes and/or Swallow Maneuvers: Seated upright 90 degrees;Upright 30-60 min after meal      Patient may use Passy-Muir Speech Valve: During all therapies with supervision;Intermittently with supervision;During PO intake/meals PMSV Supervision: Full         Oral Care Recommendations: Oral care BID Follow up Recommendations: Skilled Nursing facility SLP Visit Diagnosis: Dysphagia, oropharyngeal phase  (R13.12) Plan: Continue with current plan of care       GO              Pranshu Lyster L. Tivis Ringer, [place] Office number [phone] Pager [phone]   [name] 10/22/2018, 10:11 AM

## 2018-10-22 NOTE — Progress Notes (Signed)
STROKE TEAM PROGRESS NOTE   SUBJECTIVE (INTERVAL HISTORY) No complaints today - "I am doing good".  OBJECTIVE Vitals:   10/22/18 0010 10/22/18 0318 10/22/18 0324 10/22/18 0730  BP: (!) 153/74  140/62 (!) 141/77  Pulse: 78  88 79  Resp: 18  18 18   Temp: 98.3 F (36.8 C)  97.8 F (36.6 C) 98.1 F (36.7 C)  TempSrc: Oral  Oral Oral  SpO2: 100% 99% 98% 99%  Weight:      Height:        CBC:  Recent Labs  Lab 10/19/18 1219 10/20/18 1100  WBC 9.7 10.1  HGB 9.9* 10.3*  HCT 33.6* 35.4*  MCV 85.1 86.1  PLT 492* 474*    Basic Metabolic Panel:  Recent Labs  Lab 10/19/18 1219 10/20/18 1100  NA 140 140  K 4.0 3.9  CL 106 107  CO2 21* 24  GLUCOSE 137* 131*  BUN 31* 29*  CREATININE 0.67 0.67  CALCIUM 11.5* 11.5*    IMAGING No results found.    PHYSICAL EXAM - no change in exam today General -  Obese middle-aged African-American lady, on trach collar with speaking valve, not in distress. Her abd is soft, nondistended and nontender. PEG tube in place.  Cardiovascular - regular rate and rhythm Neurological Exam - on trach collar, eyes open, awake, alert. No ptosis or EOMI, denies diplopia.  PERRL. Bilateral facial weakness, tongue protrusion weak. Able to wiggle left hand fingers and left foot toes. 2-/5 LLE proximal and 2/5 distally and 2/5 LUE. Follows commands and good eye contact.  Speech is moderate dysarthric with speaking valve, hypophonic. No aphasia. Sensation intact to touch, cold. gait not tested.    ASSESSMENT/PLAN [name] is a 57 y.o. female with history of difficult to control hypertension, insulin-dependent diabetes, obesity, hypothyroidism status post ablation presenting with chest discomfort, right-sided weakness, slurred speech and right facial droop. She did not receive IV t-PA due to late presentation. S/P stent assisted angioplasty of distal basilar artery.  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 Worsening symptoms with extension of pontine/medullary infarcts with new small bilateral cerebellar infarcts without evidence of stent re-stenosis or occlusion  Remains stable  VTE prophylaxis - heparin subq, SCDs   on aspirin 81 mg daily PTA. Now on Brilinta 90 mg bid and ASA 81 mg daily.   Therapy recommendations:  SNF  Disposition:  SNF.   Medically ready for d/c  D/c paperwork completed 10/14/18  Abnormal uterine bleeding, stable  Has been following with GYN  Was on megace in the past  CT abd/pelvis 09/03/18 - enlarged fibroid uterus  continue megace 80mg  bid  Continue ASA and brilinta for now  OBGYN recs appreciated  Received prmarin 25mg  IV once - vaginal bleeding much improved  Anemia, resolved  Hb 6.8->PRBC->7.1--->10.3  Likely due to iron deficiency, heavy periods and acute uterine bleeding and abdominal wall surgeries  Iron panel showed iron deficiency  On iron solutions  PRBC transfusion 1 U 09/30/2018 and 2U 09/07/18 and 2U 09/21/18  Intracranial stenosis  BA mid to distal severe stenosis s/p BA stenting  Left PCA severe stenosis, R PCA moderate stenosis  Left MCA moderate stenosis  Uncontrolled stroke risk factors with HLD, HTN, DM  Non compliance with meds at home  Respiratory failure  S/p trach 09/02/2018  Able to speak with Andrea [name] valve although dysarthric, hypophonic  Per CCM Not a candidate for decannulation due to  weak cough, and may not be for some time if at all  Maintain trach collar  Maintain current trach type and size  CCM to continue to follow  Hypertension  Stable so far  BP goal 140-170s  On Coreg 25 bid, amlodipine and hydralazine  Hyperlipidemia  Lipid lowering medication PTA:  Lipitor 20 mg daily  LDL 174, goal < 70  Now on Lipitor 80 mg daily  Continue statin at discharge  Diabetes  Glucoses  100-120s  HgbA1c 11.4, goal < 7.0  Lantus 15U bid   Basal NovoLog to 3U 4 times a day  with Vital AF feeding  SSI  QAC+QHS  CBG monitoring QAC+QHS  Hypernatremia -> hyponatremia-> hypernatremia->normal  Na 143->145->143->139->140  On vital AF 4 times a day  On free water 150cc Q4h. Changed to 200 qid (while awake w/ tube feeding schedule)  Dysphagia s/p PEG complicated by abdominal wall abscess  PEG done 08/06/2018  Removed and replaced dislodged PEG 08/16/2018  CT showed Abdominal wall abscess - s/p open surgical drainage 08/14/2018 persistent foul odor discharge. Reexploration 09/24/2018  Wound debrided appropriately and sutures removed.  No evisceration  On vital AF 4 times a day with supplement  Trauma surgery got sutures out of PEG last week  Now passed swallow, on dysphagia 1 diet with nectar thick liquid - eating < 50% per pt  Atypical chest pain, resolved  sternal chest pain 10/19/18 after bouts of coughing.   Tele no changes.   Troponin < 0.03.   CXR negative.   Electrolytes WNL  Chest pain since resolved.   Other Stroke Risk Factors  Morbid Obesity, Body mass index is 43.14 kg/m., recommend weight loss, diet and exercise as appropriate   Other Active Problems  Thrombocytosis - likely due to anemia, [phone]  Oral thrush on nystatin mouthwash x 6 weeks. d/c nystatin 12/18.  Continue Frequent mouth care  Reported constipation. Continued daily BMs. bisacodyl prn ordered.   Hospital day # [place], MSN, APRN, ANVP-BC, AGPCNP-BC Advanced Practice Stroke Nurse [place] for Schedule & Pager information 10/22/2018 8:23 AM

## 2018-10-23 LAB — GLUCOSE, CAPILLARY
Glucose-Capillary: 118 mg/dL — ABNORMAL HIGH (ref 70–99)
Glucose-Capillary: 122 mg/dL — ABNORMAL HIGH (ref 70–99)
Glucose-Capillary: 183 mg/dL — ABNORMAL HIGH (ref 70–99)
Glucose-Capillary: 188 mg/dL — ABNORMAL HIGH (ref 70–99)
Glucose-Capillary: 239 mg/dL — ABNORMAL HIGH (ref 70–99)

## 2018-10-23 NOTE — Plan of Care (Signed)
  Problem: Education: Goal: Knowledge of General Education information will improve Description Including pain rating scale, medication(s)/side effects and non-pharmacologic comfort measures Outcome: Progressing   Problem: Clinical Measurements: Goal: Will remain free from infection Outcome: Progressing   Problem: Nutrition: Goal: Adequate nutrition will be maintained Outcome: Progressing   Problem: Elimination: Goal: Will not experience complications related to bowel motility Outcome: Progressing   Problem: Safety: Goal: Ability to remain free from injury will improve Outcome: Progressing   Problem: Self-Care: Goal: Ability to communicate needs accurately will improve Outcome: Progressing   Problem: Nutrition: Goal: Dietary intake will improve Outcome: Progressing

## 2018-10-23 NOTE — Progress Notes (Signed)
STROKE TEAM PROGRESS NOTE   SUBJECTIVE (INTERVAL HISTORY) Feels she has too many pills she takes. Will review and pare down as able. Will d/c NSL as not in use. Neuro stable.  OBJECTIVE Vitals:   10/23/18 0418 10/23/18 0609 10/23/18 0756 10/23/18 0816  BP:  (!) 156/86  (!) 154/76  Pulse: 85 79 87 73  Resp: 18 18 18 18   Temp:  97.8 F (36.6 C)    TempSrc:  Oral    SpO2: 97% 98% 99%   Weight:  121 kg    Height:        CBC:  Recent Labs  Lab 10/19/18 1219 10/20/18 1100  WBC 9.7 10.1  HGB 9.9* 10.3*  HCT 33.6* 35.4*  MCV 85.1 86.1  PLT 492* 474*    Basic Metabolic Panel:  Recent Labs  Lab 10/19/18 1219 10/20/18 1100  NA 140 140  K 4.0 3.9  CL 106 107  CO2 21* 24  GLUCOSE 137* 131*  BUN 31* 29*  CREATININE 0.67 0.67  CALCIUM 11.5* 11.5*    IMAGING No results found.    PHYSICAL EXAM - no change in exam again today General -  Obese middle-aged African-American lady, on trach collar with speaking valve, not in distress. Her abd is soft, nondistended and nontender. PEG tube in place.  Cardiovascular - regular rate and rhythm Neurological Exam - on trach collar, eyes open, awake, alert. No ptosis or EOMI, denies diplopia.  PERRL. Bilateral facial weakness, tongue protrusion weak. Able to wiggle left hand fingers and left foot toes. 2-/5 LLE proximal and 2/5 distally and 2/5 LUE. Follows commands and good eye contact.  Speech is moderate dysarthric with speaking valve, hypophonic. No aphasia. Sensation intact to touch, cold. gait not tested.    ASSESSMENT/PLAN Andrea [name] is a 57 y.o. female with history of difficult to control hypertension, insulin-dependent diabetes, obesity, hypothyroidism status post ablation presenting with chest discomfort, right-sided weakness, slurred speech and right facial droop. She did not receive IV t-PA due to late presentation. S/P stent assisted angioplasty of distal basilar artery.  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 Worsening symptoms with extension of pontine/medullary infarcts with new small bilateral cerebellar infarcts without evidence of stent re-stenosis or occlusion  Remains stable  VTE prophylaxis - heparin subq, SCDs   on aspirin 81 mg daily PTA. Now on Brilinta 90 mg bid and ASA 81 mg daily.   Therapy recommendations:  SNF  Disposition:  SNF.   Medically ready for d/c  D/c paperwork completed 10/14/18  Abnormal uterine bleeding, stable  Has been following with GYN  Was on megace in the past  CT abd/pelvis 09/03/18 - enlarged fibroid uterus  continue megace 80mg  bid  Continue ASA and brilinta for now  OBGYN recs appreciated  Received prmarin 25mg  IV once - vaginal bleeding much improved  Anemia, resolved  Hb 6.8->PRBC->7.1--->10.3  Likely due to iron deficiency, heavy periods and acute uterine bleeding and abdominal wall surgeries  Iron panel showed iron deficiency  On iron solutions  PRBC transfusion 1 U 09/20/2018 and 2U 09/07/18 and 2U 09/21/18  Intracranial stenosis  BA mid to distal severe stenosis s/p BA stenting  Left PCA severe stenosis, R PCA moderate stenosis  Left MCA moderate stenosis  Uncontrolled stroke risk factors with HLD, HTN, DM  Non compliance with meds at home  Respiratory failure  S/p trach 08/09/2018  Able to speak with [name] valve although  dysarthric, hypophonic  Per CCM Not a candidate for decannulation due to weak cough, and may not be for some time if at all  Maintain trach collar  Maintain current trach type and size  CCM to continue to follow  Hypertension  Stable so far  BP goal 140-150s  On Coreg 25 bid, amlodipine 10 daily, and hydralazine 25 q 8h  Hyperlipidemia  Lipid lowering medication PTA:  Lipitor 20 mg daily  LDL 174, goal < 70  Now on Lipitor 80 mg daily  Continue statin at discharge  Diabetes  Glucoses  Slightly elevated over past few days.  Likely d/t tube + oral feeding.   HgbA1c 11.4, goal < 7.0  Lantus 15U bid   Basal NovoLog to 3u 4 times a day with Vital AF feeding  SSI  QAC+QHS  CBG monitoring QAC+QHS  Consider increasing insulin.   Hypernatremia -> hyponatremia-> hypernatremia->normal  Na 143->145->143->139->140  On vital AF 4 times a day  On free water 200 qid via tube  Dysphagia s/p PEG complicated by abdominal wall abscess  PEG done 08/06/2018  Removed and replaced dislodged PEG 08/31/2018  CT showed Abdominal wall abscess - s/p open surgical drainage 08/16/2018 persistent foul odor discharge. Reexploration 09/12/2018  Wound debrided appropriately and sutures removed.  No evisceration  On vital AF 4 times a day with supplement  Trauma surgery got sutures out of PEG last week  Now passed swallow, on dysphagia 1 diet with nectar thick liquid - eating < 50% per pt  Atypical chest pain, resolved  sternal chest pain 10/19/18 after bouts of coughing.   Tele no changes.   Troponin < 0.03.   CXR negative.   Electrolytes WNL  Chest pain since resolved.   Other Stroke Risk Factors  Morbid Obesity, Body mass index is 45.79 kg/m., recommend weight loss, diet and exercise as appropriate   Other Active Problems  Thrombocytosis - likely due to anemia, 474  Oral thrush, treated w/ nystatin mouthwash x 6 weeks. Continue Frequent mouth care  ESBL on contact precautions per hospital protocol  Hospital day # [place], MSN, APRN, [employer], AGPCNP-BC Advanced Practice Stroke Nurse [place] for Schedule & Pager information 10/23/2018 9:44 AM

## 2018-10-24 LAB — GLUCOSE, CAPILLARY
GLUCOSE-CAPILLARY: 163 mg/dL — AB (ref 70–99)
Glucose-Capillary: 143 mg/dL — ABNORMAL HIGH (ref 70–99)
Glucose-Capillary: 181 mg/dL — ABNORMAL HIGH (ref 70–99)
Glucose-Capillary: 182 mg/dL — ABNORMAL HIGH (ref 70–99)
Glucose-Capillary: 137 mg/dL — ABNORMAL HIGH (ref 70–99)

## 2018-10-24 MED ORDER — ONDANSETRON HCL 4 MG/5ML PO SOLN
4.0000 mg | Freq: Once | ORAL | Status: AC
  Administered 2018-10-24: 4 mg
  Filled 2018-10-24 (×2): qty 5

## 2018-10-24 MED ORDER — ONDANSETRON HCL 4 MG/2ML IJ SOLN: 4.0000 mg | Freq: Three times a day (TID) | INTRAMUSCULAR | Status: DC

## 2018-10-24 MED ORDER — ONDANSETRON HCL 4 MG/2ML IJ SOLN
INTRAMUSCULAR | Status: AC
  Filled 2018-10-24: qty 2

## 2018-10-24 MED ORDER — HYDROCODONE-ACETAMINOPHEN 7.5-325 MG PO TABS
1.0000 | ORAL_TABLET | ORAL | Status: DC | PRN
  Administered 2018-10-24 – 2018-11-12 (×18): 1 via ORAL
  Filled 2018-10-24 (×19): qty 1

## 2018-10-24 NOTE — Procedures (Signed)
Tracheostomy Change Note  Patient Details:   Name: Andrea [name] DOB: [birth year] MRN: [id number]    Airway Documentation:  Routine trach tube change done per order. Placed a #4 Shiley cuffless with obturator without complications. Positive color change on ETCO2. Patient with no s/s of distress noted. RN aware.   Evaluation  O2 sats: stable throughout Complications: No apparent complications Patient did tolerate procedure well. Bilateral Breath Sounds: Clear, Diminished    Ander Purpura 10/24/2018, 8:55 AM

## 2018-10-24 NOTE — Progress Notes (Signed)
Physical Therapy Treatment Patient Details Name: Andrea [name] MRN: [id number] DOB: 1960/12/04 Today's Date: 10/24/2018    History of Present Illness 56 yo presented to ED with chest pain noted Rt hemiparesis in ED with acute Left paramedian brainstem infarct. Intubated 10/10 for angioplasty, extubated post procedure and reintubated. Repeat MRI with brainstem infarct extension and bil cerebellar infarcts. Peg/trach with return to vent 08/19/2018. 10/22-29 on trach collar, return to vent 10/29. 10/28 I & D of abdominal wall abscess with placement of penrose drain and G-tube, 11/1 repeat ex lap. 11/7 return to trach collar.  PMHx: HTN, DM, CKD    PT Comments    Patient progressing slowly towards goals.  Able to tolerate OOB for 2 hours and get to chair with +2 total A. Sitting balance not assessed this session or supine to sit due to goal for OOB.  Will continue skilled PT to progress mobility as per goals and continue to recommend SNF for rehab at d/c.   Follow Up Recommendations  SNF;Supervision/Assistance - 24 hour     Equipment Recommendations  Hospital bed;Wheelchair (measurements PT)    Recommendations for Other Services       Precautions / Restrictions Precautions Precautions: Fall Precaution Comments: Peg/trach    Mobility  Bed Mobility Overal bed mobility: Needs Assistance Bed Mobility: Rolling Rolling: Total assist;+2 for physical assistance         General bed mobility comments: rolling to place lift pad  Transfers Overall transfer level: Needs assistance               General transfer comment: lifted OOB to chair via maximove  Ambulation/Gait                 Stairs             Wheelchair Mobility    Modified Rankin (Stroke Patients Only) Modified Rankin (Stroke Patients Only) Pre-Morbid Rankin Score: No symptoms Modified Rankin: Severe disability     Balance       Sitting balance - Comments: NT       Standing balance  comment: NT                            Cognition Arousal/Alertness: Awake/alert Behavior During Therapy: WFL for tasks assessed/performed Overall Cognitive Status: Impaired/Different from baseline Area of Impairment: Attention;Memory;Safety/judgement;Awareness                 Orientation Level: Disoriented to;Time;Situation Current Attention Level: Selective Memory: Decreased short-term memory Following Commands: Follows one step commands consistently     Problem Solving: Slow processing General Comments: not remembering why she has "all this stuff on her" today, reoriented x 2      Exercises General Exercises - Upper Extremity Shoulder Flexion: AAROM;Left;PROM;Right;10 reps;Supine Elbow Flexion: PROM;Right;AAROM;Left;10 reps;Supine Digit Composite Flexion: PROM;Right;AAROM;Left;10 reps;Supine General Exercises - Lower Extremity Ankle Circles/Pumps: AAROM;10 reps;Left;PROM;Right;Supine(x 2 sets "cause it feels good") Heel Slides: AAROM;10 reps;Left;PROM;Right    General Comments        Pertinent Vitals/Pain Pain Assessment: Faces Faces Pain Scale: Hurts little more Pain Location: neck Pain Intervention(s): Monitored during session;Repositioned    Home Living                      Prior Function            PT Goals (current goals can now be found in the care plan section) Acute Rehab PT Goals Patient Stated Goal:  to get all this stuff off me PT Goal Formulation: With patient Time For Goal Achievement: 11/07/18 Potential to Achieve Goals: Fair Progress towards PT goals: Progressing toward goals(goals updated/renewed this session)    Frequency    Min 2X/week      PT Plan Current plan remains appropriate    Co-evaluation              AM-PAC PT "6 Clicks" Mobility   Outcome Measure  Help needed turning from your back to your side while in a flat bed without using bedrails?: Total Help needed moving from lying on your back  to sitting on the side of a flat bed without using bedrails?: Total Help needed moving to and from a bed to a chair (including a wheelchair)?: Total Help needed standing up from a chair using your arms (e.g., wheelchair or bedside chair)?: Total Help needed to walk in hospital room?: Total Help needed climbing 3-5 steps with a railing? : Total 6 Click Score: 6    End of Session Equipment Utilized During Treatment: Oxygen Activity Tolerance: Patient tolerated treatment well Patient left: in chair;with call bell/phone within reach;with chair alarm set;Other (comment) Nurse Communication: Mobility status;Need for lift equipment PT Visit Diagnosis: Other abnormalities of gait and mobility (R26.89);Muscle weakness (generalized) (M62.81);Other symptoms and signs involving the nervous system (R29.898);Hemiplegia and hemiparesis Hemiplegia - Right/Left: Right Hemiplegia - dominant/non-dominant: Dominant Hemiplegia - caused by: Cerebral infarction     Time: 1435-1520 PT Time Calculation (min) (ACUTE ONLY): 45 min  Charges:  $Therapeutic Exercise: 23-37 mins $Therapeutic Activity: 8-22 mins                     Magda Kiel, [place] [facility] [phone] 10/24/2018    Reginia Naas 10/24/2018, 3:48 PM

## 2018-10-24 NOTE — Progress Notes (Signed)
Nutrition Follow-up  DOCUMENTATION CODES:   Morbid obesity  INTERVENTION:  Let pt attempt to eat at meals, if po intake at a meal is <50% then provide a bolus tube feed via PEG using Vital AF 1.2 formula at volume of 237 ml up to TID.   Additionally provide a bolus feed HS daily.   Provide30 ml Prostat(or equivalent)TIDper tube.   Continue free water flushes of 200 ml QID between bolus feeds. (MD to adjust as appropriate)  If all four bolus tube feeds are given, regimen to provide 1438 kcal (90% of kcal needs), 116 grams of protein (97% of protein needs), and 1568 ml water.   ProvideEnsure Enlive po BID(thickened to nectar thick liquids), each supplement provides 350 kcal and 20 grams of protein  Encourage PO intake.  NUTRITION DIAGNOSIS:   Inadequate oral intake related to inability to eat as evidenced by NPO status; diet advanced; progressing  GOAL:   Patient will meet greater than or equal to 90% of their needs; met with TF  MONITOR:   PO intake, Supplement acceptance, Labs, Skin, I & O's, Weight trends, TF tolerance  REASON FOR ASSESSMENT:   Consult Enteral/tube feeding initiation and management  ASSESSMENT:   Andrea [name] is a 58 yo female with PMH of type 2 diabetes, HTN, CKD III, obesity, and anemia admitted for chest pain. Code Stroke called 10/10 in AM. 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 Intubated 10/10 in ICU.  10/21 trach and PEG.11/1 ex lap, I&D. No plans for decannulation as pt with weak cough.   Pt continues on a dysphagia 1 diet with nectar thick liquids. Pt reports only taking a couple of bites of food at meals, thus has been receiving her bolus tube feeds after po attempt. Pt does report abdominal pains which worsens after medication administration. RN aware and medication for abdominal pains/nausea to be given. RD to continue with current tube feeding orders to aid in adequate nutrition as po intake has been poor. Pt continues to await  SNF placement. Labs and medications reviewed.   Diet Order:   Diet Order            DIET - DYS 1 Room service appropriate? Yes; Fluid consistency: Nectar Thick  Diet effective now              EDUCATION NEEDS:   Not appropriate for education at this time  Skin:  Skin Assessment: Skin Integrity Issues: Skin Integrity Issues:: Incisions Stage II: healed Incisions: abdomen  Last BM:  12/19  Height:   Ht Readings from Last 1 Encounters:  09/03/18 5' 4" (1.626 m)    Weight:   Wt Readings from Last 1 Encounters:  10/23/18 121 kg    Ideal Body Weight:  54.5 kg  BMI:  Body mass index is 45.79 kg/m.  Estimated Nutritional Needs:   Kcal:  1600-1800  Protein:  120-135 grams  Fluid:  > 1.6 L    Andrea Parker, MS, RD, LDN Pager # [phone] After hours/ weekend pager # [phone]

## 2018-10-24 NOTE — Progress Notes (Signed)
STROKE TEAM PROGRESS NOTE   SUBJECTIVE (INTERVAL HISTORY) No family members present. The pt C/O headaches sometimes relieved by tylenol but would like something stronger. Also notes leg numbness after taking one of her meds but she is not sure which one.   OBJECTIVE Vitals:   10/23/18 2113 10/23/18 2339 10/23/18 2353 10/24/18 0300  BP:   139/68 (!) 146/79  Pulse: 74 71    Resp: 20 20 20 20   Temp:   98.3 F (36.8 C) 98.6 F (37 C)  TempSrc:   Oral Oral  SpO2: 97% 98%    Weight:      Height:        CBC:  Recent Labs  Lab 10/19/18 1219 10/20/18 1100  WBC 9.7 10.1  HGB 9.9* 10.3*  HCT 33.6* 35.4*  MCV 85.1 86.1  PLT 492* 474*    Basic Metabolic Panel:  Recent Labs  Lab 10/19/18 1219 10/20/18 1100  NA 140 140  K 4.0 3.9  CL 106 107  CO2 21* 24  GLUCOSE 137* 131*  BUN 31* 29*  CREATININE 0.67 0.67  CALCIUM 11.5* 11.5*    IMAGING No results found.    PHYSICAL EXAM  General -  Obese middle-aged African-American lady, on trach collar with speaking valve, not in distress. Her abd is soft, nondistended and nontender. PEG tube in place.  Cardiovascular - regular rate and rhythm Neurological Exam - on trach collar, eyes open, awake, alert. No ptosis or EOMI, denies diplopia.  PERRL. Bilateral facial weakness, tongue protrusion weak. Able to wiggle left hand fingers and left foot toes. 2-/5 LLE proximal and 2/5 distally and 2/5 LUE. Follows commands and good eye contact.  Speech is moderate dysarthric with speaking valve. No aphasia. Sensation intact to touch, cold. gait not tested.    ASSESSMENT/PLAN Andrea [name] is a 57 y.o. female with history of difficult to control hypertension, insulin-dependent diabetes, obesity, hypothyroidism status post ablation presenting with chest discomfort, right-sided weakness, slurred speech and right facial droop. She did not receive IV t-PA due to late presentation. S/P stent assisted angioplasty of distal basilar  artery.  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 Worsening symptoms with extension of pontine/medullary infarcts with new small bilateral cerebellar infarcts without evidence of stent re-stenosis or occlusion  Remains stable  VTE prophylaxis - heparin subq  on aspirin 81 mg daily PTA. Now on Brilinta 90 mg bid and ASA 81 mg daily.   Therapy recommendations:  SNF  Disposition:  SNF.   Medically ready for d/c  D/c paperwork completed 10/14/18  Abnormal uterine bleeding, stable  Has been following with GYN  Was on megace in the past  CT abd/pelvis 09/03/18 - enlarged fibroid uterus  continue megace 80mg  bid  Continue ASA and brilinta for now  OBGYN recs appreciated  Received prmarin 25mg  IV once - vaginal bleeding much improved  Anemia, resolved  Hb 6.8->PRBC->7.1--->10.3  Likely due to iron deficiency, heavy periods and acute uterine bleeding and abdominal wall surgeries  Iron panel showed iron deficiency  On iron solutions  PRBC transfusion 1 U 09/21/2018 and 2U 09/07/18 and 2U 09/21/18  Intracranial stenosis  BA mid to distal severe stenosis s/p BA stenting  Left PCA severe stenosis, R PCA moderate stenosis  Left MCA moderate stenosis  Uncontrolled stroke risk factors with HLD, HTN, DM  Non compliance with meds at home  Respiratory failure  S/p trach 09/01/2018  Able to speak with Andrea Doom  [name] valve although dysarthric  Per CCM Not a candidate for decannulation due to weak cough, and may not be for some time if at all  Maintain trach collar  Maintain current trach type and size  CCM to continue to follow  Hypertension  Stable so far  BP goal 140-170s  On Coreg 25 bid, amlodipine and hydralazine  Hyperlipidemia  Lipid lowering medication PTA:  Lipitor 20 mg daily  LDL 174, goal < 70  Now on Lipitor 80 mg daily  Continue statin at discharge  Diabetes  Glucoses  100-120s  HgbA1c  11.4, goal < 7.0  Lantus 15U bid   Basal NovoLog to 3U 4 times a day with Vital AF feeding  SSI  QAC+QHS  CBG monitoring QAC+QHS  Hypernatremia -> hyponatremia-> hypernatremia->normal  Na 143->145->143->139->140  On vital AF 4 times a day  On free water 150cc Q4h. Consider decreasing frequency of administration.  Dysphagia s/p PEG complicated by abdominal wall abscess  PEG done 08/14/2018  Removed and replaced dislodged PEG 08/17/2018  CT showed Abdominal wall abscess - s/p open surgical drainage 08/30/2018 persistent foul odor discharge. Reexploration 09/29/2018  Wound debrided appropriately and sutures removed.  No evisceration  On vital AF 4 times a day with supplement  Trauma surgery got sutures out of PEG last week  Now passed swallow, on dysphagia 1 diet with nectar thick liquid - eating < 50% per pt  Atypical chest pain, resolved  sternal chest pain 10/19/18 after bouts of coughing.   Tele no changes.   Troponin < 0.03.   CXR negative.   Electrolytes WNL  Chest pain since resolved.   Other Stroke Risk Factors  Morbid Obesity, Body mass index is 45.79 kg/m., recommend weight loss, diet and exercise as appropriate   Other Active Problems  Thrombocytosis - likely due to anemia, [phone]  Oral thrush on nystatin mouthwash.  Frequent mouth care  Reported constipation. Continued daily BMs. bisacodyl prn ordered.   Headaches - pt has hydrocodone ordered.  Leg numbness after medications - ? Baclofen - consider decreasing dose.  Afebrile  Nurse called - pt having nausea -> prn Zofran   Lowry Ram [facility] Pager [phone] 10/24/2018, 10:37 AM   Hospital day # 52

## 2018-10-24 NOTE — Social Work (Signed)
CSW has followed up with both [facility] and [facility], neither has responded today.  CSW continuing to follow for support with disposition. Westley Hummer, MSW, [place] [facility] [phone]

## 2018-10-25 LAB — GLUCOSE, CAPILLARY
Glucose-Capillary: 124 mg/dL — ABNORMAL HIGH (ref 70–99)
Glucose-Capillary: 134 mg/dL — ABNORMAL HIGH (ref 70–99)
Glucose-Capillary: 93 mg/dL (ref 70–99)
Glucose-Capillary: 107 mg/dL — ABNORMAL HIGH (ref 70–99)

## 2018-10-25 NOTE — Progress Notes (Signed)
STROKE TEAM PROGRESS NOTE   SUBJECTIVE (INTERVAL HISTORY) No major changes.  Feels a bit anxious.  BP relatively well controlled.  Glucose improving.    OBJECTIVE Vitals:   10/25/18 0354 10/25/18 0413 10/25/18 0728 10/25/18 0901  BP:  (!) 159/83 (!) 149/76   Pulse: 74 69 74   Resp: (!) 21 (!) 25 15   Temp:  99.3 F (37.4 C) 99.3 F (37.4 C)   TempSrc:  Oral Oral   SpO2: 100% 94% 99% 99%  Weight:  120 kg    Height:        CBC:  Recent Labs  Lab 10/19/18 1219 10/20/18 1100  WBC 9.7 10.1  HGB 9.9* 10.3*  HCT 33.6* 35.4*  MCV 85.1 86.1  PLT 492* 474*    Basic Metabolic Panel:  Recent Labs  Lab 10/19/18 1219 10/20/18 1100  NA 140 140  K 4.0 3.9  CL 106 107  CO2 21* 24  GLUCOSE 137* 131*  BUN 31* 29*  CREATININE 0.67 0.67  CALCIUM 11.5* 11.5*    IMAGING No results found.    PHYSICAL EXAM  General -  Obese middle-aged African-American lady, on trach collar with speaking valve, not in distress. Her abd is soft, nondistended and nontender. PEG tube in place.  Cardiovascular - regular rate and rhythm Neurological Exam - on trach collar, eyes open, awake, alert. No ptosis or EOMI, denies diplopia.  PERRL. Bilateral facial weakness, tongue protrusion weak. Able to wiggle left hand fingers and left foot toes. 2-/5 LLE proximal and 2/5 distally and 2/5 LUE. Follows commands and good eye contact.  Speech is moderate dysarthric with speaking valve. No aphasia. Sensation intact to touch, cold. gait not tested.    ASSESSMENT/PLAN Andrea [name] is a 57 y.o. female with history of difficult to control hypertension, insulin-dependent diabetes, obesity, hypothyroidism status post ablation presenting with chest discomfort, right-sided weakness, slurred speech and right facial droop. She did not receive IV t-PA due to late presentation. S/P stent assisted angioplasty of distal basilar artery.  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 Worsening symptoms with extension of pontine/medullary infarcts with new small bilateral cerebellar infarcts without evidence of stent re-stenosis or occlusion  Remains stable  VTE prophylaxis - heparin subq  on aspirin 81 mg daily PTA. Now on Brilinta 90 mg bid and ASA 81 mg daily.   Therapy recommendations:  SNF  Disposition:  SNF.   Medically ready for d/c  D/c paperwork completed 10/14/18  Abnormal uterine bleeding, stable  Has been following with GYN  Was on megace in the past  CT abd/pelvis 09/03/18 - enlarged fibroid uterus  continue megace 80mg  bid  Continue ASA and brilinta for now  OBGYN recs appreciated  Received prmarin 25mg  IV once - vaginal bleeding much improved  Anemia, resolved  Hb 6.8->PRBC->7.1--->10.3  Likely due to iron deficiency, heavy periods and acute uterine bleeding and abdominal wall surgeries  Iron panel showed iron deficiency  On iron solutions  PRBC transfusion 1 U 10/04/2018 and 2U 09/07/18 and 2U 09/21/18  Intracranial stenosis  BA mid to distal severe stenosis s/p BA stenting  Left PCA severe stenosis, R PCA moderate stenosis  Left MCA moderate stenosis  Uncontrolled stroke risk factors with HLD, HTN, DM  Non compliance with meds at home  Respiratory failure  S/p trach 08/10/2018  Able to speak with [name] valve although dysarthric  Per CCM Not a candidate for decannulation due  to weak cough, and may not be for some time if at all  Maintain trach collar  Maintain current trach type and size  CCM to continue to follow  Hypertension  Stable so far  BP goal 140-170s  On Coreg 25 bid, amlodipine and hydralazine  Hyperlipidemia  Lipid lowering medication PTA:  Lipitor 20 mg daily  LDL 174, goal < 70  Now on Lipitor 80 mg daily  Continue statin at discharge  Diabetes  Glucoses  100-120s  HgbA1c 11.4, goal < 7.0  Lantus 15U bid   Basal NovoLog to 3U 4 times a day with Vital AF  feeding  SSI  QAC+QHS  CBG monitoring QAC+QHS  Hypernatremia -> hyponatremia-> hypernatremia->normal  Na 143->145->143->139->140  On vital AF 4 times a day  On free water 150cc Q4h. Consider decreasing frequency of administration.  Dysphagia s/p PEG complicated by abdominal wall abscess  PEG done 08/06/2018  Removed and replaced dislodged PEG 08/24/2018  CT showed Abdominal wall abscess - s/p open surgical drainage 08/27/2018 persistent foul odor discharge. Reexploration 09/09/2018  Wound debrided appropriately and sutures removed.  No evisceration  On vital AF 4 times a day with supplement  Trauma surgery got sutures out of PEG last week  Now passed swallow, on dysphagia 1 diet with nectar thick liquid - eating < 50% per pt  Atypical chest pain, resolved  sternal chest pain 10/19/18 after bouts of coughing.   Tele no changes.   Troponin < 0.03.   CXR negative.   Electrolytes WNL  Chest pain since resolved.   Other Stroke Risk Factors  Morbid Obesity, Body mass index is 45.41 kg/m., recommend weight loss, diet and exercise as appropriate   Other Active Problems  Thrombocytosis - likely due to anemia, [phone]  Oral thrush on nystatin mouthwash.  Frequent mouth care  Reported constipation. Continued daily BMs. bisacodyl prn ordered.   Headaches - pt has hydrocodone ordered.  Leg numbness after medications - ? Baclofen 10 mg TID - consider decreasing dose.  Afebrile  Nurse called - pt having nausea -> prn [facility] day # 72    A/P:  Pontine infarct with bilateral cerebellar infarcts s/p basilar artery stenting.  BP is relatively well controlled.  Glucose is improving.  She is on statin.  She is on dual antiplatelet due to stent.  Awaiting SNF placement.  Rogue Jury, MS, MD

## 2018-10-25 NOTE — Plan of Care (Signed)
  Problem: Education: Goal: Knowledge of General Education information will improve Description Including pain rating scale, medication(s)/side effects and non-pharmacologic comfort measures Outcome: Progressing Note:  POC reviewed with pt. and seem to understand pretty good.

## 2018-10-26 LAB — GLUCOSE, CAPILLARY
Glucose-Capillary: 100 mg/dL — ABNORMAL HIGH (ref 70–99)
Glucose-Capillary: 119 mg/dL — ABNORMAL HIGH (ref 70–99)
Glucose-Capillary: 115 mg/dL — ABNORMAL HIGH (ref 70–99)
Glucose-Capillary: 113 mg/dL — ABNORMAL HIGH (ref 70–99)

## 2018-10-26 NOTE — Progress Notes (Signed)
STROKE TEAM PROGRESS NOTE   SUBJECTIVE (INTERVAL HISTORY) No major changes.  BP is well controlled.  Glucose is well controlled.  Tolerating statin well.  OBJECTIVE Vitals:   10/26/18 0845 10/26/18 0900 10/26/18 1127 10/26/18 1204  BP: (!) 148/79   128/70  Pulse: 83 66 70 60  Resp:   20 (!) 21  Temp:    99.5 F (37.5 C)  TempSrc:    Oral  SpO2: 100% 100% 100% 99%  Weight:      Height:        CBC:  Recent Labs  Lab 10/20/18 1100  WBC 10.1  HGB 10.3*  HCT 35.4*  MCV 86.1  PLT 474*    Basic Metabolic Panel:  Recent Labs  Lab 10/20/18 1100  NA 140  K 3.9  CL 107  CO2 24  GLUCOSE 131*  BUN 29*  CREATININE 0.67  CALCIUM 11.5*    IMAGING No results found.    PHYSICAL EXAM  General -  Obese middle-aged African-American lady, on trach collar with speaking valve, not in distress. Her abd is soft, nondistended and nontender. PEG tube in place.  Cardiovascular - regular rate and rhythm Neurological Exam - on trach collar, eyes open, awake, alert. No ptosis or EOMI, denies diplopia.  PERRL. Bilateral facial weakness, tongue protrusion weak. Able to wiggle left hand fingers and left foot toes. 2-/5 LLE proximal and 2/5 distally and 2/5 LUE. Follows commands and good eye contact.  Speech is moderate dysarthric with speaking valve. No aphasia. Sensation intact to touch, cold. gait not tested.    ASSESSMENT/PLAN Ms. KIMARIE COOR is a 57 y.o. female with history of difficult to control hypertension, insulin-dependent diabetes, obesity, hypothyroidism status post ablation presenting with chest discomfort, right-sided weakness, slurred speech and right facial droop. She did not receive IV t-PA due to late presentation. S/P stent assisted angioplasty of distal basilar artery.  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 Worsening symptoms with extension of pontine/medullary infarcts with new small bilateral cerebellar infarcts  without evidence of stent re-stenosis or occlusion  Remains stable  VTE prophylaxis - heparin subq  on aspirin 81 mg daily PTA. Now on Brilinta 90 mg bid and ASA 81 mg daily.   Therapy recommendations:  SNF  Disposition:  SNF.   Medically ready for d/c  D/c paperwork completed 10/14/18  Abnormal uterine bleeding, stable  Has been following with GYN  Was on megace in the past  CT abd/pelvis 09/03/18 - enlarged fibroid uterus  continue megace 80mg  bid  Continue ASA and brilinta for now  OBGYN recs appreciated  Received prmarin 25mg  IV once - vaginal bleeding much improved  Anemia, resolved  Hb 6.8->PRBC->7.1--->10.3  Likely due to iron deficiency, heavy periods and acute uterine bleeding and abdominal wall surgeries  Iron panel showed iron deficiency  On iron solutions  PRBC transfusion 1 U 09/17/2018 and 2U 09/07/18 and 2U 09/21/18  Intracranial stenosis  BA mid to distal severe stenosis s/p BA stenting  Left PCA severe stenosis, R PCA moderate stenosis  Left MCA moderate stenosis  Uncontrolled stroke risk factors with HLD, HTN, DM  Non compliance with meds at home  Respiratory failure  S/p trach 08/22/2018  Able to speak with [name] valve although dysarthric  Per CCM Not a candidate for decannulation due to weak cough, and may not be for some time if at all  Maintain trach collar  Maintain current trach type and size  CCM to continue to follow  Hypertension  Stable so far  BP goal 140-170s  On Coreg 25 bid, amlodipine and hydralazine  Hyperlipidemia  Lipid lowering medication PTA:  Lipitor 20 mg daily  LDL 174, goal < 70  Now on Lipitor 80 mg daily  Continue statin at discharge  Diabetes  Glucoses  100-120s  HgbA1c 11.4, goal < 7.0  Lantus 15U bid   Basal NovoLog to 3U 4 times a day with Vital AF feeding  SSI  QAC+QHS  CBG monitoring QAC+QHS  Hypernatremia -> hyponatremia-> hypernatremia->normal  Na  143->145->143->139->140  On vital AF 4 times a day  On free water 150cc Q4h. Consider decreasing frequency of administration.  Dysphagia s/p PEG complicated by abdominal wall abscess  PEG done 08/24/2018  Removed and replaced dislodged PEG 08/31/2018  CT showed Abdominal wall abscess - s/p open surgical drainage 09/03/2018 persistent foul odor discharge. Reexploration 09/24/2018  Wound debrided appropriately and sutures removed.  No evisceration  On vital AF 4 times a day with supplement  Trauma surgery got sutures out of PEG last week  Now passed swallow, on dysphagia 1 diet with nectar thick liquid - eating < 50% per pt  Atypical chest pain, resolved  sternal chest pain 10/19/18 after bouts of coughing.   Tele no changes.   Troponin < 0.03.   CXR negative.   Electrolytes WNL  Chest pain since resolved.   Other Stroke Risk Factors  Morbid Obesity, Body mass index is 45.41 kg/m., recommend weight loss, diet and exercise as appropriate   Other Active Problems  Thrombocytosis - likely due to anemia, [phone]  Oral thrush on nystatin mouthwash.  Frequent mouth care  Reported constipation. Continued daily BMs. bisacodyl prn ordered.   Headaches - pt has hydrocodone ordered.  Leg numbness after medications - ? Baclofen 10 mg TID - consider decreasing dose.  Temp 99.5 -> check CBC in AM  Nurse called - pt having nausea -> prn [facility] day # 20    A/P:  Pontine infarct with bilateral cerebellar infarcts s/p basilar artery stenting.  BP and glucose are well controlled. She is on statin.  She is on dual antiplatelet due to stent.  Awaiting SNF placement.  Rogue Jury, MS, MD

## 2018-10-27 LAB — GLUCOSE, CAPILLARY
Glucose-Capillary: 95 mg/dL (ref 70–99)
Glucose-Capillary: 122 mg/dL — ABNORMAL HIGH (ref 70–99)
Glucose-Capillary: 131 mg/dL — ABNORMAL HIGH (ref 70–99)
Glucose-Capillary: 178 mg/dL — ABNORMAL HIGH (ref 70–99)

## 2018-10-27 LAB — CBC
HEMATOCRIT: 33 % — AB (ref 36.0–46.0)
Hemoglobin: 10.1 g/dL — ABNORMAL LOW (ref 12.0–15.0)
MCH: 26.2 pg (ref 26.0–34.0)
MCHC: 30.6 g/dL (ref 30.0–36.0)
MCV: 85.7 fL (ref 80.0–100.0)
Platelets: 452 10*3/uL — ABNORMAL HIGH (ref 150–400)
RBC: 3.85 MIL/uL — ABNORMAL LOW (ref 3.87–5.11)
RDW: 17 % — ABNORMAL HIGH (ref 11.5–15.5)
WBC: 9.9 10*3/uL (ref 4.0–10.5)
nRBC: 0 % (ref 0.0–0.2)

## 2018-10-27 LAB — BASIC METABOLIC PANEL
Anion gap: 9 (ref 5–15)
BUN: 26 mg/dL — ABNORMAL HIGH (ref 6–20)
CHLORIDE: 105 mmol/L (ref 98–111)
CO2: 23 mmol/L (ref 22–32)
Calcium: 11.5 mg/dL — ABNORMAL HIGH (ref 8.9–10.3)
Creatinine, Ser: 0.65 mg/dL (ref 0.44–1.00)
GFR calc non Af Amer: >60 mL/min (ref >60)
Glucose, Bld: 117 mg/dL — ABNORMAL HIGH (ref 70–99)
Potassium: 4.2 mmol/L (ref 3.5–5.1)
Sodium: 137 mmol/L (ref 135–145)

## 2018-10-27 MED ORDER — BACLOFEN 10 MG PO TABS
5.0000 mg | ORAL_TABLET | Freq: Two times a day (BID) | ORAL | Status: DC
  Administered 2018-10-27 – 2018-11-13 (×34): 5 mg
  Filled 2018-10-27 (×34): qty 1

## 2018-10-27 NOTE — Progress Notes (Addendum)
Occupational Therapy Treatment Patient Details Name: ARITZEL KRUSEMARK MRN: [id number] DOB: 1960/11/19 Today's Date: 10/27/2018    History of present illness 57 yo presented to ED with chest pain noted Rt hemiparesis in ED with acute Left paramedian brainstem infarct. Intubated 10/10 for angioplasty, extubated post procedure and reintubated. Repeat MRI with brainstem infarct extension and bil cerebellar infarcts. Peg/trach with return to vent 08/17/2018. 10/22-29 on trach collar, return to vent 10/29. 10/28 I & D of abdominal wall abscess with placement of penrose drain and G-tube, 11/1 repeat ex lap. 11/7 return to trach collar.  PMHx: HTN, DM, CKD   OT comments  Pt presents supine in bed pleasant and willing to participate in therapy session. Pt requiring hand over hand assist to perform simple grooming and self-feeding task using LUE for task completion. Pt able to perform AAROM throughout L elbow/forearm/wrist/hand given increased time and effort. Feel POC remains appropriate at this time. Will continue to follow acutely to progress pt towards established OT goals.    Follow Up Recommendations  SNF;Supervision/Assistance - 24 hour    Equipment Recommendations  Other (comment)(TBA)          Precautions / Restrictions Precautions Precautions: Fall Precaution Comments: Peg/trach                                                     ADL either performed or assessed with clinical judgement   ADL   Eating/Feeding: Maximal assistance Eating/Feeding Details (indicate cue type and reason): PMSV on while drinking from cup; Able to hold cup with min A to maintain grasp Grooming: Maximal assistance;Wash/dry face Grooming Details (indicate cue type and reason): hand over hand assist using LUE                                                       Cognition Arousal/Alertness: Awake/alert Behavior During Therapy: WFL for tasks  assessed/performed Overall Cognitive Status: Impaired/Different from baseline Area of Impairment: Attention;Memory;Safety/judgement;Awareness                 Orientation Level: Disoriented to;Time Current Attention Level: Selective Memory: Decreased short-term memory Following Commands: Follows one step commands consistently     Problem Solving: Slow processing General Comments: pt requires increased time to recall correct year and month; able to state correct holiday when cued; requires intermittent cues to attend to task (tv on in background but muted)        Exercises General Exercises - Upper Extremity Shoulder Flexion: AAROM;Left;PROM;Right;10 reps;Supine Shoulder Horizontal ABduction: PROM;AROM;Both;10 reps;Supine Shoulder Horizontal ADduction: AAROM;PROM;Both;10 reps;Supine Elbow Flexion: PROM;Right;AAROM;Left;10 reps;Supine Elbow Extension: PROM;Right;AAROM;Left;15 reps;Seated Wrist Flexion: PROM;Right;AAROM;Left;10 reps Wrist Extension: PROM;Right;AAROM;Left;15 reps;Supine Digit Composite Flexion: PROM;Right;AAROM;Left;10 reps;Supine Composite Extension: PROM;Right;AAROM;Left;15 reps;Seated General Exercises - Lower Extremity Ankle Circles/Pumps: AAROM;PROM;Left;Right;10 reps;Supine   Shoulder Instructions       General Comments      Pertinent Vitals/ Pain       Pain Assessment: Faces Faces Pain Scale: Hurts even more Pain Location: bil UEs with certain movements; stomach (reports cramps) Pain Descriptors / Indicators: Cramping;Grimacing;Sore Pain Intervention(s): Monitored during session;Premedicated before session;Limited activity within patient's tolerance  Prior Functioning/Environment              Frequency  Min 2X/week        Progress Toward Goals  OT Goals(current goals can now be found in the care plan section)  Progress towards OT goals: Progressing toward goals  Acute  Rehab OT Goals Patient Stated Goal: less cramping in stomach OT Goal Formulation: With patient/family Time For Goal Achievement: 11/03/18 Potential to Achieve Goals: [place] Discharge plan remains appropriate;Frequency remains appropriate    Co-evaluation                 AM-PAC OT "6 Clicks" Daily Activity     Outcome Measure   Help from another person eating meals?: A Lot Help from another person taking care of personal grooming?: A Lot Help from another person toileting, which includes using toliet, bedpan, or urinal?: Total Help from another person bathing (including washing, rinsing, drying)?: Total Help from another person to put on and taking off regular upper body clothing?: Total Help from another person to put on and taking off regular lower body clothing?: Total 6 Click Score: 8    End of Session    OT Visit Diagnosis: Other abnormalities of gait and mobility (R26.89);Muscle weakness (generalized) (M62.81);Low vision, both eyes (H54.2);Feeding difficulties (R63.3);Other symptoms and signs involving cognitive function;Hemiplegia and hemiparesis Hemiplegia - Right/Left: Right Hemiplegia - dominant/non-dominant: Non-Dominant Hemiplegia - caused by: Nontraumatic intracerebral hemorrhage;Cerebral infarction Pain - part of body: (bil shoulder; R wrist )   Activity Tolerance Patient tolerated treatment well   Patient Left in bed;with call bell/phone within reach   Nurse Communication Mobility status        Time: 8676-1950 OT Time Calculation (min): 33 min  Charges: OT General Charges $OT Visit: 1 Visit OT Treatments $Self Care/Home Management : 8-22 mins $Therapeutic Activity: 8-22 mins  Lou Cal, OT [facility] Pager [phone] Office [phone]   Raymondo Band 10/27/2018, 4:40 PM

## 2018-10-27 NOTE — Progress Notes (Signed)
NAME:  Andrea [name], MRN:  [id number], DOB:  Mar 10, 1961, LOS: 85 ADMISSION DATE:  08/31/2018, CONSULTATION DATE:  08/18/2018 REFERRING MD:  Dr. Rory Percy, CHIEF COMPLAINT:  CP/ Acute CVA  Brief History   59 yoF w/poorly controlled HTN and IDDM presenting with CP and left arm pain w/BP 220/117. Initial CTH neg.  Cardiac workup negative for acute event thus far, cardiology following.  Code stroke initiated for R hemiplegia, dysarthria and right facial droop.  Out of window for TPA.  Requiring cleviprex for BP control.  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  Evolving pontine stroke on MRI. Intubated 10/10 for neuro IR s/p stenting and angioplasty with 80-90% patency.  Extubated post procedure but due to insufficency placed on BiPAP and brought to ICU.  Required reintubation and ultimately trach (10/21 JY).  Peg tube placed 10/21 by CCS.  Taken to OR 10/28 for open gastrostomy and I&D of abdominal wall abscess. 11/1 taken back to OR for repeat ex-lap.     Past Medical History  HTN, IDDM, hypothyroidism s/p ablation  Significant Hospital Events   10/9 present to [facility] ED -transferred to [facility] 10/9 basilar artery stenting 10/9 intubated urgently 6h post procedure for inability to protect airway. 10/10 CTA for declining exam - evolving pontine infarct, stent deemed to be patent 10/14 Brillinta stopped and ASA/ heparin started to in anticipation of tracheostomy on Monday. 10/14 through 10/19: Neuro exam has been stable sodium increasing in spite of free water via tube, providing supportive care awaiting trach 10/20: Sodium down to 151 from 156 clinically looks the same 10/21: Sodium down to 148, sputum culture sent for purulent tracheal secretions, awaiting trach and PEG.  Purulent bronchial secretions from right upper lobe sent for BAL 10/22: Tracheostomy and PEG completed on the 21st tolerated well.  Spiking low-grade fever white cells continued to climb starting empiric  vancomycin and ceftaz.  Sodium improved down to 146.  Blood pressure improving but still not at goal added hydrochlorothiazide.  Resumed Brilinta.  10/23: Sodium normal.  Backing down on free water.  Glycemic control challenging, increased basal NovoLog in addition to Lantus and sliding scale.  Still spiking fever, white blood cell count climbing, sending blood cultures.  Foley catheter being placed for pressure ulcer caused by pure wick device 10/27 Foul smelling dark trach secretions, PEG secretions  10/28 to OR for ex lap due to PEG dislodgement.  PEG replaced 10/29 remains on MV 2/2 tachypnea; family meeting 10/30 no weaning 2/2 to ongoing tachypnea; foul smelling drainage from abd site 10/31 no weaning, abx changed, family meeting 11/1 repeat Ex-lap for I&D of absces 11/1 tolerated > 48h trach collar trial 11/7 on ATC since 11/12 trach changed 11/13 not tolerating PMV 11/15 developed heavy vaginal bleeding. GYN called. Recommended transvag US.  11/16 started on IV estrogen per recs of GYN, also told to cont megace. Vaginal bleeding improving 11/17 copious Trach secretions. Neuro added HT saline nebs got 2 units PRBC for hgb of 7 11/19 seen by SLP. Has made progress. Repeat FEEs ordered  Consults: date of consult/date signed off & final recs:  Cards 10/10 Neurology 10/10 PCCM 10/10 CCS 10/17  Procedures (surgical and bedside):  10/10 Neuro IR >> s/p stent and angioplasty of distal basilar artery  10/10 ETT for procedure 10/10 ETT (reintubated) >> removed 10/21 10/10 Size 6 tracheostomy placed by Dr. Nelda Marseille 10/26 changed to #4 trach cuffless  10/28 Ex lap with replacement of gastrostomy tube due  to dislodgement 11/1 repeat Ex-lap with I&D of abdominal wall abscess  11/12 trach change 12/9 trach change  Significant Diagnostic Tests:  10/10  CTH >>  No acute intracranial abnormalities. Mild white matter changes likely due to small vessel ischemia 10/10 CTH >> No acute abnormality  and no change from earlier today 10/10 CTA head and neck >> Extensive atherosclerotic disease in the basilar with focal critical stenosis in the mid to distal basilar. No definite acute thrombus identified. Severe stenosis left posterior cerebral artery and moderate stenosis right posterior cerebral artery. Mild stenosis left MCA and moderate stenosis left MCA bifurcation. No significant carotid or vertebral artery stenosis in the neck. 10/10 MRI >> evolving pontine infarct. 10/11 ECHO >> normal LVSF with severe LVH. 10/28 CT A / P > dislodged PEG tube outside of the peritoneum and not within the stomach.  Cholelithiasis. No bowel obstruction.  No hydronephrosis.  10/30 CT abd/pelvis >> Mild bilateral atelectasis.  Interval placement of new gastrostomy tube with tip and balloon within gastric lumen. Surgical drain is seen in the soft tissues in previous gastrostomy site. Small amount of gas and stranding is seen in the peritoneal fat anterior and inferior to the stomach which most likely is related to gastrostomy placement. Probable surgical wound seen in right upper quadrant of the anterior abdominal wall.  8.2 x 2.3 cm gas and fluid collection is seen in the anterior abdominal wall which is unchanged compared to prior exam. Possible abscess cannot be excluded.  Enlarged fibroid uterus.  11/14 B LE venous dopplers  >>  Micro Data:  10/10 MRSA PCR >> negative 10/21 Sputum culture >> nl resp flora  10/23 Blood cultures x 2 >> negative 10/26 Blood cultures x 2 >> neg 10/28 MRSA PCR >> neg 10/27 Sputum culture >> normal flora 11/1 Abdominal abscess> kelbsiella, proteus, susceptible to imipenem, amp-sulbactam, ESBL  Antimicrobials:  10/10 cefazolin preop 10/22 Vancomycin >> 10/30 10/22 Ceftaz >> 10/31 10/31 ceftriaxone >> 11/4 10/31 flagyl >> 11/4 11/4 meropenem >> 11/9  Subjective/interval hx :  No c/o  Wears PMV with staff supervision  Awaiting LTAC placement   Objective   Blood  pressure (!) 108/54, pulse 69, temperature 98.7 F (37.1 C), temperature source Oral, resp. rate 19, height _0  (1.626 m), weight 120 kg, last menstrual period 07/26/2018, SpO2 100 %.    FiO2 (%):  [28 %] 28 %   Intake/Output Summary (Last 24 hours) at 10/27/2018 1133 Last data filed at 10/27/2018 0800 Gross per 24 hour  Intake 837 ml  Output 2500 ml  Net -1663 ml   Filed Weights   10/21/18 0356 10/23/18 0609 10/25/18 0413  Weight: 114 kg 121 kg 120 kg   Examination:  Gen:    pleasant, chronically ill appearing female, NAD  HEENT:  [place]/AT, PERRL, EOM-I and MMM Neck:      #4 cuffless trach in place, c/d  Lungs:    resps even non labored on ATC with PMV (placed by me)  CV:         s1s2 rrr Abd:      Soft, NT, ND and +BS Ext:    -edema and -tenderness Skin:       Intact Neuro:    Awake, oriented, follows commands.   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  HTN Neuropathic pain  Uterine bleeding Acute blood loss anemia  Wound care s/p I&D of abd wall abscess DM hypothyroidism  Current Pulmonary specific problem list    Tracheostomy dependence d/t ineffective airway clearance & poor cough mechanics after CVA - s/p JY trach 10/10 - Titrate O2 for sat of 88-92% - Weak cough mechanics persist Plan ATC as tol  No plans for decannulation at this time - may be able to consider in the future if she rehabs well  Continue #4 cuffless  PMV per speech recs  PRN BD  Continue trach care  F/u with Laurey Arrow in trach clinic on d/c if she remains local   Dysphasia following CVA Plan Modified diet per speech  SLP following, appreciate input    PCCM will continue to  follow weekly for trach care. Call sooner if needed.   Nickolas Madrid, NP 10/27/2018  11:33 AM Pager: [phone] or [phone]

## 2018-10-27 NOTE — Progress Notes (Addendum)
STROKE TEAM PROGRESS NOTE   SUBJECTIVE (INTERVAL HISTORY) Physical therapy at bedside.  Patient is alert and talkative.  She says she feels well today.  No significant events overnight.  Patient has no new complaints.  OBJECTIVE Vitals:   10/27/18 0900 10/27/18 1115 10/27/18 1151 10/27/18 1443  BP: (!) 108/54 (!) 108/54 (!) 161/83 (!) 154/77  Pulse: 71 69    Resp: 18 19 18    Temp: 98.7 F (37.1 C)  97.9 F (36.6 C)   TempSrc: Oral  Oral   SpO2:  100%    Weight:      Height:        CBC:  Recent Labs  Lab 10/27/18 0422  WBC 9.9  HGB 10.1*  HCT 33.0*  MCV 85.7  PLT 452*    Basic Metabolic Panel:  Recent Labs  Lab 10/27/18 0422  NA 137  K 4.2  CL 105  CO2 23  GLUCOSE 117*  BUN 26*  CREATININE 0.65  CALCIUM 11.5*    IMAGING No results found.    PHYSICAL EXAM  This is an obese African-American female, on trach and PEG.  Patient has a speaking valve and speech is slightly dysarthric possibly due to poor dentition but speech is spontaneous spontaneous and patient comprehends conversation and answers appropriately.  Patient follows commands, answers questions appropriately, is oriented to name and month and year,.  She is not in distress.  Pupils are equally round and reactive, extraocular muscles are intact and she can see in all quadrants, weak smile and tongue protrusion however intact, patient can smile and face appears symmetric, trigeminal sensation intact, patient is able to minimally move left arm and left leg possibly 2 out of 5.  Decreased tone left arm and left leg.  Right arm and leg flaccid paralysis.  Patient reports she can feel sensation in all limbs.  ASSESSMENT/PLAN Ms. KAELY HOLLAN is a 57 y.o. female with history of difficult to control hypertension, insulin-dependent diabetes, obesity, hypothyroidism status post ablation presenting with chest discomfort, right-sided weakness, slurred speech and right facial droop. She did not receive IV t-PA due to  late presentation. S/P stent assisted angioplasty of distal basilar artery.  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 Worsening symptoms with extension of pontine/medullary infarcts with new small bilateral cerebellar infarcts without evidence of stent re-stenosis or occlusion  Remains stable  VTE prophylaxis - heparin subq  on aspirin 81 mg daily PTA. Now on Brilinta 90 mg bid and ASA 81 mg daily.   Therapy recommendations:  SNF  Disposition:  SNF.   Medically ready for d/c  D/c paperwork completed 10/14/18  Abnormal uterine bleeding, stable  Has been following with GYN  Was on megace in the past  CT abd/pelvis 09/03/18 - enlarged fibroid uterus  continue megace 80mg  bid  Continue ASA and brilinta for now  OBGYN recs appreciated  Received prmarin 25mg  IV once - vaginal bleeding much improved  Anemia, resolved  Hb 6.8->PRBC->7.1--->10.3  Likely due to iron deficiency, heavy periods and acute uterine bleeding and abdominal wall surgeries  Iron panel showed iron deficiency  On iron solutions  PRBC transfusion 1 U 09/13/2018 and 2U 09/07/18 and 2U 09/21/18  Intracranial stenosis  BA mid to distal severe stenosis s/p BA stenting  Left PCA severe stenosis, R PCA moderate stenosis  Left MCA moderate stenosis  Uncontrolled stroke risk factors with HLD, HTN, DM  Non compliance with meds at home  Respiratory  failure  S/p trach 08/18/2018  Able to speak with [name] valve although dysarthric  Per CCM will continue trach d/t weak cough, secretions, plugging  Maintain trach collar  Maintain current trach type and size  CCM to continue to follow  Follow up at d.c in the trach clinic  Hypertension  Stable so far  BP goal 140-170s  On Coreg 25 bid, amlodipine and hydralazine  Hyperlipidemia  Lipid lowering medication PTA:  Lipitor 20 mg daily  LDL 174, goal < 70  Now on Lipitor 80 mg  daily  Continue statin at discharge  Diabetes  Glucoses  100-120s  HgbA1c 11.4, goal < 7.0  Lantus 15U bid   Basal NovoLog to 3U 4 times a day with Vital AF feeding  SSI  QAC+QHS  CBG monitoring QAC+QHS  Hypernatremia -> hyponatremia-> hypernatremia->normal  Na 143->145->143->139->140  On vital AF 4 times a day  On free water 150cc Q4h. Consider decreasing frequency of administration.  Dysphagia s/p PEG complicated by abdominal wall abscess  PEG done 08/06/2018  Removed and replaced dislodged PEG 08/31/2018  CT showed Abdominal wall abscess - s/p open surgical drainage 08/26/2018 persistent foul odor discharge. Reexploration 09/25/2018  Wound debrided appropriately and sutures removed.  No evisceration  On vital AF 4 times a day with supplement  Trauma surgery got sutures out of PEG last week  On on dysphagia 1 diet with nectar thick liquid - eating  Atypical chest pain, resolved  sternal chest pain 10/19/18 after bouts of coughing.   Tele no changes.   Troponin < 0.03.   CXR negative.   Electrolytes WNL  Chest pain since resolved.   Other Stroke Risk Factors  Morbid Obesity, Body mass index is 45.41 kg/m., recommend weight loss, diet and exercise as appropriate   Other Active Problems  Thrombocytosis - likely due to anemia, [phone]  Oral thrush on nystatin mouthwash.  Frequent mouth care  Reported constipation. Continued daily BMs. bisacodyl prn ordered.   Headaches - pt has hydrocodone ordered.  Leg numbness after medications - ? Baclofen 10 mg TID - will decrease to 5 mg bid  Temp 99.5 -> check CBC - wbc stable - ok in AM  Nurse called - pt having nausea -> prn [facility] day # 62   This is an unfortunate 57 year old obese African-American female with multiple risk factors including hypertension, insulin-dependent diabetes, obesity, hyperlipidemia, who presented with right-sided weakness, right facial droop, dysarthria she did not  receive IV TPA due to late presentation.  Imaging showed a paramedian left pontine infarct due to basilar artery stenosis status post is status post basilar stenting.  Personally examined patient and images, and have participated in and made any corrections needed to history, physical, neuro exam,assessment and plan as stated above.  I have personally obtained the history, evaluated lab date, reviewed imaging studies and agree with radiology interpretations.    Sarina Ill, MD Stroke Neurology  A total of 25 minutes was spent on this patient, reviewing labs, imaging, diagnosis and different diagnostic and therapeutic options, counseling and coordination of care, risks and benefits of management, compliance, or risk factor reduction and education.

## 2018-10-27 NOTE — Progress Notes (Addendum)
Called to see the patient for abdominal pain. She is a poor historian and is unable to state where the pain is other than "my belly", but with palpation it is elicited along her lower abdomen RLQ being worse than LLQ. She states that it gets worse when her room is cold and better with "medicine". She states that the abdominal pain is a 6/10 and has been ongoing for about 2 days. She denies any change in the pain with bowel movements.   Abdominal exam: Bowel sounds are present in all 4 quadrants and sound slightly hyperactive. Tender to palpation RLQ > LLQ and central lower abdomen. Nondistended. Left PEG site C/D/I with slight TTP. Midline wound site from abscess drainage is reddish-pink with some green discharge but no frank odor.   Neurological exam: Alert, conversant, able to answer questions and follow commands. Tracks examiner normally with conjugate EOM. Face symmetric.    Assessment/Plan: 57 year old female admitted for left pontine infarct extension, now complaining of a 2 day history of 6/10 abdominal pain that waxes and wanes.  1. Exam with TTP RLQ > LLQ but no other findings to suggest a focal process. No abdominal distension and bowel sounds are present. However, she has a history of recent abdominal abscess drainage and spread to a new site is on DDx. Also with a history of fibroids and uterine bleeding which could also contribute.  2. S/P PEG tube placement 2 days ago.  3. The General Surgery on-call team has been contacted for assistance in further evaluation and management of the patient's new onset abdominal pain.   Electronically signed: Dr. Kerney Elbe

## 2018-10-27 NOTE — Progress Notes (Signed)
Patient ID: Andrea [name], female   DOB: 1960-12-21, 57 y.o.   MRN: [id number] Called to see patient for abdominal pain.  She is known to our service status post PEG tube placement on 10/21.  It became dislodged requiring exploratory laparotomy and debridement of abdominal wall on 10/28 and 11/1.  She complains of right-sided abdominal pain.  Since we saw her last she has progressed to a dysphagia 1 diet.  On exam, afebrile, WBC 9.9, her G-tube is in place.  Her midline wound is clean.  She has some moderate tenderness on the right side of her abdomen, no peritonitis.  Due to her complex history, I will evaluate things further with a CT scan of the abdomen and pelvis.  We will follow-up.  Georganna Skeans, MD, MPH, FACS Trauma: [phone] General Surgery: [phone]

## 2018-10-28 ENCOUNTER — Inpatient Hospital Stay (HOSPITAL_COMMUNITY): Payer: Medicaid Other

## 2018-10-28 LAB — GLUCOSE, CAPILLARY
Glucose-Capillary: 109 mg/dL — ABNORMAL HIGH (ref 70–99)
Glucose-Capillary: 162 mg/dL — ABNORMAL HIGH (ref 70–99)
Glucose-Capillary: 130 mg/dL — ABNORMAL HIGH (ref 70–99)
Glucose-Capillary: 117 mg/dL — ABNORMAL HIGH (ref 70–99)

## 2018-10-28 MED ORDER — IOHEXOL 300 MG/ML  SOLN
100.0000 mL | Freq: Once | INTRAMUSCULAR | Status: AC | PRN
  Administered 2018-10-28: 100 mL via INTRAVENOUS

## 2018-10-28 NOTE — Progress Notes (Signed)
RT NOTE: Trach change due for chronic trach. RT spoke with Dr.Mannam, MD. MD states to not change trach today. RT will continue to monitor.

## 2018-10-28 NOTE — Progress Notes (Signed)
PT Cancellation Note  Patient Details Name: Andrea [name] MRN: [id number] DOB: 07-Jun-1961   Cancelled Treatment:    Reason Eval/Treat Not Completed: Other (comment) Nursing request PT to hold therapy today - awaiting IV access and transport to CT. PT to continue to follow.    Lanney Gins, PT, DPT Supplemental Physical Therapist 10/28/18 3:14 PM Pager: [phone] Office: [phone]

## 2018-10-28 NOTE — Clinical Social Work Note (Signed)
Tried calling [place] and [facility] to check on bed availability. No answer and unable to leave voicemail at [place]. Left voicemail at [facility].  Dayton Scrape, [place]

## 2018-10-28 NOTE — Plan of Care (Signed)
  Problem: Nutrition: Goal: Adequate nutrition will be maintained Outcome: Progressing Note:  Pt able to eat at least 50% of meals during my care. Pt did refuse her evening ensure d/t having to drink 2 bottles of contrast. She stated she was full.    Problem: Skin Integrity: Goal: Risk for impaired skin integrity will decrease Outcome: Progressing Note:  Dressing changed on abdominal dressing during my shift. Wound bed was moist and red and a small amount of drainage on dressing.

## 2018-10-28 NOTE — Progress Notes (Signed)
STROKE TEAM PROGRESS NOTE   SUBJECTIVE (INTERVAL HISTORY) Patient with abd pain over night. Trauma in to see. Given complicated PEG hx, CT abd / pelvis ordered. No complaints of pain currently. Will follow. CT not completed yet. Today patient denied an issues.  OBJECTIVE Vitals:   10/28/18 0801 10/28/18 0902 10/28/18 1015 10/28/18 1200  BP: (!) 145/78  (!) 144/84 (!) 149/86  Pulse: 77 75  76  Resp: (!) 26 (!) 22  (!) 30  Temp: 98.9 F (37.2 C)   97.8 F (36.6 C)  TempSrc: Oral   Axillary  SpO2: 100% 99%  100%  Weight:      Height:        CBC:  Recent Labs  Lab 10/27/18 0422  WBC 9.9  HGB 10.1*  HCT 33.0*  MCV 85.7  PLT 452*    Basic Metabolic Panel:  Recent Labs  Lab 10/27/18 0422  NA 137  K 4.2  CL 105  CO2 23  GLUCOSE 117*  BUN 26*  CREATININE 0.65  CALCIUM 11.5*    IMAGING No results found.    PHYSICAL EXAM  This is an obese African-American female, on trach and PEG.  Patient has a speaking valve and speech is slightly dysarthric possibly due to poor dentition but speech is spontaneous spontaneous and patient comprehends conversation and answers appropriately or nods.  Patient follows commands, answers questions appropriately, is oriented to name and month and year,.  She is not in distress.  Pupils are equally round and reactive, extraocular muscles are intact and she can see in all quadrants, weak smile and tongue protrusion however intact, patient can smile and face appears symmetric, trigeminal sensation intact, patient is able to minimally move left arm and left leg possibly 2 out of 5.  Decreased tone left arm and left leg.  Right arm and leg flaccid paralysis.  Patient reports she can feel sensation in all limbs. Exam stable today, no changes. Patient denies abdomina pain on palpation.  ASSESSMENT/PLAN Ms. [name] is a 57 y.o. female with history of difficult to control hypertension, insulin-dependent diabetes, obesity, hypothyroidism status  post ablation presenting with chest discomfort, right-sided weakness, slurred speech and right facial droop. She did not receive IV t-PA due to late presentation. S/P stent assisted angioplasty of distal basilar artery.  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 Worsening symptoms with extension of pontine/medullary infarcts with new small bilateral cerebellar infarcts without evidence of stent re-stenosis or occlusion  Remains stable  VTE prophylaxis - heparin subq  on aspirin 81 mg daily PTA. Now on Brilinta 90 mg bid and ASA 81 mg daily.   Therapy recommendations:  SNF  Disposition:  SNF.   Medically ready for d/c  D/c paperwork completed 10/14/18  Abnormal uterine bleeding, stable  Has been following with GYN  Was on megace in the past  CT abd/pelvis 09/03/18 - enlarged fibroid uterus  continue megace 80mg  bid  Continue ASA and brilinta for now  OBGYN recs appreciated  Received prmarin 25mg  IV once - vaginal bleeding much improved  Anemia, resolved  Hb 6.8->PRBC->7.1--->10.3  Likely due to iron deficiency, heavy periods and acute uterine bleeding and abdominal wall surgeries  Iron panel showed iron deficiency  On iron solutions  PRBC transfusion 1 U 10/04/2018 and 2U 09/07/18 and 2U 09/21/18  Intracranial stenosis  BA mid to distal severe stenosis s/p BA stenting  Left PCA severe stenosis, R PCA moderate stenosis  Left MCA moderate stenosis  Uncontrolled stroke risk factors with HLD, HTN, DM  Non compliance with meds at home  Respiratory failure  S/p trach 08/26/2018  Able to speak with [name] valve although dysarthric  Per CCM will continue trach d/t weak cough, secretions, plugging  Maintain trach collar  Maintain current trach type and size  CCM to continue to follow  Follow up at d.c in the trach clinic  Hypertension  Stable so far  BP goal 140-170s  On Coreg 25 bid, amlodipine and  hydralazine  Hyperlipidemia  Lipid lowering medication PTA:  Lipitor 20 mg daily  LDL 174, goal < 70  Now on Lipitor 80 mg daily  Continue statin at discharge  Diabetes  Glucoses  100-120s  HgbA1c 11.4, goal < 7.0  Lantus 15U bid   Basal NovoLog to 3U 4 times a day with Vital AF feeding  SSI  QAC+QHS  CBG monitoring QAC+QHS  Hypernatremia -> hyponatremia-> hypernatremia->normal  Na 143->145->143->139->140 -> 137  On vital AF 4 times a day  On free water 150cc Q4h. Consider decreasing frequency of administration.  Dysphagia s/p PEG complicated by abdominal wall abscess  PEG done 08/11/2018  Removed and replaced dislodged PEG 08/29/2018  CT showed Abdominal wall abscess - s/p open surgical drainage 08/19/2018 persistent foul odor discharge. Reexploration 10/01/2018  Wound debrided appropriately and sutures removed.  No evisceration  On vital AF 4 times a day with supplement  Trauma surgery got sutures out of PEG last week  On on dysphagia 1 diet with nectar thick liquid - eating  12/24 - new abdominal pain during the night. Seen by trauma. CT abd/pelvis pending  Atypical chest pain, resolved  sternal chest pain 10/19/18 after bouts of coughing.   Tele no changes.   Troponin < 0.03.   CXR negative.   Electrolytes WNL  Chest pain since resolved.   Other Stroke Risk Factors  Morbid Obesity, Body mass index is 45.41 kg/m., recommend weight loss, diet and exercise as appropriate   Other Active Problems  Thrombocytosis - likely due to anemia, [phone]  Oral thrush on nystatin mouthwash.  Frequent mouth care  Reported constipation. Continued daily BMs. bisacodyl prn ordered.   Headaches - pt has hydrocodone ordered.  Leg numbness after medications - ? Baclofen 10 mg TID - will decrease to 5 mg bid  Temp 99.5 -> check CBC - wbc stable - ok in AM  Nurse called - pt having nausea -> prn [facility] day # 41   This is an unfortunate  57 year old obese African-American female with multiple risk factors including hypertension, insulin-dependent diabetes, obesity, hyperlipidemia, who presented with right-sided weakness, right facial droop, dysarthria she did not receive IV TPA due to late presentation.  Imaging showed a paramedian left pontine infarct due to basilar artery stenosis status post is status post basilar stenting.  Personally examined patient and images, and have participated in and made any corrections needed to history, physical, neuro exam,assessment and plan as stated above.  I have personally obtained the history, evaluated lab date, reviewed imaging studies and agree with radiology interpretations.    Sarina Ill, MD Stroke Neurology  A total of 15 minutes was spent on this patient, reviewing labs, imaging, diagnosis and different diagnostic and therapeutic options, counseling and coordination of care, risks and benefits of management, compliance, or risk factor reduction and education.

## 2018-10-29 LAB — GLUCOSE, CAPILLARY
Glucose-Capillary: 124 mg/dL — ABNORMAL HIGH (ref 70–99)
Glucose-Capillary: 118 mg/dL — ABNORMAL HIGH (ref 70–99)
Glucose-Capillary: 169 mg/dL — ABNORMAL HIGH (ref 70–99)
Glucose-Capillary: 70 mg/dL (ref 70–99)
Glucose-Capillary: 84 mg/dL (ref 70–99)

## 2018-10-29 NOTE — Progress Notes (Signed)
STROKE TEAM PROGRESS NOTE   SUBJECTIVE (INTERVAL HISTORY) Patient complaints the light from outside is shining through her window and bothers her. The shades are adjusted. NT at bedside, feeding her. No other issues overnight. Denied headache or migraine.  OBJECTIVE Vitals:   10/29/18 0341 10/29/18 0445 10/29/18 0803 10/29/18 1149  BP:  (!) 150/87    Pulse: 97  94 88  Resp: (!) 21  18 16   Temp:      TempSrc:      SpO2: 99%  99% 100%  Weight:      Height:        CBC:  Recent Labs  Lab 10/27/18 0422  WBC 9.9  HGB 10.1*  HCT 33.0*  MCV 85.7  PLT 452*    Basic Metabolic Panel:  Recent Labs  Lab 10/27/18 0422  NA 137  K 4.2  CL 105  CO2 23  GLUCOSE 117*  BUN 26*  CREATININE 0.65  CALCIUM 11.5*    IMAGING Ct Abdomen Pelvis W Contrast  Result Date: 10/28/2018 CLINICAL DATA:  Right-sided abdominal pain for 2 days, initial encounter EXAM: CT ABDOMEN AND PELVIS WITH CONTRAST TECHNIQUE: Multidetector CT imaging of the abdomen and pelvis was performed using the standard protocol following bolus administration of intravenous contrast. CONTRAST:  169mL OMNIPAQUE IOHEXOL 300 MG/ML  SOLN COMPARISON:  09/03/2018 FINDINGS: Lower chest: Mild left basilar atelectasis is noted. Hepatobiliary: Gallbladder is well distended with multiple dependent gallstones stable from the prior exam. The liver is within normal limits. Pancreas: Unremarkable. No pancreatic ductal dilatation or surrounding inflammatory changes. Spleen: Normal in size without focal abnormality. Adrenals/Urinary Tract: The adrenal glands are within normal limits bilaterally. Bilateral renal cysts are seen. The largest of these lies in the upper pole on the left stable from the prior exam. No obstructive changes are seen. The bladder is decompressed and slightly displaced to the right secondary to uterine fibroid changes. Stomach/Bowel: Scattered diverticular change of the colon is noted without evidence of diverticulitis. The  appendix is within normal limits. No small bowel inflammatory changes or obstructive changes are seen. Gastrostomy catheter is noted in place. The previously seen wound anteriorly has healed. The previously seen air-fluid collection in the subcutaneous tissues anteriorly has resolved as well. Vascular/Lymphatic: Aortic atherosclerosis. No enlarged abdominal or pelvic lymph nodes. Reproductive: Uterine fibroid changes are again noted. Dominant lesion measures 6.5 cm in transverse dimension. Other: No abdominal wall hernia or abnormality. No abdominopelvic ascites. Musculoskeletal: Degenerative changes of lumbar spine are seen. No acute bony abnormality is noted. IMPRESSION: Cholelithiasis without complicating factors. Diverticulosis without diverticulitis. Interval healing of previously seen anterior abdominal wall wounds. Previously seen and anterior abdominal wall fluid collection has resolved in the interval. Chronic changes as described. Electronically Signed   By: Inez Catalina M.D.   On: 10/28/2018 20:09      PHYSICAL EXAM: No changes on exam today, stable This is an obese African-American female, on trach and PEG.  Patient has a speaking valve and speech is slightly dysarthric possibly due to poor dentition but speech is spontaneous spontaneous and patient comprehends conversation and answers appropriately or nods.  Patient follows commands, answers questions appropriately, is oriented to name and month and year.  She is not in distress.  Pupils are equally round and reactive, extraocular muscles are intact and she can see in all quadrants, weak smile and tongue protrusion however intact, patient can smile and face appears symmetric, trigeminal sensation intact, patient is able to minimally move left arm and  left leg possibly 2 out of 5.  Decreased tone left arm and left leg.  Right arm and leg flaccid paralysis.  Patient reports she can feel sensation in all limbs. Exam stable today, no changes. Patient  denies abdomina pain on palpation.   ASSESSMENT/PLAN [name] is a 57 y.o. female with history of difficult to control hypertension, insulin-dependent diabetes, obesity, hypothyroidism status post ablation presenting with chest discomfort, right-sided weakness, slurred speech and right facial droop. She did not receive IV t-PA due to late presentation. S/P stent assisted angioplasty of distal basilar artery.  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 Worsening symptoms with extension of pontine/medullary infarcts with new small bilateral cerebellar infarcts without evidence of stent re-stenosis or occlusion  Remains stable  VTE prophylaxis - heparin subq  on aspirin 81 mg daily PTA. Now on Brilinta 90 mg bid and ASA 81 mg daily.   Therapy recommendations:  SNF  Disposition:  SNF.   Medically ready for d/c  D/c paperwork completed 10/14/18  Abnormal uterine bleeding, stable  Has been following with GYN  Was on megace in the past  CT abd/pelvis 09/03/18 - enlarged fibroid uterus  continue megace 80mg  bid  Continue ASA and brilinta for now  OBGYN recs appreciated  Received prmarin 25mg  IV once - vaginal bleeding much improved  Anemia, resolved  Hb 6.8->PRBC->7.1--->10.3  Likely due to iron deficiency, heavy periods and acute uterine bleeding and abdominal wall surgeries  Iron panel showed iron deficiency  On iron solutions  PRBC transfusion 1 U 09/21/2018 and 2U 09/07/18 and 2U 09/21/18  Intracranial stenosis  BA mid to distal severe stenosis s/p BA stenting  Left PCA severe stenosis, R PCA moderate stenosis  Left MCA moderate stenosis  Uncontrolled stroke risk factors with HLD, HTN, DM  Non compliance with meds at home  Respiratory failure  S/p trach 08/08/2018  Able to speak with [name] valve although dysarthric  Per CCM will continue trach d/t weak cough, secretions, plugging  Maintain  trach collar  Maintain current trach type and size  CCM to continue to follow  Follow up at d.c in the trach clinic  Hypertension  Stable so far  BP goal 140-170s  On Coreg 25 bid, amlodipine and hydralazine  Hyperlipidemia  Lipid lowering medication PTA:  Lipitor 20 mg daily  LDL 174, goal < 70  Now on Lipitor 80 mg daily  Continue statin at discharge  Diabetes  Glucoses  100-120s  HgbA1c 11.4, goal < 7.0  Lantus 15U bid   Basal NovoLog to 3U 4 times a day with Vital AF feeding  SSI  QAC+QHS  CBG monitoring QAC+QHS  Hypernatremia -> hyponatremia-> hypernatremia->normal  Na 143->145->143->139->140 -> 137  On vital AF 4 times a day  On free water 150cc Q4h. Consider decreasing frequency of administration.  Dysphagia s/p PEG complicated by abdominal wall abscess, resolved  PEG done 08/24/2018  Removed and replaced dislodged PEG 08/31/2018  CT showed Abdominal wall abscess - s/p open surgical drainage 08/16/2018 persistent foul odor discharge. Reexploration 09/11/2018  Wound debrided appropriately and sutures removed.  No evisceration  On vital AF 4 times a day with supplement  Trauma surgery got sutures out of PEG last week  On on dysphagia 1 diet with nectar thick liquid - eating  12/24 - new abdominal pain during the night. Seen by trauma. CT abd/pelvis unremarkable.  No surgical, acute issues. They signed off.  Atypical chest  pain, resolved  sternal chest pain 10/19/18 after bouts of coughing.   Tele no changes.   Troponin < 0.03.   CXR negative.   Electrolytes WNL  Chest pain since resolved.   Other Stroke Risk Factors  Morbid Obesity, Body mass index is 45.41 kg/m., recommend weight loss, diet and exercise as appropriate   Other Active Problems  Thrombocytosis - likely due to anemia, [phone]  Oral thrush on nystatin mouthwash.  Frequent mouth care  Reported constipation. Continued daily BMs. bisacodyl prn ordered.    Headaches - pt has hydrocodone ordered.  Leg numbness after medications - ? Baclofen 10 mg TID - will decrease to 5 mg bid  Temp 99.5 -> check CBC - wbc stable - ok in AM  Nurse called - pt having nausea -> prn [facility] day # 29   This is an unfortunate 57 year old obese African-American female with multiple risk factors including hypertension, insulin-dependent diabetes, obesity, hyperlipidemia, who presented with right-sided weakness, right facial droop, dysarthria she did not receive IV TPA due to late presentation.  Imaging showed a paramedian left pontine infarct due to basilar artery stenosis status post is status post basilar stenting. Right side 0/5. Left side 1-2/5.  Personally examined patient and images, and have participated in and made any corrections needed to history, physical, neuro exam,assessment and plan as stated above.  I have personally obtained the history, evaluated lab date, reviewed imaging studies and agree with radiology interpretations.    Sarina Ill, MD Stroke Neurology  A total of 15 minutes was spent on this patient, reviewing labs, imaging, diagnosis and different diagnostic and therapeutic options, counseling and coordination of care, risks and benefits of management, compliance, or risk factor reduction and education.

## 2018-10-29 NOTE — Progress Notes (Signed)
Patient ID: Andrea [name], female   DOB: [birth year], 57 y.o.   MRN: [id number] CT reviewed. No acute surgical issues. Continue current plan of care.  Georganna Skeans, MD, MPH, FACS Trauma: [phone] General Surgery: [phone]

## 2018-10-30 DIAGNOSIS — L8990 Pressure ulcer of unspecified site, unspecified stage: Secondary | ICD-10-CM

## 2018-10-30 LAB — GLUCOSE, CAPILLARY
Glucose-Capillary: 93 mg/dL (ref 70–99)
Glucose-Capillary: 137 mg/dL — ABNORMAL HIGH (ref 70–99)
Glucose-Capillary: 118 mg/dL — ABNORMAL HIGH (ref 70–99)
Glucose-Capillary: 97 mg/dL (ref 70–99)

## 2018-10-30 NOTE — Social Work (Signed)
Spoke with Clarene Critchley at [facility], they are awaiting a revisit from "the state," for them to approve managing trach pt. When this visit has occurred they will let CSW know.  Westley Hummer, MSW, [place] [employer] [phone]

## 2018-10-30 NOTE — Progress Notes (Signed)
STROKE TEAM PROGRESS NOTE   SUBJECTIVE (INTERVAL HISTORY) No complaints.  BP is well controlled.    OBJECTIVE Vitals:   10/30/18 0050 10/30/18 0431 10/30/18 0435 10/30/18 0734  BP:  136/78 136/78 136/78  Pulse: 72 71 77 76  Resp: 19 17 19 19   Temp:  98.5 F (36.9 C)    TempSrc:  Axillary    SpO2: 100% 99%    Weight:      Height:        CBC:  Recent Labs  Lab 10/27/18 0422  WBC 9.9  HGB 10.1*  HCT 33.0*  MCV 85.7  PLT 452*    Basic Metabolic Panel:  Recent Labs  Lab 10/27/18 0422  NA 137  K 4.2  CL 105  CO2 23  GLUCOSE 117*  BUN 26*  CREATININE 0.65  CALCIUM 11.5*    IMAGING No results found.    PHYSICAL EXAM:  Awake, alert.  Trach. Face symmetric.  Tongue midline. EOMI. PERL. Strength 2/5 LUE and 0/5 RUE.  1/5 LLE and 0/5 RLE. Sensory decreased pinprick throughout all 4 extremities.     ASSESSMENT/PLAN [name] is a 57 y.o. female with history of difficult to control hypertension, insulin-dependent diabetes, obesity, hypothyroidism status post ablation presenting with chest discomfort, right-sided weakness, slurred speech and right facial droop. She did not receive IV t-PA due to late presentation. S/P stent assisted angioplasty of distal basilar artery.  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 Worsening symptoms with extension of pontine/medullary infarcts with new small bilateral cerebellar infarcts without evidence of stent re-stenosis or occlusion  Remains stable  VTE prophylaxis - heparin subq  on aspirin 81 mg daily PTA. Now on Brilinta 90 mg bid and ASA 81 mg daily.   Therapy recommendations:  SNF  Disposition:  SNF.   Medically ready for d/c  D/c paperwork completed 10/14/18  Abnormal uterine bleeding, stable  Has been following with GYN  Was on megace in the past  CT abd/pelvis 09/03/18 - enlarged fibroid uterus  continue megace 80mg  bid  Continue ASA and  brilinta for now  OBGYN recs appreciated  Received prmarin 25mg  IV once - vaginal bleeding much improved  Anemia, resolved  Hb 6.8->PRBC->7.1--->10.3  Likely due to iron deficiency, heavy periods and acute uterine bleeding and abdominal wall surgeries  Iron panel showed iron deficiency  On iron solutions  PRBC transfusion 1 U 09/28/2018 and 2U 09/07/18 and 2U 09/21/18  Intracranial stenosis  BA mid to distal severe stenosis s/p BA stenting  Left PCA severe stenosis, R PCA moderate stenosis  Left MCA moderate stenosis  Uncontrolled stroke risk factors with HLD, HTN, DM  Non compliance with meds at home  Respiratory failure  S/p trach 08/18/2018  Able to speak with Andrea [name] valve although dysarthric  Per CCM will continue trach d/t weak cough, secretions, plugging  Maintain trach collar  Maintain current trach type and size  CCM to continue to follow  Follow up at d.c in the trach clinic  Hypertension  Stable so far  BP goal 140-170s  On Coreg 25 bid, amlodipine and hydralazine  Hyperlipidemia  Lipid lowering medication PTA:  Lipitor 20 mg daily  LDL 174, goal < 70  Now on Lipitor 80 mg daily  Continue statin at discharge  Diabetes  Glucoses  100-120s  HgbA1c 11.4, goal < 7.0  Lantus 15U bid   Basal NovoLog to 3U 4 times a day with Vital  AF feeding  SSI  QAC+QHS  CBG monitoring QAC+QHS  Hypernatremia -> hyponatremia-> hypernatremia->normal  Na 143->145->143->139->140 -> 137  On vital AF 4 times a day  On free water 150cc Q4h. Consider decreasing frequency of administration.  Dysphagia s/p PEG complicated by abdominal wall abscess, resolved  PEG done 08/24/2018  Removed and replaced dislodged PEG 08/27/2018  CT showed Abdominal wall abscess - s/p open surgical drainage 08/26/2018 persistent foul odor discharge. Reexploration 10/02/2018  Wound debrided appropriately and sutures removed.  No evisceration  On vital AF 4 times a day with  supplement  Trauma surgery got sutures out of PEG last week  On on dysphagia 1 diet with nectar thick liquid - eating  12/24 - new abdominal pain during the night. Seen by trauma. CT abd/pelvis unremarkable.  No surgical, acute issues. They signed off.  Atypical chest pain, resolved  sternal chest pain 10/19/18 after bouts of coughing.   Tele no changes.   Troponin < 0.03.   CXR negative.   Electrolytes WNL  Chest pain since resolved.   Other Stroke Risk Factors  Morbid Obesity, Body mass index is 45.41 kg/m., recommend weight loss, diet and exercise as appropriate   Other Active Problems  Thrombocytosis - likely due to anemia, [phone]  Oral thrush on nystatin mouthwash.  Frequent mouth care  Reported constipation. Continued daily BMs. bisacodyl prn ordered.   Headaches - pt has hydrocodone ordered.  Leg numbness after medications - ? 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  She is on dual antiplatelet therapy for stent.  BP is well controlled on Norvasc.  She is on statin at highest dose.  Awaiting SNF placement.  Rogue Jury, MS, MD   Personally examined patient and images, and have participated in and made any corrections needed to history, physical, neuro exam,assessment and plan as stated above.  I have personally obtained the history, evaluated lab date, reviewed imaging studies and agree with radiology interpretations.      A total of 15 minutes was spent on this patient, reviewing labs, imaging, diagnosis and different diagnostic and therapeutic options, counseling and coordination of care, risks and benefits of management, compliance, or risk factor reduction and  education.

## 2018-10-30 NOTE — Progress Notes (Signed)
Nutrition Follow-up  DOCUMENTATION CODES:   Morbid obesity  INTERVENTION:  Let pt attempt to eat at meals, if po intake at a meal is <50% then provide a bolus tube feed via PEG using Vital AF 1.2 formula at volume of 237 ml up to TID.   Additionally provide a bolus feed HS daily.   Provide30 ml Prostat(or equivalent)TIDper tube.   Continue free water flushes of 200 ml QID between bolus feeds. (MD to adjust as appropriate)  If all four bolus tube feeds are given, regimen to provide 1438 kcal (90% of kcal needs), 116 grams of protein (97% of protein needs), and 1568 ml water.   ProvideEnsure Enlive po BID(thickened to nectar thick liquids), each supplement provides 350 kcal and 20 grams of protein  Encourage PO intake.  NUTRITION DIAGNOSIS:   Inadequate oral intake related to inability to eat as evidenced by NPO status; diet advanced; improving  GOAL:   Patient will meet greater than or equal to 90% of their needs; met with TF and PO  MONITOR:   PO intake, Supplement acceptance, Labs, Skin, I & O's, Weight trends, TF tolerance  REASON FOR ASSESSMENT:   Consult Enteral/tube feeding initiation and management  ASSESSMENT:   Andrea [name] is a 57 yo female with PMH of type 2 diabetes, HTN, CKD III, obesity, and anemia admitted for chest pain. Code Stroke called 10/10 in AM. 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 Intubated 10/10 in ICU.  0/21 trach and PEG.11/1 ex lap, I&D.No plans for decannulation as pt with weak cough.   Pt continues on a dysphagia 1 diet with nectar thick liquids. Meal completion has been varied from 25-95%. Pt has been receiving bolus tube feeds if intake at a meal has been <50%. RD to continue with current tube feeding orders to aid in adequate nutrition. Pt continues to await SNF placement. Labs and medications reviewed.   Diet Order:   Diet Order            DIET - DYS 1 Room service appropriate? Yes; Fluid consistency: Nectar Thick   Diet effective now              EDUCATION NEEDS:   Not appropriate for education at this time  Skin:  Skin Assessment: Skin Integrity Issues: Skin Integrity Issues:: Stage II, Incisions Stage II: sacrum Incisions: abdomen  Last BM:  12/25  Height:   Ht Readings from Last 1 Encounters:  09/03/18 5' 4"  (1.626 m)    Weight:   Wt Readings from Last 1 Encounters:  10/25/18 120 kg    Ideal Body Weight:  54.5 kg  BMI:  Body mass index is 45.41 kg/m.  Estimated Nutritional Needs:   Kcal:  1600-1800  Protein:  120-135 grams  Fluid:  > 1.6 L    Andrea Parker, MS, RD, LDN Pager # [phone] After hours/ weekend pager # [phone]

## 2018-10-31 DIAGNOSIS — K5909 Other constipation: Secondary | ICD-10-CM

## 2018-10-31 LAB — GLUCOSE, CAPILLARY
Glucose-Capillary: 92 mg/dL (ref 70–99)
Glucose-Capillary: 152 mg/dL — ABNORMAL HIGH (ref 70–99)
Glucose-Capillary: 183 mg/dL — ABNORMAL HIGH (ref 70–99)

## 2018-10-31 MED ORDER — RESOURCE THICKENUP CLEAR PO POWD
ORAL | Status: DC | PRN
  Filled 2018-10-31 (×2): qty 125

## 2018-10-31 NOTE — Plan of Care (Signed)

## 2018-10-31 NOTE — Progress Notes (Signed)
Patient c/o pressure pain in her chest, HR is going up and down in the 150' and back to normal. MD paged, advise to page NP with stroke team. NP paged at this time and awaiting on call. Will continue to monitor patient.

## 2018-10-31 NOTE — Progress Notes (Signed)
Patient was bleeding after heparin shot was given. Patient was cleaned and pressure dressing applied to the left lower abdomen. Will continue to monitor patient.

## 2018-10-31 NOTE — Progress Notes (Signed)
STROKE TEAM PROGRESS NOTE   SUBJECTIVE (INTERVAL HISTORY) Complains of LLQ abd pain, hasn't had a BM in days. Spoke with RN about bowel plan. BP is well controlled.    OBJECTIVE Vitals:   10/31/18 1140 10/31/18 1146 10/31/18 1217 10/31/18 1651  BP:   (!) 150/74   Pulse:      Resp:      Temp: 98.8 F (37.1 C)   98.7 F (37.1 C)  TempSrc: Oral   Axillary  SpO2:  100%    Weight:      Height:        CBC:  Recent Labs  Lab 10/27/18 0422  WBC 9.9  HGB 10.1*  HCT 33.0*  MCV 85.7  PLT 452*    Basic Metabolic Panel:  Recent Labs  Lab 10/27/18 0422  NA 137  K 4.2  CL 105  CO2 23  GLUCOSE 117*  BUN 26*  CREATININE 0.65  CALCIUM 11.5*    IMAGING No results found.    PHYSICAL EXAM:  No acute distress. Abd is distended, firm and tender with LLQ palp Trach in place, no respiratory difficulty A&O, appropriate in attempt to speak. . Face symmetric.  Tongue midline. EOMI. PERRL. Strength 2/5 LUE and 0/5 RUE.  1/5 LLE and 0/5 RLE; Trace toe movement seen today Sensory decreased pinprick throughout all 4 extremities.  ASSESSMENT/PLAN Ms. CACIE GASKINS is a 57 y.o. female with history of difficult to control hypertension, insulin-dependent diabetes, obesity, hypothyroidism status post ablation presenting with chest discomfort, right-sided weakness, slurred speech and right facial droop. She did not receive IV t-PA due to late presentation. S/P stent assisted angioplasty of distal basilar artery.  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 Worsening symptoms with extension of pontine/medullary infarcts with new small bilateral cerebellar infarcts without evidence of stent re-stenosis or occlusion  Remains stable  VTE prophylaxis - heparin subq  on aspirin 81 mg daily PTA. Now on Brilinta 90 mg bid and ASA 81 mg daily.   Therapy recommendations:  SNF  Disposition:  SNF.   Medically ready for d/c  D/c paperwork  completed 10/14/18  Abnormal uterine bleeding, stable  Has been following with GYN  Was on megace in the past  CT abd/pelvis 09/03/18 - enlarged fibroid uterus  continue megace 80mg  bid  Continue ASA and brilinta for now  OBGYN recs appreciated  Received prmarin 25mg  IV once - vaginal bleeding much improved  Anemia, resolved  Hb 6.8->PRBC->7.1--->10.3  Likely due to iron deficiency, heavy periods and acute uterine bleeding and abdominal wall surgeries  Iron panel showed iron deficiency  On iron solutions  PRBC transfusion 1 U 09/20/2018 and 2U 09/07/18 and 2U 09/21/18  Intracranial stenosis  BA mid to distal severe stenosis s/p BA stenting  Left PCA severe stenosis, R PCA moderate stenosis  Left MCA moderate stenosis  Uncontrolled stroke risk factors with HLD, HTN, DM  Non compliance with meds at home  Respiratory failure  S/p trach 08/13/2018  Able to speak with [name] valve although dysarthric  Per CCM will continue trach d/t weak cough, secretions, plugging  Maintain trach collar  Maintain current trach type and size  CCM to continue to follow  Follow up at d.c in the trach clinic  Hypertension  Stable so far  BP goal 140-170s  On Coreg 25 bid, amlodipine and hydralazine  Hyperlipidemia  Lipid lowering medication PTA:  Lipitor 20 mg daily  LDL 174, goal <  70  Now on Lipitor 80 mg daily  Continue statin at discharge  Diabetes  Glucoses  100-120s  HgbA1c 11.4, goal < 7.0  Lantus 15U bid   Basal NovoLog to 3U 4 times a day with Vital AF feeding  SSI  QAC+QHS  CBG monitoring QAC+QHS  Hypernatremia -> hyponatremia-> hypernatremia->normal  Na 143->145->143->139->140 -> 137  On vital AF 4 times a day  On free water 150cc Q4h. Consider decreasing frequency of administration.  Dysphagia s/p PEG complicated by abdominal wall abscess, resolved  PEG done 08/30/2018  Removed and replaced dislodged PEG 08/25/2018  CT showed  Abdominal wall abscess - s/p open surgical drainage 08/11/2018 persistent foul odor discharge. Reexploration 09/12/2018  Wound debrided appropriately and sutures removed.  No evisceration  On vital AF 4 times a day with supplement  Trauma surgery got sutures out of PEG last week  On on dysphagia 1 diet with nectar thick liquid - eating  12/24 - new abdominal pain during the night. Seen by trauma. CT abd/pelvis unremarkable.  No surgical, acute issues. They signed off.  Atypical chest pain, resolved  sternal chest pain 10/19/18 after bouts of coughing.   Tele no changes.   Troponin < 0.03.   CXR negative.   Electrolytes WNL  Chest pain since resolved.   Other Stroke Risk Factors  Morbid Obesity, Body mass index is 45.41 kg/m., recommend weight loss, diet and exercise as appropriate   Other Active Problems  Thrombocytosis - likely due to anemia, [id number]  Oral thrush on nystatin mouthwash.  Frequent mouth care  Reported constipation. Continued daily BMs. bisacodyl prn ordered.   Headaches - pt has hydrocodone ordered.  Leg numbness after medications - ? Baclofen 10 mg TID - will decrease to 5 mg bid  Temp 99.5 -> check CBC - wbc stable - ok in AM -> afebrile 12/26 ; last CBC 12/23  Constipation with abd discomfort- bowel plan started  Hospital day # 79   A total of 15 minutes was spent on this patient, reviewing labs, imaging, diagnosis and different diagnostic and therapeutic options, counseling and coordination of care, risks and benefits of management, compliance, or risk factor reduction and education.

## 2018-10-31 NOTE — Progress Notes (Signed)
Physical Therapy Treatment Patient Details Name: Andrea [name] MRN: [id number] DOB: [birth year] Today's Date: 10/31/2018    History of Present Illness Pt is a 57 y/o female who presented to ED with chest pain noted Rt hemiparesis in ED with acute Left paramedian brainstem infarct. Intubated 10/10 for angioplasty, extubated post procedure and reintubated. Repeat MRI with brainstem infarct extension and bil cerebellar infarcts. Peg/trach with return to vent 08/09/2018. 10/22-29 on trach collar, return to vent 10/29. 10/28 I & D of abdominal wall abscess with placement of penrose drain and G-tube, 11/1 repeat ex lap. 11/7 return to trach collar.  PMHx: HTN, DM, CKD    PT Comments    Pt remains very limited secondary to weakness (R>L). Utilized the PMV during session with full supervision with SPO2 maintaining >94% throughout. Of note, pt's HR elevating from mid 70's to as high as 156 bpm with pt supine in bed at rest. This occurred several times throughout session and RN was notified.   Pt would continue to benefit from skilled physical therapy services at this time while admitted and after d/c to address the below listed limitations in order to improve overall safety and independence with functional mobility.   Follow Up Recommendations  SNF;Supervision/Assistance - 24 hour     Equipment Recommendations  Hospital bed;Wheelchair (measurements PT)    Recommendations for Other Services       Precautions / Restrictions Precautions Precautions: Fall Precaution Comments: Peg/trach Restrictions Weight Bearing Restrictions: No    Mobility  Bed Mobility Overal bed mobility: Needs Assistance             General bed mobility comments: total A x2 for repositioning in bed in preparation for pt to eat lunch  Transfers                    Ambulation/Gait                 Stairs             Wheelchair Mobility    Modified Rankin (Stroke Patients  Only) Modified Rankin (Stroke Patients Only) Pre-Morbid Rankin Score: No symptoms Modified Rankin: Severe disability     Balance                                            Cognition Arousal/Alertness: Awake/alert Behavior During Therapy: WFL for tasks assessed/performed Overall Cognitive Status: Impaired/Different from baseline Area of Impairment: Attention;Memory;Safety/judgement;Awareness;Problem solving                   Current Attention Level: Selective Memory: Decreased short-term memory   Safety/Judgement: Decreased awareness of safety;Decreased awareness of deficits Awareness: Emergent Problem Solving: Slow processing        Exercises General Exercises - Upper Extremity Shoulder Flexion: AAROM;PROM;Both;10 reps;Supine Shoulder ABduction: AAROM;PROM;Both;10 reps;Supine Elbow Flexion: AAROM;PROM;Both;10 reps;Supine Elbow Extension: AAROM;PROM;Both;10 reps;Supine General Exercises - Lower Extremity Ankle Circles/Pumps: PROM;Both;10 reps;Supine Short Arc Quad: AAROM;PROM;Both;10 reps;Supine Heel Slides: PROM;Both;10 reps;Supine Other Exercises Other Exercises: ankle DF stretch bilaterally    General Comments        Pertinent Vitals/Pain Pain Assessment: Faces Faces Pain Scale: Hurts even more Pain Location: bilateral LEs with PROM/AAROM Pain Descriptors / Indicators: Grimacing;Guarding Pain Intervention(s): Monitored during session;Repositioned    Home Living  Prior Function            PT Goals (current goals can now be found in the care plan section) Acute Rehab PT Goals PT Goal Formulation: With patient Time For Goal Achievement: 11/07/18 Potential to Achieve Goals: Fair Progress towards PT goals: Progressing toward goals    Frequency    Min 2X/week      PT Plan Current plan remains appropriate    Co-evaluation              AM-PAC PT "6 Clicks" Mobility   Outcome Measure   Help needed turning from your back to your side while in a flat bed without using bedrails?: Total Help needed moving from lying on your back to sitting on the side of a flat bed without using bedrails?: Total Help needed moving to and from a bed to a chair (including a wheelchair)?: Total Help needed standing up from a chair using your arms (e.g., wheelchair or bedside chair)?: Total Help needed to walk in hospital room?: Total Help needed climbing 3-5 steps with a railing? : Total 6 Click Score: 6    End of Session Equipment Utilized During Treatment: Oxygen;Other (comment)(trach collar) Activity Tolerance: Patient tolerated treatment well Patient left: in bed;with call bell/phone within reach;with bed alarm set Nurse Communication: Mobility status;Need for lift equipment PT Visit Diagnosis: Other abnormalities of gait and mobility (R26.89);Muscle weakness (generalized) (M62.81);Other symptoms and signs involving the nervous system (R29.898);Hemiplegia and hemiparesis Hemiplegia - Right/Left: Right Hemiplegia - dominant/non-dominant: Dominant Hemiplegia - caused by: Cerebral infarction     Time: 1137-1209 PT Time Calculation (min) (ACUTE ONLY): 32 min  Charges:  $Therapeutic Exercise: 8-22 mins $Therapeutic Activity: 8-22 mins                     Sherie Don, PT, DPT  Acute Rehabilitation Services Pager [phone] Office [place] 10/31/2018, 2:53 PM

## 2018-10-31 NOTE — Progress Notes (Signed)
Occupational Therapy Treatment Patient Details Name: Andrea [name] MRN: [id number] DOB: 02-Aug-1961 Today's Date: 10/31/2018    History of present illness Pt is a 57 y/o female who presented to ED with chest pain noted Rt hemiparesis in ED with acute Left paramedian brainstem infarct. Intubated 10/10 for angioplasty, extubated post procedure and reintubated. Repeat MRI with brainstem infarct extension and bil cerebellar infarcts. Peg/trach with return to vent 09/02/2018. 10/22-29 on trach collar, return to vent 10/29. 10/28 I & D of abdominal wall abscess with placement of penrose drain and G-tube, 11/1 repeat ex lap. 11/7 return to trach collar.  PMHx: HTN, DM, CKD   OT comments  Pt seen for bed level exercise. Pt with increased ROM LUE and assisting with simple grooming tasks. Pt able to use soft touch call bell with decreased effort. Pt complaining of stomach pain/trying to have a BM - nursing aware. Will continue to follow.   Follow Up Recommendations  SNF;Supervision/Assistance - 24 hour    Equipment Recommendations       Recommendations for Other Services      Precautions / Restrictions Precautions Precautions: Fall Precaution Comments: Peg/trach Restrictions Weight Bearing Restrictions: No       Mobility Bed Mobility Overal bed mobility: Needs Assistance             General bed mobility comments: total A x2 for repositioning in bed in preparation for pt to eat lunch  Transfers                      Balance                                           ADL either performed or assessed with clinical judgement   ADL       Grooming: Moderate assistance Grooming Details (indicate cue type and reason): pt ableto wash mouth; face and apply chapstcik                                     Vision       Perception     Praxis      Cognition Arousal/Alertness: Awake/alert Behavior During Therapy: WFL for tasks  assessed/performed Overall Cognitive Status: Impaired/Different from baseline Area of Impairment: Memory;Awareness                   Current Attention Level: Selective Memory: Decreased short-term memory   Safety/Judgement: Decreased awareness of deficits Awareness: Emergent Problem Solving: Slow processing General Comments: cognition continues to improve; pt able to report events from the day        Exercises Exercises: General Upper Extremity;General Lower Extremity;Other exercises General Exercises - Upper Extremity Shoulder Flexion: AAROM;PROM;Both;10 reps;Supine Shoulder ABduction: AAROM;PROM;Both;10 reps;Supine Shoulder ADduction: PROM;Right;AAROM;Left;10 reps;Seated Shoulder Horizontal ABduction: PROM;AROM;Both;10 reps;Supine Shoulder Horizontal ADduction: AAROM;PROM;Both;10 reps;Supine Elbow Flexion: AAROM;PROM;Both;10 reps;Supine Elbow Extension: AAROM;PROM;Both;10 reps;Supine Wrist Flexion: PROM;Right;AAROM;Left;10 reps Wrist Extension: PROM;Right;AAROM;Left;15 reps;Supine Digit Composite Flexion: PROM;Right;AAROM;Left;10 reps;Supine Composite Extension: PROM;Right;AAROM;Left;15 reps;Seated General Exercises - Lower Extremity Ankle Circles/Pumps: PROM;Both;10 reps;Supine Short Arc Quad: AAROM;PROM;Both;10 reps;Supine Heel Slides: PROM;Both;10 reps;Supine Other Exercises Other Exercises: ankle DF stretch bilaterally   Shoulder Instructions       General Comments      Pertinent Vitals/ Pain       Pain Assessment: Faces Faces Pain Scale:  Hurts even more Pain Location: BUE with ROM; stomach pain Pain Descriptors / Indicators: Grimacing;Guarding Pain Intervention(s): Limited activity within patient's tolerance  Home Living                                          Prior Functioning/Environment              Frequency  Min 2X/week        Progress Toward Goals  OT Goals(current goals can now be found in the care plan  section)  Progress towards OT goals: Progressing toward goals  Acute Rehab OT Goals Patient Stated Goal: to be able to help feed myself OT Goal Formulation: With patient/family Time For Goal Achievement: 11/03/18 Potential to Achieve Goals: Fair ADL Goals Pt Will Perform Eating: with mod assist;with adaptive utensils;with assist to don/doff brace/orthosis;sitting Pt Will Perform Grooming: with max assist;bed level Pt Will Perform Upper Body Bathing: with max assist;bed level Pt/caregiver will Perform Home Exercise Program: Increased ROM;With written HEP provided;Both right and left upper extremity Additional ADL Goal #1: Pt will sit EOB with +2 Max A x 10 min in preparation for functional activities. Additional ADL Goal #2: Pt will consistently use soft touch call bell 4/4 trials to increase ability to manage self care. Additional ADL Goal #3: Pt will tolerate 2 hrs out of bed in whellchair to increase activity tolerance. Additional ADL Goal #4: Pt will dmeonstrate to pick up washcloth with L hand to increase functional use L UE for ADL  Plan Discharge plan remains appropriate;Frequency remains appropriate    Co-evaluation                 AM-PAC OT "6 Clicks" Daily Activity     Outcome Measure   Help from another person eating meals?: A Lot Help from another person taking care of personal grooming?: A Lot Help from another person toileting, which includes using toliet, bedpan, or urinal?: Total Help from another person bathing (including washing, rinsing, drying)?: Total Help from another person to put on and taking off regular upper body clothing?: Total Help from another person to put on and taking off regular lower body clothing?: Total 6 Click Score: 8    End of Session Equipment Utilized During Treatment: Oxygen  OT Visit Diagnosis: Other abnormalities of gait and mobility (R26.89);Muscle weakness (generalized) (M62.81);Low vision, both eyes (H54.2);Feeding  difficulties (R63.3);Other symptoms and signs involving cognitive function;Hemiplegia and hemiparesis Hemiplegia - Right/Left: Right Hemiplegia - dominant/non-dominant: Non-Dominant Hemiplegia - caused by: Nontraumatic intracerebral hemorrhage;Cerebral infarction   Activity Tolerance Patient tolerated treatment well(complaining of stomach pain)   Patient Left in bed;with call bell/phone within reach   Nurse Communication Mobility status;Other (comment)(use of sof touch call bell)        Time: 4854-6270 OT Time Calculation (min): 20 min  Charges: OT General Charges $OT Visit: 1 Visit OT Treatments $Therapeutic Activity: 8-22 mins  Maurie Boettcher, OT/L   Acute OT Clinical Specialist [place] Pager [phone] Office [phone]    [facility] 10/31/2018, 4:04 PM

## 2018-11-01 LAB — GLUCOSE, CAPILLARY
GLUCOSE-CAPILLARY: 151 mg/dL — AB (ref 70–99)
GLUCOSE-CAPILLARY: 196 mg/dL — AB (ref 70–99)
Glucose-Capillary: 190 mg/dL — ABNORMAL HIGH (ref 70–99)
Glucose-Capillary: 241 mg/dL — ABNORMAL HIGH (ref 70–99)

## 2018-11-01 MED ORDER — HYDRALAZINE HCL 50 MG PO TABS
50.0000 mg | ORAL_TABLET | Freq: Three times a day (TID) | ORAL | Status: DC
  Administered 2018-11-01 – 2018-11-02 (×2): 50 mg
  Filled 2018-11-01 (×2): qty 1

## 2018-11-01 NOTE — Progress Notes (Signed)
STROKE TEAM PROGRESS NOTE   SUBJECTIVE (INTERVAL HISTORY) No acute event overnight.  No complaints.  BP on the high end, no fever.    OBJECTIVE Vitals:   11/01/18 0014 11/01/18 0317 11/01/18 0516 11/01/18 0748  BP: (!) 155/74  (!) 163/81 (!) 180/90  Pulse: 72 78 78 77  Resp: (!) 24 (!) 22 (!) 23 (!) 24  Temp: 98.8 F (37.1 C)  98.9 F (37.2 C) 98.5 F (36.9 C)  TempSrc: Oral  Oral Axillary  SpO2: 95% 100% 100% 100%  Weight:      Height:        CBC:  Recent Labs  Lab 10/27/18 0422  WBC 9.9  HGB 10.1*  HCT 33.0*  MCV 85.7  PLT 452*    Basic Metabolic Panel:  Recent Labs  Lab 10/27/18 0422  NA 137  K 4.2  CL 105  CO2 23  GLUCOSE 117*  BUN 26*  CREATININE 0.65  CALCIUM 11.5*    IMAGING No results found.    PHYSICAL EXAM:  General -  Obese middle-aged African-American lady, on trach collar with speaking valve, not in distress. Her abd is soft, nondistended and nontender. PEG tube in place.  Cardiovascular - regular rate and rhythm Neurological Exam - on trach collar, eyes open, awake, alert. No ptosis or EOMI, denies diplopia.  PERRL. Bilateral facial weakness, tongue protrusion weak. Able to wiggle left hand fingers and left foot toes. 2-/5 LLE proximal and 2/5 distally and 2/5 LUE. Follows commands and good eye contact.  Speech is moderate dysarthric with speaking valve. No aphasia. Sensation intact to touch, cold. gait not tested.    ASSESSMENT/PLAN [name] is a 57 y.o. female with history of difficult to control hypertension, insulin-dependent diabetes, obesity, hypothyroidism status post ablation presenting with chest discomfort, right-sided weakness, slurred speech and right facial droop. She did not receive IV t-PA due to late presentation. S/P stent assisted angioplasty of distal basilar artery.  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 Worsening symptoms with extension of pontine/medullary  infarcts with new small bilateral cerebellar infarcts without evidence of stent re-stenosis or occlusion  Remains stable  VTE prophylaxis - heparin subq  on aspirin 81 mg daily PTA. Now on Brilinta 90 mg bid and ASA 81 mg daily.   Therapy recommendations:  SNF  Disposition:  SNF.   Medically ready for d/c  D/c paperwork completed 10/14/18  Abnormal uterine bleeding, stable  Has been following with GYN  Was on megace in the past  CT abd/pelvis 09/03/18 - enlarged fibroid uterus  continue megace 80mg  bid  Continue ASA and brilinta for now  OBGYN recs appreciated  Received prmarin 25mg  IV once - vaginal bleeding much improved  Anemia, resolved  Hb 6.8->PRBC->7.1->10.3  Likely due to iron deficiency, heavy periods and acute uterine bleeding and abdominal wall surgeries  Iron panel showed iron deficiency  On iron solutions  PRBC transfusion 1 U 09/30/2018 and 2U 09/07/18 and 2U 09/21/18  Intracranial stenosis  BA mid to distal severe stenosis s/p BA stenting  Left PCA severe stenosis, R PCA moderate stenosis  Left MCA moderate stenosis  Uncontrolled stroke risk factors with HLD, HTN, DM  Non compliance with meds at home  Respiratory failure  S/p trach 08/22/2018  Able to speak with Andrea [name] valve although dysarthric  Per CCM will continue trach d/t weak cough, secretions, plugging  Maintain trach collar  Maintain current trach type and size  CCM to continue to follow  Follow up at d.c in the trach clinic  Hypertension  On the high end  BP goal 130-150  On Coreg 25 bid, amlodipine and hydralazine  Increased hydralazine 50 mg every 8 hours  Hyperlipidemia  Lipid lowering medication PTA:  Lipitor 20 mg daily  LDL 174, goal < 70  Now on Lipitor 80 mg daily  Continue statin at discharge  Diabetes  Glucoses  100-120s  HgbA1c 11.4, goal < 7.0  Lantus 15U bid   Basal NovoLog to 3U 4 times a day with Vital AF feeding  SSI   QAC+QHS  CBG monitoring QAC+QHS  Hypernatremia -> hyponatremia-> hypernatremia->normal  Na 143->145->143->139->140 -> 137  On vital AF 4 times a day  On free water 150cc Q4h. Consider decreasing frequency of administration.  Dysphagia s/p PEG complicated by abdominal wall abscess, resolved  PEG done 08/22/2018  Removed and replaced dislodged PEG 08/26/2018  CT showed Abdominal wall abscess - s/p open surgical drainage 08/18/2018 persistent foul odor discharge. Reexploration 09/09/2018  Wound debrided appropriately and sutures removed.  No evisceration  On vital AF 4 times a day with supplement  Trauma surgery got sutures out of PEG last week  On on dysphagia 1 diet with nectar thick liquid - eating  12/24 - new abdominal pain during the night. Seen by trauma. CT abd/pelvis unremarkable.  No surgical, acute issues. They signed off.  Atypical chest pain, resolved  sternal chest pain 10/19/18 after bouts of coughing.   Tele no changes.   Troponin < 0.03.   CXR negative.   Electrolytes WNL  Chest pain since resolved.   Other Stroke Risk Factors  Morbid Obesity, Body mass index is 45.41 kg/m., recommend weight loss, diet and exercise as appropriate   Other Active Problems  Thrombocytosis - likely due to anemia, [phone]  Oral thrush on nystatin mouthwash.  Frequent mouth care  Check CBC and BMP in a.m.  Hospital day # 56   Rosalin Hawking, MD PhD Stroke Neurology 11/01/2018 7:14 PM

## 2018-11-02 LAB — BASIC METABOLIC PANEL
ANION GAP: 10 (ref 5–15)
BUN: 29 mg/dL — ABNORMAL HIGH (ref 6–20)
CO2: 25 mmol/L (ref 22–32)
Calcium: 11.4 mg/dL — ABNORMAL HIGH (ref 8.9–10.3)
Chloride: 104 mmol/L (ref 98–111)
Creatinine, Ser: 0.66 mg/dL (ref 0.44–1.00)
GFR calc Af Amer: >60 mL/min (ref >60)
GFR calc non Af Amer: >60 mL/min (ref >60)
Glucose, Bld: 142 mg/dL — ABNORMAL HIGH (ref 70–99)
Potassium: 4 mmol/L (ref 3.5–5.1)
Sodium: 139 mmol/L (ref 135–145)

## 2018-11-02 LAB — CBC
HCT: 32.9 % — ABNORMAL LOW (ref 36.0–46.0)
Hemoglobin: 9.7 g/dL — ABNORMAL LOW (ref 12.0–15.0)
MCH: 25.1 pg — ABNORMAL LOW (ref 26.0–34.0)
MCHC: 29.5 g/dL — ABNORMAL LOW (ref 30.0–36.0)
MCV: 85 fL (ref 80.0–100.0)
NRBC: 0 % (ref 0.0–0.2)
Platelets: 385 10*3/uL (ref 150–400)
RBC: 3.87 MIL/uL (ref 3.87–5.11)
RDW: 17.3 % — AB (ref 11.5–15.5)
WBC: 10.2 10*3/uL (ref 4.0–10.5)

## 2018-11-02 LAB — GLUCOSE, CAPILLARY
Glucose-Capillary: 141 mg/dL — ABNORMAL HIGH (ref 70–99)
Glucose-Capillary: 152 mg/dL — ABNORMAL HIGH (ref 70–99)
Glucose-Capillary: 111 mg/dL — ABNORMAL HIGH (ref 70–99)

## 2018-11-02 MED ORDER — INSULIN GLARGINE 100 UNIT/ML ~~LOC~~ SOLN
17.0000 [IU] | Freq: Two times a day (BID) | SUBCUTANEOUS | Status: DC
  Administered 2018-11-02 – 2018-11-13 (×22): 17 [IU] via SUBCUTANEOUS
  Filled 2018-11-02 (×23): qty 0.17

## 2018-11-02 MED ORDER — HYDRALAZINE HCL 50 MG PO TABS
50.0000 mg | ORAL_TABLET | Freq: Four times a day (QID) | ORAL | Status: DC
  Administered 2018-11-02 – 2018-11-06 (×16): 50 mg
  Filled 2018-11-02 (×16): qty 1

## 2018-11-02 NOTE — Progress Notes (Addendum)
STROKE TEAM PROGRESS NOTE   SUBJECTIVE (INTERVAL HISTORY) No family members present. Pt Reports intermittent L arm pain for several days described as sharp and usually relieved with tylenol. Spoke with pts nurse. She was aware and had given the pt some tylenol.  OBJECTIVE Vitals:   11/02/18 0700 11/02/18 0750 11/02/18 0812 11/02/18 1106  BP:      Pulse: 77   73  Resp:      Temp:   98.7 F (37.1 C)   TempSrc:   Oral   SpO2: 100% 100%  100%  Weight:      Height:        CBC:  Recent Labs  Lab 10/27/18 0422 11/02/18 0539  WBC 9.9 10.2  HGB 10.1* 9.7*  HCT 33.0* 32.9*  MCV 85.7 85.0  PLT 452* 947    Basic Metabolic Panel:  Recent Labs  Lab 10/27/18 0422 11/02/18 0539  NA 137 139  K 4.2 4.0  CL 105 104  CO2 23 25  GLUCOSE 117* 142*  BUN 26* 29*  CREATININE 0.65 0.66  CALCIUM 11.5* 11.4*    IMAGING No results found.    PHYSICAL EXAM:  General -  Obese middle-aged African-American lady, on trach collar with speaking valve, not in distress. Her abd is soft, nondistended and nontender. PEG tube in place.  Cardiovascular - regular rate and rhythm Neurological Exam - on trach collar, eyes open, awake, alert. No ptosis or EOMI, denies diplopia.  PERRL. Bilateral facial weakness, tongue protrusion weak. Able to wiggle left hand fingers and left foot toes. 2-/5 LLE proximal and 2/5 distally and 2/5 LUE. Follows commands and good eye contact.  Speech is moderate dysarthric with speaking valve. No aphasia. Sensation intact to touch, cold. gait not tested.    ASSESSMENT/PLAN Ms. AIRIS BARBEE is a 57 y.o. female with history of difficult to control hypertension, insulin-dependent diabetes, obesity, hypothyroidism status post ablation presenting with chest discomfort, right-sided weakness, slurred speech and right facial droop. She did not receive IV t-PA due to late presentation. S/P stent assisted angioplasty of distal basilar artery.  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 Worsening symptoms with extension of pontine/medullary infarcts with new small bilateral cerebellar infarcts without evidence of stent re-stenosis or occlusion  Remains stable  VTE prophylaxis - heparin subq  on aspirin 81 mg daily PTA. Now on Brilinta 90 mg bid and ASA 81 mg daily.   Therapy recommendations:  SNF  Disposition:  SNF.   Medically ready for d/c  D/c paperwork completed 10/14/18  Abnormal uterine bleeding, stable  Has been following with GYN  Was on megace in the past  CT abd/pelvis 09/03/18 - enlarged fibroid uterus  continue megace 80mg  bid  Continue ASA and brilinta for now  OBGYN recs appreciated  Received prmarin 25mg  IV once - vaginal bleeding much improved  Anemia, resolved  Hb 6.8->PRBC->7.1->10.3  Likely due to iron deficiency, heavy periods and acute uterine bleeding and abdominal wall surgeries  Iron panel showed iron deficiency  On iron solutions  PRBC transfusion 1 U 09/30/2018 and 2U 09/07/18 and 2U 09/21/18  Intracranial stenosis  BA mid to distal severe stenosis s/p BA stenting  Left PCA severe stenosis, R PCA moderate stenosis  Left MCA moderate stenosis  Uncontrolled stroke risk factors with HLD, HTN, DM  Non compliance with meds at home  Respiratory failure  S/p trach 08/31/2018  Able to speak with [name] valve although dysarthric  Per CCM will continue trach d/t weak cough, secretions, plugging  Maintain trach collar  Maintain current trach type and size  CCM to continue to follow  Follow up at d.c in the trach clinic  Hypertension  On the high end  BP goal 130-150  On Coreg 25 bid, amlodipine and hydralazine  Increased hydralazine 50 mg every 8 hours  Hyperlipidemia  Lipid lowering medication PTA:  Lipitor 20 mg daily  LDL 174, goal < 70  Now on Lipitor 80 mg daily  Continue statin at discharge  Diabetes  Glucoses  100-120s  HgbA1c  11.4, goal < 7.0  Lantus 15U bid   Basal NovoLog to 3U 4 times a day with Vital AF feeding  SSI  QAC+QHS  CBG monitoring QAC+QHS  Hypernatremia -> hyponatremia-> hypernatremia->normal  Na 143->145->143->139->140 -> 137  On vital AF 4 times a day  On free water 150cc Q4h. Consider decreasing frequency of administration.  Dysphagia s/p PEG complicated by abdominal wall abscess, resolved  PEG done 08/18/2018  Removed and replaced dislodged PEG 08/18/2018  CT showed Abdominal wall abscess - s/p open surgical drainage 08/14/2018 persistent foul odor discharge. Reexploration 09/18/2018  Wound debrided appropriately and sutures removed.  No evisceration  On vital AF 4 times a day with supplement  Trauma surgery got sutures out of PEG last week  On on dysphagia 1 diet with nectar thick liquid - eating  12/24 - new abdominal pain during the night. Seen by trauma. CT abd/pelvis unremarkable.  No surgical, acute issues. They signed off.  Atypical chest pain, resolved  sternal chest pain 10/19/18 after bouts of coughing.   Tele no changes.   Troponin < 0.03.   CXR negative.   Electrolytes WNL  Chest pain since resolved.   Other Stroke Risk Factors  Morbid Obesity, Body mass index is 45.41 kg/m., recommend weight loss, diet and exercise as appropriate   Other Active Problems  Thrombocytosis - likely due to anemia, [phone]  Oral thrush on nystatin mouthwash.  Frequent mouth care  CBC and BMP - glucose a little high. Otherwise not much change. Pt has SSI but it looks like glucose has been on the high side. Will increase insulin slightly. Increase hydralazine for BP to 50 mg Q 6 hrs. SBP goal 130 - 160. K pad prn for left arm.  Hospital day # [address] PA-C [facility] Pager [phone] 11/02/2018, 1:03 PM

## 2018-11-03 DIAGNOSIS — M25522 Pain in left elbow: Secondary | ICD-10-CM

## 2018-11-03 LAB — GLUCOSE, CAPILLARY
GLUCOSE-CAPILLARY: 112 mg/dL — AB (ref 70–99)
Glucose-Capillary: 125 mg/dL — ABNORMAL HIGH (ref 70–99)
Glucose-Capillary: 176 mg/dL — ABNORMAL HIGH (ref 70–99)
Glucose-Capillary: 165 mg/dL — ABNORMAL HIGH (ref 70–99)
Glucose-Capillary: 193 mg/dL — ABNORMAL HIGH (ref 70–99)

## 2018-11-03 NOTE — Progress Notes (Addendum)
STROKE TEAM PROGRESS NOTE   SUBJECTIVE (INTERVAL HISTORY) No family members present. Pt reports gen feeling unwell today with left arm pain, low grade fever noted, no increase in WBC, will ck UE [place] for DVT. Spoke with pts nurse and case mgt for any futher d.c updates  OBJECTIVE Vitals:   11/03/18 0749 11/03/18 0827 11/03/18 1118 11/03/18 1154  BP: (!) 163/88  139/68   Pulse: 83 83 77 78  Resp: 20 18  18   Temp: 98.9 F (37.2 C)  98.9 F (37.2 C)   TempSrc: Oral  Axillary   SpO2: 99%  96% 98%  Weight:      Height:        CBC:  Recent Labs  Lab 11/02/18 0539  WBC 10.2  HGB 9.7*  HCT 32.9*  MCV 85.0  PLT 056    Basic Metabolic Panel:  Recent Labs  Lab 11/02/18 0539  NA 139  K 4.0  CL 104  CO2 25  GLUCOSE 142*  BUN 29*  CREATININE 0.66  CALCIUM 11.4*    IMAGING No results found.    PHYSICAL EXAM:  General -  Obese middle-aged African-American lady, on trach collar with speaking valve, not in distress. Her abd is soft, nondistended and nontender. PEG tube in place.  Cardiovascular - regular rate and rhythm Neurological Exam - on trach collar, eyes open, awake, alert. No ptosis or EOMI, denies diplopia.  PERRL. Bilateral facial weakness, tongue protrusion weak. Able to wiggle left hand fingers and left foot toes. 2-/5 LLE proximal and 2/5 distally and 2/5 LUE. Follows commands and good eye contact.  Speech is moderate dysarthric with speaking valve. No aphasia. Sensation intact to touch, cold. gait not tested.    ASSESSMENT/PLAN Andrea [name] is a 57 y.o. female with history of difficult to control hypertension, insulin-dependent diabetes, obesity, hypothyroidism status post ablation presenting with chest discomfort, right-sided weakness, slurred speech and right facial droop. She did not receive IV t-PA due to late presentation. S/P stent assisted angioplasty of distal basilar artery.  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 Worsening symptoms with extension of pontine/medullary infarcts with new small bilateral cerebellar infarcts without evidence of stent re-stenosis or occlusion  Remains stable  VTE prophylaxis - heparin subq  on aspirin 81 mg daily PTA. Now on Brilinta 90 mg bid and ASA 81 mg daily.   Therapy recommendations:  SNF  Disposition:  SNF.   Medically ready for d/c  D/c paperwork completed 10/14/18  Abnormal uterine bleeding, stable  Has been following with GYN  Was on megace in the past  CT abd/pelvis 09/03/18 - enlarged fibroid uterus  continue megace 80mg  bid  Continue ASA and brilinta for now  OBGYN recs appreciated  Received prmarin 25mg  IV once - vaginal bleeding much improved  Anemia, resolved  Hb 6.8->PRBC->7.1->10.3  Likely due to iron deficiency, heavy periods and acute uterine bleeding and abdominal wall surgeries  Iron panel showed iron deficiency  On iron solutions  PRBC transfusion 1 U 09/15/2018 and 2U 09/07/18 and 2U 09/21/18  Intracranial stenosis  BA mid to distal severe stenosis s/p BA stenting  Left PCA severe stenosis, R PCA moderate stenosis  Left MCA moderate stenosis  Uncontrolled stroke risk factors with HLD, HTN, DM  Non compliance with meds at home  Respiratory failure  S/p trach 08/19/2018  Able to speak with [name] valve although dysarthric  Per CCM will continue trach d/t weak  cough, secretions, plugging  Maintain trach collar  Maintain current trach type and size  CCM to continue to follow  Follow up at d.c in the trach clinic  Hypertension  On the high end  BP goal 130-150  On Coreg 25 bid, amlodipine and hydralazine  Increased hydralazine 50 mg every 8 hours  Hyperlipidemia  Lipid lowering medication PTA:  Lipitor 20 mg daily  LDL 174, goal < 70  Now on Lipitor 80 mg daily  Continue statin at discharge  Diabetes  Glucoses  100-120s  HgbA1c 11.4, goal < 7.0  Lantus  BID  Basal NovoLog to 3U 4 times a day with Vital AF feeding  SSI  QAC+QHS  CBG monitoring QAC+QHS  Hypernatremia -> hyponatremia-> hypernatremia->normal  Na 143->145->143->139->140 -> 137  On vital AF 4 times a day  On free water 150cc Q4h. Consider decreasing frequency of administration.  Dysphagia s/p PEG complicated by abdominal wall abscess, resolved  PEG done 09/03/2018  Removed and replaced dislodged PEG 08/21/2018  CT showed Abdominal wall abscess - s/p open surgical drainage 08/23/2018 persistent foul odor discharge. Reexploration 09/18/2018  Wound debrided appropriately and sutures removed.  No evisceration  On vital AF 4 times a day with supplement  Trauma surgery got sutures out of PEG last week  On on dysphagia 1 diet with nectar thick liquid - eating, but still needs TF to augment nutrition  12/24 - new abdominal pain during the night. Seen by trauma. CT abd/pelvis unremarkable.  No surgical, acute issues. They signed off.  Atypical chest pain, resolved  sternal chest pain 10/19/18 after bouts of coughing.   Tele no changes.   Troponin < 0.03.   CXR negative.   Electrolytes WNL  Chest pain since resolved.   Other Stroke Risk Factors  Morbid Obesity, Body mass index is 45.41 kg/m., recommend weight loss, diet and exercise as appropriate   Other Active Problems  Thrombocytosis - likely due to anemia, [phone]  Oral thrush on nystatin mouthwash.  Frequent mouth care  Left arm pain. Pain mgt, k pad. No inc in WBC, but low gr fever noted. Will order [place] to r/o DVT  Hospital day # [place], ARNP-C, ANVP-BC [facility] 11/03/2018, 2:50 PM  ATTENDING NOTE: I reviewed above note and agree with the assessment and plan. Pt was seen and examined.   Patient lying in bed, complained of left arm pain earlier before rounding.  However, during rounding, patient stated no pain on the left arm after taking pain medications.   However, complains of lower back pain but mild.  Left arm muscle strength improved over time, no evidence of fracture or dislocation.  Will check left arm venous Doppler to rule out DVT.  Left lower extremity also had improved muscle strength done before, however still not able to move against gravity.  Will need to discuss with speech therapy regarding her strength of coughing which may decide the possibility of trach removal.  Social worker still working on placement.  Rosalin Hawking, MD PhD Stroke Neurology 11/03/2018 5:17 PM

## 2018-11-03 NOTE — Progress Notes (Signed)
   NAME:  Andrea [name], MRN:  [id number], DOB:  [birth year], LOS: 81 ADMISSION DATE:  08/22/2018,  Interim history/subjective:  57 year old woman status post pontine stroke with ongoing swallowing dysfunction requiring tracheostomy.  She continues to have copious secretion and reports that her cough is still fairly weak.  Objective   Blood pressure 139/68, pulse 78, temperature 98.9 F (37.2 C), temperature source Axillary, resp. rate 18, height 5\' 4"  (1.626 m), weight 120 kg, last menstrual period 07/26/2018, SpO2 98 %.    FiO2 (%):  [28 %] 28 %   Intake/Output Summary (Last 24 hours) at 11/03/2018 1317 Last data filed at 11/03/2018 0500 Gross per 24 hour  Intake 420 ml  Output 950 ml  Net -530 ml   Filed Weights   10/21/18 0356 10/23/18 0609 10/25/18 0413  Weight: 114 kg 121 kg 120 kg    Examination: Physical Exam  Constitutional:  Mild increase work of breathing  HENT:  Tracheostomy site intact yellow-tan sputum present.  Cardiovascular: Normal rate.  Respiratory: Breath sounds normal.      Assessment & Plan:  Remains tracheostomy dependent for pulmonary toilet.  Continue with uncuffed #4 Shiley.  Continue working with Passy-Muir valve.  We will continue to reassess her progress intermittently.   Kipp Brood, MD [facility] ICU Physician [place]  Pager: [phone] Mobile: [phone] After hours: [phone].  11/03/2018, 1:17 PM

## 2018-11-04 ENCOUNTER — Inpatient Hospital Stay (HOSPITAL_COMMUNITY): Payer: Medicaid Other

## 2018-11-04 DIAGNOSIS — M7989 Other specified soft tissue disorders: Secondary | ICD-10-CM

## 2018-11-04 LAB — GLUCOSE, CAPILLARY
Glucose-Capillary: 129 mg/dL — ABNORMAL HIGH (ref 70–99)
Glucose-Capillary: 128 mg/dL — ABNORMAL HIGH (ref 70–99)
Glucose-Capillary: 123 mg/dL — ABNORMAL HIGH (ref 70–99)
Glucose-Capillary: 137 mg/dL — ABNORMAL HIGH (ref 70–99)

## 2018-11-04 MED ORDER — MUSCLE RUB 10-15 % EX CREA
TOPICAL_CREAM | CUTANEOUS | Status: DC | PRN
  Filled 2018-11-04: qty 85

## 2018-11-04 NOTE — Progress Notes (Signed)
Nutrition Follow-up  DOCUMENTATION CODES:   Morbid obesity  INTERVENTION:  Discontinue Ensure PO due to poor tolerance.  Let pt attempt to eat at meals, if po intake at a meal is <50% then provide a bolus tube feed via PEG using Vital AF 1.2 formula at volume of 237 ml up to TID.   Additionally provide a bolus feed HS daily.   Provide30 ml Prostat(or equivalent)TIDper tube.   Continue free water flushes of243m QIDbetween bolus feeds. (MD to adjust as appropriate)  If all four bolus tube feeds are given, regimen to provide 1438 kcal (90% of kcal needs), 116 grams of protein (97% of protein needs), and 1568 ml water.   NUTRITION DIAGNOSIS:   Inadequate oral intake related to inability to eat as evidenced by NPO status; diet advanced; improving  GOAL:   Patient will meet greater than or equal to 90% of their needs; met  MONITOR:   PO intake, Supplement acceptance, Labs, Skin, I & O's, Weight trends, TF tolerance  REASON FOR ASSESSMENT:   Consult Enteral/tube feeding initiation and management  ASSESSMENT:   Ms. RSwordsis a 57yo female with PMH of type 2 diabetes, HTN, CKD III, obesity, and anemia admitted for chest pain. Code Stroke called 10/10 in AM. 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 Intubated 10/10 in ICU.  10/21 trach and PEG.11/1 ex lap, I&D.  Pt continues on a dysphagia 1 diet with nectar thick liquids. Meal completion has been varied from 20-75%. Per RN, pt po intake has improved today with 50-75%. Emphasized tube feeding orders to provide a bolus tube feed only if meal completion is <50%. Pt dislikes Ensure. RD to discontinue Ensure order. Will continue with tube feeding orders currently until meal completion consistently >50% for consecutive days. RD to continue to monitor.    Labs and medications reviewed.   Diet Order:   Diet Order            DIET - DYS 1 Room service appropriate? Yes; Fluid consistency: Nectar Thick  Diet effective now               EDUCATION NEEDS:   Not appropriate for education at this time  Skin:  Skin Assessment: Skin Integrity Issues: Skin Integrity Issues:: Stage III, Incisions Stage II: sacrum Incisions: abdomen  Last BM:  12/30  Height:   Ht Readings from Last 1 Encounters:  09/03/18 5' 4" (1.626 m)    Weight:   Wt Readings from Last 1 Encounters:  10/25/18 120 kg    Ideal Body Weight:  54.5 kg  BMI:  Body mass index is 45.41 kg/m.  Estimated Nutritional Needs:   Kcal:  1600-1800  Protein:  120-135 grams  Fluid:  > 1.6 L    Andrea Parker MS, RD, LDN Pager # [phone] hours/ weekend pager # [phone]

## 2018-11-04 NOTE — Progress Notes (Signed)
*  Preliminary Results* Left upper extremity venous duplex completed. Left upper extremity is negative for deep and positive superficial vein thrombosis in the Cephalic vein.  11/04/2018 2:53 PM  Joline Encalada Dawna Part

## 2018-11-04 NOTE — Progress Notes (Signed)
Occupational Therapy Treatment Patient Details Name: Andrea [name] MRN: [id number] DOB: Dec 02, 1960 Today's Date: 11/04/2018    History of present illness Pt is a 57 y/o female who presented to ED with chest pain noted Rt hemiparesis in ED with acute Left paramedian brainstem infarct. Intubated 10/10 for angioplasty, extubated post procedure and reintubated. Repeat MRI with brainstem infarct extension and bil cerebellar infarcts. Peg/trach with return to vent 08/13/2018. 10/22-29 on trach collar, return to vent 10/29. 10/28 I & D of abdominal wall abscess with placement of penrose drain and G-tube, 11/1 repeat ex lap. 11/7 return to trach collar.  PMHx: HTN, DM, CKD   OT comments  Pt with R UE superficial thrombosis in cephalic vein. Received clearance from Dr. Erlinda Hong for UR ROM. Focus of session on BUE P/AA/AROM with pt's grand daughter. Also educated grand daughter on importance of positioning, especially for RUE and use of retrograde massage to reduce edema. Pettus daughter verbalized understanding. Encouraged nsg to use Maximove to get pt OOB to recliner daily if possible. Will continue to follow acutely.   Follow Up Recommendations  SNF;Supervision/Assistance - 24 hour    Equipment Recommendations  Other (comment)    Recommendations for Other Services      Precautions / Restrictions Precautions Precautions: Fall Precaution Comments: Peg/trach       Mobility Bed Mobility                  Transfers                      Balance                                           ADL either performed or assessed with clinical judgement   ADL   Eating/Feeding: Moderate assistance   Grooming: Wash/dry face;Oral care;Moderate assistance                                       Vision       Perception     Praxis      Cognition Arousal/Alertness: Awake/alert Behavior During Therapy: WFL for tasks assessed/performed Overall  Cognitive Status: Impaired/Different from baseline                                          Exercises Exercises: Other exercises General Exercises - Upper Extremity Shoulder Flexion: AAROM;PROM;Both;10 reps;Supine Shoulder ABduction: AAROM;PROM;Both;10 reps;Supine Shoulder ADduction: PROM;Right;AAROM;Left;10 reps;Seated Shoulder Horizontal ABduction: PROM;AROM;Both;10 reps;Supine Shoulder Horizontal ADduction: AAROM;PROM;Both;10 reps;Supine Elbow Flexion: AAROM;PROM;Both;10 reps;Supine Elbow Extension: AAROM;PROM;Both;10 reps;Supine Wrist Flexion: PROM;Right;AAROM;Left;10 reps Wrist Extension: PROM;Right;AAROM;Left;15 reps;Supine Digit Composite Flexion: PROM;Right;AAROM;Left;10 reps;Supine Other Exercises Other Exercises: taught grand daughter RUE PROM; LUE P/AA and AROM. will benefit form further education on shoulder ROM/exercise due to pain/tightness Other Exercises: Educated grand daughter on retrograde massage adn positioning to reduce R hand edema   Shoulder Instructions       General Comments      Pertinent Vitals/ Pain       Pain Assessment: Faces Faces Pain Scale: Hurts little more Pain Location: BUE with ROM Pain Descriptors / Indicators: Grimacing;Guarding Pain Intervention(s): Limited activity within patient's tolerance  Home Living  Prior Functioning/Environment              Frequency  Min 2X/week        Progress Toward Goals  OT Goals(current goals can now be found in the care plan section)  Progress towards OT goals: Progressing toward goals;OT to reassess next treatment  Acute Rehab OT Goals Patient Stated Goal: to be able to help feed myself OT Goal Formulation: With patient/family Time For Goal Achievement: 11/11/18 Potential to Achieve Goals: Fair ADL Goals Pt Will Perform Eating: with mod assist;with adaptive utensils;with assist to don/doff  brace/orthosis;sitting Pt Will Perform Grooming: with max assist;bed level Pt Will Perform Upper Body Bathing: with max assist;bed level Pt/caregiver will Perform Home Exercise Program: Increased ROM;With written HEP provided;Both right and left upper extremity Additional ADL Goal #1: Pt will sit EOB with +2 Max A x 10 min in preparation for functional activities. Additional ADL Goal #2: Pt will consistently use soft touch call bell 4/4 trials to increase ability to manage self care. Additional ADL Goal #3: Pt will tolerate 2 hrs out of bed in whellchair to increase activity tolerance. Additional ADL Goal #4: Pt will dmeonstrate to pick up washcloth with L hand to increase functional use L UE for ADL  Plan Discharge plan remains appropriate;Frequency remains appropriate    Co-evaluation                 AM-PAC OT "6 Clicks" Daily Activity     Outcome Measure   Help from another person eating meals?: A Lot Help from another person taking care of personal grooming?: A Lot Help from another person toileting, which includes using toliet, bedpan, or urinal?: Total Help from another person bathing (including washing, rinsing, drying)?: Total Help from another person to put on and taking off regular upper body clothing?: Total Help from another person to put on and taking off regular lower body clothing?: Total 6 Click Score: 8    End of Session Equipment Utilized During Treatment: Oxygen  OT Visit Diagnosis: Other abnormalities of gait and mobility (R26.89);Muscle weakness (generalized) (M62.81);Low vision, both eyes (H54.2);Feeding difficulties (R63.3);Other symptoms and signs involving cognitive function;Hemiplegia and hemiparesis Hemiplegia - Right/Left: Right Hemiplegia - dominant/non-dominant: Non-Dominant Hemiplegia - caused by: Nontraumatic intracerebral hemorrhage;Cerebral infarction   Activity Tolerance Patient tolerated treatment well   Patient Left in bed;with call  bell/phone within reach;with family/visitor present   Nurse Communication Mobility status(encourage pt OOB daily with maximove)        Time: 9892-1194 OT Time Calculation (min): 15 min  Charges: OT General Charges $OT Visit: 1 Visit OT Treatments $Therapeutic Exercise: 8-22 mins  Maurie Boettcher, OT/L   Acute OT Clinical Specialist [place] Pager [phone] Office [phone]    [facility] 11/04/2018, 5:43 PM

## 2018-11-04 NOTE — Progress Notes (Signed)
STROKE TEAM PROGRESS NOTE   SUBJECTIVE (INTERVAL HISTORY) Today patient complaining of bilateral shoulder pain.  She feels it is deep and I cannot touch it with examination.  We will follow-up right upper extremity Doppler pending from yesterday.  We will add muscle rub for patient comfort.  No other complaints.  OBJECTIVE Vitals:   11/04/18 0036 11/04/18 0353 11/04/18 0402 11/04/18 0821  BP:  (!) 163/87  (!) 169/86  Pulse:  72 76 76  Resp: 20 (!) 22 18 18   Temp:  98.8 F (37.1 C)  98 F (36.7 C)  TempSrc:  Oral  Oral  SpO2:  96% 97% 100%  Weight:      Height:        CBC:  Recent Labs  Lab 11/02/18 0539  WBC 10.2  HGB 9.7*  HCT 32.9*  MCV 85.0  PLT 315    Basic Metabolic Panel:  Recent Labs  Lab 11/02/18 0539  NA 139  K 4.0  CL 104  CO2 25  GLUCOSE 142*  BUN 29*  CREATININE 0.66  CALCIUM 11.4*    IMAGING No results found.   Upper extremity Doppler Pending   PHYSICAL EXAM:  General -  Obese middle-aged African-American lady, on trach collar with speaking valve, not in distress. Her abd is soft, nondistended and nontender. PEG tube in place. No elicited discomfort to shoulder evaluation, ROM. Cardiovascular - regular rate and rhythm Neurological Exam - on trach collar, eyes open, awake, alert. No ptosis or EOMI, denies diplopia.  PERRL. Bilateral facial weakness, tongue protrusion weak. Able to wiggle left hand fingers and left foot toes. 2-/5 LLE proximal and 2/5 distally and 2/5 LUE.  Right-sided hemiplegia.  Follows commands and good eye contact.  Speech is moderate dysarthric with speaking valve. No aphasia. Sensation intact to touch, cold. Gait not tested.    ASSESSMENT/PLAN [name] is a 57 y.o. female with history of difficult to control hypertension, insulin-dependent diabetes, obesity, hypothyroidism status post ablation presenting with chest discomfort, right-sided weakness, slurred speech and right facial droop. She did not receive IV  t-PA due to late presentation. S/P stent assisted angioplasty of distal basilar artery.  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 Worsening symptoms with extension of pontine/medullary infarcts with new small bilateral cerebellar infarcts without evidence of stent re-stenosis or occlusion  Remains stable  VTE prophylaxis - heparin subq  on aspirin 81 mg daily PTA. Now on Brilinta 90 mg bid and ASA 81 mg daily.   Therapy recommendations:  SNF  Disposition:  SNF  Medically ready for d/c  D/c paperwork completed 10/14/18  Abnormal uterine bleeding, stable  Has been following with GYN  Was on megace in the past  CT abd/pelvis 09/03/18 - enlarged fibroid uterus  continue megace 80mg  bid  Continue ASA and brilinta   OBGYN recs   Received prmarin 25mg  IV once - vaginal bleeding much improved  Anemia, resolved  Hb 6.8->PRBC->7.1->10.3->9.7  Likely due to iron deficiency, heavy periods and acute uterine bleeding and abdominal wall surgeries  Iron panel showed iron deficiency  On iron solutions  PRBC transfusion 1 U 10/02/2018 and 2U 09/07/18 and 2U 09/21/18  Intracranial stenosis  BA mid to distal severe stenosis s/p BA stenting  Left PCA severe stenosis, R PCA moderate stenosis  Left MCA moderate stenosis  Uncontrolled stroke risk factors with HLD, HTN, DM  Non compliance with meds at home  Respiratory failure  S/p trach  08/10/2018  Able to speak with [name] valve although dysarthric  Per CCM will continue trach d/t weak cough, secretions, plugging  Maintain trach collar  Maintain current trach type and size (uncuffed Shiley #4)  CCM following  Follow up at d.c in the trach clinic  Hypertension  On the high end  BP goal 130-160s  On Coreg 25 bid, amlodipine 10 and hydralazine 50 mg every 8 hours  Hyperlipidemia  Lipid lowering medication PTA:  Lipitor 20 mg daily  LDL 174, goal < 70  Now  on Lipitor 80 mg daily  Continue statin at discharge  Diabetes  Glucoses  128-193, higher end  HgbA1c 11.4, goal < 7.0  Lantus 17u BID  Basal NovoLog to 3U 4 times a day with Vital AF feeding - this was d/c'd 12/26 at 2234 - ? Resume with glucoses higher since that time - will discuss with MD  SSI  QAC+QHS  CBG monitoring QAC+QHS  Hypernatremia -> hyponatremia-> hypernatremia->normal  Na 139  On vital AF 4 times a day  On free water 200cc qid  Dysphagia s/p PEG complicated by abdominal wall abscess, resolved  PEG done 08/18/2018  Removed and replaced dislodged PEG 08/10/2018  CT showed Abdominal wall abscess - s/p open surgical drainage 08/18/2018 persistent foul odor discharge. Reexploration 10/01/2018  Wound debrided appropriately and sutures removed.  No evisceration  On vital AF 4 times a day with supplement  Trauma surgery got sutures out of PEG last week  On on dysphagia 1 diet with nectar thick liquid - eating, but still needs TF to augment nutrition  12/24 - new abdominal pain during the night. Seen by trauma. CT abd/pelvis unremarkable.  No surgical, acute issues. They signed off.  Atypical chest pain, resolved  sternal chest pain 10/19/18 after bouts of coughing.   Tele no changes.   Troponin < 0.03.   CXR negative.   Electrolytes WNL  Chest pain since resolved.   Other Stroke Risk Factors  Morbid Obesity, Body mass index is 45.41 kg/m., recommend weight loss, diet and exercise as appropriate   Other Active Problems  Thrombocytosis, resolved - likely due to anemia, 385  Oral thrush, resolved on nystatin. Continue frequent mouth care  Left arm pain. B shoulder pain. Pain mgt, k pad. No inc in WBC, TM 98.9. [place] to r/o DVT pending. Muscle rub added to pain regimen  Hospital day # [place], MSN, APRN, [employer], AGPCNP-BC Advanced Practice Stroke Nurse [place] for Schedule & Pager information 11/04/2018 1:57 PM

## 2018-11-05 LAB — GLUCOSE, CAPILLARY
Glucose-Capillary: 99 mg/dL (ref 70–99)
Glucose-Capillary: 156 mg/dL — ABNORMAL HIGH (ref 70–99)
Glucose-Capillary: 150 mg/dL — ABNORMAL HIGH (ref 70–99)
Glucose-Capillary: 113 mg/dL — ABNORMAL HIGH (ref 70–99)

## 2018-11-05 NOTE — Progress Notes (Signed)
STROKE TEAM PROGRESS NOTE   SUBJECTIVE (INTERVAL HISTORY) No complaints today. Ready to eat.  Understands about recent superficial venous clot noted in her arm and undergoing aspirin treatment.  OBJECTIVE Vitals:   11/04/18 2330 11/05/18 0339 11/05/18 0757 11/05/18 0948  BP: (!) 155/72 (!) 155/76  (!) 166/80  Pulse: 75 72 79 77  Resp: 20 20 18 18   Temp: 99.1 F (37.3 C) 99.4 F (37.4 C)  99 F (37.2 C)  TempSrc: Oral Oral  Oral  SpO2: 100% 100% 100% 99%  Weight:      Height:        CBC:  Recent Labs  Lab 11/02/18 0539  WBC 10.2  HGB 9.7*  HCT 32.9*  MCV 85.0  PLT 546    Basic Metabolic Panel:  Recent Labs  Lab 11/02/18 0539  NA 139  K 4.0  CL 104  CO2 25  GLUCOSE 142*  BUN 29*  CREATININE 0.66  CALCIUM 11.4*    IMAGING Vas [place] Upper Extremity Venous Duplex  Result Date: 11/04/2018 UPPER VENOUS STUDY  Indications: Swelling Performing Technologist: [name] RVS  Examination Guidelines: A complete evaluation includes B-mode imaging, spectral Doppler, color Doppler, and power Doppler as needed of all accessible portions of each vessel. Bilateral testing is considered an integral part of a complete examination. Limited examinations for reoccurring indications may be performed as noted.  Right Findings: +----------+------------+----------+---------+-----------+-------+ RIGHT     CompressiblePropertiesPhasicitySpontaneousSummary +----------+------------+----------+---------+-----------+-------+ Subclavian                         Yes       Yes            +----------+------------+----------+---------+-----------+-------+  Left Findings: +----------+------------+----------+---------+-----------+-------+ LEFT      CompressiblePropertiesPhasicitySpontaneousSummary +----------+------------+----------+---------+-----------+-------+ IJV           Full                 Yes       Yes             +----------+------------+----------+---------+-----------+-------+ Subclavian    Full                 Yes       Yes            +----------+------------+----------+---------+-----------+-------+ Axillary      Full                 Yes       Yes            +----------+------------+----------+---------+-----------+-------+ Brachial      Full                 Yes       Yes            +----------+------------+----------+---------+-----------+-------+ Radial        Full                                          +----------+------------+----------+---------+-----------+-------+ Ulnar         Full                                          +----------+------------+----------+---------+-----------+-------+ Cephalic      None  Acute  +----------+------------+----------+---------+-----------+-------+ Basilic       Full                                          +----------+------------+----------+---------+-----------+-------+  Summary:  Right: No evidence of thrombosis in the subclavian.  Left: No evidence of deep vein thrombosis in the upper extremity. Findings consistent with acute superficial vein thrombosis involving the left cephalic vein.  *See table(s) above for measurements and observations.  Diagnosing physician: Servando Snare MD Electronically signed by Servando Snare MD on 11/04/2018 at 5:11:05 PM.    Final      Left Upper extremity Doppler Left upper extremity is negative for deep and positive superficial vein thrombosis in the Cephalic vein.   PHYSICAL EXAM -no change in exam from yesterday General -  Obese middle-aged African-American lady, on trach collar with speaking valve, not in distress. Her abd is soft, nondistended and nontender. PEG tube in place. No elicited discomfort to shoulder evaluation, ROM. Cardiovascular - regular rate and rhythm Neurological Exam - on trach collar, eyes open, awake, alert. No ptosis or EOMI,  denies diplopia.  PERRL. Bilateral facial weakness, tongue protrusion weak. Able to wiggle left hand fingers and left foot toes. 2-/5 LLE proximal and 2/5 distally and 2/5 LUE.  Right-sided hemiplegia.  Follows commands and good eye contact.  Speech is moderate dysarthric with speaking valve. No aphasia. Sensation intact to touch, cold. Gait not tested.    ASSESSMENT/PLAN Andrea [name] is a 58 y.o. female with history of difficult to control hypertension, insulin-dependent diabetes, obesity, hypothyroidism status post ablation presenting with chest discomfort, right-sided weakness, slurred speech and right facial droop. She did not receive IV t-PA due to late presentation. S/P stent assisted angioplasty of distal basilar artery.  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 Worsening symptoms with extension of pontine/medullary infarcts with new small bilateral cerebellar infarcts without evidence of stent re-stenosis or occlusion  Remains stable  VTE prophylaxis - heparin subq  on aspirin 81 mg daily PTA. Now on Brilinta 90 mg bid and ASA 81 mg daily.   Therapy recommendations:  SNF  Disposition:  SNF  Medically ready for d/c  D/c paperwork completed 10/14/18  Abnormal uterine bleeding, stable  Has been following with GYN  Was on megace in the past  CT abd/pelvis 09/03/18 - enlarged fibroid uterus  continue megace 80mg  bid  Continue ASA and brilinta   OBGYN recs   Received prmarin 25mg  IV once - vaginal bleeding much improved  Anemia, resolved  Hb 6.8->PRBC->7.1->10.3->9.7  Likely due to iron deficiency, heavy periods and acute uterine bleeding and abdominal wall surgeries  Iron panel showed iron deficiency  On iron solutions  PRBC transfusion 1 U 09/27/2018 and 2U 09/07/18 and 2U 09/21/18  Intracranial stenosis  BA mid to distal severe stenosis s/p BA stenting  Left PCA severe stenosis, R PCA moderate  stenosis  Left MCA moderate stenosis  Uncontrolled stroke risk factors with HLD, HTN, DM  Non compliance with meds at home  Respiratory failure  S/p trach 08/17/2018  Able to speak with [name] valve although dysarthric  Per CCM will continue trach d/t weak cough, secretions, plugging  Maintain trach collar  Maintain current trach type and size (uncuffed Shiley #4)  CCM following  Follow up at d.c in the trach clinic  Hypertension  On the high end  BP goal 130-160s  On Coreg 25 bid, amlodipine 10 and hydralazine 50 mg every 8 hours  Hyperlipidemia  Lipid lowering medication PTA:  Lipitor 20 mg daily  LDL 174, goal < 70  Now on Lipitor 80 mg daily  Continue statin at discharge  Diabetes  HgbA1c 11.4, goal < 7.0  Lantus 17u BID  Basal NovoLog to 3U 4 times a day with Vital AF feeding - this was d/c'd 12/26 at 2234 - continue to hold for now d/t normalized glucoses  SSI  QAC+QHS  CBG monitoring QAC+QHS  Hypernatremia -> hyponatremia-> hypernatremia->normal  Na 139  On vital AF 4 times a day  On free water 200cc qid  Dysphagia s/p PEG complicated by abdominal wall abscess, resolved  PEG done 08/07/2018  Removed and replaced dislodged PEG 08/07/2018  CT showed Abdominal wall abscess - s/p open surgical drainage 08/08/2018 persistent foul odor discharge. Reexploration 09/07/2018  Wound debrided appropriately and sutures removed.  No evisceration  On vital AF 4 times a day with supplement  Trauma surgery got sutures out of PEG last week  On on dysphagia 1 diet with nectar thick liquid - eating, but still needs TF to augment nutrition  12/24 - new abdominal pain during the night. Seen by trauma. CT abd/pelvis unremarkable.  No surgical, acute issues. They signed off.  Atypical chest pain, resolved  sternal chest pain 10/19/18 after bouts of coughing.   Tele no changes.   Troponin < 0.03.   CXR negative.   Electrolytes WNL  Chest pain since  resolved.   Other Stroke Risk Factors  Morbid Obesity, Body mass index is 45.41 kg/m., recommend weight loss, diet and exercise as appropriate   Other Active Problems  Thrombocytosis, resolved - likely due to anemia, 385  Oral thrush, resolved on nystatin. Continue frequent mouth care  Left arm pain. B shoulder pain. Pain mgt, k pad. No inc in WBC, TM 99.4. [place] with positive superficial vein thrombosis in the L Cephalic vein. [name] with ASA. AC not indication. Continue. Muscle rub as added to pain regimen  Plan to repeat lab work on Friday  Hospital day # [place], MSN, APRN, [employer], AGPCNP-BC Advanced Practice Stroke Nurse [place] for Schedule & Pager information 11/05/2018 10:54 AM

## 2018-11-05 NOTE — Social Work (Signed)
CSW continuing to follow for support with disposition. Discussed pt in [facility]. CSW Surveyor, quantity requests referral be made again to [place].  Westley Hummer, MSW, [place] Work [phone]

## 2018-11-06 LAB — GLUCOSE, CAPILLARY
GLUCOSE-CAPILLARY: 114 mg/dL — AB (ref 70–99)
Glucose-Capillary: 99 mg/dL (ref 70–99)
Glucose-Capillary: 151 mg/dL — ABNORMAL HIGH (ref 70–99)
Glucose-Capillary: 163 mg/dL — ABNORMAL HIGH (ref 70–99)

## 2018-11-06 MED ORDER — HYDRALAZINE HCL 50 MG PO TABS
75.0000 mg | ORAL_TABLET | Freq: Four times a day (QID) | ORAL | Status: DC
  Administered 2018-11-06 – 2018-11-13 (×24): 75 mg
  Filled 2018-11-06 (×24): qty 1

## 2018-11-06 NOTE — Progress Notes (Signed)
  Speech Language Pathology Treatment: Dysphagia  Patient Details Name: Andrea [name] MRN: [id number] DOB: 03-25-1961 Today's Date: 11/06/2018 Time: 8144-8185 SLP Time Calculation (min) (ACUTE ONLY): 73 min  Assessment / Plan / Recommendation Clinical Impression  PMV placed.  Pt achieving low volume, generally intelligible speech with intermittent verbal cues required for deep breath prior to talking.  Pt consumed trials of cracker/soft solids with adequate mastication, no overt s/s of aspiration with combination of solids and nectar thick liquids.  Afebrile; BS diminished, rhonchi.  Recommend advancing diet to dysphagia 2/nectars with meds crushed in puree.  Pt continues to require assistance with feeding, HOB upright; straws ok.     HPI HPI: 58 yo presented to ED with chest pain noted Rt hemiparesis in ED with acute Left paramedian brainstem infarct. Intubated 10/10 for angioplasty, extubated post procedure and reintubated. Repeat MRI with brainstem infarct extension and bil cerebellar infarcts. Peg/trach 08/30/2018. FEES 09/15/18, 09/24/18, 12/9.  Dysphagia 1 diet with nectar-thick liquids initiated after last FEES.  PMHx: HTN, DM, CKD      SLP Plan  Continue with current plan of care       Recommendations  Diet recommendations: Dysphagia 2 (fine chop);Nectar-thick liquid Liquids provided via: Straw Medication Administration: Crushed with puree Supervision: Full supervision/cueing for compensatory strategies Compensations: Minimize environmental distractions;Small sips/bites Postural Changes and/or Swallow Maneuvers: Seated upright 90 degrees;Upright 30-60 min after meal      Patient may use Passy-Muir Speech Valve: During all therapies with supervision;Intermittently with supervision;During PO intake/meals PMSV Supervision: Full         Follow up Recommendations: Skilled Nursing facility SLP Visit Diagnosis: Dysphagia, oropharyngeal phase (R13.12) Plan: Continue with current  plan of care       GO                [name] 11/06/2018, 11:21 AM  Estill Bamberg L. Tivis Ringer, [place] Office number [phone] Pager [phone]

## 2018-11-06 NOTE — Progress Notes (Signed)
Physical Therapy Treatment Patient Details Name: Andrea [name] MRN: [id number] DOB: 1961/04/17 Today's Date: 11/06/2018    History of Present Illness Pt is a 58 y/o female who presented to ED with chest pain noted Rt hemiparesis in ED with acute Left paramedian brainstem infarct. Intubated 10/10 for angioplasty, extubated post procedure and reintubated. Repeat MRI with brainstem infarct extension and bil cerebellar infarcts. Peg/trach with return to vent 08/12/2018. 10/22-29 on trach collar, return to vent 10/29. 10/28 I & D of abdominal wall abscess with placement of penrose drain and G-tube, 11/1 repeat ex lap. 11/7 return to trach collar.  PMHx: HTN, DM, CKD    PT Comments    Focus of session was on bed mobility and getting pt OOB to recliner chair with use of maximove. Pt would continue to benefit from skilled physical therapy services at this time while admitted and after d/c to address the below listed limitations in order to improve overall safety and independence with functional mobility.    Follow Up Recommendations  SNF;Supervision/Assistance - 24 hour     Equipment Recommendations  Hospital bed;Wheelchair (measurements PT)    Recommendations for Other Services       Precautions / Restrictions Precautions Precautions: Fall Precaution Comments: Peg/trach Restrictions Weight Bearing Restrictions: No    Mobility  Bed Mobility Overal bed mobility: Needs Assistance Bed Mobility: Rolling Rolling: Total assist;+2 for physical assistance         General bed mobility comments: total A x2 to roll bilaterally multiple times for pad placement in preparation for transfer with maximove  Transfers Overall transfer level: Needs assistance               General transfer comment: lifted OOB to chair via [employer]  Ambulation/Gait                 Stairs             Wheelchair Mobility    Modified Rankin (Stroke Patients Only)       Balance                                             Cognition Arousal/Alertness: Awake/alert Behavior During Therapy: WFL for tasks assessed/performed Overall Cognitive Status: Impaired/Different from baseline Area of Impairment: Memory;Following commands;Awareness                     Memory: Decreased short-term memory Following Commands: Follows one step commands with increased time;Follows multi-step commands inconsistently   Awareness: Emergent          Exercises      General Comments        Pertinent Vitals/Pain Pain Assessment: No/denies pain    Home Living                      Prior Function            PT Goals (current goals can now be found in the care plan section) Acute Rehab PT Goals PT Goal Formulation: With patient Time For Goal Achievement: 11/07/18 Potential to Achieve Goals: Fair Progress towards PT goals: Progressing toward goals    Frequency    Min 2X/week      PT Plan Current plan remains appropriate    Co-evaluation              AM-PAC PT "6  Clicks" Mobility   Outcome Measure  Help needed turning from your back to your side while in a flat bed without using bedrails?: Total Help needed moving from lying on your back to sitting on the side of a flat bed without using bedrails?: Total Help needed moving to and from a bed to a chair (including a wheelchair)?: Total Help needed standing up from a chair using your arms (e.g., wheelchair or bedside chair)?: Total Help needed to walk in hospital room?: Total Help needed climbing 3-5 steps with a railing? : Total 6 Click Score: 6    End of Session Equipment Utilized During Treatment: Oxygen;Other (comment)(trach collar) Activity Tolerance: Patient tolerated treatment well Patient left: in chair;with call bell/phone within reach;with family/visitor present Nurse Communication: Mobility status;Need for lift equipment PT Visit Diagnosis: Other abnormalities of  gait and mobility (R26.89);Muscle weakness (generalized) (M62.81);Other symptoms and signs involving the nervous system (R29.898);Hemiplegia and hemiparesis Hemiplegia - Right/Left: Right Hemiplegia - dominant/non-dominant: Dominant Hemiplegia - caused by: Cerebral infarction     Time: 7342-8768 PT Time Calculation (min) (ACUTE ONLY): 42 min  Charges:  $Therapeutic Activity: 38-52 mins                     Sherie Don, PT, DPT  Acute Rehabilitation Services Pager [phone] Office [place] 11/06/2018, 4:58 PM

## 2018-11-06 NOTE — Progress Notes (Signed)
STROKE TEAM PROGRESS NOTE   SUBJECTIVE (INTERVAL HISTORY) Patient complains of feeling a little congested today.  She was laying pretty flat in bed so I raised her head up.  She was just evaluated by speech therapy who increased her diet from dysphagia 1 to dysphagia to nectar thick liquid diet.  Discussed possibility of trach removal with respiratory therapist and they thought it was reasonable to consider.  They recommended plugging for 24 to 48 hours before extubation.  Will discuss further with CCM.  OBJECTIVE Vitals:   11/06/18 0324 11/06/18 0407 11/06/18 0754 11/06/18 0801  BP:  (!) 168/80  (!) 167/79  Pulse: 73 78 74 76  Resp: 19 20 18 18   Temp:  98.8 F (37.1 C)  98.2 F (36.8 C)  TempSrc:  Oral  Oral  SpO2: 98% 95% 98% 98%  Weight:      Height:        CBC:  Recent Labs  Lab 11/02/18 0539  WBC 10.2  HGB 9.7*  HCT 32.9*  MCV 85.0  PLT 389    Basic Metabolic Panel:  Recent Labs  Lab 11/02/18 0539  NA 139  K 4.0  CL 104  CO2 25  GLUCOSE 142*  BUN 29*  CREATININE 0.66  CALCIUM 11.4*    IMAGING No results found.   Left Upper extremity Doppler Left upper extremity is negative for deep and positive superficial vein thrombosis in the Cephalic vein.   PHYSICAL EXAM -again, no change in exam  General -  Obese middle-aged African-American lady, on trach collar with speaking valve, not in distress. Her abd is soft, nondistended and nontender. PEG tube in place. No elicited discomfort to shoulder evaluation, ROM. Cardiovascular - regular rate and rhythm Neurological Exam - on trach collar, eyes open, awake, alert. No ptosis or EOMI, denies diplopia.  PERRL. Bilateral facial weakness, tongue protrusion weak. Able to wiggle left hand fingers and left foot toes. 2-/5 LLE proximal and 2/5 distally and 2/5 LUE.  Right-sided hemiplegia.  Follows commands and good eye contact.  Speech is moderate dysarthric with speaking valve. No aphasia. Sensation intact to touch, cold.  Gait not tested.    ASSESSMENT/PLAN [name] is a 58 y.o. female with history of difficult to control hypertension, insulin-dependent diabetes, obesity, hypothyroidism status post ablation presenting with chest discomfort, right-sided weakness, slurred speech and right facial droop. She did not receive IV t-PA due to late presentation. S/P stent assisted angioplasty of distal basilar artery.  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 Worsening symptoms with extension of pontine/medullary infarcts with new small bilateral cerebellar infarcts without evidence of stent re-stenosis or occlusion  Remains stable  VTE prophylaxis - heparin subq  on aspirin 81 mg daily PTA. Now on Brilinta 90 mg bid and ASA 81 mg daily.   Therapy recommendations:  SNF  Disposition:  SNF  Medically ready for d/c  D/c paperwork completed 10/14/18  Abnormal uterine bleeding, stable  Has been following with GYN  Was on megace in the past  CT abd/pelvis 09/03/18 - enlarged fibroid uterus  continue megace 80mg  bid  Continue ASA and brilinta   OBGYN recs   Received prmarin 25mg  IV once - vaginal bleeding much improved  Anemia, resolved  Hb 6.8->PRBC->7.1->10.3->9.7  Likely due to iron deficiency, heavy periods and acute uterine bleeding and abdominal wall surgeries  Iron panel showed iron deficiency  On iron solutions  PRBC transfusion 1 U 09/08/2018 and  2U 09/07/18 and 2U 09/21/18  Check labs in am  Intracranial stenosis  BA mid to distal severe stenosis s/p BA stenting  Left PCA severe stenosis, R PCA moderate stenosis  Left MCA moderate stenosis  Uncontrolled stroke risk factors with HLD, HTN, DM  Non compliance with meds at home  Respiratory failure  S/p trach 08/24/2018  Able to speak with [name] valve although dysarthric  Maintain trach collar  Maintain current trach type and size (uncuffed Shiley #4) per CCM 3  days ago d/t weak cough, secretions, plugging  RT recommends plugging 24-48h to consider removal  Will follow up with CCM   Follow up at d.c in the trach clinic  Hypertension  On the high end - 150-170s  BP goal 130-160s  On Coreg 25 bid, amlodipine 10 and hydralazine 50 mg every 6 hours  Increase hydralazine to 75 q 6h  Hyperlipidemia  Lipid lowering medication PTA:  Lipitor 20 mg daily  LDL 174, goal < 70  Now on Lipitor 80 mg daily  Continue statin at discharge  Diabetes  HgbA1c 11.4, goal < 7.0  Lantus 17u BID  Basal NovoLog to 3U 4 times a day with Vital AF feeding - this was d/c'd 12/26 at 2234 - continue to hold for now d/t normalized glucoses  SSI  QAC+QHS  CBG monitoring QAC+QHS  Hypernatremia -> hyponatremia-> hypernatremia->normal  Na 139  On vital AF 4 times a day  On free water 200cc qid  Check labs in am  Dysphagia s/p PEG complicated by abdominal wall abscess, resolved  PEG done 08/22/2018  Removed and replaced dislodged PEG 08/12/2018  CT showed Abdominal wall abscess - s/p open surgical drainage 08/31/2018 persistent foul odor discharge. Reexploration 09/27/2018  Wound debrided appropriately and sutures removed.  No evisceration  12/24 - new abdominal pain during the night. Seen by trauma. CT abd/pelvis unremarkable.  No surgical, acute issues. They signed off.  On supplement 3x day  Diet increased from dysphagia 1 to Dysphagia 2 diet with nectar thick liquids today  Atypical chest pain, resolved  sternal chest pain 10/19/18 after bouts of coughing.   Tele no changes.   Troponin < 0.03.   CXR negative.   Electrolytes WNL  Chest pain since resolved.   Other Stroke Risk Factors  Morbid Obesity, Body mass index is 45.41 kg/m., recommend weight loss, diet and exercise as appropriate   Other Active Problems  Thrombocytosis, resolved - likely due to anemia, 385  Oral thrush, resolved on nystatin. Continue frequent mouth  care  Left arm pain. B shoulder pain. Pain mgt, k pad. No inc in WBC, TM 99.4. [place] with positive superficial vein thrombosis in the L Cephalic vein. Treat with ASA. AC not indication. Continue. Muscle rub as added to pain regimen  Contact precautions for ESBL  Hospital day # [place], MSN, APRN, [employer], AGPCNP-BC Advanced Practice Stroke Nurse [place] for Schedule & Pager information 11/06/2018 8:49 AM

## 2018-11-07 LAB — CBC
HCT: 34.1 % — ABNORMAL LOW (ref 36.0–46.0)
HEMOGLOBIN: 10.5 g/dL — AB (ref 12.0–15.0)
MCH: 26.5 pg (ref 26.0–34.0)
MCHC: 30.8 g/dL (ref 30.0–36.0)
MCV: 86.1 fL (ref 80.0–100.0)
Platelets: 414 10*3/uL — ABNORMAL HIGH (ref 150–400)
RBC: 3.96 MIL/uL (ref 3.87–5.11)
RDW: 17.7 % — ABNORMAL HIGH (ref 11.5–15.5)
WBC: 9 10*3/uL (ref 4.0–10.5)
nRBC: 0 % (ref 0.0–0.2)

## 2018-11-07 LAB — GLUCOSE, CAPILLARY
Glucose-Capillary: 110 mg/dL — ABNORMAL HIGH (ref 70–99)
Glucose-Capillary: 130 mg/dL — ABNORMAL HIGH (ref 70–99)
Glucose-Capillary: 105 mg/dL — ABNORMAL HIGH (ref 70–99)
Glucose-Capillary: 115 mg/dL — ABNORMAL HIGH (ref 70–99)

## 2018-11-07 LAB — BASIC METABOLIC PANEL
Anion gap: 8 (ref 5–15)
BUN: 24 mg/dL — ABNORMAL HIGH (ref 6–20)
CHLORIDE: 107 mmol/L (ref 98–111)
CO2: 24 mmol/L (ref 22–32)
Calcium: 12.4 mg/dL — ABNORMAL HIGH (ref 8.9–10.3)
Creatinine, Ser: 0.73 mg/dL (ref 0.44–1.00)
GFR calc Af Amer: >60 mL/min (ref >60)
GFR calc non Af Amer: >60 mL/min (ref >60)
Glucose, Bld: 106 mg/dL — ABNORMAL HIGH (ref 70–99)
Potassium: 3.7 mmol/L (ref 3.5–5.1)
Sodium: 139 mmol/L (ref 135–145)

## 2018-11-07 NOTE — Plan of Care (Signed)
Patient consumed 75% of breakfast meal feed by nurse. Patient stated she didn't like her tray so nurse got oatmeal and yogurt for patient and she stated that it was good and was happy with the meal.

## 2018-11-07 NOTE — Progress Notes (Addendum)
Andrea [name] Progress Note    Brief Summary:  58 y/o F admitted 10/9 s/p pontine stroke s/p prolonged ICU stay s/p trach with dysphagia.  Secretions and weak cough have been barrier to trach advancement.  However, she has improved to the point of tolerating a D2 diet (as of 1/3) and is speaking around the trach with no PMV.     S: RT reports pt tolerating ATC, talking around trach, strong cough   O: Blood pressure (!) 150/89, pulse 67, temperature 97.6 F (36.4 C), temperature source Oral, resp. rate 20, height 5\' 4"  (1.626 m), weight 120 kg, last menstrual period 07/26/2018, SpO2 98 %.  General: chronically ill appearing adult female in NAD sitting up in chair  HEENT: MM pink/moist, #4 trach midline c/d/i, tolerating trach cap while in room with patient, 28% ATC Neuro: Awake, alert, appropriate  CV: s1s2 rrr, no m/r/g PULM: even/non-labored, lungs bilaterally clear AJ:OINO, non-tender, bsx4 active  Extremities: warm/dry, no edema  Skin: no rashes or lesions  Recent Labs  Lab 11/02/18 0539 11/07/18 0547  HGB 9.7* 10.5*  HCT 32.9* 34.1*  WBC 10.2 9.0  PLT 385 414*   Recent Labs  Lab 11/02/18 0539 11/07/18 0547  NA 139 139  K 4.0 3.7  CL 104 107  CO2 25 24  GLUCOSE 142* 106*  BUN 29* 24*  CREATININE 0.66 0.73  CALCIUM 11.4* 12.4*   A: Status Post Pontine CVA with R Hemiplegia, Dysphagia s/p Trach, PEG   P: Trial trach capping > started 1/3 pm.   Would leave capped over the weekend unless acute distress and reassess for possible decannulation on 1/6 If she has respiratory distress, hypoxemia or change in clinical status, remove cap immediately.  Discussed with bedside RN.    Noe Gens, NP-C [facility] Pgr: [phone] or if no answer [phone] 11/07/2018, 2:49 PM

## 2018-11-07 NOTE — Progress Notes (Signed)
Physical Therapy Treatment Patient Details Name: Andrea [name] MRN: [id number] DOB: February 28, 1961 Today's Date: 11/07/2018    History of Present Illness Pt is a 58 y/o female who presented to ED with chest pain noted Rt hemiparesis in ED with acute Left paramedian brainstem infarct. Intubated 10/10 for angioplasty, extubated post procedure and reintubated. Repeat MRI with brainstem infarct extension and bil cerebellar infarcts. Peg/trach with return to vent 08/06/2018. 10/22-29 on trach collar, return to vent 10/29. 10/28 I & D of abdominal wall abscess with placement of penrose drain and G-tube, 11/1 repeat ex lap. 11/7 return to trach collar.  PMHx: HTN, DM, CKD    PT Comments    Pt making extremely slow progress from day to day.  She shows to be more able to access trunk musculature to work on balance, but is not getting OOB to chair enough.   Follow Up Recommendations  SNF;Supervision/Assistance - 24 hour     Equipment Recommendations  Hospital bed;Wheelchair (measurements PT)    Recommendations for Other Services       Precautions / Restrictions Precautions Precautions: Fall Precaution Comments: Peg/trach    Mobility  Bed Mobility Overal bed mobility: Needs Assistance Bed Mobility: Rolling Rolling: Total assist;+2 for physical assistance   Supine to sit: Total assist;+2 for physical assistance Sit to supine: Total assist;+2 for physical assistance   General bed mobility comments: total A x2 to roll bilaterally multiple times for pad placement in preparation for transfer with maximove  Transfers Overall transfer level: Needs assistance                  Ambulation/Gait             General Gait Details: unable   Stairs             Wheelchair Mobility    Modified Rankin (Stroke Patients Only) Modified Rankin (Stroke Patients Only) Modified Rankin: Severe disability     Balance Overall balance assessment: Needs assistance Sitting-balance  support: Feet supported;Bilateral upper extremity supported Sitting balance-Leahy Scale: Poor Sitting balance - Comments: Worked EOB for 7 min on upright sitting posture, truncal activation and coming into and out of midline.  propped on R elbow.  Needing total assist overall     Standing balance-Leahy Scale: Zero                              Cognition Arousal/Alertness: Awake/alert Behavior During Therapy: WFL for tasks assessed/performed Overall Cognitive Status: Impaired/Different from baseline Area of Impairment: Memory;Following commands;Awareness                   Current Attention Level: Selective Memory: Decreased short-term memory Following Commands: Follows one step commands with increased time;Follows multi-step commands inconsistently   Awareness: Emergent Problem Solving: Slow processing        Exercises Other Exercises Other Exercises: P/AA ROM to bil LE's    General Comments        Pertinent Vitals/Pain Pain Assessment: Faces Faces Pain Scale: Hurts little more Pain Location: shoulders and back Pain Descriptors / Indicators: Discomfort;Grimacing;Guarding Pain Intervention(s): Monitored during session    Home Living                      Prior Function            PT Goals (current goals can now be found in the care plan section) Acute Rehab PT Goals Patient Stated  Goal: to be able to help feed myself PT Goal Formulation: With patient Time For Goal Achievement: 11/07/18 Potential to Achieve Goals: Fair Progress towards PT goals: Progressing toward goals    Frequency    Min 2X/week      PT Plan Current plan remains appropriate    Co-evaluation              AM-PAC PT "6 Clicks" Mobility   Outcome Measure  Help needed turning from your back to your side while in a flat bed without using bedrails?: Total Help needed moving from lying on your back to sitting on the side of a flat bed without using bedrails?:  Total Help needed moving to and from a bed to a chair (including a wheelchair)?: Total Help needed standing up from a chair using your arms (e.g., wheelchair or bedside chair)?: Total Help needed to walk in hospital room?: Total Help needed climbing 3-5 steps with a railing? : Total 6 Click Score: 6    End of Session   Activity Tolerance: Patient tolerated treatment well Patient left: in chair;with call bell/phone within reach;with family/visitor present   PT Visit Diagnosis: Other abnormalities of gait and mobility (R26.89);Muscle weakness (generalized) (M62.81);Other symptoms and signs involving the nervous system (R29.898);Hemiplegia and hemiparesis Hemiplegia - Right/Left: Right Hemiplegia - dominant/non-dominant: Dominant Hemiplegia - caused by: Cerebral infarction     Time: 6438-3818 PT Time Calculation (min) (ACUTE ONLY): 30 min  Charges:  $Therapeutic Activity: 23-37 mins                     11/07/2018  Andrea [name], PT Acute Rehabilitation Services [phone]  (pager) [phone]  (office)   Andrea [name] Andrea [name] 11/07/2018, 1:04 PM

## 2018-11-07 NOTE — Social Work (Addendum)
2:59pm- Updated pt clinicals requested from [facility], faxed to [phone].  2:19pm- Messages left x2 for [facility], no call back. Also f/u with Clarene Critchley at [facility].  CSW continuing to follow for support with disposition.  Westley Hummer, MSW, [place] Work [phone]

## 2018-11-07 NOTE — Progress Notes (Signed)
Patient Trach Plugged per order.  Patient tolerating well.  SPO2 100% on RA.  Pt able to talk. RT to monitor

## 2018-11-07 NOTE — Progress Notes (Addendum)
STROKE TEAM PROGRESS NOTE   SUBJECTIVE (INTERVAL HISTORY) No new complaints. Patient excited about getting trach plugged.  OBJECTIVE Vitals:   11/07/18 0851 11/07/18 1152 11/07/18 1302 11/07/18 1451  BP: (!) 160/85  (!) 150/89   Pulse: 81 67  78  Resp:  20  20  Temp: 97.6 F (36.4 C)     TempSrc: Oral     SpO2: 98% 98%  100%  Weight:      Height:        CBC:  Recent Labs  Lab 11/02/18 0539 11/07/18 0547  WBC 10.2 9.0  HGB 9.7* 10.5*  HCT 32.9* 34.1*  MCV 85.0 86.1  PLT 385 414*    Basic Metabolic Panel:  Recent Labs  Lab 11/02/18 0539 11/07/18 0547  NA 139 139  K 4.0 3.7  CL 104 107  CO2 25 24  GLUCOSE 142* 106*  BUN 29* 24*  CREATININE 0.66 0.73  CALCIUM 11.4* 12.4*    IMAGING No results found.    PHYSICAL EXAM  General -  Obese middle-aged African-American lady, on trach collar with speaking valve, not in distress. Her abd is soft, nondistended and nontender. PEG tube in place. No elicited discomfort to shoulder evaluation, ROM. Cardiovascular - regular rate and rhythm Neurological Exam - on trach collar, eyes open, awake, alert. No ptosis or EOMI, denies diplopia.  PERRL. Bilateral facial weakness, tongue protrusion weak. Able to wiggle left hand fingers and left foot toes. 2-/5 LLE proximal and 2/5 distally and 2/5 LUE.  Right-sided hemiplegia- starting to have some mild muscular contractions with effort in R hand, not in R foot. Follows commands and good eye contact.  Speech is moderate dysarthric with speaking valve. No aphasia. Sensation intact to touch, cold. Gait not tested.    ASSESSMENT/PLAN Ms. HELI DINO is a 58 y.o. female with history of difficult to control hypertension, insulin-dependent diabetes, obesity, hypothyroidism status post ablation presenting with chest discomfort, right-sided weakness, slurred speech and right facial droop. She did not receive IV t-PA due to late presentation. S/P stent assisted angioplasty of distal basilar  artery.  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 Worsening symptoms with extension of pontine/medullary infarcts with new small bilateral cerebellar infarcts without evidence of stent re-stenosis or occlusion  Remains stable  VTE prophylaxis - heparin subq  on aspirin 81 mg daily PTA. Now on Brilinta 90 mg bid and ASA 81 mg daily.   Therapy recommendations:  SNF  Disposition:  SNF  Medically ready for d/c  D/c paperwork completed 10/14/18  Abnormal uterine bleeding, stable  Has been following with GYN  Was on megace in the past  CT abd/pelvis 09/03/18 - enlarged fibroid uterus  continue megace 80mg  bid  Continue ASA and brilinta   OBGYN recs   Received primarin 25mg  IV once - vaginal bleeding much improved  Started to have bleeding again 11/06/18, continue megace but may need IV primarin again if necessary  Anemia, resolved  Hb 6.8->PRBC->7.1->10.3->9.7->10.5  Likely due to iron deficiency, heavy periods and acute uterine bleeding and abdominal wall surgeries  Iron panel showed iron deficiency  On iron solutions  PRBC transfusion 1 U 09/28/2018 and 2U 09/07/18 and 2U 09/21/18  Intracranial stenosis  BA mid to distal severe stenosis s/p BA stenting  Left PCA severe stenosis, R PCA moderate stenosis  Left MCA moderate stenosis  Uncontrolled stroke risk factors with HLD, HTN, DM  Non compliance with meds at home  Respiratory failure  S/p trach 08/09/2018  Able to speak with [name] valve although dysarthric  Maintain trach collar  Maintain current trach type and size (uncuffed Shiley #4) per CCM 3 days ago d/t weak cough, secretions, plugging  RT recommends plugging 24-48h to consider removal  Discussed with Brandi from CCM today. She will follow up and trial plugging this weekend. If remains stable over weekend, can consider taking out next week.   Follow up at d.c in the trach clinic if it  remains  Hypertension  On the high end - 150-170s  BP goal 130-160s  On Coreg 25 bid, amlodipine 10 and hydralazine 50 mg every 6 hours  Increase hydralazine to 75 q 6h  Hyperlipidemia  Lipid lowering medication PTA:  Lipitor 20 mg daily  LDL 174, goal < 70  Now on Lipitor 80 mg daily  Continue statin at discharge  Diabetes  HgbA1c 11.4, goal < 7.0  Lantus 17u BID  Basal NovoLog to 3U 4 times a day with Vital AF feeding - this was d/c'd 12/26 at 2234 - continue to hold for now d/t normalized glucoses  SSI  QAC+QHS  CBG monitoring QAC+QHS  Hypernatremia -> hyponatremia-> hypernatremia->normal, resolved  Na 139  On vital AF 4 times a day  On free water 200cc qid  Dysphagia s/p PEG complicated by abdominal wall abscess, resolved  PEG done 08/20/2018  Removed and replaced dislodged PEG 08/26/2018  CT showed Abdominal wall abscess - s/p open surgical drainage 09/01/18 persistent foul odor discharge. Reexploration 09/20/2018  Wound debrided appropriately and sutures removed.  No evisceration  12/24 - new abdominal pain during the night. Seen by trauma. CT abd/pelvis unremarkable.  No surgical, acute issues. They signed off.  On supplement 3x day  Diet increased from dysphagia 1 to Dysphagia 2 diet with nectar thick liquids today  Atypical chest pain, resolved  sternal chest pain 10/19/18 after bouts of coughing.   Tele no changes.   Troponin < 0.03.   CXR negative.   Electrolytes WNL  Chest pain since resolved.   Other Stroke Risk Factors  Morbid Obesity, Body mass index is 45.41 kg/m., recommend weight loss, diet and exercise as appropriate   Other Active Problems  Thrombocytosis, resolved - likely due to anemia, 385  Oral thrush, resolved on nystatin. Continue frequent mouth care  Left arm pain. B shoulder pain. Pain mgt, k pad. No inc in WBC, TM 99.4. [place] with positive superficial vein thrombosis in the L Cephalic vein. Treat with ASA. AC not  indication. Continue. Muscle rub as added to pain regimen  Contact precautions for ESBL  Hospital day # [place], MSN, APRN, [employer], AGPCNP-BC Advanced Practice Stroke Nurse [place] for Schedule & Pager information 11/07/2018 3:22 PM

## 2018-11-08 LAB — GLUCOSE, CAPILLARY
Glucose-Capillary: 92 mg/dL (ref 70–99)
Glucose-Capillary: 117 mg/dL — ABNORMAL HIGH (ref 70–99)
Glucose-Capillary: 99 mg/dL (ref 70–99)
Glucose-Capillary: 108 mg/dL — ABNORMAL HIGH (ref 70–99)

## 2018-11-08 MED ORDER — GABAPENTIN 250 MG/5ML PO SOLN
100.0000 mg | Freq: Three times a day (TID) | ORAL | Status: DC
  Administered 2018-11-09 – 2018-11-13 (×14): 100 mg
  Filled 2018-11-08 (×19): qty 2

## 2018-11-08 NOTE — Progress Notes (Signed)
STROKE TEAM PROGRESS NOTE   SUBJECTIVE (INTERVAL HISTORY) No new complaints. Patient was having leg pain which responds to gabapentin but she gets it only twice daily. CCM team planning to decannulate her tracheostomy soon OBJECTIVE Vitals:   11/08/18 0812 11/08/18 1111 11/08/18 1226 11/08/18 1226  BP: (!) 167/91   140/71  Pulse: 85 80  77  Resp:  18    Temp: 98.8 F (37.1 C)  98.6 F (37 C)   TempSrc: Oral  Oral   SpO2:  97% 98%   Weight:      Height:        CBC:  Recent Labs  Lab 11/02/18 0539 11/07/18 0547  WBC 10.2 9.0  HGB 9.7* 10.5*  HCT 32.9* 34.1*  MCV 85.0 86.1  PLT 385 414*    Basic Metabolic Panel:  Recent Labs  Lab 11/02/18 0539 11/07/18 0547  NA 139 139  K 4.0 3.7  CL 104 107  CO2 25 24  GLUCOSE 142* 106*  BUN 29* 24*  CREATININE 0.66 0.73  CALCIUM 11.4* 12.4*    IMAGING No results found.    PHYSICAL EXAM  General -  Obese middle-aged African-American lady, on trach collar with speaking valve, not in distress. Her abd is soft, nondistended and nontender. PEG tube in place. No elicited discomfort to shoulder evaluation, ROM. Cardiovascular - regular rate and rhythm Neurological Exam - on trach collar, eyes open, awake, alert. No ptosis or EOMI, denies diplopia.  PERRL. Bilateral facial weakness, tongue protrusion weak. Able to wiggle left hand fingers and left foot toes. 2-/5 LLE proximal and 2/5 distally and 2/5 LUE.  Right-sided hemiplegia- starting to have some mild muscular contractions with effort in R hand, not in R foot. Follows commands and good eye contact.  Speech is moderate dysarthric with speaking valve. No aphasia. Sensation intact to touch, cold. Gait not tested.    ASSESSMENT/PLAN Andrea [name] is a 58 y.o. female with history of difficult to control hypertension, insulin-dependent diabetes, obesity, hypothyroidism status post ablation presenting with chest discomfort, right-sided weakness, slurred speech and right facial  droop. She did not receive IV t-PA due to late presentation. S/P stent assisted angioplasty of distal basilar artery.  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 Worsening symptoms with extension of pontine/medullary infarcts with new small bilateral cerebellar infarcts without evidence of stent re-stenosis or occlusion  Remains stable  VTE prophylaxis - heparin subq  on aspirin 81 mg daily PTA. Now on Brilinta 90 mg bid and ASA 81 mg daily.   Therapy recommendations:  SNF  Disposition:  SNF  Medically ready for d/c  D/c paperwork completed 10/14/18  Abnormal uterine bleeding, stable  Has been following with GYN  Was on megace in the past  CT abd/pelvis 09/03/18 - enlarged fibroid uterus  continue megace 80mg  bid  Continue ASA and brilinta   OBGYN recs   Received primarin 25mg  IV once - vaginal bleeding much improved  Started to have bleeding again 11/06/18, continue megace but may need IV primarin again if necessary.hemoglobin 10.5 and hematocrit 34.1 stable on 11/07/18  Anemia, resolved  Hb 6.8->PRBC->7.1->10.3->9.7->10.5  Likely due to iron deficiency, heavy periods and acute uterine bleeding and abdominal wall surgeries  Iron panel showed iron deficiency  On iron solutions  PRBC transfusion 1 U 10/02/2018 and 2U 09/07/18 and 2U 09/21/18  Intracranial stenosis  BA mid to distal severe stenosis s/p BA stenting  Left PCA severe stenosis,  R PCA moderate stenosis  Left MCA moderate stenosis  Uncontrolled stroke risk factors with HLD, HTN, DM  Non compliance with meds at home  Respiratory failure  S/p trach 08/07/2018  Able to speak with Andrea [name] valve although dysarthric  Maintain trach collar  Maintain current trach type and size (uncuffed Shiley #4) per CCM 3 days ago d/t weak cough, secretions, plugging  RT recommends plugging 24-48h to consider removal  Discussed with Andrea [name] from CCM  . She will follow  up and trial plugging this weekend. If remains stable over weekend, can consider taking out next week.   Follow up at d.c in the trach clinic if it remains  Hypertension  On the high end - 150-170s  BP goal 130-160s  On Coreg 25 bid, amlodipine 10 and hydralazine 50 mg every 6 hours  Increase hydralazine to 75 q 6h  Hyperlipidemia  Lipid lowering medication PTA:  Lipitor 20 mg daily  LDL 174, goal < 70  Now on Lipitor 80 mg daily  Continue statin at discharge  Diabetes  HgbA1c 11.4, goal < 7.0  Lantus 17u BID  Basal NovoLog to 3U 4 times a day with Vital AF feeding - this was d/c'd 12/26 at 2234 - continue to hold for now d/t normalized glucoses  SSI  QAC+QHS  CBG monitoring QAC+QHS  Hypernatremia -> hyponatremia-> hypernatremia->normal, resolved  Na 139  On vital AF 4 times a day  On free water 200cc qid  Dysphagia s/p PEG complicated by abdominal wall abscess, resolved  PEG done 08/20/2018  Removed and replaced dislodged PEG 08/06/2018  CT showed Abdominal wall abscess - s/p open surgical drainage 08/31/2018 persistent foul odor discharge. Reexploration 10/04/2018  Wound debrided appropriately and sutures removed.  No evisceration  12/24 - new abdominal pain during the night. Seen by trauma. CT abd/pelvis unremarkable.  No surgical, acute issues. They signed off.  On supplement 3x day  Diet increased from dysphagia 1 to Dysphagia 2 diet with nectar thick liquids today  Atypical chest pain, resolved  sternal chest pain 10/19/18 after bouts of coughing.   Tele no changes.   Troponin < 0.03.   CXR negative.   Electrolytes WNL  Chest pain since resolved.   Other Stroke Risk Factors  Morbid Obesity, Body mass index is 45.41 kg/m., recommend weight loss, diet and exercise as appropriate   Other Active Problems  Thrombocytosis, resolved - likely due to anemia, 385  Oral thrush, resolved on nystatin. Continue frequent mouth care  Left arm pain. B  shoulder pain. Pain mgt, k pad. No inc in WBC, TM 99.4. [place] with positive superficial vein thrombosis in the L Cephalic vein. Treat with ASA. AC not indication. Continue. Muscle rub as added to pain regimen  Contact precautions for ESBL  Hospital day # 86    plan increase gabapentin to 3 times daily. Tracheostomy tube to be plugged as per CCM. Patient medically stable to transfer to skilled nursing facility but no bed available as she is on difficult to place list. Antony Contras, MD Medical Director [place] Pager: [phone] 11/08/2018 4:22 PM

## 2018-11-09 LAB — GLUCOSE, CAPILLARY
Glucose-Capillary: 91 mg/dL (ref 70–99)
Glucose-Capillary: 109 mg/dL — ABNORMAL HIGH (ref 70–99)
Glucose-Capillary: 96 mg/dL (ref 70–99)
Glucose-Capillary: 150 mg/dL — ABNORMAL HIGH (ref 70–99)

## 2018-11-09 NOTE — Progress Notes (Signed)
Pt remains capped and on Room Air.  Found to be very anxious and tearful stating that she was very nervous and needed her nerve pill.  Suctioned via trach for scant amt thin white secretions.  BS relatively clear, Spo2 92-94%.  Will cont to monitor.

## 2018-11-09 NOTE — Progress Notes (Signed)
STROKE TEAM PROGRESS NOTE   SUBJECTIVE (INTERVAL HISTORY) She is lying comfortably in bed. Still has some leg pain.  No new issues OBJECTIVE Vitals:   11/09/18 0424 11/09/18 0828 11/09/18 1100 11/09/18 1255  BP:  (!) 167/75  (!) 152/76  Pulse:  72  86  Resp:  18  18  Temp:  99 F (37.2 C)  99 F (37.2 C)  TempSrc:  Oral  Oral  SpO2: 100% 100% 100% 100%  Weight:      Height:        CBC:  Recent Labs  Lab 11/07/18 0547  WBC 9.0  HGB 10.5*  HCT 34.1*  MCV 86.1  PLT 414*    Basic Metabolic Panel:  Recent Labs  Lab 11/07/18 0547  NA 139  K 3.7  CL 107  CO2 24  GLUCOSE 106*  BUN 24*  CREATININE 0.73  CALCIUM 12.4*    IMAGING No results found.    PHYSICAL EXAM  General -  Obese middle-aged African-American lady, on trach collar with speaking valve, not in distress. Her abd is soft, nondistended and nontender. PEG tube in place. No elicited discomfort to shoulder evaluation, ROM. Cardiovascular - regular rate and rhythm Neurological Exam - on trach collar, eyes open, awake, alert. No ptosis or EOMI, denies diplopia.  PERRL. Bilateral facial weakness, tongue protrusion weak. Able to wiggle left hand fingers and left foot toes. 2-/5 LLE proximal and 2/5 distally and 2/5 LUE.  Right-sided hemiplegia- starting to have some mild muscular contractions with effort in R hand, not in R foot. Follows commands and good eye contact.  Speech is moderate dysarthric with speaking valve. No aphasia. Sensation intact to touch, cold. Gait not tested.    ASSESSMENT/PLAN Ms. KHARTER SESTAK is a 58 y.o. female with history of difficult to control hypertension, insulin-dependent diabetes, obesity, hypothyroidism status post ablation presenting with chest discomfort, right-sided weakness, slurred speech and right facial droop. She did not receive IV t-PA due to late presentation. S/P stent assisted angioplasty of distal basilar artery.  Stroke:  Paramedian left pontine infarct due to  basilar artery stenosis s/p BA stenting. Worsening symptoms with extension of pontine/medullary infarcts with new small bilateral cerebellar infarcts without evidence of stent re-stenosis or occlusion  Remains stable  VTE prophylaxis - heparin subq  on aspirin 81 mg daily PTA. Now on Brilinta 90 mg bid and ASA 81 mg daily.   Therapy recommendations:  SNF  Disposition:  SNF  Medically ready for d/c  D/c paperwork completed 10/14/18  Abnormal uterine bleeding, stable  Has been following with GYN  Was on megace in the past  CT abd/pelvis 09/03/18 - enlarged fibroid uterus  continue megace 80mg  bid  Continue ASA and brilinta   OBGYN recs   Received primarin 25mg  IV once - vaginal bleeding much improved  Started to have bleeding again 11/06/18, continue megace but may need IV primarin again if necessary.hemoglobin 10.5 and hematocrit 34.1 stable on 11/07/18  Anemia, resolved  Hb 6.8->PRBC->7.1->10.3->9.7->10.5  Likely due to iron deficiency, heavy periods and acute uterine bleeding and abdominal wall surgeries  Iron panel showed iron deficiency  On iron solutions  PRBC transfusion 1 U 09/26/2018 and 2U 09/07/18 and 2U 09/21/18  Intracranial stenosis  BA mid to distal severe stenosis s/p BA stenting  Left PCA severe stenosis, R PCA moderate stenosis  Left MCA moderate stenosis  Uncontrolled stroke risk factors with HLD, HTN, DM  Non compliance with meds at home  Respiratory failure  S/p trach 08/26/2018  Able to speak with [name] valve although dysarthric  Maintain trach collar  Maintain current trach type and size (uncuffed Shiley #4) per CCM 3 days ago d/t weak cough, secretions, plugging  RT recommends plugging 24-48h to consider removal  Discussed with Brandi from CCM  . She will follow up and trial plugging this weekend. If remains stable over weekend, can consider taking out next week.   Follow up at d.c in the trach clinic if it  remains  Hypertension  On the high end - 150-170s  BP goal 130-160s  On Coreg 25 bid, amlodipine 10 and hydralazine 50 mg every 6 hours  Increase hydralazine to 75 q 6h  Hyperlipidemia  Lipid lowering medication PTA:  Lipitor 20 mg daily  LDL 174, goal < 70  Now on Lipitor 80 mg daily  Continue statin at discharge  Diabetes  HgbA1c 11.4, goal < 7.0  Lantus 17u BID  Basal NovoLog to 3U 4 times a day with Vital AF feeding - this was d/c'd 12/26 at 2234 - continue to hold for now d/t normalized glucoses  SSI  QAC+QHS  CBG monitoring QAC+QHS  Hypernatremia -> hyponatremia-> hypernatremia->normal, resolved  Na 139  On vital AF 4 times a day  On free water 200cc qid  Dysphagia s/p PEG complicated by abdominal wall abscess, resolved  PEG done 08/14/2018  Removed and replaced dislodged PEG 08/18/2018  CT showed Abdominal wall abscess - s/p open surgical drainage 08/22/2018 persistent foul odor discharge. Reexploration 10/03/2018  Wound debrided appropriately and sutures removed.  No evisceration  12/24 - new abdominal pain during the night. Seen by trauma. CT abd/pelvis unremarkable.  No surgical, acute issues. They signed off.  On supplement 3x day  Diet increased from dysphagia 1 to Dysphagia 2 diet with nectar thick liquids today  Atypical chest pain, resolved  sternal chest pain 10/19/18 after bouts of coughing.   Tele no changes.   Troponin < 0.03.   CXR negative.   Electrolytes WNL  Chest pain since resolved.   Other Stroke Risk Factors  Morbid Obesity, Body mass index is 45.41 kg/m., recommend weight loss, diet and exercise as appropriate   Other Active Problems  Thrombocytosis, resolved - likely due to anemia, 385  Oral thrush, resolved on nystatin. Continue frequent mouth care  Left arm pain. B shoulder pain. Pain mgt, k pad. No inc in WBC, TM 99.4. [place] with positive superficial vein thrombosis in the L Cephalic vein. Treat with ASA. AC not  indication. Continue. Muscle rub as added to pain regimen  Contact precautions for ESBL  Hospital day # 87    plan continue gabapentin to 3 times daily. Tracheostomy tube to be plugged as per CCM. Patient medically stable to transfer to skilled nursing facility but no bed available as she is on difficult to place list. Antony Contras, MD Medical Director [facility] Pager: [phone] 11/09/2018 2:42 PM

## 2018-11-09 NOTE — Plan of Care (Signed)
Patient stable, discussed POC with patient, agreeable with plan. Trach capped, tol well, denies question/concerns at this time.

## 2018-11-10 LAB — GLUCOSE, CAPILLARY
Glucose-Capillary: 118 mg/dL — ABNORMAL HIGH (ref 70–99)
Glucose-Capillary: 154 mg/dL — ABNORMAL HIGH (ref 70–99)
Glucose-Capillary: 108 mg/dL — ABNORMAL HIGH (ref 70–99)
Glucose-Capillary: 154 mg/dL — ABNORMAL HIGH (ref 70–99)

## 2018-11-10 MED ORDER — LISINOPRIL 20 MG PO TABS
20.0000 mg | ORAL_TABLET | Freq: Every day | ORAL | Status: DC
  Administered 2018-11-10 – 2018-11-13 (×4): 20 mg via ORAL
  Filled 2018-11-10 (×4): qty 1

## 2018-11-10 NOTE — Progress Notes (Signed)
Nutrition Follow-up  DOCUMENTATION CODES:   Morbid obesity  INTERVENTION:  Continue 30 ml Prostat TID, each supplement provides 100 kcal and 15 grams of protein.   Continue free water flushes of 200 ml QID per tube. (MD to adjust as appropriate)  Encourage adequate PO intake.   NUTRITION DIAGNOSIS:   Inadequate oral intake related to inability to eat as evidenced by NPO status; diet advanced; improving  GOAL:   Patient will meet greater than or equal to 90% of their needs; progressing  MONITOR:   PO intake, Supplement acceptance, Diet advancement, Weight trends, Labs, Skin, I & O's  REASON FOR ASSESSMENT:   Consult Enteral/tube feeding initiation and management  ASSESSMENT:   Andrea [name] is a 58 yo female with PMH of type 2 diabetes, HTN, CKD III, obesity, and anemia admitted for chest pain. Code Stroke called 10/10 in AM. 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 Intubated 10/10 in ICU.  10/21 trach and PEG.11/1 ex lap, I&D.  Trach currently capped. Per MD, possible plans to remove trach later this week or next week if pt able to tolerate capping trial. Diet has been advanced to a dysphagia 2 diet with nectar thick liquids. Meal completion has been 50-75%. Intake has improved. Tube feeding has been discontinued. Pt continues on Prostat to aid in adequate protein needs. Free water flushes in place for adequate hydration as pt on nectar thick liquids. MD to adjust as appropriate.   Labs and medications reviewed.   Diet Order:   Diet Order            DIET DYS 2 Room service appropriate? Yes; Fluid consistency: Nectar Thick  Diet effective now              EDUCATION NEEDS:   Not appropriate for education at this time  Skin:  Skin Assessment: Skin Integrity Issues: Skin Integrity Issues:: Stage III, Incisions Stage II: N/A Stage III: sacrum Incisions: abdomen  Last BM:  1/2  Height:   Ht Readings from Last 1 Encounters:  09/03/18 5\' 4"  (1.626 m)     Weight:   Wt Readings from Last 1 Encounters:  10/25/18 120 kg    Ideal Body Weight:  54.5 kg  BMI:  Body mass index is 45.41 kg/m.  Estimated Nutritional Needs:   Kcal:  1600-1800  Protein:  100-120 grams  Fluid:  > 1.6 L    Corrin Parker, MS, RD, LDN Pager # [phone] After hours/ weekend pager # [phone]

## 2018-11-10 NOTE — Progress Notes (Signed)
Pt remains anxious and tearful, insisting she can't breath.  Spo2 92-93%.  BS relatively clear.  Suctioned via trach per pt request.  Suctioned scant amt of thin white secretions.  Pt placed on 2 lpm [place] for comfort.  Will cont to monitor.

## 2018-11-10 NOTE — Progress Notes (Signed)
STROKE TEAM PROGRESS NOTE   SUBJECTIVE (INTERVAL HISTORY) Pt mom is at bedside. Pt awake alert and talking with trach capped. Overnight as per note, she is anxious and stated not able to breath. However, good O2 saturation and very scant amount of thin white secretions. This morning during round, she had no complains, BS mild rhonchi but no gurgling sound.   OBJECTIVE Vitals:   11/10/18 0033 11/10/18 0037 11/10/18 0406 11/10/18 0445  BP:  139/80 (!) 163/92   Pulse:  88 90   Resp:  18 18   Temp:  98.2 F (36.8 C) 99 F (37.2 C)   TempSrc:  Oral Oral   SpO2: 95% 94% 94% 97%  Weight:      Height:        CBC:  Recent Labs  Lab 11/07/18 0547  WBC 9.0  HGB 10.5*  HCT 34.1*  MCV 86.1  PLT 414*    Basic Metabolic Panel:  Recent Labs  Lab 11/07/18 0547  NA 139  K 3.7  CL 107  CO2 24  GLUCOSE 106*  BUN 24*  CREATININE 0.73  CALCIUM 12.4*    IMAGING No results found.    PHYSICAL EXAM  General -  Obese middle-aged African-American lady, on trach but capped, not in distress. Her abd is soft, nondistended and nontender. PEG tube in place. BS mild rhonchi but no crackles. Cardiovascular - regular rate and rhythm Neurological Exam - on trach but capped, eyes open, awake, alert. No ptosis or EOMI, denies diplopia.  PERRL. Bilateral facial weakness, tongue protrusion weak. Able to wiggle left hand fingers and left foot toes. 2-/5 LLE proximal and 2/5 distally and 2/5 LUE.  Right-sided hemiplegia- starting to have some mild muscular contractions with effort in R hand, not in R foot. Follows commands and good eye contact.  Speech is moderate dysarthric with speaking valve. No aphasia. Sensation intact to touch, cold. Gait not tested.    ASSESSMENT/PLAN Andrea [name] is a 58 y.o. female with history of difficult to control hypertension, insulin-dependent diabetes, obesity, hypothyroidism status post ablation presenting with chest discomfort, right-sided weakness, slurred  speech and right facial droop. She did not receive IV t-PA due to late presentation. S/P stent assisted angioplasty of distal basilar artery.  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 Worsening symptoms with extension of pontine/medullary infarcts with new small bilateral cerebellar infarcts without evidence of stent re-stenosis or occlusion  Remains stable  VTE prophylaxis - heparin subq  on aspirin 81 mg daily PTA. Now on Brilinta 90 mg bid and ASA 81 mg daily.   Therapy recommendations:  SNF  Disposition:  SNF  Medically ready for d/c  D/c paperwork completed 10/14/18  Abnormal uterine bleeding, stable  Has been following with GYN  Was on megace in the past  CT abd/pelvis 09/03/18 - enlarged fibroid uterus  continue megace 80mg  bid  Continue ASA and brilinta   OBGYN recs   Received primarin 25mg  IV once - vaginal bleeding much improved  Started to have mild vaginal bleeding again 11/06/18, H&H stable so far, continue megace but will consider IV primarin if necessary.   Anemia, resolved  Hb 6.8->PRBC->7.1->10.3->9.7->10.5  Likely due to iron deficiency, heavy periods and acute uterine bleeding and abdominal wall surgeries  Iron panel showed iron deficiency  On iron solutions  PRBC transfusion 1 U 09/18/2018 and 2U 09/07/18 and 2U 09/21/18  CBC in am for monitoring given vaginal bleeding  Intracranial stenosis  BA mid to distal severe stenosis s/p BA stenting  Left PCA severe stenosis, R PCA moderate stenosis  Left MCA moderate stenosis  Uncontrolled stroke risk factors with HLD, HTN, DM  Non compliance with meds at home  Respiratory failure  S/p trach 09/02/2018  Able to speak with [name] valve although dysarthric  Trach capped and CCM on board  Follow up with CCM regarding the timing of trach removal  Hypertension  On the high end - 150-170s  BP goal 130-160s  On Coreg 25 bid, amlodipine 10  and hydralazine 75 mg every 6 hours  Add lisinopril 20  Hyperlipidemia  Lipid lowering medication PTA:  Lipitor 20 mg daily  LDL 174, goal < 70  Now on Lipitor 80 mg daily  Continue statin at discharge  Diabetes  HgbA1c 11.4, goal < 7.0  Lantus 17u BID  Basal NovoLog to 3U 4 times a day with Vital AF feeding - this was d/c'd 12/26 at 2234 - continue to hold for now d/t normalized glucoses  SSI  QAC+QHS  CBG monitoring QAC+QHS  Hypernatremia -> hyponatremia-> hypernatremia->normal, resolved  Na 139  On vital AF 4 times a day  On free water 200cc qid  Dysphagia s/p PEG complicated by abdominal wall abscess, resolved  PEG done 08/31/2018  Removed and replaced dislodged PEG 08/19/2018  CT showed Abdominal wall abscess - s/p open surgical drainage 08/19/2018 persistent foul odor discharge. Reexploration 09/17/2018  Wound debrided appropriately and sutures removed.  No evisceration  12/24 - new abdominal pain during the night. Seen by trauma. CT abd/pelvis unremarkable.  No surgical, acute issues. They signed off.  On supplement 3x day  Diet increased from dysphagia 1 to Dysphagia 2 diet with nectar thick liquids  Atypical chest pain, resolved  sternal chest pain 10/19/18 after bouts of coughing.   Tele no changes.   Troponin < 0.03.   CXR negative.   Electrolytes WNL  Chest pain since resolved.   Other Stroke Risk Factors  Morbid Obesity, Body mass index is 45.41 kg/m., recommend weight loss, diet and exercise as appropriate   Other Active Problems  Thrombocytosis, resolved - likely due to anemia, 385  Oral thrush, resolved on nystatin. Continue frequent mouth care  Left arm pain. B shoulder pain. Pain mgt, k pad. No inc in WBC, TM 99.4. [place] with positive superficial vein thrombosis in the L Cephalic vein. Treat with ASA. AC not indication. Continue. Muscle rub as added to pain regimen  Contact precautions for ESBL  Hospital day # 58    Andrea Hawking,  MD PhD Stroke Neurology 11/10/2018 12:07 PM

## 2018-11-11 ENCOUNTER — Inpatient Hospital Stay (HOSPITAL_COMMUNITY): Payer: Medicaid Other

## 2018-11-11 LAB — BASIC METABOLIC PANEL
Anion gap: 11 (ref 5–15)
BUN: 39 mg/dL — ABNORMAL HIGH (ref 6–20)
CHLORIDE: 108 mmol/L (ref 98–111)
CO2: 22 mmol/L (ref 22–32)
Calcium: 14.1 mg/dL (ref 8.9–10.3)
Creatinine, Ser: 1.21 mg/dL — ABNORMAL HIGH (ref 0.44–1.00)
GFR calc non Af Amer: 49 mL/min — ABNORMAL LOW (ref >60)
GFR, EST AFRICAN AMERICAN: 57 mL/min — AB (ref >60)
Glucose, Bld: 121 mg/dL — ABNORMAL HIGH (ref 70–99)
POTASSIUM: 3.6 mmol/L (ref 3.5–5.1)
Sodium: 141 mmol/L (ref 135–145)

## 2018-11-11 LAB — CBC
HCT: 36.3 % (ref 36.0–46.0)
Hemoglobin: 11.1 g/dL — ABNORMAL LOW (ref 12.0–15.0)
MCH: 26 pg (ref 26.0–34.0)
MCHC: 30.6 g/dL (ref 30.0–36.0)
MCV: 85 fL (ref 80.0–100.0)
Platelets: 329 10*3/uL (ref 150–400)
RBC: 4.27 MIL/uL (ref 3.87–5.11)
RDW: 17.9 % — ABNORMAL HIGH (ref 11.5–15.5)
WBC: 10.5 10*3/uL (ref 4.0–10.5)
nRBC: 0 % (ref 0.0–0.2)

## 2018-11-11 LAB — GLUCOSE, CAPILLARY
GLUCOSE-CAPILLARY: 143 mg/dL — AB (ref 70–99)
Glucose-Capillary: 109 mg/dL — ABNORMAL HIGH (ref 70–99)
Glucose-Capillary: 131 mg/dL — ABNORMAL HIGH (ref 70–99)
Glucose-Capillary: 169 mg/dL — ABNORMAL HIGH (ref 70–99)

## 2018-11-11 MED ORDER — SODIUM CHLORIDE 0.9 % IV SOLN
INTRAVENOUS | Status: DC
  Administered 2018-11-11 – 2018-11-12 (×3): via INTRAVENOUS

## 2018-11-11 MED ORDER — CALCITONIN (SALMON) 200 UNIT/ML IJ SOLN
400.0000 [IU] | Freq: Two times a day (BID) | INTRAMUSCULAR | Status: AC
  Administered 2018-11-11 – 2018-11-13 (×4): 400 [IU] via INTRAMUSCULAR
  Filled 2018-11-11 (×5): qty 2

## 2018-11-11 NOTE — Progress Notes (Signed)
Maryanna Shape progress note      Brief Summary: 58 year old female who had a pontine infarct 08/11/2018 prolonged intensive care stay requiring tracheostomy.  She is now reached maximal hospital benefit for tracheostomy with recommendation to decannulate today 11/11/2018   S:  No distress having been tracheostomy capped for 4 days.  O: BP 134/78 (BP Location: Left Arm)   Pulse 89   Temp 98.6 F (37 C) (Axillary)   Resp 20   Ht 5\' 4"  (1.626 m)   Wt 120 kg   LMP 07/26/2018   SpO2 97%   BMI 45.41 kg/m  General: 58 year old female in a weakened state, tracheostomy in place, which is understandable. HEENT: #5 cuffless trach in place Since 11/07/2017 Neuro: Speech is understandable at times. CV: Heart sounds are distant PULM: Diminished breath sounds in the bases IT:JLLV, non-tender, bsx4 active  Extremities: warm/dry, 1+ edema  Skin: no rashes or lesions   Recent Labs  Lab 11/07/18 0547 11/11/18 0611  HGB 10.5* 11.1*  HCT 34.1* 36.3  WBC 9.0 10.5  PLT 414* 329   Recent Labs  Lab 11/07/18 0547 11/11/18 0611  NA 139 141  K 3.7 3.6  CL 107 108  CO2 24 22  GLUCOSE 106* 121*  BUN 24* 39*  CREATININE 0.73 1.21*  CALCIUM 12.4* 14.1*   A:  Status post pontine cerebrovascular accident and right hemiplegia, dysphasia, PEG with tracheostomy is been capped since 11/07/2017  P:  Recommended decannulation 11/11/2018 Recommend she remains in the hospital at least 24 hours post decannulation to ensure no complications prior to being transferred to a skilled nursing facility   [facility] ACNP [facility] PCCM Pager [phone] till 1 pm If no answer page [phone] 11/11/2018, 9:25 AM

## 2018-11-11 NOTE — Progress Notes (Signed)
  Speech Language Pathology Treatment: Dysphagia  Patient Details Name: Andrea [name] MRN: [id number] DOB: 1961/05/04 Today's Date: 11/11/2018 Time: 1761-6073 SLP Time Calculation (min) (ACUTE ONLY): 12 min  Assessment / Plan / Recommendation Clinical Impression  Pt finishing breakfast; she is tolerating a dysphagia 2 diet with nectar-thick liquids well with no observed s/s of aspiration, improved oral manipulation/effort.  Breath sounds are diminished/clear; has not had a CXR since 12/15.  [name] has been capped for several days and staff is anticipating decannulation.  Pt's voice remains hypophonic, requiring occasional cues to increase respiratory support for speech.    Plan is to repeat instrumental swallow study- if she is decannulated in the next few days, will plan for MBS after trach is removed.  Pt agrees with plan.    HPI HPI: 58 yo presented to ED with chest pain noted Rt hemiparesis in ED with acute Left paramedian brainstem infarct. Intubated 10/10 for angioplasty, extubated post procedure and reintubated. Repeat MRI with brainstem infarct extension and bil cerebellar infarcts. Peg/trach 08/16/2018. FEES 09/15/18, 09/24/18, 12/9.  Dysphagia 1 diet with nectar-thick liquids initiated after last FEES.  PMHx: HTN, DM, CKD      SLP Plan  MBS;Continue with current plan of care       Recommendations  Diet recommendations: Nectar-thick liquid;Dysphagia 2 (fine chop) Liquids provided via: Straw Medication Administration: Crushed with puree Supervision: Full supervision/cueing for compensatory strategies Compensations: Minimize environmental distractions;Small sips/bites Postural Changes and/or Swallow Maneuvers: Seated upright 90 degrees;Upright 30-60 min after meal                Oral Care Recommendations: Oral care BID Follow up Recommendations: Skilled Nursing facility SLP Visit Diagnosis: Dysphagia, oropharyngeal phase (R13.12) Plan: MBS;Continue with current plan of  care       GO                [name] 11/11/2018, 8:56 AM  Estill Bamberg L. Tivis Ringer, [place] Office number [phone] Pager [phone]

## 2018-11-11 NOTE — Social Work (Signed)
Pt updates sent to [facility] [phone].  They are following to see if pt is able to have trach removed for potential LOG.  CSW continuing to follow for support with disposition.  Westley Hummer, MSW, [place] Work [phone]

## 2018-11-11 NOTE — Progress Notes (Addendum)
CRITICAL VALUE ALERT  Critical Value: Calcium  14.1  Date & Time Notied:  11/11/2018 @ 0721  Provider Notified: Dr. Lorraine Lax @ [phone], Dr. Leonie Man & Burnetta Sabin, NP @ [phone]  Orders Received/Actions taken: acknowledged results.

## 2018-11-11 NOTE — Progress Notes (Addendum)
Physical Therapy Treatment Patient Details Name: Andrea [name] MRN: [id number] DOB: 1961/04/01 Today's Date: 11/11/2018    History of Present Illness Pt is a 58 y/o female who presented to ED with chest pain noted Rt hemiparesis in ED with acute Left paramedian brainstem infarct. Intubated 10/10 for angioplasty, extubated post procedure and reintubated. Repeat MRI with brainstem infarct extension and bil cerebellar infarcts. Peg/trach with return to vent 08/30/2018. 10/22-29 on trach collar, return to vent 10/29. 10/28 I & D of abdominal wall abscess with placement of penrose drain and G-tube, 11/1 repeat ex lap. 11/7 return to trach collar.  PMHx: HTN, DM, CKD    PT Comments    Pt still is making little progress overall.  Completed several rolls with total assist during pericare and positioning the lift pad.  Used maxisky to transfer pt into the recliner.  Completed LE ROM and stretching, with little discernible movement on either LE's.  PROM to bil LE.   Follow Up Recommendations  SNF;Supervision/Assistance - 24 hour     Equipment Recommendations  Hospital bed;Wheelchair (measurements PT)    Recommendations for Other Services       Precautions / Restrictions Precautions Precautions: Fall Precaution Comments: Peg Restrictions Weight Bearing Restrictions: No    Mobility  Bed Mobility Overal bed mobility: Needs Assistance Bed Mobility: Rolling Rolling: Total assist;+2 for physical assistance         General bed mobility comments: total A x2 to roll bilaterally multiple times for pad placement in preparation for transfer with maximove  Transfers Overall transfer level: Needs assistance               General transfer comment: lifted OOB to chair via maximove  Ambulation/Gait                 Stairs             Wheelchair Mobility    Modified Rankin (Stroke Patients Only)       Balance Overall balance assessment: Needs  assistance Sitting-balance support: Feet supported;Bilateral upper extremity supported Sitting balance-Leahy Scale: Poor Sitting balance - Comments: Worked in chair on balance and pt requires total assist. Without being propped, pt loses balance easily in chair and arms can slip off sides of chair if not put on pillows correctly.       Standing balance comment: NT                            Cognition Arousal/Alertness: Awake/alert Behavior During Therapy: WFL for tasks assessed/performed Overall Cognitive Status: Impaired/Different from baseline Area of Impairment: Memory;Attention                   Current Attention Level: Selective Memory: Decreased short-term memory Following Commands: Follows one step commands consistently;Follows multi-step commands with increased time   Awareness: Emergent   General Comments: Feel cognition is improving. Pt answering all questions appropriately.      Exercises Other Exercises Other Exercises: PROM to bil LE's  with heel cord stretch    General Comments General comments (skin integrity, edema, etc.): No movement noted in the LE's to correspond to small UE improvements      Pertinent Vitals/Pain Pain Assessment: Faces Faces Pain Scale: Hurts even more Pain Location: arms and legs with ROM Pain Descriptors / Indicators: Discomfort;Grimacing;Guarding Pain Intervention(s): Limited activity within patient's tolerance    Home Living  Prior Function            PT Goals (current goals can now be found in the care plan section) Acute Rehab PT Goals Patient Stated Goal: to be able to help feed myself PT Goal Formulation: With patient Time For Goal Achievement: 11/25/18 Potential to Achieve Goals: Fair Progress towards PT goals: Not progressing toward goals - comment(no functional change)    Frequency    Min 2X/week      PT Plan Current plan remains appropriate     Co-evaluation PT/OT/SLP Co-Evaluation/Treatment: Yes Reason for Co-Treatment: Complexity of the patient's impairments (multi-system involvement) PT goals addressed during session: Mobility/safety with mobility OT goals addressed during session: Strengthening/ROM      AM-PAC PT "6 Clicks" Mobility   Outcome Measure  Help needed turning from your back to your side while in a flat bed without using bedrails?: Total Help needed moving from lying on your back to sitting on the side of a flat bed without using bedrails?: Total Help needed moving to and from a bed to a chair (including a wheelchair)?: Total Help needed standing up from a chair using your arms (e.g., wheelchair or bedside chair)?: Total Help needed to walk in hospital room?: Total Help needed climbing 3-5 steps with a railing? : Total 6 Click Score: 6    End of Session   Activity Tolerance: Patient tolerated treatment well Patient left: in chair;with call bell/phone within reach;with family/visitor present Nurse Communication: Mobility status;Need for lift equipment PT Visit Diagnosis: Other abnormalities of gait and mobility (R26.89);Muscle weakness (generalized) (M62.81);Other symptoms and signs involving the nervous system (R29.898);Hemiplegia and hemiparesis Hemiplegia - Right/Left: Right Hemiplegia - dominant/non-dominant: Dominant Hemiplegia - caused by: Cerebral infarction     Time: 9811-9147 PT Time Calculation (min) (ACUTE ONLY): 40 min  Charges:  $Therapeutic Activity: 23-37 mins                     11/11/2018  Donnella Sham, PT Acute Rehabilitation Services [phone]  (pager) [phone]  (office)   Andrea [name] 11/11/2018, 4:28 PM

## 2018-11-11 NOTE — Progress Notes (Signed)
Occupational Therapy Treatment Patient Details Name: Andrea [name] MRN: [id number] DOB: February 20, 1961 Today's Date: 11/11/2018    History of present illness Pt is a 58 y/o female who presented to ED with chest pain noted Rt hemiparesis in ED with acute Left paramedian brainstem infarct. Intubated 10/10 for angioplasty, extubated post procedure and reintubated. Repeat MRI with brainstem infarct extension and bil cerebellar infarcts. Peg/trach with return to vent 08/07/2018. 10/22-29 on trach collar, return to vent 10/29. 10/28 I & D of abdominal wall abscess with placement of penrose drain and G-tube, 11/1 repeat ex lap. 11/7 return to trach collar.  PMHx: HTN, DM, CKD   OT comments  Pt making slow to little progress at this point. Pt did get up in chair today via [place] but is limited due to pain in BUS and BLEs. Pt with very little active movement noted in extremities outside of some in L hand.  Will attempt to position pt well and see if there is a possibility she can assist with feeding with adaptive equipment.  Will continue to follow.     Follow Up Recommendations  SNF;Supervision/Assistance - 24 hour    Equipment Recommendations       Recommendations for Other Services      Precautions / Restrictions Precautions Precautions: Fall Precaution Comments: Peg Restrictions Weight Bearing Restrictions: No       Mobility Bed Mobility Overal bed mobility: Needs Assistance Bed Mobility: Rolling Rolling: Total assist;+2 for physical assistance         General bed mobility comments: total A x2 to roll bilaterally multiple times for pad placement in preparation for transfer with maximove  Transfers Overall transfer level: Needs assistance               General transfer comment: lifted OOB to chair via maximove    Balance Overall balance assessment: Needs assistance Sitting-balance support: Feet supported;Bilateral upper extremity supported Sitting balance-Leahy Scale:  Poor Sitting balance - Comments: Worked in chair on balance and pt requires total assist. Without being propped, pt loses balance easily in chair and arms can slip off sides of chair if not put on pillows correctly.       Standing balance comment: NT                           ADL either performed or assessed with clinical judgement   ADL Overall ADL's : Needs assistance/impaired Eating/Feeding: Maximal assistance Eating/Feeding Details (indicate cue type and reason): Needs to be set up perfectly to feed self.  Pt ready to start trying some adaptive handles on utensils and prop elbows while eating with LUE. Grooming: Maximal assistance;Wash/dry face;Bed level Grooming Details (indicate cue type and reason): Pt with difficulty getting hand to mouth and sustaining it there to was.                     Toileting- Clothing Manipulation and Hygiene: Total assistance;+2 for physical assistance;Bed level Toileting - Clothing Manipulation Details (indicate cue type and reason): soiled upon arrival, total assist to complete hygiene     Functional mobility during ADLs: Total assistance;+2 for physical assistance General ADL Comments: overall total assist with ADLs      Vision   Vision Assessment?: Vision impaired- to be further tested in functional context   Perception     Praxis      Cognition Arousal/Alertness: Awake/alert Behavior During Therapy: WFL for tasks assessed/performed Overall Cognitive Status: Impaired/Different  from baseline Area of Impairment: Memory;Attention                   Current Attention Level: Selective Memory: Decreased short-term memory Following Commands: Follows one step commands consistently;Follows multi-step commands with increased time   Awareness: Emergent   General Comments: Feel cognition is improving. Pt answering all questions appropriately.        Exercises General Exercises - Upper Extremity Elbow Flexion:  PROM;Both;5 reps;Seated Elbow Extension: PROM;Both;5 reps;Seated Digit Composite Flexion: PROM;Right;5 reps;Seated Composite Extension: PROM;Right;5 reps;Seated   Shoulder Instructions       General Comments Pt remains very limited with adls requiring overally total assist. Pt with some movement in LUE but very little to be functional.      Pertinent Vitals/ Pain       Pain Assessment: Faces Faces Pain Scale: Hurts even more Pain Location: arms and legs with ROM Pain Descriptors / Indicators: Discomfort;Grimacing;Guarding Pain Intervention(s): Limited activity within patient's tolerance;Monitored during session;Repositioned  Home Living                                          Prior Functioning/Environment              Frequency  Min 2X/week        Progress Toward Goals  OT Goals(current goals can now be found in the care plan section)  Progress towards OT goals: Not progressing toward goals - comment(Progress limited due to very little active movment available)  Acute Rehab OT Goals Patient Stated Goal: to be able to help feed myself OT Goal Formulation: With patient/family Time For Goal Achievement: 11/25/18 Potential to Achieve Goals: Fair ADL Goals Pt Will Perform Eating: with mod assist;with adaptive utensils;with assist to don/doff brace/orthosis;sitting Pt Will Perform Grooming: with max assist;bed level Pt Will Perform Upper Body Bathing: with max assist;bed level Pt/caregiver will Perform Home Exercise Program: Increased ROM;With written HEP provided;Both right and left upper extremity Additional ADL Goal #1: Pt will sit EOB with +2 Max A x 10 min in preparation for functional activities. Additional ADL Goal #2: Pt will consistently use soft touch call bell 4/4 trials to increase ability to manage self care. Additional ADL Goal #3: Pt will tolerate 2 hrs out of bed in whellchair to increase activity tolerance. Additional ADL Goal #4: Pt  will dmeonstrate to pick up washcloth with L hand to increase functional use L UE for ADL  Plan Discharge plan remains appropriate;Frequency remains appropriate    Co-evaluation    PT/OT/SLP Co-Evaluation/Treatment: Yes Reason for Co-Treatment: Complexity of the patient's impairments (multi-system involvement);For patient/therapist safety PT goals addressed during session: Mobility/safety with mobility OT goals addressed during session: Strengthening/ROM      AM-PAC OT "6 Clicks" Daily Activity     Outcome Measure   Help from another person eating meals?: A Lot Help from another person taking care of personal grooming?: A Lot Help from another person toileting, which includes using toliet, bedpan, or urinal?: Total Help from another person bathing (including washing, rinsing, drying)?: Total Help from another person to put on and taking off regular upper body clothing?: Total Help from another person to put on and taking off regular lower body clothing?: Total 6 Click Score: 8    End of Session    OT Visit Diagnosis: Other abnormalities of gait and mobility (R26.89);Muscle weakness (generalized) (M62.81);Low vision, both eyes (  H54.2);Feeding difficulties (R63.3);Other symptoms and signs involving cognitive function;Hemiplegia and hemiparesis Hemiplegia - Right/Left: Right Hemiplegia - dominant/non-dominant: Non-Dominant Hemiplegia - caused by: Nontraumatic intracerebral hemorrhage;Cerebral infarction Pain - Right/Left: Right Pain - part of body: Shoulder;Arm;Hand;Hip;Knee;Leg;Ankle and joints of foot   Activity Tolerance Patient tolerated treatment well   Patient Left in chair;with call bell/phone within reach   Nurse Communication Mobility status        Time: 5501-5868 OT Time Calculation (min): 37 min  Charges: OT General Charges $OT Visit: 1 Visit OT Treatments $Therapeutic Exercise: 8-22 mins  Jinger Neighbors, OTR/L [phone]   Glenford Peers 11/11/2018, 12:46  PM

## 2018-11-11 NOTE — Progress Notes (Addendum)
STROKE TEAM PROGRESS NOTE   SUBJECTIVE (INTERVAL HISTORY) Pt did get up in chair today via [place] but is limited due to pain in BUS and BLEs Pt awake alert and talking with trach now decanulated this am..  Her serum calcium is critically elevated at 14.1 OBJECTIVE Vitals:   11/11/18 0846 11/11/18 0912 11/11/18 1055 11/11/18 1218  BP: 134/78   112/64  Pulse: 76 89 88 71  Resp: 20 20 18 20   Temp: 98.6 F (37 C)   99.4 F (37.4 C)  TempSrc: Axillary   Axillary  SpO2: 98% 97%  96%  Weight:      Height:        CBC:  Recent Labs  Lab 11/07/18 0547 11/11/18 0611  WBC 9.0 10.5  HGB 10.5* 11.1*  HCT 34.1* 36.3  MCV 86.1 85.0  PLT 414* 967    Basic Metabolic Panel:  Recent Labs  Lab 11/07/18 0547 11/11/18 0611  NA 139 141  K 3.7 3.6  CL 107 108  CO2 24 22  GLUCOSE 106* 121*  BUN 24* 39*  CREATININE 0.73 1.21*  CALCIUM 12.4* 14.1*    IMAGING Dg Chest Port 1 View  Result Date: 11/11/2018 CLINICAL DATA:  Cough. EXAM: PORTABLE CHEST 1 VIEW COMPARISON:  Radiograph of October 19, 2018. FINDINGS: Stable cardiomediastinal silhouette. Tracheostomy tube is in grossly good position. No pneumothorax is noted. Right lung is clear. Elevated left hemidiaphragm is noted with mild left basilar subsegmental atelectasis. No significant pleural effusion is noted. Bony thorax is unremarkable. IMPRESSION: Elevated left hemidiaphragm with mild left basilar subsegmental atelectasis. Electronically Signed   By: Marijo Conception, M.D.   On: 11/11/2018 10:42      PHYSICAL EXAM  General -  Obese middle-aged African-American lady, on trach now removed, not in distress. Her abd is soft, nondistended and nontender. PEG tube in place. BS mild rhonchi but no crackles. Cardiovascular - regular rate and rhythm Neurological Exam - on trach decanulatedd, eyes open, awake, alert. No ptosis or EOMI, denies diplopia.  PERRL. Bilateral facial weakness, tongue protrusion weak. Able to wiggle left hand fingers  and left foot toes. 2-/5 LLE proximal and 2/5 distally and 2/5 LUE.  Right-sided hemiplegia- starting to have some mild muscular contractions with effort in R hand, not in R foot. Follows commands and good eye contact.  Speech is moderate dysarthric with speaking valve. No aphasia. Sensation intact to touch, cold. Gait not tested.    ASSESSMENT/PLAN [name] is a 58 y.o. female with history of difficult to control hypertension, insulin-dependent diabetes, obesity, hypothyroidism status post ablation presenting with chest discomfort, right-sided weakness, slurred speech and right facial droop. She did not receive IV t-PA due to late presentation. S/P stent assisted angioplasty of distal basilar artery.  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 Worsening symptoms with extension of pontine/medullary infarcts with new small bilateral cerebellar infarcts without evidence of stent re-stenosis or occlusion  Remains stable  VTE prophylaxis - heparin subq  on aspirin 81 mg daily PTA. Now on Brilinta 90 mg bid and ASA 81 mg daily.   Therapy recommendations:  SNF  Disposition:  SNF  Medically ready for d/c  D/c paperwork completed 10/14/18  Abnormal uterine bleeding, stable  Has been following with GYN  Was on megace in the past  CT abd/pelvis 09/03/18 - enlarged fibroid uterus  continue megace 80mg  bid  Continue ASA and brilinta   OBGYN recs  Received primarin 25mg  IV once - vaginal bleeding much improved  Started to have mild vaginal bleeding again 11/06/18, H&H stable so far, continue megace but will consider IV primarin if necessary.   Anemia, resolved  Hb 6.8->PRBC->7.1->10.3->9.7->10.5  Likely due to iron deficiency, heavy periods and acute uterine bleeding and abdominal wall surgeries  Iron panel showed iron deficiency  On iron solutions  PRBC transfusion 1 U 09/17/2018 and 2U 09/07/18 and 2U 09/21/18  CBC in am  for monitoring given vaginal bleeding  Intracranial stenosis  BA mid to distal severe stenosis s/p BA stenting  Left PCA severe stenosis, R PCA moderate stenosis  Left MCA moderate stenosis  Uncontrolled stroke risk factors with HLD, HTN, DM  Non compliance with meds at home  Respiratory failure  S/p trach 08/15/2018  Able to speak with [name] valve although dysarthric  Trach capped and CCM on board  Follow up with CCM regarding the timing of trach removal  Hypertension  On the high end - 150-170s  BP goal 130-160s  On Coreg 25 bid, amlodipine 10 and hydralazine 75 mg every 6 hours  Add lisinopril 20  Hyperlipidemia  Lipid lowering medication PTA:  Lipitor 20 mg daily  LDL 174, goal < 70  Now on Lipitor 80 mg daily  Continue statin at discharge  Diabetes  HgbA1c 11.4, goal < 7.0  Lantus 17u BID  Basal NovoLog to 3U 4 times a day with Vital AF feeding - this was d/c'd 12/26 at 2234 - continue to hold for now d/t normalized glucoses  SSI  QAC+QHS  CBG monitoring QAC+QHS  Hypernatremia -> hyponatremia-> hypernatremia->normal, resolved  Na 139  On vital AF 4 times a day  On free water 200cc qid  Dysphagia s/p PEG complicated by abdominal wall abscess, resolved  PEG done 08/19/2018  Removed and replaced dislodged PEG 08/31/2018  CT showed Abdominal wall abscess - s/p open surgical drainage 08/29/2018 persistent foul odor discharge. Reexploration 09/14/2018  Wound debrided appropriately and sutures removed.  No evisceration  12/24 - new abdominal pain during the night. Seen by trauma. CT abd/pelvis unremarkable.  No surgical, acute issues. They signed off.  On supplement 3x day  Diet increased from dysphagia 1 to Dysphagia 2 diet with nectar thick liquids  Atypical chest pain, resolved  sternal chest pain 10/19/18 after bouts of coughing.   Tele no changes.   Troponin < 0.03.   CXR negative.   Electrolytes WNL  Chest pain since resolved.    Other Stroke Risk Factors  Morbid Obesity, Body mass index is 45.41 kg/m., recommend weight loss, diet and exercise as appropriate   Other Active Problems  Thrombocytosis, resolved - likely due to anemia, 385  Oral thrush, resolved on nystatin. Continue frequent mouth care  Left arm pain. B shoulder pain. Pain mgt, k pad. No inc in WBC, TM 99.4. [place] with positive superficial vein thrombosis in the L Cephalic vein. Treat with ASA. AC not indication. Continue. Muscle rub as added to pain regimen  Contact precautions for ESBL  Hypercalcemia-etiology unclear  Hospital day # 89  Plan trach decannulation today.Close observation  for 24 hours.check vitamin D and parathyroid hormone levels and start calcitonin for treatment for hypercalcemia. Hopefully transfer to skilled nursing facility for rehabilitation over the next few days Greater than 50% and during this 25 minute visit was spent on coordination of care and discussion with care team. Antony Contras, MD Stroke Neurology 11/11/2018 3:11 PM

## 2018-11-12 DIAGNOSIS — Z930 Tracheostomy status: Secondary | ICD-10-CM

## 2018-11-12 LAB — BASIC METABOLIC PANEL
Anion gap: 8 (ref 5–15)
BUN: 46 mg/dL — ABNORMAL HIGH (ref 6–20)
CO2: 22 mmol/L (ref 22–32)
Calcium: 12.4 mg/dL — ABNORMAL HIGH (ref 8.9–10.3)
Chloride: 110 mmol/L (ref 98–111)
Creatinine, Ser: 1.24 mg/dL — ABNORMAL HIGH (ref 0.44–1.00)
GFR calc Af Amer: 55 mL/min — ABNORMAL LOW (ref >60)
GFR, EST NON AFRICAN AMERICAN: 48 mL/min — AB (ref >60)
Glucose, Bld: 102 mg/dL — ABNORMAL HIGH (ref 70–99)
Potassium: 3.3 mmol/L — ABNORMAL LOW (ref 3.5–5.1)
Sodium: 140 mmol/L (ref 135–145)

## 2018-11-12 LAB — GLUCOSE, CAPILLARY
GLUCOSE-CAPILLARY: 93 mg/dL (ref 70–99)
Glucose-Capillary: 96 mg/dL (ref 70–99)
Glucose-Capillary: 85 mg/dL (ref 70–99)
Glucose-Capillary: 100 mg/dL — ABNORMAL HIGH (ref 70–99)

## 2018-11-12 NOTE — Progress Notes (Signed)
Speech Pathology:  Note pt was decannulated 1/7.  Will plan for MBS next date, 11/24/2018.  Silvina Hackleman L. Tivis Ringer, [place] Office number [phone] Pager [phone]

## 2018-11-12 NOTE — Progress Notes (Signed)
  Stoma cleansed, dried and dressing applied. No drainage.

## 2018-11-12 NOTE — Progress Notes (Signed)
STROKE TEAM PROGRESS NOTE   SUBJECTIVE (INTERVAL HISTORY) Pt is sitting up in bed today via  She is  awake alert and talking with trach now decanulated this am..  Her serum calcium is yet elevated at 12.4.serum PTH and vitamin D levels are pending OBJECTIVE Vitals:   11/12/18 0432 11/12/18 0650 11/12/18 0803 11/12/18 1210  BP:  134/73 125/65 140/83  Pulse: 73  84 82  Resp: (!) 22  20 18   Temp:      TempSrc:      SpO2: 97%  92% 91%  Weight:      Height:        CBC:  Recent Labs  Lab 11/07/18 0547 11/11/18 0611  WBC 9.0 10.5  HGB 10.5* 11.1*  HCT 34.1* 36.3  MCV 86.1 85.0  PLT 414* 532    Basic Metabolic Panel:  Recent Labs  Lab 11/11/18 0611 11/12/18 0457  NA 141 140  K 3.6 3.3*  CL 108 110  CO2 22 22  GLUCOSE 121* 102*  BUN 39* 46*  CREATININE 1.21* 1.24*  CALCIUM 14.1* 12.4*    IMAGING No results found.    PHYSICAL EXAM  General -  Obese middle-aged African-American lady, on trach now removed, not in distress. Her abd is soft, nondistended and nontender. PEG tube in place. BS mild rhonchi but no crackles. Cardiovascular - regular rate and rhythm Neurological Exam - on trach decanulatedd, eyes open, awake, alert. No ptosis or EOMI, denies diplopia.  PERRL. Bilateral facial weakness, tongue protrusion weak. Able to wiggle left hand fingers and left foot toes. 2-/5 LLE proximal and 2/5 distally and 2/5 LUE.  Right-sided hemiplegia- starting to have some mild muscular contractions with effort in R hand, not in R foot. Follows commands and good eye contact.  Speech is moderate dysarthric with speaking valve. No aphasia. Sensation intact to touch, cold. Gait not tested.    ASSESSMENT/PLAN [name] is a 58 y.o. female with history of difficult to control hypertension, insulin-dependent diabetes, obesity, hypothyroidism status post ablation presenting with chest discomfort, right-sided weakness, slurred speech and right facial droop. She did not receive IV  t-PA due to late presentation. S/P stent assisted angioplasty of distal basilar artery.  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 Worsening symptoms with extension of pontine/medullary infarcts with new small bilateral cerebellar infarcts without evidence of stent re-stenosis or occlusion  Remains stable  VTE prophylaxis - heparin subq  on aspirin 81 mg daily PTA. Now on Brilinta 90 mg bid and ASA 81 mg daily.   Therapy recommendations:  SNF  Disposition:  SNF  Medically ready for d/c  D/c paperwork completed 10/14/18  Abnormal uterine bleeding, stable  Has been following with GYN  Was on megace in the past  CT abd/pelvis 09/03/18 - enlarged fibroid uterus  continue megace 80mg  bid  Continue ASA and brilinta   OBGYN recs   Received primarin 25mg  IV once - vaginal bleeding much improved  Started to have mild vaginal bleeding again 11/06/18, H&H stable so far, continue megace but will consider IV primarin if necessary.   Anemia, resolved  Hb 6.8->PRBC->7.1->10.3->9.7->10.5  Likely due to iron deficiency, heavy periods and acute uterine bleeding and abdominal wall surgeries  Iron panel showed iron deficiency  On iron solutions  PRBC transfusion 1 U 09/07/2018 and 2U 09/07/18 and 2U 09/21/18  CBC in am for monitoring given vaginal bleeding  Intracranial stenosis  BA mid to distal  severe stenosis s/p BA stenting  Left PCA severe stenosis, R PCA moderate stenosis  Left MCA moderate stenosis  Uncontrolled stroke risk factors with HLD, HTN, DM  Non compliance with meds at home  Respiratory failure  S/p trach 08/06/2018  Able to speak with Andrea [name] valve although dysarthric  Trach capped and CCM on board  Follow up with CCM regarding the timing of trach removal  Hypertension  On the high end - 150-170s  BP goal 130-160s  On Coreg 25 bid, amlodipine 10 and hydralazine 75 mg every 6 hours  Add lisinopril  20  Hyperlipidemia  Lipid lowering medication PTA:  Lipitor 20 mg daily  LDL 174, goal < 70  Now on Lipitor 80 mg daily  Continue statin at discharge  Diabetes  HgbA1c 11.4, goal < 7.0  Lantus 17u BID  Basal NovoLog to 3U 4 times a day with Vital AF feeding - this was d/c'd 12/26 at 2234 - continue to hold for now d/t normalized glucoses  SSI  QAC+QHS  CBG monitoring QAC+QHS  Hypernatremia -> hyponatremia-> hypernatremia->normal, resolved  Na 139  On vital AF 4 times a day  On free water 200cc qid  Dysphagia s/p PEG complicated by abdominal wall abscess, resolved  PEG done 08/05/2018  Removed and replaced dislodged PEG 09/04/2018  CT showed Abdominal wall abscess - s/p open surgical drainage 08/26/2018 persistent foul odor discharge. Reexploration 09/15/2018  Wound debrided appropriately and sutures removed.  No evisceration  12/24 - new abdominal pain during the night. Seen by trauma. CT abd/pelvis unremarkable.  No surgical, acute issues. They signed off.  On supplement 3x day  Diet increased from dysphagia 1 to Dysphagia 2 diet with nectar thick liquids  Atypical chest pain, resolved  sternal chest pain 10/19/18 after bouts of coughing.   Tele no changes.   Troponin < 0.03.   CXR negative.   Electrolytes WNL  Chest pain since resolved.   Other Stroke Risk Factors  Morbid Obesity, Body mass index is 45.41 kg/m., recommend weight loss, diet and exercise as appropriate   Other Active Problems  Thrombocytosis, resolved - likely due to anemia, 385  Oral thrush, resolved on nystatin. Continue frequent mouth care  Left arm pain. B shoulder pain. Pain mgt, k pad. No inc in WBC, TM 99.4. [place] with positive superficial vein thrombosis in the L Cephalic vein. Treat with ASA. AC not indication. Continue. Muscle rub as added to pain regimen  Contact precautions for ESBL  Hypercalcemia-etiology unclear- repeat calcitonin injection. Check PTH and vitamin D  levels  Hospital day # 90  Plan repeat calcitonin injection. Await vitamin D and parathyroid hormone levels  Hopefully check modified barium swallowing a few days and transfer to skilled nursing facility for rehabilitation over the next few days Greater than 50% and during this 25 minute visit was spent on coordination of care and discussion with care team. Antony Contras, MD Stroke Neurology 11/12/2018 1:30 PM

## 2018-11-12 NOTE — Progress Notes (Signed)
Andrea [name] progress note      Brief Summary: 58 year old female who had a pontine infarct 08/24/2018 prolonged intensive care stay requiring tracheostomy.  She was decannulated on 11/11/2018.  Has had no respiratory distress since.   S:  Decannulated on 11/11/2018 without any ensuing respiratory distress.  O: BP 125/65 (BP Location: Left Arm)   Pulse 84   Temp 98.7 F (37.1 C) (Oral)   Resp 20   Ht 5\' 4"  (1.626 m)   Wt 120 kg   LMP 07/26/2018   SpO2 92%   BMI 45.41 kg/m     General: 58 year old female who is reactive and follows commands HEENT: Tracheal stoma dressing dry and intact Neuro: Continues to have hemiplegia and dysarthria CV: Heart sounds are distant PULM: Decreased air movement, decreased breath sounds in the bases WI:OXBD, non-tender, bsx4 active  Extremities: warm/dry, 1+ edema  Skin: no rashes or lesions    Recent Labs  Lab 11/07/18 0547 11/11/18 0611  HGB 10.5* 11.1*  HCT 34.1* 36.3  WBC 9.0 10.5  PLT 414* 329   Recent Labs  Lab 11/07/18 0547 11/11/18 0611 11/12/18 0457  NA 139 141 140  K 3.7 3.6 3.3*  CL 107 108 110  CO2 24 22 22   GLUCOSE 106* 121* 102*  BUN 24* 39* 46*  CREATININE 0.73 1.21* 1.24*  CALCIUM 12.4* 14.1* 12.4*   A:  Status post pontine cerebrovascular accident and right hemiplegia, dysphasia, PEG with tracheostomy is been capped since 11/07/2017.  Decannulated 11/11/2018.  No further issues since decannulation.  P:  Decannulated on 11/11/2018 She has tolerated 24 hours of decannulation without issues. Continue dressings as needed suspect within 48 hours time will be closed would not require dressing [facility] critical care will sign off at this time.   Richardson Landry Minor ACNP Andrea [name] PCCM Pager [phone] till 1 pm If no answer page [phone] 11/12/2018, 9:00 AM

## 2018-11-12 NOTE — Plan of Care (Signed)
  Problem: Health Behavior/Discharge Planning: Goal: Ability to manage health-related needs will improve Outcome: Progressing   Problem: Clinical Measurements: Goal: Ability to maintain clinical measurements within normal limits will improve Outcome: Progressing   Problem: Clinical Measurements: Goal: Will remain free from infection Outcome: Progressing   

## 2018-11-13 ENCOUNTER — Inpatient Hospital Stay (HOSPITAL_COMMUNITY): Payer: Medicaid Other

## 2018-11-13 LAB — GLUCOSE, CAPILLARY
Glucose-Capillary: 106 mg/dL — ABNORMAL HIGH (ref 70–99)
Glucose-Capillary: 80 mg/dL (ref 70–99)
Glucose-Capillary: 136 mg/dL — ABNORMAL HIGH (ref 70–99)

## 2018-11-13 LAB — PHOSPHORUS: Phosphorus: 2.2 mg/dL — ABNORMAL LOW (ref 2.5–4.6)

## 2018-11-13 LAB — COMPREHENSIVE METABOLIC PANEL
ALT: 14 U/L (ref 0–44)
AST: 18 U/L (ref 15–41)
Albumin: 2.6 g/dL — ABNORMAL LOW (ref 3.5–5.0)
Alkaline Phosphatase: 51 U/L (ref 38–126)
Anion gap: 11 (ref 5–15)
BUN: 35 mg/dL — ABNORMAL HIGH (ref 6–20)
CO2: 17 mmol/L — ABNORMAL LOW (ref 22–32)
Calcium: 11 mg/dL — ABNORMAL HIGH (ref 8.9–10.3)
Chloride: 114 mmol/L — ABNORMAL HIGH (ref 98–111)
Creatinine, Ser: 0.93 mg/dL (ref 0.44–1.00)
GFR calc Af Amer: >60 mL/min (ref >60)
GFR calc non Af Amer: >60 mL/min (ref >60)
Glucose, Bld: 88 mg/dL (ref 70–99)
Potassium: 3.6 mmol/L (ref 3.5–5.1)
Sodium: 142 mmol/L (ref 135–145)
Total Bilirubin: 0.8 mg/dL (ref 0.3–1.2)
Total Protein: 7 g/dL (ref 6.5–8.1)

## 2018-11-13 LAB — PTH, INTACT AND CALCIUM
Calcium, Total (PTH): 12.2 mg/dL — ABNORMAL HIGH (ref 8.7–10.2)
PTH: 85 pg/mL — AB (ref 15–65)

## 2018-11-13 LAB — VITAMIN D 25 HYDROXY (VIT D DEFICIENCY, FRACTURES): Vit D, 25-Hydroxy: 29.9 ng/mL — ABNORMAL LOW (ref 30.0–100.0)

## 2018-11-13 LAB — TROPONIN I: Troponin I: 0.03 ng/mL (ref <0.03)

## 2018-11-13 MED ORDER — SODIUM CHLORIDE 0.9 % IV BOLUS: 1000.0000 mL | Freq: Once | INTRAVENOUS | Status: DC

## 2018-11-13 MED ORDER — SODIUM BICARBONATE 8.4 % IV SOLN
INTRAVENOUS | Status: AC
  Filled 2018-11-13: qty 50

## 2018-11-13 MED ORDER — ZOLEDRONIC ACID 4 MG/5ML IV CONC: 4.0000 mg | Freq: Once | INTRAVENOUS | Status: DC

## 2018-11-13 MED ORDER — SODIUM CHLORIDE 0.9 % IV SOLN
90.0000 mg | Freq: Once | INTRAVENOUS | Status: DC
  Filled 2018-11-13: qty 10

## 2018-11-13 MED ORDER — EPINEPHRINE PF 1 MG/10ML IJ SOSY
PREFILLED_SYRINGE | INTRAMUSCULAR | Status: AC
  Filled 2018-11-13: qty 20

## 2018-11-13 MED ORDER — CINACALCET HCL 30 MG PO TABS
30.0000 mg | ORAL_TABLET | Freq: Two times a day (BID) | ORAL | Status: DC
  Filled 2018-11-13: qty 1

## 2018-11-14 LAB — CALCIUM, IONIZED: Calcium, Ionized, Serum: 6.3 mg/dL — ABNORMAL HIGH (ref 4.5–5.6)

## 2018-11-14 MED FILL — Medication: Qty: 1 | Status: AC

## 2018-12-06 NOTE — Discharge Summary (Signed)
Patient ID: Andrea [name] MRN: [id number] DOB/AGE: 02-06-1961 58 y.o.  Admit date: 09/21/2018 Death date: 2018/12/07  Admission Diagnoses:Chest pain, left arm discomfort   Cause of Death:  Cardiac arrest with pulseless electrical activity rhythm  Pertinent Medical Diagnosis: Principal Problem:   Acute ischemic stroke ([place]) L paramed pontine d/t BA stenosis, B cerebellar infarcts s/p mechanical thrombectomy Active Problems:   Insulin-requiring or dependent type II diabetes mellitus ([place])   Essential hypertension   Chest pain   Fibroids   Abnormal uterine bleeding   Anemia, iron deficiency   Hypertensive urgency   CKD (chronic kidney disease), stage III ([facility])   Basilar artery stenosis with infarction ([place])   Acute respiratory insufficiency   Acute respiratory failure ([facility])   Tracheostomy present ([place])   Tracheostomy status ([place])   Sepsis with acute respiratory failure without septic shock ([place])   Goals of care, counseling/discussion   Palliative care by specialist   Advanced care planning/counseling discussion   Advance care planning   Functional diarrhea   Hypoxemia   Pressure injury of skin   Other constipation   Arthralgia of left upper arm   Hospital Course:  Andrea [name] is a 58 y.o. female with medical history significant for resistant hypertension, insulin-dependent diabetes mellitus, obesity, iron deficiency anemia, and hyperthyroidism status post ablation, now presenting to the emergency department for evaluation of chest pain and left arm discomfort.  Patient reports that she ran out of her medications approximately 3 days ago, but continued to do well until yesterday when she developed pain in her chest.  She reports a constant pressure sensation in the mid chest that began yesterday, has waxed and waned, associated with mild dyspnea and left arm discomfort, but no diaphoresis or nausea.  She is unable to identify any alleviating or exacerbating factors for this.   She has had similar chest discomfort previously, but not recently.the patient developed sudden onset of neurological changes that depressed mental status. She was found on diagnostic cerebral angiogram to have preocclusive visit artery stenosis. Dr. Estanislado Pandy discussed with the patient and son the risk for stroke and after taking written informed consent from the family she underwent emergent basilar artery stenting because of worsening symptoms and. However subsequent MRI scan showed extension of pontomedullary infarcts with new bilateral cerebellar infarcts without evidence of restenosis in the stented basilar artery. The patient required prolonged ventilatory support and subsequently tracheostomy and PEG tube. She was able to follow commands and move only the left arm and left leg minimally abdomen paralyzed on the right side. She had a prolonged stay in the ICU and was subsequently transferred to the floor. Critical care team followed her tracheostomy and gradually she was weaned off tracheostomy which was eventually discontinued on 11/11/18 and she did well and was breathing without difficulties to the date of that. She in fact passed a modified barium swallow and was cleared for dysphagia 2 diet on 12/06/2018. The patient hospital course was complicated by abnormal uterine bleeding which was felt to be related to enlarged fibroid uterus. She was seen by gynecology service and was started on Megace as well as Primaxin for that. She was found to have chronic anemia of iron deficiency.and she was found to have hypercalcimia with elevated parathyroid hormone and borderline vitamin D levels. She was treated with 2 doses of calcitonin injections and then medical hospitalist team was consulted for management.she was seen to be awake and interactive and speaking and comfortable 2 hours prior to her  death by me. When the nurse went to hang a bag of IV fluids and hour after she was last checked on she was found to be  unresponsive and pulseless. Code team was called. Despite running the code for adequate time. The patient did not show return of spontaneous circulation and she was pronounced dead by the code team. Family were at the bedside and I personally met with them and communicated the news and answered questions Signed: Antony Contras 11/14/2018, 5:38 PM

## 2018-12-06 NOTE — Code Documentation (Signed)
  Patient Name: Andrea [name]   MRN: [id number]   Date of Birth/ Sex: 04-Nov-1961 , female      Admission Date: 08/06/2018  Attending Provider: Garvin Fila, MD  Primary Diagnosis: Chest pain [R07.9] Basilar artery stenosis with infarction [facility]) [I63.22]   Indication: Pt was in her usual state of health until this PM, when she was noted to be in asystole. Code blue was subsequently called. At the time of arrival on scene, ACLS protocol was underway.   Technical Description:  - CPR performance duration:  25 minutes  - Was defibrillation or cardioversion used? No   - Was external pacer placed? No  - Was patient intubated pre/post CPR? Yes   Medications Administered: Y = Yes; Blank = No Amiodarone    Atropine    Calcium    Epinephrine  7  Lidocaine    Magnesium    Norepinephrine    Phenylephrine    Sodium bicarbonate  3  Vasopressin    Other    Post CPR evaluation:  - Final Status - Was patient successfully resuscitated ? No   Miscellaneous Information:  - Time of death:  438 PM  - Primary team notified?  Yes  - Family Notified? Yes     Welford Roche, MD   11-22-18, 5:58 PM

## 2018-12-06 NOTE — Progress Notes (Signed)
Walked into room to hang bolus of fluid.  Spoke to patient.  No response.  Tried to wake her. Called code at 61. Compressions started by RN.  RN, student and NT were just in room at 1603 taking vital signs and cleaning patient.  She was alert, oriented, talking, and made no complaints of pain or discomfort. VS were stable. O2 sat was 98% on room air.

## 2018-12-06 NOTE — Progress Notes (Signed)
Contacted oncall for triad Dr. Silas [place], he will fill out death certificate and walk it to the appropriate location.

## 2018-12-06 NOTE — Progress Notes (Signed)
STROKE TEAM PROGRESS NOTE   SUBJECTIVE (INTERVAL HISTORY) Pt is sitting up in bed today . 2 family members at bedside She has passed modified barium swallow and cleared for dysphagia 2 diet now...  Her serum calcium is yet elevated at 12.2.serum PTH  at 85 and vitamin D levels are borderline at 29.9 OBJECTIVE Vitals:   11/23/18 0532 2018-11-23 0821 November 23, 2018 1150 11-23-18 1306  BP: (!) 154/87 (!) 152/75 122/69 134/70  Pulse: 74 60 71   Resp: 18 18 18    Temp: 98.6 F (37 C) 97.6 F (36.4 C)    TempSrc: Oral Oral Oral   SpO2: 95% 97% 97%   Weight:      Height:        CBC:  Recent Labs  Lab 11/07/18 0547 11/11/18 0611  WBC 9.0 10.5  HGB 10.5* 11.1*  HCT 34.1* 36.3  MCV 86.1 85.0  PLT 414* 696    Basic Metabolic Panel:  Recent Labs  Lab 11/11/18 0611 11/12/18 0457  NA 141 140  K 3.6 3.3*  CL 108 110  CO2 22 22  GLUCOSE 121* 102*  BUN 39* 46*  CREATININE 1.21* 1.24*  CALCIUM 14.1* 12.4*  12.2*    IMAGING Dg Swallowing Func-speech Pathology  Result Date: 11-23-2018 Objective Swallowing Evaluation: Type of Study: MBS-Modified Barium Swallow Study  Patient Details Name: TAMILA GAULIN MRN: [id number] Date of Birth: 26-Jul-1961 Today's Date: 11-23-18 Time: SLP Start Time (ACUTE ONLY): 1100 -SLP Stop Time (ACUTE ONLY): 1130 SLP Time Calculation (min) (ACUTE ONLY): 30 min Past Medical History: Past Medical History: Diagnosis Date . Diabetes mellitus  . Environmental allergies  . Fibroid  . High cholesterol  . Hx of cardiovascular stress test   a. ETT-MV 2/14:  EF 46% (normal by echo), low risk, no ischemia or scar . Hx of echocardiogram   Echo 2/14: mild LVH, EF 55-60%, Gr 1 diast dysfn, mild LAE . Hypertension  . Hyperthyroidism   Radioactive iodine ablation Past Surgical History: Past Surgical History: Procedure Laterality Date . [place], 2001  x 2 . ESOPHAGOGASTRODUODENOSCOPY N/A 08/21/2018  Procedure: ESOPHAGOGASTRODUODENOSCOPY (EGD);  Surgeon: Judeth Horn, MD;   Location: [facility];  Service: General;  Laterality: N/A;  bedside . GASTROSTOMY N/A 08/25/2018  Procedure: INSERTION OF GASTROSTOMY TUBE;  Surgeon: Judeth Horn, MD;  Location: [facility];  Service: General;  Laterality: N/A; . IR ANGIO INTRA EXTRACRAN SEL COM CAROTID INNOMINATE BILAT MOD SED  08/17/2018 . IR ANGIO VERTEBRAL SEL SUBCLAVIAN INNOMINATE UNI R MOD SED  08/30/2018 . IR CT HEAD LTD  08/29/2018 . IR INTRA CRAN STENT  08/17/2018 . LAPAROTOMY N/A 08/23/2018  Procedure: EXPLORATORY LAPAROTOMY;  Surgeon: Judeth Horn, MD;  Location: [facility];  Service: General;  Laterality: N/A; . LAPAROTOMY N/A 09/14/2018  Procedure: EXPLORATORY LAPAROTOMY;  Surgeon: Judeth Horn, MD;  Location: [facility];  Service: General;  Laterality: N/A; . PEG PLACEMENT N/A 08/30/2018  Procedure: PERCUTANEOUS ENDOSCOPIC GASTROSTOMY (PEG) PLACEMENT;  Surgeon: Judeth Horn, MD;  Location: [facility];  Service: General;  Laterality: N/A; . RADIOLOGY WITH ANESTHESIA N/A 08/15/2018  Procedure: IR WITH ANESTHESIA;  Surgeon: Luanne Bras, MD;  Location: [facility];  Service: Radiology;  Laterality: N/A; HPI: 58 yo presented to ED with chest pain noted Rt hemiparesis in ED with acute Left paramedian brainstem infarct. Intubated 10/10 for angioplasty, extubated post procedure and reintubated. Repeat MRI with brainstem infarct extension and bil cerebellar infarcts. Peg/trach 08/14/2018. FEES 09/15/18, 09/24/18, 12/9.  Dysphagia 1 diet with nectar-thick liquids  initiated after last FEES.  PMHx: HTN, DM, CKD  Subjective: alert, participatory and communicative Assessment / Plan / Recommendation CHL IP CLINICAL IMPRESSIONS Nov 26, 2018 Clinical Impression Pt presents with a primary oral dysphagia, secondary pharyngeal dysphagia marked by impaired bolus cohesion and control, leading to premature spillage of liquids, particularly thins, which entered the open larynx and were immediately aspirated.  Thickening the liquids to nectar allowed more time for pt to  achieve laryngeal vestibule closure before the liquids entered the hypopharynx; therefore, there was no aspiration, and only occasional penetration (PAS score of 2 and 5).  Purees required greater time and effort for pt to propel posteriorly; they cleared into through the UES with no residue post-swallow.  Recommend that pt remain on a dysphagia 2 diet with nectar thick liquids.  Treatment should focus on improving oral control and timing/release of bolus into pharynx.  SLP will continue to follow acutely.  SLP Visit Diagnosis Dysphagia, oropharyngeal phase (R13.12) Attention and concentration deficit following -- Frontal lobe and executive function deficit following -- Impact on safety and function Mild aspiration risk   CHL IP TREATMENT RECOMMENDATION November 26, 2018 Treatment Recommendations Therapy as outlined in treatment plan below   Prognosis 11-26-2018 Prognosis for Safe Diet Advancement Good Barriers to Reach Goals -- Barriers/Prognosis Comment -- [facility] IP DIET RECOMMENDATION 11-26-2018 SLP Diet Recommendations Dysphagia 2 (Fine chop) solids;Nectar thick liquid Liquid Administration via Straw Medication Administration Whole meds with puree Compensations -- Postural Changes Remain semi-upright after after feeds/meals (Comment);Seated upright at 90 degrees   CHL IP OTHER RECOMMENDATIONS 2018-11-26 Recommended Consults -- Oral Care Recommendations Oral care BID Other Recommendations --   CH[facility] IP FOLLOW UP RECOMMENDATIONS 2018/11/26 Follow up Recommendations Skilled Nursing facility   [facility] IP FREQUENCY AND DURATION 26-Nov-2018 Speech Therapy Frequency (ACUTE ONLY) min 2x/week Treatment Duration 2 weeks      CHL IP ORAL PHASE Nov 26, 2018 Oral Phase Impaired Oral - Pudding Teaspoon -- Oral - Pudding Cup -- Oral - Honey Teaspoon NT Oral - Honey Cup -- Oral - Nectar Teaspoon NT Oral - Nectar Cup -- Oral - Nectar Straw Premature spillage;Decreased bolus cohesion Oral - Thin Teaspoon Decreased bolus cohesion;Premature spillage Oral - Thin  Cup -- Oral - Thin Straw -- Oral - Puree Decreased bolus cohesion;Weak lingual manipulation;Delayed oral transit Oral - Mech Soft -- Oral - Regular NT Oral - Multi-Consistency -- Oral - Pill -- Oral Phase - Comment --  CH[facility] IP PHARYNGEAL PHASE 2018/11/26 Pharyngeal Phase Impaired Pharyngeal- Pudding Teaspoon -- Pharyngeal -- Pharyngeal- Pudding Cup -- Pharyngeal -- Pharyngeal- Honey Teaspoon -- Pharyngeal -- Pharyngeal- Honey Cup NT Pharyngeal -- Pharyngeal- Nectar Teaspoon -- Pharyngeal -- Pharyngeal- Nectar Cup NT Pharyngeal -- Pharyngeal- Nectar Straw Delayed swallow initiation-pyriform sinuses Pharyngeal Material enters airway, remains ABOVE vocal cords then ejected out;Material enters airway, CONTACTS cords and then ejected out Pharyngeal- Thin Teaspoon Delayed swallow initiation-pyriform sinuses;Penetration/Aspiration before swallow;Moderate aspiration Pharyngeal Material enters airway, passes BELOW cords and not ejected out despite cough attempt by patient Pharyngeal- Thin Cup -- Pharyngeal -- Pharyngeal- Thin Straw -- Pharyngeal -- Pharyngeal- Puree Delayed swallow initiation-vallecula Pharyngeal Material does not enter airway Pharyngeal- Mechanical Soft -- Pharyngeal -- Pharyngeal- Regular NT Pharyngeal -- Pharyngeal- Multi-consistency -- Pharyngeal -- Pharyngeal- Pill -- Pharyngeal -- Pharyngeal Comment --  No flowsheet data found. [name] 11/26/18, 1:54 PM                 PHYSICAL EXAM  General -  Obese middle-aged African-American lady, on trach now removed, not in  distress. Her abd is soft, nondistended and nontender. PEG tube in place. BS mild rhonchi but no crackles. Cardiovascular - regular rate and rhythm Neurological Exam - on trach decanulatedd, eyes open, awake, alert. No ptosis or EOMI, denies diplopia.  PERRL. Bilateral facial weakness, tongue protrusion weak. Able to wiggle left hand fingers and left foot toes. 2-/5 LLE proximal and 2/5 distally and 2/5 LUE.  Right-sided  hemiplegia- starting to have some mild muscular contractions with effort in R hand, not in R foot. Follows commands and good eye contact.  Speech is moderate dysarthric with speaking valve. No aphasia. Sensation intact to touch, cold. Gait not tested.    ASSESSMENT/PLAN Ms. KATE LAROCK is a 58 y.o. female with history of difficult to control hypertension, insulin-dependent diabetes, obesity, hypothyroidism status post ablation presenting with chest discomfort, right-sided weakness, slurred speech and right facial droop. She did not receive IV t-PA due to late presentation. S/P stent assisted angioplasty of distal basilar artery.  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 Worsening symptoms with extension of pontine/medullary infarcts with new small bilateral cerebellar infarcts without evidence of stent re-stenosis or occlusion  Remains stable  VTE prophylaxis - heparin subq  on aspirin 81 mg daily PTA. Now on Brilinta 90 mg bid and ASA 81 mg daily.   Therapy recommendations:  SNF  Disposition:  SNF  Medically ready for d/c  D/c paperwork completed 10/14/18  Abnormal uterine bleeding, stable  Has been following with GYN  Was on megace in the past  CT abd/pelvis 09/03/18 - enlarged fibroid uterus  continue megace 80mg  bid  Continue ASA and brilinta   OBGYN recs   Received primarin 25mg  IV once - vaginal bleeding much improved  Started to have mild vaginal bleeding again 11/06/18, H&H stable so far, continue megace but will consider IV primarin if necessary.   Anemia, resolved  Hb 6.8->PRBC->7.1->10.3->9.7->10.5  Likely due to iron deficiency, heavy periods and acute uterine bleeding and abdominal wall surgeries  Iron panel showed iron deficiency  On iron solutions  PRBC transfusion 1 U 09/10/2018 and 2U 09/07/18 and 2U 09/21/18   Intracranial stenosis  BA mid to distal severe stenosis s/p BA stenting  Left PCA  severe stenosis, R PCA moderate stenosis  Left MCA moderate stenosis  Uncontrolled stroke risk factors with HLD, HTN, DM  Non compliance with meds at home  Respiratory failure  S/p trach 08/06/2018  Able to speak with [name] valve although dysarthric  Trach Dc 11/11/18    Hypertension  On the high end - 150-170s  BP goal 130-160s  On Coreg 25 bid, amlodipine 10 and hydralazine 75 mg every 6 hours  Add lisinopril 20  Hyperlipidemia  Lipid lowering medication PTA:  Lipitor 20 mg daily  LDL 174, goal < 70  Now on Lipitor 80 mg daily  Continue statin at discharge  Diabetes  HgbA1c 11.4, goal < 7.0  Lantus 17u BID  Basal NovoLog to 3U 4 times a day with Vital AF feeding - this was d/c'd 12/26 at 2234 - continue to hold for now d/t normalized glucoses  SSI  QAC+QHS  CBG monitoring QAC+QHS  Hypernatremia -> hyponatremia-> hypernatremia->normal, resolved  Na 139  On vital AF 4 times a day  On free water 200cc qid  Dysphagia s/p PEG complicated by abdominal wall abscess, resolved  PEG done 08/17/2018  Removed and replaced dislodged PEG 08/05/2018  CT showed Abdominal wall abscess - s/p open  surgical drainage 08/08/2018 persistent foul odor discharge. Reexploration 09/26/2018  Wound debrided appropriately and sutures removed.  No evisceration  12/24 - new abdominal pain during the night. Seen by trauma. CT abd/pelvis unremarkable.  No surgical, acute issues. They signed off.  On supplement 3x day  Diet increased from dysphagia 1 to Dysphagia 2 diet with nectar thick liquids  Atypical chest pain, resolved  sternal chest pain 10/19/18 after bouts of coughing.   Tele no changes.   Troponin < 0.03.   CXR negative.   Electrolytes WNL  Chest pain since resolved.   Other Stroke Risk Factors  Morbid Obesity, Body mass index is 45.41 kg/m., recommend weight loss, diet and exercise as appropriate   Other Active Problems  Thrombocytosis, resolved -  likely due to anemia, 385  Oral thrush, resolved on nystatin. Continue frequent mouth care  Left arm pain. B shoulder pain. Pain mgt, k pad. No inc in WBC, TM 99.4. [place] with positive superficial vein thrombosis in the L Cephalic vein. Treat with ASA. AC not indication. Continue. Muscle rub as added to pain regimen  Contact precautions for ESBL  Hypercalcemia-etiology unclear- repeat calcitonin injection. Check PTH and vitamin D levels  Hospital day # [facility] for elevated parathyroid and calcium. Start dysphagia 2 diet.  Hopefully transfer to skilled nursing facility for rehabilitation over the next few days Greater than 50% and during this 25 minute visit was spent on coordination of care and discussion with care team. Antony Contras, MD Stroke Neurology 12-11-2018 2:02 PM

## 2018-12-06 NOTE — Social Work (Signed)
CSW has resent referral to [facility] through secure hub per admissions director, Levada Dy request. CSW continuing to follow for support with disposition when medically appropriate.  Westley Hummer, MSW, [place] [facility] [phone]

## 2018-12-06 NOTE — Progress Notes (Signed)
Modified Barium Swallow Progress Note  Patient Details  Name: Andrea [name] MRN: [id number] Date of Birth: [birth year]  Today's Date: 11/20/2018  Modified Barium Swallow completed.  Full report located under Chart Review in the Imaging Section.  Brief recommendations include the following:  Clinical Impression  Pt presents with a primary oral dysphagia, secondary pharyngeal dysphagia marked by impaired bolus cohesion and control, leading to premature spillage of liquids, particularly thins, which entered the open larynx and were immediately aspirated.  Thickening the liquids to nectar allowed more time for pt to achieve laryngeal vestibule closure before the liquids entered the hypopharynx; therefore, there was no aspiration, and only occasional penetration (PAS score of 2 and 5).  Purees required greater time and effort for pt to propel posteriorly; they cleared into through the UES with no residue post-swallow.  Recommend that pt remain on a dysphagia 2 diet with nectar thick liquids.  Treatment should focus on improving oral control and timing/release of bolus into pharynx.  SLP will continue to follow acutely.    Swallow Evaluation Recommendations       SLP Diet Recommendations: Dysphagia 2 (Fine chop) solids;Nectar thick liquid   Liquid Administration via: Straw   Medication Administration: Whole meds with puree   Supervision: Full assist for feeding       Postural Changes: Remain semi-upright after after feeds/meals (Comment);Seated upright at 90 degrees   Oral Care Recommendations: Oral care BID      Andrea [name], [place] Office number [phone] Pager [phone]   Andrea [name] 11/20/2018,1:56 PM

## 2018-12-06 NOTE — Progress Notes (Signed)
   2018-11-23 1900  Clinical Encounter Type  Visited With Family;Patient not available  Visit Type Death  Referral From Nurse  Spiritual Encounters  Spiritual Needs Emotional;Grief support  [place] paged to offer support to family after patient death; [place] met with family at bedside and family indicated they will contact [place] if additional support needed.

## 2018-12-06 NOTE — Consult Note (Addendum)
Medical Consultation   Andrea [name]  VVO:[id number]  DOB: [birth year]  DOA: 08/07/2018  PCP: Danna Hefty, DO    Requesting physician: Dr. Leonie Man  Reason for consultation: Hypercalcemia   History of Present Illness: Andrea [name] is an 58 y.o. 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  [name] was discontinued 2 days ago and she passed her barium swallow today.  Plans were being made to discharge patient to SNF; however, calcium was noted to be gradually increasing over the last several days.  She has had borderline hypercalcemia since admission, with fluctuating values between 9.5 and 12 over the last 3 months. Chart review shows intermittent mild hypercalcemia dating back to 2011, with most high values in the 10s to low 11s.  It rose to 14.1 on 1/7.  It is 12.4 today. PTH was checked yesterday and was elevated at 85.  Vitamin D was borderline low at 29.9. She is hypoalbuminemic. Primary team contacted endocrinology via telephone who suggested calcitonin.  She received 2 doses of calcitonin 400 units IV on 1/7, and one dose of 400 units IV yesterday. Also upon chart review, appears her creatinine has doubled from ~0.6 throughout her stay, to 1.21 and 1.24 yesterday and today.  Patient at this time is asymptomatic other than pain in her arms and legs. She is generally weak overall but has been in the hospital for 3 months. She is alert and oriented.    Review of Systems:  ROS As per HPI otherwise 10 point review of  systems negative.    Past Medical History: Past Medical History:  Diagnosis Date  . Diabetes mellitus   . Environmental allergies   . Fibroid   . High cholesterol   . Hx of cardiovascular stress test    a. ETT-MV 2/14:  EF 46% (normal by echo), low risk, no ischemia or scar  . Hx of echocardiogram    Echo 2/14: mild LVH, EF 55-60%, Gr 1 diast dysfn, mild LAE  . Hypertension   . Hyperthyroidism    Radioactive iodine ablation    Past Surgical History: Past Surgical History:  Procedure Laterality Date  . [place], 2001   x 2  . ESOPHAGOGASTRODUODENOSCOPY N/A 08/17/2018   Procedure: ESOPHAGOGASTRODUODENOSCOPY (EGD);  Surgeon: Judeth Horn, MD;  Location: [facility];  Service: General;  Laterality: N/A;  bedside  . GASTROSTOMY N/A 08/29/2018   Procedure: INSERTION OF GASTROSTOMY TUBE;  Surgeon: Judeth Horn, MD;  Location: [facility];  Service: General;  Laterality: N/A;  . IR ANGIO INTRA EXTRACRAN SEL COM CAROTID INNOMINATE BILAT MOD SED  08/21/2018  . IR ANGIO VERTEBRAL SEL SUBCLAVIAN INNOMINATE UNI R MOD SED  08/19/2018  . IR CT HEAD LTD  08/24/2018  . IR INTRA CRAN STENT  09/02/2018  . LAPAROTOMY N/A 08/14/2018   Procedure: EXPLORATORY LAPAROTOMY;  Surgeon: Judeth Horn, MD;  Location: [facility];  Service: General;  Laterality: N/A;  . LAPAROTOMY N/A 09/30/2018   Procedure: EXPLORATORY LAPAROTOMY;  Surgeon: Judeth Horn, MD;  Location: [facility];  Service: General;  Laterality: N/A;  . PEG PLACEMENT N/A 08/09/2018   Procedure: PERCUTANEOUS ENDOSCOPIC GASTROSTOMY (PEG) PLACEMENT;  Surgeon: Judeth Horn, MD;  Location: [facility];  Service: General;  Laterality: N/A;  . RADIOLOGY WITH ANESTHESIA N/A 08/21/2018   Procedure: IR WITH ANESTHESIA;  Surgeon: Luanne Bras, MD;  Location: [facility];  Service: Radiology;  Laterality: N/A;     Allergies:   Allergies  Allergen Reactions  . Tramadol     "Makes me sees things and I just dont act right"     Social History:   reports that she has never smoked. She has never used smokeless tobacco. She reports that she does not drink alcohol or use drugs.   Family History: Family History  Problem Relation Age of Onset  . Pneumonia Mother   . Diabetes Mother   . Hypertension Father   . Hypertension Sister   . Hyperlipidemia Sister   . Hypertension Brother   . Hypertension Maternal Aunt   . Hypertension Paternal Aunt   . Cancer Paternal Aunt        throat cancer  . Diabetes Maternal Grandmother   . Diabetes Paternal Grandmother   . Cancer Paternal Grandmother        pancreatic cancer      Physical Exam: Vitals:   12-11-18 0532 2018-12-11 0821 11-Dec-2018 1150 11-Dec-2018 1306  BP: (!) 154/87 (!) 152/75 122/69 134/70  Pulse: 74 60 71   Resp: 18 18 18    Temp: 98.6 F (37 C) 97.6 F (36.4 C)    TempSrc: Oral Oral Oral   SpO2: 95% 97% 97%   Weight:      Height:        Constitutional: Obese AA female, alert and awake, oriented x3, not in any acute distress. Eyes: PERLA, EOMI, irises appear normal, anicteric sclera ENMT: external ears and nose appear normal, normal hearing, Lips appear dry, oropharynx mucosa, tongue, posterior pharynx appear normal  Neck: decannulated trach; neck appears normal, no masses, normal ROM, no thyromegaly, no JVD  CVS: S1-S2 clear, no murmur rubs or gallops, no LE edema, normal pedal pulses  Respiratory:  Normal effort. Some scattered rhonchi bilaterally; otherwise clear to auscultation, no wheezing, rales.  Abdomen: PEG tube in place, site is C/D/I; abd soft nontender, nondistended, normal bowel sounds, no hepatosplenomegaly, no hernias  Musculoskeletal: no cyanosis, clubbing or edema noted bilaterally Neuro: Moderate dysarthria with PMV; alert and oriented; follows commands. R sided hemiplegia; weakness in LUE and LLE.  Psych: judgement and insight appear normal, stable mood and affect Skin: no rashes or lesions or ulcers, no induration or nodules    Data reviewed:  I have  personally reviewed the recent labs and imaging studies  Pertinent Labs:  CBC:      Recent Labs  Lab 11/07/18 0547 11/11/18 0611  WBC 9.0 10.5  HGB 10.5* 11.1*  HCT 34.1* 36.3  MCV 86.1 85.0  PLT 414* 956    Basic Metabolic Panel:      Recent Labs  Lab 11/11/18 0611 11/12/18 0457  NA 141 140  K 3.6 3.3*  CL 108 110  CO2 22 22  GLUCOSE 121* 102*  BUN 39* 46*  CREATININE 1.21* 1.24*  CALCIUM 14.1* 12.4*  12.2*   Albumin 2.3 Creat ~0.6 --> ~1.25 over last 2 days   Inpatient Medications:   Scheduled Meds: . amLODipine  10 mg Per Tube Daily  . aspirin  81 mg Per Tube Daily  . atorvastatin  80 mg Per Tube q1800  .  baclofen  5 mg Per Tube BID  . carvedilol  25 mg Per Tube BID WC  . chlorhexidine  15 mL Mouth Rinse BID  . escitalopram  10 mg Per Tube Daily  . feeding supplement (PRO-STAT SUGAR FREE 64)  30 mL Per Tube TID  . ferrous sulfate  300 mg Per Tube TID WC  . free water  200 mL Per Tube QID  . gabapentin  100 mg Per Tube Q8H  . heparin injection (subcutaneous)  5,000 Units Subcutaneous Q8H  . hydrALAZINE  75 mg Per Tube Q6H  . insulin aspart  0-20 Units Subcutaneous TID WC  . insulin glargine  17 Units Subcutaneous BID  . lisinopril  20 mg Oral Daily  . liver oil-zinc oxide   Topical QID  . LORazepam  0.25 mg Per Tube QHS  . mouth rinse  15 mL Mouth Rinse q12n4p  . megestrol  80 mg Per Tube BID  . multivitamin  15 mL Per Tube Daily  . pantoprazole sodium  40 mg Per Tube Daily  . thiamine  100 mg Per Tube Daily  . ticagrelor  90 mg Per Tube BID   Continuous Infusions: . sodium chloride 75 mL/hr at 11/18/2018 1000     Radiological Exams on Admission: Dg Swallowing Func-speech Pathology  Result Date: Nov 18, 2018 Objective Swallowing Evaluation: Type of Study: MBS-Modified Barium Swallow Study  Patient Details Name: Andrea [name] MRN: [id number] Date of Birth: Jul 28, 1961 Today's Date: 11/18/18 Time: SLP Start Time (ACUTE ONLY): 1100 -SLP Stop Time  (ACUTE ONLY): 1130 SLP Time Calculation (min) (ACUTE ONLY): 30 min Past Medical History: Past Medical History: Diagnosis Date . Diabetes mellitus  . Environmental allergies  . Fibroid  . High cholesterol  . Hx of cardiovascular stress test   a. ETT-MV 2/14:  EF 46% (normal by echo), low risk, no ischemia or scar . Hx of echocardiogram   Echo 2/14: mild LVH, EF 55-60%, Gr 1 diast dysfn, mild LAE . Hypertension  . Hyperthyroidism   Radioactive iodine ablation Past Surgical History: Past Surgical History: Procedure Laterality Date . [place], 2001  x 2 . ESOPHAGOGASTRODUODENOSCOPY N/A 08/11/2018  Procedure: ESOPHAGOGASTRODUODENOSCOPY (EGD);  Surgeon: Judeth Horn, MD;  Location: [facility];  Service: General;  Laterality: N/A;  bedside . GASTROSTOMY N/A 08/13/2018  Procedure: INSERTION OF GASTROSTOMY TUBE;  Surgeon: Judeth Horn, MD;  Location: [facility];  Service: General;  Laterality: N/A; . IR ANGIO INTRA EXTRACRAN SEL COM CAROTID INNOMINATE BILAT MOD SED  08/24/2018 . IR ANGIO VERTEBRAL SEL SUBCLAVIAN INNOMINATE UNI R MOD SED  08/28/2018 . IR CT HEAD LTD  08/13/2018 . IR INTRA CRAN STENT  08/30/2018 . LAPAROTOMY N/A 09/02/2018  Procedure: EXPLORATORY LAPAROTOMY;  Surgeon: Judeth Horn, MD;  Location: [facility];  Service: General;  Laterality: N/A; . LAPAROTOMY N/A 09/26/2018  Procedure: EXPLORATORY LAPAROTOMY;  Surgeon: Judeth Horn, MD;  Location: [facility];  Service: General;  Laterality: N/A; . PEG PLACEMENT N/A 08/30/2018  Procedure: PERCUTANEOUS ENDOSCOPIC GASTROSTOMY (PEG) PLACEMENT;  Surgeon: Judeth Horn, MD;  Location: [facility];  Service: General;  Laterality: N/A; . RADIOLOGY WITH ANESTHESIA N/A 08/10/2018  Procedure: IR WITH ANESTHESIA;  Surgeon: Luanne Bras, MD;  Location: [facility];  Service: Radiology;  Laterality: N/A; HPI: 58 yo presented to ED with chest pain noted Rt hemiparesis in ED with acute Left paramedian brainstem infarct. Intubated 10/10 for angioplasty, extubated post procedure  and reintubated. Repeat MRI with brainstem infarct extension and bil cerebellar infarcts. Peg/trach 08/11/2018. FEES  09/15/18, 09/24/18, 12/9.  Dysphagia 1 diet with nectar-thick liquids initiated after last FEES.  PMHx: HTN, DM, CKD  Subjective: alert, participatory and communicative Assessment / Plan / Recommendation CHL IP CLINICAL IMPRESSIONS 11/28/2018 Clinical Impression Pt presents with a primary oral dysphagia, secondary pharyngeal dysphagia marked by impaired bolus cohesion and control, leading to premature spillage of liquids, particularly thins, which entered the open larynx and were immediately aspirated.  Thickening the liquids to nectar allowed more time for pt to achieve laryngeal vestibule closure before the liquids entered the hypopharynx; therefore, there was no aspiration, and only occasional penetration (PAS score of 2 and 5).  Purees required greater time and effort for pt to propel posteriorly; they cleared into through the UES with no residue post-swallow.  Recommend that pt remain on a dysphagia 2 diet with nectar thick liquids.  Treatment should focus on improving oral control and timing/release of bolus into pharynx.  SLP will continue to follow acutely.  SLP Visit Diagnosis Dysphagia, oropharyngeal phase (R13.12) Attention and concentration deficit following -- Frontal lobe and executive function deficit following -- Impact on safety and function Mild aspiration risk   CHL IP TREATMENT RECOMMENDATION Nov 28, 2018 Treatment Recommendations Therapy as outlined in treatment plan below   Prognosis 11/28/18 Prognosis for Safe Diet Advancement Good Barriers to Reach Goals -- Barriers/Prognosis Comment -- [facility] IP DIET RECOMMENDATION 2018-11-28 SLP Diet Recommendations Dysphagia 2 (Fine chop) solids;Nectar thick liquid Liquid Administration via Straw Medication Administration Whole meds with puree Compensations -- Postural Changes Remain semi-upright after after feeds/meals (Comment);Seated upright at 90  degrees   CHL IP OTHER RECOMMENDATIONS 11/28/18 Recommended Consults -- Oral Care Recommendations Oral care BID Other Recommendations --   CH[facility] IP FOLLOW UP RECOMMENDATIONS 11-28-18 Follow up Recommendations Skilled Nursing facility   [facility] IP FREQUENCY AND DURATION 11-28-18 Speech Therapy Frequency (ACUTE ONLY) min 2x/week Treatment Duration 2 weeks      CHL IP ORAL PHASE 2018/11/28 Oral Phase Impaired Oral - Pudding Teaspoon -- Oral - Pudding Cup -- Oral - Honey Teaspoon NT Oral - Honey Cup -- Oral - Nectar Teaspoon NT Oral - Nectar Cup -- Oral - Nectar Straw Premature spillage;Decreased bolus cohesion Oral - Thin Teaspoon Decreased bolus cohesion;Premature spillage Oral - Thin Cup -- Oral - Thin Straw -- Oral - Puree Decreased bolus cohesion;Weak lingual manipulation;Delayed oral transit Oral - Mech Soft -- Oral - Regular NT Oral - Multi-Consistency -- Oral - Pill -- Oral Phase - Comment --  CHL IP PHARYNGEAL PHASE 28-Nov-2018 Pharyngeal Phase Impaired Pharyngeal- Pudding Teaspoon -- Pharyngeal -- Pharyngeal- Pudding Cup -- Pharyngeal -- Pharyngeal- Honey Teaspoon -- Pharyngeal -- Pharyngeal- Honey Cup NT Pharyngeal -- Pharyngeal- Nectar Teaspoon -- Pharyngeal -- Pharyngeal- Nectar Cup NT Pharyngeal -- Pharyngeal- Nectar Straw Delayed swallow initiation-pyriform sinuses Pharyngeal Material enters airway, remains ABOVE vocal cords then ejected out;Material enters airway, CONTACTS cords and then ejected out Pharyngeal- Thin Teaspoon Delayed swallow initiation-pyriform sinuses;Penetration/Aspiration before swallow;Moderate aspiration Pharyngeal Material enters airway, passes BELOW cords and not ejected out despite cough attempt by patient Pharyngeal- Thin Cup -- Pharyngeal -- Pharyngeal- Thin Straw -- Pharyngeal -- Pharyngeal- Puree Delayed swallow initiation-vallecula Pharyngeal Material does not enter airway Pharyngeal- Mechanical Soft -- Pharyngeal -- Pharyngeal- Regular NT Pharyngeal -- Pharyngeal- Multi-consistency --  Pharyngeal -- Pharyngeal- Pill -- Pharyngeal -- Pharyngeal Comment --  No flowsheet data found. [name] 11/28/18, 1:54 PM               Impression/Recommendations Principal Problem:   Acute ischemic stroke (  [place]) L paramed pontine d/t BA stenosis, B cerebellar infarcts s/p mechanical thrombectomy Active Problems:   Insulin-requiring or dependent type II diabetes mellitus (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 Her hypercalcemia is more profound than appears, as her albumin is extremely low. She appears asymptomatic, although several symptoms of hypercalcemia are nonspecific (ie weakness, fatigue, constipation) which would also be expected in a patient who has had a prolonged hospitalization and has been critically ill. From a renal standpoint, she has developed AKI, which could be due to hyperCa, but is more likely due to several other things such as hypovolemia, drugs, etc. On the other hand, the acute component (ie severe hyperCa that she developed over the last 2 days) is also likely DUE in part to new AKI, which in turn is likely due to volume depletion and newly started ACE inhibitor. In  summary, her primary hyperparathyroidism appears to be chronic (predating her admission) and has caused a low level hypercalcemia for years; the more acute hypercalcemia is likely due to AKI.   -Pamidronate 90 mg IV x 1 to maintain control of hypercalcemia for 4-6 weeks by which time she should be seen by an endocrinologist. -aggressive IVF repletion; I have ordered 1L NS bolus, and increased maintenance IVF to 150 cc/hour. As long as she tolerates the fluids from a cardiorespiratory standpoint, would continue to aggressively fluid resuscitate. -have ordered ionized Ca, 24h urine Ca, phos -BMP tomorrow; monitor Ca, BUN/creat  AKI: Likely combination of prerenal etiology (hypovolemia) due to volume shifts / hypoperfusion during her prolonged hospitalization and various procedures/ interventions as well as concomitant ACE inhibitor therapy. This is likely worsening her hypercalcemia. -Will bolus 1L NS and increase maintenance rate to 150 cc/h -monitor BUN/creatinine closely  -avoid nephrotoxins, dose all meds per GFR -have stopped lisinopril; can be restarted if/when renal function normalizes (noted this was just started a few days ago, so is likely cause of acute drop in GFR); if needed for HTN can consider adding isosorbide mononitrate or clonidine. Avoid ACE-I, avoid diuretics -consider stopping PPI if not needed as this can cause AKI; however I think it's doubtful that this is contributing. -will check U/A -no evidence of obstruction but certainly in the differential (ie kidney stones from hyperCa); would get renal u/s if GFR does not improve with IVF -hypercalcemia management as above  Preventative measures for primary hyperparathyroidism: Encourage physical activity; minimize immobility Avoid volume depletion, encourage adequate hydration. Maintain moderate calcium intake (1000 mg/day) to avoid further increases in PTH secretion Maintain moderate Vit D intake (400-800 IU/day) to maintain  25-OH vit D levels.  Consider nutrition consult to aid in meeting the above goals.    Thank you for this  consultation.  Our [facility] hospitalist team will follow the patient with you.   Time Spent: 14 min  Janora Norlander M.D. [facility] 12-11-18, 2:58 PM

## 2018-12-06 NOTE — Progress Notes (Signed)
Pt has completed swallow test in fluoroscopy.  Family at bedside washing hair.  Pt pleasant with no complaints. VSS.

## 2018-12-06 DEATH — deceased

## 2020-03-25 IMAGING — CR DG CHEST 2V
2 series · 2 of 2 positions shown · non-contrast
Comparison: 12/16/2017

CLINICAL DATA: Chest pain starting yesterday.

EXAM:
CHEST - 2 VIEW

[w chest pa]
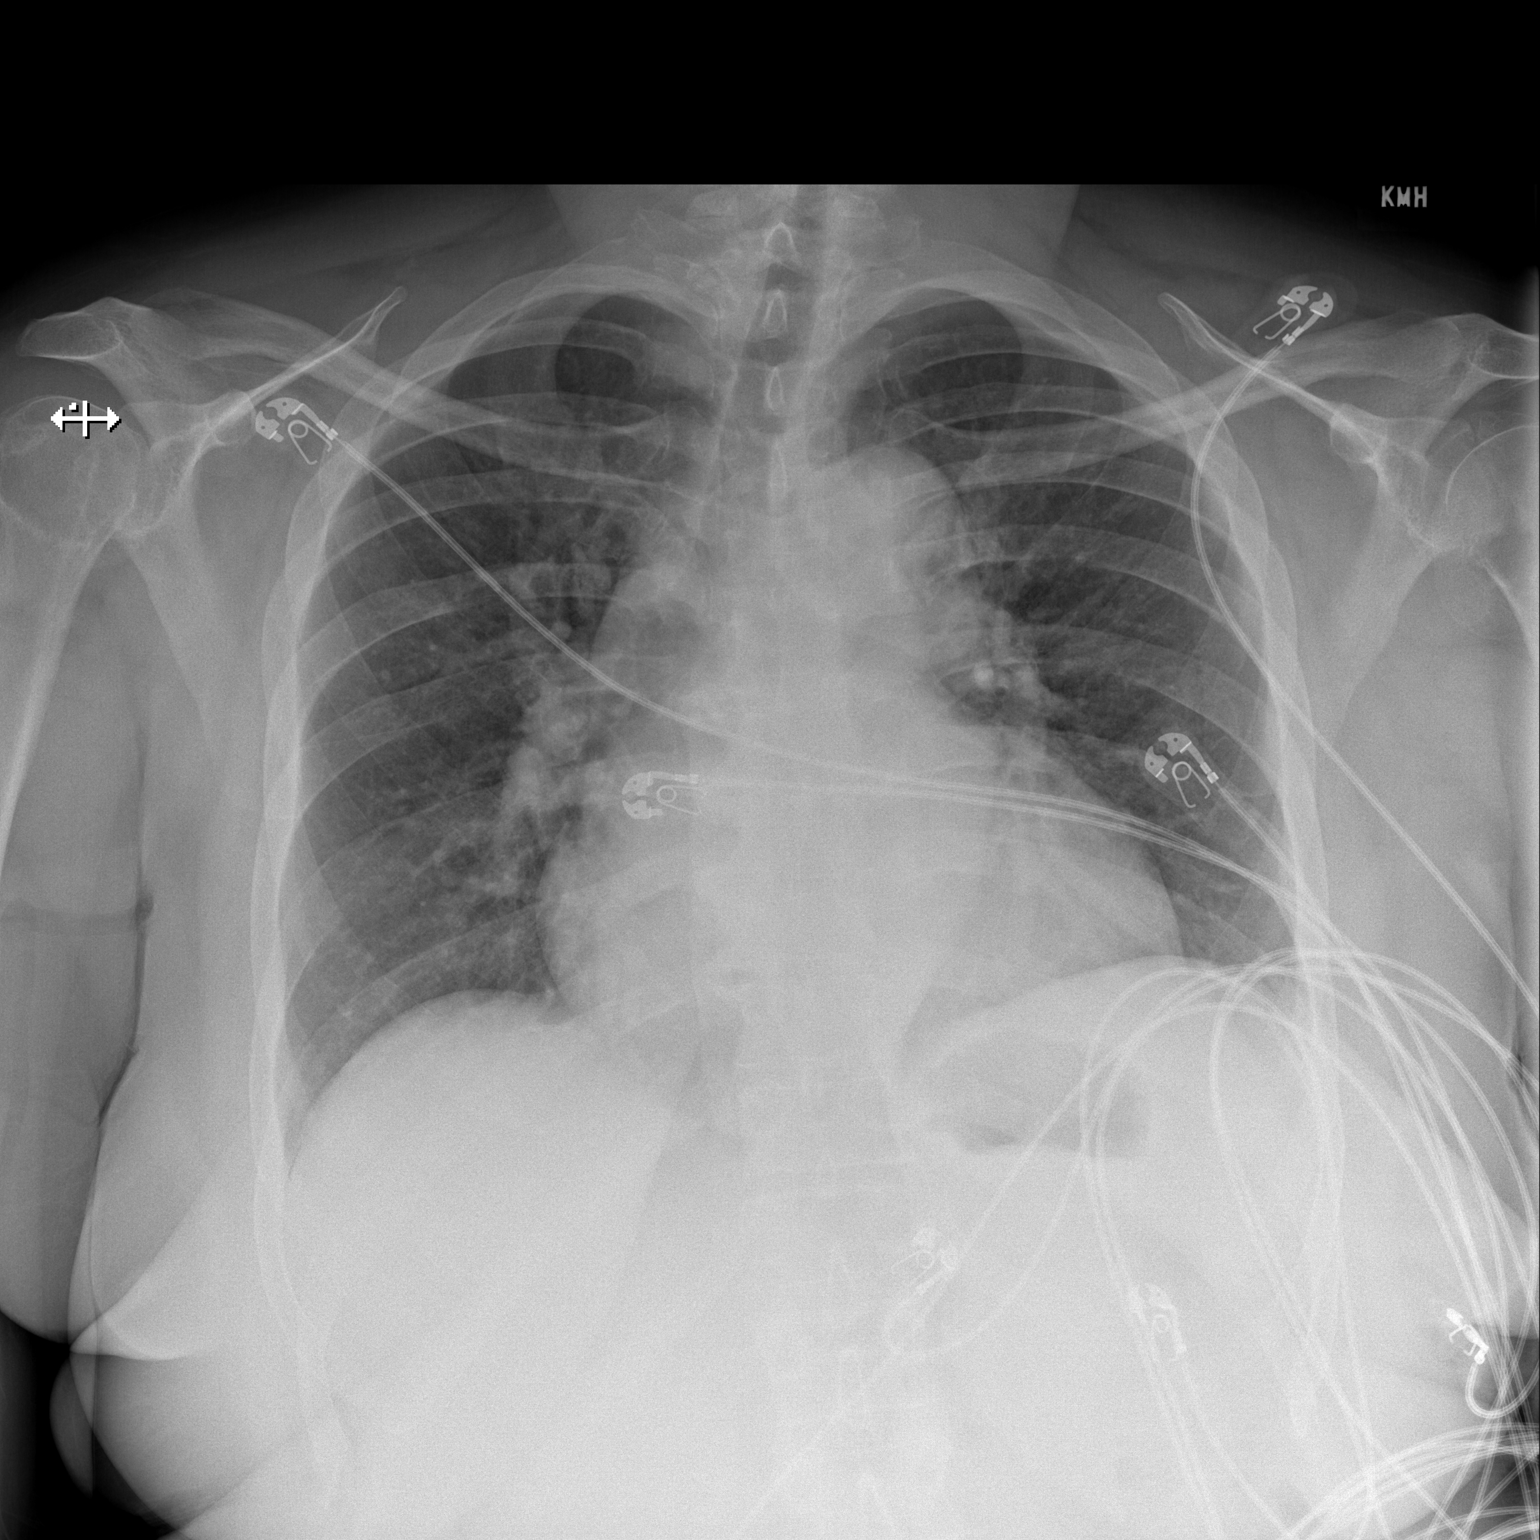

[w chest lat]
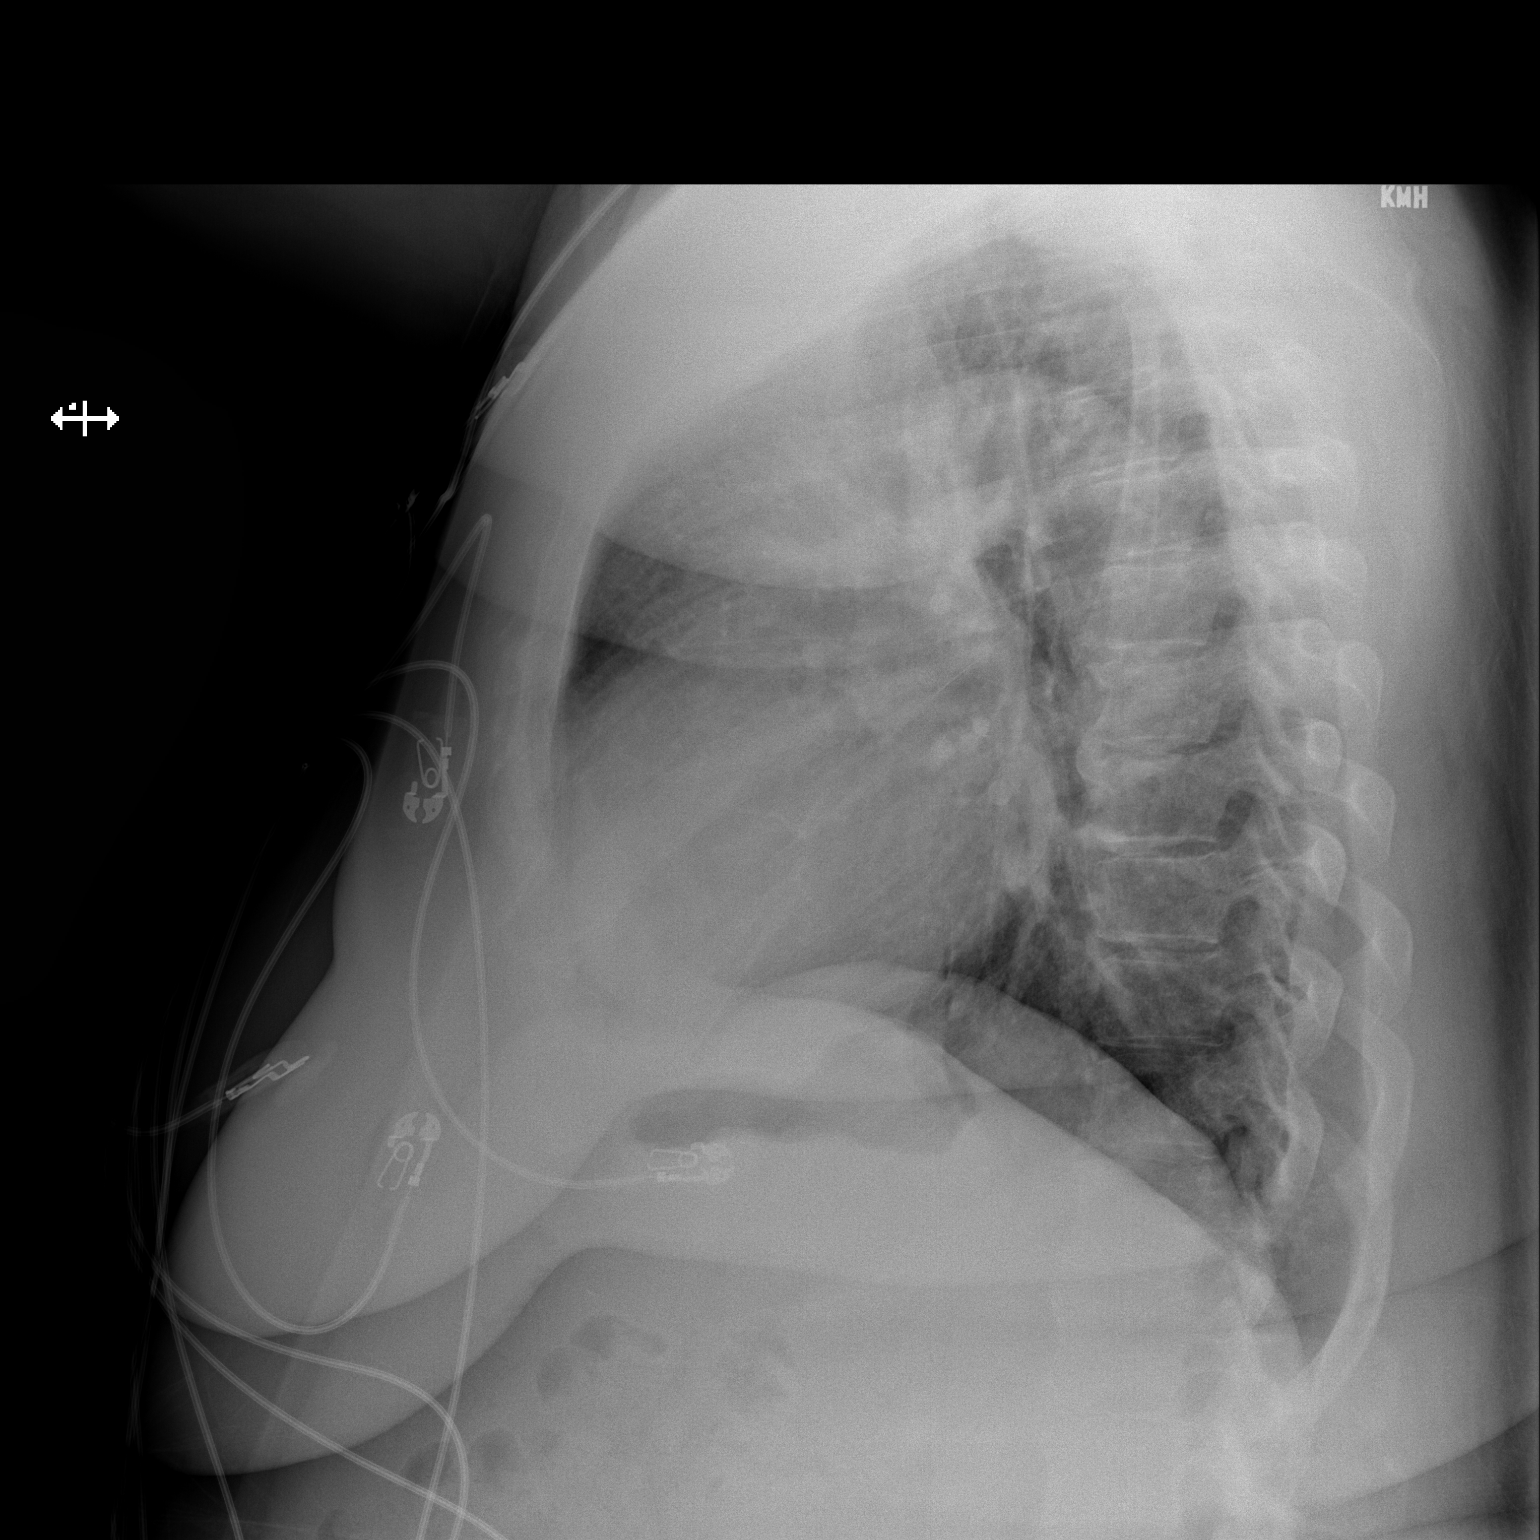

[2 of 2 positions shown; findings below may reference images not displayed]

FINDINGS: Stable cardiomegaly with atherosclerotic, slightly ectatic thoracic
aorta. Central pulmonary vascular congestion is noted without
pulmonary consolidation. No effusion or pneumothorax. Thoracic
spondylosis is noted. No acute osseous abnormality is seen.
IMPRESSION: Cardiomegaly with mild central vascular congestion.

## 2020-03-26 IMAGING — DX DG CHEST 1V PORT
1 series · 1 of 1 positions shown · non-contrast
Comparison: 08/14/2018

CLINICAL DATA: Encounter for intubation

EXAM:
PORTABLE CHEST 1 VIEW

[chest ap]
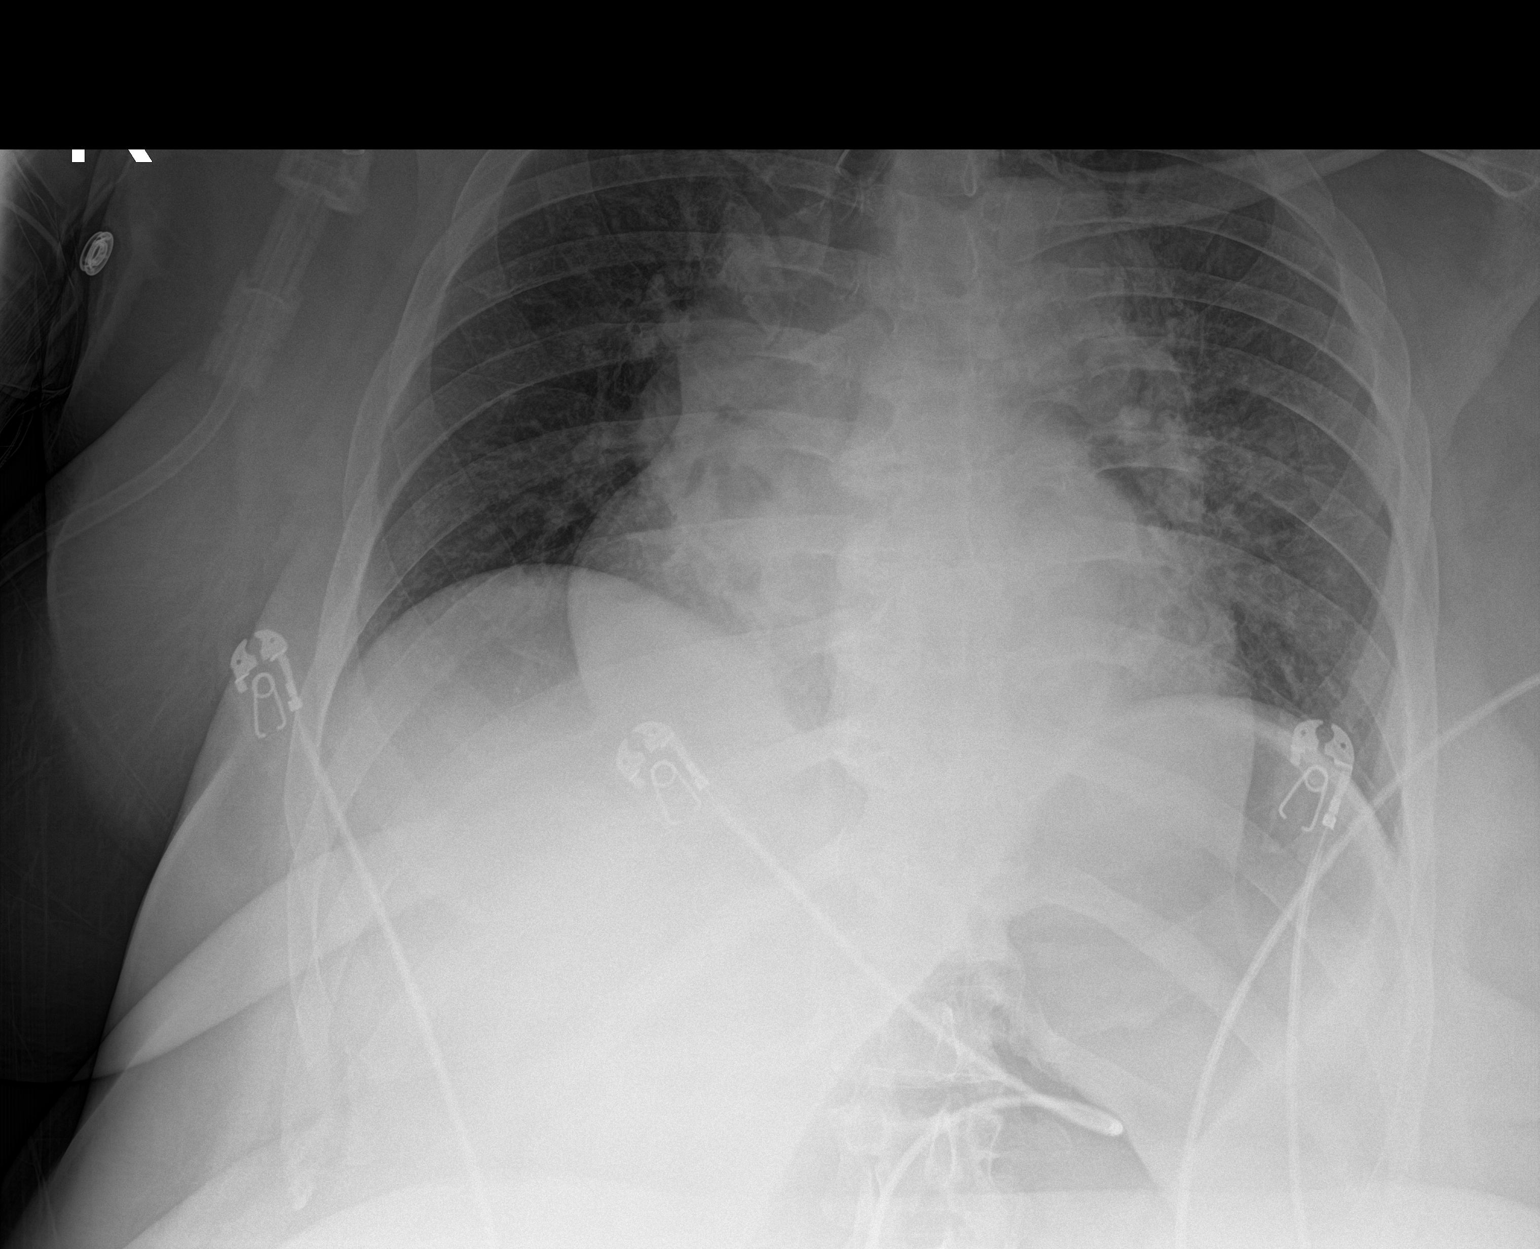

[1 of 1 positions shown; findings below may reference images not displayed]

FINDINGS: Endotracheal tube has been placed, tip approximately 3.4 centimeters
above the carina. The heart is enlarged. There is mild pulmonary
vascular congestion. Question of developing mild perihilar edema
versus perihilar infiltrate. The stomach is distended by air.
IMPRESSION: Interval placement of endotracheal tube.

Question developing perihilar edema or infectious process.

Gaseous distension of the stomach.

## 2020-03-26 IMAGING — CT CT HEAD CODE STROKE
3 series · 14 of 47 positions shown, 16 images · non-contrast
Comparison: CT head 08/14/2018 earlier today

CLINICAL DATA: Code stroke.

EXAM:
CT HEAD WITHOUT CONTRAST
TECHNIQUE: Contiguous axial images were obtained from the base of the skull
through the vertex without intravenous contrast.

[Series 3: head 5.0 h30s · axial · 0.39mm/px · z∈[-109,+21]mm · 8 of 32 slices shown, 10 images]
[im 3/32  brain]
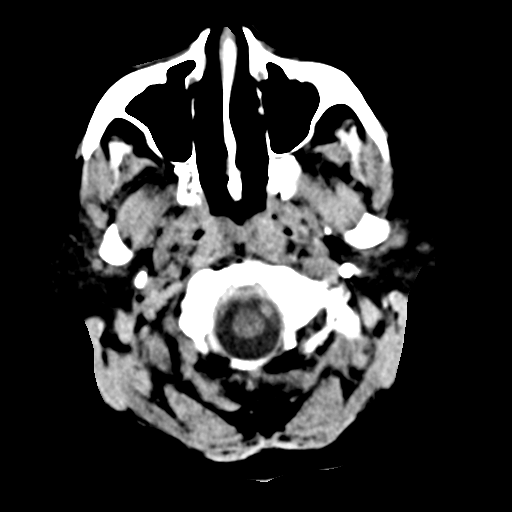
[im 3/32  bone]
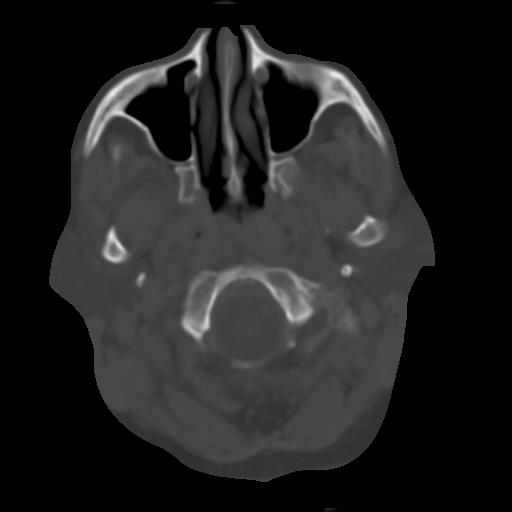
[im 7/32  brain]
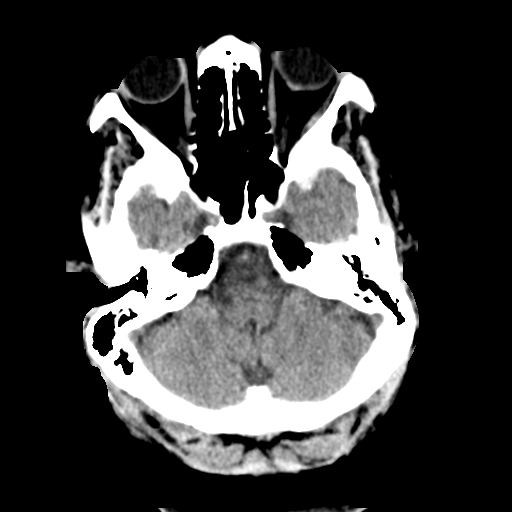
[im 10/32  brain]
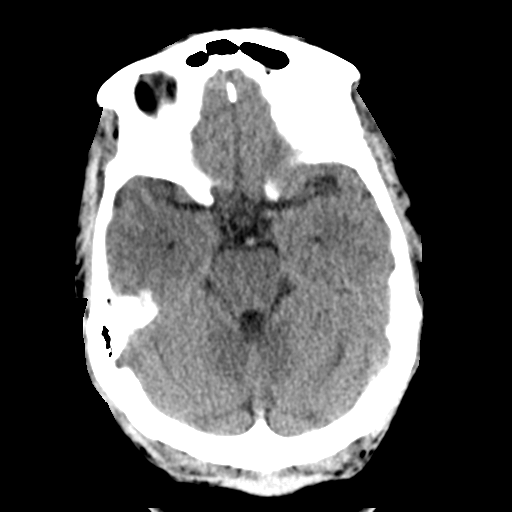
[im 14/32  brain]
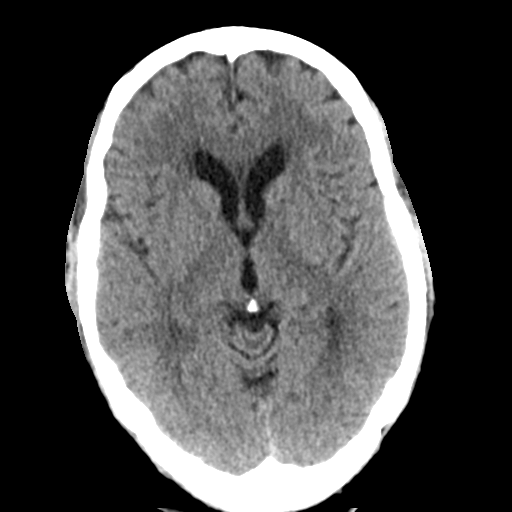
[im 18/32  brain]
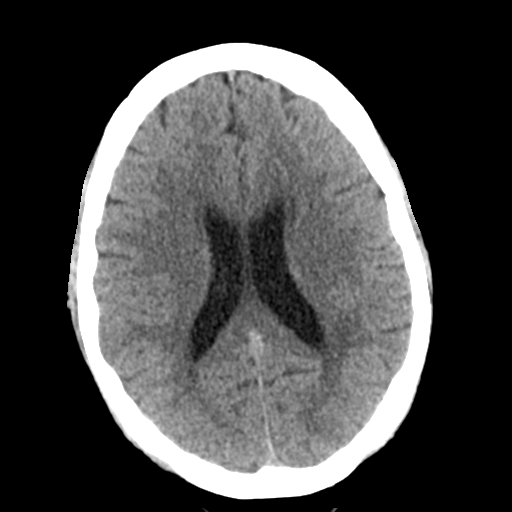
[im 18/32  bone]
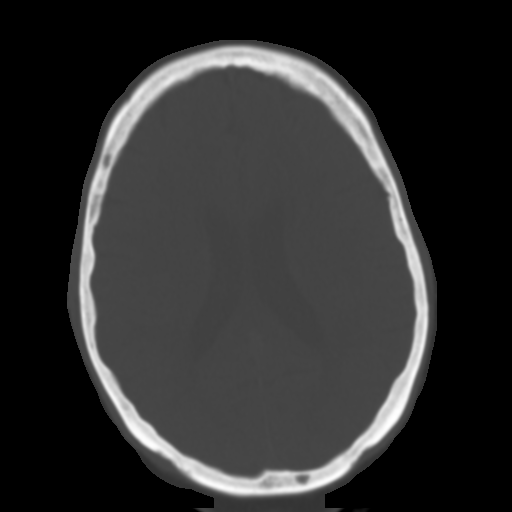
[im 22/32  brain]
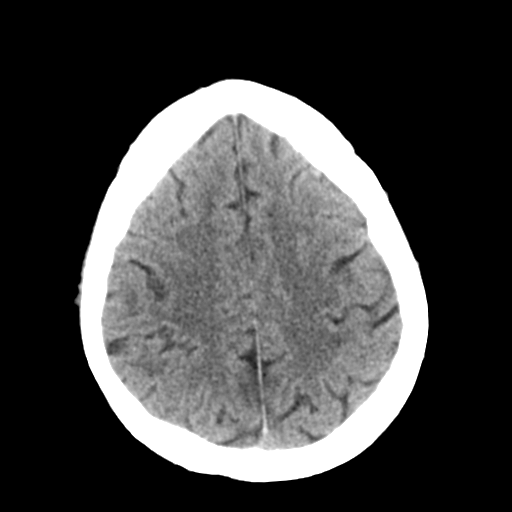
[im 25/32  brain]
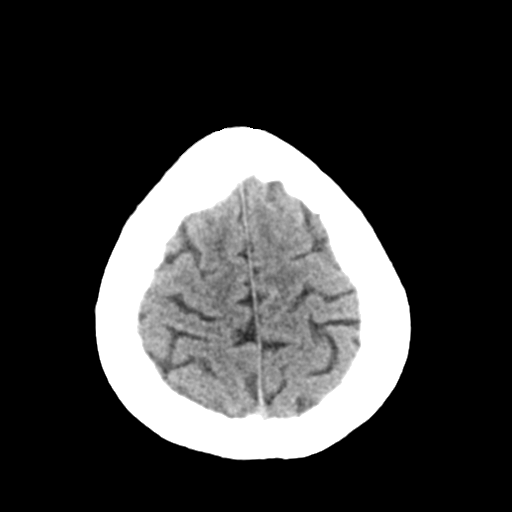
[im 29/32  brain]
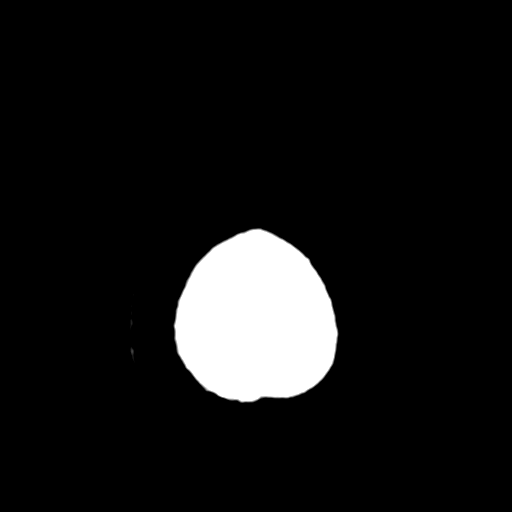

[Series 5: head 3.0 mpr cor · coronal · 0.30mm/px · 3 of 67 slices shown]
[im 23/67  brain]
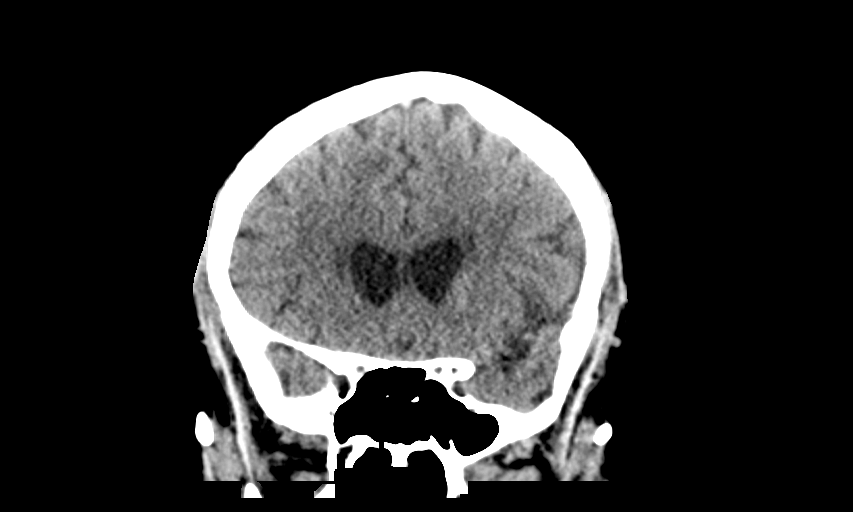
[im 30/67  brain]
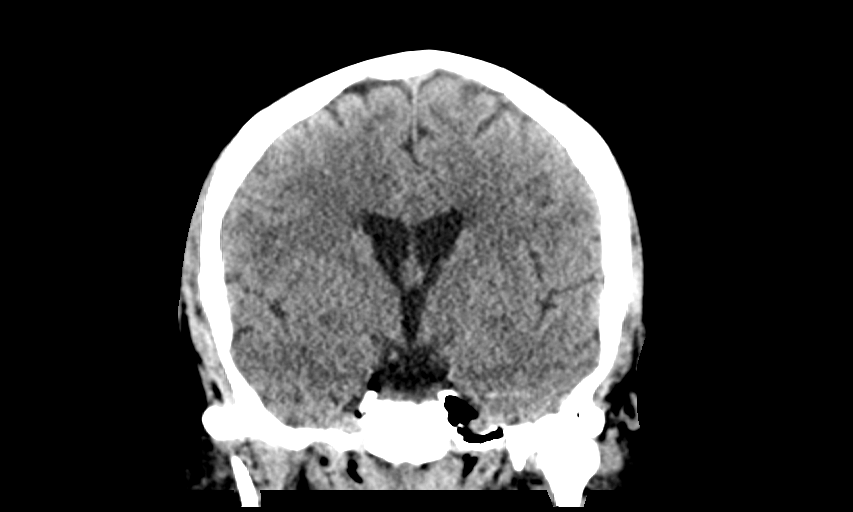
[im 37/67  brain]
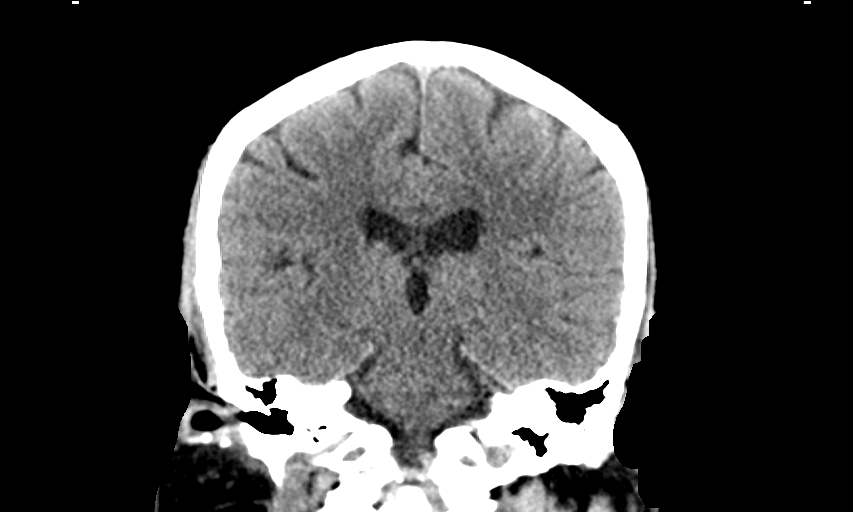

[Series 6: head 3.0 mpr sag · sagittal · 0.30mm/px · 3 of 67 slices shown]
[im 23/67  brain]
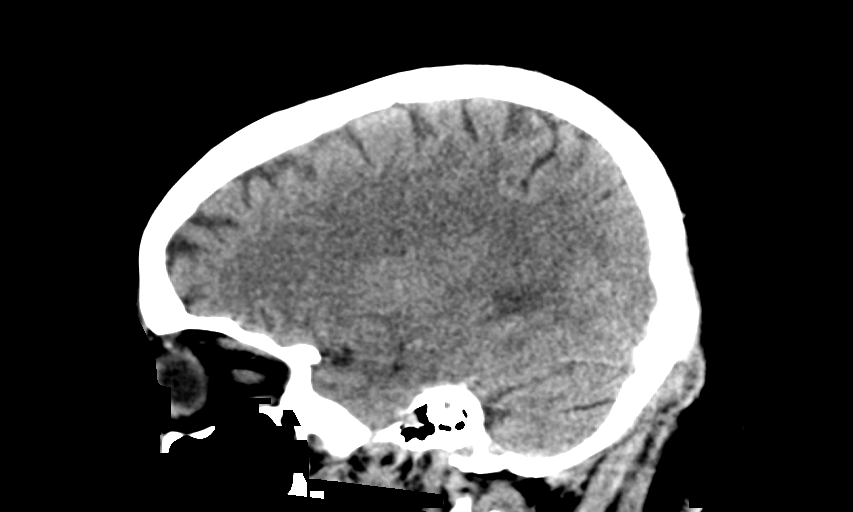
[im 34/67  brain]
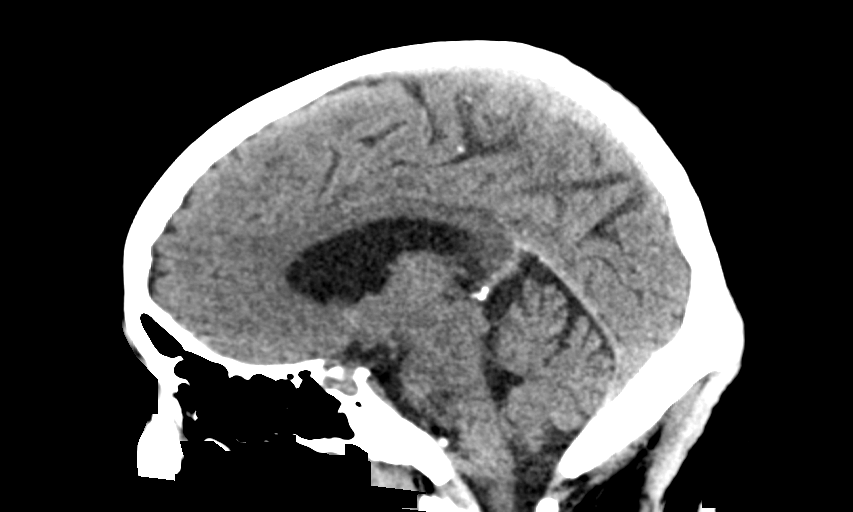
[im 45/67  brain]
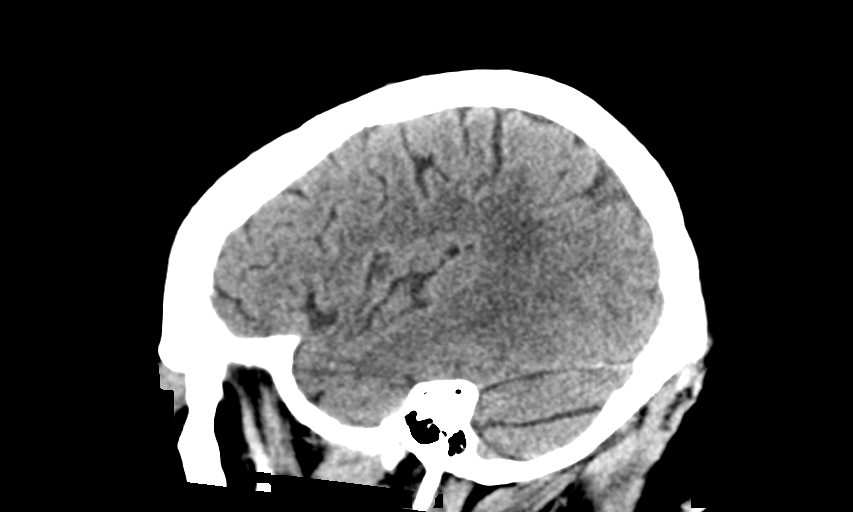

[14 of 47 positions shown; findings below may reference images not displayed]

FINDINGS: Brain: Patchy white matter hypodensity unchanged. No new areas of
hypodensity. No acute infarct, hemorrhage, mass. Ventricle size
normal.

Vascular: Negative for hyperdense vessel

Skull: Negative

Sinuses/Orbits: Negative

Other: None

ASPECTS (Alberta Stroke Program Early CT Score)

- Ganglionic level infarction (caudate, lentiform nuclei, internal
capsule, insula, M1-M3 cortex): 7

- Supraganglionic infarction (M4-M6 cortex): 3

Total score (0-10 with 10 being normal): 10
IMPRESSION: 1. No acute abnormality and no change from earlier today
2. ASPECTS is 10
3. These results were called by telephone at the time of
interpretation on 08/14/2018 at [DATE] to Dr. Adenyo, who verbally
acknowledged these results.
